# Patient Record
Sex: Female | Born: 1950 | Race: Black or African American | Hispanic: No | Smoking: Former smoker
Health system: Southern US, Community
[De-identification: ages and names within clinical notes are randomized; demographics above are authoritative.]

## PROBLEM LIST (undated history)

## (undated) DIAGNOSIS — IMO0002 Reserved for concepts with insufficient information to code with codable children: Secondary | ICD-10-CM

## (undated) DIAGNOSIS — I10 Essential (primary) hypertension: Secondary | ICD-10-CM

## (undated) DIAGNOSIS — J439 Emphysema, unspecified: Secondary | ICD-10-CM

## (undated) DIAGNOSIS — E1165 Type 2 diabetes mellitus with hyperglycemia: Secondary | ICD-10-CM

## (undated) DIAGNOSIS — R32 Unspecified urinary incontinence: Secondary | ICD-10-CM

## (undated) DIAGNOSIS — E039 Hypothyroidism, unspecified: Secondary | ICD-10-CM

## (undated) DIAGNOSIS — J309 Allergic rhinitis, unspecified: Secondary | ICD-10-CM

## (undated) DIAGNOSIS — F329 Major depressive disorder, single episode, unspecified: Secondary | ICD-10-CM

## (undated) DIAGNOSIS — K219 Gastro-esophageal reflux disease without esophagitis: Secondary | ICD-10-CM

## (undated) DIAGNOSIS — F32A Depression, unspecified: Secondary | ICD-10-CM

## (undated) DIAGNOSIS — I639 Cerebral infarction, unspecified: Secondary | ICD-10-CM

## (undated) HISTORY — DX: Gastro-esophageal reflux disease without esophagitis: K21.9

## (undated) HISTORY — DX: Major depressive disorder, single episode, unspecified: F32.9

## (undated) HISTORY — DX: Emphysema, unspecified: J43.9

## (undated) HISTORY — DX: Depression, unspecified: F32.A

## (undated) HISTORY — DX: Unspecified urinary incontinence: R32

## (undated) HISTORY — DX: Allergic rhinitis, unspecified: J30.9

## (undated) HISTORY — DX: Hypothyroidism, unspecified: E03.9

## (undated) HISTORY — PX: ABDOMINAL HYSTERECTOMY: SHX81

---

## 1998-06-09 ENCOUNTER — Emergency Department (HOSPITAL_COMMUNITY): Admission: EM | Admit: 1998-06-09 | Discharge: 1998-06-09 | Payer: Self-pay | Admitting: Emergency Medicine

## 1999-03-06 ENCOUNTER — Observation Stay (HOSPITAL_COMMUNITY): Admission: AD | Admit: 1999-03-06 | Discharge: 1999-03-07 | Payer: Self-pay | Admitting: Obstetrics

## 1999-06-08 ENCOUNTER — Inpatient Hospital Stay (HOSPITAL_COMMUNITY): Admission: RE | Admit: 1999-06-08 | Discharge: 1999-06-10 | Payer: Self-pay | Admitting: Obstetrics & Gynecology

## 1999-06-17 ENCOUNTER — Encounter: Payer: Self-pay | Admitting: Obstetrics and Gynecology

## 1999-06-17 ENCOUNTER — Inpatient Hospital Stay (HOSPITAL_COMMUNITY): Admission: AD | Admit: 1999-06-17 | Discharge: 1999-06-25 | Payer: Self-pay | Admitting: Obstetrics and Gynecology

## 1999-06-18 ENCOUNTER — Encounter: Payer: Self-pay | Admitting: Obstetrics and Gynecology

## 1999-06-20 ENCOUNTER — Encounter: Payer: Self-pay | Admitting: Obstetrics & Gynecology

## 1999-06-21 ENCOUNTER — Encounter: Payer: Self-pay | Admitting: Obstetrics & Gynecology

## 1999-06-22 ENCOUNTER — Encounter: Payer: Self-pay | Admitting: Obstetrics and Gynecology

## 1999-06-23 ENCOUNTER — Encounter: Payer: Self-pay | Admitting: Obstetrics and Gynecology

## 1999-06-24 ENCOUNTER — Encounter: Payer: Self-pay | Admitting: Obstetrics and Gynecology

## 2004-02-26 ENCOUNTER — Emergency Department (HOSPITAL_COMMUNITY): Admission: EM | Admit: 2004-02-26 | Discharge: 2004-02-26 | Payer: Self-pay | Admitting: Emergency Medicine

## 2006-05-31 ENCOUNTER — Inpatient Hospital Stay (HOSPITAL_COMMUNITY): Admission: EM | Admit: 2006-05-31 | Discharge: 2006-06-01 | Payer: Self-pay | Admitting: Emergency Medicine

## 2013-10-09 ENCOUNTER — Inpatient Hospital Stay (HOSPITAL_BASED_OUTPATIENT_CLINIC_OR_DEPARTMENT_OTHER)
Admission: EM | Admit: 2013-10-09 | Discharge: 2013-10-12 | DRG: 190 | Disposition: A | Discharge status: Home / Self Care | Payer: Medicare Other | Attending: Internal Medicine | Admitting: Internal Medicine

## 2013-10-09 ENCOUNTER — Emergency Department (HOSPITAL_BASED_OUTPATIENT_CLINIC_OR_DEPARTMENT_OTHER): Payer: Medicare Other

## 2013-10-09 ENCOUNTER — Encounter (HOSPITAL_BASED_OUTPATIENT_CLINIC_OR_DEPARTMENT_OTHER): Payer: Self-pay | Admitting: Emergency Medicine

## 2013-10-09 DIAGNOSIS — T380X5A Adverse effect of glucocorticoids and synthetic analogues, initial encounter: Secondary | ICD-10-CM | POA: Diagnosis present

## 2013-10-09 DIAGNOSIS — N179 Acute kidney failure, unspecified: Secondary | ICD-10-CM | POA: Diagnosis present

## 2013-10-09 DIAGNOSIS — Z79899 Other long term (current) drug therapy: Secondary | ICD-10-CM

## 2013-10-09 DIAGNOSIS — J441 Chronic obstructive pulmonary disease with (acute) exacerbation: Secondary | ICD-10-CM

## 2013-10-09 DIAGNOSIS — Z6841 Body Mass Index (BMI) 40.0 and over, adult: Secondary | ICD-10-CM

## 2013-10-09 DIAGNOSIS — R7309 Other abnormal glucose: Secondary | ICD-10-CM | POA: Diagnosis present

## 2013-10-09 DIAGNOSIS — I10 Essential (primary) hypertension: Secondary | ICD-10-CM | POA: Diagnosis present

## 2013-10-09 DIAGNOSIS — Z8249 Family history of ischemic heart disease and other diseases of the circulatory system: Secondary | ICD-10-CM

## 2013-10-09 DIAGNOSIS — E662 Morbid (severe) obesity with alveolar hypoventilation: Secondary | ICD-10-CM | POA: Diagnosis present

## 2013-10-09 DIAGNOSIS — R0902 Hypoxemia: Secondary | ICD-10-CM | POA: Diagnosis present

## 2013-10-09 DIAGNOSIS — J40 Bronchitis, not specified as acute or chronic: Secondary | ICD-10-CM | POA: Diagnosis present

## 2013-10-09 DIAGNOSIS — E876 Hypokalemia: Secondary | ICD-10-CM | POA: Diagnosis present

## 2013-10-09 DIAGNOSIS — J9601 Acute respiratory failure with hypoxia: Secondary | ICD-10-CM | POA: Diagnosis present

## 2013-10-09 DIAGNOSIS — T46905A Adverse effect of unspecified agents primarily affecting the cardiovascular system, initial encounter: Secondary | ICD-10-CM | POA: Diagnosis present

## 2013-10-09 DIAGNOSIS — J440 Chronic obstructive pulmonary disease with acute lower respiratory infection: Principal | ICD-10-CM | POA: Diagnosis present

## 2013-10-09 DIAGNOSIS — J9600 Acute respiratory failure, unspecified whether with hypoxia or hypercapnia: Secondary | ICD-10-CM | POA: Diagnosis present

## 2013-10-09 DIAGNOSIS — Z87891 Personal history of nicotine dependence: Secondary | ICD-10-CM

## 2013-10-09 DIAGNOSIS — J209 Acute bronchitis, unspecified: Principal | ICD-10-CM | POA: Diagnosis present

## 2013-10-09 HISTORY — DX: Essential (primary) hypertension: I10

## 2013-10-09 LAB — CBC WITH DIFFERENTIAL/PLATELET
BASOS ABS: 0 10*3/uL (ref 0.0–0.1)
Basophils Absolute: 0 10*3/uL (ref 0.0–0.1)
Basophils Relative: 0 % (ref 0–1)
Basophils Relative: 0 % (ref 0–1)
EOS ABS: 0.1 10*3/uL (ref 0.0–0.7)
EOS PCT: 2 % (ref 0–5)
Eosinophils Absolute: 0 10*3/uL (ref 0.0–0.7)
Eosinophils Relative: 0 % (ref 0–5)
HCT: 45.8 % (ref 36.0–46.0)
HEMATOCRIT: 45.3 % (ref 36.0–46.0)
Hemoglobin: 15.6 g/dL — ABNORMAL HIGH (ref 12.0–15.0)
Hemoglobin: 15.9 g/dL — ABNORMAL HIGH (ref 12.0–15.0)
LYMPHS PCT: 35 % (ref 12–46)
LYMPHS PCT: 23 % (ref 12–46)
Lymphs Abs: 2.8 10*3/uL (ref 0.7–4.0)
Lymphs Abs: 1.8 10*3/uL (ref 0.7–4.0)
MCH: 34.1 pg — AB (ref 26.0–34.0)
MCH: 35 pg — ABNORMAL HIGH (ref 26.0–34.0)
MCHC: 34.1 g/dL (ref 30.0–36.0)
MCHC: 35.1 g/dL (ref 30.0–36.0)
MCV: 100.2 fL — AB (ref 78.0–100.0)
MCV: 99.8 fL (ref 78.0–100.0)
Monocytes Absolute: 0.6 10*3/uL (ref 0.1–1.0)
Monocytes Absolute: 0 10*3/uL — ABNORMAL LOW (ref 0.1–1.0)
Monocytes Relative: 7 % (ref 3–12)
Monocytes Relative: 0 % — ABNORMAL LOW (ref 3–12)
NEUTROS ABS: 5.9 10*3/uL (ref 1.7–7.7)
NEUTROS PCT: 56 % (ref 43–77)
Neutro Abs: 4.4 10*3/uL (ref 1.7–7.7)
Neutrophils Relative %: 76 % (ref 43–77)
PLATELETS: 166 10*3/uL (ref 150–400)
PLATELETS: 182 10*3/uL (ref 150–400)
RBC: 4.57 MIL/uL (ref 3.87–5.11)
RBC: 4.54 MIL/uL (ref 3.87–5.11)
RDW: 12.7 % (ref 11.5–15.5)
RDW: 13.4 % (ref 11.5–15.5)
WBC: 8 10*3/uL (ref 4.0–10.5)
WBC: 7.7 10*3/uL (ref 4.0–10.5)

## 2013-10-09 LAB — BASIC METABOLIC PANEL
BUN: 10 mg/dL (ref 6–23)
CALCIUM: 8.9 mg/dL (ref 8.4–10.5)
CO2: 29 mEq/L (ref 19–32)
Chloride: 98 mEq/L (ref 96–112)
Creatinine, Ser: 1.2 mg/dL — ABNORMAL HIGH (ref 0.50–1.10)
GFR calc Af Amer: 55 mL/min — ABNORMAL LOW (ref >90)
GFR, EST NON AFRICAN AMERICAN: 48 mL/min — AB (ref >90)
Glucose, Bld: 110 mg/dL — ABNORMAL HIGH (ref 70–99)
POTASSIUM: 3.4 meq/L — AB (ref 3.7–5.3)
SODIUM: 140 meq/L (ref 137–147)

## 2013-10-09 LAB — COMPREHENSIVE METABOLIC PANEL
ALT: 24 U/L (ref 0–35)
AST: 46 U/L — AB (ref 0–37)
Albumin: 3.2 g/dL — ABNORMAL LOW (ref 3.5–5.2)
Alkaline Phosphatase: 88 U/L (ref 39–117)
BUN: 12 mg/dL (ref 6–23)
CALCIUM: 8.6 mg/dL (ref 8.4–10.5)
CO2: 25 meq/L (ref 19–32)
CREATININE: 1.1 mg/dL (ref 0.50–1.10)
Chloride: 95 mEq/L — ABNORMAL LOW (ref 96–112)
GFR, EST AFRICAN AMERICAN: 61 mL/min — AB (ref >90)
GFR, EST NON AFRICAN AMERICAN: 53 mL/min — AB (ref >90)
Glucose, Bld: 229 mg/dL — ABNORMAL HIGH (ref 70–99)
Potassium: 3.3 mEq/L — ABNORMAL LOW (ref 3.7–5.3)
Sodium: 135 mEq/L — ABNORMAL LOW (ref 137–147)
TOTAL PROTEIN: 9.4 g/dL — AB (ref 6.0–8.3)
Total Bilirubin: 0.7 mg/dL (ref 0.3–1.2)

## 2013-10-09 LAB — MAGNESIUM: Magnesium: 1.4 mg/dL — ABNORMAL LOW (ref 1.5–2.5)

## 2013-10-09 LAB — TROPONIN I

## 2013-10-09 LAB — APTT: APTT: 31 s (ref 24–37)

## 2013-10-09 LAB — PROTIME-INR
INR: 1.15 (ref 0.00–1.49)
Prothrombin Time: 14.5 seconds (ref 11.6–15.2)

## 2013-10-09 LAB — PHOSPHORUS: PHOSPHORUS: 1.6 mg/dL — AB (ref 2.3–4.6)

## 2013-10-09 LAB — PRO B NATRIURETIC PEPTIDE: PRO B NATRI PEPTIDE: 67.2 pg/mL (ref 0–125)

## 2013-10-09 MED ORDER — DEXTROSE 5 % IV SOLN
1.0000 g | Freq: Once | INTRAVENOUS | Status: AC
  Administered 2013-10-09: 1 g via INTRAVENOUS

## 2013-10-09 MED ORDER — DEXTROSE 5 % IV SOLN
500.0000 mg | INTRAVENOUS | Status: DC
  Administered 2013-10-09 – 2013-10-10 (×2): 500 mg via INTRAVENOUS
  Filled 2013-10-09 (×3): qty 500

## 2013-10-09 MED ORDER — ALBUTEROL SULFATE (2.5 MG/3ML) 0.083% IN NEBU: 5.0000 mg | INHALATION_SOLUTION | Freq: Once | RESPIRATORY_TRACT | Status: DC

## 2013-10-09 MED ORDER — METHYLPREDNISOLONE SODIUM SUCC 125 MG IJ SOLR
60.0000 mg | Freq: Every day | INTRAMUSCULAR | Status: DC
  Administered 2013-10-09 – 2013-10-10 (×2): 60 mg via INTRAVENOUS
  Filled 2013-10-09 (×2): qty 0.96

## 2013-10-09 MED ORDER — HYDROCODONE-ACETAMINOPHEN 5-325 MG PO TABS: 1.0000 | ORAL_TABLET | ORAL | Status: DC | PRN

## 2013-10-09 MED ORDER — IPRATROPIUM BROMIDE 0.02 % IN SOLN
0.5000 mg | Freq: Four times a day (QID) | RESPIRATORY_TRACT | Status: DC
  Administered 2013-10-09: 0.5 mg via RESPIRATORY_TRACT
  Filled 2013-10-09: qty 2.5

## 2013-10-09 MED ORDER — IPRATROPIUM BROMIDE 0.02 % IN SOLN: 0.5000 mg | RESPIRATORY_TRACT | Status: DC | PRN

## 2013-10-09 MED ORDER — ACETAMINOPHEN 650 MG RE SUPP: 650.0000 mg | Freq: Four times a day (QID) | RECTAL | Status: DC | PRN

## 2013-10-09 MED ORDER — ONDANSETRON HCL 4 MG/2ML IJ SOLN: 4.0000 mg | Freq: Four times a day (QID) | INTRAMUSCULAR | Status: DC | PRN

## 2013-10-09 MED ORDER — GUAIFENESIN 100 MG/5ML PO SYRP
300.0000 mg | ORAL_SOLUTION | Freq: Every day | ORAL | Status: DC | PRN
  Administered 2013-10-09 – 2013-10-10 (×2): 300 mg via ORAL
  Filled 2013-10-09 (×3): qty 15

## 2013-10-09 MED ORDER — OSELTAMIVIR PHOSPHATE 75 MG PO CAPS
75.0000 mg | ORAL_CAPSULE | Freq: Once | ORAL | Status: AC
  Administered 2013-10-09: 75 mg via ORAL
  Filled 2013-10-09: qty 1

## 2013-10-09 MED ORDER — ZOLPIDEM TARTRATE 5 MG PO TABS
5.0000 mg | ORAL_TABLET | Freq: Every day | ORAL | Status: DC
  Administered 2013-10-09 – 2013-10-11 (×3): 5 mg via ORAL
  Filled 2013-10-09 (×3): qty 1

## 2013-10-09 MED ORDER — IPRATROPIUM-ALBUTEROL 0.5-2.5 (3) MG/3ML IN SOLN
3.0000 mL | Freq: Three times a day (TID) | RESPIRATORY_TRACT | Status: DC
  Administered 2013-10-10 – 2013-10-11 (×3): 3 mL via RESPIRATORY_TRACT
  Filled 2013-10-09 (×6): qty 3

## 2013-10-09 MED ORDER — AZITHROMYCIN 250 MG PO TABS
1000.0000 mg | ORAL_TABLET | Freq: Once | ORAL | Status: AC
  Administered 2013-10-09: 1000 mg via ORAL
  Filled 2013-10-09: qty 4

## 2013-10-09 MED ORDER — ALBUTEROL SULFATE (2.5 MG/3ML) 0.083% IN NEBU
2.5000 mg | INHALATION_SOLUTION | Freq: Four times a day (QID) | RESPIRATORY_TRACT | Status: DC
  Administered 2013-10-09: 2.5 mg via RESPIRATORY_TRACT
  Filled 2013-10-09: qty 3

## 2013-10-09 MED ORDER — SODIUM CHLORIDE 0.9 % IJ SOLN
3.0000 mL | Freq: Two times a day (BID) | INTRAMUSCULAR | Status: DC
  Administered 2013-10-10 – 2013-10-11 (×3): 3 mL via INTRAVENOUS

## 2013-10-09 MED ORDER — PREDNISONE 50 MG PO TABS: 60.0000 mg | ORAL_TABLET | Freq: Once | ORAL | Status: DC

## 2013-10-09 MED ORDER — SODIUM CHLORIDE 0.9 % IV SOLN
INTRAVENOUS | Status: AC
  Administered 2013-10-09: 75 mL/h via INTRAVENOUS

## 2013-10-09 MED ORDER — GUAIFENESIN ER 600 MG PO TB12
600.0000 mg | ORAL_TABLET | Freq: Two times a day (BID) | ORAL | Status: DC
  Administered 2013-10-09 – 2013-10-12 (×6): 600 mg via ORAL
  Filled 2013-10-09 (×7): qty 1

## 2013-10-09 MED ORDER — ALPRAZOLAM 0.5 MG PO TABS
2.0000 mg | ORAL_TABLET | Freq: Every day | ORAL | Status: DC
  Administered 2013-10-09 – 2013-10-11 (×3): 2 mg via ORAL
  Filled 2013-10-09 (×3): qty 4

## 2013-10-09 MED ORDER — ACETAMINOPHEN 325 MG PO TABS: 650.0000 mg | ORAL_TABLET | Freq: Four times a day (QID) | ORAL | Status: DC | PRN

## 2013-10-09 MED ORDER — ZOLPIDEM TARTRATE 5 MG PO TABS: 10.0000 mg | ORAL_TABLET | Freq: Every day | ORAL | Status: DC

## 2013-10-09 MED ORDER — PREDNISONE 50 MG PO TABS
60.0000 mg | ORAL_TABLET | Freq: Once | ORAL | Status: AC
  Administered 2013-10-09: 60 mg via ORAL
  Filled 2013-10-09 (×2): qty 1

## 2013-10-09 MED ORDER — METOPROLOL SUCCINATE ER 100 MG PO TB24
100.0000 mg | ORAL_TABLET | Freq: Every morning | ORAL | Status: DC
  Administered 2013-10-10 – 2013-10-12 (×3): 100 mg via ORAL
  Filled 2013-10-09 (×3): qty 1

## 2013-10-09 MED ORDER — MORPHINE SULFATE 2 MG/ML IJ SOLN: 1.0000 mg | INTRAMUSCULAR | Status: DC | PRN

## 2013-10-09 MED ORDER — BIOTENE DRY MOUTH MT LIQD
15.0000 mL | Freq: Two times a day (BID) | OROMUCOSAL | Status: DC
  Administered 2013-10-09 – 2013-10-12 (×3): 15 mL via OROMUCOSAL

## 2013-10-09 MED ORDER — OSELTAMIVIR PHOSPHATE 75 MG PO CAPS
75.0000 mg | ORAL_CAPSULE | Freq: Two times a day (BID) | ORAL | Status: DC
  Administered 2013-10-09 – 2013-10-10 (×2): 75 mg via ORAL
  Filled 2013-10-09 (×4): qty 1

## 2013-10-09 MED ORDER — ALBUTEROL SULFATE (2.5 MG/3ML) 0.083% IN NEBU
5.0000 mg | INHALATION_SOLUTION | Freq: Once | RESPIRATORY_TRACT | Status: AC
  Administered 2013-10-09: 5 mg via RESPIRATORY_TRACT
  Filled 2013-10-09: qty 6

## 2013-10-09 MED ORDER — ALBUTEROL SULFATE (2.5 MG/3ML) 0.083% IN NEBU
2.5000 mg | INHALATION_SOLUTION | RESPIRATORY_TRACT | Status: DC | PRN
Start: 2013-10-09 — End: 2013-10-12
  Administered 2013-10-12: 2.5 mg via RESPIRATORY_TRACT
  Filled 2013-10-09: qty 3

## 2013-10-09 MED ORDER — DEXTROSE 5 % IV SOLN
1.0000 g | INTRAVENOUS | Status: DC
  Administered 2013-10-10 – 2013-10-11 (×2): 1 g via INTRAVENOUS
  Filled 2013-10-09 (×4): qty 10

## 2013-10-09 MED ORDER — ONDANSETRON HCL 4 MG PO TABS: 4.0000 mg | ORAL_TABLET | Freq: Four times a day (QID) | ORAL | Status: DC | PRN

## 2013-10-09 MED ORDER — CEFTRIAXONE SODIUM 1 G IJ SOLR
INTRAMUSCULAR | Status: AC
  Filled 2013-10-09: qty 10

## 2013-10-09 NOTE — ED Notes (Signed)
Patient transported to X-ray 

## 2013-10-09 NOTE — ED Notes (Signed)
Pt amb to room 5 with quick steady gait in nad. Initial room air sats in mid 80's, rt at bedside, placed on o2 at 2lpm, sats increase to 99%. Pt states she has had productive cough x 3 weeks, "it seemed to get better, then came back with a vengeance".

## 2013-10-09 NOTE — ED Provider Notes (Signed)
CSN: [id number]     Arrival date & time 10/09/13  1039 History   First MD Initiated Contact with Patient 10/09/13 1057     Chief Complaint  Patient presents with  . Cough  . Shortness of Breath   (Consider location/radiation/quality/duration/timing/severity/associated sxs/prior Treatment) Patient is a 63 y.o. female presenting with cough and shortness of breath.  Cough Associated symptoms: shortness of breath   Shortness of Breath Associated symptoms: cough    Pt with history of COPD, but only using a rescue inhaler in general reports 3 weeks of URI symptoms that initially improved but then worsened 3-4 days ago, now with cough productive of thick green sputum, subjective fevers and SOB. Her symptoms are worse at night and with walking. Denies CP, has not been to PCP for same. Multiple other family members have had similar symptoms recently but hers have been the worst.   Past Medical History  Diagnosis Date  . Hypertension   . Emphysema/COPD    History reviewed. No pertinent past surgical history. History reviewed. No pertinent family history. History  Substance Use Topics  . Smoking status: Former Research scientist (life sciences)  . Smokeless tobacco: Not on file  . Alcohol Use: Not on file   OB History   Grav Para Term Preterm Abortions TAB SAB Ect Mult Living                 Review of Systems  Respiratory: Positive for cough and shortness of breath.    All other systems reviewed and are negative except as noted in HPI.   Allergies  Review of patient's allergies indicates no known allergies.  Home Medications   Current Outpatient Rx  Name  Route  Sig  Dispense  Refill  . ALPRAZolam (XANAX) 1 MG tablet   Oral   Take 1 mg by mouth 2 (two) times daily as needed for anxiety.         Marland Kitchen lisinopril-hydrochlorothiazide (PRINZIDE,ZESTORETIC) 10-12.5 MG per tablet   Oral   Take 1 tablet by mouth daily.         . metoprolol succinate (TOPROL-XL) 50 MG 24 hr tablet   Oral   Take 50 mg by  mouth daily. Take with or immediately following a meal.         . zolpidem (AMBIEN) 10 MG tablet   Oral   Take 10 mg by mouth at bedtime as needed for sleep.          BP 150/91  Pulse 79  Temp(Src) 99.5 F (37.5 C) (Oral)  Resp 22  Ht 5\' 3"  (1.6 m)  Wt 290 lb (131.543 kg)  BMI 51.38 kg/m2  SpO2 97% Physical Exam  Nursing note and vitals reviewed. Constitutional: She is oriented to person, place, and time. She appears well-developed and well-nourished.  HENT:  Head: Normocephalic and atraumatic.  Eyes: EOM are normal. Pupils are equal, round, and reactive to light.  Neck: Normal range of motion. Neck supple.  Cardiovascular: Normal rate, normal heart sounds and intact distal pulses.   Pulmonary/Chest: Effort normal and breath sounds normal. No respiratory distress. She has no wheezes. She has no rales.  Diminished lung sounds  Abdominal: Bowel sounds are normal. She exhibits no distension. There is no tenderness.  Musculoskeletal: Normal range of motion. She exhibits no edema and no tenderness.  Neurological: She is alert and oriented to person, place, and time. She has normal strength. No cranial nerve deficit or sensory deficit.  Skin: Skin is warm and dry. No  rash noted.  Psychiatric: She has a normal mood and affect.    ED Course  Procedures (including critical care time) Labs Review Labs Reviewed  CBC WITH DIFFERENTIAL - Abnormal; Notable for the following:    Hemoglobin 15.6 (*)    MCV 100.2 (*)    MCH 34.1 (*)    All other components within normal limits  BASIC METABOLIC PANEL - Abnormal; Notable for the following:    Potassium 3.4 (*)    Glucose, Bld 110 (*)    Creatinine, Ser 1.20 (*)    GFR calc non Af Amer 48 (*)    GFR calc Af Amer 55 (*)    All other components within normal limits  TROPONIN I  PRO B NATRIURETIC PEPTIDE  INFLUENZA PANEL BY PCR (TYPE A & B, H1N1)   Imaging Review Dg Chest 2 View  10/09/2013   CLINICAL DATA:  SOB, cough, hypoxia   EXAM: CHEST  2 VIEW  COMPARISON:  DG CHEST 1V PORT dated 05/31/2006  FINDINGS: The heart size and mediastinal contours are within normal limits. Both lungs are clear. The visualized skeletal structures are unremarkable.  IMPRESSION: No active cardiopulmonary disease.   Electronically Signed   By: Kathreen Devoid   On: 10/09/2013 11:31    EKG Interpretation    Date/Time:  Wednesday October 09 2013 11:08:53 EST Ventricular Rate:  80 PR Interval:  196 QRS Duration: 86 QT Interval:  404 QTC Calculation: 465 R Axis:   73 Text Interpretation:  Sinus rhythm with frequent Premature ventricular complexes Otherwise normal ECG Since last tracing Premature ventricular complexes NOW PRESENT T wave inversion improved  Confirmed by Darol Cush  MD, Malayiah Mcbrayer ([id number]) on 10/09/2013 11:50:59 AM            MDM   1. Bronchitis   2. Hypoxia     Pt with continued hypoxia despite essentially negative labs and imaging. Given a neb and prednisone, will treat empirically with Abx, admit for further eval.     Kewana Sanon B. Karle Starch, MD 10/09/13 1517

## 2013-10-09 NOTE — Progress Notes (Signed)
ANTIBIOTIC CONSULT NOTE - INITIAL  Pharmacy Consult for Antibiotic renal dose adjustment.   Indication: Pneumonia   No Known Allergies  Patient Measurements: Height: 5\' 3"  (160 cm) Weight: 287 lb 7.7 oz (130.4 kg) IBW/kg (Calculated) : 52.4  Vital Signs: Temp: 98.7 F (37.1 C) (01/28 1800) Temp src: Oral (01/28 1800) BP: 127/79 mmHg (01/28 1800) Pulse Rate: 79 (01/28 1800) Intake/Output from previous day:   Intake/Output from this shift:    Labs:  Recent Labs  10/09/13 1135  WBC 8.0  HGB 15.6*  PLT 166  CREATININE 1.20*   Estimated Creatinine Clearance: 65 ml/min (by C-G formula based on Cr of 1.2). No results found for this basename: VANCOTROUGH, VANCOPEAK, VANCORANDOM, GENTTROUGH, GENTPEAK, GENTRANDOM, TOBRATROUGH, TOBRAPEAK, TOBRARND, AMIKACINPEAK, AMIKACINTROU, AMIKACIN,  in the last 72 hours   Microbiology: No results found for this or any previous visit (from the past 720 hour(s)).  Medical History: Past Medical History  Diagnosis Date  . Hypertension   . Emphysema/COPD    Assessment: 81 YOF presented with worsening SOB and productive cough and started on azithromycin 500mg  IV Q 24 hrs and rocephin 1g IV Q 24 hrs x 7 days, Pharmacy is consulted for antibiotic renal dose adjustment.    Goal of Therapy:  Elimination of infection  Plan:  Continue azithromycin and rocephin as ordered, no renal dose adjustment required for either of the antibiotics Pharmacy sign off, please re-consult if other antibiotics are needed.  Thanks.  Maryanna Shape, PharmD, BCPS  Clinical Pharmacist  Pager: [phone]   10/09/2013,7:10 PM

## 2013-10-09 NOTE — H&P (Signed)
[facility] History and Physical  Sue [name] [id number] DOB: 1951/07/28 DOA: 10/09/2013  Referring physician: ED physician PCP: No PCP Per Patient   Chief Complaint: shortness of breath  HPI:  63 year old female with past medical history of hypertension, anxiety who presented to [facility] on 10/09/2013 with worsening shortness of breath and associated productive cough ongoing for past few weeks prior to this admission. Pt reported having copious amount of greenish sputum and additional subjective fevers at home. No associated chest pain, palpitations. No abdominal pain, nausea or vomiting. No lightheadedness or loss of consciousness.  In ED, BP was 127/79, HR 79, Tmax 99.5 F and oxygen saturation of 83% on room air. Oxygen saturation improved to 99% with 2 L [place] oxygen support. CBC unremarkable and BMP showed potassium of 3.4 and creatinine of 1.20. CXR did not show acute cardiopulmonary disease.  Assessment and Plan:  Principal Problem:   Acute respiratory failure with hypoxia - likely due to upper respiratory tract infection, viral or bacterial pneumonia - pneumonia order set in place - started azithromycin and rocephin - started tamiflu until influenza PCR results back - follow up blood culture results, legionella, strep pneumonia, resp culture results - oxygen support via nasal canula to keep O2 saturation above 90% - BD scheduled and as needed Active Problems:   Acute renal failure - likely due to prinizide which we held on admission - follow up BMP ina m   HTN (hypertension) - hold prinizide and continue metoprolol   Hypokalemia - repeat admission labs and replete as needed   Code Status: Full Family Communication: Pt at bedside Disposition Plan: PT evaluation    Review of Systems:  Constitutional: Negative for fever, chills and malaise/fatigue. Negative for diaphoresis.  HENT: Negative for hearing loss, ear pain, nosebleeds, congestion, sore throat, neck  pain, tinnitus and ear discharge.   Eyes: Negative for blurred vision, double vision, photophobia, pain, discharge and redness.  Respiratory: positive for cough, no hemoptysis, sputum production, positive for shortness of breath, wheezing and stridor.   Cardiovascular: Negative for chest pain, palpitations, orthopnea, claudication and leg swelling.  Gastrointestinal: Negative for nausea, vomiting and abdominal pain. Negative for heartburn, constipation, blood in stool and melena.  Genitourinary: Negative for dysuria, urgency, frequency, hematuria and flank pain.  Musculoskeletal: Negative for myalgias, back pain, joint pain and falls.  Skin: Negative for itching and rash.  Neurological: Negative for dizziness and weakness. Negative for tingling, tremors, sensory change, speech change, focal weakness, loss of consciousness and headaches.  Endo/Heme/Allergies: Negative for environmental allergies and polydipsia. Does not bruise/bleed easily.  Psychiatric/Behavioral: Negative for suicidal ideas. The patient is not nervous/anxious.      Past Medical History  Diagnosis Date  . Hypertension   . Emphysema/COPD     History reviewed. No pertinent past surgical history.  Social History:  reports that she has quit smoking. She does not have any smokeless tobacco history on file. Her alcohol and drug histories are not on file.  No Known Allergies  Family history: HTN in mother  Prior to Admission medications   Medication Sig Start Date End Date Taking? Authorizing Provider  ALPRAZolam Duanne Moron) 1 MG tablet Take 2 mg by mouth at bedtime.    Yes Historical Provider, MD  guaifenesin (ROBITUSSIN) 100 MG/5ML syrup Take 300 mg by mouth daily as needed for cough.   Yes Historical Provider, MD  lisinopril-hydrochlorothiazide (PRINZIDE,ZESTORETIC) 10-12.5 MG per tablet Take 2 tablets by mouth every morning.    Yes Historical Provider,  MD  metoprolol succinate (TOPROL-XL) 50 MG 24 hr tablet Take 100 mg by  mouth every morning. Take with or immediately following a meal.   Yes Historical Provider, MD  zolpidem (AMBIEN) 10 MG tablet Take 10 mg by mouth at bedtime.    Yes Historical Provider, MD    Physical Exam: Filed Vitals:   10/09/13 1320 10/09/13 1540 10/09/13 1728 10/09/13 1800  BP:  139/89 142/88 127/79  Pulse:  82 86 79  Temp:  99.4 F (37.4 C) 99 F (37.2 C) 98.7 F (37.1 C)  TempSrc:  Oral  Oral  Resp:  20 18 20   Height:    5\' 3"  (1.6 m)  Weight:    130.4 kg (287 lb 7.7 oz)  SpO2: 98% 99% 99% 96%    Physical Exam  Constitutional: Appears well-developed and well-nourished. No distress.  HENT: Normocephalic. External right and left ear normal.  Eyes: Conjunctivae and EOM are normal. PERRLA, no scleral icterus.  Neck: Normal ROM. Neck supple. No JVD. No tracheal deviation. No thyromegaly.  CVS: RRR, S1/S2 +, no murmurs, no gallops, no carotid bruit.  Pulmonary: normal inspiratory effort but diminished breath sounds, no wheezing  Abdominal: Soft. BS +,  no distension, tenderness, rebound or guarding.  Musculoskeletal: Normal range of motion. No edema and no tenderness.  Lymphadenopathy: No lymphadenopathy noted, cervical, inguinal. Neuro: Alert. Normal reflexes, muscle tone coordination. No cranial nerve deficit. Skin: Skin is warm and dry. No rash noted. Not diaphoretic. No erythema. No pallor.  Psychiatric: Normal mood and affect. Behavior, judgment, thought content normal.   Labs on Admission:  Basic Metabolic Panel:  Recent Labs Lab 10/09/13 1135  NA 140  K 3.4*  CL 98  CO2 29  GLUCOSE 110*  BUN 10  CREATININE 1.20*  CALCIUM 8.9   Liver Function Tests: No results found for this basename: AST, ALT, ALKPHOS, BILITOT, PROT, ALBUMIN,  in the last 168 hours No results found for this basename: LIPASE, AMYLASE,  in the last 168 hours No results found for this basename: AMMONIA,  in the last 168 hours CBC:  Recent Labs Lab 10/09/13 1135  WBC 8.0  NEUTROABS 4.4   HGB 15.6*  HCT 45.8  MCV 100.2*  PLT 166   Cardiac Enzymes:  Recent Labs Lab 10/09/13 1135  TROPONINI <0.30   BNP: No components found with this basename: POCBNP,  CBG: No results found for this basename: GLUCAP,  in the last 168 hours  Radiological Exams on Admission: Dg Chest 2 View 10/09/2013   IMPRESSION: No active cardiopulmonary disease.   Electronically Signed   By: Kathreen Devoid   On: 10/09/2013 11:31    EKG: Normal sinus rhythm  Ramsie Ramsay, MD  [facility] Pager [phone]  If 7PM-7AM, please contact night-coverage [email or web address] Password [facility] 10/09/2013, 6:32 PM

## 2013-10-09 NOTE — Progress Notes (Signed)
Candies Sejour [id number] Admission Data: 10/09/2013 6:14 PM Attending Provider: Thurnell Lose, MD  PCP:No PCP Per Patient Consults/ Treatment Team:    Sue [name] is a 63 y.o. female patient admitted from ED awake, alert  & orientated  X 3,  No Order, VSS - Blood pressure 142/88, pulse 86, temperature 99 F (37.2 C), temperature source Oral, resp. rate 18, height 5\' 3"  (1.6 m), weight 131.543 kg (290 lb), SpO2 99.00%., O2    2 L nasal cannular, no c/o shortness of breath, no c/o chest pain, no distress noted.  IV site WDL:  antecubital right, condition patent and no redness with a transparent dsg that's clean dry and intact.  Allergies:  No Known Allergies   Past Medical History  Diagnosis Date  . Hypertension   . Emphysema/COPD     History:  obtained from the patient. Tobacco/alcohol: Smoked 1 pack/day for 40 years, quit 1 year ago, drinks wine occasionally 2 glasses per week  Pt orientation to unit, room and routine. Information packet given to patient/family and safety video watched.  Admission INP armband ID verified with patient/family, and in place. SR up x 2, fall risk assessment complete with Patient and family verbalizing understanding of risks associated with falls. Pt verbalizes an understanding of how to use the call bell and to call for help before getting out of bed.  Skin, clean-dry- intact without evidence of bruising, or skin tears.   No evidence of skin break down noted on exam. no rashes    Will cont to monitor and assist as needed.  Shafiq Larch, Elissa Hefty, RN 10/09/2013 6:14 PM

## 2013-10-09 NOTE — ED Notes (Signed)
Pt ambulated in the [place].  HR 81, SPO2 96%.  After taking 50 steps SPO2 decreased to 84%, and HR 96, RR became labored. Placed pt back in the bed and placed back on 2lpm  Where Spo2 increased back to 96% and increased WOB decreased. NAD at this time. MD at bedside to discuss plan.

## 2013-10-09 NOTE — Progress Notes (Signed)
    Sue [name], is a 62 y.o. female, DOB - 1950-10-28, [id number] H/O COPD, coming from [facility], Dr Karle Starch referred for Ac. Rest Failure, URI Flu VS Bacterial, ok on 2lit [place] o2. No wheezing. Med Surg bed.     Filed Vitals:   10/09/13 1050 10/09/13 1053 10/09/13 1054 10/09/13 1320  BP: 150/91     Pulse: 79     Temp: 99.5 F (37.5 C)     TempSrc: Oral     Resp: 22     Height: 5\' 3"  (1.6 m)     Weight: 131.543 kg (290 lb)     SpO2: 83% 96% 97% 98%        Data Review   Micro Results No results found for this or any previous visit (from the past 240 hour(s)).  Radiology Reports Dg Chest 2 View  10/09/2013   CLINICAL DATA:  SOB, cough, hypoxia  EXAM: CHEST  2 VIEW  COMPARISON:  DG CHEST 1V PORT dated 05/31/2006  FINDINGS: The heart size and mediastinal contours are within normal limits. Both lungs are clear. The visualized skeletal structures are unremarkable.  IMPRESSION: No active cardiopulmonary disease.   Electronically Signed   By: Kathreen Devoid   On: 10/09/2013 11:31    CBC  Recent Labs Lab 10/09/13 1135  WBC 8.0  HGB 15.6*  HCT 45.8  PLT 166  MCV 100.2*  MCH 34.1*  MCHC 34.1  RDW 12.7  LYMPHSABS 2.8  MONOABS 0.6  EOSABS 0.1  BASOSABS 0.0    Chemistries   Recent Labs Lab 10/09/13 1135  NA 140  K 3.4*  CL 98  CO2 29  GLUCOSE 110*  BUN 10  CREATININE 1.20*  CALCIUM 8.9   ------------------------------------------------------------------------------------------------------------------ estimated creatinine clearance is 65.3 ml/min (by C-G formula based on Cr of 1.2). ------------------------------------------------------------------------------------------------------------------ No results found for this basename: HGBA1C,  in the last 72 hours ------------------------------------------------------------------------------------------------------------------ No results found for this basename: CHOL, HDL, LDLCALC, TRIG, CHOLHDL, LDLDIRECT,  in the last 72  hours ------------------------------------------------------------------------------------------------------------------ No results found for this basename: TSH, T4TOTAL, FREET3, T3FREE, THYROIDAB,  in the last 72 hours ------------------------------------------------------------------------------------------------------------------ No results found for this basename: VITAMINB12, FOLATE, FERRITIN, TIBC, IRON, RETICCTPCT,  in the last 72 hours  Coagulation profile No results found for this basename: INR, PROTIME,  in the last 168 hours  No results found for this basename: DDIMER,  in the last 72 hours  Cardiac Enzymes  Recent Labs Lab 10/09/13 1135  TROPONINI <0.30   ------------------------------------------------------------------------------------------------------------------ No components found with this basename: POCBNP,

## 2013-10-10 DIAGNOSIS — J40 Bronchitis, not specified as acute or chronic: Secondary | ICD-10-CM

## 2013-10-10 DIAGNOSIS — J441 Chronic obstructive pulmonary disease with (acute) exacerbation: Secondary | ICD-10-CM

## 2013-10-10 LAB — COMPREHENSIVE METABOLIC PANEL
ALT: 21 U/L (ref 0–35)
AST: 36 U/L (ref 0–37)
Albumin: 3 g/dL — ABNORMAL LOW (ref 3.5–5.2)
Alkaline Phosphatase: 86 U/L (ref 39–117)
BUN: 19 mg/dL (ref 6–23)
CALCIUM: 8.4 mg/dL (ref 8.4–10.5)
CO2: 24 mEq/L (ref 19–32)
Chloride: 98 mEq/L (ref 96–112)
Creatinine, Ser: 1.11 mg/dL — ABNORMAL HIGH (ref 0.50–1.10)
GFR calc non Af Amer: 52 mL/min — ABNORMAL LOW (ref >90)
GFR, EST AFRICAN AMERICAN: 61 mL/min — AB (ref >90)
GLUCOSE: 164 mg/dL — AB (ref 70–99)
Potassium: 3.6 mEq/L — ABNORMAL LOW (ref 3.7–5.3)
SODIUM: 136 meq/L — AB (ref 137–147)
TOTAL PROTEIN: 8.7 g/dL — AB (ref 6.0–8.3)
Total Bilirubin: 0.6 mg/dL (ref 0.3–1.2)

## 2013-10-10 LAB — INFLUENZA PANEL BY PCR (TYPE A & B)
H1N1 flu by pcr: NOT DETECTED
INFLAPCR: NEGATIVE
Influenza B By PCR: NEGATIVE

## 2013-10-10 LAB — EXPECTORATED SPUTUM ASSESSMENT W REFEX TO RESP CULTURE

## 2013-10-10 LAB — CBC
HEMATOCRIT: 44.3 % (ref 36.0–46.0)
Hemoglobin: 15.3 g/dL — ABNORMAL HIGH (ref 12.0–15.0)
MCH: 34.6 pg — ABNORMAL HIGH (ref 26.0–34.0)
MCHC: 34.5 g/dL (ref 30.0–36.0)
MCV: 100.2 fL — ABNORMAL HIGH (ref 78.0–100.0)
Platelets: 174 10*3/uL (ref 150–400)
RBC: 4.42 MIL/uL (ref 3.87–5.11)
RDW: 13.5 % (ref 11.5–15.5)
WBC: 12.6 10*3/uL — ABNORMAL HIGH (ref 4.0–10.5)

## 2013-10-10 LAB — URINE CULTURE
Colony Count: NO GROWTH
Culture: NO GROWTH

## 2013-10-10 LAB — LEGIONELLA ANTIGEN, URINE: Legionella Antigen, Urine: NEGATIVE

## 2013-10-10 LAB — STREP PNEUMONIAE URINARY ANTIGEN: Strep Pneumo Urinary Antigen: NEGATIVE

## 2013-10-10 LAB — TSH: TSH: 1.572 u[IU]/mL (ref 0.350–4.500)

## 2013-10-10 MED ORDER — IPRATROPIUM-ALBUTEROL 0.5-2.5 (3) MG/3ML IN SOLN
3.0000 mL | Freq: Four times a day (QID) | RESPIRATORY_TRACT | Status: DC
  Administered 2013-10-10 (×2): 3 mL via RESPIRATORY_TRACT

## 2013-10-10 MED ORDER — HEPARIN SODIUM (PORCINE) 5000 UNIT/ML IJ SOLN
5000.0000 [IU] | Freq: Three times a day (TID) | INTRAMUSCULAR | Status: DC
  Administered 2013-10-10 (×2): 5000 [IU] via SUBCUTANEOUS
  Filled 2013-10-10 (×9): qty 1

## 2013-10-10 MED ORDER — PREDNISONE 50 MG PO TABS
50.0000 mg | ORAL_TABLET | Freq: Every day | ORAL | Status: DC
  Administered 2013-10-11 – 2013-10-12 (×2): 50 mg via ORAL
  Filled 2013-10-10 (×3): qty 1

## 2013-10-10 MED ORDER — MOMETASONE FURO-FORMOTEROL FUM 200-5 MCG/ACT IN AERO
2.0000 | INHALATION_SPRAY | Freq: Two times a day (BID) | RESPIRATORY_TRACT | Status: DC
  Administered 2013-10-10 – 2013-10-11 (×3): 2 via RESPIRATORY_TRACT
  Filled 2013-10-10: qty 8.8

## 2013-10-10 MED ORDER — POTASSIUM CHLORIDE CRYS ER 20 MEQ PO TBCR
40.0000 meq | EXTENDED_RELEASE_TABLET | Freq: Once | ORAL | Status: AC
  Administered 2013-10-10: 40 meq via ORAL
  Filled 2013-10-10: qty 2

## 2013-10-10 MED ORDER — SODIUM CHLORIDE 0.9 % IV SOLN: INTRAVENOUS | Status: DC

## 2013-10-10 NOTE — Progress Notes (Signed)
[facility] PROGRESS NOTE  Anahid Muser [id number] DOB: 01-28-1951 DOA: 10/09/2013 PCP: Zollie Pee, MD  HPI/Subjective: Sue [name] is a 63 y/o female with a PMH of COPD, HTN, and anxiety who presented to the ED with SOB and productive cough (green sputum) x 3 weeks; worsening in past 3-4 days. . She was admitted for hypoxia that is likely secondary to PNA/URI/Influenza as patient has had many sick contacts. Currently patient still is requiring 2L of O2 for SOB and coughing up sputum. She however feels much better than on admission  Assessment/Plan:  Principal Problem:  Acute respiratory failure with hypoxia  -Secondary to COPD exacerbation with bronchitis - Some improvement and then on admission -Continue empiric Azithromycin and Rocephin; day 2  -Will progress from IV Solumedrol to prednisone in the morning (1/30) -Influenza PCR is negative.  blood cultures pending -Patient required oxygen initially.  Will attempt to wean.  -Ambulate pt with pulse ox -Sx treatment with Albuterol Nebs, Dulera, Mucinex, and Robitussin   Active Problems:   Dyspnea on Exertion -over a period of months.  Reports she is unable to walk 20 steps without being SOB. - Suspect this to be multifactorial from underlying COPD, obesity hypoventilation syndrome -attempting to get records from PCP. -Will check a 2D echo.  Acute renal failure  -Likely due to Lisinopril/HCTZ which is being held  -Resolved -Cr down to 1.11 on 1/29 from 1.2 on admission  HTN (hypertension)  -Stable -Held Lisinopril/HCTZ  -Continue Metoprolol 100mg  Q day  Hypokalemia  -K 3.6 on 1/29; up from 3.3 on 1/28 -Gave 39meq of K on 1/29 -Continue to monitor with BMET in am   Hyperglycemia -likely due to steroids -Hgb A1c in am.  Morbid obesity - Counseled regarding the importance of weight loss  DVT Prophylaxis:  Heparin  Code Status: Full  Family Communication: No family at the beside this  morning Disposition Plan: Remain inpatient.  Likely d/c home on 1/30.   Consultants:  None   Procedures:  2D Echocardiogram with contrast  Antibiotics:  Azithromax   Rocephin  Objective: Filed Vitals:   10/09/13 1958 10/09/13 2201 10/10/13 0515 10/10/13 1100  BP:  109/80 116/67   Pulse:  90 81   Temp:  97.7 F (36.5 C) 97.8 F (36.6 C)   TempSrc:  Oral Oral   Resp:  22 22   Height:      Weight:   132 kg (291 lb 0.1 oz)   SpO2: 96% 94% 95% 94%    Intake/Output Summary (Last 24 hours) at 10/10/13 1219 Last data filed at 10/09/13 1939  Gross per 24 hour  Intake     50 ml  Output      0 ml  Net     50 ml   Filed Weights   10/09/13 1050 10/09/13 1800 10/10/13 0515  Weight: 131.543 kg (290 lb) 130.4 kg (287 lb 7.7 oz) 132 kg (291 lb 0.1 oz)   Exam: General: NAD, appears stated age, very pleasant.  HEENT:  Pharyngeal erythema but no exudates.  Neck is thick. Cardiovascular: RRR, S1 S2 auscultated, no rubs, murmurs or gallops.   Respiratory: Clear to auscultation bilaterally with equal chest rise; no wheezing.  Abdomen: Obese, Soft, nontender, nondistended, + bowel sounds  Extremities: warm dry without cyanosis or edema.  Neuro: AAOx3, cranial nerves grossly intact. Strength 5/5 in upper and lower extremities  Skin: Without rashes exudates or nodules.   Psych: Normal affect and demeanor with intact judgement and  insight  Data Reviewed: Basic Metabolic Panel:  Recent Labs Lab 10/09/13 1135 10/09/13 2058 10/10/13 0709  NA 140 135* 136*  K 3.4* 3.3* 3.6*  CL 98 95* 98  CO2 29 25 24   GLUCOSE 110* 229* 164*  BUN 10 12 19   CREATININE 1.20* 1.10 1.11*  CALCIUM 8.9 8.6 8.4  MG  --  1.4*  --   PHOS  --  1.6*  --    Liver Function Tests:  Recent Labs Lab 10/09/13 2058 10/10/13 0709  AST 46* 36  ALT 24 21  ALKPHOS 88 86  BILITOT 0.7 0.6  PROT 9.4* 8.7*  ALBUMIN 3.2* 3.0*   CBC:  Recent Labs Lab 10/09/13 1135 10/09/13 2058 10/10/13 0709  WBC  8.0 7.7 12.6*  NEUTROABS 4.4 5.9  --   HGB 15.6* 15.9* 15.3*  HCT 45.8 45.3 44.3  MCV 100.2* 99.8 100.2*  PLT 166 182 174   Cardiac Enzymes:  Recent Labs Lab 10/09/13 1135  TROPONINI <0.30   BNP (last 3 results)  Recent Labs  10/09/13 1135  PROBNP 67.2     Recent Results (from the past 240 hour(s))  CULTURE, EXPECTORATED SPUTUM-ASSESSMENT     Status: None   Collection Time    10/10/13  8:12 AM      Result Value Range Status   Specimen Description SPUTUM   Final   Special Requests NONE   Final   Sputum evaluation     Final   Value: MICROSCOPIC FINDINGS SUGGEST THAT THIS SPECIMEN IS NOT REPRESENTATIVE OF LOWER RESPIRATORY SECRETIONS. PLEASE RECOLLECT.     CALLED TO Q HOBLEY,RN 10/10/13 0915 BY K SCHULTZ   Report Status 10/10/2013 FINAL   Final    Studies: Dg Chest 2 View  10/09/2013   CLINICAL DATA:  SOB, cough, hypoxia  EXAM: CHEST  2 VIEW  COMPARISON:  DG CHEST 1V PORT dated 05/31/2006  FINDINGS: The heart size and mediastinal contours are within normal limits. Both lungs are clear. The visualized skeletal structures are unremarkable.  IMPRESSION: No active cardiopulmonary disease.   Electronically Signed   By: Kathreen Devoid   On: 10/09/2013 11:31    Scheduled Meds: . ALPRAZolam  2 mg Oral QHS  . antiseptic oral rinse  15 mL Mouth Rinse BID  . azithromycin  500 mg Intravenous Q24H  . cefTRIAXone (ROCEPHIN)  IV  1 g Intravenous Q24H  . guaiFENesin  600 mg Oral BID  . ipratropium-albuterol  3 mL Nebulization TID  . ipratropium-albuterol  3 mL Nebulization Q6H  . metoprolol succinate  100 mg Oral q morning - 10a  . mometasone-formoterol  2 puff Inhalation BID  . [START ON 10/11/2013] predniSONE  50 mg Oral Q breakfast  . sodium chloride  3 mL Intravenous Q12H  . zolpidem  5 mg Oral QHS   Principal Problem:   Acute respiratory failure with hypoxia Active Problems:   Acute renal failure   HTN (hypertension)   Hypokalemia  Lang Snow, PA-S Imogene Burn,  PA-C [facility] Pager [phone]. If 7PM-7AM, please contact night-coverage at [email or web address], password [facility] 10/10/2013, 12:19 PM  LOS: 1 day   Attending - Patient seen and examined, agree with the above assessment and plan. Morbidly obese female with a history of COPD, hypertension admitted for worsening shortness of breath and cough of 3 weeks duration. She claims that she developed a URI 3 weeks back after her grandson developed flulike symptoms. She got better for a few days however her symptoms continued to  worsen. She subsequently presented to med center [facility] with these complaints, and transferred to the hospitalist service. This morning, she claims that she feels better, continues to have a cough. At this time, it is presumed that she has acute bronchitis causing COPD exacerbation. Continue treating with antibiotics, IV Solu-Medrol, scheduled nebulized bronchodilators. Suspect baseline shortness of breath- mostly exertional dyspnea from obesity hypoventilation issues. Check echocardiogram. We'll try and obtain records from her PCPs office  S Ghimire

## 2013-10-10 NOTE — Progress Notes (Signed)
Pulsox assessment 91% resting, 84% after standing for 5 minutes. MD notified.

## 2013-10-10 NOTE — Progress Notes (Signed)
Pt's daughter, Wallie Renshaw [phone] called to check on pt. Pt gave RN permission to speak with daughter.

## 2013-10-11 DIAGNOSIS — R0602 Shortness of breath: Secondary | ICD-10-CM

## 2013-10-11 DIAGNOSIS — I517 Cardiomegaly: Secondary | ICD-10-CM

## 2013-10-11 LAB — BASIC METABOLIC PANEL
BUN: 19 mg/dL (ref 6–23)
CHLORIDE: 100 meq/L (ref 96–112)
CO2: 26 mEq/L (ref 19–32)
Calcium: 8 mg/dL — ABNORMAL LOW (ref 8.4–10.5)
Creatinine, Ser: 1.03 mg/dL (ref 0.50–1.10)
GFR calc non Af Amer: 57 mL/min — ABNORMAL LOW (ref >90)
GFR, EST AFRICAN AMERICAN: 67 mL/min — AB (ref >90)
GLUCOSE: 142 mg/dL — AB (ref 70–99)
POTASSIUM: 3.7 meq/L (ref 3.7–5.3)
Sodium: 139 mEq/L (ref 137–147)

## 2013-10-11 LAB — GLUCOSE, CAPILLARY
GLUCOSE-CAPILLARY: 118 mg/dL — AB (ref 70–99)
Glucose-Capillary: 194 mg/dL — ABNORMAL HIGH (ref 70–99)

## 2013-10-11 LAB — HEMOGLOBIN A1C
Hgb A1c MFr Bld: 6.1 % — ABNORMAL HIGH (ref <5.7)
Mean Plasma Glucose: 128 mg/dL — ABNORMAL HIGH (ref <117)

## 2013-10-11 LAB — PRO B NATRIURETIC PEPTIDE: Pro B Natriuretic peptide (BNP): 57 pg/mL (ref 0–125)

## 2013-10-11 MED ORDER — LEVOFLOXACIN 750 MG PO TABS: 750.0000 mg | ORAL_TABLET | Freq: Every day | ORAL | Status: DC

## 2013-10-11 MED ORDER — MOMETASONE FURO-FORMOTEROL FUM 200-5 MCG/ACT IN AERO: 2.0000 | INHALATION_SPRAY | Freq: Two times a day (BID) | RESPIRATORY_TRACT | Status: DC

## 2013-10-11 MED ORDER — MAGNESIUM SULFATE 40 MG/ML IJ SOLN
2.0000 g | Freq: Once | INTRAMUSCULAR | Status: AC
  Administered 2013-10-11: 2 g via INTRAVENOUS
  Filled 2013-10-11: qty 50

## 2013-10-11 MED ORDER — GUAIFENESIN ER 600 MG PO TB12: 600.0000 mg | ORAL_TABLET | Freq: Two times a day (BID) | ORAL | Status: DC

## 2013-10-11 MED ORDER — PREDNISONE 10 MG PO TABS: ORAL_TABLET | ORAL | Status: DC

## 2013-10-11 MED ORDER — AZITHROMYCIN 500 MG PO TABS
500.0000 mg | ORAL_TABLET | Freq: Every day | ORAL | Status: DC
  Administered 2013-10-11: 500 mg via ORAL
  Filled 2013-10-11 (×2): qty 1

## 2013-10-11 MED ORDER — MAGNESIUM CHLORIDE 64 MG PO TBEC
2.0000 | DELAYED_RELEASE_TABLET | Freq: Every day | ORAL | Status: DC
Start: 2013-10-11 — End: 2013-10-12
  Administered 2013-10-11 – 2013-10-12 (×2): 128 mg via ORAL
  Filled 2013-10-11 (×3): qty 2

## 2013-10-11 MED ORDER — IBUPROFEN 600 MG PO TABS
600.0000 mg | ORAL_TABLET | Freq: Four times a day (QID) | ORAL | Status: DC | PRN
  Filled 2013-10-11: qty 1

## 2013-10-11 NOTE — Progress Notes (Signed)
VASCULAR LAB PRELIMINARY  PRELIMINARY  PRELIMINARY  PRELIMINARY  Bilateral lower extremity venous duplex  completed.    Preliminary report:  Bilateral:  No evidence of DVT, superficial thrombosis, or Baker's Cyst.    Christy Friede, RVT 10/11/2013, 10:03 AM

## 2013-10-11 NOTE — Progress Notes (Signed)
Pt ambulated to nursing station independently and back to pt room without assistance. RN entered pt room and pt reports feeling "slightly short of breath", states that "another nurse place me on oxygen." pt reports wanting to sit in the chair, 97% on 2L, 91-95% RA. Will ambulate pt in hallway this shift w/ oxygen sats and will continue to monitor.

## 2013-10-11 NOTE — Discharge Summary (Addendum)
Physician Discharge Summary  Kaytlan Terzi [id number] DOB: [birth year] DOA: 10/09/2013  PCP: Zollie Pee, MD  Admit date: 10/09/2013 Discharge date: 10/12/2013  Time spent: 45 minutes  Recommendations for Outpatient Follow-up:  1. Pulmonology follow up in 1 - 2 weeks. 2. BMET at PCP in 1-2 weeks. 3. Started on home O2- 2 L at the time 4. Counseled regarding importance of weight loss 5. May need referral to sleep lab  Discharge Diagnoses:  Principal Problem:   Acute respiratory failure with hypoxia Active Problems:   Acute renal failure   HTN (hypertension)   Hypokalemia   Discharge Condition: stable.  Much improved.  Diet recommendation: Heart Healthy diet.  Filed Weights   10/10/13 0515 10/11/13 0500 10/12/13 0447  Weight: 132 kg (291 lb 0.1 oz) 132 kg (291 lb 0.1 oz) 131.9 kg (290 lb 12.6 oz)    History of present illness at the time of admission:  63 year old female with past medical history of hypertension and anxiety who presented [facility]09[facility]2013 with worsening shortness of breath and associated productive cough ongoing for past few weeks prior to this admission. Pt reported having copious amount of greenish sputum and additional subjective fevers at home. No associated chest pain, palpitations.   In ED, BP was 127/79, HR 79, Tmax 99.5 F and oxygen saturation of 83% on room air. Oxygen saturation improved to 99% with 2 L [place] oxygen support.    Hospital Course:   Acute respiratory failure with hypoxia  -Secondary to COPD exacerbation with bronchitis, and obesity. - Patient was admitted, and started on empiric Azithromycin and Rocephin, was also placed on Solu-Medrol. She improved rapidly, Solu-Medrol has been changed to prednisone, this will be tapered on discharge. She will be transitioned from Rocephin and Zithromax to Levaquin. Home oxygen saturation screen was done, he can qualify for home O2, this will be arranged by case management prior to  discharge.  - Please note, by the day of discharge, patient ambulating, and has clear lungs on exam. -Influenza PCR is negative.  -Will discharge on quick prednisone taper, Levaquin, Dulera and home O2 -Strongly recommend pulmonary follow up. Patient has been advised to call her primary pulmonologist and make a quick followup appointment in the next 1-2 weeks. She has claimed understanding  Dyspnea on Exertion  - This is a chronic issue, this has apparently been going on for the past number of months. She has had followup with her primary pulmonologist for this. -Suspect this to be multifactorial from underlying COPD, obesity hypoventilation syndrome  -BNP is WNL  -2D echo shows normal wall motion and grade 1 diastolic dysfunction. Lower extremity Dopplers were negative. -Her weight is likely playing a significant roles in her symptoms- has been encouraged and counseled regarding the importance of weight loss. - Qualify for home oxygen-this will be arranged on discharge  Acute kidney injury -Likely due to [place] which has been discontinued. However we'll resume low dose HCTZ on discharge. -Resolved  - His monitor electrolytes periodically in the outpatient setting  HTN (hypertension)  -Stable with good control -Lisinopril discontinued due to chronic cough. -Continue Metoprolol 100mg  Q day and low dose HCTZ on discharge  Hypokalemia  -Resolved with oral supplementation.  -Magnesium repleted as well.  Hyperglycemia  -likely due to steroids  -Hgb A1C is 6.1 (pre-diabetic)   Morbid obesity  -Counseled regarding the importance of weight loss  -Nutrition evaluation completed.  Recommendations made.  Procedures:  2 D Echo Study Conclusions - Left  ventricle: The cavity size was normal. Wall thickness was increased in a pattern of mild LVH. Systolic function was normal. The estimated ejection fraction was in the range of 55% to 60%. Wall motion was normal; there were  no regional wall motion abnormalities. Doppler parameters are consistent with abnormal left ventricular relaxation (grade 1 diastolic dysfunction).  - Right atrium: The atrium was mildly dilated.  Venous Doppler Preliminary report: Bilateral: No evidence of DVT, superficial thrombosis, or Baker's Cyst.  Consultations:  None  Discharge Exam: Filed Vitals:   10/12/13 1031  BP: 117/70  Pulse: 78  Temp:   Resp:    General: NAD, appears stated age, very pleasant. Daughter at bedside. HEENT: Pharyngeal erythema but no exudates. Neck is thick.  Cardiovascular: RRR, S1 S2 auscultated, no rubs, murmurs or gallops.  Respiratory: Clear to auscultation bilaterally with equal chest rise; no wheezing. Still with nonproductive cough.  Abdomen: Obese, Soft, nontender, nondistended, + bowel sounds  Extremities: warm dry without cyanosis or edema.  Neuro: AAOx3, cranial nerves grossly intact. Strength 5/5 in upper and lower extremities  Skin: Without rashes exudates or nodules.  Psych: Normal affect and demeanor with intact judgement and insight   Discharge Instructions      Discharge Orders   Future Orders Complete By Expires   Call MD for:  difficulty breathing, headache or visual disturbances  As directed    Diet - low sodium heart healthy  As directed    Increase activity slowly  As directed        Medication List    STOP taking these medications       guaifenesin 100 MG/5ML syrup  Commonly known as:  ROBITUSSIN  Replaced by:  guaiFENesin 600 MG 12 hr tablet     lisinopril-hydrochlorothiazide 10-12.5 MG per tablet  Commonly known as:  PRINZIDE,ZESTORETIC      TAKE these medications       ALPRAZolam 1 MG tablet  Commonly known as:  XANAX  Take 2 mg by mouth at bedtime.     guaiFENesin 600 MG 12 hr tablet  Commonly known as:  MUCINEX  Take 1 tablet (600 mg total) by mouth 2 (two) times daily.     hydrochlorothiazide 12.5 MG capsule  Commonly known as:  MICROZIDE   Take 1 capsule (12.5 mg total) by mouth daily.     ipratropium-albuterol 0.5-2.5 (3) MG/3ML Soln  Commonly known as:  DUONEB  Take 3 mLs by nebulization 3 (three) times daily.     levofloxacin 750 MG tablet  Commonly known as:  LEVAQUIN  Take 1 tablet (750 mg total) by mouth daily.     metoprolol succinate 50 MG 24 hr tablet  Commonly known as:  TOPROL-XL  Take 100 mg by mouth every morning. Take with or immediately following a meal.     mometasone-formoterol 200-5 MCG/ACT Aero  Commonly known as:  DULERA  Inhale 2 puffs into the lungs 2 (two) times daily.     Potassium Chloride ER 20 MEQ Tbcr  Take 8 mEq by mouth daily.     predniSONE 10 MG tablet  Commonly known as:  DELTASONE  - Take 40 mg on 2/1.  - Take 30 mg on 2/2  - Take 20 mg on 2/3  - Take 10 mg on 2/4     zolpidem 10 MG tablet  Commonly known as:  AMBIEN  Take 10 mg by mouth at bedtime.       No Known Allergies Follow-up Information  Follow up with Zollie Pee, MD. Schedule an appointment as soon as possible for a visit in 2 weeks.   Specialty:  Internal Medicine   Contact information:   [place]  [phone]       Follow up with Henerson, Leafy Kindle., MD. Schedule an appointment as soon as possible for a visit in 2 weeks.   Specialty:  Internal Medicine   Contact information:   [address] [place] [address] [phone]        The results of significant diagnostics from this hospitalization (including imaging, microbiology, ancillary and laboratory) are listed below for reference.    Significant Diagnostic Studies: Dg Chest 2 View  10/09/2013   CLINICAL DATA:  SOB, cough, hypoxia  EXAM: CHEST  2 VIEW  COMPARISON:  DG CHEST 1V PORT dated 05/31/2006  FINDINGS: The heart size and mediastinal contours are within normal limits. Both lungs are clear. The visualized skeletal structures are unremarkable.  IMPRESSION: No active  cardiopulmonary disease.   Electronically Signed   By: Kathreen Devoid   On: 10/09/2013 11:31    Microbiology: Recent Results (from the past 240 hour(s))  URINE CULTURE     Status: None   Collection Time    10/09/13 10:30 PM      Result Value Range Status   Specimen Description URINE, RANDOM   Final   Special Requests NONE   Final   Culture  Setup Time     Final   Value: 10/10/2013 00:05     Performed at [facility]     Final   Value: NO GROWTH     Performed at [facility]   Culture     Final   Value: NO GROWTH     Performed at [facility]   Report Status 10/10/2013 FINAL   Final  CULTURE, EXPECTORATED SPUTUM-ASSESSMENT     Status: None   Collection Time    10/10/13  8:12 AM      Result Value Range Status   Specimen Description SPUTUM   Final   Special Requests NONE   Final   Sputum evaluation     Final   Value: MICROSCOPIC FINDINGS SUGGEST THAT THIS SPECIMEN IS NOT REPRESENTATIVE OF LOWER RESPIRATORY SECRETIONS. PLEASE RECOLLECT.     CALLED TO Q HOBLEY,RN 10/10/13 0915 BY K SCHULTZ   Report Status 10/10/2013 FINAL   Final     Labs: Basic Metabolic Panel:  Recent Labs Lab 10/09/13 1135 10/09/13 2058 10/10/13 0709 10/11/13 0548  NA 140 135* 136* 139  K 3.4* 3.3* 3.6* 3.7  CL 98 95* 98 100  CO2 29 25 24 26   GLUCOSE 110* 229* 164* 142*  BUN 10 12 19 19   CREATININE 1.20* 1.10 1.11* 1.03  CALCIUM 8.9 8.6 8.4 8.0*  MG  --  1.4*  --   --   PHOS  --  1.6*  --   --    Liver Function Tests:  Recent Labs Lab 10/09/13 2058 10/10/13 0709  AST 46* 36  ALT 24 21  ALKPHOS 88 86  BILITOT 0.7 0.6  PROT 9.4* 8.7*  ALBUMIN 3.2* 3.0*   CBC:  Recent Labs Lab 10/09/13 1135 10/09/13 2058 10/10/13 0709  WBC 8.0 7.7 12.6*  NEUTROABS 4.4 5.9  --   HGB 15.6* 15.9* 15.3*  HCT 45.8 45.3 44.3  MCV 100.2* 99.8 100.2*  PLT 166 182 174   Cardiac  Enzymes:  Recent Labs Lab 10/09/13 1135  TROPONINI <0.30   BNP: BNP (last 3  results)  Recent Labs  10/09/13 1135 10/11/13 0548  PROBNP 67.2 57.0   CBG:  Recent Labs Lab 10/11/13 0006 10/11/13 0745 10/12/13 0738  GLUCAP 194* 118* 92       Signed:  Lenise Herald [phone] [facility] 10/12/2013, 10:39 AM  Attending on 10/12/13 - Patient seen and examined, and agree with the above assessment and plan. Continues to rapidly improve, she is significantly better than on initial presentation, I saw walked in the hallway yesterday without oxygen. She has chronic dyspnea, acute presentation seems to be almost resolved. Per patient she is back to her usual baseline. Home O2 oxygen saturation screen was completed, she qualifies for home O2-ambulatory O2 saturation was down to 86%. I will ask case management to arrange for home O2, however suspect she is back at baseline and is stable for discharge. Patient will be discharged home today, I have asked her to followup with her primary care practitioner and primary pulmonologist in the next 1-2 weeks. I also had extensive discussions with her daughter yesterday.  Nena Alexander MD

## 2013-10-11 NOTE — Plan of Care (Signed)
Problem: Food- and Nutrition-Related Knowledge Deficit (NB-1.1) Goal: Nutrition education Formal process to instruct or train a patient/client in a skill or to impart knowledge to help patients/clients voluntarily manage or modify food choices and eating behavior to maintain or improve health. Outcome: Completed/Met Date Met:  10/11/13  RD consulted for nutrition education regarding weight loss. Pt is interested in joining [employer], has family members that has been successful with it. Encouraged her to pursue it. Pt reports that she only eats 1 meal a day. Pt with at least 25 packets of table sugar at bedside table. Encouraged artificial sweeteners for weight loss.  Body mass index is 51.56 kg/(m^2). Pt meets criteria for Obese Class III based on current BMI.  RD provided "Weight Loss Tips" handout from the Academy of Nutrition and Dietetics. Emphasized the importance of serving sizes and provided examples of correct portions of common foods. Discussed importance of controlled and consistent intake throughout the day. Provided examples of ways to balance meals/snacks and encouraged intake of high-fiber, whole grain complex carbohydrates. Emphasized the importance of hydration with calorie-free beverages and limiting sugar-sweetened beverages. Encouraged pt to discuss physical activity options with physician. Teach back method used.  Expect fair compliance.  Current diet order is Heart Healthy, patient is consuming approximately 50% of meals at this time. Labs and medications reviewed. No further nutrition interventions warranted at this time. RD contact information provided. If additional nutrition issues arise, please re-consult RD.  Inda Coke MS, RD, LDN Pager: [phone] After-hours pager: [phone]

## 2013-10-11 NOTE — Progress Notes (Signed)
[facility] PROGRESS NOTE  Sue [name] [id number] DOB: Feb 11, 1951 DOA: 10/09/2013 PCP: Zollie Pee, MD  HPI/Subjective: [name] is a 63 y/o female with a PMH of COPD, HTN, and anxiety who presented to the ED with SOB and productive cough (green sputum) x 3 weeks; worsening in past 3-4 days. She was admitted for hypoxia that is likely secondary to PNA/URI/Influenza as patient has had many sick contacts.   Currently patient still is requiring 2L of O2 for SOB with ambulation and coughing up sputum. She however feels much better than on admission  Assessment/Plan:  Principal Problem:  Acute respiratory failure with hypoxia  -Secondary to COPD exacerbation with bronchitis -Continue empiric Azithromycin and Rocephin since admission. -Progressed from solumedrol to  Prednisone taper (1/30)- significantly/rapidly improved compared to yesterday -Influenza PCR is negative.   -Patient required oxygen initially.  Now only needs oxygen with movement.  -Sx treatment with Albuterol Nebs, Dulera, Mucinex, and Robitussin   Active Problems:   Dyspnea on Exertion -over a period of months.  Reports she is unable to walk 20 steps without being SOB. - Suspect this to be multifactorial from underlying COPD, obesity hypoventilation syndrome -attempting to get records from PCP. -BNP is WNL -2D echo shows normal wall motion and grade 1 diastolic dysfunction.  Acute renal failure  -Likely due to Lisinopril/HCTZ which is being held  -Resolved -Cr down to 1.11 on 1/29 from 1.2 on admission  HTN (hypertension)  -Stable -Held Lisinopril/HCTZ  -Continue Metoprolol 100mg  Q day  Hypokalemia  -Resolved with oral supplementation. 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  DVT Prophylaxis:  Heparin  Code  Status: Full  Family Communication: No family at the beside this morning Disposition Plan: Remain inpatient.  Likely d/c home on 1/31 if improved.   Consultants:  None   Procedures:  2D Echocardiogram with contrast Study Conclusions  - Left ventricle: The cavity size was normal. Wall thickness was increased in a pattern of mild LVH. Systolic function was normal. The estimated ejection fraction was in the range of 55% to 60%. Wall motion was normal; there were no regional wall motion abnormalities. Doppler parameters are consistent with abnormal left ventricular relaxation (grade 1 diastolic dysfunction). - Right atrium: The atrium was mildly dilated.   Antibiotics:  Azithromax   Rocephin  Objective: Filed Vitals:   10/11/13 0415 10/11/13 0500 10/11/13 1025 10/11/13 1305  BP:   128/80 137/90  Pulse:   84 78  Temp: 97.6 F (36.4 C)   97.8 F (36.6 C)  TempSrc: Oral   Oral  Resp:    20  Height:      Weight:  132 kg (291 lb 0.1 oz)    SpO2:    93%    Intake/Output Summary (Last 24 hours) at 10/11/13 1349 Last data filed at 10/11/13 0200  Gross per 24 hour  Intake    640 ml  Output      0 ml  Net    640 ml   Filed Weights   10/09/13 1800 10/10/13 0515 10/11/13 0500  Weight: 130.4 kg (287 lb 7.7 oz) 132 kg (291 lb 0.1 oz) 132 kg (291 lb 0.1 oz)   Exam: General: NAD, appears stated age, very pleasant.  HEENT:  Pharyngeal erythema but no exudates.  Neck is thick. Cardiovascular: RRR, S1 S2 auscultated, no rubs,  murmurs or gallops.   Respiratory: Clear to auscultation bilaterally with equal chest rise; no wheezing.  Still with nonproductive cough. Abdomen: Obese, Soft, nontender, nondistended, + bowel sounds  Extremities: warm dry without cyanosis or edema.  Neuro: AAOx3, cranial nerves grossly intact. Strength 5/5 in upper and lower extremities  Skin: Without rashes exudates or nodules.   Psych: Normal affect and demeanor with intact judgement and insight  Data  Reviewed: Basic Metabolic Panel:  Recent Labs Lab 10/09/13 1135 10/09/13 2058 10/10/13 0709 10/11/13 0548  NA 140 135* 136* 139  K 3.4* 3.3* 3.6* 3.7  CL 98 95* 98 100  CO2 29 25 24 26   GLUCOSE 110* 229* 164* 142*  BUN 10 12 19 19   CREATININE 1.20* 1.10 1.11* 1.03  CALCIUM 8.9 8.6 8.4 8.0*  MG  --  1.4*  --   --   PHOS  --  1.6*  --   --    Liver Function Tests:  Recent Labs Lab 10/09/13 2058 10/10/13 0709  AST 46* 36  ALT 24 21  ALKPHOS 88 86  BILITOT 0.7 0.6  PROT 9.4* 8.7*  ALBUMIN 3.2* 3.0*   CBC:  Recent Labs Lab 10/09/13 1135 10/09/13 2058 10/10/13 0709  WBC 8.0 7.7 12.6*  NEUTROABS 4.4 5.9  --   HGB 15.6* 15.9* 15.3*  HCT 45.8 45.3 44.3  MCV 100.2* 99.8 100.2*  PLT 166 182 174   Cardiac Enzymes:  Recent Labs Lab 10/09/13 1135  TROPONINI <0.30   BNP (last 3 results)  Recent Labs  10/09/13 1135 10/11/13 0548  PROBNP 67.2 57.0     Recent Results (from the past 240 hour(s))  URINE CULTURE     Status: None   Collection Time    10/09/13 10:30 PM      Result Value Range Status   Specimen Description URINE, RANDOM   Final   Special Requests NONE   Final   Culture  Setup Time     Final   Value: 10/10/2013 00:05     Performed at [facility]     Final   Value: NO GROWTH     Performed at [facility]   Culture     Final   Value: NO GROWTH     Performed at [facility]   Report Status 10/10/2013 FINAL   Final  CULTURE, EXPECTORATED SPUTUM-ASSESSMENT     Status: None   Collection Time    10/10/13  8:12 AM      Result Value Range Status   Specimen Description SPUTUM   Final   Special Requests NONE   Final   Sputum evaluation     Final   Value: MICROSCOPIC FINDINGS SUGGEST THAT THIS SPECIMEN IS NOT REPRESENTATIVE OF LOWER RESPIRATORY SECRETIONS. PLEASE RECOLLECT.     CALLED TO Q HOBLEY,RN 10/10/13 0915 BY K SCHULTZ   Report Status 10/10/2013 FINAL   Final    Studies: No results  found.  Scheduled Meds: . ALPRAZolam  2 mg Oral QHS  . antiseptic oral rinse  15 mL Mouth Rinse BID  . azithromycin  500 mg Oral q1800  . cefTRIAXone (ROCEPHIN)  IV  1 g Intravenous Q24H  . guaiFENesin  600 mg Oral BID  . heparin subcutaneous  5,000 Units Subcutaneous Q8H  . ipratropium-albuterol  3 mL Nebulization TID  . magnesium chloride  2 tablet Oral Daily  . metoprolol succinate  100 mg Oral q morning - 10a  .  mometasone-formoterol  2 puff Inhalation BID  . predniSONE  50 mg Oral Q breakfast  . sodium chloride  3 mL Intravenous Q12H  . zolpidem  5 mg Oral QHS   Principal Problem:   Acute respiratory failure with hypoxia Active Problems:   Acute renal failure   HTN (hypertension)   Hypokalemia   Imogene Burn, PA-C [facility] Pager [phone]. If 7PM-7AM, please contact night-coverage at [email or web address], password [facility] 10/11/2013, 1:49 PM  LOS: 2 days   Attending Patient seen and examined, agree with the above assessment and plan. Much better, lungs are clear. Echocardiogram reveals normal systolic function, lower extremity Dopplers negative for DVT. Suspect most of exertional dyspnea that is chronic this is from obesity. Will continue to monitor for one Monday, if she continues to improve as rapidly as she has done over the past 24 hours, suspect she can go home tomorrow.  Nena Alexander MD

## 2013-10-11 NOTE — Progress Notes (Signed)
Utilization review completed.  

## 2013-10-11 NOTE — Care Management Note (Signed)
    Page 1 of 1   10/11/2013     5:15:48 PM   CARE MANAGEMENT NOTE 10/11/2013  Patient:  SHAMMARA, GRANDERSON   Account Number:  [id number]  Date Initiated:  10/11/2013  Documentation initiated by:  Tomi Bamberger  Subjective/Objective Assessment:   dx acute resp failure with hypoxia  admit- lives with daughter.     Action/Plan:   Anticipated DC Date:  10/13/2013   Anticipated DC Plan:  [place]         Choice offered to / List presented to:             Status of service:  In process, will continue to follow Medicare Important Message given?   (If response is "NO", the following Medicare IM given date fields will be blank) Date Medicare IM given:   Date Additional Medicare IM given:    Discharge Disposition:    Per UR Regulation:    If discussed at Long Length of Stay Meetings, dates discussed:    Comments:

## 2013-10-11 NOTE — Progress Notes (Signed)
  Echocardiogram 2D Echocardiogram has been performed.  [place], [place] 10/11/2013, 9:52 AM

## 2013-10-12 LAB — GLUCOSE, CAPILLARY: GLUCOSE-CAPILLARY: 92 mg/dL (ref 70–99)

## 2013-10-12 MED ORDER — IPRATROPIUM-ALBUTEROL 0.5-2.5 (3) MG/3ML IN SOLN: 3.0000 mL | Freq: Three times a day (TID) | RESPIRATORY_TRACT | Status: DC

## 2013-10-12 MED ORDER — HYDROCHLOROTHIAZIDE 12.5 MG PO CAPS: 12.5000 mg | ORAL_CAPSULE | Freq: Every day | ORAL | Status: DC

## 2013-10-12 MED ORDER — POTASSIUM CHLORIDE ER 20 MEQ PO TBCR: 8.0000 meq | EXTENDED_RELEASE_TABLET | Freq: Every day | ORAL | Status: DC

## 2013-10-12 NOTE — Progress Notes (Addendum)
Pt 93% Spo2 on room air while sitting. Ambulated and pt Sp02 dropped to 86% on room air. Sitting back in bed and Sp02 back above the 90s.   Pt ambulated on 2L, Sp02 89%. Md notified.

## 2013-10-12 NOTE — Progress Notes (Signed)
Elouise Kandis Fantasia to be D/C'd Home per MD order.  Discussed with the patient and all questions fully answered.    Medication List    STOP taking these medications       guaifenesin 100 MG/5ML syrup  Commonly known as:  ROBITUSSIN  Replaced by:  guaiFENesin 600 MG 12 hr tablet     lisinopril-hydrochlorothiazide 10-12.5 MG per tablet  Commonly known as:  PRINZIDE,ZESTORETIC      TAKE these medications       ALPRAZolam 1 MG tablet  Commonly known as:  XANAX  Take 2 mg by mouth at bedtime.     guaiFENesin 600 MG 12 hr tablet  Commonly known as:  MUCINEX  Take 1 tablet (600 mg total) by mouth 2 (two) times daily.     hydrochlorothiazide 12.5 MG capsule  Commonly known as:  MICROZIDE  Take 1 capsule (12.5 mg total) by mouth daily.     ipratropium-albuterol 0.5-2.5 (3) MG/3ML Soln  Commonly known as:  DUONEB  Take 3 mLs by nebulization 3 (three) times daily.     levofloxacin 750 MG tablet  Commonly known as:  LEVAQUIN  Take 1 tablet (750 mg total) by mouth daily.     metoprolol succinate 50 MG 24 hr tablet  Commonly known as:  TOPROL-XL  Take 100 mg by mouth every morning. Take with or immediately following a meal.     mometasone-formoterol 200-5 MCG/ACT Aero  Commonly known as:  DULERA  Inhale 2 puffs into the lungs 2 (two) times daily.     Potassium Chloride ER 20 MEQ Tbcr  Take 8 mEq by mouth daily.     predniSONE 10 MG tablet  Commonly known as:  DELTASONE  - Take 40 mg on 2/1.  - Take 30 mg on 2/2  - Take 20 mg on 2/3  - Take 10 mg on 2/4     zolpidem 10 MG tablet  Commonly known as:  AMBIEN  Take 10 mg by mouth at bedtime.        VVS, Skin clean, dry and intact without evidence of skin break down, no evidence of skin tears noted. IV catheter discontinued intact. Site without signs and symptoms of complications. Dressing and pressure applied.  An After Visit Summary was printed and given to the patient.  D/c education completed with  patient/family including follow up instructions, medication list, d/c activities limitations if indicated, with other d/c instructions as indicated by MD - patient able to verbalize understanding, all questions fully answered.   Patient instructed to return to ED, call 911, or call MD for any changes in condition.   Patient escorted via [place], and D/C home via private auto.  Delman Cheadle 10/12/2013 1:16 PM

## 2013-10-15 ENCOUNTER — Encounter: Payer: Self-pay | Admitting: Family Medicine

## 2013-10-15 ENCOUNTER — Ambulatory Visit (INDEPENDENT_AMBULATORY_CARE_PROVIDER_SITE_OTHER): Payer: Medicare Other | Admitting: Family Medicine

## 2013-10-15 VITALS — BP 122/70 | HR 62 | Temp 98.4°F | Ht 66.0 in | Wt 286.0 lb

## 2013-10-15 DIAGNOSIS — F418 Other specified anxiety disorders: Secondary | ICD-10-CM | POA: Insufficient documentation

## 2013-10-15 DIAGNOSIS — G4700 Insomnia, unspecified: Secondary | ICD-10-CM

## 2013-10-15 DIAGNOSIS — N179 Acute kidney failure, unspecified: Secondary | ICD-10-CM

## 2013-10-15 DIAGNOSIS — E669 Obesity, unspecified: Secondary | ICD-10-CM | POA: Insufficient documentation

## 2013-10-15 DIAGNOSIS — F341 Dysthymic disorder: Secondary | ICD-10-CM

## 2013-10-15 DIAGNOSIS — J9601 Acute respiratory failure with hypoxia: Secondary | ICD-10-CM

## 2013-10-15 DIAGNOSIS — I10 Essential (primary) hypertension: Secondary | ICD-10-CM

## 2013-10-15 DIAGNOSIS — E876 Hypokalemia: Secondary | ICD-10-CM

## 2013-10-15 DIAGNOSIS — F411 Generalized anxiety disorder: Secondary | ICD-10-CM

## 2013-10-15 DIAGNOSIS — J441 Chronic obstructive pulmonary disease with (acute) exacerbation: Secondary | ICD-10-CM

## 2013-10-15 DIAGNOSIS — J9600 Acute respiratory failure, unspecified whether with hypoxia or hypercapnia: Secondary | ICD-10-CM

## 2013-10-15 DIAGNOSIS — J9690 Respiratory failure, unspecified, unspecified whether with hypoxia or hypercapnia: Secondary | ICD-10-CM

## 2013-10-15 LAB — POCT URINALYSIS DIPSTICK
BILIRUBIN UA: NEGATIVE
GLUCOSE UA: NEGATIVE
Ketones, UA: NEGATIVE
Leukocytes, UA: NEGATIVE
NITRITE UA: NEGATIVE
Protein, UA: NEGATIVE
RBC UA: NEGATIVE
Spec Grav, UA: 1.01
Urobilinogen, UA: 0.2
pH, UA: 7.5

## 2013-10-15 MED ORDER — ALPRAZOLAM 1 MG PO TABS: 1.0000 mg | ORAL_TABLET | Freq: Two times a day (BID) | ORAL | Status: DC | PRN

## 2013-10-15 MED ORDER — ALPRAZOLAM 0.5 MG PO TABS: 0.5000 mg | ORAL_TABLET | Freq: Two times a day (BID) | ORAL | Status: DC | PRN

## 2013-10-15 NOTE — Progress Notes (Signed)
Pre visit review using our clinic review tool, if applicable. No additional management support is needed unless otherwise documented below in the visit note. 

## 2013-10-15 NOTE — Assessment & Plan Note (Signed)
Pt to see pulm Weaning off xanax

## 2013-10-15 NOTE — Assessment & Plan Note (Signed)
Check labs today.

## 2013-10-15 NOTE — Assessment & Plan Note (Signed)
Hx copd  Doing well with O2 Is supposed to be on duoneb inhalers but could not afford med--- her daughter states they are getting med today Pt also d/c on dulera with instructions to f/u pulm within 2 weeks

## 2013-10-15 NOTE — Assessment & Plan Note (Signed)
Decrease xanax to 0.5 mg bid prn  Pt does not feel she needs anything else----we will con't to watch pt and observe for signs of depression/ anxiety Daughter is aware as well and is in agreement

## 2013-10-15 NOTE — Assessment & Plan Note (Signed)
Discussed importance of weight loss. 

## 2013-10-15 NOTE — Progress Notes (Signed)
   Subjective:    Patient ID: Sue [name], female    DOB: 05/17/1951, 63 y.o.   MRN: [id number]  HPI Pt here with her daughter for hospital f/u for resp failure, renal insufficiency and hypokalemia.  Pt has a hx of copd but never needed tx per her.  Her daughter states they are switching to [facility] for care so they can watch her more carefully and make sure she is taken care of.  She was in clinic in [place].  Pt was progressively getting more SOB until the day they went to ER.   Daughter states she was take ambien 10 mg qhs with xanax 1 mg 2 tab qhs with a glass of wine every night.  Pt was also being treated for anxiety and depression but is only taking xanax.  No other complaints.  Pt now on O2.    Hosp d/c summary reviewed.     Review of Systems As above    Objective:   Physical Exam  BP 122/70  Pulse 62  Temp(Src) 98.4 F (36.9 C) (Oral)  Ht 5\' 6"  (1.676 m)  Wt 286 lb (129.729 kg)  BMI 46.18 kg/m2  SpO2 97% General appearance: alert, cooperative, appears stated age and mild distress,  Morbidly obese Neck: no adenopathy, no carotid bruit, no JVD, supple, symmetrical, trachea midline and thyroid not enlarged, symmetric, no tenderness/mass/nodules Lungs: clear to auscultation bilaterally Heart: S1, S2 normal Extremities: extremities normal, atraumatic, no cyanosis or edema       Assessment & Plan:

## 2013-10-15 NOTE — Assessment & Plan Note (Signed)
Stable con't meds 

## 2013-10-15 NOTE — Patient Instructions (Signed)
Chronic Obstructive Pulmonary Disease  Chronic obstructive pulmonary disease (COPD) is a common lung condition in which airflow from the lungs is limited. COPD is a general term that can be used to describe many different lung problems that limit airflow, including both chronic bronchitis and emphysema.  If you have COPD, your lung function will probably never return to normal, but there are measures you can take to improve lung function and make yourself feel better.   CAUSES   · Smoking (common).    · Exposure to secondhand smoke.    · Genetic problems.  · Chronic inflammatory lung diseases or recurrent infections.  SYMPTOMS   · Shortness of breath, especially with physical activity.    · Deep, persistent (chronic) cough with a large amount of thick mucus.    · Wheezing.    · Rapid breaths (tachypnea).    · Gray or bluish discoloration (cyanosis) of the skin, especially in fingers, toes, or lips.    · Fatigue.    · Weight loss.    · Frequent infections or episodes when breathing symptoms become much worse (exacerbations).    · Chest tightness.  DIAGNOSIS   Your healthcare provider will take a medical history and perform a physical examination to make the initial diagnosis.  Additional tests for COPD may include:   · Lung (pulmonary) function tests.  · Chest X-ray.  · CT scan.  · Blood tests.  TREATMENT   Treatment available to help you feel better when you have COPD include:   · Inhaler and nebulizer medicines. These help manage the symptoms of COPD and make your breathing more comfortable  · Supplemental oxygen. Supplemental oxygen is only helpful if you have a low oxygen level in your blood.    · Exercise and physical activity. These are beneficial for nearly all people with COPD. Some people may also benefit from a pulmonary rehabilitation program.  HOME CARE INSTRUCTIONS   · Take all medicines (inhaled or pills) as directed by your health care provider.  · Only take over-the-counter or prescription medicines  for pain, fever, or discomfort as directed by your health care provider.    · Avoid over-the-counter medicines or cough syrups that dry up your airway (such as antihistamines) and slow down the elimination of secretions unless instructed otherwise by your healthcare provider.    · If you are a smoker, the most important thing that you can do is stop smoking. Continuing to smoke will cause further lung damage and breathing trouble. Ask your health care provider for help with quitting smoking. He or she can direct you to community resources or hospitals that provide support.  · Avoid exposure to irritants such as smoke, chemicals, and fumes that aggravate your breathing.  · Use oxygen therapy and pulmonary rehabilitation if directed by your health care provider. If you require home oxygen therapy, ask your healthcare provider whether you should purchase a pulse oximeter to measure your oxygen level at home.    · Avoid contact with individuals who have a contagious illness.  · Avoid extreme temperature and humidity changes.  · Eat healthy foods. Eating smaller, more frequent meals and resting before meals may help you maintain your strength.  · Stay active, but balance activity with periods of rest. Exercise and physical activity will help you maintain your ability to do things you want to do.  · Preventing infection and hospitalization is very important when you have COPD. Make sure to receive all the vaccines your health care provider recommends, especially the pneumococcal and influenza vaccines. Ask your healthcare provider whether you   need a pneumonia vaccine.  · Learn and use relaxation techniques to manage stress.  · Learn and use controlled breathing techniques as directed by your health care provider. Controlled breathing techniques include:    · Pursed lip breathing. Start by breathing in (inhaling) through your nose for 1 second. Then, purse your lips as if you were going to whistle and breathe out (exhale)  through the pursed lips for 2 seconds.    · Diaphragmatic breathing. Start by putting one hand on your abdomen just above your waist. Inhale slowly through your nose. The hand on your abdomen should move out. Then purse your lips and exhale slowly. You should be able to feel the hand on your abdomen moving in as you exhale.    · Learn and use controlled coughing to clear mucus from your lungs. Controlled coughing is a series of short, progressive coughs. The steps of controlled coughing are:    1. Lean your head slightly forward.    2. Breathe in deeply using diaphragmatic breathing.    3. Try to hold your breath for 3 seconds.    4. Keep your mouth slightly open while coughing twice.    5. Spit any mucus out into a tissue.    6. Rest and repeat the steps once or twice as needed.  SEEK MEDICAL CARE IF:   · You are coughing up more mucus than usual.    · There is a change in the color or thickness of your mucus.    · Your breathing is more labored than usual.    · Your breathing is faster than usual.    SEEK IMMEDIATE MEDICAL CARE IF:   · You have shortness of breath while you are resting.    · You have shortness of breath that prevents you from:  · Being able to talk.    · Performing your usual physical activities.    · You have chest pain lasting longer than 5 minutes.    · Your skin color is more cyanotic than usual.  · You measure low oxygen saturations for longer than 5 minutes with a pulse oximeter.  MAKE SURE YOU:   · Understand these instructions.  · Will watch your condition.  · Will get help right away if you are not doing well or get worse.  Document Released: 06/08/2005 Document Revised: 06/19/2013 Document Reviewed: 04/25/2013  [employer]® Patient Information ©2014 [employer].

## 2013-10-15 NOTE — Assessment & Plan Note (Signed)
Recheck bmp today. 

## 2013-10-16 ENCOUNTER — Telehealth: Payer: Self-pay | Admitting: Family Medicine

## 2013-10-16 ENCOUNTER — Telehealth: Payer: Self-pay | Admitting: *Deleted

## 2013-10-16 LAB — BASIC METABOLIC PANEL
BUN: 16 mg/dL (ref 6–23)
CHLORIDE: 106 meq/L (ref 96–112)
CO2: 26 mEq/L (ref 19–32)
Calcium: 8.1 mg/dL — ABNORMAL LOW (ref 8.4–10.5)
Creatinine, Ser: 1 mg/dL (ref 0.4–1.2)
GFR: 69.23 mL/min (ref >60.00)
Glucose, Bld: 89 mg/dL (ref 70–99)
POTASSIUM: 3.7 meq/L (ref 3.5–5.1)
Sodium: 138 mEq/L (ref 135–145)

## 2013-10-16 LAB — CBC WITH DIFFERENTIAL/PLATELET
BASOS PCT: 0.4 % (ref 0.0–3.0)
Basophils Absolute: 0 10*3/uL (ref 0.0–0.1)
EOS PCT: 0 % (ref 0.0–5.0)
Eosinophils Absolute: 0 10*3/uL (ref 0.0–0.7)
HEMATOCRIT: 45.3 % (ref 36.0–46.0)
Hemoglobin: 14.9 g/dL (ref 12.0–15.0)
LYMPHS ABS: 1.8 10*3/uL (ref 0.7–4.0)
Lymphocytes Relative: 16.6 % (ref 12.0–46.0)
MCHC: 32.8 g/dL (ref 30.0–36.0)
MCV: 102.7 fl — AB (ref 78.0–100.0)
MONO ABS: 0 10*3/uL — AB (ref 0.1–1.0)
MONOS PCT: 0.4 % — AB (ref 3.0–12.0)
Neutro Abs: 9 10*3/uL — ABNORMAL HIGH (ref 1.4–7.7)
Neutrophils Relative %: 82.6 % — ABNORMAL HIGH (ref 43.0–77.0)
Platelets: 154 10*3/uL (ref 150.0–400.0)
RBC: 4.41 Mil/uL (ref 3.87–5.11)
RDW: 14 % (ref 11.5–14.6)
WBC: 10.9 10*3/uL — AB (ref 4.5–10.5)

## 2013-10-16 LAB — LIPID PANEL
Cholesterol: 134 mg/dL (ref 0–200)
HDL: 38.8 mg/dL — ABNORMAL LOW (ref >39.00)
LDL Cholesterol: 82 mg/dL (ref 0–99)
TRIGLYCERIDES: 65 mg/dL (ref 0.0–149.0)
Total CHOL/HDL Ratio: 3
VLDL: 13 mg/dL (ref 0.0–40.0)

## 2013-10-16 LAB — HEPATIC FUNCTION PANEL
ALT: 27 U/L (ref 0–35)
AST: 35 U/L (ref 0–37)
Albumin: 3 g/dL — ABNORMAL LOW (ref 3.5–5.2)
Alkaline Phosphatase: 66 U/L (ref 39–117)
Bilirubin, Direct: 0.1 mg/dL (ref 0.0–0.3)
TOTAL PROTEIN: 7.3 g/dL (ref 6.0–8.3)
Total Bilirubin: 0.6 mg/dL (ref 0.3–1.2)

## 2013-10-16 NOTE — Telephone Encounter (Signed)
Please advise      KP 

## 2013-10-16 NOTE — Telephone Encounter (Signed)
When is appointment with Dr Melvyn Novas-- I prefer he take care of oxygen if it is soon

## 2013-10-16 NOTE — Telephone Encounter (Signed)
Relevant patient education mailed to patient.  

## 2013-10-16 NOTE — Telephone Encounter (Signed)
Patient called because she would like to change were she receives her oxygen from [facility] to [facility]  phone [phone]. [facility] needs a order stating that how much oxygen she needs. Also patient stated that she is almost out of oxygen.

## 2013-10-17 NOTE — Telephone Encounter (Signed)
Patient was called today to schedule appt. There was no answer and no voicemail available.

## 2013-10-17 NOTE — Telephone Encounter (Signed)
When is the specialist apt scheduled?     KP

## 2013-10-21 ENCOUNTER — Telehealth: Payer: Self-pay | Admitting: Family Medicine

## 2013-10-21 MED ORDER — FLUCONAZOLE 150 MG PO TABS: 150.0000 mg | ORAL_TABLET | Freq: Once | ORAL | Status: DC

## 2013-10-21 NOTE — Telephone Encounter (Signed)
Msg left advising to follow up with specialist in reference to the oxygen and if any question or concerns call the office       KP

## 2013-10-21 NOTE — Telephone Encounter (Signed)
Patient state she was on antibiotics in the hospital and now has a yeast infection in her mouth. She is requesting rx for diflucan. Pt uses [facility] on [address].

## 2013-10-21 NOTE — Telephone Encounter (Signed)
Detailed MSG advising Rx sent      KP

## 2013-10-21 NOTE — Telephone Encounter (Signed)
Please advise 

## 2013-10-21 NOTE — Telephone Encounter (Signed)
Diflucan 150 mg #2 1 po qd x 1 may repeat in 3 days prn

## 2013-10-23 ENCOUNTER — Encounter: Payer: Self-pay | Admitting: Internal Medicine

## 2013-10-23 ENCOUNTER — Ambulatory Visit (INDEPENDENT_AMBULATORY_CARE_PROVIDER_SITE_OTHER): Payer: Medicare Other | Admitting: Internal Medicine

## 2013-10-23 VITALS — BP 140/84 | HR 55 | Temp 97.5°F | Ht 63.0 in | Wt 294.0 lb

## 2013-10-23 DIAGNOSIS — J9610 Chronic respiratory failure, unspecified whether with hypoxia or hypercapnia: Secondary | ICD-10-CM

## 2013-10-23 DIAGNOSIS — J449 Chronic obstructive pulmonary disease, unspecified: Secondary | ICD-10-CM

## 2013-10-23 DIAGNOSIS — E669 Obesity, unspecified: Secondary | ICD-10-CM

## 2013-10-23 NOTE — Progress Notes (Signed)
Subjective:    Patient ID: Sue [name], female    DOB: 02-Dec-1950  MRN: [id number]  HPI  62 yobf quit smoking 2013 when  dx with copd/emphysema by [facility] doctor with doe on exertion at that point at wt 180 and steady wt gain of about 100lb assoc with increased doe x room to room and difficulty with housework then admitted with acute resp failure/ acute renal failure on acei  Admit date: 10/09/2013  Discharge date: 10/12/2013   Recommendations for Outpatient Follow-up:  1. Pulmonology follow up in 1 - 2 weeks. 2. BMET at PCP in 1-2 weeks. 3. Started on home O2- 2 L at the time 4. Counseled regarding importance of weight loss 5. May need referral to sleep lab  Discharge Diagnoses:  Principal Problem:  Acute respiratory failure with hypoxia  Active Problems:  Acute renal failure  HTN (hypertension)  Hypokalemia     History of present illness at the time of admission:  63 year old female with past medical history of hypertension and anxiety who presented to [facility] on 10/09/2013 with worsening shortness of breath and associated productive cough ongoing for past few weeks prior to this admission. Pt reported having copious amount of greenish sputum and additional subjective fevers at home. No associated chest pain, palpitations.  In ED, BP was 127/79, HR 79, Tmax 99.5 F and oxygen saturation of 83% on room air. Oxygen saturation improved to 99% with 2 L [place] oxygen support.   Hospital Course:  Acute respiratory failure with hypoxia  -Secondary to COPD exacerbation with bronchitis, and obesity.  - Patient was admitted, and started on empiric Azithromycin and Rocephin, was also placed on Solu-Medrol. She improved rapidly, Solu-Medrol has been changed to prednisone, this will be tapered on discharge. She will be transitioned from Rocephin and Zithromax to Levaquin. Home oxygen saturation screen was done, he can qualify for home O2, this will be arranged by case management prior to discharge.   - Please note, by the day of discharge, patient ambulating, and has clear lungs on exam.  -Influenza PCR is negative.  -Will discharge on quick prednisone taper, Levaquin, Dulera and home O2  -Strongly recommend pulmonary follow up. Patient has been advised to call her primary pulmonologist and make a quick followup appointment in the next 1-2 weeks. She has claimed understanding  Dyspnea on Exertion  - This is a chronic issue, this has apparently been going on for the past number of months. She has had followup with her primary pulmonologist for this.  -Suspect this to be multifactorial from underlying COPD, obesity hypoventilation syndrome  -BNP is WNL  -2D echo shows normal wall motion and grade 1 diastolic dysfunction. Lower extremity Dopplers were negative.  -Her weight is likely playing a significant roles in her symptoms- has been encouraged and counseled regarding the importance of weight loss.  - Qualify for home oxygen-this will be arranged on discharge  Acute kidney injury  -Likely due to [place] which has been discontinued. However we'll resume low dose HCTZ on discharge.  -Resolved  - His monitor electrolytes periodically in the outpatient setting  HTN (hypertension)  -Stable with good control  -Lisinopril discontinued due to chronic cough.  -Continue Metoprolol 100mg  Q day and low dose HCTZ on discharge  Hypokalemia  -Resolved with oral supplementation.  -Magnesium repleted as well.  Hyperglycemia  -likely due to steroids  -Hgb A1C is 6.1 (pre-diabetic)  Morbid obesity  -Counseled regarding the importance of weight loss  -Nutrition evaluation  completed. Recommendations made.  Procedures:  2 D Echo Study Conclusions - Left ventricle: The cavity size was normal. Wall thickness was increased in a pattern of mild LVH. Systolic function was normal. The estimated ejection fraction was in the range of 55% to 60%. Wall motion was normal; there were no regional wall  motion abnormalities. Doppler parameters are consistent with abnormal left ventricular relaxation (grade 1 diastolic dysfunction). - Right atrium: The atrium was mildly dilated.  Venous Doppler  Preliminary report: Bilateral: No evidence of DVT, superficial thrombosis, or Baker's Cyst.      10/23/2013 1st [facility] office visit/ Sue [name] cc chronic orthopnea x sev years and doe and now on 02 since d/c above still gets let off at curb on dulera bid and duoneb qid s much variability, assoc with min daytime dry cough better off acei, still some HB symptoms   No obvious day to day or daytime variabilty  or cp or chest tightness, subjective wheeze overt sinus   symptoms. No unusual exp hx or h/o childhood pna/ asthma or knowledge of premature birth.  Sleeping ok without nocturnal  or early am exacerbation  of respiratory  c/o's or need for noct saba. Also denies any obvious fluctuation of symptoms with weather or environmental changes or other aggravating or alleviating factors except as outlined above   Current Medications, Allergies, Complete Past Medical History, Past Surgical History, Family History, and Social History were reviewed in [employer] record.            Review of Systems  Constitutional: Negative for fever, chills and unexpected weight change.  HENT: Positive for sore throat. Negative for congestion, dental problem, ear pain, nosebleeds, postnasal drip, rhinorrhea, sinus pressure, sneezing, trouble swallowing and voice change.   Eyes: Negative for visual disturbance.  Respiratory: Positive for cough and shortness of breath. Negative for choking.   Cardiovascular: Negative for chest pain and leg swelling.  Gastrointestinal: Negative for vomiting, abdominal pain and diarrhea.  Genitourinary: Negative for difficulty urinating.       Acid heartburn Indigestion  Musculoskeletal: Negative for arthralgias.  Skin: Negative for rash.  Neurological:  Positive for headaches. Negative for tremors and syncope.  Hematological: Does not bruise/bleed easily.       Objective:   Physical Exam  Wt Readings from Last 3 Encounters:  10/23/13 294 lb (133.358 kg)  10/15/13 286 lb (129.729 kg)  10/12/13 290 lb 12.6 oz (131.9 kg)      HEENT: nl dentition, turbinates, and orophanx. Nl external ear canals without cough reflex   NECK :  without JVD/Nodes/TM/ nl carotid upstrokes bilaterally   LUNGS: no acc muscle use, clear to A and P bilaterally without cough on insp or exp maneuvers   CV:  RRR  no s3 or murmur or increase in P2, no edema   ABD:  soft and nontender with nl excursion in the supine position. No bruits or organomegaly, bowel sounds nl  MS:  warm without deformities, calf tenderness, cyanosis or clubbing  SKIN: warm and dry without lesions    NEURO:  alert, approp, no deficits     cxr 10/09/13 FINDINGS:  The heart size and mediastinal contours are within normal limits.  Both lungs are clear. The visualized skeletal structures are  unremarkable.  IMPRESSION:  No active cardiopulmonary disease.        Assessment & Plan:

## 2013-10-23 NOTE — Patient Instructions (Signed)
Work on inhaler technique:  relax and gently blow all the way out then take a nice smooth deep breath back in, triggering the inhaler at same time you start breathing in.  Hold for up to 5 seconds if you can.  Rinse and gargle with water when done  dulera 200 Take 2 puffs first thing in am and then another 2 puffs about 12 hours later.   Only use the duoneb if you need it to catch your breath up to 4 x daily   In the meantime wear the 02 24/7 and especially with exercise a minimum of 30 min per day  Please schedule a follow up office visit in 4 weeks, sooner if needed for pfts on return

## 2013-10-25 ENCOUNTER — Encounter: Payer: Self-pay | Admitting: Internal Medicine

## 2013-10-25 DIAGNOSIS — R0609 Other forms of dyspnea: Secondary | ICD-10-CM

## 2013-10-25 DIAGNOSIS — J9610 Chronic respiratory failure, unspecified whether with hypoxia or hypercapnia: Secondary | ICD-10-CM | POA: Insufficient documentation

## 2013-10-25 NOTE — Assessment & Plan Note (Addendum)
  When respiratory symptoms begin or become refractory well after a patient reports complete smoking cessation,  Especially when this wasn't the case while they were smoking, a red flag is raised based on the work of Dr Kris Mouton which states:  if you quit smoking when your best day FEV1 is still well preserved it is highly unlikely you will progress to severe disease.  That is to say, once the smoking stops,  the symptoms should not suddenly erupt or markedly worsen.  If so, the differential diagnosis should include  obesity/deconditioning,  LPR/Reflux/Aspiration syndromes,  occult CHF, or  especially side effect of medications commonly used in this population, especially ACEi > rec leave off indefinitely.    The proper method of use, as well as anticipated side effects, of a metered-dose inhaler are discussed and demonstrated to the patient. Improved effectiveness after extensive coaching during this visit to a level of approximately  75% so ok to continue dulera 200 2bid pending pfts and just use the neb prn   See instructions for specific recommendations which were reviewed directly with the patient who was given a copy with highlighter outlining the key components.

## 2013-10-25 NOTE — Assessment & Plan Note (Signed)
Lab Results  Component Value Date   TSH 1.572 10/09/2013    Discussed with pt  Weight control is simply a matter of calorie balance which needs to be tilted in your favor by eating less and exercising more.  To get the most out of exercise, you need to be continuously aware that you are short of breath ON 02, but never out of breath, for 30 minutes daily. As you improve, it will actually be easier for you to do the same amount of exercise   in  30 minutes so always push to the level where you are short of breath.  If this does not result in gradual weight reduction then I strongly recommend you see a nutritionist with a food diary x 2 weeks so that we can work out a negative calorie balance which is universally effective in steady weight loss programs.

## 2013-10-25 NOTE — Assessment & Plan Note (Signed)
-   placed on 02 at d/c 10/12/13   stronlgy suspect this is more related to obesity than previous smoking hx though she may have sign copd as well  For now plan to continue 2lpm 24/7

## 2013-11-06 ENCOUNTER — Emergency Department (HOSPITAL_COMMUNITY): Payer: Medicare Other

## 2013-11-06 ENCOUNTER — Encounter (HOSPITAL_COMMUNITY): Payer: Self-pay | Admitting: Emergency Medicine

## 2013-11-06 ENCOUNTER — Emergency Department (HOSPITAL_COMMUNITY)
Admission: EM | Admit: 2013-11-06 | Discharge: 2013-11-06 | Disposition: A | Discharge status: Home / Self Care | Payer: Medicare Other | Attending: Emergency Medicine | Admitting: Emergency Medicine

## 2013-11-06 DIAGNOSIS — I10 Essential (primary) hypertension: Secondary | ICD-10-CM | POA: Insufficient documentation

## 2013-11-06 DIAGNOSIS — Z87891 Personal history of nicotine dependence: Secondary | ICD-10-CM | POA: Insufficient documentation

## 2013-11-06 DIAGNOSIS — R0789 Other chest pain: Secondary | ICD-10-CM | POA: Insufficient documentation

## 2013-11-06 DIAGNOSIS — R079 Chest pain, unspecified: Secondary | ICD-10-CM

## 2013-11-06 DIAGNOSIS — F329 Major depressive disorder, single episode, unspecified: Secondary | ICD-10-CM | POA: Insufficient documentation

## 2013-11-06 DIAGNOSIS — Z8709 Personal history of other diseases of the respiratory system: Secondary | ICD-10-CM

## 2013-11-06 DIAGNOSIS — J438 Other emphysema: Secondary | ICD-10-CM | POA: Insufficient documentation

## 2013-11-06 DIAGNOSIS — E669 Obesity, unspecified: Secondary | ICD-10-CM | POA: Insufficient documentation

## 2013-11-06 DIAGNOSIS — R5381 Other malaise: Secondary | ICD-10-CM | POA: Insufficient documentation

## 2013-11-06 DIAGNOSIS — F3289 Other specified depressive episodes: Secondary | ICD-10-CM | POA: Insufficient documentation

## 2013-11-06 DIAGNOSIS — R5383 Other fatigue: Secondary | ICD-10-CM

## 2013-11-06 DIAGNOSIS — R531 Weakness: Secondary | ICD-10-CM

## 2013-11-06 DIAGNOSIS — Z9981 Dependence on supplemental oxygen: Secondary | ICD-10-CM | POA: Insufficient documentation

## 2013-11-06 DIAGNOSIS — Z79899 Other long term (current) drug therapy: Secondary | ICD-10-CM | POA: Insufficient documentation

## 2013-11-06 DIAGNOSIS — Z8719 Personal history of other diseases of the digestive system: Secondary | ICD-10-CM | POA: Insufficient documentation

## 2013-11-06 DIAGNOSIS — IMO0002 Reserved for concepts with insufficient information to code with codable children: Secondary | ICD-10-CM | POA: Insufficient documentation

## 2013-11-06 DIAGNOSIS — R609 Edema, unspecified: Secondary | ICD-10-CM | POA: Insufficient documentation

## 2013-11-06 LAB — CBC WITH DIFFERENTIAL/PLATELET
Basophils Absolute: 0 10*3/uL (ref 0.0–0.1)
Basophils Relative: 0 % (ref 0–1)
EOS ABS: 0.1 10*3/uL (ref 0.0–0.7)
Eosinophils Relative: 2 % (ref 0–5)
HCT: 42.2 % (ref 36.0–46.0)
Hemoglobin: 14.9 g/dL (ref 12.0–15.0)
LYMPHS ABS: 2.6 10*3/uL (ref 0.7–4.0)
Lymphocytes Relative: 44 % (ref 12–46)
MCH: 34.7 pg — AB (ref 26.0–34.0)
MCHC: 35.3 g/dL (ref 30.0–36.0)
MCV: 98.4 fL (ref 78.0–100.0)
Monocytes Absolute: 0.5 10*3/uL (ref 0.1–1.0)
Monocytes Relative: 8 % (ref 3–12)
NEUTROS PCT: 47 % (ref 43–77)
Neutro Abs: 2.8 10*3/uL (ref 1.7–7.7)
Platelets: 225 10*3/uL (ref 150–400)
RBC: 4.29 MIL/uL (ref 3.87–5.11)
RDW: 13.2 % (ref 11.5–15.5)
WBC: 6 10*3/uL (ref 4.0–10.5)

## 2013-11-06 LAB — COMPREHENSIVE METABOLIC PANEL
ALK PHOS: 101 U/L (ref 39–117)
ALT: 33 U/L (ref 0–35)
AST: 67 U/L — ABNORMAL HIGH (ref 0–37)
Albumin: 3.1 g/dL — ABNORMAL LOW (ref 3.5–5.2)
BUN: 9 mg/dL (ref 6–23)
CHLORIDE: 100 meq/L (ref 96–112)
CO2: 27 mEq/L (ref 19–32)
Calcium: 9.5 mg/dL (ref 8.4–10.5)
Creatinine, Ser: 0.94 mg/dL (ref 0.50–1.10)
GFR calc Af Amer: 74 mL/min — ABNORMAL LOW (ref >90)
GFR calc non Af Amer: 64 mL/min — ABNORMAL LOW (ref >90)
GLUCOSE: 116 mg/dL — AB (ref 70–99)
POTASSIUM: 3.9 meq/L (ref 3.7–5.3)
SODIUM: 139 meq/L (ref 137–147)
TOTAL PROTEIN: 8.2 g/dL (ref 6.0–8.3)
Total Bilirubin: 0.6 mg/dL (ref 0.3–1.2)

## 2013-11-06 LAB — LIPASE, BLOOD: Lipase: 40 U/L (ref 11–59)

## 2013-11-06 LAB — I-STAT TROPONIN, ED: TROPONIN I, POC: 0 ng/mL (ref 0.00–0.08)

## 2013-11-06 MED ORDER — IOHEXOL 350 MG/ML SOLN
100.0000 mL | Freq: Once | INTRAVENOUS | Status: AC | PRN
  Administered 2013-11-06: 100 mL via INTRAVENOUS

## 2013-11-06 MED ORDER — MORPHINE SULFATE 4 MG/ML IJ SOLN
4.0000 mg | Freq: Once | INTRAMUSCULAR | Status: AC
  Administered 2013-11-06: 4 mg via INTRAVENOUS
  Filled 2013-11-06: qty 1

## 2013-11-06 NOTE — ED Notes (Signed)
Pain in rt flank off and all week  Now her chest hurts started last hour  Rads to rt arm neck, pt is on home o2 at 2lc

## 2013-11-06 NOTE — ED Notes (Signed)
Social work at bedside.  

## 2013-11-06 NOTE — ED Provider Notes (Signed)
CSN: [id number]     Arrival date & time 11/06/13  1244 History   First MD Initiated Contact with Patient 11/06/13 1431     No chief complaint on file.    (Consider location/radiation/quality/duration/timing/severity/associated sxs/prior Treatment) HPI Comments: Patient is a 63 year old female with past medical history of hypertension and emphysema. She is on home oxygen. She presents today with complaints of pain in the right side of her chest and lateral ribs that started approximately 3 mornings ago. She denies any specific injury or trauma. She feels more short of breath and her pain is worse when she breathes and gets up to move around. She denies any fevers or chills. She denies any productive cough.  Patient is a 63 y.o. female presenting with chest pain. The history is provided by the patient.  Chest Pain Pain location:  R chest Pain quality: sharp   Pain radiates to:  R shoulder Pain radiates to the back: yes   Pain severity:  Moderate Onset quality:  Sudden Duration:  3 days Timing:  Constant Progression:  Worsening Chronicity:  New Context: breathing   Relieved by:  Nothing Worsened by:  Nothing tried   Past Medical History  Diagnosis Date  . Hypertension   . Emphysema/COPD   . Depression   . GERD (gastroesophageal reflux disease)   . Urine incontinence    Past Surgical History  Procedure Laterality Date  . Abdominal hysterectomy    . Cesarean section     Family History  Problem Relation Age of Onset  . Heart disease Father     MVP and Pics Valve  . Hypertension Father   . Depression Father     Institutionalized x's 2 years  . Bipolar disorder Father   . Heart disease Paternal Grandmother   . Heart disease Paternal Aunt   . Heart disease Paternal Uncle   . Schizophrenia Paternal Aunt   . Hypertension Sister   . Diabetes Sister   . Hyperlipidemia Sister   . Heart disease Sister 3    MI  . Heart disease Brother   . Hypertension Brother    History   Substance Use Topics  . Smoking status: Former Smoker -- 1.00 packs/day for 40 years    Types: Cigarettes    Quit date: 10/16/2011  . Smokeless tobacco: Never Used  . Alcohol Use: Yes     Comment: Occ-- Wine   OB History   Grav Para Term Preterm Abortions TAB SAB Ect Mult Living                 Review of Systems  Cardiovascular: Positive for chest pain.  All other systems reviewed and are negative.      Allergies  Review of patient's allergies indicates no known allergies.  Home Medications   Current Outpatient Rx  Name  Route  Sig  Dispense  Refill  . ALPRAZolam (XANAX) 0.5 MG tablet   Oral   Take 1 tablet (0.5 mg total) by mouth 2 (two) times daily as needed for anxiety.   60 tablet   0   . ipratropium-albuterol (DUONEB) 0.5-2.5 (3) MG/3ML SOLN   Nebulization   Take 3 mLs by nebulization 3 (three) times daily.   360 mL   0   . LORazepam (ATIVAN) 1 MG tablet   Oral   Take 0.5 mg by mouth at bedtime.          . metoprolol succinate (TOPROL-XL) 50 MG 24 hr tablet   Oral  Take 100 mg by mouth every morning. Take with or immediately following a meal.         . mometasone-formoterol (DULERA) 200-5 MCG/ACT AERO   Inhalation   Inhale 2 puffs into the lungs 2 (two) times daily.   1 Inhaler   3   . Potassium Chloride ER 20 MEQ TBCR   Oral   Take 20 mEq by mouth daily.         Marland Kitchen zolpidem (AMBIEN) 10 MG tablet   Oral   Take 10 mg by mouth at bedtime as needed for sleep.          BP 151/91  Pulse 67  Temp(Src) 97.9 F (36.6 C)  Resp 24  SpO2 95% Physical Exam  Nursing note and vitals reviewed. Constitutional: She is oriented to person, place, and time. She appears well-developed and well-nourished. No distress.  HENT:  Head: Normocephalic and atraumatic.  Mouth/Throat: Oropharynx is clear and moist.  Neck: Normal range of motion. Neck supple.  Cardiovascular: Normal rate and regular rhythm.  Exam reveals no gallop and no friction rub.   No  murmur heard. Pulmonary/Chest: Effort normal and breath sounds normal. No respiratory distress. She has no wheezes.  Abdominal: Soft. Bowel sounds are normal. She exhibits no distension. There is no tenderness. There is no rebound and no guarding.  Abdomen is obese.  Musculoskeletal: Normal range of motion. She exhibits edema.  There is 1+ pitting edema in the bilateral lower extremities.  Neurological: She is alert and oriented to person, place, and time.  Skin: Skin is warm and dry. She is not diaphoretic.    ED Course  Procedures (including critical care time) Labs Review Labs Reviewed  CBC WITH DIFFERENTIAL - Abnormal; Notable for the following:    MCH 34.7 (*)    All other components within normal limits  COMPREHENSIVE METABOLIC PANEL - Abnormal; Notable for the following:    Glucose, Bld 116 (*)    Albumin 3.1 (*)    AST 67 (*)    GFR calc non Af Amer 64 (*)    GFR calc Af Amer 74 (*)    All other components within normal limits  LIPASE, BLOOD  I-STAT TROPOININ, ED   Imaging Review Dg Chest 2 View  11/06/2013   CLINICAL DATA:  Chest pain and shortness of breath  EXAM: CHEST  2 VIEW  COMPARISON:  DG CHEST 2 VIEW dated 10/09/2013  FINDINGS: The heart size and mediastinal contours are within normal limits. Both lungs are clear. The visualized skeletal structures are unremarkable.  IMPRESSION: No active cardiopulmonary disease.   Electronically Signed   By: Skipper Cliche M.D.   On: 11/06/2013 13:39    EKG Interpretation    Date/Time:  Wednesday November 06 2013 12:48:50 EST Ventricular Rate:  74 PR Interval:  200 QRS Duration: 92 QT Interval:  400 QTC Calculation: 444 R Axis:   55 Text Interpretation:  Normal sinus rhythm Normal ECG Confirmed by DELOS  MD, Seger Jani ([id number]) on 11/06/2013 3:12:01 PM            MDM   Final diagnoses:  None    Patient is a 63 year old female with history of COPD and hypertension. She is brought for evaluation of pain in the right  side of her chest that is pleuritic in nature. Her physical examination reveals tenderness to the right chest which is reproducible. Workup reveals no evidence for cardiac ischemia as the EKG and troponin are negative. A CT scan  of the chest was obtained to rule out pulmonary embolism. This was negative. She was given pain medication and is feeling better. From a physical standpoint, I can find no acute disease process that warrants admission. There is no hypoxia and no respiratory distress. I doubt a cardiac or acute pulmonary etiology.  I had a lengthy discussion with the daughter who informed me that her mother came to live with her approximately 2 months ago. Since that time she has been very difficult to deal with, not wanting to ambulate or care for herself. She is concerned that her mother is behaving in a very manipulative fashion and is also been using her benzodiazepines more frequently than she should. She is also been apparently drinking alcohol. The daughter expressed concerns about taking her home as she states that her mother does not want to do anything and just lays around the house all day. I informed her that this was not a symptom that was qualifying for admission but did put him in touch with social services. Arrangements have been made to have home health work with her and hopefully build her stamina and motivate her to improve. She is to return if her symptoms worsen or change.    Veryl Speak, MD 11/06/13 [phone]

## 2013-11-06 NOTE — Discharge Instructions (Signed)
Home health will be in touch with you to arrange home visits.  Return to the emergency department if you develop severe chest pain, difficulty breathing, or other new and concerning symptoms.   Chest Pain (Nonspecific) It is often hard to give a specific diagnosis for the cause of chest pain. There is always a chance that your pain could be related to something serious, such as a heart attack or a blood clot in the lungs. You need to follow up with your caregiver for further evaluation. CAUSES   Heartburn.  Pneumonia or bronchitis.  Anxiety or stress.  Inflammation around your heart (pericarditis) or lung (pleuritis or pleurisy).  A blood clot in the lung.  A collapsed lung (pneumothorax). It can develop suddenly on its own (spontaneous pneumothorax) or from injury (trauma) to the chest.  Shingles infection (herpes zoster virus). The chest wall is composed of bones, muscles, and cartilage. Any of these can be the source of the pain.  The bones can be bruised by injury.  The muscles or cartilage can be strained by coughing or overwork.  The cartilage can be affected by inflammation and become sore (costochondritis). DIAGNOSIS  Lab tests or other studies, such as X-rays, electrocardiography, stress testing, or cardiac imaging, may be needed to find the cause of your pain.  TREATMENT   Treatment depends on what may be causing your chest pain. Treatment may include:  Acid blockers for heartburn.  Anti-inflammatory medicine.  Pain medicine for inflammatory conditions.  Antibiotics if an infection is present.  You may be advised to change lifestyle habits. This includes stopping smoking and avoiding alcohol, caffeine, and chocolate.  You may be advised to keep your head raised (elevated) when sleeping. This reduces the chance of acid going backward from your stomach into your esophagus.  Most of the time, nonspecific chest pain will improve within 2 to 3 days with rest and  mild pain medicine. HOME CARE INSTRUCTIONS   If antibiotics were prescribed, take your antibiotics as directed. Finish them even if you start to feel better.  For the next few days, avoid physical activities that bring on chest pain. Continue physical activities as directed.  Do not smoke.  Avoid drinking alcohol.  Only take over-the-counter or prescription medicine for pain, discomfort, or fever as directed by your caregiver.  Follow your caregiver's suggestions for further testing if your chest pain does not go away.  Keep any follow-up appointments you made. If you do not go to an appointment, you could develop lasting (chronic) problems with pain. If there is any problem keeping an appointment, you must call to reschedule. SEEK MEDICAL CARE IF:   You think you are having problems from the medicine you are taking. Read your medicine instructions carefully.  Your chest pain does not go away, even after treatment.  You develop a rash with blisters on your chest. SEEK IMMEDIATE MEDICAL CARE IF:   You have increased chest pain or pain that spreads to your arm, neck, jaw, back, or abdomen.  You develop shortness of breath, an increasing cough, or you are coughing up blood.  You have severe back or abdominal pain, feel nauseous, or vomit.  You develop severe weakness, fainting, or chills.  You have a fever. THIS IS AN EMERGENCY. Do not wait to see if the pain will go away. Get medical help at once. Call your local emergency services (911 in [place] Do not drive yourself to the hospital. MAKE SURE YOU:   Understand these instructions.  Will watch your condition.  Will get help right away if you are not doing well or get worse. Document Released: 06/08/2005 Document Revised: 11/21/2011 Document Reviewed: 04/03/2008 [facility] Patient Information 2014 [name].

## 2013-11-06 NOTE — ED Notes (Signed)
Spoke with pt and her daughter re: role of CSW/dcp, as MD reported that daughter was having difficulty managing pt at home. While pt has a qualifying stay with in the past 30 days, she is not interested in pursuing placement at this time. Pt/daughter interested in [facility] with [place].  MD informed/face to face completed.  RNCM will be notified in am.

## 2013-11-06 NOTE — ED Notes (Signed)
Social work has been asked to come consult with pt by Dr. Stark Jock; pt has some psychosocial issues to discuss with pt

## 2013-11-07 NOTE — Progress Notes (Signed)
[facility] ED CM received referral from [place] regarding Sue [name] recomendation. Pt presented to [facility] ED 2/25 with CP,and SOB.As per record patient on home oxygen. Contacted patient and Daughter Sue [name] [facility]  regarding recommendation for [facility] as plan of care. Pt and daughter verbalized understanding and in agreement with plan of care for [facility]. Offered Choice. Sue [name] was selected. Home O2 supplied by [facility]. Referral faxed to [facility] also contacted Sue [name] at [place]. Sue [name] notified me that start date may not be 2/27, Daughter was made aware she stated she is ok with the start date being later. No further ED CM needs identified.

## 2013-11-11 ENCOUNTER — Encounter: Payer: Self-pay | Admitting: Physician Assistant

## 2013-11-11 ENCOUNTER — Ambulatory Visit (INDEPENDENT_AMBULATORY_CARE_PROVIDER_SITE_OTHER): Payer: Medicare HMO | Admitting: Physician Assistant

## 2013-11-11 VITALS — BP 176/115 | HR 78 | Temp 98.4°F | Resp 22 | Ht 63.0 in | Wt 291.4 lb

## 2013-11-11 DIAGNOSIS — R109 Unspecified abdominal pain: Secondary | ICD-10-CM

## 2013-11-11 DIAGNOSIS — M545 Low back pain, unspecified: Secondary | ICD-10-CM

## 2013-11-11 DIAGNOSIS — R3989 Other symptoms and signs involving the genitourinary system: Secondary | ICD-10-CM

## 2013-11-11 DIAGNOSIS — R399 Unspecified symptoms and signs involving the genitourinary system: Secondary | ICD-10-CM

## 2013-11-11 DIAGNOSIS — I10 Essential (primary) hypertension: Secondary | ICD-10-CM

## 2013-11-11 LAB — POCT URINALYSIS DIPSTICK
Glucose, UA: NEGATIVE
Nitrite, UA: NEGATIVE
PH UA: 7.5
Spec Grav, UA: 1.01
Urobilinogen, UA: 4

## 2013-11-11 LAB — URINALYSIS, ROUTINE W REFLEX MICROSCOPIC
Bilirubin Urine: NEGATIVE
Hgb urine dipstick: NEGATIVE
Ketones, ur: NEGATIVE
Leukocytes, UA: NEGATIVE
NITRITE: NEGATIVE
PH: 7.5 (ref 5.0–8.0)
RBC / HPF: NONE SEEN (ref 0–?)
SPECIFIC GRAVITY, URINE: 1.015 (ref 1.000–1.030)
Urine Glucose: NEGATIVE
Urobilinogen, UA: 2 — AB (ref 0.0–1.0)

## 2013-11-11 LAB — CBC WITH DIFFERENTIAL/PLATELET
Basophils Absolute: 0 10*3/uL (ref 0.0–0.1)
Basophils Relative: 0.4 % (ref 0.0–3.0)
EOS ABS: 0.2 10*3/uL (ref 0.0–0.7)
EOS PCT: 3.3 % (ref 0.0–5.0)
HEMATOCRIT: 43.7 % (ref 36.0–46.0)
Hemoglobin: 14.6 g/dL (ref 12.0–15.0)
LYMPHS ABS: 2.6 10*3/uL (ref 0.7–4.0)
Lymphocytes Relative: 50.1 % — ABNORMAL HIGH (ref 12.0–46.0)
MCHC: 33.4 g/dL (ref 30.0–36.0)
MCV: 100.2 fl — AB (ref 78.0–100.0)
MONO ABS: 0.4 10*3/uL (ref 0.1–1.0)
Monocytes Relative: 7.1 % (ref 3.0–12.0)
Neutro Abs: 2 10*3/uL (ref 1.4–7.7)
Neutrophils Relative %: 39.1 % — ABNORMAL LOW (ref 43.0–77.0)
PLATELETS: 257 10*3/uL (ref 150.0–400.0)
RBC: 4.36 Mil/uL (ref 3.87–5.11)
RDW: 13.4 % (ref 11.5–14.6)
WBC: 5.1 10*3/uL (ref 4.5–10.5)

## 2013-11-11 LAB — COMPREHENSIVE METABOLIC PANEL
ALK PHOS: 81 U/L (ref 39–117)
ALT: 26 U/L (ref 0–35)
AST: 49 U/L — AB (ref 0–37)
Albumin: 3.2 g/dL — ABNORMAL LOW (ref 3.5–5.2)
BILIRUBIN TOTAL: 0.8 mg/dL (ref 0.3–1.2)
BUN: 7 mg/dL (ref 6–23)
CO2: 26 mEq/L (ref 19–32)
Calcium: 8.3 mg/dL — ABNORMAL LOW (ref 8.4–10.5)
Chloride: 108 mEq/L (ref 96–112)
Creatinine, Ser: 1 mg/dL (ref 0.4–1.2)
GFR: 73.26 mL/min (ref >60.00)
Glucose, Bld: 84 mg/dL (ref 70–99)
Potassium: 3.6 mEq/L (ref 3.5–5.1)
Sodium: 138 mEq/L (ref 135–145)
Total Protein: 7.9 g/dL (ref 6.0–8.3)

## 2013-11-11 MED ORDER — CIPROFLOXACIN HCL 500 MG PO TABS: 500.0000 mg | ORAL_TABLET | Freq: Two times a day (BID) | ORAL | Status: DC

## 2013-11-11 NOTE — Patient Instructions (Addendum)
Please go to lab.  I will call you with your results.  Please proceed to the [place] imaging department for CT scan.  They will know you are coming.  Please take your Metoprolol as directed.  You run the risk of having a heart attack or stroke by not taking your medications.  Increase your fluid intake.  Take the antibiotic as directed.  Follow-up with Dr. Etter Sjogren in 1-2 weeks for a blood pressure recheck. Please schedule an appointment with Dr. Melvyn Novas.

## 2013-11-11 NOTE — Progress Notes (Signed)
Patient presents to clinic today for ER follow-up.   Patient was seen in the ER on 11/06/13 c/o chest pain radiating to her back.  Workup including troponin, lipase, CBC, CMP, UA, CXR, CT chest and EKG were all unremarkable for cardiopulmonary cause of patient's symptoms.  Patient's chest pain was reproducible upon palpation.  Patient was given pain medication in ER with resolution of symptoms. Patient was discharged with diagnosis of "chest pain" and instructions to follow-up if symptoms were not improving. Of note,patient refused narcotic pain medication in ER.  Daughter also commented to physician that her mother has been living with her for 2 months and in that time her mother has been laying around refusing to do anything for herself and has started having "attention-seeking" behaviors.  Since ER visit, patient states pain has persisted.  Patient states "I have not had chest pain.  I told the ER doctor that I was having pain in my right side but they wouldn't listen."  Patient endorses pain in right side and right flank that is radiating across her mid back.  Also endorses left flank pain since this morning.  Patient still having some shortness of breath secondary to COPD and chronic respiratory failure.  Patient has PFTs pending with Pulmonology.  Patient denies abdominal pain, suprapubic pressure or tenderness.  Endorses some mild urinary urgency and frequency with dribbling.  Denies hematuria, dysuria, nausea, vomiting, fever or chills.  Denies history of nephrolithiasis. Patient states her pain is constant and sometimes worse with movement.  Denies injury or trauma.  Patient is sedentary.  Has noted some bilateral lower extremity swelling.  Patient denies recent surgery or prolonged immobilization.  Again, recent CT was negative for acute cardiopulmonary process including PE.  O2 initially 90% but increased after patient's O2 placed on 2L/min which is recommended level per Pulmonology. Patient's BP is  elevated at 176/115.  Patient with history of hypertension.  Currently prescribed Toprol.  Patient states she has not taken her medication in a few days.  Denies chest pain, palpitations, increased SOB, LH or dizziness.   Past Medical History  Diagnosis Date  . Hypertension   . Emphysema/COPD   . Depression   . GERD (gastroesophageal reflux disease)   . Urine incontinence     Current Outpatient Prescriptions on File Prior to Visit  Medication Sig Dispense Refill  . ALPRAZolam (XANAX) 0.5 MG tablet Take 1 tablet (0.5 mg total) by mouth 2 (two) times daily as needed for anxiety.  60 tablet  0  . ipratropium-albuterol (DUONEB) 0.5-2.5 (3) MG/3ML SOLN Take 3 mLs by nebulization 3 (three) times daily.  360 mL  0  . LORazepam (ATIVAN) 1 MG tablet Take 0.5 mg by mouth at bedtime.       . metoprolol succinate (TOPROL-XL) 50 MG 24 hr tablet Take 100 mg by mouth every morning. Take with or immediately following a meal.      . Potassium Chloride ER 20 MEQ TBCR Take 20 mEq by mouth daily.      Marland Kitchen zolpidem (AMBIEN) 10 MG tablet Take 10 mg by mouth at bedtime as needed for sleep.       No current facility-administered medications on file prior to visit.    No Known Allergies  Family History  Problem Relation Age of Onset  . Heart disease Father     MVP and Pics Valve  . Hypertension Father   . Depression Father     Institutionalized x's 2 years  . Bipolar disorder  Father   . Heart disease Paternal Grandmother   . Heart disease Paternal Aunt   . Heart disease Paternal Uncle   . Schizophrenia Paternal Aunt   . Hypertension Sister   . Diabetes Sister   . Hyperlipidemia Sister   . Heart disease Sister 67    MI  . Heart disease Brother   . Hypertension Brother     History   Social History  . Marital Status: Divorced    Spouse Name: N/A    Number of Children: N/A  . Years of Education: N/A   Occupational History  . disabled- Occupational hygienist Med    Social History Main Topics  .  Smoking status: Former Smoker -- 1.00 packs/day for 40 years    Types: Cigarettes    Quit date: 10/16/2011  . Smokeless tobacco: Never Used  . Alcohol Use: Yes     Comment: Occ-- Wine  . Drug Use: No  . Sexual Activity: None   Other Topics Concern  . None   Social History Narrative  . None   Review of Systems - See HPI.  All other ROS are negative.  BP 176/115  Pulse 78  Temp(Src) 98.4 F (36.9 C) (Oral)  Resp 22  Ht '5\' 3"'  (1.6 m)  Wt 291 lb 6 oz (132.167 kg)  BMI 51.63 kg/m2  SpO2 90%  Physical Exam  Vitals reviewed. Constitutional: She is oriented to person, place, and time.  Well developed, morbidly obese African American female in mild painful distress, sitting on examination table.  HENT:  Head: Normocephalic and atraumatic.  Eyes: Conjunctivae and EOM are normal. Pupils are equal, round, and reactive to light.  Neck: Neck supple.  Cardiovascular: Normal rate, regular rhythm, normal heart sounds and intact distal pulses.   Pulmonary/Chest: Effort normal and breath sounds normal. No respiratory distress. She has no wheezes. She has no rales. She exhibits no tenderness.  Questionable  L-sided CVA tenderness. Patient only endorses CVA tenderness if I ask about pain while percussing.  When patient is distracted by daughter and CVA testing is performed, she does not wince or mention pain/discomfort.  Abdominal: Soft. Bowel sounds are normal.  Obese abdomen.  BS present in all 4 Qs.  No tenderness to palpation of abdomen.  Negative murphy sign.  Musculoskeletal:       Cervical back: Normal.       Thoracic back: She exhibits tenderness. She exhibits no bony tenderness, no swelling, no edema, no deformity, no laceration, no pain, no spasm and normal pulse.       Lumbar back: Normal.  Lymphadenopathy:    She has no cervical adenopathy.  Neurological: She is alert and oriented to person, place, and time. No cranial nerve deficit.  Skin: Skin is warm and dry. No rash noted.   Psychiatric: Affect normal.    Recent Results (from the past 2160 hour(s))  CBC WITH DIFFERENTIAL     Status: Abnormal   Collection Time    10/09/13 11:35 AM      Result Value Ref Range   WBC 8.0  4.0 - 10.5 K/uL   RBC 4.57  3.87 - 5.11 MIL/uL   Hemoglobin 15.6 (*) 12.0 - 15.0 g/dL   HCT 45.8  36.0 - 46.0 %   MCV 100.2 (*) 78.0 - 100.0 fL   MCH 34.1 (*) 26.0 - 34.0 pg   MCHC 34.1  30.0 - 36.0 g/dL   RDW 12.7  11.5 - 15.5 %   Platelets 166  150 -  400 K/uL   Neutrophils Relative % 56  43 - 77 %   Neutro Abs 4.4  1.7 - 7.7 K/uL   Lymphocytes Relative 35  12 - 46 %   Lymphs Abs 2.8  0.7 - 4.0 K/uL   Monocytes Relative 7  3 - 12 %   Monocytes Absolute 0.6  0.1 - 1.0 K/uL   Eosinophils Relative 2  0 - 5 %   Eosinophils Absolute 0.1  0.0 - 0.7 K/uL   Basophils Relative 0  0 - 1 %   Basophils Absolute 0.0  0.0 - 0.1 K/uL  BASIC METABOLIC PANEL     Status: Abnormal   Collection Time    10/09/13 11:35 AM      Result Value Ref Range   Sodium 140  137 - 147 mEq/L   Potassium 3.4 (*) 3.7 - 5.3 mEq/L   Chloride 98  96 - 112 mEq/L   CO2 29  19 - 32 mEq/L   Glucose, Bld 110 (*) 70 - 99 mg/dL   BUN 10  6 - 23 mg/dL   Creatinine, Ser 1.20 (*) 0.50 - 1.10 mg/dL   Calcium 8.9  8.4 - 10.5 mg/dL   GFR calc non Af Amer 48 (*) >90 mL/min   GFR calc Af Amer 55 (*) >90 mL/min   Comment: (NOTE)     The eGFR has been calculated using the CKD EPI equation.     This calculation has not been validated in all clinical situations.     eGFR's persistently <90 mL/min signify possible Chronic Kidney     Disease.  TROPONIN I     Status: None   Collection Time    10/09/13 11:35 AM      Result Value Ref Range   Troponin I <0.30  <0.30 ng/mL   Comment:            Due to the release kinetics of cTnI,     a negative result within the first hours     of the onset of symptoms does not rule out     myocardial infarction with certainty.     If myocardial infarction is still suspected,     repeat the  test at appropriate intervals.  PRO B NATRIURETIC PEPTIDE     Status: None   Collection Time    10/09/13 11:35 AM      Result Value Ref Range   Pro B Natriuretic peptide (BNP) 67.2  0 - 125 pg/mL  INFLUENZA PANEL BY PCR (TYPE A & B, H1N1)     Status: None   Collection Time    10/09/13  2:55 PM      Result Value Ref Range   Influenza A By PCR NEGATIVE  NEGATIVE   Influenza B By PCR NEGATIVE  NEGATIVE   H1N1 flu by pcr NOT DETECTED  NOT DETECTED   Comment:            The Xpert Flu assay (FDA approved for     nasal aspirates or washes and     nasopharyngeal swab specimens), is     intended as an aid in the diagnosis of     influenza and should not be used as     a sole basis for treatment.     Performed at [facility] PANEL     Status: Abnormal   Collection Time    10/09/13  8:58 PM      Result  Value Ref Range   Sodium 135 (*) 137 - 147 mEq/L   Potassium 3.3 (*) 3.7 - 5.3 mEq/L   Chloride 95 (*) 96 - 112 mEq/L   CO2 25  19 - 32 mEq/L   Glucose, Bld 229 (*) 70 - 99 mg/dL   BUN 12  6 - 23 mg/dL   Creatinine, Ser 1.10  0.50 - 1.10 mg/dL   Calcium 8.6  8.4 - 10.5 mg/dL   Total Protein 9.4 (*) 6.0 - 8.3 g/dL   Albumin 3.2 (*) 3.5 - 5.2 g/dL   AST 46 (*) 0 - 37 U/L   ALT 24  0 - 35 U/L   Alkaline Phosphatase 88  39 - 117 U/L   Total Bilirubin 0.7  0.3 - 1.2 mg/dL   GFR calc non Af Amer 53 (*) >90 mL/min   GFR calc Af Amer 61 (*) >90 mL/min   Comment: (NOTE)     The eGFR has been calculated using the CKD EPI equation.     This calculation has not been validated in all clinical situations.     eGFR's persistently <90 mL/min signify possible Chronic Kidney     Disease.  MAGNESIUM     Status: Abnormal   Collection Time    10/09/13  8:58 PM      Result Value Ref Range   Magnesium 1.4 (*) 1.5 - 2.5 mg/dL  PHOSPHORUS     Status: Abnormal   Collection Time    10/09/13  8:58 PM      Result Value Ref Range   Phosphorus 1.6 (*) 2.3 - 4.6 mg/dL  CBC  WITH DIFFERENTIAL     Status: Abnormal   Collection Time    10/09/13  8:58 PM      Result Value Ref Range   WBC 7.7  4.0 - 10.5 K/uL   RBC 4.54  3.87 - 5.11 MIL/uL   Hemoglobin 15.9 (*) 12.0 - 15.0 g/dL   HCT 45.3  36.0 - 46.0 %   MCV 99.8  78.0 - 100.0 fL   MCH 35.0 (*) 26.0 - 34.0 pg   MCHC 35.1  30.0 - 36.0 g/dL   RDW 13.4  11.5 - 15.5 %   Platelets 182  150 - 400 K/uL   Neutrophils Relative % 76  43 - 77 %   Neutro Abs 5.9  1.7 - 7.7 K/uL   Lymphocytes Relative 23  12 - 46 %   Lymphs Abs 1.8  0.7 - 4.0 K/uL   Monocytes Relative 0 (*) 3 - 12 %   Monocytes Absolute 0.0 (*) 0.1 - 1.0 K/uL   Eosinophils Relative 0  0 - 5 %   Eosinophils Absolute 0.0  0.0 - 0.7 K/uL   Basophils Relative 0  0 - 1 %   Basophils Absolute 0.0  0.0 - 0.1 K/uL  APTT     Status: None   Collection Time    10/09/13  8:58 PM      Result Value Ref Range   aPTT 31  24 - 37 seconds  PROTIME-INR     Status: None   Collection Time    10/09/13  8:58 PM      Result Value Ref Range   Prothrombin Time 14.5  11.6 - 15.2 seconds   INR 1.15  0.00 - 1.49  TSH     Status: None   Collection Time    10/09/13  8:58 PM      Result Value  Ref Range   TSH 1.572  0.350 - 4.500 uIU/mL   Comment: Performed at [facility]     Status: None   Collection Time    10/09/13 10:30 PM      Result Value Ref Range   Specimen Description URINE, RANDOM     Special Requests NONE     Culture  Setup Time       Value: 10/10/2013 00:05     Performed at [facility]       Value: NO GROWTH     Performed at [facility]   Culture       Value: NO GROWTH     Performed at [facility]   Report Status 10/10/2013 FINAL    LEGIONELLA ANTIGEN, URINE     Status: None   Collection Time    10/09/13 10:30 PM      Result Value Ref Range   Specimen Description URINE, RANDOM     Special Requests NONE     Legionella Antigen, Urine       Value: Negative for Legionella  pneumophilia serogroup 1     Performed at [facility]   Report Status 10/10/2013 FINAL    STREP PNEUMONIAE URINARY ANTIGEN     Status: None   Collection Time    10/09/13 10:30 PM      Result Value Ref Range   Strep Pneumo Urinary Antigen NEGATIVE  NEGATIVE   Comment:            Infection due to S. pneumoniae     cannot be absolutely ruled out     since the antigen present     may be below the detection limit     of the test.  COMPREHENSIVE METABOLIC PANEL     Status: Abnormal   Collection Time    10/10/13  7:09 AM      Result Value Ref Range   Sodium 136 (*) 137 - 147 mEq/L   Potassium 3.6 (*) 3.7 - 5.3 mEq/L   Chloride 98  96 - 112 mEq/L   CO2 24  19 - 32 mEq/L   Glucose, Bld 164 (*) 70 - 99 mg/dL   BUN 19  6 - 23 mg/dL   Creatinine, Ser 1.11 (*) 0.50 - 1.10 mg/dL   Calcium 8.4  8.4 - 10.5 mg/dL   Total Protein 8.7 (*) 6.0 - 8.3 g/dL   Albumin 3.0 (*) 3.5 - 5.2 g/dL   AST 36  0 - 37 U/L   ALT 21  0 - 35 U/L   Alkaline Phosphatase 86  39 - 117 U/L   Total Bilirubin 0.6  0.3 - 1.2 mg/dL   GFR calc non Af Amer 52 (*) >90 mL/min   GFR calc Af Amer 61 (*) >90 mL/min   Comment: (NOTE)     The eGFR has been calculated using the CKD EPI equation.     This calculation has not been validated in all clinical situations.     eGFR's persistently <90 mL/min signify possible Chronic Kidney     Disease.  CBC     Status: Abnormal   Collection Time    10/10/13  7:09 AM      Result Value Ref Range   WBC 12.6 (*) 4.0 - 10.5 K/uL   RBC 4.42  3.87 - 5.11 MIL/uL   Hemoglobin 15.3 (*) 12.0 - 15.0 g/dL   HCT 44.3  36.0 - 46.0 %   MCV 100.2 (*) 78.0 - 100.0 fL   MCH 34.6 (*) 26.0 - 34.0 pg   MCHC 34.5  30.0 - 36.0 g/dL   RDW 13.5  11.5 - 15.5 %   Platelets 174  150 - 400 K/uL  CULTURE, EXPECTORATED SPUTUM-ASSESSMENT     Status: None   Collection Time    10/10/13  8:12 AM      Result Value Ref Range   Specimen Description SPUTUM     Special Requests NONE     Sputum evaluation        Value: MICROSCOPIC FINDINGS SUGGEST THAT THIS SPECIMEN IS NOT REPRESENTATIVE OF LOWER RESPIRATORY SECRETIONS. PLEASE RECOLLECT.     CALLED TO Q HOBLEY,RN 10/10/13 0915 BY K SCHULTZ   Report Status 10/10/2013 FINAL    GLUCOSE, CAPILLARY     Status: Abnormal   Collection Time    10/11/13 12:06 AM      Result Value Ref Range   Glucose-Capillary 194 (*) 70 - 99 mg/dL  HEMOGLOBIN A1C     Status: Abnormal   Collection Time    10/11/13  5:48 AM      Result Value Ref Range   Hemoglobin A1C 6.1 (*) <5.7 %   Comment: (NOTE)                                                                               According to the ADA Clinical Practice Recommendations for 2011, when     HbA1c is used as a screening test:      >=6.5%   Diagnostic of Diabetes Mellitus               (if abnormal result is confirmed)     5.7-6.4%   Increased risk of developing Diabetes Mellitus     References:Diagnosis and Classification of Diabetes Mellitus,Diabetes     KGMW,1027,25(DGUYQ 1):S62-S69 and Standards of Medical Care in             Diabetes - 2011,Diabetes Care,2011,34 (Suppl 1):S11-S61.   Mean Plasma Glucose 128 (*) <117 mg/dL   Comment: Performed at [facility]     Status: Abnormal   Collection Time    10/11/13  5:48 AM      Result Value Ref Range   Sodium 139  137 - 147 mEq/L   Potassium 3.7  3.7 - 5.3 mEq/L   Chloride 100  96 - 112 mEq/L   CO2 26  19 - 32 mEq/L   Glucose, Bld 142 (*) 70 - 99 mg/dL   BUN 19  6 - 23 mg/dL   Creatinine, Ser 1.03  0.50 - 1.10 mg/dL   Calcium 8.0 (*) 8.4 - 10.5 mg/dL   GFR calc non Af Amer 57 (*) >90 mL/min   GFR calc Af Amer 67 (*) >90 mL/min   Comment: (NOTE)     The eGFR has been calculated using the CKD EPI equation.     This calculation has not been validated in all clinical situations.     eGFR's persistently <90 mL/min signify possible Chronic Kidney     Disease.  PRO B NATRIURETIC PEPTIDE  Status: None   Collection Time     10/11/13  5:48 AM      Result Value Ref Range   Pro B Natriuretic peptide (BNP) 57.0  0 - 125 pg/mL  GLUCOSE, CAPILLARY     Status: Abnormal   Collection Time    10/11/13  7:45 AM      Result Value Ref Range   Glucose-Capillary 118 (*) 70 - 99 mg/dL  GLUCOSE, CAPILLARY     Status: None   Collection Time    10/12/13  7:38 AM      Result Value Ref Range   Glucose-Capillary 92  70 - 99 mg/dL  BASIC METABOLIC PANEL     Status: Abnormal   Collection Time    10/15/13  3:00 PM      Result Value Ref Range   Sodium 138  135 - 145 mEq/L   Potassium 3.7  3.5 - 5.1 mEq/L   Chloride 106  96 - 112 mEq/L   CO2 26  19 - 32 mEq/L   Glucose, Bld 89  70 - 99 mg/dL   BUN 16  6 - 23 mg/dL   Creatinine, Ser 1.0  0.4 - 1.2 mg/dL   Calcium 8.1 (*) 8.4 - 10.5 mg/dL   GFR 69.23  >60.00 mL/min  CBC WITH DIFFERENTIAL     Status: Abnormal   Collection Time    10/15/13  3:00 PM      Result Value Ref Range   WBC 10.9 (*) 4.5 - 10.5 K/uL   RBC 4.41  3.87 - 5.11 Mil/uL   Hemoglobin 14.9  12.0 - 15.0 g/dL   HCT 45.3  36.0 - 46.0 %   MCV 102.7 (*) 78.0 - 100.0 fl   MCHC 32.8  30.0 - 36.0 g/dL   RDW 14.0  11.5 - 14.6 %   Platelets 154.0  150.0 - 400.0 K/uL   Neutrophils Relative %   (*) 43.0 - 77.0 %   Value: 82.6 Specimen too old for Manual Differential to be performed.   Lymphocytes Relative 16.6  12.0 - 46.0 %   Monocytes Relative 0.4 (*) 3.0 - 12.0 %   Eosinophils Relative 0.0  0.0 - 5.0 %   Basophils Relative 0.4  0.0 - 3.0 %   Neutro Abs 9.0 (*) 1.4 - 7.7 K/uL   Lymphs Abs 1.8  0.7 - 4.0 K/uL   Monocytes Absolute 0.0 (*) 0.1 - 1.0 K/uL   Eosinophils Absolute 0.0  0.0 - 0.7 K/uL   Basophils Absolute 0.0  0.0 - 0.1 K/uL  HEPATIC FUNCTION PANEL     Status: Abnormal   Collection Time    10/15/13  3:00 PM      Result Value Ref Range   Total Bilirubin 0.6  0.3 - 1.2 mg/dL   Bilirubin, Direct 0.1  0.0 - 0.3 mg/dL   Alkaline Phosphatase 66  39 - 117 U/L   AST 35  0 - 37 U/L   ALT 27  0 - 35 U/L    Total Protein 7.3  6.0 - 8.3 g/dL   Albumin 3.0 (*) 3.5 - 5.2 g/dL  LIPID PANEL     Status: Abnormal   Collection Time    10/15/13  3:00 PM      Result Value Ref Range   Cholesterol 134  0 - 200 mg/dL   Comment: ATP III Classification       Desirable:  < 200 mg/dL  Borderline High:  200 - 239 mg/dL          High:  > = 240 mg/dL   Triglycerides 65.0  0.0 - 149.0 mg/dL   Comment: Normal:  <150 mg/dLBorderline High:  150 - 199 mg/dL   HDL 38.80 (*) >39.00 mg/dL   VLDL 13.0  0.0 - 40.0 mg/dL   LDL Cholesterol 82  0 - 99 mg/dL   Total CHOL/HDL Ratio 3     Comment:                Men          Women1/2 Average Risk     3.4          3.3Average Risk          5.0          4.42X Average Risk          9.6          7.13X Average Risk          15.0          11.0                      POCT URINALYSIS DIPSTICK     Status: None   Collection Time    10/15/13  4:53 PM      Result Value Ref Range   Color, UA yellow     Clarity, UA clear     Glucose, UA Neg     Bilirubin, UA Neg     Ketones, UA Neg     Spec Grav, UA 1.010     Blood, UA Neg     pH, UA 7.5     Protein, UA Neg     Urobilinogen, UA 0.2     Nitrite, UA Neg     Leukocytes, UA Negative    CBC WITH DIFFERENTIAL     Status: Abnormal   Collection Time    11/06/13 12:55 PM      Result Value Ref Range   WBC 6.0  4.0 - 10.5 K/uL   RBC 4.29  3.87 - 5.11 MIL/uL   Hemoglobin 14.9  12.0 - 15.0 g/dL   HCT 42.2  36.0 - 46.0 %   MCV 98.4  78.0 - 100.0 fL   MCH 34.7 (*) 26.0 - 34.0 pg   MCHC 35.3  30.0 - 36.0 g/dL   RDW 13.2  11.5 - 15.5 %   Platelets 225  150 - 400 K/uL   Neutrophils Relative % 47  43 - 77 %   Neutro Abs 2.8  1.7 - 7.7 K/uL   Lymphocytes Relative 44  12 - 46 %   Lymphs Abs 2.6  0.7 - 4.0 K/uL   Monocytes Relative 8  3 - 12 %   Monocytes Absolute 0.5  0.1 - 1.0 K/uL   Eosinophils Relative 2  0 - 5 %   Eosinophils Absolute 0.1  0.0 - 0.7 K/uL   Basophils Relative 0  0 - 1 %   Basophils Absolute 0.0  0.0 -  0.1 K/uL  COMPREHENSIVE METABOLIC PANEL     Status: Abnormal   Collection Time    11/06/13 12:55 PM      Result Value Ref Range   Sodium 139  137 - 147 mEq/L   Potassium 3.9  3.7 - 5.3 mEq/L   Chloride 100  96 - 112 mEq/L   CO2 27  19 - 32  mEq/L   Glucose, Bld 116 (*) 70 - 99 mg/dL   BUN 9  6 - 23 mg/dL   Creatinine, Ser 0.94  0.50 - 1.10 mg/dL   Calcium 9.5  8.4 - 10.5 mg/dL   Total Protein 8.2  6.0 - 8.3 g/dL   Albumin 3.1 (*) 3.5 - 5.2 g/dL   AST 67 (*) 0 - 37 U/L   ALT 33  0 - 35 U/L   Alkaline Phosphatase 101  39 - 117 U/L   Total Bilirubin 0.6  0.3 - 1.2 mg/dL   GFR calc non Af Amer 64 (*) >90 mL/min   GFR calc Af Amer 74 (*) >90 mL/min   Comment: (NOTE)     The eGFR has been calculated using the CKD EPI equation.     This calculation has not been validated in all clinical situations.     eGFR's persistently <90 mL/min signify possible Chronic Kidney     Disease.  Randolm Idol, ED     Status: None   Collection Time    11/06/13  1:04 PM      Result Value Ref Range   Troponin i, poc 0.00  0.00 - 0.08 ng/mL   Comment 3            Comment: Due to the release kinetics of cTnI,     a negative result within the first hours     of the onset of symptoms does not rule out     myocardial infarction with certainty.     If myocardial infarction is still suspected,     repeat the test at appropriate intervals.  LIPASE, BLOOD     Status: None   Collection Time    11/06/13  3:09 PM      Result Value Ref Range   Lipase 40  11 - 59 U/L  CBC WITH DIFFERENTIAL     Status: Abnormal   Collection Time    11/11/13  1:53 PM      Result Value Ref Range   WBC 5.1  4.5 - 10.5 K/uL   RBC 4.36  3.87 - 5.11 Mil/uL   Hemoglobin 14.6  12.0 - 15.0 g/dL   HCT 43.7  36.0 - 46.0 %   MCV 100.2 (*) 78.0 - 100.0 fl   MCHC 33.4  30.0 - 36.0 g/dL   RDW 13.4  11.5 - 14.6 %   Platelets 257.0  150.0 - 400.0 K/uL   Neutrophils Relative % 39.1 (*) 43.0 - 77.0 %   Lymphocytes Relative 50.1 (*) 12.0  - 46.0 %   Monocytes Relative 7.1  3.0 - 12.0 %   Eosinophils Relative 3.3  0.0 - 5.0 %   Basophils Relative 0.4  0.0 - 3.0 %   Neutro Abs 2.0  1.4 - 7.7 K/uL   Lymphs Abs 2.6  0.7 - 4.0 K/uL   Monocytes Absolute 0.4  0.1 - 1.0 K/uL   Eosinophils Absolute 0.2  0.0 - 0.7 K/uL   Basophils Absolute 0.0  0.0 - 0.1 K/uL  COMPREHENSIVE METABOLIC PANEL     Status: Abnormal   Collection Time    11/11/13  1:53 PM      Result Value Ref Range   Sodium 138  135 - 145 mEq/L   Potassium 3.6  3.5 - 5.1 mEq/L   Chloride 108  96 - 112 mEq/L   CO2 26  19 - 32 mEq/L   Glucose, Bld 84  70 - 99 mg/dL  BUN 7  6 - 23 mg/dL   Creatinine, Ser 1.0  0.4 - 1.2 mg/dL   Total Bilirubin 0.8  0.3 - 1.2 mg/dL   Alkaline Phosphatase 81  39 - 117 U/L   AST 49 (*) 0 - 37 U/L   ALT 26  0 - 35 U/L   Total Protein 7.9  6.0 - 8.3 g/dL   Albumin 3.2 (*) 3.5 - 5.2 g/dL   Calcium 8.3 (*) 8.4 - 10.5 mg/dL   GFR 73.26  >60.00 mL/min  URINALYSIS, ROUTINE W REFLEX MICROSCOPIC     Status: Abnormal   Collection Time    11/11/13  1:53 PM      Result Value Ref Range   Color, Urine YELLOW  Yellow;Lt. Yellow   APPearance CLEAR  Clear   Specific Gravity, Urine 1.015  1.000-1.030   pH 7.5  5.0 - 8.0   Total Protein, Urine TRACE (*) Negative   Urine Glucose NEGATIVE  Negative   Ketones, ur NEGATIVE  Negative   Bilirubin Urine NEGATIVE  Negative   Hgb urine dipstick NEGATIVE  Negative   Urobilinogen, UA 2.0 (*) 0.0 - 1.0   Leukocytes, UA NEGATIVE  Negative   Nitrite NEGATIVE  Negative   WBC, UA 0-2/hpf  0-2/hpf   RBC / HPF none seen  0-2/hpf   Squamous Epithelial / LPF Rare(0-4/hpf)  Rare(0-4/hpf)   Renal Epithel, UA Rare(0-4/hpf) (*) None   Bacteria, UA Rare(<10/hpf) (*) None   Yeast, UA Presence of (*) None  POCT URINALYSIS DIPSTICK     Status: None   Collection Time    11/11/13  2:06 PM      Result Value Ref Range   Color, UA orange     Comment: dark   Clarity, UA bubbly     Glucose, UA neg     Bilirubin, UA  small     Ketones, UA trace     Spec Grav, UA 1.010     Blood, UA trace     pH, UA 7.5     Protein, UA trace     Urobilinogen, UA 4.0     Nitrite, UA neg     Leukocytes, UA Trace     Assessment/Plan: Flank pain W/ questionable left-sided CVA tenderness. Patient's symptoms seem to change depending on questions asked and CVA tenderness not present if patient is not asked about pain before I perform percussion over CVA. Urine dipstick with trace leukocytes and trace blood. Will empirically treat with Cipro.  Will send for culture.  Will extend course if indicated by culture. Will obtain CBC, CMP, CT Abdomen/pelvis to r/o stone or obstruction.  ER workup has r/o cardiopulmonary cause of symptoms.  Patient refuses Rx pain medication.   Increase fluids. Alternate tylenol and ibuprofen for pain. I do feel giving patient's morbid obesity that there is a muscular component to symptoms.  However, need further workup to rule out other acute process.  Follow-up in 1 week. Patient and daughter instructed to proceed immediately to ER if symptoms acutely worsen, or if she develops nausea, vomiting, chills or fever.  UTI symptoms Trace blood and leukocytes.  Will empirically treat with Cipro.  Send for micro and culture.  Will extend course if indicated.  Will obtain CBC, CMP and CT Abdomen/Pelvis due to flank pain.  Favor possible nephrolithiasis or muscular flank pain over Pyelo.  Increase fluids.  Cranberry supplement. Follow-up in 1 week.  HTN (hypertension) BP significantly elevated.  Patient has not taken meds  in a few days. Asymptomatic for chest pain, palpitations, LH or dizziness.  No headache or blurry vision.  Some DOE but unchanged from baseline.  Restart Toprol as directed.  F/U in 1 week for BP recheck.  Patient and daughter educated on risk factors associated with noncompliance with antihypertensive medications including renal damage, MI, CVA, etc.  Patient and daughter voice understanding.  Patient  agrees to be compliant with medications.   I spent > 30 minutes with the patient.

## 2013-11-12 ENCOUNTER — Encounter: Payer: Self-pay | Admitting: Internal Medicine

## 2013-11-12 ENCOUNTER — Ambulatory Visit (INDEPENDENT_AMBULATORY_CARE_PROVIDER_SITE_OTHER): Payer: Medicare HMO | Admitting: Internal Medicine

## 2013-11-12 VITALS — BP 138/90 | HR 69 | Ht 62.0 in | Wt 297.0 lb

## 2013-11-12 DIAGNOSIS — J449 Chronic obstructive pulmonary disease, unspecified: Secondary | ICD-10-CM

## 2013-11-12 DIAGNOSIS — R109 Unspecified abdominal pain: Secondary | ICD-10-CM

## 2013-11-12 DIAGNOSIS — R399 Unspecified symptoms and signs involving the genitourinary system: Secondary | ICD-10-CM | POA: Insufficient documentation

## 2013-11-12 DIAGNOSIS — J9610 Chronic respiratory failure, unspecified whether with hypoxia or hypercapnia: Secondary | ICD-10-CM

## 2013-11-12 MED ORDER — TRAMADOL HCL 50 MG PO TABS: 50.0000 mg | ORAL_TABLET | Freq: Four times a day (QID) | ORAL | Status: DC | PRN

## 2013-11-12 NOTE — Progress Notes (Signed)
Incoming call from patient's daughter Dianne Dun Burn's concerning not receiving a call from [facility] to schedule home visit. Contacted Lacey at [facility] she will contact patient at number listed in record to set up date and time of visit. Pt's daughter also requesting to speak with CSW Renita Papa. Will leave Leveda Anna message to return cal. No further ED CM needs identified.

## 2013-11-12 NOTE — Assessment & Plan Note (Signed)
Trace blood and leukocytes.  Will empirically treat with Cipro.  Send for micro and culture.  Will extend course if indicated.  Will obtain CBC, CMP and CT Abdomen/Pelvis due to flank pain.  Favor possible nephrolithiasis or muscular flank pain over Pyelo.  Increase fluids.  Cranberry supplement. Follow-up in 1 week.

## 2013-11-12 NOTE — Progress Notes (Signed)
Subjective:    Patient ID: Sue [name], female    DOB: 18-Mar-1951  MRN: [id number]    Brief patient profile:  36 yobf quit smoking 2013 when  dx with copd/emphysema by [facility] doctor with doe on exertion at that point at wt 180 and steady wt gain of about 100lb assoc with increased doe x room to room and difficulty with housework then admitted with acute resp failure/ acute renal failure on acei  Admit date: 10/09/2013  Discharge date: 10/12/2013   Recommendations for Outpatient Follow-up:  1. Pulmonology follow up in 1 - 2 weeks. 2. BMET at PCP in 1-2 weeks. 3. Started on home O2- 2 L at the time 4. Counseled regarding importance of weight loss 5. May need referral to sleep lab  Discharge Diagnoses:  Principal Problem:  Acute respiratory failure with hypoxia  Active Problems:  Acute renal failure  HTN (hypertension)  Hypokalemia     History of present illness at the time of admission:  63 year old female with past medical history of hypertension and anxiety who presented to [facility] on 10/09/2013 with worsening shortness of breath and associated productive cough ongoing for past few weeks prior to this admission. Pt reported having copious amount of greenish sputum and additional subjective fevers at home. No associated chest pain, palpitations.  In ED, BP was 127/79, HR 79, Tmax 99.5 F and oxygen saturation of 83% on room air. Oxygen saturation improved to 99% with 2 L [place] oxygen support.   Hospital Course:  Acute respiratory failure with hypoxia  -Secondary to COPD exacerbation with bronchitis, and obesity.  - Patient was admitted, and started on empiric Azithromycin and Rocephin, was also placed on Solu-Medrol. She improved rapidly, Solu-Medrol has been changed to prednisone, this will be tapered on discharge. She will be transitioned from Rocephin and Zithromax to Levaquin. Home oxygen saturation screen was done, he can qualify for home O2, this will be arranged by case  management prior to discharge.  - Please note, by the day of discharge, patient ambulating, and has clear lungs on exam.  -Influenza PCR is negative.  -Will discharge on quick prednisone taper, Levaquin, Dulera and home O2  -Strongly recommend pulmonary follow up. Patient has been advised to call her primary pulmonologist and make a quick followup appointment in the next 1-2 weeks. She has claimed understanding  Dyspnea on Exertion  - This is a chronic issue, this has apparently been going on for the past number of months. She has had followup with her primary pulmonologist for this.  -Suspect this to be multifactorial from underlying COPD, obesity hypoventilation syndrome  -BNP is WNL  -2D echo shows normal wall motion and grade 1 diastolic dysfunction. Lower extremity Dopplers were negative.  -Her weight is likely playing a significant roles in her symptoms- has been encouraged and counseled regarding the importance of weight loss.  - Qualify for home oxygen-this will be arranged on discharge  Acute kidney injury  -Likely due to [place] which has been discontinued. However we'll resume low dose HCTZ on discharge.  -Resolved  - His monitor electrolytes periodically in the outpatient setting  HTN (hypertension)  -Stable with good control  -Lisinopril discontinued due to chronic cough.  -Continue Metoprolol 100mg  Q day and low dose HCTZ on discharge  Hypokalemia  -Resolved with oral supplementation.  -Magnesium repleted as well.  Hyperglycemia  -likely due to steroids  -Hgb A1C is 6.1 (pre-diabetic)  Morbid obesity  -Counseled regarding the importance of weight  loss  -Nutrition evaluation completed. Recommendations made.  Procedures:  2 D Echo Study Conclusions - Left ventricle: The cavity size was normal. Wall thickness was increased in a pattern of mild LVH. Systolic function was normal. The estimated ejection fraction was in the range of 55% to 60%. Wall motion was normal;  there were no regional wall motion abnormalities. Doppler parameters are consistent with abnormal left ventricular relaxation (grade 1 diastolic dysfunction). - Right atrium: The atrium was mildly dilated.  Venous Doppler  Preliminary report: Bilateral: No evidence of DVT, superficial thrombosis, or Baker's Cyst.      10/23/2013 1st [facility] office visit/ Danis P[place]bleton cc chronic orthopnea x sev years and doe and now on 02 since d/c above still gets let off at curb on dulera bid and duoneb qid s much variability, assoc with min daytime dry cough better off acei, still some HB symptoms rec dulera 200 Take 2 puffs first thing in am and then another 2 puffs about 12 hours later.  Only use the duoneb if you need it to catch your breath up to 4 x daily  In the meantime wear the 02 24/7 and especially with exercise a minimum of 30 min per day   11/12/2013 f/u ov/Demarrion Meiklejohn re: ? Copd/ not able to take [place]  Patient presents with  . Follow-up    Breathing unchanged since last OV-- (R) lower side pain (abdominal), now going into left side   no change sob off on dulera vs off New back pain first rad to R flank now left > CTa neg 11/06/13 and rx as uti with cipro, no better  No obvious day to day or daytime variabilty or assoc chronic cough or chest tightness, subjective wheeze overt sinus or hb symptoms. No unusual exp hx or h/o childhood pna/ asthma or knowledge of premature birth.  Sleeping ok without nocturnal  or early am exacerbation  of respiratory  c/o's or need for noct saba. Also denies any obvious fluctuation of symptoms with weather or environmental changes or other aggravating or alleviating factors except as outlined above   Current Medications, Allergies, Complete Past Medical History, Past Surgical History, Family History, and Social History were reviewed in [employer] record.  ROS  The following are not active complaints unless bolded sore  throat, dysphagia, dental problems, itching, sneezing,  nasal congestion or excess/ purulent secretions, ear ache,   fever, chills, sweats, unintended wt loss, pleuritic or exertional cp, hemoptysis,  orthopnea pnd or leg swelling, presyncope, palpitations, heartburn, abdominal pain, anorexia, nausea, vomiting, diarrhea  or change in bowel or urinary habits, change in stools or urine, dysuria,hematuria,  rash, arthralgias, visual complaints, headache, numbness weakness or ataxia or problems with walking or coordination,  change in mood/affect or memory.                           Objective:   Physical Exam    11/12/2013         297  Wt Readings from Last 3 Encounters:  10/23/13 294 lb (133.358 kg)  10/15/13 286 lb (129.729 kg)  10/12/13 290 lb 12.6 oz (131.9 kg)      HEENT: nl dentition, turbinates, and orophanx. Nl external ear canals without cough reflex   NECK :  without JVD/Nodes/TM/ nl carotid upstrokes bilaterally   LUNGS: no acc muscle use, clear to A and P bilaterally without cough on insp or exp maneuvers  CV:  RRR  no s3 or murmur or increase in P2, no edema   ABD:  soft and nontender with nl excursion in the supine position. No bruits or organomegaly, bowel sounds nl  MS:  warm without deformities, calf tenderness, cyanosis or clubbing        CTa chest 11/06/13  No pulmonary embolus. Emphysema. No focal pneumonia.         Assessment & Plan:

## 2013-11-12 NOTE — Patient Instructions (Addendum)
Ibuprofen up to 800 mg with meals as needed for pain  If not better > tramadol 50 up to every 4 hours as needed if ibuprofen not helping  Use incentive spirometer as much as possibe  Keep appt for pfts

## 2013-11-12 NOTE — Assessment & Plan Note (Addendum)
W/ questionable left-sided CVA tenderness. Patient's symptoms seem to change depending on questions asked and CVA tenderness not present if patient is not asked about pain before I perform percussion over CVA. Urine dipstick with trace leukocytes and trace blood. Will empirically treat with Cipro.  Will send for culture.  Will extend course if indicated by culture. Will obtain CBC, CMP, CT Abdomen/pelvis to r/o stone or obstruction.  ER workup has r/o cardiopulmonary cause of symptoms.  Patient refuses Rx pain medication.   Increase fluids. Alternate tylenol and ibuprofen for pain. I do feel giving patient's morbid obesity that there is a muscular component to symptoms.  However, need further workup to rule out other acute process.  Follow-up in 1 week. Patient and daughter instructed to proceed immediately to ER if symptoms acutely worsen, or if she develops nausea, vomiting, chills or fever.

## 2013-11-12 NOTE — Assessment & Plan Note (Signed)
BP significantly elevated.  Patient has not taken meds in a few days. Asymptomatic for chest pain, palpitations, LH or dizziness.  No headache or blurry vision.  Some DOE but unchanged from baseline.  Restart Toprol as directed.  F/U in 1 week for BP recheck.  Patient and daughter educated on risk factors associated with noncompliance with antihypertensive medications including renal damage, MI, CVA, etc.  Patient and daughter voice understanding.  Patient agrees to be compliant with medications.

## 2013-11-14 ENCOUNTER — Ambulatory Visit (HOSPITAL_BASED_OUTPATIENT_CLINIC_OR_DEPARTMENT_OTHER): Payer: Medicare HMO

## 2013-11-14 NOTE — Assessment & Plan Note (Signed)
-   placed on 02 2lpm at d/c 10/12/13 s hypercarbia on bmet   Adequate control on present rx, reviewed > no change in rx needed

## 2013-11-14 NOTE — Assessment & Plan Note (Signed)
CTa chest neg 11/06/13   Does not have pleuritic features > likely [facility]p > rec trial of nsaids

## 2013-11-14 NOTE — Assessment & Plan Note (Signed)
Note emphysema on CT chest 11/06/13  Pfts pending, no better on dulera so ok to just use duoneb prn prior to pfts

## 2013-11-19 ENCOUNTER — Ambulatory Visit (HOSPITAL_BASED_OUTPATIENT_CLINIC_OR_DEPARTMENT_OTHER): Payer: Medicare HMO

## 2013-11-27 ENCOUNTER — Telehealth: Payer: Self-pay | Admitting: *Deleted

## 2013-11-27 DIAGNOSIS — I10 Essential (primary) hypertension: Secondary | ICD-10-CM

## 2013-11-27 DIAGNOSIS — F319 Bipolar disorder, unspecified: Secondary | ICD-10-CM

## 2013-11-27 DIAGNOSIS — R269 Unspecified abnormalities of gait and mobility: Secondary | ICD-10-CM

## 2013-11-27 DIAGNOSIS — M6281 Muscle weakness (generalized): Secondary | ICD-10-CM

## 2013-11-27 NOTE — Telephone Encounter (Signed)
Received fax from [place] for [place] and Plan of Care for patient. Billing sheet attached, medications verified and placed in blue folder for Dr. Etter Sjogren. JG//CMA

## 2013-11-28 ENCOUNTER — Other Ambulatory Visit: Payer: Self-pay | Admitting: Internal Medicine

## 2013-11-28 DIAGNOSIS — J9610 Chronic respiratory failure, unspecified whether with hypoxia or hypercapnia: Secondary | ICD-10-CM

## 2013-11-29 ENCOUNTER — Encounter: Payer: Self-pay | Admitting: Internal Medicine

## 2013-11-29 ENCOUNTER — Ambulatory Visit (INDEPENDENT_AMBULATORY_CARE_PROVIDER_SITE_OTHER): Payer: Medicare HMO | Admitting: Internal Medicine

## 2013-11-29 VITALS — BP 130/84 | HR 72 | Ht 62.0 in | Wt 292.0 lb

## 2013-11-29 DIAGNOSIS — J449 Chronic obstructive pulmonary disease, unspecified: Secondary | ICD-10-CM

## 2013-11-29 DIAGNOSIS — J9610 Chronic respiratory failure, unspecified whether with hypoxia or hypercapnia: Secondary | ICD-10-CM

## 2013-11-29 NOTE — Progress Notes (Signed)
Subjective:    Patient ID: Sue [name], female    DOB: 11/01/1950  MRN: [id number]    Brief patient profile:  58 yobf quit smoking 2013 when  dx with copd/emphysema by [facility] doctor with doe on exertion at that point at wt 180 and steady wt gain of about 100lb assoc with increased doe x room to room and difficulty with housework then admitted with acute resp failure/ acute renal failure on acei  Admit date: 10/09/2013  Discharge date: 10/12/2013   Recommendations for Outpatient Follow-up:  1. Pulmonology follow up in 1 - 2 weeks. 2. BMET at PCP in 1-2 weeks. 3. Started on home O2- 2 L at the time 4. Counseled regarding importance of weight loss 5. May need referral to sleep lab  Discharge Diagnoses:  Principal Problem:  Acute respiratory failure with hypoxia  Active Problems:  Acute renal failure  HTN (hypertension)  Hypokalemia     History of present illness at the time of admission:  63 year old female with past medical history of hypertension and anxiety who presented to [facility] on 10/09/2013 with worsening shortness of breath and associated productive cough ongoing for past few weeks prior to this admission. Pt reported having copious amount of greenish sputum and additional subjective fevers at home. No associated chest pain, palpitations.  In ED, BP was 127/79, HR 79, Tmax 99.5 F and oxygen saturation of 83% on room air. Oxygen saturation improved to 99% with 2 L [place] oxygen support.   Hospital Course:  Acute respiratory failure with hypoxia  -Secondary to COPD exacerbation with bronchitis, and obesity.  - Patient was admitted, and started on empiric Azithromycin and Rocephin, was also placed on Solu-Medrol. She improved rapidly, Solu-Medrol has been changed to prednisone, this will be tapered on discharge. She will be transitioned from Rocephin and Zithromax to Levaquin. Home oxygen saturation screen was done, he can qualify for home O2, this will be arranged by case  management prior to discharge.  - Please note, by the day of discharge, patient ambulating, and has clear lungs on exam.  -Influenza PCR is negative.  -Will discharge on quick prednisone taper, Levaquin, Dulera and home O2  -Strongly recommend pulmonary follow up. Patient has been advised to call her primary pulmonologist and make a quick followup appointment in the next 1-2 weeks. She has claimed understanding  Dyspnea on Exertion  - This is a chronic issue, this has apparently been going on for the past number of months. She has had followup with her primary pulmonologist for this.  -Suspect this to be multifactorial from underlying COPD, obesity hypoventilation syndrome  -BNP is WNL  -2D echo shows normal wall motion and grade 1 diastolic dysfunction. Lower extremity Dopplers were negative.  -Her weight is likely playing a significant roles in her symptoms- has been encouraged and counseled regarding the importance of weight loss.  - Qualify for home oxygen-this will be arranged on discharge  Acute kidney injury  -Likely due to [place] which has been discontinued. However we'll resume low dose HCTZ on discharge.  -Resolved  - His monitor electrolytes periodically in the outpatient setting  HTN (hypertension)  -Stable with good control  -Lisinopril discontinued due to chronic cough.  -Continue Metoprolol 100mg  Q day and low dose HCTZ on discharge  Hypokalemia  -Resolved with oral supplementation.  -Magnesium repleted as well.  Hyperglycemia  -likely due to steroids  -Hgb A1C is 6.1 (pre-diabetic)  Morbid obesity  -Counseled regarding the importance of weight  loss  -Nutrition evaluation completed. Recommendations made.  Procedures:  2 D Echo Study Conclusions - Left ventricle: The cavity size was normal. Wall thickness was increased in a pattern of mild LVH. Systolic function was normal. The estimated ejection fraction was in the range of 55% to 60%. Wall motion was normal;  there were no regional wall motion abnormalities. Doppler parameters are consistent with abnormal left ventricular relaxation (grade 1 diastolic dysfunction). - Right atrium: The atrium was mildly dilated.  Venous Doppler  Preliminary report: Bilateral: No evidence of DVT, superficial thrombosis, or Baker's Cyst.      10/23/2013 1st [facility] office visit/ Sue [name] cc chronic orthopnea x sev years and doe and now on 02 since d/c above still gets let off at curb on dulera bid and duoneb qid s much variability, assoc with min daytime dry cough better off acei, still some HB symptoms rec dulera 200 Take 2 puffs first thing in am and then another 2 puffs about 12 hours later.  Only use the duoneb if you need it to catch your breath up to 4 x daily  In the meantime wear the 02 24/7 and especially with exercise a minimum of 30 min per day   11/12/2013 f/u ov/Sue [name] re: ? Copd/ not able to take [place]  Patient presents with  . Follow-up    Breathing unchanged since last OV-- (R) lower side pain (abdominal), now going into left side   no change sob off on dulera vs off New back pain first rad to R flank now left > CTa neg 11/06/13 and rx as uti with cipro, no better rec Ibuprofen up to 800 mg with meals as needed for pain If not better > tramadol 50 up to every 4 hours as needed if ibuprofen not helping Use incentive spirometer as much as possible   11/29/2013 f/u ov/Sue [name] re: chronic resp failure  Chief Complaint  Patient presents with  . Follow-up    Breathing has improved since last OV   Not using pain meds / rare need for duoneb  Baseline wt 180 but unable to lie flat x 2 y   No obvious day to day or daytime variabilty or assoc chronic cough or chest tightness, subjective wheeze overt sinus or hb symptoms. No unusual exp hx or h/o childhood pna/ asthma or knowledge of premature birth.  Sleeping ok without nocturnal  or early am exacerbation  of respiratory  c/o's  or need for noct saba. Also denies any obvious fluctuation of symptoms with weather or environmental changes or other aggravating or alleviating factors except as outlined above   Current Medications, Allergies, Complete Past Medical History, Past Surgical History, Family History, and Social History were reviewed in [employer] record.  ROS  The following are not active complaints unless bolded sore throat, dysphagia, dental problems, itching, sneezing,  nasal congestion or excess/ purulent secretions, ear ache,   fever, chills, sweats, unintended wt loss, pleuritic or exertional cp, hemoptysis,  orthopnea pnd or leg swelling, presyncope, palpitations, heartburn, abdominal pain, anorexia, nausea, vomiting, diarrhea  or change in bowel or urinary habits, change in stools or urine, dysuria,hematuria,  rash, arthralgias, visual complaints, headache, numbness weakness or ataxia or problems with walking or coordination,  change in mood/affect or memory.                           Objective:   Physical Exam  11/12/2013         297  > 11/29/2013  292  Wt Readings from Last 3 Encounters:  10/23/13 294 lb (133.358 kg)  10/15/13 286 lb (129.729 kg)  10/12/13 290 lb 12.6 oz (131.9 kg)      HEENT: nl dentition, turbinates, and orophanx. Nl external ear canals without cough reflex   NECK :  without JVD/Nodes/TM/ nl carotid upstrokes bilaterally   LUNGS: no acc muscle use, clear to A and P bilaterally without cough on insp or exp maneuvers   CV:  RRR  no s3 or murmur or increase in P2, no edema   ABD:  soft and nontender with nl excursion in the supine position. No bruits or organomegaly, bowel sounds nl  MS:  warm without deformities, calf tenderness, cyanosis or clubbing        CTa chest 11/06/13  No pulmonary embolus. Emphysema. No focal pneumonia.         Assessment & Plan:

## 2013-11-29 NOTE — Patient Instructions (Addendum)
Keep on working on weight loss and no change on 02 recs> the goal is to keep it at 90% or better at all times but as you lose weight it may be possible to use less 02  Pulmonary follow up is as needed

## 2013-11-29 NOTE — Progress Notes (Signed)
PFT done today. 

## 2013-11-30 NOTE — Assessment & Plan Note (Signed)
-   PFT's3/20/15 wnl except DLCO 62 corrects to 75   No sign copd though she does have some emphysema on ct so reasonable to use duoneb prn here

## 2013-11-30 NOTE — Assessment & Plan Note (Signed)
-   placed on 02 2lpm at d/c 10/12/13 s hypercarbia on bmet  - 11/29/2013 reports sats 89% with exercise with PT   Adequate control on present rx, reviewed > no change in rx needed

## 2013-12-04 LAB — PULMONARY FUNCTION TEST
DL/VA % pred: 75 %
DL/VA: 3.41 ml/min/mmHg/L
DLCO UNC % PRED: 62 %
DLCO UNC: 13.47 ml/min/mmHg
FEF 25-75 Post: 1.55 L/sec
FEF 25-75 Pre: 1.11 L/sec
FEF2575-%Change-Post: 40 %
FEF2575-%Pred-Post: 82 %
FEF2575-%Pred-Pre: 58 %
FEV1-%Change-Post: 7 %
FEV1-%PRED-POST: 95 %
FEV1-%Pred-Pre: 88 %
FEV1-Post: 1.79 L
FEV1-Pre: 1.67 L
FEV1FVC-%Change-Post: 3 %
FEV1FVC-%Pred-Pre: 91 %
FEV6-%CHANGE-POST: 3 %
FEV6-%PRED-PRE: 99 %
FEV6-%Pred-Post: 103 %
FEV6-POST: 2.39 L
FEV6-Pre: 2.3 L
FEV6FVC-%CHANGE-POST: 0 %
FEV6FVC-%PRED-POST: 104 %
FEV6FVC-%Pred-Pre: 103 %
FVC-%Change-Post: 3 %
FVC-%PRED-POST: 99 %
FVC-%PRED-PRE: 95 %
FVC-PRE: 2.3 L
FVC-Post: 2.39 L
PRE FEV6/FVC RATIO: 100 %
Post FEV1/FVC ratio: 75 %
Post FEV6/FVC ratio: 100 %
Pre FEV1/FVC ratio: 72 %
RV % PRED: 95 %
RV: 1.83 L
TLC % PRED: 87 %
TLC: 4.14 L

## 2013-12-05 ENCOUNTER — Telehealth: Payer: Self-pay | Admitting: Family Medicine

## 2013-12-05 NOTE — Telephone Encounter (Signed)
Attempted to call patient back, left message on voice mail to return our call and schedule appt. It was reported that pts B/P was elevated and she been taking her sister's lasix. Strongly encouraged pt via voice mail to call the office and schedule appt to be seen asap.

## 2013-12-05 NOTE — Telephone Encounter (Signed)
Lattie Haw from [facility] called in stating that patients bp is elevated. Left arm 190/48 and right arm 190/42. Lattie Haw states that the therapist was at patients home this morning and bp was 160/110. Patient is not complaining of headaches or dizziness. Lattie Haw did say that patient's leg are swollen and patient told Lattie Haw that she had been taking her sister's medication, lasix 20mg  every other day, to help with the swelling. Lattie Haw informed patient that she should not be taking any medication that had not been prescribed to her.

## 2013-12-10 ENCOUNTER — Encounter: Payer: Self-pay | Admitting: Family Medicine

## 2013-12-10 ENCOUNTER — Ambulatory Visit (INDEPENDENT_AMBULATORY_CARE_PROVIDER_SITE_OTHER): Payer: Medicare HMO | Admitting: Family Medicine

## 2013-12-10 VITALS — BP 154/92 | HR 75 | Temp 98.1°F | Wt 295.0 lb

## 2013-12-10 DIAGNOSIS — F411 Generalized anxiety disorder: Secondary | ICD-10-CM

## 2013-12-10 DIAGNOSIS — I10 Essential (primary) hypertension: Secondary | ICD-10-CM

## 2013-12-10 DIAGNOSIS — R609 Edema, unspecified: Secondary | ICD-10-CM

## 2013-12-10 MED ORDER — ALPRAZOLAM 0.5 MG PO TABS: 0.5000 mg | ORAL_TABLET | Freq: Two times a day (BID) | ORAL | Status: DC | PRN

## 2013-12-10 MED ORDER — LOSARTAN POTASSIUM-HCTZ 50-12.5 MG PO TABS: 1.0000 | ORAL_TABLET | Freq: Every day | ORAL | Status: DC

## 2013-12-10 NOTE — Progress Notes (Signed)
  Subjective:    Patient here for follow-up of elevated blood pressure.  She is not exercising and is adherent to a low-salt diet.  Blood pressure is not well controlled at home. Cardiac symptoms: none. Patient denies: chest pain, chest pressure/discomfort, claudication, dyspnea, exertional chest pressure/discomfort, fatigue, irregular heart beat, lower extremity edema, near-syncope, orthopnea, palpitations, paroxysmal nocturnal dyspnea, syncope and tachypnea. Cardiovascular risk factors: hypertension, obesity (BMI >= 30 kg/m2) and sedentary lifestyle. Use of agents associated with hypertension: none. History of target organ damage: chronic kidney disease.  The following portions of the patient's history were reviewed and updated as appropriate: allergies, current medications, past family history, past medical history, past social history, past surgical history and problem list.  Review of Systems Pertinent items are noted in HPI.     Objective:    BP 154/92  Pulse 75  Temp(Src) 98.1 F (36.7 C) (Oral)  Wt 295 lb (133.811 kg)  SpO2 97% General appearance: alert, cooperative, appears stated age and no distress Neck: no adenopathy, no carotid bruit, no JVD, supple, symmetrical, trachea midline and thyroid not enlarged, symmetric, no tenderness/mass/nodules Lungs: clear to auscultation bilaterally Heart: S1, S2 normal Extremities: edema +1 pitting edema b/l    Assessment:    Hypertension, stage 1 . Evidence of target organ damage: chronic kidney disease.    Plan:    Medication: begin hyzaar. Dietary sodium restriction. Regular aerobic exercise. Check blood pressures 2-3 times weekly and record. Follow up: 2 weeks and as needed.

## 2013-12-10 NOTE — Progress Notes (Signed)
Pre visit review using our clinic review tool, if applicable. No additional management support is needed unless otherwise documented below in the visit note. 

## 2013-12-11 ENCOUNTER — Telehealth: Payer: Self-pay | Admitting: Family Medicine

## 2013-12-11 NOTE — Telephone Encounter (Signed)
Relevant patient education mailed to patient.  

## 2013-12-18 ENCOUNTER — Telehealth: Payer: Self-pay | Admitting: Family Medicine

## 2013-12-18 NOTE — Telephone Encounter (Signed)
Spoke with Lattie Haw from [facility] [phone]) gave okay to continue RN visits once a week for the next 4 weeks.

## 2013-12-18 NOTE — Telephone Encounter (Signed)
Sue [name] from [facility] called to see if we could extend Nursing orders for [facility]. She would like to see her one time a week for four weeks for blood pressure monitoring. Please advise.

## 2014-01-06 ENCOUNTER — Ambulatory Visit: Payer: Medicare HMO | Admitting: Family Medicine

## 2014-01-06 ENCOUNTER — Other Ambulatory Visit: Payer: Self-pay | Admitting: Family Medicine

## 2014-01-06 ENCOUNTER — Encounter: Payer: Self-pay | Admitting: Family Medicine

## 2014-01-06 ENCOUNTER — Ambulatory Visit (INDEPENDENT_AMBULATORY_CARE_PROVIDER_SITE_OTHER): Payer: Commercial Managed Care - HMO | Admitting: Family Medicine

## 2014-01-06 VITALS — BP 142/84 | HR 80 | Temp 98.0°F | Wt 284.0 lb

## 2014-01-06 DIAGNOSIS — I10 Essential (primary) hypertension: Secondary | ICD-10-CM

## 2014-01-06 DIAGNOSIS — E042 Nontoxic multinodular goiter: Secondary | ICD-10-CM

## 2014-01-06 LAB — BASIC METABOLIC PANEL
BUN: 8 mg/dL (ref 6–23)
CALCIUM: 9 mg/dL (ref 8.4–10.5)
CO2: 33 mEq/L — ABNORMAL HIGH (ref 19–32)
CREATININE: 0.9 mg/dL (ref 0.4–1.2)
Chloride: 99 mEq/L (ref 96–112)
GFR: 81.74 mL/min (ref >60.00)
GLUCOSE: 179 mg/dL — AB (ref 70–99)
Potassium: 3.2 mEq/L — ABNORMAL LOW (ref 3.5–5.1)
SODIUM: 138 meq/L (ref 135–145)

## 2014-01-06 MED ORDER — LOSARTAN POTASSIUM-HCTZ 100-25 MG PO TABS: 1.0000 | ORAL_TABLET | Freq: Every day | ORAL | Status: DC

## 2014-01-06 NOTE — Progress Notes (Signed)
Pre visit review using our clinic review tool, if applicable. No additional management support is needed unless otherwise documented below in the visit note. 

## 2014-01-06 NOTE — Patient Instructions (Signed)

## 2014-01-06 NOTE — Progress Notes (Signed)
  Subjective:    Patient here for follow-up of elevated blood pressure.  She is not exercising and is adherent to a low-salt diet.  Blood pressure is not well controlled at home. Cardiac symptoms: dyspnea. Patient denies: chest pain, chest pressure/discomfort, claudication, dyspnea, exertional chest pressure/discomfort, fatigue, irregular heart beat, lower extremity edema, near-syncope, orthopnea, palpitations, paroxysmal nocturnal dyspnea, syncope and tachypnea. Cardiovascular risk factors: hypertension, obesity (BMI >= 30 kg/m2) and sedentary lifestyle. Use of agents associated with hypertension: none. History of target organ damage: none.  The following portions of the patient's history were reviewed and updated as appropriate: allergies, current medications, past family history, past medical history, past social history, past surgical history and problem list.  Review of Systems Pertinent items are noted in HPI.     Objective:    BP 142/84  Pulse 80  Temp(Src) 98 F (36.7 C) (Oral)  Wt 284 lb (128.822 kg)  SpO2 97% General appearance: alert, cooperative, appears stated age and no distress Throat: lips, mucosa, and tongue normal; teeth and gums normal Neck: no adenopathy, supple, symmetrical, trachea midline and thyroid not enlarged, symmetric, no tenderness/mass/nodules Lungs: clear to auscultation bilaterally Heart: S1, S2 normal Extremities: edema + 1 pitting edema    Assessment:    Hypertension, stage 1 . Evidence of target organ damage: none.    Plan:    Medication: increase to hyzaar 100/25. Dietary sodium restriction. Regular aerobic exercise. Check blood pressures 2-3 times weekly and record. Follow up: 3 months and as needed.  1. HTN (hypertension)  - losartan-hydrochlorothiazide (HYZAAR) 100-25 MG per tablet; Take 1 tablet by mouth daily.  Dispense: 90 tablet; Refill: 3 - Basic metabolic panel  2 .  Edema-- inc hct to 25 mg      Check bmp  3. Headache-- maybe  from bp-- inc bp med and recheck 3 months or sooner prn

## 2014-01-13 ENCOUNTER — Telehealth: Payer: Self-pay

## 2014-01-13 NOTE — Telephone Encounter (Signed)
Call back. No answer. Left message for call back.

## 2014-01-13 NOTE — Telephone Encounter (Signed)
Confirmed appointment for tomorrow.  Asked that we call her back to complete pre visit call.

## 2014-01-14 ENCOUNTER — Encounter: Payer: Commercial Managed Care - HMO | Admitting: Family Medicine

## 2014-01-14 DIAGNOSIS — Z0289 Encounter for other administrative examinations: Secondary | ICD-10-CM

## 2014-01-16 ENCOUNTER — Other Ambulatory Visit: Payer: Self-pay

## 2014-01-16 MED ORDER — POTASSIUM CHLORIDE ER 20 MEQ PO TBCR: 20.0000 meq | EXTENDED_RELEASE_TABLET | Freq: Every day | ORAL | Status: DC

## 2014-01-16 MED ORDER — METOPROLOL SUCCINATE ER 50 MG PO TB24: 100.0000 mg | ORAL_TABLET | Freq: Every morning | ORAL | Status: DC

## 2014-01-16 NOTE — Telephone Encounter (Signed)
Call from daughter and she is request Metoprolol and potassium. Both med's have been sent.     KP

## 2014-01-20 NOTE — Telephone Encounter (Signed)
No Show

## 2014-01-24 NOTE — Telephone Encounter (Signed)
Signed paperwork faxed back 11/27/2013. JG//CMA

## 2014-01-29 ENCOUNTER — Ambulatory Visit (INDEPENDENT_AMBULATORY_CARE_PROVIDER_SITE_OTHER): Payer: Medicare HMO | Admitting: Physician Assistant

## 2014-01-29 ENCOUNTER — Encounter: Payer: Self-pay | Admitting: Physician Assistant

## 2014-01-29 VITALS — BP 140/102 | HR 78 | Temp 98.9°F | Resp 20 | Ht 63.0 in | Wt 292.0 lb

## 2014-01-29 DIAGNOSIS — J019 Acute sinusitis, unspecified: Secondary | ICD-10-CM | POA: Insufficient documentation

## 2014-01-29 DIAGNOSIS — R062 Wheezing: Secondary | ICD-10-CM

## 2014-01-29 MED ORDER — AZITHROMYCIN 250 MG PO TABS: ORAL_TABLET | ORAL | Status: DC

## 2014-01-29 MED ORDER — ALBUTEROL SULFATE HFA 108 (90 BASE) MCG/ACT IN AERS: 2.0000 | INHALATION_SPRAY | Freq: Four times a day (QID) | RESPIRATORY_TRACT | Status: DC | PRN

## 2014-01-29 NOTE — Progress Notes (Signed)
Pre visit review using our clinic review tool, if applicable. No additional management support is needed unless otherwise documented below in the visit note/SLS  

## 2014-01-29 NOTE — Patient Instructions (Addendum)
Please take antibiotic as directed.  Increase fluid intake.  Use Saline nasal spray. Continue Flonase.  Take a daily multivitamin. Use Plain Mucinex.  Use Albuterol inhaler as directed, if you have wheezing. Place a humidifier in the bedroom.  Please call or return clinic if symptoms are not improving.  Sinusitis Sinusitis is redness, soreness, and swelling (inflammation) of the paranasal sinuses. Paranasal sinuses are air pockets within the bones of your face (beneath the eyes, the middle of the forehead, or above the eyes). In healthy paranasal sinuses, mucus is able to drain out, and air is able to circulate through them by way of your nose. However, when your paranasal sinuses are inflamed, mucus and air can become trapped. This can allow bacteria and other germs to grow and cause infection. Sinusitis can develop quickly and last only a short time (acute) or continue over a long period (chronic). Sinusitis that lasts for more than 12 weeks is considered chronic.  CAUSES  Causes of sinusitis include:  Allergies.  Structural abnormalities, such as displacement of the cartilage that separates your nostrils (deviated septum), which can decrease the air flow through your nose and sinuses and affect sinus drainage.  Functional abnormalities, such as when the small hairs (cilia) that line your sinuses and help remove mucus do not work properly or are not present. SYMPTOMS  Symptoms of acute and chronic sinusitis are the same. The primary symptoms are pain and pressure around the affected sinuses. Other symptoms include:  Upper toothache.  Earache.  Headache.  Bad breath.  Decreased sense of smell and taste.  A cough, which worsens when you are lying flat.  Fatigue.  Fever.  Thick drainage from your nose, which often is green and may contain pus (purulent).  Swelling and warmth over the affected sinuses. DIAGNOSIS  Your caregiver will perform a physical exam. During the exam, your  caregiver may:  Look in your nose for signs of abnormal growths in your nostrils (nasal polyps).  Tap over the affected sinus to check for signs of infection.  View the inside of your sinuses (endoscopy) with a special imaging device with a light attached (endoscope), which is inserted into your sinuses. If your caregiver suspects that you have chronic sinusitis, one or more of the following tests may be recommended:  Allergy tests.  Nasal culture A sample of mucus is taken from your nose and sent to a lab and screened for bacteria.  Nasal cytology A sample of mucus is taken from your nose and examined by your caregiver to determine if your sinusitis is related to an allergy. TREATMENT  Most cases of acute sinusitis are related to a viral infection and will resolve on their own within 10 days. Sometimes medicines are prescribed to help relieve symptoms (pain medicine, decongestants, nasal steroid sprays, or saline sprays).  However, for sinusitis related to a bacterial infection, your caregiver will prescribe antibiotic medicines. These are medicines that will help kill the bacteria causing the infection.  Rarely, sinusitis is caused by a fungal infection. In theses cases, your caregiver will prescribe antifungal medicine. For some cases of chronic sinusitis, surgery is needed. Generally, these are cases in which sinusitis recurs more than 3 times per year, despite other treatments. HOME CARE INSTRUCTIONS   Drink plenty of water. Water helps thin the mucus so your sinuses can drain more easily.  Use a humidifier.  Inhale steam 3 to 4 times a day (for example, sit in the bathroom with the shower running).  Apply a warm, moist washcloth to your face 3 to 4 times a day, or as directed by your caregiver.  Use saline nasal sprays to help moisten and clean your sinuses.  Take over-the-counter or prescription medicines for pain, discomfort, or fever only as directed by your caregiver. SEEK  IMMEDIATE MEDICAL CARE IF:  You have increasing pain or severe headaches.  You have nausea, vomiting, or drowsiness.  You have swelling around your face.  You have vision problems.  You have a stiff neck.  You have difficulty breathing. MAKE SURE YOU:   Understand these instructions.  Will watch your condition.  Will get help right away if you are not doing well or get worse. Document Released: 08/29/2005 Document Revised: 11/21/2011 Document Reviewed: 09/13/2011 [facility] Patient Information 2014 [place], [place].

## 2014-01-29 NOTE — Progress Notes (Signed)
Patient presents to clinic today c/o  1 week of sinus pressure, sinus pain, ear fullness, post-nasal drip and fatigue.  Patient denies fever, chills or aches.  Patient is on chronic home O2 at 2 liters.  Denies SOB but notes some mild wheezing.  Denies pleuritic chest pain, recent travel or sick contact.  Past Medical History  Diagnosis Date  . Hypertension   . Emphysema/COPD   . Depression   . GERD (gastroesophageal reflux disease)   . Urine incontinence     Current Outpatient Prescriptions on File Prior to Visit  Medication Sig Dispense Refill  . ALPRAZolam (XANAX) 0.5 MG tablet Take 1 tablet (0.5 mg total) by mouth 2 (two) times daily as needed for anxiety.  60 tablet  0  . losartan-hydrochlorothiazide (HYZAAR) 100-25 MG per tablet Take 1 tablet by mouth daily.  90 tablet  3  . metoprolol succinate (TOPROL-XL) 50 MG 24 hr tablet Take 2 tablets (100 mg total) by mouth every morning. Take with or immediately following a meal.  60 tablet  5  . Potassium Chloride ER 20 MEQ TBCR Take 20 mEq by mouth daily.  30 tablet  5  . zolpidem (AMBIEN) 10 MG tablet Take 10 mg by mouth at bedtime as needed for sleep.       No current facility-administered medications on file prior to visit.    No Known Allergies  Family History  Problem Relation Age of Onset  . Heart disease Father     MVP and Pics Valve  . Hypertension Father   . Depression Father     Institutionalized x's 2 years  . Bipolar disorder Father   . Heart disease Paternal Grandmother   . Heart disease Paternal Aunt   . Heart disease Paternal Uncle   . Schizophrenia Paternal Aunt   . Hypertension Sister   . Diabetes Sister   . Hyperlipidemia Sister   . Heart disease Sister 24    MI  . Heart disease Brother   . Hypertension Brother     History   Social History  . Marital Status: Single    Spouse Name: N/A    Number of Children: N/A  . Years of Education: N/A   Occupational History  . disabled- Occupational hygienist Med     Social History Main Topics  . Smoking status: Former Smoker -- 1.00 packs/day for 40 years    Types: Cigarettes    Quit date: 10/16/2011  . Smokeless tobacco: Never Used  . Alcohol Use: Yes     Comment: Occ-- Wine  . Drug Use: No  . Sexual Activity: None   Other Topics Concern  . None   Social History Narrative  . None   Review of Systems - See HPI.  All other ROS are negative.  BP 140/102  Pulse 78  Temp(Src) 98.9 F (37.2 C) (Oral)  Resp 20  Ht '5\' 3"'  (1.6 m)  Wt 292 lb (132.45 kg)  BMI 51.74 kg/m2  SpO2 93%  Physical Exam  Vitals reviewed. Constitutional: She is oriented to person, place, and time and well-developed, well-nourished, and in no distress.  HENT:  Head: Normocephalic and atraumatic.  Right Ear: External ear normal.  Left Ear: External ear normal.  Nose: Nose normal.  Mouth/Throat: Oropharynx is clear and moist. No oropharyngeal exudate.  TM within normal limits bilaterally.  + TTP of sinuses noted on examination.  Eyes: Conjunctivae are normal. Pupils are equal, round, and reactive to light.  Neck: Neck supple. No thyromegaly  present.  Cardiovascular: Normal rate, regular rhythm, normal heart sounds and intact distal pulses.   Pulmonary/Chest: Effort normal. No respiratory distress. She has no rales. She exhibits no tenderness.  Faint wheeze at lung bases bilaterally.  Lymphadenopathy:    She has no cervical adenopathy.  Neurological: She is alert and oriented to person, place, and time.  Skin: Skin is warm and dry.  Psychiatric: Affect normal.    Recent Results (from the past 2160 hour(s))  CBC WITH DIFFERENTIAL     Status: Abnormal   Collection Time    11/06/13 12:55 PM      Result Value Ref Range   WBC 6.0  4.0 - 10.5 K/uL   RBC 4.29  3.87 - 5.11 MIL/uL   Hemoglobin 14.9  12.0 - 15.0 g/dL   HCT 42.2  36.0 - 46.0 %   MCV 98.4  78.0 - 100.0 fL   MCH 34.7 (*) 26.0 - 34.0 pg   MCHC 35.3  30.0 - 36.0 g/dL   RDW 13.2  11.5 - 15.5 %    Platelets 225  150 - 400 K/uL   Neutrophils Relative % 47  43 - 77 %   Neutro Abs 2.8  1.7 - 7.7 K/uL   Lymphocytes Relative 44  12 - 46 %   Lymphs Abs 2.6  0.7 - 4.0 K/uL   Monocytes Relative 8  3 - 12 %   Monocytes Absolute 0.5  0.1 - 1.0 K/uL   Eosinophils Relative 2  0 - 5 %   Eosinophils Absolute 0.1  0.0 - 0.7 K/uL   Basophils Relative 0  0 - 1 %   Basophils Absolute 0.0  0.0 - 0.1 K/uL  COMPREHENSIVE METABOLIC PANEL     Status: Abnormal   Collection Time    11/06/13 12:55 PM      Result Value Ref Range   Sodium 139  137 - 147 mEq/L   Potassium 3.9  3.7 - 5.3 mEq/L   Chloride 100  96 - 112 mEq/L   CO2 27  19 - 32 mEq/L   Glucose, Bld 116 (*) 70 - 99 mg/dL   BUN 9  6 - 23 mg/dL   Creatinine, Ser 0.94  0.50 - 1.10 mg/dL   Calcium 9.5  8.4 - 10.5 mg/dL   Total Protein 8.2  6.0 - 8.3 g/dL   Albumin 3.1 (*) 3.5 - 5.2 g/dL   AST 67 (*) 0 - 37 U/L   ALT 33  0 - 35 U/L   Alkaline Phosphatase 101  39 - 117 U/L   Total Bilirubin 0.6  0.3 - 1.2 mg/dL   GFR calc non Af Amer 64 (*) >90 mL/min   GFR calc Af Amer 74 (*) >90 mL/min   Comment: (NOTE)     The eGFR has been calculated using the CKD EPI equation.     This calculation has not been validated in all clinical situations.     eGFR's persistently <90 mL/min signify possible Chronic Kidney     Disease.  Randolm Idol, ED     Status: None   Collection Time    11/06/13  1:04 PM      Result Value Ref Range   Troponin i, poc 0.00  0.00 - 0.08 ng/mL   Comment 3            Comment: Due to the release kinetics of cTnI,     a negative result within the first hours  of the onset of symptoms does not rule out     myocardial infarction with certainty.     If myocardial infarction is still suspected,     repeat the test at appropriate intervals.  LIPASE, BLOOD     Status: None   Collection Time    11/06/13  3:09 PM      Result Value Ref Range   Lipase 40  11 - 59 U/L  CBC WITH DIFFERENTIAL     Status: Abnormal   Collection  Time    11/11/13  1:53 PM      Result Value Ref Range   WBC 5.1  4.5 - 10.5 K/uL   RBC 4.36  3.87 - 5.11 Mil/uL   Hemoglobin 14.6  12.0 - 15.0 g/dL   HCT 43.7  36.0 - 46.0 %   MCV 100.2 (*) 78.0 - 100.0 fl   MCHC 33.4  30.0 - 36.0 g/dL   RDW 13.4  11.5 - 14.6 %   Platelets 257.0  150.0 - 400.0 K/uL   Neutrophils Relative % 39.1 (*) 43.0 - 77.0 %   Lymphocytes Relative 50.1 (*) 12.0 - 46.0 %   Monocytes Relative 7.1  3.0 - 12.0 %   Eosinophils Relative 3.3  0.0 - 5.0 %   Basophils Relative 0.4  0.0 - 3.0 %   Neutro Abs 2.0  1.4 - 7.7 K/uL   Lymphs Abs 2.6  0.7 - 4.0 K/uL   Monocytes Absolute 0.4  0.1 - 1.0 K/uL   Eosinophils Absolute 0.2  0.0 - 0.7 K/uL   Basophils Absolute 0.0  0.0 - 0.1 K/uL  COMPREHENSIVE METABOLIC PANEL     Status: Abnormal   Collection Time    11/11/13  1:53 PM      Result Value Ref Range   Sodium 138  135 - 145 mEq/L   Potassium 3.6  3.5 - 5.1 mEq/L   Chloride 108  96 - 112 mEq/L   CO2 26  19 - 32 mEq/L   Glucose, Bld 84  70 - 99 mg/dL   BUN 7  6 - 23 mg/dL   Creatinine, Ser 1.0  0.4 - 1.2 mg/dL   Total Bilirubin 0.8  0.3 - 1.2 mg/dL   Alkaline Phosphatase 81  39 - 117 U/L   AST 49 (*) 0 - 37 U/L   ALT 26  0 - 35 U/L   Total Protein 7.9  6.0 - 8.3 g/dL   Albumin 3.2 (*) 3.5 - 5.2 g/dL   Calcium 8.3 (*) 8.4 - 10.5 mg/dL   GFR 73.26  >60.00 mL/min  URINALYSIS, ROUTINE W REFLEX MICROSCOPIC     Status: Abnormal   Collection Time    11/11/13  1:53 PM      Result Value Ref Range   Color, Urine YELLOW  Yellow;Lt. Yellow   APPearance CLEAR  Clear   Specific Gravity, Urine 1.015  1.000-1.030   pH 7.5  5.0 - 8.0   Total Protein, Urine TRACE (*) Negative   Urine Glucose NEGATIVE  Negative   Ketones, ur NEGATIVE  Negative   Bilirubin Urine NEGATIVE  Negative   Hgb urine dipstick NEGATIVE  Negative   Urobilinogen, UA 2.0 (*) 0.0 - 1.0   Leukocytes, UA NEGATIVE  Negative   Nitrite NEGATIVE  Negative   WBC, UA 0-2/hpf  0-2/hpf   RBC / HPF none seen   0-2/hpf   Squamous Epithelial / LPF Rare(0-4/hpf)  Rare(0-4/hpf)   Renal Epithel, UA Rare(0-4/hpf) (*)  None   Bacteria, UA Rare(<10/hpf) (*) None   Yeast, UA Presence of (*) None  POCT URINALYSIS DIPSTICK     Status: None   Collection Time    11/11/13  2:06 PM      Result Value Ref Range   Color, UA orange     Comment: dark   Clarity, UA bubbly     Glucose, UA neg     Bilirubin, UA small     Ketones, UA trace     Spec Grav, UA 1.010     Blood, UA trace     pH, UA 7.5     Protein, UA trace     Urobilinogen, UA 4.0     Nitrite, UA neg     Leukocytes, UA Trace    PULMONARY FUNCTION TEST     Status: None   Collection Time    11/29/13 10:48 AM      Result Value Ref Range   FVC-Pre 2.30     FVC-%Pred-Pre 95     FVC-Post 2.39     FVC-%Pred-Post 99     FVC-%Change-Post 3     FEV1-Pre 1.67     FEV1-%Pred-Pre 88     FEV1-Post 1.79     FEV1-%Pred-Post 95     FEV1-%Change-Post 7     FEV6-Pre 2.30     FEV6-%Pred-Pre 99     FEV6-Post 2.39     FEV6-%Pred-Post 103     FEV6-%Change-Post 3     Pre FEV1/FVC ratio 72     FEV1FVC-%Pred-Pre 91     Post FEV1/FVC ratio 75     FEV1FVC-%Change-Post 3     Pre FEV6/FVC Ratio 100     FEV6FVC-%Pred-Pre 103     Post FEV6/FVC ratio 100     FEV6FVC-%Pred-Post 104     FEV6FVC-%Change-Post 0     FEF 25-75 Pre 1.11     FEF2575-%Pred-Pre 58     FEF 25-75 Post 1.55     FEF2575-%Pred-Post 82     FEF2575-%Change-Post 40     RV 1.83     RV % pred 95     TLC 4.14     TLC % pred 87     DLCO unc 13.47     DLCO unc % pred 62     DL/VA 3.41     DL/VA % pred 75    BASIC METABOLIC PANEL     Status: Abnormal   Collection Time    01/06/14  2:19 PM      Result Value Ref Range   Sodium 138  135 - 145 mEq/L   Potassium 3.2 (*) 3.5 - 5.1 mEq/L   Chloride 99  96 - 112 mEq/L   CO2 33 (*) 19 - 32 mEq/L   Glucose, Bld 179 (*) 70 - 99 mg/dL   BUN 8  6 - 23 mg/dL   Creatinine, Ser 0.9  0.4 - 1.2 mg/dL   Calcium 9.0  8.4 - 10.5 mg/dL   GFR 81.74   >60.00 mL/min   Assessment/Plan: Acute sinusitis Rx Azithromycin.  Increase fluids.  Continue Flonase and saline nasal spray. Rx Albuterol for mild wheeze.  Humidifier in bedroom. Plain Mucinex.  Continue home oxygen.  Call or return to clinic if symptoms are not improving within 48 hours.

## 2014-01-29 NOTE — Assessment & Plan Note (Signed)
Rx Azithromycin.  Increase fluids.  Continue Flonase and saline nasal spray. Rx Albuterol for mild wheeze.  Humidifier in bedroom. Plain Mucinex.  Continue home oxygen.  Call or return to clinic if symptoms are not improving within 48 hours.

## 2014-01-31 ENCOUNTER — Ambulatory Visit: Payer: Medicare HMO | Admitting: Family Medicine

## 2014-07-17 ENCOUNTER — Telehealth: Payer: Self-pay | Admitting: Family Medicine

## 2014-07-17 MED ORDER — METOPROLOL SUCCINATE ER 50 MG PO TB24: 100.0000 mg | ORAL_TABLET | Freq: Every morning | ORAL | Status: DC

## 2014-07-17 NOTE — Telephone Encounter (Signed)
Caller name:Boettger, Letta Median Relation to PO:[id number] Call back [phone] Pharmacy:[facility]  Reason for call: pt is needing new rx for metoprolol succinate (TOPROL-XL) 50 MG 24 hr tablet.

## 2014-07-17 NOTE — Telephone Encounter (Signed)
Pt states she provided the wrong cell # should be [phone]

## 2014-07-29 ENCOUNTER — Encounter (HOSPITAL_COMMUNITY): Payer: Self-pay

## 2014-07-29 ENCOUNTER — Observation Stay (HOSPITAL_COMMUNITY)
Admission: EM | Admit: 2014-07-29 | Discharge: 2014-07-30 | Disposition: A | Discharge status: Home / Self Care | Payer: Commercial Managed Care - HMO | Attending: Internal Medicine | Admitting: Internal Medicine

## 2014-07-29 ENCOUNTER — Emergency Department (HOSPITAL_COMMUNITY): Payer: Commercial Managed Care - HMO

## 2014-07-29 DIAGNOSIS — G4700 Insomnia, unspecified: Secondary | ICD-10-CM | POA: Diagnosis present

## 2014-07-29 DIAGNOSIS — F329 Major depressive disorder, single episode, unspecified: Secondary | ICD-10-CM | POA: Diagnosis not present

## 2014-07-29 DIAGNOSIS — J9610 Chronic respiratory failure, unspecified whether with hypoxia or hypercapnia: Secondary | ICD-10-CM | POA: Diagnosis not present

## 2014-07-29 DIAGNOSIS — K219 Gastro-esophageal reflux disease without esophagitis: Secondary | ICD-10-CM | POA: Insufficient documentation

## 2014-07-29 DIAGNOSIS — J9611 Chronic respiratory failure with hypoxia: Secondary | ICD-10-CM

## 2014-07-29 DIAGNOSIS — Z79899 Other long term (current) drug therapy: Secondary | ICD-10-CM | POA: Diagnosis not present

## 2014-07-29 DIAGNOSIS — J439 Emphysema, unspecified: Secondary | ICD-10-CM | POA: Insufficient documentation

## 2014-07-29 DIAGNOSIS — R471 Dysarthria and anarthria: Secondary | ICD-10-CM | POA: Diagnosis not present

## 2014-07-29 DIAGNOSIS — E669 Obesity, unspecified: Secondary | ICD-10-CM | POA: Diagnosis present

## 2014-07-29 DIAGNOSIS — Z6830 Body mass index (BMI) 30.0-30.9, adult: Secondary | ICD-10-CM | POA: Insufficient documentation

## 2014-07-29 DIAGNOSIS — R32 Unspecified urinary incontinence: Secondary | ICD-10-CM | POA: Insufficient documentation

## 2014-07-29 DIAGNOSIS — F419 Anxiety disorder, unspecified: Secondary | ICD-10-CM | POA: Diagnosis not present

## 2014-07-29 DIAGNOSIS — G459 Transient cerebral ischemic attack, unspecified: Principal | ICD-10-CM | POA: Diagnosis present

## 2014-07-29 DIAGNOSIS — I10 Essential (primary) hypertension: Secondary | ICD-10-CM | POA: Diagnosis present

## 2014-07-29 DIAGNOSIS — Z9981 Dependence on supplemental oxygen: Secondary | ICD-10-CM | POA: Diagnosis not present

## 2014-07-29 DIAGNOSIS — R531 Weakness: Secondary | ICD-10-CM | POA: Diagnosis present

## 2014-07-29 DIAGNOSIS — Z87891 Personal history of nicotine dependence: Secondary | ICD-10-CM | POA: Insufficient documentation

## 2014-07-29 DIAGNOSIS — F418 Other specified anxiety disorders: Secondary | ICD-10-CM | POA: Diagnosis present

## 2014-07-29 LAB — RAPID URINE DRUG SCREEN, HOSP PERFORMED
Amphetamines: NOT DETECTED
BENZODIAZEPINES: NOT DETECTED
Barbiturates: NOT DETECTED
Cocaine: NOT DETECTED
Opiates: NOT DETECTED
Tetrahydrocannabinol: NOT DETECTED

## 2014-07-29 LAB — CBG MONITORING, ED
GLUCOSE-CAPILLARY: 140 mg/dL — AB (ref 70–99)
Glucose-Capillary: 58 mg/dL — ABNORMAL LOW (ref 70–99)

## 2014-07-29 LAB — DIFFERENTIAL
BASOS PCT: 0 % (ref 0–1)
Basophils Absolute: 0 10*3/uL (ref 0.0–0.1)
EOS ABS: 0.2 10*3/uL (ref 0.0–0.7)
Eosinophils Relative: 3 % (ref 0–5)
Lymphocytes Relative: 52 % — ABNORMAL HIGH (ref 12–46)
Lymphs Abs: 3.3 10*3/uL (ref 0.7–4.0)
Monocytes Absolute: 0.5 10*3/uL (ref 0.1–1.0)
Monocytes Relative: 8 % (ref 3–12)
NEUTROS PCT: 37 % — AB (ref 43–77)
Neutro Abs: 2.4 10*3/uL (ref 1.7–7.7)

## 2014-07-29 LAB — COMPREHENSIVE METABOLIC PANEL
ALBUMIN: 3 g/dL — AB (ref 3.5–5.2)
ALK PHOS: 115 U/L (ref 39–117)
ALT: 23 U/L (ref 0–35)
ANION GAP: 12 (ref 5–15)
AST: 59 U/L — ABNORMAL HIGH (ref 0–37)
BUN: 10 mg/dL (ref 6–23)
CO2: 26 mEq/L (ref 19–32)
Calcium: 9.3 mg/dL (ref 8.4–10.5)
Chloride: 98 mEq/L (ref 96–112)
Creatinine, Ser: 1.07 mg/dL (ref 0.50–1.10)
GFR calc non Af Amer: 54 mL/min — ABNORMAL LOW (ref >90)
GFR, EST AFRICAN AMERICAN: 63 mL/min — AB (ref >90)
Glucose, Bld: 81 mg/dL (ref 70–99)
POTASSIUM: 3.7 meq/L (ref 3.7–5.3)
SODIUM: 136 meq/L — AB (ref 137–147)
Total Bilirubin: 0.7 mg/dL (ref 0.3–1.2)
Total Protein: 8.6 g/dL — ABNORMAL HIGH (ref 6.0–8.3)

## 2014-07-29 LAB — PROTIME-INR
INR: 1.13 (ref 0.00–1.49)
PROTHROMBIN TIME: 14.6 s (ref 11.6–15.2)

## 2014-07-29 LAB — CBC
HCT: 43.8 % (ref 36.0–46.0)
Hemoglobin: 14.7 g/dL (ref 12.0–15.0)
MCH: 33 pg (ref 26.0–34.0)
MCHC: 33.6 g/dL (ref 30.0–36.0)
MCV: 98.2 fL (ref 78.0–100.0)
PLATELETS: 160 10*3/uL (ref 150–400)
RBC: 4.46 MIL/uL (ref 3.87–5.11)
RDW: 13 % (ref 11.5–15.5)
WBC: 6.4 10*3/uL (ref 4.0–10.5)

## 2014-07-29 LAB — I-STAT CHEM 8, ED
BUN: 10 mg/dL (ref 6–23)
CHLORIDE: 98 meq/L (ref 96–112)
Calcium, Ion: 1.15 mmol/L (ref 1.13–1.30)
Creatinine, Ser: 1.1 mg/dL (ref 0.50–1.10)
GLUCOSE: 84 mg/dL (ref 70–99)
HEMATOCRIT: 49 % — AB (ref 36.0–46.0)
Hemoglobin: 16.7 g/dL — ABNORMAL HIGH (ref 12.0–15.0)
POTASSIUM: 3.4 meq/L — AB (ref 3.7–5.3)
SODIUM: 140 meq/L (ref 137–147)
TCO2: 29 mmol/L (ref 0–100)

## 2014-07-29 LAB — URINALYSIS, ROUTINE W REFLEX MICROSCOPIC
BILIRUBIN URINE: NEGATIVE
GLUCOSE, UA: 250 mg/dL — AB
Hgb urine dipstick: NEGATIVE
Ketones, ur: NEGATIVE mg/dL
Leukocytes, UA: NEGATIVE
Nitrite: NEGATIVE
PH: 7.5 (ref 5.0–8.0)
PROTEIN: NEGATIVE mg/dL
Specific Gravity, Urine: <1.005 — ABNORMAL LOW (ref 1.005–1.030)
Urobilinogen, UA: 0.2 mg/dL (ref 0.0–1.0)

## 2014-07-29 LAB — APTT: aPTT: 33 seconds (ref 24–37)

## 2014-07-29 LAB — I-STAT TROPONIN, ED: TROPONIN I, POC: 0 ng/mL (ref 0.00–0.08)

## 2014-07-29 LAB — ETHANOL

## 2014-07-29 MED ORDER — DEXTROSE 50 % IV SOLN
INTRAVENOUS | Status: AC
  Filled 2014-07-29: qty 50

## 2014-07-29 MED ORDER — DEXTROSE 50 % IV SOLN
1.0000 | Freq: Once | INTRAVENOUS | Status: AC
  Administered 2014-07-29: 50 mL via INTRAVENOUS

## 2014-07-29 NOTE — H&P (Signed)
[facility] History and Physical  Patient: Sue [name]  [id number]  DOB: 01/15/1951  DOS: the patient was seen and examined on 07/29/2014 PCP: Garnet Koyanagi, DO  Chief Complaint: speech difficulty  HPI: [name] is a 63 y.o. female with Past medical history of hypertension, COPD, depression, GERD, insomnia. The patient is presenting with complaints of speech difficulty that started this afternoon.she mentions that at around 8 PM she suddenly started having a sensation of not feeling well associated with tachycardia, nausea, diaphoresis, followed by speech difficulty as well as left hand and leg weakness. With his symptoms not improving the family decided to call EMS and the patient was brought in for further workup. She was seen as a "stroke but since her symptoms were improving she was not fact a candidate for TPA. She mentions she is compliant with all his medication and denies any recent change in her medication. She denies any similar symptoms in the past. No fall no trauma noted injury no recent travel no burning urination no cough no chest pain reported at the time of my evaluation. She denies any similar episodes of tachycardia in the past  The patient is coming from home. And at her baseline independent for most of her ADL.  Review of Systems: as mentioned in the history of present illness.  A Comprehensive review of the other systems is negative.  Past Medical History  Diagnosis Date  . Hypertension   . Emphysema/COPD   . Depression   . GERD (gastroesophageal reflux disease)   . Urine incontinence    Past Surgical History  Procedure Laterality Date  . Abdominal hysterectomy    . Cesarean section     Social History:  reports that she quit smoking about 2 years ago. Her smoking use included Cigarettes. She has a 40 pack-year smoking history. She has never used smokeless tobacco. She reports that she drinks alcohol. She reports that she does not use  illicit drugs.  No Known Allergies  Family History  Problem Relation Age of Onset  . Heart disease Father     MVP and Pics Valve  . Hypertension Father   . Depression Father     Institutionalized x's 2 years  . Bipolar disorder Father   . Heart disease Paternal Grandmother   . Heart disease Paternal Aunt   . Heart disease Paternal Uncle   . Schizophrenia Paternal Aunt   . Hypertension Sister   . Diabetes Sister   . Hyperlipidemia Sister   . Heart disease Sister 66    MI  . Heart disease Brother   . Hypertension Brother     Prior to Admission medications   Medication Sig Start Date End Date Taking? Authorizing Provider  albuterol (PROVENTIL HFA;VENTOLIN HFA) 108 (90 BASE) MCG/ACT inhaler Inhale 2 puffs into the lungs every 6 (six) hours as needed for wheezing or shortness of breath. 01/29/14  Yes Brunetta Jeans, PA-C  doxylamine, Sleep, (UNISOM) 25 MG tablet Take 25 mg by mouth at bedtime.   Yes Historical Provider, MD  LORazepam (ATIVAN) 2 MG tablet Take 2 mg by mouth at bedtime.   Yes Historical Provider, MD  losartan-hydrochlorothiazide (HYZAAR) 100-25 MG per tablet Take 1 tablet by mouth daily. 01/06/14  Yes Alferd Apa Lowne, DO  metoprolol succinate (TOPROL-XL) 50 MG 24 hr tablet Take 2 tablets (100 mg total) by mouth every morning. Take with or immediately following a meal. 07/17/14  Yes Yvonne R Lowne, DO  OXYGEN Inhale 2 L  into the lungs continuous.   Yes Historical Provider, MD  zolpidem (AMBIEN) 10 MG tablet Take 10 mg by mouth at bedtime.    Yes Historical Provider, MD  ALPRAZolam Duanne Moron) 0.5 MG tablet Take 1 tablet (0.5 mg total) by mouth 2 (two) times daily as needed for anxiety. Patient not taking: Reported on 07/29/2014 12/10/13   Rosalita Chessman, DO  azithromycin (ZITHROMAX Z-PAK) 250 MG tablet Take 2 tablets (500 mg) on  Day 1,  followed by 1 tablet (250 mg) once daily on Days 2 through 5. Patient not taking: Reported on 07/29/2014 01/29/14   Brunetta Jeans, PA-C   Potassium Chloride ER 20 MEQ TBCR Take 20 mEq by mouth daily. Patient not taking: Reported on 07/29/2014 01/16/14   Rosalita Chessman, DO    Physical Exam: Filed Vitals:   07/29/14 2230 07/29/14 2245 07/29/14 2300 07/29/14 2315  BP:  128/111 146/93 135/85  Pulse: 73 75 75 71  Temp:      TempSrc:      Resp: 12 24 13 18   Height:      Weight:      SpO2: 98% 99% 98% 100%    General: Alert, Awake and Oriented to Time, Place and Person. Appear in mild distress Eyes: PERRL ENT: Oral Mucosa clear moist. Neck: no JVD Cardiovascular: S1 and S2 Present, no Murmur, Peripheral Pulses Present Respiratory: Bilateral Air entry equal and Decreased, Clear to Auscultation, noCrackles, no wheezes Abdomen: Bowel Sound presnt, Soft and non tender Skin: no Rash Extremities: trace Pedal edema, non calf tenderness Neurologic: Grossly no focal neuro deficit.  Labs on Admission:  CBC:  Recent Labs Lab 07/29/14 2203 07/29/14 2219  WBC 6.4  --   NEUTROABS 2.4  --   HGB 14.7 16.7*  HCT 43.8 49.0*  MCV 98.2  --   PLT 160  --     CMP     Component Value Date/Time   NA 140 07/29/2014 2219   K 3.4* 07/29/2014 2219   CL 98 07/29/2014 2219   CO2 26 07/29/2014 2203   GLUCOSE 84 07/29/2014 2219   BUN 10 07/29/2014 2219   CREATININE 1.10 07/29/2014 2219   CALCIUM 9.3 07/29/2014 2203   PROT 8.6* 07/29/2014 2203   ALBUMIN 3.0* 07/29/2014 2203   AST 59* 07/29/2014 2203   ALT 23 07/29/2014 2203   ALKPHOS 115 07/29/2014 2203   BILITOT 0.7 07/29/2014 2203   GFRNONAA 54* 07/29/2014 2203   GFRAA 63* 07/29/2014 2203    No results for input(s): LIPASE, AMYLASE in the last 168 hours. No results for input(s): AMMONIA in the last 168 hours.  No results for input(s): CKTOTAL, CKMB, CKMBINDEX, TROPONINI in the last 168 hours. BNP (last 3 results)  Recent Labs  10/09/13 1135 10/11/13 0548  PROBNP 67.2 57.0    Radiological Exams on Admission: Ct Head Wo Contrast  07/29/2014   CLINICAL DATA:   Left-sided weakness with slurred speech.  EXAM: CT HEAD WITHOUT CONTRAST  TECHNIQUE: Contiguous axial images were obtained from the base of the skull through the vertex without intravenous contrast.  COMPARISON:  None.  FINDINGS: Ventricles and sulci appear symmetrical. No mass effect or midline shift. No abnormal extra-axial fluid collections. Gray-white matter junctions are distinct. Basal cisterns are not effaced. No evidence of acute intracranial hemorrhage. No depressed skull fractures. Visualized paranasal sinuses and mastoid air cells are not opacified.  IMPRESSION: Normal   Electronically Signed   By: Lucienne Capers M.D.   On: 07/29/2014  22:44   EKG: Independently reviewed. normal sinus rhythm, nonspecific ST and T waves changes.  Assessment/Plan Principal Problem:   TIA (transient ischemic attack) Active Problems:   HTN (hypertension)   Obesity (BMI 30-39.9)   Depression with anxiety   Insomnia   Chronic respiratory failure   1. TIA (transient ischemic attack) The patient is presenting with complaints of speech difficulty and left-sided weakness. Neurology has evaluated the patient and feels that the patient is not a candidate for TPA and recommend further workup. At present she will be admitted in the hospital on telemetry floor and will continue to monitor serial neuro checks and obtain MRI of the brain as well as echocardiogram and carotid Doppler. Patient is not on any aspirin and therefore neurology recommends to start her on 325 mg aspirin. Further workup will also be obtained with lab works.  2.COPD, chronic respiratory failure on 2 L of oxygen. At present appears stable and continue close monitoring.  3.insomnia. Patient will remain nothing by mouth until evaluated by speech therapy in the morning due to her presentation is clear. At present she is on very high doses of oral sedatives at home who helps with her sleep, allergies minimal doses of Ativan 1 to help her  sleep.  4.hypertension. Currently holding her medications for permissive hypertension and also in view of her nothing by mouth status.  Advance goals of care discussion: full code   Consults: neurology  DVT Prophylaxis: subcutaneous Heparin Nutrition: nothing by mouth continue with her by speech therapy in the morning  Family Communication: family was present at bedside, opportunity was given to ask question and all questions were answered satisfactorily at the time of interview. Disposition: Admitted to observation in telemetry unit.  Author: Berle Mull, MD [facility] Pager: [phone] 07/29/2014, 11:57 PM    If 7PM-7AM, please contact night-coverage [email or web address] Password [email or web address]

## 2014-07-29 NOTE — ED Provider Notes (Signed)
CSN: [id number]     Arrival date & time 07/29/14  2130 History   First MD Initiated Contact with Patient 07/29/14 2134     Chief Complaint  Patient presents with  . Weakness     (Consider location/radiation/quality/duration/timing/severity/associated sxs/prior Treatment) Patient is a 63 y.o. female presenting with weakness. The history is provided by the patient. No language interpreter was used.  Weakness This is a new problem. The current episode started today (8 pm). The problem occurs constantly. The problem has been gradually improving. Associated symptoms include weakness. Pertinent negatives include no abdominal pain, chest pain, diaphoresis, fever, headaches, nausea, neck pain, numbness, vertigo or visual change. Associated symptoms comments: dysarthria. Nothing aggravates the symptoms. She has tried nothing for the symptoms.    Past Medical History  Diagnosis Date  . Hypertension   . Emphysema/COPD   . Depression   . GERD (gastroesophageal reflux disease)   . Urine incontinence    Past Surgical History  Procedure Laterality Date  . Abdominal hysterectomy    . Cesarean section     Family History  Problem Relation Age of Onset  . Heart disease Father     MVP and Pics Valve  . Hypertension Father   . Depression Father     Institutionalized x's 2 years  . Bipolar disorder Father   . Heart disease Paternal Grandmother   . Heart disease Paternal Aunt   . Heart disease Paternal Uncle   . Schizophrenia Paternal Aunt   . Hypertension Sister   . Diabetes Sister   . Hyperlipidemia Sister   . Heart disease Sister 29    MI  . Heart disease Brother   . Hypertension Brother    History  Substance Use Topics  . Smoking status: Former Smoker -- 1.00 packs/day for 40 years    Types: Cigarettes    Quit date: 10/16/2011  . Smokeless tobacco: Never Used  . Alcohol Use: Yes     Comment: Occ-- Wine   OB History    No data available     Review of Systems   Constitutional: Negative for fever and diaphoresis.  HENT: Negative for trouble swallowing and voice change.   Eyes: Negative for visual disturbance.  Respiratory: Negative for chest tightness and shortness of breath.   Cardiovascular: Positive for palpitations. Negative for chest pain and leg swelling.  Gastrointestinal: Negative for nausea and abdominal pain.  Musculoskeletal: Negative for neck pain.  Neurological: Positive for speech difficulty and weakness. Negative for dizziness, vertigo, syncope, facial asymmetry, numbness and headaches.  Hematological: Does not bruise/bleed easily.  All other systems reviewed and are negative.     Allergies  Review of patient's allergies indicates no known allergies.  Home Medications   Prior to Admission medications   Medication Sig Start Date End Date Taking? Authorizing Provider  albuterol (PROVENTIL HFA;VENTOLIN HFA) 108 (90 BASE) MCG/ACT inhaler Inhale 2 puffs into the lungs every 6 (six) hours as needed for wheezing or shortness of breath. 01/29/14   Brunetta Jeans, PA-C  ALPRAZolam Duanne Moron) 0.5 MG tablet Take 1 tablet (0.5 mg total) by mouth 2 (two) times daily as needed for anxiety. 12/10/13   Rosalita Chessman, DO  azithromycin (ZITHROMAX Z-PAK) 250 MG tablet Take 2 tablets (500 mg) on  Day 1,  followed by 1 tablet (250 mg) once daily on Days 2 through 5. 01/29/14   Brunetta Jeans, PA-C  losartan-hydrochlorothiazide (HYZAAR) 100-25 MG per tablet Take 1 tablet by mouth daily. 01/06/14  Rosalita Chessman, DO  metoprolol succinate (TOPROL-XL) 50 MG 24 hr tablet Take 2 tablets (100 mg total) by mouth every morning. Take with or immediately following a meal. 07/17/14   Rosalita Chessman, DO  Potassium Chloride ER 20 MEQ TBCR Take 20 mEq by mouth daily. 01/16/14   Rosalita Chessman, DO  zolpidem (AMBIEN) 10 MG tablet Take 10 mg by mouth at bedtime as needed for sleep.    Historical Provider, MD   BP 137/82 mmHg  Pulse 74  Temp(Src) 98 F (36.7 C) (Oral)   Resp 18  Ht 5\' 3"  (1.6 m)  Wt 250 lb (113.399 kg)  BMI 44.30 kg/m2  SpO2 99% Physical Exam  Constitutional: She appears well-developed.  HENT:  Head: Normocephalic.  Mouth/Throat: Oropharynx is clear and moist.  Eyes: EOM are normal. Pupils are equal, round, and reactive to light.  Neck: Carotid bruit is not present.  Cardiovascular: Normal rate, regular rhythm, normal heart sounds and intact distal pulses.   Pulmonary/Chest: Effort normal and breath sounds normal.  Abdominal: Soft. There is no tenderness.  Neurological: She is alert. She displays no tremor. No cranial nerve deficit or sensory deficit. She exhibits normal muscle tone. GCS eye subscore is 4. GCS verbal subscore is 5. GCS motor subscore is 6.  Reflex Scores:      Bicep reflexes are 2+ on the right side and 2+ on the left side.      Patellar reflexes are 2+ on the right side and 2+ on the left side. 5/5 bilateral UE, 3/5 LLE, 5/5 RLE. Ataxic gait. Stuttering speech. Normal recognition and naming.   Skin: Skin is warm.  Vitals reviewed.   ED Course  Procedures (including critical care time) Labs Review Labs Reviewed  DIFFERENTIAL - Abnormal; Notable for the following:    Neutrophils Relative % 37 (*)    Lymphocytes Relative 52 (*)    All other components within normal limits  COMPREHENSIVE METABOLIC PANEL - Abnormal; Notable for the following:    Sodium 136 (*)    Total Protein 8.6 (*)    Albumin 3.0 (*)    AST 59 (*)    GFR calc non Af Amer 54 (*)    GFR calc Af Amer 63 (*)    All other components within normal limits  URINALYSIS, ROUTINE W REFLEX MICROSCOPIC - Abnormal; Notable for the following:    Specific Gravity, Urine <1.005 (*)    Glucose, UA 250 (*)    All other components within normal limits  I-STAT CHEM 8, ED - Abnormal; Notable for the following:    Potassium 3.4 (*)    Hemoglobin 16.7 (*)    HCT 49.0 (*)    All other components within normal limits  CBG MONITORING, ED - Abnormal; Notable  for the following:    Glucose-Capillary 58 (*)    All other components within normal limits  CBG MONITORING, ED - Abnormal; Notable for the following:    Glucose-Capillary 140 (*)    All other components within normal limits  ETHANOL  PROTIME-INR  APTT  CBC  URINE RAPID DRUG SCREEN (HOSP PERFORMED)  I-STAT TROPOININ, ED  I-STAT TROPOININ, ED    Imaging Review Ct Head Wo Contrast  07/29/2014   CLINICAL DATA:  Left-sided weakness with slurred speech.  EXAM: CT HEAD WITHOUT CONTRAST  TECHNIQUE: Contiguous axial images were obtained from the base of the skull through the vertex without intravenous contrast.  COMPARISON:  None.  FINDINGS: Ventricles and sulci appear  symmetrical. No mass effect or midline shift. No abnormal extra-axial fluid collections. Gray-white matter junctions are distinct. Basal cisterns are not effaced. No evidence of acute intracranial hemorrhage. No depressed skull fractures. Visualized paranasal sinuses and mastoid air cells are not opacified.  IMPRESSION: Normal   Electronically Signed   By: Lucienne Capers M.D.   On: 07/29/2014 22:44     EKG Interpretation   Date/Time:  Tuesday July 29 2014 21:40:28 EST Ventricular Rate:  75 PR Interval:  207 QRS Duration: 102 QT Interval:  420 QTC Calculation: 469 R Axis:   59 Text Interpretation:  Sinus rhythm Borderline T abnormalities, anterior  leads No significant change since last tracing Confirmed by POLLINA  MD,  CHRISTOPHER [phone]) on 07/29/2014 10:13:03 PM      MDM   Final diagnoses:  Weakness    63 y/o female with left sided weakness and dysarthria. Onset 8 pm. Waxing/waning symptoms, reportedly speaking normally on arrival. Stuttering on my exam. Was able to walk to bed but has left leg weakness on exam. Code stroke activated. tPA not given due to minor deficits but will monitor through 12:30. CGB 58, given d50 bolus without change in exam. CT without large territory infarct. Will admit to  Hospitalist for further CVA evaluation.     Amparo Bristol, MD 07/30/14 [place], MD 07/30/14 [phone]

## 2014-07-29 NOTE — ED Notes (Signed)
Pt comes from home via [facility], left arm was feeling weak around 8 pm, reports not eating today, and only drinking strong cup of coffee. Pt AxO X 4

## 2014-07-29 NOTE — Consult Note (Signed)
Stroke Consult    Chief Complaint: slurred speech and left arm weakness HPI: Sue [name] is an 63 y.o. female with history of HTN presenting as code stroke with acute onset slurred speech and LUE weakness. Patient notes not eating all day, was in normal state of health until 8pm when family noticed she was slurring her words and started complaining of weakness in her left upper extremity. EMS called. Symptoms improved prior to arrival in ED. While in ED RN noted worsening of slurred speech. Family feels her speech is not back to normal.   Blood sugar in ED found to be 58. Given D50 in ED. Per family, patient has COPD, wears oxygen at baseline. Able to converse without difficulty. Able to ambulate short distances. No prior CVA or TIA history.   NIHSS score of 2   Date last known well: 07/29/2014 Time last known well: 2000 tPA Given:no, due to mildness of symptoms. Benefits and risks of tPA discussed with patient and family who expressed understanding and agreement of plan. She will remain on tPA watch until 1230am 07/30/2014  Past Medical History  Diagnosis Date  . Hypertension   . Emphysema/COPD   . Depression   . GERD (gastroesophageal reflux disease)   . Urine incontinence     Past Surgical History  Procedure Laterality Date  . Abdominal hysterectomy    . Cesarean section      Family History  Problem Relation Age of Onset  . Heart disease Father     MVP and Pics Valve  . Hypertension Father   . Depression Father     Institutionalized x's 2 years  . Bipolar disorder Father   . Heart disease Paternal Grandmother   . Heart disease Paternal Aunt   . Heart disease Paternal Uncle   . Schizophrenia Paternal Aunt   . Hypertension Sister   . Diabetes Sister   . Hyperlipidemia Sister   . Heart disease Sister 79    MI  . Heart disease Brother   . Hypertension Brother    Social History:  reports that she quit smoking about 2 years ago. Her smoking use included Cigarettes. She  has a 40 pack-year smoking history. She has never used smokeless tobacco. She reports that she drinks alcohol. She reports that she does not use illicit drugs.  Allergies: No Known Allergies   (Not in a hospital admission)  ROS: Out of a complete 14 system review, the patient complains of only the following symptoms, and all other reviewed systems are negative. +weakness, fatigue Imaging personally reviewed and CT head was unremarkable  Physical Examination: Blood pressure 137/82, pulse 74, temperature 98 F (36.7 C), temperature source Oral, resp. rate 18, height 5\' 3"  (1.6 m), weight 113.399 kg (250 lb), SpO2 99 %.    Neurologic Examination: Mental Status: Alert, oriented to name, date, location. Speech slow but no evidence of aphasia. Mild dysarthria.  Able to follow 3 step commands without difficulty. Cranial Nerves: II: funduscopic exam wnl bilaterally, visual fields grossly normal, pupils equal, round, reactive to light and accommodation III,IV, VI: ptosis not present, extra-ocular motions intact bilaterally V,VII: smile symmetric, facial light touch sensation normal bilaterally VIII: hearing normal bilaterally IX,X: gag reflex present XI: trapezius strength/neck flexion strength normal bilaterally XII: tongue strength normal  Motor: Right : Upper extremity    Left:     Upper extremity 5/5 deltoid       5/5 deltoid 5/5 biceps      5/5 biceps  5/5  triceps      5/5 triceps 5/5 hand grip      5/5 hand grip  Lower extremity     Lower extremity 4+/5 hip flexor      4+/5 hip flexor 4+/5 quadricep     4+/5 quadriceps  4+/5 hamstrings     4+/5 hamstrings 5/5 plantar flexion       5/5 plantar flexion 5/5 plantar extension     5/5 plantar extension Tone and bulk:normal tone throughout; no atrophy noted Sensory: decreased LT LLE Deep Tendon Reflexes: 1+ and symmetric throughout with absent AJs bilaterally Plantars: Right: downgoing   Left: downgoing Cerebellar: normal  finger-to-nose, normal rapid alternating movements, mild difficulty with HTS bilaterally  Gait: deferred to multiple leads in ED  Laboratory Studies:   Basic Metabolic Panel:  Recent Labs Lab 07/29/14 2203 07/29/14 2219  NA 136* 140  K 3.7 3.4*  CL 98 98  CO2 26  --   GLUCOSE 81 84  BUN 10 10  CREATININE 1.07 1.10  CALCIUM 9.3  --     Liver Function Tests:  Recent Labs Lab 07/29/14 2203  AST 59*  ALT 23  ALKPHOS 115  BILITOT 0.7  PROT 8.6*  ALBUMIN 3.0*   No results for input(s): LIPASE, AMYLASE in the last 168 hours. No results for input(s): AMMONIA in the last 168 hours.  CBC:  Recent Labs Lab 07/29/14 2203 07/29/14 2219  WBC 6.4  --   NEUTROABS 2.4  --   HGB 14.7 16.7*  HCT 43.8 49.0*  MCV 98.2  --   PLT 160  --     Cardiac Enzymes: No results for input(s): CKTOTAL, CKMB, CKMBINDEX, TROPONINI in the last 168 hours.  BNP: Invalid input(s): POCBNP  CBG:  Recent Labs Lab 07/29/14 2230  GLUCAP 28*    Microbiology: Results for orders placed or performed during the hospital encounter of 10/09/13  Urine culture     Status: None   Collection Time: 10/09/13 10:30 PM  Result Value Ref Range Status   Specimen Description URINE, RANDOM  Final   Special Requests NONE  Final   Culture  Setup Time   Final    10/10/2013 00:05 Performed at [place] Performed at [facility]  Final   Culture NO GROWTH Performed at [facility]  Final   Report Status 10/10/2013 FINAL  Final  Culture, sputum-assessment     Status: None   Collection Time: 10/10/13  8:12 AM  Result Value Ref Range Status   Specimen Description SPUTUM  Final   Special Requests NONE  Final   Sputum evaluation   Final    MICROSCOPIC FINDINGS SUGGEST THAT THIS SPECIMEN IS NOT REPRESENTATIVE OF LOWER RESPIRATORY SECRETIONS. PLEASE RECOLLECT. CALLED TO Q HOBLEY,RN 10/10/13 0915 BY K SCHULTZ   Report Status 10/10/2013 FINAL  Final     Coagulation Studies:  Recent Labs  07/29/14 2203  LABPROT 14.6  INR 1.13    Urinalysis: No results for input(s): COLORURINE, LABSPEC, PHURINE, GLUCOSEU, HGBUR, BILIRUBINUR, KETONESUR, PROTEINUR, UROBILINOGEN, NITRITE, LEUKOCYTESUR in the last 168 hours.  Invalid input(s): APPERANCEUR  Lipid Panel:     Component Value Date/Time   CHOL 134 10/15/2013 1500   TRIG 65.0 10/15/2013 1500   HDL 38.80* 10/15/2013 1500   CHOLHDL 3 10/15/2013 1500   VLDL 13.0 10/15/2013 1500   LDLCALC 82 10/15/2013 1500    HgbA1C:  Lab Results  Component Value Date   HGBA1C  6.1* 10/11/2013    Urine Drug Screen:  No results found for: LABOPIA, COCAINSCRNUR, LABBENZ, AMPHETMU, THCU, LABBARB  Alcohol Level:  Recent Labs Lab 07/29/14 [place] <11    Other results: EKG: NSR 75bpm  Imaging: Ct Head Wo Contrast  07/29/2014   CLINICAL DATA:  Left-sided weakness with slurred speech.  EXAM: CT HEAD WITHOUT CONTRAST  TECHNIQUE: Contiguous axial images were obtained from the base of the skull through the vertex without intravenous contrast.  COMPARISON:  None.  FINDINGS: Ventricles and sulci appear symmetrical. No mass effect or midline shift. No abnormal extra-axial fluid collections. Gray-white matter junctions are distinct. Basal cisterns are not effaced. No evidence of acute intracranial hemorrhage. No depressed skull fractures. Visualized paranasal sinuses and mastoid air cells are not opacified.  IMPRESSION: Normal   Electronically Signed   By: Lucienne Capers M.D.   On: 07/29/2014 22:44    Assessment: 63 y.o. female with history of HTN presenting for evaluation of acute onset slurred speech and left upper extremity. NIHSS score of 2 in ED. Blood sugar found to be 58 in ED. tPA not given due to mild improving symptoms. Differential would include small vessel infarct vs potential hypoglycemia induced symptoms.   1)rule out CVA 2) Hypoglycemia 3)HTN  Stroke Risk Factors - HTN  Plan: 1.  HgbA1c, fasting lipid panel 2. MRI, MRA  of the brain without contrast 3. PT consult, OT consult, Speech consult 4. Echocardiogram 5. Carotid dopplers 6. Prophylactic therapy-start ASA 325mg  7. Risk factor modification 8. Telemetry monitoring 9. Frequent neuro checks 10. NPO until RN stroke swallow screen   Jim Like, DO [facility] [phone]  If 7pm- 7am, please page neurology on call as listed in [place]. 07/29/2014, 10:55 PM

## 2014-07-29 NOTE — Progress Notes (Addendum)
Code Stroke called for 63 yo female. Pertinent history includes HTN and COPD, she wear 02 at home. Pt admits to having left side weakness and family noticed slurred speech tonight at 32. She was brought to [facility] per family vehicle and upon arrival her symptoms had resolved. Around 2200 pt began having slurred speech and Code stroke was called per ED MD. Stat CT completed and pt brought to ED room. CT negative. CBG 58,  1 amp d50 given at  2230. CBG rechecked yielding 140 .  NIHSS competed, yielding 2 for mild ataxia in lower limbs and sensory deficit on left side of face and right lower extremity. Pt to have close monitoring while within TPA window until 0030 tonight. Pt to be admitted for full stroke workup. Patient and family updated on plan of care.

## 2014-07-30 ENCOUNTER — Observation Stay (HOSPITAL_COMMUNITY): Payer: Commercial Managed Care - HMO

## 2014-07-30 DIAGNOSIS — G458 Other transient cerebral ischemic attacks and related syndromes: Secondary | ICD-10-CM

## 2014-07-30 DIAGNOSIS — G459 Transient cerebral ischemic attack, unspecified: Secondary | ICD-10-CM | POA: Diagnosis present

## 2014-07-30 DIAGNOSIS — I519 Heart disease, unspecified: Secondary | ICD-10-CM

## 2014-07-30 LAB — COMPREHENSIVE METABOLIC PANEL
ALT: 23 U/L (ref 0–35)
AST: 61 U/L — ABNORMAL HIGH (ref 0–37)
Albumin: 2.7 g/dL — ABNORMAL LOW (ref 3.5–5.2)
Alkaline Phosphatase: 103 U/L (ref 39–117)
Anion gap: 12 (ref 5–15)
BILIRUBIN TOTAL: 0.7 mg/dL (ref 0.3–1.2)
BUN: 10 mg/dL (ref 6–23)
CO2: 24 meq/L (ref 19–32)
Calcium: 8.9 mg/dL (ref 8.4–10.5)
Chloride: 98 mEq/L (ref 96–112)
Creatinine, Ser: 0.95 mg/dL (ref 0.50–1.10)
GFR, EST AFRICAN AMERICAN: 73 mL/min — AB (ref >90)
GFR, EST NON AFRICAN AMERICAN: 63 mL/min — AB (ref >90)
GLUCOSE: 87 mg/dL (ref 70–99)
Potassium: 3.8 mEq/L (ref 3.7–5.3)
SODIUM: 134 meq/L — AB (ref 137–147)
Total Protein: 8.2 g/dL (ref 6.0–8.3)

## 2014-07-30 LAB — GLUCOSE, CAPILLARY
GLUCOSE-CAPILLARY: 100 mg/dL — AB (ref 70–99)
Glucose-Capillary: 111 mg/dL — ABNORMAL HIGH (ref 70–99)

## 2014-07-30 LAB — CBC WITH DIFFERENTIAL/PLATELET
Basophils Absolute: 0 10*3/uL (ref 0.0–0.1)
Basophils Relative: 1 % (ref 0–1)
Eosinophils Absolute: 0.2 10*3/uL (ref 0.0–0.7)
Eosinophils Relative: 3 % (ref 0–5)
HEMATOCRIT: 41.1 % (ref 36.0–46.0)
Hemoglobin: 13.9 g/dL (ref 12.0–15.0)
LYMPHS ABS: 3.4 10*3/uL (ref 0.7–4.0)
LYMPHS PCT: 52 % — AB (ref 12–46)
MCH: 32.4 pg (ref 26.0–34.0)
MCHC: 33.8 g/dL (ref 30.0–36.0)
MCV: 95.8 fL (ref 78.0–100.0)
Monocytes Absolute: 0.4 10*3/uL (ref 0.1–1.0)
Monocytes Relative: 5 % (ref 3–12)
NEUTROS ABS: 2.6 10*3/uL (ref 1.7–7.7)
Neutrophils Relative %: 39 % — ABNORMAL LOW (ref 43–77)
Platelets: 158 10*3/uL (ref 150–400)
RBC: 4.29 MIL/uL (ref 3.87–5.11)
RDW: 13.3 % (ref 11.5–15.5)
WBC: 6.6 10*3/uL (ref 4.0–10.5)

## 2014-07-30 LAB — LIPID PANEL
Cholesterol: 108 mg/dL (ref 0–200)
HDL: 27 mg/dL — AB (ref >39)
LDL CALC: 63 mg/dL (ref 0–99)
Total CHOL/HDL Ratio: 4 RATIO
Triglycerides: 88 mg/dL (ref <150)
VLDL: 18 mg/dL (ref 0–40)

## 2014-07-30 LAB — PROTIME-INR
INR: 1.12 (ref 0.00–1.49)
Prothrombin Time: 14.5 seconds (ref 11.6–15.2)

## 2014-07-30 LAB — HEMOGLOBIN A1C
HEMOGLOBIN A1C: 6.1 % — AB (ref <5.7)
Mean Plasma Glucose: 128 mg/dL — ABNORMAL HIGH (ref <117)

## 2014-07-30 MED ORDER — INFLUENZA VAC SPLIT QUAD 0.5 ML IM SUSY: 0.5000 mL | PREFILLED_SYRINGE | INTRAMUSCULAR | Status: DC

## 2014-07-30 MED ORDER — LORAZEPAM 1 MG PO TABS: 2.0000 mg | ORAL_TABLET | Freq: Every day | ORAL | Status: DC

## 2014-07-30 MED ORDER — METOPROLOL SUCCINATE ER 100 MG PO TB24
100.0000 mg | ORAL_TABLET | Freq: Every morning | ORAL | Status: DC
  Filled 2014-07-30: qty 1

## 2014-07-30 MED ORDER — ASPIRIN 300 MG RE SUPP: 300.0000 mg | Freq: Once | RECTAL | Status: DC

## 2014-07-30 MED ORDER — ASPIRIN EC 325 MG PO TBEC
325.0000 mg | DELAYED_RELEASE_TABLET | Freq: Every day | ORAL | Status: DC
  Administered 2014-07-30: 325 mg via ORAL
  Filled 2014-07-30: qty 1

## 2014-07-30 MED ORDER — HYDROCHLOROTHIAZIDE 25 MG PO TABS
25.0000 mg | ORAL_TABLET | Freq: Every day | ORAL | Status: DC
  Administered 2014-07-30: 25 mg via ORAL
  Filled 2014-07-30: qty 1

## 2014-07-30 MED ORDER — LORAZEPAM 2 MG/ML IJ SOLN
0.5000 mg | Freq: Once | INTRAMUSCULAR | Status: AC | PRN
  Administered 2014-07-30: 0.5 mg via INTRAVENOUS
  Filled 2014-07-30: qty 1

## 2014-07-30 MED ORDER — ALBUTEROL SULFATE (2.5 MG/3ML) 0.083% IN NEBU: 2.5000 mg | INHALATION_SOLUTION | Freq: Four times a day (QID) | RESPIRATORY_TRACT | Status: DC | PRN

## 2014-07-30 MED ORDER — LOSARTAN POTASSIUM-HCTZ 100-25 MG PO TABS: 1.0000 | ORAL_TABLET | Freq: Every day | ORAL | Status: DC

## 2014-07-30 MED ORDER — ASPIRIN EC 81 MG PO TBEC: 81.0000 mg | DELAYED_RELEASE_TABLET | Freq: Every day | ORAL | Status: DC

## 2014-07-30 MED ORDER — ENOXAPARIN SODIUM 40 MG/0.4ML ~~LOC~~ SOLN
40.0000 mg | Freq: Every day | SUBCUTANEOUS | Status: DC
  Administered 2014-07-30: 40 mg via SUBCUTANEOUS
  Filled 2014-07-30: qty 0.4

## 2014-07-30 MED ORDER — DIPHENHYDRAMINE HCL 25 MG PO CAPS
25.0000 mg | ORAL_CAPSULE | Freq: Every day | ORAL | Status: DC
  Filled 2014-07-30: qty 1

## 2014-07-30 MED ORDER — ALBUTEROL SULFATE HFA 108 (90 BASE) MCG/ACT IN AERS: 2.0000 | INHALATION_SPRAY | Freq: Four times a day (QID) | RESPIRATORY_TRACT | Status: DC | PRN

## 2014-07-30 MED ORDER — ASPIRIN 325 MG PO TBEC: 325.0000 mg | DELAYED_RELEASE_TABLET | Freq: Every day | ORAL | Status: DC

## 2014-07-30 MED ORDER — ZOLPIDEM TARTRATE 5 MG PO TABS: 10.0000 mg | ORAL_TABLET | Freq: Every day | ORAL | Status: DC

## 2014-07-30 MED ORDER — METOPROLOL SUCCINATE ER 100 MG PO TB24
100.0000 mg | ORAL_TABLET | Freq: Every morning | ORAL | Status: DC
Start: 2014-07-31 — End: 2014-07-30

## 2014-07-30 MED ORDER — STROKE: EARLY STAGES OF RECOVERY BOOK
Freq: Once | Status: DC
  Filled 2014-07-30: qty 1

## 2014-07-30 MED ORDER — LOSARTAN POTASSIUM 50 MG PO TABS
100.0000 mg | ORAL_TABLET | Freq: Every day | ORAL | Status: DC
  Administered 2014-07-30: 100 mg via ORAL
  Filled 2014-07-30: qty 2

## 2014-07-30 MED ORDER — ZOLPIDEM TARTRATE 5 MG PO TABS
5.0000 mg | ORAL_TABLET | Freq: Every evening | ORAL | Status: DC | PRN
  Administered 2014-07-30: 5 mg via ORAL

## 2014-07-30 MED ORDER — ZOLPIDEM TARTRATE 5 MG PO TABS
5.0000 mg | ORAL_TABLET | Freq: Every day | ORAL | Status: DC
  Filled 2014-07-30: qty 1

## 2014-07-30 MED ORDER — DIPHENHYDRAMINE HCL 50 MG/ML IJ SOLN: 12.5000 mg | Freq: Every evening | INTRAMUSCULAR | Status: DC | PRN

## 2014-07-30 NOTE — Progress Notes (Signed)
Patient arrived to 32N03. Alert and oriented x 4. Telemetry placed. Patient assessed and oriented to room and unit. Call bell in reach. Will continue to monitor patient. Ileene Rubens Viana Sleep,RN

## 2014-07-30 NOTE — Progress Notes (Signed)
Patient called RN to the room because she "felt funny". She stated she felt weak on her left side. No drift noted in left or right arm, or left and right legs. No facial droop. Patient confirmed her abdomen felt weak but upon assessment she felt the prick better on her left side and lesser on the right side. Patient said this has happened before " on occasions" and laying on the effected side helps her. She proceeded to lay on her right side and said she felt better immediately after rolling over. Patient may have mixed up which side felt weird initially but I will continue to monitor her closely. Burnell Blanks, RN

## 2014-07-30 NOTE — Progress Notes (Signed)
UR completed 

## 2014-07-30 NOTE — Progress Notes (Signed)
Occupational Therapy Evaluation Patient Details Name: Sue [name] MRN: [id number] DOB: 03-17-1951 Today's Date: 07/30/2014    History of Present Illness  63 y.o. female admitted from home by EMS for L UE weakness and slurred speech. Blood sugar in ED found to be 58. Given D50 in ED. CT (-) MRI (-)PMH: COPD wears oxygen at baseline, incontinence of bladder and HTN    Clinical Impression   Patient evaluated by Occupational Therapy with no further acute OT needs identified. All education has been completed and the patient has no further questions. See below for any follow-up Occupational Therapy or equipment needs. OT to sign off. Thank you for referral.      Follow Up Recommendations  No OT follow up;Supervision - Intermittent    Equipment Recommendations  Tub/shower bench;Other (comment) (RW)    Recommendations for Other Services       Precautions / Restrictions Precautions Precaution Comments: 2L oxygen required at baseline,EC handout provided and reviewed Restrictions Weight Bearing Restrictions: No      Mobility Bed Mobility Overal bed mobility: Needs Assistance Bed Mobility: Supine to Sit     Supine to sit: Supervision     General bed mobility comments: requires use of bed rail  Transfers Overall transfer level: Needs assistance Equipment used: Rolling walker (2 wheeled) Transfers: Stand Pivot Transfers   Stand pivot transfers: Min guard       General transfer comment: x3 attempts and placing R UE on bed rail to push up    Balance Overall balance assessment: No apparent balance deficits (not formally assessed) Sitting-balance support: No upper extremity supported;Feet supported Sitting balance-Leahy Scale: Good Sitting balance - Comments: denied any dizziness   Standing balance support: No upper extremity supported;During functional activity Standing balance-Leahy Scale: Good Standing balance comment: No overt LOB or reports of dizziness             High level balance activites: Head turns;Direction changes;Sudden stops;Turns;Backward walking High Level Balance Comments: no LOB noted; no c/o dizziness            ADL Overall ADL's : Needs assistance/impaired     Grooming: Wash/dry hands;Modified independent;Standing                   Toilet Transfer: Min guard;Ambulation;RW;Grab Information systems manager Details (indicate cue type and reason): Min guard (A) for safety/balance Toileting- Clothing Manipulation and Hygiene: Modified independent;Sitting/lateral lean       Functional mobility during ADLs: Supervision/safety;Rolling walker General ADL Comments: Pt used RW to ambulate into bathroom and stated "I don't use one of these." Pt ambulated around bathroom and to recliner without RW. Pt wanted to wait for daughter to bring toiletries to complete self-care ADLs.     Vision                     Perception     Praxis      Pertinent Vitals/Pain Pain Assessment: No/denies pain     Hand Dominance Right   Extremity/Trunk Assessment Upper Extremity Assessment Upper Extremity Assessment: Generalized weakness;LUE deficits/detail LUE Deficits / Details: Decreased light touch  LUE Sensation: decreased light touch   Lower Extremity Assessment Lower Extremity Assessment: Defer to PT evaluation   Cervical / Trunk Assessment Cervical / Trunk Assessment: Normal   Communication Communication Communication: No difficulties;Other (comment) (pt does c/o about some dysartrhic speech intermittently)   Cognition Arousal/Alertness: Awake/alert Behavior During Therapy: WFL for tasks assessed/performed Overall Cognitive Status: Within Functional Limits for tasks  assessed                     General Comments       Exercises       Shoulder Instructions      Home Living Family/patient expects to be discharged to:: Private residence Living Arrangements: Children;Other (Comment);Other  relatives Available Help at Discharge: Family;Available 24 hours/day Type of Home: Apartment Home Access: Level entry     Home Layout: One level     Bathroom Shower/Tub: Walk-in Hydrologist: Handicapped height     Home Equipment: Grab bars - tub/shower;Electric scooter (furniture walks in the house)          Prior Functioning/Environment Level of Independence: Independent        Comments: pt ambulates short distances only due to decr activity tolerance; rides scooter at grocery store    OT Diagnosis: Generalized weakness   OT Problem List: Decreased strength;Decreased range of motion;Decreased activity tolerance;Decreased coordination;Decreased safety awareness;Decreased knowledge of use of DME or AE   OT Treatment/Interventions:      OT Goals(Current goals can be found in the care plan section) Acute Rehab OT Goals Patient Stated Goal: to move out of daughters house and get a place of her own OT Goal Formulation: With patient Time For Goal Achievement: 08/13/14 Potential to Achieve Goals: Good  OT Frequency:     Barriers to D/C:            Co-evaluation              End of Session Equipment Utilized During Treatment: Rolling walker;Oxygen Nurse Communication: Mobility status;Precautions  Activity Tolerance: Patient tolerated treatment well Patient left: in chair;with call bell/phone within reach;with chair alarm set   Time: [id number] OT Time Calculation (min): 15 min Charges:  OT General Charges $OT Visit: 1 Procedure G-Codes: OT G-codes **NOT FOR INPATIENT CLASS** Functional Assessment Tool Used: clinical observation Functional Limitation: Self care Self Care Current Status [id number]): At least 1 percent but less than 20 percent impaired, limited or restricted Self Care Goal Status [id number]): At least 1 percent but less than 20 percent impaired, limited or restricted Self Care Discharge Status [phone]): At least 1 percent but less  than 20 percent impaired, limited or restricted  Sue [name] 07/30/2014, 12:13 PM

## 2014-07-30 NOTE — Progress Notes (Signed)
SLP Cancellation Note  Patient Details Name: Sue [name] MRN: [id number] DOB: 11/16/1950   Cancelled treatment:       Reason Eval/Treat Not Completed: Other (comment) Pt passed RN stroke team and is on a diet, therefore SLP swallow eval not warranted per protocol.    Juan Quam Laurice 07/30/2014, 3:49 PM

## 2014-07-30 NOTE — Discharge Summary (Signed)
PATIENT DETAILS Name: Sue [name] Age: 63 y.o. Sex: female Date of Birth: [birth year] MRN: [id number]. Admitting Physician: Berle Mull, MD [id number] Etter Sjogren, DO  Admit Date: 07/29/2014 Discharge date: 07/30/2014  Recommendations for Outpatient Follow-up:  1. Had one episode of hypoglycemia, if it reoccurs may need further workup and endocrinology referral.  PRIMARY DISCHARGE DIAGNOSIS:  Principal Problem:   TIA (transient ischemic attack) Active Problems:   HTN (hypertension)   Obesity (BMI 30-39.9)   Depression with anxiety   Insomnia   Chronic respiratory failure      PAST MEDICAL HISTORY: Past Medical History  Diagnosis Date  . Hypertension   . Emphysema/COPD   . Depression   . GERD (gastroesophageal reflux disease)   . Urine incontinence     DISCHARGE MEDICATIONS: Current Discharge Medication List    START taking these medications   Details  aspirin EC 325 MG EC tablet Take 1 tablet (325 mg total) by mouth daily. Qty: 60 tablet, Refills: 0      CONTINUE these medications which have NOT CHANGED   Details  albuterol (PROVENTIL HFA;VENTOLIN HFA) 108 (90 BASE) MCG/ACT inhaler Inhale 2 puffs into the lungs every 6 (six) hours as needed for wheezing or shortness of breath. Qty: 1 Inhaler, Refills: 0   Associated Diagnoses: Wheezing    doxylamine, Sleep, (UNISOM) 25 MG tablet Take 25 mg by mouth at bedtime.    LORazepam (ATIVAN) 2 MG tablet Take 2 mg by mouth at bedtime.    losartan-hydrochlorothiazide (HYZAAR) 100-25 MG per tablet Take 1 tablet by mouth daily. Qty: 90 tablet, Refills: 3   Associated Diagnoses: HTN (hypertension)    metoprolol succinate (TOPROL-XL) 50 MG 24 hr tablet Take 2 tablets (100 mg total) by mouth every morning. Take with or immediately following a meal. Qty: 60 tablet, Refills: 1    OXYGEN Inhale 2 L into the lungs continuous.    zolpidem (AMBIEN) 10 MG tablet Take 10 mg by mouth at bedtime.     ALPRAZolam (XANAX) 0.5 MG  tablet Take 1 tablet (0.5 mg total) by mouth 2 (two) times daily as needed for anxiety. Qty: 60 tablet, Refills: 0   Associated Diagnoses: Anxiety state, unspecified    Potassium Chloride ER 20 MEQ TBCR Take 20 mEq by mouth daily. Qty: 30 tablet, Refills: 5      STOP taking these medications     azithromycin (ZITHROMAX Z-PAK) 250 MG tablet         ALLERGIES:  No Known Allergies  BRIEF HPI:  See H&P, Labs, Consult and Test reports for all details in brief, patient is a 63 year old female with past medical history of COPD, chronic respiratory failure on home O2, hypertension, insomnia who presented withtransient difficulty of speech and questionable weakness of her left arm.  CONSULTATIONS:   neurology  PERTINENT RADIOLOGIC STUDIES: Ct Head Wo Contrast  07/29/2014   CLINICAL DATA:  Left-sided weakness with slurred speech.  EXAM: CT HEAD WITHOUT CONTRAST  TECHNIQUE: Contiguous axial images were obtained from the base of the skull through the vertex without intravenous contrast.  COMPARISON:  None.  FINDINGS: Ventricles and sulci appear symmetrical. No mass effect or midline shift. No abnormal extra-axial fluid collections. Gray-white matter junctions are distinct. Basal cisterns are not effaced. No evidence of acute intracranial hemorrhage. No depressed skull fractures. Visualized paranasal sinuses and mastoid air cells are not opacified.  IMPRESSION: Normal   Electronically Signed   By: Lucienne Capers M.D.   On: 07/29/2014 22:44  Mri Brain Without Contrast  07/30/2014   CLINICAL DATA:  TIA.  Speech abnormality.  Left-sided weakness.  EXAM: MRI HEAD WITHOUT CONTRAST  MRA HEAD WITHOUT CONTRAST  TECHNIQUE: Multiplanar, multiecho pulse sequences of the brain and surrounding structures were obtained without intravenous contrast. Angiographic images of the head were obtained using MRA technique without contrast.  COMPARISON:  CT head 07/29/2014  FINDINGS: MRI HEAD FINDINGS  Negative for  acute infarct.  Ventricle size is normal.  Cerebral volume is normal.  Scattered small hyperintensities in the subcortical and periventricular white matter bilaterally. 1 cm hyperintensity in the right frontal temporal white matter. Brainstem and cerebellum are normal.  Negative for hemorrhage or mass.  No edema.  Paranasal sinuses are clear.  MRA HEAD FINDINGS  Both vertebral arteries are patent to the basilar without stenosis. Basilar appears normal. Small PICA patent bilaterally. Superior cerebellar and posterior cerebral arteries are patent bilaterally. Posterior communicating artery patent bilaterally  Internal carotid artery widely patent bilaterally with mild atherosclerotic irregularity. Anterior and middle cerebral arteries widely patent bilaterally without stenosis  Negative for cerebral aneurysm.  IMPRESSION: Negative for acute infarct. Mild chronic white matter changes most likely related to microvascular ischemia  Negative MRA head.   Electronically Signed   By: Franchot Gallo M.D.   On: 07/30/2014 08:38   [name] Head/brain Wo Cm  07/30/2014   CLINICAL DATA:  TIA.  Speech abnormality.  Left-sided weakness.  EXAM: MRI HEAD WITHOUT CONTRAST  MRA HEAD WITHOUT CONTRAST  TECHNIQUE: Multiplanar, multiecho pulse sequences of the brain and surrounding structures were obtained without intravenous contrast. Angiographic images of the head were obtained using MRA technique without contrast.  COMPARISON:  CT head 07/29/2014  FINDINGS: MRI HEAD FINDINGS  Negative for acute infarct.  Ventricle size is normal.  Cerebral volume is normal.  Scattered small hyperintensities in the subcortical and periventricular white matter bilaterally. 1 cm hyperintensity in the right frontal temporal white matter. Brainstem and cerebellum are normal.  Negative for hemorrhage or mass.  No edema.  Paranasal sinuses are clear.  MRA HEAD FINDINGS  Both vertebral arteries are patent to the basilar without stenosis. Basilar appears  normal. Small PICA patent bilaterally. Superior cerebellar and posterior cerebral arteries are patent bilaterally. Posterior communicating artery patent bilaterally  Internal carotid artery widely patent bilaterally with mild atherosclerotic irregularity. Anterior and middle cerebral arteries widely patent bilaterally without stenosis  Negative for cerebral aneurysm.  IMPRESSION: Negative for acute infarct. Mild chronic white matter changes most likely related to microvascular ischemia  Negative MRA head.   Electronically Signed   By: Franchot Gallo M.D.   On: 07/30/2014 08:38     PERTINENT LAB RESULTS: CBC:  Recent Labs  07/29/14 2203 07/29/14 2219 07/30/14 0708  WBC 6.4  --  6.6  HGB 14.7 16.7* 13.9  HCT 43.8 49.0* 41.1  PLT 160  --  158   CMET CMP     Component Value Date/Time   NA 134* 07/30/2014 0708   K 3.8 07/30/2014 0708   CL 98 07/30/2014 0708   CO2 24 07/30/2014 0708   GLUCOSE 87 07/30/2014 0708   BUN 10 07/30/2014 0708   CREATININE 0.95 07/30/2014 0708   CALCIUM 8.9 07/30/2014 0708   PROT 8.2 07/30/2014 0708   ALBUMIN 2.7* 07/30/2014 0708   AST 61* 07/30/2014 0708   ALT 23 07/30/2014 0708   ALKPHOS 103 07/30/2014 0708   BILITOT 0.7 07/30/2014 0708   GFRNONAA 63* 07/30/2014 0708   GFRAA 73*  07/30/2014 0708    GFR Estimated Creatinine Clearance: 81.7 mL/min (by C-G formula based on Cr of 0.95). No results for input(s): LIPASE, AMYLASE in the last 72 hours. No results for input(s): CKTOTAL, CKMB, CKMBINDEX, TROPONINI in the last 72 hours. Invalid input(s): POCBNP No results for input(s): DDIMER in the last 72 hours.  Recent Labs  07/30/14 0708  HGBA1C 6.1*    Recent Labs  07/30/14 0708  CHOL 108  HDL 27*  LDLCALC 63  TRIG 88  CHOLHDL 4.0   No results for input(s): TSH, T4TOTAL, T3FREE, THYROIDAB in the last 72 hours.  Invalid input(s): FREET3 No results for input(s): VITAMINB12, FOLATE, FERRITIN, TIBC, IRON, RETICCTPCT in the last 72  hours. Coags:  Recent Labs  07/29/14 2203 07/30/14 0708  INR 1.13 1.12   Microbiology: No results found for this or any previous visit (from the past 240 hour(s)).   BRIEF HOSPITAL COURSE:   Principal Problem: TIA (transient ischemic attack):presented with dysarthria and left arm weakness.Her symptoms have now mostly resolved.She was also found to be mildly hypoglycemic in the ED. Some concern for TIA but symptoms could have been from hypoglycemia as well, as a result admitted to the hospital. MRI brain negative for acute CVA.LDL at 63, A1c6.1. Echocardiogram was neg for embolic source and carotid Doppler was neg for significant stenosis.  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 She claims that she was probably hypoglycemic yesterday as she had nothing to eat during the daytime.A1C as above. No further episodes of hypoglycemia in the hospital.Encourage oral intake and follow.If further episodes occur, will need outpatient Endocrine referral.   HTN (hypertension):continue with metoprolol, HCTZ and losartan cautiously.   Anxiety disorder:stable, continue with as needed Ativan.   Obesity (BMI 30-39.9):counseled regarding importance of weight loss   COPD:lungs clear, stable at present   Chronic respiratory failure:on home O2. Currently stable.  TODAY-DAY OF DISCHARGE:  Subjective:   Lakynn Brietzke today has no headache,no chest abdominal pain,no new weakness tingling or numbness, feels much better wants to go home today.   Objective:   Blood pressure 106/60, pulse 70, temperature 97.9 F (36.6 C), temperature source Oral, resp. rate 18, height 5\' 3"  (1.6 m), weight 132.1 kg (291 lb 3.6 oz), SpO2 97 %. No intake or output data in the 24 hours ending 07/30/14 1500 Filed Weights   07/29/14 2138  07/30/14 0120  Weight: 113.399 kg (250 lb) 132.1 kg (291 lb 3.6 oz)    Exam Awake Alert, Oriented *3, No new F.N deficits, Normal affect [place].AT,PERRAL Supple Neck,No JVD, No cervical lymphadenopathy appriciated.  Symmetrical Chest wall movement, Good air movement bilaterally, CTAB RRR,No Gallops,Rubs or new Murmurs, No Parasternal Heave +ve B.Sounds, Abd Soft, Non tender, No organomegaly appriciated, No rebound -guarding or rigidity. No Cyanosis, Clubbing or edema, No new Rash or bruise  DISCHARGE CONDITION: Stable  DISPOSITION: Home  DISCHARGE INSTRUCTIONS:    Activity:  As tolerated   Diet recommendation: Diabetic Diet Heart Healthy diet  Discharge Instructions    Call MD for:  extreme fatigue    Complete by:  As directed      Call MD for:  persistant dizziness or light-headedness    Complete by:  As directed      Diet - low sodium heart healthy    Complete by:  As directed  Increase activity slowly    Complete by:  As directed            Follow-up Information    Follow up with [facility].   Specialty:  Neurology   Why:  follow up as needed   Contact information:   [address] [place] [phone]      Follow up with Garnet Koyanagi, DO On 08/01/2014.   Specialty:  Family Medicine   Why:  Keep appt   Contact information:   [address] [place] [address] [phone]       Total Time spent on discharge equals 45 minutes.  SignedOren Binet 07/30/2014 3:00 PM

## 2014-07-30 NOTE — Progress Notes (Addendum)
PATIENT DETAILS Name: Sue [name] Age: 63 y.o. Sex: female Date of Birth: 12/20/1950 Admit Date: 07/29/2014 Admitting Physician Berle Mull, MD [id number] Etter Sjogren, DO  Subjective: Feels much better.Speech clear this am.  Assessment/Plan: Principal Problem:   TIA (transient ischemic attack):presented with dysarthria and left arm weakness.Her symptoms have now mostly resolved.she was also found to be mildly hypoglycemic in the ED. Some concern for TIA but symptoms could have been from hypoglycemia as well, as a result admitted to the hospital. MRI brain negative for acute CVA.LDL at 63, A1c currently pending. Echocardiogram and carotid Doppler also pending. Continue aspirin, await further recommendations from neurology.  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 She claims that she was probably hypoglycemic yesterday as she had nothing to eat during the daytime.Await A1c, encourage oral intake and follow.    HTN (hypertension):continue with metoprolol, HCTZ and losartan cautiously.    Anxiety disorder:stable, continue with as needed Ativan.    Obesity (BMI 30-39.9):counseled regarding importance of weight loss    COPD:lungs clear, stable at present    Chronic respiratory failure:on home O2. Currently stable.  Disposition: Remain inpatient-home when workup complete  Antibiotics:  None   Anti-infectives    None      DVT Prophylaxis: Prophylactic Lovenox   Code Status: Full code  Family Communication None at bedside-however patient awake and alert.  Procedures:  none  CONSULTS:  neurology  Time spent 40 minutes-which includes 50% of the time with face-to-face with patient/ family and coordinating care related to the above assessment and plan.  MEDICATIONS: Scheduled Meds: .  stroke: mapping our  early stages of recovery book   Does not apply Once  . aspirin EC  325 mg Oral Daily  . diphenhydrAMINE  25 mg Oral QHS  . enoxaparin (LOVENOX) injection  40 mg Subcutaneous Daily  . losartan  100 mg Oral Daily   And  . hydrochlorothiazide  25 mg Oral Daily  . [START ON 07/31/2014] Influenza vac split quadrivalent PF  0.5 mL Intramuscular Tomorrow-1000  . LORazepam  2 mg Oral QHS  . metoprolol succinate  100 mg Oral q morning - 10a  . zolpidem  5 mg Oral QHS   Continuous Infusions:  PRN Meds:.albuterol, diphenhydrAMINE, zolpidem    PHYSICAL EXAM: Vital signs in last 24 hours: Filed Vitals:   07/30/14 0031 07/30/14 0045 07/30/14 0120 07/30/14 0547  BP:  113/67 142/51 111/57  Pulse:  71 70 76  Temp: 98.7 F (37.1 C)  97.7 F (36.5 C) 98.2 F (36.8 C)  TempSrc:   Oral Oral  Resp:  14 18 18   Height:   5\' 3"  (1.6 m)   Weight:   132.1 kg (291 lb 3.6 oz)   SpO2:  100% 98% 97%    Weight change:  Filed Weights   07/29/14 2138 07/30/14 0120  Weight: 113.399 kg (250 lb) 132.1 kg (291 lb 3.6 oz)   Body mass index is 51.6 kg/(m^2).   Gen Exam: Awake and alert with clear speech.   Neck: Supple, No JVD.   Chest: B/L Clear.   CVS: S1 S2 Regular, no murmurs.  Abdomen: soft, BS +, non tender, non distended.  Extremities: no edema, lower extremities warm to touch. Neurologic: Non Focal.   Skin: No Rash.   Wounds: N/A.  Intake/Output from previous day: No intake or output data in the 24 hours ending 07/30/14 1109   LAB RESULTS: CBC  Recent Labs Lab 07/29/14 2203 07/29/14 2219 07/30/14 0708  WBC 6.4  --  6.6  HGB 14.7 16.7* 13.9  HCT 43.8 49.0* 41.1  PLT 160  --  158  MCV 98.2  --  95.8  MCH 33.0  --  32.4  MCHC 33.6  --  33.8  RDW 13.0  --  13.3  LYMPHSABS 3.3  --  3.4  MONOABS 0.5  --  0.4  EOSABS 0.2  --  0.2  BASOSABS 0.0  --  0.0    Chemistries   Recent Labs Lab 07/29/14 2203 07/29/14 2219 07/30/14 0708  NA 136* 140 134*  K 3.7 3.4* 3.8  CL 98  98 98  CO2 26  --  24  GLUCOSE 81 84 87  BUN 10 10 10   CREATININE 1.07 1.10 0.95  CALCIUM 9.3  --  8.9    CBG:  Recent Labs Lab 07/29/14 2230 07/29/14 2318  GLUCAP 58* 140*    GFR Estimated Creatinine Clearance: 81.7 mL/min (by C-G formula based on Cr of 0.95).  Coagulation profile  Recent Labs Lab 07/29/14 2203 07/30/14 0708  INR 1.13 1.12    Cardiac Enzymes No results for input(s): CKMB, TROPONINI, MYOGLOBIN in the last 168 hours.  Invalid input(s): CK  Invalid input(s): POCBNP No results for input(s): DDIMER in the last 72 hours. No results for input(s): HGBA1C in the last 72 hours.  Recent Labs  07/30/14 0708  CHOL 108  HDL 27*  LDLCALC 63  TRIG 88  CHOLHDL 4.0   No results for input(s): TSH, T4TOTAL, T3FREE, THYROIDAB in the last 72 hours.  Invalid input(s): FREET3 No results for input(s): VITAMINB12, FOLATE, FERRITIN, TIBC, IRON, RETICCTPCT in the last 72 hours. No results for input(s): LIPASE, AMYLASE in the last 72 hours.  Urine Studies No results for input(s): UHGB, CRYS in the last 72 hours.  Invalid input(s): UACOL, UAPR, USPG, UPH, UTP, UGL, UKET, UBIL, UNIT, UROB, ULEU, UEPI, UWBC, URBC, UBAC, CAST, UCOM, BILUA  MICROBIOLOGY: No results found for this or any previous visit (from the past 240 hour(s)).  RADIOLOGY STUDIES/RESULTS: Ct Head Wo Contrast  07/29/2014   CLINICAL DATA:  Left-sided weakness with slurred speech.  EXAM: CT HEAD WITHOUT CONTRAST  TECHNIQUE: Contiguous axial images were obtained from the base of the skull through the vertex without intravenous contrast.  COMPARISON:  None.  FINDINGS: Ventricles and sulci appear symmetrical. No mass effect or midline shift. No abnormal extra-axial fluid collections. Gray-white matter junctions are distinct. Basal cisterns are not effaced. No evidence of acute intracranial hemorrhage. No depressed skull fractures. Visualized paranasal sinuses and mastoid air cells are not opacified.   IMPRESSION: Normal   Electronically Signed   By: Lucienne Capers M.D.   On: 07/29/2014 22:44   Mri Brain Without Contrast  07/30/2014   CLINICAL DATA:  TIA.  Speech abnormality.  Left-sided weakness.  EXAM: MRI HEAD WITHOUT CONTRAST  MRA HEAD WITHOUT CONTRAST  TECHNIQUE: Multiplanar, multiecho pulse sequences of the brain and surrounding structures were obtained without intravenous contrast. Angiographic images of the head were obtained using MRA technique without contrast.  COMPARISON:  CT head 07/29/2014  FINDINGS: MRI HEAD FINDINGS  Negative for acute infarct.  Ventricle size is normal.  Cerebral volume is normal.  Scattered small hyperintensities in the subcortical and periventricular white matter bilaterally. 1 cm hyperintensity in the  right frontal temporal white matter. Brainstem and cerebellum are normal.  Negative for hemorrhage or mass.  No edema.  Paranasal sinuses are clear.  MRA HEAD FINDINGS  Both vertebral arteries are patent to the basilar without stenosis. Basilar appears normal. Small PICA patent bilaterally. Superior cerebellar and posterior cerebral arteries are patent bilaterally. Posterior communicating artery patent bilaterally  Internal carotid artery widely patent bilaterally with mild atherosclerotic irregularity. Anterior and middle cerebral arteries widely patent bilaterally without stenosis  Negative for cerebral aneurysm.  IMPRESSION: Negative for acute infarct. Mild chronic white matter changes most likely related to microvascular ischemia  Negative MRA head.   Electronically Signed   By: Franchot Gallo M.D.   On: 07/30/2014 08:38   [name] Head/brain Wo Cm  07/30/2014   CLINICAL DATA:  TIA.  Speech abnormality.  Left-sided weakness.  EXAM: MRI HEAD WITHOUT CONTRAST  MRA HEAD WITHOUT CONTRAST  TECHNIQUE: Multiplanar, multiecho pulse sequences of the brain and surrounding structures were obtained without intravenous contrast. Angiographic images of the head were obtained using  MRA technique without contrast.  COMPARISON:  CT head 07/29/2014  FINDINGS: MRI HEAD FINDINGS  Negative for acute infarct.  Ventricle size is normal.  Cerebral volume is normal.  Scattered small hyperintensities in the subcortical and periventricular white matter bilaterally. 1 cm hyperintensity in the right frontal temporal white matter. Brainstem and cerebellum are normal.  Negative for hemorrhage or mass.  No edema.  Paranasal sinuses are clear.  MRA HEAD FINDINGS  Both vertebral arteries are patent to the basilar without stenosis. Basilar appears normal. Small PICA patent bilaterally. Superior cerebellar and posterior cerebral arteries are patent bilaterally. Posterior communicating artery patent bilaterally  Internal carotid artery widely patent bilaterally with mild atherosclerotic irregularity. Anterior and middle cerebral arteries widely patent bilaterally without stenosis  Negative for cerebral aneurysm.  IMPRESSION: Negative for acute infarct. Mild chronic white matter changes most likely related to microvascular ischemia  Negative MRA head.   Electronically Signed   By: Franchot Gallo M.D.   On: 07/30/2014 08:38    Oren Binet, MD  [facility] Pager:[phone]  If 7PM-7AM, please contact night-coverage [email or web address] Password [email or web address] 07/30/2014, 11:09 AM   LOS: 1 day

## 2014-07-30 NOTE — Progress Notes (Signed)
STROKE TEAM PROGRESS NOTE   HISTORY Sue [name] is an 63 y.o. female with history of HTN presenting as code stroke with acute onset slurred speech and LUE weakness. Patient notes not eating all day, was in normal state of health until 8pm when family noticed she was slurring her words and started complaining of weakness in her left upper extremity 07/29/2014 at 2000. EMS called. Symptoms improved prior to arrival in ED. While in ED RN noted worsening of slurred speech. Family feels her speech is not back to normal. Blood sugar in ED found to be 58. Given D50 in ED. Per family, patient has COPD, wears oxygen at baseline. Able to converse without difficulty. Able to ambulate short distances. No prior CVA or TIA history. NIHSS score of 2. TPA was not given due to mildness of symptoms. Benefits and risks of tPA discussed with patient and family who expressed understanding and agreement of plan. She will remain on tPA watch until 1230am 07/30/2014. She was admitted to [facility] for further evaluation and treatment.   SUBJECTIVE (INTERVAL HISTORY) She is up in the chair at the bedside.  Overall she feels her condition is stable.    OBJECTIVE Temp:  [97.7 F (36.5 C)-98.7 F (37.1 C)] 97.9 F (36.6 C) (11/18 0800) Pulse Rate:  [70-76] 73 (11/18 0800) Cardiac Rhythm:  [-] Normal sinus rhythm (11/18 0841) Resp:  [12-26] 18 (11/18 0800) BP: (111-159)/(51-111) 132/62 mmHg (11/18 0800) SpO2:  [96 %-100 %] 97 % (11/18 0547) Weight:  [113.399 kg (250 lb)-132.1 kg (291 lb 3.6 oz)] 132.1 kg (291 lb 3.6 oz) (11/18 0120)   Recent Labs Lab 07/29/14 2230 07/29/14 2318 07/30/14 0646 07/30/14 1133  GLUCAP 58* 140* 100* 111*    Recent Labs Lab 07/29/14 2203 07/29/14 2219 07/30/14 0708  NA 136* 140 134*  K 3.7 3.4* 3.8  CL 98 98 98  CO2 26  --  24  GLUCOSE 81 84 87  BUN 10 10 10   CREATININE 1.07 1.10 0.95  CALCIUM 9.3  --  8.9    Recent Labs Lab 07/29/14 2203 07/30/14 0708  AST 59* 61*  ALT  23 23  ALKPHOS 115 103  BILITOT 0.7 0.7  PROT 8.6* 8.2  ALBUMIN 3.0* 2.7*    Recent Labs Lab 07/29/14 2203 07/29/14 2219 07/30/14 0708  WBC 6.4  --  6.6  NEUTROABS 2.4  --  2.6  HGB 14.7 16.7* 13.9  HCT 43.8 49.0* 41.1  MCV 98.2  --  95.8  PLT 160  --  158   No results for input(s): CKTOTAL, CKMB, CKMBINDEX, TROPONINI in the last 168 hours.  Recent Labs  07/29/14 2203 07/30/14 0708  LABPROT 14.6 14.5  INR 1.13 1.12    Recent Labs  07/29/14 2300  COLORURINE YELLOW  LABSPEC <1.005*  PHURINE 7.5  GLUCOSEU 250*  HGBUR NEGATIVE  BILIRUBINUR NEGATIVE  KETONESUR NEGATIVE  PROTEINUR NEGATIVE  UROBILINOGEN 0.2  NITRITE NEGATIVE  LEUKOCYTESUR NEGATIVE       Component Value Date/Time   CHOL 108 07/30/2014 0708   TRIG 88 07/30/2014 0708   HDL 27* 07/30/2014 0708   CHOLHDL 4.0 07/30/2014 0708   VLDL 18 07/30/2014 0708   LDLCALC 63 07/30/2014 0708   Lab Results  Component Value Date   HGBA1C 6.1* 07/30/2014      Component Value Date/Time   LABOPIA NONE DETECTED 07/29/2014 2300   COCAINSCRNUR NONE DETECTED 07/29/2014 2300   LABBENZ NONE DETECTED 07/29/2014 2300   AMPHETMU NONE DETECTED  07/29/2014 2300   THCU NONE DETECTED 07/29/2014 2300   LABBARB NONE DETECTED 07/29/2014 2300     Recent Labs Lab 07/29/14 2203  ETH <11    Ct Head Wo Contrast  07/29/2014   CLINICAL DATA:  Left-sided weakness with slurred speech.  EXAM: CT HEAD WITHOUT CONTRAST  TECHNIQUE: Contiguous axial images were obtained from the base of the skull through the vertex without intravenous contrast.  COMPARISON:  None.  FINDINGS: Ventricles and sulci appear symmetrical. No mass effect or midline shift. No abnormal extra-axial fluid collections. Gray-white matter junctions are distinct. Basal cisterns are not effaced. No evidence of acute intracranial hemorrhage. No depressed skull fractures. Visualized paranasal sinuses and mastoid air cells are not opacified.  IMPRESSION: Normal    Electronically Signed   By: Lucienne Capers M.D.   On: 07/29/2014 22:44   Mri Brain Without Contrast  07/30/2014   CLINICAL DATA:  TIA.  Speech abnormality.  Left-sided weakness.  EXAM: MRI HEAD WITHOUT CONTRAST  MRA HEAD WITHOUT CONTRAST  TECHNIQUE: Multiplanar, multiecho pulse sequences of the brain and surrounding structures were obtained without intravenous contrast. Angiographic images of the head were obtained using MRA technique without contrast.  COMPARISON:  CT head 07/29/2014  FINDINGS: MRI HEAD FINDINGS  Negative for acute infarct.  Ventricle size is normal.  Cerebral volume is normal.  Scattered small hyperintensities in the subcortical and periventricular white matter bilaterally. 1 cm hyperintensity in the right frontal temporal white matter. Brainstem and cerebellum are normal.  Negative for hemorrhage or mass.  No edema.  Paranasal sinuses are clear.  MRA HEAD FINDINGS  Both vertebral arteries are patent to the basilar without stenosis. Basilar appears normal. Small PICA patent bilaterally. Superior cerebellar and posterior cerebral arteries are patent bilaterally. Posterior communicating artery patent bilaterally  Internal carotid artery widely patent bilaterally with mild atherosclerotic irregularity. Anterior and middle cerebral arteries widely patent bilaterally without stenosis  Negative for cerebral aneurysm.  IMPRESSION: Negative for acute infarct. Mild chronic white matter changes most likely related to microvascular ischemia  Negative MRA head.   Electronically Signed   By: Franchot Gallo M.D.   On: 07/30/2014 08:38   [name] Head/brain Wo Cm  07/30/2014   CLINICAL DATA:  TIA.  Speech abnormality.  Left-sided weakness.  EXAM: MRI HEAD WITHOUT CONTRAST  MRA HEAD WITHOUT CONTRAST  TECHNIQUE: Multiplanar, multiecho pulse sequences of the brain and surrounding structures were obtained without intravenous contrast. Angiographic images of the head were obtained using MRA technique without  contrast.  COMPARISON:  CT head 07/29/2014  FINDINGS: MRI HEAD FINDINGS  Negative for acute infarct.  Ventricle size is normal.  Cerebral volume is normal.  Scattered small hyperintensities in the subcortical and periventricular white matter bilaterally. 1 cm hyperintensity in the right frontal temporal white matter. Brainstem and cerebellum are normal.  Negative for hemorrhage or mass.  No edema.  Paranasal sinuses are clear.  MRA HEAD FINDINGS  Both vertebral arteries are patent to the basilar without stenosis. Basilar appears normal. Small PICA patent bilaterally. Superior cerebellar and posterior cerebral arteries are patent bilaterally. Posterior communicating artery patent bilaterally  Internal carotid artery widely patent bilaterally with mild atherosclerotic irregularity. Anterior and middle cerebral arteries widely patent bilaterally without stenosis  Negative for cerebral aneurysm.  IMPRESSION: Negative for acute infarct. Mild chronic white matter changes most likely related to microvascular ischemia  Negative MRA head.   Electronically Signed   By: Franchot Gallo M.D.   On: 07/30/2014 08:38  Carotid Doppler  There is 1-39% bilateral ICA stenosis. Vertebral artery flow is antegrade.    2D Echocardiogram  EF 55-60% with no source of embolus.    PHYSICAL EXAM Obese middle-aged African-American lady currently not in distress.Awake alert. Afebrile. Head is nontraumatic. Neck is supple without bruit. Hearing is normal. Cardiac exam no murmur or gallop. Lungs are clear to auscultation. Distal pulses are well felt. Neurological Exam ;  Awake  Alert oriented x 3. Normal speech and language.eye movements full without nystagmus.fundi were not visualized. Vision acuity and fields appear normal. Hearing is normal. Palatal movements are normal. Face symmetric. Tongue midline. Normal strength, tone, reflexes and coordination. Normal sensation. Gait deferred. ASSESSMENT/PLAN Sue [name] is a 63 y.o.  female with history of hypertension presenting with acute onset dysarthria and left upper extremity weakness. She did not receive IV t-PA due to mild symptoms.    Possible Right brainTIA due to small vessel disease in setting of hypoglycemia  Resultant  Neuro deficits have resolved  MRI  Unremarkable   MRA  Unremarkable   Carotid Doppler  No significant stenosis   2D Echo  No source of embolus   LDL 63  HgbA1c 6.1  Lovenox 40 mg sq daily for VTE prophylaxis  Diet Heart thin liquids  no antithrombotics prior to admission, now on aspirin 325 mg orally every day  Ongoing aggressive risk factor management  Therapy recommendations:  No therapy needs  Disposition:  Return home once testing completed  Okay for discharge from stroke standpoint  Stroke team will  Sign off  Follow up with neurology as needed  Hypertension  Stable  Other Stroke Risk Factors  Former Cigarette smoker, quit 2 years ago   ETOH use  Morbid Obesity, Body mass index is 51.6 kg/(m^2).   Other Active Problems  Hypoglycemia in the ED, treated with D50  Emphysema/COPD  GERD  Insomnia, uses Ativan at home for sleep  Other Pertinent History  Depression with anxiety  Hospital day # 1  [facility] [place] BIBY, MSN, APRN, ANVP-BC, AGPCNP-BC [facility] Pager: [phone] 07/30/2014 2:55 PM   I have personally examined this patient, reviewed notes, independently viewed imaging studies, participated in medical decision making and plan of care. I have made any additions or clarifications directly to the above note. Agree with note above. Suspect right brain TIA secondary to small vessel disease in the setting of hypoglycemia. Workup has been negative. Discharge home and follow-up as an outpatient in stroke clinic in 4 weeks  Antony Contras, [place] Pager: [phone] 07/30/2014 3:51 PM   To contact Stroke Continuity provider, please refer to  [email or web address] After hours, contact General Neurology

## 2014-07-30 NOTE — Progress Notes (Signed)
Reviewed all discharge documents with patient and daughter. Discussed the importance of stroke prevention strategies as outlined in the Stroke booklet and handouts. Patient feels that it is most important to lose weight and engage in an exercise program upon discharge. She was discharged to her home in [place] with transportation provided by daughter.

## 2014-07-30 NOTE — Progress Notes (Signed)
*  PRELIMINARY RESULTS* Vascular Ultrasound Carotid Duplex (Doppler) has been completed.  Preliminary findings: Technically difficult due to body habitus and depth of vessels. Bilateral:  1-39% ICA stenosis.  Vertebral artery flow is antegrade.      Landry Mellow, RDMS, RVT  07/30/2014, 10:04 AM

## 2014-07-30 NOTE — Progress Notes (Signed)
  Echocardiogram 2D Echocardiogram has been performed.  Shenee Wignall FRANCES 07/30/2014, 10:14 AM

## 2014-07-30 NOTE — Evaluation (Signed)
Physical Therapy Evaluation and Discharge Patient Details Name: Sue [name] MRN: [id number] DOB: [birth year] Today's Date: 07/30/2014   History of Present Illness   63 y.o. female admitted from home by EMS for L UE weakness and slurred speech. Blood sugar in ED found to be 58. Given D50 in ED. CT (-) MRI (-)PMH: COPD wears oxygen at baseline, incontinence of bladder and HTN   Clinical Impression  Pt adm due to above. Pt appears to be at baseline from mobility standpoint. Pt deconditioned and poor activity tolerance but reports this is her baseline and she does not ambulate much at home (other than to household distances). Pt does still c/o intermittent dysarthric speech and difficulties finding words; no speech deficits noted during session. No further acute PT needs warranted. Will sign off at this time.     Follow Up Recommendations No PT follow up;Supervision - Intermittent    Equipment Recommendations  None recommended by PT    Recommendations for Other Services OT consult     Precautions / Restrictions Precautions Precaution Comments: 2L oxygen required at baseline Restrictions Weight Bearing Restrictions: No      Mobility  Bed Mobility Overal bed mobility: Modified Independent             General bed mobility comments: incr time and effortful but no physical (A) needed  Transfers Overall transfer level: Modified independent Equipment used: None             General transfer comment: no sway or LOB noted with transfer  Ambulation/Gait Ambulation/Gait assistance: Modified independent (Device/Increase time) Ambulation Distance (Feet): 90 Feet Assistive device: None Gait Pattern/deviations: Step-through pattern;Decreased stride length;Wide base of support (externally rotates bil LEs ) Gait velocity: decreased Gait velocity interpretation: Below normal speed for age/gender General Gait Details: performed high level balance activities; no LOB noted; pt with  short waddle like gt but this is her baseline per pt; no over LOB noted with gt challenges   Stairs            Wheelchair Mobility    Modified Rankin (Stroke Patients Only) Modified Rankin (Stroke Patients Only) Pre-Morbid Rankin Score: No symptoms Modified Rankin: No significant disability     Balance Overall balance assessment: No apparent balance deficits (not formally assessed);Modified Independent Sitting-balance support: Feet supported;No upper extremity supported Sitting balance-Leahy Scale: Good Sitting balance - Comments: denied any dizziness   Standing balance support: During functional activity;No upper extremity supported Standing balance-Leahy Scale: Good Standing balance comment: at baseline              High level balance activites: Head turns;Direction changes;Sudden stops;Turns;Backward walking High Level Balance Comments: no LOB noted; no c/o dizziness             Pertinent Vitals/Pain Pain Assessment: No/denies pain    Home Living Family/patient expects to be discharged to:: Private residence Living Arrangements: Children;Other (Comment);Other relatives Available Help at Discharge: Family;Available 24 hours/day Type of Home: Apartment Home Access: Level entry     Home Layout: One level Home Equipment: None      Prior Function Level of Independence: Independent         Comments: pt ambulates short distances only due to decr activity tolerance; rides scooter at grocery store     Hand Dominance        Extremity/Trunk Assessment   Upper Extremity Assessment: Defer to OT evaluation           Lower Extremity Assessment: Generalized weakness (at her baseline )  Cervical / Trunk Assessment: Normal  Communication   Communication: No difficulties;Other (comment) (pt does c/o about some dysartrhic speech intermittently)  Cognition Arousal/Alertness: Awake/alert Behavior During Therapy: WFL for tasks  assessed/performed Overall Cognitive Status: Within Functional Limits for tasks assessed                      General Comments General comments (skin integrity, edema, etc.): discussed importance of early signs and symptoms of stroke; pt denying any changes in vision; wears glasses 24/7 at baseline     Exercises        Assessment/Plan    PT Assessment Patent does not need any further PT services  PT Diagnosis Difficulty walking;Generalized weakness   PT Problem List    PT Treatment Interventions     PT Goals (Current goals can be found in the Care Plan section) Acute Rehab PT Goals Patient Stated Goal: to go home  PT Goal Formulation: All assessment and education complete, DC therapy    Frequency     Barriers to discharge        Co-evaluation               End of Session Equipment Utilized During Treatment: Gait belt;Oxygen Activity Tolerance: Patient tolerated treatment well Patient left: in bed;with call bell/phone within reach;Other (comment) (transporter present) Nurse Communication: Mobility status    Functional Assessment Tool Used: clinical judgement Functional Limitation: Mobility: Walking and moving around Mobility: Walking and Moving Around Current Status [phone]): 0 percent impaired, limited or restricted Mobility: Walking and Moving Around Goal Status [phone]): 0 percent impaired, limited or restricted Mobility: Walking and Moving Around Discharge Status [phone]): 0 percent impaired, limited or restricted    Time: [id number] PT Time Calculation (min) (ACUTE ONLY): 18 min   Charges:   PT Evaluation $Initial PT Evaluation Tier I: 1 Procedure PT Treatments $Gait Training: 8-22 mins   PT G Codes:   Functional Assessment Tool Used: clinical judgement Functional Limitation: Mobility: Walking and moving around    [place], [place], [place]  [phone] 07/30/2014, 10:22 AM

## 2014-08-01 ENCOUNTER — Ambulatory Visit: Payer: Commercial Managed Care - HMO | Admitting: Family Medicine

## 2014-08-05 ENCOUNTER — Ambulatory Visit: Payer: Commercial Managed Care - HMO | Admitting: Family Medicine

## 2014-08-14 ENCOUNTER — Telehealth: Payer: Self-pay | Admitting: *Deleted

## 2014-08-14 ENCOUNTER — Ambulatory Visit: Payer: Commercial Managed Care - HMO | Admitting: Family Medicine

## 2014-08-14 NOTE — Telephone Encounter (Signed)
Pt called day of her original appointment (08/14/2014 - 11:30am) at 10:41am and rescheduled her appointment for 08/21/2014 at 10:00am for hospital follow up.

## 2014-08-14 NOTE — Telephone Encounter (Signed)
See note about charge below

## 2014-08-14 NOTE — Telephone Encounter (Signed)
Ok -- just saw your note.  Make note if she does this again we will charge

## 2014-08-21 ENCOUNTER — Encounter: Payer: Self-pay | Admitting: Family Medicine

## 2014-08-21 ENCOUNTER — Ambulatory Visit (INDEPENDENT_AMBULATORY_CARE_PROVIDER_SITE_OTHER): Payer: Commercial Managed Care - HMO | Admitting: Family Medicine

## 2014-08-21 VITALS — BP 104/68 | HR 86 | Temp 98.5°F | Ht 63.0 in | Wt 297.0 lb

## 2014-08-21 DIAGNOSIS — R413 Other amnesia: Secondary | ICD-10-CM

## 2014-08-21 DIAGNOSIS — J9611 Chronic respiratory failure with hypoxia: Secondary | ICD-10-CM

## 2014-08-21 DIAGNOSIS — G459 Transient cerebral ischemic attack, unspecified: Secondary | ICD-10-CM

## 2014-08-21 DIAGNOSIS — R609 Edema, unspecified: Secondary | ICD-10-CM

## 2014-08-21 DIAGNOSIS — F411 Generalized anxiety disorder: Secondary | ICD-10-CM

## 2014-08-21 DIAGNOSIS — R531 Weakness: Secondary | ICD-10-CM

## 2014-08-21 DIAGNOSIS — I10 Essential (primary) hypertension: Secondary | ICD-10-CM

## 2014-08-21 DIAGNOSIS — G4700 Insomnia, unspecified: Secondary | ICD-10-CM

## 2014-08-21 DIAGNOSIS — Z23 Encounter for immunization: Secondary | ICD-10-CM

## 2014-08-21 LAB — LIPID PANEL
Cholesterol: 119 mg/dL (ref 0–200)
HDL: 26.1 mg/dL — AB (ref >39.00)
LDL Cholesterol: 73 mg/dL (ref 0–99)
NONHDL: 92.9
Total CHOL/HDL Ratio: 5
Triglycerides: 98 mg/dL (ref 0.0–149.0)
VLDL: 19.6 mg/dL (ref 0.0–40.0)

## 2014-08-21 LAB — CBC WITH DIFFERENTIAL/PLATELET
BASOS ABS: 0 10*3/uL (ref 0.0–0.1)
Basophils Relative: 0.4 % (ref 0.0–3.0)
EOS ABS: 0.2 10*3/uL (ref 0.0–0.7)
EOS PCT: 3 % (ref 0.0–5.0)
HCT: 43.1 % (ref 36.0–46.0)
Hemoglobin: 14.1 g/dL (ref 12.0–15.0)
Lymphocytes Relative: 48.8 % — ABNORMAL HIGH (ref 12.0–46.0)
Lymphs Abs: 3.2 10*3/uL (ref 0.7–4.0)
MCHC: 32.8 g/dL (ref 30.0–36.0)
MCV: 98.6 fl (ref 78.0–100.0)
MONO ABS: 0.4 10*3/uL (ref 0.1–1.0)
Monocytes Relative: 6.8 % (ref 3.0–12.0)
NEUTROS PCT: 41 % — AB (ref 43.0–77.0)
Neutro Abs: 2.7 10*3/uL (ref 1.4–7.7)
PLATELETS: 180 10*3/uL (ref 150.0–400.0)
RBC: 4.37 Mil/uL (ref 3.87–5.11)
RDW: 13.8 % (ref 11.5–15.5)
WBC: 6.5 10*3/uL (ref 4.0–10.5)

## 2014-08-21 LAB — BASIC METABOLIC PANEL
BUN: 11 mg/dL (ref 6–23)
CHLORIDE: 102 meq/L (ref 96–112)
CO2: 29 mEq/L (ref 19–32)
Calcium: 9.3 mg/dL (ref 8.4–10.5)
Creatinine, Ser: 1.1 mg/dL (ref 0.4–1.2)
GFR: 64.71 mL/min (ref >60.00)
Glucose, Bld: 103 mg/dL — ABNORMAL HIGH (ref 70–99)
Potassium: 4 mEq/L (ref 3.5–5.1)
Sodium: 137 mEq/L (ref 135–145)

## 2014-08-21 LAB — VITAMIN B12: VITAMIN B 12: 556 pg/mL (ref 211–911)

## 2014-08-21 LAB — TSH: TSH: 8.3 u[IU]/mL — ABNORMAL HIGH (ref 0.35–4.50)

## 2014-08-21 MED ORDER — ALPRAZOLAM 0.5 MG PO TABS: 0.5000 mg | ORAL_TABLET | Freq: Two times a day (BID) | ORAL | Status: DC | PRN

## 2014-08-21 MED ORDER — LOSARTAN POTASSIUM-HCTZ 100-25 MG PO TABS: 1.0000 | ORAL_TABLET | Freq: Every day | ORAL | Status: DC

## 2014-08-21 MED ORDER — FUROSEMIDE 20 MG PO TABS: 20.0000 mg | ORAL_TABLET | Freq: Every day | ORAL | Status: DC

## 2014-08-21 MED ORDER — METOPROLOL SUCCINATE ER 50 MG PO TB24: 100.0000 mg | ORAL_TABLET | Freq: Every morning | ORAL | Status: DC

## 2014-08-21 MED ORDER — ZOLPIDEM TARTRATE 10 MG PO TABS: 10.0000 mg | ORAL_TABLET | Freq: Every day | ORAL | Status: DC

## 2014-08-21 MED ORDER — LOSARTAN POTASSIUM 100 MG PO TABS: 100.0000 mg | ORAL_TABLET | Freq: Every day | ORAL | Status: DC

## 2014-08-21 NOTE — Patient Instructions (Signed)
Transient Ischemic Attack  A transient ischemic attack (TIA) is a "warning stroke" that causes stroke-like symptoms. Unlike a stroke, a TIA does not cause permanent damage to the brain. The symptoms of a TIA can happen very fast and do not last long. It is important to know the symptoms of a TIA and what to do. This can help prevent a major stroke or death.  CAUSES   · A TIA is caused by a temporary blockage in an artery in the brain or neck (carotid artery). The blockage does not allow the brain to get the blood supply it needs and can cause different symptoms. The blockage can be caused by either:  ¨ A blood clot.  ¨ Fatty buildup (plaque) in a neck or brain artery.  RISK FACTORS  · High blood pressure (hypertension).  · High cholesterol.  · Diabetes mellitus.  · Heart disease.  · The build up of plaque in the blood vessels (peripheral artery disease or atherosclerosis).  · The build up of plaque in the blood vessels providing blood and oxygen to the brain (carotid artery stenosis).  · An abnormal heart rhythm (atrial fibrillation).  · Obesity.  · Smoking.  · Taking oral contraceptives (especially in combination with smoking).  · Physical inactivity.  · A diet high in fats, salt (sodium), and calories.  · Alcohol use.  · Use of illegal drugs (especially cocaine and methamphetamine).  · Being female.  · Being African American.  · Being over the age of 55.  · Family history of stroke.  · Previous history of blood clots, stroke, TIA, or heart attack.  · Sickle cell disease.  SYMPTOMS   TIA symptoms are the same as a stroke but are temporary. These symptoms usually develop suddenly, or may be newly present upon awakening from sleep:  · Sudden weakness or numbness of the face, arm, or leg, especially on one side of the body.  · Sudden trouble walking or difficulty moving arms or legs.  · Sudden confusion.  · Sudden personality changes.  · Trouble speaking (aphasia) or understanding.  · Difficulty swallowing.  · Sudden  trouble seeing in one or both eyes.  · Double vision.  · Dizziness.  · Loss of balance or coordination.  · Sudden severe headache with no known cause.  · Trouble reading or writing.  · Loss of bowel or bladder control.  · Loss of consciousness.  DIAGNOSIS   Your caregiver may be able to determine the presence or absence of a TIA based on your symptoms, history, and physical exam. Computed tomography (CT scan) of the brain is usually performed to help identify a TIA. Other tests may be done to diagnose a TIA. These tests may include:  · Electrocardiography.  · Continuous heart monitoring.  · Echocardiography.  · Carotid ultrasonography.  · Magnetic resonance imaging (MRI).  · A scan of the brain circulation.  · Blood tests.  PREVENTION   The risk of a TIA can be decreased by appropriately treating high blood pressure, high cholesterol, diabetes, heart disease, and obesity and by quitting smoking, limiting alcohol, and staying physically active.  TREATMENT   Time is of the essence. Since the symptoms of TIA are the same as a stroke, it is important to seek treatment as soon as possible because you may need a medicine to dissolve the clot (thrombolytic) that cannot be given if too much time has passed. Treatment options vary. Treatment options may include rest, oxygen, intravenous (IV) fluids,   and medicines to thin the blood (anticoagulants). Medicines and diet may be used to address diabetes, high blood pressure, and other risk factors. Measures will be taken to prevent short-term and long-term complications, including infection from breathing foreign material into the lungs (aspiration pneumonia), blood clots in the legs, and falls. Treatment options include procedures to either remove plaque in the carotid arteries or dilate carotid arteries that have narrowed due to plaque. Those procedures are:  · Carotid endarterectomy.  · Carotid angioplasty and stenting.  HOME CARE INSTRUCTIONS   · Take all medicines prescribed  by your caregiver. Follow the directions carefully. Medicines may be used to control risk factors for a stroke. Be sure you understand all your medicine instructions.  · You may be told to take aspirin or the anticoagulant warfarin. Warfarin needs to be taken exactly as instructed.  ¨ Taking too much or too little warfarin is dangerous. Too much warfarin increases the risk of bleeding. Too little warfarin continues to allow the risk for blood clots. While taking warfarin, you will need to have regular blood tests to measure your blood clotting time. A PT blood test measures how long it takes for blood to clot. Your PT is used to calculate another value called an INR. Your PT and INR help your caregiver to adjust your dose of warfarin. The dose can change for many reasons. It is critically important that you take warfarin exactly as prescribed.  ¨ Many foods, especially foods high in vitamin K can interfere with warfarin and affect the PT and INR. Foods high in vitamin K include spinach, kale, broccoli, cabbage, collard and turnip greens, brussels sprouts, peas, cauliflower, seaweed, and parsley as well as beef and pork liver, green tea, and soybean oil. You should eat a consistent amount of foods high in vitamin K. Avoid major changes in your diet, or notify your caregiver before changing your diet. Arrange a visit with a dietitian to answer your questions.  ¨ Many medicines can interfere with warfarin and affect the PT and INR. You must tell your caregiver about any and all medicines you take, this includes all vitamins and supplements. Be especially cautious with aspirin and anti-inflammatory medicines. Do not take or discontinue any prescribed or over-the-counter medicine except on the advice of your caregiver or pharmacist.  ¨ Warfarin can have side effects, such as excessive bruising or bleeding. You will need to hold pressure over cuts for longer than usual. Your caregiver or pharmacist will discuss other  potential side effects.  ¨ Avoid sports or activities that may cause injury or bleeding.  ¨ Be mindful when shaving, flossing your teeth, or handling sharp objects.  ¨ Alcohol can change the body's ability to handle warfarin. It is best to avoid alcoholic drinks or consume only very small amounts while taking warfarin. Notify your caregiver if you change your alcohol intake.  ¨ Notify your dentist or other caregivers before procedures.  · Eat a diet that includes 5 or more servings of fruits and vegetables each day. This may reduce the risk of stroke. Certain diets may be prescribed to address high blood pressure, high cholesterol, diabetes, or obesity.  ¨ A low-sodium, low-saturated fat, low-trans fat, low-cholesterol diet is recommended to manage high blood pressure.  ¨ A low-saturated fat, low-trans fat, low-cholesterol, and high-fiber diet may control cholesterol levels.  ¨ A controlled-carbohydrate, controlled-sugar diet is recommended to manage diabetes.  ¨ A reduced-calorie, low-sodium, low-saturated fat, low-trans fat, low-cholesterol diet is recommended to manage obesity.  ·   Maintain a healthy weight.  · Stay physically active. It is recommended that you get at least 30 minutes of activity on most or all days.  · Do not smoke.  · Limit alcohol use even if you are not taking warfarin. Moderate alcohol use is considered to be:  ¨ No more than 2 drinks each day for men.  ¨ No more than 1 drink each day for nonpregnant women.  · Stop drug abuse.  · Home safety. A safe home environment is important to reduce the risk of falls. Your caregiver may arrange for specialists to evaluate your home. Having grab bars in the bedroom and bathroom is often important. Your caregiver may arrange for equipment to be used at home, such as raised toilets and a seat for the shower.  · Follow all instructions for follow-up with your caregiver. This is very important. This includes any referrals and lab tests. Proper follow up can  prevent a stroke or another TIA from occurring.  SEEK MEDICAL CARE IF:  · You have personality changes.  · You have difficulty swallowing.  · You are seeing double.  · You have dizziness.  · You have a fever.  · You have skin breakdown.  SEEK IMMEDIATE MEDICAL CARE IF:   Any of these symptoms may represent a serious problem that is an emergency. Do not wait to see if the symptoms will go away. Get medical help right away. Call your local emergency services (911 in [place] Do not drive yourself to the hospital.  · You have sudden weakness or numbness of the face, arm, or leg, especially on one side of the body.  · You have sudden trouble walking or difficulty moving arms or legs.  · You have sudden confusion.  · You have trouble speaking (aphasia) or understanding.  · You have sudden trouble seeing in one or both eyes.  · You have a loss of balance or coordination.  · You have a sudden, severe headache with no known cause.  · You have new chest pain or an irregular heartbeat.  · You have a partial or total loss of consciousness.  MAKE SURE YOU:   · Understand these instructions.  · Will watch your condition.  · Will get help right away if you are not doing well or get worse.  Document Released: 06/08/2005 Document Revised: 09/03/2013 Document Reviewed: 12/04/2013  Exit[employer]® Patient Information ©2015 [employer]. This information is not intended to replace advice given to you by your health care provider. Make sure you discuss any questions you have with your health care provider.

## 2014-08-21 NOTE — Progress Notes (Signed)
Subjective    Patient ID: Sue [name], female    DOB: Aug 10, 1951, 63 y.o.   tMRN: [id number]  HPI  Pt here to f/u from hospital ---possible TIA and hypoglycemia.  EMS was called because pt passed out for a few seconds.  EmS was called and BS was in 50s--- pt was slurring speech and ? L upper ext weakness.  They gave her glucose in ambulance but by the time she got to the hospital she was feeling better.   CT/ MRI/ MRA  All neg for acute event.  By the next day she was almost back to herself.  She feels about 85 % back to herself.  She feels like she is being forgetful and memory is worse.    Past Medical History  Diagnosis Date  . Hypertension   . Emphysema/COPD   . Depression   . GERD (gastroesophageal reflux disease)   . Urine incontinence    History  Substance Use Topics  . Smoking status: Former Smoker -- 1.00 packs/day for 40 years    Types: Cigarettes    Quit date: 10/16/2011  . Smokeless tobacco: Never Used  . Alcohol Use: Yes     Comment: Occ-- Wine   History   Social History  . Marital Status: Single    Spouse Name: N/A    Number of Children: N/A  . Years of Education: N/A   Occupational History  . disabled- Occupational hygienist Med    Social History Main Topics  . Smoking status: Former Smoker -- 1.00 packs/day for 40 years    Types: Cigarettes    Quit date: 10/16/2011  . Smokeless tobacco: Never Used  . Alcohol Use: Yes     Comment: Occ-- Wine  . Drug Use: No  . Sexual Activity: Not on file   Other Topics Concern  . Not on file   Social History Narrative   Family History  Problem Relation Age of Onset  . Heart disease Father     MVP and Pics Valve  . Hypertension Father   . Depression Father     Institutionalized x's 2 years  . Bipolar disorder Father   . Heart disease Paternal Grandmother   . Heart disease Paternal Aunt   . Heart disease Paternal Uncle   . Schizophrenia Paternal Aunt   . Hypertension Sister   . Diabetes Sister   . Hyperlipidemia  Sister   . Heart disease Sister 29    MI  . Heart disease Brother   . Hypertension Brother    Current Outpatient Prescriptions  Medication Sig Dispense Refill  . albuterol (PROVENTIL HFA;VENTOLIN HFA) 108 (90 BASE) MCG/ACT inhaler Inhale 2 puffs into the lungs every 6 (six) hours as needed for wheezing or shortness of breath. 1 Inhaler 0  . ALPRAZolam (XANAX) 0.5 MG tablet Take 1 tablet (0.5 mg total) by mouth 2 (two) times daily as needed for anxiety. 60 tablet 0  . aspirin EC 325 MG EC tablet Take 1 tablet (325 mg total) by mouth daily. 60 tablet 0  . LORazepam (ATIVAN) 2 MG tablet Take 2 mg by mouth at bedtime.    Marland Kitchen losartan-hydrochlorothiazide (HYZAAR) 100-25 MG per tablet Take 1 tablet by mouth daily. 90 tablet 1  . metoprolol succinate (TOPROL-XL) 50 MG 24 hr tablet Take 2 tablets (100 mg total) by mouth every morning. Take with or immediately following a meal. 180 tablet 1  . OXYGEN Inhale 2 L into the lungs continuous.    Marland Kitchen  Potassium Chloride ER 20 MEQ TBCR Take 20 mEq by mouth daily. 30 tablet 5  . zolpidem (AMBIEN) 10 MG tablet Take 1 tablet (10 mg total) by mouth at bedtime. 30 tablet 1   No current facility-administered medications for this visit.     Review of Systems As above    Objective:   Physical Exam BP 104/68 mmHg  Pulse 86  Temp(Src) 98.5 F (36.9 C) (Oral)  Ht 5\' 3"  (1.6 m)  Wt 297 lb (134.718 kg)  BMI 52.62 kg/m2  SpO2 97% General appearance: alert, cooperative, appears stated age and no distress Nose: Nares normal. Septum midline. Mucosa normal. No drainage or sinus tenderness. Throat: lips, mucosa, and tongue normal; teeth and gums normal Neck: no adenopathy, supple, symmetrical, trachea midline and thyroid not enlarged, symmetric, no tenderness/mass/nodules Lungs: clear to auscultation bilaterally--- on O2 Heart: S1, S2 normal Extremities: extremities normal, atraumatic, no cyanosis or edema       Assessment & Plan:  1. Need for prophylactic  vaccination and inoculation against influenza   - Flu Vaccine QUAD 36+ mos PF IM (Fluarix Quad PF)  2. Essential hypertension checkc labs, con't meds - Ambulatory referral to [place] metabolic panel - Lipid panel  3. Anxiety state  - ALPRAZolam (XANAX) 0.5 MG tablet; Take 1 tablet (0.5 mg total) by mouth 2 (two) times daily as needed for anxiety.  Dispense: 60 tablet; Refill: 0  4. Chronic respiratory failure with hypoxia  - Ambulatory referral to Home Health  5. Transient cerebral ischemia, unspecified transient cerebral ischemia type Improved, f/u neuro - Ambulatory referral to [place] - Ambulatory referral to Neurology  6. Weakness  - Ambulatory referral to [place] - Ambulatory referral to Neurology - Basic metabolic panel - CBC with Differential - TSH - Vitamin B12  7. Insomnia  - zolpidem (AMBIEN) 10 MG tablet; Take 1 tablet (10 mg total) by mouth at bedtime.  Dispense: 30 tablet; Refill: 1  8. Memory loss  - Ambulatory referral to Neurology - Basic metabolic panel - CBC with Differential - TSH - Vitamin B12  9. Edema D/c hctz and start lasix - furosemide (LASIX) 20 MG tablet; Take 1 tablet (20 mg total) by mouth daily.  Dispense: 30 tablet; Refill: 3

## 2014-08-21 NOTE — Progress Notes (Signed)
Pre visit review using our clinic review tool, if applicable. No additional management support is needed unless otherwise documented below in the visit note. 

## 2014-08-29 ENCOUNTER — Telehealth: Payer: Self-pay

## 2014-08-29 DIAGNOSIS — E039 Hypothyroidism, unspecified: Secondary | ICD-10-CM

## 2014-08-29 NOTE — Telephone Encounter (Signed)
Patient has been made aware. Apt scheduled for Monday.      KP

## 2014-08-29 NOTE — Telephone Encounter (Signed)
-----   Message from Rosalita Chessman, DO sent at 08/27/2014  4:30 PM EST ----- Hypothyroid--- recheck TSH free T3, free t4  ---- dx hypothyroid

## 2014-09-01 ENCOUNTER — Other Ambulatory Visit: Payer: Commercial Managed Care - HMO

## 2014-09-02 ENCOUNTER — Other Ambulatory Visit (INDEPENDENT_AMBULATORY_CARE_PROVIDER_SITE_OTHER): Payer: Commercial Managed Care - HMO

## 2014-09-02 DIAGNOSIS — E039 Hypothyroidism, unspecified: Secondary | ICD-10-CM

## 2014-09-02 LAB — T3, FREE: T3 FREE: 4 pg/mL (ref 2.3–4.2)

## 2014-09-02 LAB — TSH: TSH: 6.69 u[IU]/mL — AB (ref 0.35–4.50)

## 2014-09-02 LAB — T4, FREE: Free T4: 0.84 ng/dL (ref 0.60–1.60)

## 2014-09-12 DIAGNOSIS — I639 Cerebral infarction, unspecified: Secondary | ICD-10-CM

## 2014-09-12 HISTORY — DX: Cerebral infarction, unspecified: I63.9

## 2014-09-15 ENCOUNTER — Other Ambulatory Visit: Payer: Self-pay

## 2014-09-15 MED ORDER — LEVOTHYROXINE SODIUM 25 MCG PO TABS: 25.0000 ug | ORAL_TABLET | Freq: Every day | ORAL | Status: DC

## 2014-09-17 DIAGNOSIS — J449 Chronic obstructive pulmonary disease, unspecified: Secondary | ICD-10-CM | POA: Diagnosis not present

## 2014-09-17 DIAGNOSIS — J9601 Acute respiratory failure with hypoxia: Secondary | ICD-10-CM | POA: Diagnosis not present

## 2014-09-29 ENCOUNTER — Telehealth: Payer: Self-pay | Admitting: Neurology

## 2014-09-29 NOTE — Telephone Encounter (Signed)
Pt called and canceled new patient appt for 09-30-14 notified referring dr

## 2014-09-30 ENCOUNTER — Ambulatory Visit: Payer: Commercial Managed Care - HMO | Admitting: Neurology

## 2014-10-12 DIAGNOSIS — J449 Chronic obstructive pulmonary disease, unspecified: Secondary | ICD-10-CM | POA: Diagnosis not present

## 2014-10-12 DIAGNOSIS — J9601 Acute respiratory failure with hypoxia: Secondary | ICD-10-CM | POA: Diagnosis not present

## 2014-11-10 DIAGNOSIS — J9601 Acute respiratory failure with hypoxia: Secondary | ICD-10-CM | POA: Diagnosis not present

## 2014-11-10 DIAGNOSIS — J449 Chronic obstructive pulmonary disease, unspecified: Secondary | ICD-10-CM | POA: Diagnosis not present

## 2014-11-27 ENCOUNTER — Other Ambulatory Visit: Payer: Self-pay | Admitting: Family Medicine

## 2014-12-11 DIAGNOSIS — J449 Chronic obstructive pulmonary disease, unspecified: Secondary | ICD-10-CM | POA: Diagnosis not present

## 2014-12-11 DIAGNOSIS — J9601 Acute respiratory failure with hypoxia: Secondary | ICD-10-CM | POA: Diagnosis not present

## 2014-12-23 ENCOUNTER — Telehealth: Payer: Self-pay | Admitting: Family Medicine

## 2014-12-23 NOTE — Telephone Encounter (Signed)
Pt has appt with Mackie Pai on 12/24/14 at 11:15AM.

## 2014-12-23 NOTE — Telephone Encounter (Signed)
[facility] - Client [facility] Patient Name: Sue [name] DOB: 1951/08/20 Initial Comment caller states both of her legs are swollen and the right one yesterday was warm to the touch Nurse Assessment Nurse: Markus Daft, RN, Sherre Poot Date/Time (Eastern Time): 12/23/2014 9:40:01 AM Confirm and document reason for call. If symptomatic, describe symptoms. ---Caller states both of her legs are swollen from feet up to her thigh (right > left), and the right thigh yesterday was warm to the touch, but warm now. Right leg is w/ pain shooting from foot up to the thigh. Ongoing for a month. Has SOB with few steps ongoing for year now, wearing O2. Has the patient traveled out of the country within the last 30 days? ---Not Applicable Does the patient require triage? ---Yes Related visit to physician within the last 2 weeks? ---No Does the PT have any chronic conditions? (i.e. diabetes, asthma, etc.) ---Yes List chronic conditions. ---Emphysema/COPD and wears O2 cont. at 2 L/[place]; h/o smoking for 30+ years. Obesity. Guidelines Guideline Title Affirmed Question Affirmed Notes Leg Swelling and Edema SEVERE leg swelling (e.g., swelling extends above knee, entire leg is swollen, weeping fluid) Final Disposition User See Physician within 4 Hours (or PCP triage) Markus Daft, RN, Windy Comments Appt. made for tomorrow at 11:15 am with PA Saguier. -- Caller states that she does not have tranporation for today to make to office, but if she worsens before tomorrow she knows to go on to ER or UC and that nurses advice is not to wait.

## 2014-12-24 ENCOUNTER — Ambulatory Visit (INDEPENDENT_AMBULATORY_CARE_PROVIDER_SITE_OTHER): Payer: Commercial Managed Care - HMO | Admitting: Medical

## 2014-12-24 ENCOUNTER — Ambulatory Visit (HOSPITAL_BASED_OUTPATIENT_CLINIC_OR_DEPARTMENT_OTHER)
Admission: RE | Admit: 2014-12-24 | Discharge: 2014-12-24 | Disposition: A | Discharge status: Home / Self Care | Payer: Commercial Managed Care - HMO | Source: Ambulatory Visit | Attending: Medical | Admitting: Medical

## 2014-12-24 ENCOUNTER — Encounter: Payer: Self-pay | Admitting: Medical

## 2014-12-24 VITALS — BP 142/86 | HR 82 | Temp 97.7°F | Ht 63.0 in | Wt 302.4 lb

## 2014-12-24 DIAGNOSIS — R0600 Dyspnea, unspecified: Secondary | ICD-10-CM

## 2014-12-24 DIAGNOSIS — R0602 Shortness of breath: Secondary | ICD-10-CM | POA: Diagnosis not present

## 2014-12-24 DIAGNOSIS — R635 Abnormal weight gain: Secondary | ICD-10-CM

## 2014-12-24 DIAGNOSIS — R600 Localized edema: Secondary | ICD-10-CM

## 2014-12-24 DIAGNOSIS — E038 Other specified hypothyroidism: Secondary | ICD-10-CM | POA: Diagnosis not present

## 2014-12-24 DIAGNOSIS — M25471 Effusion, right ankle: Secondary | ICD-10-CM | POA: Insufficient documentation

## 2014-12-24 LAB — COMPREHENSIVE METABOLIC PANEL
ALT: 26 U/L (ref 0–35)
AST: 55 U/L — ABNORMAL HIGH (ref 0–37)
Albumin: 3.3 g/dL — ABNORMAL LOW (ref 3.5–5.2)
Alkaline Phosphatase: 133 U/L — ABNORMAL HIGH (ref 39–117)
BUN: 7 mg/dL (ref 6–23)
CALCIUM: 9.5 mg/dL (ref 8.4–10.5)
CHLORIDE: 102 meq/L (ref 96–112)
CO2: 29 meq/L (ref 19–32)
CREATININE: 0.93 mg/dL (ref 0.40–1.20)
GFR: 78.45 mL/min (ref >60.00)
GLUCOSE: 110 mg/dL — AB (ref 70–99)
Potassium: 3.6 mEq/L (ref 3.5–5.1)
Sodium: 135 mEq/L (ref 135–145)
Total Bilirubin: 0.9 mg/dL (ref 0.2–1.2)
Total Protein: 8.8 g/dL — ABNORMAL HIGH (ref 6.0–8.3)

## 2014-12-24 LAB — BRAIN NATRIURETIC PEPTIDE: PRO B NATRI PEPTIDE: 34 pg/mL (ref 0.0–100.0)

## 2014-12-24 NOTE — Progress Notes (Signed)
Pre visit review using our clinic review tool, if applicable. No additional management support is needed unless otherwise documented below in the visit note. 

## 2014-12-24 NOTE — Progress Notes (Signed)
Subjective:    Patient ID: Sue [name], female    DOB: June 03, 1951, 64 y.o.   MRN: [id number]  HPI  Pt in with some leg and feet swelling. This is the case on both sides. Hx of this in the past. Past week has been worse. Moderate pain anterior tibia area with occasional pain shooting up toward her thighs. But today she has a good day not any pain. In the past when she would lie down/sleep and  her edema would be much better in the am. But past week when wake up in am swelling not down. No known hx of chf. No recent chest pain.  About 1 yr ago normal bnp, cxr normal and echo looked good.  Pt weight about 3 lb increase over last year.  Pt is on o2 daily 3 liters.  Pt is on lasix 20 mg one time a day.  Also hx of hypothyoroid and no recent checks per pt.    Review of Systems  Constitutional: Negative for fever, chills and fatigue.  HENT: Negative.   Respiratory: Negative for cough, chest tightness and wheezing.        Only faint mild worse breathing than usual. /Incrimental worsening.  Cardiovascular: Positive for leg swelling. Negative for chest pain.  Gastrointestinal: Negative for abdominal pain.  Musculoskeletal: Negative for back pain.  Neurological: Negative for dizziness, seizures, numbness and headaches.  Hematological: Negative for adenopathy. Does not bruise/bleed easily.  Psychiatric/Behavioral: Negative for behavioral problems and confusion.    Past Medical History  Diagnosis Date  . Hypertension   . Emphysema/COPD   . Depression   . GERD (gastroesophageal reflux disease)   . Urine incontinence     History   Social History  . Marital Status: Single    Spouse Name: N/A  . Number of Children: N/A  . Years of Education: N/A   Occupational History  . disabled- Occupational hygienist Med    Social History Main Topics  . Smoking status: Former Smoker -- 1.00 packs/day for 40 years    Types: Cigarettes    Quit date: 10/16/2011  . Smokeless tobacco: Never Used  .  Alcohol Use: Yes     Comment: Occ-- Wine  . Drug Use: No  . Sexual Activity: Not on file   Other Topics Concern  . Not on file   Social History Narrative    Past Surgical History  Procedure Laterality Date  . Abdominal hysterectomy    . Cesarean section      Family History  Problem Relation Age of Onset  . Heart disease Father     MVP and Pics Valve  . Hypertension Father   . Depression Father     Institutionalized x's 2 years  . Bipolar disorder Father   . Heart disease Paternal Grandmother   . Heart disease Paternal Aunt   . Heart disease Paternal Uncle   . Schizophrenia Paternal Aunt   . Hypertension Sister   . Diabetes Sister   . Hyperlipidemia Sister   . Heart disease Sister 74    MI  . Heart disease Brother   . Hypertension Brother     No Known Allergies  Current Outpatient Prescriptions on File Prior to Visit  Medication Sig Dispense Refill  . albuterol (PROVENTIL HFA;VENTOLIN HFA) 108 (90 BASE) MCG/ACT inhaler Inhale 2 puffs into the lungs every 6 (six) hours as needed for wheezing or shortness of breath. 1 Inhaler 0  . ALPRAZolam (XANAX) 0.5 MG tablet Take 1 tablet (  0.5 mg total) by mouth 2 (two) times daily as needed for anxiety. 60 tablet 0  . aspirin EC 325 MG EC tablet Take 1 tablet (325 mg total) by mouth daily. 60 tablet 0  . furosemide (LASIX) 20 MG tablet Take 1 tablet (20 mg total) by mouth daily. 30 tablet 3  . levothyroxine (SYNTHROID, LEVOTHROID) 25 MCG tablet Take 1 tablet (25 mcg total) by mouth daily before breakfast. REPEAT LABS ARE DUE NOW 30 tablet 0  . LORazepam (ATIVAN) 2 MG tablet Take 2 mg by mouth at bedtime.    Marland Kitchen losartan (COZAAR) 100 MG tablet Take 1 tablet (100 mg total) by mouth daily. 90 tablet 3  . metoprolol succinate (TOPROL-XL) 50 MG 24 hr tablet Take 2 tablets (100 mg total) by mouth every morning. Take with or immediately following a meal. 180 tablet 1  . OXYGEN Inhale 2 L into the lungs continuous.    . Potassium Chloride  ER 20 MEQ TBCR Take 20 mEq by mouth daily. 30 tablet 5  . zolpidem (AMBIEN) 10 MG tablet Take 1 tablet (10 mg total) by mouth at bedtime. 30 tablet 1   No current facility-administered medications on file prior to visit.    BP 142/86 mmHg  Pulse 82  Temp(Src) 97.7 F (36.5 C) (Oral)  Ht 5\' 3"  (1.6 m)  Wt 302 lb 6.4 oz (137.168 kg)  BMI 53.58 kg/m2  SpO2 94%       Objective:   Physical Exam   General Mental Status- Alert. General Appearance- Not in acute distress.   Skin General: Color- Normal Color. Moisture- Normal Moisture.  Neck Carotid Arteries- Normal color. Moisture- Normal Moisture. No carotid bruits. No JVD.  Chest and Lung Exam Auscultation: Breath Sounds:-Normal.  Cardiovascular Auscultation:Rythm- Regular. Murmurs & Other Heart Sounds:Auscultation of the heart reveals- No Murmurs.  Abdomen Inspection:-Inspeection Normal. Palpation/Percussion:Note:No mass. Palpation and Percussion of the abdomen reveal- Non Tender, Non Distended + BS, no rebound or guarding.    Neurologic Cranial Nerve exam:- CN III-XII intact(No nystagmus), symmetric smile. Strength:- 5/5 equal and symmetric strength both upper and lower extremities.  Lower ext- faint 1+ edema bilateral. Negative homans signs. NO redness, no warmth and not tender.     Assessment & Plan:

## 2014-12-24 NOTE — Assessment & Plan Note (Signed)
Mild worse recently but similar to baseline. Will increase lasix pending lab and cxr. Continue with o2 and other measures recommended by pulmonologist.

## 2014-12-24 NOTE — Assessment & Plan Note (Signed)
Normal variant pedal edema. Vs possible early chf. With your baseline lung condition and dyspnea it is best to get cmp, bnp and cxr. During interim take 2 of your 20 mg lasix daily. We will call you with lab and cxr results when they are in. If your symptoms worsen or change during the interim let us know.

## 2014-12-24 NOTE — Patient Instructions (Signed)
Pedal edema Normal variant pedal edema. Vs possible early chf. With your baseline lung condition and dyspnea it is best to get cmp, bnp and cxr. During interim take 2 of your 20 mg lasix daily. We will call you with lab and cxr results when they are in. If your symptoms worsen or change during the interim let us know.   Dyspnea Mild worse recently but similar to baseline. Will increase lasix pending lab and cxr. Continue with o2 and other measures recommended by pulmonologist.    Follow up in 7 days or as needed.

## 2014-12-25 ENCOUNTER — Other Ambulatory Visit: Payer: Self-pay

## 2014-12-25 ENCOUNTER — Telehealth: Payer: Self-pay | Admitting: *Deleted

## 2014-12-25 DIAGNOSIS — R609 Edema, unspecified: Secondary | ICD-10-CM

## 2014-12-25 MED ORDER — FUROSEMIDE 20 MG PO TABS: 20.0000 mg | ORAL_TABLET | Freq: Every day | ORAL | Status: DC

## 2014-12-25 NOTE — Telephone Encounter (Signed)
Pt dropped off disability placard. Forwarded to Dr. Etter Sjogren. JG//CMA

## 2014-12-29 NOTE — Telephone Encounter (Signed)
Form completed/signed by Dr. Etter Sjogren. Called and informed pt that original is at our front desk for pick up. Copy sent for scanning. JG//CMA

## 2015-01-01 ENCOUNTER — Ambulatory Visit (INDEPENDENT_AMBULATORY_CARE_PROVIDER_SITE_OTHER): Payer: Commercial Managed Care - HMO | Admitting: Family Medicine

## 2015-01-01 ENCOUNTER — Encounter: Payer: Self-pay | Admitting: Family Medicine

## 2015-01-01 VITALS — BP 120/88 | HR 77 | Temp 98.4°F | Wt 299.8 lb

## 2015-01-01 DIAGNOSIS — Z23 Encounter for immunization: Secondary | ICD-10-CM | POA: Diagnosis not present

## 2015-01-01 DIAGNOSIS — I519 Heart disease, unspecified: Secondary | ICD-10-CM

## 2015-01-01 DIAGNOSIS — R739 Hyperglycemia, unspecified: Secondary | ICD-10-CM

## 2015-01-01 DIAGNOSIS — E876 Hypokalemia: Secondary | ICD-10-CM

## 2015-01-01 DIAGNOSIS — T502X5A Adverse effect of carbonic-anhydrase inhibitors, benzothiadiazides and other diuretics, initial encounter: Secondary | ICD-10-CM

## 2015-01-01 DIAGNOSIS — R609 Edema, unspecified: Secondary | ICD-10-CM | POA: Diagnosis not present

## 2015-01-01 DIAGNOSIS — F411 Generalized anxiety disorder: Secondary | ICD-10-CM

## 2015-01-01 DIAGNOSIS — R748 Abnormal levels of other serum enzymes: Secondary | ICD-10-CM

## 2015-01-01 DIAGNOSIS — J9611 Chronic respiratory failure with hypoxia: Secondary | ICD-10-CM

## 2015-01-01 LAB — BASIC METABOLIC PANEL
BUN: 7 mg/dL (ref 6–23)
CALCIUM: 8.6 mg/dL (ref 8.4–10.5)
CO2: 32 meq/L (ref 19–32)
CREATININE: 0.99 mg/dL (ref 0.40–1.20)
Chloride: 101 mEq/L (ref 96–112)
GFR: 72.99 mL/min (ref >60.00)
Glucose, Bld: 113 mg/dL — ABNORMAL HIGH (ref 70–99)
Potassium: 3.9 mEq/L (ref 3.5–5.1)
Sodium: 135 mEq/L (ref 135–145)

## 2015-01-01 LAB — HEPATIC FUNCTION PANEL
ALBUMIN: 3.3 g/dL — AB (ref 3.5–5.2)
ALK PHOS: 118 U/L — AB (ref 39–117)
ALT: 27 U/L (ref 0–35)
AST: 57 U/L — ABNORMAL HIGH (ref 0–37)
Bilirubin, Direct: 0.2 mg/dL (ref 0.0–0.3)
Total Bilirubin: 0.7 mg/dL (ref 0.2–1.2)
Total Protein: 8.7 g/dL — ABNORMAL HIGH (ref 6.0–8.3)

## 2015-01-01 LAB — HEMOGLOBIN A1C: HEMOGLOBIN A1C: 5.9 % (ref 4.6–6.5)

## 2015-01-01 MED ORDER — ZOSTER VACCINE LIVE 19400 UNT/0.65ML ~~LOC~~ SOLR: 0.6500 mL | Freq: Once | SUBCUTANEOUS | Status: DC

## 2015-01-01 MED ORDER — POTASSIUM CHLORIDE ER 20 MEQ PO TBCR: 20.0000 meq | EXTENDED_RELEASE_TABLET | Freq: Every day | ORAL | Status: DC

## 2015-01-01 MED ORDER — ALPRAZOLAM 0.5 MG PO TABS: 0.5000 mg | ORAL_TABLET | Freq: Two times a day (BID) | ORAL | Status: DC | PRN

## 2015-01-01 MED ORDER — FUROSEMIDE 20 MG PO TABS: 40.0000 mg | ORAL_TABLET | Freq: Every day | ORAL | Status: DC

## 2015-01-01 MED ORDER — PNEUMOCOCCAL 13-VAL CONJ VACC IM SUSP: 0.5000 mL | Freq: Once | INTRAMUSCULAR | Status: DC

## 2015-01-01 NOTE — Progress Notes (Signed)
Patient ID: Sue [name], female    DOB: 16-Oct-1950  Age: 64 y.o. MRN: [id number]    Subjective:  Subjective HPI RASHEIKA [name] presents for f/u edema.  Pt was taking 6 lasix a day  ( she misunderstood) instructions given at last ov.  She became so dry she decreased it to 40 mg a day.  She is still having swelling but it is much better.  No inc sob. No cp.    Review of Systems  Constitutional: Negative for activity change, appetite change, fatigue and unexpected weight change.  Respiratory: Positive for shortness of breath. Negative for cough.   Cardiovascular: Positive for leg swelling. Negative for chest pain and palpitations.  Psychiatric/Behavioral: Negative for behavioral problems and dysphoric mood. The patient is not nervous/anxious.     History Past Medical History  Diagnosis Date  . Hypertension   . Emphysema/COPD   . Depression   . GERD (gastroesophageal reflux disease)   . Urine incontinence     She has past surgical history that includes Abdominal hysterectomy and Cesarean section.   Her family history includes Bipolar disorder in her father; Depression in her father; Diabetes in her sister; Heart disease in her brother, father, paternal aunt, paternal grandmother, and paternal uncle; Heart disease (age of onset: 60) in her sister; Hyperlipidemia in her sister; Hypertension in her brother, father, and sister; Schizophrenia in her paternal aunt.She reports that she quit smoking about 3 years ago. Her smoking use included Cigarettes. She has a 40 pack-year smoking history. She has never used smokeless tobacco. She reports that she drinks alcohol. She reports that she does not use illicit drugs.  Current Outpatient Prescriptions on File Prior to Visit  Medication Sig Dispense Refill  . albuterol (PROVENTIL HFA;VENTOLIN HFA) 108 (90 BASE) MCG/ACT inhaler Inhale 2 puffs into the lungs every 6 (six) hours as needed for wheezing or shortness of breath. 1 Inhaler 0  . aspirin EC  325 MG EC tablet Take 1 tablet (325 mg total) by mouth daily. 60 tablet 0  . levothyroxine (SYNTHROID, LEVOTHROID) 25 MCG tablet Take 1 tablet (25 mcg total) by mouth daily before breakfast. REPEAT LABS ARE DUE NOW 30 tablet 0  . LORazepam (ATIVAN) 2 MG tablet Take 2 mg by mouth at bedtime.    Marland Kitchen losartan (COZAAR) 100 MG tablet Take 1 tablet (100 mg total) by mouth daily. 90 tablet 3  . metoprolol succinate (TOPROL-XL) 50 MG 24 hr tablet Take 2 tablets (100 mg total) by mouth every morning. Take with or immediately following a meal. 180 tablet 1  . OXYGEN Inhale 2 L into the lungs continuous.    Marland Kitchen zolpidem (AMBIEN) 10 MG tablet Take 1 tablet (10 mg total) by mouth at bedtime. 30 tablet 1   No current facility-administered medications on file prior to visit.     Objective:  Objective Physical Exam  Constitutional: She is oriented to person, place, and time. She appears well-developed and well-nourished. No distress.  HENT:  Right Ear: External ear normal.  Left Ear: External ear normal.  Nose: Nose normal.  Mouth/Throat: Oropharynx is clear and moist.  Eyes: EOM are normal. Pupils are equal, round, and reactive to light.  Neck: Normal range of motion. Neck supple.  Cardiovascular: Normal rate, regular rhythm and normal heart sounds.   No murmur heard. Pulmonary/Chest: Effort normal. No respiratory distress. She has decreased breath sounds. She has no wheezes. She has no rales. She exhibits no tenderness.  O2 dependant  Musculoskeletal:  She exhibits edema. She exhibits no tenderness.       Right ankle: She exhibits swelling.       Left ankle: She exhibits swelling.       Right lower leg: She exhibits edema. She exhibits no tenderness.       Left lower leg: She exhibits edema. She exhibits no tenderness.  Neurological: She is alert and oriented to person, place, and time.  Psychiatric: She has a normal mood and affect. Her behavior is normal. Judgment and thought content normal.   BP  120/88 mmHg  Pulse 77  Temp(Src) 98.4 F (36.9 C) (Oral)  Wt 299 lb 12.8 oz (135.988 kg)  SpO2 95% Wt Readings from Last 3 Encounters:  01/01/15 299 lb 12.8 oz (135.988 kg)  12/24/14 302 lb 6.4 oz (137.168 kg)  08/21/14 297 lb (134.718 kg)     Lab Results  Component Value Date   WBC 6.5 08/21/2014   HGB 14.1 08/21/2014   HCT 43.1 08/21/2014   PLT 180.0 08/21/2014   GLUCOSE 113* 01/01/2015   CHOL 119 08/21/2014   TRIG 98.0 08/21/2014   HDL 26.10* 08/21/2014   LDLCALC 73 08/21/2014   ALT 27 01/01/2015   AST 57* 01/01/2015   NA 135 01/01/2015   K 3.9 01/01/2015   CL 101 01/01/2015   CREATININE 0.99 01/01/2015   BUN 7 01/01/2015   CO2 32 01/01/2015   TSH 6.69* 09/02/2014   INR 1.12 07/30/2014   HGBA1C 5.9 01/01/2015    Dg Chest 2 View  12/24/2014   CLINICAL DATA:  Shortness of breath for months.  EXAM: CHEST  2 VIEW  COMPARISON:  11/06/2013  FINDINGS: The cardiac silhouette, mediastinal and hilar contours are within normal limits and stable. The lungs are clear. No pleural effusion. The bony thorax is intact.  IMPRESSION: No acute cardiopulmonary findings.   Electronically Signed   By: Marijo Sanes M.D.   On: 12/24/2014 16:13     Assessment & Plan:  Plan I have changed Ms. Trimmer's furosemide. I am also having her start on zoster vaccine live (PF) and pneumococcal 13-valent conjugate vaccine. Additionally, I am having her maintain her albuterol, LORazepam, OXYGEN, aspirin, metoprolol succinate, zolpidem, losartan, levothyroxine, ALPRAZolam, and Potassium Chloride ER.  Meds ordered this encounter  Medications  . ALPRAZolam (XANAX) 0.5 MG tablet    Sig: Take 1 tablet (0.5 mg total) by mouth 2 (two) times daily as needed for anxiety.    Dispense:  60 tablet    Refill:  0  . furosemide (LASIX) 20 MG tablet    Sig: Take 2 tablets (40 mg total) by mouth daily.    Dispense:  33 tablet    Refill:  01    Increased dosage to 40 mg  . Potassium Chloride ER 20 MEQ TBCR     Sig: Take 20 mEq by mouth daily.    Dispense:  30 tablet    Refill:  5  . zoster vaccine live, PF, (ZOSTAVAX) 16109 UNT/0.65ML injection    Sig: Inject 19,400 Units into the skin once.    Dispense:  1 vial    Refill:  0  . pneumococcal 13-valent conjugate vaccine (PREVNAR 13) SUSP injection    Sig: Inject 0.5 mLs into the muscle once.    Dispense:  1 Syringe    Refill:  0    Problem List Items Addressed This Visit    Chronic respiratory failure    Followed by Pulmonary On O2  Edema    con't lasix Elevated legs Compression stockings Pt has f/u cardiolgy      Relevant Medications   furosemide (LASIX) 20 MG tablet   Mild diastolic dysfunction    Other Visit Diagnoses    Anxiety state    -  Primary    Relevant Medications    ALPRAZolam (XANAX) 0.5 MG tablet    Alkaline phosphatase elevation        Relevant Orders    Hepatic function panel (Completed)    Vitamin D 1,25 dihydroxy    Diuretic-induced hypokalemia        Relevant Medications    Potassium Chloride ER 20 MEQ TBCR    Other Relevant Orders    Basic metabolic panel (Completed)    Need for shingles vaccine        Relevant Medications    zoster vaccine live, PF, (ZOSTAVAX) 09811 UNT/0.65ML injection    Need for prophylactic vaccination against Streptococcus pneumoniae (pneumococcus)        Relevant Medications    pneumococcal 13-valent conjugate vaccine (PREVNAR 13) SUSP injection    Hyperglycemia        Relevant Orders    Hemoglobin A1c (Completed)       Follow-up: Return in about 3 months (around 04/02/2015), or if symptoms worsen or fail to improve, for bp , edema check and labs.  Garnet Koyanagi, DO

## 2015-01-01 NOTE — Assessment & Plan Note (Addendum)
con't lasix Elevated legs Compression stockings Pt has f/u cardiolgy It has improved since last ov

## 2015-01-01 NOTE — Patient Instructions (Signed)

## 2015-01-01 NOTE — Assessment & Plan Note (Signed)
Followed by Pulmonary On O2

## 2015-01-01 NOTE — Progress Notes (Signed)
Pre visit review using our clinic review tool, if applicable. No additional management support is needed unless otherwise documented below in the visit note. 

## 2015-01-04 LAB — VITAMIN D 1,25 DIHYDROXY
VITAMIN D 1, 25 (OH) TOTAL: 44 pg/mL (ref 18–72)
Vitamin D3 1, 25 (OH)2: 44 pg/mL

## 2015-01-08 ENCOUNTER — Other Ambulatory Visit: Payer: Self-pay | Admitting: Family Medicine

## 2015-01-09 ENCOUNTER — Telehealth: Payer: Self-pay | Admitting: Family Medicine

## 2015-01-09 MED ORDER — LEVOTHYROXINE SODIUM 25 MCG PO TABS: 25.0000 ug | ORAL_TABLET | Freq: Every day | ORAL | Status: DC

## 2015-01-09 NOTE — Telephone Encounter (Signed)
Relation to pt: self  Call back number: Pharmacy: [facility] [address] [place] [id number] [phone] (Phone) [phone] (Fax)         Reason for call:  Pt requesting a refill levothyroxine (SYNTHROID, LEVOTHROID) 25 MCG tablet

## 2015-01-10 DIAGNOSIS — J449 Chronic obstructive pulmonary disease, unspecified: Secondary | ICD-10-CM | POA: Diagnosis not present

## 2015-01-10 DIAGNOSIS — J9601 Acute respiratory failure with hypoxia: Secondary | ICD-10-CM | POA: Diagnosis not present

## 2015-03-03 ENCOUNTER — Telehealth: Payer: Self-pay | Admitting: Family Medicine

## 2015-03-03 DIAGNOSIS — R609 Edema, unspecified: Secondary | ICD-10-CM

## 2015-03-03 MED ORDER — FUROSEMIDE 20 MG PO TABS: 40.0000 mg | ORAL_TABLET | Freq: Every day | ORAL | Status: DC

## 2015-03-03 NOTE — Telephone Encounter (Signed)
Rx faxed.    KP 

## 2015-03-03 NOTE — Telephone Encounter (Signed)
Caller name: Jalya Donlin Relationship to patient: self Can be reached: [phone] Pharmacy: [facility] [address]  Reason for call: Pt calling for refill on Lasix. She states she took last one yesterday 03/02/15. Last appt 01/01/15.

## 2015-04-12 DIAGNOSIS — J449 Chronic obstructive pulmonary disease, unspecified: Secondary | ICD-10-CM | POA: Diagnosis not present

## 2015-04-12 DIAGNOSIS — J9601 Acute respiratory failure with hypoxia: Secondary | ICD-10-CM | POA: Diagnosis not present

## 2015-05-01 ENCOUNTER — Other Ambulatory Visit: Payer: Self-pay | Admitting: Family Medicine

## 2015-05-01 NOTE — Telephone Encounter (Signed)
Pt called to f/u on med refill bc she is completely out. Please call in to [place] ph# [phone]

## 2015-05-13 DIAGNOSIS — J449 Chronic obstructive pulmonary disease, unspecified: Secondary | ICD-10-CM | POA: Diagnosis not present

## 2015-05-13 DIAGNOSIS — J9601 Acute respiratory failure with hypoxia: Secondary | ICD-10-CM | POA: Diagnosis not present

## 2015-05-21 DIAGNOSIS — F319 Bipolar disorder, unspecified: Secondary | ICD-10-CM | POA: Diagnosis not present

## 2015-06-01 ENCOUNTER — Other Ambulatory Visit: Payer: Self-pay | Admitting: Family Medicine

## 2015-06-02 NOTE — Telephone Encounter (Signed)
Furosemide refill sent to pharmacy. Pt was due for follow up with Dr Etter Sjogren in 03/2015 and is past due.  Please call pt to arrange f/u before further refills are due.  Thanks!

## 2015-06-04 NOTE — Telephone Encounter (Signed)
Called pt and she will call back to schedule appt. She needs someone to bring her.

## 2015-06-12 DIAGNOSIS — J9601 Acute respiratory failure with hypoxia: Secondary | ICD-10-CM | POA: Diagnosis not present

## 2015-06-12 DIAGNOSIS — J449 Chronic obstructive pulmonary disease, unspecified: Secondary | ICD-10-CM | POA: Diagnosis not present

## 2015-07-13 DIAGNOSIS — J9601 Acute respiratory failure with hypoxia: Secondary | ICD-10-CM | POA: Diagnosis not present

## 2015-07-13 DIAGNOSIS — J449 Chronic obstructive pulmonary disease, unspecified: Secondary | ICD-10-CM | POA: Diagnosis not present

## 2015-08-02 ENCOUNTER — Other Ambulatory Visit: Payer: Self-pay | Admitting: Family Medicine

## 2015-08-12 DIAGNOSIS — J449 Chronic obstructive pulmonary disease, unspecified: Secondary | ICD-10-CM | POA: Diagnosis not present

## 2015-08-12 DIAGNOSIS — J9601 Acute respiratory failure with hypoxia: Secondary | ICD-10-CM | POA: Diagnosis not present

## 2015-09-08 ENCOUNTER — Ambulatory Visit: Payer: Commercial Managed Care - HMO | Admitting: Family Medicine

## 2015-09-09 ENCOUNTER — Telehealth: Payer: Self-pay | Admitting: Family Medicine

## 2015-09-09 NOTE — Telephone Encounter (Signed)
Patient no show appointment 09/08/2015, charge or no charge

## 2015-09-10 NOTE — Telephone Encounter (Signed)
charge 

## 2015-09-12 DIAGNOSIS — J449 Chronic obstructive pulmonary disease, unspecified: Secondary | ICD-10-CM | POA: Diagnosis not present

## 2015-09-12 DIAGNOSIS — J9601 Acute respiratory failure with hypoxia: Secondary | ICD-10-CM | POA: Diagnosis not present

## 2015-09-17 ENCOUNTER — Telehealth: Payer: Self-pay | Admitting: Family Medicine

## 2015-09-17 MED ORDER — FUROSEMIDE 20 MG PO TABS: 40.0000 mg | ORAL_TABLET | Freq: Every day | ORAL | Status: DC

## 2015-09-17 NOTE — Telephone Encounter (Signed)
She needs an OV     KP

## 2015-09-17 NOTE — Telephone Encounter (Signed)
Apt scheduled for 09/24/14.    KP

## 2015-09-17 NOTE — Telephone Encounter (Signed)
Caller name: Clydene   Relationship to patient: Self   Can be reached: [phone]  Reason for call: Pt is requesting a refill on her LASIX Rx.   Pharmacy: [facility] [address] [place] [address]

## 2015-09-18 ENCOUNTER — Ambulatory Visit: Payer: Commercial Managed Care - HMO | Admitting: Family Medicine

## 2015-09-25 ENCOUNTER — Ambulatory Visit: Payer: Commercial Managed Care - HMO | Admitting: Family Medicine

## 2015-09-29 ENCOUNTER — Ambulatory Visit: Payer: Commercial Managed Care - HMO | Admitting: Family Medicine

## 2015-09-29 ENCOUNTER — Telehealth: Payer: Self-pay | Admitting: Family Medicine

## 2015-09-29 NOTE — Telephone Encounter (Signed)
No charge. 

## 2015-09-29 NOTE — Telephone Encounter (Signed)
Daughter stated patient is feeling better therefore 2pm appointment was canceled, charge or no charge .

## 2015-10-09 ENCOUNTER — Telehealth: Payer: Self-pay | Admitting: Behavioral Health

## 2015-10-09 NOTE — Telephone Encounter (Signed)
Assessed the following gaps in care: Mammogram Colon Flu Pap (past Hx of hysterectomy)  Per the patient's daughter, her mother is asleep at this time and will have her to return the call on Monday.

## 2015-10-13 DIAGNOSIS — J449 Chronic obstructive pulmonary disease, unspecified: Secondary | ICD-10-CM | POA: Diagnosis not present

## 2015-10-13 DIAGNOSIS — J9601 Acute respiratory failure with hypoxia: Secondary | ICD-10-CM | POA: Diagnosis not present

## 2015-10-27 ENCOUNTER — Encounter: Payer: Self-pay | Admitting: Family Medicine

## 2015-10-27 ENCOUNTER — Ambulatory Visit (INDEPENDENT_AMBULATORY_CARE_PROVIDER_SITE_OTHER): Payer: Commercial Managed Care - HMO | Admitting: Family Medicine

## 2015-10-27 ENCOUNTER — Other Ambulatory Visit: Payer: Self-pay

## 2015-10-27 VITALS — BP 120/70 | HR 78 | Temp 97.9°F | Ht 63.0 in | Wt 302.8 lb

## 2015-10-27 DIAGNOSIS — Z23 Encounter for immunization: Secondary | ICD-10-CM

## 2015-10-27 DIAGNOSIS — R600 Localized edema: Secondary | ICD-10-CM

## 2015-10-27 DIAGNOSIS — I10 Essential (primary) hypertension: Secondary | ICD-10-CM | POA: Diagnosis not present

## 2015-10-27 DIAGNOSIS — Z8719 Personal history of other diseases of the digestive system: Secondary | ICD-10-CM | POA: Diagnosis not present

## 2015-10-27 DIAGNOSIS — F411 Generalized anxiety disorder: Secondary | ICD-10-CM

## 2015-10-27 DIAGNOSIS — E039 Hypothyroidism, unspecified: Secondary | ICD-10-CM

## 2015-10-27 LAB — LIPID PANEL
CHOL/HDL RATIO: 4
Cholesterol: 104 mg/dL (ref 0–200)
HDL: 26.1 mg/dL — ABNORMAL LOW (ref >39.00)
LDL CALC: 64 mg/dL (ref 0–99)
NONHDL: 77.47
Triglycerides: 69 mg/dL (ref 0.0–149.0)
VLDL: 13.8 mg/dL (ref 0.0–40.0)

## 2015-10-27 LAB — CBC WITH DIFFERENTIAL/PLATELET
BASOS PCT: 0.4 % (ref 0.0–3.0)
Basophils Absolute: 0 10*3/uL (ref 0.0–0.1)
Eosinophils Absolute: 0.2 10*3/uL (ref 0.0–0.7)
Eosinophils Relative: 3.5 % (ref 0.0–5.0)
HCT: 39.3 % (ref 36.0–46.0)
Hemoglobin: 13.4 g/dL (ref 12.0–15.0)
LYMPHS ABS: 3 10*3/uL (ref 0.7–4.0)
Lymphocytes Relative: 54.6 % — ABNORMAL HIGH (ref 12.0–46.0)
MCHC: 34.1 g/dL (ref 30.0–36.0)
MCV: 98.2 fl (ref 78.0–100.0)
MONOS PCT: 7.3 % (ref 3.0–12.0)
Monocytes Absolute: 0.4 10*3/uL (ref 0.1–1.0)
NEUTROS ABS: 1.9 10*3/uL (ref 1.4–7.7)
NEUTROS PCT: 34.2 % — AB (ref 43.0–77.0)
PLATELETS: 160 10*3/uL (ref 150.0–400.0)
RBC: 4.01 Mil/uL (ref 3.87–5.11)
RDW: 14.4 % (ref 11.5–15.5)
WBC: 5.4 10*3/uL (ref 4.0–10.5)

## 2015-10-27 LAB — POCT URINALYSIS DIPSTICK
Bilirubin, UA: NEGATIVE
Glucose, UA: NEGATIVE
Leukocytes, UA: NEGATIVE
NITRITE UA: NEGATIVE
PH UA: 6
PROTEIN UA: NEGATIVE
RBC UA: NEGATIVE
SPEC GRAV UA: 1.015
UROBILINOGEN UA: 0.2

## 2015-10-27 LAB — COMPREHENSIVE METABOLIC PANEL
ALK PHOS: 113 U/L (ref 39–117)
ALT: 22 U/L (ref 0–35)
AST: 45 U/L — ABNORMAL HIGH (ref 0–37)
Albumin: 3.3 g/dL — ABNORMAL LOW (ref 3.5–5.2)
BUN: 8 mg/dL (ref 6–23)
CHLORIDE: 99 meq/L (ref 96–112)
CO2: 32 mEq/L (ref 19–32)
Calcium: 8.8 mg/dL (ref 8.4–10.5)
Creatinine, Ser: 1.09 mg/dL (ref 0.40–1.20)
GFR: 65.14 mL/min (ref >60.00)
GLUCOSE: 104 mg/dL — AB (ref 70–99)
Potassium: 3 mEq/L — ABNORMAL LOW (ref 3.5–5.1)
SODIUM: 139 meq/L (ref 135–145)
Total Bilirubin: 0.9 mg/dL (ref 0.2–1.2)
Total Protein: 8.6 g/dL — ABNORMAL HIGH (ref 6.0–8.3)

## 2015-10-27 LAB — TSH: TSH: 4.7 u[IU]/mL — AB (ref 0.35–4.50)

## 2015-10-27 LAB — HEMOGLOBIN A1C: Hgb A1c MFr Bld: 5.8 % (ref 4.6–6.5)

## 2015-10-27 MED ORDER — FUROSEMIDE 20 MG PO TABS: ORAL_TABLET | ORAL | Status: DC

## 2015-10-27 MED ORDER — NYSTATIN 100000 UNIT/ML MT SUSP: OROMUCOSAL | Status: DC

## 2015-10-27 MED ORDER — ALPRAZOLAM 0.5 MG PO TABS: 0.5000 mg | ORAL_TABLET | Freq: Two times a day (BID) | ORAL | Status: DC | PRN

## 2015-10-27 NOTE — Progress Notes (Signed)
Pre visit review using our clinic review tool, if applicable. No additional management support is needed unless otherwise documented below in the visit note. 

## 2015-10-27 NOTE — Patient Instructions (Signed)
DASH Eating Plan  DASH stands for "Dietary Approaches to Stop Hypertension." The DASH eating plan is a healthy eating plan that has been shown to reduce high blood pressure (hypertension). Additional health benefits may include reducing the risk of type 2 diabetes mellitus, heart disease, and stroke. The DASH eating plan may also help with weight loss.  WHAT DO I NEED TO KNOW ABOUT THE DASH EATING PLAN?  For the DASH eating plan, you will follow these general guidelines:  · Choose foods with a percent daily value for sodium of less than 5% (as listed on the food label).  · Use salt-free seasonings or herbs instead of table salt or sea salt.  · Check with your health care provider or pharmacist before using salt substitutes.  · Eat lower-sodium products, often labeled as "lower sodium" or "no salt added."  · Eat fresh foods.  · Eat more vegetables, fruits, and low-fat dairy products.  · Choose whole grains. Look for the word "whole" as the first word in the ingredient list.  · Choose fish and skinless chicken or turkey more often than red meat. Limit fish, poultry, and meat to 6 oz (170 g) each day.  · Limit sweets, desserts, sugars, and sugary drinks.  · Choose heart-healthy fats.  · Limit cheese to 1 oz (28 g) per day.  · Eat more home-cooked food and less restaurant, buffet, and fast food.  · Limit fried foods.  · Cook foods using methods other than frying.  · Limit canned vegetables. If you do use them, rinse them well to decrease the sodium.  · When eating at a restaurant, ask that your food be prepared with less salt, or no salt if possible.  WHAT FOODS CAN I EAT?  Seek help from a dietitian for individual calorie needs.  Grains  Whole grain or whole wheat bread. Brown rice. Whole grain or whole wheat pasta. Quinoa, bulgur, and whole grain cereals. Low-sodium cereals. Corn or whole wheat flour tortillas. Whole grain cornbread. Whole grain crackers. Low-sodium crackers.  Vegetables  Fresh or frozen vegetables  (raw, steamed, roasted, or grilled). Low-sodium or reduced-sodium tomato and vegetable juices. Low-sodium or reduced-sodium tomato sauce and paste. Low-sodium or reduced-sodium canned vegetables.   Fruits  All fresh, canned (in natural juice), or frozen fruits.  Meat and Other Protein Products  Ground beef (85% or leaner), grass-fed beef, or beef trimmed of fat. Skinless chicken or turkey. Ground chicken or turkey. Pork trimmed of fat. All fish and seafood. Eggs. Dried beans, peas, or lentils. Unsalted nuts and seeds. Unsalted canned beans.  Dairy  Low-fat dairy products, such as skim or 1% milk, 2% or reduced-fat cheeses, low-fat ricotta or cottage cheese, or plain low-fat yogurt. Low-sodium or reduced-sodium cheeses.  Fats and Oils  Tub margarines without trans fats. Light or reduced-fat mayonnaise and salad dressings (reduced sodium). Avocado. Safflower, olive, or canola oils. Natural peanut or almond butter.  Other  Unsalted popcorn and pretzels.  The items listed above may not be a complete list of recommended foods or beverages. Contact your dietitian for more options.  WHAT FOODS ARE NOT RECOMMENDED?  Grains  White bread. White pasta. White rice. Refined cornbread. Bagels and croissants. Crackers that contain trans fat.  Vegetables  Creamed or fried vegetables. Vegetables in a cheese sauce. Regular canned vegetables. Regular canned tomato sauce and paste. Regular tomato and vegetable juices.  Fruits  Dried fruits. Canned fruit in light or heavy syrup. Fruit juice.  Meat and Other Protein   Products  Fatty cuts of meat. Ribs, chicken wings, bacon, sausage, bologna, salami, chitterlings, fatback, hot dogs, bratwurst, and packaged luncheon meats. Salted nuts and seeds. Canned beans with salt.  Dairy  Whole or 2% milk, cream, half-and-half, and cream cheese. Whole-fat or sweetened yogurt. Full-fat cheeses or blue cheese. Nondairy creamers and whipped toppings. Processed cheese, cheese spreads, or cheese  curds.  Condiments  Onion and garlic salt, seasoned salt, table salt, and sea salt. Canned and packaged gravies. Worcestershire sauce. Tartar sauce. Barbecue sauce. Teriyaki sauce. Soy sauce, including reduced sodium. Steak sauce. Fish sauce. Oyster sauce. Cocktail sauce. Horseradish. Ketchup and mustard. Meat flavorings and tenderizers. Bouillon cubes. Hot sauce. Tabasco sauce. Marinades. Taco seasonings. Relishes.  Fats and Oils  Butter, stick margarine, lard, shortening, ghee, and bacon fat. Coconut, palm kernel, or palm oils. Regular salad dressings.  Other  Pickles and olives. Salted popcorn and pretzels.  The items listed above may not be a complete list of foods and beverages to avoid. Contact your dietitian for more information.  WHERE CAN I FIND MORE INFORMATION?  National Heart, Lung, and Blood Institute: www.nhlbi.nih.gov/health/health-topics/topics/dash/     This information is not intended to replace advice given to you by your health care provider. Make sure you discuss any questions you have with your health care provider.     Document Released: 08/18/2011 Document Revised: 09/19/2014 Document Reviewed: 07/03/2013  Elsevier Interactive Patient Education ©2016 [employer]vier Inc[employer]

## 2015-10-29 ENCOUNTER — Other Ambulatory Visit: Payer: Self-pay | Admitting: Family Medicine

## 2015-10-29 DIAGNOSIS — R600 Localized edema: Secondary | ICD-10-CM

## 2015-10-29 DIAGNOSIS — E039 Hypothyroidism, unspecified: Secondary | ICD-10-CM | POA: Insufficient documentation

## 2015-10-29 DIAGNOSIS — G4700 Insomnia, unspecified: Secondary | ICD-10-CM

## 2015-10-29 MED ORDER — LOSARTAN POTASSIUM 100 MG PO TABS: 100.0000 mg | ORAL_TABLET | Freq: Every day | ORAL | Status: DC

## 2015-10-29 MED ORDER — METOPROLOL SUCCINATE ER 50 MG PO TB24: ORAL_TABLET | ORAL | Status: DC

## 2015-10-29 MED ORDER — FUROSEMIDE 20 MG PO TABS: ORAL_TABLET | ORAL | Status: DC

## 2015-10-29 NOTE — Assessment & Plan Note (Signed)
con't synthroid 

## 2015-10-29 NOTE — Progress Notes (Signed)
Patient ID: Sue [name], female    DOB: Dec 02, 1950  Age: 65 y.o. MRN: [id number]    Subjective:  Subjective HPI Sue [name] presents for c/o mouth sores that are no better   She also c/o diarrhea that was last week but has now resolved. Review of Systems  Constitutional: Negative for diaphoresis, appetite change, fatigue and unexpected weight change.  HENT: Positive for mouth sores. Negative for congestion, ear discharge, ear pain, facial swelling, hearing loss, sinus pressure, sneezing, sore throat, tinnitus, trouble swallowing and voice change.   Eyes: Negative for pain, redness and visual disturbance.  Respiratory: Negative for cough, chest tightness, shortness of breath and wheezing.   Cardiovascular: Negative for chest pain, palpitations and leg swelling.  Gastrointestinal: Negative for abdominal pain, diarrhea, constipation and abdominal distention.  Endocrine: Negative for cold intolerance, heat intolerance, polydipsia, polyphagia and polyuria.  Genitourinary: Negative for dysuria, frequency and difficulty urinating.  Neurological: Negative for dizziness, light-headedness, numbness and headaches.    History Past Medical History  Diagnosis Date  . Hypertension   . Emphysema/COPD ([place])   . Depression   . GERD (gastroesophageal reflux disease)   . Urine incontinence     She has past surgical history that includes Abdominal hysterectomy and Cesarean section.   Her family history includes Bipolar disorder in her father; Depression in her father; Diabetes in her sister; Heart disease in her brother, father, paternal aunt, paternal grandmother, and paternal uncle; Heart disease (age of onset: 33) in her sister; Hyperlipidemia in her sister; Hypertension in her brother, father, and sister; Schizophrenia in her paternal aunt.She reports that she quit smoking about 4 years ago. Her smoking use included Cigarettes. She has a 40 pack-year smoking history. She has never used smokeless  tobacco. She reports that she drinks alcohol. She reports that she does not use illicit drugs.  Current Outpatient Prescriptions on File Prior to Visit  Medication Sig Dispense Refill  . albuterol (PROVENTIL HFA;VENTOLIN HFA) 108 (90 BASE) MCG/ACT inhaler Inhale 2 puffs into the lungs every 6 (six) hours as needed for wheezing or shortness of breath. 1 Inhaler 0  . aspirin EC 325 MG EC tablet Take 1 tablet (325 mg total) by mouth daily. 60 tablet 0  . levothyroxine (SYNTHROID, LEVOTHROID) 25 MCG tablet Take 1 tablet (25 mcg total) by mouth daily before breakfast. TSH DUE NOW 30 tablet 0  . LORazepam (ATIVAN) 2 MG tablet Take 2 mg by mouth at bedtime.    . OXYGEN Inhale 3 L into the lungs continuous.     . pneumococcal 13-valent conjugate vaccine (PREVNAR 13) SUSP injection Inject 0.5 mLs into the muscle once. 1 Syringe 0  . Potassium Chloride ER 20 MEQ TBCR Take 20 mEq by mouth daily. 30 tablet 5  . zolpidem (AMBIEN) 10 MG tablet Take 1 tablet (10 mg total) by mouth at bedtime. 30 tablet 1   No current facility-administered medications on file prior to visit.     Objective:  Objective Physical Exam  Constitutional: She is oriented to person, place, and time. She appears well-developed and well-nourished.  HENT:  Head: Normocephalic and atraumatic.  Mouth/Throat: Oral lesions present.    Eyes: Conjunctivae and EOM are normal.  Neck: Normal range of motion. Neck supple. No JVD present. Carotid bruit is not present. No thyromegaly present.  Cardiovascular: Normal rate, regular rhythm and normal heart sounds.   No murmur heard. Pulmonary/Chest: Effort normal and breath sounds normal. No respiratory distress. She has no wheezes. She has  no rales. She exhibits no tenderness.  Musculoskeletal: She exhibits no edema.  Neurological: She is alert and oriented to person, place, and time.  Psychiatric: She has a normal mood and affect.  Nursing note and vitals reviewed.  BP 120/70 mmHg  Pulse  78  Temp(Src) 97.9 F (36.6 C) (Oral)  Ht '5\' 3"'  (1.6 m)  Wt 302 lb 12.8 oz (137.349 kg)  BMI 53.65 kg/m2  SpO2 96% Wt Readings from Last 3 Encounters:  10/27/15 302 lb 12.8 oz (137.349 kg)  01/01/15 299 lb 12.8 oz (135.988 kg)  12/24/14 302 lb 6.4 oz (137.168 kg)     Lab Results  Component Value Date   WBC 5.4 10/27/2015   HGB 13.4 10/27/2015   HCT 39.3 10/27/2015   PLT 160.0 10/27/2015   GLUCOSE 104* 10/27/2015   CHOL 104 10/27/2015   TRIG 69.0 10/27/2015   HDL 26.10* 10/27/2015   LDLCALC 64 10/27/2015   ALT 22 10/27/2015   AST 45* 10/27/2015   NA 139 10/27/2015   K 3.0* 10/27/2015   CL 99 10/27/2015   CREATININE 1.09 10/27/2015   BUN 8 10/27/2015   CO2 32 10/27/2015   TSH 4.70* 10/27/2015   INR 1.12 07/30/2014   HGBA1C 5.8 10/27/2015    Dg Chest 2 View  12/24/2014  CLINICAL DATA:  Shortness of breath for months. EXAM: CHEST  2 VIEW COMPARISON:  11/06/2013 FINDINGS: The cardiac silhouette, mediastinal and hilar contours are within normal limits and stable. The lungs are clear. No pleural effusion. The bony thorax is intact. IMPRESSION: No acute cardiopulmonary findings. Electronically Signed   By: Marijo Sanes M.D.   On: 12/24/2014 16:13     Assessment & Plan:  Plan I have discontinued Ms. Weikel's zoster vaccine live (PF) and furosemide. I am also having her start on nystatin. Additionally, I am having her maintain her albuterol, LORazepam, OXYGEN, aspirin, zolpidem, Potassium Chloride ER, pneumococcal 13-valent conjugate vaccine, and levothyroxine.  Meds ordered this encounter  Medications  . nystatin (MYCOSTATIN) 100000 UNIT/ML suspension    Sig: 5 ml swish and spit qid    Dispense:  60 mL    Refill:  0  . DISCONTD: furosemide (LASIX) 20 MG tablet    Sig: 3 po qd    Dispense:  270 tablet    Refill:  1    Problem List Items Addressed This Visit    None    Visit Diagnoses    Essential hypertension    -  Primary    Relevant Orders    CBC with  Differential/Platelet (Completed)    Hemoglobin A1c (Completed)    Lipid panel (Completed)    POCT urinalysis dipstick (Completed)    TSH (Completed)    Comp Met (CMET) (Completed)    Hypothyroidism, unspecified hypothyroidism type        Relevant Orders    CBC with Differential/Platelet (Completed)    Hemoglobin A1c (Completed)    Lipid panel (Completed)    POCT urinalysis dipstick (Completed)    TSH (Completed)    Comp Met (CMET) (Completed)    H/O oral aphthous ulcers        Relevant Medications    nystatin (MYCOSTATIN) 100000 UNIT/ML suspension    Edema of both legs        Need for prophylactic vaccination and inoculation against influenza        Relevant Orders    Flu Vaccine QUAD 36+ mos PF IM (Fluarix & Fluzone Quad PF) (Completed)  Follow-up: Return in about 6 months (around 04/25/2016), or if symptoms worsen or fail to improve.  Garnet Koyanagi, DO

## 2015-10-29 NOTE — Assessment & Plan Note (Signed)
con't losartan and metoprolol stable

## 2015-10-29 NOTE — Telephone Encounter (Signed)
Patient was seen on 08/25/16 and Ambien filled 08/21/14 #30 with 1 refill. Please advise     KP

## 2015-10-29 NOTE — Telephone Encounter (Signed)
[facility] on [address]  Pt called for refills on the following meds. Zolpidem - 2 weeks Metoprolol - 2 weeks Losartan - 2 weeks Furosemide - out Lorazepam - 2 weeks

## 2015-10-30 MED ORDER — LORAZEPAM 2 MG PO TABS
2.0000 mg | ORAL_TABLET | Freq: Every day | ORAL | Status: DC
Start: 2015-10-30 — End: 2015-12-15

## 2015-10-30 MED ORDER — ZOLPIDEM TARTRATE 5 MG PO TABS: 5.0000 mg | ORAL_TABLET | Freq: Every day | ORAL | Status: DC

## 2015-10-30 NOTE — Telephone Encounter (Signed)
Rosalita Chessman, DO  Ewing Schlein, CMA           Ok to fill lorazapam  amben 5 mg --- 10 mg not recommended for women --- only 5 ng '#30 No refills

## 2015-10-30 NOTE — Addendum Note (Signed)
Addended by: Ewing Schlein on: 10/30/2015 08:09 AM   Modules accepted: Orders

## 2015-10-30 NOTE — Telephone Encounter (Signed)
Rx faxed.    KP 

## 2015-11-03 ENCOUNTER — Other Ambulatory Visit: Payer: Self-pay | Admitting: Family Medicine

## 2015-11-03 DIAGNOSIS — E876 Hypokalemia: Secondary | ICD-10-CM

## 2015-11-03 DIAGNOSIS — E038 Other specified hypothyroidism: Secondary | ICD-10-CM

## 2015-11-03 MED ORDER — LEVOTHYROXINE SODIUM 50 MCG PO TABS: 50.0000 ug | ORAL_TABLET | Freq: Every day | ORAL | Status: DC

## 2015-11-05 DIAGNOSIS — F319 Bipolar disorder, unspecified: Secondary | ICD-10-CM | POA: Diagnosis not present

## 2015-11-10 DIAGNOSIS — J449 Chronic obstructive pulmonary disease, unspecified: Secondary | ICD-10-CM | POA: Diagnosis not present

## 2015-11-10 DIAGNOSIS — J9601 Acute respiratory failure with hypoxia: Secondary | ICD-10-CM | POA: Diagnosis not present

## 2015-11-19 ENCOUNTER — Encounter: Payer: Self-pay | Admitting: Licensed Clinical Social Worker

## 2015-11-19 ENCOUNTER — Other Ambulatory Visit: Payer: Self-pay | Admitting: Licensed Clinical Social Worker

## 2015-11-19 ENCOUNTER — Other Ambulatory Visit: Payer: Self-pay

## 2015-11-19 DIAGNOSIS — J441 Chronic obstructive pulmonary disease with (acute) exacerbation: Secondary | ICD-10-CM

## 2015-11-19 NOTE — Patient Outreach (Signed)
[facility]) Care Management  11/19/2015  Sue [name] 1951/01/12 [id number]   Assessment-  CSW received referral. Patient is in need of housing resources as she wishes to relocate and she has unstable transportation. CSW completed initial outreach and was able to successfully reach her. Patient provided HIPPA verifications. CSW introduced self, reason for call and of [place]. Patient is agreeable to social work assistance.   Patient reports that she applied for subsidized housing and was approved. She shares that she contacted them but was informed that she cannot speak to caseworker but that they sent an approval letter in the mail informing her that she had been approved for services. Patient states that she has not heard back since. Patient expresses a need to relocate as she resides with daughter and wishes to move. CSW will contact [employer] to follow up. Patient states that she receives $1,200 per month from SSI. She states that she can afford $400 to $500 in rent. CSW will go to [place] housing search and print out available options in this price range. Patient reports that she did not qualify for Medicaid.  Patient is also in need of SCAT services. Patient reports that she previously applied twice and was denied both times. Patient is agreeable to home visit on 11/20/15 in order to complete application again. CSW educated her on [employer] as well.  CSW contacted [employer] and left a HIPPA compliant voice message encouraging return call.  Plan-CSW will send involvement letter to PCP and complete home visit on 11/20/15.  [name], BSW, MSW, [email or web address] Phone: [phone] Fax: [phone]

## 2015-11-19 NOTE — Patient Outreach (Signed)
[facility]) Care Management  11/19/2015  Sue [name] 1951-06-13 [id number]   Telephone Screen  Referral Date: 11/19/15 Referral Source: patient self-referral Referral Reason: "COPD and assistance with finding a place"   Outreach attempt #1 to patient. Patient reached. Screening completed.   Social: Patient currently living with her dtr and family. She states that she is looking to find her an apartment so that she can live on her own. She states that she feels she is "in the way" and a "burden" to her dtr. Patient reports that dtr has limited time and availability to assist her. She reports that she does qualify for subsidized housing and would like to be able to live on her own. DME in the home include shower chair and oxygen(3L/min/cont.) Patient reports multiple falls within past several months. She states she has fallen at least three times within last three months. She wants to work with OT/PT. Patient reports issues with transportation. She relies on dtr to get off work to take her but reports due to dtr's changing work schedule/hours its very difficult. She admits to having missed several MD appts.  Conditions: She has PMH of HTN, COPD, depression and GERD. Per patient report her COPD is "getting worse." She also voices that she is "getting weaker and needing more help these days."   Medications: Patient voices difficulty with affording and obtaining meds. She reports she applied for Medicaid within the last year but was a "few dollars over" income limit. She states that she has a hard time getting refills picked up due to transportation issues. Patient was unaware of [employer] mail order but interested in further info.  Appointments: Patient saw PCP last on 10/29/15  Consent: Patient gave verbal consent for [facility] services.    Plan: RN CM will notify [facility] administrative assistant of case status. RN CM will send [facility] community referral for further in home eval and  assessment. RN CM will send [facility] SW referral for housing and transportation assistance. RN CM will send [facility] pharmacy referral for possible med assistance. RN CM provided patient with [facility] contact info.  [name], RN,BSN,CCM [place] Management Telephonic Care Management Coordinator Direct Phone: [phone] Toll Free: [phone] Fax: [phone]

## 2015-11-19 NOTE — Patient Outreach (Signed)
Referral received today for community care coordination. Per referral,  SELF REFERRAL FROM PATIENT             Please see RN CM note for further info.([place] referral           placed as well)           Patient has h/o: HTN,COPD(oxygen dependent), depression, GERD.           Currently living with dtr but wants to move out.           Patient reports multiple falls within last three months.           States her COPD is getting worse and she is getting weaker.           Needs further in home eval and assessment.     Initial assessment completed by telephone. Patient and RNCM collaborated to form patient's case management care plan to focus on Falls Prevention and home safety.  Home visit scheduled for next week for further Safety and chronic disease education.

## 2015-11-20 ENCOUNTER — Other Ambulatory Visit: Payer: Self-pay | Admitting: Licensed Clinical Social Worker

## 2015-11-20 ENCOUNTER — Encounter: Payer: Self-pay | Admitting: Licensed Clinical Social Worker

## 2015-11-20 NOTE — Patient Outreach (Signed)
[facility]) Care Management  [facility] Social Work  11/20/2015  Sue [name] [birth year] [id number]  Current Medications:  Current Outpatient Prescriptions  Medication Sig Dispense Refill  . albuterol (PROVENTIL HFA;VENTOLIN HFA) 108 (90 BASE) MCG/ACT inhaler Inhale 2 puffs into the lungs every 6 (six) hours as needed for wheezing or shortness of breath. 1 Inhaler 0  . ALPRAZolam (XANAX) 0.5 MG tablet Take 1 tablet (0.5 mg total) by mouth 2 (two) times daily as needed for anxiety. 60 tablet 0  . aspirin EC 325 MG EC tablet Take 1 tablet (325 mg total) by mouth daily. 60 tablet 0  . furosemide (LASIX) 20 MG tablet 3 po qd 270 tablet 1  . levothyroxine (SYNTHROID, LEVOTHROID) 50 MCG tablet Take 1 tablet (50 mcg total) by mouth daily. 30 tablet 2  . LORazepam (ATIVAN) 2 MG tablet Take 1 tablet (2 mg total) by mouth at bedtime. 30 tablet 0  . losartan (COZAAR) 100 MG tablet Take 1 tablet (100 mg total) by mouth daily. 90 tablet 1  . metoprolol succinate (TOPROL-XL) 50 MG 24 hr tablet TAKE TWO TABLETS BY MOUTH IN THE MORNING TAKE  WITH  OR  IMMEDIATELY  FOLLOWING  A  MEAL 180 tablet 1  . nystatin (MYCOSTATIN) 100000 UNIT/ML suspension 5 ml swish and spit qid 60 mL 0  . OXYGEN Inhale 3 L into the lungs continuous.     . pneumococcal 13-valent conjugate vaccine (PREVNAR 13) SUSP injection Inject 0.5 mLs into the muscle once. 1 Syringe 0  . Potassium Chloride ER 20 MEQ TBCR Take 20 mEq by mouth daily. (Patient taking differently: Take 20 mEq by mouth 2 (two) times daily. ) 30 tablet 5  . zolpidem (AMBIEN) 5 MG tablet Take 1 tablet (5 mg total) by mouth at bedtime. 30 tablet 0   No current facility-administered medications for this visit.    Functional Status:  In your present state of health, do you have any difficulty performing the following activities: 11/19/2015 10/27/2015  Hearing? N N  Vision? Y N  Difficulty concentrating or making decisions? Sue [name]  Walking or climbing  stairs? Y Y  Dressing or bathing? Y N  Doing errands, shopping? Y N  Preparing Food and eating ? Y -  Using the Toilet? N -  In the past six months, have you accidently leaked urine? N -  Do you have problems with loss of bowel control? N -  Managing your Medications? N -  Managing your Finances? N -  Housekeeping or managing your Housekeeping? Y -    Fall/Depression Screening:  PHQ 2/9 Scores 11/20/2015 11/19/2015  PHQ - 2 Score 1 1    Assessment: CSW arrived to patient's home to complete visit. Patient provided consent to sign. CSW completed assessment with patient. Patient reports that she is feeling well today and that her feet are not as swollen as they usually are. She is agreeable to completing SCAT application. CSW will fax completed application by [phone]. Patient understands that SCAT application process. Patient was provided a copy of additional transportation resources which includes Environmental manager. She was also provided with a copy of [employer] transportation instuctions on how to schedule a ride. Patient was unaware that [facility] transportation can be provide services to her.   Patient reports that she suffers from depression and anxiety. She sees a Teacher, music in [place], [place] and receives medications. She reports that these medications are beneficial to her. CSW completed medication review.  Patient is interested in increasing her socialization. Patient interested in [facility] information. CSW provided patient with a calendar for the months of March and April for [facility] which is 2.5 miles away from her residence. Patient had several questions about the programs they offered and CSW educated her on them. Patient denies needing mental health resources.  Patient denies wishing to complete an advance directive at this time. She reports that she rather complete document once she feels more settled.   Patient denies needing Mobile Meals referral after  stating that she has difficulty fixing meals for herself. She shares that her daughter (who she lives with) assist her at times with meals. Patient reports that she wishes to improve health and be able to be more independent.  CSW providing patient with a complete list of houses and apartments from [employer] within the [facility] area and in her price range. CSW also provided additional housing resources to patient including [employer]. Patient was informed that CSW made attempt to contact [employer] and left message but has not heard back. Patient is agreeable to contacted list of houses provided to her.   CSW provided patient with a medication pill box as her current one was very small and confusing per patient report. CSW also provided her with a [facility] calendar.   Patient does not have scales or blood pressure monitor. CSW will update RNCM.  Plan: CSW will update RNCM. CSW will route encounter to PCP. CSW will fax completed SCAT application. CSW will monitor transportation closely.     [name], BSW, MSW, [email or web address] Phone: [phone] Fax: [phone]

## 2015-11-23 ENCOUNTER — Other Ambulatory Visit: Payer: Self-pay

## 2015-11-23 NOTE — Patient Outreach (Signed)
[place] [facility]) Care Management  11/23/2015  Sue [name] 04/20/1951 [id number]   Initial home visit for [facility] Coordination. Met with patient at her home which she currently shares with her daughter and daughter's son and daughter. 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  Patient and RNCM collaborated to form her case management care plan. Patent states she knows very little about heart failure and has had experienced at least 3 falls in the last 12 month.  Member viewed EMMI videos on Heart failure and Fall/Patient Safety. Patient states she does not want to use a walker or cane, feels she would not fall if she would be more active.  Patient admits to a very sedentary lifestyle, says she knows better and has the opportunity to go out but elects not to.  Patient states she has an application with [place] [facility] for about 4 years.  Call made to [place] [employer] to follow up with status.  This RNCM was told patient was denied in 2017 because she was in [place] and was not eligible, however, patient was placed back on the list now that she has a [place] address and patient needed to come to [place] on [employer] to complete a form to update.  Patient states she is interested in attended a day program, was agreeable to a referral to [facility].   Plan: Home visit on 3/28 for COPD and HF Education

## 2015-11-27 ENCOUNTER — Ambulatory Visit: Payer: Commercial Managed Care - HMO

## 2015-12-01 ENCOUNTER — Other Ambulatory Visit: Payer: Self-pay | Admitting: Family Medicine

## 2015-12-01 ENCOUNTER — Other Ambulatory Visit: Payer: Self-pay | Admitting: Licensed Clinical Social Worker

## 2015-12-01 NOTE — Telephone Encounter (Signed)
Last seen and filled 10/27/15 #60   Please advise    KP

## 2015-12-01 NOTE — Patient Outreach (Signed)
[place] [facility]) Care Management  12/01/2015  Sue [name] 27-Jun-1951 [id number]   Assessment-CSW completed outreach to patient on 12/01/15. Patient answered. Patient seems confused on community resources that CSW was assisting her with. She initially states that she received a call from SCAT and then later said that she did not. CSW will contact Floydene Flock from SCAT to question her application status. CSW questioned if she went to [employer] to update application. Patient states that she got off the phone with [employer] today and was informed that she did not need to do this. Patient shares that she was also informed that she is on the waiting list and to wait to receive a call. CSW left a message with [employer].  Plan-CSW sent email and left a HIPPA compliant voice message with Floydene Flock in regards to SCAT application. CSW will assist patient with all social work needs and community resources.  [name], BSW, MSW, [email or web address] Phone: [phone] Fax: [phone]

## 2015-12-02 ENCOUNTER — Other Ambulatory Visit: Payer: Self-pay | Admitting: Licensed Clinical Social Worker

## 2015-12-02 NOTE — Patient Outreach (Signed)
[name] Care Management  12/02/2015  Sue [name] 1950-12-27 [id number]   Assessment-CSW received secure email from [name]. SCAT plans to contact patient on 12/04/15 to scheduled assessment appointment. They had not attempted initial outreach yet.  Plan-CSW will continue to assist patient with all social work needs.  [name], BSW, MSW, [name] Phone: [phone] Fax: [phone]

## 2015-12-02 NOTE — Telephone Encounter (Signed)
VM left with Rx information for the pharmacy.    KP

## 2015-12-03 ENCOUNTER — Other Ambulatory Visit: Payer: Self-pay | Admitting: Licensed Clinical Social Worker

## 2015-12-03 NOTE — Patient Outreach (Signed)
[place] [facility]) Care Management  12/03/2015  Sue [name] 05-14-1951 [id number]   Assessment-CSW received secure email reply from Sue [name] stating that assessment appointment was scheduled for 12/23/15 at 9:00 am. SCAT states that daughter can transport her but if she is in need of transportation to appointment then she can call. CSW completed outreach to patient but was unable to reach her. HIPPA compliant voice message left.  Plan-CSW will continue to monitor process in gaining stable transportation with SCAT.  Sue [name], BSW, MSW, [name] Phone: [phone] Fax: [phone]

## 2015-12-07 ENCOUNTER — Other Ambulatory Visit: Payer: Self-pay | Admitting: Licensed Clinical Social Worker

## 2015-12-07 ENCOUNTER — Other Ambulatory Visit: Payer: Self-pay | Admitting: Pharmacist

## 2015-12-07 NOTE — Patient Outreach (Signed)
[place] [facility]) Care Management  12/07/2015  Sue [name] Oct 31, 1950 [id number]  [facility] CM Pharmacist received referral from [place], [place], [facility] Telephonic for medication assistance and patient questions about using mail order pharmacy.   Pharmacist called patient this morning and verified name and date of birth with patient.  Pharmacist explained purpose of call and patient was appreciative for call.  Patient reports that she has upcoming doctor appointments over the next week and she would like to call [facility] Pharmacist back after these appointments.    [facility] Pharmacist provided patient with phone number to call back.  Patient anticipates calling pharmacist back next week.  Pharmacist will call patient in two weeks if patient has not called pharmacist back.  Patient was agreeable to this.    Karrie Meres, PharmD, [place] [phone]

## 2015-12-07 NOTE — Patient Outreach (Signed)
[place] [facility]) Care Management  12/07/2015  Sue [name] [birth year] [id number]   Assessment-CSW completed outreach to patient on 12/07/15. Patient answered. Patient is aware of her upcoming SCAT assessment appointment on 12/23/15 at 9:00 am. Patient informed SCAT that her daughter can transport her and that she did not need to use their SCAT transportation provided free of charge. CSW questioned if her daughter is still able to transport her at that time. Patient reports that she has not asked her daughter yet if she can transport her but will do so by this week. CSW reminded her that if her daughter is unable to transport her then SCAT could.   Plan-CSW will complete next outreach by 12/11/15 to see if patient needs SCAT transportation arranged for upcoming assessment.  [name], BSW, MSW, [email or web address] Phone: [phone] Fax: [phone]

## 2015-12-08 ENCOUNTER — Other Ambulatory Visit: Payer: Self-pay

## 2015-12-08 NOTE — Patient Outreach (Signed)
[facility]) Care Management  12/08/2015  BRACIE KNEIP 27-May-1951 [id number]   Arrived at patient's home for scheduled home visit. Daughter came to door to advise this RNCM patient just went to bed, had been up all night. Daughter stated she would advise this RNCM she would tell patient I had come by. This RNCM left a call with my contact information for patient to call to reschedule this home visit.  Plan: Make telephone contact with patient on Friday, March 31 if there is not answer to this home visit.

## 2015-12-11 ENCOUNTER — Other Ambulatory Visit: Payer: Self-pay | Admitting: Licensed Clinical Social Worker

## 2015-12-11 ENCOUNTER — Other Ambulatory Visit: Payer: Self-pay

## 2015-12-11 DIAGNOSIS — J449 Chronic obstructive pulmonary disease, unspecified: Secondary | ICD-10-CM | POA: Diagnosis not present

## 2015-12-11 DIAGNOSIS — J9601 Acute respiratory failure with hypoxia: Secondary | ICD-10-CM | POA: Diagnosis not present

## 2015-12-11 NOTE — Patient Outreach (Signed)
[facility]) Care Management  12/11/2015  COLINDA ZYLA [birth year] [id number]   Assessment-CSW completed outreach to patient to see if patient needed SCAT transportation arranged for assessment or if her daughter would be transporting her. Patient did not answer. HIPPA compliant voice message left.  Plan-CSW will complete next outreach by 12/15/15.  [name], BSW, MSW, [email or web address] Phone: [phone] Fax: [phone]

## 2015-12-14 ENCOUNTER — Other Ambulatory Visit: Payer: Self-pay | Admitting: Licensed Clinical Social Worker

## 2015-12-14 NOTE — Patient Outreach (Signed)
[facility]) Care Management  12/14/2015  Sue [name] 12-Jun-1951 [id number]   Assessment- CSW completed outreach to patient. Patient answered. Patient reports that she does not need for SCAT to provide transportation free of charge for scheduled assessment on 12/23/15 at 9:00 am. Patient reports that her daughter will not be working that day and will provide transportation for her. Patient reports that she has appointment on her calendar. Patient denies needing CSW to contact daughter to inform her.  Plan-Patient wishes for CSW to complete reminder call next week. CSW will complete two reminder calls if needed. CSW will complete outreach by 12/21/15.  [name], BSW, MSW, [email or web address] Phone: [phone] Fax: [phone]

## 2015-12-15 ENCOUNTER — Telehealth: Payer: Self-pay

## 2015-12-15 ENCOUNTER — Emergency Department (HOSPITAL_COMMUNITY): Payer: Commercial Managed Care - HMO

## 2015-12-15 ENCOUNTER — Emergency Department (HOSPITAL_COMMUNITY)
Admission: EM | Admit: 2015-12-15 | Discharge: 2015-12-15 | Disposition: A | Discharge status: Home / Self Care | Payer: Commercial Managed Care - HMO | Attending: Emergency Medicine | Admitting: Emergency Medicine

## 2015-12-15 ENCOUNTER — Encounter (HOSPITAL_COMMUNITY): Payer: Self-pay

## 2015-12-15 ENCOUNTER — Ambulatory Visit: Payer: Commercial Managed Care - HMO | Admitting: Family Medicine

## 2015-12-15 ENCOUNTER — Other Ambulatory Visit: Payer: Self-pay

## 2015-12-15 DIAGNOSIS — Z79899 Other long term (current) drug therapy: Secondary | ICD-10-CM | POA: Insufficient documentation

## 2015-12-15 DIAGNOSIS — K219 Gastro-esophageal reflux disease without esophagitis: Secondary | ICD-10-CM | POA: Diagnosis not present

## 2015-12-15 DIAGNOSIS — Z87891 Personal history of nicotine dependence: Secondary | ICD-10-CM | POA: Insufficient documentation

## 2015-12-15 DIAGNOSIS — I10 Essential (primary) hypertension: Secondary | ICD-10-CM | POA: Diagnosis not present

## 2015-12-15 DIAGNOSIS — T424X1A Poisoning by benzodiazepines, accidental (unintentional), initial encounter: Secondary | ICD-10-CM | POA: Insufficient documentation

## 2015-12-15 DIAGNOSIS — Z7982 Long term (current) use of aspirin: Secondary | ICD-10-CM | POA: Diagnosis not present

## 2015-12-15 DIAGNOSIS — R5383 Other fatigue: Secondary | ICD-10-CM | POA: Diagnosis not present

## 2015-12-15 DIAGNOSIS — R4701 Aphasia: Secondary | ICD-10-CM | POA: Diagnosis present

## 2015-12-15 DIAGNOSIS — Z8673 Personal history of transient ischemic attack (TIA), and cerebral infarction without residual deficits: Secondary | ICD-10-CM | POA: Insufficient documentation

## 2015-12-15 DIAGNOSIS — R4781 Slurred speech: Secondary | ICD-10-CM | POA: Diagnosis not present

## 2015-12-15 DIAGNOSIS — R0602 Shortness of breath: Secondary | ICD-10-CM | POA: Insufficient documentation

## 2015-12-15 DIAGNOSIS — J439 Emphysema, unspecified: Secondary | ICD-10-CM | POA: Diagnosis not present

## 2015-12-15 DIAGNOSIS — J441 Chronic obstructive pulmonary disease with (acute) exacerbation: Secondary | ICD-10-CM

## 2015-12-15 HISTORY — DX: Cerebral infarction, unspecified: I63.9

## 2015-12-15 LAB — I-STAT CHEM 8, ED
BUN: 11 mg/dL (ref 6–20)
Calcium, Ion: 1.02 mmol/L — ABNORMAL LOW (ref 1.13–1.30)
Chloride: 100 mmol/L — ABNORMAL LOW (ref 101–111)
Creatinine, Ser: 1.2 mg/dL — ABNORMAL HIGH (ref 0.44–1.00)
Glucose, Bld: 104 mg/dL — ABNORMAL HIGH (ref 65–99)
HEMATOCRIT: 43 % (ref 36.0–46.0)
HEMOGLOBIN: 14.6 g/dL (ref 12.0–15.0)
POTASSIUM: 3.2 mmol/L — AB (ref 3.5–5.1)
Sodium: 143 mmol/L (ref 135–145)
TCO2: 28 mmol/L (ref 0–100)

## 2015-12-15 LAB — COMPREHENSIVE METABOLIC PANEL
ALT: 26 U/L (ref 14–54)
ANION GAP: 10 (ref 5–15)
AST: 67 U/L — ABNORMAL HIGH (ref 15–41)
Albumin: 2.9 g/dL — ABNORMAL LOW (ref 3.5–5.0)
Alkaline Phosphatase: 105 U/L (ref 38–126)
BUN: 10 mg/dL (ref 6–20)
CHLORIDE: 101 mmol/L (ref 101–111)
CO2: 28 mmol/L (ref 22–32)
Calcium: 8.4 mg/dL — ABNORMAL LOW (ref 8.9–10.3)
Creatinine, Ser: 1.26 mg/dL — ABNORMAL HIGH (ref 0.44–1.00)
GFR, EST AFRICAN AMERICAN: 51 mL/min — AB (ref >60)
GFR, EST NON AFRICAN AMERICAN: 44 mL/min — AB (ref >60)
Glucose, Bld: 109 mg/dL — ABNORMAL HIGH (ref 65–99)
POTASSIUM: 3.2 mmol/L — AB (ref 3.5–5.1)
SODIUM: 139 mmol/L (ref 135–145)
Total Bilirubin: 0.7 mg/dL (ref 0.3–1.2)
Total Protein: 8.4 g/dL — ABNORMAL HIGH (ref 6.5–8.1)

## 2015-12-15 LAB — CBC
HCT: 39.6 % (ref 36.0–46.0)
HEMOGLOBIN: 12.8 g/dL (ref 12.0–15.0)
MCH: 31.8 pg (ref 26.0–34.0)
MCHC: 32.3 g/dL (ref 30.0–36.0)
MCV: 98.5 fL (ref 78.0–100.0)
PLATELETS: 157 10*3/uL (ref 150–400)
RBC: 4.02 MIL/uL (ref 3.87–5.11)
RDW: 12.7 % (ref 11.5–15.5)
WBC: 5.3 10*3/uL (ref 4.0–10.5)

## 2015-12-15 LAB — DIFFERENTIAL
BASOS PCT: 0 %
Basophils Absolute: 0 10*3/uL (ref 0.0–0.1)
EOS ABS: 0.2 10*3/uL (ref 0.0–0.7)
EOS PCT: 3 %
Lymphocytes Relative: 55 %
Lymphs Abs: 2.9 10*3/uL (ref 0.7–4.0)
MONO ABS: 0.4 10*3/uL (ref 0.1–1.0)
Monocytes Relative: 7 %
NEUTROS ABS: 1.9 10*3/uL (ref 1.7–7.7)
NEUTROS PCT: 35 %

## 2015-12-15 LAB — I-STAT TROPONIN, ED: TROPONIN I, POC: 0 ng/mL (ref 0.00–0.08)

## 2015-12-15 LAB — CBG MONITORING, ED: GLUCOSE-CAPILLARY: 98 mg/dL (ref 65–99)

## 2015-12-15 LAB — APTT: aPTT: 32 seconds (ref 24–37)

## 2015-12-15 LAB — PROTIME-INR
INR: 1.21 (ref 0.00–1.49)
PROTHROMBIN TIME: 15.5 s — AB (ref 11.6–15.2)

## 2015-12-15 MED ORDER — LORAZEPAM 2 MG PO TABS: 1.0000 mg | ORAL_TABLET | Freq: Every day | ORAL | Status: DC

## 2015-12-15 NOTE — Discharge Instructions (Signed)
Sedative Ingestion An overdose is when more drugs are taken than recommended. The risk of serious problems from overdosing on any sedative depends on the amount of drug taken and whether it is mixed with other drugs or alcohol. The most common group of sedatives are benzodiazepines, including:  Lorazepam.  Flurazepam.  Triazolam.  Chlordiazepoxide.  Oxazepam.  Diazepam.  Alprazolam. Sedatives may be prescribed for insomnia, anxiety, muscle tension, and alcohol or drug withdrawal symptoms. SYMPTOMS A sedative overdose causes symptoms similar to alcohol intoxication. These include:  Loss of coordination.  Slurred speech.  Slowed breathing.  Poor judgment.  Memory loss.  Drowsiness.  Blackouts.  Coma. Taking too many sedatives can cause:  Respiratory depression.  Vomiting.  Dehydration.  Low blood pressure.  Death. HOME CARE INSTRUCTIONS  At this point, hospital care is not needed.  You are at an increased risk for injury when on sedative drugs, especially when you drive or operate machinery. It is very important that someone watches you closely for the next 24-48 hours and calls for emergency help if you have trouble breathing or cannot be awakened from sleep.  You may have a hangover after sedative ingestion. Get plenty of rest and drink increased amounts of non-alcoholic fluids.  A sedative ingestion is often a sign of a severe emotional state or depression. If you have been taking a sedative medicine regularly for a long time and stop suddenly, you may have withdrawal symptoms, including anxiety, agitation, headache, and more serious symptoms. Be sure to see your doctor or counselor for further treatment to address these emotional and physical issues. SEEK IMMEDIATE MEDICAL CARE IF:   You develop recurrent dizziness or weakness or you faint.  You have trouble breathing.  You have a seizure.   This information is not intended to replace advice given to  you by your health care provider. Make sure you discuss any questions you have with your health care provider.  Bedtime Regiment: Take 1mg  of Lorazepam tonight at bedtime, break your 2mg  tablet in half Take 1 tablet of Zolpidem at bedtime Take 2 tablets, 50mg  each of Quetiapine at bedtime Do not use Xanax at bedtime or the [place]isom   Continue to take Xanax as needed during the day for anxiety  Please call Dr. Etter Sjogren tomorrow morning to schedule an appointment within 48 hours of today   Document Released: 10/06/2004 Document Revised: 11/21/2011 Document Reviewed: 02/18/2015 Elsevier Interactive Patient Education 2016 [employer].

## 2015-12-15 NOTE — ED Notes (Signed)
Pt and daughter reports onset 2-3 weeks of slurred speech and problems with memory.  Was seen by [facility] today who referred to ED.  Hand grips equal, smile symmetrical.  A&Ox4.

## 2015-12-15 NOTE — Telephone Encounter (Signed)
Call from [facility] and she stated the patient is different from last month, she said the patient's face is drooping, she is having slurred speech, and she looks different then she did 30 days ago. She said the daughter is concerned that she had a stroke, I discussed with Dr.Lowne who advised the patient should go to the ED for an evaluation, Pam stated that the patient's daughter advised that she had been that was for 2-3 weeks, she is scheduled with Dr.Lowne today at 5:30, however Dr.Lowne does not want her to wait that long. Pam verbalized understanding and will have the patient go to the ED for evaluation for possible stroke.      KP

## 2015-12-15 NOTE — Patient Outreach (Signed)
[facility]) Care Management  12/15/2015  Sue [name] Aug 13, 1951 [id number]    Arrived at patient's home for this home visit for community care coordination. This RNCM was met by daughter who privately updated this RNCM of what she has observed. Daughter describes her mother has to be asked to take a bath, has gone over a week without taking a bath.  Daughter states she has noticed her mother sometimes slur her speech, sometimes droozes from left side of her mouth. Call made to Dr. Etter Sjogren to advise her of the above.  Patient has appointment with Dr. Etter Sjogren today at 430pm, however, doctor stated patient should go to [facility] Emergency Department.  Plan: Follow up with patient and daughter on Friday, April 7.

## 2015-12-15 NOTE — Telephone Encounter (Signed)
noted 

## 2015-12-15 NOTE — ED Provider Notes (Signed)
CSN: [id number]     Arrival date & time 12/15/15  1530 History   First MD Initiated Contact with Patient 12/15/15 1927     Chief Complaint  Patient presents with  . Aphasia     (Consider location/radiation/quality/duration/timing/severity/associated sxs/prior Treatment) HPI Comments: The patient is a 65 y.o. AA females with significant PMHx of HTN, emphysema, depression, and prior stroke in 2015 who presents to the ED with aphasia for 3 weeks. The HHN noticed a change in the patients speech today and suggested she be seen. The patient's daughters were present and provided some of the history. The daughters noticed a change in the patient's speech for the past 3 weeks and noticed a decrease in her cognitive function and her ADL's. The daughter also expressed concern regarding the patients medications and how much the patient is taking. She asked how her meds could be evaluated and perhaps decreased. The patient and family deny and extremity weakness, facial drooping, changes in vision or hearing, dizziness, or syncope.    The history is provided by the patient, a caregiver and a relative.    Past Medical History  Diagnosis Date  . Hypertension   . Emphysema/COPD ([place])   . Depression   . GERD (gastroesophageal reflux disease)   . Urine incontinence   . Stroke [facility]) 2016    TIA    Past Surgical History  Procedure Laterality Date  . Abdominal hysterectomy    . Cesarean section     Family History  Problem Relation Age of Onset  . Heart disease Father     MVP and Pics Valve  . Hypertension Father   . Depression Father     Institutionalized x's 2 years  . Bipolar disorder Father   . Heart disease Paternal Grandmother   . Heart disease Paternal Aunt   . Heart disease Paternal Uncle   . Schizophrenia Paternal Aunt   . Hypertension Sister   . Diabetes Sister   . Hyperlipidemia Sister   . Heart disease Sister 86    MI  . Heart disease Brother   . Hypertension Brother    Social  History  Substance Use Topics  . Smoking status: Former Smoker -- 1.00 packs/day for 40 years    Types: Cigarettes    Quit date: 10/16/2011  . Smokeless tobacco: Never Used  . Alcohol Use: Yes     Comment: Occ-- Wine   OB History    No data available     Review of Systems  Constitutional: Positive for activity change and fatigue. Negative for fever, chills, diaphoresis, appetite change and unexpected weight change.  HENT: Negative.   Eyes: Negative.   Respiratory: Positive for shortness of breath.   Cardiovascular: Positive for leg swelling. Negative for chest pain and palpitations.  Gastrointestinal: Negative.   Endocrine: Positive for cold intolerance.  Genitourinary: Negative for hematuria and difficulty urinating.  Musculoskeletal: Negative.   Skin: Negative for color change, pallor and wound.       dryness  Neurological: Positive for speech difficulty. Negative for dizziness, syncope, facial asymmetry, weakness, light-headedness, numbness and headaches.  Psychiatric/Behavioral: Positive for sleep disturbance. Negative for suicidal ideas, hallucinations, behavioral problems, confusion, self-injury, dysphoric mood, decreased concentration and agitation. The patient is not nervous/anxious and is not hyperactive.       Allergies  Review of patient's allergies indicates no known allergies.  Home Medications   Prior to Admission medications   Medication Sig Start Date End Date Taking? Authorizing Provider  albuterol (PROVENTIL  HFA;VENTOLIN HFA) 108 (90 BASE) MCG/ACT inhaler Inhale 2 puffs into the lungs every 6 (six) hours as needed for wheezing or shortness of breath. 01/29/14   Brunetta Jeans, PA-C  ALPRAZolam Duanne Moron) 0.5 MG tablet TAKE ONE TABLET BY MOUTH TWICE DAILY AS NEEDED FOR ANXIETY 12/01/15   Alferd Apa Lowne Chase, DO  aspirin EC 325 MG EC tablet Take 1 tablet (325 mg total) by mouth daily. 07/30/14   Shanker Kristeen Mans, MD  furosemide (LASIX) 20 MG tablet 3 po qd  10/29/15   Rosalita Chessman Chase, DO  levothyroxine (SYNTHROID, LEVOTHROID) 50 MCG tablet Take 1 tablet (50 mcg total) by mouth daily. 11/03/15   Alferd Apa Lowne Chase, DO  LORazepam (ATIVAN) 2 MG tablet Take 1 tablet (2 mg total) by mouth at bedtime. 10/30/15   Rosalita Chessman Chase, DO  losartan (COZAAR) 100 MG tablet Take 1 tablet (100 mg total) by mouth daily. 10/29/15   Rosalita Chessman Chase, DO  metoprolol succinate (TOPROL-XL) 50 MG 24 hr tablet TAKE TWO TABLETS BY MOUTH IN THE MORNING TAKE  WITH  OR  IMMEDIATELY  FOLLOWING  A  MEAL 10/29/15   Alferd Apa Lowne Chase, DO  nystatin (MYCOSTATIN) 100000 UNIT/ML suspension 5 ml swish and spit qid 10/27/15   Yvonne R Lowne Chase, DO  OXYGEN Inhale 3 L into the lungs continuous.     Historical Provider, MD  pneumococcal 13-valent conjugate vaccine (PREVNAR 13) SUSP injection Inject 0.5 mLs into the muscle once. 01/01/15   Alferd Apa Lowne Chase, DO  Potassium Chloride ER 20 MEQ TBCR Take 20 mEq by mouth daily. Patient taking differently: Take 20 mEq by mouth 2 (two) times daily.  01/01/15   Rosalita Chessman Chase, DO  zolpidem (AMBIEN) 5 MG tablet Take 1 tablet (5 mg total) by mouth at bedtime. 10/30/15   Yvonne R Lowne Chase, DO   BP 105/93 mmHg  Pulse 71  Temp(Src) 98.6 F (37 C) (Oral)  Resp 21  SpO2 100% Physical Exam  Constitutional: She is oriented to person, place, and time. She appears well-developed and well-nourished. No distress.  HENT:  Head: Normocephalic and atraumatic.  Eyes: Conjunctivae and EOM are normal. Pupils are equal, round, and reactive to light.  Cardiovascular: Normal rate and normal heart sounds.   Pulmonary/Chest: Breath sounds normal.  Musculoskeletal: Normal range of motion. She exhibits edema.  Edema of bilaterally lower legs  Neurological: She is alert and oriented to person, place, and time. No cranial nerve deficit. She exhibits normal muscle tone. Coordination normal.  PERRL, EOM intact, cranial nerves grossly intact,  finger to nose intact, sensation intact of all extremities bilaterally, strength 4/5 throughout, slow but appropriate speech.  Skin: Skin is warm and dry. No rash noted. She is not diaphoretic. No erythema.  Psychiatric: She has a normal mood and affect. Her behavior is normal. Thought content normal.    ED Course  Procedures (including critical care time) Labs Review Labs Reviewed  PROTIME-INR - Abnormal; Notable for the following:    Prothrombin Time 15.5 (*)    All other components within normal limits  COMPREHENSIVE METABOLIC PANEL - Abnormal; Notable for the following:    Potassium 3.2 (*)    Glucose, Bld 109 (*)    Creatinine, Ser 1.26 (*)    Calcium 8.4 (*)    Total Protein 8.4 (*)    Albumin 2.9 (*)    AST 67 (*)    GFR calc non Af Amer 44 (*)  GFR calc Af Amer 51 (*)    All other components within normal limits  I-STAT CHEM 8, ED - Abnormal; Notable for the following:    Potassium 3.2 (*)    Chloride 100 (*)    Creatinine, Ser 1.20 (*)    Glucose, Bld 104 (*)    Calcium, Ion 1.02 (*)    All other components within normal limits  APTT  CBC  DIFFERENTIAL  I-STAT TROPOININ, ED  CBG MONITORING, ED    Imaging Review Ct Head Wo Contrast  12/15/2015  CLINICAL DATA:  Slurred speech for 3 weeks.  Progressive memory loss EXAM: CT HEAD WITHOUT CONTRAST TECHNIQUE: Contiguous axial images were obtained from the base of the skull through the vertex without intravenous contrast. COMPARISON:  Head CT July 29, 2014; brain MRI July 30, 2014 FINDINGS: The ventricles are normal in size and configuration. There is a minimal cavum septum pellucidum, an anatomic variant. There is no intracranial mass, hemorrhage, extra-axial fluid collection, or midline shift. There is minimal small vessel disease in the centra semiovale adjacent to the frontal horns of the lateral ventricles, stable. Elsewhere gray-white compartments appear normal. No acute infarct evident. The bony calvarium  appears intact. The mastoid air cells are clear. There is mucosal thickening in the right maxillary antrum. 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 No intraorbital lesions evident. There is leftward deviation of the nasal septum. IMPRESSION: Minimal periventricular small vessel disease. No intracranial mass, hemorrhage, or acute appearing infarct. There is right maxillary sinus disease. 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 Orbital fat extends into this defect, but there is no appreciable deviation of the extraocular muscles. Electronically Signed   By: Lowella Grip III M.D.   On: 12/15/2015 16:22   I have personally reviewed and evaluated these images and lab results as part of my medical decision-making.   EKG Interpretation   Date/Time:  Tuesday December 15 2015 15:38:05 EDT Ventricular Rate:  85 PR Interval:  206 QRS Duration: 94 QT Interval:  394 QTC Calculation: 468 R Axis:   39 Text Interpretation:  Normal sinus rhythm since last tracing no  significant change Confirmed by MILLER  MD, BRIAN ([id number]) on 12/15/2015  8:30:26 PM       MDM   Final diagnoses:  Overdose of benzodiazepine, accidental or unintentional, initial encounter   Patient's presentation, normal CT and lab results are reassuring that this is not intercranial in nature. There is concern about the patient's medication regiment including Xanax, Lorazepam, Ambian, Seriquil, and Unisom all being taken in the evening. This combination of medications and unuintential overdose is likely the reason for the patients aphasia. We discussed this with the patient and the family and have sent a note to the patient's PCP to inform them of our concern. We have given the patient and the family a regiment to follow for this evening until the patient can be seen by or talk to her PCP tomorrow. The patient appears well, with a patent airway and no signs of respiratory distress and  will be discharged home with her family.  I discussed all of the results with the patient and family members they have expressed their understanding to the verbal discharge instructions.  [address], PA-C    [place], [place] 12/16/15 [id number]  Noemi Chapel, MD 12/16/15 [phone]

## 2015-12-15 NOTE — ED Notes (Signed)
Pt stable, ambulatory, states understanding of discharge instructions, family at bedside.

## 2015-12-15 NOTE — ED Provider Notes (Signed)
The patient is a 65 year old female, she is brought in by family secondary to some difficulty with speech which they described as 2-3 weeks of a progressive intermittent slurred speech. The patient on exam has clear speech, she speaks a little on the slow side but has appropriate answers to my questions and an [facility] properly. Of note on medication reconciliation the patient has multiple nighttime medications which are sedatives including over-the-counter Unisom, 10 mg of Ambien, 2 mg of Ativan, Xanax, Seroquel. This is a heavy dose of sedative medication at night and likely contributing to the patient's intermittent difficulty with speech. We have counseled the patient extensively on the use of these medications and I have asked them to follow-up with her family doctor to resolve these medication issues. We will decrease the nighttime dose of Ativan as she will likely need to be tapered off some of the benzodiazepines. The patient and family members are in agreement  Medical screening examination/treatment/procedure(s) were conducted as a shared visit with non-physician practitioner(s) and myself.  I personally evaluated the patient during the encounter.  Clinical Impression:   Final diagnoses:  Overdose of benzodiazepine, accidental or unintentional, initial encounter         Noemi Chapel, MD 12/16/15 [phone]

## 2015-12-16 ENCOUNTER — Telehealth: Payer: Self-pay | Admitting: Family Medicine

## 2015-12-16 NOTE — Telephone Encounter (Signed)
error:[id number] ° °

## 2015-12-18 ENCOUNTER — Ambulatory Visit (INDEPENDENT_AMBULATORY_CARE_PROVIDER_SITE_OTHER): Payer: Commercial Managed Care - HMO | Admitting: Family

## 2015-12-18 ENCOUNTER — Encounter: Payer: Self-pay | Admitting: Family

## 2015-12-18 VITALS — BP 126/66 | HR 80 | Temp 97.7°F | Resp 18 | Ht 63.0 in | Wt 301.2 lb

## 2015-12-18 DIAGNOSIS — R5381 Other malaise: Secondary | ICD-10-CM

## 2015-12-18 DIAGNOSIS — E876 Hypokalemia: Secondary | ICD-10-CM

## 2015-12-18 NOTE — Progress Notes (Signed)
Subjective:    Patient ID: Sue [name], female    DOB: 11-05-1950, 65 y.o.   MRN: [id number]  HPI  Patient presents for evaluation after ED visit for benzodiazepine use. Daughter describes that her mother had had slurred speech for a couple of weeks and family encouraged her mother to go to ED.Daughter describes that her mother had had slurred speech for a couple of weeks and encouraged her mother to go to ED.Patient uses over-the-counter Unisom ( which hasnt taken in several weeks) , 10 mg of Ambien, Xanax 0.5 mg, Seroquel 50 mg. She uses Xanax for sleep and anxiety  and states she uses 3 times per week. In the ED, she was prescribed Ativan however the daughter took away the prescription due to worry that her mother would fill it; she was also confused why they gave her that medication in the first place.   Patient notes depressed mood, loss of interest, and daytime sleepiness.   Patient also describes some changes in memory- notably remembering peoples and names as well incidences getting lost in her own home. Describes instances of people talking and checking the front door when no one is there. Lives with daughter and two teenagers.   Daughter also notes loss of interest and loss of ability or desire to do ADL's since moving in with her. She would like a PT consult.   Follows pyschiatry who prescribes Seroquel for bipolar.  Starting PACE with a Education officer, museum.    ED/Hospital history reviewed in Chart.    Review of Systems  Constitutional: Negative for fever, chills and unexpected weight change.  HENT: Negative for congestion, ear pain, sinus pressure and sore throat.   Eyes: Negative for visual disturbance.  Respiratory: Negative for cough, shortness of breath and wheezing.   Cardiovascular: Negative for chest pain, palpitations and leg swelling.  Gastrointestinal: Negative for nausea and vomiting.  Musculoskeletal: Negative for myalgias and arthralgias.  Skin: Negative for  rash.  Neurological: Negative for headaches.  Hematological: Negative for adenopathy.  Psychiatric/Behavioral: Negative for suicidal ideas, hallucinations, confusion and self-injury.       Memory changes       Objective:   Physical Exam  Constitutional: She is oriented to person, place, and time. Vital signs are normal. She appears well-developed and well-nourished.  Eyes: Conjunctivae are normal.  Cardiovascular: Normal rate, regular rhythm, normal heart sounds and normal pulses.   Pulmonary/Chest: Effort normal and breath sounds normal. She has no wheezes. She has no rhonchi. She has no rales.  Neurological: She is alert and oriented to person, place, and time. She has normal strength. She displays a negative Romberg sign.  Reflex Scores:      Tricep reflexes are 2+ on the right side and 2+ on the left side.      Patellar reflexes are 2+ on the right side and 2+ on the left side. Grip equal and strong bilateral upper extremities. Gait strong. Mildly unsteady and uses wheelchair on occasion.  Able to perform finger-to-nose without difficulty.  Alert to person, place, time, and situation.    Skin: Skin is warm and dry.  Psychiatric: She has a normal mood and affect. Her speech is normal and behavior is normal. Thought content normal.          Assessment & Plan:  1. Hypokalemia On diuretics and potassium low at ED visit. Recheck today.  - Comp Met (CMET)  2. Physical deconditioning - Ambulatory referral to Physical Therapy  3. Stop Benzodiazepines -  Oriented x 3 today. Reassured by normal neuro exam. No acute concerns.   4. Depression/Anxiety Advised to see psychiatry for safe start of possible antidepressant versus PRN use of benzodiazepine. Daughter and patient in agreement with this plan.   5. Memory changes - Advised f/u with PCP for ongoing surveillance.   Start medications as prescribed and explained to patient on After Visit Summary ( AVS).   Education regarding  symptom management and diagnosis given to patient.   Risks, benefits, and alternatives of the medications and treatment plan prescribed today were discussed, and patient expressed understanding.  Plan follow-up as discussed or as needed if any worsening symptoms or change in condition.   Portions of this note may have been constructed with voice recognition software.  Patient and I agreed with plan.

## 2015-12-18 NOTE — Progress Notes (Signed)
Pre visit review using our clinic review tool, if applicable. No additional management support is needed unless otherwise documented below in the visit note. 

## 2015-12-18 NOTE — Patient Instructions (Addendum)
Discontinued Xanax and Ativan.   Get labs on your way out.   As we discussed, the ED wrote a prescription for Ativan- we decided that you would not pick up this prescription or fill it as it is the same drug class as Xanax and will cause an overdose.   We also decided that evaluation for depression/anxiety by psychiatry is best to determine safest medication in context of Seroquel. Please call psychiatry to make an appointment.  Please continue to follow up with PCP regarding ongoing maintenance and memory changes.   Sedative Ingestion An overdose is when more drugs are taken than recommended. The risk of serious problems from overdosing on any sedative depends on the amount of drug taken and whether it is mixed with other drugs or alcohol. The most common group of sedatives are benzodiazepines, including:  Lorazepam.  Flurazepam.  Triazolam.  Chlordiazepoxide.  Oxazepam.  Diazepam.  Alprazolam. Sedatives may be prescribed for insomnia, anxiety, muscle tension, and alcohol or drug withdrawal symptoms. SYMPTOMS A sedative overdose causes symptoms similar to alcohol intoxication. These include:  Loss of coordination.  Slurred speech.  Slowed breathing.  Poor judgment.  Memory loss.  Drowsiness.  Blackouts.  Coma. Taking too many sedatives can cause:  Respiratory depression.  Vomiting.  Dehydration.  Low blood pressure.  Death. HOME CARE INSTRUCTIONS  At this point, hospital care is not needed.  You are at an increased risk for injury when on sedative drugs, especially when you drive or operate machinery. It is very important that someone watches you closely for the next 24-48 hours and calls for emergency help if you have trouble breathing or cannot be awakened from sleep.  You may have a hangover after sedative ingestion. Get plenty of rest and drink increased amounts of non-alcoholic fluids.  A sedative ingestion is often a sign of a severe emotional  state or depression. If you have been taking a sedative medicine regularly for a long time and stop suddenly, you may have withdrawal symptoms, including anxiety, agitation, headache, and more serious symptoms. Be sure to see your doctor or counselor for further treatment to address these emotional and physical issues. SEEK IMMEDIATE MEDICAL CARE IF:   You develop recurrent dizziness or weakness or you faint.  You have trouble breathing.  You have a seizure.   This information is not intended to replace advice given to you by your health care provider. Make sure you discuss any questions you have with your health care provider.   Document Released: 10/06/2004 Document Revised: 11/21/2011 Document Reviewed: 02/18/2015 Elsevier Interactive Patient Education [employer].

## 2015-12-21 ENCOUNTER — Other Ambulatory Visit: Payer: Self-pay | Admitting: Licensed Clinical Social Worker

## 2015-12-21 NOTE — Patient Outreach (Signed)
[facility]) Care Management  12/21/2015  Sue [name] Jan 31, 1951 [id number]   Assessment-CSW completed outreach to patient and patient answered. CSW questioned if she remembered her SCAT appointment on 12/23/15 and she declined. Patient went to ED last week for slurred speech, memory issues, facial drooping and taking too much of her medications. Patient gave the phone to her daughter who states that she would gladly transport patient to SCAT assessment. Address provided. Daughter shares that she has concerns for her mother as her health is declining and she is unable to walk much. Daughter wishes to have a joint home visit with [facility] and CSW.  Plan-CSW will update RNCM.  Sue [name], BSW, MSW, [email or web address] Phone: [phone] Fax: [phone]

## 2015-12-24 ENCOUNTER — Other Ambulatory Visit: Payer: Self-pay | Admitting: Licensed Clinical Social Worker

## 2015-12-24 ENCOUNTER — Telehealth: Payer: Self-pay | Admitting: *Deleted

## 2015-12-24 NOTE — Telephone Encounter (Signed)
Called pt on [phone] (M) (preferred) and LMOM to return call to office regarding below.

## 2015-12-24 NOTE — Telephone Encounter (Signed)
Burnard Hawthorne, FNP  Dorrene German, RN            Hi Sue [name],   Would you call this patient to inquire why he didn't have his CMet (still in system) done??   We wanted to check his potassium...   Have a great long weekend and hope to see you in [facility] soon :)   Joycelyn Schmid

## 2015-12-24 NOTE — Patient Outreach (Signed)
[name]) Care Management  12/24/2015  Sue [name] 1951-08-24 [id number]   Assessment-CSW completed outreach to both patient and patient's daughter Sue [name] on 12/24/15 but was unable to reach either. CSW left them both HIPPA compliant voice message encouraging a return call.  Plan-CSW will await to hear back from patient and daughter.  Sue [name], BSW, MSW, [email or web address] Phone: [phone] Fax: [phone]

## 2015-12-28 ENCOUNTER — Other Ambulatory Visit: Payer: Self-pay | Admitting: Licensed Clinical Social Worker

## 2015-12-28 NOTE — Patient Outreach (Signed)
[facility]) Care Management  12/28/2015  Sue [name] March 30, 1951 [id number]   Assessment-CSW completed outreach to patient's daughter. Daughter answers. She states that patient was unable to complete SCAT assessment stating "It was just a mess. It was a lot of miscommunication but we rescheduled it for tomorrow I believe. I need to double check my notes though but I will be taking her there." Daughter is agreeable to a home visit for either Wednesday or Thursday of this week. CSW completed outreach to [facility]. RNCM is agreeable to home visit on 4/19/117 at 1:00 pm.  Plan-CSW will complete home visit on 12/30/15.  [name], BSW, MSW, [email or web address] Phone: [phone] Fax: [phone]

## 2015-12-29 NOTE — Telephone Encounter (Signed)
Called pt. She will plan to return to clinic tomorrow to complete labs.

## 2015-12-30 ENCOUNTER — Ambulatory Visit: Payer: Commercial Managed Care - HMO | Admitting: Physical Therapy

## 2015-12-30 ENCOUNTER — Other Ambulatory Visit: Payer: Self-pay

## 2015-12-30 ENCOUNTER — Other Ambulatory Visit: Payer: Self-pay | Admitting: Licensed Clinical Social Worker

## 2015-12-30 NOTE — Patient Outreach (Addendum)
[facility]) Care Management  12/30/2015  ASTIN WAGGONER 15-Mar-1951 [id number]   Home visit in collaboration with [name], LCSW for community care coordination. Patient and daughter present during this visit. Patient consented to daughter being present during this home visit.  Daughter asked for information on community resources for patient. Daughter describes patient as being pretty much sedentary most of the day, watching TV. Daughter also stated patient has been eating very little lately.  Patient agreed. Patient states she just does not have an appetite.   Patient stated she is willing to participate in the PACE Program. Patient was previously referred by this RN CM, however, she declined services at that time because she was told there would be a fee assessed.  Patient and daughter have now reconsidering the PACE option.    Patient also has agreed to the following  [employer], advised she has 6 round trips and 12 one way trips. Has PT appointment for evaluation and treatment.  Plan: Telephone contact in 2 weeks with LCSW to follow up with referrals

## 2015-12-30 NOTE — Patient Outreach (Signed)
[facility]) Care Management  [facility] Social Work  12/30/2015  Sue [name] November 07, 1950 [id number]   Encounter Medications:  Outpatient Encounter Prescriptions as of 12/30/2015  Medication Sig Note  . albuterol (PROVENTIL HFA;VENTOLIN HFA) 108 (90 BASE) MCG/ACT inhaler Inhale 2 puffs into the lungs every 6 (six) hours as needed for wheezing or shortness of breath.   Marland Kitchen aspirin EC 325 MG EC tablet Take 1 tablet (325 mg total) by mouth daily. (Patient not taking: Reported on 12/18/2015)   . doxylamine, Sleep, (UNISOM) 25 MG tablet Take 100 mg by mouth at bedtime as needed for sleep.   . furosemide (LASIX) 20 MG tablet 3 po qd (Patient taking differently: Take 60 mg by mouth daily. 3 po qd)   . levothyroxine (SYNTHROID, LEVOTHROID) 50 MCG tablet Take 1 tablet (50 mcg total) by mouth daily.   Marland Kitchen losartan (COZAAR) 100 MG tablet Take 1 tablet (100 mg total) by mouth daily. (Patient not taking: Reported on 12/18/2015)   . metoprolol succinate (TOPROL-XL) 50 MG 24 hr tablet TAKE TWO TABLETS BY MOUTH IN THE MORNING TAKE  WITH  OR  IMMEDIATELY  FOLLOWING  A  MEAL   . nystatin (MYCOSTATIN) 100000 UNIT/ML suspension 5 ml swish and spit qid   . OXYGEN Inhale 3 L into the lungs continuous.    . pneumococcal 13-valent conjugate vaccine (PREVNAR 13) SUSP injection Inject 0.5 mLs into the muscle once. (Patient not taking: Reported on 12/18/2015)   . Potassium Chloride ER 20 MEQ TBCR Take 20 mEq by mouth daily.   . QUEtiapine (SEROQUEL) 50 MG tablet Take 2 tablets by mouth at bedtime. 12/15/2015: Received from: External Pharmacy Received Sig: TK 2 TS PO HS PRN  . zolpidem (AMBIEN) 5 MG tablet Take 1 tablet (5 mg total) by mouth at bedtime.    No facility-administered encounter medications on file as of 12/30/2015.    Functional Status:  In your present state of health, do you have any difficulty performing the following activities: 12/30/2015 11/19/2015  Hearing? Y N  Vision? Y Y  Difficulty  concentrating or making decisions? [name]  Walking or climbing stairs? Y Y  Dressing or bathing? Y Y  Doing errands, shopping? [name]  Preparing Food and eating ? - Y  Using the Toilet? - N  In the past six months, have you accidently leaked urine? - N  Do you have problems with loss of bowel control? - N  Managing your Medications? - N  Managing your Finances? - N  Housekeeping or managing your Housekeeping? - Y    Fall/Depression Screening:  PHQ 2/9 Scores 11/23/2015 11/20/2015 11/19/2015  PHQ - 2 Score 0 1 1    Assessment: CSW completed joint home visit with RNCM on 12/30/15. Patient and patient's daughter were present during visit. Daughter requested that [name] and CSW complete home visit in order to discuss possible day programs that could benefit patient.   Daughter has been patient's primary caregiver and has become more overwhelmed recently due to patient's health decline. Daughter shares that SCAT appointment has been rescheduled for 01/05/16 at 11 am. Patient reports that she has went a few days without eating (daughter cooks all meals for patient but patient declined due to no appetite.) Patient denied wising to use nutritional supplies due to a fear of gaining weight. RNCM educated patient on healthy eating. CSW provided caretaker resources to daughter which include information on caregiver support groups and a six week education series "Powerful Tools  for Caregivers" at [facility]. RNCM previously made referral to [facility] but patient declined services due to cost. Family is now in the [facility] program and would like more information. CSW spent time educating family on the program and the benefits of their program. Patient's health has declined and she often stays in her home watching television or sleeping throughout the day. Daughter reports that patient has isolated herself for 2 years now in their home. Patient is unable to do things for herself and has extreme difficulty walking. Patient has no medical  equipment. Patient shares that she initially denied many services because she was in denial about needing helping and not being able to care for herself any longer. Patient reports that her health concerns have increased over the last two years. Patient is now willing to participate in a day program. CSW provided family hands outs on [facility] and [facility] and their contact information. Daughter plans to contact [facility] in order to gain an assessment. Daughter also plans to reschedule physical therapist appointment.   CSW spent a lot of time expressing concerns to patient as she has become more comfortable staying at home and not moving or getting out of the house. Daughter reports that she is fearful that patient will become overwhelmed with [facility] and will discontinue services. CSW spent time providing motivation interviewing intervention.  Patient's goals are to lose weight, improve health and socialize more. CSW reviewed activity calendar for April of 2017 with [facility] and patient was interested in several activities.   Plan: CSW will follow up with patient within two weeks. CSW will route encounter to PCP.  [name], BSW, MSW, [name] Phone: [phone] Fax: [phone]

## 2015-12-31 NOTE — Telephone Encounter (Signed)
Called pt to follow up, as she did not have labs drawn yesterday as planned. She states her daughter will bring her in tomorrow morning to have lab work done. Lab appt scheduled.

## 2016-01-01 ENCOUNTER — Other Ambulatory Visit: Payer: Commercial Managed Care - HMO

## 2016-01-01 ENCOUNTER — Other Ambulatory Visit: Payer: Self-pay | Admitting: Pharmacist

## 2016-01-01 NOTE — Patient Outreach (Signed)
[place] [facility]) Care Management  01/01/2016  MELANIE GOWING January 08, 1951 [id number]   Pharmacist attempted to reach patient to follow-up referral from [place] for medication assistance and using mail order pharmacy as patient has not reached back out to pharmacist to date.    No answer, left a HIPAA compliant message.  Plan:  Will attempt to reach patient again next week.   Karrie Meres, PharmD, [place] [phone]

## 2016-01-05 NOTE — Telephone Encounter (Signed)
Called pt to schedule follow up appt w/ Dr. Carollee Herter for lab work and follow up. She plans to have her daughter call the office this afternoon to schedule an appointment.

## 2016-01-06 NOTE — Telephone Encounter (Signed)
LMOM for patient's daughter, [name], to call office to schedule follow-up w/ Dr. Carollee Herter as pt still has not made appt.

## 2016-01-07 ENCOUNTER — Other Ambulatory Visit: Payer: Self-pay | Admitting: Family Medicine

## 2016-01-07 NOTE — Telephone Encounter (Signed)
Appt scheduled 01/15/16 w/ Dr. Carollee Herter.

## 2016-01-10 DIAGNOSIS — J449 Chronic obstructive pulmonary disease, unspecified: Secondary | ICD-10-CM | POA: Diagnosis not present

## 2016-01-10 DIAGNOSIS — J9601 Acute respiratory failure with hypoxia: Secondary | ICD-10-CM | POA: Diagnosis not present

## 2016-01-11 ENCOUNTER — Other Ambulatory Visit: Payer: Self-pay | Admitting: Licensed Clinical Social Worker

## 2016-01-11 ENCOUNTER — Other Ambulatory Visit: Payer: Self-pay | Admitting: Pharmacist

## 2016-01-11 NOTE — Patient Outreach (Signed)
[facility]ue [name]) Care Management  01/11/2016  Sue [name] [birth year] [id number]   Assessment- CSW completed outreach to patient on 01/11/16 to follow up on resources and SCAT assessment. Patient answered and stated that she did not make it to SCAT appointment but cannot remember why or if they rescheduled appointment. CSW completed call to patient's daughter to gain further information but was unable to reach her. HIPPA compliant voice message left.  Plan-CSW will await return to from daughter or complete additional outreach within seven days if no return call has been made.  [name], BSW, MSW, [email or web address] Phone: [phone] Fax: [phone]

## 2016-01-11 NOTE — Patient Outreach (Signed)
[facility]) Care Management  [place]   01/11/2016  KEALI ANTONELLIS 21-Aug-1951 [id number]  Subjective:  Ms Hatz was initially referred to [place] by Debarah Crape [facility] RN Telephonic regarding trouble getting to the pharmacy to get prescriptions and questions about using mail order for cost savings.    [facility] Pharmacist had made multiple attempts to reach patient and patient previously requested to wait until she had some follow-up appointments with her providers.   [facility] Pharmacist was able to reach patient 01/11/16 via phone, patient verified name and date of birth.    Patient reported that at this time she wasn't interested in trying to switch her prescriptions to mail order as she reports she may be moving.  She did not wish to review her medications at time of call either.    Objective:   Current Medications: Current Outpatient Prescriptions  Medication Sig Dispense Refill  . albuterol (PROVENTIL HFA;VENTOLIN HFA) 108 (90 BASE) MCG/ACT inhaler Inhale 2 puffs into the lungs every 6 (six) hours as needed for wheezing or shortness of breath. 1 Inhaler 0  . aspirin EC 325 MG EC tablet Take 1 tablet (325 mg total) by mouth daily. (Patient not taking: Reported on 12/18/2015) 60 tablet 0  . doxylamine, Sleep, (UNISOM) 25 MG tablet Take 100 mg by mouth at bedtime as needed for sleep.    . furosemide (LASIX) 20 MG tablet 3 po qd (Patient taking differently: Take 60 mg by mouth daily. 3 po qd) 270 tablet 1  . levothyroxine (SYNTHROID, LEVOTHROID) 50 MCG tablet Take 1 tablet (50 mcg total) by mouth daily. 30 tablet 2  . losartan (COZAAR) 100 MG tablet Take 1 tablet (100 mg total) by mouth daily. (Patient not taking: Reported on 12/18/2015) 90 tablet 1  . metoprolol succinate (TOPROL-XL) 50 MG 24 hr tablet TAKE TWO TABLETS BY MOUTH IN THE MORNING TAKE  WITH  OR  IMMEDIATELY  FOLLOWING  A  MEAL 180 tablet 1  . nystatin (MYCOSTATIN) 100000 UNIT/ML suspension 5 ml swish and spit  qid 60 mL 0  . OXYGEN Inhale 3 L into the lungs continuous.     . pneumococcal 13-valent conjugate vaccine (PREVNAR 13) SUSP injection Inject 0.5 mLs into the muscle once. (Patient not taking: Reported on 12/18/2015) 1 Syringe 0  . Potassium Chloride ER 20 MEQ TBCR TAKE ONE TABLET BY MOUTH ONCE DAILY 30 tablet 5  . QUEtiapine (SEROQUEL) 50 MG tablet Take 2 tablets by mouth at bedtime.  3  . zolpidem (AMBIEN) 5 MG tablet Take 1 tablet (5 mg total) by mouth at bedtime. 30 tablet 0   No current facility-administered medications for this visit.    Functional Status: In your present state of health, do you have any difficulty performing the following activities: 12/30/2015 11/19/2015  Hearing? Y N  Vision? Y Y  Difficulty concentrating or making decisions? [name]  Walking or climbing stairs? Y Y  Dressing or bathing? Y Y  Doing errands, shopping? [name]  Preparing Food and eating ? Y Y  Using the Toilet? N N  In the past six months, have you accidently leaked urine? N N  Do you have problems with loss of bowel control? N N  Managing your Medications? N N  Managing your Finances? N N  Housekeeping or managing your Housekeeping? [name]    Fall/Depression Screening: PHQ 2/9 Scores 11/23/2015 11/20/2015 11/19/2015  PHQ - 2 Score 0 1 1    Assessment:  Medication  assistance:  Patient declined pharmacy services at this time.  Patient does have pharmacist phone number should she wish to revisit pharmacy cost savings options in the future.  Plan:  1) Will close out pharmacy case at this time as patient denied pharmacy questions or concerns at this time.  Patient has [facility] Pharmacist phone number should she be interested in discussing cost savings options in the future.   2) Will update other [facility] team members involved in patient's care, Pam, RN and Jerene Pitch, LCSW of pharmacy case closure.   Karrie Meres, PharmD, [place] [phone]

## 2016-01-15 ENCOUNTER — Ambulatory Visit (INDEPENDENT_AMBULATORY_CARE_PROVIDER_SITE_OTHER): Payer: Commercial Managed Care - HMO | Admitting: Family Medicine

## 2016-01-15 ENCOUNTER — Encounter: Payer: Self-pay | Admitting: Family Medicine

## 2016-01-15 VITALS — BP 116/82 | HR 83 | Temp 98.1°F | Ht 63.0 in | Wt 299.8 lb

## 2016-01-15 DIAGNOSIS — Z1159 Encounter for screening for other viral diseases: Secondary | ICD-10-CM | POA: Diagnosis not present

## 2016-01-15 DIAGNOSIS — R5383 Other fatigue: Secondary | ICD-10-CM | POA: Diagnosis not present

## 2016-01-15 DIAGNOSIS — E039 Hypothyroidism, unspecified: Secondary | ICD-10-CM | POA: Diagnosis not present

## 2016-01-15 DIAGNOSIS — R5381 Other malaise: Secondary | ICD-10-CM

## 2016-01-15 DIAGNOSIS — E559 Vitamin D deficiency, unspecified: Secondary | ICD-10-CM | POA: Diagnosis not present

## 2016-01-15 LAB — COMPREHENSIVE METABOLIC PANEL
ALT: 20 U/L (ref 0–35)
AST: 49 U/L — ABNORMAL HIGH (ref 0–37)
Albumin: 3 g/dL — ABNORMAL LOW (ref 3.5–5.2)
Alkaline Phosphatase: 105 U/L (ref 39–117)
BUN: 13 mg/dL (ref 6–23)
CHLORIDE: 99 meq/L (ref 96–112)
CO2: 33 meq/L — AB (ref 19–32)
Calcium: 9.3 mg/dL (ref 8.4–10.5)
Creatinine, Ser: 1.22 mg/dL — ABNORMAL HIGH (ref 0.40–1.20)
GFR: 57.16 mL/min — ABNORMAL LOW (ref >60.00)
GLUCOSE: 145 mg/dL — AB (ref 70–99)
POTASSIUM: 3.3 meq/L — AB (ref 3.5–5.1)
SODIUM: 137 meq/L (ref 135–145)
TOTAL PROTEIN: 8.4 g/dL — AB (ref 6.0–8.3)
Total Bilirubin: 0.6 mg/dL (ref 0.2–1.2)

## 2016-01-15 LAB — LIPID PANEL
CHOLESTEROL: 98 mg/dL (ref 0–200)
HDL: 22.6 mg/dL — ABNORMAL LOW (ref >39.00)
LDL Cholesterol: 62 mg/dL (ref 0–99)
NonHDL: 75.52
Total CHOL/HDL Ratio: 4
Triglycerides: 66 mg/dL (ref 0.0–149.0)
VLDL: 13.2 mg/dL (ref 0.0–40.0)

## 2016-01-15 LAB — CBC WITH DIFFERENTIAL/PLATELET
Basophils Absolute: 0 10*3/uL (ref 0.0–0.1)
Basophils Relative: 0.4 % (ref 0.0–3.0)
Eosinophils Absolute: 0.2 10*3/uL (ref 0.0–0.7)
Eosinophils Relative: 3.1 % (ref 0.0–5.0)
HCT: 36.9 % (ref 36.0–46.0)
Hemoglobin: 12.6 g/dL (ref 12.0–15.0)
Lymphocytes Relative: 48 % — ABNORMAL HIGH (ref 12.0–46.0)
Lymphs Abs: 2.4 10*3/uL (ref 0.7–4.0)
MCHC: 34.1 g/dL (ref 30.0–36.0)
MCV: 96.6 fl (ref 78.0–100.0)
Monocytes Absolute: 0.4 10*3/uL (ref 0.1–1.0)
Monocytes Relative: 7.2 % (ref 3.0–12.0)
Neutro Abs: 2 10*3/uL (ref 1.4–7.7)
Neutrophils Relative %: 41.3 % — ABNORMAL LOW (ref 43.0–77.0)
Platelets: 181 10*3/uL (ref 150.0–400.0)
RBC: 3.82 Mil/uL — ABNORMAL LOW (ref 3.87–5.11)
RDW: 13.5 % (ref 11.5–15.5)
WBC: 4.9 10*3/uL (ref 4.0–10.5)

## 2016-01-15 LAB — VITAMIN B12: Vitamin B-12: 615 pg/mL (ref 211–911)

## 2016-01-15 LAB — TSH: TSH: 4.41 u[IU]/mL (ref 0.35–4.50)

## 2016-01-15 NOTE — Progress Notes (Signed)
Pre visit review using our clinic review tool, if applicable. No additional management support is needed unless otherwise documented below in the visit note. 

## 2016-01-15 NOTE — Patient Instructions (Signed)
Fatigue  Fatigue is feeling tired all of the time, a lack of energy, or a lack of motivation. Occasional or mild fatigue is often a normal response to activity or life in general. However, long-lasting (chronic) or extreme fatigue may indicate an underlying medical condition.  HOME CARE INSTRUCTIONS   Watch your fatigue for any changes. The following actions may help to lessen any discomfort you are feeling:  · Talk to your health care provider about how much sleep you need each night. Try to get the required amount every night.  · Take medicines only as directed by your health care provider.  · Eat a healthy and nutritious diet. Ask your health care provider if you need help changing your diet.  · Drink enough fluid to keep your urine clear or pale yellow.  · Practice ways of relaxing, such as yoga, meditation, massage therapy, or acupuncture.  · Exercise regularly.    · Change situations that cause you stress. Try to keep your work and personal routine reasonable.  · Do not abuse illegal drugs.  · Limit alcohol intake to no more than 1 drink per day for nonpregnant women and 2 drinks per day for men. One drink equals 12 ounces of beer, 5 ounces of wine, or 1½ ounces of hard liquor.  · Take a multivitamin, if directed by your health care provider.  SEEK MEDICAL CARE IF:   · Your fatigue does not get better.  · You have a fever.    · You have unintentional weight loss or gain.  · You have headaches.    · You have difficulty:      Falling asleep.    Sleeping throughout the night.  · You feel angry, guilty, anxious, or sad.     · You are unable to have a bowel movement (constipation).    · You skin is dry.     · Your legs or another part of your body is swollen.    SEEK IMMEDIATE MEDICAL CARE IF:   · You feel confused.    · Your vision is blurry.  · You feel faint or pass out.    · You have a severe headache.    · You have severe abdominal, pelvic, or back pain.    · You have chest pain, shortness of breath, or an  irregular or fast heartbeat.    · You are unable to urinate or you urinate less than normal.    · You develop abnormal bleeding, such as bleeding from the rectum, vagina, nose, lungs, or nipples.  · You vomit blood.     · You have thoughts about harming yourself or committing suicide.    · You are worried that you might harm someone else.       This information is not intended to replace advice given to you by your health care provider. Make sure you discuss any questions you have with your health care provider.     Document Released: 06/26/2007 Document Revised: 09/19/2014 Document Reviewed: 12/31/2013  Elsevier Interactive Patient Education ©2016 Elsevier Inc.

## 2016-01-15 NOTE — Progress Notes (Signed)
Patient ID: Sue [name], female    DOB: Jun 14, 1951  Age: 65 y.o. MRN: [id number]    Subjective:  Subjective HPI GEORGENIA [name] presents for hosp f/u.  Her daughter is with her.  She is requesting PT -- her daughter states she is not doing any ADLs .      Review of Systems  Constitutional: Negative for diaphoresis, appetite change, fatigue and unexpected weight change.  Eyes: Negative for pain, redness and visual disturbance.  Respiratory: Negative for cough, chest tightness, shortness of breath and wheezing.   Cardiovascular: Negative for chest pain, palpitations and leg swelling.  Endocrine: Negative for cold intolerance, heat intolerance, polydipsia, polyphagia and polyuria.  Genitourinary: Negative for dysuria, frequency and difficulty urinating.  Neurological: Positive for weakness. Negative for dizziness, light-headedness, numbness and headaches.    History Past Medical History  Diagnosis Date  . Hypertension   . Emphysema/COPD ([place])   . Depression   . GERD (gastroesophageal reflux disease)   . Urine incontinence   . Stroke [facility]) 2016    TIA     She has past surgical history that includes Abdominal hysterectomy and Cesarean section.   Her family history includes Bipolar disorder in her father; Depression in her father; Diabetes in her sister; Heart disease in her brother, father, paternal aunt, paternal grandmother, and paternal uncle; Heart disease (age of onset: 47) in her sister; Hyperlipidemia in her sister; Hypertension in her brother, father, and sister; Schizophrenia in her paternal aunt.She reports that she quit smoking about 4 years ago. Her smoking use included Cigarettes. She has a 40 pack-year smoking history. She has never used smokeless tobacco. She reports that she drinks alcohol. She reports that she does not use illicit drugs.  Current Outpatient Prescriptions on File Prior to Visit  Medication Sig Dispense Refill  . albuterol (PROVENTIL HFA;VENTOLIN HFA)  108 (90 BASE) MCG/ACT inhaler Inhale 2 puffs into the lungs every 6 (six) hours as needed for wheezing or shortness of breath. 1 Inhaler 0  . aspirin EC 325 MG EC tablet Take 1 tablet (325 mg total) by mouth daily. 60 tablet 0  . doxylamine, Sleep, (UNISOM) 25 MG tablet Take 100 mg by mouth at bedtime as needed for sleep.    . furosemide (LASIX) 20 MG tablet 3 po qd (Patient taking differently: Take 60 mg by mouth daily. 3 po qd) 270 tablet 1  . levothyroxine (SYNTHROID, LEVOTHROID) 50 MCG tablet Take 1 tablet (50 mcg total) by mouth daily. 30 tablet 2  . metoprolol succinate (TOPROL-XL) 50 MG 24 hr tablet TAKE TWO TABLETS BY MOUTH IN THE MORNING TAKE  WITH  OR  IMMEDIATELY  FOLLOWING  A  MEAL 180 tablet 1  . nystatin (MYCOSTATIN) 100000 UNIT/ML suspension 5 ml swish and spit qid 60 mL 0  . OXYGEN Inhale 3 L into the lungs continuous.     . Potassium Chloride ER 20 MEQ TBCR TAKE ONE TABLET BY MOUTH ONCE DAILY 30 tablet 5  . QUEtiapine (SEROQUEL) 50 MG tablet Take 2 tablets by mouth at bedtime.  3  . zolpidem (AMBIEN) 5 MG tablet Take 1 tablet (5 mg total) by mouth at bedtime. 30 tablet 0   No current facility-administered medications on file prior to visit.     Objective:  Objective Physical Exam  Constitutional: She is oriented to person, place, and time. She appears well-developed and well-nourished.  Pt in wheelchair on O2  HENT:  Head: Normocephalic and atraumatic.  Eyes: Conjunctivae and EOM  are normal.  Neck: Normal range of motion. Neck supple. No JVD present. Carotid bruit is not present. No thyromegaly present.  Cardiovascular: Normal rate, regular rhythm and normal heart sounds.   No murmur heard. Pulmonary/Chest: Effort normal. No respiratory distress. She has wheezes. She has no rales. She exhibits no tenderness.  Musculoskeletal: She exhibits edema.  Neurological: She is alert and oriented to person, place, and time.  Psychiatric: She has a normal mood and affect. Her  behavior is normal.  Nursing note and vitals reviewed.  BP 116/82 mmHg  Pulse 83  Temp(Src) 98.1 F (36.7 C) (Oral)  Ht 5\' 3"  (1.6 m)  Wt 299 lb 12.8 oz (135.988 kg)  BMI 53.12 kg/m2  SpO2 93% Wt Readings from Last 3 Encounters:  01/15/16 299 lb 12.8 oz (135.988 kg)  12/18/15 301 lb 3.2 oz (136.623 kg)  10/27/15 302 lb 12.8 oz (137.349 kg)     Lab Results  Component Value Date   WBC 4.9 01/15/2016   HGB 12.6 01/15/2016   HCT 36.9 01/15/2016   PLT 181.0 01/15/2016   GLUCOSE 145* 01/15/2016   CHOL 98 01/15/2016   TRIG 66.0 01/15/2016   HDL 22.60* 01/15/2016   LDLCALC 62 01/15/2016   ALT 20 01/15/2016   AST 49* 01/15/2016   NA 137 01/15/2016   K 3.3* 01/15/2016   CL 99 01/15/2016   CREATININE 1.22* 01/15/2016   BUN 13 01/15/2016   CO2 33* 01/15/2016   TSH 4.41 01/15/2016   INR 1.21 12/15/2015   HGBA1C 5.8 10/27/2015    Ct Head Wo Contrast  12/15/2015  CLINICAL DATA:  Slurred speech for 3 weeks.  Progressive memory loss EXAM: CT HEAD WITHOUT CONTRAST TECHNIQUE: Contiguous axial images were obtained from the base of the skull through the vertex without intravenous contrast. COMPARISON:  Head CT July 29, 2014; brain MRI July 30, 2014 FINDINGS: The ventricles are normal in size and configuration. There is a minimal cavum septum pellucidum, an anatomic variant. There is no intracranial mass, hemorrhage, extra-axial fluid collection, or midline shift. There is minimal small vessel disease in the centra semiovale adjacent to the frontal horns of the lateral ventricles, stable. Elsewhere gray-white compartments appear normal. No acute infarct evident. The bony calvarium appears intact. The mastoid air cells are clear. There is mucosal thickening in the right maxillary antrum. 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 No intraorbital lesions evident. There is leftward deviation of the nasal septum. IMPRESSION: Minimal periventricular small vessel  disease. No intracranial mass, hemorrhage, or acute appearing infarct. There is right maxillary sinus disease. 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 Orbital fat extends into this defect, but there is no appreciable deviation of the extraocular muscles. Electronically Signed   By: Lowella Grip III M.D.   On: 12/15/2015 16:22     Assessment & Plan:  Plan I have discontinued Ms. Carrender's pneumococcal 13-valent conjugate vaccine and losartan. I am also having her maintain her albuterol, OXYGEN, aspirin, nystatin, metoprolol succinate, furosemide, zolpidem, levothyroxine, QUEtiapine, doxylamine (Sleep), and Potassium Chloride ER.  No orders of the defined types were placed in this encounter.    Problem List Items Addressed This Visit    None    Visit Diagnoses    Physical deconditioning    -  Primary    Relevant Orders    Ambulatory referral to Physical Therapy    Comprehensive metabolic panel (Completed)    CBC with Differential/Platelet (  Completed)    Lipid panel (Completed)    Vitamin B12 (Completed)    Vitamin D 1,25 dihydroxy (Completed)    POCT urinalysis dipstick    Other fatigue        Relevant Orders    Comprehensive metabolic panel (Completed)    CBC with Differential/Platelet (Completed)    Lipid panel (Completed)    Vitamin B12 (Completed)    Vitamin D 1,25 dihydroxy (Completed)    POCT urinalysis dipstick    Hypothyroidism, unspecified hypothyroidism type        Relevant Orders    TSH (Completed)    Need for hepatitis C screening test        Relevant Orders    Hepatitis C antibody (Completed)       Follow-up: Return in about 3 months (around 04/16/2016), or if symptoms worsen or fail to improve.  Ann Held, DO

## 2016-01-16 LAB — HEPATITIS C ANTIBODY: HCV AB: REACTIVE — AB

## 2016-01-18 ENCOUNTER — Other Ambulatory Visit: Payer: Self-pay | Admitting: Licensed Clinical Social Worker

## 2016-01-18 LAB — VITAMIN D 1,25 DIHYDROXY
Vitamin D 1, 25 (OH)2 Total: 16 pg/mL — ABNORMAL LOW (ref 18–72)
Vitamin D2 1, 25 (OH)2: <8 pg/mL
Vitamin D3 1, 25 (OH)2: 16 pg/mL

## 2016-01-18 NOTE — Patient Outreach (Signed)
[facility]) Care Management  01/18/2016  Sue [name] 12/07/1950 [id number]   Assessment- CSW completed outreach to patient's daughter and caregiver but was unable to reach her. HIPPA compliant voice message left encouraging her to return call once available.  Plan-CSW will await for return call or complete additional outreach within 7 days.  [name], BSW, MSW, [email or web address] Phone: [phone] Fax: [phone]

## 2016-01-19 ENCOUNTER — Ambulatory Visit: Payer: Commercial Managed Care - HMO | Admitting: Internal Medicine

## 2016-01-20 ENCOUNTER — Telehealth: Payer: Self-pay | Admitting: *Deleted

## 2016-01-20 LAB — HEPATITIS C RNA QUANTITATIVE

## 2016-01-20 NOTE — Telephone Encounter (Signed)
[facility] called -- not enough blood to run hep C RNA . Pt notified will call to schedule appointment .

## 2016-01-25 ENCOUNTER — Other Ambulatory Visit: Payer: Self-pay | Admitting: Licensed Clinical Social Worker

## 2016-01-25 NOTE — Patient Outreach (Signed)
[place] [facility]) Care Management  01/25/2016  Sue [name] Apr 02, 1951 [id number]   Assessment-CSW completed outreach attempt today. CSW unable to reach patient successfully. CSW left a HIPPA compliant voice message encouraging patient to return call once available.  Plan-CSW will await return call or complete an additional outreach if needed.  [name], BSW, MSW, [email or web address] Phone: [phone] Fax: [phone]

## 2016-01-26 ENCOUNTER — Ambulatory Visit: Payer: Commercial Managed Care - HMO | Attending: Family Medicine | Admitting: Physical Therapy

## 2016-02-01 ENCOUNTER — Other Ambulatory Visit: Payer: Self-pay | Admitting: *Deleted

## 2016-02-01 ENCOUNTER — Telehealth: Payer: Self-pay | Admitting: Family Medicine

## 2016-02-01 DIAGNOSIS — E559 Vitamin D deficiency, unspecified: Secondary | ICD-10-CM

## 2016-02-01 DIAGNOSIS — R739 Hyperglycemia, unspecified: Secondary | ICD-10-CM

## 2016-02-01 DIAGNOSIS — Z1159 Encounter for screening for other viral diseases: Secondary | ICD-10-CM

## 2016-02-01 MED ORDER — POTASSIUM CHLORIDE ER 20 MEQ PO TBCR: 1.0000 | EXTENDED_RELEASE_TABLET | Freq: Two times a day (BID) | ORAL | Status: DC

## 2016-02-01 MED ORDER — VITAMIN D (ERGOCALCIFEROL) 1.25 MG (50000 UNIT) PO CAPS: 50000.0000 [IU] | ORAL_CAPSULE | ORAL | Status: DC

## 2016-02-01 NOTE — Telephone Encounter (Signed)
Caller name: Self  Can be reached: [phone]     Reason for call:Request refill on Xanax 0.5 mg called in with the other 2 rx's

## 2016-02-02 ENCOUNTER — Other Ambulatory Visit: Payer: Self-pay | Admitting: Licensed Clinical Social Worker

## 2016-02-02 NOTE — Patient Outreach (Signed)
[facility]) Care Management  02/02/2016  EERA BUTTER 20-Apr-1951 [id number]   Assessment-CSW received voice message from patient's daughter. CSW completed outreach back to daughter and was able to successfully reach her. Daughter states that she has been sick the past 2-3 weeks and has not made any outreaches to [facility] because she has not been well and out of work. Daughter reports that she is still recovering but will make those connections to resources discussed during home visit once she is better. Daughter reports that they are "holding off" on SCAT for now. However, patient has been approved for the [employer] program that patient has been excited about. Daughter shares that there are no further updates on patient. Daughter is requesting next outreach in two weeks for further updates on PACE and community resources.  Plan-CSW will complete next outreach within two weeks and continue to provide social work assistance as needed.  [name], BSW, MSW, [email or web address] Phone: [phone] Fax: [phone]

## 2016-02-02 NOTE — Telephone Encounter (Signed)
Last OV 01/15/16  Please advise. Thanks.

## 2016-02-02 NOTE — Telephone Encounter (Signed)
Refill x1 

## 2016-02-02 NOTE — Patient Outreach (Signed)
[facility]) Care Management  02/02/2016  KIYOMI MATTY [birth year] [id number]   Assessment-CSW completed outreach attempt to patient's daughter today. CSW unable to reach her successfully. CSW left a HIPPA compliant voice message encouraging daughter to return call once available.  Plan-CSW will await return call or complete an additional outreach if needed.  [name], BSW, MSW, [email or web address] Phone: [phone] Fax: [phone]

## 2016-02-03 MED ORDER — ALPRAZOLAM 0.5 MG PO TABS
0.5000 mg | ORAL_TABLET | Freq: Two times a day (BID) | ORAL | Status: DC | PRN
Start: 2016-02-03 — End: 2016-04-04

## 2016-02-03 NOTE — Addendum Note (Signed)
Addended by: Kathlen Brunswick on: 02/03/2016 09:14 AM   Modules accepted: Orders

## 2016-02-05 NOTE — Telephone Encounter (Signed)
Rx faxed to the patient's preferred pharmacy.

## 2016-02-09 DIAGNOSIS — I10 Essential (primary) hypertension: Secondary | ICD-10-CM

## 2016-02-10 DIAGNOSIS — J449 Chronic obstructive pulmonary disease, unspecified: Secondary | ICD-10-CM | POA: Diagnosis not present

## 2016-02-10 DIAGNOSIS — J9601 Acute respiratory failure with hypoxia: Secondary | ICD-10-CM | POA: Diagnosis not present

## 2016-02-12 ENCOUNTER — Other Ambulatory Visit: Payer: Self-pay | Admitting: Family Medicine

## 2016-02-18 ENCOUNTER — Other Ambulatory Visit: Payer: Self-pay | Admitting: Licensed Clinical Social Worker

## 2016-02-18 NOTE — Patient Outreach (Signed)
[facility]) Care Management  02/18/2016  MASHAYLA DILONE 1951/03/31 [id number]   Assessment-CSW completed outreach attempt today to patient's daughter. CSW unable to reach patient successfully. CSW left a HIPPA compliant voice message encouraging patient's daughter to return call once available and to inform her that this CSW will be out of the office all of next week.  Plan-CSW will await return call or complete an additional outreach if needed once returned to office.  [name], BSW, MSW, [email or web address] Phone: [phone] Fax: [phone]

## 2016-03-02 ENCOUNTER — Other Ambulatory Visit: Payer: Self-pay | Admitting: Licensed Clinical Social Worker

## 2016-03-02 NOTE — Patient Outreach (Signed)
[facility]) Care Management  03/02/2016  URMI MCFATTER [birth year] [id number]   Assessment-CSW completed second outreach attempt today. CSW unable to reach patient successfully. CSW left a HIPPA compliant voice message encouraging patient to return call once available.  Plan-CSW will await return call or complete an additional outreach if needed. Trouble maintaining contact with patient and family.  [name], BSW, MSW, [email or web address] Phone: [phone] Fax: [phone]

## 2016-03-04 ENCOUNTER — Other Ambulatory Visit: Payer: Self-pay | Admitting: Licensed Clinical Social Worker

## 2016-03-04 NOTE — Patient Outreach (Signed)
[facility]) Care Management  03/04/2016  Sue [name] March 10, 1951 [id number]   Assessment- CSW completed third and final outreach today. CSW unable to reach patient or daughter. CSW will send outreach barrier letter to family's residence and await 10 business days before completing discharge.  Plan-CSW will update [facility] RNCM.  Sue [name], BSW, MSW, [email or web address] Phone: [phone] Fax: [phone]

## 2016-03-11 DIAGNOSIS — J449 Chronic obstructive pulmonary disease, unspecified: Secondary | ICD-10-CM | POA: Diagnosis not present

## 2016-03-11 DIAGNOSIS — J9601 Acute respiratory failure with hypoxia: Secondary | ICD-10-CM | POA: Diagnosis not present

## 2016-03-16 ENCOUNTER — Other Ambulatory Visit: Payer: Self-pay | Admitting: Licensed Clinical Social Worker

## 2016-03-16 ENCOUNTER — Encounter: Payer: Self-pay | Admitting: Licensed Clinical Social Worker

## 2016-03-16 NOTE — Patient Outreach (Signed)
[facility]) Care Management  03/16/2016  EDLIN PAPSON [birth year] [id number]   Assessment- CSW has been able to maintain contact with patient and family. CSW has mailed outreach barrier letter to family but has not heard back from family. CSW will complete case closure at this time as no other [facility] discipline is involved.  Plan-CSW will complete case closure and notify [place] Management Assistant and PCP.  [name], BSW, MSW, [email or web address] Phone: [phone] Fax: [phone]

## 2016-03-24 ENCOUNTER — Other Ambulatory Visit: Payer: Self-pay | Admitting: Family Medicine

## 2016-03-29 DIAGNOSIS — F319 Bipolar disorder, unspecified: Secondary | ICD-10-CM | POA: Diagnosis not present

## 2016-04-04 ENCOUNTER — Other Ambulatory Visit: Payer: Self-pay | Admitting: Family Medicine

## 2016-04-04 NOTE — Telephone Encounter (Signed)
Last seen 01/15/16 and filled 02/03/16 #60 with 1 rf   Please advise    KP

## 2016-04-11 DIAGNOSIS — J9601 Acute respiratory failure with hypoxia: Secondary | ICD-10-CM | POA: Diagnosis not present

## 2016-04-11 DIAGNOSIS — J449 Chronic obstructive pulmonary disease, unspecified: Secondary | ICD-10-CM | POA: Diagnosis not present

## 2016-04-12 ENCOUNTER — Encounter: Payer: Self-pay | Admitting: Family Medicine

## 2016-04-12 ENCOUNTER — Ambulatory Visit (INDEPENDENT_AMBULATORY_CARE_PROVIDER_SITE_OTHER): Payer: Commercial Managed Care - HMO | Admitting: Family Medicine

## 2016-04-12 VITALS — BP 100/60 | HR 87 | Temp 98.1°F | Ht 63.0 in

## 2016-04-12 DIAGNOSIS — N644 Mastodynia: Secondary | ICD-10-CM

## 2016-04-12 DIAGNOSIS — M79675 Pain in left toe(s): Secondary | ICD-10-CM

## 2016-04-12 DIAGNOSIS — K120 Recurrent oral aphthae: Secondary | ICD-10-CM | POA: Diagnosis not present

## 2016-04-12 MED ORDER — NYSTATIN 100000 UNIT/ML MT SUSP: 5.0000 mL | Freq: Four times a day (QID) | OROMUCOSAL | Status: AC

## 2016-04-12 NOTE — Progress Notes (Signed)
Patient ID: Sue [name], female    DOB: Jun 29, 1951  Age: 65 y.o. MRN: [id number]    Subjective:  Subjective  HPI Sue [name] presents for for multiple complaints.   + b/l breast pain x 2 years,  Hx of calcifications in breast --  She was being seen at [place] [facility] for this.   She also c/o sores in her mouth on and off this past year She also c/o pain in her L pinky toe-- she hit it on LV coffee table about 2 weeks ago-- not getting better.    Review of Systems  Constitutional: Negative for appetite change, diaphoresis, fatigue and unexpected weight change.  HENT: Positive for mouth sores. Negative for congestion.   Eyes: Negative for pain, redness and visual disturbance.  Respiratory: Negative for cough, chest tightness, shortness of breath and wheezing.   Cardiovascular: Negative for chest pain, palpitations and leg swelling.  Endocrine: Negative for cold intolerance, heat intolerance, polydipsia, polyphagia and polyuria.  Genitourinary: Negative for difficulty urinating, dysuria and frequency.  Musculoskeletal:       Pain in L 5th toe  Neurological: Negative for dizziness, light-headedness, numbness and headaches.    History Past Medical History:  Diagnosis Date  . Depression   . Emphysema/COPD ([place])   . GERD (gastroesophageal reflux disease)   . Hypertension   . Stroke [facility]) 2016   TIA   . Urine incontinence     She has a past surgical history that includes Abdominal hysterectomy and Cesarean section.   Her family history includes Bipolar disorder in her father; Depression in her father; Diabetes in her sister; Heart disease in her brother, father, paternal aunt, paternal grandmother, and paternal uncle; Heart disease (age of onset: 12) in her sister; Hyperlipidemia in her sister; Hypertension in her brother, father, and sister; Schizophrenia in her paternal aunt.She reports that she quit smoking about 4 years ago. Her smoking use included Cigarettes. She has a  40.00 pack-year smoking history. She has never used smokeless tobacco. She reports that she drinks alcohol. She reports that she does not use drugs.  Current Outpatient Prescriptions on File Prior to Visit  Medication Sig Dispense Refill  . albuterol (PROVENTIL HFA;VENTOLIN HFA) 108 (90 BASE) MCG/ACT inhaler Inhale 2 puffs into the lungs every 6 (six) hours as needed for wheezing or shortness of breath. 1 Inhaler 0  . ALPRAZolam (XANAX) 0.5 MG tablet TAKE ONE TABLET BY MOUTH TWICE DAILY AS NEEDED FOR ANXIETY 60 tablet 0  . aspirin EC 325 MG EC tablet Take 1 tablet (325 mg total) by mouth daily. 60 tablet 0  . doxylamine, Sleep, (UNISOM) 25 MG tablet Take 100 mg by mouth at bedtime as needed for sleep.    . furosemide (LASIX) 20 MG tablet 3 po qd (Patient taking differently: Take 60 mg by mouth daily. 3 po qd) 270 tablet 1  . levothyroxine (SYNTHROID, LEVOTHROID) 50 MCG tablet TAKE ONE TABLET BY MOUTH DAILY 30 tablet 11  . metoprolol succinate (TOPROL-XL) 50 MG 24 hr tablet TAKE TWO TABLETS BY MOUTH IN THE MORNING TAKE  WITH  OR  IMMEDIATELY  FOLLOWING  A  MEAL 180 tablet 1  . nystatin (MYCOSTATIN) 100000 UNIT/ML suspension 5 ml swish and spit qid 60 mL 0  . OXYGEN Inhale 3 L into the lungs continuous.     . Potassium Chloride ER 20 MEQ TBCR Take 1 tablet by mouth 2 (two) times daily. 60 tablet 0  . QUEtiapine (SEROQUEL) 50 MG tablet  Take 2 tablets by mouth at bedtime.  3  . Vitamin D, Ergocalciferol, (DRISDOL) 50000 units CAPS capsule Take 1 capsule (50,000 Units total) by mouth every 7 (seven) days. 4 capsule 2  . zolpidem (AMBIEN) 5 MG tablet Take 1 tablet (5 mg total) by mouth at bedtime. 30 tablet 0   No current facility-administered medications on file prior to visit.      Objective:  Objective  Physical Exam  Constitutional: She is oriented to person, place, and time. She appears well-developed and well-nourished.  HENT:  Head: Normocephalic and atraumatic.  Mouth/Throat: Oral  lesions present.    Eyes: Conjunctivae and EOM are normal.  Neck: Normal range of motion. Neck supple. No JVD present. Carotid bruit is not present. No thyromegaly present.  Cardiovascular: Normal rate, regular rhythm and normal heart sounds.   No murmur heard. Pulmonary/Chest: Effort normal and breath sounds normal. No respiratory distress. She has no wheezes. She has no rales. She exhibits no tenderness.  Genitourinary: No breast swelling, tenderness, discharge or bleeding.  Musculoskeletal: She exhibits no edema.       Feet:  Neurological: She is alert and oriented to person, place, and time.  Psychiatric: She has a normal mood and affect.  Nursing note and vitals reviewed.  BP 100/60 (BP Location: Left Arm, Patient Position: Sitting, Cuff Size: Normal)   Pulse 87   Temp 98.1 F (36.7 C) (Oral)   Ht 5\' 3"  (1.6 m)   SpO2 92%  Wt Readings from Last 3 Encounters:  01/15/16 299 lb 12.8 oz (136 kg)  12/18/15 (!) 301 lb 3.2 oz (136.6 kg)  10/27/15 (!) 302 lb 12.8 oz (137.3 kg)     Lab Results  Component Value Date   WBC 4.9 01/15/2016   HGB 12.6 01/15/2016   HCT 36.9 01/15/2016   PLT 181.0 01/15/2016   GLUCOSE 145 (H) 01/15/2016   CHOL 98 01/15/2016   TRIG 66.0 01/15/2016   HDL 22.60 (L) 01/15/2016   LDLCALC 62 01/15/2016   ALT 20 01/15/2016   AST 49 (H) 01/15/2016   NA 137 01/15/2016   K 3.3 (L) 01/15/2016   CL 99 01/15/2016   CREATININE 1.22 (H) 01/15/2016   BUN 13 01/15/2016   CO2 33 (H) 01/15/2016   TSH 4.41 01/15/2016   INR 1.21 12/15/2015   HGBA1C 5.8 10/27/2015    Ct Head Wo Contrast  Result Date: 12/15/2015 CLINICAL DATA:  Slurred speech for 3 weeks.  Progressive memory loss EXAM: CT HEAD WITHOUT CONTRAST TECHNIQUE: Contiguous axial images were obtained from the base of the skull through the vertex without intravenous contrast. COMPARISON:  Head CT July 29, 2014; brain MRI July 30, 2014 FINDINGS: The ventricles are normal in size and configuration.  There is a minimal cavum septum pellucidum, an anatomic variant. There is no intracranial mass, hemorrhage, extra-axial fluid collection, or midline shift. There is minimal small vessel disease in the centra semiovale adjacent to the frontal horns of the lateral ventricles, stable. Elsewhere gray-white compartments appear normal. No acute infarct evident. The bony calvarium appears intact. The mastoid air cells are clear. There is mucosal thickening in the right maxillary antrum. 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 No intraorbital lesions evident. There is leftward deviation of the nasal septum. IMPRESSION: Minimal periventricular small vessel disease. No intracranial mass, hemorrhage, or acute appearing infarct. There is right maxillary sinus disease. 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 Orbital fat  extends into this defect, but there is no appreciable deviation of the extraocular muscles. Electronically Signed   By: Lowella Grip III M.D.   On: 12/15/2015 16:22     Assessment & Plan:  Plan  I am having Ms. Minion maintain her albuterol, OXYGEN, aspirin, nystatin, metoprolol succinate, furosemide, zolpidem, QUEtiapine, doxylamine (Sleep), Vitamin D (Ergocalciferol), Potassium Chloride ER, levothyroxine, and ALPRAZolam. We will continue to administer nystatin.  Meds ordered this encounter  Medications  . nystatin (MYCOSTATIN) 100000 UNIT/ML suspension 500,000 Units    Problem List Items Addressed This Visit    None    Visit Diagnoses    Toe pain, left    -  Primary   Relevant Orders   DG Foot Complete Left   Aphthous ulcer of tongue       Relevant Medications   nystatin (MYCOSTATIN) 100000 UNIT/ML suspension 500,000 Units   Breast pain       Relevant Orders   MM DIAG BREAST TOMO BILATERAL      Follow-up: Return in about 3 months (around 07/13/2016) for hypertension, fasting.  Ann Held, DO

## 2016-04-12 NOTE — Patient Instructions (Signed)
Canker Sores Canker sores are small, painful sores that develop inside your mouth. They may also be called aphthous ulcers. You can get canker sores on the inside of your lips or cheeks, on your tongue, or anywhere inside your mouth. You can have just one canker sore or several of them. Canker sores cannot be passed from one person to another (noncontagious). These sores are different than the sores that you may get on the outside of your lips (cold sores or fever blisters). Canker sores usually start as painful red bumps. Then they turn into small white, yellow, or gray ulcers that have red borders. The ulcers may be quite painful. The pain may be worse when you eat or drink. CAUSES The cause of this condition is not known. RISK FACTORS This condition is more likely to develop in:  Women.  People in their teens or 67s.  Women who are having their menstrual period.  People who are under a lot of emotional stress.  People who do not get enough iron or B vitamins.  People who have poor oral hygiene.  People who have an injury inside the mouth. This can happen after having dental work or from chewing something hard. SYMPTOMS Along with the canker sore, symptoms may also include:  Fever.  Fatigue.  Swollen lymph nodes in your neck. DIAGNOSIS This condition can be diagnosed based on your symptoms. Your health care provider will also examine your mouth. Your health care provider may also do tests if you get canker sores often or if they are very bad. Tests may include:  Blood tests to rule out other causes of canker sores.  Taking swabs from the sore to check for infection.  Taking a small piece of skin from the sore (biopsy) to test it for cancer. TREATMENT Most canker sores clear up without treatment in about 10 days. Home care is usually the only treatment that you will need. Over-the-counter medicines can relieve discomfort.If you have severe canker sores, your health care  provider may prescribe:  Numbing ointment to relieve pain.  Vitamins.  Steroid medicines. These may be given as:  Oral pills.  Mouth rinses.  Gels.  Antibiotic mouth rinse. HOME CARE INSTRUCTIONS  Apply, take, or use medicines only as directed by your health care provider. These include vitamins.  If you were prescribed an antibiotic mouth rinse, finish all of it even if you start to feel better.  Until the sores are healed:  Do not drink coffee or citrus juices.  Do not eat spicy or salty foods.  Use a mild, over-the-counter mouth rinse as directed by your health care provider.  Practice good oral hygiene.  Floss your teeth every day.  Brush your teeth with a soft brush twice each day. SEEK MEDICAL CARE IF:  Your symptoms do not get better after two weeks.  You also have a fever or swollen glands.  You get canker sores often.  You have a canker sore that is getting larger.  You cannot eat or drink due to your canker sores.   This information is not intended to replace advice given to you by your health care provider. Make sure you discuss any questions you have with your health care provider.   Document Released: 12/24/2010 Document Revised: 01/13/2015 Document Reviewed: 07/30/2014 Elsevier Interactive Patient Education [employer].

## 2016-05-02 ENCOUNTER — Other Ambulatory Visit: Payer: Self-pay | Admitting: Family Medicine

## 2016-05-02 NOTE — Telephone Encounter (Signed)
Last seen 04/12/16 and filled 04/04/16 #60   Please advise    KP

## 2016-05-12 DIAGNOSIS — J9601 Acute respiratory failure with hypoxia: Secondary | ICD-10-CM | POA: Diagnosis not present

## 2016-05-12 DIAGNOSIS — J449 Chronic obstructive pulmonary disease, unspecified: Secondary | ICD-10-CM | POA: Diagnosis not present

## 2016-05-14 ENCOUNTER — Other Ambulatory Visit: Payer: Self-pay | Admitting: Family Medicine

## 2016-06-02 ENCOUNTER — Other Ambulatory Visit: Payer: Self-pay | Admitting: Family Medicine

## 2016-06-08 ENCOUNTER — Other Ambulatory Visit: Payer: Self-pay | Admitting: Family Medicine

## 2016-06-08 DIAGNOSIS — N63 Unspecified lump in unspecified breast: Secondary | ICD-10-CM

## 2016-06-11 DIAGNOSIS — J449 Chronic obstructive pulmonary disease, unspecified: Secondary | ICD-10-CM | POA: Diagnosis not present

## 2016-06-11 DIAGNOSIS — J9601 Acute respiratory failure with hypoxia: Secondary | ICD-10-CM | POA: Diagnosis not present

## 2016-06-14 ENCOUNTER — Other Ambulatory Visit: Payer: Self-pay | Admitting: Family Medicine

## 2016-06-14 DIAGNOSIS — G4700 Insomnia, unspecified: Secondary | ICD-10-CM

## 2016-06-14 NOTE — Telephone Encounter (Signed)
Last seen 04/12/16 and filled 10/30/15 #30   Please advise    KP

## 2016-06-15 ENCOUNTER — Telehealth: Payer: Self-pay

## 2016-06-15 NOTE — Telephone Encounter (Signed)
Medication for Ambien has been faxed [phone] [facility]

## 2016-06-22 ENCOUNTER — Other Ambulatory Visit: Payer: Self-pay | Admitting: Family Medicine

## 2016-06-30 ENCOUNTER — Ambulatory Visit
Admission: RE | Admit: 2016-06-30 | Discharge: 2016-06-30 | Disposition: A | Discharge status: Home / Self Care | Payer: Commercial Managed Care - HMO | Source: Ambulatory Visit | Attending: Family Medicine | Admitting: Family Medicine

## 2016-06-30 DIAGNOSIS — N644 Mastodynia: Secondary | ICD-10-CM

## 2016-06-30 DIAGNOSIS — N63 Unspecified lump in unspecified breast: Secondary | ICD-10-CM

## 2016-07-04 ENCOUNTER — Other Ambulatory Visit: Payer: Self-pay | Admitting: Family Medicine

## 2016-07-05 NOTE — Telephone Encounter (Signed)
Sue [name]-- pt is due for follow up with Dr Carollee Herter on 07/13/16.  Please call her and schedule appt. Thanks!  Last Rx: 05/02/16, #60 x 1 refill. No UDS or CSC on file. Pt is due for follow up on 07/13/16. Will need to collect at that visit. Appointment also needs to be made.  Rx printed and forwarded to PCP for signature.

## 2016-07-05 NOTE — Telephone Encounter (Signed)
Relation to CL:EXNT Call back number:[phone] Pharmacy: [place], [place] - [address] [phone] (Phone) [phone] (Fax)     Reason for call:  Patient checking on the status of of medication request, please advise.

## 2016-07-06 NOTE — Telephone Encounter (Signed)
Patient scheduled for 07/12/2016 with Dr. Etter Sjogren for toe pain and will schedule follow up at time of acute appointment if still necessary.

## 2016-07-12 ENCOUNTER — Ambulatory Visit: Payer: Commercial Managed Care - HMO | Admitting: Family Medicine

## 2016-07-12 ENCOUNTER — Telehealth: Payer: Self-pay | Admitting: Family Medicine

## 2016-07-12 DIAGNOSIS — J449 Chronic obstructive pulmonary disease, unspecified: Secondary | ICD-10-CM | POA: Diagnosis not present

## 2016-07-12 DIAGNOSIS — J9601 Acute respiratory failure with hypoxia: Secondary | ICD-10-CM | POA: Diagnosis not present

## 2016-07-12 NOTE — Telephone Encounter (Signed)
Patient cancelled 2:15pm appointment today due to no transportation, charge or no charge

## 2016-07-12 NOTE — Telephone Encounter (Signed)
No charge and block

## 2016-07-18 ENCOUNTER — Ambulatory Visit: Payer: Commercial Managed Care - HMO | Admitting: Family Medicine

## 2016-07-19 ENCOUNTER — Ambulatory Visit (INDEPENDENT_AMBULATORY_CARE_PROVIDER_SITE_OTHER): Payer: Commercial Managed Care - HMO | Admitting: Physician Assistant

## 2016-07-19 ENCOUNTER — Ambulatory Visit (HOSPITAL_BASED_OUTPATIENT_CLINIC_OR_DEPARTMENT_OTHER)
Admission: RE | Admit: 2016-07-19 | Discharge: 2016-07-19 | Disposition: A | Discharge status: Home / Self Care | Payer: Commercial Managed Care - HMO | Source: Ambulatory Visit | Attending: Family Medicine | Admitting: Family Medicine

## 2016-07-19 ENCOUNTER — Encounter: Payer: Self-pay | Admitting: Physician Assistant

## 2016-07-19 VITALS — BP 102/68 | HR 78 | Temp 98.0°F | Resp 20 | Ht 63.0 in | Wt 292.2 lb

## 2016-07-19 DIAGNOSIS — Z1159 Encounter for screening for other viral diseases: Secondary | ICD-10-CM

## 2016-07-19 DIAGNOSIS — R062 Wheezing: Secondary | ICD-10-CM | POA: Diagnosis not present

## 2016-07-19 DIAGNOSIS — B9689 Other specified bacterial agents as the cause of diseases classified elsewhere: Secondary | ICD-10-CM | POA: Diagnosis not present

## 2016-07-19 DIAGNOSIS — E038 Other specified hypothyroidism: Secondary | ICD-10-CM

## 2016-07-19 DIAGNOSIS — J208 Acute bronchitis due to other specified organisms: Secondary | ICD-10-CM

## 2016-07-19 DIAGNOSIS — R937 Abnormal findings on diagnostic imaging of other parts of musculoskeletal system: Secondary | ICD-10-CM | POA: Diagnosis not present

## 2016-07-19 DIAGNOSIS — M79675 Pain in left toe(s): Secondary | ICD-10-CM | POA: Diagnosis not present

## 2016-07-19 DIAGNOSIS — M79672 Pain in left foot: Secondary | ICD-10-CM | POA: Diagnosis not present

## 2016-07-19 DIAGNOSIS — R739 Hyperglycemia, unspecified: Secondary | ICD-10-CM

## 2016-07-19 DIAGNOSIS — R945 Abnormal results of liver function studies: Secondary | ICD-10-CM | POA: Diagnosis not present

## 2016-07-19 DIAGNOSIS — S99922A Unspecified injury of left foot, initial encounter: Secondary | ICD-10-CM | POA: Diagnosis not present

## 2016-07-19 MED ORDER — ALBUTEROL SULFATE HFA 108 (90 BASE) MCG/ACT IN AERS: 2.0000 | INHALATION_SPRAY | Freq: Four times a day (QID) | RESPIRATORY_TRACT | 0 refills | Status: DC | PRN

## 2016-07-19 MED ORDER — BENZONATATE 100 MG PO CAPS: 100.0000 mg | ORAL_CAPSULE | Freq: Three times a day (TID) | ORAL | 0 refills | Status: DC | PRN

## 2016-07-19 MED ORDER — DOXYCYCLINE HYCLATE 100 MG PO CAPS: 100.0000 mg | ORAL_CAPSULE | Freq: Two times a day (BID) | ORAL | 0 refills | Status: DC

## 2016-07-19 NOTE — Patient Instructions (Addendum)
Take antibiotic (Doxycycline) as directed.  Increase fluids.  Get plenty of rest. Use Mucinex for congestion. Tessalon as directed for cough. Take a daily probiotic (I recommend Align or Culturelle, but even Activia Yogurt may be beneficial).  A humidifier placed in the bedroom may offer some relief for a dry, scratchy throat of nasal irritation.  Read information below on acute bronchitis. Follow-up if symptoms are not improving by Friday.  Acute Bronchitis Bronchitis is when the airways that extend from the windpipe into the lungs get red, puffy, and painful (inflamed). Bronchitis often causes thick spit (mucus) to develop. This leads to a cough. A cough is the most common symptom of bronchitis. In acute bronchitis, the condition usually begins suddenly and goes away over time (usually in 2 weeks). Smoking, allergies, and asthma can make bronchitis worse. Repeated episodes of bronchitis may cause more lung problems.  HOME CARE  Rest.  Drink enough fluids to keep your pee (urine) clear or pale yellow (unless you need to limit fluids as told by your doctor).  Only take over-the-counter or prescription medicines as told by your doctor.  Avoid smoking and secondhand smoke. These can make bronchitis worse. If you are a smoker, think about using nicotine gum or skin patches. Quitting smoking will help your lungs heal faster.  Reduce the chance of getting bronchitis again by:  Washing your hands often.  Avoiding people with cold symptoms.  Trying not to touch your hands to your mouth, nose, or eyes.  Follow up with your doctor as told.  GET HELP IF: Your symptoms do not improve after 1 week of treatment. Symptoms include:  Cough.  Fever.  Coughing up thick spit.  Body aches.  Chest congestion.  Chills.  Shortness of breath.  Sore throat.  GET HELP RIGHT AWAY IF:   You have an increased fever.  You have chills.  You have severe shortness of breath.  You have bloody  thick spit (sputum).  You throw up (vomit) often.  You lose too much body fluid (dehydration).  You have a severe headache.  You faint.  MAKE SURE YOU:   Understand these instructions.  Will watch your condition.  Will get help right away if you are not doing well or get worse. Document Released: 02/15/2008 Document Revised: 05/01/2013 Document Reviewed: 02/19/2013 [facility] Patient Information 2015 [place], [place]. This information is not intended to replace advice given to you by your health care provider. Make sure you discuss any questions you have with your health care provider.

## 2016-07-19 NOTE — Progress Notes (Signed)
Pre visit review using our clinic review tool, if applicable. No additional management support is needed unless otherwise documented below in the visit note/SLS  

## 2016-07-19 NOTE — Progress Notes (Signed)
Patient presents to clinic today c/o 2 weeks of chest congestion with cough productive of green sputum. Notes mild nasal congestion without sinus pressure/sinus pain. Denies chest pain but notes wheezing and chest tightness. Denies fever, chills. Noted mild fatigue. Has rescue inhaler but has been out of medication.  Denies recent travel or sick contact.  Past Medical History:  Diagnosis Date  . Depression   . Emphysema/COPD ([place])   . GERD (gastroesophageal reflux disease)   . Hypertension   . Stroke [facility]) 2016   TIA   . Urine incontinence     Current Outpatient Prescriptions on File Prior to Visit  Medication Sig Dispense Refill  . albuterol (PROVENTIL HFA;VENTOLIN HFA) 108 (90 BASE) MCG/ACT inhaler Inhale 2 puffs into the lungs every 6 (six) hours as needed for wheezing or shortness of breath. 1 Inhaler 0  . ALPRAZolam (XANAX) 0.5 MG tablet TAKE ONE TABLET BY MOUTH TWICE DAILY AS NEEDED FOR ANXIETY 60 tablet 1  . aspirin EC 325 MG EC tablet Take 1 tablet (325 mg total) by mouth daily. 60 tablet 0  . doxylamine, Sleep, (UNISOM) 25 MG tablet Take 100 mg by mouth at bedtime as needed for sleep.    . furosemide (LASIX) 20 MG tablet 3 po qd (Patient taking differently: Take 60 mg by mouth daily. 3 po qd) 270 tablet 1  . levothyroxine (SYNTHROID, LEVOTHROID) 50 MCG tablet TAKE ONE TABLET BY MOUTH DAILY 30 tablet 11  . losartan (COZAAR) 100 MG tablet TAKE ONE TABLET BY MOUTH ONCE DAILY 30 tablet 5  . metoprolol succinate (TOPROL-XL) 50 MG 24 hr tablet TAKE TWO TABLETS BY MOUTH IN THE MORNING TAKE  WITH  OR IMMEDIATELY FOLLOWING A MEAL 60 tablet 5  . nystatin (MYCOSTATIN) 100000 UNIT/ML suspension 5 ml swish and spit qid 60 mL 0  . OXYGEN Inhale 3 L into the lungs continuous.     . Potassium Chloride ER 20 MEQ TBCR Take 1 tablet by mouth 2 (two) times daily. Repeat labs are due now 60 tablet 0  . QUEtiapine (SEROQUEL) 50 MG tablet Take 2 tablets by mouth at bedtime.  3  . Vitamin D,  Ergocalciferol, (DRISDOL) 50000 units CAPS capsule Take 1 capsule (50,000 Units total) by mouth every 7 (seven) days. 4 capsule 2  . zolpidem (AMBIEN) 5 MG tablet TAKE ONE TABLET BY MOUTH AT BEDTIME 30 tablet 0   No current facility-administered medications on file prior to visit.     No Known Allergies  Family History  Problem Relation Age of Onset  . Heart disease Father     MVP and Pics Valve  . Hypertension Father   . Depression Father     Institutionalized x's 2 years  . Bipolar disorder Father   . Heart disease Paternal Grandmother   . Heart disease Paternal Aunt   . Heart disease Paternal Uncle   . Schizophrenia Paternal Aunt   . Hypertension Sister   . Diabetes Sister   . Hyperlipidemia Sister   . Heart disease Sister 27    MI  . Heart disease Brother   . Hypertension Brother     Social History   Social History  . Marital status: Single    Spouse name: N/A  . Number of children: N/A  . Years of education: N/A   Occupational History  . disabled- Occupational hygienist Med    Social History Main Topics  . Smoking status: Former Smoker    Packs/day: 1.00    Years:  40.00    Types: Cigarettes    Quit date: 10/16/2011  . Smokeless tobacco: Never Used  . Alcohol use Yes     Comment: Occ-- Wine  . Drug use: No  . Sexual activity: Not Asked   Other Topics Concern  . None   Social History Narrative  . None   Review of Systems - See HPI.  All other ROS are negative.  BP 102/68 (BP Location: Right Arm, Patient Position: Sitting, Cuff Size: Large)   Pulse 78   Temp 98 F (36.7 C) (Oral)   Resp 20   Ht 5\' 3"  (1.6 m)   Wt 292 lb 4 oz (132.6 kg)   SpO2 94% Comment: 3ltr/pm cannula  BMI 51.77 kg/m   Physical Exam  Constitutional: She is oriented to person, place, and time and well-developed, well-nourished, and in no distress.  HENT:  Head: Normocephalic and atraumatic.  Right Ear: External ear normal.  Left Ear: External ear normal.  Nose: Nose normal.    Mouth/Throat: No oropharyngeal exudate.  TM within normal limits bilaterally.  Eyes: Conjunctivae are normal.  Neck: Neck supple.  Cardiovascular: Normal rate, regular rhythm, normal heart sounds and intact distal pulses.   Pulmonary/Chest: Effort normal and breath sounds normal. No respiratory distress. She has no wheezes. She has no rales. She exhibits no tenderness.  Neurological: She is alert and oriented to person, place, and time.  Skin: Skin is warm and dry. No rash noted.  Psychiatric: Affect normal.  Vitals reviewed.  Assessment/Plan:  Acute bacterial bronchitis Will start ABX. Rx Doxycycline. Supportive measures reviewed. Tessalon for cough. Albuterol refilled. Use as directed. Return precautions discussed with patient.  - albuterol (PROVENTIL HFA;VENTOLIN HFA) 108 (90 Base) MCG/ACT inhaler; Inhale 2 puffs into the lungs every 6 (six) hours as needed for wheezing or shortness of breath.  Dispense: 1 Inhaler; Refill: 0 - doxycycline (VIBRAMYCIN) 100 MG capsule; Take 1 capsule (100 mg total) by mouth 2 (two) times daily.  Dispense: 14 capsule; Refill: 0   Leeanne [place], [place]

## 2016-07-20 ENCOUNTER — Telehealth: Payer: Self-pay | Admitting: Family Medicine

## 2016-07-20 DIAGNOSIS — B9689 Other specified bacterial agents as the cause of diseases classified elsewhere: Secondary | ICD-10-CM

## 2016-07-20 DIAGNOSIS — J208 Acute bronchitis due to other specified organisms: Principal | ICD-10-CM

## 2016-07-20 LAB — BASIC METABOLIC PANEL
BUN: 20 mg/dL (ref 6–23)
CHLORIDE: 99 meq/L (ref 96–112)
CO2: 31 meq/L (ref 19–32)
CREATININE: 1.98 mg/dL — AB (ref 0.40–1.20)
Calcium: 8.7 mg/dL (ref 8.4–10.5)
GFR: 32.64 mL/min — ABNORMAL LOW (ref >60.00)
Glucose, Bld: 86 mg/dL (ref 70–99)
Potassium: 3.7 mEq/L (ref 3.5–5.1)
SODIUM: 135 meq/L (ref 135–145)

## 2016-07-20 LAB — HEPATITIS C ANTIBODY: HCV AB: REACTIVE — AB

## 2016-07-20 LAB — HEMOGLOBIN A1C: Hgb A1c MFr Bld: 5.8 % (ref 4.6–6.5)

## 2016-07-20 LAB — TSH: TSH: 3.77 u[IU]/mL (ref 0.35–4.50)

## 2016-07-20 MED ORDER — ALBUTEROL SULFATE HFA 108 (90 BASE) MCG/ACT IN AERS: 2.0000 | INHALATION_SPRAY | Freq: Four times a day (QID) | RESPIRATORY_TRACT | 0 refills | Status: DC | PRN

## 2016-07-20 MED ORDER — DOXYCYCLINE HYCLATE 100 MG PO CAPS: 100.0000 mg | ORAL_CAPSULE | Freq: Two times a day (BID) | ORAL | 0 refills | Status: DC

## 2016-07-20 MED ORDER — BENZONATATE 100 MG PO CAPS: 100.0000 mg | ORAL_CAPSULE | Freq: Three times a day (TID) | ORAL | 0 refills | Status: DC | PRN

## 2016-07-20 NOTE — Telephone Encounter (Signed)
Medications have all been sent.  Our apologies. We were having some computer glitches yesterday with electronic prescribing. Should be resolved now and Rx should be at the requested pharmacy.  Please call patient and let her know.

## 2016-07-20 NOTE — Telephone Encounter (Signed)
Patient informed. 

## 2016-07-20 NOTE — Telephone Encounter (Signed)
Relation to DK:CCQF Call back [phone]   Reason for call:  Patient last seen by PA 07/19/2016 requesting benzonatate (TESSALON) 100 MG capsule, doxycycline (VIBRAMYCIN) 100 MG capsule and albuterol (PROVENTIL HFA;VENTOLIN HFA) 108 (90 Base) MCG/ACT inhaler  please send to   [place], [place] - [address] [place] [phone] (Phone) [phone] (Fax)

## 2016-07-21 LAB — HEPATITIS C RNA QUANTITATIVE
HCV QUANT: 4841768 [IU]/mL — AB (ref <15)
HCV Quantitative Log: 6.69 {Log} — ABNORMAL HIGH (ref <1.18)

## 2016-07-22 ENCOUNTER — Telehealth: Payer: Self-pay | Admitting: Family Medicine

## 2016-07-22 MED ORDER — AZITHROMYCIN 250 MG PO TABS: ORAL_TABLET | ORAL | 0 refills | Status: DC

## 2016-07-22 MED ORDER — PROMETHAZINE-DM 6.25-15 MG/5ML PO SYRP
5.0000 mL | ORAL_SOLUTION | Freq: Four times a day (QID) | ORAL | 0 refills | Status: DC | PRN
Start: 2016-07-22 — End: 2017-01-31

## 2016-07-22 NOTE — Telephone Encounter (Signed)
Medication filled to pharmacy as requested.   Verified w/ Dr. Carollee Herter phenergan-dextromethorphan NOT phenergan-guaf.

## 2016-07-22 NOTE — Telephone Encounter (Signed)
Cody, please advise. Per chart review, pt is on day 2 of a 7 day course of doxycycline and was also prescribed Tessalon.

## 2016-07-22 NOTE — Telephone Encounter (Signed)
Patient was seen on 07/19/16 by Elyn Aquas. Patient called stating that she is not feeling any better, the medications are not helping. She would like to know if she could get a prescription for a z-pac and oxycodone to get this cleared up instead. Please advise.   Patient phone: [phone]

## 2016-07-22 NOTE — Telephone Encounter (Signed)
I do not prescribe oxycodone for her symptoms. She can discuss with her PCP. She has not really been on antibiotic long enough to wear we would expect major improvement. Would recommend she continue. If she is adamant then we can stop and Rx Azithromycin pending no drug interactions.

## 2016-07-22 NOTE — Telephone Encounter (Signed)
Relation to GQ:QPYP Call back number:[phone] Pharmacy: [facility] [place], [place] - [address] AT [address] [phone] (Phone) [phone] (Fax)     Reason for call:  symptoms are not improving, patient calling back checking on the status of message below. Best # [phone]

## 2016-07-22 NOTE — Telephone Encounter (Signed)
Pt called back in to follow up on message sent. I advised of response back from provider. Pt says that she would like to be advised per her PCP. Pt says that she would like a call back directly

## 2016-07-22 NOTE — Telephone Encounter (Signed)
D/c doxy z pak-- #1  As directed Phenergan--guaf   1-2 tsp po qid prn

## 2016-07-23 ENCOUNTER — Other Ambulatory Visit: Payer: Self-pay | Admitting: Family Medicine

## 2016-07-23 DIAGNOSIS — B1920 Unspecified viral hepatitis C without hepatic coma: Secondary | ICD-10-CM

## 2016-08-01 ENCOUNTER — Ambulatory Visit (INDEPENDENT_AMBULATORY_CARE_PROVIDER_SITE_OTHER): Payer: Commercial Managed Care - HMO | Admitting: Internal Medicine

## 2016-08-01 ENCOUNTER — Encounter: Payer: Self-pay | Admitting: Internal Medicine

## 2016-08-01 ENCOUNTER — Other Ambulatory Visit: Payer: Self-pay | Admitting: Family Medicine

## 2016-08-01 DIAGNOSIS — B182 Chronic viral hepatitis C: Secondary | ICD-10-CM | POA: Insufficient documentation

## 2016-08-01 LAB — BASIC METABOLIC PANEL
BUN: 11 mg/dL (ref 7–25)
CALCIUM: 9.5 mg/dL (ref 8.6–10.4)
CO2: 32 mmol/L — AB (ref 20–31)
CREATININE: 1.08 mg/dL — AB (ref 0.50–0.99)
Chloride: 101 mmol/L (ref 98–110)
GLUCOSE: 81 mg/dL (ref 65–99)
Potassium: 3.7 mmol/L (ref 3.5–5.3)
SODIUM: 138 mmol/L (ref 135–146)

## 2016-08-01 LAB — HEPATITIS B SURFACE ANTIGEN: HEP B S AG: NEGATIVE

## 2016-08-01 LAB — HEPATITIS B CORE ANTIBODY, TOTAL: Hep B Core Total Ab: NONREACTIVE

## 2016-08-01 LAB — HEPATITIS A ANTIBODY, TOTAL: HEP A TOTAL AB: REACTIVE — AB

## 2016-08-01 LAB — HIV ANTIBODY (ROUTINE TESTING W REFLEX): HIV 1&2 Ab, 4th Generation: NONREACTIVE

## 2016-08-01 LAB — HEPATITIS B SURFACE ANTIBODY,QUALITATIVE

## 2016-08-01 NOTE — Patient Instructions (Signed)
Date 08/01/16  Dear Ms Sue [name], As discussed in the [facility], your hepatitis C therapy will include the following medications:          Zepatier (elbasvir 50 mg/grazoprevir 100 mg) for 12 weeks              OR      16 weeks with ribavirin in certain cases   Please note that ALL MEDICATIONS WILL START ON THE SAME DATE for a total of 12 weeks. ---------------------------------------------------------------- Your HCV Treatment Start Date: TBA   Your HCV genotype:  unknown    Liver Fibrosis: TBD    ---------------------------------------------------------------- YOUR PHARMACY CONTACT:   [facility] [place] Phone: [phone] Hours: Monday to Friday 7:30 am to 6:00 pm   Please always contact your pharmacy at least 3-4 business days before you run out of medications to ensure your next month's medication is ready or 1 week prior to running out if you receive it by mail.  Remember, each prescription is for 28 days. ---------------------------------------------------------------- GENERAL NOTES REGARDING YOUR HEPATITIS C MEDICATION:  ZEPATIER is available as a beige-colored, oval-shaped, film-coated tablet debossed with "770" on one side and plain on the other. Each tablet contains 50 mg elbasvir and 100 mg grazoprevir.   Common side effects of ZEPATIER when used without ribavirin include: - feeling tired -trouble sleeping - headache -diarrhea - nausea  Common side effects of ZEPATIER when used with ribavirin include: - low red blood cell counts (anemia) - feeling irritable - headache - stomach pain - feeling tired - depression - shortness of breath - joint pain - rash or itching   Please note that this only lists the most common side effects and is NOT a comprehensive list of the potential side effects of these medications. For more information, please review the drug information sheets that come with your  medication package from the pharmacy.  ---------------------------------------------------------------- GENERAL HELPFUL HINTS ON HCV THERAPY: 1. Stay well-hydrated. 2. Notify the [facility] of any changes in your other over-the-counter/herbal or prescription medications. 3. If you miss a dose of your medication, take the missed dose as soon as you remember. Return to your regular time/dose schedule the next day.  4.  Do not stop taking your medications without first talking with your healthcare provider. 5.  You may take Tylenol (acetaminophen), as long as the dose is less than 2000 mg (OR no more than 4 tablets of the Tylenol Extra Strengths 500mg  tablet) in 24 hours. 6.  You will see our pharmacist-specialist within the first 2 weeks of starting your medication. 7.  You will be scheduled for labs once during treatment, soon after treatment completion and 6 months or more after treatment completion to verify the virus is out of your system.   8.  If ribavirin is part of your regimen, you may have a lab visit every 2 weeks.   Scharlene Gloss, [place] for [place], [place]  [phone]

## 2016-08-01 NOTE — Progress Notes (Signed)
[facility] for [facility]ious [facility]   CC: consideration for treatment for chronic hepatitis C  HPI:  +Sue [name] is a 65 y.o. female who presents for initial evaluation and management of chronic hepatitis C.  Patient tested positive earlier this year. Hepatitis C-associated risk factors present are: none. Patient denies history of blood transfusion, history of clotting factor transfusion, intranasal drug use, IV drug abuse, multiple sexual partners, renal dialysis, sexual contact with person with liver disease. Patient has had other studies performed. Results: hepatitis C RNA by PCR, result: positive. Patient has not had prior treatment for Hepatitis C. Patient does not have a past history of liver disease. Patient does not have a family history of liver disease. Patient does not  have associated signs or symptoms related to liver disease.  Labs reviewed and confirm chronic hepatitis C with a positive viral load.   Records reviewed from PCP.  Routine testing.       Patient does not have documented immunity to Hepatitis A. Patient does not have documented immunity to Hepatitis B.    Review of Systems:  Constitutional: negative for fatigue, malaise and anorexia Gastrointestinal: negative for diarrhea Musculoskeletal: negative for myalgias and arthralgias All other systems reviewed and are negative       Past Medical History:  Diagnosis Date  . Depression   . Emphysema/COPD ([place])   . GERD (gastroesophageal reflux disease)   . Hypertension   . Stroke [facility]) 2016   TIA   . Urine incontinence     Prior to Admission medications   Medication Sig Start Date End Date Taking? Authorizing Provider  albuterol (PROVENTIL HFA;VENTOLIN HFA) 108 (90 Base) MCG/ACT inhaler Inhale 2 puffs into the lungs every 6 (six) hours as needed for wheezing or shortness of breath. 07/20/16  Yes Brunetta Jeans, PA-C  ALPRAZolam Duanne Moron) 0.5 MG tablet TAKE ONE TABLET BY MOUTH TWICE DAILY AS NEEDED FOR  ANXIETY 07/05/16  Yes Yvonne R Lowne Chase, DO  aspirin EC 325 MG EC tablet Take 1 tablet (325 mg total) by mouth daily. 07/30/14  Yes Shanker Kristeen Mans, MD  azithromycin (ZITHROMAX) 250 MG tablet Take 2 tablets by mouth on Day 1. Then take 1 tablet daily. 07/22/16  Yes Yvonne R Lowne Chase, DO  benzonatate (TESSALON) 100 MG capsule Take 1 capsule (100 mg total) by mouth 3 (three) times daily as needed. 07/20/16  Yes Brunetta Jeans, PA-C  doxylamine, Sleep, (UNISOM) 25 MG tablet Take 100 mg by mouth at bedtime as needed for sleep.   Yes Historical Provider, MD  furosemide (LASIX) 20 MG tablet 3 po qd Patient taking differently: Take 60 mg by mouth daily. 3 po qd 10/29/15  Yes Yvonne R Lowne Chase, DO  levothyroxine (SYNTHROID, LEVOTHROID) 50 MCG tablet TAKE ONE TABLET BY MOUTH DAILY 03/24/16  Yes Yvonne R Lowne Chase, DO  losartan (COZAAR) 100 MG tablet TAKE ONE TABLET BY MOUTH ONCE DAILY 06/22/16  Yes Yvonne R Lowne Chase, DO  metoprolol succinate (TOPROL-XL) 50 MG 24 hr tablet TAKE TWO TABLETS BY MOUTH IN THE MORNING TAKE  WITH  OR IMMEDIATELY FOLLOWING A MEAL 06/02/16  Yes Yvonne R Lowne Chase, DO  nystatin (MYCOSTATIN) 100000 UNIT/ML suspension 5 ml swish and spit qid 10/27/15  Yes Yvonne R Lowne Chase, DO  OXYGEN Inhale 3 L into the lungs continuous.    Yes Historical Provider, MD  Potassium Chloride ER 20 MEQ TBCR Take 1 tablet by mouth 2 (two) times daily. Repeat labs  are due now 05/17/16  Yes Rosalita Chessman Chase, DO  promethazine-dextromethorphan (PROMETHAZINE-DM) 6.25-15 MG/5ML syrup Take 5-10 mLs by mouth 4 (four) times daily as needed for cough. 07/22/16  Yes Yvonne R Lowne Chase, DO  QUEtiapine (SEROQUEL) 50 MG tablet Take 2 tablets by mouth at bedtime. 11/09/15  Yes Historical Provider, MD  Vitamin D, Ergocalciferol, (DRISDOL) 50000 units CAPS capsule Take 1 capsule (50,000 Units total) by mouth every 7 (seven) days. 02/01/16  Yes Yvonne R Lowne Chase, DO  zolpidem (AMBIEN) 5 MG tablet TAKE  ONE TABLET BY MOUTH AT BEDTIME 06/14/16  Yes Rosalita Chessman Chase, DO    No Known Allergies  Social History  Substance Use Topics  . Smoking status: Former Smoker    Packs/day: 1.00    Years: 40.00    Types: Cigarettes    Quit date: 10/16/2011  . Smokeless tobacco: Never Used  . Alcohol use Yes     Comment: Occ-- Wine    Family History  Problem Relation Age of Onset  . Heart disease Father     MVP and Pics Valve  . Hypertension Father   . Depression Father     Institutionalized x's 2 years  . Bipolar disorder Father   . Heart disease Paternal Grandmother   . Heart disease Paternal Aunt   . Heart disease Paternal Uncle   . Schizophrenia Paternal Aunt   . Hypertension Sister   . Diabetes Sister   . Hyperlipidemia Sister   . Heart disease Sister 100    MI  . Heart disease Brother   . Hypertension Brother   no cirrhosis, no liver cancer   Objective:  Constitutional: in no apparent distress,  Vitals:   08/01/16 1037  BP: (!) 164/95  Pulse: 84  Temp: 98.4 F (36.9 C)   Eyes: anicteric Cardiovascular: Cor RRR Respiratory: CTA B; normal respiartory effort on 3L [place] Gastrointestinal: Bowel sounds are normal; morbidly obese Musculoskeletal: no pedal edema noted Skin: negatives: no rash; no porphyria cutanea tarda Lymphatic: no cervical lymphadenopathy   Laboratory Genotype: No results found for: HCVGENOTYPE HCV viral load:  Lab Results  Component Value Date   HCVQUANT 6,213,086 (H) 07/19/2016   Lab Results  Component Value Date   WBC 4.9 01/15/2016   HGB 12.6 01/15/2016   HCT 36.9 01/15/2016   MCV 96.6 01/15/2016   PLT 181.0 01/15/2016    Lab Results  Component Value Date   CREATININE 1.98 (H) 07/19/2016   BUN 20 07/19/2016   NA 135 07/19/2016   K 3.7 07/19/2016   CL 99 07/19/2016   CO2 31 07/19/2016    Lab Results  Component Value Date   ALT 20 01/15/2016   AST 49 (H) 01/15/2016   ALKPHOS 105 01/15/2016     Labs and history reviewed and show  CHILD-PUGH A  5-6 points: Child class A 7-9 points: Child class B 10-15 points: Child class C  Lab Results  Component Value Date   INR 1.21 12/15/2015   BILITOT 0.6 01/15/2016   ALBUMIN 3.0 (L) 01/15/2016     Assessment: New Patient with Chronic Hepatitis C genotype unknown, untreated.  I discussed with the patient the lab findings that confirm chronic hepatitis C as well as the natural history and progression of disease including about 30% of people who develop cirrhosis of the liver if left untreated and once cirrhosis is established there is a 2-7% risk per year of liver cancer and liver failure.  I discussed the importance  of treatment and benefits in reducing the risk, even if significant liver fibrosis exists.   Plan: 1) Patient counseled extensively on limiting acetaminophen to no more than 2 grams daily, avoidance of alcohol. 2) Transmission discussed with patient including sexual transmission, sharing razors and toothbrush.   3) Will need referral to gastroenterology if concern for cirrhosis 4) Will need referral for substance abuse counseling: No.; Further work up to include urine drug screen  No. 5) Will prescribe Zepatier for 12 weeks or 16 weeks with ribavirin if any NS5A resistance found; alternatively, will also consider Mavyret if covered by insurance.  Either a good option with a GFR < 50 6) Hepatitis A, B titers 8) Pneumovax vaccine if concern for cirrhosis 9) Further work up to include liver staging with elastography 10) NS5A test  Yes.   10) will follow up after starting medication.

## 2016-08-01 NOTE — Telephone Encounter (Signed)
PATIENT NEEDS TO MAKE AN APPOINTMENT AND BE SEEN BY THE PROVIDER FOR ANY FURTHER REFILLS.

## 2016-08-01 NOTE — Telephone Encounter (Signed)
Patient states she will call back to schedule when she find out what days her daughter can bring her.

## 2016-08-05 LAB — HCV RNA,LIPA RFLX NS5A DRUG RESIST

## 2016-08-07 LAB — HCV RNA NS5A DRUG RESISTANCE

## 2016-08-11 DIAGNOSIS — J9601 Acute respiratory failure with hypoxia: Secondary | ICD-10-CM | POA: Diagnosis not present

## 2016-08-11 DIAGNOSIS — J449 Chronic obstructive pulmonary disease, unspecified: Secondary | ICD-10-CM | POA: Diagnosis not present

## 2016-08-12 ENCOUNTER — Other Ambulatory Visit: Payer: Self-pay | Admitting: Internal Medicine

## 2016-08-12 DIAGNOSIS — B182 Chronic viral hepatitis C: Secondary | ICD-10-CM

## 2016-08-12 MED ORDER — ELBASVIR-GRAZOPREVIR 50-100 MG PO TABS: 1.0000 | ORAL_TABLET | Freq: Every day | ORAL | 2 refills | Status: DC

## 2016-08-16 ENCOUNTER — Ambulatory Visit (HOSPITAL_COMMUNITY): Payer: Commercial Managed Care - HMO

## 2016-08-16 ENCOUNTER — Ambulatory Visit: Payer: Commercial Managed Care - HMO | Admitting: Family Medicine

## 2016-08-22 ENCOUNTER — Ambulatory Visit: Payer: Commercial Managed Care - HMO | Admitting: Family Medicine

## 2016-08-23 ENCOUNTER — Encounter: Payer: Self-pay | Admitting: Family Medicine

## 2016-08-23 ENCOUNTER — Ambulatory Visit (INDEPENDENT_AMBULATORY_CARE_PROVIDER_SITE_OTHER): Payer: Commercial Managed Care - HMO | Admitting: Family Medicine

## 2016-08-23 VITALS — BP 116/70 | HR 90 | Temp 98.0°F | Resp 16 | Ht 63.0 in | Wt 293.6 lb

## 2016-08-23 DIAGNOSIS — E039 Hypothyroidism, unspecified: Secondary | ICD-10-CM

## 2016-08-23 DIAGNOSIS — B9689 Other specified bacterial agents as the cause of diseases classified elsewhere: Secondary | ICD-10-CM

## 2016-08-23 DIAGNOSIS — G4700 Insomnia, unspecified: Secondary | ICD-10-CM

## 2016-08-23 DIAGNOSIS — J208 Acute bronchitis due to other specified organisms: Secondary | ICD-10-CM

## 2016-08-23 DIAGNOSIS — I10 Essential (primary) hypertension: Secondary | ICD-10-CM | POA: Diagnosis not present

## 2016-08-23 DIAGNOSIS — B182 Chronic viral hepatitis C: Secondary | ICD-10-CM

## 2016-08-23 DIAGNOSIS — F411 Generalized anxiety disorder: Secondary | ICD-10-CM

## 2016-08-23 DIAGNOSIS — Z23 Encounter for immunization: Secondary | ICD-10-CM

## 2016-08-23 DIAGNOSIS — F319 Bipolar disorder, unspecified: Secondary | ICD-10-CM | POA: Diagnosis not present

## 2016-08-23 MED ORDER — ZOLPIDEM TARTRATE 5 MG PO TABS: 5.0000 mg | ORAL_TABLET | Freq: Every day | ORAL | 0 refills | Status: DC

## 2016-08-23 MED ORDER — ALBUTEROL SULFATE HFA 108 (90 BASE) MCG/ACT IN AERS: 2.0000 | INHALATION_SPRAY | Freq: Four times a day (QID) | RESPIRATORY_TRACT | 0 refills | Status: DC | PRN

## 2016-08-23 MED ORDER — LEVOTHYROXINE SODIUM 50 MCG PO TABS: 50.0000 ug | ORAL_TABLET | Freq: Every day | ORAL | 11 refills | Status: DC

## 2016-08-23 MED ORDER — ALPRAZOLAM 0.5 MG PO TABS: 0.5000 mg | ORAL_TABLET | Freq: Two times a day (BID) | ORAL | 1 refills | Status: DC | PRN

## 2016-08-23 MED ORDER — METOPROLOL SUCCINATE ER 50 MG PO TB24: ORAL_TABLET | ORAL | 5 refills | Status: DC

## 2016-08-23 MED ORDER — LOSARTAN POTASSIUM 100 MG PO TABS: 100.0000 mg | ORAL_TABLET | Freq: Every day | ORAL | 5 refills | Status: DC

## 2016-08-23 NOTE — Progress Notes (Signed)
Pre visit review using our clinic review tool, if applicable. No additional management support is needed unless otherwise documented below in the visit note. 

## 2016-08-23 NOTE — Progress Notes (Signed)
Patient ID: Sue [name], female    DOB: 05/31/1951  Age: 65 y.o. MRN: [id number]    Subjective:  Subjective  HPI Sue [name] presents for f/u thyroid and hep c.  She has to make a f/u appointment with hep c clinic.    Review of Systems  Constitutional: Negative for appetite change, diaphoresis, fatigue and unexpected weight change.  Eyes: Negative for pain, redness and visual disturbance.  Respiratory: Negative for cough, chest tightness, shortness of breath and wheezing.   Cardiovascular: Negative for chest pain, palpitations and leg swelling.  Endocrine: Negative for cold intolerance, heat intolerance, polydipsia, polyphagia and polyuria.  Genitourinary: Negative for difficulty urinating, dysuria and frequency.  Neurological: Negative for dizziness, light-headedness, numbness and headaches.    History Past Medical History:  Diagnosis Date  . Depression   . Emphysema/COPD ([place])   . GERD (gastroesophageal reflux disease)   . Hypertension   . Stroke [facility]) 2016   TIA   . Urine incontinence     She has a past surgical history that includes Abdominal hysterectomy and Cesarean section.   Her family history includes Bipolar disorder in her father; Depression in her father; Diabetes in her sister; Heart disease in her brother, father, paternal aunt, paternal grandmother, and paternal uncle; Heart disease (age of onset: 71) in her sister; Hyperlipidemia in her sister; Hypertension in her brother, father, and sister; Schizophrenia in her paternal aunt.She reports that she quit smoking about 4 years ago. Her smoking use included Cigarettes. She has a 40.00 pack-year smoking history. She has never used smokeless tobacco. She reports that she drinks alcohol. She reports that she does not use drugs.  Current Outpatient Prescriptions on File Prior to Visit  Medication Sig Dispense Refill  . aspirin EC 325 MG EC tablet Take 1 tablet (325 mg total) by mouth daily. 60 tablet 0  . doxylamine,  Sleep, (UNISOM) 25 MG tablet Take 100 mg by mouth at bedtime as needed for sleep.    . Elbasvir-Grazoprevir (ZEPATIER) 50-100 MG TABS Take 1 tablet by mouth daily. 28 tablet 2  . furosemide (LASIX) 20 MG tablet 3 po qd (Patient taking differently: Take 60 mg by mouth daily. 3 po qd) 270 tablet 1  . nystatin (MYCOSTATIN) 100000 UNIT/ML suspension 5 ml swish and spit qid 60 mL 0  . OXYGEN Inhale 3 L into the lungs continuous.     . Potassium Chloride ER 20 MEQ TBCR TAKE ONE TABLET BY MOUTH TWICE DAILY. REPEAT LABS ARE DUE NOW. 30 tablet 0  . promethazine-dextromethorphan (PROMETHAZINE-DM) 6.25-15 MG/5ML syrup Take 5-10 mLs by mouth 4 (four) times daily as needed for cough. 118 mL 0  . QUEtiapine (SEROQUEL) 50 MG tablet Take 2 tablets by mouth at bedtime.  3  . Vitamin D, Ergocalciferol, (DRISDOL) 50000 units CAPS capsule Take 1 capsule (50,000 Units total) by mouth every 7 (seven) days. 4 capsule 2  . benzonatate (TESSALON) 100 MG capsule Take 1 capsule (100 mg total) by mouth 3 (three) times daily as needed. (Patient not taking: Reported on 08/23/2016) 30 capsule 0   No current facility-administered medications on file prior to visit.      Objective:  Objective  Physical Exam  Constitutional: She is oriented to person, place, and time. She appears well-developed and well-nourished.  HENT:  Head: Normocephalic and atraumatic.  Eyes: Conjunctivae and EOM are normal.  Neck: Normal range of motion. Neck supple. No JVD present. Carotid bruit is not present. No thyromegaly present.  Cardiovascular:  Normal rate, regular rhythm and normal heart sounds.   No murmur heard. Pulmonary/Chest: Effort normal and breath sounds normal. No respiratory distress. She has no wheezes. She has no rales. She exhibits no tenderness.  Musculoskeletal: She exhibits no edema.  Neurological: She is alert and oriented to person, place, and time.  Psychiatric: She has a normal mood and affect. Her behavior is normal.  Judgment and thought content normal.  Nursing note and vitals reviewed.  BP 116/70 (BP Location: Right Arm, Patient Position: Sitting, Cuff Size: Large)   Pulse 90   Temp 98 F (36.7 C) (Oral)   Resp 16   Ht 5\' 3"  (1.6 m)   Wt 293 lb 9.6 oz (133.2 kg)   SpO2 95%   PF (!) 3 L/min   BMI 52.01 kg/m  Wt Readings from Last 3 Encounters:  08/23/16 293 lb 9.6 oz (133.2 kg)  08/01/16 296 lb (134.3 kg)  07/19/16 292 lb 4 oz (132.6 kg)     Lab Results  Component Value Date   WBC 4.9 01/15/2016   HGB 12.6 01/15/2016   HCT 36.9 01/15/2016   PLT 181.0 01/15/2016   GLUCOSE 81 08/01/2016   CHOL 98 01/15/2016   TRIG 66.0 01/15/2016   HDL 22.60 (L) 01/15/2016   LDLCALC 62 01/15/2016   ALT 20 01/15/2016   AST 49 (H) 01/15/2016   NA 138 08/01/2016   K 3.7 08/01/2016   CL 101 08/01/2016   CREATININE 1.08 (H) 08/01/2016   BUN 11 08/01/2016   CO2 32 (H) 08/01/2016   TSH 3.77 07/19/2016   INR 1.21 12/15/2015   HGBA1C 5.8 07/19/2016    Dg Foot Complete Left  Result Date: 07/20/2016 CLINICAL DATA:  Trauma involving the fifth toe 2 months ago with persistent pain and tenderness. EXAM: LEFT FOOT - COMPLETE 3+ VIEW COMPARISON:  None in PACs FINDINGS: There is irregularity of the DIP joint of the fifth toe. This may be degenerative or post traumatic. No definite fifth toe fracture is observed. The PIP joint and the MTP joint of the fifth toe are normal. The other phalanges are unremarkable. The tarsals and metatarsals appear normal. IMPRESSION: Degenerative versus post traumatic change of the DIP joint of the left fifth toe. No definite acute or healing fracture. But the contour of the distal phalanx is irregular. Electronically Signed   By: David  Martinique M.D.   On: 07/20/2016 08:24     Assessment & Plan:  Plan  I have discontinued Ms. Ragsdale's azithromycin. I have also changed her ALPRAZolam, losartan, and zolpidem. Additionally, I am having her maintain her OXYGEN, aspirin, nystatin,  furosemide, QUEtiapine, doxylamine (Sleep), Vitamin D (Ergocalciferol), benzonatate, promethazine-dextromethorphan, Potassium Chloride ER, Elbasvir-Grazoprevir, albuterol, levothyroxine, and metoprolol succinate.  Meds ordered this encounter  Medications  . albuterol (PROVENTIL HFA;VENTOLIN HFA) 108 (90 Base) MCG/ACT inhaler    Sig: Inhale 2 puffs into the lungs every 6 (six) hours as needed for wheezing or shortness of breath.    Dispense:  1 Inhaler    Refill:  0  . DISCONTD: levothyroxine (SYNTHROID, LEVOTHROID) 50 MCG tablet    Sig: Take 1 tablet (50 mcg total) by mouth daily.    Dispense:  30 tablet    Refill:  11  . levothyroxine (SYNTHROID, LEVOTHROID) 50 MCG tablet    Sig: Take 1 tablet (50 mcg total) by mouth daily.    Dispense:  30 tablet    Refill:  11  . ALPRAZolam (XANAX) 0.5 MG tablet  Sig: Take 1 tablet (0.5 mg total) by mouth 2 (two) times daily as needed. for anxiety    Dispense:  60 tablet    Refill:  1  . losartan (COZAAR) 100 MG tablet    Sig: Take 1 tablet (100 mg total) by mouth daily.    Dispense:  30 tablet    Refill:  5    Please consider 90 day supplies to promote better adherence  . metoprolol succinate (TOPROL-XL) 50 MG 24 hr tablet    Sig: TAKE TWO TABLETS BY MOUTH IN THE MORNING TAKE  WITH  OR IMMEDIATELY FOLLOWING A MEAL    Dispense:  60 tablet    Refill:  5  . zolpidem (AMBIEN) 5 MG tablet    Sig: Take 1 tablet (5 mg total) by mouth at bedtime.    Dispense:  30 tablet    Refill:  0    Problem List Items Addressed This Visit      Unprioritized   Insomnia   Relevant Medications   zolpidem (AMBIEN) 5 MG tablet   Hypothyroidism - Primary   Relevant Medications   levothyroxine (SYNTHROID, LEVOTHROID) 50 MCG tablet   metoprolol succinate (TOPROL-XL) 50 MG 24 hr tablet    Other Visit Diagnoses    Acute bacterial bronchitis       Relevant Medications   albuterol (PROVENTIL HFA;VENTOLIN HFA) 108 (90 Base) MCG/ACT inhaler   Essential  hypertension       Relevant Medications   losartan (COZAAR) 100 MG tablet   metoprolol succinate (TOPROL-XL) 50 MG 24 hr tablet   Generalized anxiety disorder       Relevant Medications   ALPRAZolam (XANAX) 0.5 MG tablet      Follow-up: No Follow-up on file.  Ann Held, DO

## 2016-08-23 NOTE — Assessment & Plan Note (Signed)
Per hep c clinic

## 2016-08-23 NOTE — Patient Instructions (Signed)
Hepatitis C Hepatitis C is a viral infection of the liver. It can lead to scarring of the liver (cirrhosis), liver failure, or liver cancer. Hepatitis C may go undetected for months or years because people with the infection may not have symptoms, or they may have only mild symptoms. What are the causes? Hepatitis C is caused by the hepatitis C virus (HCV). The virus can be passed from one person to another through:  Blood.  Contaminated needles, such as those used for tattooing, body piercing, acupuncture, or injecting drugs.  Having unprotected sex with an infected person.  Childbirth.  Blood transfusions or organ transplants done in the [place] before 1992. What increases the risk? Risk factors for hepatitis C include:  Having unprotected sex with an infected person.  Using illegal drugs. What are the signs or symptoms? Symptoms of hepatitis C may include:  Fatigue.  Loss of appetite.  Nausea.  Vomiting.  Abdominal pain.  Dark yellow urine.  Yellowish skin and eyes (jaundice).  Itching of the skin.  Clay-colored bowel movements.  Joint pain. Symptoms are not always present. How is this diagnosed? Hepatitis C is diagnosed with blood tests. Other types of tests may also be done to check how your liver is functioning. How is this treated? Your health care provider may perform noninvasive tests or a liver biopsy to help determine the best course of treatment. Treatment for hepatitis C may include one or more medicines. Your health care provider may check you for a recurring infection or other liver conditions every 6-12 months after treatment. Follow these instructions at home:  Rest as needed.  Take all medicines as directed by your health care provider.  Do not take any medicine unless approved by your health care provider. This includes over-the-counter medicine and birth control pills.  Do not drink alcohol.  Do not have sex until approved by your  health care provider.  Do not share toothbrushes, nail clippers, razors, or needles. How is this prevented? There is no vaccine for hepatitis C. The only way to prevent the disease is to reduce the risk of exposure to the virus. This may be done by:  Practicing safe sex and using condoms.  Avoiding illegal drugs. Contact a health care provider if:  You have a fever.  You develop abdominal pain.  You develop dark urine.  You have clay-colored bowel movements.  You develop joint pains. Get help right away if:  You have increasing fatigue or weakness.  You lose your appetite.  You feel nauseous or vomit.  You develop jaundice or your jaundice gets worse.  You bruise or bleed easily. This information is not intended to replace advice given to you by your health care provider. Make sure you discuss any questions you have with your health care provider. Document Released: 08/26/2000 Document Revised: 02/04/2016 Document Reviewed: 12/11/2013 Elsevier Interactive Patient Education  2017 [employer].

## 2016-08-25 NOTE — Assessment & Plan Note (Signed)
con't synthroid Check labs  

## 2016-08-25 NOTE — Assessment & Plan Note (Signed)
sstable con't meds

## 2016-09-01 ENCOUNTER — Other Ambulatory Visit: Payer: Self-pay | Admitting: Family Medicine

## 2016-09-01 DIAGNOSIS — F411 Generalized anxiety disorder: Secondary | ICD-10-CM

## 2016-09-02 NOTE — Telephone Encounter (Signed)
See rx. 

## 2016-09-02 NOTE — Telephone Encounter (Signed)
Faxed Rx to pharmacy. Confirmation received. LB

## 2016-09-02 NOTE — Telephone Encounter (Signed)
Last ov 08/23/16. Last fill 08/03/16 #60 0, per Pharmacist at [facility]. Please advise. LB

## 2016-09-11 DIAGNOSIS — J9601 Acute respiratory failure with hypoxia: Secondary | ICD-10-CM | POA: Diagnosis not present

## 2016-09-11 DIAGNOSIS — J449 Chronic obstructive pulmonary disease, unspecified: Secondary | ICD-10-CM | POA: Diagnosis not present

## 2016-09-21 ENCOUNTER — Other Ambulatory Visit: Payer: Self-pay | Admitting: Family Medicine

## 2016-10-12 DIAGNOSIS — J9601 Acute respiratory failure with hypoxia: Secondary | ICD-10-CM | POA: Diagnosis not present

## 2016-10-12 DIAGNOSIS — J449 Chronic obstructive pulmonary disease, unspecified: Secondary | ICD-10-CM | POA: Diagnosis not present

## 2016-11-01 ENCOUNTER — Other Ambulatory Visit: Payer: Self-pay | Admitting: Family Medicine

## 2016-11-01 ENCOUNTER — Telehealth: Payer: Self-pay | Admitting: Family Medicine

## 2016-11-01 DIAGNOSIS — M79676 Pain in unspecified toe(s): Secondary | ICD-10-CM

## 2016-11-01 DIAGNOSIS — F411 Generalized anxiety disorder: Secondary | ICD-10-CM

## 2016-11-01 MED ORDER — ALPRAZOLAM 0.5 MG PO TABS: 0.5000 mg | ORAL_TABLET | Freq: Two times a day (BID) | ORAL | 0 refills | Status: DC | PRN

## 2016-11-01 NOTE — Telephone Encounter (Signed)
Last office visit 08/23/2016--no upcoming scheduled appt. Last refill  09/07/2016  #60 with 0 refills No UDS/ No Contra  Called the patient left message to call us back with more specifics of the referral for her toe

## 2016-11-01 NOTE — Addendum Note (Signed)
Addended by: Sharon Seller B on: 11/01/2016 07:11 PM   Modules accepted: Orders

## 2016-11-01 NOTE — Telephone Encounter (Signed)
Faxed hardcopy [facility] [address]

## 2016-11-01 NOTE — Telephone Encounter (Signed)
Called left message to call back 

## 2016-11-01 NOTE — Telephone Encounter (Signed)
Ok to refill 

## 2016-11-01 NOTE — Telephone Encounter (Signed)
Referral done. She also wanted alprazolam refill--last refill information included in note

## 2016-11-01 NOTE — Telephone Encounter (Signed)
OK FOR REFERRAL

## 2016-11-01 NOTE — Telephone Encounter (Signed)
Caller name: Relationship to patient: Self Can be reached: [phone]  Pharmacy:  Reason for call: Patient request a referral to Orthopedics for her Toe.  Also needs a refill on ALPRAZolam Duanne Moron) 0.5 MG tablet [facility]  [id number] [address], [place] (Phone) [phone] (Fax)

## 2016-11-02 ENCOUNTER — Encounter: Payer: Self-pay | Admitting: Podiatry

## 2016-11-02 ENCOUNTER — Telehealth: Payer: Self-pay | Admitting: Family Medicine

## 2016-11-02 ENCOUNTER — Ambulatory Visit (INDEPENDENT_AMBULATORY_CARE_PROVIDER_SITE_OTHER): Payer: Medicare HMO | Admitting: Podiatry

## 2016-11-02 ENCOUNTER — Ambulatory Visit: Payer: Self-pay | Admitting: Podiatry

## 2016-11-02 VITALS — Ht 63.0 in | Wt 293.0 lb

## 2016-11-02 DIAGNOSIS — Q828 Other specified congenital malformations of skin: Secondary | ICD-10-CM | POA: Diagnosis not present

## 2016-11-02 DIAGNOSIS — M79672 Pain in left foot: Secondary | ICD-10-CM | POA: Diagnosis not present

## 2016-11-02 DIAGNOSIS — M2042 Other hammer toe(s) (acquired), left foot: Secondary | ICD-10-CM | POA: Diagnosis not present

## 2016-11-02 NOTE — Progress Notes (Signed)
SUBJECTIVE: 66 y.o. year old female presents stating that she hit her 5th toe 2 months ago, beginning of December 2017 and pain is increasing. Anything touch make the toe hurt. Pain goes up to her leg. She has taken x-ray of the foot a couple of weeks after the incident.  Patient wishes to have corrective procedure done for the painful toe 5th left if possible. Patient is wearing O2 tube.  REVIEW OF SYSTEMS: Positive for Hypertension, COPD requiring O2 therapy, Thyroid problem, taking Lasix for CHF, severe bilateral knee pain with difficulty walking with heavy weight (290 lbs).  OBJECTIVE: DERMATOLOGIC EXAMINATION: Positive for tyloma at distal lateral tip of 5th digit left with mild erythema.  VASCULAR EXAMINATION OF LOWER LIMBS: All pedal pulses are palpable with normal pulsation.  Capillary Filling times within 3 seconds in all digits.  Positive for mild edema 5th digit left. Positive for mild lower limb edema. Temperature gradient from tibial crest to dorsum of foot is within normal bilateral.  NEUROLOGIC EXAMINATION OF THE LOWER LIMBS: Achilles DTR is present and within normal. Monofilament (Semmes-Weinstein 10-gm) sensory testing positive 6 out of 6, bilateral. Vibratory sensations(128Hz  turning fork) intact at medial and lateral forefoot bilateral.  Sharp and Dull discriminatory sensations at the plantar ball of hallux is intact bilateral.   MUSCULOSKELETAL EXAMINATION: Positive for enlarged and varus rotated 5th digit left with protruding tyloma at distal lateral tip left foot.  ASSESSMENT: S/P Injury 5th toe left. Varus rotated 5th toe left. Tyloma distal lateral 5th digit left. Pain with ambulation.  PLAN: Reviewed clinical findings and available treatment options. Lesion debrided and padded. Return in 2 weeks for evaluation for possible surgical intervention.

## 2016-11-02 NOTE — Telephone Encounter (Signed)
Pt called asking for referral-- that is why it was put in

## 2016-11-02 NOTE — Patient Instructions (Signed)
Seen for painful toe 5th left. Noted of thick keratotic lesion pressing against the bone on little toe. All debrided and padded. Return in 2 weeks.

## 2016-11-04 ENCOUNTER — Ambulatory Visit (HOSPITAL_COMMUNITY)
Admission: RE | Admit: 2016-11-04 | Discharge: 2016-11-04 | Disposition: A | Discharge status: Home / Self Care | Payer: Medicare HMO | Source: Ambulatory Visit | Attending: Internal Medicine | Admitting: Internal Medicine

## 2016-11-04 DIAGNOSIS — B182 Chronic viral hepatitis C: Secondary | ICD-10-CM | POA: Insufficient documentation

## 2016-11-04 DIAGNOSIS — K8020 Calculus of gallbladder without cholecystitis without obstruction: Secondary | ICD-10-CM | POA: Insufficient documentation

## 2016-11-04 DIAGNOSIS — B1920 Unspecified viral hepatitis C without hepatic coma: Secondary | ICD-10-CM | POA: Diagnosis not present

## 2016-11-08 ENCOUNTER — Telehealth: Payer: Self-pay | Admitting: *Deleted

## 2016-11-08 NOTE — Telephone Encounter (Signed)
Patient calling for ultrasound results. Please advise

## 2016-11-08 NOTE — Telephone Encounter (Signed)
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  Sue [name] - did she get approved for treatment?

## 2016-11-09 DIAGNOSIS — J449 Chronic obstructive pulmonary disease, unspecified: Secondary | ICD-10-CM | POA: Diagnosis not present

## 2016-11-09 DIAGNOSIS — J9601 Acute respiratory failure with hypoxia: Secondary | ICD-10-CM | POA: Diagnosis not present

## 2016-11-09 NOTE — Telephone Encounter (Signed)
Patient notified and knows that Inez Catalina is working on getting her medication approved.

## 2016-11-11 MED FILL — ZEPATIER 50-100 MG TABLET: 50-100 | 28 days supply | Qty: 28 | Fill #0

## 2016-11-14 ENCOUNTER — Encounter (INDEPENDENT_AMBULATORY_CARE_PROVIDER_SITE_OTHER): Payer: Self-pay | Admitting: Orthopaedic Surgery

## 2016-11-14 ENCOUNTER — Ambulatory Visit (INDEPENDENT_AMBULATORY_CARE_PROVIDER_SITE_OTHER): Payer: Medicare HMO | Admitting: Orthopaedic Surgery

## 2016-11-14 DIAGNOSIS — G8929 Other chronic pain: Secondary | ICD-10-CM | POA: Insufficient documentation

## 2016-11-14 DIAGNOSIS — M79675 Pain in left toe(s): Secondary | ICD-10-CM | POA: Diagnosis not present

## 2016-11-14 NOTE — Progress Notes (Signed)
Office Visit Note   Patient: Sue [name]           Date of Birth: Dec 17, 1950           MRN: [id number] Visit Date: 11/14/2016              Requested by: Ann Held, DO [address] [place], [place] [address] PCP: Ann Held, DO   Assessment & Plan: Visit Diagnoses:  1. Chronic pain of toe of left foot     Plan: X-rays back in November reviewed shows a healed distal phalanx fracture. Mild degenerative changes within the DIP joint. I think her issue is more from the callus. I recommend follow up with her podiatrist for periodic callus trimming. We did give her a toe sleeve in order to cushion the callus. Follow up with me as needed.  Follow-Up Instructions: Return if symptoms worsen or fail to improve.   Orders:  No orders of the defined types were placed in this encounter.  No orders of the defined types were placed in this encounter.     Procedures: No procedures performed   Clinical Data: No additional findings.   Subjective: Chief Complaint  Patient presents with  . Left 5th Toe - Pain    Patient is a very pleasant 66 year old female comes in with left small toe pain and callus. She's had this callus trimmed a couple times. She did have an injury back in November in which she fractured her small toe. This did not require any surgery. She continues to have pain. Denies any constitutional symptoms. Pain does not radiate. Pain is worse with shoewear.    Review of Systems  Constitutional: Negative.   HENT: Negative.   Eyes: Negative.   Respiratory: Negative.   Cardiovascular: Negative.   Endocrine: Negative.   Musculoskeletal: Negative.   Neurological: Negative.   Hematological: Negative.   Psychiatric/Behavioral: Negative.   All other systems reviewed and are negative.    Objective: Vital Signs: There were no vitals taken for this visit.  Physical Exam  Constitutional: She is oriented to person, place, and time. She  appears well-developed and well-nourished.  HENT:  Head: Normocephalic and atraumatic.  Eyes: EOM are normal.  Neck: Neck supple.  Pulmonary/Chest: Effort normal.  Abdominal: Soft.  Neurological: She is alert and oriented to person, place, and time.  Skin: Skin is warm. Capillary refill takes less than 2 seconds.  Psychiatric: She has a normal mood and affect. Her behavior is normal. Judgment and thought content normal.  Nursing note and vitals reviewed.   Ortho Exam Exam of the left small toe shows no swelling. Normal alignment. No pain with DIP motion. Specialty Comments:  No specialty comments available.  Imaging: No results found.   PMFS History: Patient Active Problem List   Diagnosis Date Noted  . Chronic pain of toe of left foot 11/14/2016  . Chronic hepatitis C without hepatic coma ([place]) 08/01/2016  . Hypothyroidism 10/29/2015  . Mild diastolic dysfunction 32/35/5732  . Edema 01/01/2015  . Pedal edema 12/24/2014  . TIA (transient ischemic attack) 07/30/2014  . Weakness 07/29/2014  . Acute sinusitis 01/29/2014  . Flank pain 11/12/2013  . UTI symptoms 11/12/2013  . Chronic respiratory failure ([place]) 10/25/2013  . Dyspnea 10/25/2013  . Obesity (BMI 30-39.9) 10/15/2013  . Depression with anxiety 10/15/2013  . Insomnia 10/15/2013  . Acute respiratory failure with hypoxia ([place]) 10/09/2013  . Acute renal failure ([place]) 10/09/2013  .  HTN (hypertension) 10/09/2013  . Hypokalemia 10/09/2013   Past Medical History:  Diagnosis Date  . Depression   . Emphysema/COPD ([place])   . GERD (gastroesophageal reflux disease)   . Hypertension   . Stroke [facility]) 2016   TIA   . Urine incontinence     Family History  Problem Relation Age of Onset  . Heart disease Father     MVP and Pics Valve  . Hypertension Father   . Depression Father     Institutionalized x's 2 years  . Bipolar disorder Father   . Heart disease Paternal Grandmother   . Heart disease Paternal Aunt   . Heart  disease Paternal Uncle   . Schizophrenia Paternal Aunt   . Hypertension Sister   . Diabetes Sister   . Hyperlipidemia Sister   . Heart disease Sister 27    MI  . Heart disease Brother   . Hypertension Brother     Past Surgical History:  Procedure Laterality Date  . ABDOMINAL HYSTERECTOMY    . CESAREAN SECTION     Social History   Occupational History  . disabled- Occupational hygienist Med    Social History Main Topics  . Smoking status: Former Smoker    Packs/day: 1.00    Years: 40.00    Types: Cigarettes    Quit date: 10/16/2011  . Smokeless tobacco: Never Used  . Alcohol use Yes     Comment: Occ-- Wine  . Drug use: No  . Sexual activity: Not on file

## 2016-11-16 ENCOUNTER — Encounter: Payer: Self-pay | Admitting: Pharmacy Technician

## 2016-11-16 ENCOUNTER — Ambulatory Visit: Payer: Medicare HMO | Admitting: Podiatry

## 2016-11-18 ENCOUNTER — Other Ambulatory Visit: Payer: Self-pay | Admitting: Family Medicine

## 2016-11-18 NOTE — Telephone Encounter (Signed)
Patient called and informed last refill was done on 11/01/2016 and so is early.   She stated she will call back when due.

## 2016-11-18 NOTE — Telephone Encounter (Signed)
Self.  Refill request ALPRAZolam   Pharmacy: [phone]

## 2016-11-29 ENCOUNTER — Other Ambulatory Visit: Payer: Self-pay | Admitting: Family Medicine

## 2016-11-29 DIAGNOSIS — F411 Generalized anxiety disorder: Secondary | ICD-10-CM

## 2016-11-29 NOTE — Telephone Encounter (Signed)
Faxed hardcopy for Alprazolam to [facility]

## 2016-11-29 NOTE — Telephone Encounter (Signed)
Requesting:   alprazolam Contract    None UDS    None Last OV   08/23/2016---no future appts scheduled Last Refill   #60 on 11/02/2015  Please Advise

## 2016-12-05 ENCOUNTER — Other Ambulatory Visit: Payer: Self-pay | Admitting: *Deleted

## 2016-12-05 DIAGNOSIS — I10 Essential (primary) hypertension: Secondary | ICD-10-CM

## 2016-12-05 MED ORDER — LOSARTAN POTASSIUM 100 MG PO TABS: 100.0000 mg | ORAL_TABLET | Freq: Every day | ORAL | 0 refills | Status: DC

## 2016-12-06 ENCOUNTER — Ambulatory Visit: Payer: Medicare HMO

## 2016-12-08 ENCOUNTER — Other Ambulatory Visit: Payer: Self-pay | Admitting: Pharmacist

## 2016-12-08 MED FILL — ZEPATIER 50-100 MG TABLET: 50-100 | 28 days supply | Qty: 28 | Fill #1

## 2016-12-10 DIAGNOSIS — J449 Chronic obstructive pulmonary disease, unspecified: Secondary | ICD-10-CM | POA: Diagnosis not present

## 2016-12-10 DIAGNOSIS — J9601 Acute respiratory failure with hypoxia: Secondary | ICD-10-CM | POA: Diagnosis not present

## 2016-12-13 ENCOUNTER — Ambulatory Visit (INDEPENDENT_AMBULATORY_CARE_PROVIDER_SITE_OTHER): Payer: Medicare HMO | Admitting: Pharmacist

## 2016-12-13 DIAGNOSIS — B182 Chronic viral hepatitis C: Secondary | ICD-10-CM

## 2016-12-13 MED ORDER — ONDANSETRON 4 MG PO TBDP: 4.0000 mg | ORAL_TABLET | Freq: Three times a day (TID) | ORAL | 0 refills | Status: DC | PRN

## 2016-12-13 NOTE — Progress Notes (Signed)
HPI: Sue [name] is a 66 y.o. female who presents to the [place] clinic today for follow-up of her Hep C infection.  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   No results found for: HCVGENOTYPE, HEPCGENOTYPE  Allergies: No Known Allergies  Past Medical History: Past Medical History:  Diagnosis Date  . Depression   . Emphysema/COPD ([place])   . GERD (gastroesophageal reflux disease)   . Hypertension   . Stroke [facility]) 2016   TIA   . Urine incontinence     Social History: Social History   Social History  . Marital status: Single    Spouse name: N/A  . Number of children: N/A  . Years of education: N/A   Occupational History  . disabled- Occupational hygienist Med    Social History Main Topics  . Smoking status: Former Smoker    Packs/day: 1.00    Years: 40.00    Types: Cigarettes    Quit date: 10/16/2011  . Smokeless tobacco: Never Used  . Alcohol use Yes     Comment: Occ-- Wine  . Drug use: No  . Sexual activity: Not on file   Other Topics Concern  . Not on file   Social History Narrative  . No narrative on file    Labs: Hep B S Ab (no units)  Date Value  08/01/2016 INDETER (A)   Hepatitis B Surface Ag (no units)  Date Value  08/01/2016 NEGATIVE   HCV Ab (no units)  Date Value  07/19/2016 REACTIVE (A)    No results found for: HCVGENOTYPE, HEPCGENOTYPE  Hepatitis C RNA quantitative Latest Ref Rng & Units 07/19/2016 01/15/2016  HCV Quantitative <15 IU/mL 0,034,917(H) CANCELED  HCV Quantitative Log <1.18 log 10 6.69(H) CANCELED    AST (U/L)  Date Value  01/15/2016 49 (H)  12/15/2015 67 (H)  10/27/2015 45 (H)   ALT (U/L)  Date Value  01/15/2016 20  12/15/2015 26  10/27/2015 22   INR (no units)  Date Value  12/15/2015 1.21  07/30/2014 1.12  07/29/2014 1.13    CrCl: CrCl cannot be calculated (Patient's most recent lab result is older than the maximum 21 days allowed.).  Fibrosis Score: F3/F4 as assessed by elastography    Child-Pugh Score: A  Previous Treatment Regimen: None  Assessment: Sue [name] is here today for Hep C follow-up.  She started Zepatier ~1 month ago.  She admits to missing 1 dose due to being out of the house around 7pm when she usually takes it.  She states that is the only dose she has missed.  I reinforced the importance of compliance and not missing doses.  She does have some nausea with taking the medication but not with every dose. [facility].  I explained her Hep C viral load and fibrosis score to her. I will get a Hep C VL today and make her f/u appointments. I will make her cure visits when she follows up at [facility].   Plans: - Continue Zepatier x 12 weeks - Hep C viral load today - F/u with me [facility] 6/5 at 11am  Cassie L. Kuppelweiser, PharmD, [facility] 12/13/2016, 4:13 PM

## 2016-12-15 LAB — HEPATITIS C RNA QUANTITATIVE
HCV Quantitative Log: <1.18 Log IU/mL
HCV Quantitative: <15 IU/mL

## 2016-12-29 ENCOUNTER — Telehealth: Payer: Self-pay | Admitting: Family Medicine

## 2016-12-29 DIAGNOSIS — F411 Generalized anxiety disorder: Secondary | ICD-10-CM

## 2016-12-29 NOTE — Telephone Encounter (Signed)
F[facility] [address]

## 2016-12-29 NOTE — Telephone Encounter (Signed)
Last refill  11/29/2016  #60 with 0 refills Last office visit 08/23/2016 No Contract/no UDS

## 2017-01-09 DIAGNOSIS — J9601 Acute respiratory failure with hypoxia: Secondary | ICD-10-CM | POA: Diagnosis not present

## 2017-01-09 DIAGNOSIS — J449 Chronic obstructive pulmonary disease, unspecified: Secondary | ICD-10-CM | POA: Diagnosis not present

## 2017-01-10 ENCOUNTER — Telehealth: Payer: Self-pay | Admitting: Family Medicine

## 2017-01-10 ENCOUNTER — Other Ambulatory Visit: Payer: Self-pay | Admitting: Pharmacist

## 2017-01-10 DIAGNOSIS — B182 Chronic viral hepatitis C: Secondary | ICD-10-CM

## 2017-01-10 MED ORDER — ELBASVIR-GRAZOPREVIR 50-100 MG PO TABS: 1.0000 | ORAL_TABLET | Freq: Every day | ORAL | 0 refills | Status: DC

## 2017-01-10 MED FILL — ZEPATIER 50-100 MG TABLET: 50-100 | 28 days supply | Qty: 28 | Fill #0

## 2017-01-10 NOTE — Telephone Encounter (Signed)
Called the pharmacy and will disregard this message Patient just picked up a 30 day supply on 12/29/16

## 2017-01-10 NOTE — Telephone Encounter (Addendum)
[id number] [address], [place] [facility] [place] requesting new rX for ALPRAZolam (XANAX) 0.5 MG tablet 60 tablets,pharmacy states they have a rx indicating 30 tablets, please clarify.

## 2017-01-10 NOTE — Telephone Encounter (Signed)
Patient stated that she only got a 15 day supply of potassium and wanted to know why and if she should stay on med.  Per Dr. Etter Sjogren patient is to hold off since potassium was in normal range and we will recheck next month.  Patient notified.

## 2017-01-10 NOTE — Telephone Encounter (Signed)
Caller name: Relationship to patient: Self Can be reached: [phone]  Pharmacy:  Reason for call: Patient request a call back to discuss Potassium.

## 2017-01-23 ENCOUNTER — Other Ambulatory Visit: Payer: Self-pay | Admitting: Family Medicine

## 2017-01-23 DIAGNOSIS — I10 Essential (primary) hypertension: Secondary | ICD-10-CM

## 2017-01-31 ENCOUNTER — Ambulatory Visit (INDEPENDENT_AMBULATORY_CARE_PROVIDER_SITE_OTHER): Payer: Medicare HMO | Admitting: Family Medicine

## 2017-01-31 VITALS — BP 110/80 | HR 79 | Temp 98.1°F | Resp 16 | Ht 63.0 in | Wt 294.2 lb

## 2017-01-31 DIAGNOSIS — M791 Myalgia, unspecified site: Secondary | ICD-10-CM

## 2017-01-31 DIAGNOSIS — J9611 Chronic respiratory failure with hypoxia: Secondary | ICD-10-CM

## 2017-01-31 DIAGNOSIS — J301 Allergic rhinitis due to pollen: Secondary | ICD-10-CM | POA: Diagnosis not present

## 2017-01-31 DIAGNOSIS — R0602 Shortness of breath: Secondary | ICD-10-CM | POA: Diagnosis not present

## 2017-01-31 DIAGNOSIS — J449 Chronic obstructive pulmonary disease, unspecified: Secondary | ICD-10-CM

## 2017-01-31 DIAGNOSIS — F418 Other specified anxiety disorders: Secondary | ICD-10-CM | POA: Diagnosis not present

## 2017-01-31 DIAGNOSIS — M2550 Pain in unspecified joint: Secondary | ICD-10-CM

## 2017-01-31 DIAGNOSIS — E039 Hypothyroidism, unspecified: Secondary | ICD-10-CM

## 2017-01-31 DIAGNOSIS — Z1239 Encounter for other screening for malignant neoplasm of breast: Secondary | ICD-10-CM

## 2017-01-31 DIAGNOSIS — Z0000 Encounter for general adult medical examination without abnormal findings: Secondary | ICD-10-CM

## 2017-01-31 MED ORDER — FLUTICASONE PROPIONATE 50 MCG/ACT NA SUSP: 2.0000 | Freq: Every day | NASAL | 6 refills | Status: DC

## 2017-01-31 MED ORDER — LEVOCETIRIZINE DIHYDROCHLORIDE 5 MG PO TABS: 5.0000 mg | ORAL_TABLET | Freq: Every evening | ORAL | 5 refills | Status: DC

## 2017-01-31 NOTE — Patient Instructions (Signed)
Joint Pain Joint pain, which is also called arthralgia, can be caused by many things. Joint pain often goes away when you follow your health care provider's instructions for relieving pain at home. However, joint pain can also be caused by conditions that require further treatment. Common causes of joint pain include:  Bruising in the area of the joint.  Overuse of the joint.  Wear and tear on the joints that occur with aging (osteoarthritis).  Various other forms of arthritis.  A buildup of a crystal form of uric acid in the joint (gout).  Infections of the joint (septic arthritis) or of the bone (osteomyelitis). Your health care provider may recommend medicine to help with the pain. If your joint pain continues, additional tests may be needed to diagnose your condition. Follow these instructions at home: Watch your condition for any changes. Follow these instructions as directed to lessen the pain that you are feeling.  Take medicines only as directed by your health care provider.  Rest the affected area for as long as your health care provider says that you should. If directed to do so, raise the painful joint above the level of your heart while you are sitting or lying down.  Do not do things that cause or worsen pain.  If directed, apply ice to the painful area:  Put ice in a plastic bag.  Place a towel between your skin and the bag.  Leave the ice on for 20 minutes, 2-3 times per day.  Wear an elastic bandage, splint, or sling as directed by your health care provider. Loosen the elastic bandage or splint if your fingers or toes become numb and tingle, or if they turn cold and blue.  Begin exercising or stretching the affected area as directed by your health care provider. Ask your health care provider what types of exercise are safe for you.  Keep all follow-up visits as directed by your health care provider. This is important. Contact a health care provider if:  Your  pain increases, and medicine does not help.  Your joint pain does not improve within 3 days.  You have increased bruising or swelling.  You have a fever.  You lose 10 lb (4.5 kg) or more without trying. Get help right away if:  You are not able to move the joint.  Your fingers or toes become numb or they turn cold and blue. This information is not intended to replace advice given to you by your health care provider. Make sure you discuss any questions you have with your health care provider. Document Released: 08/29/2005 Document Revised: 01/29/2016 Document Reviewed: 06/10/2014 Elsevier Interactive Patient Education  2017 [employer].

## 2017-01-31 NOTE — Progress Notes (Signed)
Patient ID: Sue [name], female   DOB: [birth year], 66 y.o.   MRN: [id number]     Subjective:  I acted as a Education administrator for Dr. Carollee Herter.  Sue [name], Sue [name]   Patient ID: Sue [name], female    DOB: 01-Sep-1951, 66 y.o.   MRN: [id number]  Chief Complaint  Patient presents with  . Headache    started last week and this weeks is worse  . Generalized Body Aches  . Shortness of Breath  . knees buckling    joints have been hurting,  2 weeks ago knees buckled    HPI  Patient is in today for headache, bodyaches,  SOB,t knees buckling, and wants a refill on alprazolam.  She has had flu-like symptoms that started last week and has gotten worse this week. She states that her knees buckled about 2 weeks ago.  She did not fall.  She also needs a refill on Xanax.    Patient Care Team: Carollee Herter, Alferd Apa, DO as PCP - General (Family Medicine) Lelon Perla, MD as Referring Physician (Internal Medicine) Lelon Perla, MD as Referring Physician (Internal Medicine) Carollee Herter, Alferd Apa, DO (Family Medicine)   Past Medical History:  Diagnosis Date  . Depression   . Emphysema/COPD ([place])   . GERD (gastroesophageal reflux disease)   . Hypertension   . Stroke [facility]) 2016   TIA   . Urine incontinence     Past Surgical History:  Procedure Laterality Date  . ABDOMINAL HYSTERECTOMY    . CESAREAN SECTION      Family History  Problem Relation Age of Onset  . Heart disease Father        MVP and Pics Valve  . Hypertension Father   . Depression Father        Institutionalized x's 2 years  . Bipolar disorder Father   . Heart disease Paternal Grandmother   . Heart disease Paternal Aunt   . Heart disease Paternal Uncle   . Schizophrenia Paternal Aunt   . Hypertension Sister   . Diabetes Sister   . Hyperlipidemia Sister   . Heart disease Sister 19       MI  . Heart disease Brother   . Hypertension Brother     Social History   Social History  . Marital status:  Single    Spouse name: N/A  . Number of children: N/A  . Years of education: N/A   Occupational History  . disabled- Occupational hygienist Med    Social History Main Topics  . Smoking status: Former Smoker    Packs/day: 1.00    Years: 40.00    Types: Cigarettes    Quit date: 10/16/2011  . Smokeless tobacco: Never Used  . Alcohol use Yes     Comment: Occ-- Wine  . Drug use: No  . Sexual activity: Not on file   Other Topics Concern  . Not on file   Social History Narrative  . No narrative on file    Outpatient Medications Prior to Visit  Medication Sig Dispense Refill  . albuterol (PROVENTIL HFA;VENTOLIN HFA) 108 (90 Base) MCG/ACT inhaler Inhale 2 puffs into the lungs every 6 (six) hours as needed for wheezing or shortness of breath. 1 Inhaler 0  . aspirin EC 325 MG EC tablet Take 1 tablet (325 mg total) by mouth daily. 60 tablet 0  . doxylamine, Sleep, (UNISOM) 25 MG tablet Take 100 mg by mouth at bedtime as needed for sleep.    Marland Kitchen  Elbasvir-Grazoprevir (ZEPATIER) 50-100 MG TABS Take 1 tablet by mouth daily. 28 tablet 0  . furosemide (LASIX) 20 MG tablet 3 po qd (Patient taking differently: Take 60 mg by mouth daily. 3 po qd) 270 tablet 1  . levothyroxine (SYNTHROID, LEVOTHROID) 50 MCG tablet Take 1 tablet (50 mcg total) by mouth daily. 30 tablet 11  . losartan (COZAAR) 100 MG tablet TAKE 1 TABLET BY MOUTH ONCE DAILY 90 tablet 0  . metoprolol succinate (TOPROL-XL) 50 MG 24 hr tablet TAKE TWO TABLETS BY MOUTH IN THE MORNING TAKE WITH OR IMMEDIATELY FOLLOWING A MEAL 60 tablet 5  . nystatin (MYCOSTATIN) 100000 UNIT/ML suspension 5 ml swish and spit qid 60 mL 0  . OXYGEN Inhale 3 L into the lungs continuous.     . Potassium Chloride ER 20 MEQ TBCR TAKE ONE TABLET BY MOUTH TWICE DAILY 30 tablet 1  . QUEtiapine (SEROQUEL) 50 MG tablet Take 2 tablets by mouth at bedtime.  3  . Vitamin D, Ergocalciferol, (DRISDOL) 50000 units CAPS capsule Take 1 capsule (50,000 Units total) by mouth every 7 (seven)  days. 4 capsule 2  . zolpidem (AMBIEN) 5 MG tablet Take 1 tablet (5 mg total) by mouth at bedtime. 30 tablet 0  . ALPRAZolam (XANAX) 0.5 MG tablet TAKE 1 TABLET BY MOUTH TWICE A DAY AS NEEDED FOR ANXIETY 60 tablet 0  . benzonatate (TESSALON) 100 MG capsule Take 1 capsule (100 mg total) by mouth 3 (three) times daily as needed. 30 capsule 0  . metoprolol succinate (TOPROL-XL) 50 MG 24 hr tablet TAKE TWO TABLETS BY MOUTH IN THE MORNING TAKE  WITH  OR IMMEDIATELY FOLLOWING A MEAL 60 tablet 5  . ondansetron (ZOFRAN ODT) 4 MG disintegrating tablet Take 1 tablet (4 mg total) by mouth every 8 (eight) hours as needed for nausea or vomiting. 20 tablet 0  . promethazine-dextromethorphan (PROMETHAZINE-DM) 6.25-15 MG/5ML syrup Take 5-10 mLs by mouth 4 (four) times daily as needed for cough. 118 mL 0   No facility-administered medications prior to visit.     No Known Allergies  Review of Systems  Constitutional: Negative for fever and malaise/fatigue.       Body aches   HENT: Negative for congestion.   Eyes: Negative for blurred vision.  Respiratory: Positive for shortness of breath. Negative for cough.   Cardiovascular: Negative for chest pain, palpitations and leg swelling.  Gastrointestinal: Negative for vomiting.  Musculoskeletal: Positive for joint pain and myalgias. Negative for back pain.  Skin: Negative for rash.  Neurological: Positive for headaches. Negative for loss of consciousness.  Psychiatric/Behavioral: Positive for depression.       Objective:    Physical Exam  Constitutional: She is oriented to person, place, and time. She appears well-developed and well-nourished.  HENT:  Head: Normocephalic and atraumatic.  Eyes: Conjunctivae and EOM are normal.  Neck: Normal range of motion. Neck supple. No JVD present. Carotid bruit is not present. No thyromegaly present.  Cardiovascular: Normal rate, regular rhythm and normal heart sounds.   No murmur heard. Pulmonary/Chest: Effort  normal and breath sounds normal. No respiratory distress. She has no wheezes. She has no rales. She exhibits no tenderness.  Musculoskeletal: She exhibits tenderness. She exhibits no edema.  Pt c/o muscle aches and joint aches all over   Neurological: She is alert and oriented to person, place, and time.  + headaches in base of scalp and shoulders + muscle spasms in trap on R   Psychiatric: She has a normal  mood and affect.  Nursing note and vitals reviewed.   BP 110/80 (BP Location: Right Arm, Cuff Size: Large)   Pulse 79   Temp 98.1 F (36.7 C) (Oral)   Resp 16   Ht 5\' 3"  (1.6 m)   Wt 294 lb 3.2 oz (133.4 kg)   SpO2 93%   BMI 52.12 kg/m  Wt Readings from Last 3 Encounters:  01/31/17 294 lb 3.2 oz (133.4 kg)  11/02/16 293 lb (132.9 kg)  08/23/16 293 lb 9.6 oz (133.2 kg)   BP Readings from Last 3 Encounters:  01/31/17 110/80  08/23/16 116/70  08/01/16 (!) 164/95     Immunization History  Administered Date(s) Administered  . Influenza,inj,Quad PF,36+ Mos 08/21/2014, 10/27/2015, 08/23/2016  . Influenza-Unspecified 06/12/2013  . Pneumococcal Polysaccharide-23 09/12/2012  . Tdap 09/12/2009    Health Maintenance  Topic Date Due  . COLONOSCOPY  07/21/2002  . MAMMOGRAM  01/01/2015  . INFLUENZA VACCINE  04/12/2017  . TETANUS/TDAP  09/13/2019  . Hepatitis C Screening  Completed  . HIV Screening  Completed    Lab Results  Component Value Date   WBC 4.9 01/15/2016   HGB 12.6 01/15/2016   HCT 36.9 01/15/2016   PLT 181.0 01/15/2016   GLUCOSE 81 08/01/2016   CHOL 98 01/15/2016   TRIG 66.0 01/15/2016   HDL 22.60 (L) 01/15/2016   LDLCALC 62 01/15/2016   ALT 20 01/15/2016   AST 49 (H) 01/15/2016   NA 138 08/01/2016   K 3.7 08/01/2016   CL 101 08/01/2016   CREATININE 1.08 (H) 08/01/2016   BUN 11 08/01/2016   CO2 32 (H) 08/01/2016   TSH 3.77 07/19/2016   INR 1.21 12/15/2015   HGBA1C 5.8 07/19/2016    Lab Results  Component Value Date   TSH 3.77 07/19/2016    Lab Results  Component Value Date   WBC 4.9 01/15/2016   HGB 12.6 01/15/2016   HCT 36.9 01/15/2016   MCV 96.6 01/15/2016   PLT 181.0 01/15/2016   Lab Results  Component Value Date   NA 138 08/01/2016   K 3.7 08/01/2016   CO2 32 (H) 08/01/2016   GLUCOSE 81 08/01/2016   BUN 11 08/01/2016   CREATININE 1.08 (H) 08/01/2016   BILITOT 0.6 01/15/2016   ALKPHOS 105 01/15/2016   AST 49 (H) 01/15/2016   ALT 20 01/15/2016   PROT 8.4 (H) 01/15/2016   ALBUMIN 3.0 (L) 01/15/2016   CALCIUM 9.5 08/01/2016   ANIONGAP 10 12/15/2015   GFR 32.64 (L) 07/19/2016   Lab Results  Component Value Date   CHOL 98 01/15/2016   Lab Results  Component Value Date   HDL 22.60 (L) 01/15/2016   Lab Results  Component Value Date   LDLCALC 62 01/15/2016   Lab Results  Component Value Date   TRIG 66.0 01/15/2016   Lab Results  Component Value Date   CHOLHDL 4 01/15/2016   Lab Results  Component Value Date   HGBA1C 5.8 07/19/2016         Assessment & Plan:   Problem List Items Addressed This Visit      Unprioritized   Hypothyroidism   Relevant Orders   TSH   Arthralgia    Check labs Consider rheum      Relevant Orders   Vitamin D 1,25 dihydroxy   CBC with Differential/Platelet   Rheumatoid Factor   Sedimentation rate   Antinuclear Antib (ANA)   Comprehensive metabolic panel   Chronic respiratory failure ([place])  Per pulmnonary      Depression with anxiety    Per psych Reviewed controlled sub database-- pt was getting ativan from psych and xanax from [place] We will no longer write xanax Pt will get all her psych meds from psychiatrist       Myalgia - Primary    Check labs Consider rheum referral      Relevant Orders   Vitamin D 1,25 dihydroxy   CBC with Differential/Platelet   Rheumatoid Factor   Sedimentation rate   Antinuclear Antib (ANA)   Comprehensive metabolic panel    Other Visit Diagnoses    SOB (shortness of breath)       Relevant Orders   DG  Chest 2 View   Chronic obstructive pulmonary disease, unspecified COPD type ([place])       Relevant Medications   levocetirizine (XYZAL) 5 MG tablet   fluticasone (FLONASE) 50 MCG/ACT nasal spray   Other Relevant Orders   Ambulatory referral to Pulmonology   Seasonal allergic rhinitis due to pollen       Relevant Medications   levocetirizine (XYZAL) 5 MG tablet   fluticasone (FLONASE) 50 MCG/ACT nasal spray   Preventative health care       Relevant Orders   Ambulatory referral to Gastroenterology   Breast cancer screening       Relevant Orders   MM Digital Screening      I have discontinued Ms. Gaines's benzonatate, promethazine-dextromethorphan, ondansetron, and ALPRAZolam. I am also having her start on levocetirizine and fluticasone. Additionally, I am having her maintain her OXYGEN, aspirin, nystatin, furosemide, QUEtiapine, doxylamine (Sleep), Vitamin D (Ergocalciferol), albuterol, levothyroxine, zolpidem, Potassium Chloride ER, Elbasvir-Grazoprevir, metoprolol succinate, losartan, and LORazepam.  Meds ordered this encounter  Medications  . LORazepam (ATIVAN) 2 MG tablet    Sig: Take 1 tablet (2 mg total) by mouth every 6 (six) hours as needed for anxiety.    Dispense:  30 tablet    Refill:  0  . levocetirizine (XYZAL) 5 MG tablet    Sig: Take 1 tablet (5 mg total) by mouth every evening.    Dispense:  30 tablet    Refill:  5  . fluticasone (FLONASE) 50 MCG/ACT nasal spray    Sig: Place 2 sprays into both nostrils daily.    Dispense:  16 g    Refill:  6    CMA served as scribe during this visit. History, Physical and Plan performed by medical provider. Documentation and orders reviewed and attested to.  Ann Held, DO

## 2017-02-01 ENCOUNTER — Encounter: Payer: Self-pay | Admitting: Family Medicine

## 2017-02-01 ENCOUNTER — Telehealth: Payer: Self-pay | Admitting: *Deleted

## 2017-02-01 DIAGNOSIS — M2550 Pain in unspecified joint: Secondary | ICD-10-CM | POA: Insufficient documentation

## 2017-02-01 DIAGNOSIS — M791 Myalgia, unspecified site: Secondary | ICD-10-CM | POA: Insufficient documentation

## 2017-02-01 NOTE — Assessment & Plan Note (Signed)
Check labs Consider rheum referral

## 2017-02-01 NOTE — Assessment & Plan Note (Signed)
Per [employer]

## 2017-02-01 NOTE — Telephone Encounter (Signed)
LM for patient to return call to schedule AWV.   

## 2017-02-01 NOTE — Assessment & Plan Note (Signed)
Check labs Consider rheum

## 2017-02-01 NOTE — Assessment & Plan Note (Signed)
Per psych Reviewed controlled sub database-- pt was getting ativan from psych and xanax from [place] We will no longer write xanax Pt will get all her psych meds from psychiatrist

## 2017-02-02 DIAGNOSIS — F319 Bipolar disorder, unspecified: Secondary | ICD-10-CM | POA: Diagnosis not present

## 2017-02-03 ENCOUNTER — Other Ambulatory Visit (HOSPITAL_BASED_OUTPATIENT_CLINIC_OR_DEPARTMENT_OTHER): Payer: Medicare HMO

## 2017-02-03 ENCOUNTER — Other Ambulatory Visit: Payer: Medicare HMO

## 2017-02-13 ENCOUNTER — Telehealth: Payer: Self-pay | Admitting: Family Medicine

## 2017-02-13 ENCOUNTER — Other Ambulatory Visit: Payer: Self-pay | Admitting: Family Medicine

## 2017-02-13 DIAGNOSIS — M25562 Pain in left knee: Secondary | ICD-10-CM

## 2017-02-13 NOTE — Telephone Encounter (Signed)
Patient informed and referral entered for left knee pain to [facility]

## 2017-02-13 NOTE — Telephone Encounter (Signed)
After hours is walk in , I thought [place] to put referral in

## 2017-02-13 NOTE — Telephone Encounter (Signed)
Relation to DC:VUDT Call back number:[phone]   Reason for call:  Patient requesting a referral to [facility] [address], [place], [place] [address] due to knee pain, patient states specialist has after hours today, please advise

## 2017-02-14 ENCOUNTER — Encounter (INDEPENDENT_AMBULATORY_CARE_PROVIDER_SITE_OTHER): Payer: Self-pay | Admitting: Orthopaedic Surgery

## 2017-02-14 ENCOUNTER — Ambulatory Visit (INDEPENDENT_AMBULATORY_CARE_PROVIDER_SITE_OTHER): Payer: Medicare HMO

## 2017-02-14 ENCOUNTER — Ambulatory Visit (INDEPENDENT_AMBULATORY_CARE_PROVIDER_SITE_OTHER): Payer: Medicare HMO | Admitting: Orthopaedic Surgery

## 2017-02-14 ENCOUNTER — Ambulatory Visit (INDEPENDENT_AMBULATORY_CARE_PROVIDER_SITE_OTHER): Payer: Medicare HMO | Admitting: Pharmacist

## 2017-02-14 VITALS — BP 141/78 | HR 79 | Resp 16 | Ht 63.0 in | Wt 294.0 lb

## 2017-02-14 DIAGNOSIS — B182 Chronic viral hepatitis C: Secondary | ICD-10-CM | POA: Diagnosis not present

## 2017-02-14 DIAGNOSIS — M25561 Pain in right knee: Secondary | ICD-10-CM

## 2017-02-14 DIAGNOSIS — G8929 Other chronic pain: Secondary | ICD-10-CM | POA: Diagnosis not present

## 2017-02-14 DIAGNOSIS — M25562 Pain in left knee: Secondary | ICD-10-CM

## 2017-02-14 LAB — CBC
HCT: 37.1 % (ref 35.0–45.0)
Hemoglobin: 11.9 g/dL (ref 11.7–15.5)
MCH: 31 pg (ref 27.0–33.0)
MCHC: 32.1 g/dL (ref 32.0–36.0)
MCV: 96.6 fL (ref 80.0–100.0)
MPV: 9.7 fL (ref 7.5–12.5)
PLATELETS: 187 10*3/uL (ref 140–400)
RBC: 3.84 MIL/uL (ref 3.80–5.10)
RDW: 13.1 % (ref 11.0–15.0)
WBC: 6.1 10*3/uL (ref 3.8–10.8)

## 2017-02-14 MED ORDER — BUPIVACAINE HCL 0.5 % IJ SOLN
3.0000 mL | INTRAMUSCULAR | Status: AC | PRN
  Administered 2017-02-14: 3 mL via INTRA_ARTICULAR

## 2017-02-14 MED ORDER — LIDOCAINE HCL 1 % IJ SOLN
5.0000 mL | INTRAMUSCULAR | Status: AC | PRN
  Administered 2017-02-14: 5 mL

## 2017-02-14 MED ORDER — METHYLPREDNISOLONE ACETATE 40 MG/ML IJ SUSP
80.0000 mg | INTRAMUSCULAR | Status: AC | PRN
  Administered 2017-02-14: 80 mg

## 2017-02-14 NOTE — Patient Instructions (Signed)
We will check labs today and you will follow up in 6 months for your test of cure visit.

## 2017-02-14 NOTE — Progress Notes (Signed)
HPI: Sue [name] is a 66 y.o. female who presents today in a wheelchair for her hepatitis C follow up.  No results found for: HCVGENOTYPE, HEPCGENOTYPE  Allergies: No Known Allergies  Vitals:    Past Medical History: Past Medical History:  Diagnosis Date  . Depression   . Emphysema/COPD ([place])   . GERD (gastroesophageal reflux disease)   . Hypertension   . Stroke [facility]) 2016   TIA   . Urine incontinence     Social History: Social History   Social History  . Marital status: Single    Spouse name: N/A  . Number of children: N/A  . Years of education: N/A   Occupational History  . disabled- Occupational hygienist Med    Social History Main Topics  . Smoking status: Former Smoker    Packs/day: 1.00    Years: 40.00    Types: Cigarettes    Quit date: 10/16/2011  . Smokeless tobacco: Never Used  . Alcohol use Yes     Comment: Occ-- Wine  . Drug use: No  . Sexual activity: Not on file   Other Topics Concern  . Not on file   Social History Narrative  . No narrative on file    Labs: Hep B S Ab (no units)  Date Value  08/01/2016 INDETER (A)   Hepatitis B Surface Ag (no units)  Date Value  08/01/2016 NEGATIVE   HCV Ab (no units)  Date Value  07/19/2016 REACTIVE (A)    No results found for: HCVGENOTYPE, HEPCGENOTYPE  Hepatitis C RNA quantitative Latest Ref Rng & Units 12/13/2016 07/19/2016 01/15/2016  HCV Quantitative NOT DETECTED IU/mL <15 NOT DETECTED 5,329,924(Q) CANCELED  HCV Quantitative Log NOT DETECTED Log IU/mL <1.18 NOT DETECTED 6.69(H) CANCELED    AST (U/L)  Date Value  01/15/2016 49 (H)  12/15/2015 67 (H)  10/27/2015 45 (H)   ALT (U/L)  Date Value  01/15/2016 20  12/15/2015 26  10/27/2015 22   INR (no units)  Date Value  12/15/2015 1.21  07/30/2014 1.12  07/29/2014 1.13    CrCl: CrCl cannot be calculated (Patient's most recent lab result is older than the maximum 21 days allowed.).  Fibrosis Score: F3/F4 as assessed by  elastography  Child-Pugh Score: A  Previous Treatment Regimen: None  Assessment: Sue [name] is a 66 year old female who presents today for her Zepatier completion visit treating her hepatitis C. She reports that she has missed one dose of the medication and today is her last pill.   Lyra has had some headaches while on this medication for about 2-3 weeks, these have since resolved. She was initially having some nausea and was given a prescription for ondansetron. She states she takes the medication with food and has not had to use any of the ondansetron.  Discussed with patient that if there was any additional labs or monitoring to be ordered per Dr. Linus Salmons we will let her know.   Recommendations: - HCV viral load and CBC today - Follow up appointment made for 6 months (12/12), will get cure labs at this time  Dimitri Ped, PharmD, [place] PGY-2 Infectious Diseases Pharmacy Resident Pager: [phone] 02/14/2017, 10:43 AM

## 2017-02-14 NOTE — Progress Notes (Signed)
Office Visit Note   Patient: Sue [name]           Date of Birth: 09/28/1950           MRN: [id number] Visit Date: 02/14/2017              Requested by: [address] [place], [place] [address] [place], [place] [address] PCP: Carollee Herter, Alferd Apa, DO   Assessment & Plan: Visit Diagnoses:  1. Chronic pain of right knee   2. Chronic pain of left knee   Mild osteoarthritis both knees. Orbital obesity  Plan: Long discussion regarding x-ray findings and suspected diagnosis of osteoarthritis. We'll inject cortisone left knee today and see her back in 3 weeks to consider injecting the right knee. Also make referral to the bariatric clinic  Follow-Up Instructions: No Follow-up on file.   Orders:  Orders Placed This Encounter  Procedures  . XR KNEE 3 VIEW RIGHT  . XR KNEE 3 VIEW LEFT  . Amb Referral to Bariatric Surgery   No orders of the defined types were placed in this encounter.     Procedures: Large Joint Inj Date/Time: 02/14/2017 1:04 PM Performed by: Garald Balding Authorized by: Garald Balding   Consent Given by:  Patient Timeout: prior to procedure the correct patient, procedure, and site was verified   Indications:  Pain and joint swelling Location:  Knee Site:  L knee Prep: patient was prepped and draped in usual sterile fashion   Needle Size:  25 G Needle Length:  1.5 inches Approach:  Anteromedial Ultrasound Guidance: No   Fluoroscopic Guidance: No   Arthrogram: No   Medications:  5 mL lidocaine 1 %; 80 mg methylPREDNISolone acetate 40 MG/ML; 3 mL bupivacaine 0.5 % Aspiration Attempted: No   Patient tolerance:  Patient tolerated the procedure well with no immediate complications     Clinical Data: No additional findings.   Subjective: Chief Complaint  Patient presents with  . Right Knee - Pain, Edema  . Left Knee - Pain, Edema   Sue [name] has a long history with morbid obesity, COPD and emphysema. She stopped smoking  several years ago and is presently on chronic nasal oxygen. She's been told that if she loses weight that she potentially could discontinue the oxygen. She is "working out". She believes she weighs about 295 pounds. BMI is 52 she presently is complaining of bilateral knee pain without injury or trauma. She's had some achiness stiffness sometimes a sensation of her knee giving way. She denies back pain. She denies any groin discomfort. There is no referred pain proximally or distally. HPI  Review of Systems   Objective: Vital Signs: BP (!) 141/78   Pulse 79   Resp 16   Ht 5\' 3"  (1.6 m)   Wt 294 lb (133.4 kg)   BMI 52.08 kg/m   Physical Exam  Ortho Exam large legs. Mild edema both ankles and feet that is nonpitting. +1 pulses. Normal capillary refill. Extension both knees without obvious effusion. Very minimal patellar crepitation bilaterally. More medial than lateral joint pain bilaterally. Flexed about 95 when calf touches thigh. No popliteal pain. No calf pain. Skin intact.  Specialty Comments:  No specialty comments available.  Imaging: Xr Knee 3 View Left  Result Date: 02/14/2017 3 views of the left knee are obtained in the standing projection. There is very slight arthritic changes in all 3 compartments of the joint spaces are well maintained. No  ectopic calcification. Patella tracks in the midline. Findings consistent with mild osteoarthritis  Xr Knee 3 View Right  Result Date: 02/14/2017 Films of the right knee were obtained 3 projections standing. Arthritic changes are less than those noted on the left knee. Joint spaces are well maintained. Malalignment or increased varus or valgus. Patella tracks in the midline. No ectopic calcification. Findings consistent with mild osteoarthritis    PMFS History: Patient Active Problem List   Diagnosis Date Noted  . Myalgia 02/01/2017  . Arthralgia 02/01/2017  . Chronic pain of toe of left foot 11/14/2016  . Chronic hepatitis C  without hepatic coma ([place]) 08/01/2016  . Hypothyroidism 10/29/2015  . Mild diastolic dysfunction 73/22/0254  . Edema 01/01/2015  . Pedal edema 12/24/2014  . TIA (transient ischemic attack) 07/30/2014  . Weakness 07/29/2014  . Acute sinusitis 01/29/2014  . Flank pain 11/12/2013  . UTI symptoms 11/12/2013  . Chronic respiratory failure ([place]) 10/25/2013  . Dyspnea 10/25/2013  . Obesity (BMI 30-39.9) 10/15/2013  . Depression with anxiety 10/15/2013  . Insomnia 10/15/2013  . Acute respiratory failure with hypoxia ([place]) 10/09/2013  . Acute renal failure ([place]) 10/09/2013  . HTN (hypertension) 10/09/2013  . Hypokalemia 10/09/2013   Past Medical History:  Diagnosis Date  . Depression   . Emphysema/COPD ([place])   . GERD (gastroesophageal reflux disease)   . Hypertension   . Stroke [facility]) 2016   TIA   . Urine incontinence     Family History  Problem Relation Age of Onset  . Heart disease Father        MVP and Pics Valve  . Hypertension Father   . Depression Father        Institutionalized x's 2 years  . Bipolar disorder Father   . Heart disease Paternal Grandmother   . Heart disease Paternal Aunt   . Heart disease Paternal Uncle   . Schizophrenia Paternal Aunt   . Hypertension Sister   . Diabetes Sister   . Hyperlipidemia Sister   . Heart disease Sister 60       MI  . Heart disease Brother   . Hypertension Brother     Past Surgical History:  Procedure Laterality Date  . ABDOMINAL HYSTERECTOMY    . CESAREAN SECTION     Social History   Occupational History  . disabled- Occupational hygienist Med    Social History Main Topics  . Smoking status: Former Smoker    Packs/day: 1.00    Years: 40.00    Types: Cigarettes    Quit date: 10/16/2011  . Smokeless tobacco: Never Used  . Alcohol use Yes     Comment: Occ-- Wine  . Drug use: No  . Sexual activity: Not on file     Garald Balding, MD   Note - This record has been created using [employer].  Chart creation errors  have been sought, but may not always  have been located. Such creation errors do not reflect on  the standard of medical care.

## 2017-02-16 LAB — HEPATITIS C RNA QUANTITATIVE
HCV QUANT: NOT DETECTED [IU]/mL
HCV Quantitative Log: <1.18 Log IU/mL

## 2017-02-17 ENCOUNTER — Ambulatory Visit (INDEPENDENT_AMBULATORY_CARE_PROVIDER_SITE_OTHER): Payer: Medicare HMO | Admitting: Orthopaedic Surgery

## 2017-02-25 ENCOUNTER — Other Ambulatory Visit: Payer: Self-pay | Admitting: Family Medicine

## 2017-02-25 DIAGNOSIS — R600 Localized edema: Secondary | ICD-10-CM

## 2017-02-27 ENCOUNTER — Ambulatory Visit (INDEPENDENT_AMBULATORY_CARE_PROVIDER_SITE_OTHER): Payer: Medicare HMO | Admitting: Family Medicine

## 2017-02-27 ENCOUNTER — Encounter: Payer: Self-pay | Admitting: Family Medicine

## 2017-02-27 VITALS — BP 100/76 | HR 81 | Temp 98.2°F | Resp 16 | Ht 63.0 in | Wt 297.0 lb

## 2017-02-27 DIAGNOSIS — G458 Other transient cerebral ischemic attacks and related syndromes: Secondary | ICD-10-CM | POA: Diagnosis not present

## 2017-02-27 DIAGNOSIS — J9610 Chronic respiratory failure, unspecified whether with hypoxia or hypercapnia: Secondary | ICD-10-CM | POA: Diagnosis not present

## 2017-02-27 DIAGNOSIS — I519 Heart disease, unspecified: Secondary | ICD-10-CM | POA: Diagnosis not present

## 2017-02-27 DIAGNOSIS — I10 Essential (primary) hypertension: Secondary | ICD-10-CM | POA: Diagnosis not present

## 2017-02-27 DIAGNOSIS — E039 Hypothyroidism, unspecified: Secondary | ICD-10-CM

## 2017-02-27 DIAGNOSIS — Z8673 Personal history of transient ischemic attack (TIA), and cerebral infarction without residual deficits: Secondary | ICD-10-CM

## 2017-02-27 DIAGNOSIS — I5189 Other ill-defined heart diseases: Secondary | ICD-10-CM

## 2017-02-27 NOTE — Assessment & Plan Note (Signed)
Well controlled, no changes to meds. Encouraged heart healthy diet such as the DASH diet and exercise as tolerated.  °

## 2017-02-27 NOTE — Progress Notes (Deleted)
Patient ID: Sue [name], female   DOB: 07/08/1951, 66 y.o.   MRN: [id number]     Subjective:  I acted as a Education administrator for Dr. Carollee Herter.  Sue [name], Sue [name]   Patient ID: Sue [name], female    DOB: 11/08/1950, 66 y.o.   MRN: [id number]  Chief Complaint  Patient presents with  . Home health    HPI  Patient is in today to get home health paperwork filled out.  Patient Care Team: Carollee Herter, Alferd Apa, DO as PCP - General (Family Medicine) Lelon Perla, MD as Referring Physician (Internal Medicine) Lelon Perla, MD as Referring Physician (Internal Medicine) Carollee Herter, Alferd Apa, DO (Family Medicine)   Past Medical History:  Diagnosis Date  . Depression   . Emphysema/COPD ([place])   . GERD (gastroesophageal reflux disease)   . Hypertension   . Stroke [facility]) 2016   TIA   . Urine incontinence     Past Surgical History:  Procedure Laterality Date  . ABDOMINAL HYSTERECTOMY    . CESAREAN SECTION      Family History  Problem Relation Age of Onset  . Heart disease Father        MVP and Pics Valve  . Hypertension Father   . Depression Father        Institutionalized x's 2 years  . Bipolar disorder Father   . Heart disease Paternal Grandmother   . Heart disease Paternal Aunt   . Heart disease Paternal Uncle   . Schizophrenia Paternal Aunt   . Hypertension Sister   . Diabetes Sister   . Hyperlipidemia Sister   . Heart disease Sister 56       MI  . Heart disease Brother   . Hypertension Brother     Social History   Social History  . Marital status: Single    Spouse name: N/A  . Number of children: N/A  . Years of education: N/A   Occupational History  . disabled- Occupational hygienist Med    Social History Main Topics  . Smoking status: Former Smoker    Packs/day: 1.00    Years: 40.00    Types: Cigarettes    Quit date: 10/16/2011  . Smokeless tobacco: Never Used  . Alcohol use Yes     Comment: Occ-- Wine  . Drug use: No  . Sexual activity: Not on  file   Other Topics Concern  . Not on file   Social History Narrative  . No narrative on file    Outpatient Medications Prior to Visit  Medication Sig Dispense Refill  . albuterol (PROVENTIL HFA;VENTOLIN HFA) 108 (90 Base) MCG/ACT inhaler Inhale 2 puffs into the lungs every 6 (six) hours as needed for wheezing or shortness of breath. 1 Inhaler 0  . ALPRAZolam (XANAX) 0.5 MG tablet TAKE 1 TABLET BY MOUTH TWICE A DAY AS NEEDED FOR ANXIETY  0  . aspirin EC 325 MG EC tablet Take 1 tablet (325 mg total) by mouth daily. 60 tablet 0  . doxylamine, Sleep, (UNISOM) 25 MG tablet Take 100 mg by mouth at bedtime as needed for sleep.    . fluticasone (FLONASE) 50 MCG/ACT nasal spray Place 2 sprays into both nostrils daily. 16 g 6  . furosemide (LASIX) 20 MG tablet TAKE THREE TABLETS BY MOUTH ONCE DAILY 270 tablet 1  . levocetirizine (XYZAL) 5 MG tablet Take 1 tablet (5 mg total) by mouth every evening. 30 tablet 5  . levothyroxine (SYNTHROID, LEVOTHROID) 50 MCG  tablet Take 1 tablet (50 mcg total) by mouth daily. 30 tablet 11  . LORazepam (ATIVAN) 2 MG tablet Take 1 tablet (2 mg total) by mouth every 6 (six) hours as needed for anxiety. 30 tablet 0  . losartan (COZAAR) 100 MG tablet TAKE 1 TABLET BY MOUTH ONCE DAILY 90 tablet 0  . metoprolol succinate (TOPROL-XL) 50 MG 24 hr tablet TAKE TWO TABLETS BY MOUTH IN THE MORNING TAKE WITH OR IMMEDIATELY FOLLOWING A MEAL 60 tablet 5  . nystatin (MYCOSTATIN) 100000 UNIT/ML suspension 5 ml swish and spit qid 60 mL 0  . OXYGEN Inhale 3 L into the lungs continuous.     Marland Kitchen QUEtiapine (SEROQUEL) 50 MG tablet Take 2 tablets by mouth at bedtime.  3  . Vitamin D, Ergocalciferol, (DRISDOL) 50000 units CAPS capsule Take 1 capsule (50,000 Units total) by mouth every 7 (seven) days. 4 capsule 2  . zolpidem (AMBIEN) 5 MG tablet Take 1 tablet (5 mg total) by mouth at bedtime. 30 tablet 0  . Elbasvir-Grazoprevir (ZEPATIER) 50-100 MG TABS Take 1 tablet by mouth daily. 28 tablet  0  . Potassium Chloride ER 20 MEQ TBCR TAKE ONE TABLET BY MOUTH TWICE DAILY 30 tablet 1   No facility-administered medications prior to visit.     No Known Allergies  ROS     Objective:    Physical Exam  BP 100/76 (BP Location: Left Arm, Cuff Size: Large)   Pulse 81   Temp 98.2 F (36.8 C) (Oral)   Resp 16   Ht 5\' 3"  (1.6 m)   Wt 297 lb (134.7 kg)   SpO2 93% Comment: 2 liters of O2  BMI 52.61 kg/m  Wt Readings from Last 3 Encounters:  02/27/17 297 lb (134.7 kg)  02/14/17 294 lb (133.4 kg)  01/31/17 294 lb 3.2 oz (133.4 kg)   BP Readings from Last 3 Encounters:  02/27/17 100/76  02/14/17 (!) 141/78  01/31/17 110/80     Immunization History  Administered Date(s) Administered  . Influenza,inj,Quad PF,36+ Mos 08/21/2014, 10/27/2015, 08/23/2016  . Influenza-Unspecified 06/12/2013  . Pneumococcal Polysaccharide-23 09/12/2012  . Tdap 09/12/2009    Health Maintenance  Topic Date Due  . COLONOSCOPY  07/21/2002  . MAMMOGRAM  01/01/2015  . INFLUENZA VACCINE  04/12/2017  . TETANUS/TDAP  09/13/2019  . Hepatitis C Screening  Completed  . HIV Screening  Completed    Lab Results  Component Value Date   WBC 6.1 02/14/2017   HGB 11.9 02/14/2017   HCT 37.1 02/14/2017   PLT 187 02/14/2017   GLUCOSE 81 08/01/2016   CHOL 98 01/15/2016   TRIG 66.0 01/15/2016   HDL 22.60 (L) 01/15/2016   LDLCALC 62 01/15/2016   ALT 20 01/15/2016   AST 49 (H) 01/15/2016   NA 138 08/01/2016   K 3.7 08/01/2016   CL 101 08/01/2016   CREATININE 1.08 (H) 08/01/2016   BUN 11 08/01/2016   CO2 32 (H) 08/01/2016   TSH 3.77 07/19/2016   INR 1.21 12/15/2015   HGBA1C 5.8 07/19/2016    Lab Results  Component Value Date   TSH 3.77 07/19/2016   Lab Results  Component Value Date   WBC 6.1 02/14/2017   HGB 11.9 02/14/2017   HCT 37.1 02/14/2017   MCV 96.6 02/14/2017   PLT 187 02/14/2017   Lab Results  Component Value Date   NA 138 08/01/2016   K 3.7 08/01/2016   CO2 32 (H) 08/01/2016    GLUCOSE 81 08/01/2016  BUN 11 08/01/2016   CREATININE 1.08 (H) 08/01/2016   BILITOT 0.6 01/15/2016   ALKPHOS 105 01/15/2016   AST 49 (H) 01/15/2016   ALT 20 01/15/2016   PROT 8.4 (H) 01/15/2016   ALBUMIN 3.0 (L) 01/15/2016   CALCIUM 9.5 08/01/2016   ANIONGAP 10 12/15/2015   GFR 32.64 (L) 07/19/2016   Lab Results  Component Value Date   CHOL 98 01/15/2016   Lab Results  Component Value Date   HDL 22.60 (L) 01/15/2016   Lab Results  Component Value Date   LDLCALC 62 01/15/2016   Lab Results  Component Value Date   TRIG 66.0 01/15/2016   Lab Results  Component Value Date   CHOLHDL 4 01/15/2016   Lab Results  Component Value Date   HGBA1C 5.8 07/19/2016         Assessment & Plan:   Problem List Items Addressed This Visit    None      I have discontinued Ms. Weinreb's Potassium Chloride ER and Elbasvir-Grazoprevir. I am also having her maintain her OXYGEN, aspirin, nystatin, QUEtiapine, doxylamine (Sleep), Vitamin D (Ergocalciferol), albuterol, levothyroxine, zolpidem, metoprolol succinate, losartan, LORazepam, levocetirizine, fluticasone, ALPRAZolam, and furosemide.  No orders of the defined types were placed in this encounter.   {PROVIDER TO DELETE} Jerene Dilling, CMA

## 2017-02-27 NOTE — Patient Instructions (Signed)
Chronic Respiratory Failure °Respiratory failure is a condition in which the lungs do not work well and the breathing (respiratory) system fails. When respiratory failure occurs, it becomes difficult for the lungs to get enough oxygen or to eliminate carbon dioxide or to do both duties. If the lungs do not work properly, the heart, brain, and other body systems do not get enough oxygen. Respiratory failure is life-threatening if it is not treated. °Respiratory failure can be acute or chronic. Acute respiratory failure is sudden and severe and requires emergency medical treatment. Chronic respiratory failure happens over time, usually due to a medical condition that gets worse. °What are the causes? °This condition may be caused by any problem that affects the heart or lungs. Causes include: °· Chronic bronchitis and emphysema (COPD). °· Pulmonary fibrosis. °· Water in the lungs due to heart failure, lung injury, or infection (pulmonary edema). °· Asthma. °· Nerve or muscle diseases that make chest movements difficult, such as Lou Gehrig disease or Guillain-Barre syndrome. °· A collapsed lung (pneumothorax). °· Pulmonary hypertension. °· Chronic sleep apnea. °· Pneumonia. °· Obesity. °· A blood clot in a lung (pulmonary embolism). °· Trauma to the chest that makes breathing difficult. °What increases the risk? °You are more likely to develop this condition if: °· You are a smoker, or have a history of smoking. °· You have a weak immune system. °· You have a family history of breathing problems or lung disease. °· You have a long term lung disease such as COPD. °What are the signs or symptoms? °Symptoms of this condition include: °· Shortness of breath with or without activity. °· Difficulty breathing. °· Wheezing. °· A fast or irregular heartbeat (arrhythmia). °· Chest pain or tightness. °· A bluish color to the fingernail or toenail beds (cyanosis). °· Confusion. °· Drowsiness. °· Extreme fatigue, especially with  minimal activity. °How is this diagnosed? °This condition may be diagnosed based on: °· Your medical history. °· A physical exam. °· Other tests, such as: °¨ A chest X-ray. °¨ A CT scan of your lungs. °¨ Blood tests, such as an arterial blood gas test. This test is done to check if you have enough oxygen in your blood. °¨ An electrocardiogram. This test records the electrical activity of your heart. °¨ An echocardiogram. This test uses sound waves to produce an image of your heart. °· A check of your blood pressure, heart rate, breathing rate, and blood oxygen level. °How is this treated? °Treatment for this condition depends on the cause. Treatment can include the following: °· Getting oxygen through a nasal cannula. This is a tube that goes in your nose. °· Getting oxygen through a face mask. °· Receiving noninvasive positive pressure ventilation. This is a method of breathing support in which a machine blows air into your lungs through a mask. The machine allows you to breathe on your own. It helps the body take in oxygen and eliminate carbon dioxide. °· Using a ventilator. This is a breathing machine that delivers oxygen to the lungs through a breathing tube that is put into the trachea. This machine is used when you can no longer breathe well enough on your own. °· Medicines to help with breathing, such as: °¨ Medicines that open up and relax air passages, such as bronchodilators. These may be given through a device that turns liquid medicines into a mist you can breathe in (nebulizer). These medicines help with breathing. °¨ Diuretics. These medicines get rid of extra fluid out of   your lungs, which can help you breathe better. °¨ Steroid medicines. These decrease inflammation in the lungs. °¨ Antibiotic medicines. These may be given to treat a bacterial infection, such as pneumonia. °· Pulmonary rehabilitation. This is an exercise program that strengthens the muscles in your chest and helps you learn breathing  techniques in order to manage your condition. °Follow these instructions at home: °Medicines  °· Take over-the-counter and prescription medicines only as told by your health care provider. °· If you were prescribed an antibiotic medicine, take it as told by your health care provider. Do not stop taking the antibiotic even if you start to feel better. °General instructions  °· Use oxygen therapy and pulmonary rehabilitation if directed to by your health care provider. If you require home oxygen therapy, ask your health care provider whether you should purchase a pulse oximeter to measure your oxygen level at home. °· Work with your health care provider to create a plan to help you deal with your condition. Follow this plan. °· Do not use any products that contain nicotine or tobacco, such as cigarettes and e-cigarettes. If you need help quitting, ask your health care provider. °· Avoid exposure to irritants that make your breathing problems worse. These include smoke, chemicals, and fumes. °· Stay active, but balance activity with periods of rest. Exercise and physical activity will help you maintain your ability to do things you want to do. °· Stay up to date on all vaccines, especially yearly influenza and pneumonia vaccines. °· Avoid people who are sick as well as crowded places during the flu season. °· Keep all follow-up visits as told by your health care provider. This is important. °Contact a health care provider if: °· Your shortness of breath gets worse and you cannot do the things you used to do. °· You have increased mucus (sputum), wheezing, coughing, or loss of energy. °· You are on oxygen therapy and you are starting to need more. °· You need to use your medicines more often. °· You have a fever. °Get help right away if: °· Your shortness of breath becomes worse. °· You are unable to say more than a few words without having to catch your breath. °· You develop chest pain or  tightness. °Summary °· Respiratory failure is a condition in which the lungs do not work well and the breathing system fails. °· This condition can be very serious and is often life-threatening. °· This condition is diagnosed with tests and can be treated with medicines or oxygen. °· Contact a health care provider if your shortness of breath gets worse or if you need to use your oxygen or medicines more often than before. °This information is not intended to replace advice given to you by your health care provider. Make sure you discuss any questions you have with your health care provider. °Document Released: 08/29/2005 Document Revised: 09/09/2016 Document Reviewed: 09/09/2016 °Elsevier Interactive Patient Education © 2017 [employer] ° °

## 2017-02-27 NOTE — Assessment & Plan Note (Signed)
Check tsh con't synthroid 

## 2017-02-27 NOTE — Assessment & Plan Note (Signed)
Refer to home health con't pulm f/u

## 2017-02-27 NOTE — Progress Notes (Signed)
Patient ID: Sue [name], female    DOB: 1951/05/13  Age: 66 y.o. MRN: [id number]    Subjective:  Subjective  HPI ZEIDY TAYAG presents for face to face for home health evaluation.  Pt daughter is with her  Pt has fallen 5-6 x in the last year with no major injuries .  She is on O2 24 hr at 2 L.  Pt needs help with bathing, dressing.  She has meals brought in by daughter for hot meals .Marland Kitchen Pt is able to make a sandwich in the kitchen.  Pt is unable to get her mail or take her trash out.  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    Review of Systems  Constitutional: Positive for fatigue. Negative for activity change, appetite change and unexpected weight change.  Respiratory: Positive for shortness of breath. Negative for cough.   Cardiovascular: Negative for chest pain and palpitations.  Musculoskeletal: Positive for arthralgias and gait problem.  Psychiatric/Behavioral: Negative for behavioral problems and dysphoric mood. The patient is not nervous/anxious.     History Past Medical History:  Diagnosis Date  . Depression   . Emphysema/COPD ([place])   . GERD (gastroesophageal reflux disease)   . Hypertension   . Stroke [facility]) 2016   TIA   . Urine incontinence     She has a past surgical history that includes Abdominal hysterectomy and Cesarean section.   Her family history includes Bipolar disorder in her father; Depression in her father; Diabetes in her sister; Heart disease in her brother, father, paternal aunt, paternal grandmother, and paternal uncle; Heart disease (age of onset: 40) in her sister; Hyperlipidemia in her sister; Hypertension in her brother, father, and sister; Schizophrenia in her paternal aunt.She reports that she quit smoking about 5 years ago. Her smoking use included Cigarettes. She has a 40.00 pack-year smoking history. She has never used smokeless tobacco. She reports that she drinks alcohol. She reports that she does not use drugs.  Current  Outpatient Prescriptions on File Prior to Visit  Medication Sig Dispense Refill  . albuterol (PROVENTIL HFA;VENTOLIN HFA) 108 (90 Base) MCG/ACT inhaler Inhale 2 puffs into the lungs every 6 (six) hours as needed for wheezing or shortness of breath. 1 Inhaler 0  . ALPRAZolam (XANAX) 0.5 MG tablet TAKE 1 TABLET BY MOUTH TWICE A DAY AS NEEDED FOR ANXIETY  0  . aspirin EC 325 MG EC tablet Take 1 tablet (325 mg total) by mouth daily. 60 tablet 0  . doxylamine, Sleep, (UNISOM) 25 MG tablet Take 100 mg by mouth at bedtime as needed for sleep.    . fluticasone (FLONASE) 50 MCG/ACT nasal spray Place 2 sprays into both nostrils daily. 16 g 6  . furosemide (LASIX) 20 MG tablet TAKE THREE TABLETS BY MOUTH ONCE DAILY 270 tablet 1  . levocetirizine (XYZAL) 5 MG tablet Take 1 tablet (5 mg total) by mouth every evening. 30 tablet 5  . levothyroxine (SYNTHROID, LEVOTHROID) 50 MCG tablet Take 1 tablet (50 mcg total) by mouth daily. 30 tablet 11  . LORazepam (ATIVAN) 2 MG tablet Take 1 tablet (2 mg total) by mouth every 6 (six) hours as needed for anxiety. 30 tablet 0  . losartan (COZAAR) 100 MG tablet TAKE 1 TABLET BY MOUTH ONCE DAILY 90 tablet 0  . metoprolol succinate (TOPROL-XL) 50 MG 24 hr tablet TAKE TWO TABLETS BY MOUTH IN THE MORNING TAKE WITH OR IMMEDIATELY FOLLOWING A MEAL 60 tablet 5  .  nystatin (MYCOSTATIN) 100000 UNIT/ML suspension 5 ml swish and spit qid 60 mL 0  . OXYGEN Inhale 3 L into the lungs continuous.     Marland Kitchen QUEtiapine (SEROQUEL) 50 MG tablet Take 2 tablets by mouth at bedtime.  3  . Vitamin D, Ergocalciferol, (DRISDOL) 50000 units CAPS capsule Take 1 capsule (50,000 Units total) by mouth every 7 (seven) days. 4 capsule 2  . zolpidem (AMBIEN) 5 MG tablet Take 1 tablet (5 mg total) by mouth at bedtime. 30 tablet 0   No current facility-administered medications on file prior to visit.      Objective:  Objective  Physical Exam  Constitutional: She is oriented to person, place, and time. She  appears well-developed and well-nourished.  HENT:  Head: Normocephalic and atraumatic.  Eyes: Conjunctivae and EOM are normal.  Neck: Normal range of motion. Neck supple. No JVD present. Carotid bruit is not present. No thyromegaly present.  Cardiovascular: Normal rate, regular rhythm and normal heart sounds.   No murmur heard. Pulmonary/Chest: Effort normal. No respiratory distress. She has decreased breath sounds. She has no wheezes. She has no rales. She exhibits no tenderness.  Musculoskeletal: She exhibits no edema.  Neurological: She is alert and oriented to person, place, and time.  Psychiatric: She has a normal mood and affect.  Nursing note and vitals reviewed.  BP 100/76 (BP Location: Left Arm, Cuff Size: Large)   Pulse 81   Temp 98.2 F (36.8 C) (Oral)   Resp 16   Ht 5\' 3"  (1.6 m)   Wt 297 lb (134.7 kg)   SpO2 93% Comment: 2 liters of O2  BMI 52.61 kg/m  Wt Readings from Last 3 Encounters:  02/27/17 297 lb (134.7 kg)  02/14/17 294 lb (133.4 kg)  01/31/17 294 lb 3.2 oz (133.4 kg)     Lab Results  Component Value Date   WBC 6.1 02/14/2017   HGB 11.9 02/14/2017   HCT 37.1 02/14/2017   PLT 187 02/14/2017   GLUCOSE 81 08/01/2016   CHOL 98 01/15/2016   TRIG 66.0 01/15/2016   HDL 22.60 (L) 01/15/2016   LDLCALC 62 01/15/2016   ALT 20 01/15/2016   AST 49 (H) 01/15/2016   NA 138 08/01/2016   K 3.7 08/01/2016   CL 101 08/01/2016   CREATININE 1.08 (H) 08/01/2016   BUN 11 08/01/2016   CO2 32 (H) 08/01/2016   TSH 3.77 07/19/2016   INR 1.21 12/15/2015   HGBA1C 5.8 07/19/2016    US Abdomen Complete W/elastography  Result Date: 11/04/2016 CLINICAL DATA:  Hepatitis-C EXAM: ULTRASOUND ABDOMEN ULTRASOUND HEPATIC ELASTOGRAPHY TECHNIQUE: Sonography of the upper abdomen was performed. In addition, ultrasound elastography evaluation of the liver was performed. A region of interest was placed within the right lobe of the liver. Following application of a compressive  sonographic pulse, shear waves were detected in the adjacent hepatic tissue and the shear wave velocity was calculated. Multiple assessments were performed at the selected site. Median shear wave velocity is correlated to a Metavir fibrosis score. COMPARISON:  None. FINDINGS: ULTRASOUND ABDOMEN Gallbladder: Mobile echogenic gallstones. Common bile duct: Diameter: Normal at 4 mm Liver: No focal lesion identified. Within normal limits in parenchymal echogenicity. IVC: No abnormality visualized. Pancreas: Visualized portion unremarkable. Spleen: Size and appearance within normal limits. Right Kidney: Length: 10.9 cm. Echogenicity within normal limits. No mass or hydronephrosis visualized. Left Kidney: Length: 9.8 cm. Echogenicity within normal limits. No mass or hydronephrosis visualized. Abdominal aorta: No aneurysm visualized. Other findings: None. ULTRASOUND  HEPATIC ELASTOGRAPHY Device: Siemens Helix VTQ Patient position: Oblique Transducer 61CHD Number of measurements: 10 Hepatic segment:  8 Median velocity:   3.51  m/sec IQR: 0.31 IQR/Median velocity ratio: 0.09 Corresponding Metavir fibrosis score:  Some F3 + F4 Risk of fibrosis: High Limitations of exam: None Pertinent findings noted on other imaging exams:  None Please note that abnormal shear wave velocities may also be identified in clinical settings other than with hepatic fibrosis, such as: acute hepatitis, elevated right heart and central venous pressures including use of beta blockers, veno-occlusive disease (Budd-Chiari), infiltrative processes such as mastocytosis/amyloidosis/infiltrative tumor, extrahepatic cholestasis, in the post-prandial state, and liver transplantation. Correlation with patient history, laboratory data, and clinical condition recommended. IMPRESSION: ULTRASOUND ABDOMEN: Cholelithiasis.  No hepatic lesion identified ULTRASOUND HEPATIC ELASTOGRAPHY: Median hepatic shear wave velocity is calculated at 3.51 m/sec. Corresponding Metavir  fibrosis score is  Some F3 + F4. Risk of fibrosis is high. Follow-up: Followup advise Electronically Signed   By: Suzy Bouchard M.D.   On: 11/04/2016 12:07     Assessment & Plan:  Plan  I have discontinued Ms. Baez's Potassium Chloride ER and Elbasvir-Grazoprevir. I am also having her maintain her OXYGEN, aspirin, nystatin, QUEtiapine, doxylamine (Sleep), Vitamin D (Ergocalciferol), albuterol, levothyroxine, zolpidem, metoprolol succinate, losartan, LORazepam, levocetirizine, fluticasone, ALPRAZolam, and furosemide.  No orders of the defined types were placed in this encounter.   Problem List Items Addressed This Visit      Unprioritized   Hypothyroidism   Relevant Orders   Lipid panel   CBC with Differential/Platelet   TSH   Comprehensive metabolic panel   Chronic respiratory failure ([place]) - Primary    Refer to home health con't pulm f/u      Relevant Orders   Ambulatory referral to [place] panel   CBC with Differential/Platelet   TSH   Comprehensive metabolic panel   HTN (hypertension)    Well controlled, no changes to meds. Encouraged heart healthy diet such as the DASH diet and exercise as tolerated.       Relevant Orders   Lipid panel   CBC with Differential/Platelet   TSH   Comprehensive metabolic panel   TIA (transient ischemic attack)    Stable No new symptoms       Other Visit Diagnoses    History of transient ischemic attack (TIA)       Relevant Orders   Lipid panel   CBC with Differential/Platelet   TSH   Comprehensive metabolic panel   Diastolic dysfunction       Relevant Orders   Lipid panel   CBC with Differential/Platelet   TSH   Comprehensive metabolic panel      Follow-up: Return in about 6 months (around 08/29/2017).  Ann Held, DO

## 2017-02-27 NOTE — Assessment & Plan Note (Signed)
Stable No new symptoms  

## 2017-02-28 LAB — COMPREHENSIVE METABOLIC PANEL
ALBUMIN: 3.4 g/dL — AB (ref 3.5–5.2)
ALK PHOS: 171 U/L — AB (ref 39–117)
ALT: 15 U/L (ref 0–35)
AST: 31 U/L (ref 0–37)
BILIRUBIN TOTAL: 0.5 mg/dL (ref 0.2–1.2)
BUN: 13 mg/dL (ref 6–23)
CO2: 33 mEq/L — ABNORMAL HIGH (ref 19–32)
Calcium: 9 mg/dL (ref 8.4–10.5)
Chloride: 98 mEq/L (ref 96–112)
Creatinine, Ser: 1.2 mg/dL (ref 0.40–1.20)
GFR: 58.05 mL/min — AB (ref >60.00)
GLUCOSE: 227 mg/dL — AB (ref 70–99)
Potassium: 3 mEq/L — ABNORMAL LOW (ref 3.5–5.1)
Sodium: 137 mEq/L (ref 135–145)
TOTAL PROTEIN: 8.1 g/dL (ref 6.0–8.3)

## 2017-02-28 LAB — TSH: TSH: 4.31 u[IU]/mL (ref 0.35–4.50)

## 2017-02-28 LAB — CBC WITH DIFFERENTIAL/PLATELET
Basophils Absolute: 0.1 10*3/uL (ref 0.0–0.1)
Basophils Relative: 1.2 % (ref 0.0–3.0)
Eosinophils Absolute: 0.1 10*3/uL (ref 0.0–0.7)
Eosinophils Relative: 2.2 % (ref 0.0–5.0)
HEMATOCRIT: 38.3 % (ref 36.0–46.0)
HEMOGLOBIN: 12.7 g/dL (ref 12.0–15.0)
LYMPHS PCT: 47.6 % — AB (ref 12.0–46.0)
Lymphs Abs: 2.9 10*3/uL (ref 0.7–4.0)
MCHC: 33.3 g/dL (ref 30.0–36.0)
MCV: 94.7 fl (ref 78.0–100.0)
MONOS PCT: 6.2 % (ref 3.0–12.0)
Monocytes Absolute: 0.4 10*3/uL (ref 0.1–1.0)
Neutro Abs: 2.6 10*3/uL (ref 1.4–7.7)
Neutrophils Relative %: 42.8 % — ABNORMAL LOW (ref 43.0–77.0)
Platelets: 135 10*3/uL — ABNORMAL LOW (ref 150.0–400.0)
RBC: 4.04 Mil/uL (ref 3.87–5.11)
RDW: 13.3 % (ref 11.5–15.5)
WBC: 6.2 10*3/uL (ref 4.0–10.5)

## 2017-02-28 LAB — LIPID PANEL
CHOLESTEROL: 126 mg/dL (ref 0–200)
HDL: 30.3 mg/dL — ABNORMAL LOW (ref >39.00)
LDL Cholesterol: 81 mg/dL (ref 0–99)
NONHDL: 96.04
Total CHOL/HDL Ratio: 4
Triglycerides: 77 mg/dL (ref 0.0–149.0)
VLDL: 15.4 mg/dL (ref 0.0–40.0)

## 2017-03-01 ENCOUNTER — Telehealth: Payer: Self-pay | Admitting: *Deleted

## 2017-03-01 MED ORDER — POTASSIUM CHLORIDE CRYS ER 20 MEQ PO TBCR: 20.0000 meq | EXTENDED_RELEASE_TABLET | Freq: Every day | ORAL | 2 refills | Status: DC

## 2017-03-01 NOTE — Telephone Encounter (Signed)
Error

## 2017-03-03 ENCOUNTER — Telehealth: Payer: Self-pay | Admitting: Family Medicine

## 2017-03-03 NOTE — Telephone Encounter (Signed)
Caller name: Meagan with [employer] Can be reached: [phone]  Reason for call: VO ok to go out to home on Monday 03/06/17 to [facility] RN, PT, and SW. Please call

## 2017-03-03 NOTE — Telephone Encounter (Signed)
Spoke w/ Meagan, verbal orders given.

## 2017-03-25 ENCOUNTER — Encounter (HOSPITAL_COMMUNITY): Payer: Self-pay | Admitting: Emergency Medicine

## 2017-03-25 ENCOUNTER — Emergency Department (HOSPITAL_COMMUNITY)
Admission: EM | Admit: 2017-03-25 | Discharge: 2017-03-26 | Disposition: A | Discharge status: Home / Self Care | Payer: Medicare HMO | Attending: Emergency Medicine | Admitting: Emergency Medicine

## 2017-03-25 ENCOUNTER — Emergency Department (HOSPITAL_COMMUNITY): Payer: Medicare HMO

## 2017-03-25 DIAGNOSIS — E876 Hypokalemia: Secondary | ICD-10-CM

## 2017-03-25 DIAGNOSIS — J441 Chronic obstructive pulmonary disease with (acute) exacerbation: Secondary | ICD-10-CM

## 2017-03-25 DIAGNOSIS — R0602 Shortness of breath: Secondary | ICD-10-CM | POA: Diagnosis not present

## 2017-03-25 DIAGNOSIS — Z87891 Personal history of nicotine dependence: Secondary | ICD-10-CM | POA: Insufficient documentation

## 2017-03-25 DIAGNOSIS — I10 Essential (primary) hypertension: Secondary | ICD-10-CM | POA: Diagnosis not present

## 2017-03-25 DIAGNOSIS — Z79899 Other long term (current) drug therapy: Secondary | ICD-10-CM | POA: Diagnosis not present

## 2017-03-25 LAB — CBC
HEMATOCRIT: 37.6 % (ref 36.0–46.0)
HEMOGLOBIN: 12.8 g/dL (ref 12.0–15.0)
MCH: 31.8 pg (ref 26.0–34.0)
MCHC: 34 g/dL (ref 30.0–36.0)
MCV: 93.5 fL (ref 78.0–100.0)
Platelets: 155 10*3/uL (ref 150–400)
RBC: 4.02 MIL/uL (ref 3.87–5.11)
RDW: 13 % (ref 11.5–15.5)
WBC: 6 10*3/uL (ref 4.0–10.5)

## 2017-03-25 LAB — I-STAT TROPONIN, ED: Troponin i, poc: 0 ng/mL (ref 0.00–0.08)

## 2017-03-25 NOTE — ED Provider Notes (Signed)
[facility] DE[facility] Provider Note: [name], Sue [name], Sue [name]  CSN: [id number] MRN: [id number] ARRIVAL: 03/25/17 at 2158 ROOM: [place]  Sue [name] is a 66 y.o. female with a history of COPD. She is here with an exacerbation of her shortness of breath over the past several days. Symptoms are worse with exertion such as getting up to go to the bathroom. She is not wheezing and she has not been using her inhalers since last year. She denies chest pain.  She has also been having episodes of dizziness this week. She describes the dizziness as the sensation that the room is spinning and symptoms are worse when she moves her head. She denies associated nausea or vomiting. She states the symptoms were moderate to severe yesterday but less severe today. At its worst it made her feel weak like she might pass out.  She also states her legs, which are always slightly swollen, became very swollen yesterday evening. This edema has subsequently improved significantly but some persists.  She has also had the sensation of irregular heartbeats this week. 3 days ago she had an episode that lasted about an hour which felt to her like trigeminy. She is now only having occasional skipped beats.   Past Medical History:  Diagnosis Date  . Depression   . Emphysema/COPD ([place])   . GERD (gastroesophageal reflux disease)   . Hypertension   . Stroke [facility]) 2016   TIA   . Urine incontinence     Past Surgical History:  Procedure Laterality Date  . ABDOMINAL HYSTERECTOMY    . CESAREAN SECTION      Family History  Problem Relation Age of Onset  . Heart disease Father        MVP and Pics Valve  . Hypertension Father   . Depression Father        Institutionalized x's 2 years  . Bipolar disorder Father   . Hypertension Sister   . Diabetes Sister   . Hyperlipidemia Sister   . Heart disease Sister 28       MI  . Heart disease  Brother   . Hypertension Brother   . Heart disease Paternal Grandmother   . Heart disease Paternal Aunt   . Heart disease Paternal Uncle   . Schizophrenia Paternal Aunt     Social History  Substance Use Topics  . Smoking status: Former Smoker    Packs/day: 1.00    Years: 40.00    Types: Cigarettes    Quit date: 10/16/2011  . Smokeless tobacco: Never Used  . Alcohol use Yes     Comment: Occ-- Wine    Prior to Admission medications   Medication Sig Start Date End Date Taking? Authorizing Provider  fluticasone (FLONASE) 50 MCG/ACT nasal spray Place 2 sprays into both nostrils daily. Patient taking differently: Place 2 sprays into both nostrils daily as needed for allergies.  01/31/17  Yes Roma Schanz R, DO  furosemide (LASIX) 20 MG tablet TAKE THREE TABLETS BY MOUTH ONCE DAILY Patient taking differently: TAKE 60 MG  BY MOUTH ONCE DAILY 02/27/17  Yes Roma Schanz R, DO  levothyroxine (SYNTHROID, LEVOTHROID) 50 MCG tablet Take 1 tablet (50 mcg total) by mouth daily. 08/23/16  Yes Roma Schanz R, DO  LORazepam (ATIVAN) 2 MG tablet Take 1 tablet (2 mg total) by mouth every 6 (six) hours as needed for anxiety. 01/31/17  Yes Lowne  Lyndal Pulley R, DO  losartan (COZAAR) 100 MG tablet TAKE 1 TABLET BY MOUTH ONCE DAILY Patient taking differently: TAKE 100 MG BY MOUTH ONCE DAILY 01/24/17  Yes Roma Schanz R, DO  metoprolol succinate (TOPROL-XL) 50 MG 24 hr tablet TAKE TWO TABLETS BY MOUTH IN THE MORNING TAKE WITH OR IMMEDIATELY FOLLOWING A MEAL Patient taking differently: TAKE 100 MG BY MOUTH IN THE MORNING TAKE WITH OR IMMEDIATELY FOLLOWING A MEAL 01/24/17  Yes Roma Schanz R, DO  OXYGEN Inhale 3 L into the lungs continuous.    Yes Provider, Historical, Sue [name]  QUEtiapine (SEROQUEL) 50 MG tablet Take 100 mg by mouth at bedtime.  11/09/15  Yes Provider, Historical, Sue [name]  zolpidem (AMBIEN) 5 MG tablet Take 1 tablet (5 mg total) by mouth at bedtime. Patient taking  differently: Take 10 mg by mouth at bedtime.  08/23/16  Yes Ann Held, DO    Allergies Patient has no known allergies.   REVIEW OF SYSTEMS  Negative except as noted here or in the History of Present Illness.   PHYSICAL EXAMINATION  Initial Vital Signs Blood pressure (!) 150/90, pulse 82, temperature 98.4 F (36.9 C), temperature source Oral, resp. rate 20, SpO2 92 %.  Examination General: Well-developed, well-nourished female in no acute distress; appearance consistent with age of record HENT: normocephalic; atraumatic Eyes: pupils equal, round and reactive to light; extraocular muscles intact Neck: supple Heart: regular rate and rhythm; occasional PVC Lungs: clear to auscultation bilaterally Abdomen: soft; nondistended; nontender; bowel sounds present Extremities: No deformity; full range of motion; pulses normal; +1 pitting edema of lower legs Neurologic: Awake, alert and oriented; motor function intact in all extremities and symmetric; no facial droop Skin: Warm and dry Psychiatric: Normal mood and affect   RESULTS  Summary of this visit's results, reviewed by myself:   EKG Interpretation  Date/Time:  Saturday March 25 2017 22:08:07 EDT Ventricular Rate:  87 PR Interval:    QRS Duration: 91 QT Interval:  467 QTC Calculation: 549 R Axis:   42 Text Interpretation:  Sinus rhythm Multiple premature complexes, vent & supraven Inferior infarct, old Prolonged QT interval Baseline wander in lead(s) II III aVF V1 Ectopy not seen previously Confirmed by Shanon Rosser [phone]) on 03/25/2017 11:35:52 PM      Laboratory Studies: Results for orders placed or performed during the hospital encounter of 03/25/17 (from the past 24 hour(s))  Basic metabolic panel     Status: Abnormal   Collection Time: 03/25/17 11:22 PM  Result Value Ref Range   Sodium 141 135 - 145 mmol/L   Potassium 2.9 (L) 3.5 - 5.1 mmol/L   Chloride 100 (L) 101 - 111 mmol/L   CO2 35 (H) 22 - 32  mmol/L   Glucose, Bld 124 (H) 65 - 99 mg/dL   BUN 13 6 - 20 mg/dL   Creatinine, Ser 1.17 (H) 0.44 - 1.00 mg/dL   Calcium 7.9 (L) 8.9 - 10.3 mg/dL   GFR calc non Af Amer 48 (L) >60 mL/min   GFR calc Af Amer 56 (L) >60 mL/min   Anion gap 6 5 - 15  CBC     Status: None   Collection Time: 03/25/17 11:22 PM  Result Value Ref Range   WBC 6.0 4.0 - 10.5 K/uL   RBC 4.02 3.87 - 5.11 MIL/uL   Hemoglobin 12.8 12.0 - 15.0 g/dL   HCT 37.6 36.0 - 46.0 %   MCV 93.5 78.0 - 100.0 fL  MCH 31.8 26.0 - 34.0 pg   MCHC 34.0 30.0 - 36.0 g/dL   RDW 13.0 11.5 - 15.5 %   Platelets 155 150 - 400 K/uL  Differential     Status: None   Collection Time: 03/25/17 11:22 PM  Result Value Ref Range   Neutrophils Relative % 41 %   Neutro Abs 2.6 1.7 - 7.7 K/uL   Lymphocytes Relative 51 %   Lymphs Abs 3.2 0.7 - 4.0 K/uL   Monocytes Relative 5 %   Monocytes Absolute 0.3 0.1 - 1.0 K/uL   Eosinophils Relative 2 %   Eosinophils Absolute 0.1 0.0 - 0.7 K/uL   Basophils Relative 1 %   Basophils Absolute 0.0 0.0 - 0.1 K/uL  I-stat troponin, ED     Status: None   Collection Time: 03/25/17 11:33 PM  Result Value Ref Range   Troponin i, poc 0.00 0.00 - 0.08 ng/mL   Comment 3          Brain natriuretic peptide     Status: None   Collection Time: 03/25/17 11:35 PM  Result Value Ref Range   B Natriuretic Peptide 15.3 0.0 - 100.0 pg/mL  Urinalysis, Routine w reflex microscopic     Status: Abnormal   Collection Time: 03/25/17 11:49 PM  Result Value Ref Range   Color, Urine COLORLESS (A) YELLOW   APPearance CLEAR CLEAR   Specific Gravity, Urine 1.002 (L) 1.005 - 1.030   pH 7.0 5.0 - 8.0   Glucose, UA NEGATIVE NEGATIVE mg/dL   Hgb urine dipstick NEGATIVE NEGATIVE   Bilirubin Urine NEGATIVE NEGATIVE   Ketones, ur NEGATIVE NEGATIVE mg/dL   Protein, ur NEGATIVE NEGATIVE mg/dL   Nitrite NEGATIVE NEGATIVE   Leukocytes, UA NEGATIVE NEGATIVE   Imaging Studies: Dg Chest 2 View  Result Date: 03/25/2017 CLINICAL DATA:   Shortness of breath and intermittent irregular heartbeat for 1 week. Presyncope and dizziness. History of COPD, stroke, hypertension. EXAM: CHEST  2 VIEW COMPARISON:  Chest radiograph December 24, 2014 FINDINGS: The cardiac silhouette is upper limits of normal in size, mediastinal silhouette is unremarkable. Bronchitic changes and increased lung volumes without pleural effusion or focal consolidation. Pulmonary vascular congestion. No pneumothorax. Large body habitus. Osseous structures are nonsuspicious. IMPRESSION: Borderline cardiomegaly and pulmonary vascular congestion. COPD. Electronically Signed   By: Elon Alas M.D.   On: 03/25/2017 23:10    ED COURSE  Nursing notes and initial vitals signs, including pulse oximetry, reviewed.  Vitals:   03/26/17 0015 03/26/17 0039 03/26/17 0115 03/26/17 0230  BP:  105/74  (!) 106/57  Pulse: 72 70 67 68  Resp: 17 13 15 15   Temp:      TempSrc:      SpO2: 100% 96% 96% 97%   3:59 AM Air movement improved after DuoNeb treatment. Patient also given K Dur for hypokalemia.   4:08 AM Patient ambulated in the hallway with significantly less dyspnea than earlier. She admits that she has not used her albuterol inhaler in over a year and it probably "several years old". We will obtain a new inhaler and AeroChamber for her and re-instruct her in its use.  PROCEDURES    ED DIAGNOSES     ICD-10-CM   1. COPD exacerbation ([place]) J44.1   2. Hypokalemia E87.6        Daran Favaro, Jenny Reichmann, Sue [name] 03/26/17 [phone]

## 2017-03-25 NOTE — ED Triage Notes (Signed)
Pt states she has had dizziness, shortness of breath, irregular heartbeat off and on for the past week  Denies any chest pain or discomfort  Pt states Wednesday night she felt dizzy and weak like she might pass out  Pt states she has swelling in her lower legs and feet

## 2017-03-26 LAB — URINALYSIS, ROUTINE W REFLEX MICROSCOPIC
Bilirubin Urine: NEGATIVE
GLUCOSE, UA: NEGATIVE mg/dL
HGB URINE DIPSTICK: NEGATIVE
Ketones, ur: NEGATIVE mg/dL
LEUKOCYTES UA: NEGATIVE
Nitrite: NEGATIVE
PH: 7 (ref 5.0–8.0)
PROTEIN: NEGATIVE mg/dL
Specific Gravity, Urine: 1.002 — ABNORMAL LOW (ref 1.005–1.030)

## 2017-03-26 LAB — DIFFERENTIAL
Basophils Absolute: 0 10*3/uL (ref 0.0–0.1)
Basophils Relative: 1 %
Eosinophils Absolute: 0.1 10*3/uL (ref 0.0–0.7)
Eosinophils Relative: 2 %
LYMPHS ABS: 3.2 10*3/uL (ref 0.7–4.0)
LYMPHS PCT: 51 %
MONO ABS: 0.3 10*3/uL (ref 0.1–1.0)
MONOS PCT: 5 %
NEUTROS ABS: 2.6 10*3/uL (ref 1.7–7.7)
Neutrophils Relative %: 41 %

## 2017-03-26 LAB — BASIC METABOLIC PANEL
Anion gap: 6 (ref 5–15)
BUN: 13 mg/dL (ref 6–20)
CHLORIDE: 100 mmol/L — AB (ref 101–111)
CO2: 35 mmol/L — ABNORMAL HIGH (ref 22–32)
Calcium: 7.9 mg/dL — ABNORMAL LOW (ref 8.9–10.3)
Creatinine, Ser: 1.17 mg/dL — ABNORMAL HIGH (ref 0.44–1.00)
GFR calc Af Amer: 56 mL/min — ABNORMAL LOW (ref >60)
GFR calc non Af Amer: 48 mL/min — ABNORMAL LOW (ref >60)
Glucose, Bld: 124 mg/dL — ABNORMAL HIGH (ref 65–99)
POTASSIUM: 2.9 mmol/L — AB (ref 3.5–5.1)
SODIUM: 141 mmol/L (ref 135–145)

## 2017-03-26 LAB — BRAIN NATRIURETIC PEPTIDE: B NATRIURETIC PEPTIDE 5: 15.3 pg/mL (ref 0.0–100.0)

## 2017-03-26 MED ORDER — IPRATROPIUM-ALBUTEROL 0.5-2.5 (3) MG/3ML IN SOLN
3.0000 mL | RESPIRATORY_TRACT | Status: DC
  Administered 2017-03-26: 3 mL via RESPIRATORY_TRACT
  Filled 2017-03-26: qty 3

## 2017-03-26 MED ORDER — POTASSIUM CHLORIDE CRYS ER 20 MEQ PO TBCR
40.0000 meq | EXTENDED_RELEASE_TABLET | Freq: Once | ORAL | Status: AC
  Administered 2017-03-26: 40 meq via ORAL
  Filled 2017-03-26: qty 2

## 2017-03-26 MED ORDER — ALBUTEROL SULFATE HFA 108 (90 BASE) MCG/ACT IN AERS
2.0000 | INHALATION_SPRAY | RESPIRATORY_TRACT | Status: DC | PRN
  Administered 2017-03-26: 2 via RESPIRATORY_TRACT
  Filled 2017-03-26: qty 6.7

## 2017-03-26 MED ORDER — POTASSIUM CHLORIDE CRYS ER 20 MEQ PO TBCR: 20.0000 meq | EXTENDED_RELEASE_TABLET | Freq: Two times a day (BID) | ORAL | 0 refills | Status: DC

## 2017-03-26 NOTE — ED Notes (Signed)
Increased shortness of breath with activity noted able to speak in full sentences no respiratory or acute distress noted family at bedside.

## 2017-03-26 NOTE — ED Notes (Signed)
SpO2 stayed 98% on 2L [place] while ambulating.

## 2017-03-28 ENCOUNTER — Ambulatory Visit: Payer: Medicare HMO | Admitting: Family Medicine

## 2017-03-28 ENCOUNTER — Telehealth: Payer: Self-pay | Admitting: Family Medicine

## 2017-03-28 NOTE — Telephone Encounter (Signed)
charge 

## 2017-03-28 NOTE — Telephone Encounter (Signed)
Daughter lvm at 8:21am cancelling 10:15am appointment for today, charge or no charge

## 2017-03-31 ENCOUNTER — Other Ambulatory Visit: Payer: Self-pay | Admitting: Family Medicine

## 2017-03-31 ENCOUNTER — Encounter: Payer: Self-pay | Admitting: Family Medicine

## 2017-03-31 ENCOUNTER — Ambulatory Visit (HOSPITAL_BASED_OUTPATIENT_CLINIC_OR_DEPARTMENT_OTHER)
Admission: RE | Admit: 2017-03-31 | Discharge: 2017-03-31 | Disposition: A | Discharge status: Home / Self Care | Payer: Medicare HMO | Source: Ambulatory Visit | Attending: Family Medicine | Admitting: Family Medicine

## 2017-03-31 ENCOUNTER — Ambulatory Visit (INDEPENDENT_AMBULATORY_CARE_PROVIDER_SITE_OTHER): Payer: Medicare HMO | Admitting: Family Medicine

## 2017-03-31 VITALS — BP 130/86 | HR 85 | Temp 98.4°F | Resp 16 | Ht 63.0 in | Wt 294.8 lb

## 2017-03-31 DIAGNOSIS — J441 Chronic obstructive pulmonary disease with (acute) exacerbation: Secondary | ICD-10-CM

## 2017-03-31 DIAGNOSIS — I509 Heart failure, unspecified: Secondary | ICD-10-CM

## 2017-03-31 DIAGNOSIS — R918 Other nonspecific abnormal finding of lung field: Secondary | ICD-10-CM | POA: Diagnosis not present

## 2017-03-31 DIAGNOSIS — R0602 Shortness of breath: Secondary | ICD-10-CM

## 2017-03-31 DIAGNOSIS — E876 Hypokalemia: Secondary | ICD-10-CM | POA: Diagnosis not present

## 2017-03-31 DIAGNOSIS — I517 Cardiomegaly: Secondary | ICD-10-CM | POA: Diagnosis not present

## 2017-03-31 LAB — CBC WITH DIFFERENTIAL/PLATELET
Basophils Absolute: 0 10*3/uL (ref 0.0–0.1)
Basophils Relative: 0.4 % (ref 0.0–3.0)
EOS PCT: 1.7 % (ref 0.0–5.0)
Eosinophils Absolute: 0.1 10*3/uL (ref 0.0–0.7)
HCT: 39.8 % (ref 36.0–46.0)
HEMOGLOBIN: 13.3 g/dL (ref 12.0–15.0)
Lymphocytes Relative: 52.4 % — ABNORMAL HIGH (ref 12.0–46.0)
Lymphs Abs: 3.7 10*3/uL (ref 0.7–4.0)
MCHC: 33.4 g/dL (ref 30.0–36.0)
MCV: 95.2 fl (ref 78.0–100.0)
MONOS PCT: 5 % (ref 3.0–12.0)
Monocytes Absolute: 0.4 10*3/uL (ref 0.1–1.0)
Neutro Abs: 2.8 10*3/uL (ref 1.4–7.7)
Neutrophils Relative %: 40.5 % — ABNORMAL LOW (ref 43.0–77.0)
Platelets: 183 10*3/uL (ref 150.0–400.0)
RBC: 4.19 Mil/uL (ref 3.87–5.11)
RDW: 13.5 % (ref 11.5–15.5)
WBC: 7 10*3/uL (ref 4.0–10.5)

## 2017-03-31 LAB — COMPREHENSIVE METABOLIC PANEL
ALT: 10 U/L (ref 0–35)
AST: 23 U/L (ref 0–37)
Albumin: 3.6 g/dL (ref 3.5–5.2)
Alkaline Phosphatase: 136 U/L — ABNORMAL HIGH (ref 39–117)
BILIRUBIN TOTAL: 0.5 mg/dL (ref 0.2–1.2)
BUN: 15 mg/dL (ref 6–23)
CHLORIDE: 99 meq/L (ref 96–112)
CO2: 32 meq/L (ref 19–32)
CREATININE: 1.16 mg/dL (ref 0.40–1.20)
Calcium: 10.4 mg/dL (ref 8.4–10.5)
GFR: 60.36 mL/min (ref >60.00)
GLUCOSE: 123 mg/dL — AB (ref 70–99)
Potassium: 3.5 mEq/L (ref 3.5–5.1)
Sodium: 139 mEq/L (ref 135–145)
Total Protein: 8.5 g/dL — ABNORMAL HIGH (ref 6.0–8.3)

## 2017-03-31 MED ORDER — POTASSIUM CHLORIDE CRYS ER 20 MEQ PO TBCR: 20.0000 meq | EXTENDED_RELEASE_TABLET | Freq: Every day | ORAL | 2 refills | Status: DC

## 2017-03-31 MED ORDER — IPRATROPIUM-ALBUTEROL 0.5-2.5 (3) MG/3ML IN SOLN
3.0000 mL | Freq: Once | RESPIRATORY_TRACT | Status: AC
  Administered 2017-03-31: 3 mL via RESPIRATORY_TRACT

## 2017-03-31 MED ORDER — ALBUTEROL SULFATE (2.5 MG/3ML) 0.083% IN NEBU: 2.5000 mg | INHALATION_SOLUTION | Freq: Four times a day (QID) | RESPIRATORY_TRACT | 1 refills | Status: DC | PRN

## 2017-03-31 MED ORDER — POTASSIUM CHLORIDE CRYS ER 20 MEQ PO TBCR
20.0000 meq | EXTENDED_RELEASE_TABLET | Freq: Every day | ORAL | 2 refills | Status: DC
Start: 2017-03-31 — End: 2017-03-31

## 2017-03-31 NOTE — Progress Notes (Signed)
Patient ID: Sue [name], female    DOB: 1951/05/20  Age: 66 y.o. MRN: [id number]    Subjective:  Subjective  HPI Sue [name] presents for f/u from er for exac copd and low K.  Pt is feeling better but still sob easy.  No other new complaints.    Review of Systems  Constitutional: Positive for fatigue. Negative for activity change, appetite change and unexpected weight change.  Respiratory: Positive for cough, chest tightness, shortness of breath and wheezing.   Cardiovascular: Negative for chest pain and palpitations.  Psychiatric/Behavioral: Negative for behavioral problems and dysphoric mood. The patient is not nervous/anxious.     History Past Medical History:  Diagnosis Date  . Depression   . Emphysema/COPD ([place])   . GERD (gastroesophageal reflux disease)   . Hypertension   . Stroke [facility]) 2016   TIA   . Urine incontinence     She has a past surgical history that includes Abdominal hysterectomy and Cesarean section.   Her family history includes Bipolar disorder in her father; Depression in her father; Diabetes in her sister; Heart disease in her brother, father, paternal aunt, paternal grandmother, and paternal uncle; Heart disease (age of onset: 74) in her sister; Hyperlipidemia in her sister; Hypertension in her brother, father, and sister; Schizophrenia in her paternal aunt.She reports that she quit smoking about 5 years ago. Her smoking use included Cigarettes. She has a 40.00 pack-year smoking history. She has never used smokeless tobacco. She reports that she drinks alcohol. She reports that she does not use drugs.  Current Outpatient Prescriptions on File Prior to Visit  Medication Sig Dispense Refill  . fluticasone (FLONASE) 50 MCG/ACT nasal spray Place 2 sprays into both nostrils daily. (Patient taking differently: Place 2 sprays into both nostrils daily as needed for allergies. ) 16 g 6  . furosemide (LASIX) 20 MG tablet TAKE THREE TABLETS BY MOUTH ONCE DAILY  (Patient taking differently: TAKE 60 MG  BY MOUTH ONCE DAILY) 270 tablet 1  . levothyroxine (SYNTHROID, LEVOTHROID) 50 MCG tablet Take 1 tablet (50 mcg total) by mouth daily. 30 tablet 11  . LORazepam (ATIVAN) 2 MG tablet Take 1 tablet (2 mg total) by mouth every 6 (six) hours as needed for anxiety. 30 tablet 0  . losartan (COZAAR) 100 MG tablet TAKE 1 TABLET BY MOUTH ONCE DAILY (Patient taking differently: TAKE 100 MG BY MOUTH ONCE DAILY) 90 tablet 0  . metoprolol succinate (TOPROL-XL) 50 MG 24 hr tablet TAKE TWO TABLETS BY MOUTH IN THE MORNING TAKE WITH OR IMMEDIATELY FOLLOWING A MEAL (Patient taking differently: TAKE 100 MG BY MOUTH IN THE MORNING TAKE WITH OR IMMEDIATELY FOLLOWING A MEAL) 60 tablet 5  . OXYGEN Inhale 3 L into the lungs continuous.     Marland Kitchen QUEtiapine (SEROQUEL) 50 MG tablet Take 100 mg by mouth at bedtime.   3  . zolpidem (AMBIEN) 5 MG tablet Take 1 tablet (5 mg total) by mouth at bedtime. (Patient taking differently: Take 10 mg by mouth at bedtime. ) 30 tablet 0   No current facility-administered medications on file prior to visit.      Objective:  Objective  Physical Exam  Constitutional: She is oriented to person, place, and time. She appears well-developed and well-nourished.  HENT:  Right Ear: External ear normal.  Left Ear: External ear normal.  + PND + errythema  Eyes: Conjunctivae are normal. Right eye exhibits no discharge. Left eye exhibits no discharge.  Cardiovascular: Normal rate, regular  rhythm and normal heart sounds.   No murmur heard. Pulmonary/Chest: Effort normal. No respiratory distress. She has decreased breath sounds. She has no wheezes. She has no rales. She exhibits no tenderness.  Musculoskeletal: She exhibits no edema.  Lymphadenopathy:    She has no cervical adenopathy.  Neurological: She is alert and oriented to person, place, and time.  Nursing note and vitals reviewed.  BP 130/86 (BP Location: Left Arm, Cuff Size: Large)   Pulse 85    Temp 98.4 F (36.9 C) (Oral)   Resp 16   Ht 5\' 3"  (1.6 m)   Wt 294 lb 12.8 oz (133.7 kg)   SpO2 91% Comment: with 3 liters of oxygen  BMI 52.22 kg/m  Wt Readings from Last 3 Encounters:  03/31/17 294 lb 12.8 oz (133.7 kg)  02/27/17 297 lb (134.7 kg)  02/14/17 294 lb (133.4 kg)     Lab Results  Component Value Date   WBC 6.0 03/25/2017   HGB 12.8 03/25/2017   HCT 37.6 03/25/2017   PLT 155 03/25/2017   GLUCOSE 124 (H) 03/25/2017   CHOL 126 02/27/2017   TRIG 77.0 02/27/2017   HDL 30.30 (L) 02/27/2017   LDLCALC 81 02/27/2017   ALT 15 02/27/2017   AST 31 02/27/2017   NA 141 03/25/2017   K 2.9 (L) 03/25/2017   CL 100 (L) 03/25/2017   CREATININE 1.17 (H) 03/25/2017   BUN 13 03/25/2017   CO2 35 (H) 03/25/2017   TSH 4.31 02/27/2017   INR 1.21 12/15/2015   HGBA1C 5.8 07/19/2016    Dg Chest 2 View  Result Date: 03/25/2017 CLINICAL DATA:  Shortness of breath and intermittent irregular heartbeat for 1 week. Presyncope and dizziness. History of COPD, stroke, hypertension. EXAM: CHEST  2 VIEW COMPARISON:  Chest radiograph December 24, 2014 FINDINGS: The cardiac silhouette is upper limits of normal in size, mediastinal silhouette is unremarkable. Bronchitic changes and increased lung volumes without pleural effusion or focal consolidation. Pulmonary vascular congestion. No pneumothorax. Large body habitus. Osseous structures are nonsuspicious. IMPRESSION: Borderline cardiomegaly and pulmonary vascular congestion. COPD. Electronically Signed   By: Elon Alas M.D.   On: 03/25/2017 23:10     Assessment & Plan:  Plan  I have changed Ms. Fawaz's potassium chloride SA. I am also having her maintain her OXYGEN, QUEtiapine, levothyroxine, zolpidem, metoprolol succinate, losartan, LORazepam, fluticasone, furosemide, and albuterol. We administered ipratropium-albuterol.  Meds ordered this encounter  Medications  . ipratropium-albuterol (DUONEB) 0.5-2.5 (3) MG/3ML nebulizer solution 3 mL   . DISCONTD: albuterol (PROVENTIL) (2.5 MG/3ML) 0.083% nebulizer solution    Sig: Take 3 mLs (2.5 mg total) by nebulization every 6 (six) hours as needed for wheezing or shortness of breath.    Dispense:  150 mL    Refill:  1  . albuterol (PROVENTIL) (2.5 MG/3ML) 0.083% nebulizer solution    Sig: Take 3 mLs (2.5 mg total) by nebulization every 6 (six) hours as needed for wheezing or shortness of breath.    Dispense:  150 mL    Refill:  1  . potassium chloride SA (K-DUR,KLOR-CON) 20 MEQ tablet    Sig: Take 1 tablet (20 mEq total) by mouth daily.    Dispense:  30 tablet    Refill:  2    Problem List Items Addressed This Visit      Unprioritized   Hypokalemia   Relevant Medications   potassium chloride SA (K-DUR,KLOR-CON) 20 MEQ tablet   Other Relevant Orders   Comprehensive metabolic panel  Other Visit Diagnoses    COPD exacerbation ([place])    -  Primary   Relevant Medications   ipratropium-albuterol (DUONEB) 0.5-2.5 (3) MG/3ML nebulizer solution 3 mL (Completed)   albuterol (PROVENTIL) (2.5 MG/3ML) 0.083% nebulizer solution   SOB (shortness of breath)       Relevant Orders   Comprehensive metabolic panel   CBC with Differential/Platelet   DG Chest 2 View   ECHOCARDIOGRAM COMPLETE      Follow-up: Return in about 3 months (around 07/01/2017), or if symptoms worsen or fail to improve.  Ann Held, DO

## 2017-03-31 NOTE — Patient Instructions (Signed)

## 2017-03-31 NOTE — Progress Notes (Signed)
Patient ID: Sue [name], female   DOB: [birth year], 66 y.o.   MRN: [id number]     Subjective:  I acted as a Education administrator for Dr. Carollee Herter.  [name], [name]   Patient ID: Sue [name], female    DOB: 09-15-1950, 66 y.o.   MRN: [id number]  Chief Complaint  Patient presents with  . COPD    HPI  Patient is in today for ER follow up from  03/25/17 for COPD.  She states that she have been doing ok except for some shortness of breath.  No fever. No mucus production.    Patient Care Team: Carollee Herter, Alferd Apa, DO as PCP - General (Family Medicine) Lelon Perla, MD as Referring Physician (Internal Medicine) Lelon Perla, MD as Referring Physician (Internal Medicine) Carollee Herter, Alferd Apa, DO (Family Medicine)   Past Medical History:  Diagnosis Date  . Depression   . Emphysema/COPD ([place])   . GERD (gastroesophageal reflux disease)   . Hypertension   . Stroke [facility]) 2016   TIA   . Urine incontinence     Past Surgical History:  Procedure Laterality Date  . ABDOMINAL HYSTERECTOMY    . CESAREAN SECTION      Family History  Problem Relation Age of Onset  . Heart disease Father        MVP and Pics Valve  . Hypertension Father   . Depression Father        Institutionalized x's 2 years  . Bipolar disorder Father   . Hypertension Sister   . Diabetes Sister   . Hyperlipidemia Sister   . Heart disease Sister 62       MI  . Heart disease Brother   . Hypertension Brother   . Heart disease Paternal Grandmother   . Heart disease Paternal Aunt   . Heart disease Paternal Uncle   . Schizophrenia Paternal Aunt     Social History   Social History  . Marital status: Single    Spouse name: N/A  . Number of children: N/A  . Years of education: N/A   Occupational History  . disabled- Occupational hygienist Med    Social History Main Topics  . Smoking status: Former Smoker    Packs/day: 1.00    Years: 40.00    Types: Cigarettes    Quit date: 10/16/2011  . Smokeless  tobacco: Never Used  . Alcohol use Yes     Comment: Occ-- Wine  . Drug use: No  . Sexual activity: Not on file   Other Topics Concern  . Not on file   Social History Narrative  . No narrative on file    Outpatient Medications Prior to Visit  Medication Sig Dispense Refill  . fluticasone (FLONASE) 50 MCG/ACT nasal spray Place 2 sprays into both nostrils daily. (Patient taking differently: Place 2 sprays into both nostrils daily as needed for allergies. ) 16 g 6  . furosemide (LASIX) 20 MG tablet TAKE THREE TABLETS BY MOUTH ONCE DAILY (Patient taking differently: TAKE 2 TABLETS TWICE DAILY) 270 tablet 1  . levothyroxine (SYNTHROID, LEVOTHROID) 50 MCG tablet Take 1 tablet (50 mcg total) by mouth daily. 30 tablet 11  . LORazepam (ATIVAN) 2 MG tablet Take 1 tablet (2 mg total) by mouth every 6 (six) hours as needed for anxiety. 30 tablet 0  . losartan (COZAAR) 100 MG tablet TAKE 1 TABLET BY MOUTH ONCE DAILY (Patient taking differently: TAKE 100 MG BY MOUTH ONCE DAILY) 90 tablet 0  .  metoprolol succinate (TOPROL-XL) 50 MG 24 hr tablet TAKE TWO TABLETS BY MOUTH IN THE MORNING TAKE WITH OR IMMEDIATELY FOLLOWING A MEAL (Patient taking differently: TAKE 100 MG BY MOUTH IN THE MORNING TAKE WITH OR IMMEDIATELY FOLLOWING A MEAL) 60 tablet 5  . OXYGEN Inhale 3 L into the lungs continuous.     Marland Kitchen QUEtiapine (SEROQUEL) 50 MG tablet Take 100 mg by mouth at bedtime.   3  . zolpidem (AMBIEN) 5 MG tablet Take 1 tablet (5 mg total) by mouth at bedtime. (Patient taking differently: Take 10 mg by mouth at bedtime. ) 30 tablet 0  . potassium chloride SA (K-DUR,KLOR-CON) 20 MEQ tablet Take 1 tablet (20 mEq total) by mouth 2 (two) times daily. 14 tablet 0   No facility-administered medications prior to visit.     No Known Allergies  Review of Systems  Constitutional: Negative for chills, fever and malaise/fatigue.  HENT: Negative for congestion and hearing loss.   Eyes: Negative for blurred vision and  discharge.  Respiratory: Positive for cough, shortness of breath and wheezing. Negative for sputum production.   Cardiovascular: Negative for chest pain, palpitations and leg swelling.  Gastrointestinal: Negative for abdominal pain, blood in stool, constipation, diarrhea, heartburn, nausea and vomiting.  Genitourinary: Negative for dysuria, frequency, hematuria and urgency.  Musculoskeletal: Negative for back pain, falls and myalgias.  Skin: Negative for rash.  Neurological: Negative for dizziness, sensory change, loss of consciousness, weakness and headaches.  Endo/Heme/Allergies: Negative for environmental allergies. Does not bruise/bleed easily.  Psychiatric/Behavioral: Negative for depression and suicidal ideas. The patient is not nervous/anxious and does not have insomnia.        Objective:    Physical Exam  Constitutional: She is oriented to person, place, and time. She appears well-developed and well-nourished.  HENT:  Right Ear: External ear normal.  Left Ear: External ear normal.  + PND + errythema  Eyes: Conjunctivae are normal. Right eye exhibits no discharge. Left eye exhibits no discharge.  Cardiovascular: Normal rate, regular rhythm and normal heart sounds.   No murmur heard. Pulmonary/Chest: Effort normal. No respiratory distress. She has decreased breath sounds. She has wheezes. She has no rales.     She exhibits no tenderness.  Musculoskeletal: She exhibits no edema.  Lymphadenopathy:    She has cervical adenopathy.  Neurological: She is alert and oriented to person, place, and time.  Nursing note and vitals reviewed.   BP 130/86 (BP Location: Left Arm, Cuff Size: Large)   Pulse 85   Temp 98.4 F (36.9 C) (Oral)   Resp 16   Ht 5\' 3"  (1.6 m)   Wt 294 lb 12.8 oz (133.7 kg)   SpO2 91% Comment: with 3 liters of oxygen  BMI 52.22 kg/m  Wt Readings from Last 3 Encounters:  03/31/17 294 lb 12.8 oz (133.7 kg)  02/27/17 297 lb (134.7 kg)  02/14/17 294 lb  (133.4 kg)   BP Readings from Last 3 Encounters:  03/31/17 130/86  03/26/17 (!) 119/54  02/27/17 100/76     Immunization History  Administered Date(s) Administered  . Influenza,inj,Quad PF,36+ Mos 08/21/2014, 10/27/2015, 08/23/2016  . Influenza-Unspecified 06/12/2013  . Pneumococcal Polysaccharide-23 09/12/2012  . Tdap 09/12/2009    Health Maintenance  Topic Date Due  . COLONOSCOPY  07/21/2002  . MAMMOGRAM  01/01/2015  . INFLUENZA VACCINE  04/12/2017  . TETANUS/TDAP  09/13/2019  . Hepatitis C Screening  Completed  . HIV Screening  Completed    Lab Results  Component Value Date  WBC 7.0 03/31/2017   HGB 13.3 03/31/2017   HCT 39.8 03/31/2017   PLT 183.0 03/31/2017   GLUCOSE 123 (H) 03/31/2017   CHOL 126 02/27/2017   TRIG 77.0 02/27/2017   HDL 30.30 (L) 02/27/2017   LDLCALC 81 02/27/2017   ALT 10 03/31/2017   AST 23 03/31/2017   NA 139 03/31/2017   K 3.5 03/31/2017   CL 99 03/31/2017   CREATININE 1.16 03/31/2017   BUN 15 03/31/2017   CO2 32 03/31/2017   TSH 4.31 02/27/2017   INR 1.21 12/15/2015   HGBA1C 5.8 07/19/2016    Lab Results  Component Value Date   TSH 4.31 02/27/2017   Lab Results  Component Value Date   WBC 7.0 03/31/2017   HGB 13.3 03/31/2017   HCT 39.8 03/31/2017   MCV 95.2 03/31/2017   PLT 183.0 03/31/2017   Lab Results  Component Value Date   NA 139 03/31/2017   K 3.5 03/31/2017   CO2 32 03/31/2017   GLUCOSE 123 (H) 03/31/2017   BUN 15 03/31/2017   CREATININE 1.16 03/31/2017   BILITOT 0.5 03/31/2017   ALKPHOS 136 (H) 03/31/2017   AST 23 03/31/2017   ALT 10 03/31/2017   PROT 8.5 (H) 03/31/2017   ALBUMIN 3.6 03/31/2017   CALCIUM 10.4 03/31/2017   ANIONGAP 6 03/25/2017   GFR 60.36 03/31/2017   Lab Results  Component Value Date   CHOL 126 02/27/2017   Lab Results  Component Value Date   HDL 30.30 (L) 02/27/2017   Lab Results  Component Value Date   LDLCALC 81 02/27/2017   Lab Results  Component Value Date   TRIG 77.0  02/27/2017   Lab Results  Component Value Date   CHOLHDL 4 02/27/2017   Lab Results  Component Value Date   HGBA1C 5.8 07/19/2016         Assessment & Plan:   Problem List Items Addressed This Visit      Unprioritized   Hypokalemia   Relevant Medications   potassium chloride SA (K-DUR,KLOR-CON) 20 MEQ tablet   Other Relevant Orders   Comprehensive metabolic panel (Completed)    Other Visit Diagnoses    COPD exacerbation ([place])    -  Primary   Relevant Medications   ipratropium-albuterol (DUONEB) 0.5-2.5 (3) MG/3ML nebulizer solution 3 mL (Completed)   albuterol (PROVENTIL) (2.5 MG/3ML) 0.083% nebulizer solution   SOB (shortness of breath)       Relevant Orders   Comprehensive metabolic panel (Completed)   CBC with Differential/Platelet (Completed)   DG Chest 2 View (Completed)   ECHOCARDIOGRAM COMPLETE      I am having Ms. Rasmus maintain her OXYGEN, QUEtiapine, levothyroxine, zolpidem, metoprolol succinate, losartan, LORazepam, fluticasone, furosemide, albuterol, and potassium chloride SA. We administered ipratropium-albuterol.  Meds ordered this encounter  Medications  . ipratropium-albuterol (DUONEB) 0.5-2.5 (3) MG/3ML nebulizer solution 3 mL  . DISCONTD: albuterol (PROVENTIL) (2.5 MG/3ML) 0.083% nebulizer solution    Sig: Take 3 mLs (2.5 mg total) by nebulization every 6 (six) hours as needed for wheezing or shortness of breath.    Dispense:  150 mL    Refill:  1  . albuterol (PROVENTIL) (2.5 MG/3ML) 0.083% nebulizer solution    Sig: Take 3 mLs (2.5 mg total) by nebulization every 6 (six) hours as needed for wheezing or shortness of breath.    Dispense:  150 mL    Refill:  1  . DISCONTD: potassium chloride SA (K-DUR,KLOR-CON) 20 MEQ tablet    Sig:  Take 1 tablet (20 mEq total) by mouth daily.    Dispense:  30 tablet    Refill:  2  . potassium chloride SA (K-DUR,KLOR-CON) 20 MEQ tablet    Sig: Take 1 tablet (20 mEq total) by mouth daily.    Dispense:  30  tablet    Refill:  2    CMA served as scribe during this visit. History, Physical and Plan performed by medical provider. Documentation and orders reviewed and attested to.  Ann Held, DO

## 2017-04-04 ENCOUNTER — Ambulatory Visit (INDEPENDENT_AMBULATORY_CARE_PROVIDER_SITE_OTHER): Payer: Medicare HMO | Admitting: Cardiology

## 2017-04-04 ENCOUNTER — Encounter: Payer: Self-pay | Admitting: Cardiology

## 2017-04-04 VITALS — BP 126/80 | HR 80 | Ht 63.0 in | Wt 297.0 lb

## 2017-04-04 DIAGNOSIS — R0609 Other forms of dyspnea: Secondary | ICD-10-CM

## 2017-04-04 DIAGNOSIS — J438 Other emphysema: Secondary | ICD-10-CM | POA: Diagnosis not present

## 2017-04-04 NOTE — Patient Instructions (Signed)
Medication Instructions:  Your physician recommends that you continue on your current medications as directed. Please refer to the Current Medication list given to you today.   Labwork: None   Testing/Procedures: Your physician has requested that you have an echocardiogram. Echocardiography is a painless test that uses sound waves to create images of your heart. It provides your doctor with information about the size and shape of your heart and how well your heart's chambers and valves are working. This procedure takes approximately one hour. There are no restrictions for this procedure.    Follow-Up: As needed       If you need a refill on your cardiac medications before your next appointment, please call your pharmacy.

## 2017-04-04 NOTE — Progress Notes (Signed)
Cardiology Office Note:    Date:  04/04/2017   ID:  Sue [name], DOB 06-23-1951, MRN [id number]  PCP:  Carollee Herter, Alferd Apa, DO  Cardiologist:  Candee Furbish, MD    Referring MD: Carollee Herter, Alferd Apa, *     History of Present Illness:    Sue [name] is a 66 y.o. female with a hx of GERD, emphysema, stroke, hypertension here for evaluation of heart failure at the request of [place].  She has a history of COPD and recently was in the ER for this in July 2018.  An echocardiogram was reviewed from 07/30/2014 which showed normal ejection fraction and grade 1 diastolic dysfunction.  On 03/31/17 a chest x-ray showed suggestion of congestive heart failure.  Still feeling more SOB with exertion. Room to room. Laying down, feels like chest is not expanding. Has to turn oxygen [place] up.   One night felt like Trigemy. Felt PVCs for years, since 75's. She is a Immunologist  She also has noted intermittent pedal edema.  Past Medical History:  Diagnosis Date  . Depression   . Emphysema/COPD ([place])   . GERD (gastroesophageal reflux disease)   . Hypertension   . Stroke [facility]) 2016   TIA   . Urine incontinence     Past Surgical History:  Procedure Laterality Date  . ABDOMINAL HYSTERECTOMY    . CESAREAN SECTION      Current Medications: Current Meds  Medication Sig  . albuterol (PROVENTIL) (2.5 MG/3ML) 0.083% nebulizer solution Take 3 mLs (2.5 mg total) by nebulization every 6 (six) hours as needed for wheezing or shortness of breath.  . fluticasone (FLONASE) 50 MCG/ACT nasal spray Place 2 sprays into both nostrils daily. (Patient taking differently: Place 2 sprays into both nostrils daily as needed for allergies. )  . furosemide (LASIX) 20 MG tablet TAKE THREE TABLETS BY MOUTH ONCE DAILY (Patient taking differently: TAKE 2 TABLETS TWICE DAILY)  . levothyroxine (SYNTHROID, LEVOTHROID) 50 MCG tablet Take 1 tablet (50 mcg total) by mouth daily.  Marland Kitchen LORazepam (ATIVAN) 2 MG tablet Take 1 tablet  (2 mg total) by mouth every 6 (six) hours as needed for anxiety.  Marland Kitchen losartan (COZAAR) 100 MG tablet TAKE 1 TABLET BY MOUTH ONCE DAILY (Patient taking differently: TAKE 100 MG BY MOUTH ONCE DAILY)  . metoprolol succinate (TOPROL-XL) 50 MG 24 hr tablet TAKE TWO TABLETS BY MOUTH IN THE MORNING TAKE WITH OR IMMEDIATELY FOLLOWING A MEAL (Patient taking differently: TAKE 100 MG BY MOUTH IN THE MORNING TAKE WITH OR IMMEDIATELY FOLLOWING A MEAL)  . OXYGEN Inhale 3 L into the lungs continuous.   . potassium chloride SA (K-DUR,KLOR-CON) 20 MEQ tablet Take 1 tablet (20 mEq total) by mouth daily.  . QUEtiapine (SEROQUEL) 50 MG tablet Take 100 mg by mouth at bedtime.   Marland Kitchen zolpidem (AMBIEN) 5 MG tablet Take 1 tablet (5 mg total) by mouth at bedtime. (Patient taking differently: Take 10 mg by mouth at bedtime. )     Allergies:   Patient has no known allergies.   Social History   Social History  . Marital status: Single    Spouse name: N/A  . Number of children: N/A  . Years of education: N/A   Occupational History  . disabled- Occupational hygienist Med    Social History Main Topics  . Smoking status: Former Smoker    Packs/day: 1.00    Years: 40.00    Types: Cigarettes    Quit date: 10/16/2011  .  Smokeless tobacco: Never Used  . Alcohol use Yes     Comment: Occ-- Wine  . Drug use: No  . Sexual activity: Not Asked   Other Topics Concern  . None   Social History Narrative  . None     Family History: The patient's family history includes Bipolar disorder in her father; Depression in her father; Diabetes in her sister; Heart disease in her brother, father, paternal aunt, paternal grandmother, and paternal uncle; Heart disease (age of onset: 59) in her sister; Hyperlipidemia in her sister; Hypertension in her brother, father, and sister; Schizophrenia in her paternal aunt. Father had heart issues, died in 54's. MI.  ROS:   Please see the history of present illness.   Positive for leg swelling, shortness  of breath, skipped heartbeats, wheezing, weight gain  All other systems reviewed and are negative.  EKGs/Labs/Other Studies Reviewed:    The following studies were reviewed today: Prior office notes, lab work, EKG, chest x-ray, prior echocardiogram reviewed  EKG:  EKG is not ordered today.  03/25/17-sinus rhythm, PVC noted, nonpathologic Q waves noted inferiorly, poor R-wave progression noted. Personally viewed, no significant change from prior ECG on 12/15/15.  Recent Labs: 02/27/2017: TSH 4.31 03/25/2017: B Natriuretic Peptide 15.3 03/31/2017: ALT 10; BUN 15; Creatinine, Ser 1.16; Hemoglobin 13.3; Platelets 183.0; Potassium 3.5; Sodium 139  Recent Lipid Panel    Component Value Date/Time   CHOL 126 02/27/2017 1620   TRIG 77.0 02/27/2017 1620   HDL 30.30 (L) 02/27/2017 1620   CHOLHDL 4 02/27/2017 1620   VLDL 15.4 02/27/2017 1620   LDLCALC 81 02/27/2017 1620    Physical Exam:    VS:  BP 126/80   Pulse 80   Ht 5\' 3"  (1.6 m)   Wt 297 lb (134.7 kg)   SpO2 96%   BMI 52.61 kg/m     Wt Readings from Last 3 Encounters:  04/04/17 297 lb (134.7 kg)  03/31/17 294 lb 12.8 oz (133.7 kg)  02/27/17 297 lb (134.7 kg)     GEN:  Obese, Well nourished, well developed in no acute distress HEENT: Normal NECK: No JVD; No carotid bruits LYMPHATICS: No lymphadenopathy CARDIAC: RRR, no murmurs, rubs, gallops RESPIRATORY:  Clear to auscultation without rales, wheezing or rhonchi  ABDOMEN: Soft, non-tender, non-distended MUSCULOSKELETAL:  trace edema; No deformity  SKIN: Warm and dry NEUROLOGIC:  Alert and oriented x 3 PSYCHIATRIC:  Normal affect   ASSESSMENT:    1. Dyspnea on exertion   2. Morbid obesity ([place])   3. Other emphysema ([place])    PLAN:    In order of problems listed above:  Dyspnea on exertion  - We will check an echocardiogram to ensure proper structure and function of her heart. Previously she has had mild diastolic dysfunction, grade 1. We will focus attention on right  ventricular size and function as well. Cardiomegaly was noted on chest x-ray recently.  - Continue with Lasix daily with potassium supplementation given her prior hypokalemia.  - Agree with current drug regimen, medications reviewed.  Morbid obesity  - Continue to encourage weight loss. She is working hard on this.  - This will help significant with her symptoms.  COPD  - Per main team.   - wears O2.  Medication Adjustments/Labs and Tests Ordered: Current medicines are reviewed at length with the patient today.  Concerns regarding medicines are outlined above.  No orders of the defined types were placed in this encounter.  No orders of the defined types  were placed in this encounter.   Signed, Candee Furbish, MD  04/04/2017 12:16 PM    [facility]

## 2017-04-06 ENCOUNTER — Encounter: Payer: Self-pay | Admitting: Pulmonary Disease

## 2017-04-06 ENCOUNTER — Other Ambulatory Visit: Payer: Medicare HMO

## 2017-04-06 ENCOUNTER — Ambulatory Visit (INDEPENDENT_AMBULATORY_CARE_PROVIDER_SITE_OTHER): Payer: Medicare HMO | Admitting: Pulmonary Disease

## 2017-04-06 VITALS — BP 120/60 | HR 88 | Ht 63.0 in | Wt 296.8 lb

## 2017-04-06 DIAGNOSIS — K219 Gastro-esophageal reflux disease without esophagitis: Secondary | ICD-10-CM | POA: Diagnosis not present

## 2017-04-06 DIAGNOSIS — Z23 Encounter for immunization: Secondary | ICD-10-CM | POA: Diagnosis not present

## 2017-04-06 DIAGNOSIS — J439 Emphysema, unspecified: Secondary | ICD-10-CM

## 2017-04-06 DIAGNOSIS — R0683 Snoring: Secondary | ICD-10-CM

## 2017-04-06 DIAGNOSIS — J302 Other seasonal allergic rhinitis: Secondary | ICD-10-CM

## 2017-04-06 DIAGNOSIS — J9611 Chronic respiratory failure with hypoxia: Secondary | ICD-10-CM

## 2017-04-06 MED ORDER — TIOTROPIUM BROMIDE MONOHYDRATE 2.5 MCG/ACT IN AERS: 2.0000 | INHALATION_SPRAY | Freq: Every day | RESPIRATORY_TRACT | 0 refills | Status: DC

## 2017-04-06 MED ORDER — RANITIDINE HCL 150 MG PO TABS: 150.0000 mg | ORAL_TABLET | Freq: Every day | ORAL | 3 refills | Status: DC

## 2017-04-06 MED ORDER — TIOTROPIUM BROMIDE MONOHYDRATE 2.5 MCG/ACT IN AERS: 2.0000 | INHALATION_SPRAY | Freq: Every day | RESPIRATORY_TRACT | 3 refills | Status: DC

## 2017-04-06 NOTE — Patient Instructions (Addendum)
   Continue using your Albuterol inhaler and nebulizer as needed.  I'm starting you on Spiriva to help your lung work better. Let me know if you have any problems with the medication.  We need to make sure we are suppressing your heartburn/reflux. Let me know if the Zantac doesn't completely control it.  Keep using your oxygen at 3 L/m.  Call or e-mail me if you have any questions or concerns.   TESTS ORDERED: 1. Full PFTs before next appointment 2. Serum Alpha-1 Antitrypsin Phenotype & RAST Panel today  3. Split night sleep study

## 2017-04-06 NOTE — Progress Notes (Signed)
Subjective:    Patient ID: Sue [name], female    DOB: 04-Dec-1950, 66 y.o.   MRN: [id number]  HPI Patient last seen in our office in March 2015 by Dr. Melvyn Novas. She was seen post hospitalization with acute hypoxic respiratory failure and prescribed oxygen at 2 L/m. Patient was also prescribed DuoNeb for emphysema appreciated on chest imaging. She has transitioned to an albuterol inhaler and Albuterol in her nebulizer which seem to help with control of her dyspnea. She reports she was seen recently in local ED with shortness of breath, chest pain, near syncope, and increased lower extremity edema. These symptoms seemed to progress gradually over the course of week. She was already on Lasix at that time. Her dose was increased to 40mg  bid from 60mg  once daily. She does feel her dyspnea has improved slightly. She still has significant dyspnea and is still unable to vacuum her home continuously, only able to do 1/2 or 1 room at a time. She reports her lower extremity edema has improved. She reports now she has no chest tightness or pressure. She denies any audible wheezing. She reports she has a "tiny" cough that is nonproductive. No breathing problems or asthma as a child. She reports when she moved to [facility] in her 73s she was hospitalized frequently requiring breathing treatments without a mentioned diagnosis of asthma. She reports she has had bronchitis on a yearly basis over the last 3 years. No history of pneumonia. She reports mild, seasonal allergic rhinitis with congestion & drainage. She reports frequent reflux even into the back of her throat at times. Previously was on Nexium but is taking Tums daily for her dyspepsia. She does have a history of snoring. She reports poor quality of sleep at night and gets up frequently sleeping 5-6 hours at time. She does use Ambien. She does have morning headaches frequently. She does nap occasionally. She does doze off easily.   Review of Systems No fever or  sweats. Questionable chills preceding her symptoms. She did previously have nausea and myalgias. The myalgias have resolved. No rashes or bruising. A pertinent 14 point review of systems is negative except as per the history of presenting illness.  No Known Allergies  Current Outpatient Prescriptions on File Prior to Visit  Medication Sig Dispense Refill  . albuterol (PROVENTIL) (2.5 MG/3ML) 0.083% nebulizer solution Take 3 mLs (2.5 mg total) by nebulization every 6 (six) hours as needed for wheezing or shortness of breath. 150 mL 1  . fluticasone (FLONASE) 50 MCG/ACT nasal spray Place 2 sprays into both nostrils daily. (Patient taking differently: Place 2 sprays into both nostrils daily as needed for allergies. ) 16 g 6  . furosemide (LASIX) 20 MG tablet TAKE THREE TABLETS BY MOUTH ONCE DAILY (Patient taking differently: TAKE 2 TABLETS TWICE DAILY) 270 tablet 1  . levothyroxine (SYNTHROID, LEVOTHROID) 50 MCG tablet Take 1 tablet (50 mcg total) by mouth daily. 30 tablet 11  . LORazepam (ATIVAN) 2 MG tablet Take 1 tablet (2 mg total) by mouth every 6 (six) hours as needed for anxiety. 30 tablet 0  . losartan (COZAAR) 100 MG tablet TAKE 1 TABLET BY MOUTH ONCE DAILY (Patient taking differently: TAKE 100 MG BY MOUTH ONCE DAILY) 90 tablet 0  . metoprolol succinate (TOPROL-XL) 50 MG 24 hr tablet TAKE TWO TABLETS BY MOUTH IN THE MORNING TAKE WITH OR IMMEDIATELY FOLLOWING A MEAL (Patient taking differently: TAKE 100 MG BY MOUTH IN THE MORNING TAKE WITH OR IMMEDIATELY FOLLOWING A  MEAL) 60 tablet 5  . OXYGEN Inhale 3 L into the lungs continuous.     . potassium chloride SA (K-DUR,KLOR-CON) 20 MEQ tablet Take 1 tablet (20 mEq total) by mouth daily. 30 tablet 2  . QUEtiapine (SEROQUEL) 50 MG tablet Take 100 mg by mouth at bedtime.   3  . zolpidem (AMBIEN) 5 MG tablet Take 1 tablet (5 mg total) by mouth at bedtime. (Patient taking differently: Take 10 mg by mouth at bedtime. ) 30 tablet 0   No current  facility-administered medications on file prior to visit.     Past Medical History:  Diagnosis Date  . Allergic rhinitis   . Depression   . Emphysema of lung ([place])   . GERD (gastroesophageal reflux disease)   . Hypertension   . Hypothyroidism   . Stroke [facility]) 2016   TIA   . Urine incontinence     Past Surgical History:  Procedure Laterality Date  . ABDOMINAL HYSTERECTOMY    . CESAREAN SECTION      Family History  Problem Relation Age of Onset  . Heart disease Father        MVP and Pics Valve  . Hypertension Father   . Depression Father        Institutionalized x's 2 years  . Bipolar disorder Father   . Hypertension Sister   . Diabetes Sister   . Hyperlipidemia Sister   . Heart disease Sister 54       MI  . Heart disease Brother   . Hypertension Brother   . Heart disease Paternal Grandmother   . Heart disease Paternal Aunt   . Heart disease Paternal Uncle   . Schizophrenia Paternal Aunt   . Asthma Son   . Asthma Son     Social History   Social History  . Marital status: Single    Spouse name: N/A  . Number of children: N/A  . Years of education: N/A   Occupational History  . disabled- Occupational hygienist Med    Social History Main Topics  . Smoking status: Former Smoker    Packs/day: 1.00    Years: 40.00    Types: Cigarettes    Start date: 07/21/1972    Quit date: 10/16/2011  . Smokeless tobacco: Never Used     Comment: stopped for around 9 months during 1 pregnancy.  . Alcohol use Yes     Comment: Occ-- Wine  . Drug use: No  . Sexual activity: Not Asked   Other Topics Concern  . None   Social History Narrative   [facility] (04/06/17):   Originally from [place]. Has also lived in [place], [place]. She also previously lived in [place] for 20 years. No history of Valley Fever. Moved to [place] in 1989. 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 No pets currently. No bird exposure. Questionable previous mold exposure in her daughter's home. 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       Objective:   Physical Exam BP 120/60 (BP Location: Left Arm, Patient Position: Sitting, Cuff Size: Large)   Pulse 88   Ht 5\' 3"  (1.6 m)   Wt 296 lb 12.8 oz (134.6 kg)   SpO2 92% Comment: On room air; walked from scale to room 9A.  BMI 52.58 kg/m  General:  Awake. Alert. No acute distress. Morbidly obese. Accompanied by granddaughter today. Integument:  Warm & dry. No rash on exposed skin. No bruising on exposed skin. Extremities:  No cyanosis or clubbing.  Lymphatics:  No appreciated cervical or supraclavicular lymphadenoapthy. HEENT:  Moist mucus membranes. No oral ulcers. No scleral injection or icterus. Minimal nasal turbinate swelling Mallampati Class 3 & neck circumference 16 inches. Cardiovascular:  Regular rate and rhythm. Pitting edema up to the knees. Unable to appreciate JVD given body habitus. Pulmonary:  Diminished breath sounds bilateral lung bases. Otherwise clear with auscultation. Symmetric chest wall expansion. No accessory muscle use on supplemental oxygen. Abdomen: Soft. Normal bowel sounds. Protuberant. Grossly nontender. Musculoskeletal:  Normal bulk and tone. Hand grip strength 5/5 bilaterally. No joint deformity or effusion appreciated. Neurological:  CN 2-12 grossly in tact. No meningismus. Moving all 4 extremities equally.  Psychiatric:  Mood and affect congruent. Speech normal rhythm, rate & tone.   PFT 11/29/13: FVC 2.30 L (95%) FEV1 1.67 L (88%) FEV1/FVC 0.72 FEF 25-75 1.11 L (88%) negative bronchodilator response TLC 4.14 L (87%) RV 95% ERV 73% DLCO uncorrected 62%  6MWT 04/06/17:  Walked 2 laps / Baseline Sat 98% on RA / Nadir Sat 87% on RA @ 1 lap (required 3 L/m pulse to maintain)  EPWORTH SLEEPINESS SCALE (04/06/17):   10  IMAGING CXR PA/LAT 03/31/17 (personally reviewed by me):  No rectal masses or opacity appreciated. No pleural effusion. Heart normal in size & mediastinum normal in contour.  CTA CHEST 11/06/13 (personally reviewed by me):  No pulmonary embolism. Emphysema seems to be fairly uniform throughout both lungs with some cysts noted in the bases. Dependent subpleural reticulation. No obvious intralobular septal thickening or bronchiectasis. No parenchymal opacity, nodule, or mass appreciated. No pleural effusion or thickening appreciated. No pericardial effusion. No pathologic mediastinal adenopathy.  CARDIAC TTE (07/30/14): LV cavity size normal with EF 55-60%. No regional wall motion abnormalities & grade 1 diastolic dysfunction. LA & RA normal in size. RV normal in size and function. No aortic stenosis or regurgitation. Aortic root normal in size. No mitral stenosis or regurgitation. No pulmonic stenosis with poorly visualized about. No tricuspid regurgitation. No pericardial effusion.    Assessment & Plan:  66 y.o. female with known history of tobacco use as well as pulmonary emphysema by my review of her previous CT scan in 2015. Certainly with patient's previous echocardiogram and description of symptoms above decompensation from diastolic congestive heart failure is plausible. However, her serum testing would not support this. It is conceivable that her edema could be due to decompensated right ventricular heart failure and given her snoring, obesity, and physical exam findings above sleep apnea is quite probable. We will need to perform testing to confirm sleep apnea and I am starting Spiriva for treatment of her underlying pulmonary emphysema to improve ventilation and perfusion matching. Additionally, she may have some element of an asthmatic phenotype given her symptoms started at a young age and after initiating tobacco use. I instructed the patient to contact my office if she had any new breathing  problems or questions before her next appointment.  1. Pulmonary emphysema:  Screening for alpha-1 antitrypsin deficiency. Checking full pulmonary function testing before next appointment. Starting patient on Spiriva Respimat. 2. Chronic hypoxic respiratory failure:  Continuing patient on oxygen therapy at 3 L/m pulse dose. 3. Snoring:  High probability for sleep apnea. Checking split-night sleep study. 4. GERD:  Starting Zantac 150 milligrams  by mouth daily at bedtime. Patient counseled on appropriate dietary and lifestyle modification. 5. Chronic seasonal allergic rhinitis: Checking serum RAST panel. 6. Health maintenance: Status post Pneumovax January 2014 & Tdap January 2011. Administering Prevnar vaccine today. 7. Follow-up: Return to clinic in 2 months or sooner if needed.  Sonia Baller Ashok Cordia, M.D. [facility] Pager:  [phone] After 3pm or if no response, call [phone] 12:31 PM 04/06/17

## 2017-04-06 NOTE — Addendum Note (Signed)
Addended by: Tyson Dense on: 04/06/2017 02:12 PM   Modules accepted: Orders

## 2017-04-06 NOTE — Progress Notes (Signed)
Patient seen in the office today and instructed on use of Spiriva 2.5.  Patient expressed understanding and demonstrated technique. 

## 2017-04-07 LAB — RESPIRATORY ALLERGY PROFILE REGION II ~~LOC~~
Allergen, C. Herbarum, M2: <0.1 kU/L
Allergen, Cottonwood, t14: <0.1 kU/L
Allergen, D pternoyssinus,d7: <0.1 kU/L
Allergen, Mulberry, t76: <0.1 kU/L
Allergen, Oak,t7: <0.1 kU/L
Bermuda Grass: <0.1 kU/L
Cat Dander: <0.1 kU/L
Cockroach: <0.1 kU/L
D. farinae: <0.1 kU/L
Dog Dander: <0.1 kU/L
Elm IgE: <0.1 kU/L
IGE (IMMUNOGLOBULIN E), SERUM: 26 kU/L (ref <115)
Johnson Grass: <0.1 kU/L
TIMOTHY GRASS: 0.31 kU/L — AB

## 2017-04-10 ENCOUNTER — Ambulatory Visit (HOSPITAL_COMMUNITY): Payer: Medicare HMO | Attending: Internal Medicine

## 2017-04-10 ENCOUNTER — Other Ambulatory Visit: Payer: Self-pay

## 2017-04-10 DIAGNOSIS — I5030 Unspecified diastolic (congestive) heart failure: Secondary | ICD-10-CM | POA: Insufficient documentation

## 2017-04-10 DIAGNOSIS — R0602 Shortness of breath: Secondary | ICD-10-CM | POA: Diagnosis not present

## 2017-04-10 LAB — ALPHA-1 ANTITRYPSIN PHENOTYPE: A1 ANTITRYPSIN: 176 mg/dL (ref 83–199)

## 2017-04-19 ENCOUNTER — Ambulatory Visit (HOSPITAL_BASED_OUTPATIENT_CLINIC_OR_DEPARTMENT_OTHER): Payer: Medicare HMO

## 2017-05-01 ENCOUNTER — Observation Stay (HOSPITAL_COMMUNITY)
Admission: EM | Admit: 2017-05-01 | Discharge: 2017-05-02 | Disposition: A | Discharge status: Home / Self Care | Payer: Medicare HMO | Attending: Family Medicine | Admitting: Family Medicine

## 2017-05-01 ENCOUNTER — Emergency Department (HOSPITAL_COMMUNITY): Payer: Medicare HMO

## 2017-05-01 ENCOUNTER — Telehealth: Payer: Self-pay | Admitting: Family Medicine

## 2017-05-01 ENCOUNTER — Encounter (HOSPITAL_COMMUNITY): Payer: Self-pay

## 2017-05-01 DIAGNOSIS — F418 Other specified anxiety disorders: Secondary | ICD-10-CM | POA: Diagnosis not present

## 2017-05-01 DIAGNOSIS — J301 Allergic rhinitis due to pollen: Secondary | ICD-10-CM | POA: Diagnosis not present

## 2017-05-01 DIAGNOSIS — I6789 Other cerebrovascular disease: Secondary | ICD-10-CM | POA: Diagnosis not present

## 2017-05-01 DIAGNOSIS — I519 Heart disease, unspecified: Secondary | ICD-10-CM | POA: Diagnosis present

## 2017-05-01 DIAGNOSIS — Z8673 Personal history of transient ischemic attack (TIA), and cerebral infarction without residual deficits: Secondary | ICD-10-CM | POA: Insufficient documentation

## 2017-05-01 DIAGNOSIS — E039 Hypothyroidism, unspecified: Secondary | ICD-10-CM | POA: Diagnosis not present

## 2017-05-01 DIAGNOSIS — K219 Gastro-esophageal reflux disease without esophagitis: Secondary | ICD-10-CM | POA: Diagnosis not present

## 2017-05-01 DIAGNOSIS — Z79899 Other long term (current) drug therapy: Secondary | ICD-10-CM | POA: Diagnosis not present

## 2017-05-01 DIAGNOSIS — I509 Heart failure, unspecified: Secondary | ICD-10-CM | POA: Diagnosis not present

## 2017-05-01 DIAGNOSIS — B182 Chronic viral hepatitis C: Secondary | ICD-10-CM | POA: Insufficient documentation

## 2017-05-01 DIAGNOSIS — Z9981 Dependence on supplemental oxygen: Secondary | ICD-10-CM | POA: Insufficient documentation

## 2017-05-01 DIAGNOSIS — Z6830 Body mass index (BMI) 30.0-30.9, adult: Secondary | ICD-10-CM | POA: Diagnosis not present

## 2017-05-01 DIAGNOSIS — R0789 Other chest pain: Secondary | ICD-10-CM | POA: Diagnosis not present

## 2017-05-01 DIAGNOSIS — J439 Emphysema, unspecified: Secondary | ICD-10-CM | POA: Diagnosis present

## 2017-05-01 DIAGNOSIS — R200 Anesthesia of skin: Secondary | ICD-10-CM | POA: Diagnosis not present

## 2017-05-01 DIAGNOSIS — E876 Hypokalemia: Secondary | ICD-10-CM | POA: Diagnosis not present

## 2017-05-01 DIAGNOSIS — I10 Essential (primary) hypertension: Secondary | ICD-10-CM | POA: Diagnosis present

## 2017-05-01 DIAGNOSIS — G4700 Insomnia, unspecified: Secondary | ICD-10-CM

## 2017-05-01 DIAGNOSIS — R609 Edema, unspecified: Secondary | ICD-10-CM | POA: Diagnosis not present

## 2017-05-01 DIAGNOSIS — E669 Obesity, unspecified: Secondary | ICD-10-CM | POA: Diagnosis not present

## 2017-05-01 DIAGNOSIS — I110 Hypertensive heart disease with heart failure: Secondary | ICD-10-CM | POA: Insufficient documentation

## 2017-05-01 DIAGNOSIS — J441 Chronic obstructive pulmonary disease with (acute) exacerbation: Secondary | ICD-10-CM | POA: Insufficient documentation

## 2017-05-01 DIAGNOSIS — Z87891 Personal history of nicotine dependence: Secondary | ICD-10-CM | POA: Diagnosis not present

## 2017-05-01 DIAGNOSIS — R079 Chest pain, unspecified: Secondary | ICD-10-CM | POA: Diagnosis present

## 2017-05-01 LAB — COMPREHENSIVE METABOLIC PANEL
ALT: 14 U/L (ref 14–54)
ANION GAP: 8 (ref 5–15)
AST: 30 U/L (ref 15–41)
Albumin: 3.3 g/dL — ABNORMAL LOW (ref 3.5–5.0)
Alkaline Phosphatase: 111 U/L (ref 38–126)
BILIRUBIN TOTAL: 1 mg/dL (ref 0.3–1.2)
BUN: 13 mg/dL (ref 6–20)
CO2: 25 mmol/L (ref 22–32)
Calcium: 8.8 mg/dL — ABNORMAL LOW (ref 8.9–10.3)
Chloride: 104 mmol/L (ref 101–111)
Creatinine, Ser: 1.1 mg/dL — ABNORMAL HIGH (ref 0.44–1.00)
GFR, EST NON AFRICAN AMERICAN: 52 mL/min — AB (ref >60)
Glucose, Bld: 118 mg/dL — ABNORMAL HIGH (ref 65–99)
POTASSIUM: 3.8 mmol/L (ref 3.5–5.1)
Sodium: 137 mmol/L (ref 135–145)
TOTAL PROTEIN: 8.6 g/dL — AB (ref 6.5–8.1)

## 2017-05-01 LAB — DIFFERENTIAL
Basophils Absolute: 0 10*3/uL (ref 0.0–0.1)
Basophils Relative: 1 %
EOS ABS: 0.2 10*3/uL (ref 0.0–0.7)
EOS PCT: 3 %
LYMPHS ABS: 3.7 10*3/uL (ref 0.7–4.0)
Lymphocytes Relative: 55 %
MONO ABS: 0.4 10*3/uL (ref 0.1–1.0)
MONOS PCT: 6 %
Neutro Abs: 2.3 10*3/uL (ref 1.7–7.7)
Neutrophils Relative %: 35 %

## 2017-05-01 LAB — CBC
HEMATOCRIT: 40.3 % (ref 36.0–46.0)
HEMOGLOBIN: 13 g/dL (ref 12.0–15.0)
MCH: 30.5 pg (ref 26.0–34.0)
MCHC: 32.3 g/dL (ref 30.0–36.0)
MCV: 94.6 fL (ref 78.0–100.0)
Platelets: 171 10*3/uL (ref 150–400)
RBC: 4.26 MIL/uL (ref 3.87–5.11)
RDW: 13.8 % (ref 11.5–15.5)
WBC: 6.6 10*3/uL (ref 4.0–10.5)

## 2017-05-01 LAB — PROTIME-INR
INR: 1.1
Prothrombin Time: 14.2 seconds (ref 11.4–15.2)

## 2017-05-01 LAB — TROPONIN I: Troponin I: <0.03 ng/mL (ref <0.03)

## 2017-05-01 LAB — APTT: aPTT: 29 seconds (ref 24–36)

## 2017-05-01 LAB — BRAIN NATRIURETIC PEPTIDE: B NATRIURETIC PEPTIDE 5: 10.4 pg/mL (ref 0.0–100.0)

## 2017-05-01 LAB — I-STAT TROPONIN, ED: TROPONIN I, POC: 0 ng/mL (ref 0.00–0.08)

## 2017-05-01 MED ORDER — FAMOTIDINE 20 MG PO TABS
20.0000 mg | ORAL_TABLET | Freq: Every day | ORAL | Status: DC
  Administered 2017-05-02: 20 mg via ORAL
  Filled 2017-05-01: qty 1

## 2017-05-01 MED ORDER — HEPARIN SODIUM (PORCINE) 5000 UNIT/ML IJ SOLN
5000.0000 [IU] | Freq: Three times a day (TID) | INTRAMUSCULAR | Status: DC
Start: 2017-05-01 — End: 2017-05-02
  Administered 2017-05-01 – 2017-05-02 (×2): 5000 [IU] via SUBCUTANEOUS
  Filled 2017-05-01 (×2): qty 1

## 2017-05-01 MED ORDER — FUROSEMIDE 20 MG PO TABS
80.0000 mg | ORAL_TABLET | Freq: Every day | ORAL | Status: DC
  Administered 2017-05-02: 80 mg via ORAL
  Filled 2017-05-01: qty 4

## 2017-05-01 MED ORDER — METOPROLOL SUCCINATE ER 100 MG PO TB24
100.0000 mg | ORAL_TABLET | Freq: Every day | ORAL | Status: DC
  Administered 2017-05-01 – 2017-05-02 (×2): 100 mg via ORAL
  Filled 2017-05-01 (×2): qty 1

## 2017-05-01 MED ORDER — LORAZEPAM 1 MG PO TABS
2.0000 mg | ORAL_TABLET | Freq: Four times a day (QID) | ORAL | Status: DC | PRN
  Administered 2017-05-01: 2 mg via ORAL
  Filled 2017-05-01: qty 2

## 2017-05-01 MED ORDER — FUROSEMIDE 10 MG/ML IJ SOLN
80.0000 mg | Freq: Once | INTRAMUSCULAR | Status: AC
  Administered 2017-05-01: 80 mg via INTRAVENOUS
  Filled 2017-05-01: qty 8

## 2017-05-01 MED ORDER — ONDANSETRON HCL 4 MG PO TABS: 4.0000 mg | ORAL_TABLET | Freq: Four times a day (QID) | ORAL | Status: DC | PRN

## 2017-05-01 MED ORDER — SODIUM CHLORIDE 0.9 % IV SOLN: 250.0000 mL | INTRAVENOUS | Status: DC | PRN

## 2017-05-01 MED ORDER — HEPARIN SODIUM (PORCINE) 5000 UNIT/ML IJ SOLN: 5000.0000 [IU] | Freq: Three times a day (TID) | INTRAMUSCULAR | Status: DC

## 2017-05-01 MED ORDER — TIOTROPIUM BROMIDE MONOHYDRATE 18 MCG IN CAPS
18.0000 ug | ORAL_CAPSULE | Freq: Every day | RESPIRATORY_TRACT | Status: DC
  Administered 2017-05-02: 18 ug via RESPIRATORY_TRACT
  Filled 2017-05-01: qty 5

## 2017-05-01 MED ORDER — ONDANSETRON HCL 4 MG/2ML IJ SOLN: 4.0000 mg | Freq: Four times a day (QID) | INTRAMUSCULAR | Status: DC | PRN

## 2017-05-01 MED ORDER — SODIUM CHLORIDE 0.9% FLUSH: 3.0000 mL | INTRAVENOUS | Status: DC | PRN

## 2017-05-01 MED ORDER — ACETAMINOPHEN 650 MG RE SUPP: 650.0000 mg | Freq: Four times a day (QID) | RECTAL | Status: DC | PRN

## 2017-05-01 MED ORDER — ASPIRIN 81 MG PO CHEW
324.0000 mg | CHEWABLE_TABLET | Freq: Once | ORAL | Status: AC
  Administered 2017-05-01: 324 mg via ORAL
  Filled 2017-05-01: qty 4

## 2017-05-01 MED ORDER — ZOLPIDEM TARTRATE 5 MG PO TABS
5.0000 mg | ORAL_TABLET | Freq: Every day | ORAL | Status: DC
  Administered 2017-05-01: 5 mg via ORAL
  Filled 2017-05-01: qty 1

## 2017-05-01 MED ORDER — LEVOTHYROXINE SODIUM 50 MCG PO TABS
50.0000 ug | ORAL_TABLET | Freq: Every day | ORAL | Status: DC
  Administered 2017-05-02: 50 ug via ORAL
  Filled 2017-05-01: qty 1

## 2017-05-01 MED ORDER — POTASSIUM CHLORIDE CRYS ER 20 MEQ PO TBCR
20.0000 meq | EXTENDED_RELEASE_TABLET | Freq: Every day | ORAL | Status: DC
  Administered 2017-05-01 – 2017-05-02 (×2): 20 meq via ORAL
  Filled 2017-05-01 (×2): qty 1

## 2017-05-01 MED ORDER — QUETIAPINE FUMARATE 100 MG PO TABS
100.0000 mg | ORAL_TABLET | Freq: Every day | ORAL | Status: DC
Start: 2017-05-01 — End: 2017-05-02
  Administered 2017-05-01: 100 mg via ORAL
  Filled 2017-05-01: qty 1

## 2017-05-01 MED ORDER — ACETAMINOPHEN 325 MG PO TABS: 650.0000 mg | ORAL_TABLET | Freq: Four times a day (QID) | ORAL | Status: DC | PRN

## 2017-05-01 MED ORDER — LOSARTAN POTASSIUM 50 MG PO TABS
100.0000 mg | ORAL_TABLET | Freq: Every day | ORAL | Status: DC
  Administered 2017-05-01 – 2017-05-02 (×2): 100 mg via ORAL
  Filled 2017-05-01 (×2): qty 2

## 2017-05-01 MED ORDER — SODIUM CHLORIDE 0.9% FLUSH
3.0000 mL | Freq: Two times a day (BID) | INTRAVENOUS | Status: DC
  Administered 2017-05-01: 3 mL via INTRAVENOUS

## 2017-05-01 MED ORDER — GI COCKTAIL ~~LOC~~: 30.0000 mL | Freq: Four times a day (QID) | ORAL | Status: DC | PRN

## 2017-05-01 MED ORDER — ALBUTEROL SULFATE (2.5 MG/3ML) 0.083% IN NEBU: 2.5000 mg | INHALATION_SOLUTION | Freq: Four times a day (QID) | RESPIRATORY_TRACT | Status: DC | PRN

## 2017-05-01 MED ORDER — ASPIRIN EC 81 MG PO TBEC
81.0000 mg | DELAYED_RELEASE_TABLET | Freq: Every day | ORAL | Status: DC
  Administered 2017-05-02: 81 mg via ORAL
  Filled 2017-05-01: qty 1

## 2017-05-01 NOTE — ED Notes (Signed)
Attempted report, Floor RN to call back

## 2017-05-01 NOTE — ED Triage Notes (Signed)
Pt presents via [facility] for evaluation of L sided arm and jaw numbness/tingling since Saturday night. Pt denies weakness to L side, no other focal neuro symptoms. Pt alert and oriented x 4.

## 2017-05-01 NOTE — ED Provider Notes (Signed)
Sue [name]   CSN: [id number] Arrival date & time: 05/01/17  1010     History   Chief Complaint Chief Complaint  Patient presents with  . Numbness    HPI Sue [name] is a 66 y.o. female.  HPI  Patient is 66 year old female presenting with numbness and pain to her left arm and left jaw. Patient has hypertension, obesity, emphysema, chronically O2 dependent and hypothyroidism. This jaw pain radiating to her left arm started and woke her from sleep. Also has a mild heaviness to her chest. Patient reports also numbness to the left arm no weakness. . Patient also reports that she's had some swelling in her bilateral legs. She is on Lasix 80 mg daily. She reports no dietary indiscretions.    Past Medical History:  Diagnosis Date  . Allergic rhinitis   . Depression   . Emphysema of lung ([place])   . GERD (gastroesophageal reflux disease)   . Hypertension   . Hypothyroidism   . Stroke [facility]) 2016   TIA   . Urine incontinence     Patient Active Problem List   Diagnosis Date Noted  . Atypical chest pain 05/01/2017  . Pulmonary emphysema ([place]) 04/06/2017  . Chronic seasonal allergic rhinitis 04/06/2017  . GERD (gastroesophageal reflux disease) 04/06/2017  . Snoring 04/06/2017  . Myalgia 02/01/2017  . Arthralgia 02/01/2017  . Chronic pain of toe of left foot 11/14/2016  . Chronic hepatitis C without hepatic coma ([place]) 08/01/2016  . Hypothyroidism 10/29/2015  . Mild diastolic dysfunction 66/44/0347  . Edema 01/01/2015  . Pedal edema 12/24/2014  . TIA (transient ischemic attack) 07/30/2014  . Weakness 07/29/2014  . Flank pain 11/12/2013  . Chronic respiratory failure ([place]) 10/25/2013  . Dyspnea 10/25/2013  . Obesity (BMI 30-39.9) 10/15/2013  . Depression with anxiety 10/15/2013  . Insomnia 10/15/2013  . HTN (hypertension) 10/09/2013  . Hypokalemia 10/09/2013    Past Surgical History:  Procedure Laterality Date  . ABDOMINAL HYSTERECTOMY    .  CESAREAN SECTION      OB History    No data available       Home Medications    Prior to Admission medications   Medication Sig Start Date End Date Taking? Authorizing Provider  albuterol (PROVENTIL HFA;VENTOLIN HFA) 108 (90 Base) MCG/ACT inhaler Inhale 1-2 puffs into the lungs every 6 (six) hours as needed for wheezing or shortness of breath.   Yes [provider]  albuterol (PROVENTIL) (2.5 MG/3ML) 0.083% nebulizer solution Take 3 mLs (2.5 mg total) by nebulization every 6 (six) hours as needed for wheezing or shortness of breath. 03/31/17  Yes Carollee Herter, Yvonne R, DO  fluticasone (FLONASE) 50 MCG/ACT nasal spray Place 2 sprays into both nostrils daily. Patient taking differently: Place 2 sprays into both nostrils daily as needed for allergies.  01/31/17  Yes Roma Schanz R, DO  furosemide (LASIX) 20 MG tablet TAKE THREE TABLETS BY MOUTH ONCE DAILY Patient taking differently: TAKE 4 TABLETS (80mg ) DAILY 02/27/17  Yes Ann Held, DO  levothyroxine (SYNTHROID, LEVOTHROID) 50 MCG tablet Take 1 tablet (50 mcg total) by mouth daily. 08/23/16  Yes Roma Schanz R, DO  LORazepam (ATIVAN) 2 MG tablet Take 1 tablet (2 mg total) by mouth every 6 (six) hours as needed for anxiety. 01/31/17  Yes Roma Schanz R, DO  losartan (COZAAR) 100 MG tablet TAKE 1 TABLET BY MOUTH ONCE DAILY Patient taking differently: TAKE 100 MG BY MOUTH ONCE DAILY 01/24/17  Yes Carollee Herter, Yvonne R, DO  metoprolol succinate (TOPROL-XL) 50 MG 24 hr tablet TAKE TWO TABLETS BY MOUTH IN THE MORNING TAKE WITH OR IMMEDIATELY FOLLOWING A MEAL Patient taking differently: TAKE 100 MG BY MOUTH IN THE MORNING TAKE WITH OR IMMEDIATELY FOLLOWING A MEAL 01/24/17  Yes Roma Schanz R, DO  OXYGEN Inhale 3 L into the lungs continuous.    Yes [provider]  potassium chloride SA (K-DUR,KLOR-CON) 20 MEQ tablet Take 1 tablet (20 mEq total) by mouth daily. 03/31/17  Yes Roma Schanz R,  DO  QUEtiapine (SEROQUEL) 50 MG tablet Take 100 mg by mouth at bedtime.  11/09/15  Yes [provider]  ranitidine (ZANTAC) 150 MG tablet Take 1 tablet (150 mg total) by mouth at bedtime. 04/06/17  Yes Javier Glazier, MD  Tiotropium Bromide Monohydrate (SPIRIVA RESPIMAT) 2.5 MCG/ACT AERS Inhale 2 puffs into the lungs daily. Patient taking differently: Inhale 2 puffs into the lungs daily as needed (SOB).  04/06/17  Yes Javier Glazier, MD  zolpidem (AMBIEN) 5 MG tablet Take 1 tablet (5 mg total) by mouth at bedtime. Patient taking differently: Take 10 mg by mouth at bedtime.  08/23/16  Yes Ann Held, DO    Family History Family History  Problem Relation Age of Onset  . Heart disease Father        MVP and Pics Valve  . Hypertension Father   . Depression Father        Institutionalized x's 2 years  . Bipolar disorder Father   . Hypertension Sister   . Diabetes Sister   . Hyperlipidemia Sister   . Heart disease Sister 1       MI  . Heart disease Brother   . Hypertension Brother   . Heart disease Paternal Grandmother   . Heart disease Paternal Aunt   . Heart disease Paternal Uncle   . Schizophrenia Paternal Aunt   . Asthma Son   . Asthma Son     Social History Social History  Substance Use Topics  . Smoking status: Former Smoker    Packs/day: 1.00    Years: 40.00    Types: Cigarettes    Start date: 07/21/1972    Quit date: 10/16/2011  . Smokeless tobacco: Never Used     Comment: stopped for around 9 months during 1 pregnancy.  . Alcohol use Yes     Comment: Occ-- Wine     Allergies   Patient has no known allergies.   Review of Systems Review of Systems  Constitutional: Negative for activity change and fatigue.  HENT: Negative for congestion and drooling.   Eyes: Negative for discharge.  Respiratory: Positive for chest tightness. Negative for cough.   Cardiovascular: Positive for chest pain and leg swelling.  Gastrointestinal: Negative for  abdominal distention.  Genitourinary: Negative for dysuria.  Musculoskeletal: Negative for joint swelling.  Skin: Negative for rash.  Neurological: Negative for seizures and speech difficulty.  Psychiatric/Behavioral: Negative for agitation and behavioral problems.  All other systems reviewed and are negative.    Physical Exam Updated Vital Signs BP (!) 104/57   Pulse 71   Temp 98.9 F (37.2 C) (Oral)   Resp 14   SpO2 97%   Physical Exam  Constitutional: She is oriented to person, place, and time. She appears well-developed and well-nourished.  On chronic O2.  HENT:  Head: Normocephalic and atraumatic.  Eyes: Right eye exhibits no discharge.  Cardiovascular: Normal rate, regular rhythm  and normal heart sounds.   No murmur heard. Pulmonary/Chest: Effort normal and breath sounds normal. She has no wheezes. She has no rales.  Abdominal: Soft. She exhibits no distension. There is no tenderness.  Musculoskeletal:  +2 edema to mid calf bilaterally.  Neurological: She is oriented to person, place, and time.  Skin: Skin is warm and dry. She is not diaphoretic.  Psychiatric: She has a normal mood and affect.  Nursing [name] and vitals reviewed.    ED Treatments / Results  Labs (all labs ordered are listed, but only abnormal results are displayed) Labs Reviewed  COMPREHENSIVE METABOLIC PANEL - Abnormal; Notable for the following:       Result Value   Glucose, Bld 118 (*)    Creatinine, Ser 1.10 (*)    Calcium 8.8 (*)    Total Protein 8.6 (*)    Albumin 3.3 (*)    GFR calc non Af Amer 52 (*)    All other components within normal limits  PROTIME-INR  APTT  CBC  DIFFERENTIAL  TROPONIN I  TROPONIN I  TROPONIN I  BRAIN NATRIURETIC PEPTIDE  I-STAT TROPONIN, ED    EKG  EKG Interpretation  Date/Time:  Monday May 01 2017 10:24:17 EDT Ventricular Rate:  76 PR Interval:    QRS Duration: 93 QT Interval:  411 QTC Calculation: 463 R Axis:   35 Text Interpretation:  No  significant change since last tracing ST flattening ni v2/v3 Confirmed by Thomasene Lot, [place] [id number]) on 05/01/2017 11:05:11 AM       Radiology Dg Chest 2 View  Result Date: 05/01/2017 CLINICAL DATA:  Left-sided arm and all numbness. EXAM: CHEST  2 VIEW COMPARISON:  Chest radiograph 03/31/2017 FINDINGS: Monitoring leads overlie the patient. Stable cardiac and mediastinal contours. No consolidative pulmonary opacities. No pleural effusion or pneumothorax. Midthoracic spine degenerative changes. IMPRESSION: No acute cardiopulmonary process. Electronically Signed   By: Lovey Newcomer M.D.   On: 05/01/2017 11:59    Procedures Procedures (including critical care time)  Medications Ordered in ED Medications  furosemide (LASIX) injection 80 mg (not administered)  famotidine (PEPCID) tablet 20 mg (not administered)  Tiotropium Bromide Monohydrate AERS 2 puff (not administered)  albuterol (PROVENTIL) (2.5 MG/3ML) 0.083% nebulizer solution 2.5 mg (not administered)  furosemide (LASIX) tablet 80 mg (not administered)  LORazepam (ATIVAN) tablet 2 mg (not administered)  levothyroxine (SYNTHROID, LEVOTHROID) tablet 50 mcg (not administered)  zolpidem (AMBIEN) tablet 10 mg (not administered)  QUEtiapine (SEROQUEL) tablet 100 mg (not administered)  metoprolol succinate (TOPROL-XL) 24 hr tablet 100 mg (not administered)  losartan (COZAAR) tablet 100 mg (not administered)  heparin injection 5,000 Units (not administered)  sodium chloride flush (NS) 0.9 % injection 3 mL (not administered)  sodium chloride flush (NS) 0.9 % injection 3 mL (not administered)  0.9 %  sodium chloride infusion (not administered)  acetaminophen (TYLENOL) tablet 650 mg (not administered)    Or  acetaminophen (TYLENOL) suppository 650 mg (not administered)  ondansetron (ZOFRAN) tablet 4 mg (not administered)    Or  ondansetron (ZOFRAN) injection 4 mg (not administered)  heparin injection 5,000 Units (not administered)  gi  cocktail (Maalox,Lidocaine,Donnatal) (not administered)  aspirin EC tablet 81 mg (not administered)  furosemide (LASIX) injection 80 mg (80 mg Intravenous Given 05/01/17 1158)  aspirin chewable tablet 324 mg (324 mg Oral Given 05/01/17 1158)     Initial Impression / Assessment and Plan / ED Course  I have reviewed the triage vital signs and the nursing notes.  Pertinent  labs & imaging results that were available during my care of the patient were reviewed by me and considered in my medical decision making (see chart for details).     Patient is 66 year old female presenting with numbness and pain to her left arm and left jaw. Patient has hypertension, obesity, emphysema, chronically O2 dependent and hypothyroidism. This jaw pain radiating to her left arm started and woke her from sleep. Also has a mild heaviness to her chest. Patient reports also numbness to the left arm no weakness. . Patient also reports that she's had some swelling in her bilateral legs. She is on Lasix 80 mg daily. She reports no dietary indiscretions.  Patient had recent echo showed normal LV function. However given risk factors and chest pain equivalent in this female will admit for serial troponins.  Patient appears mildly fluid overloaded at this time. Give her IV dose of her home Lasix.  HEART score 4.   Final Clinical Impressions(s) / ED Diagnoses   Final diagnoses:  None    New Prescriptions New Prescriptions   No medications on file     Macarthur Critchley, MD 05/01/17 1643

## 2017-05-01 NOTE — Telephone Encounter (Signed)
[facility] - Client [facility]  Patient Name: Sue [name]  DOB: [birth year]    Initial Comment Caller states pt is having left arm numbness and tingling, jaw pain.   Nurse Assessment  Nurse: Harlow Mares, RN, Suanne Marker Date/Time (Eastern Time): 05/01/2017 9:13:48 AM  Confirm and document reason for call. If symptomatic, describe symptoms. ---Caller states pt is having left arm numbness and tingling, jaw pain. Reports that her feet are also swelling.  Does the patient have any new or worsening symptoms? ---Yes  Will a triage be completed? ---Yes  Related visit to physician within the last 2 weeks? ---No  Does the PT have any chronic conditions? (i.e. diabetes, asthma, etc.) ---Yes  List chronic conditions. ---HTN; COPD; CHF  Is this a behavioral health or substance abuse call? ---No     Guidelines    Guideline Title Affirmed Question Affirmed Notes  Neurologic Deficit [1] Numbness (i.e., loss of sensation) of the face, arm / hand, or leg / foot on one side of the body AND [2] sudden onset AND [3] present now    Final Disposition User   Call EMS 911 Now Harlow Mares, RN, Suanne Marker    Disagree/Comply: Leta Baptist

## 2017-05-01 NOTE — H&P (Signed)
History and Physical    ANALY BASSFORD TMH:[id number] DOB: Jun 24, 1951 DOA: 05/01/2017  PCP: Sue Held, DO Patient coming from: home  Chief Complaint: Arm and neck numbness  HPI: Sue [name] is a 66 y.o. female with medical history significant of abdominal allergies, depression, emphysema, GERD, hypertension, hypothyroidism, CVA. Patient reports 2 episodes of left neck pain and numbness with radiation to the left arm and associated chest heaviness. First episode occurred on 04/29/2017. Nonexertional. Resolved after approximately 30 minutes with no intervention. Second episode occurred on day of admission while patient was lying in bed at approximately 06:00. Continues to have symptoms at time of admission was significantly relieved after ASA 324 administered. Denies any weakness in her arm or focal neurological deficit such as facial asymmetry or extremity weakness. Endorses recent lower extremity bilateral swelling despite compliance with her 80 mg of Lasix daily. Denies diaphoresis, fevers, palpitations, abdominal pain, dysuria, urgency, flank pain, extends, headache. SOB at baseline.   Patient also endorses several-day history of little to no sleep due to disruption of sleep cycle due to history of working third shift. These flares, from time to time. Patient states she is some very little over the last week or so.  ED Course: ASA 324 and Lasix 80 mg IV administered.  Review of Systems: As per HPI otherwise all other systems reviewed and are negative  Ambulatory Status: No restrictions.  Past Medical History:  Diagnosis Date  . Allergic rhinitis   . Depression   . Emphysema of lung ([place])   . GERD (gastroesophageal reflux disease)   . Hypertension   . Hypothyroidism   . Stroke [facility]) 2016   TIA   . Urine incontinence     Past Surgical History:  Procedure Laterality Date  . ABDOMINAL HYSTERECTOMY    . CESAREAN SECTION      Social History   Social History  .  Marital status: Divorced    Spouse name: N/A  . Number of children: N/A  . Years of education: N/A   Occupational History  . disabled- Occupational hygienist Med    Social History Main Topics  . Smoking status: Former Smoker    Packs/day: 1.00    Years: 40.00    Types: Cigarettes    Start date: 07/21/1972    Quit date: 10/16/2011  . Smokeless tobacco: Never Used     Comment: stopped for around 9 months during 1 pregnancy.  . Alcohol use Yes     Comment: Occ-- Wine  . Drug use: No  . Sexual activity: Not on file   Other Topics Concern  . Not on file   Social History Narrative   [facility] (04/06/17):   Originally from [place]. Has also lived in [place], [place]. She also previously lived in [place] for 20 years. No history of [place] Fever. Moved to  in 1989. 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 No pets currently. No bird exposure. Questionable previous mold exposure in her daughter's home. 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     No Known Allergies  Family History  Problem Relation Age of Onset  . Heart disease Father        MVP and Pics Valve  . Hypertension Father   . Depression Father  Institutionalized x's 2 years  . Bipolar disorder Father   . Hypertension Sister   . Diabetes Sister   . Hyperlipidemia Sister   . Heart disease Sister 34       MI  . Heart disease Brother   . Hypertension Brother   . Heart disease Paternal Grandmother   . Heart disease Paternal Aunt   . Heart disease Paternal Uncle   . Schizophrenia Paternal Aunt   . Asthma Son   . Asthma Son      Prior to Admission medications   Medication Sig Start Date End Date Taking? Authorizing Provider  albuterol (PROVENTIL HFA;VENTOLIN HFA) 108 (90 Base) MCG/ACT inhaler Inhale 1-2 puffs into the  lungs every 6 (six) hours as needed for wheezing or shortness of breath.    [provider]  albuterol (PROVENTIL) (2.5 MG/3ML) 0.083% nebulizer solution Take 3 mLs (2.5 mg total) by nebulization every 6 (six) hours as needed for wheezing or shortness of breath. 03/31/17   Carollee Herter, Alferd Apa, DO  fluticasone (FLONASE) 50 MCG/ACT nasal spray Place 2 sprays into both nostrils daily. Patient taking differently: Place 2 sprays into both nostrils daily as needed for allergies.  01/31/17   Roma Schanz R, DO  furosemide (LASIX) 20 MG tablet TAKE THREE TABLETS BY MOUTH ONCE DAILY Patient taking differently: TAKE 2 TABLETS TWICE DAILY 02/27/17   Carollee Herter, Alferd Apa, DO  levothyroxine (SYNTHROID, LEVOTHROID) 50 MCG tablet Take 1 tablet (50 mcg total) by mouth daily. 08/23/16   Sue Held, DO  LORazepam (ATIVAN) 2 MG tablet Take 1 tablet (2 mg total) by mouth every 6 (six) hours as needed for anxiety. 01/31/17   Roma Schanz R, DO  losartan (COZAAR) 100 MG tablet TAKE 1 TABLET BY MOUTH ONCE DAILY Patient taking differently: TAKE 100 MG BY MOUTH ONCE DAILY 01/24/17   Carollee Herter, Kendrick Fries R, DO  metoprolol succinate (TOPROL-XL) 50 MG 24 hr tablet TAKE TWO TABLETS BY MOUTH IN THE MORNING TAKE WITH OR IMMEDIATELY FOLLOWING A MEAL Patient taking differently: TAKE 100 MG BY MOUTH IN THE MORNING TAKE WITH OR IMMEDIATELY FOLLOWING A MEAL 01/24/17   Roma Schanz R, DO  OXYGEN Inhale 3 L into the lungs continuous.     [provider]  potassium chloride SA (K-DUR,KLOR-CON) 20 MEQ tablet Take 1 tablet (20 mEq total) by mouth daily. 03/31/17   Sue Held, DO  QUEtiapine (SEROQUEL) 50 MG tablet Take 100 mg by mouth at bedtime.  11/09/15   [provider]  ranitidine (ZANTAC) 150 MG tablet Take 1 tablet (150 mg total) by mouth at bedtime. 04/06/17   Javier Glazier, MD  Tiotropium Bromide Monohydrate (SPIRIVA RESPIMAT) 2.5 MCG/ACT AERS Inhale 2 puffs into  the lungs daily. 04/06/17   Javier Glazier, MD  zolpidem (AMBIEN) 5 MG tablet Take 1 tablet (5 mg total) by mouth at bedtime. Patient taking differently: Take 10 mg by mouth at bedtime.  08/23/16   Sue Held, DO    Physical Exam: Vitals:   05/01/17 1200 05/01/17 1230 05/01/17 1304 05/01/17 1315  BP: 133/81 (!) 110/56 121/89 118/67  Pulse: 71 64 67 62  Resp: (!) 22 16 15 16   Temp:      TempSrc:      SpO2: 100% 99% 96% 100%     General:  Appears calm and comfortable Eyes:  PERRL, EOMI, normal lids, iris ENT:  grossly normal hearing, lips & tongue, mmm  Neck:  no LAD, masses or thyromegaly Cardiovascular:  RRR, no m/r/g. 1+ bilateral lower extremity pitting edema.  Respiratory:  CTA bilaterally, no w/r/r. Normal respiratory effort. Abdomen:  soft, ntnd, NABS Skin:  no rash or induration seen on limited exam Musculoskeletal:  grossly normal tone BUE/BLE, good ROM, no bony abnormality Psychiatric:  grossly normal mood and affect, speech fluent and appropriate, AOx3 Neurologic:  CN 2-12 grossly intact, moves all extremities in coordinated fashion, sensation intact  Labs on Admission: I have personally reviewed following labs and imaging studies  CBC:  Recent Labs Lab 05/01/17 1038  WBC 6.6  NEUTROABS 2.3  HGB 13.0  HCT 40.3  MCV 94.6  PLT 932   Basic Metabolic Panel:  Recent Labs Lab 05/01/17 1038  NA 137  K 3.8  CL 104  CO2 25  GLUCOSE 118*  BUN 13  CREATININE 1.10*  CALCIUM 8.8*   GFR: CrCl cannot be calculated (Unknown ideal weight.). Liver Function Tests:  Recent Labs Lab 05/01/17 1038  AST 30  ALT 14  ALKPHOS 111  BILITOT 1.0  PROT 8.6*  ALBUMIN 3.3*   No results for input(s): LIPASE, AMYLASE in the last 168 hours. No results for input(s): AMMONIA in the last 168 hours. Coagulation Profile:  Recent Labs Lab 05/01/17 1038  INR 1.10   Cardiac Enzymes: No results for input(s): CKTOTAL, CKMB, CKMBINDEX, TROPONINI in the last  168 hours. BNP (last 3 results) No results for input(s): PROBNP in the last 8760 hours. HbA1C: No results for input(s): HGBA1C in the last 72 hours. CBG: No results for input(s): GLUCAP in the last 168 hours. Lipid Profile: No results for input(s): CHOL, HDL, LDLCALC, TRIG, CHOLHDL, LDLDIRECT in the last 72 hours. Thyroid Function Tests: No results for input(s): TSH, T4TOTAL, FREET4, T3FREE, THYROIDAB in the last 72 hours. Anemia Panel: No results for input(s): VITAMINB12, FOLATE, FERRITIN, TIBC, IRON, RETICCTPCT in the last 72 hours. Urine analysis:    Component Value Date/Time   COLORURINE COLORLESS (A) 03/25/2017 2349   APPEARANCEUR CLEAR 03/25/2017 2349   LABSPEC 1.002 (L) 03/25/2017 2349   PHURINE 7.0 03/25/2017 2349   GLUCOSEU NEGATIVE 03/25/2017 2349   GLUCOSEU NEGATIVE 11/11/2013 1353   HGBUR NEGATIVE 03/25/2017 2349   BILIRUBINUR NEGATIVE 03/25/2017 2349   BILIRUBINUR neg 10/27/2015 1355   KETONESUR NEGATIVE 03/25/2017 2349   PROTEINUR NEGATIVE 03/25/2017 2349   UROBILINOGEN 0.2 10/27/2015 1355   UROBILINOGEN 0.2 07/29/2014 2300   NITRITE NEGATIVE 03/25/2017 2349   LEUKOCYTESUR NEGATIVE 03/25/2017 2349    Creatinine Clearance: CrCl cannot be calculated (Unknown ideal weight.).  Sepsis Labs: @LABRCNTIP (procalcitonin:4,lacticidven:4) )No results found for this or any previous visit (from the past 240 hour(s)).   Radiological Exams on Admission: Dg Chest 2 View  Result Date: 05/01/2017 CLINICAL DATA:  Left-sided arm and all numbness. EXAM: CHEST  2 VIEW COMPARISON:  Chest radiograph 03/31/2017 FINDINGS: Monitoring leads overlie the patient. Stable cardiac and mediastinal contours. No consolidative pulmonary opacities. No pleural effusion or pneumothorax. Midthoracic spine degenerative changes. IMPRESSION: No acute cardiopulmonary process. Electronically Signed   By: Lovey Newcomer M.D.   On: 05/01/2017 11:59    EKG: Independently reviewed. Sinus. No  ACS  Assessment/Plan Active Problems:   HTN (hypertension)   Depression with anxiety   Insomnia   Mild diastolic dysfunction   Edema   Pulmonary emphysema (HCC)   GERD (gastroesophageal reflux disease)   Atypical chest pain   CHest pain: associated w/ L neck and arm numbness. EKG reasurring. Trop x1  nml. Some relief w/ ASA. Echo 1 mo ago showing EF60% and grade 1 diastolic dysfunction. Last stress test 10+yrs ago which was reportedly nml. Followed by [facility] heart care, Dr Candee Furbish. - Cycle Trop, Tele - EKG in am - outpt f/u w/ cardiology in 1-2wks  CHF: possible mild exacerbation. SOB likely due to obesity hypoventilation syndrome, emphysema, possible mild CAD, and possible acute decompensation of CHF. Pt endorsing subjective LE swelling. Currently w/ 1+ bilat LE edema. CXR w/o evidence of failure. Compliant w/ home Lasix 80mg  po. Echo performed less than 1 mo ago showing EF 60% and Grade 1 diastolic dysfunction. - strict I/O, daily wts - BNP - continue w/ lasix 80mg  IV in PM then return to home dose - f/u outpt  HTN: - continue Cozaar, metop  Hypothyroid: - continue synthroid  Insomnia: recent flare. Pt states due to h/o working 3rd shift.  - continue ambien.   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  - continue spiriva - albuterol PRN  Anxiety: - continue ativan   DVT prophylaxis: hep  Code Status: full  Family Communication: son  Disposition Plan: pending ACS r/o and diuresis Consults called: none  Admission status: observation    [place] J MD [facility]  If 7PM-7AM, please contact night-coverage [email or web address] Password [email or web address]  05/01/2017, 3:12 PM

## 2017-05-02 ENCOUNTER — Encounter (HOSPITAL_COMMUNITY): Payer: Self-pay | Admitting: Family Medicine

## 2017-05-02 DIAGNOSIS — K219 Gastro-esophageal reflux disease without esophagitis: Secondary | ICD-10-CM | POA: Diagnosis not present

## 2017-05-02 DIAGNOSIS — R609 Edema, unspecified: Secondary | ICD-10-CM

## 2017-05-02 DIAGNOSIS — I10 Essential (primary) hypertension: Secondary | ICD-10-CM | POA: Diagnosis not present

## 2017-05-02 DIAGNOSIS — R0789 Other chest pain: Principal | ICD-10-CM

## 2017-05-02 DIAGNOSIS — F418 Other specified anxiety disorders: Secondary | ICD-10-CM | POA: Diagnosis not present

## 2017-05-02 DIAGNOSIS — B182 Chronic viral hepatitis C: Secondary | ICD-10-CM | POA: Diagnosis not present

## 2017-05-02 DIAGNOSIS — E876 Hypokalemia: Secondary | ICD-10-CM | POA: Diagnosis not present

## 2017-05-02 DIAGNOSIS — J441 Chronic obstructive pulmonary disease with (acute) exacerbation: Secondary | ICD-10-CM | POA: Diagnosis not present

## 2017-05-02 DIAGNOSIS — E039 Hypothyroidism, unspecified: Secondary | ICD-10-CM | POA: Diagnosis not present

## 2017-05-02 DIAGNOSIS — I519 Heart disease, unspecified: Secondary | ICD-10-CM

## 2017-05-02 DIAGNOSIS — I110 Hypertensive heart disease with heart failure: Secondary | ICD-10-CM | POA: Diagnosis not present

## 2017-05-02 DIAGNOSIS — I509 Heart failure, unspecified: Secondary | ICD-10-CM | POA: Diagnosis not present

## 2017-05-02 DIAGNOSIS — G4700 Insomnia, unspecified: Secondary | ICD-10-CM | POA: Diagnosis not present

## 2017-05-02 DIAGNOSIS — J439 Emphysema, unspecified: Secondary | ICD-10-CM

## 2017-05-02 LAB — CBC
HCT: 36.7 % (ref 36.0–46.0)
Hemoglobin: 11.9 g/dL — ABNORMAL LOW (ref 12.0–15.0)
MCH: 30.4 pg (ref 26.0–34.0)
MCHC: 32.4 g/dL (ref 30.0–36.0)
MCV: 93.9 fL (ref 78.0–100.0)
PLATELETS: 172 10*3/uL (ref 150–400)
RBC: 3.91 MIL/uL (ref 3.87–5.11)
RDW: 13.6 % (ref 11.5–15.5)
WBC: 6.3 10*3/uL (ref 4.0–10.5)

## 2017-05-02 LAB — COMPREHENSIVE METABOLIC PANEL
ALBUMIN: 3 g/dL — AB (ref 3.5–5.0)
ALT: 13 U/L — AB (ref 14–54)
AST: 25 U/L (ref 15–41)
Alkaline Phosphatase: 99 U/L (ref 38–126)
Anion gap: 5 (ref 5–15)
BUN: 20 mg/dL (ref 6–20)
CHLORIDE: 102 mmol/L (ref 101–111)
CO2: 31 mmol/L (ref 22–32)
Calcium: 8.5 mg/dL — ABNORMAL LOW (ref 8.9–10.3)
Creatinine, Ser: 1.3 mg/dL — ABNORMAL HIGH (ref 0.44–1.00)
GFR calc Af Amer: 49 mL/min — ABNORMAL LOW (ref >60)
GFR calc non Af Amer: 42 mL/min — ABNORMAL LOW (ref >60)
GLUCOSE: 105 mg/dL — AB (ref 65–99)
POTASSIUM: 4 mmol/L (ref 3.5–5.1)
SODIUM: 138 mmol/L (ref 135–145)
Total Bilirubin: 0.7 mg/dL (ref 0.3–1.2)
Total Protein: 8 g/dL (ref 6.5–8.1)

## 2017-05-02 LAB — TROPONIN I: Troponin I: <0.03 ng/mL (ref <0.03)

## 2017-05-02 MED ORDER — FLUTICASONE PROPIONATE 50 MCG/ACT NA SUSP: 2.0000 | Freq: Every day | NASAL | Status: DC | PRN

## 2017-05-02 MED ORDER — TIOTROPIUM BROMIDE MONOHYDRATE 2.5 MCG/ACT IN AERS: 2.0000 | INHALATION_SPRAY | Freq: Every day | RESPIRATORY_TRACT | 3 refills | Status: DC

## 2017-05-02 MED ORDER — ASPIRIN 81 MG PO TBEC: 81.0000 mg | DELAYED_RELEASE_TABLET | Freq: Every day | ORAL | Status: DC

## 2017-05-02 MED ORDER — METOPROLOL SUCCINATE ER 50 MG PO TB24: ORAL_TABLET | ORAL | 5 refills | Status: DC

## 2017-05-02 MED ORDER — ZOLPIDEM TARTRATE 5 MG PO TABS: 10.0000 mg | ORAL_TABLET | Freq: Every day | ORAL | Status: DC

## 2017-05-02 NOTE — Care Management Note (Addendum)
Case Management Note Marvetta Gibbons RN, BSN Unit 4E-Case Manager [phone]  Patient Details  Name: Sue [name] MRN: [id number] Date of Birth: 1951/07/14  Subjective/Objective:   Pt presented with atypical c/p- placed in obs              Action/Plan: PTA pt lived at home- independent- works 3rd shift- compliant with medications- has home 02 at baseline. No CM needs noted for discharge. Pt to f/u with PCP.   Expected Discharge Date:  05/02/17               Expected Discharge Plan:  Home/Self Care  In-House Referral:     Discharge planning Services  CM Consult  Post Acute Care Choice:  NA Choice offered to:  NA  DME Arranged:    DME Agency:     HH Arranged:    HH Agency:     Status of Service:  Completed, signed off  If discussed at [place] of Stay Meetings, dates discussed:    Discharge Disposition: home/self care   Additional Comments:  Dawayne Patricia, RN 05/02/2017, 12:17 PM

## 2017-05-02 NOTE — Discharge Summary (Signed)
Physician Discharge Summary  Sue [name]:[id number] DOB: Jan 23, 1951 DOA: 05/01/2017  PCP: Ann Held, DO Cardiologist: Dr. Marlou Porch  Admit date: 05/01/2017 Discharge date: 05/02/2017  Admitted From: HOME   Disposition: HOME   Recommendations for Outpatient Follow-up:  1. Follow up with PCP in 1 weeks 2. Please obtain BMP/CBC in one week  Discharge Condition: STABLE   CODE STATUS: FULL    Brief Hospitalization Summary: Please see all hospital notes, images, labs for full details of the hospitalization.  Sue [name] is a 66 y.o. female with medical history significant of abdominal allergies, depression, emphysema, GERD, hypertension, hypothyroidism, CVA. Patient reports 2 episodes of left neck pain and numbness with radiation to the left arm and associated chest heaviness. First episode occurred on 04/29/2017. Nonexertional. Resolved after approximately 30 minutes with no intervention. Second episode occurred on day of admission while patient was lying in bed at approximately 06:00. Continues to have symptoms at time of admission was significantly relieved after ASA 324 administered. Denies any weakness in her arm or focal neurological deficit such as facial asymmetry or extremity weakness. Endorses recent lower extremity bilateral swelling despite compliance with her 80 mg of Lasix daily. Denies diaphoresis, fevers, palpitations, abdominal pain, dysuria, urgency, flank pain, extends, headache. SOB at baseline.   Patient also endorses several-day history of little to no sleep due to disruption of sleep cycle due to history of working third shift. These flares, from time to time. Patient states she is some very little over the last week or so.  ED Course: ASA 324 and Lasix 80 mg IV administered.  CHest pain: associated w/ L neck and arm numbness. EKG reasurring. Trop x1 nml. Some relief w/ ASA. Echo 1 mo ago showing EF60% and grade 1 diastolic dysfunction. Last stress test  10+yrs ago which was reportedly nml. Followed by [facility] heart care, Dr Candee Furbish. - troponins have been negative x 4, telemetry stable, monitored closely.  - outpt f/u w/ cardiology in 1 wks, she is seen by dr. Marlou Porch.  Repeat troponin this morning normal.  - Pt says symptoms completely resolved with diuresis.   CHF: mild exacerbation. SOB likely due to obesity hypoventilation syndrome, emphysema, possible mild CAD, and possible acute decompensation of CHF. Pt endorsing subjective LE swelling. Currently w/ 1+ bilat LE edema. CXR w/o evidence of failure. Compliant w/ home Lasix 80mg  po. Echo performed less than 1 mo ago showing EF 60% and Grade 1 diastolic dysfunction. Pt was given 80 mg IV lasix with good diuresis and feels much better now and asking to go home this morning.  Resume home lasix dose and follow up with PCP and cardiologist later this week.   HTN: stable.  - continue Cozaar, metop  Hypothyroid: - continue synthroid  Insomnia: recent flare. Pt states due to h/o working 3rd shift.  - continue ambien.   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  - continue spiriva - albuterol PRN  Anxiety: - continue ativan   DVT prophylaxis: hep  Code Status: full  Family Communication: son  Disposition Plan: pending ACS r/o and diuresis Consults called: none  Admission status: observation    Discharge Diagnoses:  Active Problems:   HTN (hypertension)   Depression with anxiety   Insomnia   Mild diastolic dysfunction   Edema   Pulmonary emphysema (HCC)   GERD (gastroesophageal reflux disease)   Atypical chest pain  Discharge Instructions: Discharge Instructions    Call MD for:  difficulty breathing, headache or visual disturbances  Complete by:  As directed    Call MD for:  extreme fatigue    Complete by:  As directed    Call MD for:  hives    Complete by:  As directed    Call MD for:  persistant dizziness or light-headedness    Complete by:  As directed    Call MD for:   persistant nausea and vomiting    Complete by:  As directed    Call MD for:  severe uncontrolled pain    Complete by:  As directed    Call MD for:  temperature >100.4    Complete by:  As directed    Diet - low sodium heart healthy    Complete by:  As directed    Increase activity slowly    Complete by:  As directed      Allergies as of 05/02/2017   No Known Allergies     Medication List    TAKE these medications   albuterol 108 (90 Base) MCG/ACT inhaler Commonly known as:  PROVENTIL HFA;VENTOLIN HFA Inhale 1-2 puffs into the lungs every 6 (six) hours as needed for wheezing or shortness of breath.   albuterol (2.5 MG/3ML) 0.083% nebulizer solution Commonly known as:  PROVENTIL Take 3 mLs (2.5 mg total) by nebulization every 6 (six) hours as needed for wheezing or shortness of breath.   aspirin 81 MG EC tablet Take 1 tablet (81 mg total) by mouth daily.   ATIVAN 2 MG tablet Generic drug:  LORazepam Take 1 tablet (2 mg total) by mouth every 6 (six) hours as needed for anxiety.   fluticasone 50 MCG/ACT nasal spray Commonly known as:  FLONASE Place 2 sprays into both nostrils daily as needed for allergies.   furosemide 20 MG tablet Commonly known as:  LASIX TAKE THREE TABLETS BY MOUTH ONCE DAILY What changed:  See the new instructions.   levothyroxine 50 MCG tablet Commonly known as:  SYNTHROID, LEVOTHROID Take 1 tablet (50 mcg total) by mouth daily.   losartan 100 MG tablet Commonly known as:  COZAAR TAKE 1 TABLET BY MOUTH ONCE DAILY What changed:  See the new instructions.   metoprolol succinate 50 MG 24 hr tablet Commonly known as:  TOPROL-XL TAKE 100 MG BY MOUTH IN THE MORNING TAKE WITH OR IMMEDIATELY FOLLOWING A MEAL   OXYGEN Inhale 3 L into the lungs continuous.   potassium chloride SA 20 MEQ tablet Commonly known as:  K-DUR,KLOR-CON Take 1 tablet (20 mEq total) by mouth daily.   QUEtiapine 50 MG tablet Commonly known as:  SEROQUEL Take 100 mg by mouth  at bedtime.   ranitidine 150 MG tablet Commonly known as:  ZANTAC Take 1 tablet (150 mg total) by mouth at bedtime.   Tiotropium Bromide Monohydrate 2.5 MCG/ACT Aers Commonly known as:  SPIRIVA RESPIMAT Inhale 2 puffs into the lungs daily. What changed:  when to take this  reasons to take this   zolpidem 5 MG tablet Commonly known as:  AMBIEN Take 2 tablets (10 mg total) by mouth at bedtime.      Follow-up Information    Ann Held, DO. Schedule an appointment as soon as possible for a visit in 3 day(s).   Specialty:  Family Medicine Why:  Hospital Follow Up  Contact information: [address] [place] [address] [phone]        Jerline Pain, MD. Schedule an appointment as soon as possible for a visit  in 1 week(s).   Specialty:  Cardiology Why:  hospital Follow Up  Contact information: [address]  [id number] [phone]          No Known Allergies Current Discharge Medication List    START taking these medications   Details  aspirin EC 81 MG EC tablet Take 1 tablet (81 mg total) by mouth daily.      CONTINUE these medications which have CHANGED   Details  fluticasone (FLONASE) 50 MCG/ACT nasal spray Place 2 sprays into both nostrils daily as needed for allergies.   Associated Diagnoses: Seasonal allergic rhinitis due to pollen    metoprolol succinate (TOPROL-XL) 50 MG 24 hr tablet TAKE 100 MG BY MOUTH IN THE MORNING TAKE WITH OR IMMEDIATELY FOLLOWING A MEAL Qty: 60 tablet, Refills: 5    Tiotropium Bromide Monohydrate (SPIRIVA RESPIMAT) 2.5 MCG/ACT AERS Inhale 2 puffs into the lungs daily. Qty: 1 Inhaler, Refills: 3    zolpidem (AMBIEN) 5 MG tablet Take 2 tablets (10 mg total) by mouth at bedtime.   Associated Diagnoses: Insomnia, unspecified type      CONTINUE these medications which have NOT CHANGED   Details  albuterol (PROVENTIL HFA;VENTOLIN HFA) 108 (90 Base) MCG/ACT inhaler  Inhale 1-2 puffs into the lungs every 6 (six) hours as needed for wheezing or shortness of breath.    albuterol (PROVENTIL) (2.5 MG/3ML) 0.083% nebulizer solution Take 3 mLs (2.5 mg total) by nebulization every 6 (six) hours as needed for wheezing or shortness of breath. Qty: 150 mL, Refills: 1   Associated Diagnoses: COPD exacerbation (HCC)    furosemide (LASIX) 20 MG tablet TAKE THREE TABLETS BY MOUTH ONCE DAILY Qty: 270 tablet, Refills: 1   Associated Diagnoses: Edema of both legs    levothyroxine (SYNTHROID, LEVOTHROID) 50 MCG tablet Take 1 tablet (50 mcg total) by mouth daily. Qty: 30 tablet, Refills: 11   Associated Diagnoses: Hypothyroidism, unspecified type    LORazepam (ATIVAN) 2 MG tablet Take 1 tablet (2 mg total) by mouth every 6 (six) hours as needed for anxiety. Qty: 30 tablet, Refills: 0    losartan (COZAAR) 100 MG tablet TAKE 1 TABLET BY MOUTH ONCE DAILY Qty: 90 tablet, Refills: 0   Associated Diagnoses: Essential hypertension    OXYGEN Inhale 3 L into the lungs continuous.     potassium chloride SA (K-DUR,KLOR-CON) 20 MEQ tablet Take 1 tablet (20 mEq total) by mouth daily. Qty: 30 tablet, Refills: 2   Associated Diagnoses: Hypokalemia    QUEtiapine (SEROQUEL) 50 MG tablet Take 100 mg by mouth at bedtime.  Refills: 3    ranitidine (ZANTAC) 150 MG tablet Take 1 tablet (150 mg total) by mouth at bedtime. Qty: 30 tablet, Refills: 3        Procedures/Studies: Dg Chest 2 View  Result Date: 05/01/2017 CLINICAL DATA:  Left-sided arm and all numbness. EXAM: CHEST  2 VIEW COMPARISON:  Chest radiograph 03/31/2017 FINDINGS: Monitoring leads overlie the patient. Stable cardiac and mediastinal contours. No consolidative pulmonary opacities. No pleural effusion or pneumothorax. Midthoracic spine degenerative changes. IMPRESSION: No acute cardiopulmonary process. Electronically Signed   By: Lovey Newcomer M.D.   On: 05/01/2017 11:59      Subjective: Pt says that she has no  chest pain and no SOB, she feels much better after diuresis and wants to go home this morning, says she will see her PCP and cardiologist later this week.    Discharge Exam: Vitals:   05/02/17 0435 05/02/17  0900  BP: 97/65 (!) 107/59  Pulse: 69 82  Resp: (!) 22 (!) 24  Temp:  98.1 F (36.7 C)  SpO2: 96% 94%   Vitals:   05/01/17 1900 05/02/17 0427 05/02/17 0435 05/02/17 0900  BP: (!) 90/50  97/65 (!) 107/59  Pulse:  72 69 82  Resp:  (!) 25 (!) 22 (!) 24  Temp: (!) 97.4 F (36.3 C) 97.9 F (36.6 C)  98.1 F (36.7 C)  TempSrc: Oral Oral  Oral  SpO2:  100% 96% 94%  Weight: 134.7 kg (296 lb 14.4 oz) 134.4 kg (296 lb 3.2 oz)    Height: 5\' 3"  (1.6 m)      General: Pt is alert, awake, not in acute distress Cardiovascular: RRR, S1/S2 +, no rubs, no gallops Respiratory: CTA bilaterally, no wheezing, no rhonchi Abdominal: Soft, NT, ND, bowel sounds + Extremities: no edema, no cyanosis   The results of significant diagnostics from this hospitalization (including imaging, microbiology, ancillary and laboratory) are listed below for reference.     Microbiology: No results found for this or any previous visit (from the past 240 hour(s)).   Labs: BNP (last 3 results)  Recent Labs  03/25/17 2335 05/01/17 1532  BNP 15.3 70.2   Basic Metabolic Panel:  Recent Labs Lab 05/01/17 1038 05/02/17 0424  NA 137 138  K 3.8 4.0  CL 104 102  CO2 25 31  GLUCOSE 118* 105*  BUN 13 20  CREATININE 1.10* 1.30*  CALCIUM 8.8* 8.5*   Liver Function Tests:  Recent Labs Lab 05/01/17 1038 05/02/17 0424  AST 30 25  ALT 14 13*  ALKPHOS 111 99  BILITOT 1.0 0.7  PROT 8.6* 8.0  ALBUMIN 3.3* 3.0*   No results for input(s): LIPASE, AMYLASE in the last 168 hours. No results for input(s): AMMONIA in the last 168 hours. CBC:  Recent Labs Lab 05/01/17 1038 05/02/17 0424  WBC 6.6 6.3  NEUTROABS 2.3  --   HGB 13.0 11.9*  HCT 40.3 36.7  MCV 94.6 93.9  PLT 171 172   Cardiac  Enzymes:  Recent Labs Lab 05/01/17 1532 05/01/17 1853 05/01/17 2202 05/02/17 0935  TROPONINI <0.03 <0.03 <0.03 <0.03   BNP: Invalid input(s): POCBNP CBG: No results for input(s): GLUCAP in the last 168 hours. D-Dimer No results for input(s): DDIMER in the last 72 hours. Hgb A1c No results for input(s): HGBA1C in the last 72 hours. Lipid Profile No results for input(s): CHOL, HDL, LDLCALC, TRIG, CHOLHDL, LDLDIRECT in the last 72 hours. Thyroid function studies No results for input(s): TSH, T4TOTAL, T3FREE, THYROIDAB in the last 72 hours.  Invalid input(s): FREET3 Anemia work up No results for input(s): VITAMINB12, FOLATE, FERRITIN, TIBC, IRON, RETICCTPCT in the last 72 hours. Urinalysis    Component Value Date/Time   COLORURINE COLORLESS (A) 03/25/2017 2349   APPEARANCEUR CLEAR 03/25/2017 2349   LABSPEC 1.002 (L) 03/25/2017 2349   PHURINE 7.0 03/25/2017 2349   GLUCOSEU NEGATIVE 03/25/2017 2349   GLUCOSEU NEGATIVE 11/11/2013 1353   HGBUR NEGATIVE 03/25/2017 2349   BILIRUBINUR NEGATIVE 03/25/2017 2349   BILIRUBINUR neg 10/27/2015 1355   KETONESUR NEGATIVE 03/25/2017 2349   PROTEINUR NEGATIVE 03/25/2017 2349   UROBILINOGEN 0.2 10/27/2015 1355   UROBILINOGEN 0.2 07/29/2014 2300   NITRITE NEGATIVE 03/25/2017 2349   LEUKOCYTESUR NEGATIVE 03/25/2017 2349   Sepsis Labs Invalid input(s): PROCALCITONIN,  WBC,  LACTICIDVEN Microbiology No results found for this or any previous visit (from the past 240 hour(s)).  Time coordinating discharge:  SIGNED:  Irwin Brakeman, MD  [facility] 05/02/2017, 11:22 AM Pager [phone]  If 7PM-7AM, please contact night-coverage [email or web address] Password [email or web address]

## 2017-05-02 NOTE — Consult Note (Signed)
[facility] CM Primary Care Navigator  05/02/2017  Sue [name] 04/06/1951 [id number]   Met withpatient and daughter Sue [name]) at the bedside to identify possible discharge needs.  Patientreports having "numbness and tingling" to her left jaw down to left arm; chest pressure and swelling to both legs which led to this admission.  Patient endorses Dr. Garnet Koyanagi- [place] with [facility] as herprimary care provider.   Patient shared using [facility] [address] and [facility] on [address] to obtain medications without difficulty.   Patient states she manages hermedications at home straight out of the containers.   She reports that her daughter providestransportation toherdoctors'appointments.  Patient lives alone and independent with her care. Daughter and son Scot Dock) are her primary caregivers at home when needed per patient's report.Patient uses home oxygen at baseline.  Discharge planis home per patient.   Patient and daughter voiced understanding to call her primary care provider's office,once she gets homefor a post discharge follow-upappointment within a week or sooner if needed.Patient letter (with PCP's contact number) was provided as a reminder.  Per MD Note, patienthas possible mild exacerbation/ acute decompensation of congestive heart failure on this admission. Explained to patient about [facility] CM services available for health management at home (HF/ COPD). Patient had verbally agreed and opted for EMMI HF calls to follow-up with herrecovery at home.  Referral was made for EMMI HFcalls after discharge.  Patient expressed understanding to [facility]ref[facility] to [facility] care managementfrom primary care provider if needs further servicesin the future.   [facility] care management information provided for future needs that may arise.    For questions, please contact:  Dannielle Huh, BSN, RN- [facility] Primary  Care Navigator  Telephone: [phone] [place]

## 2017-05-02 NOTE — Progress Notes (Signed)
Patient in a stable condition, discharge education reviewed with patient she verbalized understanding, paper prescription given to patient, iv removed, tele dc ccmd noticed, patient belongings at bedside, patient's daughter to transport patient home.

## 2017-05-03 ENCOUNTER — Telehealth: Payer: Self-pay | Admitting: Behavioral Health

## 2017-05-03 NOTE — Telephone Encounter (Signed)
Transition Care Management Follow-up Telephone Call  PCP: Ann Held, DO Cardiologist: Dr. Marlou Porch  Admit date: 05/01/2017 Discharge date: 05/02/2017  Admitted From: HOME   Disposition: HOME   Recommendations for Outpatient Follow-up:  1. Follow up with PCP in 1 weeks 2. Please obtain BMP/CBC in one week  Discharge Condition: STABLE     How have you been since you were released from the hospital? Patient stated, "the edema is gone".   Do you understand why you were in the hospital? yes   Do you understand the discharge instructions? yes   Where were you discharged to? Home   Items Reviewed:  Medications reviewed: yes  Allergies reviewed: yes  Dietary changes reviewed: yes, low sodium, heart healthy diet  Referrals reviewed: yes, follow-up with PCP & Cardiology   Functional Questionnaire:   Activities of Daily Living (ADLs):   She states they are independent in the following: ambulation, bathing and hygiene, feeding, continence, grooming, toileting and dressing States they require assistance with the following: None   Any transportation issues/concerns?: no   Any patient concerns? no   Confirmed importance and date/time of follow-up visits scheduled yes, 05/11/17 at 11:30 AM.  Provider Appointment booked with Dr. Carollee Herter.  Confirmed with patient if condition begins to worsen call PCP or go to the ER.  Patient was given the office number and encouraged to call back with question or concerns.  : yes

## 2017-05-04 DIAGNOSIS — F319 Bipolar disorder, unspecified: Secondary | ICD-10-CM | POA: Diagnosis not present

## 2017-05-04 NOTE — Telephone Encounter (Signed)
TCM/Hospital Follow-up appointment has been scheduled for 05/11/17 at 11:30 AM.

## 2017-05-04 NOTE — Telephone Encounter (Signed)
Needs hosp f/u. 

## 2017-05-11 ENCOUNTER — Encounter: Payer: Self-pay | Admitting: Family Medicine

## 2017-05-11 ENCOUNTER — Ambulatory Visit (INDEPENDENT_AMBULATORY_CARE_PROVIDER_SITE_OTHER): Payer: Medicare HMO | Admitting: Family Medicine

## 2017-05-11 VITALS — BP 114/74 | HR 82 | Ht 63.0 in | Wt 300.4 lb

## 2017-05-11 DIAGNOSIS — F418 Other specified anxiety disorders: Secondary | ICD-10-CM

## 2017-05-11 DIAGNOSIS — E669 Obesity, unspecified: Secondary | ICD-10-CM

## 2017-05-11 DIAGNOSIS — Z23 Encounter for immunization: Secondary | ICD-10-CM

## 2017-05-11 DIAGNOSIS — I519 Heart disease, unspecified: Secondary | ICD-10-CM | POA: Diagnosis not present

## 2017-05-11 DIAGNOSIS — R079 Chest pain, unspecified: Secondary | ICD-10-CM | POA: Diagnosis not present

## 2017-05-11 DIAGNOSIS — J439 Emphysema, unspecified: Secondary | ICD-10-CM

## 2017-05-11 DIAGNOSIS — I10 Essential (primary) hypertension: Secondary | ICD-10-CM | POA: Diagnosis not present

## 2017-05-11 LAB — BASIC METABOLIC PANEL
BUN: 12 mg/dL (ref 6–23)
CALCIUM: 9.6 mg/dL (ref 8.4–10.5)
CO2: 32 mEq/L (ref 19–32)
Chloride: 100 mEq/L (ref 96–112)
Creatinine, Ser: 1.15 mg/dL (ref 0.40–1.20)
GFR: 60.94 mL/min (ref >60.00)
Glucose, Bld: 136 mg/dL — ABNORMAL HIGH (ref 70–99)
POTASSIUM: 3.7 meq/L (ref 3.5–5.1)
SODIUM: 136 meq/L (ref 135–145)

## 2017-05-11 LAB — CBC WITH DIFFERENTIAL/PLATELET
BASOS ABS: 0 10*3/uL (ref 0.0–0.1)
Basophils Relative: 0.5 % (ref 0.0–3.0)
EOS PCT: 3.4 % (ref 0.0–5.0)
Eosinophils Absolute: 0.2 10*3/uL (ref 0.0–0.7)
HEMATOCRIT: 39.3 % (ref 36.0–46.0)
HEMOGLOBIN: 12.9 g/dL (ref 12.0–15.0)
Lymphocytes Relative: 54.5 % — ABNORMAL HIGH (ref 12.0–46.0)
Lymphs Abs: 2.9 10*3/uL (ref 0.7–4.0)
MCHC: 32.8 g/dL (ref 30.0–36.0)
MCV: 95.9 fl (ref 78.0–100.0)
MONO ABS: 0.5 10*3/uL (ref 0.1–1.0)
MONOS PCT: 9 % (ref 3.0–12.0)
Neutro Abs: 1.7 10*3/uL (ref 1.4–7.7)
Neutrophils Relative %: 32.6 % — ABNORMAL LOW (ref 43.0–77.0)
Platelets: 167 10*3/uL (ref 150.0–400.0)
RBC: 4.1 Mil/uL (ref 3.87–5.11)
RDW: 13.8 % (ref 11.5–15.5)
WBC: 5.3 10*3/uL (ref 4.0–10.5)

## 2017-05-11 NOTE — Assessment & Plan Note (Signed)
F/u pulm  Pt on O2

## 2017-05-11 NOTE — Progress Notes (Signed)
Patient ID: Sue [name], female    DOB: [birth year]  Age: 66 y.o. MRN: [id number]    Subjective:  Subjective  HPI [name] presents for hosp f/u she was admitted 8/20-8/21 for chest pain-- she ruled out for MI and was sent home   Pt has an appointment with cardiology coming up.  Pt states she still has episodes where she feels like it is going to come back but then she sits down and takes deep breaths and she feels better.  Pt states she is finding it harder and harder to get her house work done.  She also has a f/u with pulmonary in [place].   Review of Systems  Constitutional: Negative for activity change, appetite change, fatigue and unexpected weight change.  Respiratory: Positive for shortness of breath. Negative for cough.   Cardiovascular: Negative for chest pain and palpitations.  Psychiatric/Behavioral: Negative for behavioral problems and dysphoric mood. The patient is nervous/anxious.     History Past Medical History:  Diagnosis Date  . Allergic rhinitis   . Depression   . Emphysema of lung ([place])   . GERD (gastroesophageal reflux disease)   . Hypertension   . Hypothyroidism   . Stroke [facility]) 2016   TIA   . Urine incontinence     She has a past surgical history that includes Abdominal hysterectomy and Cesarean section.   Her family history includes Asthma in her son and son; Bipolar disorder in her father; Depression in her father; Diabetes in her sister; Heart disease in her brother, father, paternal aunt, paternal grandmother, and paternal uncle; Heart disease (age of onset: 22) in her sister; Hyperlipidemia in her sister; Hypertension in her brother, father, and sister; Schizophrenia in her paternal aunt.She reports that she quit smoking about 5 years ago. Her smoking use included Cigarettes. She started smoking about 44 years ago. She has a 40.00 pack-year smoking history. She has never used smokeless tobacco. She reports that she drinks alcohol. She reports that she  does not use drugs.  Current Outpatient Prescriptions on File Prior to Visit  Medication Sig Dispense Refill  . albuterol (PROVENTIL HFA;VENTOLIN HFA) 108 (90 Base) MCG/ACT inhaler Inhale 1-2 puffs into the lungs every 6 (six) hours as needed for wheezing or shortness of breath.    Marland Kitchen albuterol (PROVENTIL) (2.5 MG/3ML) 0.083% nebulizer solution Take 3 mLs (2.5 mg total) by nebulization every 6 (six) hours as needed for wheezing or shortness of breath. 150 mL 1  . aspirin EC 81 MG EC tablet Take 1 tablet (81 mg total) by mouth daily.    . furosemide (LASIX) 20 MG tablet TAKE THREE TABLETS BY MOUTH ONCE DAILY (Patient taking differently: TAKE 4 TABLETS (80mg ) DAILY) 270 tablet 1  . levothyroxine (SYNTHROID, LEVOTHROID) 50 MCG tablet Take 1 tablet (50 mcg total) by mouth daily. 30 tablet 11  . LORazepam (ATIVAN) 2 MG tablet Take 1 tablet (2 mg total) by mouth every 6 (six) hours as needed for anxiety. 30 tablet 0  . losartan (COZAAR) 100 MG tablet TAKE 1 TABLET BY MOUTH ONCE DAILY (Patient taking differently: TAKE 100 MG BY MOUTH ONCE DAILY) 90 tablet 0  . metoprolol succinate (TOPROL-XL) 50 MG 24 hr tablet TAKE 100 MG BY MOUTH IN THE MORNING TAKE WITH OR IMMEDIATELY FOLLOWING A MEAL 60 tablet 5  . OXYGEN Inhale 3 L into the lungs continuous.     . potassium chloride SA (K-DUR,KLOR-CON) 20 MEQ tablet Take 1 tablet (20 mEq total) by mouth  daily. 30 tablet 2  . QUEtiapine (SEROQUEL) 50 MG tablet Take 100 mg by mouth at bedtime.   3  . ranitidine (ZANTAC) 150 MG tablet Take 1 tablet (150 mg total) by mouth at bedtime. 30 tablet 3  . Tiotropium Bromide Monohydrate (SPIRIVA RESPIMAT) 2.5 MCG/ACT AERS Inhale 2 puffs into the lungs daily. 1 Inhaler 3  . zolpidem (AMBIEN) 5 MG tablet Take 2 tablets (10 mg total) by mouth at bedtime.    . fluticasone (FLONASE) 50 MCG/ACT nasal spray Place 2 sprays into both nostrils daily as needed for allergies. (Patient not taking: Reported on 05/11/2017)     No current  facility-administered medications on file prior to visit.      Objective:  Objective  Physical Exam  Constitutional: She is oriented to person, place, and time. She appears well-developed and well-nourished.  HENT:  Head: Normocephalic and atraumatic.  Eyes: Conjunctivae and EOM are normal.  Neck: Normal range of motion. Neck supple. No JVD present. Carotid bruit is not present. No thyromegaly present.  Cardiovascular: Normal rate, regular rhythm and normal heart sounds.   No murmur heard. Pulmonary/Chest: Effort normal and breath sounds normal. No respiratory distress. She has no wheezes. She has no rales. She exhibits no tenderness.  Musculoskeletal: She exhibits no edema.  Neurological: She is alert and oriented to person, place, and time.  Psychiatric: Her speech is normal and behavior is normal. Judgment and thought content normal. Her mood appears anxious. Her affect is not angry. Cognition and memory are normal. She does not exhibit a depressed mood.  Nursing note and vitals reviewed.  BP 114/74 (BP Location: Right Arm, Patient Position: Sitting, Cuff Size: Large)   Pulse 82   Ht 5\' 3"  (1.6 m)   Wt (!) 300 lb 6.4 oz (136.3 kg)   SpO2 91%   BMI 53.21 kg/m  Wt Readings from Last 3 Encounters:  05/11/17 (!) 300 lb 6.4 oz (136.3 kg)  05/02/17 296 lb 3.2 oz (134.4 kg)  04/06/17 296 lb 12.8 oz (134.6 kg)     Lab Results  Component Value Date   WBC 6.3 05/02/2017   HGB 11.9 (L) 05/02/2017   HCT 36.7 05/02/2017   PLT 172 05/02/2017   GLUCOSE 105 (H) 05/02/2017   CHOL 126 02/27/2017   TRIG 77.0 02/27/2017   HDL 30.30 (L) 02/27/2017   LDLCALC 81 02/27/2017   ALT 13 (L) 05/02/2017   AST 25 05/02/2017   NA 138 05/02/2017   K 4.0 05/02/2017   CL 102 05/02/2017   CREATININE 1.30 (H) 05/02/2017   BUN 20 05/02/2017   CO2 31 05/02/2017   TSH 4.31 02/27/2017   INR 1.10 05/01/2017   HGBA1C 5.8 07/19/2016    Dg Chest 2 View  Result Date: 05/01/2017 CLINICAL DATA:   Left-sided arm and all numbness. EXAM: CHEST  2 VIEW COMPARISON:  Chest radiograph 03/31/2017 FINDINGS: Monitoring leads overlie the patient. Stable cardiac and mediastinal contours. No consolidative pulmonary opacities. No pleural effusion or pneumothorax. Midthoracic spine degenerative changes. IMPRESSION: No acute cardiopulmonary process. Electronically Signed   By: Lovey Newcomer M.D.   On: 05/01/2017 11:59     Assessment & Plan:  Plan  I am having Ms. Vandekamp maintain her OXYGEN, QUEtiapine, levothyroxine, losartan, LORazepam, furosemide, albuterol, potassium chloride SA, albuterol, ranitidine, aspirin, fluticasone, metoprolol succinate, Tiotropium Bromide Monohydrate, and zolpidem.  No orders of the defined types were placed in this encounter.   Problem List Items Addressed This Visit  Unprioritized   Depression with anxiety    Per psych      HTN (hypertension)    Well controlled, no changes to meds. Encouraged heart healthy diet such as the DASH diet and exercise as tolerated.        Mild diastolic dysfunction    Pt has b/u cardilogy      Obesity (BMI 30-39.9)    Healthy weight and wellness-- info       Pulmonary emphysema ([place])    F/u pulm  Pt on O2       Other Visit Diagnoses    Need for influenza vaccination    -  Primary   Relevant Orders   Flu Vaccine QUAD 36+ mos IM (Fluarix & Fluzone Quad PF (Completed)   Chest pain, unspecified type       Relevant Orders   Basic metabolic panel   CBC with Differential/Platelet      Follow-up: Return in about 4 months (around 09/10/2017) for appointment in dec .  Ann Held, DO

## 2017-05-11 NOTE — Assessment & Plan Note (Signed)
Pt has b/u cardilogy

## 2017-05-11 NOTE — Patient Instructions (Signed)

## 2017-05-11 NOTE — Assessment & Plan Note (Signed)
Healthy weight and wellness-- info

## 2017-05-11 NOTE — Assessment & Plan Note (Signed)
Well controlled, no changes to meds. Encouraged heart healthy diet such as the DASH diet and exercise as tolerated.  °

## 2017-05-11 NOTE — Assessment & Plan Note (Signed)
Per psych 

## 2017-05-18 ENCOUNTER — Telehealth: Payer: Self-pay | Admitting: Family Medicine

## 2017-05-18 NOTE — Telephone Encounter (Signed)
Caller name: Manus Gunning  Relation to pt: referral specialist [facility]  Call back number: [phone]    Reason for call:  [facility] referred patient for palliative care, [facility] [place]  Dr. Etter Sjogren will be the covering doctor.

## 2017-05-19 NOTE — Telephone Encounter (Signed)
noted 

## 2017-05-19 NOTE — Telephone Encounter (Signed)
Spoke with Manus Gunning and informed her of Dr. Nonda Lou message. She had no additional questions at this time. Nothing further is needed

## 2017-06-01 NOTE — Progress Notes (Deleted)
Cardiology Office Note   Date:  06/01/2017   ID:  Sue [name], DOB 05/26/1951, MRN [id number]  PCP:  Ann Held, DO  Cardiologist:  Dr. Marlou Porch      No chief complaint on file.     History of Present Illness: Sue [name] is a 66 y.o. female who presents for post hospitalization for chest pain, mild CHF. Troponin neg X 3, BNP 10.4,  Cr at 1.30 up from 1.10   hx of GERD, emphysema, has home 02,  stroke, hypertension here for evaluation of heart failure   Echo 03/2017 EF 65-70%, mild LVH, G1DD.    Today ***  Past Medical History:  Diagnosis Date  . Allergic rhinitis   . Depression   . Emphysema of lung ([place])   . GERD (gastroesophageal reflux disease)   . Hypertension   . Hypothyroidism   . Stroke [facility]) 2016   TIA   . Urine incontinence     Past Surgical History:  Procedure Laterality Date  . ABDOMINAL HYSTERECTOMY    . CESAREAN SECTION       Current Outpatient Prescriptions  Medication Sig Dispense Refill  . albuterol (PROVENTIL HFA;VENTOLIN HFA) 108 (90 Base) MCG/ACT inhaler Inhale 1-2 puffs into the lungs every 6 (six) hours as needed for wheezing or shortness of breath.    Marland Kitchen albuterol (PROVENTIL) (2.5 MG/3ML) 0.083% nebulizer solution Take 3 mLs (2.5 mg total) by nebulization every 6 (six) hours as needed for wheezing or shortness of breath. 150 mL 1  . aspirin EC 81 MG EC tablet Take 1 tablet (81 mg total) by mouth daily.    . fluticasone (FLONASE) 50 MCG/ACT nasal spray Place 2 sprays into both nostrils daily as needed for allergies. (Patient not taking: Reported on 05/11/2017)    . furosemide (LASIX) 20 MG tablet TAKE THREE TABLETS BY MOUTH ONCE DAILY (Patient taking differently: TAKE 4 TABLETS (80mg ) DAILY) 270 tablet 1  . levothyroxine (SYNTHROID, LEVOTHROID) 50 MCG tablet Take 1 tablet (50 mcg total) by mouth daily. 30 tablet 11  . LORazepam (ATIVAN) 2 MG tablet Take 1 tablet (2 mg total) by mouth every 6 (six) hours as needed for anxiety. 30  tablet 0  . losartan (COZAAR) 100 MG tablet TAKE 1 TABLET BY MOUTH ONCE DAILY (Patient taking differently: TAKE 100 MG BY MOUTH ONCE DAILY) 90 tablet 0  . metoprolol succinate (TOPROL-XL) 50 MG 24 hr tablet TAKE 100 MG BY MOUTH IN THE MORNING TAKE WITH OR IMMEDIATELY FOLLOWING A MEAL 60 tablet 5  . OXYGEN Inhale 3 L into the lungs continuous.     . potassium chloride SA (K-DUR,KLOR-CON) 20 MEQ tablet Take 1 tablet (20 mEq total) by mouth daily. 30 tablet 2  . QUEtiapine (SEROQUEL) 50 MG tablet Take 100 mg by mouth at bedtime.   3  . ranitidine (ZANTAC) 150 MG tablet Take 1 tablet (150 mg total) by mouth at bedtime. 30 tablet 3  . Tiotropium Bromide Monohydrate (SPIRIVA RESPIMAT) 2.5 MCG/ACT AERS Inhale 2 puffs into the lungs daily. 1 Inhaler 3  . zolpidem (AMBIEN) 5 MG tablet Take 2 tablets (10 mg total) by mouth at bedtime.     No current facility-administered medications for this visit.     Allergies:   Patient has no known allergies.    Social History:  The patient  reports that she quit smoking about 5 years ago. Her smoking use included Cigarettes. She started smoking about 44 years ago. She has a  40.00 pack-year smoking history. She has never used smokeless tobacco. She reports that she drinks alcohol. She reports that she does not use drugs.   Family History:  The patient's ***family history includes Asthma in her son and son; Bipolar disorder in her father; Depression in her father; Diabetes in her sister; Heart disease in her brother, father, paternal aunt, paternal grandmother, and paternal uncle; Heart disease (age of onset: 62) in her sister; Hyperlipidemia in her sister; Hypertension in her brother, father, and sister; Schizophrenia in her paternal aunt.    ROS:  General:no colds or fevers, no weight changes Skin:no rashes or ulcers HEENT:no blurred vision, no congestion CV:see HPI PUL:see HPI GI:no diarrhea constipation or melena, no indigestion GU:no hematuria, no  dysuria MS:no joint pain, no claudication Neuro:no syncope, no lightheadedness Endo:no diabetes, no thyroid disease Wt Readings from Last 3 Encounters:  05/11/17 (!) 300 lb 6.4 oz (136.3 kg)  05/02/17 296 lb 3.2 oz (134.4 kg)  04/06/17 296 lb 12.8 oz (134.6 kg)     PHYSICAL EXAM: VS:  There were no vitals taken for this visit. , BMI There is no height or weight on file to calculate BMI. General:Pleasant affect, NAD Skin:Warm and dry, brisk capillary refill HEENT:normocephalic, sclera clear, mucus membranes moist Neck:supple, no JVD, no bruits  Heart:S1S2 RRR without murmur, gallup, rub or click Lungs:clear without rales, rhonchi, or wheezes FXO:VANV, non tender, + BS, do not palpate liver spleen or masses Ext:no lower ext edema, 2+ pedal pulses, 2+ radial pulses Neuro:alert and oriented, MAE, follows commands, + facial symmetry    EKG:  EKG is ordered today. The ekg ordered today demonstrates ***   Recent Labs: 02/27/2017: TSH 4.31 05/01/2017: B Natriuretic Peptide 10.4 05/02/2017: ALT 13 05/11/2017: BUN 12; Creatinine, Ser 1.15; Hemoglobin 12.9; Platelets 167.0; Potassium 3.7; Sodium 136    Lipid Panel    Component Value Date/Time   CHOL 126 02/27/2017 1620   TRIG 77.0 02/27/2017 1620   HDL 30.30 (L) 02/27/2017 1620   CHOLHDL 4 02/27/2017 1620   VLDL 15.4 02/27/2017 1620   LDLCALC 81 02/27/2017 1620       Other studies Reviewed: Additional studies/ records that were reviewed today include: ***.   ASSESSMENT AND PLAN:  1.  ***   Current medicines are reviewed with the patient today.  The patient Has no concerns regarding medicines.  The following changes have been made:  See above Labs/ tests ordered today include:see above  Disposition:   FU:  see above  Signed, Cecilie Kicks, NP  06/01/2017 10:13 PM    [address], [place], [place] Phone: [phone]; Fax:  [phone]

## 2017-06-02 ENCOUNTER — Ambulatory Visit: Payer: Medicare HMO | Admitting: Cardiology

## 2017-06-05 ENCOUNTER — Encounter (HOSPITAL_BASED_OUTPATIENT_CLINIC_OR_DEPARTMENT_OTHER): Payer: Medicare HMO

## 2017-06-21 ENCOUNTER — Ambulatory Visit: Payer: Medicare HMO | Admitting: Pulmonary Disease

## 2017-07-04 ENCOUNTER — Ambulatory Visit: Payer: Medicare HMO | Admitting: Nurse Practitioner

## 2017-07-04 ENCOUNTER — Ambulatory Visit (HOSPITAL_BASED_OUTPATIENT_CLINIC_OR_DEPARTMENT_OTHER): Payer: Medicare HMO

## 2017-07-05 ENCOUNTER — Ambulatory Visit: Payer: Medicare HMO | Admitting: Nurse Practitioner

## 2017-07-11 ENCOUNTER — Telehealth: Payer: Self-pay | Admitting: *Deleted

## 2017-07-11 NOTE — Telephone Encounter (Signed)
Received Home Health Palliative Plan of Care; forwarded to provider with attached SAS envelope/SLS 10/30

## 2017-07-12 ENCOUNTER — Telehealth: Payer: Self-pay

## 2017-07-12 NOTE — Telephone Encounter (Signed)
Looking at pt medical history I think she would benefit from appointment in office. Benefit from cxr and cbc. This would determined how aggressive to be. Other option would be to pass request to her pcp tomorrow.  Pneumonia, chf, copd flare.??Pt on oxygen. In palliative care. Not hospice so recommend she be seen.  I would offer her 8 am tomorrow and 30 minute. If severe ill then ED.

## 2017-07-12 NOTE — Telephone Encounter (Signed)
Appointment scheduled for 07/13/17 at 9:30 am patient agreed.

## 2017-07-12 NOTE — Telephone Encounter (Signed)
Received call from [facility] with [facility]allative Care Providers. Patient has  HX Chronic Bronchitis and COPD. Currently coughing up yellowish with blood tinged sputum for past 3 weeks. Low grade fever, 2 + pitting edema.  Patient currently takes 60 mg Lasix. Please advise.

## 2017-07-13 ENCOUNTER — Ambulatory Visit: Payer: Medicare HMO | Admitting: Medical

## 2017-07-20 ENCOUNTER — Ambulatory Visit (HOSPITAL_BASED_OUTPATIENT_CLINIC_OR_DEPARTMENT_OTHER)
Admission: RE | Admit: 2017-07-20 | Discharge: 2017-07-20 | Disposition: A | Discharge status: Home / Self Care | Payer: Medicare HMO | Source: Ambulatory Visit | Attending: Family Medicine | Admitting: Family Medicine

## 2017-07-20 ENCOUNTER — Ambulatory Visit: Payer: Medicare HMO | Admitting: Family Medicine

## 2017-07-20 VITALS — BP 140/78 | HR 87 | Temp 98.1°F | Resp 20 | Ht 63.0 in | Wt 309.4 lb

## 2017-07-20 DIAGNOSIS — J9611 Chronic respiratory failure with hypoxia: Secondary | ICD-10-CM | POA: Insufficient documentation

## 2017-07-20 DIAGNOSIS — J209 Acute bronchitis, unspecified: Secondary | ICD-10-CM | POA: Diagnosis not present

## 2017-07-20 DIAGNOSIS — J440 Chronic obstructive pulmonary disease with acute lower respiratory infection: Secondary | ICD-10-CM | POA: Diagnosis not present

## 2017-07-20 DIAGNOSIS — J439 Emphysema, unspecified: Secondary | ICD-10-CM | POA: Insufficient documentation

## 2017-07-20 DIAGNOSIS — R05 Cough: Secondary | ICD-10-CM | POA: Diagnosis not present

## 2017-07-20 MED ORDER — AZITHROMYCIN 250 MG PO TABS: ORAL_TABLET | ORAL | 0 refills | Status: DC

## 2017-07-20 MED ORDER — IPRATROPIUM-ALBUTEROL 0.5-2.5 (3) MG/3ML IN SOLN
3.0000 mL | Freq: Once | RESPIRATORY_TRACT | Status: AC
  Administered 2017-07-20: 3 mL via RESPIRATORY_TRACT

## 2017-07-20 MED ORDER — PROMETHAZINE-DM 6.25-15 MG/5ML PO SYRP: 5.0000 mL | ORAL_SOLUTION | Freq: Four times a day (QID) | ORAL | 0 refills | Status: DC | PRN

## 2017-07-20 MED ORDER — METHYLPREDNISOLONE ACETATE 80 MG/ML IJ SUSP
80.0000 mg | Freq: Once | INTRAMUSCULAR | Status: AC
  Administered 2017-07-20: 80 mg via INTRAMUSCULAR

## 2017-07-20 MED ORDER — PREDNISONE 10 MG PO TABS: ORAL_TABLET | ORAL | 0 refills | Status: DC

## 2017-07-20 MED ORDER — IPRATROPIUM-ALBUTEROL 0.5-2.5 (3) MG/3ML IN SOLN: 3.0000 mL | Freq: Four times a day (QID) | RESPIRATORY_TRACT | Status: DC

## 2017-07-20 NOTE — Patient Instructions (Signed)

## 2017-07-20 NOTE — Progress Notes (Signed)
Patient ID: Sue [name], female   DOB: [birth year], 66 y.o.   MRN: [id number]    Subjective:  I acted as a Education administrator for Dr. Carollee Herter.  [name], [place]   Patient ID: Sue [name], female    DOB: 06-Jun-1951, 67 y.o.   MRN: [id number]  Chief Complaint  Patient presents with  . Cough    Cough  This is a new problem. Episode onset: 3 weeks. The cough is productive of sputum and productive of blood-tinged sputum (yellow). Associated symptoms include chills, headaches, hemoptysis, nasal congestion, postnasal drip, rhinorrhea, a sore throat, shortness of breath and wheezing. Pertinent negatives include no chest pain, ear congestion, ear pain, fever, heartburn, myalgias, sweats or weight loss. Treatments tried: mucinex, alka seltzer, robitussin. The treatment provided mild relief.    Patient is in today for cough.  Patient Care Team: Carollee Herter, Alferd Apa, DO as PCP - General (Family Medicine) Lelon Perla, MD as Referring Physician (Internal Medicine) Lelon Perla, MD as Referring Physician (Internal Medicine) Carollee Herter, Alferd Apa, DO (Family Medicine)   Past Medical History:  Diagnosis Date  . Allergic rhinitis   . Depression   . Emphysema of lung ([place])   . GERD (gastroesophageal reflux disease)   . Hypertension   . Hypothyroidism   . Stroke [facility]) 2016   TIA   . Urine incontinence     Past Surgical History:  Procedure Laterality Date  . ABDOMINAL HYSTERECTOMY    . CESAREAN SECTION      Family History  Problem Relation Age of Onset  . Heart disease Father        MVP and Pics Valve  . Hypertension Father   . Depression Father        Institutionalized x's 2 years  . Bipolar disorder Father   . Hypertension Sister   . Diabetes Sister   . Hyperlipidemia Sister   . Heart disease Sister 25       MI  . Heart disease Brother   . Hypertension Brother   . Heart disease Paternal Grandmother   . Heart disease Paternal Aunt   . Heart disease Paternal  Uncle   . Schizophrenia Paternal Aunt   . Asthma Son   . Asthma Son     Social History   Socioeconomic History  . Marital status: Divorced    Spouse name: Not on file  . Number of children: Not on file  . Years of education: Not on file  . Highest education level: Not on file  Social Needs  . Financial resource strain: Not on file  . Food insecurity - worry: Not on file  . Food insecurity - inability: Not on file  . Transportation needs - medical: Not on file  . Transportation needs - non-medical: Not on file  Occupational History  . Occupation: disabled- [employer]  Tobacco Use  . Smoking status: Former Smoker    Packs/day: 1.00    Years: 40.00    Pack years: 40.00    Types: Cigarettes    Start date: 07/21/1972    Last attempt to quit: 10/16/2011    Years since quitting: 5.7  . Smokeless tobacco: Never Used  . Tobacco comment: stopped for around 9 months during 1 pregnancy.  Substance and Sexual Activity  . Alcohol use: Yes    Comment: Occ-- Wine  . Drug use: No  . Sexual activity: Not on file  Other Topics Concern  . Not on file  Social  History Narrative   [facility] (04/06/17):   Originally from [place]. Has also lived in [place], [place]. She also previously lived in [place] for 20 years. No history of [place]. Moved to [place] in 1989. 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 No pets currently. No bird exposure. Questionable previous mold exposure in her daughter's home. 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     Outpatient Medications Prior to Visit  Medication Sig Dispense Refill  . albuterol (PROVENTIL HFA;VENTOLIN HFA) 108 (90 Base) MCG/ACT inhaler Inhale 1-2 puffs into the lungs every 6 (six) hours as needed for wheezing or shortness of breath.    Marland Kitchen  albuterol (PROVENTIL) (2.5 MG/3ML) 0.083% nebulizer solution Take 3 mLs (2.5 mg total) by nebulization every 6 (six) hours as needed for wheezing or shortness of breath. 150 mL 1  . aspirin EC 81 MG EC tablet Take 1 tablet (81 mg total) by mouth daily.    . fluticasone (FLONASE) 50 MCG/ACT nasal spray Place 2 sprays into both nostrils daily as needed for allergies.    . furosemide (LASIX) 20 MG tablet TAKE THREE TABLETS BY MOUTH ONCE DAILY (Patient taking differently: TAKE 4 TABLETS (80mg ) DAILY) 270 tablet 1  . levothyroxine (SYNTHROID, LEVOTHROID) 50 MCG tablet Take 1 tablet (50 mcg total) by mouth daily. 30 tablet 11  . LORazepam (ATIVAN) 2 MG tablet Take 1 tablet (2 mg total) by mouth every 6 (six) hours as needed for anxiety. 30 tablet 0  . losartan (COZAAR) 100 MG tablet TAKE 1 TABLET BY MOUTH ONCE DAILY (Patient taking differently: TAKE 100 MG BY MOUTH ONCE DAILY) 90 tablet 0  . metoprolol succinate (TOPROL-XL) 50 MG 24 hr tablet TAKE 100 MG BY MOUTH IN THE MORNING TAKE WITH OR IMMEDIATELY FOLLOWING A MEAL 60 tablet 5  . OXYGEN Inhale 3 L into the lungs continuous.     . potassium chloride SA (K-DUR,KLOR-CON) 20 MEQ tablet Take 1 tablet (20 mEq total) by mouth daily. 30 tablet 2  . QUEtiapine (SEROQUEL) 50 MG tablet Take 100 mg by mouth at bedtime.   3  . ranitidine (ZANTAC) 150 MG tablet Take 1 tablet (150 mg total) by mouth at bedtime. 30 tablet 3  . Tiotropium Bromide Monohydrate (SPIRIVA RESPIMAT) 2.5 MCG/ACT AERS Inhale 2 puffs into the lungs daily. 1 Inhaler 3  . zolpidem (AMBIEN) 5 MG tablet Take 2 tablets (10 mg total) by mouth at bedtime.     No facility-administered medications prior to visit.     No Known Allergies  Review of Systems  Constitutional: Positive for chills. Negative for fever and weight loss.  HENT: Positive for postnasal drip, rhinorrhea and sore throat. Negative for ear pain.   Respiratory: Positive for cough, hemoptysis, shortness of breath and wheezing.     Cardiovascular: Negative for chest pain.  Gastrointestinal: Negative for heartburn.  Musculoskeletal: Negative for myalgias.  Neurological: Positive for headaches.       Objective:    Physical Exam  Constitutional: She is oriented to person, place, and time. She appears well-developed and well-nourished.  HENT:  Right Ear: External ear normal.  Left Ear: External ear normal.  + PND + errythema  Eyes: Conjunctivae are normal. Right eye exhibits no discharge. Left eye exhibits no discharge.  Cardiovascular:  Normal rate, regular rhythm and normal heart sounds.  No murmur heard. Pulmonary/Chest: Effort normal. No respiratory distress. She has wheezes. She has no rales. She exhibits no tenderness.  Musculoskeletal: She exhibits no edema.  Lymphadenopathy:    She has cervical adenopathy.  Neurological: She is alert and oriented to person, place, and time.  Nursing note and vitals reviewed.   BP 140/78   Pulse 87   Temp 98.1 F (36.7 C) (Oral)   Resp 20   Ht 5\' 3"  (1.6 m)   Wt (!) 309 lb 6.4 oz (140.3 kg)   SpO2 93% Comment: 3 liters of o2  BMI 54.81 kg/m  Wt Readings from Last 3 Encounters:  07/20/17 (!) 309 lb 6.4 oz (140.3 kg)  05/11/17 (!) 300 lb 6.4 oz (136.3 kg)  05/02/17 296 lb 3.2 oz (134.4 kg)   BP Readings from Last 3 Encounters:  07/20/17 140/78  05/11/17 114/74  05/02/17 (!) 107/59     Immunization History  Administered Date(s) Administered  . Influenza,inj,Quad PF,6+ Mos 08/21/2014, 10/27/2015, 08/23/2016, 05/11/2017  . Influenza-Unspecified 06/12/2013  . Pneumococcal Conjugate-13 04/06/2017  . Pneumococcal Polysaccharide-23 09/12/2012  . Tdap 09/12/2009    Health Maintenance  Topic Date Due  . COLONOSCOPY  07/21/2002  . MAMMOGRAM  01/01/2015  . TETANUS/TDAP  09/13/2019  . INFLUENZA VACCINE  Completed  . Hepatitis C Screening  Completed  . HIV Screening  Completed    Lab Results  Component Value Date   WBC 5.3 05/11/2017   HGB 12.9  05/11/2017   HCT 39.3 05/11/2017   PLT 167.0 05/11/2017   GLUCOSE 136 (H) 05/11/2017   CHOL 126 02/27/2017   TRIG 77.0 02/27/2017   HDL 30.30 (L) 02/27/2017   LDLCALC 81 02/27/2017   ALT 13 (L) 05/02/2017   AST 25 05/02/2017   NA 136 05/11/2017   K 3.7 05/11/2017   CL 100 05/11/2017   CREATININE 1.15 05/11/2017   BUN 12 05/11/2017   CO2 32 05/11/2017   TSH 4.31 02/27/2017   INR 1.10 05/01/2017   HGBA1C 5.8 07/19/2016    Lab Results  Component Value Date   TSH 4.31 02/27/2017   Lab Results  Component Value Date   WBC 5.3 05/11/2017   HGB 12.9 05/11/2017   HCT 39.3 05/11/2017   MCV 95.9 05/11/2017   PLT 167.0 05/11/2017   Lab Results  Component Value Date   NA 136 05/11/2017   K 3.7 05/11/2017   CO2 32 05/11/2017   GLUCOSE 136 (H) 05/11/2017   BUN 12 05/11/2017   CREATININE 1.15 05/11/2017   BILITOT 0.7 05/02/2017   ALKPHOS 99 05/02/2017   AST 25 05/02/2017   ALT 13 (L) 05/02/2017   PROT 8.0 05/02/2017   ALBUMIN 3.0 (L) 05/02/2017   CALCIUM 9.6 05/11/2017   ANIONGAP 5 05/02/2017   GFR 60.94 05/11/2017   Lab Results  Component Value Date   CHOL 126 02/27/2017   Lab Results  Component Value Date   HDL 30.30 (L) 02/27/2017   Lab Results  Component Value Date   LDLCALC 81 02/27/2017   Lab Results  Component Value Date   TRIG 77.0 02/27/2017   Lab Results  Component Value Date   CHOLHDL 4 02/27/2017   Lab Results  Component Value Date   HGBA1C 5.8 07/19/2016         Assessment & Plan:   Problem List Items Addressed This Visit      Unprioritized   Chronic respiratory failure ([place])  Relevant Medications   azithromycin (ZITHROMAX Z-PAK) 250 MG tablet   predniSONE (DELTASONE) 10 MG tablet   Other Relevant Orders   DME Nebulizer machine   DG Chest 2 View (Completed)   Pulmonary emphysema ([facility])   Relevant Medications   azithromycin (ZITHROMAX Z-PAK) 250 MG tablet   promethazine-dextromethorphan (PROMETHAZINE-DM) 6.25-15 MG/5ML syrup    predniSONE (DELTASONE) 10 MG tablet   methylPREDNISolone acetate (DEPO-MEDROL) injection 80 mg (Completed)   ipratropium-albuterol (DUONEB) 0.5-2.5 (3) MG/3ML nebulizer solution 3 mL (Completed)   Other Relevant Orders   DME Nebulizer machine   DG Chest 2 View (Completed)    Other Visit Diagnoses    Acute bronchitis with COPD ([place])    -  Primary   Relevant Medications   azithromycin (ZITHROMAX Z-PAK) 250 MG tablet   promethazine-dextromethorphan (PROMETHAZINE-DM) 6.25-15 MG/5ML syrup   predniSONE (DELTASONE) 10 MG tablet   methylPREDNISolone acetate (DEPO-MEDROL) injection 80 mg (Completed)   ipratropium-albuterol (DUONEB) 0.5-2.5 (3) MG/3ML nebulizer solution 3 mL (Completed)    if symptoms worsen call office or go to ER  I am having Sue [name] start on azithromycin, promethazine-dextromethorphan, and predniSONE. I am also having her maintain her OXYGEN, QUEtiapine, levothyroxine, losartan, LORazepam, furosemide, albuterol, potassium chloride SA, albuterol, ranitidine, aspirin, fluticasone, metoprolol succinate, Tiotropium Bromide Monohydrate, and zolpidem. We administered methylPREDNISolone acetate and ipratropium-albuterol.  Meds ordered this encounter  Medications  . azithromycin (ZITHROMAX Z-PAK) 250 MG tablet    Sig: As directed    Dispense:  6 each    Refill:  0  . promethazine-dextromethorphan (PROMETHAZINE-DM) 6.25-15 MG/5ML syrup    Sig: Take 5 mLs 4 (four) times daily as needed by mouth.    Dispense:  118 mL    Refill:  0  . predniSONE (DELTASONE) 10 MG tablet    Sig: TAKE 3 TABLETS PO QD FOR 3 DAYS THEN TAKE 2 TABLETS PO QD FOR 3 DAYS THEN TAKE 1 TABLET PO QD FOR 3 DAYS THEN TAKE 1/2 TAB PO QD FOR 3 DAYS    Dispense:  20 tablet    Refill:  0  . DISCONTD: ipratropium-albuterol (DUONEB) 0.5-2.5 (3) MG/3ML nebulizer solution 3 mL  . methylPREDNISolone acetate (DEPO-MEDROL) injection 80 mg  . ipratropium-albuterol (DUONEB) 0.5-2.5 (3) MG/3ML nebulizer solution 3 mL     CMA served as scribe during this visit. History, Physical and Plan performed by medical provider. Documentation and orders reviewed and attested to.  Ann Held, DO

## 2017-07-21 ENCOUNTER — Encounter: Payer: Self-pay | Admitting: Family Medicine

## 2017-07-25 ENCOUNTER — Other Ambulatory Visit: Payer: Self-pay | Admitting: Family Medicine

## 2017-07-25 DIAGNOSIS — J449 Chronic obstructive pulmonary disease, unspecified: Secondary | ICD-10-CM | POA: Diagnosis not present

## 2017-07-25 DIAGNOSIS — I10 Essential (primary) hypertension: Secondary | ICD-10-CM

## 2017-07-25 DIAGNOSIS — J9601 Acute respiratory failure with hypoxia: Secondary | ICD-10-CM | POA: Diagnosis not present

## 2017-07-25 DIAGNOSIS — J439 Emphysema, unspecified: Secondary | ICD-10-CM | POA: Diagnosis not present

## 2017-07-31 ENCOUNTER — Encounter: Payer: Self-pay | Admitting: Nurse Practitioner

## 2017-07-31 ENCOUNTER — Ambulatory Visit (INDEPENDENT_AMBULATORY_CARE_PROVIDER_SITE_OTHER): Payer: Medicare HMO | Admitting: Nurse Practitioner

## 2017-07-31 VITALS — BP 120/80 | HR 82 | Ht 63.0 in | Wt 303.0 lb

## 2017-07-31 DIAGNOSIS — J438 Other emphysema: Secondary | ICD-10-CM

## 2017-07-31 DIAGNOSIS — R0609 Other forms of dyspnea: Secondary | ICD-10-CM

## 2017-07-31 DIAGNOSIS — R0600 Dyspnea, unspecified: Secondary | ICD-10-CM

## 2017-07-31 NOTE — Patient Instructions (Addendum)
We will be checking the following labs today - NONE   Medication Instructions:    Continue with your current medicines.     Testing/Procedures To Be Arranged:  N/A  Follow-Up:   See Dr. Marlou Porch in 4 to 6 months.     Other Special Instructions:   Think about what we talked about today.     If you need a refill on your cardiac medications before your next appointment, please call your pharmacy.   Call the [place] office at [phone] if you have any questions, problems or concerns.

## 2017-07-31 NOTE — Progress Notes (Signed)
CARDIOLOGY OFFICE NOTE  Date:  07/31/2017    Sue [name] Date of Birth: May 28, 1951 Medical Record #[id number]  PCP:  Ann Held, DO  Cardiologist:  [facility]   Chief Complaint  Patient presents with  . Shortness of Breath    Post hospital visit - seen for Dr. Marlou Porch    History of Present Illness: Sue [name] is a 66 y.o. female who presents today for a post hospital visit (from August). Seen for Dr. Marlou Porch.   She has a history of GERD, emphysema, PVCs, depression, HTN, hypothyroidismprior stroke, & hypertension.   Referred here back in July for evaluation of heart failure at the request of Dr.Lowne. Echo was obtained - this showed normal EF and grade 1 DD. Weight loss recommended.   Hospitalized back in August with neck pain/numbness with radiation to the left arm and associated chest heaviness. Noted that her symptoms resolved with diuresis. Troponins were negative. Was asked to follow up here.   Comes in today. Here alone. Has oxygen in place. She does not drive. Hard for her to arrange transportation. She says she is doing better. Just got over bronchitis. "Almost" back to her baseline. Some cough and still using inhalers. She remains on oxygen. Pretty sedentary as a general rule. No more chest pain/neck discomfort. Still with some occasional palpitations - feels like she has trigeminy at times - thinks this may be getting worse. Her palpitations are not necessarily related to any chest pain.  Says she is not using caffeine. She admits she is using too much sodium. Eats out most meals. Family brings her take out as well. She is not able to exercise.   Past Medical History:  Diagnosis Date  . Allergic rhinitis   . Depression   . Emphysema of lung ([place])   . GERD (gastroesophageal reflux disease)   . Hypertension   . Hypothyroidism   . Stroke [facility]) 2016   TIA   . Urine incontinence     Past Surgical History:  Procedure Laterality Date  . ABDOMINAL  HYSTERECTOMY    . CESAREAN SECTION       Medications: Current Meds  Medication Sig  . albuterol (PROVENTIL HFA;VENTOLIN HFA) 108 (90 Base) MCG/ACT inhaler Inhale 1-2 puffs into the lungs every 6 (six) hours as needed for wheezing or shortness of breath.  Marland Kitchen albuterol (PROVENTIL) (2.5 MG/3ML) 0.083% nebulizer solution Take 3 mLs (2.5 mg total) by nebulization every 6 (six) hours as needed for wheezing or shortness of breath.  Marland Kitchen aspirin EC 81 MG EC tablet Take 1 tablet (81 mg total) by mouth daily.  . busPIRone (BUSPAR) 15 MG tablet Take one half tablet (7.5 mg ) daily  . fluticasone (FLONASE) 50 MCG/ACT nasal spray Place 2 sprays into both nostrils daily as needed for allergies.  . furosemide (LASIX) 20 MG tablet Take 60 mg daily by mouth.  . levothyroxine (SYNTHROID, LEVOTHROID) 50 MCG tablet Take 1 tablet (50 mcg total) by mouth daily.  Marland Kitchen LORazepam (ATIVAN) 2 MG tablet Take 1 tablet (2 mg total) by mouth every 6 (six) hours as needed for anxiety.  Marland Kitchen losartan (COZAAR) 100 MG tablet TAKE 1 TABLET BY MOUTH ONCE DAILY  . metoprolol succinate (TOPROL-XL) 50 MG 24 hr tablet TAKE 2 TABLETS BY MOUTH IN THE MORNING WITH OR IMMEDIATELY FOLLOWING MEAL  . OXYGEN Inhale 3 L into the lungs continuous.   . potassium chloride SA (K-DUR,KLOR-CON) 20 MEQ tablet Take 1 tablet (20 mEq  total) by mouth daily.  . QUEtiapine (SEROQUEL) 50 MG tablet Take 100 mg by mouth at bedtime.   . ranitidine (ZANTAC) 150 MG tablet Take 1 tablet (150 mg total) by mouth at bedtime.  . Tiotropium Bromide Monohydrate (SPIRIVA RESPIMAT) 2.5 MCG/ACT AERS Inhale 2 puffs into the lungs daily.  Marland Kitchen zolpidem (AMBIEN) 5 MG tablet Take 2 tablets (10 mg total) by mouth at bedtime.     Allergies: No Known Allergies  Social History: The patient  reports that she quit smoking about 5 years ago. Her smoking use included cigarettes. She started smoking about 45 years ago. She has a 40.00 pack-year smoking history. she has never used  smokeless tobacco. She reports that she drinks alcohol. She reports that she does not use drugs.   Family History: The patient's family history includes Asthma in her son and son; Bipolar disorder in her father; Depression in her father; Diabetes in her sister; Heart disease in her brother, father, paternal aunt, paternal grandmother, and paternal uncle; Heart disease (age of onset: 52) in her sister; Hyperlipidemia in her sister; Hypertension in her brother, father, and sister; Schizophrenia in her paternal aunt.   Review of Systems: Please see the history of present illness.   Otherwise, the review of systems is positive for none.   All other systems are reviewed and negative.   Physical Exam: VS:  BP 120/80 (BP Location: Left Arm, Patient Position: Sitting, Cuff Size: Large)   Pulse 82   Ht 5\' 3"  (1.6 m)   Wt (!) 303 lb (137.4 kg)   SpO2 95% Comment: on 3 L  BMI 53.67 kg/m  .  BMI Body mass index is 53.67 kg/m.  Wt Readings from Last 3 Encounters:  07/31/17 (!) 303 lb (137.4 kg)  07/20/17 (!) 309 lb 6.4 oz (140.3 kg)  05/11/17 (!) 300 lb 6.4 oz (136.3 kg)    General: Pleasant. Morbidly obese black female. Has oxygen in place. Alert and in no acute distress.   HEENT: Normal.  Neck: Supple, no JVD, carotid bruits, or masses noted.  Cardiac: Regular rate and rhythm. Heart tones are distant due to her body habitus. Legs are quite full with chronic edema.  Respiratory:  Lungs with decreased breath sounds due to body habitus but with normal work of breathing.  GI: Soft and nontender.  MS: No deformity or atrophy. Gait and ROM intact.  Skin: Warm and dry. Color is normal.  Neuro:  Strength and sensation are intact and no gross focal deficits noted.  Psych: Alert, appropriate and with normal affect.   LABORATORY DATA:  EKG:  EKG is not ordered today.  Lab Results  Component Value Date   WBC 5.3 05/11/2017   HGB 12.9 05/11/2017   HCT 39.3 05/11/2017   PLT 167.0 05/11/2017    GLUCOSE 136 (H) 05/11/2017   CHOL 126 02/27/2017   TRIG 77.0 02/27/2017   HDL 30.30 (L) 02/27/2017   LDLCALC 81 02/27/2017   ALT 13 (L) 05/02/2017   AST 25 05/02/2017   NA 136 05/11/2017   K 3.7 05/11/2017   CL 100 05/11/2017   CREATININE 1.15 05/11/2017   BUN 12 05/11/2017   CO2 32 05/11/2017   TSH 4.31 02/27/2017   INR 1.10 05/01/2017   HGBA1C 5.8 07/19/2016     BNP (last 3 results) Recent Labs    03/25/17 2335 05/01/17 1532  BNP 15.3 10.4    ProBNP (last 3 results) No results for input(s): PROBNP in the last 8760  hours.   Other Studies Reviewed Today:  Echo Study Conclusions 03/2017  - Left ventricle: Proximal septal thickening The cavity size was   normal. Wall thickness was increased in a pattern of mild LVH.   Systolic function was vigorous. The estimated ejection fraction   was in the range of 65% to 70%. Doppler parameters are consistent   with abnormal left ventricular relaxation (grade 1 diastolic   dysfunction).   Assessment/Plan:  1. Shortness of breath - multifactorial - getting too much salt. Obesity playing a role as well. Has COPD.    2. Morbid obesity - I think this is the crux of her issues.   3. COPD - on oxygen  4. Prior episode of neck/chest pain - improved with diuresis - has not recurred. Would favor continued plan of care. Could consider stress testing but I think her weight will make this challenging. Needs CV risk factor modification regardless.   5. Chronic palpitations - on beta blocker - not wanting to increase her medicines.    Current medicines are reviewed with the patient today.  The patient does not have concerns regarding medicines other than what has been noted above.  The following changes have been made:  See above.  Labs/ tests ordered today include:   No orders of the defined types were placed in this encounter.    Disposition:   FU with Dr. Marlou Porch as planned. I think her overall prognosis is tenuous at best.     Patient is agreeable to this plan and will call if any problems develop in the interim.   SignedTruitt Merle, NP  07/31/2017 9:23 AM  [facility] [address] [place], [place]  [address] Phone: [phone] Fax: [phone]

## 2017-08-01 ENCOUNTER — Encounter (HOSPITAL_BASED_OUTPATIENT_CLINIC_OR_DEPARTMENT_OTHER): Payer: Medicare HMO

## 2017-08-16 ENCOUNTER — Other Ambulatory Visit: Payer: Medicare HMO

## 2017-08-18 ENCOUNTER — Other Ambulatory Visit: Payer: Self-pay | Admitting: Family Medicine

## 2017-08-18 DIAGNOSIS — R600 Localized edema: Secondary | ICD-10-CM

## 2017-08-21 ENCOUNTER — Ambulatory Visit: Payer: Medicare HMO

## 2017-08-23 ENCOUNTER — Ambulatory Visit: Payer: Medicare HMO

## 2017-08-24 DIAGNOSIS — J449 Chronic obstructive pulmonary disease, unspecified: Secondary | ICD-10-CM | POA: Diagnosis not present

## 2017-08-24 DIAGNOSIS — J439 Emphysema, unspecified: Secondary | ICD-10-CM | POA: Diagnosis not present

## 2017-08-24 DIAGNOSIS — J9601 Acute respiratory failure with hypoxia: Secondary | ICD-10-CM | POA: Diagnosis not present

## 2017-09-07 ENCOUNTER — Other Ambulatory Visit: Payer: Self-pay | Admitting: Adult Health

## 2017-09-07 MED ORDER — RANITIDINE HCL 150 MG PO TABS: 150.0000 mg | ORAL_TABLET | Freq: Every day | ORAL | 3 refills | Status: DC

## 2017-09-13 ENCOUNTER — Encounter (HOSPITAL_BASED_OUTPATIENT_CLINIC_OR_DEPARTMENT_OTHER): Payer: Medicare HMO

## 2017-09-22 ENCOUNTER — Other Ambulatory Visit: Payer: Self-pay | Admitting: Family Medicine

## 2017-09-22 DIAGNOSIS — I10 Essential (primary) hypertension: Secondary | ICD-10-CM

## 2017-09-24 DIAGNOSIS — J9601 Acute respiratory failure with hypoxia: Secondary | ICD-10-CM | POA: Diagnosis not present

## 2017-09-24 DIAGNOSIS — J439 Emphysema, unspecified: Secondary | ICD-10-CM | POA: Diagnosis not present

## 2017-09-24 DIAGNOSIS — J449 Chronic obstructive pulmonary disease, unspecified: Secondary | ICD-10-CM | POA: Diagnosis not present

## 2017-09-27 ENCOUNTER — Telehealth: Payer: Self-pay | Admitting: *Deleted

## 2017-09-27 NOTE — Telephone Encounter (Signed)
Received Physician Orders via mail from [place], program of [facility]; forwarded to provider/SLS 01/16

## 2017-10-03 ENCOUNTER — Other Ambulatory Visit: Payer: Self-pay | Admitting: Family Medicine

## 2017-10-03 DIAGNOSIS — E039 Hypothyroidism, unspecified: Secondary | ICD-10-CM

## 2017-10-03 DIAGNOSIS — F319 Bipolar disorder, unspecified: Secondary | ICD-10-CM | POA: Diagnosis not present

## 2017-10-10 ENCOUNTER — Other Ambulatory Visit: Payer: Self-pay | Admitting: Family Medicine

## 2017-10-10 DIAGNOSIS — E876 Hypokalemia: Secondary | ICD-10-CM

## 2017-10-16 ENCOUNTER — Encounter: Payer: Self-pay | Admitting: Family Medicine

## 2017-10-16 ENCOUNTER — Ambulatory Visit (INDEPENDENT_AMBULATORY_CARE_PROVIDER_SITE_OTHER): Payer: Medicare HMO | Admitting: Family Medicine

## 2017-10-16 VITALS — BP 113/60 | HR 92 | Temp 98.4°F | Resp 18 | Ht 63.0 in | Wt 314.0 lb

## 2017-10-16 DIAGNOSIS — J40 Bronchitis, not specified as acute or chronic: Secondary | ICD-10-CM | POA: Diagnosis not present

## 2017-10-16 DIAGNOSIS — R52 Pain, unspecified: Secondary | ICD-10-CM

## 2017-10-16 LAB — POCT INFLUENZA A/B
Influenza A, POC: NEGATIVE
Influenza B, POC: NEGATIVE

## 2017-10-16 MED ORDER — AZITHROMYCIN 250 MG PO TABS: ORAL_TABLET | ORAL | 0 refills | Status: DC

## 2017-10-16 NOTE — Progress Notes (Signed)
Chief Complaint  Patient presents with  . Nasal Congestion    Complains of nasal and chest congestion with productive cough, fever, body aches.     Sue [name] here for URI complaints.  Duration: 4 days  Associated symptoms: subjective fever, sinus congestion, rhinorrhea, sore throat, myalgia and cough Denies: itchy watery eyes, ear pain and ear drainage Treatment to date: Alka Seltzer +, Mucinex, Tussin, honey, lemon tea Sick contacts: Yes- son/daughter in law, 5 grandkids Pt states she is 10% better.   ROS:  Const: Denies fevers  HEENT: As noted in HPI Lungs: No new SOB  Past Medical History:  Diagnosis Date  . Allergic rhinitis   . Depression   . Emphysema of lung ([place])   . GERD (gastroesophageal reflux disease)   . Hypertension   . Hypothyroidism   . Stroke [facility]) 2016   TIA   . Urine incontinence    Family History  Problem Relation Age of Onset  . Heart disease Father        MVP and Pics Valve  . Hypertension Father   . Depression Father        Institutionalized x's 2 years  . Bipolar disorder Father   . Hypertension Sister   . Diabetes Sister   . Hyperlipidemia Sister   . Heart disease Sister 60       MI  . Heart disease Brother   . Hypertension Brother   . Heart disease Paternal Grandmother   . Heart disease Paternal Aunt   . Heart disease Paternal Uncle   . Schizophrenia Paternal Aunt   . Asthma Son   . Asthma Son     BP 113/60 (BP Location: Left Arm, Patient Position: Sitting, Cuff Size: Large)   Pulse 92   Temp 98.4 F (36.9 C) (Oral)   Resp 18   Ht 5\' 3"  (1.6 m)   Wt (!) 314 lb (142.4 kg)   SpO2 94%   BMI 55.62 kg/m  General: Awake, alert, appears stated age HEENT: AT, [place], ears patent b/l and TM's neg, nares patent w/o discharge, pharynx pink and without exudates, MMM Neck: No masses or asymmetry Heart: RRR Lungs: CTAB, no accessory muscle use Psych: Age appropriate judgment and insight, normal mood and affect  Bronchitis - Plan:  azithromycin (ZITHROMAX) 250 MG tablet  Body aches - Plan: POCT Influenza A/B  Orders as above. Abx called in, do not take right away. If she continues to get better, cont supportive care.  Ibuprofen/Tylenol for muscle aches. Sounds like the flu.  Continue to push fluids, practice good hand hygiene, cover mouth when coughing. F/u prn. If starting to experience fevers, shaking, or shortness of breath, seek immediate care. Pt voiced understanding and agreement to the plan.  [place], DO 10/16/17 1:56 PM

## 2017-10-16 NOTE — Patient Instructions (Signed)
Don't take the antibiotic right away. If you continue to get better, don't take the antibiotic.   Continue to push fluids, practice good hand hygiene, and cover your mouth if you cough.  If you start having fevers, shaking or shortness of breath, seek immediate care.  OK to take Tylenol 1000 mg (2 extra strength tabs) or 975 mg (3 regular strength tabs) every 6 hours as needed.  Ibuprofen 400-600 mg (2-3 over the counter strength tabs) every 6 hours as needed for pain.  Let us know if you need anything.

## 2017-10-18 ENCOUNTER — Ambulatory Visit: Payer: Self-pay | Admitting: *Deleted

## 2017-10-18 NOTE — Telephone Encounter (Signed)
Patient is calling to report she is not better- she started the antibiotic yesterday. She reports her sinus congestion is moving into her chest and the productive cough she had is  becoming non productive. She is having trouble catching her breath after coughing fits and she is requesting cough medication to help with that. She also reports increased swelling in her ankles. Per protocol- she should be seen- but she declines to make an appointment due to transportation. Her daughter has sick children and is taking them to the doctor today. She is requesting cough medication be sent to her [place]. I told patient she may be contacted to come to the office- her contact is [phone]. Reason for Disposition . [1] Ankle swelling AND [2] swelling is increasing  Answer Assessment - Initial Assessment Questions 1. ONSET: "When did the cough begin?"      Last Friday- was productive but now - patient feels that her sinus congestion has moved into the bronchia  2. SEVERITY: "How bad is the cough today?"      Laying down- she feels a choking sensation and sharp pain under L breast 3. RESPIRATORY DISTRESS: "Describe your breathing."      Yes- just when coughing- patient has trouble catching air 4. FEVER: "Do you have a fever?" If so, ask: "What is your temperature, how was it measured, and when did it start?"     Thursday and Friday she had fever. 5. HEMOPTYSIS: "Are you coughing up any blood?" If so ask: "How much?" (flecks, streaks, tablespoons, etc.)     Yes there was- but it was from nasal passages 6. TREATMENT: "What have you done so far to treat the cough?" (e.g., meds, fluids, humidifier)     Antibiotic,inhalers- albuterol  7. CARDIAC HISTORY: "Do you have any history of heart disease?" (e.g., heart attack, congestive heart failure)      no 8. LUNG HISTORY: "Do you have any history of lung disease?"  (e.g., pulmonary embolus, asthma, emphysema)     Emphysema, COPD, chronic bronchitis uses  O2 9. PE RISK FACTORS: "Do you have a history of blood clots?" (or: recent major surgery, recent prolonged travel, bedridden )     no 10. OTHER SYMPTOMS: "Do you have any other symptoms? (e.g., runny nose, wheezing, chest pain)       Runny nose,feet swollen twice size- happened yesterday 11. PREGNANCY: "Is there any chance you are pregnant?" "When was your last menstrual period?"       n/a 12. TRAVEL: "Have you traveled out of the country in the last month?" (e.g., travel history, exposures)       no  Protocols used: COUGH - ACUTE NON-PRODUCTIVE-A-AH

## 2017-10-18 NOTE — Telephone Encounter (Signed)
Per Dr. Nani Ravens patient needs to be seen. Patient said she can not come in due to transportation. Advised per Dr. Viona Gilmore to continue antibiotic and if she is not better in 24 to 48 hours to seek medical attention. Patient voices understanding.

## 2017-10-19 ENCOUNTER — Ambulatory Visit (INDEPENDENT_AMBULATORY_CARE_PROVIDER_SITE_OTHER): Payer: Medicare HMO | Admitting: Family Medicine

## 2017-10-19 ENCOUNTER — Encounter: Payer: Self-pay | Admitting: Family Medicine

## 2017-10-19 ENCOUNTER — Ambulatory Visit (HOSPITAL_BASED_OUTPATIENT_CLINIC_OR_DEPARTMENT_OTHER)
Admission: RE | Admit: 2017-10-19 | Discharge: 2017-10-19 | Disposition: A | Discharge status: Home / Self Care | Payer: Medicare HMO | Source: Ambulatory Visit | Attending: Family Medicine | Admitting: Family Medicine

## 2017-10-19 ENCOUNTER — Ambulatory Visit: Payer: Self-pay

## 2017-10-19 VITALS — BP 137/76 | HR 94 | Temp 98.2°F | Resp 16 | Ht 63.0 in | Wt 311.8 lb

## 2017-10-19 DIAGNOSIS — R600 Localized edema: Secondary | ICD-10-CM

## 2017-10-19 DIAGNOSIS — J439 Emphysema, unspecified: Secondary | ICD-10-CM | POA: Diagnosis not present

## 2017-10-19 DIAGNOSIS — R918 Other nonspecific abnormal finding of lung field: Secondary | ICD-10-CM | POA: Diagnosis not present

## 2017-10-19 DIAGNOSIS — J440 Chronic obstructive pulmonary disease with acute lower respiratory infection: Secondary | ICD-10-CM

## 2017-10-19 DIAGNOSIS — J209 Acute bronchitis, unspecified: Secondary | ICD-10-CM | POA: Diagnosis not present

## 2017-10-19 DIAGNOSIS — R05 Cough: Secondary | ICD-10-CM | POA: Insufficient documentation

## 2017-10-19 DIAGNOSIS — R062 Wheezing: Secondary | ICD-10-CM | POA: Diagnosis not present

## 2017-10-19 MED ORDER — FUROSEMIDE 40 MG PO TABS: 80.0000 mg | ORAL_TABLET | Freq: Every day | ORAL | 4 refills | Status: DC

## 2017-10-19 MED ORDER — ALBUTEROL SULFATE (2.5 MG/3ML) 0.083% IN NEBU
2.5000 mg | INHALATION_SOLUTION | Freq: Once | RESPIRATORY_TRACT | Status: AC
  Administered 2017-10-19: 2.5 mg via RESPIRATORY_TRACT

## 2017-10-19 MED ORDER — PREDNISONE 10 MG PO TABS: ORAL_TABLET | ORAL | 0 refills | Status: DC

## 2017-10-19 MED ORDER — LEVOFLOXACIN 500 MG PO TABS: 500.0000 mg | ORAL_TABLET | Freq: Every day | ORAL | 0 refills | Status: DC

## 2017-10-19 MED ORDER — METHYLPREDNISOLONE ACETATE 80 MG/ML IJ SUSP
80.0000 mg | Freq: Once | INTRAMUSCULAR | Status: AC
  Administered 2017-10-19: 80 mg via INTRAMUSCULAR

## 2017-10-19 MED FILL — FUROSEMIDE 40 MG TAB: 40 | 90 days supply | Qty: 180 | Fill #0

## 2017-10-19 MED FILL — predniSONE 10 MG TABS: 10 | 12 days supply | Qty: 20 | Fill #0

## 2017-10-19 MED FILL — levoFLOXacin 500 MG TABS: 500 | 10 days supply | Qty: 10 | Fill #0

## 2017-10-19 NOTE — Telephone Encounter (Signed)
Pt. Is still feeling bad with cough,congestion.Has some edema to lower extremities today. Has transportation today for office visit. Appointment made. Also has pain behind her right breast.  Reason for Disposition . SEVERE coughing spells (e.g., whooping sound after coughing, vomiting after coughing)  Answer Assessment - Initial Assessment Questions 1. ONSET: "When did the cough begin?"      Started Monday - getting worse 2. SEVERITY: "How bad is the cough today?"      Severe - using O2 and nebulizer 3. RESPIRATORY DISTRESS: "Describe your breathing."      O2 Sat 95% 4. FEVER: "Do you have a fever?" If so, ask: "What is your temperature, how was it measured, and when did it start?"     No 5. SPUTUM: "Describe the color of your sputum" (clear, white, yellow, green)     White,frothy 6. HEMOPTYSIS: "Are you coughing up any blood?" If so ask: "How much?" (flecks, streaks, tablespoons, etc.)     No 7. CARDIAC HISTORY: "Do you have any history of heart disease?" (e.g., heart attack, congestive heart failure)      CHF 8. LUNG HISTORY: "Do you have any history of lung disease?"  (e.g., pulmonary embolus, asthma, emphysema)     COPD 9. PE RISK FACTORS: "Do you have a history of blood clots?" (or: recent major surgery, recent prolonged travel, bedridden )     No 10. OTHER SYMPTOMS: "Do you have any other symptoms?" (e.g., runny nose, wheezing, chest pain)       Headache, pitting edema 11. PREGNANCY: "Is there any chance you are pregnant?" "When was your last menstrual period?"       No 12. TRAVEL: "Have you traveled out of the country in the last month?" (e.g., travel history, exposures)       No  Protocols used: [place]

## 2017-10-19 NOTE — Assessment & Plan Note (Signed)
Inc lasix to 80 mg  Elevate legs Check labs rto 2-3 weeks

## 2017-10-19 NOTE — Progress Notes (Signed)
Patient ID: Sue [name], female   DOB: 10-May-1951, 67 y.o.   MRN: [id number]    Subjective:  I acted as a Education administrator for Dr. Carollee Herter.  Sue [name], Sue [name]   Patient ID: Sue [name], female    DOB: 10/23/1950, 67 y.o.   MRN: [id number]    HPI  Patient is in today for bronchitis, swelling in both feet, and pain under left breast.   Patient Care Team: Carollee Herter, Alferd Apa, DO as PCP - General (Family Medicine) Lelon Perla, MD as Referring Physician (Internal Medicine) Lelon Perla, MD as Referring Physician (Internal Medicine) Carollee Herter, Alferd Apa, DO (Family Medicine)   Past Medical History:  Diagnosis Date  . Allergic rhinitis   . Depression   . Emphysema of lung ([place])   . GERD (gastroesophageal reflux disease)   . Hypertension   . Hypothyroidism   . Stroke [facility]) 2016   TIA   . Urine incontinence     Past Surgical History:  Procedure Laterality Date  . ABDOMINAL HYSTERECTOMY    . CESAREAN SECTION      Family History  Problem Relation Age of Onset  . Heart disease Father        MVP and Pics Valve  . Hypertension Father   . Depression Father        Institutionalized x's 2 years  . Bipolar disorder Father   . Hypertension Sister   . Diabetes Sister   . Hyperlipidemia Sister   . Heart disease Sister 38       MI  . Heart disease Brother   . Hypertension Brother   . Heart disease Paternal Grandmother   . Heart disease Paternal Aunt   . Heart disease Paternal Uncle   . Schizophrenia Paternal Aunt   . Asthma Son   . Asthma Son     Social History   Socioeconomic History  . Marital status: Divorced    Spouse name: Not on file  . Number of children: Not on file  . Years of education: Not on file  . Highest education level: Not on file  Social Needs  . Financial resource strain: Not on file  . Food insecurity - worry: Not on file  . Food insecurity - inability: Not on file  . Transportation needs - medical: Not on file  .  Transportation needs - non-medical: Not on file  Occupational History  . Occupation: disabled- [employer]  Tobacco Use  . Smoking status: Former Smoker    Packs/day: 1.00    Years: 40.00    Pack years: 40.00    Types: Cigarettes    Start date: 07/21/1972    Last attempt to quit: 10/16/2011    Years since quitting: 6.0  . Smokeless tobacco: Never Used  . Tobacco comment: stopped for around 9 months during 1 pregnancy.  Substance and Sexual Activity  . Alcohol use: Yes    Comment: Occ-- Wine  . Drug use: No  . Sexual activity: Not on file  Other Topics Concern  . Not on file  Social History Narrative   [facility] (04/06/17):   Originally from [place]. Has also lived in [place], [place]. She also previously lived in [place] for 20 years. No history of [place]. Moved to [place] in 1989. 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 No pets currently. No bird exposure. Questionable previous mold exposure in her daughter's home. 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     Outpatient Medications Prior to Visit  Medication Sig Dispense Refill  . albuterol (PROVENTIL HFA;VENTOLIN HFA) 108 (90 Base) MCG/ACT inhaler Inhale 1-2 puffs into the lungs every 6 (six) hours as needed for wheezing or shortness of breath.    Marland Kitchen albuterol (PROVENTIL) (2.5 MG/3ML) 0.083% nebulizer solution Take 3 mLs (2.5 mg total) by nebulization every 6 (six) hours as needed for wheezing or shortness of breath. 150 mL 1  . aspirin EC 81 MG EC tablet Take 1 tablet (81 mg total) by mouth daily.    Marland Kitchen azithromycin (ZITHROMAX) 250 MG tablet Take 2 tabs the first day and then 1 tab daily until you run out. 6 tablet 0  . fluticasone (FLONASE) 50 MCG/ACT nasal spray Place 2 sprays into both nostrils daily as needed for allergies.     . furosemide (LASIX) 20 MG tablet TAKE 3 TABLETS BY MOUTH ONCE DAILY 270 tablet 1  . KLOR-CON M20 20 MEQ tablet TAKE 1 TABLET BY MOUTH ONCE DAILY 30 tablet 2  . levothyroxine (SYNTHROID, LEVOTHROID) 50 MCG tablet TAKE ONE TABLET BY MOUTH ONCE DAILY 30 tablet 11  . LORazepam (ATIVAN) 2 MG tablet Take 1 tablet (2 mg total) by mouth every 6 (six) hours as needed for anxiety. 30 tablet 0  . losartan (COZAAR) 100 MG tablet TAKE 1 TABLET BY MOUTH ONCE DAILY 90 tablet 0  . metoprolol succinate (TOPROL-XL) 50 MG 24 hr tablet TAKE 2 TABLETS BY MOUTH IN THE MORNING WITH OR IMMEDIATELY FOLLOWING MEAL 180 tablet 1  . OXYGEN Inhale 3 L into the lungs continuous.     Marland Kitchen QUEtiapine (SEROQUEL) 50 MG tablet Take 100 mg by mouth at bedtime.   3  . ranitidine (ZANTAC) 150 MG tablet Take 1 tablet (150 mg total) by mouth at bedtime. 30 tablet 3  . Tiotropium Bromide Monohydrate (SPIRIVA RESPIMAT) 2.5 MCG/ACT AERS Inhale 2 puffs into the lungs daily. 1 Inhaler 3  . zolpidem (AMBIEN) 5 MG tablet Take 2 tablets (10 mg total) by mouth at bedtime.     No facility-administered medications prior to visit.     No Known Allergies  Review of Systems  Constitutional: Negative for chills, fever and malaise/fatigue.  HENT: Negative for congestion and hearing loss.   Eyes: Negative for discharge.  Respiratory: Positive for cough, sputum production, shortness of breath and wheezing.   Cardiovascular: Positive for leg swelling. Negative for chest pain and palpitations.  Gastrointestinal: Negative for abdominal pain, blood in stool, constipation, diarrhea, heartburn, nausea and vomiting.  Genitourinary: Negative for dysuria, frequency, hematuria and urgency.  Musculoskeletal: Negative for back pain, falls and myalgias.  Skin: Negative for rash.  Neurological: Negative for dizziness, sensory change, loss of consciousness, weakness and headaches.  Endo/Heme/Allergies: Negative for environmental allergies. Does not  bruise/bleed easily.  Psychiatric/Behavioral: Negative for depression and suicidal ideas. The patient is not nervous/anxious and does not have insomnia.        Objective:    Physical Exam  Constitutional: She is oriented to person, place, and time. She appears well-developed and well-nourished.  HENT:  Right Ear: External ear normal.  Left Ear: External ear normal.  + PND + errythema  Eyes: Conjunctivae are normal. Right eye exhibits no discharge. Left eye exhibits no discharge.  Cardiovascular: Normal rate, regular rhythm and  normal heart sounds.  No murmur heard. Pulmonary/Chest: Effort normal. No respiratory distress. She has wheezes. She has no rales. She exhibits no tenderness.  + exp wheeze--- neb with duoneb given with clearing  Musculoskeletal: She exhibits edema.       Right ankle: She exhibits swelling.       Left ankle: She exhibits swelling.       Feet:  Lymphadenopathy:    She has cervical adenopathy.  Neurological: She is alert and oriented to person, place, and time.  Nursing note and vitals reviewed.   BP 137/76 (BP Location: Right Arm, Cuff Size: Large)   Pulse 94   Temp 98.2 F (36.8 C) (Oral)   Resp 16   Ht 5\' 3"  (1.6 m)   Wt (!) 311 lb 12.8 oz (141.4 kg)   SpO2 98%   BMI 55.23 kg/m  Wt Readings from Last 3 Encounters:  10/19/17 (!) 311 lb 12.8 oz (141.4 kg)  10/16/17 (!) 314 lb (142.4 kg)  07/31/17 (!) 303 lb (137.4 kg)   BP Readings from Last 3 Encounters:  10/19/17 137/76  10/16/17 113/60  07/31/17 120/80     Immunization History  Administered Date(s) Administered  . Influenza,inj,Quad PF,6+ Mos 08/21/2014, 10/27/2015, 08/23/2016, 05/11/2017  . Influenza-Unspecified 06/12/2013  . Pneumococcal Conjugate-13 04/06/2017  . Pneumococcal Polysaccharide-23 09/12/2012  . Tdap 09/12/2009    Health Maintenance  Topic Date Due  . COLONOSCOPY  07/21/2002  . MAMMOGRAM  01/01/2015  . DEXA SCAN  07/21/2017  . PNA vac Low Risk Adult (2 of 2 -  PPSV23) 04/06/2018  . TETANUS/TDAP  09/13/2019  . INFLUENZA VACCINE  Completed  . Hepatitis C Screening  Completed  . HIV Screening  Completed    Lab Results  Component Value Date   WBC 5.3 05/11/2017   HGB 12.9 05/11/2017   HCT 39.3 05/11/2017   PLT 167.0 05/11/2017   GLUCOSE 136 (H) 05/11/2017   CHOL 126 02/27/2017   TRIG 77.0 02/27/2017   HDL 30.30 (L) 02/27/2017   LDLCALC 81 02/27/2017   ALT 13 (L) 05/02/2017   AST 25 05/02/2017   NA 136 05/11/2017   K 3.7 05/11/2017   CL 100 05/11/2017   CREATININE 1.15 05/11/2017   BUN 12 05/11/2017   CO2 32 05/11/2017   TSH 4.31 02/27/2017   INR 1.10 05/01/2017   HGBA1C 5.8 07/19/2016    Lab Results  Component Value Date   TSH 4.31 02/27/2017   Lab Results  Component Value Date   WBC 5.3 05/11/2017   HGB 12.9 05/11/2017   HCT 39.3 05/11/2017   MCV 95.9 05/11/2017   PLT 167.0 05/11/2017   Lab Results  Component Value Date   NA 136 05/11/2017   K 3.7 05/11/2017   CO2 32 05/11/2017   GLUCOSE 136 (H) 05/11/2017   BUN 12 05/11/2017   CREATININE 1.15 05/11/2017   BILITOT 0.7 05/02/2017   ALKPHOS 99 05/02/2017   AST 25 05/02/2017   ALT 13 (L) 05/02/2017   PROT 8.0 05/02/2017   ALBUMIN 3.0 (L) 05/02/2017   CALCIUM 9.6 05/11/2017   ANIONGAP 5 05/02/2017   GFR 60.94 05/11/2017   Lab Results  Component Value Date   CHOL 126 02/27/2017   Lab Results  Component Value Date   HDL 30.30 (L) 02/27/2017   Lab Results  Component Value Date   LDLCALC 81 02/27/2017   Lab Results  Component Value Date   TRIG 77.0 02/27/2017   Lab Results  Component Value  Date   CHOLHDL 4 02/27/2017   Lab Results  Component Value Date   HGBA1C 5.8 07/19/2016         Assessment & Plan:   Problem List Items Addressed This Visit      Unprioritized   Acute bronchitis with COPD ([place])   Relevant Medications   albuterol (PROVENTIL) (2.5 MG/3ML) 0.083% nebulizer solution 2.5 mg (Completed)   levofloxacin (LEVAQUIN) 500 MG tablet    predniSONE (DELTASONE) 10 MG tablet   methylPREDNISolone acetate (DEPO-MEDROL) injection 80 mg (Completed)   Other Relevant Orders   CBC with Differential/Platelet   Comprehensive metabolic panel   Lower extremity edema - Primary    Inc lasix to 80 mg  Elevate legs Check labs rto 2-3 weeks      Relevant Medications   furosemide (LASIX) 40 MG tablet   Other Relevant Orders   DG Chest 2 View (Completed)   CBC with Differential/Platelet   Comprehensive metabolic panel   Pulmonary emphysema ([facility])   Relevant Medications   albuterol (PROVENTIL) (2.5 MG/3ML) 0.083% nebulizer solution 2.5 mg (Completed)   predniSONE (DELTASONE) 10 MG tablet   methylPREDNISolone acetate (DEPO-MEDROL) injection 80 mg (Completed)      I am having Sue [name] start on levofloxacin, predniSONE, and furosemide. I am also having her maintain her OXYGEN, QUEtiapine, LORazepam, albuterol, albuterol, aspirin, fluticasone, Tiotropium Bromide Monohydrate, zolpidem, metoprolol succinate, furosemide, ranitidine, losartan, levothyroxine, KLOR-CON M20, and azithromycin. We administered albuterol and methylPREDNISolone acetate.  Meds ordered this encounter  Medications  . albuterol (PROVENTIL) (2.5 MG/3ML) 0.083% nebulizer solution 2.5 mg  . levofloxacin (LEVAQUIN) 500 MG tablet    Sig: Take 1 tablet (500 mg total) by mouth daily.    Dispense:  10 tablet    Refill:  0  . predniSONE (DELTASONE) 10 MG tablet    Sig: TAKE 3 TABLETS PO QD FOR 3 DAYS THEN TAKE 2 TABLETS PO QD FOR 3 DAYS THEN TAKE 1 TABLET PO QD FOR 3 DAYS THEN TAKE 1/2 TAB PO QD FOR 3 DAYS    Dispense:  20 tablet    Refill:  0  . furosemide (LASIX) 40 MG tablet    Sig: Take 2 tablets (80 mg total) by mouth daily.    Dispense:  180 tablet    Refill:  4  . methylPREDNISolone acetate (DEPO-MEDROL) injection 80 mg    CMA served as scribe during this visit. History, Physical and Plan performed by medical provider. Documentation and orders reviewed  and attested to.  Ann Held, DO

## 2017-10-19 NOTE — Patient Instructions (Signed)

## 2017-10-20 ENCOUNTER — Other Ambulatory Visit: Payer: Medicare HMO

## 2017-10-20 ENCOUNTER — Telehealth: Payer: Self-pay

## 2017-10-20 ENCOUNTER — Other Ambulatory Visit: Payer: Self-pay | Admitting: Family Medicine

## 2017-10-20 DIAGNOSIS — J440 Chronic obstructive pulmonary disease with acute lower respiratory infection: Secondary | ICD-10-CM | POA: Diagnosis not present

## 2017-10-20 DIAGNOSIS — J209 Acute bronchitis, unspecified: Secondary | ICD-10-CM

## 2017-10-20 DIAGNOSIS — J40 Bronchitis, not specified as acute or chronic: Secondary | ICD-10-CM

## 2017-10-20 DIAGNOSIS — R600 Localized edema: Secondary | ICD-10-CM

## 2017-10-20 LAB — CBC WITH DIFFERENTIAL/PLATELET
BASOS PCT: 0.5 %
Basophils Absolute: 40 cells/uL (ref 0–200)
EOS PCT: 5.1 %
Eosinophils Absolute: 408 cells/uL (ref 15–500)
HEMATOCRIT: 34.2 % — AB (ref 35.0–45.0)
HEMOGLOBIN: 11.5 g/dL — AB (ref 11.7–15.5)
LYMPHS ABS: 3600 {cells}/uL (ref 850–3900)
MCH: 31.8 pg (ref 27.0–33.0)
MCHC: 33.6 g/dL (ref 32.0–36.0)
MCV: 94.5 fL (ref 80.0–100.0)
MPV: 12.5 fL (ref 7.5–12.5)
Monocytes Relative: 7.1 %
NEUTROS ABS: 3384 {cells}/uL (ref 1500–7800)
Neutrophils Relative %: 42.3 %
Platelets: 179 10*3/uL (ref 140–400)
RBC: 3.62 10*6/uL — AB (ref 3.80–5.10)
RDW: 13 % (ref 11.0–15.0)
Total Lymphocyte: 45 %
WBC: 8 10*3/uL (ref 3.8–10.8)
WBCMIX: 568 {cells}/uL (ref 200–950)

## 2017-10-20 LAB — COMPREHENSIVE METABOLIC PANEL
ALBUMIN: 3.4 g/dL — AB (ref 3.5–5.2)
ALK PHOS: 85 U/L (ref 39–117)
ALT: 10 U/L (ref 0–35)
AST: 19 U/L (ref 0–37)
BUN: 13 mg/dL (ref 6–23)
CHLORIDE: 100 meq/L (ref 96–112)
CO2: 30 mEq/L (ref 19–32)
Calcium: 8.4 mg/dL (ref 8.4–10.5)
Creatinine, Ser: 1.35 mg/dL — ABNORMAL HIGH (ref 0.40–1.20)
GFR: 50.58 mL/min — AB (ref >60.00)
Glucose, Bld: 147 mg/dL — ABNORMAL HIGH (ref 70–99)
POTASSIUM: 3.7 meq/L (ref 3.5–5.1)
Sodium: 136 mEq/L (ref 135–145)
TOTAL PROTEIN: 8.2 g/dL (ref 6.0–8.3)
Total Bilirubin: 0.5 mg/dL (ref 0.2–1.2)

## 2017-10-20 MED ORDER — PROMETHAZINE-DM 6.25-15 MG/5ML PO SYRP: 5.0000 mL | ORAL_SOLUTION | Freq: Four times a day (QID) | ORAL | 0 refills | Status: DC | PRN

## 2017-10-20 NOTE — Telephone Encounter (Signed)
Phenergan dm sent to pharmacy but if she feels worse she should go to er

## 2017-10-20 NOTE — Telephone Encounter (Signed)
-----   Message from Ann Held, [place] sent at 10/19/2017  5:07 PM EST ----- bronchitis

## 2017-10-20 NOTE — Telephone Encounter (Signed)
Pt aware/thx dmf 

## 2017-10-20 NOTE — Telephone Encounter (Signed)
YL-I spoke with pt/she sounds horrible/she says she is doing some better but feels that she needs something for her cough and states "I have tried everything over the counter and I can't get rid of it."/Plz advise/thx dmf

## 2017-10-25 DIAGNOSIS — J439 Emphysema, unspecified: Secondary | ICD-10-CM | POA: Diagnosis not present

## 2017-10-25 DIAGNOSIS — J9601 Acute respiratory failure with hypoxia: Secondary | ICD-10-CM | POA: Diagnosis not present

## 2017-10-25 DIAGNOSIS — J449 Chronic obstructive pulmonary disease, unspecified: Secondary | ICD-10-CM | POA: Diagnosis not present

## 2017-10-27 ENCOUNTER — Other Ambulatory Visit: Payer: Self-pay | Admitting: *Deleted

## 2017-10-27 DIAGNOSIS — R739 Hyperglycemia, unspecified: Secondary | ICD-10-CM

## 2017-11-10 ENCOUNTER — Telehealth: Payer: Self-pay | Admitting: *Deleted

## 2017-11-10 NOTE — Telephone Encounter (Signed)
Received Physician Orders from [facility]; forwarded to provider/SLS 03/01

## 2017-11-13 ENCOUNTER — Telehealth: Payer: Self-pay | Admitting: Family Medicine

## 2017-11-13 DIAGNOSIS — I10 Essential (primary) hypertension: Secondary | ICD-10-CM

## 2017-11-13 MED ORDER — IRBESARTAN 300 MG PO TABS: 300.0000 mg | ORAL_TABLET | Freq: Every day | ORAL | 1 refills | Status: DC

## 2017-11-13 NOTE — Telephone Encounter (Signed)
Could switch her to Irbesartan 300 mg tabs, 1 tab po daily, disp #30 with 1 rf then needs a BP check with RN and a cmp. For HTN in about a month

## 2017-11-13 NOTE — Telephone Encounter (Signed)
Would you be able to change in Sue [name] absence or wait until she get back on tomorrow?

## 2017-11-13 NOTE — Telephone Encounter (Signed)
Copied from [place]. Topic: Quick Communication - See Telephone Encounter >> Nov 13, 2017  9:16 AM Percell Belt A wrote: CRM for notification. See Telephone encounter for:  pt called in and said that she would like to know what Dr Etter Sjogren would suggest other then the losartan (COZAAR) 100 MG tablet [[id number]] .  She no longer wants to take that med due to the recall   [place] number [phone]    11/13/17.

## 2017-11-13 NOTE — Telephone Encounter (Signed)
Advised patient of change.  She will call back to set up nurse visit and lab visit.  rx sent in

## 2017-11-14 MED ORDER — AMLODIPINE BESYLATE 5 MG PO TABS: 5.0000 mg | ORAL_TABLET | Freq: Every day | ORAL | 2 refills | Status: DC

## 2017-11-14 NOTE — Addendum Note (Signed)
Addended by: Kem Boroughs D on: 11/14/2017 06:11 PM   Modules accepted: Orders

## 2017-11-14 NOTE — Telephone Encounter (Signed)
rx sent in and patient notified.  She will get nurse that come to her home to check bp and call us

## 2017-11-14 NOTE — Telephone Encounter (Signed)
D/c and start norvasc 5 mg #30  1 po qd, 2 refills  Ov 2-3 weeks bp check

## 2017-11-14 NOTE — Telephone Encounter (Signed)
Can we change to something else.  irbesarten on back order Losartan recall

## 2017-11-14 NOTE — Telephone Encounter (Signed)
Patient tried to pick up Irbesartan put pharmacy told pt it would be back ordered until July. Please advise.  [phone]

## 2017-11-22 DIAGNOSIS — J9601 Acute respiratory failure with hypoxia: Secondary | ICD-10-CM | POA: Diagnosis not present

## 2017-11-22 DIAGNOSIS — J449 Chronic obstructive pulmonary disease, unspecified: Secondary | ICD-10-CM | POA: Diagnosis not present

## 2017-11-22 DIAGNOSIS — J439 Emphysema, unspecified: Secondary | ICD-10-CM | POA: Diagnosis not present

## 2017-11-23 ENCOUNTER — Telehealth: Payer: Self-pay

## 2017-11-23 NOTE — Telephone Encounter (Signed)
Copied from [place] [id number]. Topic: Inquiry >> Nov 23, 2017 12:32 PM Pricilla Handler wrote: Reason for CRM: Kristin Bruins Nurse with [facility] [phone]) called stating that she checked the patient's blood pressure today, and it is 120/82. She just wanted to notify the doctor.       Thank You!!!

## 2017-11-24 NOTE — Telephone Encounter (Signed)
noted 

## 2017-12-05 ENCOUNTER — Telehealth: Payer: Self-pay | Admitting: *Deleted

## 2017-12-05 NOTE — Telephone Encounter (Signed)
Received Physician Orders from [facility], program of [facility]; forwarded to provider/SLS 03/26

## 2017-12-15 DIAGNOSIS — K148 Other diseases of tongue: Secondary | ICD-10-CM | POA: Diagnosis not present

## 2017-12-15 DIAGNOSIS — R040 Epistaxis: Secondary | ICD-10-CM | POA: Diagnosis not present

## 2017-12-23 DIAGNOSIS — J449 Chronic obstructive pulmonary disease, unspecified: Secondary | ICD-10-CM | POA: Diagnosis not present

## 2017-12-23 DIAGNOSIS — J9601 Acute respiratory failure with hypoxia: Secondary | ICD-10-CM | POA: Diagnosis not present

## 2017-12-23 DIAGNOSIS — J439 Emphysema, unspecified: Secondary | ICD-10-CM | POA: Diagnosis not present

## 2017-12-25 DIAGNOSIS — Z7289 Other problems related to lifestyle: Secondary | ICD-10-CM | POA: Diagnosis not present

## 2017-12-25 DIAGNOSIS — K1329 Other disturbances of oral epithelium, including tongue: Secondary | ICD-10-CM | POA: Diagnosis not present

## 2017-12-25 DIAGNOSIS — Z87891 Personal history of nicotine dependence: Secondary | ICD-10-CM | POA: Diagnosis not present

## 2017-12-25 DIAGNOSIS — K148 Other diseases of tongue: Secondary | ICD-10-CM | POA: Diagnosis not present

## 2017-12-26 DIAGNOSIS — K148 Other diseases of tongue: Secondary | ICD-10-CM | POA: Diagnosis not present

## 2017-12-27 ENCOUNTER — Telehealth: Payer: Self-pay | Admitting: *Deleted

## 2017-12-27 NOTE — Telephone Encounter (Signed)
Received Physician Orders/Plan of Care from [facility]; forwarded to provider/SLS 04/17

## 2018-01-11 ENCOUNTER — Other Ambulatory Visit: Payer: Self-pay | Admitting: Family Medicine

## 2018-01-11 ENCOUNTER — Other Ambulatory Visit: Payer: Self-pay | Admitting: Adult Health

## 2018-01-11 DIAGNOSIS — E876 Hypokalemia: Secondary | ICD-10-CM

## 2018-01-16 NOTE — Progress Notes (Signed)
CARDIOLOGY OFFICE NOTE  Date:  01/17/2018    Sue [name] Date of Birth: [birth year] Medical Record #[id number]  PCP:  Ann Held, DO  Cardiologist:  Marisa Cyphers    Chief Complaint  Patient presents with  . Shortness of Breath  . Edema    Work in visit - seen for Dr. Marlou Porch    History of Present Illness: Sue [name] is a 67 y.o. female who presents today for a follow up visit. Seen for Dr. Marlou Porch.   She has a history of GERD, emphysema, PVCs, depression, HTN, hypothyroidism, prior stroke, & hypertension.   Referred here back in July of 2018 for evaluation of heart failure at the request of Dr.Lowne. Echo was obtained - this showed normal EF and grade 1 DD. Weight loss recommended.   Hospitalized back in August of 2018 with neck pain/numbness with radiation to the left arm and associated chest heaviness. Noted that her symptoms resolved with diuresis. Troponins were negative. Was asked to follow up here.   I saw her back in November of 2018 - she has oxygen. She does not drive. Transportation is difficult. Pretty sedentary as a general rule. Lots of excessive salt use. Her weight was felt to be the crux of her issues.    Comes in today. Here with her granddaughter Lovena Le today. She notes it has taken her a month to get an appointment here. She is here for swelling. Her ARB was stopped (Losartan) - she was given Norvasc - then had swelling in her lower extremities - requiring Lasix. She has Avapro on her med list - she says she is not taking this. She is now on 80 mg of Lasix - and it is not working. She remains on chronic oxygen. Her medicines were changed in March - this is when the swelling started. She thinks she may be more short of breath. Admits she is getting too much salt - eats out most meals. Feels bloated. She just got back on her potassium - had ran out a few days ago.   Past Medical History:  Diagnosis Date  . Allergic rhinitis   .  Depression   . Emphysema of lung ([place])   . GERD (gastroesophageal reflux disease)   . Hypertension   . Hypothyroidism   . Stroke [facility]) 2016   TIA   . Urine incontinence     Past Surgical History:  Procedure Laterality Date  . ABDOMINAL HYSTERECTOMY    . CESAREAN SECTION       Medications: Current Meds  Medication Sig  . albuterol (PROVENTIL HFA;VENTOLIN HFA) 108 (90 Base) MCG/ACT inhaler Inhale 1-2 puffs into the lungs every 6 (six) hours as needed for wheezing or shortness of breath.  Marland Kitchen albuterol (PROVENTIL) (2.5 MG/3ML) 0.083% nebulizer solution Take 3 mLs (2.5 mg total) by nebulization every 6 (six) hours as needed for wheezing or shortness of breath.  Marland Kitchen amLODipine (NORVASC) 5 MG tablet Take 1 tablet (5 mg total) by mouth daily.  Marland Kitchen aspirin EC 81 MG EC tablet Take 1 tablet (81 mg total) by mouth daily.  . busPIRone (BUSPAR) 15 MG tablet Take 22 mg by mouth daily.   . fluticasone (FLONASE) 50 MCG/ACT nasal spray Place 2 sprays into both nostrils daily as needed for allergies.  . furosemide (LASIX) 20 MG tablet Take 80 mg by mouth daily.  . irbesartan (AVAPRO) 300 MG tablet Take 1 tablet (300 mg total) by mouth daily.  Marland Kitchen  levothyroxine (SYNTHROID, LEVOTHROID) 50 MCG tablet TAKE ONE TABLET BY MOUTH ONCE DAILY  . LORazepam (ATIVAN) 2 MG tablet Take 1 tablet (2 mg total) by mouth every 6 (six) hours as needed for anxiety.  . metoprolol succinate (TOPROL-XL) 50 MG 24 hr tablet TAKE 2 TABLETS BY MOUTH IN THE MORNING WITH OR IMMEDIATELY FOLLOWING MEAL  . OXYGEN Inhale 3 L into the lungs continuous.   . potassium chloride SA (K-DUR,KLOR-CON) 20 MEQ tablet TAKE 1 TABLET BY MOUTH ONCE DAILY  . QUEtiapine (SEROQUEL) 50 MG tablet Take 100 mg by mouth at bedtime.   . ranitidine (ZANTAC) 150 MG tablet Take 1 tablet (150 mg total) by mouth at bedtime.  . Tiotropium Bromide Monohydrate (SPIRIVA RESPIMAT) 2.5 MCG/ACT AERS Inhale 2 puffs into the lungs daily.  Marland Kitchen zolpidem (AMBIEN) 5 MG tablet Take 2  tablets (10 mg total) by mouth at bedtime.     Allergies: No Known Allergies  Social History: The patient  reports that she quit smoking about 6 years ago. Her smoking use included cigarettes. She started smoking about 45 years ago. She has a 40.00 pack-year smoking history. She has never used smokeless tobacco. She reports that she drinks alcohol. She reports that she does not use drugs.   Family History: The patient's family history includes Asthma in her son and son; Bipolar disorder in her father; Depression in her father; Diabetes in her sister; Heart disease in her brother, father, paternal aunt, paternal grandmother, and paternal uncle; Heart disease (age of onset: 67) in her sister; Hyperlipidemia in her sister; Hypertension in her brother, father, and sister; Schizophrenia in her paternal aunt.   Review of Systems: Please see the history of present illness.   Otherwise, the review of systems is positive for none.   All other systems are reviewed and negative.   Physical Exam: VS:  BP 140/90 (BP Location: Left Arm, Patient Position: Sitting, Cuff Size: Large)   Pulse 87   Ht 5\' 3"  (1.6 m)   Wt (!) 308 lb 12.8 oz (140.1 kg)   SpO2 90% Comment: walking in 3L/at rest 94 on 3L  BMI 54.70 kg/m  .  BMI Body mass index is 54.7 kg/m.  Wt Readings from Last 3 Encounters:  01/17/18 (!) 308 lb 12.8 oz (140.1 kg)  10/19/17 (!) 311 lb 12.8 oz (141.4 kg)  10/16/17 (!) 314 lb (142.4 kg)    General: Morbidly obese. She is alert and in no acute distress. She has oxygen in place.  She weighed 303 at my last visit in November.  HEENT: Normal.  Neck: Supple, no JVD, carotid bruits, or masses noted.  Cardiac: Regular rate and rhythm. No murmurs, rubs, or gallops. Heart tones are distant. Legs are very full - with over 2+ edema.  Respiratory:  Lungs are clear to auscultation bilaterally with normal work of breathing.  GI: Soft and nontender.  MS: No deformity or atrophy. Gait and ROM intact.    Skin: Warm and dry. Color is normal.  Neuro:  Strength and sensation are intact and no gross focal deficits noted.  Psych: Alert, appropriate and with normal affect.   LABORATORY DATA:  EKG:  EKG is not ordered today.   Lab Results  Component Value Date   WBC 8.0 10/20/2017   HGB 11.5 (L) 10/20/2017   HCT 34.2 (L) 10/20/2017   PLT 179 10/20/2017   GLUCOSE 147 (H) 10/19/2017   CHOL 126 02/27/2017   TRIG 77.0 02/27/2017   HDL 30.30 (L)  02/27/2017   LDLCALC 81 02/27/2017   ALT 10 10/19/2017   AST 19 10/19/2017   NA 136 10/19/2017   K 3.7 10/19/2017   CL 100 10/19/2017   CREATININE 1.35 (H) 10/19/2017   BUN 13 10/19/2017   CO2 30 10/19/2017   TSH 4.31 02/27/2017   INR 1.10 05/01/2017   HGBA1C 5.8 07/19/2016     BNP (last 3 results) Recent Labs    03/25/17 2335 05/01/17 1532  BNP 15.3 10.4    ProBNP (last 3 results) No results for input(s): PROBNP in the last 8760 hours.   Other Studies Reviewed Today:  Echo Study Conclusions 03/2017  - Left ventricle: Proximal septal thickening The cavity size was normal. Wall thickness was increased in a pattern of mild LVH. Systolic function was vigorous. The estimated ejection fraction was in the range of 65% to 70%. Doppler parameters are consistent with abnormal left ventricular relaxation (grade 1 diastolic dysfunction).   Assessment/Plan:  1. Acute on chronic diastolic HF - salt is playing a role. I suspect the Norvasc is exacerbating as well. Will stop Norvasc. Stop Lasix and use Torsemide - 40 mg BID for a week - then 20 mg BID until seen back. Really encouraged her to try and restrict her salt better. Overall situation tenuous at best. If she fails to improve, I will update her echo.   2. Shortness of breath - multifactorial - from diastolic HF, obesity playing a role as well and COPD.    3. Morbid obesity - This remains the crux of her issues  3. COPD - on oxygen  4. HTN - BP just fair. She  says she is not on Avapro - will restart this - stopping Norvasc.   5. Prior palpitations - not endorsed today.    Current medicines are reviewed with the patient today.  The patient does not have concerns regarding medicines other than what has been noted above.  The following changes have been made:  See above.  Labs/ tests ordered today include:   No orders of the defined types were placed in this encounter.    Disposition:   FU with me in about 2 weeks.    Patient is agreeable to this plan and will call if any problems develop in the interim.   SignedTruitt Merle, NP  01/17/2018 3:58 PM  [facility] [address] [place], [place]  [address] Phone: [phone] Fax: [phone]

## 2018-01-17 ENCOUNTER — Ambulatory Visit (INDEPENDENT_AMBULATORY_CARE_PROVIDER_SITE_OTHER): Payer: Medicare HMO | Admitting: Nurse Practitioner

## 2018-01-17 ENCOUNTER — Encounter: Payer: Self-pay | Admitting: Nurse Practitioner

## 2018-01-17 VITALS — BP 140/90 | HR 87 | Ht 63.0 in | Wt 308.8 lb

## 2018-01-17 DIAGNOSIS — R0600 Dyspnea, unspecified: Secondary | ICD-10-CM

## 2018-01-17 DIAGNOSIS — I5031 Acute diastolic (congestive) heart failure: Secondary | ICD-10-CM | POA: Diagnosis not present

## 2018-01-17 DIAGNOSIS — R0609 Other forms of dyspnea: Secondary | ICD-10-CM | POA: Diagnosis not present

## 2018-01-17 MED ORDER — IRBESARTAN 300 MG PO TABS: 300.0000 mg | ORAL_TABLET | Freq: Every day | ORAL | 11 refills | Status: DC

## 2018-01-17 MED ORDER — TORSEMIDE 20 MG PO TABS: 40.0000 mg | ORAL_TABLET | Freq: Two times a day (BID) | ORAL | 3 refills | Status: DC

## 2018-01-17 NOTE — Patient Instructions (Addendum)
We will be checking the following labs today - BMET, CBC, BNP   Medication Instructions:    Continue with your current medicines. BUT  STOP Amlodipine  STOP Furosemide  START Torsemide 20 mg tablets - take 2 pills twice a day for one week - then one pill twice a day until seen back  START Irbesartan 300 mg a day - this is at the drug store.     Testing/Procedures To Be Arranged:  N/A  Follow-Up:   See me in about 2 weeks    Other Special Instructions:   Really try to restrict your salt as best you can.     If you need a refill on your cardiac medications before your next appointment, please call your pharmacy.   Call the [place] office at [phone] if you have any questions, problems or concerns.

## 2018-01-18 LAB — BASIC METABOLIC PANEL
BUN/Creatinine Ratio: 7 — ABNORMAL LOW (ref 12–28)
BUN: 9 mg/dL (ref 8–27)
CO2: 25 mmol/L (ref 20–29)
Calcium: 8.8 mg/dL (ref 8.7–10.3)
Chloride: 97 mmol/L (ref 96–106)
Creatinine, Ser: 1.25 mg/dL — ABNORMAL HIGH (ref 0.57–1.00)
GFR calc Af Amer: 52 mL/min/{1.73_m2} — ABNORMAL LOW (ref >59)
GFR calc non Af Amer: 45 mL/min/{1.73_m2} — ABNORMAL LOW (ref >59)
Glucose: 137 mg/dL — ABNORMAL HIGH (ref 65–99)
Potassium: 3.6 mmol/L (ref 3.5–5.2)
Sodium: 140 mmol/L (ref 134–144)

## 2018-01-18 LAB — PRO B NATRIURETIC PEPTIDE: NT-Pro BNP: 42 pg/mL (ref 0–301)

## 2018-01-18 LAB — CBC
Hematocrit: 38.5 % (ref 34.0–46.6)
Hemoglobin: 12.6 g/dL (ref 11.1–15.9)
MCH: 31.5 pg (ref 26.6–33.0)
MCHC: 32.7 g/dL (ref 31.5–35.7)
MCV: 96 fL (ref 79–97)
Platelets: 235 10*3/uL (ref 150–379)
RBC: 4 x10E6/uL (ref 3.77–5.28)
RDW: 14 % (ref 12.3–15.4)
WBC: 7.3 10*3/uL (ref 3.4–10.8)

## 2018-01-22 DIAGNOSIS — J439 Emphysema, unspecified: Secondary | ICD-10-CM | POA: Diagnosis not present

## 2018-01-22 DIAGNOSIS — J449 Chronic obstructive pulmonary disease, unspecified: Secondary | ICD-10-CM | POA: Diagnosis not present

## 2018-01-22 DIAGNOSIS — J9601 Acute respiratory failure with hypoxia: Secondary | ICD-10-CM | POA: Diagnosis not present

## 2018-01-23 ENCOUNTER — Telehealth: Payer: Self-pay | Admitting: *Deleted

## 2018-01-23 NOTE — Telephone Encounter (Signed)
Received Physician Orders from [place], [place]; forwarded to provider/SLS 05/14

## 2018-01-24 ENCOUNTER — Other Ambulatory Visit: Payer: Self-pay | Admitting: Family Medicine

## 2018-01-31 ENCOUNTER — Ambulatory Visit (INDEPENDENT_AMBULATORY_CARE_PROVIDER_SITE_OTHER): Payer: Medicare HMO | Admitting: Nurse Practitioner

## 2018-01-31 ENCOUNTER — Encounter: Payer: Self-pay | Admitting: Nurse Practitioner

## 2018-01-31 VITALS — BP 90/60 | HR 85 | Ht 63.0 in | Wt 306.1 lb

## 2018-01-31 DIAGNOSIS — I5032 Chronic diastolic (congestive) heart failure: Secondary | ICD-10-CM

## 2018-01-31 NOTE — Patient Instructions (Addendum)
We will be checking the following labs today - BMET   Medication Instructions:    Continue with your current medicines.     Testing/Procedures To Be Arranged:  N/A  Follow-Up:   See Dr. Marlou Porch or me in about 4 months    Other Special Instructions:   Call your insurance company and see if they can help you with dietary resources/education    If you need a refill on your cardiac medications before your next appointment, please call your pharmacy.   Call the [place] office at [phone] if you have any questions, problems or concerns.

## 2018-01-31 NOTE — Progress Notes (Signed)
CARDIOLOGY OFFICE NOTE  Date:  01/31/2018    Sue [name] Date of Birth: 1951/08/30 Medical Record #[id number]  PCP:  Ann Held, DO  Cardiologist:  Marisa Cyphers    Chief Complaint  Patient presents with  . Hypertension  . Edema    2 week check - seen for Dr. Marlou Porch    History of Present Illness: Sue [name] is a 67 y.o. female who presents today for a 2 week check. Seen for Dr. Marlou Porch.   She has a history of morbid obesity, GERD, emphysema,PVCs,depression, HTN, hypothyroidism, priorstroke, &hypertension.   Referred here back in July of 2018 forevaluation of heart failure at the request of Dr.Lowne.Echo was obtained - this showed normal EF and grade 1 DD. Weight loss recommended.  Hospitalized back in August of 2018 with neck pain/numbness with radiation to the left arm and associated chest heaviness. Noted that her symptoms resolved with diuresis.Troponins were negative.Was asked to follow up here.  I saw her back in November of 2018 - she was on oxygen. She does not drive. Transportation is difficult. Pretty sedentary as a general rule. Lots of excessive salt use. Her weight was felt to be the crux of her issues.    Last seen by me earlier this month - here for swelling - her ARB had been stopped and she was placed on Norvasc - then had more swelling which required lasix. Still getting too much salt. I changed her medicines back around.   Comes in today. Herealone today. Doing much better. Swelling is resolved. BP ok. Weight is down a few pounds. Breathing has improved. No chest pain. She wishes she could see a nutritionist that her insurance would pay for - I have suggested she call them and inquire. Her diet is poor - sleeps til about 4 pm and then up all night - eats only one meal a day. BP is ok.     Past Medical History:  Diagnosis Date  . Allergic rhinitis   . Depression   . Emphysema of lung ([place])   . GERD (gastroesophageal  reflux disease)   . Hypertension   . Hypothyroidism   . Stroke [facility]) 2016   TIA   . Urine incontinence     Past Surgical History:  Procedure Laterality Date  . ABDOMINAL HYSTERECTOMY    . CESAREAN SECTION       Medications: Current Meds  Medication Sig  . albuterol (PROVENTIL HFA;VENTOLIN HFA) 108 (90 Base) MCG/ACT inhaler Inhale 1-2 puffs into the lungs every 6 (six) hours as needed for wheezing or shortness of breath.  Marland Kitchen albuterol (PROVENTIL) (2.5 MG/3ML) 0.083% nebulizer solution Take 3 mLs (2.5 mg total) by nebulization every 6 (six) hours as needed for wheezing or shortness of breath.  Marland Kitchen aspirin EC 81 MG EC tablet Take 1 tablet (81 mg total) by mouth daily.  . busPIRone (BUSPAR) 15 MG tablet Take 22 mg by mouth daily.   . fluticasone (FLONASE) 50 MCG/ACT nasal spray Place 2 sprays into both nostrils daily as needed for allergies.  Marland Kitchen irbesartan (AVAPRO) 300 MG tablet Take 1 tablet (300 mg total) by mouth daily.  Marland Kitchen levothyroxine (SYNTHROID, LEVOTHROID) 50 MCG tablet TAKE ONE TABLET BY MOUTH ONCE DAILY  . LORazepam (ATIVAN) 2 MG tablet Take 1 tablet (2 mg total) by mouth every 6 (six) hours as needed for anxiety.  . metoprolol succinate (TOPROL-XL) 50 MG 24 hr tablet TAKE 2 TABLETS BY MOUTH ONCE DAILY  IN THE MORNING WITH A MEAL OR IMMEDIATELY FOLLOWING A MEAL  . OXYGEN Inhale 3 L into the lungs continuous.   . potassium chloride SA (K-DUR,KLOR-CON) 20 MEQ tablet TAKE 1 TABLET BY MOUTH ONCE DAILY  . QUEtiapine (SEROQUEL) 50 MG tablet Take 100 mg by mouth at bedtime.   . ranitidine (ZANTAC) 150 MG tablet Take 1 tablet (150 mg total) by mouth at bedtime.  . Tiotropium Bromide Monohydrate (SPIRIVA RESPIMAT) 2.5 MCG/ACT AERS Inhale 2 puffs into the lungs daily.  Marland Kitchen torsemide (DEMADEX) 20 MG tablet Take 2 tablets (40 mg total) by mouth 2 (two) times daily. 40 mg twice a day for one week - then cut to 20 mg twice a day until seen back  . zolpidem (AMBIEN) 5 MG tablet Take 2 tablets (10 mg  total) by mouth at bedtime.     Allergies: Allergies  Allergen Reactions  . Norvasc [Amlodipine Besylate]     Marked swelling    Social History: The patient  reports that she quit smoking about 6 years ago. Her smoking use included cigarettes. She started smoking about 45 years ago. She has a 40.00 pack-year smoking history. She has never used smokeless tobacco. She reports that she drinks alcohol. She reports that she does not use drugs.   Family History: The patient's family history includes Asthma in her son and son; Bipolar disorder in her father; Depression in her father; Diabetes in her sister; Heart disease in her brother, father, paternal aunt, paternal grandmother, and paternal uncle; Heart disease (age of onset: 102) in her sister; Hyperlipidemia in her sister; Hypertension in her brother, father, and sister; Schizophrenia in her paternal aunt.   Review of Systems: Please see the history of present illness.   Otherwise, the review of systems is positive for none.   All other systems are reviewed and negative.   Physical Exam: VS:  BP 90/60 (BP Location: Left Arm, Patient Position: Sitting, Cuff Size: Normal)   Pulse 85   Ht 5\' 3"  (1.6 m)   Wt (!) 306 lb 1.9 oz (138.9 kg)   SpO2 (!) 84% Comment: on 2 :L walking in 95 resting  BMI 54.23 kg/m  .  BMI Body mass index is 54.23 kg/m.  Wt Readings from Last 3 Encounters:  01/31/18 (!) 306 lb 1.9 oz (138.9 kg)  01/17/18 (!) 308 lb 12.8 oz (140.1 kg)  10/19/17 (!) 311 lb 12.8 oz (141.4 kg)    General: Pleasant. Morbidly obese. Alert and in no acute distress. She is down 5# since February.  She looks better today.  HEENT: Normal.  Neck: Supple, no JVD, carotid bruits, or masses noted.  Cardiac: Regular rate and rhythm. Heart tones are distant due to body habitus. No edema.  Respiratory:  Decreased breath sounds. She has oxygen in place - sats down to 84% with coming in - then back up to low 90's.   GI: Soft and nontender.    MS: No deformity or atrophy. Gait and ROM intact.  Skin: Warm and dry. Color is normal.  Neuro:  Strength and sensation are intact and no gross focal deficits noted.  Psych: Alert, appropriate and with normal affect.   LABORATORY DATA:  EKG:  EKG is not ordered today.  Lab Results  Component Value Date   WBC 7.3 01/17/2018   HGB 12.6 01/17/2018   HCT 38.5 01/17/2018   PLT 235 01/17/2018   GLUCOSE 137 (H) 01/17/2018   CHOL 126 02/27/2017   TRIG 77.0 02/27/2017  HDL 30.30 (L) 02/27/2017   LDLCALC 81 02/27/2017   ALT 10 10/19/2017   AST 19 10/19/2017   NA 140 01/17/2018   K 3.6 01/17/2018   CL 97 01/17/2018   CREATININE 1.25 (H) 01/17/2018   BUN 9 01/17/2018   CO2 25 01/17/2018   TSH 4.31 02/27/2017   INR 1.10 05/01/2017   HGBA1C 5.8 07/19/2016     BNP (last 3 results) Recent Labs    03/25/17 2335 05/01/17 1532  BNP 15.3 10.4    ProBNP (last 3 results) Recent Labs    01/17/18 1616  PROBNP 42     Other Studies Reviewed Today:  EchoStudy Conclusions7/2018  - Left ventricle: Proximal septal thickening The cavity size was normal. Wall thickness was increased in a pattern of mild LVH. Systolic function was vigorous. The estimated ejection fraction was in the range of 65% to 70%. Doppler parameters are consistent with abnormal left ventricular relaxation (grade 1 diastolic dysfunction).   Assessment/Plan:  1. Chronic diastolic HF - recent exacerbation from salt, Norvasc. Now looks better. Needs repeat lab - we can hold on repeating the echo for now.   2. Shortness of breath -multifactorial - from diastolic HF, obesity playing a role as well and COPD.Weight is down a few pounds. Swelling has improved. She remains on oxygen.   3. Morbid obesity- This remains the crux of her issues - she is going to call her insurance company and see if there are resources available to her.   3. COPD - on chronic oxygen  4. HTN - BP much better -  no changes today.   Current medicines are reviewed with the patient today.  The patient does not have concerns regarding medicines other than what has been noted above.  The following changes have been made:  See above.  Labs/ tests ordered today include:    Orders Placed This Encounter  Procedures  . Basic metabolic panel     Disposition:   FU with Dr. Marlou Porch as planned.    Patient is agreeable to this plan and will call if any problems develop in the interim.   SignedTruitt Merle, NP  01/31/2018 3:10 PM  [facility] [address] [place], [place]  [address] Phone: [phone] Fax: [phone]

## 2018-02-01 ENCOUNTER — Other Ambulatory Visit: Payer: Self-pay | Admitting: *Deleted

## 2018-02-01 DIAGNOSIS — R748 Abnormal levels of other serum enzymes: Secondary | ICD-10-CM

## 2018-02-01 LAB — BASIC METABOLIC PANEL
BUN/Creatinine Ratio: 10 — ABNORMAL LOW (ref 12–28)
BUN: 15 mg/dL (ref 8–27)
CO2: 25 mmol/L (ref 20–29)
Calcium: 9.7 mg/dL (ref 8.7–10.3)
Chloride: 97 mmol/L (ref 96–106)
Creatinine, Ser: 1.52 mg/dL — ABNORMAL HIGH (ref 0.57–1.00)
GFR calc Af Amer: 41 mL/min/{1.73_m2} — ABNORMAL LOW (ref >59)
GFR calc non Af Amer: 36 mL/min/{1.73_m2} — ABNORMAL LOW (ref >59)
Glucose: 178 mg/dL — ABNORMAL HIGH (ref 65–99)
Potassium: 3.7 mmol/L (ref 3.5–5.2)
Sodium: 140 mmol/L (ref 134–144)

## 2018-02-01 MED ORDER — TORSEMIDE 20 MG PO TABS: 20.0000 mg | ORAL_TABLET | Freq: Every day | ORAL | 3 refills | Status: DC

## 2018-02-08 ENCOUNTER — Other Ambulatory Visit: Payer: Self-pay | Admitting: Adult Health

## 2018-02-08 ENCOUNTER — Telehealth: Payer: Self-pay | Admitting: Internal Medicine

## 2018-02-08 NOTE — Telephone Encounter (Signed)
Called and spoke with pt stating to her until her OV with MW 7/8, we are unable to refill any meds for her since she has not been seen since 2018 with Dr. Ashok Cordia who is no longer at our office and since she last saw MW in 2015.  I also stated to pt to try to call PCP to see if they would refill her meds for her.  Pt expressed understanding. Nothing further needed at this time.

## 2018-02-12 ENCOUNTER — Other Ambulatory Visit: Payer: Medicare HMO

## 2018-02-13 ENCOUNTER — Other Ambulatory Visit: Payer: Medicare HMO

## 2018-02-14 ENCOUNTER — Other Ambulatory Visit: Payer: Medicare HMO

## 2018-02-21 ENCOUNTER — Telehealth: Payer: Self-pay

## 2018-02-21 ENCOUNTER — Other Ambulatory Visit: Payer: Self-pay | Admitting: *Deleted

## 2018-02-21 DIAGNOSIS — I10 Essential (primary) hypertension: Secondary | ICD-10-CM

## 2018-02-21 MED ORDER — LISINOPRIL 20 MG PO TABS: 20.0000 mg | ORAL_TABLET | Freq: Every day | ORAL | 9 refills | Status: DC

## 2018-02-21 NOTE — Telephone Encounter (Signed)
S/w pt is aware of Lori's recommendations. Lisinopril (20 mg ) daily sent in to pt's requested pharmacy.  Pt will keep track of bp and will discuss when pt is called for lab results in case of medication titration.  Bmet placed in system for June 19, orders in and linked, appt made.

## 2018-02-21 NOTE — Telephone Encounter (Signed)
She can try Lisinopril 20 mg a day.  Needs to monitor her BP. BMET in one week.  May need additional dosage increase.  This will take the place of Irbesartan.

## 2018-02-22 DIAGNOSIS — J449 Chronic obstructive pulmonary disease, unspecified: Secondary | ICD-10-CM | POA: Diagnosis not present

## 2018-02-22 DIAGNOSIS — J9601 Acute respiratory failure with hypoxia: Secondary | ICD-10-CM | POA: Diagnosis not present

## 2018-02-22 DIAGNOSIS — J439 Emphysema, unspecified: Secondary | ICD-10-CM | POA: Diagnosis not present

## 2018-02-28 ENCOUNTER — Other Ambulatory Visit: Payer: Medicare HMO

## 2018-03-19 ENCOUNTER — Ambulatory Visit: Payer: Medicare HMO | Admitting: Internal Medicine

## 2018-03-24 DIAGNOSIS — J9601 Acute respiratory failure with hypoxia: Secondary | ICD-10-CM | POA: Diagnosis not present

## 2018-03-24 DIAGNOSIS — J439 Emphysema, unspecified: Secondary | ICD-10-CM | POA: Diagnosis not present

## 2018-03-24 DIAGNOSIS — J449 Chronic obstructive pulmonary disease, unspecified: Secondary | ICD-10-CM | POA: Diagnosis not present

## 2018-04-05 DIAGNOSIS — F319 Bipolar disorder, unspecified: Secondary | ICD-10-CM | POA: Diagnosis not present

## 2018-04-18 ENCOUNTER — Ambulatory Visit: Payer: Medicare HMO | Admitting: Internal Medicine

## 2018-04-23 ENCOUNTER — Other Ambulatory Visit: Payer: Self-pay | Admitting: Family Medicine

## 2018-04-23 DIAGNOSIS — E876 Hypokalemia: Secondary | ICD-10-CM

## 2018-04-24 DIAGNOSIS — J449 Chronic obstructive pulmonary disease, unspecified: Secondary | ICD-10-CM | POA: Diagnosis not present

## 2018-04-24 DIAGNOSIS — J9601 Acute respiratory failure with hypoxia: Secondary | ICD-10-CM | POA: Diagnosis not present

## 2018-04-24 DIAGNOSIS — J439 Emphysema, unspecified: Secondary | ICD-10-CM | POA: Diagnosis not present

## 2018-05-08 ENCOUNTER — Encounter: Payer: Self-pay | Admitting: Cardiology

## 2018-05-09 ENCOUNTER — Ambulatory Visit (INDEPENDENT_AMBULATORY_CARE_PROVIDER_SITE_OTHER): Payer: Medicare HMO | Admitting: Internal Medicine

## 2018-05-09 ENCOUNTER — Encounter: Payer: Self-pay | Admitting: Internal Medicine

## 2018-05-09 VITALS — BP 130/84 | HR 87 | Ht 63.0 in | Wt 304.0 lb

## 2018-05-09 DIAGNOSIS — R0609 Other forms of dyspnea: Secondary | ICD-10-CM | POA: Diagnosis not present

## 2018-05-09 DIAGNOSIS — J9611 Chronic respiratory failure with hypoxia: Secondary | ICD-10-CM

## 2018-05-09 DIAGNOSIS — R0683 Snoring: Secondary | ICD-10-CM | POA: Diagnosis not present

## 2018-05-09 NOTE — Assessment & Plan Note (Signed)
-   PFT's3/20/15 wnl except DLCO 62 corrects to 75   No evidence of copd > no response to spiriva > d/c

## 2018-05-09 NOTE — Assessment & Plan Note (Signed)
Epworth score 05/09/2018 = 8 > split night study ordered / f/u sleep med prn    I had an extended discussion with the patient reviewing all relevant studies completed to date and  lasting 25 minutes of a 40  minute transition of care visit to re-establih with me p 4+ years re  non-specific but potentially very serious refractory respiratory symptoms of uncertain and potentially multiple  etiologies.   Each maintenance medication was reviewed in detail including most importantly the difference between maintenance and prns and under what circumstances the prns are to be triggered using an action plan format that is not reflected in the computer generated alphabetically organized AVS.    Please see AVS for specific instructions unique to this office visit that I personally wrote and verbalized to the the pt in detail and then reviewed with pt  by my nurse highlighting any changes in therapy/plan of care  recommended at today's visit.

## 2018-05-09 NOTE — Assessment & Plan Note (Signed)
Body mass index is 53.85 kg/m.  -  trending up  Lab Results  Component Value Date   TSH 4.31 02/27/2017     Contributing to gerd risk/ doe/? osa ? reviewed the need and the process to achieve and maintain neg calorie balance > defer f/u primary care including intermittently monitoring thyroid status

## 2018-05-09 NOTE — Progress Notes (Signed)
Subjective:    Patient ID: Sue [name], female    DOB: 1951-09-07  MRN: [id number]    Brief patient profile:  48 yobf quit smoking 2013 when  dx with copd/emphysema by [facility] doctor with doe on exertion at that point at wt 180 and steady wt gain of about 100lb assoc with increased doe x room to room and difficulty with housework then admitted with acute resp failure/ acute renal failure on acei with pfts c/w GOLD 0 criteria (no obstruction )        History of Present Illness  10/23/2013 1st [place] [facility] office visit/ [name] cc chronic orthopnea x sev years and doe and now on 02 since d/c above still gets let off at curb on dulera bid and duoneb qid s much variability, assoc with min daytime dry cough better off acei, still some HB symptoms rec dulera 200 Take 2 puffs first thing in am and then another 2 puffs about 12 hours later.  Only use the duoneb if you need it to catch your breath up to 4 x daily  In the meantime wear the 02 24/7 and especially with exercise a minimum of 30 min per day   11/12/2013 f/u ov/Sue [name] re: ? Copd/ not able to take [place]  Patient presents with  . Follow-up    Breathing unchanged since last OV-- (R) lower side pain (abdominal), now going into left side   no change sob off on dulera vs off New back pain first rad to R flank now left > CTa neg 11/06/13 and rx as uti with cipro, no better rec Ibuprofen up to 800 mg with meals as needed for pain If not better > tramadol 50 up to every 4 hours as needed if ibuprofen not helping Use incentive spirometer as much as possible   11/29/2013 f/u ov/Sue [name] re: chronic resp failure / GOLD 0  Chief Complaint  Patient presents with  . Follow-up    Breathing has improved since last OV   Not using pain meds / rare need for duoneb Baseline wt 180 but unable to lie flat x 2013 rec Keep on working on weight loss and no change on 02 recs> the goal is to keep it at 90% or better at all times but as  you lose weight it may be possible to use less 02 Pulmonary follow up is as needed      05/09/2018  f/u ov/Sue [name] re: GOLD 0 copd/ obesity ? Osa?  Chief Complaint  Patient presents with  . Follow-up    Former Dr Ashok Cordia pt.  She states her breathing is unchanged since her last visit here. She rarely uses her albuterol inhaler and neb.  Dyspnea:  No change/ MMRC3 = can't walk 100 yards even at a slow pace at a flat grade s stopping due to sob  On 3lpm  No better on spiriva so doesn't use it  Cough: none Sleeping: 3-4 pillows on 3 lpm SABA use: none / no better with saba  02 :  3lpm   Has ex bike 2 x weekly x 30 min each  Sleepy during the day / feels ok in am but by afternoon feels drowsy but never during important activities and not  While driving. epworth = 8    No obvious day to day or daytime variability or assoc excess/ purulent sputum or mucus plugs or hemoptysis or cp or chest tightness, subjective wheeze or overt sinus or hb symptoms.  Sleeping as above  without nocturnal  or early am exacerbation  of respiratory  c/o's or need for noct saba. Also denies any obvious fluctuation of symptoms with weather or environmental changes or other aggravating or alleviating factors except as outlined above   No unusual exposure hx or h/o childhood pna/ asthma or knowledge of premature birth.  Current Allergies, Complete Past Medical History, Past Surgical History, Family History, and Social History were reviewed in [employer] record.  ROS  The following are not active complaints unless bolded Hoarseness, sore throat, dysphagia, dental problems, itching, sneezing,  nasal congestion or discharge of excess mucus or purulent secretions, ear ache,   fever, chills, sweats, unintended wt loss or wt gain, classically pleuritic or exertional cp,  orthopnea pnd or arm/hand swelling  or leg swelling, presyncope, palpitations, abdominal pain, anorexia, nausea, vomiting, diarrhea   or change in bowel habits or change in bladder habits, change in stools or change in urine, dysuria, hematuria,  rash, arthralgias, visual complaints, headache, numbness, weakness or ataxia or problems with walking or coordination,  change in mood or  memory.        Current Meds  Medication Sig  . albuterol (PROVENTIL HFA;VENTOLIN HFA) 108 (90 Base) MCG/ACT inhaler Inhale 1-2 puffs into the lungs every 6 (six) hours as needed for wheezing or shortness of breath.  Marland Kitchen albuterol (PROVENTIL) (2.5 MG/3ML) 0.083% nebulizer solution Take 3 mLs (2.5 mg total) by nebulization every 6 (six) hours as needed for wheezing or shortness of breath.  Marland Kitchen aspirin EC 81 MG EC tablet Take 1 tablet (81 mg total) by mouth daily.  . busPIRone (BUSPAR) 15 MG tablet Take 22 mg by mouth daily.   . fluticasone (FLONASE) 50 MCG/ACT nasal spray Place 2 sprays into both nostrils daily as needed for allergies.  Marland Kitchen levothyroxine (SYNTHROID, LEVOTHROID) 50 MCG tablet TAKE ONE TABLET BY MOUTH ONCE DAILY  . lisinopril (PRINIVIL,ZESTRIL) 20 MG tablet Take 1 tablet (20 mg total) by mouth daily.  Marland Kitchen LORazepam (ATIVAN) 2 MG tablet Take 1 tablet (2 mg total) by mouth every 6 (six) hours as needed for anxiety.  . metoprolol succinate (TOPROL-XL) 50 MG 24 hr tablet TAKE 2 TABLETS BY MOUTH ONCE DAILY IN THE MORNING WITH A MEAL OR IMMEDIATELY FOLLOWING A MEAL  . OXYGEN Inhale 3 L into the lungs continuous.   . potassium chloride SA (K-DUR,KLOR-CON) 20 MEQ tablet Take 1 tablet (20 mEq total) by mouth daily. Needs ov  . QUEtiapine (SEROQUEL) 50 MG tablet Take 100 mg by mouth at bedtime.   . ranitidine (ZANTAC) 150 MG tablet TAKE 1 TABLET BY MOUTH ONCE DAILY AT BEDTIME  . Tiotropium Bromide Monohydrate (SPIRIVA RESPIMAT) 2.5 MCG/ACT AERS Inhale 2 puffs into the lungs daily.  Marland Kitchen zolpidem (AMBIEN) 5 MG tablet Take 2 tablets (10 mg total) by mouth at bedtime.               Objective:   Physical Exam    11/12/2013         297  > 11/29/2013   292 > 05/09/2018   304     10/23/13 294 lb (133.358 kg)  10/15/13 286 lb (129.729 kg)  10/12/13 290 lb 12.6 oz (131.9 kg)       Vital signs reviewed - Note on arrival 02 sats  95% on 3lpm      HEENT: nl dentition, turbinates bilaterally, and oropharynx. Nl external ear canals without cough reflex -Modified Mallampati Score =  3   NECK :  without JVD/Nodes/TM/ nl carotid upstrokes bilaterally   LUNGS: no acc muscle use,  Nl contour chest which is clear to A and P bilaterally without cough on insp or exp maneuvers   CV:  RRR  no s3 or murmur or increase in P2, and no edema   ABD:  Massively obese with limited  inspiratory excursion in the supine position. No bruits or organomegaly appreciated, bowel sounds nl  MS:  Nl gait/ ext warm without deformities, calf tenderness, cyanosis or clubbing No obvious joint restrictions   SKIN: warm and dry without lesions    NEURO:  alert, approp, nl sensorium with  no motor or cerebellar deficits apparent.          Assessment & Plan:

## 2018-05-09 NOTE — Patient Instructions (Addendum)
Weight control is simply a matter of calorie balance which needs to be tilted in your favor by eating less and exercising more.  To get the most out of exercise, you need to be continuously aware that you are short of breath, but never out of breath, for 30 minutes daily - increase speed you pedal first until you can do 30 min, then next add resistance.   As you improve, it will actually be easier for you to do the same amount of exercise  in  30 minutes so always push to the level where you are short of breath.  If this does not result in gradual weight reduction then I strongly recommend you see a nutritionist with a food diary x 2 weeks so that we can work out a negative calorie balance which is universally effective in steady weight loss programs.  Think of your calorie balance like you do your bank account where in this case you want the balance to go down so you must take in less calories than you burn up.  It's just that simple:  Hard to do, but easy to understand.  Good luck!     Please see patient coordinator before you leave today  to schedule split night study to see if you need cpap and we will arrange for a sleep medicine doctor to see you

## 2018-05-09 NOTE — Assessment & Plan Note (Signed)
-   placed on 02 2lpm at d/c 10/12/13 s hypercarbia on bmet  - 11/29/2013 reports sats 89% with exercise with PT    - 05/09/2018   Walked  3lpm  x one lap @ 185 stopped due to  Sob, nl  pace, sats ok at end    Adequate control on present rx, reviewed in detail with pt > no change in rx needed  = 3lpm or titrate to keep > 90% at higher levels of activity

## 2018-05-23 ENCOUNTER — Other Ambulatory Visit: Payer: Self-pay | Admitting: Family Medicine

## 2018-05-23 DIAGNOSIS — E876 Hypokalemia: Secondary | ICD-10-CM

## 2018-05-25 DIAGNOSIS — J449 Chronic obstructive pulmonary disease, unspecified: Secondary | ICD-10-CM | POA: Diagnosis not present

## 2018-05-25 DIAGNOSIS — J9601 Acute respiratory failure with hypoxia: Secondary | ICD-10-CM | POA: Diagnosis not present

## 2018-05-25 DIAGNOSIS — J439 Emphysema, unspecified: Secondary | ICD-10-CM | POA: Diagnosis not present

## 2018-05-29 ENCOUNTER — Ambulatory Visit: Payer: Medicare HMO | Admitting: Cardiology

## 2018-06-05 ENCOUNTER — Ambulatory Visit (HOSPITAL_BASED_OUTPATIENT_CLINIC_OR_DEPARTMENT_OTHER): Payer: Medicare HMO | Attending: Internal Medicine | Admitting: Pulmonary Disease

## 2018-06-05 VITALS — Ht 63.0 in | Wt 315.0 lb

## 2018-06-05 DIAGNOSIS — R0683 Snoring: Secondary | ICD-10-CM | POA: Diagnosis not present

## 2018-06-13 DIAGNOSIS — R0683 Snoring: Secondary | ICD-10-CM

## 2018-06-13 NOTE — Procedures (Signed)
    Patient Name: Sue [name], Sue [name] Date: 06/05/2018 Gender: Female D.O.B: 26-Aug-1951 Age (years): 39 Referring Provider: Tanda Rockers Height (inches): 71 Interpreting Physician: Chesley Mires MD, ABSM Weight (lbs): 315 RPSGT: Carolin Coy BMI: 56 MRN: [id number] Neck Size: 16.00 <br> <br>  CLINICAL INFORMATION  Sleep Study Type: NPSG  Indication for sleep study: COPD, Fatigue, Hypertension, Obesity, Snoring  Epworth Sleepiness Score: 5  SLEEP STUDY TECHNIQUE  As per the AASM Manual for the Scoring of Sleep and Associated Events v2.3 (April 2016) with a hypopnea requiring 4% desaturations. The channels recorded and monitored were frontal, central and occipital EEG, electrooculogram (EOG), submentalis EMG (chin), nasal and oral airflow, thoracic and abdominal wall motion, anterior tibialis EMG, snore microphone, electrocardiogram, and pulse oximetry. MEDICATIONS  Medications self-administered by patient taken the night of the study : N/A SLEEP ARCHITECTURE  The study was initiated at 10:32:38 PM and ended at 5:02:15 AM. Sleep onset time was 34.3 minutes and the sleep efficiency was 85.6%%. The total sleep time was 333.5 minutes. Stage REM latency was 52.0 minutes. The patient spent 9.3%% of the night in stage N1 sleep, 71.5%% in stage N2 sleep, 0.0%% in stage N3 and 19.2% in REM. Alpha intrusion was absent. Supine sleep was 21.09%. RESPIRATORY PARAMETERS  The overall apnea/hypopnea index (AHI) was 3.2 per hour. There were 18 total apneas, including 17 obstructive, 1 central and 0 mixed apneas. There were 0 hypopneas and 8 RERAs. The AHI during Stage REM sleep was 16.9 per hour. AHI while supine was 8.5 per hour. The mean oxygen saturation was 98.3%. The minimum SpO2 during sleep was 95.0%. moderate snoring was noted during this study. CARDIAC DATA  The 2 lead EKG demonstrated sinus rhythm. The mean heart rate was 72.9 beats per minute. Other EKG findings include:  PVCs. LEG MOVEMENT DATA  The total PLMS were 0 with a resulting PLMS index of 0.0. Associated arousal with leg movement index was 0.0 . IMPRESSIONS  - This study did not show significant obstructive or central sleep apnea.  Her overall AHI was 3.2 with an SpO2 low of 95%.  She uses 3 liters oxygen during this study. - Moderate snoring was noted. DIAGNOSIS  - Snoring. RECOMMENDATIONS  - Avoid alcohol, sedatives and other CNS depressants that may worsen sleep apnea and disrupt normal sleep architecture. - Sleep hygiene should be reviewed to assess factors that may improve sleep quality. - Weight management and regular exercise should be initiated or continued if appropriate.  [Electronically signed] 06/13/2018 06:13 AM  Chesley Mires MD, ABSM Diplomate, American Board of Sleep Medicine  NPI: [id number]

## 2018-06-14 ENCOUNTER — Ambulatory Visit: Payer: Medicare HMO | Admitting: Cardiology

## 2018-06-24 DIAGNOSIS — J9601 Acute respiratory failure with hypoxia: Secondary | ICD-10-CM | POA: Diagnosis not present

## 2018-06-24 DIAGNOSIS — J449 Chronic obstructive pulmonary disease, unspecified: Secondary | ICD-10-CM | POA: Diagnosis not present

## 2018-06-24 DIAGNOSIS — J439 Emphysema, unspecified: Secondary | ICD-10-CM | POA: Diagnosis not present

## 2018-06-25 ENCOUNTER — Other Ambulatory Visit: Payer: Self-pay | Admitting: Family Medicine

## 2018-06-25 DIAGNOSIS — E876 Hypokalemia: Secondary | ICD-10-CM

## 2018-07-04 DIAGNOSIS — H2513 Age-related nuclear cataract, bilateral: Secondary | ICD-10-CM | POA: Diagnosis not present

## 2018-07-04 DIAGNOSIS — H40033 Anatomical narrow angle, bilateral: Secondary | ICD-10-CM | POA: Diagnosis not present

## 2018-07-23 ENCOUNTER — Other Ambulatory Visit: Payer: Self-pay | Admitting: Family Medicine

## 2018-07-23 DIAGNOSIS — E876 Hypokalemia: Secondary | ICD-10-CM

## 2018-07-25 DIAGNOSIS — J439 Emphysema, unspecified: Secondary | ICD-10-CM | POA: Diagnosis not present

## 2018-07-25 DIAGNOSIS — J9601 Acute respiratory failure with hypoxia: Secondary | ICD-10-CM | POA: Diagnosis not present

## 2018-07-25 DIAGNOSIS — J449 Chronic obstructive pulmonary disease, unspecified: Secondary | ICD-10-CM | POA: Diagnosis not present

## 2018-08-15 ENCOUNTER — Other Ambulatory Visit: Payer: Self-pay

## 2018-08-15 ENCOUNTER — Emergency Department (HOSPITAL_BASED_OUTPATIENT_CLINIC_OR_DEPARTMENT_OTHER): Payer: Medicare HMO

## 2018-08-15 ENCOUNTER — Inpatient Hospital Stay (HOSPITAL_BASED_OUTPATIENT_CLINIC_OR_DEPARTMENT_OTHER)
Admission: EM | Admit: 2018-08-15 | Discharge: 2018-08-18 | DRG: 682 | Disposition: A | Discharge status: Home Health | Payer: Medicare HMO | Attending: Family Medicine | Admitting: Family Medicine

## 2018-08-15 ENCOUNTER — Encounter (HOSPITAL_BASED_OUTPATIENT_CLINIC_OR_DEPARTMENT_OTHER): Payer: Self-pay | Admitting: *Deleted

## 2018-08-15 DIAGNOSIS — E860 Dehydration: Secondary | ICD-10-CM | POA: Diagnosis not present

## 2018-08-15 DIAGNOSIS — E871 Hypo-osmolality and hyponatremia: Secondary | ICD-10-CM | POA: Diagnosis not present

## 2018-08-15 DIAGNOSIS — I5032 Chronic diastolic (congestive) heart failure: Secondary | ICD-10-CM | POA: Diagnosis not present

## 2018-08-15 DIAGNOSIS — E1122 Type 2 diabetes mellitus with diabetic chronic kidney disease: Secondary | ICD-10-CM | POA: Diagnosis present

## 2018-08-15 DIAGNOSIS — Z8349 Family history of other endocrine, nutritional and metabolic diseases: Secondary | ICD-10-CM

## 2018-08-15 DIAGNOSIS — Z87891 Personal history of nicotine dependence: Secondary | ICD-10-CM

## 2018-08-15 DIAGNOSIS — I160 Hypertensive urgency: Secondary | ICD-10-CM | POA: Diagnosis present

## 2018-08-15 DIAGNOSIS — R739 Hyperglycemia, unspecified: Secondary | ICD-10-CM | POA: Diagnosis not present

## 2018-08-15 DIAGNOSIS — Z8619 Personal history of other infectious and parasitic diseases: Secondary | ICD-10-CM

## 2018-08-15 DIAGNOSIS — R059 Cough, unspecified: Secondary | ICD-10-CM

## 2018-08-15 DIAGNOSIS — Z818 Family history of other mental and behavioral disorders: Secondary | ICD-10-CM

## 2018-08-15 DIAGNOSIS — E1165 Type 2 diabetes mellitus with hyperglycemia: Secondary | ICD-10-CM | POA: Diagnosis not present

## 2018-08-15 DIAGNOSIS — K219 Gastro-esophageal reflux disease without esophagitis: Secondary | ICD-10-CM | POA: Diagnosis present

## 2018-08-15 DIAGNOSIS — G4700 Insomnia, unspecified: Secondary | ICD-10-CM | POA: Diagnosis present

## 2018-08-15 DIAGNOSIS — Z888 Allergy status to other drugs, medicaments and biological substances status: Secondary | ICD-10-CM | POA: Diagnosis not present

## 2018-08-15 DIAGNOSIS — R0600 Dyspnea, unspecified: Secondary | ICD-10-CM | POA: Diagnosis not present

## 2018-08-15 DIAGNOSIS — Z79899 Other long term (current) drug therapy: Secondary | ICD-10-CM

## 2018-08-15 DIAGNOSIS — J9610 Chronic respiratory failure, unspecified whether with hypoxia or hypercapnia: Secondary | ICD-10-CM | POA: Diagnosis present

## 2018-08-15 DIAGNOSIS — I519 Heart disease, unspecified: Secondary | ICD-10-CM | POA: Diagnosis not present

## 2018-08-15 DIAGNOSIS — E039 Hypothyroidism, unspecified: Secondary | ICD-10-CM | POA: Diagnosis present

## 2018-08-15 DIAGNOSIS — N179 Acute kidney failure, unspecified: Principal | ICD-10-CM | POA: Diagnosis present

## 2018-08-15 DIAGNOSIS — R05 Cough: Secondary | ICD-10-CM

## 2018-08-15 DIAGNOSIS — F319 Bipolar disorder, unspecified: Secondary | ICD-10-CM | POA: Diagnosis present

## 2018-08-15 DIAGNOSIS — J9611 Chronic respiratory failure with hypoxia: Secondary | ICD-10-CM | POA: Diagnosis not present

## 2018-08-15 DIAGNOSIS — J439 Emphysema, unspecified: Secondary | ICD-10-CM | POA: Diagnosis present

## 2018-08-15 DIAGNOSIS — N183 Chronic kidney disease, stage 3 (moderate): Secondary | ICD-10-CM | POA: Diagnosis present

## 2018-08-15 DIAGNOSIS — I130 Hypertensive heart and chronic kidney disease with heart failure and stage 1 through stage 4 chronic kidney disease, or unspecified chronic kidney disease: Secondary | ICD-10-CM | POA: Diagnosis present

## 2018-08-15 DIAGNOSIS — R0602 Shortness of breath: Secondary | ICD-10-CM | POA: Diagnosis not present

## 2018-08-15 DIAGNOSIS — Z90710 Acquired absence of both cervix and uterus: Secondary | ICD-10-CM

## 2018-08-15 DIAGNOSIS — Z9981 Dependence on supplemental oxygen: Secondary | ICD-10-CM

## 2018-08-15 DIAGNOSIS — Z825 Family history of asthma and other chronic lower respiratory diseases: Secondary | ICD-10-CM

## 2018-08-15 DIAGNOSIS — J9621 Acute and chronic respiratory failure with hypoxia: Secondary | ICD-10-CM | POA: Diagnosis not present

## 2018-08-15 DIAGNOSIS — Z7982 Long term (current) use of aspirin: Secondary | ICD-10-CM

## 2018-08-15 DIAGNOSIS — Z833 Family history of diabetes mellitus: Secondary | ICD-10-CM

## 2018-08-15 DIAGNOSIS — Z8249 Family history of ischemic heart disease and other diseases of the circulatory system: Secondary | ICD-10-CM | POA: Diagnosis not present

## 2018-08-15 DIAGNOSIS — Z79890 Hormone replacement therapy (postmenopausal): Secondary | ICD-10-CM

## 2018-08-15 DIAGNOSIS — Z8673 Personal history of transient ischemic attack (TIA), and cerebral infarction without residual deficits: Secondary | ICD-10-CM

## 2018-08-15 DIAGNOSIS — Z23 Encounter for immunization: Secondary | ICD-10-CM | POA: Diagnosis not present

## 2018-08-15 DIAGNOSIS — D631 Anemia in chronic kidney disease: Secondary | ICD-10-CM | POA: Diagnosis present

## 2018-08-15 DIAGNOSIS — Z6841 Body Mass Index (BMI) 40.0 and over, adult: Secondary | ICD-10-CM

## 2018-08-15 LAB — BASIC METABOLIC PANEL
Anion gap: 9 (ref 5–15)
BUN: 24 mg/dL — ABNORMAL HIGH (ref 8–23)
CO2: 28 mmol/L (ref 22–32)
Calcium: 8.5 mg/dL — ABNORMAL LOW (ref 8.9–10.3)
Chloride: 90 mmol/L — ABNORMAL LOW (ref 98–111)
Creatinine, Ser: 2.8 mg/dL — ABNORMAL HIGH (ref 0.44–1.00)
GFR calc Af Amer: 20 mL/min — ABNORMAL LOW (ref >60)
GFR calc non Af Amer: 17 mL/min — ABNORMAL LOW (ref >60)
Glucose, Bld: 579 mg/dL (ref 70–99)
POTASSIUM: 4.1 mmol/L (ref 3.5–5.1)
Sodium: 127 mmol/L — ABNORMAL LOW (ref 135–145)

## 2018-08-15 LAB — CBC WITH DIFFERENTIAL/PLATELET
Abs Immature Granulocytes: 0.02 10*3/uL (ref 0.00–0.07)
Basophils Absolute: 0 10*3/uL (ref 0.0–0.1)
Basophils Relative: 0 %
Eosinophils Absolute: 0.3 10*3/uL (ref 0.0–0.5)
Eosinophils Relative: 4 %
HCT: 36.2 % (ref 36.0–46.0)
Hemoglobin: 11.6 g/dL — ABNORMAL LOW (ref 12.0–15.0)
Immature Granulocytes: 0 %
Lymphocytes Relative: 33 %
Lymphs Abs: 2.1 10*3/uL (ref 0.7–4.0)
MCH: 33 pg (ref 26.0–34.0)
MCHC: 32 g/dL (ref 30.0–36.0)
MCV: 102.8 fL — ABNORMAL HIGH (ref 80.0–100.0)
MONO ABS: 0.4 10*3/uL (ref 0.1–1.0)
Monocytes Relative: 6 %
NEUTROS ABS: 3.5 10*3/uL (ref 1.7–7.7)
Neutrophils Relative %: 57 %
Platelets: 154 10*3/uL (ref 150–400)
RBC: 3.52 MIL/uL — AB (ref 3.87–5.11)
RDW: 13.7 % (ref 11.5–15.5)
WBC: 6.2 10*3/uL (ref 4.0–10.5)
nRBC: 0 % (ref 0.0–0.2)

## 2018-08-15 LAB — URINALYSIS, ROUTINE W REFLEX MICROSCOPIC
Bilirubin Urine: NEGATIVE
Glucose, UA: >=500 mg/dL — AB
Hgb urine dipstick: NEGATIVE
KETONES UR: NEGATIVE mg/dL
Leukocytes, UA: NEGATIVE
Nitrite: NEGATIVE
Protein, ur: NEGATIVE mg/dL
Specific Gravity, Urine: <1.005 — ABNORMAL LOW (ref 1.005–1.030)
pH: 5.5 (ref 5.0–8.0)

## 2018-08-15 LAB — URINALYSIS, MICROSCOPIC (REFLEX)

## 2018-08-15 LAB — CBG MONITORING, ED: Glucose-Capillary: 364 mg/dL — ABNORMAL HIGH (ref 70–99)

## 2018-08-15 LAB — TROPONIN I: Troponin I: <0.03 ng/mL (ref <0.03)

## 2018-08-15 MED ORDER — SODIUM CHLORIDE 0.9 % IV BOLUS
500.0000 mL | Freq: Once | INTRAVENOUS | Status: AC
  Administered 2018-08-15: 500 mL via INTRAVENOUS

## 2018-08-15 MED ORDER — INSULIN REGULAR HUMAN 100 UNIT/ML IJ SOLN
10.0000 [IU] | Freq: Once | INTRAMUSCULAR | Status: AC
  Administered 2018-08-15: 10 [IU] via INTRAVENOUS
  Filled 2018-08-15: qty 1

## 2018-08-15 MED ORDER — ACETAMINOPHEN 500 MG PO TABS: 1000.0000 mg | ORAL_TABLET | Freq: Once | ORAL | Status: DC

## 2018-08-15 MED ORDER — INSULIN ASPART 100 UNIT/ML ~~LOC~~ SOLN: 0.0000 [IU] | SUBCUTANEOUS | Status: DC

## 2018-08-15 NOTE — ED Notes (Signed)
Pt on monitor 

## 2018-08-15 NOTE — ED Triage Notes (Signed)
Pt has had SOB X 3-4 weeks

## 2018-08-15 NOTE — ED Notes (Signed)
ED Provider at bedside. 

## 2018-08-15 NOTE — ED Provider Notes (Signed)
Emergency Department Provider Note   I have reviewed the triage vital signs and the nursing notes.   HISTORY  Chief Complaint Shortness of Breath   HPI Sue [name] is a 67 y.o. female with PMH of GERD, HTN, TIA, Emphysema on 2L [place] O2 at home, and CKD presents to the emergency department for evaluation of generalized fatigue, poor appetite, polyuria, and shortness of breath.  Patient describes shortness of breath symptoms with coughing.  Initially had symptoms for approximately 1 week which resolved but then returned within the last 7 days.  Family states that she is been very fatigued, urinating frequently, and overall not doing well.  Patient has no known history of diabetes.  She has been taking her torsemide and states that her legs are not swollen at all which is very unusual for her.  She has been urinating frequently.  No new medications.  She denies any chest pain.   Past Medical History:  Diagnosis Date  . Allergic rhinitis   . Depression   . Emphysema of lung ([place])   . GERD (gastroesophageal reflux disease)   . Hypertension   . Hypothyroidism   . Stroke [facility]) 2016   TIA   . Urine incontinence     Patient Active Problem List   Diagnosis Date Noted  . Hyperglycemia 08/16/2018  . ARF (acute renal failure) ([place]) 08/16/2018  . AKI (acute kidney injury) ([place]) 08/15/2018  . Morbid obesity due to excess calories ([place]) 05/09/2018  . Acute bronchitis with COPD ([place]) 10/19/2017  . Atypical chest pain 05/01/2017  . Pulmonary emphysema ([place]) 04/06/2017  . Chronic seasonal allergic rhinitis 04/06/2017  . GERD (gastroesophageal reflux disease) 04/06/2017  . Snoring 04/06/2017  . Myalgia 02/01/2017  . Arthralgia 02/01/2017  . Chronic pain of toe of left foot 11/14/2016  . Chronic hepatitis C without hepatic coma ([place]) 08/01/2016  . Hypothyroidism 10/29/2015  . Mild diastolic dysfunction 97/98/9211  . Edema 01/01/2015  . Lower extremity edema 12/24/2014  . TIA  (transient ischemic attack) 07/30/2014  . Weakness 07/29/2014  . Flank pain 11/12/2013  . Chronic respiratory failure ([place]) 10/25/2013  . DOE (dyspnea on exertion) 10/25/2013  . Obesity (BMI 30-39.9) 10/15/2013  . Depression with anxiety 10/15/2013  . Insomnia 10/15/2013  . HTN (hypertension) 10/09/2013  . Hypokalemia 10/09/2013    Past Surgical History:  Procedure Laterality Date  . ABDOMINAL HYSTERECTOMY    . CESAREAN SECTION     Allergies Norvasc [amlodipine besylate]  Family History  Problem Relation Age of Onset  . Heart disease Father        MVP and Pics Valve  . Hypertension Father   . Depression Father        Institutionalized x's 2 years  . Bipolar disorder Father   . Hypertension Sister   . Diabetes Sister   . Hyperlipidemia Sister   . Heart disease Sister 62       MI  . Heart disease Brother   . Hypertension Brother   . Heart disease Paternal Grandmother   . Heart disease Paternal Aunt   . Heart disease Paternal Uncle   . Schizophrenia Paternal Aunt   . Asthma Son   . Asthma Son     Social History Social History   Tobacco Use  . Smoking status: Former Smoker    Packs/day: 1.00    Years: 40.00    Pack years: 40.00    Types: Cigarettes    Start date: 07/21/1972    Last  attempt to quit: 10/16/2011    Years since quitting: 6.8  . Smokeless tobacco: Never Used  . Tobacco comment: stopped for around 9 months during 1 pregnancy.  Substance Use Topics  . Alcohol use: Yes    Comment: Occ-- Wine  . Drug use: No    Review of Systems  Constitutional: No fever/chills. Positive generalized weakness.  Eyes: No visual changes. ENT: No sore throat. Cardiovascular: Denies chest pain. Respiratory: Positive shortness of breath and cough.  Gastrointestinal: No abdominal pain.  No nausea, no vomiting.  No diarrhea.  No constipation. Genitourinary: Negative for dysuria. Positive polyuria.  Musculoskeletal: Negative for back pain. Skin: Negative for  rash. Neurological: Negative for headaches, focal weakness or numbness.  10-point ROS otherwise negative.  ____________________________________________   PHYSICAL EXAM:  VITAL SIGNS: ED Triage Vitals  Enc Vitals Group     BP 08/15/18 2025 129/84     Pulse Rate 08/15/18 2025 89     Resp 08/15/18 2025 (!) 24     Temp 08/15/18 2025 98.6 F (37 C)     Temp Source 08/15/18 2025 Oral     SpO2 08/15/18 2025 92 %     Weight 08/15/18 1829 290 lb (131.5 kg)     Height 08/15/18 1829 5\' 3"  (1.6 m)     Pain Score 08/15/18 1828 0   Constitutional: Alert and oriented. Well appearing and in no acute distress. Eyes: Conjunctivae are normal.  Head: Atraumatic. Nose: No congestion/rhinnorhea. Mouth/Throat: Mucous membranes are dry.  Neck: No stridor.  Cardiovascular: Normal rate, regular rhythm. Good peripheral circulation. Grossly normal heart sounds.   Respiratory: Normal respiratory effort.  No retractions. Lungs CTAB. Gastrointestinal: Soft and nontender. No distention.  Musculoskeletal: No lower extremity tenderness nor edema. No gross deformities of extremities. Neurologic:  Normal speech and language. No gross focal neurologic deficits are appreciated.  Skin:  Skin is warm, dry and intact. No rash noted.  ____________________________________________   LABS (all labs ordered are listed, but only abnormal results are displayed)  Labs Reviewed  BASIC METABOLIC PANEL - Abnormal; Notable for the following components:      Result Value   Sodium 127 (*)    Chloride 90 (*)    Glucose, Bld 579 (*)    BUN 24 (*)    Creatinine, Ser 2.80 (*)    Calcium 8.5 (*)    GFR calc non Af Amer 17 (*)    GFR calc Af Amer 20 (*)    All other components within normal limits  CBC WITH DIFFERENTIAL/PLATELET - Abnormal; Notable for the following components:   RBC 3.52 (*)    Hemoglobin 11.6 (*)    MCV 102.8 (*)    All other components within normal limits  URINALYSIS, ROUTINE W REFLEX MICROSCOPIC  - Abnormal; Notable for the following components:   Specific Gravity, Urine <1.005 (*)    Glucose, UA >=500 (*)    All other components within normal limits  URINALYSIS, MICROSCOPIC (REFLEX) - Abnormal; Notable for the following components:   Bacteria, UA RARE (*)    All other components within normal limits  HEMOGLOBIN A1C - Abnormal; Notable for the following components:   Hgb A1c MFr Bld 10.5 (*)    All other components within normal limits  BASIC METABOLIC PANEL - Abnormal; Notable for the following components:   Sodium 131 (*)    Chloride 93 (*)    Glucose, Bld 540 (*)    BUN 25 (*)    Creatinine, Ser 2.64 (*)  Calcium 8.3 (*)    GFR calc non Af Amer 18 (*)    GFR calc Af Amer 21 (*)    All other components within normal limits  HEPATIC FUNCTION PANEL - Abnormal; Notable for the following components:   Total Protein 8.3 (*)    Albumin 3.4 (*)    All other components within normal limits  CBC WITH DIFFERENTIAL/PLATELET - Abnormal; Notable for the following components:   RBC 3.52 (*)    Hemoglobin 11.7 (*)    MCV 103.7 (*)    All other components within normal limits  HEMOGLOBIN A1C - Abnormal; Notable for the following components:   Hgb A1c MFr Bld 10.6 (*)    All other components within normal limits  GLUCOSE, CAPILLARY - Abnormal; Notable for the following components:   Glucose-Capillary 345 (*)    All other components within normal limits  D-DIMER, QUANTITATIVE (NOT AT [facility]) - Abnormal; Notable for the following components:   D-Dimer, Quant 1.23 (*)    All other components within normal limits  GLUCOSE, CAPILLARY - Abnormal; Notable for the following components:   Glucose-Capillary 474 (*)    All other components within normal limits  CBG MONITORING, ED - Abnormal; Notable for the following components:   Glucose-Capillary 364 (*)    All other components within normal limits  CBG MONITORING, ED - Abnormal; Notable for the following components:   Glucose-Capillary  374 (*)    All other components within normal limits  TROPONIN I  SODIUM, URINE, RANDOM  MAGNESIUM  TROPONIN I  HIV ANTIBODY (ROUTINE TESTING W REFLEX)  CBC   ____________________________________________  EKG   EKG Interpretation  Date/Time:  Wednesday August 15 2018 20:59:27 EST Ventricular Rate:  82 PR Interval:    QRS Duration: 90 QT Interval:  403 QTC Calculation: 471 R Axis:   64 Text Interpretation:  Sinus rhythm Borderline T abnormalities, anterior leads ST changes inferiorly similar to prior.  No STEMI.  Confirmed by Nanda Quinton [phone]) on 08/15/2018 9:23:17 PM Also confirmed by Nanda Quinton [phone]), editor Hattie Perch [phone])  on 08/16/2018 8:11:57 AM       ____________________________________________  RADIOLOGY  Dg Chest 2 View  Result Date: 08/15/2018 CLINICAL DATA:  Dyspnea times 3-4 weeks. EXAM: CHEST - 2 VIEW COMPARISON:  10/19/2017 FINDINGS: The heart size and mediastinal contours are within normal limits. Chronic diffuse mild bilateral interstitial pulmonary changes are stable. No alveolar consolidation, effusion or pneumothorax. The visualized skeletal structures are unremarkable. IMPRESSION: Stable interstitial prominence of the lungs, nonspecific but may reflect chronic interstitial lung disease. Electronically Signed   By: Ashley Royalty M.D.   On: 08/15/2018 20:02    ____________________________________________   PROCEDURES  Procedure(s) performed:   Procedures  None ____________________________________________   INITIAL IMPRESSION / ASSESSMENT AND PLAN / ED COURSE  Pertinent labs & imaging results that were available during my care of the patient were reviewed by me and considered in my medical decision making (see chart for details).  Patient presents to the emergency department with generalized weakness, polyuria, shortness of breath, cough.  Patient has baseline emphysema symptoms.  No wheezing on my exam.  She is on her home oxygen  and doing well.  No respiratory distress.  Chest x-ray from triage shows no acute findings.  Given her generalized fatigue and other nonspecific symptoms I did obtain lab work including metabolic panel and troponin.  Negative troponin.  No significant anemia.  Patient does have acute kidney injury with creatinine of 2.8  which is up from her baseline around 1.5.  She also has significant hyperglycemia with blood sugar greater than 500.  No diabetic ketoacidosis.  Patient with a pseudohyponatremia as well with sodium of 128 (Corrected 136). Patient AKI appears to be pre-renal likely from home diuresis complicated by polyuria related to DM. Plan for insulin here, small fluid bolus, and admit for further monitoring and repeat labs.   Discussed patient's case with Hospitalist, Dr. Roel Cluck to request admission. Patient and family (if present) updated with plan. Care transferred to Hospitalist service.  I reviewed all nursing notes, vitals, pertinent old records, EKGs, labs, imaging (as available).  ____________________________________________  FINAL CLINICAL IMPRESSION(S) / ED DIAGNOSES  Final diagnoses:  AKI (acute kidney injury) ([place])  Hyperglycemia  SOB (shortness of breath)     Note:  This document was prepared using Dragon voice recognition software and may include unintentional dictation errors.  Nanda Quinton, MD Emergency Medicine    Long, [facility], MD 08/16/18 1100

## 2018-08-15 NOTE — ED Notes (Signed)
Date and time results received: 08/15/18 2129 (use smartphrase ".now" to insert current time)  Test: glucose Critical Value: 579  Name of Provider Notified: Dr. Laverta Baltimore  Orders Received? Or Actions Taken?: awaiting further orders

## 2018-08-16 ENCOUNTER — Encounter (HOSPITAL_COMMUNITY): Payer: Self-pay | Admitting: Internal Medicine

## 2018-08-16 DIAGNOSIS — N179 Acute kidney failure, unspecified: Principal | ICD-10-CM

## 2018-08-16 DIAGNOSIS — R739 Hyperglycemia, unspecified: Secondary | ICD-10-CM

## 2018-08-16 LAB — GLUCOSE, CAPILLARY
GLUCOSE-CAPILLARY: 345 mg/dL — AB (ref 70–99)
GLUCOSE-CAPILLARY: 260 mg/dL — AB (ref 70–99)
Glucose-Capillary: 474 mg/dL — ABNORMAL HIGH (ref 70–99)
Glucose-Capillary: 560 mg/dL (ref 70–99)
Glucose-Capillary: 510 mg/dL (ref 70–99)
Glucose-Capillary: 476 mg/dL — ABNORMAL HIGH (ref 70–99)
Glucose-Capillary: 389 mg/dL — ABNORMAL HIGH (ref 70–99)
Glucose-Capillary: 219 mg/dL — ABNORMAL HIGH (ref 70–99)
Glucose-Capillary: 185 mg/dL — ABNORMAL HIGH (ref 70–99)
Glucose-Capillary: 149 mg/dL — ABNORMAL HIGH (ref 70–99)
Glucose-Capillary: 144 mg/dL — ABNORMAL HIGH (ref 70–99)

## 2018-08-16 LAB — CBC WITH DIFFERENTIAL/PLATELET
Abs Immature Granulocytes: 0.02 10*3/uL (ref 0.00–0.07)
Basophils Absolute: 0 10*3/uL (ref 0.0–0.1)
Basophils Relative: 1 %
Eosinophils Absolute: 0.3 10*3/uL (ref 0.0–0.5)
Eosinophils Relative: 4 %
HCT: 36.5 % (ref 36.0–46.0)
Hemoglobin: 11.7 g/dL — ABNORMAL LOW (ref 12.0–15.0)
IMMATURE GRANULOCYTES: 0 %
LYMPHS ABS: 2.8 10*3/uL (ref 0.7–4.0)
Lymphocytes Relative: 41 %
MCH: 33.2 pg (ref 26.0–34.0)
MCHC: 32.1 g/dL (ref 30.0–36.0)
MCV: 103.7 fL — ABNORMAL HIGH (ref 80.0–100.0)
Monocytes Absolute: 0.4 10*3/uL (ref 0.1–1.0)
Monocytes Relative: 5 %
Neutro Abs: 3.2 10*3/uL (ref 1.7–7.7)
Neutrophils Relative %: 49 %
Platelets: 154 10*3/uL (ref 150–400)
RBC: 3.52 MIL/uL — ABNORMAL LOW (ref 3.87–5.11)
RDW: 13.7 % (ref 11.5–15.5)
WBC: 6.7 10*3/uL (ref 4.0–10.5)
nRBC: 0 % (ref 0.0–0.2)

## 2018-08-16 LAB — BASIC METABOLIC PANEL
Anion gap: 10 (ref 5–15)
Anion gap: 10 (ref 5–15)
BUN: 25 mg/dL — AB (ref 8–23)
BUN: 27 mg/dL — ABNORMAL HIGH (ref 8–23)
CO2: 28 mmol/L (ref 22–32)
CO2: 26 mmol/L (ref 22–32)
Calcium: 8.3 mg/dL — ABNORMAL LOW (ref 8.9–10.3)
Calcium: 8.3 mg/dL — ABNORMAL LOW (ref 8.9–10.3)
Chloride: 93 mmol/L — ABNORMAL LOW (ref 98–111)
Chloride: 98 mmol/L (ref 98–111)
Creatinine, Ser: 2.64 mg/dL — ABNORMAL HIGH (ref 0.44–1.00)
Creatinine, Ser: 2.61 mg/dL — ABNORMAL HIGH (ref 0.44–1.00)
GFR calc Af Amer: 21 mL/min — ABNORMAL LOW (ref >60)
GFR calc Af Amer: 21 mL/min — ABNORMAL LOW (ref >60)
GFR calc non Af Amer: 18 mL/min — ABNORMAL LOW (ref >60)
GFR calc non Af Amer: 18 mL/min — ABNORMAL LOW (ref >60)
GLUCOSE: 540 mg/dL — AB (ref 70–99)
Glucose, Bld: 366 mg/dL — ABNORMAL HIGH (ref 70–99)
Potassium: 3.6 mmol/L (ref 3.5–5.1)
Potassium: 3.4 mmol/L — ABNORMAL LOW (ref 3.5–5.1)
Sodium: 131 mmol/L — ABNORMAL LOW (ref 135–145)
Sodium: 134 mmol/L — ABNORMAL LOW (ref 135–145)

## 2018-08-16 LAB — MAGNESIUM: Magnesium: 2 mg/dL (ref 1.7–2.4)

## 2018-08-16 LAB — HEMOGLOBIN A1C
Hgb A1c MFr Bld: 10.5 % — ABNORMAL HIGH (ref 4.8–5.6)
Hgb A1c MFr Bld: 10.6 % — ABNORMAL HIGH (ref 4.8–5.6)
Mean Plasma Glucose: 254.65 mg/dL
Mean Plasma Glucose: 257.52 mg/dL

## 2018-08-16 LAB — HIV ANTIBODY (ROUTINE TESTING W REFLEX): HIV SCREEN 4TH GENERATION: NONREACTIVE

## 2018-08-16 LAB — HEPATIC FUNCTION PANEL
ALT: 15 U/L (ref 0–44)
AST: 19 U/L (ref 15–41)
Albumin: 3.4 g/dL — ABNORMAL LOW (ref 3.5–5.0)
Alkaline Phosphatase: 115 U/L (ref 38–126)
BILIRUBIN DIRECT: 0.1 mg/dL (ref 0.0–0.2)
Indirect Bilirubin: 0.4 mg/dL (ref 0.3–0.9)
Total Bilirubin: 0.5 mg/dL (ref 0.3–1.2)
Total Protein: 8.3 g/dL — ABNORMAL HIGH (ref 6.5–8.1)

## 2018-08-16 LAB — D-DIMER, QUANTITATIVE: D-Dimer, Quant: 1.23 ug/mL-FEU — ABNORMAL HIGH (ref 0.00–0.50)

## 2018-08-16 LAB — CBG MONITORING, ED: Glucose-Capillary: 374 mg/dL — ABNORMAL HIGH (ref 70–99)

## 2018-08-16 LAB — SODIUM, URINE, RANDOM: Sodium, Ur: <10 mmol/L

## 2018-08-16 LAB — TROPONIN I: Troponin I: <0.03 ng/mL (ref <0.03)

## 2018-08-16 MED ORDER — ALBUTEROL SULFATE (2.5 MG/3ML) 0.083% IN NEBU: 2.5000 mg | INHALATION_SOLUTION | Freq: Four times a day (QID) | RESPIRATORY_TRACT | Status: DC | PRN

## 2018-08-16 MED ORDER — LIVING WELL WITH DIABETES BOOK
Freq: Once | Status: AC
  Administered 2018-08-16: 13:00:00
  Filled 2018-08-16: qty 1

## 2018-08-16 MED ORDER — METOPROLOL SUCCINATE ER 50 MG PO TB24
100.0000 mg | ORAL_TABLET | Freq: Every day | ORAL | Status: DC
  Administered 2018-08-16 – 2018-08-18 (×3): 100 mg via ORAL
  Filled 2018-08-16 (×3): qty 2

## 2018-08-16 MED ORDER — ZOLPIDEM TARTRATE 5 MG PO TABS
5.0000 mg | ORAL_TABLET | Freq: Every day | ORAL | Status: DC
  Administered 2018-08-16 – 2018-08-17 (×3): 5 mg via ORAL
  Filled 2018-08-16 (×3): qty 1

## 2018-08-16 MED ORDER — HYDRALAZINE HCL 20 MG/ML IJ SOLN: 10.0000 mg | INTRAMUSCULAR | Status: DC | PRN

## 2018-08-16 MED ORDER — INSULIN ASPART 100 UNIT/ML ~~LOC~~ SOLN
0.0000 [IU] | Freq: Three times a day (TID) | SUBCUTANEOUS | Status: DC
  Administered 2018-08-16: 9 [IU] via SUBCUTANEOUS

## 2018-08-16 MED ORDER — INSULIN GLARGINE 100 UNIT/ML ~~LOC~~ SOLN
30.0000 [IU] | Freq: Every day | SUBCUTANEOUS | Status: DC
  Administered 2018-08-16 – 2018-08-17 (×2): 30 [IU] via SUBCUTANEOUS
  Filled 2018-08-16 (×3): qty 0.3

## 2018-08-16 MED ORDER — ONDANSETRON HCL 4 MG/2ML IJ SOLN: 4.0000 mg | Freq: Four times a day (QID) | INTRAMUSCULAR | Status: DC | PRN

## 2018-08-16 MED ORDER — INSULIN REGULAR(HUMAN) IN NACL 100-0.9 UT/100ML-% IV SOLN
INTRAVENOUS | Status: AC
  Administered 2018-08-16: 5 [IU]/h via INTRAVENOUS
  Filled 2018-08-16: qty 100

## 2018-08-16 MED ORDER — SODIUM CHLORIDE 0.9 % IV SOLN
INTRAVENOUS | Status: DC
  Administered 2018-08-16 – 2018-08-17 (×2): via INTRAVENOUS

## 2018-08-16 MED ORDER — INSULIN ASPART 100 UNIT/ML ~~LOC~~ SOLN
0.0000 [IU] | Freq: Three times a day (TID) | SUBCUTANEOUS | Status: DC
  Administered 2018-08-17: 3 [IU] via SUBCUTANEOUS
  Administered 2018-08-17 (×2): 11 [IU] via SUBCUTANEOUS
  Administered 2018-08-18: 7 [IU] via SUBCUTANEOUS
  Administered 2018-08-18: 3 [IU] via SUBCUTANEOUS

## 2018-08-16 MED ORDER — INSULIN GLARGINE 100 UNIT/ML ~~LOC~~ SOLN
16.0000 [IU] | Freq: Every day | SUBCUTANEOUS | Status: DC
  Administered 2018-08-16: 16 [IU] via SUBCUTANEOUS
  Filled 2018-08-16: qty 0.16

## 2018-08-16 MED ORDER — ASPIRIN EC 81 MG PO TBEC
81.0000 mg | DELAYED_RELEASE_TABLET | Freq: Every day | ORAL | Status: DC
  Administered 2018-08-16 – 2018-08-18 (×3): 81 mg via ORAL
  Filled 2018-08-16 (×3): qty 1

## 2018-08-16 MED ORDER — DEXTROSE 50 % IV SOLN: 25.0000 mL | INTRAVENOUS | Status: DC | PRN

## 2018-08-16 MED ORDER — SODIUM CHLORIDE 0.9 % IV SOLN
INTRAVENOUS | Status: DC
  Administered 2018-08-16: 13:00:00 via INTRAVENOUS

## 2018-08-16 MED ORDER — BUSPIRONE HCL 5 MG PO TABS
15.0000 mg | ORAL_TABLET | Freq: Two times a day (BID) | ORAL | Status: DC
  Administered 2018-08-16 – 2018-08-18 (×5): 15 mg via ORAL
  Filled 2018-08-16 (×5): qty 1

## 2018-08-16 MED ORDER — FAMOTIDINE 20 MG PO TABS
20.0000 mg | ORAL_TABLET | Freq: Every day | ORAL | Status: DC
  Administered 2018-08-16 – 2018-08-18 (×3): 20 mg via ORAL
  Filled 2018-08-16 (×5): qty 1

## 2018-08-16 MED ORDER — DEXTROSE-NACL 5-0.45 % IV SOLN
INTRAVENOUS | Status: AC
  Administered 2018-08-16: 18:00:00 via INTRAVENOUS

## 2018-08-16 MED ORDER — ENOXAPARIN SODIUM 30 MG/0.3ML ~~LOC~~ SOLN
30.0000 mg | SUBCUTANEOUS | Status: DC
  Administered 2018-08-16: 30 mg via SUBCUTANEOUS
  Filled 2018-08-16: qty 0.3

## 2018-08-16 MED ORDER — GLIPIZIDE 5 MG PO TABS
2.5000 mg | ORAL_TABLET | Freq: Two times a day (BID) | ORAL | Status: DC
  Administered 2018-08-16: 2.5 mg via ORAL
  Filled 2018-08-16: qty 1

## 2018-08-16 MED ORDER — ONDANSETRON HCL 4 MG PO TABS: 4.0000 mg | ORAL_TABLET | Freq: Four times a day (QID) | ORAL | Status: DC | PRN

## 2018-08-16 MED ORDER — LORAZEPAM 1 MG PO TABS
1.0000 mg | ORAL_TABLET | Freq: Every day | ORAL | Status: DC
  Administered 2018-08-16 – 2018-08-17 (×3): 1 mg via ORAL
  Filled 2018-08-16 (×3): qty 1

## 2018-08-16 MED ORDER — INSULIN ASPART 100 UNIT/ML ~~LOC~~ SOLN
4.0000 [IU] | Freq: Once | SUBCUTANEOUS | Status: AC
  Administered 2018-08-16: 4 [IU] via SUBCUTANEOUS

## 2018-08-16 MED ORDER — LEVOTHYROXINE SODIUM 50 MCG PO TABS
50.0000 ug | ORAL_TABLET | Freq: Every day | ORAL | Status: DC
  Administered 2018-08-16 – 2018-08-18 (×3): 50 ug via ORAL
  Filled 2018-08-16 (×3): qty 1

## 2018-08-16 MED ORDER — FLUTICASONE PROPIONATE 50 MCG/ACT NA SUSP
2.0000 | Freq: Every day | NASAL | Status: DC | PRN
  Filled 2018-08-16: qty 16

## 2018-08-16 MED ORDER — QUETIAPINE FUMARATE 100 MG PO TABS
100.0000 mg | ORAL_TABLET | Freq: Every day | ORAL | Status: DC
  Administered 2018-08-16: 100 mg via ORAL
  Filled 2018-08-16: qty 1

## 2018-08-16 MED ORDER — ACETAMINOPHEN 325 MG PO TABS: 650.0000 mg | ORAL_TABLET | Freq: Four times a day (QID) | ORAL | Status: DC | PRN

## 2018-08-16 MED ORDER — INSULIN REGULAR BOLUS VIA INFUSION
0.0000 [IU] | Freq: Three times a day (TID) | INTRAVENOUS | Status: DC
  Administered 2018-08-16: 6.6 [IU] via INTRAVENOUS
  Filled 2018-08-16: qty 10

## 2018-08-16 MED ORDER — INSULIN ASPART 100 UNIT/ML ~~LOC~~ SOLN: 0.0000 [IU] | Freq: Every day | SUBCUTANEOUS | Status: DC

## 2018-08-16 MED ORDER — ACETAMINOPHEN 650 MG RE SUPP: 650.0000 mg | Freq: Four times a day (QID) | RECTAL | Status: DC | PRN

## 2018-08-16 NOTE — Progress Notes (Signed)
Note IV Insulin drip started at 1pm for severely elevated CBGs.  Spoke with pt about new diagnosis earlier this afternoon.  Pt seemed slightly overwhelmed but very open to all info provided.  Son and grandchildren at bedside.  Discussed basic pathophysiology of DM Type 2, basic home care, basic diabetes diet nutrition principles, importance of checking CBGs and maintaining good CBG control to prevent long-term and short-term complications.    RNs to provide ongoing basic DM education at bedside with this patient.  Have ordered educational booklet, insulin starter kit, and DM videos.  Have also placed RD consult for DM diet education for this patient.  Spoke with RN Shanon Brow who is caring for pt today.  Asked RN to please teach pt how to check CBGs and administer insulin (when she is off the IV Insulin drip).  Plan to revisit with pt tomorrow (12/06).   --Will follow patient during hospitalization--  Wyn Quaker RN, MSN, CDE Diabetes Coordinator Inpatient Glycemic Control Team Team Pager: [phone] (8a-5p)

## 2018-08-16 NOTE — H&P (Signed)
History and Physical    Sue [name] DGL:[id number] DOB: 08/03/1951 DOA: 08/15/2018  PCP: Ann Held, DO  Patient coming from: Home.  Chief Complaint: Shortness of breath and weakness.  HPI: Sue [name] is a 67 y.o. female with history of diastolic CHF, COPD, chronic kidney disease stage III creatinine around 1.5, hypothyroidism, hypertension has been experiencing increasing shortness of breath over the last 1 month with some cough nonproductive.  Over the last few days patient has been feeling fatigued weak and increased urination and feeling dry.  Due to these symptoms being persistent patient came to the ER at [facility].  Denies chest pain or fever chills vomiting or diarrhea or abdominal pain.  ED Course: In the ER patient is found to have a blood sugar of 579 with anion gap of 9.  Chest x-ray is unremarkable EKG shows nonspecific changes.  Patient's creatinine has increased from 1.5 recently to 2.8.  Patient's UA is unremarkable except for glucose more than 500.  Patient was given fluid bolus and insulin 10 units IV and admitted for new onset diabetes mellitus type 2 and also renal failure with shortness of breath.  Review of Systems: As per HPI, rest all negative.   Past Medical History:  Diagnosis Date  . Allergic rhinitis   . Depression   . Emphysema of lung ([place])   . GERD (gastroesophageal reflux disease)   . Hypertension   . Hypothyroidism   . Stroke [facility]) 2016   TIA   . Urine incontinence     Past Surgical History:  Procedure Laterality Date  . ABDOMINAL HYSTERECTOMY    . CESAREAN SECTION       reports that she quit smoking about 6 years ago. Her smoking use included cigarettes. She started smoking about 46 years ago. She has a 40.00 pack-year smoking history. She has never used smokeless tobacco. She reports that she drinks alcohol. She reports that she does not use drugs.  Allergies  Allergen Reactions  . Norvasc [Amlodipine Besylate]       Marked swelling    Family History  Problem Relation Age of Onset  . Heart disease Father        MVP and Pics Valve  . Hypertension Father   . Depression Father        Institutionalized x's 2 years  . Bipolar disorder Father   . Hypertension Sister   . Diabetes Sister   . Hyperlipidemia Sister   . Heart disease Sister 59       MI  . Heart disease Brother   . Hypertension Brother   . Heart disease Paternal Grandmother   . Heart disease Paternal Aunt   . Heart disease Paternal Uncle   . Schizophrenia Paternal Aunt   . Asthma Son   . Asthma Son     Prior to Admission medications   Medication Sig Start Date End Date Taking? Authorizing Provider  albuterol (PROVENTIL HFA;VENTOLIN HFA) 108 (90 Base) MCG/ACT inhaler Inhale 1-2 puffs into the lungs every 6 (six) hours as needed for wheezing or shortness of breath.   Yes [provider]  albuterol (PROVENTIL) (2.5 MG/3ML) 0.083% nebulizer solution Take 3 mLs (2.5 mg total) by nebulization every 6 (six) hours as needed for wheezing or shortness of breath. 03/31/17  Yes Ann Held, DO  aspirin EC 81 MG EC tablet Take 1 tablet (81 mg total) by mouth daily. 05/03/17  Yes Murlean Iba, MD  busPIRone (  BUSPAR) 15 MG tablet Take 22 mg by mouth daily.  12/27/17  Yes [provider]  fluticasone (FLONASE) 50 MCG/ACT nasal spray Place 2 sprays into both nostrils daily as needed for allergies. 05/02/17  Yes Johnson, Clanford L, MD  levothyroxine (SYNTHROID, LEVOTHROID) 50 MCG tablet TAKE ONE TABLET BY MOUTH ONCE DAILY 10/04/17  Yes Roma Schanz R, DO  lisinopril (PRINIVIL,ZESTRIL) 20 MG tablet Take 1 tablet (20 mg total) by mouth daily. 02/21/18 08/16/18 Yes Burtis Junes, NP  LORazepam (ATIVAN) 2 MG tablet Take 0.5-1 mg by mouth as directed. 0.5 Mg in the afternoon and 1 MG at bedtime 01/31/17  Yes Lowne Chase, Kendrick Fries R, DO  metoprolol succinate (TOPROL-XL) 50 MG 24 hr tablet TAKE 2 TABLETS BY MOUTH ONCE DAILY  IN THE MORNING WITH A MEAL OR IMMEDIATELY FOLLOWING A MEAL Patient taking differently: Take 100 mg by mouth daily.  07/24/18  Yes Roma Schanz R, DO  potassium chloride SA (K-DUR,KLOR-CON) 20 MEQ tablet TAKE 1 TABLET BY MOUTH ONCE DAILY *NEED  OFFICE  VISIT* Patient taking differently: Take 20 mEq by mouth daily.  07/24/18  Yes Roma Schanz R, DO  QUEtiapine (SEROQUEL) 50 MG tablet Take 100 mg by mouth at bedtime.  11/09/15  Yes [provider]  ranitidine (ZANTAC) 150 MG tablet TAKE 1 TABLET BY MOUTH ONCE DAILY AT BEDTIME 02/08/18  Yes Tanda Rockers, MD  torsemide (DEMADEX) 20 MG tablet Take 1 tablet (20 mg total) by mouth daily. 02/01/18 08/16/18 Yes Burtis Junes, NP  zolpidem (AMBIEN) 5 MG tablet Take 2 tablets (10 mg total) by mouth at bedtime. 05/02/17  Yes Johnson, Clanford L, MD  OXYGEN Inhale 3 L into the lungs continuous.     [provider]    Physical Exam: Vitals:   08/15/18 2200 08/15/18 2330 08/16/18 0000 08/16/18 0107  BP: (!) 127/95 (!) 114/100 119/78 107/75  Pulse: 91 80  86  Resp:  16 14 (!) 22  Temp:    (!) 97.3 F (36.3 C)  TempSrc:    Oral  SpO2: 100% 99% 99% 99%  Weight:      Height:          Constitutional: Moderately built and nourished. Vitals:   08/15/18 2200 08/15/18 2330 08/16/18 0000 08/16/18 0107  BP: (!) 127/95 (!) 114/100 119/78 107/75  Pulse: 91 80  86  Resp:  16 14 (!) 22  Temp:    (!) 97.3 F (36.3 C)  TempSrc:    Oral  SpO2: 100% 99% 99% 99%  Weight:      Height:       Eyes: Anicteric no pallor. ENMT: No discharge from the ears eyes nose or mouth. Neck: No mass felt.  No neck rigidity.  No JVD appreciated. Respiratory: No rhonchi or crepitations. Cardiovascular: S1-S2 heard. Abdomen: Soft nontender bowel sounds present. Musculoskeletal: No edema.  No joint effusion. Skin: No rash. Neurologic: Alert awake oriented to time place and person.  Moves all extremities. Psychiatric: Appears normal per normal  affect.   Labs on Admission: I have personally reviewed following labs and imaging studies  CBC: Recent Labs  Lab 08/15/18 2049  WBC 6.2  NEUTROABS 3.5  HGB 11.6*  HCT 36.2  MCV 102.8*  PLT 962   Basic Metabolic Panel: Recent Labs  Lab 08/15/18 2049  NA 127*  K 4.1  CL 90*  CO2 28  GLUCOSE 579*  BUN 24*  CREATININE 2.80*  CALCIUM 8.5*  GFR: Estimated Creatinine Clearance: 26.2 mL/min (A) (by C-G formula based on SCr of 2.8 mg/dL (H)). Liver Function Tests: No results for input(s): AST, ALT, ALKPHOS, BILITOT, PROT, ALBUMIN in the last 168 hours. No results for input(s): LIPASE, AMYLASE in the last 168 hours. No results for input(s): AMMONIA in the last 168 hours. Coagulation Profile: No results for input(s): INR, PROTIME in the last 168 hours. Cardiac Enzymes: Recent Labs  Lab 08/15/18 2049  TROPONINI <0.03   BNP (last 3 results) Recent Labs    01/17/18 1616  PROBNP 42   HbA1C: No results for input(s): HGBA1C in the last 72 hours. CBG: Recent Labs  Lab 08/15/18 2249 08/16/18 0009  GLUCAP 364* 374*   Lipid Profile: No results for input(s): CHOL, HDL, LDLCALC, TRIG, CHOLHDL, LDLDIRECT in the last 72 hours. Thyroid Function Tests: No results for input(s): TSH, T4TOTAL, FREET4, T3FREE, THYROIDAB in the last 72 hours. Anemia Panel: No results for input(s): VITAMINB12, FOLATE, FERRITIN, TIBC, IRON, RETICCTPCT in the last 72 hours. Urine analysis:    Component Value Date/Time   COLORURINE YELLOW 08/15/2018 2201   APPEARANCEUR CLEAR 08/15/2018 2201   LABSPEC <1.005 (L) 08/15/2018 2201   PHURINE 5.5 08/15/2018 2201   GLUCOSEU >=500 (A) 08/15/2018 2201   GLUCOSEU NEGATIVE 11/11/2013 1353   HGBUR NEGATIVE 08/15/2018 2201   BILIRUBINUR NEGATIVE 08/15/2018 2201   BILIRUBINUR neg 10/27/2015 1355   KETONESUR NEGATIVE 08/15/2018 2201   PROTEINUR NEGATIVE 08/15/2018 2201   UROBILINOGEN 0.2 10/27/2015 1355   UROBILINOGEN 0.2 07/29/2014 2300   NITRITE  NEGATIVE 08/15/2018 2201   LEUKOCYTESUR NEGATIVE 08/15/2018 2201   Sepsis Labs: @LABRCNTIP (procalcitonin:4,lacticidven:4) )No results found for this or any previous visit (from the past 240 hour(s)).   Radiological Exams on Admission: Dg Chest 2 View  Result Date: 08/15/2018 CLINICAL DATA:  Dyspnea times 3-4 weeks. EXAM: CHEST - 2 VIEW COMPARISON:  10/19/2017 FINDINGS: The heart size and mediastinal contours are within normal limits. Chronic diffuse mild bilateral interstitial pulmonary changes are stable. No alveolar consolidation, effusion or pneumothorax. The visualized skeletal structures are unremarkable. IMPRESSION: Stable interstitial prominence of the lungs, nonspecific but may reflect chronic interstitial lung disease. Electronically Signed   By: Ashley Royalty M.D.   On: 08/15/2018 20:02    EKG: Independently reviewed.  Normal sinus rhythm with nonspecific cystic changes in the inferior leads comparable to old EKG.  Assessment/Plan Principal Problem:   AKI (acute kidney injury) ([place]) Active Problems:   Chronic respiratory failure (H[facility])   Mild diastolic dysfunction   Hypothyroidism   Pulmonary emphysema (HCC)   Hyperglycemia   ARF (acute renal failure) ([place])    1. Acute renal failure likely from poor oral intake dehydration with new onset diabetes mellitus type 2 -we will hold patient's ACE inhibitor and torsemide for now.  Patient did receive 100 cc normal saline bolus in the ER.  Will recheck metabolic panel and based on which we will consider further fluids or not. 2. Hyperglycemia with new onset diabetes mellitus type 2 we will check hemoglobin A1c.  Patient is on sliding scale coverage for now.  Did receive tenderness her IV NovoLog in the ER. 3. Acute respiratory failure with hypoxia -has been feeling short of breath for last few weeks.  Will check BNP and d-dimer.  Not actively wheezing on my exam.  Continue home inhalers. 4. Hypothyroidism on Synthroid. 5. Chronic  diastolic CHF last EF measured was 65 to 70% with grade 1 diastolic dysfunction in July 2018.  Holding torsemide  due to renal failure. 6. Hypertension -due to renal failure holding ACE inhibitor is in keeping patient on PRN IV hydralazine. 7. COPD continue home inhalers. 8. History of depression on Seroquel and BuSpar. 9. Mild hyponatremia likely from hyperglycemia and dehydration.  Follow metabolic panel after hydration and control of blood sugar. 10. Anemia appears to be chronic.  Follow CBC. 11. Morbid obesity.  Note that patient's home medication doses has to be reconfirmed.  Patient only knows some of them.   DVT prophylaxis: Lovenox. Code Status: Full code. Family Communication: Patient's daughter. Disposition Plan: Home. Consults called: None. Admission status: Observation.   Rise Patience MD [facility] Pager [phone].  If 7PM-7AM, please contact night-coverage [email or web address] Password [email or web address]  08/16/2018, 2:59 AM

## 2018-08-16 NOTE — Progress Notes (Signed)
RN contacted  the admission Md.Via amion.

## 2018-08-16 NOTE — Progress Notes (Signed)
PROGRESS NOTE    Sue [name]  JJH:[id number] DOB: 1951-02-11 DOA: 08/15/2018 PCP: Ann Held, DO      Brief Narrative:  TORRIN FREIN is a 67 y.o. female with history of diastolic CHF, COPD, chronic kidney disease stage III creatinine around 1.5, hypothyroidism, hypertension has been experiencing increasing shortness of breath over the last 1 month with some cough nonproductive.  Over the last few days patient has been feeling fatigued weak and increased urination and feeling dry.  Due to these symptoms being persistent patient came to the ER at [facility].  Denies chest pain or fever chills vomiting or diarrhea or abdominal pain.  In the ER found to have BG 579 with normal AG.  CXR clear.  Creatinine 2.8 from baseline 1.5.  Started on fluids and subcutaneous insulin.   Assessment & Plan:  Diabetes Unable to control with subcutaneous insulin this morning. -Start insulin gtt -Hourly glucose checks -Start lantus at bedtime and turn off drip -Continue IV fluids with NS overnight -Continue correction insulin overnight  AKI on CKD III Baseline Cr 1.5.  Currently 2.6, stable from yesterday.  Fena low.   -Continue IV fluids -Hold torsemide and ARB  COPD flare -Hold steroids -Continue bronchodilators  HTN urgency -Continue labetalol and hydralazine PRN -Continue metoprolol -Continue aspirin  Hypothyroidism  -Continue levothyroxine  Bipolar -Continue Buspar -Hold Seroquel   DVT prophylaxis: Lovenox Code Status: FULL Family Communication: Son  MDM and disposition Plan: This is a no charge note.  For further details, please see H&P by my partner Dr. Hal Hope from earlier today.  The below labs and imaging reports were reviewed and summarized above.    The patient was admitted with new onset Diabetes with hyperglycemia and AKI    Objective: Vitals:   08/15/18 2330 08/16/18 0000 08/16/18 0107 08/16/18 0356  BP: (!) 114/100 119/78 107/75 (!)  111/56  Pulse: 80  86 92  Resp: 16 14 (!) 22 (!) 21  Temp:   (!) 97.3 F (36.3 C) 97.9 F (36.6 C)  TempSrc:   Oral Oral  SpO2: 99% 99% 99% 94%  Weight:      Height:        Intake/Output Summary (Last 24 hours) at 08/16/2018 1834 Last data filed at 08/16/2018 1341 Gross per 24 hour  Intake 770.01 ml  Output -  Net 770.01 ml   Filed Weights   08/15/18 1829  Weight: 131.5 kg    Examination: The patient was seen and examined.      Data Reviewed: I have personally reviewed following labs and imaging studies:  CBC: Recent Labs  Lab 08/15/18 2049 08/16/18 0257  WBC 6.2 6.7  NEUTROABS 3.5 3.2  HGB 11.6* 11.7*  HCT 36.2 36.5  MCV 102.8* 103.7*  PLT 154 818   Basic Metabolic Panel: Recent Labs  Lab 08/15/18 2049 08/16/18 0612 08/16/18 1621  NA 127* 131* 134*  K 4.1 3.6 3.4*  CL 90* 93* 98  CO2 28 28 26   GLUCOSE 579* 540* 366*  BUN 24* 25* 27*  CREATININE 2.80* 2.64* 2.61*  CALCIUM 8.5* 8.3* 8.3*  MG  --  2.0  --    GFR: Estimated Creatinine Clearance: 28.1 mL/min (A) (by C-G formula based on SCr of 2.61 mg/dL (H)). Liver Function Tests: Recent Labs  Lab 08/16/18 0612  AST 19  ALT 15  ALKPHOS 115  BILITOT 0.5  PROT 8.3*  ALBUMIN 3.4*   No results for input(s): LIPASE, AMYLASE  in the last 168 hours. No results for input(s): AMMONIA in the last 168 hours. Coagulation Profile: No results for input(s): INR, PROTIME in the last 168 hours. Cardiac Enzymes: Recent Labs  Lab 08/15/18 2049 08/16/18 0612  TROPONINI <0.03 <0.03   BNP (last 3 results) Recent Labs    01/17/18 1616  PROBNP 42   HbA1C: Recent Labs    08/15/18 2049 08/16/18 0612  HGBA1C 10.5* 10.6*   CBG: Recent Labs  Lab 08/16/18 1416 08/16/18 1459 08/16/18 1600 08/16/18 1705 08/16/18 1802  GLUCAP 510* 476* 389* 260* 219*   Lipid Profile: No results for input(s): CHOL, HDL, LDLCALC, TRIG, CHOLHDL, LDLDIRECT in the last 72 hours. Thyroid Function Tests: No results for  input(s): TSH, T4TOTAL, FREET4, T3FREE, THYROIDAB in the last 72 hours. Anemia Panel: No results for input(s): VITAMINB12, FOLATE, FERRITIN, TIBC, IRON, RETICCTPCT in the last 72 hours. Urine analysis:    Component Value Date/Time   COLORURINE YELLOW 08/15/2018 2201   APPEARANCEUR CLEAR 08/15/2018 2201   LABSPEC <1.005 (L) 08/15/2018 2201   PHURINE 5.5 08/15/2018 2201   GLUCOSEU >=500 (A) 08/15/2018 2201   GLUCOSEU NEGATIVE 11/11/2013 1353   HGBUR NEGATIVE 08/15/2018 2201   BILIRUBINUR NEGATIVE 08/15/2018 2201   BILIRUBINUR neg 10/27/2015 1355   KETONESUR NEGATIVE 08/15/2018 2201   PROTEINUR NEGATIVE 08/15/2018 2201   UROBILINOGEN 0.2 10/27/2015 1355   UROBILINOGEN 0.2 07/29/2014 2300   NITRITE NEGATIVE 08/15/2018 2201   LEUKOCYTESUR NEGATIVE 08/15/2018 2201   Sepsis Labs: @LABRCNTIP (procalcitonin:4,lacticacidven:4)  )No results found for this or any previous visit (from the past 240 hour(s)).       Radiology Studies: Dg Chest 2 View  Result Date: 08/15/2018 CLINICAL DATA:  Dyspnea times 3-4 weeks. EXAM: CHEST - 2 VIEW COMPARISON:  10/19/2017 FINDINGS: The heart size and mediastinal contours are within normal limits. Chronic diffuse mild bilateral interstitial pulmonary changes are stable. No alveolar consolidation, effusion or pneumothorax. The visualized skeletal structures are unremarkable. IMPRESSION: Stable interstitial prominence of the lungs, nonspecific but may reflect chronic interstitial lung disease. Electronically Signed   By: Ashley Royalty M.D.   On: 08/15/2018 20:02        Scheduled Meds: . aspirin EC  81 mg Oral Daily  . busPIRone  15 mg Oral BID  . enoxaparin (LOVENOX) injection  30 mg Subcutaneous Q24H  . famotidine  20 mg Oral Daily  . insulin glargine  30 Units Subcutaneous QHS  . insulin regular  0-10 Units Intravenous TID WC  . levothyroxine  50 mcg Oral Q0600  . LORazepam  1 mg Oral QHS  . metoprolol succinate  100 mg Oral Daily  . zolpidem  5 mg  Oral QHS   Continuous Infusions: . dextrose 5 % and 0.45% NaCl 75 mL/hr at 08/16/18 1820  . insulin 6.4 Units/hr (08/16/18 1819)     LOS: 0 days    Time spent: 25 minutes    Edwin Dada, MD [facility] 08/16/2018, 6:34 PM     Pager [phone] --- please page though AMION:  [email or web address] Password [email or web address] If 7PM-7AM, please contact night-coverage

## 2018-08-16 NOTE — Progress Notes (Signed)
Inpatient Diabetes Program Recommendations  AACE/ADA: New Consensus Statement on Inpatient Glycemic Control (2015)  Target Ranges:  Prepandial:   less than 140 mg/dL      Peak postprandial:   less than 180 mg/dL (1-2 hours)      Critically ill patients:  140 - 180 mg/dL   Results for Sue [name], Sue [name] (MRN [id number]) as of 08/16/2018 08:49  Ref. Range 08/15/2018 22:49 08/16/2018 00:09 08/16/2018 01:31 08/16/2018 07:54  Glucose-Capillary Latest Ref Range: 70 - 99 mg/dL 364 (H)  10 units Regular Insulin given at 9:44pm 374 (H) 345 (H) 474 (H)  9 units NOVOLOG +  16 units LANTUS   Results for Sue [name], Sue [name] (MRN [id number]) as of 08/16/2018 08:49  Ref. Range 08/16/2018 06:12  Hemoglobin A1C Latest Ref Range: 4.8 - 5.6 % 10.6 (H)  (257 mg/dl)    Admit with: SOB/ Acute Kidney Injury/ Hyperglycemia/ New Diagnosis of DM  History: CHF, COPD, CKD  Current Orders: Lantus 16 units Daily      Novolog Sensitive Correction Scale/ SSI (0-9 units) TID AC       Glipizide 2.5 mg BID     Note Lantus and Glipizide both started this AM.  New diagnosis of DM noted.  Will order educational materials and RD consult for DM diet education.  Plan to see patient today.     --Will follow patient during hospitalization--  Wyn Quaker RN, MSN, CDE Diabetes Coordinator Inpatient Glycemic Control Team Team Pager: [phone] (8a-5p)

## 2018-08-16 NOTE — Plan of Care (Signed)
  RD consulted for nutrition education regarding diabetes.   Lab Results  Component Value Date   HGBA1C 10.6 (H) 08/16/2018    RD provided "Carbohydrate Counting for People with Diabetes" handout from the Academy of Nutrition and Dietetics. Discussed different food groups and their effects on blood sugar, emphasizing carbohydrate-containing foods. Provided list of carbohydrates and recommended serving sizes of common foods.  Discussed importance of controlled and consistent carbohydrate intake throughout the day. Provided examples of ways to balance meals/snacks and encouraged intake of high-fiber, whole grain complex carbohydrates. Teach back method used.  Expect fair compliance. I think patient would benefit from outpatient education as well.   Body mass index is 51.37 kg/m. Pt meets criteria for morbid obesity based on current BMI.  Current diet order is Heart Healthy/CHO modified.Labs and medications reviewed. No further nutrition interventions warranted at this time.  If additional nutrition issues arise, please re-consult RD.  Clayton Bibles, MS, RD, [place] Dietitian Pager: [phone] After Hours Pager: [phone]

## 2018-08-17 DIAGNOSIS — I130 Hypertensive heart and chronic kidney disease with heart failure and stage 1 through stage 4 chronic kidney disease, or unspecified chronic kidney disease: Secondary | ICD-10-CM | POA: Diagnosis present

## 2018-08-17 DIAGNOSIS — E1165 Type 2 diabetes mellitus with hyperglycemia: Secondary | ICD-10-CM | POA: Diagnosis present

## 2018-08-17 DIAGNOSIS — Z23 Encounter for immunization: Secondary | ICD-10-CM | POA: Diagnosis present

## 2018-08-17 DIAGNOSIS — N179 Acute kidney failure, unspecified: Secondary | ICD-10-CM | POA: Diagnosis present

## 2018-08-17 DIAGNOSIS — Z6841 Body Mass Index (BMI) 40.0 and over, adult: Secondary | ICD-10-CM | POA: Diagnosis not present

## 2018-08-17 DIAGNOSIS — E871 Hypo-osmolality and hyponatremia: Secondary | ICD-10-CM | POA: Diagnosis present

## 2018-08-17 DIAGNOSIS — E039 Hypothyroidism, unspecified: Secondary | ICD-10-CM | POA: Diagnosis present

## 2018-08-17 DIAGNOSIS — I160 Hypertensive urgency: Secondary | ICD-10-CM | POA: Diagnosis present

## 2018-08-17 DIAGNOSIS — F319 Bipolar disorder, unspecified: Secondary | ICD-10-CM | POA: Diagnosis present

## 2018-08-17 DIAGNOSIS — Z888 Allergy status to other drugs, medicaments and biological substances status: Secondary | ICD-10-CM | POA: Diagnosis not present

## 2018-08-17 DIAGNOSIS — J9621 Acute and chronic respiratory failure with hypoxia: Secondary | ICD-10-CM | POA: Diagnosis present

## 2018-08-17 DIAGNOSIS — J9611 Chronic respiratory failure with hypoxia: Secondary | ICD-10-CM | POA: Diagnosis not present

## 2018-08-17 DIAGNOSIS — Z8249 Family history of ischemic heart disease and other diseases of the circulatory system: Secondary | ICD-10-CM | POA: Diagnosis not present

## 2018-08-17 DIAGNOSIS — Z87891 Personal history of nicotine dependence: Secondary | ICD-10-CM | POA: Diagnosis not present

## 2018-08-17 DIAGNOSIS — R0602 Shortness of breath: Secondary | ICD-10-CM | POA: Diagnosis present

## 2018-08-17 DIAGNOSIS — Z8619 Personal history of other infectious and parasitic diseases: Secondary | ICD-10-CM | POA: Diagnosis not present

## 2018-08-17 DIAGNOSIS — N183 Chronic kidney disease, stage 3 (moderate): Secondary | ICD-10-CM | POA: Diagnosis present

## 2018-08-17 DIAGNOSIS — I5032 Chronic diastolic (congestive) heart failure: Secondary | ICD-10-CM | POA: Diagnosis present

## 2018-08-17 DIAGNOSIS — E1122 Type 2 diabetes mellitus with diabetic chronic kidney disease: Secondary | ICD-10-CM | POA: Diagnosis present

## 2018-08-17 DIAGNOSIS — K219 Gastro-esophageal reflux disease without esophagitis: Secondary | ICD-10-CM | POA: Diagnosis present

## 2018-08-17 DIAGNOSIS — J439 Emphysema, unspecified: Secondary | ICD-10-CM | POA: Diagnosis present

## 2018-08-17 DIAGNOSIS — Z8673 Personal history of transient ischemic attack (TIA), and cerebral infarction without residual deficits: Secondary | ICD-10-CM | POA: Diagnosis not present

## 2018-08-17 DIAGNOSIS — I519 Heart disease, unspecified: Secondary | ICD-10-CM | POA: Diagnosis not present

## 2018-08-17 DIAGNOSIS — E860 Dehydration: Secondary | ICD-10-CM | POA: Diagnosis present

## 2018-08-17 DIAGNOSIS — Z9981 Dependence on supplemental oxygen: Secondary | ICD-10-CM | POA: Diagnosis not present

## 2018-08-17 DIAGNOSIS — Z818 Family history of other mental and behavioral disorders: Secondary | ICD-10-CM | POA: Diagnosis not present

## 2018-08-17 LAB — RESPIRATORY PANEL BY PCR
ADENOVIRUS-RVPPCR: NOT DETECTED
Bordetella pertussis: NOT DETECTED
Chlamydophila pneumoniae: NOT DETECTED
Coronavirus 229E: NOT DETECTED
Coronavirus HKU1: NOT DETECTED
Coronavirus NL63: NOT DETECTED
Coronavirus OC43: NOT DETECTED
Influenza A: NOT DETECTED
Influenza B: NOT DETECTED
Metapneumovirus: NOT DETECTED
Mycoplasma pneumoniae: NOT DETECTED
Parainfluenza Virus 1: NOT DETECTED
Parainfluenza Virus 2: NOT DETECTED
Parainfluenza Virus 3: NOT DETECTED
Parainfluenza Virus 4: NOT DETECTED
RHINOVIRUS / ENTEROVIRUS - RVPPCR: NOT DETECTED
Respiratory Syncytial Virus: NOT DETECTED

## 2018-08-17 LAB — BASIC METABOLIC PANEL
Anion gap: 10 (ref 5–15)
BUN: 29 mg/dL — ABNORMAL HIGH (ref 8–23)
CO2: 27 mmol/L (ref 22–32)
Calcium: 8 mg/dL — ABNORMAL LOW (ref 8.9–10.3)
Chloride: 99 mmol/L (ref 98–111)
Creatinine, Ser: 2.38 mg/dL — ABNORMAL HIGH (ref 0.44–1.00)
GFR calc Af Amer: 24 mL/min — ABNORMAL LOW (ref >60)
GFR calc non Af Amer: 21 mL/min — ABNORMAL LOW (ref >60)
Glucose, Bld: 189 mg/dL — ABNORMAL HIGH (ref 70–99)
Potassium: 3.5 mmol/L (ref 3.5–5.1)
Sodium: 136 mmol/L (ref 135–145)

## 2018-08-17 LAB — EXPECTORATED SPUTUM ASSESSMENT W GRAM STAIN, RFLX TO RESP C

## 2018-08-17 LAB — CBC
HEMATOCRIT: 34.9 % — AB (ref 36.0–46.0)
Hemoglobin: 11.2 g/dL — ABNORMAL LOW (ref 12.0–15.0)
MCH: 34.1 pg — ABNORMAL HIGH (ref 26.0–34.0)
MCHC: 32.1 g/dL (ref 30.0–36.0)
MCV: 106.4 fL — ABNORMAL HIGH (ref 80.0–100.0)
Platelets: 146 10*3/uL — ABNORMAL LOW (ref 150–400)
RBC: 3.28 MIL/uL — ABNORMAL LOW (ref 3.87–5.11)
RDW: 14 % (ref 11.5–15.5)
WBC: 7 10*3/uL (ref 4.0–10.5)
nRBC: 0 % (ref 0.0–0.2)

## 2018-08-17 LAB — EXPECTORATED SPUTUM ASSESSMENT W REFEX TO RESP CULTURE: SPECIAL REQUESTS: NORMAL

## 2018-08-17 LAB — GLUCOSE, CAPILLARY
Glucose-Capillary: 168 mg/dL — ABNORMAL HIGH (ref 70–99)
Glucose-Capillary: 257 mg/dL — ABNORMAL HIGH (ref 70–99)
Glucose-Capillary: 272 mg/dL — ABNORMAL HIGH (ref 70–99)

## 2018-08-17 LAB — INFLUENZA PANEL BY PCR (TYPE A & B)
Influenza A By PCR: NEGATIVE
Influenza B By PCR: NEGATIVE

## 2018-08-17 MED ORDER — INFLUENZA VAC SPLIT HIGH-DOSE 0.5 ML IM SUSY
0.5000 mL | PREFILLED_SYRINGE | INTRAMUSCULAR | Status: AC
  Administered 2018-08-18: 0.5 mL via INTRAMUSCULAR
  Filled 2018-08-17: qty 0.5

## 2018-08-17 MED ORDER — INSULIN STARTER KIT- SYRINGES (ENGLISH)
1.0000 | Freq: Once | Status: DC
  Filled 2018-08-17: qty 1

## 2018-08-17 MED FILL — Insulin Regular (Human) Inj 100 Unit/ML: INTRAMUSCULAR | Qty: 0.1 | Status: AC

## 2018-08-17 NOTE — Discharge Instructions (Signed)
Fingerstick glucose (sugar) goals for home: Before meals: 80-130 mg/dl 2-Hours after meals: less than 180 mg/dl Hemoglobin A1c goal: 7% or less  Symptoms of Hypoglycemia: Silly, Sweaty, Shaky Check sugar if you have your meter.  If near or less than 70 mg/dl, treat with 1/2 cup juice or soda or take glucose tablets Check sugar 15 minutes after treatment.  If sugar still near or less than 70 mg/al and symptomatic, treat again and may need a snack with some protein (peanut butter with crackers, etc)

## 2018-08-17 NOTE — Progress Notes (Signed)
Inpatient Diabetes Program Recommendations  AACE/ADA: New Consensus Statement on Inpatient Glycemic Control (2015)  Target Ranges:  Prepandial:   less than 140 mg/dL      Peak postprandial:   less than 180 mg/dL (1-2 hours)      Critically ill patients:  140 - 180 mg/dL   Results for ROBIN, PETRAKIS (MRN [id number]) as of 08/17/2018 14:14  Ref. Range 08/17/2018 07:42 08/17/2018 12:13  Glucose-Capillary Latest Ref Range: 70 - 99 mg/dL 168 (H) 257 (H)    Current Orders: Lantus 30 units QHS                            Novolog Resistant Correction Scale/ SSI (0-20 units) TID AC + HS     Met with pt again today to discuss new diagnosis of DM.  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  Very familiar with checking CBGs, diabetes, has given heparin shots before, and has also given insulin injections to other family members as well.  Worked with [employer] for years.  Discussed A1C results with patient and explained what an A1C is, basic pathophysiology of DM Type 2, basic home care, basic diabetes diet nutrition principles, importance of checking CBGs and maintaining good CBG control to prevent long-term and short-term complications.  Reviewed signs and symptoms of hyperglycemia and hypoglycemia and how to treat hypoglycemia at home.  Also reviewed blood sugar goals and A1c goals for home.    RNs to provide ongoing basic DM education at bedside with this patient.  Have ordered educational booklet, insulin starter kit, and DM videos.  Have also placed RD consult for DM diet education for this patient.  Patient very open and amenable to all information.  Encouraged pt to make sure she follows up with PCP soon after d/c to discuss new diabetes diagnosis and new diabetes meds.     --Will follow patient during hospitalization--  Wyn Quaker RN, MSN, CDE Diabetes Coordinator Inpatient Glycemic Control Team Team Pager: [phone] (8a-5p)

## 2018-08-17 NOTE — Progress Notes (Addendum)
PROGRESS NOTE  Sue [name]:[id number] DOB: 1951/08/03 DOA: 08/15/2018 PCP: Ann Held, DO  HPI/Recap of past 24 hours: Sue Aurora Hammondis a 67 y.o.femalewithhistory of diastolic CHF, COPD, chronic kidney disease stage III creatinine around 1.5, hypothyroidism, hypertension has been experiencing increasing shortness of breath over the last 1 month with some cough nonproductive. Over the last few days patient has been feeling fatigued weak and increased urination frequency and feeling dry. Due to these symptoms being persistent patient came to the ER at [facility]. Denies chest pain or fever chills vomiting or diarrhea or abdominal pain.  In the ER found to have BG 579 with normal AG.  CXR clear.  Creatinine 2.8 from baseline 1.5. Started on fluids and subcutaneous insulin.  Newly diagnosed type 2 diabetes with hemoglobin A1c 10.5.  Diabetes coordinator has been consulted to assist with insulin education.  08/17/2018: Patient seen and examined at her bedside.  Reports persistent generalized weakness and dizziness when she stands.  Will obtain orthostatics.  Also reports persistent dry cough.  Assessment/Plan: Principal Problem:   AKI (acute kidney injury) ([place]) Active Problems:   Chronic respiratory failure (HCC)   Mild diastolic dysfunction   Hypothyroidism   Pulmonary emphysema (HCC)   Hyperglycemia   ARF (acute renal failure) ([place])  Newly diagnosed type 2 diabetes with hyperglycemia Presented with glucosuria, chemistry blood sugar greater than 500, hemoglobin A1c of 10.5 Currently on Lantus 30 units daily and insulin sliding scale Diabetes coordinator following for insulin education Will need to be discharged on insulin, glucometer kit/needles/strips Obtain lipid panel Heart healthy carb modified diet  AKI on CKD 3 Suspect prerenal Presented with creatinine of 2.80 and GFR of 20 Baseline creatinine is 1.5 with GFR of 41 Avoid nephrotoxic  agents/dehydration/hypotension Creatinine is improving from 2.6122 point 3/8 Monitor urine output Continue to monitor renal function repeat BMP in the morning  Chronic respiratory failure on 3 L of oxygen by nasal cannula continuously at baseline Maintain O2 saturation greater than 90% Continue COPD home medications  COPD/chronic bronchitis Resume COPD medications Start breathing treatment around-the-clock duo nebs every 6 hours Start pulmonary toilet with 1200 mg Mucinex twice daily and hypersaline nebs 3 times daily  Generalized weakness/physical debility Also reports persistent cough Influenza a and B PCR Respiratory viral panel PT to assess  Fall precautions Obtain orthostatic vital signs  History of hepatitis C, treated    Morbid obesity Recommend weight loss outpatient  Chronic diastolic CHF Not in exacerbation Last 2D echo done on 04/10/2018 revealed preserved LVEF with grade 1 diastolic dysfunction Resume cardiac medications Hold torsemide due to dehydration and AKI  Risks: High risk for decompensation due to newly diagnosed type 2 diabetes with hemoglobin A1c greater than 10 requiring insulin for diabetes management, AKI on CKD 3, multiple comorbidities and advanced age.  Patient will require at least 2 midnights for further evaluation and treatment of present condition.   DVT prophylaxis:  Subcu Lovenox daily Code Status: FULL Family Communication:  No family members at bedside     Objective: Vitals:   08/16/18 2006 08/17/18 0434 08/17/18 0744 08/17/18 1133  BP: 101/66 (!) 99/58 118/85   Pulse: 88 60 88   Resp: (!) 22 (!) 24 18   Temp: 98.5 F (36.9 C) 98.3 F (36.8 C) 98.3 F (36.8 C)   TempSrc:  Oral Oral   SpO2: 97% 98% 99% 96%  Weight:      Height:  Intake/Output Summary (Last 24 hours) at 08/17/2018 1401 Last data filed at 08/17/2018 0331 Gross per 24 hour  Intake 687.95 ml  Output -  Net 687.95 ml   Filed Weights   08/15/18 1829    Weight: 131.5 kg    Exam:  . General: 67 y.o. year-old female well developed well nourished in no acute distress.  Alert and oriented x3. . Cardiovascular: Regular rate and rhythm with no rubs or gallops.  No thyromegaly or JVD noted.   Marland Kitchen Respiratory: Clear to auscultation with no wheezes or rales. Good inspiratory effort. . Abdomen: Soft nontender nondistended with normal bowel sounds x4 quadrants. . Musculoskeletal: Trace lower extremity edema. 2/4 pulses in all 4 extremities. . Skin: No ulcerative lesions noted or rashes . Psychiatry: Mood is appropriate for condition and setting   Data Reviewed: CBC: Recent Labs  Lab 08/15/18 2049 08/16/18 0257 08/17/18 0743  WBC 6.2 6.7 7.0  NEUTROABS 3.5 3.2  --   HGB 11.6* 11.7* 11.2*  HCT 36.2 36.5 34.9*  MCV 102.8* 103.7* 106.4*  PLT 154 154 161*   Basic Metabolic Panel: Recent Labs  Lab 08/15/18 2049 08/16/18 0612 08/16/18 1621 08/17/18 0743  NA 127* 131* 134* 136  K 4.1 3.6 3.4* 3.5  CL 90* 93* 98 99  CO2 '28 28 26 27  ' GLUCOSE 579* 540* 366* 189*  BUN 24* 25* 27* 29*  CREATININE 2.80* 2.64* 2.61* 2.38*  CALCIUM 8.5* 8.3* 8.3* 8.0*  MG  --  2.0  --   --    GFR: Estimated Creatinine Clearance: 30.8 mL/min (A) (by C-G formula based on SCr of 2.38 mg/dL (H)). Liver Function Tests: Recent Labs  Lab 08/16/18 0612  AST 19  ALT 15  ALKPHOS 115  BILITOT 0.5  PROT 8.3*  ALBUMIN 3.4*   No results for input(s): LIPASE, AMYLASE in the last 168 hours. No results for input(s): AMMONIA in the last 168 hours. Coagulation Profile: No results for input(s): INR, PROTIME in the last 168 hours. Cardiac Enzymes: Recent Labs  Lab 08/15/18 2049 08/16/18 0612  TROPONINI <0.03 <0.03   BNP (last 3 results) Recent Labs    01/17/18 1616  PROBNP 42   HbA1C: Recent Labs    08/15/18 2049 08/16/18 0612  HGBA1C 10.5* 10.6*   CBG: Recent Labs  Lab 08/16/18 1905 08/16/18 2004 08/16/18 2103 08/17/18 0742 08/17/18 1213   GLUCAP 185* 149* 144* 168* 257*   Lipid Profile: No results for input(s): CHOL, HDL, LDLCALC, TRIG, CHOLHDL, LDLDIRECT in the last 72 hours. Thyroid Function Tests: No results for input(s): TSH, T4TOTAL, FREET4, T3FREE, THYROIDAB in the last 72 hours. Anemia Panel: No results for input(s): VITAMINB12, FOLATE, FERRITIN, TIBC, IRON, RETICCTPCT in the last 72 hours. Urine analysis:    Component Value Date/Time   COLORURINE YELLOW 08/15/2018 2201   APPEARANCEUR CLEAR 08/15/2018 2201   LABSPEC <1.005 (L) 08/15/2018 2201   PHURINE 5.5 08/15/2018 2201   GLUCOSEU >=500 (A) 08/15/2018 2201   GLUCOSEU NEGATIVE 11/11/2013 1353   HGBUR NEGATIVE 08/15/2018 2201   BILIRUBINUR NEGATIVE 08/15/2018 2201   BILIRUBINUR neg 10/27/2015 1355   KETONESUR NEGATIVE 08/15/2018 2201   PROTEINUR NEGATIVE 08/15/2018 2201   UROBILINOGEN 0.2 10/27/2015 1355   UROBILINOGEN 0.2 07/29/2014 2300   NITRITE NEGATIVE 08/15/2018 2201   LEUKOCYTESUR NEGATIVE 08/15/2018 2201   Sepsis Labs: '@LABRCNTIP' (procalcitonin:4,lacticidven:4)  )No results found for this or any previous visit (from the past 240 hour(s)).    Studies: No results found.  Scheduled Meds: .  aspirin EC  81 mg Oral Daily  . busPIRone  15 mg Oral BID  . famotidine  20 mg Oral Daily  . insulin aspart  0-20 Units Subcutaneous TID WC  . insulin aspart  0-5 Units Subcutaneous QHS  . insulin glargine  30 Units Subcutaneous QHS  . levothyroxine  50 mcg Oral Q0600  . LORazepam  1 mg Oral QHS  . metoprolol succinate  100 mg Oral Daily  . zolpidem  5 mg Oral QHS    Continuous Infusions: . sodium chloride 50 mL/hr at 08/17/18 1028     LOS: 0 days     Kayleen Memos, MD [facility] Pager [phone]  If 7PM-7AM, please contact night-coverage [email or web address] Password [email or web address] 08/17/2018, 2:01 PM

## 2018-08-18 DIAGNOSIS — J9611 Chronic respiratory failure with hypoxia: Secondary | ICD-10-CM

## 2018-08-18 DIAGNOSIS — J439 Emphysema, unspecified: Secondary | ICD-10-CM

## 2018-08-18 DIAGNOSIS — E039 Hypothyroidism, unspecified: Secondary | ICD-10-CM

## 2018-08-18 DIAGNOSIS — Z23 Encounter for immunization: Secondary | ICD-10-CM | POA: Diagnosis not present

## 2018-08-18 DIAGNOSIS — I519 Heart disease, unspecified: Secondary | ICD-10-CM

## 2018-08-18 LAB — CBC
HCT: 32.6 % — ABNORMAL LOW (ref 36.0–46.0)
Hemoglobin: 10.5 g/dL — ABNORMAL LOW (ref 12.0–15.0)
MCH: 34 pg (ref 26.0–34.0)
MCHC: 32.2 g/dL (ref 30.0–36.0)
MCV: 105.5 fL — AB (ref 80.0–100.0)
Platelets: 132 10*3/uL — ABNORMAL LOW (ref 150–400)
RBC: 3.09 MIL/uL — ABNORMAL LOW (ref 3.87–5.11)
RDW: 14.1 % (ref 11.5–15.5)
WBC: 7.2 10*3/uL (ref 4.0–10.5)
nRBC: 0 % (ref 0.0–0.2)

## 2018-08-18 LAB — BASIC METABOLIC PANEL
Anion gap: 9 (ref 5–15)
BUN: 28 mg/dL — ABNORMAL HIGH (ref 8–23)
CO2: 26 mmol/L (ref 22–32)
Calcium: 7.8 mg/dL — ABNORMAL LOW (ref 8.9–10.3)
Chloride: 103 mmol/L (ref 98–111)
Creatinine, Ser: 1.88 mg/dL — ABNORMAL HIGH (ref 0.44–1.00)
GFR calc Af Amer: 32 mL/min — ABNORMAL LOW (ref >60)
GFR, EST NON AFRICAN AMERICAN: 27 mL/min — AB (ref >60)
Glucose, Bld: 162 mg/dL — ABNORMAL HIGH (ref 70–99)
POTASSIUM: 3.4 mmol/L — AB (ref 3.5–5.1)
Sodium: 138 mmol/L (ref 135–145)

## 2018-08-18 MED ORDER — METFORMIN HCL 500 MG PO TABS: 1000.0000 mg | ORAL_TABLET | Freq: Two times a day (BID) | ORAL | 11 refills | Status: DC

## 2018-08-18 MED ORDER — INSULIN GLARGINE 100 UNIT/ML ~~LOC~~ SOLN: 30.0000 [IU] | Freq: Every day | SUBCUTANEOUS | 11 refills | Status: DC

## 2018-08-18 MED ORDER — INSULIN STARTER KIT- SYRINGES (ENGLISH): 1.0000 | Freq: Once | 0 refills | Status: AC

## 2018-08-18 MED ORDER — FLUTICASONE PROPIONATE HFA 110 MCG/ACT IN AERO: 2.0000 | INHALATION_SPRAY | Freq: Two times a day (BID) | RESPIRATORY_TRACT | 0 refills | Status: DC

## 2018-08-18 MED ORDER — GLIPIZIDE 5 MG PO TABS: 2.5000 mg | ORAL_TABLET | Freq: Two times a day (BID) | ORAL | 11 refills | Status: DC

## 2018-08-18 MED ORDER — BLOOD GLUCOSE MONITOR KIT: PACK | 0 refills | Status: DC

## 2018-08-18 NOTE — Progress Notes (Signed)
Discharge instructions and medications discussed with patient.  AVS and prescriptions given to patient.  Patient stated she is comfortable administering insulin.  All questions answered.

## 2018-08-18 NOTE — Care Management Note (Addendum)
Case Management Note  Patient Details  Name: Sue [name] MRN: [id number] Date of Birth: 05-19-1951  Subjective/Objective:     AKI, DM II,                Action/Plan: Spoke to pt and dtr, Rosalene Billings at bedside. Offered choice for HH/CMS list provide/placed on chart. Pt has oxygen ([facility]), neb machine and shower chair at home. Dtr requested [facility] for [facility]. Contacted [facility] for Rollator and 3n1 bedside commode for home to be delivered to room prior to dc. And referral for [facility]. [facility] will deliver DME to the home. Made dtr aware.   Expected Discharge Date:  08/18/18               Expected Discharge Plan:  [place]  In-House Referral:  NA  Discharge planning Services  CM Consult  Post Acute Care Choice:  Home Health Choice offered to:  Patient  DME Arranged:  3-N-1, Walker rolling with seat DME Agency:  [place]:  RN, Nurse's Aide [place] Agency:  [facility]  Status of Service:  Completed, signed off  If discussed at [place] of Stay Meetings, dates discussed:    Additional Comments:  Erenest Rasher, RN 08/18/2018, 11:56 AM

## 2018-08-18 NOTE — Progress Notes (Signed)
Patient self administered 3 units Novo log while this RN observed.

## 2018-08-18 NOTE — Evaluation (Addendum)
Physical Therapy Evaluation Patient Details Name: Sue [name] MRN: [id number] DOB: October 14, 1950 Today's Date: 08/18/2018   History of Present Illness  67 y.o. female with history of diastolic CHF, COPD, chronic kidney disease stage III creatinine around 1.5, hypothyroidism, hypertension has been experiencing increasing shortness of breath over the last 1 month with some cough nonproductive.  Over the last few days patient has been feeling fatigued weak and increased urination frequency and feeling dry. Dx of hyperglycemia, AKI, dehydration  Clinical Impression  Pt ambulated 75' with RW and 3L O2, no loss of balance. At baseline, pt doesn't use an assistive device, but reports she holds onto walls/furniture. Encouraged pt to use RW at home for increased support. HHPT and rollator recommended. Pt is ready to DC home from PT standpoint.     Follow Up Recommendations Home health PT    Equipment Recommendations  Rolling walker with 5" wheels (4 wheeled rollator); 3 in 1    Recommendations for Other Services       Precautions / Restrictions Precautions Precautions: Fall Precaution Comments: pt denies falls in past 1 year Restrictions Weight Bearing Restrictions: No      Mobility  Bed Mobility               General bed mobility comments: up in recliner  Transfers Overall transfer level: Needs assistance Equipment used: Rolling walker (2 wheeled) Transfers: Sit to/from Stand Sit to Stand: Modified independent (Device/Increase time)            Ambulation/Gait Ambulation/Gait assistance: Supervision Gait Distance (Feet): 90 Feet Assistive device: Rolling walker (2 wheeled) Gait Pattern/deviations: Step-through pattern Gait velocity: WFL   General Gait Details: distance limited by fatigue, no loss of balance, ambulated with 3L O2 [name], pt reports she ambulates longer distances at baseline but feels weak 2* illness  Stairs            Wheelchair Mobility     Modified Rankin (Stroke Patients Only)       Balance Overall balance assessment: Modified Independent                                           Pertinent Vitals/Pain Pain Assessment: No/denies pain    Home Living Family/patient expects to be discharged to:: Private residence Living Arrangements: Alone Available Help at Discharge: Family;Available PRN/intermittently   Home Access: Stairs to enter   Entrance Stairs-Number of Steps: 1 Home Layout: One level Home Equipment: Shower seat      Prior Function Level of Independence: Needs assistance   Gait / Transfers Assistance Needed: holds onto furniture at home  ADL's / Homemaking Assistance Needed: daughter supervises bathing  Comments: furniture walks short distances 2* dyspnea, on 3L home O2, daughter checks in daily and stands by for shower transfers     Hand Dominance        Extremity/Trunk Assessment   Upper Extremity Assessment Upper Extremity Assessment: Overall WFL for tasks assessed    Lower Extremity Assessment Lower Extremity Assessment: Overall WFL for tasks assessed    Cervical / Trunk Assessment Cervical / Trunk Assessment: Normal  Communication   Communication: No difficulties  Cognition Arousal/Alertness: Awake/alert Behavior During Therapy: WFL for tasks assessed/performed Overall Cognitive Status: Within Functional Limits for tasks assessed  General Comments      Exercises     Assessment/Plan    PT Assessment All further PT needs can be met in the next venue of care  PT Problem List Decreased activity tolerance       PT Treatment Interventions      PT Goals (Current goals can be found in the Care Plan section)  Acute Rehab PT Goals Patient Stated Goal: be able to walk farther PT Goal Formulation: All assessment and education complete, DC therapy    Frequency     Barriers to discharge         Co-evaluation               AM-PAC PT "6 Clicks" Mobility  Outcome Measure Help needed turning from your back to your side while in a flat bed without using bedrails?: A Little Help needed moving from lying on your back to sitting on the side of a flat bed without using bedrails?: A Little Help needed moving to and from a bed to a chair (including a wheelchair)?: A Little Help needed standing up from a chair using your arms (e.g., wheelchair or bedside chair)?: None Help needed to walk in hospital room?: None Help needed climbing 3-5 steps with a railing? : A Little 6 Click Score: 20    End of Session Equipment Utilized During Treatment: Gait belt;Oxygen Activity Tolerance: Patient tolerated treatment well Patient left: in chair;with call bell/phone within reach;with family/visitor present Nurse Communication: Mobility status      Time: 4034-7425 PT Time Calculation (min) (ACUTE ONLY): 15 min   Charges:   PT Evaluation $PT Eval Low Complexity: 1 Low          Philomena Doheny PT 08/18/2018  Acute Rehabilitation Services Pager [phone] Office [phone]

## 2018-08-18 NOTE — Discharge Summary (Signed)
Physician Discharge Summary  Sue [name] XFG:[id number] DOB: 1951-06-19 DOA: 08/15/2018  PCP: Sue Held, DO  Admit date: 08/15/2018 Discharge date: 08/18/2018  Admitted From: Home  Disposition:  Home with home heatlh   Recommendations for Outpatient Follow-up:  1. Follow up with PCP in 1-2 weeks 2. Please obtain BMP/CBC in one week 3. Please discontinue Seroquel due to DKA and transition to alternative agent 4. Please titrate metformin/Lantus based on blood sugar log; consider SGLT2 inhibitor for this patient with CHF     Home Health: Yes  Equipment/Devices: Rollator, 3-in-1  Discharge Condition: Good  CODE STATUS: FULL Diet recommendation: Diabetic, cardiac  Brief/Interim Summary: Sue [name] is a 67 y.o. F with dCHF, COPD on 3L home O2 at baseline, CKD III, baseline Cr 1.5, hypothyroidism, morbid obesity and HTN who has had several weeks progressive SOB and increased non-productive cough.  She had also noticed progressive polyuria, polydipsia.     Over last few days prior to admission, patient noticed new fatigue, generalized weakness, feeling "dehydrated" and thirsty.  Presented to ER where she was found to have BG 576 mg/dL, normal AG, and creatinine up to 2.8 mg/dL from baseline 1.5.         PRINCIPAL HOSPITAL DIAGNOSIS: Acute kidney injury from dehydration from new onset diabetes    Discharge Diagnoses:   New onset type 2 diabetes In setting of morbid obesity, COPD/chronic respiratory failure with prednisone 2-3 times per year per report, and Seroquel.  Seroquel stopped.  Started on insulin infusion and sugars well-controlled.  Transitioned to subcutaneous Lantus and blood sugar reasonably well controlled.   -Discharged with Lantus, metformin and glipizide.  -If insurance coverage reasonable, an SGLT-2 inhibitor would be preferred over glipizde in this patient with CHF and no hx recurrent UTI.    Acute kidney injury on chronic kidney disease  stage III Baseline Cr 1.5, nearly doubled to 2.8 on admission.  Given IV fluids but slow recovery of creatinine.  Down to 1.8 on day of dsicharge.  Home ARB [name] at discharge, resumed torsemide.   -Repeat BMP in 1 week  COPD flare Mild.  Patient tested negative on influenza and RVP.  Given Flovent for 2 weeks on discharge. Avoid systemic steroids.  Hypertensive urgency Hypertension Chronic diastolic CHF Initial BP >967/893.  Controlled by restarting home medications.  Hypothyroidism  Bipolar disorder  Morbid obesity  History Hep C, treated       Discharge Instructions  Discharge Instructions    Ambulatory referral to Nutrition and Diabetic Education   Complete by:  As directed    New Diagnosis of DM.  A1c 10.6%.  Likely to d/c home on Insulin.  PCP: Sue [name].   Diet Carb Modified   Complete by:  As directed    Discharge instructions   Complete by:  As directed    From Sue [name]: You were admitted with fatigue and found to have dehydration that caused a small kidney injury. It turned out that your dehydration was from severe high blood sugars from new diabetes!  You were treated with IV fluids and your kidney function returned to normal. For now, STOP your lisinopril and don't take it until you see Sue [name]. Call Sue [name] office for a follow up appointment in the next week and have them check your labs   For the new diabetes: You were started on insulin and we were able to control your sugars. You will go home with three new medicines for diabetes:  1. Take Lantus (a long-acting insulin) 30 units each night before breakfast 2. Take glipizide 2.5 mg (half tab) twice daily before breakfast and dinner 3. Take metformin   For the metformin, start low and go slow to taper up to the dose listed on the bottle (don't start at the dose listed on the bottle, you will get diarrhea and nausea).   Start with 500 mg (one tab) once daily in the morning for a  week. Then increase to 500 mg twice daily for a week Then increase to 1000 mg (two tabs) in the morning and 500 mg (one tab) at night for a week Ultimately, you should be taking 1000 mg (two tabs) twice daily   As we talked about, Seroquel can cause your sugars to be too high, so stop this medicine and ask your doctor for an alternative.  Check your sugar every morning and call Sue [name] office 24/7 if you have sugars less than 70 mg/dL in the morning, or more than 300 mg/dL in the morning.  Also, check your sugar any time you have symptoms of low blood sugar (symptoms like feeling shaky, weak, nauseated, sweaty, or confused).  If your sugar is less than 70, drink something sweet like orange juice or eat a piece of candy and recheck your sugar in 20 minutes.  AND call Sue [name] office for instructions on if you should continue your medicines.     We also noticed you were having a COPD flare: Use your albuterol as needed up to every 4 hours at home Use your home oxygen Get the Flovent inhaler (this is an inhaled steroid), and take twice daily for the next 2 weeks or until your breathing feels back to normal Remember to [name] after you use the Flovent inhaler     In summary, new medicines for diabetes: Lantus insulin, metformin, glipizide  New medicine temporarily for COPD flare: Flovent  Continue these medicines: Metoprolol, baby aspirin, torsemide, levothyroxine (thyroid pill), and Buspar  STOP these medicines: Seroquel  HOLD these medicines (do not take until you see Sue [name] and she tells you to restart): lisinopril   Increase activity slowly   Complete by:  As directed      Allergies as of 08/18/2018      Reactions   Norvasc [amlodipine Besylate]    Marked swelling      Medication List    STOP taking these medications   lisinopril 20 MG tablet Commonly known as:  PRINIVIL,ZESTRIL   QUEtiapine 50 MG tablet Commonly known as:  SEROQUEL      TAKE these medications   albuterol 108 (90 Base) MCG/ACT inhaler Commonly known as:  PROVENTIL HFA;VENTOLIN HFA Inhale 1-2 puffs into the lungs every 6 (six) hours as needed for wheezing or shortness of breath.   albuterol (2.5 MG/3ML) 0.083% nebulizer solution Commonly known as:  PROVENTIL Take 3 mLs (2.5 mg total) by nebulization every 6 (six) hours as needed for wheezing or shortness of breath.   aspirin 81 MG EC tablet Take 1 tablet (81 mg total) by mouth daily.   ATIVAN 2 MG tablet Generic drug:  LORazepam Take 0.5-1 mg by mouth as directed. 0.5 Mg in the afternoon and 1 MG at bedtime   blood glucose meter kit and supplies Kit Dispense based on patient and insurance preference. Use up to four times daily as directed. (FOR ICD-9 250.00, 250.01).   busPIRone 15 MG tablet Commonly known as:  BUSPAR Take 22  mg by mouth daily.   fluticasone 110 MCG/ACT inhaler Commonly known as:  FLOVENT HFA Inhale 2 puffs into the lungs 2 (two) times daily.   fluticasone 50 MCG/ACT nasal spray Commonly known as:  FLONASE Place 2 sprays into both nostrils daily as needed for allergies.   glipiZIDE 5 MG tablet Commonly known as:  GLUCOTROL Take 0.5 tablets (2.5 mg total) by mouth 2 (two) times daily before a meal.   insulin glargine 100 UNIT/ML injection Commonly known as:  LANTUS Inject 0.3 mLs (30 Units total) into the skin at bedtime.   insulin starter kit- syringes Misc 1 kit by Other route once for 1 dose.   levothyroxine 50 MCG tablet Commonly known as:  SYNTHROID, LEVOTHROID TAKE ONE TABLET BY MOUTH ONCE DAILY   metFORMIN 500 MG tablet Commonly known as:  GLUCOPHAGE Take 2 tablets (1,000 mg total) by mouth 2 (two) times daily with a meal.   metoprolol succinate 50 MG 24 hr tablet Commonly known as:  TOPROL-XL TAKE 2 TABLETS BY MOUTH ONCE DAILY IN THE MORNING WITH A MEAL OR IMMEDIATELY FOLLOWING A MEAL What changed:  See the new instructions.   OXYGEN Inhale 3 L into the  lungs continuous.   potassium chloride SA 20 MEQ tablet Commonly known as:  K-DUR,KLOR-CON TAKE 1 TABLET BY MOUTH ONCE DAILY *NEED  OFFICE  VISIT* What changed:  See the new instructions.   ranitidine 150 MG tablet Commonly known as:  ZANTAC TAKE 1 TABLET BY MOUTH ONCE DAILY AT BEDTIME   torsemide 20 MG tablet Commonly known as:  DEMADEX Take 1 tablet (20 mg total) by mouth daily.   zolpidem 5 MG tablet Commonly known as:  AMBIEN Take 2 tablets (10 mg total) by mouth at bedtime.      Follow-up Information    [facility] Follow up.   Specialty:  Home Health Services Why:  Home Health RN, Physical Therapy, Occupational Therapy and aide-agency will call to arrange initial visit Contact information: [place] [id number] [phone]          Allergies  Allergen Reactions  . Norvasc [Amlodipine Besylate]     Marked swelling    Consultations:  Diabetes educator   Procedures/Studies: Dg Chest 2 View  Result Date: 08/15/2018 CLINICAL DATA:  Dyspnea times 3-4 weeks. EXAM: CHEST - 2 VIEW COMPARISON:  10/19/2017 FINDINGS: The heart size and mediastinal contours are within normal limits. Chronic diffuse mild bilateral interstitial pulmonary changes are stable. No alveolar consolidation, effusion or pneumothorax. The visualized skeletal structures are unremarkable. IMPRESSION: Stable interstitial prominence of the lungs, nonspecific but may reflect chronic interstitial lung disease. Electronically Signed   By: Ashley Royalty M.D.   On: 08/15/2018 20:02       Subjective: Feeling better.  Still somewhat fatigued, but better.  No change to cough, productive of sputum.  No confusion, fever.  Mild leg sewlling noted.  Discharge Exam: Vitals:   08/17/18 2024 08/18/18 0423  BP: (!) 103/55 133/73  Pulse: 88 87  Resp: 20 (!) 23  Temp: 98 F (36.7 C) 98.2 F (36.8 C)  SpO2: 100% 98%   Vitals:   08/17/18 1133 08/17/18 2024 08/18/18  0423 08/18/18 0500  BP:  (!) 103/55 133/73   Pulse:  88 87   Resp:  20 (!) 23   Temp:  98 F (36.7 C) 98.2 F (36.8 C)   TempSrc:  Oral Oral   SpO2: 96% 100% 98%   Weight:    Marland Kitchen)  142.6 kg  Height:        General: Pt is alert, awake, not in acute distress Cardiovascular: RRR, nl S1-S2, no murmurs appreciated.   Mild bilateral LE edema.   Respiratory: Normal respiratory rate and rhythm.  Few rare wheezes bilaterally Abdominal: Abdomen soft and non-tender.  No distension or HSM.   Neuro/Psych: Strength symmetric in upper and lower extremities.  Judgment and insight appear normal.   The results of significant diagnostics from this hospitalization (including imaging, microbiology, ancillary and laboratory) are listed below for reference.     Microbiology: Recent Results (from the past 240 hour(s))  Respiratory Panel by PCR     Status: None   Collection Time: 08/17/18  2:27 PM  Result Value Ref Range Status   Adenovirus NOT DETECTED NOT DETECTED Final   Coronavirus 229E NOT DETECTED NOT DETECTED Final   Coronavirus HKU1 NOT DETECTED NOT DETECTED Final   Coronavirus NL63 NOT DETECTED NOT DETECTED Final   Coronavirus OC43 NOT DETECTED NOT DETECTED Final   Metapneumovirus NOT DETECTED NOT DETECTED Final   Rhinovirus / Enterovirus NOT DETECTED NOT DETECTED Final   Influenza A NOT DETECTED NOT DETECTED Final   Influenza B NOT DETECTED NOT DETECTED Final   Parainfluenza Virus 1 NOT DETECTED NOT DETECTED Final   Parainfluenza Virus 2 NOT DETECTED NOT DETECTED Final   Parainfluenza Virus 3 NOT DETECTED NOT DETECTED Final   Parainfluenza Virus 4 NOT DETECTED NOT DETECTED Final   Respiratory Syncytial Virus NOT DETECTED NOT DETECTED Final   Bordetella pertussis NOT DETECTED NOT DETECTED Final   Chlamydophila pneumoniae NOT DETECTED NOT DETECTED Final   Mycoplasma pneumoniae NOT DETECTED NOT DETECTED Final  Expectorated sputum assessment w rflx to resp cult     Status: None   Collection  Time: 08/17/18  2:27 PM  Result Value Ref Range Status   Specimen Description SPU EXPECTORATED  Final   Special Requests Normal  Final   Sputum evaluation   Final    THIS SPECIMEN IS ACCEPTABLE FOR SPUTUM CULTURE Performed at [facility], [place] [address] [place], [place] [address]    Report Status 08/17/2018 FINAL  Final  Culture, respiratory     Status: None (Preliminary result)   Collection Time: 08/17/18  2:27 PM  Result Value Ref Range Status   Specimen Description   Final    SPU EXPECTORATED Performed at [facility] [address] [place], [place] [address]    Special Requests   Final    Normal Reflexed from [phone] [id number] Performed at [facility], [place] [address], [place], [place] [address]    Gram Stain   Final    FEW WBC PRESENT,BOTH PMN AND MONONUCLEAR MODERATE GRAM POSITIVE COCCI    Culture   Final    FEW Consistent with normal respiratory flora. Performed at [facility], [place] [address] [place], [place] [address]    Report Status PENDING  Incomplete     Labs: BNP (last 3 results) No results for input(s): BNP in the last 8760 hours. Basic Metabolic Panel: Recent Labs  Lab 08/15/18 2049 08/16/18 0612 08/16/18 1621 08/17/18 0743 08/18/18 0556  NA 127* 131* 134* 136 138  K 4.1 3.6 3.4* 3.5 3.4*  CL 90* 93* 98 99 103  CO2 '28 28 26 27 26  ' GLUCOSE 579* 540* 366* 189* 162*  BUN 24* 25* 27* 29* 28*  CREATININE 2.80* 2.64* 2.61* 2.38* 1.88*  CALCIUM 8.5* 8.3* 8.3* 8.0* 7.8*  MG  --  2.0  --   --   --    Liver Function Tests: Recent Labs  Lab 08/16/18 0612  AST 19  ALT 15  ALKPHOS 115  BILITOT 0.5  PROT 8.3*  ALBUMIN 3.4*   No results for input(s): LIPASE, AMYLASE in the last 168 hours. No results for input(s): AMMONIA in the last 168 hours. CBC: Recent Labs  Lab 08/15/18 2049 08/16/18 0257 08/17/18 0743 08/18/18 0556  WBC 6.2 6.7 7.0 7.2  NEUTROABS 3.5 3.2  --   --   HGB 11.6* 11.7*  11.2* 10.5*  HCT 36.2 36.5 34.9* 32.6*  MCV 102.8* 103.7* 106.4* 105.5*  PLT 154 154 146* 132*   Cardiac Enzymes: Recent Labs  Lab 08/15/18 2049 08/16/18 0612  TROPONINI <0.03 <0.03   BNP: Invalid input(s): POCBNP CBG: Recent Labs  Lab 08/16/18 2004 08/16/18 2103 08/17/18 0742 08/17/18 1213 08/17/18 1655  GLUCAP 149* 144* 168* 257* 272*   D-Dimer Recent Labs    08/16/18 0612  DDIMER 1.23*   Hgb A1c Recent Labs    08/15/18 2049 08/16/18 0612  HGBA1C 10.5* 10.6*   Lipid Profile No results for input(s): CHOL, HDL, LDLCALC, TRIG, CHOLHDL, LDLDIRECT in the last 72 hours. Thyroid function studies No results for input(s): TSH, T4TOTAL, T3FREE, THYROIDAB in the last 72 hours.  Invalid input(s): FREET3 Anemia work up No results for input(s): VITAMINB12, FOLATE, FERRITIN, TIBC, IRON, RETICCTPCT in the last 72 hours. Urinalysis    Component Value Date/Time   COLORURINE YELLOW 08/15/2018 2201   APPEARANCEUR CLEAR 08/15/2018 2201   LABSPEC <1.005 (L) 08/15/2018 2201   PHURINE 5.5 08/15/2018 2201   GLUCOSEU >=500 (A) 08/15/2018 2201   GLUCOSEU NEGATIVE 11/11/2013 1353   HGBUR NEGATIVE 08/15/2018 2201   BILIRUBINUR NEGATIVE 08/15/2018 2201   BILIRUBINUR neg 10/27/2015 1355   KETONESUR NEGATIVE 08/15/2018 2201   PROTEINUR NEGATIVE 08/15/2018 2201   UROBILINOGEN 0.2 10/27/2015 1355   UROBILINOGEN 0.2 07/29/2014 2300   NITRITE NEGATIVE 08/15/2018 2201   LEUKOCYTESUR NEGATIVE 08/15/2018 2201   Sepsis Labs Invalid input(s): PROCALCITONIN,  WBC,  LACTICIDVEN Microbiology Recent Results (from the past 240 hour(s))  Respiratory Panel by PCR     Status: None   Collection Time: 08/17/18  2:27 PM  Result Value Ref Range Status   Adenovirus NOT DETECTED NOT DETECTED Final   Coronavirus 229E NOT DETECTED NOT DETECTED Final   Coronavirus HKU1 NOT DETECTED NOT DETECTED Final   Coronavirus NL63 NOT DETECTED NOT DETECTED Final   Coronavirus OC43 NOT DETECTED NOT DETECTED  Final   Metapneumovirus NOT DETECTED NOT DETECTED Final   Rhinovirus / Enterovirus NOT DETECTED NOT DETECTED Final   Influenza A NOT DETECTED NOT DETECTED Final   Influenza B NOT DETECTED NOT DETECTED Final   Parainfluenza Virus 1 NOT DETECTED NOT DETECTED Final   Parainfluenza Virus 2 NOT DETECTED NOT DETECTED Final   Parainfluenza Virus 3 NOT DETECTED NOT DETECTED Final   Parainfluenza Virus 4 NOT DETECTED NOT DETECTED Final   Respiratory Syncytial Virus NOT DETECTED NOT DETECTED Final   Bordetella pertussis NOT DETECTED NOT DETECTED Final   Chlamydophila pneumoniae NOT DETECTED NOT DETECTED Final   Mycoplasma pneumoniae NOT DETECTED NOT DETECTED Final  Expectorated sputum assessment w rflx to resp cult     Status: None   Collection Time: 08/17/18  2:27 PM  Result Value Ref Range Status   Specimen Description SPU EXPECTORATED  Final   Special Requests Normal  Final   Sputum evaluation  Final    THIS SPECIMEN IS ACCEPTABLE FOR SPUTUM CULTURE Performed at [facility] [address], [place], [place] [address]    Report Status 08/17/2018 FINAL  Final  Culture, respiratory     Status: None (Preliminary result)   Collection Time: 08/17/18  2:27 PM  Result Value Ref Range Status   Specimen Description   Final    SPU EXPECTORATED Performed at [facility] [address] [place], [place] [address]    Special Requests   Final    Normal Reflexed from [phone] Performed at [facility], [place] [address], [place], [place] [address]    Gram Stain   Final    FEW WBC PRESENT,BOTH PMN AND MONONUCLEAR MODERATE GRAM POSITIVE COCCI    Culture   Final    FEW Consistent with normal respiratory flora. Performed at [facility], [place] [address] [place], [place] [address]    Report Status PENDING  Incomplete     Time coordinating discharge: 40 minutes       SIGNED:   Edwin Dada, MD  [facility] 08/18/2018, 6:46 PM

## 2018-08-19 DIAGNOSIS — J9601 Acute respiratory failure with hypoxia: Secondary | ICD-10-CM | POA: Diagnosis not present

## 2018-08-19 DIAGNOSIS — J449 Chronic obstructive pulmonary disease, unspecified: Secondary | ICD-10-CM | POA: Diagnosis not present

## 2018-08-19 DIAGNOSIS — J209 Acute bronchitis, unspecified: Secondary | ICD-10-CM | POA: Diagnosis not present

## 2018-08-19 DIAGNOSIS — J439 Emphysema, unspecified: Secondary | ICD-10-CM | POA: Diagnosis not present

## 2018-08-19 LAB — GLUCOSE, CAPILLARY
GLUCOSE-CAPILLARY: 221 mg/dL — AB (ref 70–99)
Glucose-Capillary: 193 mg/dL — ABNORMAL HIGH (ref 70–99)
Glucose-Capillary: 147 mg/dL — ABNORMAL HIGH (ref 70–99)

## 2018-08-20 ENCOUNTER — Telehealth: Payer: Self-pay

## 2018-08-20 LAB — CULTURE, RESPIRATORY W GRAM STAIN: Culture: NORMAL

## 2018-08-20 LAB — CULTURE, RESPIRATORY: SPECIAL REQUESTS: NORMAL

## 2018-08-20 NOTE — Telephone Encounter (Signed)
08/20/18  Transition Care Management Follow-up Telephone Call  ADMISSION DATE: 08/12/18  DISCHARGE DATE: 08/15/18   How have you been since you were released from the hospital? Breathing issues and Coughing per daughter.   Do you understand why you were in the hospital? Yes  Do you understand the discharge instrcutions? Yes     Items Reviewed:  Medications reviewed: Yes   Allergies reviewed: Yes   Dietary changes reviewed: Diabetic   Referrals reviewed: Appointment scheduled   Functional Questionnaire:   Activities of Daily Living (ADLs): Needs assistance with all ADL's  Any patient concerns? Getting [place] per daughter.   Confirmed importance and date/time of follow-up visits scheduled: Yes   Confirmed with patient if condition begins to worsen call PCP or go to the ER. Yes   Patient was given the office number and encouragred to call back with questions or concerns.Yes

## 2018-08-21 DIAGNOSIS — E1122 Type 2 diabetes mellitus with diabetic chronic kidney disease: Secondary | ICD-10-CM | POA: Diagnosis not present

## 2018-08-21 DIAGNOSIS — E039 Hypothyroidism, unspecified: Secondary | ICD-10-CM | POA: Diagnosis not present

## 2018-08-21 DIAGNOSIS — J9610 Chronic respiratory failure, unspecified whether with hypoxia or hypercapnia: Secondary | ICD-10-CM | POA: Diagnosis not present

## 2018-08-21 DIAGNOSIS — I5032 Chronic diastolic (congestive) heart failure: Secondary | ICD-10-CM | POA: Diagnosis not present

## 2018-08-21 DIAGNOSIS — B1920 Unspecified viral hepatitis C without hepatic coma: Secondary | ICD-10-CM | POA: Diagnosis not present

## 2018-08-21 DIAGNOSIS — J439 Emphysema, unspecified: Secondary | ICD-10-CM | POA: Diagnosis not present

## 2018-08-21 DIAGNOSIS — I130 Hypertensive heart and chronic kidney disease with heart failure and stage 1 through stage 4 chronic kidney disease, or unspecified chronic kidney disease: Secondary | ICD-10-CM | POA: Diagnosis not present

## 2018-08-21 DIAGNOSIS — F329 Major depressive disorder, single episode, unspecified: Secondary | ICD-10-CM | POA: Diagnosis not present

## 2018-08-22 ENCOUNTER — Telehealth: Payer: Self-pay | Admitting: Family Medicine

## 2018-08-22 DIAGNOSIS — I130 Hypertensive heart and chronic kidney disease with heart failure and stage 1 through stage 4 chronic kidney disease, or unspecified chronic kidney disease: Secondary | ICD-10-CM | POA: Diagnosis not present

## 2018-08-22 DIAGNOSIS — B1920 Unspecified viral hepatitis C without hepatic coma: Secondary | ICD-10-CM | POA: Diagnosis not present

## 2018-08-22 DIAGNOSIS — J439 Emphysema, unspecified: Secondary | ICD-10-CM | POA: Diagnosis not present

## 2018-08-22 DIAGNOSIS — J9610 Chronic respiratory failure, unspecified whether with hypoxia or hypercapnia: Secondary | ICD-10-CM | POA: Diagnosis not present

## 2018-08-22 DIAGNOSIS — E039 Hypothyroidism, unspecified: Secondary | ICD-10-CM | POA: Diagnosis not present

## 2018-08-22 DIAGNOSIS — F329 Major depressive disorder, single episode, unspecified: Secondary | ICD-10-CM | POA: Diagnosis not present

## 2018-08-22 DIAGNOSIS — E1122 Type 2 diabetes mellitus with diabetic chronic kidney disease: Secondary | ICD-10-CM | POA: Diagnosis not present

## 2018-08-22 DIAGNOSIS — I5032 Chronic diastolic (congestive) heart failure: Secondary | ICD-10-CM | POA: Diagnosis not present

## 2018-08-22 NOTE — Telephone Encounter (Signed)
Dr Carollee Herter -- please review and advise if you have any additional recommendation. Left detailed message on voicemail at below # that it is ok to proceed with orders as requested and to call if any further questions.

## 2018-08-22 NOTE — Telephone Encounter (Signed)
agree

## 2018-08-22 NOTE — Telephone Encounter (Unsigned)
Copied from [place] [phone]. Topic: Quick Communication - Home Health Verbal Orders >> Aug 22, 2018  2:36 PM Carolyn Stare wrote: Caller/Agency   Melisa with [facility]   Callback Number  [phone]  [email or web address]   Skilled Nursing     Frequency   10 visits plus 2 prn

## 2018-08-22 NOTE — Telephone Encounter (Signed)
Copied from [place] [phone]. Topic: Quick Communication - Home Health Verbal Orders >> Aug 22, 2018 12:15 PM Valla Leaver wrote: Caller/Agency: [facility] Number: [phone] Requesting OT/PT/Skilled Nursing/Social Work: PT Frequency: 1x awk for 8wks

## 2018-08-24 ENCOUNTER — Telehealth: Payer: Self-pay | Admitting: Family Medicine

## 2018-08-24 NOTE — Telephone Encounter (Signed)
Copied from [place] [phone]. Topic: Quick Communication - Home Health Verbal Orders >> Aug 24, 2018 11:05 AM Blase Mess A wrote: Caller/Agency: [facility] Number: [phone] Requesting OT/PT/Skilled Nursing/Social Work: Skilled Nursing Frequency: 1 week 1, 2 week 1, 1 week 7

## 2018-08-27 NOTE — Telephone Encounter (Signed)
[facility] is calling in for orders below they stated they still havent received

## 2018-08-27 NOTE — Telephone Encounter (Signed)
[place] notified of verbal orders ok per Dr. Etter Sjogren.

## 2018-08-27 NOTE — Telephone Encounter (Signed)
[name] called and said that they have been trying to reach the office since December 10th for orders for skilled nursing and they are not getting a reply. They can not go back out and see the patient per medicare guidelines until this is approved. There is 2 encounters open for this.   1 week 1, 2 week 1, 1 week 7 with two prn's for diabetes and copd education  Call back @ [phone]

## 2018-08-29 ENCOUNTER — Telehealth: Payer: Self-pay | Admitting: *Deleted

## 2018-08-29 DIAGNOSIS — J439 Emphysema, unspecified: Secondary | ICD-10-CM | POA: Diagnosis not present

## 2018-08-29 DIAGNOSIS — I5032 Chronic diastolic (congestive) heart failure: Secondary | ICD-10-CM | POA: Diagnosis not present

## 2018-08-29 DIAGNOSIS — B1920 Unspecified viral hepatitis C without hepatic coma: Secondary | ICD-10-CM | POA: Diagnosis not present

## 2018-08-29 DIAGNOSIS — I130 Hypertensive heart and chronic kidney disease with heart failure and stage 1 through stage 4 chronic kidney disease, or unspecified chronic kidney disease: Secondary | ICD-10-CM | POA: Diagnosis not present

## 2018-08-29 DIAGNOSIS — F329 Major depressive disorder, single episode, unspecified: Secondary | ICD-10-CM | POA: Diagnosis not present

## 2018-08-29 DIAGNOSIS — J9610 Chronic respiratory failure, unspecified whether with hypoxia or hypercapnia: Secondary | ICD-10-CM | POA: Diagnosis not present

## 2018-08-29 DIAGNOSIS — E039 Hypothyroidism, unspecified: Secondary | ICD-10-CM | POA: Diagnosis not present

## 2018-08-29 DIAGNOSIS — E1122 Type 2 diabetes mellitus with diabetic chronic kidney disease: Secondary | ICD-10-CM | POA: Diagnosis not present

## 2018-08-29 NOTE — Telephone Encounter (Signed)
Received Physician Orders from [facility]; forwarded to provider/SLS 12/18

## 2018-08-29 NOTE — Telephone Encounter (Signed)
Left message on machine with verbal orders. 

## 2018-08-30 ENCOUNTER — Other Ambulatory Visit: Payer: Self-pay | Admitting: Family Medicine

## 2018-08-30 ENCOUNTER — Encounter: Payer: Self-pay | Admitting: Family Medicine

## 2018-08-30 ENCOUNTER — Ambulatory Visit (INDEPENDENT_AMBULATORY_CARE_PROVIDER_SITE_OTHER): Payer: Medicare HMO | Admitting: Family Medicine

## 2018-08-30 VITALS — BP 115/63 | HR 84 | Temp 98.5°F | Resp 18 | Ht 63.0 in | Wt 302.2 lb

## 2018-08-30 DIAGNOSIS — Z6841 Body Mass Index (BMI) 40.0 and over, adult: Secondary | ICD-10-CM | POA: Diagnosis not present

## 2018-08-30 DIAGNOSIS — E669 Obesity, unspecified: Secondary | ICD-10-CM

## 2018-08-30 DIAGNOSIS — N179 Acute kidney failure, unspecified: Secondary | ICD-10-CM | POA: Diagnosis not present

## 2018-08-30 DIAGNOSIS — E876 Hypokalemia: Secondary | ICD-10-CM

## 2018-08-30 DIAGNOSIS — N182 Chronic kidney disease, stage 2 (mild): Secondary | ICD-10-CM

## 2018-08-30 LAB — COMPREHENSIVE METABOLIC PANEL
ALT: 10 U/L (ref 0–35)
AST: 18 U/L (ref 0–37)
Albumin: 3.6 g/dL (ref 3.5–5.2)
Alkaline Phosphatase: 90 U/L (ref 39–117)
BUN: 15 mg/dL (ref 6–23)
CO2: 31 mEq/L (ref 19–32)
Calcium: 9.9 mg/dL (ref 8.4–10.5)
Chloride: 97 mEq/L (ref 96–112)
Creatinine, Ser: 1.55 mg/dL — ABNORMAL HIGH (ref 0.40–1.20)
GFR: 43.01 mL/min — ABNORMAL LOW (ref >60.00)
Glucose, Bld: 156 mg/dL — ABNORMAL HIGH (ref 70–99)
Potassium: 4.5 mEq/L (ref 3.5–5.1)
Sodium: 138 mEq/L (ref 135–145)
Total Bilirubin: 0.4 mg/dL (ref 0.2–1.2)
Total Protein: 8 g/dL (ref 6.0–8.3)

## 2018-08-30 LAB — CBC WITH DIFFERENTIAL/PLATELET
BASOS ABS: 0 10*3/uL (ref 0.0–0.1)
Basophils Relative: 0.3 % (ref 0.0–3.0)
Eosinophils Absolute: 0.1 10*3/uL (ref 0.0–0.7)
Eosinophils Relative: 1.3 % (ref 0.0–5.0)
HCT: 36.8 % (ref 36.0–46.0)
Hemoglobin: 12.2 g/dL (ref 12.0–15.0)
Lymphocytes Relative: 44.3 % (ref 12.0–46.0)
Lymphs Abs: 4 10*3/uL (ref 0.7–4.0)
MCHC: 33.1 g/dL (ref 30.0–36.0)
MCV: 103.3 fl — ABNORMAL HIGH (ref 78.0–100.0)
Monocytes Absolute: 0.7 10*3/uL (ref 0.1–1.0)
Monocytes Relative: 7.8 % (ref 3.0–12.0)
Neutro Abs: 4.2 10*3/uL (ref 1.4–7.7)
Neutrophils Relative %: 46.3 % (ref 43.0–77.0)
Platelets: 228 10*3/uL (ref 150.0–400.0)
RBC: 3.56 Mil/uL — ABNORMAL LOW (ref 3.87–5.11)
RDW: 15.4 % (ref 11.5–15.5)
WBC: 9.1 10*3/uL (ref 4.0–10.5)

## 2018-08-30 MED ORDER — POTASSIUM CHLORIDE CRYS ER 20 MEQ PO TBCR: 20.0000 meq | EXTENDED_RELEASE_TABLET | Freq: Every day | ORAL | 1 refills | Status: DC

## 2018-08-30 NOTE — Assessment & Plan Note (Signed)
Recheck labs today. 

## 2018-08-30 NOTE — Progress Notes (Signed)
Patient ID: Sue [name], female    DOB: [birth year]  Age: 67 y.o. MRN: [id number]    Subjective:  Subjective  HPI Sue [name] presents for hosp f/u 12/4-12/7 for chf, copd ckd iii, hypothyroidism, and htn-- she presented to er with worsening sosb and cough.  She also had preressive polyuria and polydipsia.  She was found to have a BG 576, normal AG and creatinine 2.8--- her basline was 1.5 She has AKI from dehydration from new onset DM Her seroquel was stopped and she was started on IV insulin .. she was transitioned to SQ lantus and blood sugars were reasonable controlled  She was d/c with lantus, metformin and glipizide. Her cr improved with IVF to 1.8     Review of Systems  Constitutional: Negative for appetite change, diaphoresis, fatigue and unexpected weight change.  Eyes: Negative for pain, redness and visual disturbance.  Respiratory: Positive for shortness of breath. Negative for cough, chest tightness and wheezing.   Cardiovascular: Negative for chest pain, palpitations and leg swelling.  Endocrine: Negative for cold intolerance, heat intolerance, polydipsia, polyphagia and polyuria.  Genitourinary: Negative for difficulty urinating, dysuria and frequency.  Neurological: Negative for dizziness, light-headedness, numbness and headaches.    History Past Medical History:  Diagnosis Date  . Allergic rhinitis   . Depression   . Emphysema of lung ([place])   . GERD (gastroesophageal reflux disease)   . Hypertension   . Hypothyroidism   . Stroke [facility]) 2016   TIA   . Urine incontinence     She has a past surgical history that includes Abdominal hysterectomy and Cesarean section.   Her family history includes Asthma in her son and son; Bipolar disorder in her father; Depression in her father; Diabetes in her sister; Heart disease in her brother, father, paternal aunt, paternal grandmother, and paternal uncle; Heart disease (age of onset: 70) in her sister; Hyperlipidemia in her  sister; Hypertension in her brother, father, and sister; Schizophrenia in her paternal aunt.She reports that she quit smoking about 6 years ago. Her smoking use included cigarettes. She started smoking about 46 years ago. She has a 40.00 pack-year smoking history. She has never used smokeless tobacco. She reports current alcohol use. She reports that she does not use drugs.  Current Outpatient Medications on File Prior to Visit  Medication Sig Dispense Refill  . albuterol (PROVENTIL HFA;VENTOLIN HFA) 108 (90 Base) MCG/ACT inhaler Inhale 1-2 puffs into the lungs every 6 (six) hours as needed for wheezing or shortness of breath.    Marland Kitchen albuterol (PROVENTIL) (2.5 MG/3ML) 0.083% nebulizer solution Take 3 mLs (2.5 mg total) by nebulization every 6 (six) hours as needed for wheezing or shortness of breath. 150 mL 1  . aspirin EC 81 MG EC tablet Take 1 tablet (81 mg total) by mouth daily.    . blood glucose meter kit and supplies KIT Dispense based on patient and insurance preference. Use up to four times daily as directed. (FOR ICD-9 250.00, 250.01). 1 each 0  . busPIRone (BUSPAR) 15 MG tablet Take 22 mg by mouth daily.   4  . fluticasone (FLONASE) 50 MCG/ACT nasal spray Place 2 sprays into both nostrils daily as needed for allergies.    . fluticasone (FLOVENT HFA) 110 MCG/ACT inhaler Inhale 2 puffs into the lungs 2 (two) times daily. 1 Inhaler 0  . glipiZIDE (GLUCOTROL) 5 MG tablet Take 0.5 tablets (2.5 mg total) by mouth 2 (two) times daily before a meal. 30 tablet 11  .  insulin glargine (LANTUS) 100 UNIT/ML injection Inject 0.3 mLs (30 Units total) into the skin at bedtime. 10 mL 11  . levothyroxine (SYNTHROID, LEVOTHROID) 50 MCG tablet TAKE ONE TABLET BY MOUTH ONCE DAILY 30 tablet 11  . LORazepam (ATIVAN) 2 MG tablet Take 0.5-1 mg by mouth as directed. 0.5 Mg in the afternoon and 1 MG at bedtime 30 tablet 0  . metFORMIN (GLUCOPHAGE) 500 MG tablet Take 2 tablets (1,000 mg total) by mouth 2 (two) times  daily with a meal. 120 tablet 11  . metoprolol succinate (TOPROL-XL) 50 MG 24 hr tablet TAKE 2 TABLETS BY MOUTH ONCE DAILY IN THE MORNING WITH A MEAL OR IMMEDIATELY FOLLOWING A MEAL (Patient taking differently: Take 100 mg by mouth daily. ) 180 tablet 1  . OXYGEN Inhale 3 L into the lungs continuous.     . ranitidine (ZANTAC) 150 MG tablet TAKE 1 TABLET BY MOUTH ONCE DAILY AT BEDTIME 30 tablet 1  . zolpidem (AMBIEN) 5 MG tablet Take 2 tablets (10 mg total) by mouth at bedtime.    . torsemide (DEMADEX) 20 MG tablet Take 1 tablet (20 mg total) by mouth daily. 180 tablet 3   No current facility-administered medications on file prior to visit.      Objective:  Objective  Physical Exam Vitals signs and nursing note reviewed.  Constitutional:      Appearance: She is well-developed.  HENT:     Head: Normocephalic and atraumatic.  Eyes:     Conjunctiva/sclera: Conjunctivae normal.  Neck:     Musculoskeletal: Normal range of motion and neck supple.     Thyroid: No thyromegaly.     Vascular: No carotid bruit or JVD.  Cardiovascular:     Rate and Rhythm: Normal rate and regular rhythm.     Heart sounds: Normal heart sounds. No murmur.  Pulmonary:     Effort: Pulmonary effort is normal. No respiratory distress.     Breath sounds: Normal breath sounds. No wheezing or rales.  Chest:     Chest wall: No tenderness.  Neurological:     Mental Status: She is alert and oriented to person, place, and time.    BP 115/63 (BP Location: Right Arm, Cuff Size: Large)   Pulse 84   Temp 98.5 F (36.9 C) (Oral)   Resp 18   Ht _0  (1.6 m)   Wt (!) 302 lb 3.2 oz (137.1 kg)   SpO2 95% Comment: on 3 liters of o2  BMI 53.53 kg/m  Wt Readings from Last 3 Encounters:  08/30/18 (!) 302 lb 3.2 oz (137.1 kg)  08/18/18 (!) 314 lb 6.4 oz (142.6 kg)  06/05/18 (!) 315 lb (142.9 kg)     Lab Results  Component Value Date   WBC 9.1 08/30/2018   HGB 12.2 08/30/2018   HCT 36.8 08/30/2018   PLT 228.0  08/30/2018   GLUCOSE 156 (H) 08/30/2018   CHOL 126 02/27/2017   TRIG 77.0 02/27/2017   HDL 30.30 (L) 02/27/2017   LDLCALC 81 02/27/2017   ALT 10 08/30/2018   AST 18 08/30/2018   NA 138 08/30/2018   K 4.5 08/30/2018   CL 97 08/30/2018   CREATININE 1.55 (H) 08/30/2018   BUN 15 08/30/2018   CO2 31 08/30/2018   TSH 4.31 02/27/2017   INR 1.10 05/01/2017   HGBA1C 10.6 (H) 08/16/2018    Dg Chest 2 View  Result Date: 08/15/2018 CLINICAL DATA:  Dyspnea times 3-4 weeks. EXAM: CHEST - 2 VIEW  COMPARISON:  10/19/2017 FINDINGS: The heart size and mediastinal contours are within normal limits. Chronic diffuse mild bilateral interstitial pulmonary changes are stable. No alveolar consolidation, effusion or pneumothorax. The visualized skeletal structures are unremarkable. IMPRESSION: Stable interstitial prominence of the lungs, nonspecific but may reflect chronic interstitial lung disease. Electronically Signed   By: Ashley Royalty M.D.   On: 08/15/2018 20:02     Assessment & Plan:  Plan  I have changed Letta Median E. Lupinski's potassium chloride SA. I am also having her maintain her OXYGEN, LORazepam, albuterol, albuterol, aspirin, fluticasone, zolpidem, levothyroxine, busPIRone, torsemide, ranitidine, metoprolol succinate, insulin glargine, metFORMIN, glipiZIDE, blood glucose meter kit and supplies, and fluticasone.  Meds ordered this encounter  Medications  . potassium chloride SA (K-DUR,KLOR-CON) 20 MEQ tablet    Sig: Take 1 tablet (20 mEq total) by mouth daily.    Dispense:  90 tablet    Refill:  1    Please consider 90 day supplies to promote better adherence    Problem List Items Addressed This Visit      Unprioritized   ARF (acute renal failure) ([place])    Recheck labs today      Hypokalemia   Relevant Medications   potassium chloride SA (K-DUR,KLOR-CON) 20 MEQ tablet   Other Relevant Orders   CBC with Differential/Platelet (Completed)   Comprehensive metabolic panel (Completed)   Morbid  obesity due to excess calories ([place])    Refer to healthy weight and wellness      Obesity (BMI 30-39.9)   Relevant Orders   Amb Ref to Medical Weight Management    Other Visit Diagnoses    CRF (chronic renal failure), stage 2 (mild)    -  Primary   Relevant Orders   Ambulatory referral to Nephrology   CBC with Differential/Platelet (Completed)   Comprehensive metabolic panel (Completed)      Follow-up: Return in about 3 months (around 11/29/2018) for hypertension, hyperlipidemia, diabetes II.  Ann Held, DO

## 2018-08-30 NOTE — Assessment & Plan Note (Signed)
-   Refer to healthy weight and wellness 

## 2018-08-30 NOTE — Patient Instructions (Signed)
Carbohydrate Counting for Diabetes Mellitus, Adult  Carbohydrate counting is a method of keeping track of how many carbohydrates you eat. Eating carbohydrates naturally increases the amount of sugar (glucose) in the blood. Counting how many carbohydrates you eat helps keep your blood glucose within normal limits, which helps you manage your diabetes (diabetes mellitus). It is important to know how many carbohydrates you can safely have in each meal. This is different for every person. A diet and nutrition specialist (registered dietitian) can help you make a meal plan and calculate how many carbohydrates you should have at each meal and snack. Carbohydrates are found in the following foods:  Grains, such as breads and cereals.  Dried beans and soy products.  Starchy vegetables, such as potatoes, peas, and corn.  Fruit and fruit juices.  Milk and yogurt.  Sweets and snack foods, such as cake, cookies, candy, chips, and soft drinks. How do I count carbohydrates? There are two ways to count carbohydrates in food. You can use either of the methods or a combination of both. Reading "Nutrition Facts" on packaged food The "Nutrition Facts" list is included on the labels of almost all packaged foods and beverages in the [place] It includes:  The serving size.  Information about nutrients in each serving, including the grams (g) of carbohydrate per serving. To use the "Nutrition Facts":  Decide how many servings you will have.  Multiply the number of servings by the number of carbohydrates per serving.  The resulting number is the total amount of carbohydrates that you will be having. Learning standard serving sizes of other foods When you eat carbohydrate foods that are not packaged or do not include "Nutrition Facts" on the label, you need to measure the servings in order to count the amount of carbohydrates:  Measure the foods that you will eat with a food scale or measuring cup, if needed.   Decide how many standard-size servings you will eat.  Multiply the number of servings by 15. Most carbohydrate-rich foods have about 15 g of carbohydrates per serving. ? For example, if you eat 8 oz (170 g) of strawberries, you will have eaten 2 servings and 30 g of carbohydrates (2 servings x 15 g = 30 g).  For foods that have more than one food mixed, such as soups and casseroles, you must count the carbohydrates in each food that is included. The following list contains standard serving sizes of common carbohydrate-rich foods. Each of these servings has about 15 g of carbohydrates:   hamburger bun or  English muffin.   oz (15 mL) syrup.   oz (14 g) jelly.  1 slice of bread.  1 six-inch tortilla.  3 oz (85 g) cooked rice or pasta.  4 oz (113 g) cooked dried beans.  4 oz (113 g) starchy vegetable, such as peas, corn, or potatoes.  4 oz (113 g) hot cereal.  4 oz (113 g) mashed potatoes or  of a large baked potato.  4 oz (113 g) canned or frozen fruit.  4 oz (120 mL) fruit juice.  4-6 crackers.  6 chicken nuggets.  6 oz (170 g) unsweetened dry cereal.  6 oz (170 g) plain fat-free yogurt or yogurt sweetened with artificial sweeteners.  8 oz (240 mL) milk.  8 oz (170 g) fresh fruit or one small piece of fruit.  24 oz (680 g) popped popcorn. Example of carbohydrate counting Sample meal  3 oz (85 g) chicken breast.  6 oz (170 g)   brown rice.  4 oz (113 g) corn.  8 oz (240 mL) milk.  8 oz (170 g) strawberries with sugar-free whipped topping. Carbohydrate calculation 1. Identify the foods that contain carbohydrates: ? Rice. ? Corn. ? Milk. ? Strawberries. 2. Calculate how many servings you have of each food: ? 2 servings rice. ? 1 serving corn. ? 1 serving milk. ? 1 serving strawberries. 3. Multiply each number of servings by 15 g: ? 2 servings rice x 15 g = 30 g. ? 1 serving corn x 15 g = 15 g. ? 1 serving milk x 15 g = 15 g. ? 1 serving  strawberries x 15 g = 15 g. 4. Add together all of the amounts to find the total grams of carbohydrates eaten: ? 30 g + 15 g + 15 g + 15 g = 75 g of carbohydrates total. Summary  Carbohydrate counting is a method of keeping track of how many carbohydrates you eat.  Eating carbohydrates naturally increases the amount of sugar (glucose) in the blood.  Counting how many carbohydrates you eat helps keep your blood glucose within normal limits, which helps you manage your diabetes.  A diet and nutrition specialist (registered dietitian) can help you make a meal plan and calculate how many carbohydrates you should have at each meal and snack. This information is not intended to replace advice given to you by your health care provider. Make sure you discuss any questions you have with your health care provider. Document Released: 08/29/2005 Document Revised: 03/08/2017 Document Reviewed: 02/10/2016 Elsevier Interactive Patient Education  2019 Elsevier Inc.  

## 2018-09-04 DIAGNOSIS — J439 Emphysema, unspecified: Secondary | ICD-10-CM | POA: Diagnosis not present

## 2018-09-04 DIAGNOSIS — E1122 Type 2 diabetes mellitus with diabetic chronic kidney disease: Secondary | ICD-10-CM | POA: Diagnosis not present

## 2018-09-04 DIAGNOSIS — I5032 Chronic diastolic (congestive) heart failure: Secondary | ICD-10-CM | POA: Diagnosis not present

## 2018-09-04 DIAGNOSIS — E039 Hypothyroidism, unspecified: Secondary | ICD-10-CM | POA: Diagnosis not present

## 2018-09-04 DIAGNOSIS — B1920 Unspecified viral hepatitis C without hepatic coma: Secondary | ICD-10-CM | POA: Diagnosis not present

## 2018-09-04 DIAGNOSIS — F329 Major depressive disorder, single episode, unspecified: Secondary | ICD-10-CM | POA: Diagnosis not present

## 2018-09-04 DIAGNOSIS — J9610 Chronic respiratory failure, unspecified whether with hypoxia or hypercapnia: Secondary | ICD-10-CM | POA: Diagnosis not present

## 2018-09-04 DIAGNOSIS — I130 Hypertensive heart and chronic kidney disease with heart failure and stage 1 through stage 4 chronic kidney disease, or unspecified chronic kidney disease: Secondary | ICD-10-CM | POA: Diagnosis not present

## 2018-09-06 ENCOUNTER — Telehealth: Payer: Self-pay | Admitting: Family Medicine

## 2018-09-06 NOTE — Telephone Encounter (Unsigned)
Copied from [place] [phone]. Topic: Quick Communication - Rx Refill/Question >> Sep 06, 2018  3:29 PM Mcneil, Jacinto Reap wrote: Pt stated that she received a sample of Spiriva 2.5 mcg and she is need of a prescription for the medication.   Medication: Spiriva 2.5 mcg  Has the patient contacted their pharmacy? no  Preferred Pharmacy (with phone number or street name): [place], [place]  [phone] (Phone) [phone] (Fax)  Agent: Please be advised that RX refills may take up to 3 business days. We ask that you follow-up with your pharmacy.

## 2018-09-06 NOTE — Telephone Encounter (Signed)
See result note.  

## 2018-09-06 NOTE — Telephone Encounter (Signed)
Copied from [place] [id number]. Topic: General - Other >> Sep 06, 2018 10:10 AM Yvette Rack wrote: Reason for CRM: pt calling back about lab results

## 2018-09-06 NOTE — Telephone Encounter (Signed)
This should go to pulmonology.  Note sent to [facility] to redirect to pulmonology

## 2018-09-06 NOTE — Telephone Encounter (Signed)
Pt stated that she received a sample of Spiriva 2.5 mcg and she is now requesting a prescription for the medication.   Medication: Spiriva 2.5 mcg     [place], [place]  [phone] (Phone) [phone] (Fax)

## 2018-09-07 ENCOUNTER — Other Ambulatory Visit: Payer: Self-pay | Admitting: *Deleted

## 2018-09-07 DIAGNOSIS — I10 Essential (primary) hypertension: Secondary | ICD-10-CM

## 2018-09-07 DIAGNOSIS — E119 Type 2 diabetes mellitus without complications: Secondary | ICD-10-CM

## 2018-09-11 ENCOUNTER — Ambulatory Visit: Payer: Medicare HMO | Admitting: *Deleted

## 2018-09-11 ENCOUNTER — Encounter

## 2018-09-14 DIAGNOSIS — I5032 Chronic diastolic (congestive) heart failure: Secondary | ICD-10-CM | POA: Diagnosis not present

## 2018-09-14 DIAGNOSIS — B1920 Unspecified viral hepatitis C without hepatic coma: Secondary | ICD-10-CM | POA: Diagnosis not present

## 2018-09-14 DIAGNOSIS — J439 Emphysema, unspecified: Secondary | ICD-10-CM | POA: Diagnosis not present

## 2018-09-14 DIAGNOSIS — E039 Hypothyroidism, unspecified: Secondary | ICD-10-CM | POA: Diagnosis not present

## 2018-09-14 DIAGNOSIS — F329 Major depressive disorder, single episode, unspecified: Secondary | ICD-10-CM | POA: Diagnosis not present

## 2018-09-14 DIAGNOSIS — J9610 Chronic respiratory failure, unspecified whether with hypoxia or hypercapnia: Secondary | ICD-10-CM | POA: Diagnosis not present

## 2018-09-14 DIAGNOSIS — I130 Hypertensive heart and chronic kidney disease with heart failure and stage 1 through stage 4 chronic kidney disease, or unspecified chronic kidney disease: Secondary | ICD-10-CM | POA: Diagnosis not present

## 2018-09-14 DIAGNOSIS — E1122 Type 2 diabetes mellitus with diabetic chronic kidney disease: Secondary | ICD-10-CM | POA: Diagnosis not present

## 2018-09-18 DIAGNOSIS — F319 Bipolar disorder, unspecified: Secondary | ICD-10-CM | POA: Diagnosis not present

## 2018-09-19 ENCOUNTER — Telehealth: Payer: Self-pay | Admitting: *Deleted

## 2018-09-19 DIAGNOSIS — J449 Chronic obstructive pulmonary disease, unspecified: Secondary | ICD-10-CM | POA: Diagnosis not present

## 2018-09-19 DIAGNOSIS — J439 Emphysema, unspecified: Secondary | ICD-10-CM | POA: Diagnosis not present

## 2018-09-19 DIAGNOSIS — J9601 Acute respiratory failure with hypoxia: Secondary | ICD-10-CM | POA: Diagnosis not present

## 2018-09-19 NOTE — Telephone Encounter (Signed)
Received Physician Orders from [facility]; forwarded to provider/SLS 01/08  

## 2018-09-19 NOTE — Telephone Encounter (Signed)
Received Home Health Certification and Plan of Care; forwarded to provider/SLS 01/08

## 2018-10-01 ENCOUNTER — Other Ambulatory Visit: Payer: Self-pay | Admitting: Family Medicine

## 2018-10-01 NOTE — Telephone Encounter (Signed)
Copied from [employer] [id number]. Topic: Quick Communication - Rx Refill/Question >> Oct 01, 2018  8:29 AM Selinda Flavin B, NT wrote: Medication: Glucose test strips - accu check Accu check fast click lancets insulin glargine (LANTUS) 100 UNIT/ML injection   Has the patient contacted their pharmacy? Yes.   (Agent: If no, request that the patient contact the pharmacy for the refill.) (Agent: If yes, when and what did the pharmacy advise?)  Preferred Pharmacy (with phone number or street name): [facility] [address] [place], [place]: Please be advised that RX refills may take up to 3 business days. We ask that you follow-up with your pharmacy.

## 2018-10-01 NOTE — Telephone Encounter (Signed)
Requested medication (s) are due for refill today: yes  Requested medication (s) are on the active medication list: yes  Last refill:  Last refilled by a different provider  Future visit scheduled: No  Notes to clinic:  Unable to refill per protocol. Medication last filled by a different provider    Requested Prescriptions  Pending Prescriptions Disp Refills   insulin glargine (LANTUS) 100 UNIT/ML injection 10 mL 11    Sig: Inject 0.3 mLs (30 Units total) into the skin at bedtime.     Endocrinology:  Diabetes - Insulins Failed - 10/01/2018  8:59 AM      Failed - HBA1C is between 0 and 7.9 and within 180 days    Hgb A1c MFr Bld  Date Value Ref Range Status  08/16/2018 10.6 (H) 4.8 - 5.6 % Final    Comment:    (NOTE) Pre diabetes:          5.7%-6.4% Diabetes:              >6.4% Glycemic control for   <7.0% adults with diabetes          Passed - Valid encounter within last 6 months    Recent Outpatient Visits          1 month ago CRF (chronic renal failure), stage 2 (mild)   [facility] at [place], [place]   11 months ago Lower extremity edema   [facility] at [place], [place]   11 months ago [place] [facility] [employer] [facility] [facility], [place], DO   1 year ago Acute bronchitis with COPD [facility])   [facility] at [place], DO   1 year ago Need for influenza vaccination   [facility] at [place], DO            blood glucose meter kit and supplies KIT 1 each 0    Sig: Dispense based on patient and insurance preference. Use up to four times daily as directed. (FOR ICD-9 250.00, 250.01).     Endocrinology: Diabetes - Testing Supplies Passed - 10/01/2018  8:59 AM      Passed - Valid encounter within last 12 months    Recent Outpatient  Visits          1 month ago CRF (chronic renal failure), stage 2 (mild)   [facility] at [place], [place]   11 months ago Lower extremity edema   [facility]ivist at [place], [place]   11 months ago [place] at [place], DO   1 year ago Acute bronchitis with COPD [facility])   [facility] at [place], DO   1 year ago Need for influenza vaccination   [facility] at [place], [place]

## 2018-10-02 NOTE — Telephone Encounter (Signed)
Patient is calling to state she is completely out of medication advise the patient of the turnaround time that could be 24-72. Patient is requesting a call back once medication is completed.

## 2018-10-03 MED ORDER — INSULIN GLARGINE 100 UNIT/ML ~~LOC~~ SOLN: 30.0000 [IU] | Freq: Every day | SUBCUTANEOUS | 11 refills | Status: DC

## 2018-10-03 MED ORDER — BLOOD GLUCOSE MONITOR KIT: PACK | 0 refills | Status: DC

## 2018-10-03 NOTE — Telephone Encounter (Signed)
Pt states she just got off the phone with the pharmacy and they do not have these scripts. Please Advise

## 2018-10-04 ENCOUNTER — Telehealth: Payer: Self-pay | Admitting: Family Medicine

## 2018-10-04 ENCOUNTER — Other Ambulatory Visit: Payer: Self-pay

## 2018-10-04 MED ORDER — BLOOD GLUCOSE MONITOR KIT: PACK | 0 refills | Status: DC

## 2018-10-04 NOTE — Telephone Encounter (Signed)
Daughter came in and picked up.

## 2018-10-04 NOTE — Telephone Encounter (Signed)
Copied from [place]. Topic: Quick Communication - See Telephone Encounter >> Oct 04, 2018  2:19 PM Blase Mess A wrote: CRM for notification. See Telephone encounter for: 10/04/18.  Patient is calling because the lancets and glucose strips have not be received by the pharmacy despite  the script being sent to the pharmacy Patient is requesting paper script and her daughter will come and pick up this afternoon. Please advise.

## 2018-10-10 ENCOUNTER — Telehealth: Payer: Self-pay | Admitting: *Deleted

## 2018-10-10 NOTE — Telephone Encounter (Signed)
Received Home Health Discharge/Transfer Summary from [facility]; forwarded to provider/SLS 01/29

## 2018-10-20 DIAGNOSIS — J9601 Acute respiratory failure with hypoxia: Secondary | ICD-10-CM | POA: Diagnosis not present

## 2018-10-20 DIAGNOSIS — J439 Emphysema, unspecified: Secondary | ICD-10-CM | POA: Diagnosis not present

## 2018-10-20 DIAGNOSIS — J449 Chronic obstructive pulmonary disease, unspecified: Secondary | ICD-10-CM | POA: Diagnosis not present

## 2018-11-05 ENCOUNTER — Other Ambulatory Visit: Payer: Self-pay | Admitting: Family Medicine

## 2018-11-05 DIAGNOSIS — E039 Hypothyroidism, unspecified: Secondary | ICD-10-CM

## 2018-11-18 DIAGNOSIS — J449 Chronic obstructive pulmonary disease, unspecified: Secondary | ICD-10-CM | POA: Diagnosis not present

## 2018-11-18 DIAGNOSIS — J9601 Acute respiratory failure with hypoxia: Secondary | ICD-10-CM | POA: Diagnosis not present

## 2018-11-18 DIAGNOSIS — J439 Emphysema, unspecified: Secondary | ICD-10-CM | POA: Diagnosis not present

## 2018-11-29 ENCOUNTER — Other Ambulatory Visit: Payer: Self-pay | Admitting: Family Medicine

## 2018-11-29 DIAGNOSIS — E039 Hypothyroidism, unspecified: Secondary | ICD-10-CM

## 2018-12-04 ENCOUNTER — Telehealth: Payer: Self-pay | Admitting: Family Medicine

## 2018-12-04 NOTE — Telephone Encounter (Signed)
Copied from [place] [id number]. Topic: Quick Communication - Rx Refill/Question >> Dec 04, 2018  2:53 PM Margot Ables wrote: Medication: lisinopril (PRINIVIL,ZESTRIL) 20 MG tablet - last filled 12/19 for 90 day supply - taking 1/day  Please advise if pt is supposed to take medication. It is on discontinued list. She reports taking daily.  Has the patient contacted their pharmacy? Yes - pt states they told her no response from MD on lisinopril and levothyroxine - advised pt levothyroxine was sent 11/30/2018  Preferred Pharmacy (with phone number or street name): [place], [place] [phone] (Phone) [phone] (Fax)

## 2018-12-05 NOTE — Telephone Encounter (Signed)
Cardiology was writing it--- she is overdue ov forcardiology ,  And was due this month in our office as well  under circumstances --  We can put it off a month or so but she needs to see cardiology Looks like NP there wrote lisinopril

## 2018-12-05 NOTE — Telephone Encounter (Signed)
Medication: lisinopril (PRINIVIL,ZESTRIL) 20 MG tablet - last filled 12/19 for 90 day supply - taking 1/day  Please advise if pt is supposed to take medication. It is on discontinued list. She reports taking daily.  Looks like it was taking off her list.  Can you please review?

## 2018-12-06 MED ORDER — INSULIN GLARGINE 100 UNIT/ML ~~LOC~~ SOLN: 30.0000 [IU] | Freq: Every day | SUBCUTANEOUS | 5 refills | Status: DC

## 2018-12-06 MED ORDER — "INSULIN SYRINGE-NEEDLE U-100 28G X 5/16"" 0.5 ML MISC": 1 refills | Status: DC

## 2018-12-06 NOTE — Telephone Encounter (Signed)
Patient will call cardiology office.  She also needed her lantus and syringes sent you.

## 2018-12-11 DIAGNOSIS — J449 Chronic obstructive pulmonary disease, unspecified: Secondary | ICD-10-CM | POA: Diagnosis not present

## 2018-12-11 DIAGNOSIS — J9601 Acute respiratory failure with hypoxia: Secondary | ICD-10-CM | POA: Diagnosis not present

## 2018-12-19 DIAGNOSIS — J9601 Acute respiratory failure with hypoxia: Secondary | ICD-10-CM | POA: Diagnosis not present

## 2018-12-19 DIAGNOSIS — J439 Emphysema, unspecified: Secondary | ICD-10-CM | POA: Diagnosis not present

## 2018-12-19 DIAGNOSIS — J449 Chronic obstructive pulmonary disease, unspecified: Secondary | ICD-10-CM | POA: Diagnosis not present

## 2019-01-01 ENCOUNTER — Other Ambulatory Visit: Payer: Self-pay | Admitting: Nurse Practitioner

## 2019-01-03 ENCOUNTER — Other Ambulatory Visit: Payer: Self-pay

## 2019-01-03 ENCOUNTER — Ambulatory Visit (INDEPENDENT_AMBULATORY_CARE_PROVIDER_SITE_OTHER): Payer: Medicare HMO | Admitting: Family Medicine

## 2019-01-03 ENCOUNTER — Encounter: Payer: Self-pay | Admitting: Family Medicine

## 2019-01-03 ENCOUNTER — Other Ambulatory Visit: Payer: Self-pay | Admitting: Nurse Practitioner

## 2019-01-03 DIAGNOSIS — E039 Hypothyroidism, unspecified: Secondary | ICD-10-CM

## 2019-01-03 DIAGNOSIS — E876 Hypokalemia: Secondary | ICD-10-CM

## 2019-01-03 DIAGNOSIS — I10 Essential (primary) hypertension: Secondary | ICD-10-CM

## 2019-01-03 DIAGNOSIS — E785 Hyperlipidemia, unspecified: Secondary | ICD-10-CM

## 2019-01-03 DIAGNOSIS — E1151 Type 2 diabetes mellitus with diabetic peripheral angiopathy without gangrene: Secondary | ICD-10-CM

## 2019-01-03 DIAGNOSIS — IMO0002 Reserved for concepts with insufficient information to code with codable children: Secondary | ICD-10-CM

## 2019-01-03 DIAGNOSIS — E1165 Type 2 diabetes mellitus with hyperglycemia: Secondary | ICD-10-CM

## 2019-01-03 DIAGNOSIS — E1169 Type 2 diabetes mellitus with other specified complication: Secondary | ICD-10-CM

## 2019-01-03 MED ORDER — TORSEMIDE 20 MG PO TABS: 20.0000 mg | ORAL_TABLET | Freq: Every day | ORAL | 3 refills | Status: DC

## 2019-01-03 MED ORDER — LEVOTHYROXINE SODIUM 50 MCG PO TABS: 50.0000 ug | ORAL_TABLET | Freq: Every day | ORAL | 0 refills | Status: DC

## 2019-01-03 MED ORDER — POTASSIUM CHLORIDE CRYS ER 20 MEQ PO TBCR: 20.0000 meq | EXTENDED_RELEASE_TABLET | Freq: Every day | ORAL | 0 refills | Status: DC

## 2019-01-03 MED ORDER — LISINOPRIL 20 MG PO TABS: 20.0000 mg | ORAL_TABLET | Freq: Every day | ORAL | 0 refills | Status: DC

## 2019-01-03 MED ORDER — METOPROLOL SUCCINATE ER 50 MG PO TB24: ORAL_TABLET | ORAL | 1 refills | Status: DC

## 2019-01-03 NOTE — Progress Notes (Signed)
.Virtual Visit via Video Note  I connected with Sue [name] on 01/03/19 at 10:15 AM EDT by a video enabled telemedicine application and verified that I am speaking with the correct person using two identifiers.   I discussed the limitations of evaluation and management by telemedicine and the availability of in person appointments. The patient expressed understanding and agreed to proceed.  History of Present Illness: Pt is home and needs refills on med   No new complaints.   HYPERTENSION   Blood pressure range-good per pt --unable to check   Chest pain- no      Dyspnea- no Lightheadedness- no   Edema- not new  Other side effects - no   Medication compliance: good Low salt diet- yes    DIABETES    Blood Sugar ranges-good per pt   Polyuria- no New Visual problems- no  Hypoglycemic symptoms- no  Other side effects-no Medication compliance - good Last eye exam- due    HYPERLIPIDEMIA  Medication compliance- good RUQ pain- no  Muscle aches- no Other side effects-no   Past Medical History:  Diagnosis Date  . Allergic rhinitis   . Depression   . Emphysema of lung (HCC)   . GERD (gastroesophageal reflux disease)   . Hypertension   . Hypothyroidism   . Stroke (HCC) 2016   TIA   . Urine incontinence    Current Outpatient Medications on File Prior to Visit  Medication Sig Dispense Refill  . albuterol (PROVENTIL HFA;VENTOLIN HFA) 108 (90 Base) MCG/ACT inhaler Inhale 1-2 puffs into the lungs every 6 (six) hours as needed for wheezing or shortness of breath.    . albuterol (PROVENTIL) (2.5 MG/3ML) 0.083% nebulizer solution Take 3 mLs (2.5 mg total) by nebulization every 6 (six) hours as needed for wheezing or shortness of breath. 150 mL 1  . aspirin EC 81 MG EC tablet Take 1 tablet (81 mg total) by mouth daily.    . blood glucose meter kit and supplies KIT Dispense based on patient and insurance preference. Use up to four times daily as directed. (FOR ICD-9 250.00, 250.01). 1  each 0  . busPIRone (BUSPAR) 15 MG tablet Take 22 mg by mouth daily.   4  . fluticasone (FLONASE) 50 MCG/ACT nasal spray Place 2 sprays into both nostrils daily as needed for allergies.    . fluticasone (FLOVENT HFA) 110 MCG/ACT inhaler Inhale 2 puffs into the lungs 2 (two) times daily. 1 Inhaler 0  . glipiZIDE (GLUCOTROL) 5 MG tablet Take 0.5 tablets (2.5 mg total) by mouth 2 (two) times daily before a meal. 30 tablet 11  . insulin glargine (LANTUS) 100 UNIT/ML injection Inject 0.3 mLs (30 Units total) into the skin at bedtime. 10 mL 5  . Insulin Syringe-Needle U-100 28G X 5/16" 0.5 ML MISC Use with lantus once a day 100 each 1  . LORazepam (ATIVAN) 2 MG tablet Take 0.5-1 mg by mouth as directed. 0.5 Mg in the afternoon and 1 MG at bedtime 30 tablet 0  . metFORMIN (GLUCOPHAGE) 500 MG tablet Take 2 tablets (1,000 mg total) by mouth 2 (two) times daily with a meal. 120 tablet 11  . OXYGEN Inhale 3 L into the lungs continuous.     . potassium chloride SA (K-DUR,KLOR-CON) 20 MEQ tablet Take 1 tablet (20 mEq total) by mouth daily. 90 tablet 1  . ranitidine (ZANTAC) 150 MG tablet TAKE 1 TABLET BY MOUTH ONCE DAILY AT BEDTIME 30 tablet 1  . zolpidem (AMBIEN) 5   MG tablet Take 2 tablets (10 mg total) by mouth at bedtime.     No current facility-administered medications on file prior to visit.       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 Essential hypertension Well controlled, no changes to meds. Encouraged heart healthy diet such as the DASH diet and exercise as tolerated.   - lisinopril (ZESTRIL) 20 MG tablet; Take 1 tablet (20 mg total) by mouth daily.  Dispense: 90 tablet; Refill: 0 - Lipid panel; Future - Hemoglobin A1c; Future - Comprehensive metabolic panel; Future - Microalbumin / creatinine urine ratio; Future  2. DM (diabetes mellitus) type II uncontrolled,  periph vascular disorder ([place]) hgba1c to be checked, minimize simple carbs. Increase exercise as tolerated. Continue current meds  - Lipid panel; Future - Hemoglobin A1c; Future - Comprehensive metabolic panel; Future - Microalbumin / creatinine urine ratio; Future  3. Hyperlipidemia associated with type 2 diabetes mellitus ([place]) Encouraged heart healthy diet, increase exercise, avoid trans fats, consider a krill oil cap daily - Lipid panel; Future - Hemoglobin A1c; Future - Comprehensive metabolic panel; Future - Microalbumin / creatinine urine ratio; Future  4. Hypokalemia Refill meds Check labs  - potassium chloride SA (K-DUR) 20 MEQ tablet; Take 1 tablet (20 mEq total) by mouth daily.  Dispense: 30 tablet; Refill: 0  5. Hypothyroidism, unspecified type Check labs Refill meds  - levothyroxine (SYNTHROID) 50 MCG tablet; Take 1 tablet (50 mcg total) by mouth daily.  Dispense: 90 tablet; Refill: 0  Follow Up Instructions:    I discussed the assessment and treatment plan with the patient. The patient was provided an opportunity to ask questions and all were answered. The patient agreed with the plan and demonstrated an understanding of the instructions.   The patient was advised to call back or seek an in-person evaluation if the symptoms worsen or if the condition fails to improve as anticipated.  I provided 30 minutes of non-face-to-face time during this encounter.   Ann Held, DO

## 2019-01-04 ENCOUNTER — Telehealth: Payer: Self-pay | Admitting: Family Medicine

## 2019-01-04 NOTE — Telephone Encounter (Signed)
SWP about making RTO visit for a 3 mo f/u visit from her virtual visit with Dr. Etter Sjogren on 4/23.  Pt will speak w/her Dtr and call us back to schedule.

## 2019-01-10 DIAGNOSIS — J9601 Acute respiratory failure with hypoxia: Secondary | ICD-10-CM | POA: Diagnosis not present

## 2019-01-10 DIAGNOSIS — J449 Chronic obstructive pulmonary disease, unspecified: Secondary | ICD-10-CM | POA: Diagnosis not present

## 2019-01-18 DIAGNOSIS — J439 Emphysema, unspecified: Secondary | ICD-10-CM | POA: Diagnosis not present

## 2019-01-18 DIAGNOSIS — J9601 Acute respiratory failure with hypoxia: Secondary | ICD-10-CM | POA: Diagnosis not present

## 2019-01-18 DIAGNOSIS — J449 Chronic obstructive pulmonary disease, unspecified: Secondary | ICD-10-CM | POA: Diagnosis not present

## 2019-02-10 DIAGNOSIS — J9601 Acute respiratory failure with hypoxia: Secondary | ICD-10-CM | POA: Diagnosis not present

## 2019-02-10 DIAGNOSIS — J449 Chronic obstructive pulmonary disease, unspecified: Secondary | ICD-10-CM | POA: Diagnosis not present

## 2019-02-18 DIAGNOSIS — J449 Chronic obstructive pulmonary disease, unspecified: Secondary | ICD-10-CM | POA: Diagnosis not present

## 2019-02-18 DIAGNOSIS — J9601 Acute respiratory failure with hypoxia: Secondary | ICD-10-CM | POA: Diagnosis not present

## 2019-02-18 DIAGNOSIS — J439 Emphysema, unspecified: Secondary | ICD-10-CM | POA: Diagnosis not present

## 2019-02-24 ENCOUNTER — Observation Stay (HOSPITAL_COMMUNITY)
Admission: EM | Admit: 2019-02-24 | Discharge: 2019-02-26 | Disposition: A | Discharge status: Home / Self Care | Payer: Medicare HMO | Attending: Internal Medicine | Admitting: Internal Medicine

## 2019-02-24 ENCOUNTER — Other Ambulatory Visit: Payer: Self-pay

## 2019-02-24 ENCOUNTER — Emergency Department (HOSPITAL_COMMUNITY): Payer: Medicare HMO

## 2019-02-24 ENCOUNTER — Encounter (HOSPITAL_COMMUNITY): Payer: Self-pay

## 2019-02-24 DIAGNOSIS — Z818 Family history of other mental and behavioral disorders: Secondary | ICD-10-CM | POA: Insufficient documentation

## 2019-02-24 DIAGNOSIS — E039 Hypothyroidism, unspecified: Secondary | ICD-10-CM | POA: Diagnosis present

## 2019-02-24 DIAGNOSIS — Z7951 Long term (current) use of inhaled steroids: Secondary | ICD-10-CM | POA: Insufficient documentation

## 2019-02-24 DIAGNOSIS — W010XXA Fall on same level from slipping, tripping and stumbling without subsequent striking against object, initial encounter: Secondary | ICD-10-CM | POA: Insufficient documentation

## 2019-02-24 DIAGNOSIS — Z8673 Personal history of transient ischemic attack (TIA), and cerebral infarction without residual deficits: Secondary | ICD-10-CM | POA: Insufficient documentation

## 2019-02-24 DIAGNOSIS — S92352A Displaced fracture of fifth metatarsal bone, left foot, initial encounter for closed fracture: Secondary | ICD-10-CM | POA: Insufficient documentation

## 2019-02-24 DIAGNOSIS — Z1159 Encounter for screening for other viral diseases: Secondary | ICD-10-CM | POA: Diagnosis not present

## 2019-02-24 DIAGNOSIS — M1712 Unilateral primary osteoarthritis, left knee: Secondary | ICD-10-CM | POA: Insufficient documentation

## 2019-02-24 DIAGNOSIS — M6281 Muscle weakness (generalized): Secondary | ICD-10-CM | POA: Diagnosis not present

## 2019-02-24 DIAGNOSIS — I129 Hypertensive chronic kidney disease with stage 1 through stage 4 chronic kidney disease, or unspecified chronic kidney disease: Secondary | ICD-10-CM | POA: Diagnosis not present

## 2019-02-24 DIAGNOSIS — F329 Major depressive disorder, single episode, unspecified: Secondary | ICD-10-CM | POA: Diagnosis not present

## 2019-02-24 DIAGNOSIS — E1165 Type 2 diabetes mellitus with hyperglycemia: Secondary | ICD-10-CM | POA: Diagnosis not present

## 2019-02-24 DIAGNOSIS — I10 Essential (primary) hypertension: Secondary | ICD-10-CM | POA: Diagnosis present

## 2019-02-24 DIAGNOSIS — Z833 Family history of diabetes mellitus: Secondary | ICD-10-CM | POA: Insufficient documentation

## 2019-02-24 DIAGNOSIS — S82202A Unspecified fracture of shaft of left tibia, initial encounter for closed fracture: Secondary | ICD-10-CM | POA: Diagnosis not present

## 2019-02-24 DIAGNOSIS — N184 Chronic kidney disease, stage 4 (severe): Secondary | ICD-10-CM | POA: Diagnosis not present

## 2019-02-24 DIAGNOSIS — Z87891 Personal history of nicotine dependence: Secondary | ICD-10-CM | POA: Insufficient documentation

## 2019-02-24 DIAGNOSIS — W19XXXA Unspecified fall, initial encounter: Secondary | ICD-10-CM | POA: Diagnosis not present

## 2019-02-24 DIAGNOSIS — Z888 Allergy status to other drugs, medicaments and biological substances status: Secondary | ICD-10-CM | POA: Insufficient documentation

## 2019-02-24 DIAGNOSIS — R2681 Unsteadiness on feet: Secondary | ICD-10-CM | POA: Diagnosis not present

## 2019-02-24 DIAGNOSIS — S92342A Displaced fracture of fourth metatarsal bone, left foot, initial encounter for closed fracture: Secondary | ICD-10-CM | POA: Diagnosis not present

## 2019-02-24 DIAGNOSIS — Z9981 Dependence on supplemental oxygen: Secondary | ICD-10-CM | POA: Insufficient documentation

## 2019-02-24 DIAGNOSIS — B182 Chronic viral hepatitis C: Secondary | ICD-10-CM | POA: Diagnosis not present

## 2019-02-24 DIAGNOSIS — Z6841 Body Mass Index (BMI) 40.0 and over, adult: Secondary | ICD-10-CM | POA: Insufficient documentation

## 2019-02-24 DIAGNOSIS — S82142A Displaced bicondylar fracture of left tibia, initial encounter for closed fracture: Secondary | ICD-10-CM | POA: Diagnosis not present

## 2019-02-24 DIAGNOSIS — E1122 Type 2 diabetes mellitus with diabetic chronic kidney disease: Secondary | ICD-10-CM | POA: Diagnosis not present

## 2019-02-24 DIAGNOSIS — J439 Emphysema, unspecified: Secondary | ICD-10-CM | POA: Diagnosis not present

## 2019-02-24 DIAGNOSIS — Z03818 Encounter for observation for suspected exposure to other biological agents ruled out: Secondary | ICD-10-CM | POA: Diagnosis not present

## 2019-02-24 DIAGNOSIS — S83242A Other tear of medial meniscus, current injury, left knee, initial encounter: Secondary | ICD-10-CM | POA: Insufficient documentation

## 2019-02-24 DIAGNOSIS — R739 Hyperglycemia, unspecified: Secondary | ICD-10-CM | POA: Diagnosis present

## 2019-02-24 DIAGNOSIS — R296 Repeated falls: Secondary | ICD-10-CM | POA: Diagnosis not present

## 2019-02-24 DIAGNOSIS — K219 Gastro-esophageal reflux disease without esophagitis: Secondary | ICD-10-CM | POA: Diagnosis not present

## 2019-02-24 DIAGNOSIS — J9610 Chronic respiratory failure, unspecified whether with hypoxia or hypercapnia: Secondary | ICD-10-CM | POA: Diagnosis present

## 2019-02-24 DIAGNOSIS — Z79899 Other long term (current) drug therapy: Secondary | ICD-10-CM | POA: Insufficient documentation

## 2019-02-24 DIAGNOSIS — Z794 Long term (current) use of insulin: Secondary | ICD-10-CM | POA: Insufficient documentation

## 2019-02-24 DIAGNOSIS — G4700 Insomnia, unspecified: Secondary | ICD-10-CM | POA: Insufficient documentation

## 2019-02-24 DIAGNOSIS — Z8249 Family history of ischemic heart disease and other diseases of the circulatory system: Secondary | ICD-10-CM | POA: Insufficient documentation

## 2019-02-24 DIAGNOSIS — Z7982 Long term (current) use of aspirin: Secondary | ICD-10-CM | POA: Insufficient documentation

## 2019-02-24 DIAGNOSIS — S82832A Other fracture of upper and lower end of left fibula, initial encounter for closed fracture: Secondary | ICD-10-CM | POA: Diagnosis not present

## 2019-02-24 DIAGNOSIS — S8265XA Nondisplaced fracture of lateral malleolus of left fibula, initial encounter for closed fracture: Secondary | ICD-10-CM | POA: Diagnosis not present

## 2019-02-24 DIAGNOSIS — Z79890 Hormone replacement therapy (postmenopausal): Secondary | ICD-10-CM | POA: Insufficient documentation

## 2019-02-24 LAB — CBC WITH DIFFERENTIAL/PLATELET
Abs Immature Granulocytes: 0.02 10*3/uL (ref 0.00–0.07)
Basophils Absolute: 0 10*3/uL (ref 0.0–0.1)
Basophils Relative: 0 %
Eosinophils Absolute: 0.2 10*3/uL (ref 0.0–0.5)
Eosinophils Relative: 3 %
HCT: 35.9 % — ABNORMAL LOW (ref 36.0–46.0)
Hemoglobin: 11.1 g/dL — ABNORMAL LOW (ref 12.0–15.0)
Immature Granulocytes: 0 %
Lymphocytes Relative: 33 %
Lymphs Abs: 2.3 10*3/uL (ref 0.7–4.0)
MCH: 31.9 pg (ref 26.0–34.0)
MCHC: 30.9 g/dL (ref 30.0–36.0)
MCV: 103.2 fL — ABNORMAL HIGH (ref 80.0–100.0)
Monocytes Absolute: 0.5 10*3/uL (ref 0.1–1.0)
Monocytes Relative: 7 %
Neutro Abs: 4.1 10*3/uL (ref 1.7–7.7)
Neutrophils Relative %: 57 %
Platelets: 189 10*3/uL (ref 150–400)
RBC: 3.48 MIL/uL — ABNORMAL LOW (ref 3.87–5.11)
RDW: 13.7 % (ref 11.5–15.5)
WBC: 7.2 10*3/uL (ref 4.0–10.5)
nRBC: 0 % (ref 0.0–0.2)

## 2019-02-24 MED ORDER — FENTANYL CITRATE (PF) 100 MCG/2ML IJ SOLN
50.0000 ug | Freq: Once | INTRAMUSCULAR | Status: AC
  Administered 2019-02-24: 50 ug via INTRAVENOUS
  Filled 2019-02-24: qty 2

## 2019-02-24 MED ORDER — ACETAMINOPHEN 500 MG PO TABS
1000.0000 mg | ORAL_TABLET | Freq: Once | ORAL | Status: DC
  Filled 2019-02-24: qty 2

## 2019-02-24 MED ORDER — TRAMADOL HCL 50 MG PO TABS
50.0000 mg | ORAL_TABLET | Freq: Once | ORAL | Status: AC
  Administered 2019-02-24: 50 mg via ORAL
  Filled 2019-02-24: qty 1

## 2019-02-24 NOTE — ED Provider Notes (Signed)
[facility] DEPT Provider Note   CSN: [id number] Arrival date & time: 02/24/19  2046    History   Chief Complaint Chief Complaint  Patient presents with  . Fall    HPI Sue [name] is a 68 y.o. female.     HPI Patient states that she tripped over her right foot falling backwards onto her left knee.  This happened roughly 1 hour prior to presentation.  She has not been able to weight-bear since.  She complains of pain to the left knee left tib-fib and left foot.  She is unable to range her left knee.  Denies head or neck injury. Past Medical History:  Diagnosis Date  . Allergic rhinitis   . Depression   . Emphysema of lung ([place])   . GERD (gastroesophageal reflux disease)   . Hypertension   . Hypothyroidism   . Stroke [facility]) 2016   TIA   . Urine incontinence     Patient Active Problem List   Diagnosis Date Noted  . Hyperglycemia 08/16/2018  . ARF (acute renal failure) ([place]) 08/16/2018  . AKI (acute kidney injury) ([place]) 08/15/2018  . Morbid obesity due to excess calories ([place]) 05/09/2018  . Acute bronchitis with COPD ([place]) 10/19/2017  . Atypical chest pain 05/01/2017  . Pulmonary emphysema ([place]) 04/06/2017  . Chronic seasonal allergic rhinitis 04/06/2017  . GERD (gastroesophageal reflux disease) 04/06/2017  . Snoring 04/06/2017  . Myalgia 02/01/2017  . Arthralgia 02/01/2017  . Chronic pain of toe of left foot 11/14/2016  . Chronic hepatitis C without hepatic coma ([place]) 08/01/2016  . Hypothyroidism 10/29/2015  . Mild diastolic dysfunction 76/73/4193  . Edema 01/01/2015  . Lower extremity edema 12/24/2014  . TIA (transient ischemic attack) 07/30/2014  . Weakness 07/29/2014  . Flank pain 11/12/2013  . Chronic respiratory failure ([place]) 10/25/2013  . DOE (dyspnea on exertion) 10/25/2013  . Obesity (BMI 30-39.9) 10/15/2013  . Depression with anxiety 10/15/2013  . Insomnia 10/15/2013  . HTN (hypertension) 10/09/2013  . Hypokalemia  10/09/2013    Past Surgical History:  Procedure Laterality Date  . ABDOMINAL HYSTERECTOMY    . CESAREAN SECTION       OB History   No obstetric history on file.      Home Medications    Prior to Admission medications   Medication Sig Start Date End Date Taking? Authorizing Provider  albuterol (PROVENTIL HFA;VENTOLIN HFA) 108 (90 Base) MCG/ACT inhaler Inhale 1-2 puffs into the lungs every 6 (six) hours as needed for wheezing or shortness of breath.    [provider]  albuterol (PROVENTIL) (2.5 MG/3ML) 0.083% nebulizer solution Take 3 mLs (2.5 mg total) by nebulization every 6 (six) hours as needed for wheezing or shortness of breath. 03/31/17   Carollee Herter, Alferd Apa, DO  aspirin EC 81 MG EC tablet Take 1 tablet (81 mg total) by mouth daily. 05/03/17   Johnson, Clanford L, MD  blood glucose meter kit and supplies KIT Dispense based on patient and insurance preference. Use up to four times daily as directed. (FOR ICD-9 250.00, 250.01). 10/04/18   Carollee Herter, Alferd Apa, DO  busPIRone (BUSPAR) 15 MG tablet Take 22 mg by mouth daily.  12/27/17   [provider]  fluticasone (FLONASE) 50 MCG/ACT nasal spray Place 2 sprays into both nostrils daily as needed for allergies. 05/02/17   Johnson, Clanford L, MD  fluticasone (FLOVENT HFA) 110 MCG/ACT inhaler Inhale 2 puffs into the lungs 2 (two) times daily. 08/18/18 08/18/19  Danford, Suann Larry, MD  glipiZIDE (GLUCOTROL) 5 MG tablet Take 0.5 tablets (2.5 mg total) by mouth 2 (two) times daily before a meal. 08/18/18 08/18/19  Danford, Suann Larry, MD  insulin glargine (LANTUS) 100 UNIT/ML injection Inject 0.3 mLs (30 Units total) into the skin at bedtime. 12/06/18   Ann Held, DO  Insulin Syringe-Needle U-100 28G X 5/16" 0.5 ML MISC Use with lantus once a day 12/06/18   Carollee Herter, Alferd Apa, DO  levothyroxine (SYNTHROID) 50 MCG tablet Take 1 tablet (50 mcg total) by mouth daily. 01/03/19   Ann Held, DO   lisinopril (ZESTRIL) 20 MG tablet Take 1 tablet by mouth once daily 01/03/19   Burtis Junes, NP  lisinopril (ZESTRIL) 20 MG tablet Take 1 tablet (20 mg total) by mouth daily. 01/03/19   Roma Schanz R, DO  LORazepam (ATIVAN) 2 MG tablet Take 0.5-1 mg by mouth as directed. 0.5 Mg in the afternoon and 1 MG at bedtime 01/31/17   Carollee Herter, Alferd Apa, DO  metFORMIN (GLUCOPHAGE) 500 MG tablet Take 2 tablets (1,000 mg total) by mouth 2 (two) times daily with a meal. 08/18/18 08/18/19  [place]ford, Suann Larry, MD  metoprolol succinate (TOPROL-XL) 50 MG 24 hr tablet TAKE 2 TABLETS BY MOUTH ONCE DAILY IN THE MORNING WITH A MEAL OR IMMEDIATELY FOLLOWING A MEAL 01/03/19   Roma Schanz R, DO  OXYGEN Inhale 3 L into the lungs continuous.     [provider]  potassium chloride SA (K-DUR) 20 MEQ tablet Take 1 tablet (20 mEq total) by mouth daily. 01/03/19   Ann Held, DO  potassium chloride SA (K-DUR,KLOR-CON) 20 MEQ tablet Take 1 tablet (20 mEq total) by mouth daily. 08/30/18   Ann Held, DO  ranitidine (ZANTAC) 150 MG tablet TAKE 1 TABLET BY MOUTH ONCE DAILY AT BEDTIME 02/08/18   Tanda Rockers, MD  torsemide (DEMADEX) 20 MG tablet Take 1 tablet (20 mg total) by mouth daily. 01/03/19 04/03/19  Ann Held, DO  zolpidem (AMBIEN) 5 MG tablet Take 2 tablets (10 mg total) by mouth at bedtime. 05/02/17   Murlean Iba, MD    Family History Family History  Problem Relation Age of Onset  . Heart disease Father        MVP and Pics Valve  . Hypertension Father   . Depression Father        Institutionalized x's 2 years  . Bipolar disorder Father   . Hypertension Sister   . Diabetes Sister   . Hyperlipidemia Sister   . Heart disease Sister 61       MI  . Heart disease Brother   . Hypertension Brother   . Heart disease Paternal Grandmother   . Heart disease Paternal Aunt   . Heart disease Paternal Uncle   . Schizophrenia Paternal Aunt   . Asthma Son    . Asthma Son     Social History Social History   Tobacco Use  . Smoking status: Former Smoker    Packs/day: 1.00    Years: 40.00    Pack years: 40.00    Types: Cigarettes    Start date: 07/21/1972    Quit date: 10/16/2011    Years since quitting: 7.3  . Smokeless tobacco: Never Used  . Tobacco comment: stopped for around 9 months during 1 pregnancy.  Substance Use Topics  . Alcohol use: Yes    Comment: Occ-- Wine  .  Drug use: No     Allergies   Norvasc [amlodipine besylate]   Review of Systems Review of Systems  Constitutional: Negative for chills and fever.  Respiratory: Negative for shortness of breath.   Cardiovascular: Negative for chest pain.  Gastrointestinal: Negative for abdominal pain, diarrhea, nausea and vomiting.  Musculoskeletal: Positive for arthralgias and myalgias. Negative for back pain.  Skin: Negative for rash and wound.  Neurological: Negative for dizziness, weakness, light-headedness, numbness and headaches.  All other systems reviewed and are negative.    Physical Exam Updated Vital Signs BP 132/78 (BP Location: Left Arm)   Pulse 80   Temp 97.8 F (36.6 C) (Oral)   Resp 18   SpO2 100%   Physical Exam Vitals signs and nursing note reviewed.  Constitutional:      Appearance: She is well-developed.  HENT:     Head: Normocephalic and atraumatic.     Nose: Nose normal.     Mouth/Throat:     Mouth: Mucous membranes are moist.  Eyes:     Extraocular Movements: Extraocular movements intact.     Pupils: Pupils are equal, round, and reactive to light.  Neck:     Musculoskeletal: Normal range of motion and neck supple. No neck rigidity or muscular tenderness.  Cardiovascular:     Rate and Rhythm: Normal rate and regular rhythm.     Heart sounds: No murmur. No friction rub. No gallop.   Pulmonary:     Effort: Pulmonary effort is normal. No respiratory distress.     Breath sounds: Normal breath sounds. No stridor. No wheezing, rhonchi or  rales.  Chest:     Chest wall: No tenderness.  Abdominal:     General: Bowel sounds are normal.     Palpations: Abdomen is soft.     Tenderness: There is no abdominal tenderness. There is no guarding or rebound.  Musculoskeletal: Normal range of motion.        General: Tenderness present.     Comments: Patient with tenderness to palpation over the lateral surface of the left knee and over lateral lower leg extending to the left ankle.  Patient has swelling and tenderness to palpation of the dorsum of the left foot especially over the lateral surface.  She is unable to flex the left knee.  Distal pulses intact.  Lymphadenopathy:     Cervical: No cervical adenopathy.  Skin:    General: Skin is warm and dry.     Findings: No erythema or rash.  Neurological:     General: No focal deficit present.     Mental Status: She is alert and oriented to person, place, and time.     Comments: Left lower extremity motor examination limited due to pain.  No obvious deficit.  Sensation intact.  Psychiatric:        Behavior: Behavior normal.      ED Treatments / Results  Labs (all labs ordered are listed, but only abnormal results are displayed) Labs Reviewed  SARS CORONAVIRUS 2 (HOSPITAL ORDER, [facility])  CBC WITH DIFFERENTIAL/PLATELET  COMPREHENSIVE METABOLIC PANEL    EKG None  Radiology Dg Tibia/fibula Left  Result Date: 02/24/2019 CLINICAL DATA:  Per ems: Pt coming from home c/o fall which resulted in her "left leg getting twisted under her." Pt unable to bend knee but no other deformities noted. EXAM: LEFT TIBIA AND FIBULA - 2 VIEW COMPARISON:  None. FINDINGS: There are nondisplaced fractures of the lateral margin of the  lateral tibial plateau and of the proximal fibula as detailed under the left knee radiographs. No other fractures.  Knee and ankle joints are normally aligned. Mild diffuse ankle soft tissue edema. IMPRESSION: 1. Nondisplaced fractures the  lateral margin of the lateral tibial plateau and the proximal fibula. 2. No other fractures.  No dislocation. Electronically Signed   By: Lajean Manes M.D.   On: 02/24/2019 22:18   Dg Knee Complete 4 Views Left  Result Date: 02/24/2019 CLINICAL DATA:  Per ems: Pt coming from home c/o fall which resulted in her "left leg getting twisted under her." Pt unable to bend knee but no other deformities noted. EXAM: LEFT KNEE - COMPLETE 4+ VIEW COMPARISON:  None. FINDINGS: There is fracture of the proximal fibula extending from the fibular head to the medial proximal shaft, nondisplaced. There is another nondisplaced fracture, from the lateral margin of the lateral tibial plateau, not involving the articular surface. No other fractures. There is medial compartment joint space narrowing with marginal osteophytes from all 3 compartments. Mild lateral soft tissue edema is noted. IMPRESSION: 1. Nondisplaced fracture of the proximal fibula in from the lateral margin of the lateral tibial plateau. 2. No other acute abnormality. 3. Moderate osteoarthritis. Electronically Signed   By: Lajean Manes M.D.   On: 02/24/2019 22:17   Dg Foot Complete Left  Result Date: 02/24/2019 CLINICAL DATA:  Per ems: Pt coming from home c/o fall which resulted in her "left leg getting twisted under her." Pt unable to bend knee but no other deformities noted. EXAM: LEFT FOOT - COMPLETE 3+ VIEW COMPARISON:  None. FINDINGS: There are fractures of the fourth and fifth metatarsals. No significant fracture displacement. Fractures extend from the shafts to the bases of the metatarsal heads. No other fractures.  The joints are normally aligned. There is forefoot soft tissue swelling. IMPRESSION: 1. Fractures of the mid to distal fourth and fifth metatarsals without significant displacement or angulation. No dislocation. Electronically Signed   By: Lajean Manes M.D.   On: 02/24/2019 22:20    Procedures Procedures (including critical care time)   Medications Ordered in ED Medications  acetaminophen (TYLENOL) tablet 1,000 mg (1,000 mg Oral Refused 02/24/19 2158)  traMADol (ULTRAM) tablet 50 mg (50 mg Oral Given 02/24/19 2157)     Initial Impression / Assessment and Plan / ED Course  I have reviewed the triage vital signs and the nursing notes.  Pertinent labs & imaging results that were available during my care of the patient were reviewed by me and considered in my medical decision making (see chart for details).        Patient with left lateral tibial plateau and left proximal fibular fractures as well as fractures of the left fourth and fifth metatarsals.  Will discuss with orthopedics. Dr. Lyla Glassing recommends MRI of the knee to rule out multi-ligamentous injury.  Will arrange transfer to [facility] emergency department for MRI. Final Clinical Impressions(s) / ED Diagnoses   Final diagnoses:  None    ED Discharge Orders    None       Julianne Rice, MD 02/24/19 2314

## 2019-02-24 NOTE — ED Notes (Signed)
Pt refused to put on hospital gown

## 2019-02-24 NOTE — ED Notes (Signed)
Patient transported to X-ray 

## 2019-02-24 NOTE — ED Notes (Signed)
Bed: WA17 Expected date:  Expected time:  Means of arrival:  Comments: 68 yo F/ Fall

## 2019-02-24 NOTE — ED Provider Notes (Signed)
Blood pressure 132/78, pulse 80, temperature 97.8 F (36.6 C), temperature source Oral, resp. rate 18, SpO2 100 %.  Assuming care from Dr. Lita Mains.  In short, Sue [name] is a 68 y.o. female with a chief complaint of Fall .  Refer to the original H&P for additional details.  11:13 PM  Spoke with Dr. Stark Jock regarding knee MRI. Dr. Lyla Glassing requesting this tonight. Dr. Stark Jock accepts in transfer to [facility] ED for MRI. Patient may require admit to hospitalist for pain control but will depend on MRI result.    Dr. Stark Jock accepting patient at [facility] for MRI tonight.     Margette Fast, MD 02/25/19 [phone]

## 2019-02-24 NOTE — ED Triage Notes (Signed)
Per ems: Pt coming from home c/o fall which resulted in her "left leg getting twisted under her." Pt unable to bend knee but no other deformities noted.

## 2019-02-25 ENCOUNTER — Encounter (HOSPITAL_COMMUNITY): Payer: Self-pay | Admitting: Internal Medicine

## 2019-02-25 ENCOUNTER — Emergency Department (HOSPITAL_COMMUNITY): Payer: Medicare HMO

## 2019-02-25 DIAGNOSIS — E1165 Type 2 diabetes mellitus with hyperglycemia: Secondary | ICD-10-CM | POA: Diagnosis not present

## 2019-02-25 DIAGNOSIS — S82832A Other fracture of upper and lower end of left fibula, initial encounter for closed fracture: Secondary | ICD-10-CM | POA: Diagnosis not present

## 2019-02-25 DIAGNOSIS — S83242A Other tear of medial meniscus, current injury, left knee, initial encounter: Secondary | ICD-10-CM | POA: Diagnosis not present

## 2019-02-25 DIAGNOSIS — E039 Hypothyroidism, unspecified: Secondary | ICD-10-CM | POA: Diagnosis not present

## 2019-02-25 DIAGNOSIS — G4700 Insomnia, unspecified: Secondary | ICD-10-CM | POA: Diagnosis not present

## 2019-02-25 DIAGNOSIS — F329 Major depressive disorder, single episode, unspecified: Secondary | ICD-10-CM | POA: Diagnosis not present

## 2019-02-25 DIAGNOSIS — J9611 Chronic respiratory failure with hypoxia: Secondary | ICD-10-CM | POA: Diagnosis not present

## 2019-02-25 DIAGNOSIS — N184 Chronic kidney disease, stage 4 (severe): Secondary | ICD-10-CM | POA: Diagnosis not present

## 2019-02-25 DIAGNOSIS — S82142A Displaced bicondylar fracture of left tibia, initial encounter for closed fracture: Secondary | ICD-10-CM | POA: Diagnosis present

## 2019-02-25 DIAGNOSIS — J439 Emphysema, unspecified: Secondary | ICD-10-CM | POA: Diagnosis not present

## 2019-02-25 DIAGNOSIS — M6281 Muscle weakness (generalized): Secondary | ICD-10-CM | POA: Diagnosis not present

## 2019-02-25 DIAGNOSIS — Z1159 Encounter for screening for other viral diseases: Secondary | ICD-10-CM | POA: Diagnosis not present

## 2019-02-25 DIAGNOSIS — I129 Hypertensive chronic kidney disease with stage 1 through stage 4 chronic kidney disease, or unspecified chronic kidney disease: Secondary | ICD-10-CM | POA: Diagnosis not present

## 2019-02-25 DIAGNOSIS — Z8673 Personal history of transient ischemic attack (TIA), and cerebral infarction without residual deficits: Secondary | ICD-10-CM | POA: Diagnosis not present

## 2019-02-25 DIAGNOSIS — I10 Essential (primary) hypertension: Secondary | ICD-10-CM

## 2019-02-25 DIAGNOSIS — K219 Gastro-esophageal reflux disease without esophagitis: Secondary | ICD-10-CM | POA: Diagnosis not present

## 2019-02-25 DIAGNOSIS — R296 Repeated falls: Secondary | ICD-10-CM | POA: Diagnosis not present

## 2019-02-25 DIAGNOSIS — M1712 Unilateral primary osteoarthritis, left knee: Secondary | ICD-10-CM | POA: Diagnosis not present

## 2019-02-25 DIAGNOSIS — R2681 Unsteadiness on feet: Secondary | ICD-10-CM | POA: Diagnosis not present

## 2019-02-25 DIAGNOSIS — Z6841 Body Mass Index (BMI) 40.0 and over, adult: Secondary | ICD-10-CM | POA: Diagnosis not present

## 2019-02-25 DIAGNOSIS — S92342A Displaced fracture of fourth metatarsal bone, left foot, initial encounter for closed fracture: Secondary | ICD-10-CM | POA: Diagnosis not present

## 2019-02-25 DIAGNOSIS — S82202A Unspecified fracture of shaft of left tibia, initial encounter for closed fracture: Secondary | ICD-10-CM | POA: Diagnosis present

## 2019-02-25 DIAGNOSIS — S92352A Displaced fracture of fifth metatarsal bone, left foot, initial encounter for closed fracture: Secondary | ICD-10-CM | POA: Diagnosis not present

## 2019-02-25 DIAGNOSIS — J9610 Chronic respiratory failure, unspecified whether with hypoxia or hypercapnia: Secondary | ICD-10-CM | POA: Diagnosis not present

## 2019-02-25 DIAGNOSIS — S82102A Unspecified fracture of upper end of left tibia, initial encounter for closed fracture: Secondary | ICD-10-CM | POA: Diagnosis not present

## 2019-02-25 DIAGNOSIS — W010XXA Fall on same level from slipping, tripping and stumbling without subsequent striking against object, initial encounter: Secondary | ICD-10-CM | POA: Diagnosis not present

## 2019-02-25 DIAGNOSIS — E1122 Type 2 diabetes mellitus with diabetic chronic kidney disease: Secondary | ICD-10-CM | POA: Diagnosis not present

## 2019-02-25 DIAGNOSIS — B182 Chronic viral hepatitis C: Secondary | ICD-10-CM | POA: Diagnosis not present

## 2019-02-25 LAB — COMPREHENSIVE METABOLIC PANEL
ALT: 14 U/L (ref 0–44)
AST: 21 U/L (ref 15–41)
Albumin: 3.4 g/dL — ABNORMAL LOW (ref 3.5–5.0)
Alkaline Phosphatase: 96 U/L (ref 38–126)
Anion gap: 8 (ref 5–15)
BUN: 20 mg/dL (ref 8–23)
CO2: 29 mmol/L (ref 22–32)
Calcium: 8.3 mg/dL — ABNORMAL LOW (ref 8.9–10.3)
Chloride: 101 mmol/L (ref 98–111)
Creatinine, Ser: 1.73 mg/dL — ABNORMAL HIGH (ref 0.44–1.00)
GFR calc Af Amer: 35 mL/min — ABNORMAL LOW (ref >60)
GFR calc non Af Amer: 30 mL/min — ABNORMAL LOW (ref >60)
Glucose, Bld: 168 mg/dL — ABNORMAL HIGH (ref 70–99)
Potassium: 4.5 mmol/L (ref 3.5–5.1)
Sodium: 138 mmol/L (ref 135–145)
Total Bilirubin: 0.2 mg/dL — ABNORMAL LOW (ref 0.3–1.2)
Total Protein: 8.3 g/dL — ABNORMAL HIGH (ref 6.5–8.1)

## 2019-02-25 LAB — GLUCOSE, CAPILLARY
Glucose-Capillary: 151 mg/dL — ABNORMAL HIGH (ref 70–99)
Glucose-Capillary: 216 mg/dL — ABNORMAL HIGH (ref 70–99)
Glucose-Capillary: 170 mg/dL — ABNORMAL HIGH (ref 70–99)

## 2019-02-25 LAB — HEMOGLOBIN A1C
Hgb A1c MFr Bld: 6.7 % — ABNORMAL HIGH (ref 4.8–5.6)
Mean Plasma Glucose: 145.59 mg/dL

## 2019-02-25 LAB — T4, FREE: Free T4: 0.99 ng/dL (ref 0.61–1.12)

## 2019-02-25 LAB — TSH: TSH: 7.383 u[IU]/mL — ABNORMAL HIGH (ref 0.350–4.500)

## 2019-02-25 LAB — SARS CORONAVIRUS 2 BY RT PCR (HOSPITAL ORDER, PERFORMED IN ~~LOC~~ HOSPITAL LAB): SARS Coronavirus 2: NEGATIVE

## 2019-02-25 MED ORDER — ALBUTEROL SULFATE (2.5 MG/3ML) 0.083% IN NEBU: 3.0000 mL | INHALATION_SOLUTION | Freq: Four times a day (QID) | RESPIRATORY_TRACT | Status: DC | PRN

## 2019-02-25 MED ORDER — DOCUSATE SODIUM 100 MG PO CAPS
100.0000 mg | ORAL_CAPSULE | Freq: Two times a day (BID) | ORAL | Status: DC
  Administered 2019-02-25 – 2019-02-26 (×3): 100 mg via ORAL
  Filled 2019-02-25 (×3): qty 1

## 2019-02-25 MED ORDER — METOPROLOL SUCCINATE ER 25 MG PO TB24
50.0000 mg | ORAL_TABLET | Freq: Every day | ORAL | Status: DC
  Administered 2019-02-25 – 2019-02-26 (×2): 50 mg via ORAL
  Filled 2019-02-25 (×2): qty 2

## 2019-02-25 MED ORDER — QUETIAPINE FUMARATE 100 MG PO TABS
100.0000 mg | ORAL_TABLET | Freq: Every day | ORAL | Status: DC
  Administered 2019-02-25: 22:00:00 100 mg via ORAL
  Filled 2019-02-25 (×2): qty 1

## 2019-02-25 MED ORDER — ENOXAPARIN SODIUM 40 MG/0.4ML ~~LOC~~ SOLN
40.0000 mg | SUBCUTANEOUS | Status: DC
  Administered 2019-02-25 – 2019-02-26 (×2): 40 mg via SUBCUTANEOUS
  Filled 2019-02-25 (×2): qty 0.4

## 2019-02-25 MED ORDER — INSULIN ASPART 100 UNIT/ML ~~LOC~~ SOLN
0.0000 [IU] | Freq: Three times a day (TID) | SUBCUTANEOUS | Status: DC
  Administered 2019-02-25: 17:00:00 5 [IU] via SUBCUTANEOUS
  Administered 2019-02-25: 13:00:00 3 [IU] via SUBCUTANEOUS
  Administered 2019-02-26 (×2): 2 [IU] via SUBCUTANEOUS

## 2019-02-25 MED ORDER — ZOLPIDEM TARTRATE 5 MG PO TABS
5.0000 mg | ORAL_TABLET | Freq: Every day | ORAL | Status: DC
  Administered 2019-02-25: 22:00:00 5 mg via ORAL
  Filled 2019-02-25: qty 1

## 2019-02-25 MED ORDER — HYDROCODONE-ACETAMINOPHEN 5-325 MG PO TABS
1.0000 | ORAL_TABLET | Freq: Four times a day (QID) | ORAL | Status: DC | PRN
  Administered 2019-02-25 – 2019-02-26 (×3): 2 via ORAL
  Filled 2019-02-25 (×3): qty 2

## 2019-02-25 MED ORDER — INSULIN ASPART 100 UNIT/ML ~~LOC~~ SOLN: 0.0000 [IU] | Freq: Every day | SUBCUTANEOUS | Status: DC

## 2019-02-25 MED ORDER — FAMOTIDINE 20 MG PO TABS: 20.0000 mg | ORAL_TABLET | Freq: Every day | ORAL | Status: DC | PRN

## 2019-02-25 MED ORDER — LORAZEPAM 1 MG PO TABS: 0.5000 mg | ORAL_TABLET | ORAL | Status: DC

## 2019-02-25 MED ORDER — MORPHINE SULFATE (PF) 4 MG/ML IV SOLN
4.0000 mg | Freq: Once | INTRAVENOUS | Status: AC
  Administered 2019-02-25: 06:00:00 4 mg via INTRAVENOUS
  Filled 2019-02-25: qty 1

## 2019-02-25 MED ORDER — POLYETHYLENE GLYCOL 3350 17 G PO PACK: 17.0000 g | PACK | Freq: Every day | ORAL | Status: DC | PRN

## 2019-02-25 MED ORDER — MORPHINE SULFATE (PF) 2 MG/ML IV SOLN
0.5000 mg | INTRAVENOUS | Status: DC | PRN
  Administered 2019-02-25: 0.5 mg via INTRAVENOUS
  Filled 2019-02-25: qty 1

## 2019-02-25 MED ORDER — BISACODYL 5 MG PO TBEC
5.0000 mg | DELAYED_RELEASE_TABLET | Freq: Every day | ORAL | Status: DC | PRN
  Administered 2019-02-26: 12:00:00 5 mg via ORAL
  Filled 2019-02-25: qty 1

## 2019-02-25 MED ORDER — METHOCARBAMOL 1000 MG/10ML IJ SOLN
500.0000 mg | Freq: Four times a day (QID) | INTRAVENOUS | Status: DC | PRN
  Filled 2019-02-25: qty 5

## 2019-02-25 MED ORDER — METHOCARBAMOL 500 MG PO TABS
500.0000 mg | ORAL_TABLET | Freq: Four times a day (QID) | ORAL | Status: DC | PRN
  Administered 2019-02-25: 12:00:00 500 mg via ORAL
  Filled 2019-02-25: qty 1

## 2019-02-25 MED ORDER — ASPIRIN 81 MG PO TBEC: 81.0000 mg | DELAYED_RELEASE_TABLET | Freq: Every day | ORAL | Status: DC

## 2019-02-25 MED ORDER — ASPIRIN EC 81 MG PO TBEC
81.0000 mg | DELAYED_RELEASE_TABLET | Freq: Every day | ORAL | Status: DC
  Administered 2019-02-25 – 2019-02-26 (×2): 81 mg via ORAL
  Filled 2019-02-25 (×2): qty 1

## 2019-02-25 MED ORDER — LEVOTHYROXINE SODIUM 50 MCG PO TABS
50.0000 ug | ORAL_TABLET | Freq: Every day | ORAL | Status: DC
  Administered 2019-02-26: 06:00:00 50 ug via ORAL
  Filled 2019-02-25: qty 1

## 2019-02-25 MED ORDER — LORAZEPAM 0.5 MG PO TABS
0.5000 mg | ORAL_TABLET | ORAL | Status: DC
  Administered 2019-02-25: 0.5 mg via ORAL
  Filled 2019-02-25: qty 1

## 2019-02-25 MED ORDER — LORAZEPAM 1 MG PO TABS
1.0000 mg | ORAL_TABLET | Freq: Every day | ORAL | Status: DC
  Administered 2019-02-25: 22:00:00 1 mg via ORAL
  Filled 2019-02-25: qty 2

## 2019-02-25 MED ORDER — FENTANYL CITRATE (PF) 100 MCG/2ML IJ SOLN
50.0000 ug | Freq: Once | INTRAMUSCULAR | Status: AC | PRN
  Administered 2019-02-25: 04:00:00 50 ug via INTRAVENOUS
  Filled 2019-02-25: qty 2

## 2019-02-25 MED ORDER — BUSPIRONE HCL 15 MG PO TABS
15.0000 mg | ORAL_TABLET | Freq: Two times a day (BID) | ORAL | Status: DC
  Administered 2019-02-25 – 2019-02-26 (×3): 15 mg via ORAL
  Filled 2019-02-25 (×3): qty 1

## 2019-02-25 MED ORDER — INSULIN GLARGINE 100 UNIT/ML ~~LOC~~ SOLN
30.0000 [IU] | Freq: Every day | SUBCUTANEOUS | Status: DC
  Administered 2019-02-25: 22:00:00 30 [IU] via SUBCUTANEOUS
  Filled 2019-02-25 (×2): qty 0.3

## 2019-02-25 NOTE — ED Notes (Signed)
Gave pt Diet Ginger Ale, per [place] - Therapist, sports.

## 2019-02-25 NOTE — ED Notes (Signed)
Pt leaving facility with [facility] and all of her belongings and going to [facility]

## 2019-02-25 NOTE — ED Notes (Signed)
Paged ortho 

## 2019-02-25 NOTE — Progress Notes (Signed)
Inpatient Rehab Admissions:  Inpatient Rehab Consult received.  Note PT recommendations are for SNF and pt is listed as Observation status.  Will sign off at this time.   Shann Medal, PT, DPT Admissions Coordinator [phone] 02/25/19  4:04 PM

## 2019-02-25 NOTE — ED Provider Notes (Signed)
Patient arrived to [facility] ED as transfer from [facility] for MRI of the knee as recommended by orthopedics.  Hemodynamically stable and lower extremities neurovascularly intact upon arrival.  Pain is controlled at this time, but will place as needed order.  She is aware of plan for MRI of the knee.  No other complaints or concerns at this time. I am concerned about her safety at home. I think it is in her best interest to be admitted as she will likely need rehab placement. Spoke with Dr. Lyla Glassing of ortho who is aware that MRI has been done - I was notified by radiology that we do not a radiologist on tonight that can read knee MRI. Hospitalist consulted who will admit.    Ward, Ozella Almond, PA-C 02/25/19 [place], April, MD 02/25/19 [phone]

## 2019-02-25 NOTE — Evaluation (Signed)
Physical Therapy Evaluation Patient Details Name: Sue [name] MRN: [id number] DOB: 1951/05/18 Today's Date: 02/25/2019   History of Present Illness  Patient is a 68 year old female S/P fall and subsequent left fibular head fx and 4th 5th metetarsel fx on 02/24/2019. She is currently WBAT in a knee immobilizaer and a surgical shoe. PMH: Stroke, HTN, depression, morbid obesity, smphysema, o2 use  Clinical Impression  Patient required significant assist for mobility. She required max a+2 for sit to stand transfer. She had difficulty taking small side steps up the edge of the bed. She has 2 steps into her house. She reports she has family assistance but she will require max a transfers. She may need a wheelchair. Even with a wheelchair there will be safety concerns with her getting in and out of it. She would benefit from rehab at a SNF at this time.     Follow Up Recommendations SNF    Equipment Recommendations  Wheelchair (measurements PT);Wheelchair cushion (measurements PT)    Recommendations for Other Services Rehab consult     Precautions / Restrictions Precautions Precautions: Fall Precaution Comments: recent fall at home  Restrictions Weight Bearing Restrictions: Yes LLE Weight Bearing: Weight bearing as tolerated      Mobility  Bed Mobility Overal bed mobility: Needs Assistance Bed Mobility: Sit to Supine;Supine to Sit     Supine to sit: Mod assist Sit to supine: Mod assist   General bed mobility comments: Mod a to get left leg out of bed and to get left leg back into bed. Cuing for hand placement to scoot to the edge of the bed.   Transfers Overall transfer level: Needs assistance Equipment used: Rolling walker (2 wheeled) Transfers: Sit to/from Stand Sit to Stand: Max assist;+2 physical assistance;+2 safety/equipment         General transfer comment: Max a to stand. Patient tried 3x. On the third trial she was bale to come to standing but the patient was still  having trouble putting weight on her leg   Ambulation/Gait Ambulation/Gait assistance: Max assist;+2 physical assistance;+2 safety/equipment Gait Distance (Feet): 2 Feet Assistive device: Rolling walker (2 wheeled) Gait Pattern/deviations: Step-to pattern;Decreased step length - left;Decreased stance time - left Gait velocity: decreased  Gait velocity interpretation: <1.31 ft/sec, indicative of household ambulator General Gait Details: difficulty taking steps towards the head of the bed.   Stairs            Wheelchair Mobility    Modified Rankin (Stroke Patients Only)       Balance                                             Pertinent Vitals/Pain Pain Assessment: Faces Faces Pain Scale: Hurts whole lot Pain Location: left knee and foot  Pain Descriptors / Indicators: Aching;Guarding;Grimacing;Discomfort Pain Intervention(s): Limited activity within patient's tolerance;Monitored during session;Patient requesting pain meds-RN notified    Home Living Family/patient expects to be discharged to:: Private residence Living Arrangements: Alone Available Help at Discharge: Family;Available PRN/intermittently Type of Home: Apartment Home Access: Stairs to enter   Entrance Stairs-Number of Steps: 2 Home Layout: One level Home Equipment: Clinical cytogeneticist - 4 wheels      Prior Function Level of Independence: Needs assistance   Gait / Transfers Assistance Needed: holds onto furniture at home           Hand  Dominance   Dominant Hand: Right    Extremity/Trunk Assessment   Upper Extremity Assessment Upper Extremity Assessment: Defer to OT evaluation    Lower Extremity Assessment Lower Extremity Assessment: LLE deficits/detail LLE: Unable to fully assess due to pain       Communication   Communication: No difficulties  Cognition Arousal/Alertness: Awake/alert Behavior During Therapy: WFL for tasks assessed/performed Overall Cognitive  Status: Within Functional Limits for tasks assessed                                        General Comments      Exercises     Assessment/Plan    PT Assessment Patient needs continued PT services  PT Problem List Decreased strength;Decreased range of motion;Decreased activity tolerance;Decreased balance;Decreased mobility;Decreased knowledge of use of DME;Pain       PT Treatment Interventions DME instruction;Gait training;Stair training;Functional mobility training;Therapeutic activities;Therapeutic exercise;Patient/family education;Neuromuscular re-education    PT Goals (Current goals can be found in the Care Plan section)  Acute Rehab PT Goals Patient Stated Goal: to go home  PT Goal Formulation: With patient Time For Goal Achievement: 03/11/19 Potential to Achieve Goals: Good    Frequency Min 3X/week   Barriers to discharge Inaccessible home environment 2 steps into house. Patient unable to ambualter at this time     Co-evaluation               AM-PAC PT "6 Clicks" Mobility  Outcome Measure Help needed turning from your back to your side while in a flat bed without using bedrails?: A Lot Help needed moving from lying on your back to sitting on the side of a flat bed without using bedrails?: A Lot Help needed moving to and from a bed to a chair (including a wheelchair)?: A Lot Help needed standing up from a chair using your arms (e.g., wheelchair or bedside chair)?: A Lot Help needed to walk in hospital room?: A Lot Help needed climbing 3-5 steps with a railing? : A Lot 6 Click Score: 12    End of Session Equipment Utilized During Treatment: Gait belt Activity Tolerance: Patient limited by pain Patient left: in bed;with call bell/phone within reach;with bed alarm set Nurse Communication: Mobility status PT Visit Diagnosis: Unsteadiness on feet (R26.81);Repeated falls (R29.6);Muscle weakness (generalized) (M62.81)    Time: 8875-7972 PT Time  Calculation (min) (ACUTE ONLY): 25 min   Charges:   PT Evaluation $PT Eval Moderate Complexity: 1 Mod            Carney Living PT DPT  02/25/2019, 3:41 PM

## 2019-02-25 NOTE — Care Management (Signed)
    Durable Medical Equipment  (From admission, onward)         Start     Ordered   02/25/19 1624  For home use only DME lightweight manual wheelchair with seat cushion  Once    Comments: Patient suffers from Left fibular head fracture. Left knee osteoarthritis with sequela. Segond fracture left knee. Left foot metatarsal neck fracture 4 and 5, amenable to conservative treatment. Morbid obesity.which impairs their ability to perform daily activities like ambulating  in the home.  A cane will not resolve  issue with performing activities of daily living. A wheelchair will allow patient to safely perform daily activities. Patient is not able to propel themselves in the home using a standard weight wheelchair due toLeft fibular head fracture. Left knee osteoarthritis with sequela. Segond fracture left knee. Left foot metatarsal neck fracture 4 and 5, amenable to conservative treatment. Morbid obesity.. Patient can self propel in the lightweight wheelchair. Length of need nine months . Accessories: elevating leg rests (ELRs), wheel locks, extensions and anti-tippers.   Seat and back cushion   02/25/19 1624

## 2019-02-25 NOTE — Care Management (Addendum)
    Durable Medical Equipment  (From admission, onward)         Start     Ordered   02/25/19 1637  For home use only DME Hospital bed  Once    Question Answer Comment  Length of Need Lifetime   Patient has (list medical condition): Left fibular head fracture.   The above medical condition requires: Patient requires the ability to reposition frequently   Head must be elevated greater than: 45 degrees   Bed type Semi-electric   Support Surface: Gel Overlay      02/25/19 1637   02/25/19 1624  For home use only DME lightweight manual wheelchair with seat cushion  Once    Comments: Patient suffers from Left fibular head fracture. Left knee osteoarthritis with sequela. Segond fracture left knee. Left foot metatarsal neck fracture 4 and 5, amenable to conservative treatment. Morbid obesity.which impairs their ability to perform daily activities like ambulating  in the home.  A cane will not resolve  issue with performing activities of daily living. A wheelchair will allow patient to safely perform daily activities. Patient is not able to propel themselves in the home using a standard weight wheelchair due toLeft fibular head fracture. Left knee osteoarthritis with sequela. Segond fracture left knee. Left foot metatarsal neck fracture 4 and 5, amenable to conservative treatment. Morbid obesity.. Patient can self propel in the lightweight wheelchair. Length of need nine months . Accessories: elevating leg rests (ELRs), wheel locks, extensions and anti-tippers.   Seat and back cushion  Carolyne Littles PT DPT  02/25/2019 4:54PM    02/25/19 1624

## 2019-02-25 NOTE — H&P (Signed)
History and Physical    Sue [name]:[id number] DOB: 10-01-1950 DOA: 02/24/2019  PCP: Ann Held, DO Consultants:  Servando Snare - cardiology; psychology in [place] Patient coming from:  Home - lives alone; NOK: Sue [name], daughter, [phone]  Chief Complaint: Fall  HPI: Sue [name] is a 68 y.o. female with medical history significant of TIA; hypothyroidism; HTN; COPD; and morbid obesity (BMI 53) presenting with a fall.  She tripped over her right foot and fell backwards and could not get off the floor.  Her left foot went behind her right and her knee twisted and she heard it snap.  She was in her bedroom and was able to crawl to the phone and called her son.     ED Course:  Carryover, per Dr. Alcario Drought:  68 yo F with tibial plateu fx. Wanted MRI, but wanted admission to medicine for placement to rehab after.  Review of Systems: As per HPI; otherwise review of systems reviewed and negative.   Ambulatory Status:  Ambulates without assistance  Past Medical History:  Diagnosis Date  . Allergic rhinitis   . Depression   . Emphysema of lung ([place])    3L home O2  . GERD (gastroesophageal reflux disease)   . Hypertension   . Hypothyroidism   . Stroke [name]) 2016   TIA   . Urine incontinence     Past Surgical History:  Procedure Laterality Date  . ABDOMINAL HYSTERECTOMY    . CESAREAN SECTION      Social History   Socioeconomic History  . Marital status: Divorced    Spouse name: Not on file  . Number of children: Not on file  . Years of education: Not on file  . Highest education level: Not on file  Occupational History  . Occupation: disabledIT sales professional Med  Social Needs  . Financial resource strain: Not on file  . Food insecurity    Worry: Not on file    Inability: Not on file  . Transportation needs    Medical: Not on file    Non-medical: Not on file  Tobacco Use  . Smoking status: Former Smoker    Packs/day: 1.00    Years: 40.00    Pack years: 40.00    Types: Cigarettes    Start date: 07/21/1972    Quit date: 10/16/2011    Years since quitting: 7.3  . Smokeless tobacco: Never Used  Substance and Sexual Activity  . Alcohol use: Not Currently    Comment: Occ-- Wine  . Drug use: No  . Sexual activity: Not on file  Lifestyle  . Physical activity    Days per week: Not on file    Minutes per session: Not on file  . Stress: Not on file  Relationships  . Social Herbalist on phone: Not on file    Gets together: Not on file    Attends religious service: Not on file    Active member of club or organization: Not on file    Attends meetings of clubs or organizations: Not on file    Relationship status: Not on file  . Intimate partner violence    Fear of current or ex partner: Not on file    Emotionally abused: Not on file    Physically abused: Not on file    Forced sexual activity: Not on file  Other Topics Concern  . Not on file  Social History Narrative   Sue [name] (04/06/17):  Originally from [place]. Has also lived in [place], [place]. She also previously lived in [place] for 20 years. No history of [place]. Moved to [place] in 1989. 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 No pets currently. No bird exposure. Questionable previous mold exposure in her daughter's home. 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     Allergies  Allergen Reactions  . Norvasc [Amlodipine Besylate]     Marked swelling    Family History  Problem Relation Age of Onset  . Heart disease Father        MVP and Pics Valve  . Hypertension Father   . Depression Father        Institutionalized x's 2 years  . Bipolar disorder Father   . Hypertension Sister   . Diabetes Sister   . Hyperlipidemia Sister   . Heart disease  Sister 58       MI  . Heart disease Brother   . Hypertension Brother   . Heart disease Paternal Grandmother   . Heart disease Paternal Aunt   . Heart disease Paternal Uncle   . Schizophrenia Paternal Aunt   . Asthma Son   . Asthma Son     Prior to Admission medications   Medication Sig Start Date End Date Taking? Authorizing Provider  albuterol (PROVENTIL HFA;VENTOLIN HFA) 108 (90 Base) MCG/ACT inhaler Inhale 1-2 puffs into the lungs every 6 (six) hours as needed for wheezing or shortness of breath.    [provider]  albuterol (PROVENTIL) (2.5 MG/3ML) 0.083% nebulizer solution Take 3 mLs (2.5 mg total) by nebulization every 6 (six) hours as needed for wheezing or shortness of breath. 03/31/17   Carollee Herter, Alferd Apa, DO  aspirin EC 81 MG EC tablet Take 1 tablet (81 mg total) by mouth daily. 05/03/17   Johnson, Clanford L, MD  blood glucose meter kit and supplies KIT Dispense based on patient and insurance preference. Use up to four times daily as directed. (FOR ICD-9 250.00, 250.01). 10/04/18   Carollee Herter, Alferd Apa, DO  busPIRone (BUSPAR) 15 MG tablet Take 22 mg by mouth daily.  12/27/17   [provider]  fluticasone (FLONASE) 50 MCG/ACT nasal spray Place 2 sprays into both nostrils daily as needed for allergies. 05/02/17   Johnson, Clanford L, MD  fluticasone (FLOVENT HFA) 110 MCG/ACT inhaler Inhale 2 puffs into the lungs 2 (two) times daily. 08/18/18 08/18/19  Danford, Suann Larry, MD  glipiZIDE (GLUCOTROL) 5 MG tablet Take 0.5 tablets (2.5 mg total) by mouth 2 (two) times daily before a meal. 08/18/18 08/18/19  Danford, Suann Larry, MD  insulin glargine (LANTUS) 100 UNIT/ML injection Inject 0.3 mLs (30 Units total) into the skin at bedtime. 12/06/18   Ann Held, DO  Insulin Syringe-Needle U-100 28G X 5/16" 0.5 ML MISC Use with lantus once a day 12/06/18   Carollee Herter, Alferd Apa, DO  levothyroxine (SYNTHROID) 50 MCG tablet Take 1 tablet (50 mcg total) by mouth  daily. 01/03/19   Ann Held, DO  lisinopril (ZESTRIL) 20 MG tablet Take 1 tablet by mouth once daily 01/03/19   Burtis Junes, NP  lisinopril (ZESTRIL) 20 MG tablet Take 1 tablet (20 mg total) by mouth daily. 01/03/19   Roma Schanz  R, DO  LORazepam (ATIVAN) 2 MG tablet Take 0.5-1 mg by mouth as directed. 0.5 Mg in the afternoon and 1 MG at bedtime 01/31/17   Carollee Herter, Alferd Apa, DO  metFORMIN (GLUCOPHAGE) 500 MG tablet Take 2 tablets (1,000 mg total) by mouth 2 (two) times daily with a meal. 08/18/18 08/18/19  Danford, Suann Larry, MD  metoprolol succinate (TOPROL-XL) 50 MG 24 hr tablet TAKE 2 TABLETS BY MOUTH ONCE DAILY IN THE MORNING WITH A MEAL OR IMMEDIATELY FOLLOWING A MEAL 01/03/19   Roma Schanz R, DO  OXYGEN Inhale 3 L into the lungs continuous.     [provider]  potassium chloride SA (K-DUR) 20 MEQ tablet Take 1 tablet (20 mEq total) by mouth daily. 01/03/19   Ann Held, DO  potassium chloride SA (K-DUR,KLOR-CON) 20 MEQ tablet Take 1 tablet (20 mEq total) by mouth daily. 08/30/18   Ann Held, DO  ranitidine (ZANTAC) 150 MG tablet TAKE 1 TABLET BY MOUTH ONCE DAILY AT BEDTIME 02/08/18   Tanda Rockers, MD  torsemide (DEMADEX) 20 MG tablet Take 1 tablet (20 mg total) by mouth daily. 01/03/19 04/03/19  Ann Held, DO  zolpidem (AMBIEN) 5 MG tablet Take 2 tablets (10 mg total) by mouth at bedtime. 05/02/17   Murlean Iba, MD    Physical Exam: Vitals:   02/25/19 0700 02/25/19 0715 02/25/19 0730 02/25/19 0745  BP: 124/69 131/87 117/78 116/78  Pulse: 76 72 76 75  Resp:      Temp:      TempSrc:      SpO2: 97% 100% 97% 98%  Weight:      Height:         . General:  Appears calm and comfortable and is NAD but is clearly having pain . Eyes:  PERRL, EOMI, normal lids, iris . ENT:  grossly normal hearing, lips & tongue, mmm; some absent dentition . Neck:  no LAD, masses or thyromegaly . Cardiovascular:  RRR, no  m/r/g. No LE edema.  Marland Kitchen Respiratory:   CTA bilaterally with no wheezes/rales/rhonchi.  Normal respiratory effort. . Abdomen:  soft, NT, ND, NABS . Back:   normal alignment, no CVAT . Skin:  no rash or induration seen on limited exam . Musculoskeletal:  Left knee with erythema and TTP . Lower extremity:  No LE edema.  Limited foot exam with no ulcerations.  2+ distal pulses. Marland Kitchen Psychiatric:  grossly normal mood and affect, speech fluent and appropriate, AOx3 . Neurologic:  CN 2-12 grossly intact, moves all extremities in coordinated fashion other than left leg, sensation intact    Radiological Exams on Admission: Dg Tibia/fibula Left  Result Date: 02/24/2019 CLINICAL DATA:  Per ems: Pt coming from home c/o fall which resulted in her "left leg getting twisted under her." Pt unable to bend knee but no other deformities noted. EXAM: LEFT TIBIA AND FIBULA - 2 VIEW COMPARISON:  None. FINDINGS: There are nondisplaced fractures of the lateral margin of the lateral tibial plateau and of the proximal fibula as detailed under the left knee radiographs. No other fractures.  Knee and ankle joints are normally aligned. Mild diffuse ankle soft tissue edema. IMPRESSION: 1. Nondisplaced fractures the lateral margin of the lateral tibial plateau and the proximal fibula. 2. No other fractures.  No dislocation. Electronically Signed   By: Lajean Manes M.D.   On: 02/24/2019 22:18   Mr Knee Left Wo Contrast  Result Date: 02/25/2019 CLINICAL DATA:  Left knee twisting injury due to a fall 02/24/2019. Pain. Initial encounter. EXAM: MRI OF THE LEFT KNEE WITHOUT CONTRAST TECHNIQUE: Multiplanar, multisequence MR imaging of the knee was performed. No intravenous contrast was administered. COMPARISON:  Plain films left knee 02/24/2019. FINDINGS: MENISCI Medial meniscus: Complex tear in the posterior horn includes a radial component through the meniscal root. Lateral meniscus:  Intact. LIGAMENTS Cruciates:  Intact. Collaterals:   Intact. CARTILAGE Patellofemoral: Thinning is most notable along the lateral femoral trochlea. Medial:  Thinned with associated joint space narrowing. Lateral:  Mildly to moderately degenerated. Joint:  Small effusion. Popliteal Fossa:  Small Baker's cyst. Extensor Mechanism:  Intact. Bones: Acute, comminuted fracture of the fibular head and neck and a fracture of the lateral periphery of the tibia just posterior to the iliotibial band insertion are seen as on plain films. Bulky tricompartmental osteophytes are seen. Other: None. IMPRESSION: Acute fractures of the proximal fibula and periphery of the lateral tibia as seen on plain films today. Complex tear posterior horn medial meniscus includes a radial component through the meniscal root. Negative for lateral meniscus, cruciate or collateral ligament tear. Tricompartmental osteoarthritis. Electronically Signed   By: Inge Rise M.D.   On: 02/25/2019 07:14   Dg Knee Complete 4 Views Left  Result Date: 02/24/2019 CLINICAL DATA:  Per ems: Pt coming from home c/o fall which resulted in her "left leg getting twisted under her." Pt unable to bend knee but no other deformities noted. EXAM: LEFT KNEE - COMPLETE 4+ VIEW COMPARISON:  None. FINDINGS: There is fracture of the proximal fibula extending from the fibular head to the medial proximal shaft, nondisplaced. There is another nondisplaced fracture, from the lateral margin of the lateral tibial plateau, not involving the articular surface. No other fractures. There is medial compartment joint space narrowing with marginal osteophytes from all 3 compartments. Mild lateral soft tissue edema is noted. IMPRESSION: 1. Nondisplaced fracture of the proximal fibula in from the lateral margin of the lateral tibial plateau. 2. No other acute abnormality. 3. Moderate osteoarthritis. Electronically Signed   By: Lajean Manes M.D.   On: 02/24/2019 22:17   Dg Foot Complete Left  Result Date: 02/24/2019 CLINICAL DATA:   Per ems: Pt coming from home c/o fall which resulted in her "left leg getting twisted under her." Pt unable to bend knee but no other deformities noted. EXAM: LEFT FOOT - COMPLETE 3+ VIEW COMPARISON:  None. FINDINGS: There are fractures of the fourth and fifth metatarsals. No significant fracture displacement. Fractures extend from the shafts to the bases of the metatarsal heads. No other fractures.  The joints are normally aligned. There is forefoot soft tissue swelling. IMPRESSION: 1. Fractures of the mid to distal fourth and fifth metatarsals without significant displacement or angulation. No dislocation. Electronically Signed   By: Lajean Manes M.D.   On: 02/24/2019 22:20    EKG: Independently reviewed.  NSR with rate 75; nonspecific ST changes with no evidence of acute ischemia   Labs on Admission: I have personally reviewed the available labs and imaging studies at the time of the admission.  Pertinent labs:   Glucose 168 BUN 20/Creatinine 1.73/GFR 30 - stable Hgb 11.1 - stable COVID negative  Assessment/Plan Principal Problem:   Fracture of left tibia Active Problems:   HTN (hypertension)   Chronic respiratory failure (HCC)   Hypothyroidism   Morbid obesity due to excess calories (HCC)   Hyperglycemia   L tib/fib fracture with meniscal tear; metatarsal fractures -Mechanical fall resulting  in hip fracture -Dr. Lyla Glassing was consulted overnight and I have spoken with him this AM -Fibular head fracture, okay for knee immobilizer for 1-2 weeks to help with stability -Metatarsal fractures - needs a hard sole shoe -Can weight bear as tolerated, unlikely to be able to do much due to pain -Terrible pain, unable to stand, "I don't see how we can discharge her" -Pain control with Robxain, Vicodin, and Morphine prn -CIR consult for rehab placement -Will need PT consult   Chronic respiratory failure associated with COPD -On home O2, 3L -prn Albuterol -Appears to be compensated at  this time  DM -Check A1c; it was 10.6 in 12/19, indicating poor control -Continue Lantus -hold Glucophage and Glucotrol -Cover with moderate-scale SSI  Hypothyroidism -Check TSH and free T4 -Continue Synthroid at current dose for now  Morbid obesity, BMI 53 -Activity will be challenging given the circumstances -Really needs ongoing efforts at weight loss -Consider outpatient bariatric medicine and/or surgery consults  HTN -Stopped from lisinopril, likely associated with stage 3-4 CKD -Continue Toprol XL  Stage 3-4 CKD -Needs good glycemic and BP control to decrease the risk of further progression    Note: This patient has been tested and is negative for the novel coronavirus COVID-19.    DVT prophylaxis:  Lovenox Code Status:  FULL - confirmed with patient Family Communication: None present; she is able to communicate with family at this time  Disposition Plan:  To be determined Consults called: Orthopedics by telephone only; CIR; PT/OT Admission status: It is my clinical opinion that referral for OBSERVATION is reasonable and necessary in this patient based on the above information provided. The aforementioned taken together are felt to place the patient at high risk for further clinical deterioration. However it is anticipated that the patient may be medically stable for discharge from the hospital within 24 to 48 hours.     Karmen Bongo MD [facility]   How to contact the [facility] Attending or Consulting provider [place] or covering provider during after hours [place], for this patient?  1. Check the care team in [facility] and look for a) attending/consulting TRH provider listed and b) the [facility] team listed 2. Log into [email or web address] and use [place]'s universal password to access. If you do not have the password, please contact the hospital operator. 3. Locate the [facility] provider you are looking for under [facility] and page to a number that you can be directly  reached. 4. If you still have difficulty reaching the provider, please page the [facility] (Director on Call) for the Hospitalists listed on amion for assistance.   02/25/2019, 9:31 AM

## 2019-02-25 NOTE — ED Notes (Signed)
Report called  

## 2019-02-25 NOTE — Care Management Obs Status (Signed)
[place] NOTIFICATION   Patient Details  Name: AYAHNA SOLAZZO MRN: [id number] Date of Birth: 02/20/1951   Medicare Observation Status Notification Given:  Yes    Marilu Favre, RN 02/25/2019, 2:59 PM

## 2019-02-25 NOTE — ED Notes (Signed)
Attempted to give report to Agricultural consultant at [facility]. Unavailable at this time

## 2019-02-25 NOTE — ED Triage Notes (Signed)
Pt coming from [place] to have MRI on left knee. Pt fell earlier today and has left tibial fx, and 2 fx of toes on left foot. Pt received Fentanyl at [facility]. On 3L [place] chronically

## 2019-02-25 NOTE — ED Notes (Signed)
Report given to [employer]

## 2019-02-25 NOTE — TOC Initial Note (Addendum)
Transition of Care [facility]) - Initial/Assessment Note    Patient Details  Name: Sue [name] MRN: [id number] Date of Birth: March 04, 1951  [facility] CM/SW Contact:    Marilu Favre, RN Phone Number: 02/25/2019, 2:59 PM  Clinical Narrative:                  Confirmed face sheet information with patient at bedside.   Patient from home with daughter who can provide 24 hour care.   Discussed NCM will follow for PT recommendations. Discussed home health vs SNF for short term rehab. Patient stated she does NOT want SNF at discharge, she plans to go home with home health services. Will need MD orders.  Patient has home oxygen through [facility] already.   Revisited patient after PT evaluation. Patient continues to decline SNF , wants home health and wheelchair. Asked Dr Lorin Mercy for orders and to sign progress note for wheelchair.  Patient requesting hospital bed . Asked for orders, awaiting signature on orders and DME progress note. Also awaiting home health orders. Patient has Medicare.gov list at bedside.   Patient wants hospital bed and wheel chair delivered to home confirmed address . Person to call is daughter [name] confirmed number. Patient plans to go home by private car, her son and grandson will pick her up at discharge, she has portable oxygen tank.   Will continue to follow. Expected Discharge Plan: [place] Barriers to Discharge: Continued Medical Work up   Patient Goals and CMS Choice Patient states their goals for this hospitalization and ongoing recovery are:: to go home CMS Medicare.gov Compare Post Acute Care list provided to:: Patient    Expected Discharge Plan and Services Expected Discharge Plan: [place]       Living arrangements for the past 2 months: Single Family Home                                      Prior Living Arrangements/Services Living arrangements for the past 2 months:  Single Family Home Lives with:: Adult Children Patient language and need for interpreter reviewed:: Yes Do you feel safe going back to the place where you live?: Yes      Need for Family Participation in Patient Care: Yes (Comment) Care giver support system in place?: Yes (comment)   Criminal Activity/Legal Involvement Pertinent to Current Situation/Hospitalization: No - Comment as needed  Activities of Daily Living      Permission Sought/Granted Permission sought to share information with : Case Manager                Emotional Assessment Appearance:: Appears stated age Attitude/Demeanor/Rapport: Engaged Affect (typically observed): Accepting Orientation: : Oriented to Self, Oriented to Place, Oriented to  Time, Oriented to Situation Alcohol / Substance Use: Not Applicable Psych Involvement: No (comment)  Admission diagnosis:  Tibial plateau fracture, left, closed, initial encounter [S82.142A] Patient Active Problem List   Diagnosis Date Noted  . Fracture of left tibia 02/25/2019  . Left tibial fracture 02/25/2019  . Hyperglycemia 08/16/2018  . Morbid obesity due to excess calories ([place]) 05/09/2018  . Acute bronchitis with COPD ([place]) 10/19/2017  . Atypical chest pain 05/01/2017  . Pulmonary emphysema ([place]) 04/06/2017  . Chronic seasonal allergic rhinitis 04/06/2017  . GERD (gastroesophageal reflux disease) 04/06/2017  . Snoring 04/06/2017  . Myalgia 02/01/2017  . Arthralgia 02/01/2017  .  Chronic pain of toe of left foot 11/14/2016  . Chronic hepatitis C without hepatic coma ([place]) 08/01/2016  . Hypothyroidism 10/29/2015  . Mild diastolic dysfunction 57/84/6962  . Edema 01/01/2015  . Lower extremity edema 12/24/2014  . TIA (transient ischemic attack) 07/30/2014  . Weakness 07/29/2014  . Flank pain 11/12/2013  . Chronic respiratory failure ([place]) 10/25/2013  . DOE (dyspnea on exertion) 10/25/2013  . Depression with anxiety 10/15/2013  . Insomnia 10/15/2013  . HTN  (hypertension) 10/09/2013  . Hypokalemia 10/09/2013   PCP:  Ann Held, DO Pharmacy:   [facility] [place] [address] Phone: [phone] Fax: [phone]     Social Determinants of Health (SDOH) Interventions    Readmission Risk Interventions No flowsheet data found.

## 2019-02-25 NOTE — ED Notes (Signed)
Report given to [facility]. 

## 2019-02-25 NOTE — ED Notes (Signed)
[facility] at bedside 

## 2019-02-25 NOTE — Consult Note (Signed)
ORTHOPAEDIC CONSULTATION  REQUESTING PHYSICIAN: Karmen Bongo, MD  PCP:  Carollee Herter, Alferd Apa, DO  Chief Complaint: Left knee injury, left foot injury  HPI: Sue [name] is a 68 y.o. female with medical history significant of TIA, hypothyroidism, HTN, COPD on 3L home O2, and morbid obesity (BMI 56) presenting with left foot and knee pain after a mechanical ground-level fall.  She came to the emergency department, and x-rays revealed Segond fracture to the left knee as well as a proximal fibular neck fracture.  She also was found to have minimally displaced comminuted metatarsal fracture left 4 and 5.  MRI of the left knee was obtained showing tricompartmental degenerative change with degenerative medial meniscal tear involving the posterior root and medial tibial plateau bone marrow edema.  Cruciates and collaterals intact.  She was admitted to the hospitalist service for physical therapy and disposition planning. She denies additional injuries.   Past Medical History:  Diagnosis Date  . Allergic rhinitis   . Depression   . Emphysema of lung ([place])    3L home O2  . GERD (gastroesophageal reflux disease)   . Hypertension   . Hypothyroidism   . Stroke [facility]) 2016   TIA   . Urine incontinence    Past Surgical History:  Procedure Laterality Date  . ABDOMINAL HYSTERECTOMY    . CESAREAN SECTION     Social History   Socioeconomic History  . Marital status: Divorced    Spouse name: Not on file  . Number of children: Not on file  . Years of education: Not on file  . Highest education level: Not on file  Occupational History  . Occupation: disabledIT sales professional Med  Social Needs  . Financial resource strain: Not on file  . Food insecurity    Worry: Not on file    Inability: Not on file  . Transportation needs    Medical: Not on file    Non-medical: Not on file  Tobacco Use  . Smoking status: Former Smoker    Packs/day: 1.00    Years: 40.00    Pack years: 40.00   Types: Cigarettes    Start date: 07/21/1972    Quit date: 10/16/2011    Years since quitting: 7.3  . Smokeless tobacco: Never Used  Substance and Sexual Activity  . Alcohol use: Not Currently    Comment: Occ-- Wine  . Drug use: No  . Sexual activity: Not on file  Lifestyle  . Physical activity    Days per week: Not on file    Minutes per session: Not on file  . Stress: Not on file  Relationships  . Social Herbalist on phone: Not on file    Gets together: Not on file    Attends religious service: Not on file    Active member of club or organization: Not on file    Attends meetings of clubs or organizations: Not on file    Relationship status: Not on file  Other Topics Concern  . Not on file  Social History Narrative   [facility] (04/06/17):   Originally from [place]. Has also lived in [place], [place]. She also previously lived in [place] for 20 years. No history of [place] Fever. Moved to [place] in 1989. 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 No pets currently. No bird exposure. Questionable previous mold exposure in her daughter's home. 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    Family History  Problem Relation Age of Onset  . Heart disease Father        MVP and Pics Valve  . Hypertension Father   . Depression Father        Institutionalized x's 2 years  . Bipolar disorder Father   . Hypertension Sister   . Diabetes Sister   . Hyperlipidemia Sister   . Heart disease Sister 34       MI  . Heart disease Brother   . Hypertension Brother   . Heart disease Paternal Grandmother   . Heart disease Paternal Aunt   . Heart disease Paternal Uncle   . Schizophrenia Paternal Aunt   . Asthma Son   . Asthma Son    Allergies  Allergen Reactions  . Norvasc [Amlodipine  Besylate]     Marked swelling   Prior to Admission medications   Medication Sig Start Date End Date Taking? Authorizing Provider  albuterol (PROVENTIL HFA;VENTOLIN HFA) 108 (90 Base) MCG/ACT inhaler Inhale 1-2 puffs into the lungs every 6 (six) hours as needed for wheezing or shortness of breath.   Yes [provider]  albuterol (PROVENTIL) (2.5 MG/3ML) 0.083% nebulizer solution Take 3 mLs (2.5 mg total) by nebulization every 6 (six) hours as needed for wheezing or shortness of breath. 03/31/17  Yes Ann Held, DO  aspirin EC 81 MG EC tablet Take 1 tablet (81 mg total) by mouth daily. 05/03/17  Yes Johnson, Clanford L, MD  busPIRone (BUSPAR) 15 MG tablet Take 15 mg by mouth 2 (two) times daily.  12/27/17  Yes [provider]  famotidine (PEPCID) 20 MG tablet Take 20 mg by mouth daily as needed for heartburn or indigestion.   Yes [provider]  fluticasone (FLONASE) 50 MCG/ACT nasal spray Place 2 sprays into both nostrils daily as needed for allergies. 05/02/17  Yes Johnson, Clanford L, MD  glipiZIDE (GLUCOTROL) 5 MG tablet Take 0.5 tablets (2.5 mg total) by mouth 2 (two) times daily before a meal. 08/18/18 08/18/19 Yes Danford, Suann Larry, MD  insulin glargine (LANTUS) 100 UNIT/ML injection Inject 0.3 mLs (30 Units total) into the skin at bedtime. 12/06/18  Yes Ann Held, DO  levothyroxine (SYNTHROID) 50 MCG tablet Take 1 tablet (50 mcg total) by mouth daily. 01/03/19  Yes Roma Schanz R, DO  LORazepam (ATIVAN) 2 MG tablet Take 0.5-1 mg by mouth as directed. 0.5 Mg in the afternoon and 1 MG at bedtime 01/31/17  Yes Roma Schanz R, DO  metFORMIN (GLUCOPHAGE) 500 MG tablet Take 2 tablets (1,000 mg total) by mouth 2 (two) times daily with a meal. 08/18/18 08/18/19 Yes Danford, Suann Larry, MD  metoprolol succinate (TOPROL-XL) 50 MG 24 hr tablet TAKE 2 TABLETS BY MOUTH ONCE DAILY IN THE MORNING WITH A MEAL OR IMMEDIATELY FOLLOWING A MEAL 01/03/19   Yes Roma Schanz R, DO  OXYGEN Inhale 3 L into the lungs continuous.    Yes [provider]  potassium chloride SA (K-DUR) 20 MEQ tablet Take 1 tablet (20 mEq total) by mouth daily. 01/03/19  Yes Roma Schanz R, DO  QUEtiapine (SEROQUEL) 100 MG tablet Take 100 mg by mouth at bedtime. 02/23/19  Yes [provider]  torsemide (DEMADEX) 20 MG tablet Take 1 tablet (20 mg  total) by mouth daily. Patient taking differently: Take 40 mg by mouth daily. Taking 2 tablets(34m) daily 01/03/19 04/03/19 Yes LRoma SchanzR, DO  zolpidem (AMBIEN) 5 MG tablet Take 2 tablets (10 mg total) by mouth at bedtime. 05/02/17  Yes Johnson, Clanford L, MD  blood glucose meter kit and supplies KIT Dispense based on patient and insurance preference. Use up to four times daily as directed. (FOR ICD-9 250.00, 250.01). 10/04/18   LCarollee Herter YAlferd Apa DO  Insulin Syringe-Needle U-100 28G X 5/16" 0.5 ML MISC Use with lantus once a day 12/06/18   LCarollee Herter YAlferd Apa DO   Dg Tibia/fibula Left  Result Date: 02/24/2019 CLINICAL DATA:  Per ems: Pt coming from home c/o fall which resulted in her "left leg getting twisted under her." Pt unable to bend knee but no other deformities noted. EXAM: LEFT TIBIA AND FIBULA - 2 VIEW COMPARISON:  None. FINDINGS: There are nondisplaced fractures of the lateral margin of the lateral tibial plateau and of the proximal fibula as detailed under the left knee radiographs. No other fractures.  Knee and ankle joints are normally aligned. Mild diffuse ankle soft tissue edema. IMPRESSION: 1. Nondisplaced fractures the lateral margin of the lateral tibial plateau and the proximal fibula. 2. No other fractures.  No dislocation. Electronically Signed   By: DLajean ManesM.D.   On: 02/24/2019 22:18   Mr Knee Left Wo Contrast  Result Date: 02/25/2019 CLINICAL DATA:  Left knee twisting injury due to a fall 02/24/2019. Pain. Initial encounter. EXAM: MRI OF THE LEFT KNEE WITHOUT  CONTRAST TECHNIQUE: Multiplanar, multisequence MR imaging of the knee was performed. No intravenous contrast was administered. COMPARISON:  Plain films left knee 02/24/2019. FINDINGS: MENISCI Medial meniscus: Complex tear in the posterior horn includes a radial component through the meniscal root. Lateral meniscus:  Intact. LIGAMENTS Cruciates:  Intact. Collaterals:  Intact. CARTILAGE Patellofemoral: Thinning is most notable along the lateral femoral trochlea. Medial:  Thinned with associated joint space narrowing. Lateral:  Mildly to moderately degenerated. Joint:  Small effusion. Popliteal Fossa:  Small Baker's cyst. Extensor Mechanism:  Intact. Bones: Acute, comminuted fracture of the fibular head and neck and a fracture of the lateral periphery of the tibia just posterior to the iliotibial band insertion are seen as on plain films. Bulky tricompartmental osteophytes are seen. Other: None. IMPRESSION: Acute fractures of the proximal fibula and periphery of the lateral tibia as seen on plain films today. Complex tear posterior horn medial meniscus includes a radial component through the meniscal root. Negative for lateral meniscus, cruciate or collateral ligament tear. Tricompartmental osteoarthritis. Electronically Signed   By: TInge RiseM.D.   On: 02/25/2019 07:14   Dg Knee Complete 4 Views Left  Result Date: 02/24/2019 CLINICAL DATA:  Per ems: Pt coming from home c/o fall which resulted in her "left leg getting twisted under her." Pt unable to bend knee but no other deformities noted. EXAM: LEFT KNEE - COMPLETE 4+ VIEW COMPARISON:  None. FINDINGS: There is fracture of the proximal fibula extending from the fibular head to the medial proximal shaft, nondisplaced. There is another nondisplaced fracture, from the lateral margin of the lateral tibial plateau, not involving the articular surface. No other fractures. There is medial compartment joint space narrowing with marginal osteophytes from all 3  compartments. Mild lateral soft tissue edema is noted. IMPRESSION: 1. Nondisplaced fracture of the proximal fibula in from the lateral margin of the lateral tibial plateau. 2. No other acute abnormality.  3. Moderate osteoarthritis. Electronically Signed   By: Lajean Manes M.D.   On: 02/24/2019 22:17   Dg Foot Complete Left  Result Date: 02/24/2019 CLINICAL DATA:  Per ems: Pt coming from home c/o fall which resulted in her "left leg getting twisted under her." Pt unable to bend knee but no other deformities noted. EXAM: LEFT FOOT - COMPLETE 3+ VIEW COMPARISON:  None. FINDINGS: There are fractures of the fourth and fifth metatarsals. No significant fracture displacement. Fractures extend from the shafts to the bases of the metatarsal heads. No other fractures.  The joints are normally aligned. There is forefoot soft tissue swelling. IMPRESSION: 1. Fractures of the mid to distal fourth and fifth metatarsals without significant displacement or angulation. No dislocation. Electronically Signed   By: Lajean Manes M.D.   On: 02/24/2019 22:20    Positive ROS: All other systems have been reviewed and were otherwise negative with the exception of those mentioned in the HPI and as above.  Physical Exam: General: Alert, no acute distress Cardiovascular: No pedal edema Respiratory: No cyanosis, no use of accessory musculature GI: No organomegaly, abdomen is soft and non-tender Skin: No lesions in the area of chief complaint Neurologic: Sensation intact distally Psychiatric: Patient is competent for consent with normal mood and affect Lymphatic: No axillary or cervical lymphadenopathy  MUSCULOSKELETAL: Examination of the left lower extremity reveals no skin wounds or lesions.  She has swelling and tenderness to palpation over the lateral aspect of the forefoot.  She has tenderness to palpation over the proximal fibula.  Range of motion and stability testing limited by pain.  Able to SLR. Neurovascularly  intact distally.  Assessment: Left fibular head fracture. Left knee osteoarthritis with sequela. Segond fracture left knee. Left foot metatarsal neck fracture 4 and 5, amenable to conservative treatment. Morbid obesity.  Plan: I discussed the findings with the patient.  MRI negative for multi-ligamentous knee injury.  Treatment is nonoperative.  Recommend knee immobilizer for a couple of weeks.  Weight-bear as tolerated in a hard sole shoe.  Follow-up in the office in 2 weeks for repeat radiographs.  All questions solicited and answered.    Bertram Savin, MD Cell [phone]    02/25/2019 1:02 PM

## 2019-02-25 NOTE — Progress Notes (Signed)
PT Cancellation Note  Patient Details Name: Sue [name] MRN: [id number] DOB: 1951-04-26   Cancelled Treatment:      Attempted to see patient for immanent discharge order. Nursing reported that the patient just got situated and requested that therapy come back later. Therapy will follow up in the afternoon.    Carney Living PT DPT  02/25/2019, 11:24 AM

## 2019-02-25 NOTE — Evaluation (Signed)
Occupational Therapy Evaluation Patient Details Name: Sue [name] MRN: [id number] DOB: [birth year] Today's Date: 02/25/2019    History of Present Illness Pt is a 68 y/o female presenting after a fall, imaging reveals: Segond fracture to the left knee as well as a proximal fibular neck fracture, have minimally displaced comminuted metatarsal fracture left 4 and 5.  MRI negative for multi-ligamentous knee injury. Per Orthopedics- treatment is nonoperative; knee immobilizer and weightbear as tolerated in a hard sole shoe. PMH: TIA, hypothyroidism, HTN, COPD on 3L home O2, CVA, and morbid obesity (BMI 53).    Clinical Impression   PTA patient reports independent for mobility (furniture walking) and ADLs/IADLs (supervision for bathing). Admitted for above and limited by problem list below including pain in L LE, impaired balance and decreased act tolerance.  Patient able to complete bed mobility with min-max assist, grooming/UB ADLs with setup assist and LB ADLs with total assist.  Unable to stand due to pain, but able to scoot towards [facility] with min assist.  Requires increased time and effort for all activities, fatigue easily and is SOB upon exertion (pt reports baseline).  Will follow while admitted, she will benefit from continued OT services acutely and after dc at SNF level in order to maximize independence and safety with ADls/mobiltiy.      Follow Up Recommendations  SNF;Supervision/Assistance - 24 hour    Equipment Recommendations  3 in 1 bedside commode    Recommendations for Other Services       Precautions / Restrictions Precautions Precautions: Fall Precaution Comments: recent fall at home  Required Braces or Orthoses: Knee Immobilizer - Left;Other Brace(hard sole shoe) Restrictions Weight Bearing Restrictions: Yes LLE Weight Bearing: Weight bearing as tolerated Other Position/Activity Restrictions: wearing knee immobilizer and hard sole shoe      Mobility Bed  Mobility Overal bed mobility: Needs Assistance Bed Mobility: Sit to Supine;Supine to Sit     Supine to sit: Min assist Sit to supine: Max assist   General bed mobility comments: cueing for technique, assist for mgmt of L LE to EOB with increased time and effort to scoot foward; transition to supine with B LE support   Transfers Overall transfer level: Needs assistance Equipment used: Rolling walker (2 wheeled) Transfers: Sit to/from Stand Sit to Stand: Max assist;+2 physical assistance;+2 safety/equipment         General transfer comment: attempted sit to stand, but unable to ascend due to pain and unable to bear weight through L LE; able to scoot towards HOB with min assist and increased time     Balance Overall balance assessment: Needs assistance Sitting-balance support: No upper extremity supported;Feet supported Sitting balance-Leahy Scale: Fair                                     ADL either performed or assessed with clinical judgement   ADL Overall ADL's : Needs assistance/impaired     Grooming: Oral care;Set up;Sitting   Upper Body Bathing: Set up;Sitting   Lower Body Bathing: Sitting/lateral leans;Total assistance;Bed level   Upper Body Dressing : Set up;Sitting   Lower Body Dressing: Total assistance;Sitting/lateral leans;Bed level     Toilet Transfer Details (indicate cue type and reason): deferred         Functional mobility during ADLs: Moderate assistance General ADL Comments: limited to EOB only, pt limited by pain, imapired balance and body habitus     Vision  Perception     Praxis      Pertinent Vitals/Pain Pain Assessment: Faces Faces Pain Scale: Hurts even more Pain Location: left knee and foot  Pain Descriptors / Indicators: Aching;Guarding;Grimacing;Discomfort Pain Intervention(s): Monitored during session;Repositioned;Premedicated before session;Limited activity within patient's tolerance     Hand  Dominance Right   Extremity/Trunk Assessment Upper Extremity Assessment Upper Extremity Assessment: RUE deficits/detail;LUE deficits/detail RUE Deficits / Details: WFL, grossly 4/5 MMT LUE Deficits / Details: grossly 4/5 MMT, limited FF to 90(reports straining UE recently)    Lower Extremity Assessment Lower Extremity Assessment: Defer to PT evaluation LLE: Unable to fully assess due to pain       Communication Communication Communication: No difficulties   Cognition Arousal/Alertness: Awake/alert Behavior During Therapy: WFL for tasks assessed/performed Overall Cognitive Status: Within Functional Limits for tasks assessed                                     General Comments       Exercises     Shoulder Instructions      Home Living Family/patient expects to be discharged to:: Private residence Living Arrangements: Alone Available Help at Discharge: Family;Available 24 hours/day(reports daughter plans to assist 24/7) Type of Home: Apartment Home Access: Stairs to enter Entrance Stairs-Number of Steps: 2   Home Layout: One level     Bathroom Shower/Tub: Teacher, early years/pre: Standard Bathroom Accessibility: Yes How Accessible: Accessible via walker Home Equipment: Shower seat;Walker - 4 wheels          Prior Functioning/Environment Level of Independence: Needs assistance  Gait / Transfers Assistance Needed: holds onto furniture at home ADL's / Homemaking Assistance Needed: reports independent ADLs, IADLs (daughter supervises bathing) and reports increased difficulty with LB ADLs            OT Problem List: Decreased strength;Decreased activity tolerance;Impaired balance (sitting and/or standing);Pain;Decreased knowledge of use of DME or AE;Decreased knowledge of precautions;Decreased safety awareness      OT Treatment/Interventions: Self-care/ADL training;DME and/or AE instruction;Therapeutic activities;Balance  training;Patient/family education;Therapeutic exercise    OT Goals(Current goals can be found in the care plan section) Acute Rehab OT Goals Patient Stated Goal: to go home  OT Goal Formulation: With patient Time For Goal Achievement: 03/11/19 Potential to Achieve Goals: Good  OT Frequency: Min 2X/week   Barriers to D/C:            Co-evaluation              AM-PAC OT "6 Clicks" Daily Activity     Outcome Measure Help from another person eating meals?: None Help from another person taking care of personal grooming?: None(seated) Help from another person toileting, which includes using toliet, bedpan, or urinal?: Total Help from another person bathing (including washing, rinsing, drying)?: A Lot Help from another person to put on and taking off regular upper body clothing?: A Little Help from another person to put on and taking off regular lower body clothing?: Total 6 Click Score: 15   End of Session Equipment Utilized During Treatment: Gait belt;Right knee immobilizer Nurse Communication: Mobility status;Precautions;Other (comment)(pain')  Activity Tolerance: Patient limited by pain Patient left: in bed;with call bell/phone within reach;with bed alarm set  OT Visit Diagnosis: Other abnormalities of gait and mobility (R26.89);Pain Pain - Right/Left: Left Pain - part of body: Leg  Time: 0813-8871 OT Time Calculation (min): 33 min Charges:  OT General Charges $OT Visit: 1 Visit OT Evaluation $OT Eval Moderate Complexity: 1 Mod OT Treatments $Self Care/Home Management : 8-22 mins  Delight Stare, OT Acute Rehabilitation Services Pager [phone] Office [phone]   Delight Stare 02/25/2019, 4:20 PM

## 2019-02-25 NOTE — Progress Notes (Signed)
Orthopedic Tech Progress Note Patient Details:  Sue [name] 07/18/1951 [id number]  Ortho Devices Type of Ortho Device: Knee Immobilizer, Postop shoe/boot Ortho Device/Splint Interventions: Adjustment, Application, Ordered   Post Interventions Patient Tolerated: Well Instructions Provided: Poper ambulation with device, Care of device, Adjustment of device   Melony Overly T 02/25/2019, 9:55 AM

## 2019-02-26 DIAGNOSIS — J9611 Chronic respiratory failure with hypoxia: Secondary | ICD-10-CM | POA: Diagnosis not present

## 2019-02-26 DIAGNOSIS — S82102A Unspecified fracture of upper end of left tibia, initial encounter for closed fracture: Secondary | ICD-10-CM

## 2019-02-26 DIAGNOSIS — J440 Chronic obstructive pulmonary disease with acute lower respiratory infection: Secondary | ICD-10-CM | POA: Diagnosis not present

## 2019-02-26 LAB — CBC
HCT: 33.6 % — ABNORMAL LOW (ref 36.0–46.0)
Hemoglobin: 10.5 g/dL — ABNORMAL LOW (ref 12.0–15.0)
MCH: 31.3 pg (ref 26.0–34.0)
MCHC: 31.3 g/dL (ref 30.0–36.0)
MCV: 100 fL (ref 80.0–100.0)
Platelets: 173 10*3/uL (ref 150–400)
RBC: 3.36 MIL/uL — ABNORMAL LOW (ref 3.87–5.11)
RDW: 13.6 % (ref 11.5–15.5)
WBC: 9.2 10*3/uL (ref 4.0–10.5)
nRBC: 0 % (ref 0.0–0.2)

## 2019-02-26 LAB — GLUCOSE, CAPILLARY
Glucose-Capillary: 134 mg/dL — ABNORMAL HIGH (ref 70–99)
Glucose-Capillary: 145 mg/dL — ABNORMAL HIGH (ref 70–99)

## 2019-02-26 LAB — BASIC METABOLIC PANEL
Anion gap: 9 (ref 5–15)
BUN: 20 mg/dL (ref 8–23)
CO2: 29 mmol/L (ref 22–32)
Calcium: 8.7 mg/dL — ABNORMAL LOW (ref 8.9–10.3)
Chloride: 97 mmol/L — ABNORMAL LOW (ref 98–111)
Creatinine, Ser: 1.7 mg/dL — ABNORMAL HIGH (ref 0.44–1.00)
GFR calc Af Amer: 36 mL/min — ABNORMAL LOW (ref >60)
GFR calc non Af Amer: 31 mL/min — ABNORMAL LOW (ref >60)
Glucose, Bld: 149 mg/dL — ABNORMAL HIGH (ref 70–99)
Potassium: 4.1 mmol/L (ref 3.5–5.1)
Sodium: 135 mmol/L (ref 135–145)

## 2019-02-26 MED ORDER — METHOCARBAMOL 500 MG PO TABS: 500.0000 mg | ORAL_TABLET | Freq: Four times a day (QID) | ORAL | 0 refills | Status: DC | PRN

## 2019-02-26 MED ORDER — HYDROCODONE-ACETAMINOPHEN 5-325 MG PO TABS: 1.0000 | ORAL_TABLET | Freq: Four times a day (QID) | ORAL | 0 refills | Status: DC | PRN

## 2019-02-26 MED ORDER — BISACODYL 10 MG RE SUPP
10.0000 mg | Freq: Once | RECTAL | Status: AC
  Administered 2019-02-26: 12:00:00 10 mg via RECTAL
  Filled 2019-02-26: qty 1

## 2019-02-26 NOTE — Care Management (Signed)
    Durable Medical Equipment  (From admission, onward)         Start     Ordered   02/26/19 0959  For home use only DME standard manual wheelchair with seat cushion  Once    Comments: Patient suffers from Left fibular head fracture. Left knee osteoarthritis with sequela. Segond fracture left knee. Left foot metatarsal neck fracture 4 and 5, amenable to conservative treatment. Morbid obesity which impairs their ability to perform daily activities like ambulating in the home.  A cane will not resolve issue with performing activities of daily living. A wheelchair will allow patient to safely perform daily activities. Patient can safely propel the wheelchair in the home or has a caregiver who can provide assistance. Length of need lifetime . Accessories: elevating leg rests (ELRs), wheel locks, extensions and anti-tippers.  Heavy duty   Seat and back cushions   02/26/19 0959   02/25/19 1637  For home use only DME Hospital bed  Once    Question Answer Comment  Length of Need Lifetime   Patient has (list medical condition): Left fibular head fracture.   The above medical condition requires: Patient requires the ability to reposition frequently   Head must be elevated greater than: 45 degrees   Bed type Semi-electric   Support Surface: Gel Overlay      02/25/19 1637   02/25/19 1624  For home use only DME lightweight manual wheelchair with seat cushion  Once    Comments: Patient suffers from Left fibular head fracture. Left knee osteoarthritis with sequela. Segond fracture left knee. Left foot metatarsal neck fracture 4 and 5, amenable to conservative treatment. Morbid obesity.which impairs their ability to perform daily activities like ambulating  in the home.  A cane will not resolve  issue with performing activities of daily living. A wheelchair will allow patient to safely perform daily activities. Patient is not able to propel themselves in the home using a standard weight wheelchair  due toLeft fibular head fracture. Left knee osteoarthritis with sequela. Segond fracture left knee. Left foot metatarsal neck fracture 4 and 5, amenable to conservative treatment. Morbid obesity.. Patient can self propel in the lightweight wheelchair. Length of need nine months . Accessories: elevating leg rests (ELRs), wheel locks, extensions and anti-tippers.   Seat and back cushion   02/25/19 1624

## 2019-02-26 NOTE — Progress Notes (Signed)
Occupational Therapy Treatment Patient Details Name: Sue [name] MRN: [id number] DOB: 20-Jun-1951 Today's Date: 02/26/2019    History of present illness Pt is a 68 y/o female presenting after a fall, imaging reveals: Segond fracture to the left knee as well as a proximal fibular neck fracture, have minimally displaced comminuted metatarsal fracture left 4 and 5.  MRI negative for multi-ligamentous knee injury. Per Orthopedics- treatment is nonoperative; knee immobilizer and weightbear as tolerated in a hard sole shoe. PMH: TIA, hypothyroidism, HTN, COPD on 3L home O2, CVA, and morbid obesity (BMI 53).    OT comments  Pt making slow progress with functional goals. Pt requires increased time with initiating mobility and reports more pain standing but able to use RW for simulated toilet transfers Mod A. Simulated LB bathing tasks max a. Pt continues to refuse SNF, so will need max HH therapies and assist with ADLs/selfcare from family/caregivers. OT will continue to follow acutely  Follow Up Recommendations  SNF;Supervision/Assistance - 24 hour    Equipment Recommendations  3 in 1 bedside commode    Recommendations for Other Services      Precautions / Restrictions Precautions Precautions: Fall Precaution Comments: recent fall at home  Required Braces or Orthoses: Knee Immobilizer - Left;Other Brace Other Brace: No order but per ortho follow up not patient to wear knee immobilizer and post op shoe on L side "for a few weeks" Restrictions Weight Bearing Restrictions: Yes LLE Weight Bearing: Weight bearing as tolerated Other Position/Activity Restrictions: wearing knee immobilizer and hard sole shoe       Mobility Bed Mobility Overal bed mobility: Needs Assistance Bed Mobility: Supine to Sit     Supine to sit: Min assist     General bed mobility comments: pt in recliner upon arrival  Transfers Overall transfer level: Needs assistance Equipment used: Rolling walker (2  wheeled) Transfers: Sit to/from Stand Sit to Stand: Mod assist         General transfer comment: cues for correct hand placement    Balance Overall balance assessment: Needs assistance Sitting-balance support: No upper extremity supported;Feet supported Sitting balance-Leahy Scale: Fair     Standing balance support: During functional activity Standing balance-Leahy Scale: Fair                             ADL either performed or assessed with clinical judgement   ADL Overall ADL's : Needs assistance/impaired     Grooming: Wash/dry hands;Wash/dry face;Set up;Sitting       Lower Body Bathing: Sitting/lateral leans;Maximal assistance       Lower Body Dressing: Total assistance;Sitting/lateral leans   Toilet Transfer: Moderate assistance;RW;Cueing for sequencing;Cueing for safety Toilet Transfer Details (indicate cue type and reason): simulated recliner to EOB and back to recliner Toileting- Clothing Manipulation and Hygiene: Total assistance       Functional mobility during ADLs: Moderate assistance General ADL Comments: pt limited by pain, imapired balance and body habitus     Vision Baseline Vision/History: Wears glasses Wears Glasses: At all times Patient Visual Report: No change from baseline     Perception     Praxis      Cognition Arousal/Alertness: Awake/alert Behavior During Therapy: WFL for tasks assessed/performed Overall Cognitive Status: Within Functional Limits for tasks assessed  Exercises     Shoulder Instructions       General Comments      Pertinent Vitals/ Pain       Pain Assessment: 0-10 Pain Score: 6  Pain Location: left knee and foot  Pain Descriptors / Indicators: Aching;Guarding;Grimacing;Discomfort Pain Intervention(s): Monitored during session;Premedicated before session  Home Living                                          Prior  Functioning/Environment              Frequency  Min 2X/week        Progress Toward Goals  OT Goals(current goals can now be found in the care plan section)  Progress towards OT goals: Progressing toward goals  Acute Rehab OT Goals Patient Stated Goal: to go home   Plan Discharge plan remains appropriate    Co-evaluation                 AM-PAC OT "6 Clicks" Daily Activity     Outcome Measure   Help from another person eating meals?: None Help from another person taking care of personal grooming?: None Help from another person toileting, which includes using toliet, bedpan, or urinal?: Total Help from another person bathing (including washing, rinsing, drying)?: A Lot Help from another person to put on and taking off regular upper body clothing?: A Little Help from another person to put on and taking off regular lower body clothing?: Total 6 Click Score: 15    End of Session Equipment Utilized During Treatment: Rolling walker;Oxygen  OT Visit Diagnosis: Other abnormalities of gait and mobility (R26.89);Pain Pain - Right/Left: Left Pain - part of body: Leg   Activity Tolerance Patient limited by pain   Patient Left with call bell/phone within reach;in chair   Nurse Communication          Time: 9450-3888 OT Time Calculation (min): 24 min  Charges: OT General Charges $OT Visit: 1 Visit OT Treatments $Self Care/Home Management : 8-22 mins $Therapeutic Activity: 8-22 mins     Britt Bottom 02/26/2019, 3:08 PM

## 2019-02-26 NOTE — Progress Notes (Signed)
Pt discharged home in stable condition after going over discharge teaching with no concerns voiced 

## 2019-02-26 NOTE — Discharge Summary (Addendum)
Physician Discharge Summary  Sue [name] TGY:[id number] DOB: 01-10-1951 DOA: 02/24/2019  PCP: Ann Held, DO  Admit date: 02/24/2019 Discharge date: 02/26/2019  Time spent: 45 minutes  Recommendations for Outpatient Follow-up:  1. Follow up with orthopedics 2 weeks for evaluation of knee  2. Weight bear as tolerated in hard sole shoe. Knee immobilizer 3. Follow up PCP regarding thyroid function.     Discharge Diagnoses:  Principal Problem:   Fracture of left tibia Active Problems:   HTN (hypertension)   Chronic respiratory failure (HCC)   Hypothyroidism   Morbid obesity due to excess calories ([place])   Hyperglycemia   Left tibial fracture   Discharge Condition: stable  Diet recommendation: heart healthy  Filed Weights   02/24/19 2341  Weight: 136.1 kg    History of present illness:  Sue [name] is a 68 y.o. female with medical history significant of TIA; hypothyroidism; HTN; COPD; and morbid obesity (BMI 53) presented 6/15 with a fall.  She tripped over her right foot and fell backwards and could not get off the floor.  Her left foot went behind her right and her knee twisted and she heard it snap.  She was in her bedroom and was able to crawl to the phone and called her son.    Hospital Course:  L tib/fib fracture with meniscal tear; metatarsal fractures -evaluated by ortho who opined fibular head fracture, okay for knee immobilizer for 1-2 weeks to help with stability -Metatarsal fractures - needs a hard sole shoe -Can weight bear as tolerated, unlikely to be able to do much due to pain.physical therapy evaluated and recommended snf and patient refused -sent home with [facility] and wheelchair and hospital bed    Chronic respiratory failure associated with COPD due to hypoxia -On home O2, 3L. Stable at baseline.   DM type 2 A1c 6.7 today. Improved from 10.6 6 months ago. Close OP follow up   Hypothyroidism. tsh 7.3. OP follow up   Morbid obesity, BMI 53  Consider outpatient bariatric medicine and/or surgery consults   HTN fair control   Stage 3-4 CKD -Needs good glycemic and BP control to decrease the risk of further progression     Procedures:    Consultations:  [facility]  Discharge Exam: Vitals:   02/25/19 2059 02/26/19 0402  BP: 122/73 136/86  Pulse: 83 100  Resp: 19 19  Temp: 98.2 F (36.8 C) 98.3 F (36.8 C)  SpO2: 99% 95%    General: obese awake alert no acute distress Cardiovascular: rrr no mgr no LE edema Respiratory: normal effort BS clear bilaterally   Discharge Instructions   Discharge Instructions     Call MD for:  difficulty breathing, headache or visual disturbances   Complete by: As directed    Call MD for:  persistant dizziness or light-headedness   Complete by: As directed    Call MD for:  persistant nausea and vomiting   Complete by: As directed    Call MD for:  severe uncontrolled pain   Complete by: As directed    Diet - low sodium heart healthy   Complete by: As directed    Discharge instructions   Complete by: As directed    Follow up with orthopedics 2 weeks Weight bear as tolerated with walker Take medication as prescribed   Increase activity slowly   Complete by: As directed       Allergies as of 02/26/2019       Reactions  Norvasc [amlodipine Besylate]    Marked swelling        Medication List     STOP taking these medications    aspirin 81 MG EC tablet       TAKE these medications    albuterol 108 (90 Base) MCG/ACT inhaler Commonly known as: VENTOLIN HFA Inhale 1-2 puffs into the lungs every 6 (six) hours as needed for wheezing or shortness of breath.   albuterol (2.5 MG/3ML) 0.083% nebulizer solution Commonly known as: PROVENTIL Take 3 mLs (2.5 mg total) by nebulization every 6 (six) hours as needed for wheezing or shortness of breath.   Ativan 2 MG tablet Generic drug: LORazepam Take 0.5-1 mg by mouth as directed. 0.5 Mg in the afternoon and 1  MG at bedtime   blood glucose meter kit and supplies Kit Dispense based on patient and insurance preference. Use up to four times daily as directed. (FOR ICD-9 250.00, 250.01).   busPIRone 15 MG tablet Commonly known as: BUSPAR Take 15 mg by mouth 2 (two) times daily.   famotidine 20 MG tablet Commonly known as: PEPCID Take 20 mg by mouth daily as needed for heartburn or indigestion.   fluticasone 50 MCG/ACT nasal spray Commonly known as: FLONASE Place 2 sprays into both nostrils daily as needed for allergies.   glipiZIDE 5 MG tablet Commonly known as: GLUCOTROL Take 0.5 tablets (2.5 mg total) by mouth 2 (two) times daily before a meal.   HYDROcodone-acetaminophen 5-325 MG tablet Commonly known as: NORCO/VICODIN Take 1-2 tablets by mouth every 6 (six) hours as needed for moderate pain.   insulin glargine 100 UNIT/ML injection Commonly known as: LANTUS Inject 0.3 mLs (30 Units total) into the skin at bedtime.   Insulin Syringe-Needle U-100 28G X 5/16" 0.5 ML Misc Use with lantus once a day   levothyroxine 50 MCG tablet Commonly known as: SYNTHROID Take 1 tablet (50 mcg total) by mouth daily.   metFORMIN 500 MG tablet Commonly known as: Glucophage Take 2 tablets (1,000 mg total) by mouth 2 (two) times daily with a meal.   methocarbamol 500 MG tablet Commonly known as: ROBAXIN Take 1 tablet (500 mg total) by mouth every 6 (six) hours as needed for muscle spasms.   metoprolol succinate 50 MG 24 hr tablet Commonly known as: TOPROL-XL TAKE 2 TABLETS BY MOUTH ONCE DAILY IN THE MORNING WITH A MEAL OR IMMEDIATELY FOLLOWING A MEAL   OXYGEN Inhale 3 L into the lungs continuous.   potassium chloride SA 20 MEQ tablet Commonly known as: K-DUR Take 1 tablet (20 mEq total) by mouth daily.   QUEtiapine 100 MG tablet Commonly known as: SEROQUEL Take 100 mg by mouth at bedtime.   torsemide 20 MG tablet Commonly known as: DEMADEX Take 1 tablet (20 mg total) by mouth  daily. What changed:   how much to take  additional instructions   zolpidem 5 MG tablet Commonly known as: AMBIEN Take 2 tablets (10 mg total) by mouth at bedtime.               Durable Medical Equipment  (From admission, onward)           Start     Ordered   02/26/19 0959  For home use only DME standard manual wheelchair with seat cushion  Once    Comments: Patient suffers from Left fibular head fracture. Left knee osteoarthritis with sequela. Segond fracture left knee. Left foot metatarsal neck fracture 4 and 5, amenable to conservative treatment.  Morbid obesity which impairs their ability to perform daily activities like ambulating in the home.  A cane will not resolve issue with performing activities of daily living. A wheelchair will allow patient to safely perform daily activities. Patient can safely propel the wheelchair in the home or has a caregiver who can provide assistance. Length of need lifetime . Accessories: elevating leg rests (ELRs), wheel locks, extensions and anti-tippers.  Heavy duty   Seat and back cushions   02/26/19 0959   02/25/19 1637  For home use only DME Hospital bed  Once    Question Answer Comment  Length of Need Lifetime   Patient has (list medical condition): Left fibular head fracture.   The above medical condition requires: Patient requires the ability to reposition frequently   Head must be elevated greater than: 45 degrees   Bed type Semi-electric   Support Surface: Gel Overlay      02/25/19 1637   02/25/19 1624  For home use only DME lightweight manual wheelchair with seat cushion  Once    Comments: Patient suffers from Left fibular head fracture. Left knee osteoarthritis with sequela. Segond fracture left knee. Left foot metatarsal neck fracture 4 and 5, amenable to conservative treatment. Morbid obesity.which impairs their ability to perform daily activities like ambulating  in the home.  A cane will not resolve  issue  with performing activities of daily living. A wheelchair will allow patient to safely perform daily activities. Patient is not able to propel themselves in the home using a standard weight wheelchair due toLeft fibular head fracture. Left knee osteoarthritis with sequela. Segond fracture left knee. Left foot metatarsal neck fracture 4 and 5, amenable to conservative treatment. Morbid obesity.. Patient can self propel in the lightweight wheelchair. Length of need nine months . Accessories: elevating leg rests (ELRs), wheel locks, extensions and anti-tippers.   Seat and back cushion   02/25/19 1624           Allergies  Allergen Reactions  . Norvasc [Amlodipine Besylate]     Marked swelling   Follow-up Information     [facility] Follow up.          [facility] .   Specialty: [facility]            The results of significant diagnostics from this hospitalization (including imaging, microbiology, ancillary and laboratory) are listed below for reference.    Significant Diagnostic Studies: Dg Tibia/fibula Left  Result Date: 02/24/2019 CLINICAL DATA:  Per ems: Pt coming from home c/o fall which resulted in her "left leg getting twisted under her." Pt unable to bend knee but no other deformities noted. EXAM: LEFT TIBIA AND FIBULA - 2 VIEW COMPARISON:  None. FINDINGS: There are nondisplaced fractures of the lateral margin of the lateral tibial plateau and of the proximal fibula as detailed under the left knee radiographs. No other fractures.  Knee and ankle joints are normally aligned. Mild diffuse ankle soft tissue edema. IMPRESSION: 1. Nondisplaced fractures the lateral margin of the lateral tibial plateau and the proximal fibula. 2. No other fractures.  No dislocation. Electronically Signed   By: Lajean Manes M.D.   On: 02/24/2019 22:18   Mr Knee Left Wo Contrast  Result Date: 02/25/2019 CLINICAL DATA:  Left knee twisting injury due to a  fall 02/24/2019. Pain. Initial encounter. EXAM: MRI OF THE LEFT KNEE WITHOUT CONTRAST TECHNIQUE: Multiplanar, multisequence MR imaging of the knee was performed. No intravenous contrast was  administered. COMPARISON:  Plain films left knee 02/24/2019. FINDINGS: MENISCI Medial meniscus: Complex tear in the posterior horn includes a radial component through the meniscal root. Lateral meniscus:  Intact. LIGAMENTS Cruciates:  Intact. Collaterals:  Intact. CARTILAGE Patellofemoral: Thinning is most notable along the lateral femoral trochlea. Medial:  Thinned with associated joint space narrowing. Lateral:  Mildly to moderately degenerated. Joint:  Small effusion. Popliteal Fossa:  Small Baker's cyst. Extensor Mechanism:  Intact. Bones: Acute, comminuted fracture of the fibular head and neck and a fracture of the lateral periphery of the tibia just posterior to the iliotibial band insertion are seen as on plain films. Bulky tricompartmental osteophytes are seen. Other: None. IMPRESSION: Acute fractures of the proximal fibula and periphery of the lateral tibia as seen on plain films today. Complex tear posterior horn medial meniscus includes a radial component through the meniscal root. Negative for lateral meniscus, cruciate or collateral ligament tear. Tricompartmental osteoarthritis. Electronically Signed   By: Inge Rise M.D.   On: 02/25/2019 07:14   Dg Knee Complete 4 Views Left  Result Date: 02/24/2019 CLINICAL DATA:  Per ems: Pt coming from home c/o fall which resulted in her "left leg getting twisted under her." Pt unable to bend knee but no other deformities noted. EXAM: LEFT KNEE - COMPLETE 4+ VIEW COMPARISON:  None. FINDINGS: There is fracture of the proximal fibula extending from the fibular head to the medial proximal shaft, nondisplaced. There is another nondisplaced fracture, from the lateral margin of the lateral tibial plateau, not involving the articular surface. No other fractures. There is  medial compartment joint space narrowing with marginal osteophytes from all 3 compartments. Mild lateral soft tissue edema is noted. IMPRESSION: 1. Nondisplaced fracture of the proximal fibula in from the lateral margin of the lateral tibial plateau. 2. No other acute abnormality. 3. Moderate osteoarthritis. Electronically Signed   By: Lajean Manes M.D.   On: 02/24/2019 22:17   Dg Foot Complete Left  Result Date: 02/24/2019 CLINICAL DATA:  Per ems: Pt coming from home c/o fall which resulted in her "left leg getting twisted under her." Pt unable to bend knee but no other deformities noted. EXAM: LEFT FOOT - COMPLETE 3+ VIEW COMPARISON:  None. FINDINGS: There are fractures of the fourth and fifth metatarsals. No significant fracture displacement. Fractures extend from the shafts to the bases of the metatarsal heads. No other fractures.  The joints are normally aligned. There is forefoot soft tissue swelling. IMPRESSION: 1. Fractures of the mid to distal fourth and fifth metatarsals without significant displacement or angulation. No dislocation. Electronically Signed   By: Lajean Manes M.D.   On: 02/24/2019 22:20    Microbiology: Recent Results (from the past 240 hour(s))  SARS Coronavirus 2 (CEPHEID - Performed in [facility] hospital lab), Hosp Order     Status: None   Collection Time: 02/24/19 11:10 PM   Specimen: Nasopharyngeal Swab  Result Value Ref Range Status   SARS Coronavirus 2 NEGATIVE NEGATIVE Final    Comment: (NOTE) If result is NEGATIVE SARS-CoV-2 target nucleic acids are NOT DETECTED. The SARS-CoV-2 RNA is generally detectable in upper and lower  respiratory specimens during the acute phase of infection. The lowest  concentration of SARS-CoV-2 viral copies this assay can detect is 250  copies / mL. A negative result does not preclude SARS-CoV-2 infection  and should not be used as the sole basis for treatment or other  patient management decisions.  A negative result may occur  with  improper specimen collection / handling, submission of specimen other  than nasopharyngeal swab, presence of viral mutation(s) within the  areas targeted by this assay, and inadequate number of viral copies  (<250 copies / mL). A negative result must be combined with clinical  observations, patient history, and epidemiological information. If result is POSITIVE SARS-CoV-2 target nucleic acids are DETECTED. The SARS-CoV-2 RNA is generally detectable in upper and lower  respiratory specimens dur ing the acute phase of infection.  Positive  results are indicative of active infection with SARS-CoV-2.  Clinical  correlation with patient history and other diagnostic information is  necessary to determine patient infection status.  Positive results do  not rule out bacterial infection or co-infection with other viruses. If result is PRESUMPTIVE POSTIVE SARS-CoV-2 nucleic acids MAY BE PRESENT.   A presumptive positive result was obtained on the submitted specimen  and confirmed on repeat testing.  While 2019 novel coronavirus  (SARS-CoV-2) nucleic acids may be present in the submitted sample  additional confirmatory testing may be necessary for epidemiological  and / or clinical management purposes  to differentiate between  SARS-CoV-2 and other Sarbecovirus currently known to infect humans.  If clinically indicated additional testing with an alternate test  methodology [phone]) is advised. The SARS-CoV-2 RNA is generally  detectable in upper and lower respiratory sp ecimens during the acute  phase of infection. The expected result is Negative. Fact Sheet for Patients:  StrictlyIdeas.no Fact Sheet for Healthcare Providers: BankingDealers.co.za This test is not yet approved or cleared by the [place] FDA and has been authorized for detection and/or diagnosis of SARS-CoV-2 by FDA under an Emergency Use Authorization (EUA).  This EUA  will remain in effect (meaning this test can be used) for the duration of the COVID-19 declaration under Section 564(b)(1) of the Act, 21 U.S.C. section 360bbb-3(b)(1), unless the authorization is terminated or revoked sooner. Performed at [facility], [place] [address] [place], [place] [address]      Labs: Basic Metabolic Panel: Recent Labs  Lab 02/24/19 2334 02/26/19 0334  NA 138 135  K 4.5 4.1  CL 101 97*  CO2 29 29  GLUCOSE 168* 149*  BUN 20 20  CREATININE 1.73* 1.70*  CALCIUM 8.3* 8.7*   Liver Function Tests: Recent Labs  Lab 02/24/19 2334  AST 21  ALT 14  ALKPHOS 96  BILITOT 0.2*  PROT 8.3*  ALBUMIN 3.4*   No results for input(s): LIPASE, AMYLASE in the last 168 hours. No results for input(s): AMMONIA in the last 168 hours. CBC: Recent Labs  Lab 02/24/19 2334 02/26/19 0334  WBC 7.2 9.2  NEUTROABS 4.1  --   HGB 11.1* 10.5*  HCT 35.9* 33.6*  MCV 103.2* 100.0  PLT 189 173   Cardiac Enzymes: No results for input(s): CKTOTAL, CKMB, CKMBINDEX, TROPONINI in the last 168 hours. BNP: BNP (last 3 results) No results for input(s): BNP in the last 8760 hours.  ProBNP (last 3 results) No results for input(s): PROBNP in the last 8760 hours.  CBG: Recent Labs  Lab 02/25/19 1215 02/25/19 1656 02/25/19 2056 02/26/19 0732 02/26/19 1225  GLUCAP 151* 216* 170* 134* 145*       Signed:  Eulogio Bear DO [facility] 02/26/2019, 1:14 PM

## 2019-02-26 NOTE — Progress Notes (Signed)
Physical Therapy Treatment Patient Details Name: Sue [name] MRN: [id number] DOB: 06/23/1951 Today's Date: 02/26/2019    History of Present Illness Pt is a 68 y/o female presenting after a fall, imaging reveals: Segond fracture to the left knee as well as a proximal fibular neck fracture, have minimally displaced comminuted metatarsal fracture left 4 and 5.  MRI negative for multi-ligamentous knee injury. Per Orthopedics- treatment is nonoperative; knee immobilizer and weightbear as tolerated in a hard sole shoe. PMH: TIA, hypothyroidism, HTN, COPD on 3L home O2, CVA, and morbid obesity (BMI 53).     PT Comments    Pt performed gait training progression and performed transfer training with decreased assistance.  Pt is slow and guarded and reports more pain in standing but able to ambulate today.  Pt is eager to return home today.  She continues to require and WC and hospital bed at d/c to improve ease of mobility and promote independence.  Based on improvement of mobility will update recommendations to HHPT with addition of hospital bed.      Follow Up Recommendations  Home health PT;Supervision/Assistance - 24 hour     Equipment Recommendations  Wheelchair (measurements PT);Wheelchair cushion (measurements PT);Hospital bed    Recommendations for Other Services Rehab consult     Precautions / Restrictions Precautions Precautions: Fall Precaution Comments: recent fall at home  Required Braces or Orthoses: Knee Immobilizer - Left;Other Brace(wooden post op shoe) Other Brace: No order but per ortho follow up not patient to wear knee immobilizer and post op shoe on L side "for a few weeks" Restrictions Weight Bearing Restrictions: Yes LLE Weight Bearing: Weight bearing as tolerated Other Position/Activity Restrictions: wearing knee immobilizer and hard sole shoe    Mobility  Bed Mobility Overal bed mobility: Needs Assistance Bed Mobility: Supine to Sit     Supine to sit: Min  assist     General bed mobility comments: Pt able to utilize railing on bed to move to edge of bed with minimal assistance to complete trunk elevation  Transfers Overall transfer level: Needs assistance Equipment used: Rolling walker (2 wheeled) Transfers: Sit to/from Stand Sit to Stand: Min assist;+2 safety/equipment;From elevated surface         General transfer comment: Pt required cues for hand placement and elevation of seat height.  Pt with improved ability to come to standing.  Ambulation/Gait Ambulation/Gait assistance: Min assist;+2 safety/equipment Gait Distance (Feet): 10 Feet Assistive device: Rolling walker (2 wheeled) Gait Pattern/deviations: Step-to pattern;Decreased step length - left;Decreased stance time - left     General Gait Details: Cues for sequencing and progression of gait training.  Pt required close chair follow as she fatigues quickly.   Stairs             Wheelchair Mobility    Modified Rankin (Stroke Patients Only)       Balance Overall balance assessment: Needs assistance Sitting-balance support: No upper extremity supported;Feet supported Sitting balance-Leahy Scale: Fair       Standing balance-Leahy Scale: Fair                              Cognition Arousal/Alertness: Awake/alert Behavior During Therapy: WFL for tasks assessed/performed Overall Cognitive Status: Within Functional Limits for tasks assessed  Exercises      General Comments        Pertinent Vitals/Pain Pain Assessment: 0-10 Pain Score: 4  Pain Location: left knee and foot  Pain Descriptors / Indicators: Aching;Guarding;Grimacing;Discomfort Pain Intervention(s): Monitored during session;Repositioned    Home Living                      Prior Function            PT Goals (current goals can now be found in the care plan section) Acute Rehab PT Goals Patient Stated Goal:  to go home  Potential to Achieve Goals: Good    Frequency    Min 3X/week      PT Plan Discharge plan needs to be updated    Co-evaluation              AM-PAC PT "6 Clicks" Mobility   Outcome Measure  Help needed turning from your back to your side while in a flat bed without using bedrails?: A Little Help needed moving from lying on your back to sitting on the side of a flat bed without using bedrails?: A Little Help needed moving to and from a bed to a chair (including a wheelchair)?: A Little Help needed standing up from a chair using your arms (e.g., wheelchair or bedside chair)?: A Little Help needed to walk in hospital room?: A Little Help needed climbing 3-5 steps with a railing? : A Lot 6 Click Score: 17    End of Session Equipment Utilized During Treatment: Gait belt Activity Tolerance: Patient limited by pain Patient left: in bed;with call bell/phone within reach;with bed alarm set Nurse Communication: Mobility status PT Visit Diagnosis: Unsteadiness on feet (R26.81);Repeated falls (R29.6);Muscle weakness (generalized) (M62.81)     Time: 3790-2409 PT Time Calculation (min) (ACUTE ONLY): 23 min  Charges:  $Gait Training: 8-22 mins $Therapeutic Activity: 8-22 mins                     Governor Rooks, PTA Acute Rehabilitation Services Pager [phone] Office [phone]     Anett Ranker Eli Hose 02/26/2019, 1:32 PM

## 2019-02-26 NOTE — Plan of Care (Signed)

## 2019-02-26 NOTE — TOC Transition Note (Signed)
Transition of Care [facility]) - CM/SW Discharge Note   Patient Details  Name: Sue [name] MRN: [id number] Date of Birth: [birth year]  Transition of Care [facility] CM/SW Contact:  Marilu Favre, RN Phone Number: 02/26/2019, 10:38 AM   Clinical Narrative:     Patient to patient at bedside and daughter via telephone.   Patient continues to decline SNF placement.  Home health arranged through [place], both understand  Home health will not make daily visits or stay for long periods of time.   DME orders given to [facility] with [employer]. Patient states she does not need DME delivered before discharge. Patient plans on her son and grandson coming to transport her home via private car. They will bring her portable oxygen tank. Patient states son and grandson can get her up her steps at home, she does not want PTAR transport .   Final next level of care: [place] Barriers to Discharge: No Barriers Identified   Patient Goals and CMS Choice Patient states their goals for this hospitalization and ongoing recovery are:: to go home CMS Medicare.gov Compare Post Acute Care list provided to:: Patient Choice offered to / list presented to : Patient  Discharge Placement                       Discharge Plan and Services   Discharge Planning Services: CM Consult            DME Arranged: Gilford Rile rolling, Hospital bed DME Agency: [employer] Date DME Agency Contacted: 02/26/19 Time DME Agency Contacted: [id number] Representative spoke with at DME Agency: [place]: RN, PT, OT, Nurse's Aide [place] Agency: [place] ([place]) Date [place]: 02/26/19 Time [place]: 1036 Representative spoke with at [place]: [place] ([place]) Interventions     Readmission Risk Interventions No flowsheet data found.

## 2019-02-27 ENCOUNTER — Ambulatory Visit (INDEPENDENT_AMBULATORY_CARE_PROVIDER_SITE_OTHER): Payer: Medicare HMO | Admitting: Medical

## 2019-02-27 ENCOUNTER — Telehealth: Payer: Self-pay | Admitting: *Deleted

## 2019-02-27 ENCOUNTER — Other Ambulatory Visit: Payer: Self-pay

## 2019-02-27 DIAGNOSIS — M79605 Pain in left leg: Secondary | ICD-10-CM | POA: Diagnosis not present

## 2019-02-27 DIAGNOSIS — M79672 Pain in left foot: Secondary | ICD-10-CM | POA: Diagnosis not present

## 2019-02-27 DIAGNOSIS — K5900 Constipation, unspecified: Secondary | ICD-10-CM | POA: Diagnosis not present

## 2019-02-27 MED ORDER — TRAMADOL HCL 50 MG PO TABS: 50.0000 mg | ORAL_TABLET | Freq: Two times a day (BID) | ORAL | 0 refills | Status: AC | PRN

## 2019-02-27 NOTE — Patient Instructions (Signed)
Patient has follow-up from the ED and hospital for left tibia plateau fracture, torn meniscus and per her report she was still had some toe fractures as well.  Patient's had pain since fracture and states pain features are the same as when she was discharged from the hospital.  I did express to patient limited features of video visits and it appears office visit would be challenge as well.  Patient has no popliteal pain during interview and no calf pain.  She does report pain that radiates from the foot towards the left anterior thigh area.  Pain is in the front region and not in the popliteal area.  No report of any respiratory changes although she does have baseline COPD and is on oxygen.  Extensive counseling with patient on watching for any popliteal pain, left calf swelling or any change in breathing.  If any of these were to recur then recommend ED evaluation as she has been immobile and based on current condition and comorbidities will likely remain in bed most of the day.  Patient did not check her vitals today but she has home health coming by tomorrow.  Asked her to have them check her blood pressure, pulse and oxygen saturation percentage.  Call those values to our office.  Also asked her to get O2 sat monitor from pharmacy so she can check her numbers at the house.  If she has any dramatic drop below 90% or dramatic change from her baseline then recommend ED evaluation as well.  Patient does have constipation and it coincided with the use of Vicodin.  Also some side effects of nausea and vomited once with Vicodin.  Recommend patient use MiraLAX every other day and stay off of Vicodin.  Will switch her to tramadol and advised to just use 1 tablet every 12 hours.   Last receptionist staff to schedule pt with pcp on Monday or Tuesday. Will ask Shekeitha MA of pcp to call pt and see how pt doing.

## 2019-02-27 NOTE — Progress Notes (Signed)
Subjective:    Patient ID: Sue [name], female    DOB: 05/02/1951, 68 y.o.   MRN: [id number]  Virtual Visit via Video Note  I connected with Sue [name] on 02/27/19 at  3:40 PM EDT by a video enabled telemedicine application and verified that I am speaking with the correct person using two identifiers.  Location: Patient: home Provider: home  Pulse does not have pulse ox monitor-    I discussed the limitations of evaluation and management by telemedicine and the availability of in person appointments. The patient expressed understanding and agreed to proceed.    History of Present Illness:  History of present illness:  Sue [name] Hammondis a 68 y.o.femalewith medical history significant ofTIA; hypothyroidism; HTN; COPD; and morbid obesity (BMI 53) presented 6/15 with a fall.She tripped over her right foot and fell backwards and could not get off the floor. Her left foot went behind her right and her knee twisted and she heard it snap. She was in her bedroom and was able to crawl to thephoneand called her son.   Hospital Course:  L tib/fib fracture with meniscal tear; metatarsal fractures -evaluated by ortho who opined fibular head fracture, okay for knee immobilizer for 1-2 weeks to help with stability -Metatarsal fractures - needs a hard sole shoe -Can weight bear as tolerated, unlikely to be able to do much due to pain.physical therapy evaluated and recommended snf and patient refused -sent home with [facility] and wheelchair and hospital bed  Chronic respiratory failure associated with COPD due to hypoxia -On home O2, 3L. Stable at baseline.  DM type 2 A1c 6.7 today. Improved from 10.6 6 months ago. Close OP follow up  Hypothyroidism. tsh 7.3. OP follow up  Morbid obesity, BMI 53 Consider outpatient bariatric medicine and/or surgery consults  HTN fair control  Stage 3-4 CKD -Needs good glycemic and BP control to decrease the risk of further  progression   Procedures:    Consultations:   Pt states for her injuries she did not need surgery. Pt put in knee immobilizer. Not in cast. Pt is in hospital bed at home.   Pt stats she has some foot pain that shoot up to front of her knee. Some pain radiates to thigh mid aspect.   Pt has toe pain as well due to fracture.  Pt states she can't put any pressure on leg due to pain.   Pt states she has had the above pain before she left ED.   Pt has been on vicodin for pain. Pt states it made her nausea, vomit one time and no bm for 3 days.  She stopped vicodin yesterday.  Pt has no popliteal pain. Left calf symmetric to rt side. She has no chest pain and on acute sudden change in breathing. No wheezing or sob beyond her normal copd for which she uses o2.   Past Medical History:  Diagnosis Date   Allergic rhinitis    Depression    Emphysema of lung (HCC)    3L home O2   GERD (gastroesophageal reflux disease)    Hypertension    Hypothyroidism    Stroke ([place]) 2016   TIA    Urine incontinence      Social History   Socioeconomic History   Marital status: Divorced    Spouse name: Not on file   Number of children: Not on file   Years of education: Not on file   Highest education level: Not on file  Occupational History   Occupation: disabled- Occupational hygienist Med  Scientist, product/process development strain: Not on file   Food insecurity    Worry: Not on file    Inability: Not on file   Transportation needs    Medical: Not on file    Non-medical: Not on file  Tobacco Use   Smoking status: Former Smoker    Packs/day: 1.00    Years: 40.00    Pack years: 40.00    Types: Cigarettes    Start date: 07/21/1972    Quit date: 10/16/2011    Years since quitting: 7.3   Smokeless tobacco: Never Used  Substance and Sexual Activity   Alcohol use: Not Currently    Comment: Occ-- Wine   Drug use: No   Sexual activity: Not on file  Lifestyle   Physical  activity    Days per week: Not on file    Minutes per session: Not on file   Stress: Not on file  Relationships   Social connections    Talks on phone: Not on file    Gets together: Not on file    Attends religious service: Not on file    Active member of club or organization: Not on file    Attends meetings of clubs or organizations: Not on file    Relationship status: Not on file   Intimate partner violence    Fear of current or ex partner: Not on file    Emotionally abused: Not on file    Physically abused: Not on file    Forced sexual activity: Not on file  Other Topics Concern   Not on file  Social History Narrative   [facility] (04/06/17):   Originally from [place]. Has also lived in [place], [place]. She also previously lived in [place] for 20 years. No history of [place]. Moved to [place] in 1989. 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 No pets currently. No bird exposure. Questionable previous mold exposure in her daughter's home. 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     Past Surgical History:  Procedure Laterality Date   ABDOMINAL HYSTERECTOMY     CESAREAN SECTION      Family History  Problem Relation Age of Onset   Heart disease Father        MVP and Pics Valve   Hypertension Father    Depression Father        Institutionalized x's 2 years   Bipolar disorder Father    Hypertension Sister    Diabetes Sister    Hyperlipidemia Sister    Heart disease Sister 52       MI   Heart disease Brother    Hypertension Brother    Heart disease Paternal Grandmother    Heart disease Paternal Aunt    Heart disease Paternal Uncle    Schizophrenia Paternal Aunt    Asthma Son    Asthma Son     Allergies  Allergen Reactions   Norvasc  [Amlodipine Besylate]     Marked swelling    Current Outpatient Medications on File Prior to Visit  Medication Sig Dispense Refill   albuterol (PROVENTIL HFA;VENTOLIN HFA) 108 (90 Base) MCG/ACT inhaler Inhale 1-2 puffs into the lungs every 6 (six) hours as needed  for wheezing or shortness of breath.     albuterol (PROVENTIL) (2.5 MG/3ML) 0.083% nebulizer solution Take 3 mLs (2.5 mg total) by nebulization every 6 (six) hours as needed for wheezing or shortness of breath. 150 mL 1   blood glucose meter kit and supplies KIT Dispense based on patient and insurance preference. Use up to four times daily as directed. (FOR ICD-9 250.00, 250.01). 1 each 0   busPIRone (BUSPAR) 15 MG tablet Take 15 mg by mouth 2 (two) times daily.   4   famotidine (PEPCID) 20 MG tablet Take 20 mg by mouth daily as needed for heartburn or indigestion.     fluticasone (FLONASE) 50 MCG/ACT nasal spray Place 2 sprays into both nostrils daily as needed for allergies.     glipiZIDE (GLUCOTROL) 5 MG tablet Take 0.5 tablets (2.5 mg total) by mouth 2 (two) times daily before a meal. 30 tablet 11   HYDROcodone-acetaminophen (NORCO/VICODIN) 5-325 MG tablet Take 1-2 tablets by mouth every 6 (six) hours as needed for moderate pain. 10 tablet 0   insulin glargine (LANTUS) 100 UNIT/ML injection Inject 0.3 mLs (30 Units total) into the skin at bedtime. 10 mL 5   Insulin Syringe-Needle U-100 28G X 5/16" 0.5 ML MISC Use with lantus once a day 100 each 1   levothyroxine (SYNTHROID) 50 MCG tablet Take 1 tablet (50 mcg total) by mouth daily. 90 tablet 0   LORazepam (ATIVAN) 2 MG tablet Take 0.5-1 mg by mouth as directed. 0.5 Mg in the afternoon and 1 MG at bedtime 30 tablet 0   metFORMIN (GLUCOPHAGE) 500 MG tablet Take 2 tablets (1,000 mg total) by mouth 2 (two) times daily with a meal. 120 tablet 11   methocarbamol (ROBAXIN) 500 MG tablet Take 1 tablet (500 mg total) by mouth every 6 (six) hours as needed for muscle spasms. 10  tablet 0   metoprolol succinate (TOPROL-XL) 50 MG 24 hr tablet TAKE 2 TABLETS BY MOUTH ONCE DAILY IN THE MORNING WITH A MEAL OR IMMEDIATELY FOLLOWING A MEAL 180 tablet 1   OXYGEN Inhale 3 L into the lungs continuous.      potassium chloride SA (K-DUR) 20 MEQ tablet Take 1 tablet (20 mEq total) by mouth daily. 30 tablet 0   QUEtiapine (SEROQUEL) 100 MG tablet Take 100 mg by mouth at bedtime.     torsemide (DEMADEX) 20 MG tablet Take 1 tablet (20 mg total) by mouth daily. (Patient taking differently: Take 40 mg by mouth daily. Taking 2 tablets(72m) daily) 180 tablet 3   zolpidem (AMBIEN) 5 MG tablet Take 2 tablets (10 mg total) by mouth at bedtime.     No current facility-administered medications on file prior to visit.     There were no vitals taken for this visit.      Observations/Objective: General-no acute distress, pleasant, oriented. Lungs- on inspection lungs appear unlabored. Neck- no tracheal deviation or jvd on inspection. Neuro- gross motor function appears intact. Left calf/lower ext- normal in color compared to rt side. No swelling symmetric on inspection. No popliteal pain when daughter palpates.(not very good comparison view by video today) no calf pain on palpation.  Assessment and Plan: Patient has follow-up from the ED and hospital for left tibia plateau fracture, torn meniscus and per her report she was still had some toe fractures as well.  Patient's had pain since fracture and states pain features are the same as when she was discharged from the hospital.  I did express to patient  limited features of video visits and it appears office visit would be challenge as well.  Patient has no popliteal pain during interview and no calf pain.  She does report pain that radiates from the foot towards the left anterior thigh area.  Pain is in the front region and not in the popliteal area.  No report of any respiratory changes although she does have baseline COPD and is on  oxygen.  Extensive counseling with patient on watching for any popliteal pain, left calf swelling or any change in breathing.  If any of these were to recur then recommend ED evaluation as she has been immobile and based on current condition and comorbidities will likely remain in bed most of the day.  Patient did not check her vitals today but she has home health coming by tomorrow.  Asked her to have them check her blood pressure, pulse and oxygen saturation percentage.  Call those values to our office.  Also asked her to get O2 sat monitor from pharmacy so she can check her numbers at the house.  If she has any dramatic drop below 90% or dramatic change from her baseline then recommend ED evaluation as well.  Patient does have constipation and it coincided with the use of Vicodin.  Also some side effects of nausea and vomited once with Vicodin.  Recommend patient use MiraLAX every other day and stay off of Vicodin.  Will switch her to tramadol and advised to just use 1 tablet every 12 hours.   Last receptionist staff to schedule pt with pcp on Monday or Tuesday. Will ask Shekeitha MA of pcp to call pt and see how pt doing.  Follow Up Instructions:    I discussed the assessment and treatment plan with the patient. The patient was provided an opportunity to ask questions and all were answered. The patient agreed with the plan and demonstrated an understanding of the instructions.   The patient was advised to call back or seek an in-person evaluation if the symptoms worsen or if the condition fails to improve as anticipated.  I provided 25 + minutes of non-face-to-face time during this encounter.   Mackie Pai, PA-C

## 2019-02-27 NOTE — Telephone Encounter (Signed)
Pt had virtual visit today.

## 2019-02-27 NOTE — Telephone Encounter (Signed)
Transition Care Management Follow-up Telephone Call   Date discharged?02/26/19   How have you been since you were released from the hospital? "still in pain"    Do you understand why you were in the hospital? yes   Do you understand the discharge instructions? yes   Where were you discharged to? home   Items Reviewed:  Medications reviewed: pt states she can't get to list at this time  Allergies reviewed: yes  Dietary changes reviewed: yes  Referrals reviewed: yes   Functional Questionnaire:   Activities of Daily Living (ADLs):   She states they are independent in the following: ambulation, bathing and hygiene, feeding, continence, grooming, toileting and dressing States they require assistance with the following: daughter lives her and will help as needed. uses wheelchair and rolling walker.   Any transportation issues/concerns?: no   Any patient concerns? Will discuss pain at appt this afternoon   Confirmed importance and date/time of follow-up visits scheduled yes  Provider Appointment booked with Percell Miller PA at [phone]  Confirmed with patient if condition begins to worsen call PCP or go to the ER.  Patient was given the office number and encouraged to call back with question or concerns.  : yes

## 2019-03-02 DIAGNOSIS — S89202D Unspecified physeal fracture of upper end of left fibula, subsequent encounter for fracture with routine healing: Secondary | ICD-10-CM | POA: Diagnosis not present

## 2019-03-02 DIAGNOSIS — E119 Type 2 diabetes mellitus without complications: Secondary | ICD-10-CM | POA: Diagnosis not present

## 2019-03-02 DIAGNOSIS — S83242D Other tear of medial meniscus, current injury, left knee, subsequent encounter: Secondary | ICD-10-CM | POA: Diagnosis not present

## 2019-03-02 DIAGNOSIS — W0110XD Fall on same level from slipping, tripping and stumbling with subsequent striking against unspecified object, subsequent encounter: Secondary | ICD-10-CM | POA: Diagnosis not present

## 2019-03-02 DIAGNOSIS — N183 Chronic kidney disease, stage 3 (moderate): Secondary | ICD-10-CM | POA: Diagnosis not present

## 2019-03-02 DIAGNOSIS — I129 Hypertensive chronic kidney disease with stage 1 through stage 4 chronic kidney disease, or unspecified chronic kidney disease: Secondary | ICD-10-CM | POA: Diagnosis not present

## 2019-03-02 DIAGNOSIS — S82142D Displaced bicondylar fracture of left tibia, subsequent encounter for closed fracture with routine healing: Secondary | ICD-10-CM | POA: Diagnosis not present

## 2019-03-02 DIAGNOSIS — S92332D Displaced fracture of third metatarsal bone, left foot, subsequent encounter for fracture with routine healing: Secondary | ICD-10-CM | POA: Diagnosis not present

## 2019-03-02 DIAGNOSIS — S92342D Displaced fracture of fourth metatarsal bone, left foot, subsequent encounter for fracture with routine healing: Secondary | ICD-10-CM | POA: Diagnosis not present

## 2019-03-04 ENCOUNTER — Telehealth: Payer: Self-pay

## 2019-03-04 ENCOUNTER — Telehealth: Payer: Self-pay | Admitting: Family Medicine

## 2019-03-04 DIAGNOSIS — S83242D Other tear of medial meniscus, current injury, left knee, subsequent encounter: Secondary | ICD-10-CM | POA: Diagnosis not present

## 2019-03-04 DIAGNOSIS — W0110XD Fall on same level from slipping, tripping and stumbling with subsequent striking against unspecified object, subsequent encounter: Secondary | ICD-10-CM | POA: Diagnosis not present

## 2019-03-04 DIAGNOSIS — S82142D Displaced bicondylar fracture of left tibia, subsequent encounter for closed fracture with routine healing: Secondary | ICD-10-CM | POA: Diagnosis not present

## 2019-03-04 DIAGNOSIS — S92332D Displaced fracture of third metatarsal bone, left foot, subsequent encounter for fracture with routine healing: Secondary | ICD-10-CM | POA: Diagnosis not present

## 2019-03-04 DIAGNOSIS — S89202D Unspecified physeal fracture of upper end of left fibula, subsequent encounter for fracture with routine healing: Secondary | ICD-10-CM | POA: Diagnosis not present

## 2019-03-04 DIAGNOSIS — I129 Hypertensive chronic kidney disease with stage 1 through stage 4 chronic kidney disease, or unspecified chronic kidney disease: Secondary | ICD-10-CM | POA: Diagnosis not present

## 2019-03-04 DIAGNOSIS — E119 Type 2 diabetes mellitus without complications: Secondary | ICD-10-CM | POA: Diagnosis not present

## 2019-03-04 DIAGNOSIS — S92342D Displaced fracture of fourth metatarsal bone, left foot, subsequent encounter for fracture with routine healing: Secondary | ICD-10-CM | POA: Diagnosis not present

## 2019-03-04 DIAGNOSIS — N183 Chronic kidney disease, stage 3 (moderate): Secondary | ICD-10-CM | POA: Diagnosis not present

## 2019-03-04 NOTE — Telephone Encounter (Signed)
Copied from [place] [id number]. Topic: General - Other >> Mar 01, 2019  4:24 PM Yvette Rack wrote: Reason for CRM: Pt stated she is experiencing constipation and she would like a Rx for Linzess sent to [place], [place] [phone] (Phone)  [phone] (Fax)

## 2019-03-04 NOTE — Telephone Encounter (Signed)
Spoke w/ Sharyn Lull, verbal orders given.

## 2019-03-04 NOTE — Telephone Encounter (Signed)
Can you help set Pt up with virtual visit or in person with PCP please?

## 2019-03-04 NOTE — Telephone Encounter (Signed)
Scheduled 6/23 11:45am

## 2019-03-04 NOTE — Telephone Encounter (Signed)
Home Health Verbal Orders - Caller/Agency: [facility] Callback Number: [phone] Requesting OT/PT/Skilled Nursing/Social Work/Speech Therapy: Home health aid and skilled nursing Frequency: Weekly until 04/30/2019

## 2019-03-04 NOTE — Telephone Encounter (Signed)
Not on pt med list-- will need ov --- either in person or video

## 2019-03-05 ENCOUNTER — Encounter: Payer: Self-pay | Admitting: Family Medicine

## 2019-03-05 ENCOUNTER — Other Ambulatory Visit: Payer: Self-pay

## 2019-03-05 ENCOUNTER — Telehealth: Payer: Self-pay | Admitting: Family Medicine

## 2019-03-05 ENCOUNTER — Ambulatory Visit (INDEPENDENT_AMBULATORY_CARE_PROVIDER_SITE_OTHER): Payer: Medicare HMO | Admitting: Family Medicine

## 2019-03-05 VITALS — BP 116/68 | Temp 98.1°F | Ht 62.0 in | Wt 300.0 lb

## 2019-03-05 DIAGNOSIS — K5903 Drug induced constipation: Secondary | ICD-10-CM | POA: Diagnosis not present

## 2019-03-05 DIAGNOSIS — R1114 Bilious vomiting: Secondary | ICD-10-CM | POA: Diagnosis not present

## 2019-03-05 MED ORDER — LINACLOTIDE 145 MCG PO CAPS: 145.0000 ug | ORAL_CAPSULE | Freq: Every day | ORAL | 0 refills | Status: DC

## 2019-03-05 MED ORDER — ONDANSETRON HCL 4 MG PO TABS: 4.0000 mg | ORAL_TABLET | Freq: Three times a day (TID) | ORAL | 0 refills | Status: DC | PRN

## 2019-03-05 NOTE — Progress Notes (Signed)
Virtual Visit via Video Note  I connected with Sue [name] on 03/05/19 at 11:45 AM EDT by a video enabled telemedicine application and verified that I am speaking with the correct person using two identifiers.  Location: Patient: home Provider: office    I discussed the limitations of evaluation and management by telemedicine and the availability of in person appointments. The patient expressed understanding and agreed to proceed.  History of Present Illness: Pt is home c/o constipation and she was requesting linzess-- she has tried 3 laxatives and mag citrate with no results.  She has been on pain meds since surgery.  Stopped them because she does not like the way she feels with them but she is still constipated.  She started vomiting today about 2 hours ago--- green liquid.  No fevers.  No diarrhea   No abd pain   Past Medical History:  Diagnosis Date  . Allergic rhinitis   . Depression   . Emphysema of lung ([place])    3L home O2  . GERD (gastroesophageal reflux disease)   . Hypertension   . Hypothyroidism   . Stroke [facility]) 2016   TIA   . Urine incontinence    Current Outpatient Medications on File Prior to Visit  Medication Sig Dispense Refill  . albuterol (PROVENTIL HFA;VENTOLIN HFA) 108 (90 Base) MCG/ACT inhaler Inhale 1-2 puffs into the lungs every 6 (six) hours as needed for wheezing or shortness of breath.    Marland Kitchen albuterol (PROVENTIL) (2.5 MG/3ML) 0.083% nebulizer solution Take 3 mLs (2.5 mg total) by nebulization every 6 (six) hours as needed for wheezing or shortness of breath. 150 mL 1  . blood glucose meter kit and supplies KIT Dispense based on patient and insurance preference. Use up to four times daily as directed. (FOR ICD-9 250.00, 250.01). 1 each 0  . busPIRone (BUSPAR) 15 MG tablet Take 15 mg by mouth 2 (two) times daily.   4  . famotidine (PEPCID) 20 MG tablet Take 20 mg by mouth daily as needed for heartburn or indigestion.    . fluticasone (FLONASE) 50 MCG/ACT  nasal spray Place 2 sprays into both nostrils daily as needed for allergies.    Marland Kitchen glipiZIDE (GLUCOTROL) 5 MG tablet Take 0.5 tablets (2.5 mg total) by mouth 2 (two) times daily before a meal. 30 tablet 11  . insulin glargine (LANTUS) 100 UNIT/ML injection Inject 0.3 mLs (30 Units total) into the skin at bedtime. 10 mL 5  . Insulin Syringe-Needle U-100 28G X 5/16" 0.5 ML MISC Use with lantus once a day 100 each 1  . levothyroxine (SYNTHROID) 50 MCG tablet Take 1 tablet (50 mcg total) by mouth daily. 90 tablet 0  . LORazepam (ATIVAN) 2 MG tablet Take 0.5-1 mg by mouth as directed. 0.5 Mg in the afternoon and 1 MG at bedtime 30 tablet 0  . metFORMIN (GLUCOPHAGE) 500 MG tablet Take 2 tablets (1,000 mg total) by mouth 2 (two) times daily with a meal. 120 tablet 11  . methocarbamol (ROBAXIN) 500 MG tablet Take 1 tablet (500 mg total) by mouth every 6 (six) hours as needed for muscle spasms. 10 tablet 0  . metoprolol succinate (TOPROL-XL) 50 MG 24 hr tablet TAKE 2 TABLETS BY MOUTH ONCE DAILY IN THE MORNING WITH A MEAL OR IMMEDIATELY FOLLOWING A MEAL 180 tablet 1  . OXYGEN Inhale 3 L into the lungs continuous.     . potassium chloride SA (K-DUR) 20 MEQ tablet Take 1 tablet (20 mEq total)  by mouth daily. 30 tablet 0  . QUEtiapine (SEROQUEL) 100 MG tablet Take 100 mg by mouth at bedtime.    . torsemide (DEMADEX) 20 MG tablet Take 1 tablet (20 mg total) by mouth daily. (Patient taking differently: Take 40 mg by mouth daily. Taking 2 tablets(62m) daily) 180 tablet 3  . zolpidem (AMBIEN) 5 MG tablet Take 2 tablets (10 mg total) by mouth at bedtime.     No current facility-administered medications on file prior to visit.     Observations/Objective: Today's Vitals   03/05/19 1139  BP: 116/68  Temp: 98.1 F (36.7 C)  Weight: 300 lb (136.1 kg)  Height: _0  (1.575 m)   Body mass index is 54.87 kg/m. Pt is in NAD  Assessment and Plan: 1. Drug-induced constipation Pt has tried otc lax including mag  citrate with no relief Can try linzess but if no results ---  Go to ER  - linaclotide (LINZESS) 145 MCG CAPS capsule; Take 1 capsule (145 mcg total) by mouth daily before breakfast.  Dispense: 30 capsule; Refill: 0  2. Bilious vomiting with nausea zofran sent in If vomiting con't may have obstruction--- pt instructed to go to er if symptoms con't She understands and agrees - ondansetron (ZOFRAN) 4 MG tablet; Take 1 tablet (4 mg total) by mouth every 8 (eight) hours as needed for nausea or vomiting.  Dispense: 20 tablet; Refill: 0   Follow Up Instructions:    I discussed the assessment and treatment plan with the patient. The patient was provided an opportunity to ask questions and all were answered. The patient agreed with the plan and demonstrated an understanding of the instructions.   The patient was advised to call back or seek an in-person evaluation if the symptoms worsen or if the condition fails to improve as anticipated.  I provided 15 minutes of non-face-to-face time during this encounter.   YAnn Held DO

## 2019-03-05 NOTE — Telephone Encounter (Signed)
Spoke w/ Merry Proud- verbal orders given.

## 2019-03-05 NOTE — Telephone Encounter (Signed)
Merry Proud with [facility] requesting home health PT orders 2 wk 2 1 wk 6  cb [phone]

## 2019-03-07 ENCOUNTER — Telehealth: Payer: Self-pay | Admitting: Family Medicine

## 2019-03-07 DIAGNOSIS — I10 Essential (primary) hypertension: Secondary | ICD-10-CM

## 2019-03-07 DIAGNOSIS — S82102A Unspecified fracture of upper end of left tibia, initial encounter for closed fracture: Secondary | ICD-10-CM

## 2019-03-07 DIAGNOSIS — J9611 Chronic respiratory failure with hypoxia: Secondary | ICD-10-CM

## 2019-03-07 NOTE — Telephone Encounter (Signed)
Spoke w/ Sue [name]- Pt requesting to switch from [facility] to [facility]. They are needing referral placed.

## 2019-03-07 NOTE — Telephone Encounter (Signed)
Home Health Verbal Orders - Caller/Agency: [facility] Number: [phone] Requesting OT/PT/Skilled Nursing/Social - requesting orders for home health care through [facility].

## 2019-03-08 ENCOUNTER — Other Ambulatory Visit: Payer: Self-pay

## 2019-03-08 ENCOUNTER — Inpatient Hospital Stay (HOSPITAL_COMMUNITY)
Admission: EM | Admit: 2019-03-08 | Discharge: 2019-03-12 | DRG: 389 | Disposition: A | Discharge status: Home / Self Care | Payer: Medicare HMO | Attending: Internal Medicine | Admitting: Internal Medicine

## 2019-03-08 ENCOUNTER — Emergency Department (HOSPITAL_COMMUNITY): Payer: Medicare HMO

## 2019-03-08 ENCOUNTER — Encounter (HOSPITAL_COMMUNITY): Payer: Self-pay | Admitting: Emergency Medicine

## 2019-03-08 DIAGNOSIS — Z8249 Family history of ischemic heart disease and other diseases of the circulatory system: Secondary | ICD-10-CM | POA: Diagnosis not present

## 2019-03-08 DIAGNOSIS — S82453D Displaced comminuted fracture of shaft of unspecified fibula, subsequent encounter for closed fracture with routine healing: Secondary | ICD-10-CM

## 2019-03-08 DIAGNOSIS — Z0189 Encounter for other specified special examinations: Secondary | ICD-10-CM

## 2019-03-08 DIAGNOSIS — K6389 Other specified diseases of intestine: Secondary | ICD-10-CM | POA: Diagnosis not present

## 2019-03-08 DIAGNOSIS — R1084 Generalized abdominal pain: Secondary | ICD-10-CM | POA: Diagnosis not present

## 2019-03-08 DIAGNOSIS — J9610 Chronic respiratory failure, unspecified whether with hypoxia or hypercapnia: Secondary | ICD-10-CM | POA: Diagnosis present

## 2019-03-08 DIAGNOSIS — D539 Nutritional anemia, unspecified: Secondary | ICD-10-CM | POA: Diagnosis present

## 2019-03-08 DIAGNOSIS — K567 Ileus, unspecified: Secondary | ICD-10-CM | POA: Diagnosis not present

## 2019-03-08 DIAGNOSIS — K56609 Unspecified intestinal obstruction, unspecified as to partial versus complete obstruction: Secondary | ICD-10-CM | POA: Diagnosis present

## 2019-03-08 DIAGNOSIS — Z1159 Encounter for screening for other viral diseases: Secondary | ICD-10-CM

## 2019-03-08 DIAGNOSIS — E118 Type 2 diabetes mellitus with unspecified complications: Secondary | ICD-10-CM | POA: Diagnosis not present

## 2019-03-08 DIAGNOSIS — Z8349 Family history of other endocrine, nutritional and metabolic diseases: Secondary | ICD-10-CM

## 2019-03-08 DIAGNOSIS — I440 Atrioventricular block, first degree: Secondary | ICD-10-CM | POA: Diagnosis not present

## 2019-03-08 DIAGNOSIS — Z79890 Hormone replacement therapy (postmenopausal): Secondary | ICD-10-CM

## 2019-03-08 DIAGNOSIS — Z825 Family history of asthma and other chronic lower respiratory diseases: Secondary | ICD-10-CM

## 2019-03-08 DIAGNOSIS — E1169 Type 2 diabetes mellitus with other specified complication: Secondary | ICD-10-CM | POA: Diagnosis not present

## 2019-03-08 DIAGNOSIS — G4700 Insomnia, unspecified: Secondary | ICD-10-CM | POA: Diagnosis present

## 2019-03-08 DIAGNOSIS — S89202D Unspecified physeal fracture of upper end of left fibula, subsequent encounter for fracture with routine healing: Secondary | ICD-10-CM | POA: Diagnosis not present

## 2019-03-08 DIAGNOSIS — K219 Gastro-esophageal reflux disease without esophagitis: Secondary | ICD-10-CM | POA: Diagnosis not present

## 2019-03-08 DIAGNOSIS — I129 Hypertensive chronic kidney disease with stage 1 through stage 4 chronic kidney disease, or unspecified chronic kidney disease: Secondary | ICD-10-CM | POA: Diagnosis not present

## 2019-03-08 DIAGNOSIS — I82492 Acute embolism and thrombosis of other specified deep vein of left lower extremity: Secondary | ICD-10-CM | POA: Diagnosis not present

## 2019-03-08 DIAGNOSIS — Z03818 Encounter for observation for suspected exposure to other biological agents ruled out: Secondary | ICD-10-CM | POA: Diagnosis not present

## 2019-03-08 DIAGNOSIS — J449 Chronic obstructive pulmonary disease, unspecified: Secondary | ICD-10-CM

## 2019-03-08 DIAGNOSIS — J309 Allergic rhinitis, unspecified: Secondary | ICD-10-CM | POA: Diagnosis present

## 2019-03-08 DIAGNOSIS — F419 Anxiety disorder, unspecified: Secondary | ICD-10-CM | POA: Diagnosis present

## 2019-03-08 DIAGNOSIS — E1122 Type 2 diabetes mellitus with diabetic chronic kidney disease: Secondary | ICD-10-CM | POA: Diagnosis present

## 2019-03-08 DIAGNOSIS — W19XXXD Unspecified fall, subsequent encounter: Secondary | ICD-10-CM | POA: Diagnosis present

## 2019-03-08 DIAGNOSIS — K8020 Calculus of gallbladder without cholecystitis without obstruction: Secondary | ICD-10-CM | POA: Diagnosis not present

## 2019-03-08 DIAGNOSIS — I82432 Acute embolism and thrombosis of left popliteal vein: Secondary | ICD-10-CM | POA: Diagnosis present

## 2019-03-08 DIAGNOSIS — Z87891 Personal history of nicotine dependence: Secondary | ICD-10-CM

## 2019-03-08 DIAGNOSIS — R52 Pain, unspecified: Secondary | ICD-10-CM | POA: Diagnosis not present

## 2019-03-08 DIAGNOSIS — J9601 Acute respiratory failure with hypoxia: Secondary | ICD-10-CM | POA: Diagnosis not present

## 2019-03-08 DIAGNOSIS — S92332D Displaced fracture of third metatarsal bone, left foot, subsequent encounter for fracture with routine healing: Secondary | ICD-10-CM | POA: Diagnosis not present

## 2019-03-08 DIAGNOSIS — E039 Hypothyroidism, unspecified: Secondary | ICD-10-CM | POA: Diagnosis present

## 2019-03-08 DIAGNOSIS — Z794 Long term (current) use of insulin: Secondary | ICD-10-CM

## 2019-03-08 DIAGNOSIS — F418 Other specified anxiety disorders: Secondary | ICD-10-CM | POA: Diagnosis present

## 2019-03-08 DIAGNOSIS — Z8673 Personal history of transient ischemic attack (TIA), and cerebral infarction without residual deficits: Secondary | ICD-10-CM

## 2019-03-08 DIAGNOSIS — W0110XD Fall on same level from slipping, tripping and stumbling with subsequent striking against unspecified object, subsequent encounter: Secondary | ICD-10-CM | POA: Diagnosis not present

## 2019-03-08 DIAGNOSIS — K56600 Partial intestinal obstruction, unspecified as to cause: Secondary | ICD-10-CM | POA: Diagnosis not present

## 2019-03-08 DIAGNOSIS — E876 Hypokalemia: Secondary | ICD-10-CM | POA: Diagnosis not present

## 2019-03-08 DIAGNOSIS — N179 Acute kidney failure, unspecified: Secondary | ICD-10-CM | POA: Diagnosis not present

## 2019-03-08 DIAGNOSIS — S82142D Displaced bicondylar fracture of left tibia, subsequent encounter for closed fracture with routine healing: Secondary | ICD-10-CM | POA: Diagnosis not present

## 2019-03-08 DIAGNOSIS — F329 Major depressive disorder, single episode, unspecified: Secondary | ICD-10-CM | POA: Diagnosis present

## 2019-03-08 DIAGNOSIS — R0602 Shortness of breath: Secondary | ICD-10-CM | POA: Diagnosis not present

## 2019-03-08 DIAGNOSIS — R0902 Hypoxemia: Secondary | ICD-10-CM | POA: Diagnosis not present

## 2019-03-08 DIAGNOSIS — Z6841 Body Mass Index (BMI) 40.0 and over, adult: Secondary | ICD-10-CM

## 2019-03-08 DIAGNOSIS — B182 Chronic viral hepatitis C: Secondary | ICD-10-CM | POA: Diagnosis present

## 2019-03-08 DIAGNOSIS — I4430 Unspecified atrioventricular block: Secondary | ICD-10-CM | POA: Diagnosis not present

## 2019-03-08 DIAGNOSIS — Z9981 Dependence on supplemental oxygen: Secondary | ICD-10-CM

## 2019-03-08 DIAGNOSIS — S83242D Other tear of medial meniscus, current injury, left knee, subsequent encounter: Secondary | ICD-10-CM | POA: Diagnosis not present

## 2019-03-08 DIAGNOSIS — I10 Essential (primary) hypertension: Secondary | ICD-10-CM | POA: Diagnosis not present

## 2019-03-08 DIAGNOSIS — Z818 Family history of other mental and behavioral disorders: Secondary | ICD-10-CM

## 2019-03-08 DIAGNOSIS — N183 Chronic kidney disease, stage 3 (moderate): Secondary | ICD-10-CM | POA: Diagnosis not present

## 2019-03-08 DIAGNOSIS — S92342D Displaced fracture of fourth metatarsal bone, left foot, subsequent encounter for fracture with routine healing: Secondary | ICD-10-CM | POA: Diagnosis not present

## 2019-03-08 DIAGNOSIS — Z79899 Other long term (current) drug therapy: Secondary | ICD-10-CM

## 2019-03-08 DIAGNOSIS — R1010 Upper abdominal pain, unspecified: Secondary | ICD-10-CM | POA: Diagnosis not present

## 2019-03-08 DIAGNOSIS — J439 Emphysema, unspecified: Secondary | ICD-10-CM | POA: Diagnosis present

## 2019-03-08 DIAGNOSIS — Z888 Allergy status to other drugs, medicaments and biological substances status: Secondary | ICD-10-CM

## 2019-03-08 DIAGNOSIS — Z833 Family history of diabetes mellitus: Secondary | ICD-10-CM

## 2019-03-08 DIAGNOSIS — E119 Type 2 diabetes mellitus without complications: Secondary | ICD-10-CM | POA: Diagnosis not present

## 2019-03-08 DIAGNOSIS — Z7951 Long term (current) use of inhaled steroids: Secondary | ICD-10-CM

## 2019-03-08 HISTORY — DX: Morbid (severe) obesity due to excess calories: E66.01

## 2019-03-08 LAB — COMPREHENSIVE METABOLIC PANEL
ALT: 17 U/L (ref 0–44)
AST: 22 U/L (ref 15–41)
Albumin: 3.4 g/dL — ABNORMAL LOW (ref 3.5–5.0)
Alkaline Phosphatase: 88 U/L (ref 38–126)
Anion gap: 14 (ref 5–15)
BUN: 44 mg/dL — ABNORMAL HIGH (ref 8–23)
CO2: 28 mmol/L (ref 22–32)
Calcium: 9.2 mg/dL (ref 8.9–10.3)
Chloride: 92 mmol/L — ABNORMAL LOW (ref 98–111)
Creatinine, Ser: 2.53 mg/dL — ABNORMAL HIGH (ref 0.44–1.00)
GFR calc Af Amer: 22 mL/min — ABNORMAL LOW (ref >60)
GFR calc non Af Amer: 19 mL/min — ABNORMAL LOW (ref >60)
Glucose, Bld: 129 mg/dL — ABNORMAL HIGH (ref 70–99)
Potassium: 3.8 mmol/L (ref 3.5–5.1)
Sodium: 134 mmol/L — ABNORMAL LOW (ref 135–145)
Total Bilirubin: 0.4 mg/dL (ref 0.3–1.2)
Total Protein: 9.1 g/dL — ABNORMAL HIGH (ref 6.5–8.1)

## 2019-03-08 LAB — CBC WITH DIFFERENTIAL/PLATELET
Abs Immature Granulocytes: 0.17 10*3/uL — ABNORMAL HIGH (ref 0.00–0.07)
Basophils Absolute: 0.1 10*3/uL (ref 0.0–0.1)
Basophils Relative: 0 %
Eosinophils Absolute: 0.3 10*3/uL (ref 0.0–0.5)
Eosinophils Relative: 2 %
HCT: 36.7 % (ref 36.0–46.0)
Hemoglobin: 11.1 g/dL — ABNORMAL LOW (ref 12.0–15.0)
Immature Granulocytes: 1 %
Lymphocytes Relative: 21 %
Lymphs Abs: 2.7 10*3/uL (ref 0.7–4.0)
MCH: 30.7 pg (ref 26.0–34.0)
MCHC: 30.2 g/dL (ref 30.0–36.0)
MCV: 101.7 fL — ABNORMAL HIGH (ref 80.0–100.0)
Monocytes Absolute: 0.9 10*3/uL (ref 0.1–1.0)
Monocytes Relative: 7 %
Neutro Abs: 8.9 10*3/uL — ABNORMAL HIGH (ref 1.7–7.7)
Neutrophils Relative %: 69 %
Platelets: 234 10*3/uL (ref 150–400)
RBC: 3.61 MIL/uL — ABNORMAL LOW (ref 3.87–5.11)
RDW: 13.3 % (ref 11.5–15.5)
WBC: 13 10*3/uL — ABNORMAL HIGH (ref 4.0–10.5)
nRBC: 0.3 % — ABNORMAL HIGH (ref 0.0–0.2)

## 2019-03-08 LAB — CBG MONITORING, ED: Glucose-Capillary: 131 mg/dL — ABNORMAL HIGH (ref 70–99)

## 2019-03-08 LAB — LIPASE, BLOOD: Lipase: 59 U/L — ABNORMAL HIGH (ref 11–51)

## 2019-03-08 MED ORDER — LORAZEPAM 0.5 MG PO TABS
0.5000 mg | ORAL_TABLET | ORAL | Status: DC
  Administered 2019-03-08: 1 mg via ORAL
  Filled 2019-03-08: qty 2

## 2019-03-08 MED ORDER — MORPHINE SULFATE (PF) 2 MG/ML IV SOLN: 1.0000 mg | INTRAVENOUS | Status: DC | PRN

## 2019-03-08 MED ORDER — QUETIAPINE FUMARATE 100 MG PO TABS
100.0000 mg | ORAL_TABLET | Freq: Every day | ORAL | Status: DC
  Administered 2019-03-08 – 2019-03-11 (×4): 100 mg via ORAL
  Filled 2019-03-08 (×4): qty 1

## 2019-03-08 MED ORDER — SODIUM CHLORIDE 0.9 % IV SOLN
INTRAVENOUS | Status: DC
  Administered 2019-03-09 – 2019-03-10 (×3): via INTRAVENOUS

## 2019-03-08 MED ORDER — ONDANSETRON HCL 4 MG PO TABS: 4.0000 mg | ORAL_TABLET | Freq: Four times a day (QID) | ORAL | Status: DC | PRN

## 2019-03-08 MED ORDER — IPRATROPIUM-ALBUTEROL 0.5-2.5 (3) MG/3ML IN SOLN: 3.0000 mL | Freq: Four times a day (QID) | RESPIRATORY_TRACT | Status: DC | PRN

## 2019-03-08 MED ORDER — ACETAMINOPHEN 650 MG RE SUPP: 650.0000 mg | Freq: Four times a day (QID) | RECTAL | Status: DC | PRN

## 2019-03-08 MED ORDER — INSULIN ASPART 100 UNIT/ML ~~LOC~~ SOLN
0.0000 [IU] | SUBCUTANEOUS | Status: DC
  Administered 2019-03-10 – 2019-03-12 (×6): 2 [IU] via SUBCUTANEOUS
  Filled 2019-03-08: qty 0.15

## 2019-03-08 MED ORDER — SODIUM CHLORIDE 0.9 % IV BOLUS
1000.0000 mL | Freq: Once | INTRAVENOUS | Status: AC
  Administered 2019-03-08: 1000 mL via INTRAVENOUS

## 2019-03-08 MED ORDER — ACETAMINOPHEN 325 MG PO TABS
650.0000 mg | ORAL_TABLET | Freq: Four times a day (QID) | ORAL | Status: DC | PRN
  Filled 2019-03-08 (×2): qty 2

## 2019-03-08 MED ORDER — FAMOTIDINE 20 MG PO TABS: 20.0000 mg | ORAL_TABLET | Freq: Every day | ORAL | Status: DC | PRN

## 2019-03-08 MED ORDER — ENOXAPARIN SODIUM 40 MG/0.4ML ~~LOC~~ SOLN: 40.0000 mg | Freq: Every day | SUBCUTANEOUS | Status: DC

## 2019-03-08 MED ORDER — INSULIN GLARGINE 100 UNIT/ML ~~LOC~~ SOLN
15.0000 [IU] | Freq: Every day | SUBCUTANEOUS | Status: DC
  Filled 2019-03-08 (×2): qty 0.15

## 2019-03-08 MED ORDER — LEVOTHYROXINE SODIUM 50 MCG PO TABS
50.0000 ug | ORAL_TABLET | Freq: Every day | ORAL | Status: DC
  Administered 2019-03-09: 50 ug via ORAL
  Filled 2019-03-08: qty 1

## 2019-03-08 MED ORDER — FLUTICASONE PROPIONATE 50 MCG/ACT NA SUSP: 2.0000 | Freq: Every day | NASAL | Status: DC | PRN

## 2019-03-08 MED ORDER — ZOLPIDEM TARTRATE 10 MG PO TABS
10.0000 mg | ORAL_TABLET | Freq: Every day | ORAL | Status: DC
  Administered 2019-03-08: 10 mg via ORAL
  Filled 2019-03-08: qty 1

## 2019-03-08 MED ORDER — TORSEMIDE 20 MG PO TABS
40.0000 mg | ORAL_TABLET | Freq: Every day | ORAL | Status: DC
  Filled 2019-03-08: qty 2

## 2019-03-08 MED ORDER — MORPHINE SULFATE (PF) 2 MG/ML IV SOLN: 2.0000 mg | INTRAVENOUS | Status: DC | PRN

## 2019-03-08 MED ORDER — POTASSIUM CHLORIDE CRYS ER 20 MEQ PO TBCR: 20.0000 meq | EXTENDED_RELEASE_TABLET | Freq: Every day | ORAL | Status: DC

## 2019-03-08 MED ORDER — METOPROLOL SUCCINATE ER 100 MG PO TB24
100.0000 mg | ORAL_TABLET | Freq: Every day | ORAL | Status: DC
  Filled 2019-03-08: qty 1

## 2019-03-08 MED ORDER — BUSPIRONE HCL 5 MG PO TABS
15.0000 mg | ORAL_TABLET | Freq: Two times a day (BID) | ORAL | Status: DC
  Administered 2019-03-08 – 2019-03-12 (×8): 15 mg via ORAL
  Filled 2019-03-08 (×4): qty 1
  Filled 2019-03-08: qty 2
  Filled 2019-03-08 (×3): qty 1

## 2019-03-08 MED ORDER — ONDANSETRON HCL 4 MG/2ML IJ SOLN
4.0000 mg | Freq: Four times a day (QID) | INTRAMUSCULAR | Status: DC | PRN
  Administered 2019-03-09: 4 mg via INTRAVENOUS
  Filled 2019-03-08: qty 2

## 2019-03-08 MED ORDER — LINACLOTIDE 145 MCG PO CAPS
145.0000 ug | ORAL_CAPSULE | Freq: Every day | ORAL | Status: DC
  Filled 2019-03-08: qty 1

## 2019-03-08 NOTE — ED Provider Notes (Signed)
[facility] DEPT Provider Note   CSN: [id number] Arrival date & time: 03/08/19  1542    History   Chief Complaint Chief Complaint  Patient presents with   Abdominal Pain   Nausea   Emesis   Constipation    HPI AVANELLE PIXLEY is a 68 y.o. female.     Patient is a 68 year old female with a history of GERD, hypertension, TIA, COPD on home oxygen at 3 L/min who presents with abdominal pain.  She had a recent fall with a proximal fibular fracture.  She had a short hospitalization where she was taking some narcotic pain medications for this.  She became constipated.  She subsequently stopped taking the opioids but she has not had a bowel movement in about a week.  She has had some ongoing bilious type vomiting.  She denies any hematemesis.  She has some pain across her upper abdomen and some bloating of her abdomen.  She has tried 3 enemas, Dulcolax and recently was started on Linzess without improvement in symptoms.  She denies any fevers.  No cough or cold symptoms.     Past Medical History:  Diagnosis Date   Allergic rhinitis    Depression    Emphysema of lung ([place])    3L home O2   GERD (gastroesophageal reflux disease)    Hypertension    Hypothyroidism    Stroke ([place]) 2016   TIA    Urine incontinence     Patient Active Problem List   Diagnosis Date Noted   Fracture of left tibia 02/25/2019   Left tibial fracture 02/25/2019   Hyperglycemia 08/16/2018   Morbid obesity due to excess calories ([place]) 05/09/2018   Acute bronchitis with COPD ([place]) 10/19/2017   Atypical chest pain 05/01/2017   Pulmonary emphysema ([place]) 04/06/2017   Chronic seasonal allergic rhinitis 04/06/2017   GERD (gastroesophageal reflux disease) 04/06/2017   Snoring 04/06/2017   Myalgia 02/01/2017   Arthralgia 02/01/2017   Chronic pain of toe of left foot 11/14/2016   Chronic hepatitis C without hepatic coma ([place]) 08/01/2016   Hypothyroidism  45/80/9983   Mild diastolic dysfunction 38/25/0539   Edema 01/01/2015   Lower extremity edema 12/24/2014   TIA (transient ischemic attack) 07/30/2014   Weakness 07/29/2014   Flank pain 11/12/2013   Chronic respiratory failure ([place]) 10/25/2013   DOE (dyspnea on exertion) 10/25/2013   Depression with anxiety 10/15/2013   Insomnia 10/15/2013   HTN (hypertension) 10/09/2013   Hypokalemia 10/09/2013    Past Surgical History:  Procedure Laterality Date   ABDOMINAL HYSTERECTOMY     CESAREAN SECTION       OB History   No obstetric history on file.      Home Medications    Prior to Admission medications   Medication Sig Start Date End Date Taking? Authorizing Provider  albuterol (PROVENTIL HFA;VENTOLIN HFA) 108 (90 Base) MCG/ACT inhaler Inhale 1-2 puffs into the lungs every 6 (six) hours as needed for wheezing or shortness of breath.    [provider]  albuterol (PROVENTIL) (2.5 MG/3ML) 0.083% nebulizer solution Take 3 mLs (2.5 mg total) by nebulization every 6 (six) hours as needed for wheezing or shortness of breath. 03/31/17   Carollee Herter, Alferd Apa, DO  blood glucose meter kit and supplies KIT Dispense based on patient and insurance preference. Use up to four times daily as directed. (FOR ICD-9 250.00, 250.01). 10/04/18   Carollee Herter, Alferd Apa, DO  busPIRone (BUSPAR) 15 MG tablet Take 15  mg by mouth 2 (two) times daily.  12/27/17   [provider]  famotidine (PEPCID) 20 MG tablet Take 20 mg by mouth daily as needed for heartburn or indigestion.    [provider]  fluticasone (FLONASE) 50 MCG/ACT nasal spray Place 2 sprays into both nostrils daily as needed for allergies. 05/02/17   Johnson, Clanford L, MD  glipiZIDE (GLUCOTROL) 5 MG tablet Take 0.5 tablets (2.5 mg total) by mouth 2 (two) times daily before a meal. 08/18/18 08/18/19  Danford, Suann Larry, MD  insulin glargine (LANTUS) 100 UNIT/ML injection Inject 0.3 mLs (30 Units total) into the  skin at bedtime. 12/06/18   Ann Held, DO  Insulin Syringe-Needle U-100 28G X 5/16" 0.5 ML MISC Use with lantus once a day 12/06/18   Carollee Herter, Alferd Apa, DO  levothyroxine (SYNTHROID) 50 MCG tablet Take 1 tablet (50 mcg total) by mouth daily. 01/03/19   Ann Held, DO  linaclotide [facility]) 145 MCG CAPS capsule Take 1 capsule (145 mcg total) by mouth daily before breakfast. 03/05/19   Carollee Herter, Alferd Apa, DO  LORazepam (ATIVAN) 2 MG tablet Take 0.5-1 mg by mouth as directed. 0.5 Mg in the afternoon and 1 MG at bedtime 01/31/17   Carollee Herter, Alferd Apa, DO  metFORMIN (GLUCOPHAGE) 500 MG tablet Take 2 tablets (1,000 mg total) by mouth 2 (two) times daily with a meal. 08/18/18 08/18/19  Danford, Suann Larry, MD  methocarbamol (ROBAXIN) 500 MG tablet Take 1 tablet (500 mg total) by mouth every 6 (six) hours as needed for muscle spasms. 02/26/19   Black, Lezlie Octave, NP  metoprolol succinate (TOPROL-XL) 50 MG 24 hr tablet TAKE 2 TABLETS BY MOUTH ONCE DAILY IN THE MORNING WITH A MEAL OR IMMEDIATELY FOLLOWING A MEAL 01/03/19   Carollee Herter, Yvonne R, DO  ondansetron (ZOFRAN) 4 MG tablet Take 1 tablet (4 mg total) by mouth every 8 (eight) hours as needed for nausea or vomiting. 03/05/19   Carollee Herter, Alferd Apa, DO  OXYGEN Inhale 3 L into the lungs continuous.     [provider]  potassium chloride SA (K-DUR) 20 MEQ tablet Take 1 tablet (20 mEq total) by mouth daily. 01/03/19   Ann Held, DO  QUEtiapine (SEROQUEL) 100 MG tablet Take 100 mg by mouth at bedtime. 02/23/19   [provider]  torsemide (DEMADEX) 20 MG tablet Take 1 tablet (20 mg total) by mouth daily. Patient taking differently: Take 40 mg by mouth daily. Taking 2 tablets(29m) daily 01/03/19 04/03/19  LCarollee Herter YAlferd Apa DO  zolpidem (AMBIEN) 5 MG tablet Take 2 tablets (10 mg total) by mouth at bedtime. 05/02/17   JMurlean Iba MD    Family History Family History  Problem Relation Age of Onset     Heart disease Father        MVP and Pics Valve   Hypertension Father    Depression Father        Institutionalized x's 2 years   Bipolar disorder Father    Hypertension Sister    Diabetes Sister    Hyperlipidemia Sister    Heart disease Sister 547      MI   Heart disease Brother    Hypertension Brother    Heart disease Paternal Grandmother    Heart disease Paternal Aunt    Heart disease Paternal Uncle    Schizophrenia Paternal A5   Asthma Son    Asthma Son  Social History Social History   Tobacco Use   Smoking status: Former Smoker    Packs/day: 1.00    Years: 40.00    Pack years: 40.00    Types: Cigarettes    Start date: 07/21/1972    Quit date: 10/16/2011    Years since quitting: 7.3   Smokeless tobacco: Never Used  Substance Use Topics   Alcohol use: Not Currently    Comment: Occ-- Wine   Drug use: No     Allergies   Norvasc [amlodipine besylate]   Review of Systems Review of Systems  Constitutional: Negative for chills, diaphoresis, fatigue and fever.  HENT: Negative for congestion, rhinorrhea and sneezing.   Eyes: Negative.   Respiratory: Negative for cough, chest tightness and shortness of breath.   Cardiovascular: Negative for chest pain and leg swelling.  Gastrointestinal: Positive for abdominal pain, constipation, nausea and vomiting. Negative for blood in stool and diarrhea.  Genitourinary: Negative for difficulty urinating, flank pain, frequency and hematuria.  Musculoskeletal: Negative for arthralgias and back pain.  Skin: Negative for rash.  Neurological: Negative for dizziness, speech difficulty, weakness, numbness and headaches.     Physical Exam Updated Vital Signs BP 136/84    Pulse 82    Temp 98.5 F (36.9 C)    Resp 12    Ht '5\' 2"'  (1.575 m)    Wt 136.1 kg    SpO2 96%    BMI 54.87 kg/m   Physical Exam Constitutional:      Appearance: She is well-developed.  HENT:     Head: Normocephalic and atraumatic.   Eyes:     Pupils: Pupils are equal, round, and reactive to light.  Neck:     Musculoskeletal: Normal range of motion and neck supple.  Cardiovascular:     Rate and Rhythm: Normal rate and regular rhythm.     Heart sounds: Normal heart sounds.  Pulmonary:     Effort: Pulmonary effort is normal. No respiratory distress.     Breath sounds: Normal breath sounds. No wheezing or rales.  Chest:     Chest wall: No tenderness.  Abdominal:     General: Bowel sounds are normal.     Palpations: Abdomen is soft.     Tenderness: There is abdominal tenderness in the epigastric area and periumbilical area. There is no guarding or rebound.  Genitourinary:    Comments: No gross blood on exam, no impaction Musculoskeletal: Normal range of motion.  Lymphadenopathy:     Cervical: No cervical adenopathy.  Skin:    General: Skin is warm and dry.     Findings: No rash.  Neurological:     Mental Status: She is alert and oriented to person, place, and time.      ED Treatments / Results  Labs (all labs ordered are listed, but only abnormal results are displayed) Labs Reviewed  COMPREHENSIVE METABOLIC PANEL - Abnormal; Notable for the following components:      Result Value   Sodium 134 (*)    Chloride 92 (*)    Glucose, Bld 129 (*)    BUN 44 (*)    Creatinine, Ser 2.53 (*)    Total Protein 9.1 (*)    Albumin 3.4 (*)    GFR calc non Af Amer 19 (*)    GFR calc Af Amer 22 (*)    All other components within normal limits  LIPASE, BLOOD - Abnormal; Notable for the following components:   Lipase 59 (*)    All  other components within normal limits  CBC WITH DIFFERENTIAL/PLATELET - Abnormal; Notable for the following components:   WBC 13.0 (*)    RBC 3.61 (*)    Hemoglobin 11.1 (*)    MCV 101.7 (*)    nRBC 0.3 (*)    Neutro Abs 8.9 (*)    Abs Immature Granulocytes 0.17 (*)    All other components within normal limits  CBG MONITORING, ED - Abnormal; Notable for the following components:    Glucose-Capillary 131 (*)    All other components within normal limits  URINALYSIS, ROUTINE W REFLEX MICROSCOPIC    EKG None  Radiology Ct Abdomen Pelvis Wo Contrast  Result Date: 03/08/2019 CLINICAL DATA:  Abdominal pain for 1 week. EXAM: CT ABDOMEN AND PELVIS WITHOUT CONTRAST TECHNIQUE: Multidetector CT imaging of the abdomen and pelvis was performed following the standard protocol without IV contrast. COMPARISON:  Abdominal ultrasound 11/04/2016 FINDINGS: Lower chest: Centrilobular emphysema and bibasilar scarring. No pleural effusion. Normal heart size. Hepatobiliary: No focal liver abnormality is seen on this unenhanced study. Multiple calcified stones are present in the gallbladder without evidence of acute cholecystitis. There is no biliary dilatation. Pancreas: Unremarkable. Spleen: Unremarkable. Adrenals/Urinary Tract: Unremarkable adrenal glands. No evidence of renal calculi or hydronephrosis. Slightly small renal size bilaterally. Unremarkable bladder. Stomach/Bowel: The stomach is unremarkable. There is relatively diffuse dilatation of small bowel loops which measure up to nearly 4 cm in diameter with numerous air-fluid levels. A well-defined transition point is not evident, however there is a generalized transition in the central to right lower abdomen with the distal ileum being decompressed. Gas is present in nondilated ascending and transverse colon while the descending and sigmoid colon and rectum are largely collapsed. A few sigmoid colon diverticula are noted without evidence diverticulitis. The appendix is unremarkable. Vascular/Lymphatic: Mildly enlarged lymph nodes in the porta hepatis measuring up to 1.4 cm in short axis, likely reactive and related to the patient's history of chronic hepatitis C infection. Aortic atherosclerosis without aneurysm. Reproductive: Status post hysterectomy. No adnexal masses. Other: No intraperitoneal free fluid. Musculoskeletal: Advanced lower lumbar  facet arthrosis with grade 1 anterolisthesis of L4 on L5. IMPRESSION: 1. Diffuse small bowel dilatation with decompressed distal ileum consistent with obstruction. 2. Cholelithiasis. 3. Aortic Atherosclerosis (ICD10-I70.0) and Emphysema (ICD10-J43.9). Electronically Signed   By: Logan Bores M.D.   On: 03/08/2019 19:51    Procedures Procedures (including critical care time)  Medications Ordered in ED Medications  sodium chloride 0.9 % bolus 1,000 mL (1,000 mLs Intravenous New Bag/Given 03/08/19 1914)     Initial Impression / Assessment and Plan / ED Course  I have reviewed the triage vital signs and the nursing notes.  Pertinent labs & imaging results that were available during my care of the patient were reviewed by me and considered in my medical decision making (see chart for details).        Patient is a 68 year old female who presents with abdominal pain and bilious emesis.  Her labs show evidence of acute kidney injury.  Her white count is elevated.  CT scan shows evidence of a small bowel obstruction.  She was given IV fluids.  An NG tube was placed.  I spoke with Dr. Windle Guard with general surgery who request the hospitalist to admit the patient although she will consult.  I spoke with Dr. Ara Kussmaul with the hospitalist service to admit the patient.  Final Clinical Impressions(s) / ED Diagnoses   Final diagnoses:  SBO (small bowel obstruction) ([place])  ED Discharge Orders    None       Malvin Johns, MD 03/08/19 2056

## 2019-03-08 NOTE — ED Notes (Addendum)
NG tube attempted. Patient pulled NG tube out as attempt was in progress. Patient then refused NG tube. Doctor made aware.

## 2019-03-08 NOTE — ED Notes (Signed)
Pt unable to urinate at this time due to needing fluids. Nurse Aware. Will try again later.

## 2019-03-08 NOTE — H&P (Addendum)
History and Physical   [facility] [place] Admission History and Physical [employer], D.O.    Patient Name: Sue [name] MR#: [id number] Date of Birth: April 04, 1951 Date of Admission: 03/08/2019  Referring MD/NP/PA: Dr. Tamera Punt Primary Care Physician: Carollee Herter, Alferd Apa, DO  Chief Complaint:  Chief Complaint  Patient presents with  . Abdominal Pain  . Nausea  . Emesis  . Constipation    HPI: Sue [name] is a 68 y.o. 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  Patient was in a usual state of health until hospitalization for fall with a fibular fracture.  She had been taking narcotic pain medication for pain control.  Reports no bowel movement in the past week.  For the last day she has had multiple episodes of bilious vomiting associated with abdominal pain, bloating.  Symptoms have been refractory to enemas and stool softeners..  Patient denies fevers/chills, weakness, dizziness, chest pain, shortness of breath,, dysuria/frequency, changes in mental status.   EMS/ED Course: Patient received normal saline. Medical admission has been requested for further management of mobile obstruction, acute kidney injury.  Review of Systems:  CONSTITUTIONAL: No fever/chills, fatigue, weakness, weight gain/loss, headache. EYES: No blurry or double vision. ENT: No tinnitus, postnasal drip, redness or soreness of the oropharynx. RESPIRATORY: No cough, dyspnea, wheeze.  No hemoptysis.  CARDIOVASCULAR: No chest pain, palpitations, syncope, orthopnea. No lower extremity edema.  GASTROINTESTINAL: Positive nausea, vomiting, abdominal pain, constipation. No diarrhea.  No hematemesis, melena or hematochezia. GENITOURINARY: No dysuria, frequency, hematuria. ENDOCRINE: No polyuria or nocturia. No heat or cold intolerance. HEMATOLOGY: No anemia,  bruising, bleeding. INTEGUMENTARY: No rashes, ulcers, lesions. MUSCULOSKELETAL: No arthritis, gout, dyspnea. NEUROLOGIC: No numbness, tingling, ataxia, seizure-type activity, weakness. PSYCHIATRIC: No anxiety, depression, insomnia.   Past Medical History:  Diagnosis Date  . Allergic rhinitis   . Depression   . Emphysema of lung ([place])    3L home O2  . GERD (gastroesophageal reflux disease)   . Hypertension   . Hypothyroidism   . Stroke [facility]) 2016   TIA   . Urine incontinence     Past Surgical History:  Procedure Laterality Date  . ABDOMINAL HYSTERECTOMY    . CESAREAN SECTION       reports that she quit smoking about 7 years ago. Her smoking use included cigarettes. She started smoking about 46 years ago. She has a 40.00 pack-year smoking history. She has never used smokeless tobacco. She reports previous alcohol use. She reports that she does not use drugs.  Allergies  Allergen Reactions  . Norvasc [Amlodipine Besylate]     Marked swelling    Family History  Problem Relation Age of Onset  . Heart disease Father        MVP and Pics Valve  . Hypertension Father   . Depression Father        Institutionalized x's 2 years  . Bipolar disorder Father   . Hypertension Sister   . Diabetes Sister   . Hyperlipidemia Sister   . Heart disease Sister 31       MI  . Heart disease Brother   . Hypertension Brother   . Heart disease Paternal Grandmother   . Heart disease Paternal Aunt   . Heart disease Paternal Uncle   . Schizophrenia Paternal Aunt   . Asthma Son   . Asthma Son  Prior to Admission medications   Medication Sig Start Date End Date Taking? Authorizing Provider  albuterol (PROVENTIL HFA;VENTOLIN HFA) 108 (90 Base) MCG/ACT inhaler Inhale 1-2 puffs into the lungs every 6 (six) hours as needed for wheezing or shortness of breath.    [provider]  albuterol (PROVENTIL) (2.5 MG/3ML) 0.083% nebulizer solution Take 3 mLs (2.5 mg total) by nebulization  every 6 (six) hours as needed for wheezing or shortness of breath. 03/31/17   Carollee Herter, Alferd Apa, DO  blood glucose meter kit and supplies KIT Dispense based on patient and insurance preference. Use up to four times daily as directed. (FOR ICD-9 250.00, 250.01). 10/04/18   Carollee Herter, Alferd Apa, DO  busPIRone (BUSPAR) 15 MG tablet Take 15 mg by mouth 2 (two) times daily.  12/27/17   [provider]  famotidine (PEPCID) 20 MG tablet Take 20 mg by mouth daily as needed for heartburn or indigestion.    [provider]  fluticasone (FLONASE) 50 MCG/ACT nasal spray Place 2 sprays into both nostrils daily as needed for allergies. 05/02/17   Johnson, Clanford L, MD  glipiZIDE (GLUCOTROL) 5 MG tablet Take 0.5 tablets (2.5 mg total) by mouth 2 (two) times daily before a meal. 08/18/18 08/18/19  Danford, Suann Larry, MD  insulin glargine (LANTUS) 100 UNIT/ML injection Inject 0.3 mLs (30 Units total) into the skin at bedtime. 12/06/18   Ann Held, DO  Insulin Syringe-Needle U-100 28G X 5/16" 0.5 ML MISC Use with lantus once a day 12/06/18   Carollee Herter, Alferd Apa, DO  levothyroxine (SYNTHROID) 50 MCG tablet Take 1 tablet (50 mcg total) by mouth daily. 01/03/19   Ann Held, DO  linaclotide [facility]) 145 MCG CAPS capsule Take 1 capsule (145 mcg total) by mouth daily before breakfast. 03/05/19   Carollee Herter, Alferd Apa, DO  LORazepam (ATIVAN) 2 MG tablet Take 0.5-1 mg by mouth as directed. 0.5 Mg in the afternoon and 1 MG at bedtime 01/31/17   Carollee Herter, Alferd Apa, DO  metFORMIN (GLUCOPHAGE) 500 MG tablet Take 2 tablets (1,000 mg total) by mouth 2 (two) times daily with a meal. 08/18/18 08/18/19  Danford, Suann Larry, MD  methocarbamol (ROBAXIN) 500 MG tablet Take 1 tablet (500 mg total) by mouth every 6 (six) hours as needed for muscle spasms. 02/26/19   Black, Lezlie Octave, NP  metoprolol succinate (TOPROL-XL) 50 MG 24 hr tablet TAKE 2 TABLETS BY MOUTH ONCE DAILY IN THE MORNING WITH A  MEAL OR IMMEDIATELY FOLLOWING A MEAL 01/03/19   Carollee Herter, Yvonne R, DO  ondansetron (ZOFRAN) 4 MG tablet Take 1 tablet (4 mg total) by mouth every 8 (eight) hours as needed for nausea or vomiting. 03/05/19   Carollee Herter, Alferd Apa, DO  OXYGEN Inhale 3 L into the lungs continuous.     [provider]  potassium chloride SA (K-DUR) 20 MEQ tablet Take 1 tablet (20 mEq total) by mouth daily. 01/03/19   Ann Held, DO  QUEtiapine (SEROQUEL) 100 MG tablet Take 100 mg by mouth at bedtime. 02/23/19   [provider]  torsemide (DEMADEX) 20 MG tablet Take 1 tablet (20 mg total) by mouth daily. Patient taking differently: Take 40 mg by mouth daily. Taking 2 tablets(67m) daily 01/03/19 04/03/19  LCarollee Herter YAlferd Apa DO  zolpidem (AMBIEN) 5 MG tablet Take 2 tablets (10 mg total) by mouth at bedtime. 05/02/17   JMurlean Iba MD    Physical Exam:  Vitals:   03/08/19 1614 03/08/19 1622 03/08/19 1842  BP: (!) 158/88  136/84  Pulse: 84  82  Resp: 18  12  Temp: 98.5 F (36.9 C)    SpO2: 95%  96%  Weight:  136.1 kg   Height:  '5\' 2"'  (1.575 m)     GENERAL: 68 y.o.-year-old female patient, well-developed, well-nourished lying in the bed in no acute distress.  Pleasant and cooperative.   HEENT: Head atraumatic, normocephalic. Pupils equal. Mucus membranes moist. NECK: Supple. No JVD. CHEST: Normal breath sounds bilaterally. No wheezing, rales, rhonchi or crackles. No use of accessory muscles of respiration.  No reproducible chest wall tenderness.  CARDIOVASCULAR: S1, S2 normal. No murmurs, rubs, or gallops. Cap refill <2 seconds. Pulses intact distally.  ABDOMEN: Soft, mildly distended, mild diffuse tenderness worse at epigastrum. No rebound, guarding, rigidity. Normoactive bowel sounds present in all four quadrants.  EXTREMITIES: LLE edema at foot and ankle. No pedal edema, cyanosis, or clubbing. No calf tenderness or Homan's sign.  NEUROLOGIC: The patient is alert and  oriented x 3. Cranial nerves II through XII are grossly intact with no focal sensorimotor deficit. PSYCHIATRIC:  Normal affect, mood, thought content. SKIN: Warm, dry, and intact without obvious rash, lesion, or ulcer.    Labs on Admission:  CBC: Recent Labs  Lab 03/08/19 1720  WBC 13.0*  NEUTROABS 8.9*  HGB 11.1*  HCT 36.7  MCV 101.7*  PLT 360   Basic Metabolic Panel: Recent Labs  Lab 03/08/19 1720  NA 134*  K 3.8  CL 92*  CO2 28  GLUCOSE 129*  BUN 44*  CREATININE 2.53*  CALCIUM 9.2   GFR: Estimated Creatinine Clearance: 29.2 mL/min (A) (by C-G formula based on SCr of 2.53 mg/dL (H)). Liver Function Tests: Recent Labs  Lab 03/08/19 1720  AST 22  ALT 17  ALKPHOS 88  BILITOT 0.4  PROT 9.1*  ALBUMIN 3.4*   Recent Labs  Lab 03/08/19 1720  LIPASE 59*   No results for input(s): AMMONIA in the last 168 hours. Coagulation Profile: No results for input(s): INR, PROTIME in the last 168 hours. Cardiac Enzymes: No results for input(s): CKTOTAL, CKMB, CKMBINDEX, TROPONINI in the last 168 hours. BNP (last 3 results) No results for input(s): PROBNP in the last 8760 hours. HbA1C: No results for input(s): HGBA1C in the last 72 hours. CBG: Recent Labs  Lab 03/08/19 1622  GLUCAP 131*   Lipid Profile: No results for input(s): CHOL, HDL, LDLCALC, TRIG, CHOLHDL, LDLDIRECT in the last 72 hours. Thyroid Function Tests: No results for input(s): TSH, T4TOTAL, FREET4, T3FREE, THYROIDAB in the last 72 hours. Anemia Panel: No results for input(s): VITAMINB12, FOLATE, FERRITIN, TIBC, IRON, RETICCTPCT in the last 72 hours. Urine analysis:    Component Value Date/Time   COLORURINE YELLOW 08/15/2018 2201   APPEARANCEUR CLEAR 08/15/2018 2201   LABSPEC <1.005 (L) 08/15/2018 2201   PHURINE 5.5 08/15/2018 2201   GLUCOSEU >=500 (A) 08/15/2018 2201   GLUCOSEU NEGATIVE 11/11/2013 1353   HGBUR NEGATIVE 08/15/2018 2201   BILIRUBINUR NEGATIVE 08/15/2018 2201   BILIRUBINUR neg  10/27/2015 1355   KETONESUR NEGATIVE 08/15/2018 2201   PROTEINUR NEGATIVE 08/15/2018 2201   UROBILINOGEN 0.2 10/27/2015 1355   UROBILINOGEN 0.2 07/29/2014 2300   NITRITE NEGATIVE 08/15/2018 2201   LEUKOCYTESUR NEGATIVE 08/15/2018 2201   Sepsis Labs: '@LABRCNTIP' (procalcitonin:4,lacticidven:4) )No results found for this or any previous visit (from the past 240 hour(s)).   Radiological Exams on Admission: Ct Abdomen Pelvis Wo Contrast  Result  Date: 03/08/2019 CLINICAL DATA:  Abdominal pain for 1 week. EXAM: CT ABDOMEN AND PELVIS WITHOUT CONTRAST TECHNIQUE: Multidetector CT imaging of the abdomen and pelvis was performed following the standard protocol without IV contrast. COMPARISON:  Abdominal ultrasound 11/04/2016 FINDINGS: Lower chest: Centrilobular emphysema and bibasilar scarring. No pleural effusion. Normal heart size. Hepatobiliary: No focal liver abnormality is seen on this unenhanced study. Multiple calcified stones are present in the gallbladder without evidence of acute cholecystitis. There is no biliary dilatation. Pancreas: Unremarkable. Spleen: Unremarkable. Adrenals/Urinary Tract: Unremarkable adrenal glands. No evidence of renal calculi or hydronephrosis. Slightly small renal size bilaterally. Unremarkable bladder. Stomach/Bowel: The stomach is unremarkable. There is relatively diffuse dilatation of small bowel loops which measure up to nearly 4 cm in diameter with numerous air-fluid levels. A well-defined transition point is not evident, however there is a generalized transition in the central to right lower abdomen with the distal ileum being decompressed. Gas is present in nondilated ascending and transverse colon while the descending and sigmoid colon and rectum are largely collapsed. A few sigmoid colon diverticula are noted without evidence diverticulitis. The appendix is unremarkable. Vascular/Lymphatic: Mildly enlarged lymph nodes in the porta hepatis measuring up to 1.4 cm in  short axis, likely reactive and related to the patient's history of chronic hepatitis C infection. Aortic atherosclerosis without aneurysm. Reproductive: Status post hysterectomy. No adnexal masses. Other: No intraperitoneal free fluid. Musculoskeletal: Advanced lower lumbar facet arthrosis with grade 1 anterolisthesis of L4 on L5. IMPRESSION: 1. Diffuse small bowel dilatation with decompressed distal ileum consistent with obstruction. 2. Cholelithiasis. 3. Aortic Atherosclerosis (ICD10-I70.0) and Emphysema (ICD10-J43.9). Electronically Signed   By: Logan Bores M.D.   On: 03/08/2019 19:51     Assessment/Plan  This is a 68 y.o. 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 Small bowel obstruction -Admit inpatient -N.p.o. Patient refusing NG tube Pain control Surgical consultation has been requested by the emergency department physician  #. Acute kidney injury  - IV fluids and repeat BMP in AM.  - Avoid nephrotoxic medications - Bladder scan and place foley catheter if evidence of urinary retention  #.  History of hypertension -Continue metoprolol, torsemide  #. History of anxiety and depression - Continue buspirone, lorazepam, quetiapine, Ambien  #. H/o Diabetes - Accuchecks every 4 hours with RISS coverage -Hold glipizide, metformin, half Lantus  #. History of parathyroidism - Continue Synthroid  #. History of COPD - Continue O2, nebulizers, Flonase  #. History of GERD - Continue Pepcid, Linzess  Admission status: Inpatient IV Fluids: Normal saline Diet/Nutrition: N.p.o. Consults called: Surgery called by EDP DVT Px: Lovenox, SCDs and early ambulation. Code Status: Full Code  Disposition Plan: To home in 2-3 days  All the records are reviewed and case discussed with ED provider. Management plans discussed with the patient and/or family who express understanding and agree with plan of  care.  Rand Boller D.O. on 03/08/2019 at 8:53 PM CC: Primary care physician; Ann Held, DO   03/08/2019, 8:53 PM

## 2019-03-08 NOTE — ED Triage Notes (Signed)
Patient presents with epigastric region pain, nausea, vomiting and constipation for 1 week.   EMS vitals: 138/84 BP 87 HR 16 Resp rate 95% O2 sats on room air 97.9 Temp

## 2019-03-08 NOTE — Consult Note (Signed)
Surgical Consultation Requesting provider: Dr. Tamera Punt  CC: abdominal pain, emesis  HPI: Very nice [phone] woman with medical history as listed below who presents with abdominal pain, emesis, and constipation. She recently had a fall on 6/14 and had a significant injury to her lower left leg ( medial meniscus tear, proximal fibula fracture, lateral tibial plateau fracture, 4-5 metatarsal fractures) which were non-operative (hard sole shoe, knee immobilizer, WBAT) but did require a 2 day hospitalization and some narcotic therapy. About 5 days ago she began feeling bloated and reports emesis. She has not had a bowel movement in about a week despite trying many OTC treatments. Continues with intermittent crampy epigastric pain and bilious emesis. Reports prior c-section and hysterectomy both via pfannenstiel. CT shows dilated small bowel with no overt transition to decompressed TI. The ascending and transverse colon are dilated with air. The descending and distal colon are decompressed. Surgery is asked to see regarding possible small bowel obstruction.  She could not tolerate NG tube placement this evening.   Allergies  Allergen Reactions  . Norvasc [Amlodipine Besylate]     Marked swelling    Past Medical History:  Diagnosis Date  . Allergic rhinitis   . Depression   . Emphysema of lung ([place])    3L home O2  . GERD (gastroesophageal reflux disease)   . Hypertension   . Hypothyroidism   . Stroke [facility]) 2016   TIA   . Urine incontinence   - Severe Obesity BMI 54.87  Patient Active Problem List   Diagnosis Date Noted  . Small bowel obstruction ([place]) 03/08/2019  . Fracture of left tibia 02/25/2019  . Left tibial fracture 02/25/2019  . Hyperglycemia 08/16/2018  . Morbid obesity due to excess calories ([place]) 05/09/2018  . Acute bronchitis with COPD ([place]) 10/19/2017  . Atypical chest pain 05/01/2017  . Pulmonary emphysema ([place]) 04/06/2017  . Chronic seasonal allergic rhinitis 04/06/2017  .  GERD (gastroesophageal reflux disease) 04/06/2017  . Snoring 04/06/2017  . Myalgia 02/01/2017  . Arthralgia 02/01/2017  . Chronic pain of toe of left foot 11/14/2016  . Chronic hepatitis C without hepatic coma ([place]) 08/01/2016  . Hypothyroidism 10/29/2015  . Mild diastolic dysfunction 66/44/0347  . Edema 01/01/2015  . Lower extremity edema 12/24/2014  . TIA (transient ischemic attack) 07/30/2014  . Weakness 07/29/2014  . Flank pain 11/12/2013  . Chronic respiratory failure ([place]) 10/25/2013  . DOE (dyspnea on exertion) 10/25/2013  . Depression with anxiety 10/15/2013  . Insomnia 10/15/2013  . HTN (hypertension) 10/09/2013  . Hypokalemia 10/09/2013     Past Surgical History:  Procedure Laterality Date  . ABDOMINAL HYSTERECTOMY    . CESAREAN SECTION      Family History  Problem Relation Age of Onset  . Heart disease Father        MVP and Pics Valve  . Hypertension Father   . Depression Father        Institutionalized x's 2 years  . Bipolar disorder Father   . Hypertension Sister   . Diabetes Sister   . Hyperlipidemia Sister   . Heart disease Sister 49       MI  . Heart disease Brother   . Hypertension Brother   . Heart disease Paternal Grandmother   . Heart disease Paternal Aunt   . Heart disease Paternal Uncle   . Schizophrenia Paternal Aunt   . Asthma Son   . Asthma Son     Social History   Socioeconomic History  . Marital  status: Divorced    Spouse name: Not on file  . Number of children: Not on file  . Years of education: Not on file  . Highest education level: Not on file  Occupational History  . Occupation: disabledIT sales professional Med  Social Needs  . Financial resource strain: Not on file  . Food insecurity    Worry: Not on file    Inability: Not on file  . Transportation needs    Medical: Not on file    Non-medical: Not on file  Tobacco Use  . Smoking status: Former Smoker    Packs/day: 1.00    Years: 40.00    Pack years: 40.00    Types:  Cigarettes    Start date: 07/21/1972    Quit date: 10/16/2011    Years since quitting: 7.3  . Smokeless tobacco: Never Used  Substance and Sexual Activity  . Alcohol use: Not Currently    Comment: Occ-- Wine  . Drug use: No  . Sexual activity: Not on file  Lifestyle  . Physical activity    Days per week: Not on file    Minutes per session: Not on file  . Stress: Not on file  Relationships  . Social Herbalist on phone: Not on file    Gets together: Not on file    Attends religious service: Not on file    Active member of club or organization: Not on file    Attends meetings of clubs or organizations: Not on file    Relationship status: Not on file  Other Topics Concern  . Not on file  Social History Narrative   [facility] (04/06/17):   Originally from [place]. Has also lived in [place], [place]. She also previously lived in [place] for 20 years. No history of [place]. Moved to [place] in 1989. 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 No pets currently. No bird exposure. Questionable previous mold exposure in her daughter's home. 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     No current facility-administered medications on file prior to encounter.    Current Outpatient Medications on File Prior to Encounter  Medication Sig Dispense Refill  . albuterol (PROVENTIL HFA;VENTOLIN HFA) 108 (90 Base) MCG/ACT inhaler Inhale 1-2 puffs into the lungs every 6 (six) hours as needed for wheezing or shortness of breath.    Marland Kitchen albuterol (PROVENTIL) (2.5 MG/3ML) 0.083% nebulizer solution Take 3 mLs (2.5 mg total) by nebulization every 6 (six) hours as needed for wheezing or shortness of breath. 150 mL 1  . blood glucose meter kit and supplies KIT Dispense based on patient and insurance  preference. Use up to four times daily as directed. (FOR ICD-9 250.00, 250.01). 1 each 0  . busPIRone (BUSPAR) 15 MG tablet Take 15 mg by mouth 2 (two) times daily.   4  . famotidine (PEPCID) 20 MG tablet Take 20 mg by mouth daily as needed for heartburn or indigestion.    . fluticasone (FLONASE) 50 MCG/ACT nasal spray Place 2 sprays into both nostrils daily as needed for allergies.    Marland Kitchen glipiZIDE (GLUCOTROL) 5 MG tablet Take 0.5 tablets (2.5 mg total) by mouth 2 (two) times daily before a meal. 30 tablet 11  . insulin glargine (LANTUS) 100 UNIT/ML  injection Inject 0.3 mLs (30 Units total) into the skin at bedtime. 10 mL 5  . Insulin Syringe-Needle U-100 28G X 5/16" 0.5 ML MISC Use with lantus once a day 100 each 1  . levothyroxine (SYNTHROID) 50 MCG tablet Take 1 tablet (50 mcg total) by mouth daily. 90 tablet 0  . linaclotide (LINZESS) 145 MCG CAPS capsule Take 1 capsule (145 mcg total) by mouth daily before breakfast. 30 capsule 0  . LORazepam (ATIVAN) 2 MG tablet Take 0.5-1 mg by mouth as directed. 0.5 Mg in the afternoon and 1 MG at bedtime 30 tablet 0  . metFORMIN (GLUCOPHAGE) 500 MG tablet Take 2 tablets (1,000 mg total) by mouth 2 (two) times daily with a meal. 120 tablet 11  . methocarbamol (ROBAXIN) 500 MG tablet Take 1 tablet (500 mg total) by mouth every 6 (six) hours as needed for muscle spasms. 10 tablet 0  . metoprolol succinate (TOPROL-XL) 50 MG 24 hr tablet TAKE 2 TABLETS BY MOUTH ONCE DAILY IN THE MORNING WITH A MEAL OR IMMEDIATELY FOLLOWING A MEAL 180 tablet 1  . ondansetron (ZOFRAN) 4 MG tablet Take 1 tablet (4 mg total) by mouth every 8 (eight) hours as needed for nausea or vomiting. 20 tablet 0  . OXYGEN Inhale 3 L into the lungs continuous.     . potassium chloride SA (K-DUR) 20 MEQ tablet Take 1 tablet (20 mEq total) by mouth daily. 30 tablet 0  . QUEtiapine (SEROQUEL) 100 MG tablet Take 100 mg by mouth at bedtime.    . torsemide (DEMADEX) 20 MG tablet Take 1 tablet (20 mg  total) by mouth daily. (Patient taking differently: Take 40 mg by mouth daily. Taking 2 tablets(15m) daily) 180 tablet 3  . zolpidem (AMBIEN) 5 MG tablet Take 2 tablets (10 mg total) by mouth at bedtime.      Review of Systems: a complete, 10pt review of systems was completed with pertinent positives and negatives as documented in the HPI  Physical Exam: Vitals:   03/08/19 1614 03/08/19 1842  BP: (!) 158/88 136/84  Pulse: 84 82  Resp: 18 12  Temp: 98.5 F (36.9 C)   SpO2: 95% 96%   Gen: alert and cooperative woman, obese and chronically ill appearing and on O2 Head: normocephalic, atraumatic Eyes: extraocular motions intact, anicteric.  Neck: supple without mass or thyromegaly Chest: unlabored respirations, symmetrical air entry  Cardiovascular: RRR Abdomen: Obese, distended, mildly tender in upper midline without peritoneal signs. No mass or organomegaly.  Extremities: warm, left ankle and dorsal foot are swollen, dorsal lateral foot is mildly erythematous and tender c/w known fractures Neuro: grossly intact Psych: appropriate mood and affect, normal insight  Skin: warm and dry   CBC Latest Ref Rng & Units 03/08/2019 02/26/2019 02/24/2019  WBC 4.0 - 10.5 K/uL 13.0(H) 9.2 7.2  Hemoglobin 12.0 - 15.0 g/dL 11.1(L) 10.5(L) 11.1(L)  Hematocrit 36.0 - 46.0 % 36.7 33.6(L) 35.9(L)  Platelets 150 - 400 K/uL 234 173 189    CMP Latest Ref Rng & Units 03/08/2019 02/26/2019 02/24/2019  Glucose 70 - 99 mg/dL 129(H) 149(H) 168(H)  BUN 8 - 23 mg/dL 44(H) 20 20  Creatinine 0.44 - 1.00 mg/dL 2.53(H) 1.70(H) 1.73(H)  Sodium 135 - 145 mmol/L 134(L) 135 138  Potassium 3.5 - 5.1 mmol/L 3.8 4.1 4.5  Chloride 98 - 111 mmol/L 92(L) 97(L) 101  CO2 22 - 32 mmol/L '28 29 29  ' Calcium 8.9 - 10.3 mg/dL 9.2 8.7(L) 8.3(L)  Total Protein 6.5 -  8.1 g/dL 9.1(H) - 8.3(H)  Total Bilirubin 0.3 - 1.2 mg/dL 0.4 - 0.2(L)  Alkaline Phos 38 - 126 U/L 88 - 96  AST 15 - 41 U/L 22 - 21  ALT 0 - 44 U/L 17 - 14    Lab  Results  Component Value Date   INR 1.10 05/01/2017   INR 1.21 12/15/2015   INR 1.12 07/30/2014    Imaging: Ct Abdomen Pelvis Wo Contrast  Result Date: 03/08/2019 CLINICAL DATA:  Abdominal pain for 1 week. EXAM: CT ABDOMEN AND PELVIS WITHOUT CONTRAST TECHNIQUE: Multidetector CT imaging of the abdomen and pelvis was performed following the standard protocol without IV contrast. COMPARISON:  Abdominal ultrasound 11/04/2016 FINDINGS: Lower chest: Centrilobular emphysema and bibasilar scarring. No pleural effusion. Normal heart size. Hepatobiliary: No focal liver abnormality is seen on this unenhanced study. Multiple calcified stones are present in the gallbladder without evidence of acute cholecystitis. There is no biliary dilatation. Pancreas: Unremarkable. Spleen: Unremarkable. Adrenals/Urinary Tract: Unremarkable adrenal glands. No evidence of renal calculi or hydronephrosis. Slightly small renal size bilaterally. Unremarkable bladder. Stomach/Bowel: The stomach is unremarkable. There is relatively diffuse dilatation of small bowel loops which measure up to nearly 4 cm in diameter with numerous air-fluid levels. A well-defined transition point is not evident, however there is a generalized transition in the central to right lower abdomen with the distal ileum being decompressed. Gas is present in nondilated ascending and transverse colon while the descending and sigmoid colon and rectum are largely collapsed. A few sigmoid colon diverticula are noted without evidence diverticulitis. The appendix is unremarkable. Vascular/Lymphatic: Mildly enlarged lymph nodes in the porta hepatis measuring up to 1.4 cm in short axis, likely reactive and related to the patient's history of chronic hepatitis C infection. Aortic atherosclerosis without aneurysm. Reproductive: Status post hysterectomy. No adnexal masses. Other: No intraperitoneal free fluid. Musculoskeletal: Advanced lower lumbar facet arthrosis with grade 1  anterolisthesis of L4 on L5. IMPRESSION: 1. Diffuse small bowel dilatation with decompressed distal ileum consistent with obstruction. 2. Cholelithiasis. 3. Aortic Atherosclerosis (ICD10-I70.0) and Emphysema (ICD10-J43.9). Electronically Signed   By: Logan Bores M.D.   On: 03/08/2019 19:51     A/P: 89AT woman with - SBO vs ileus. Could not tolerate NG. Recommend admission for bowel rest, try SBO protocol with oral contrast tomorrow. Nausea control and NON-NARCOTIC pain control if needed.  - Acute on chronic renal insufficiency- careful fluid resuscitation per primary team in setting of poor pulmonary function - Diabetes hg A1c 6.7 - hx htn, stroke, hypothyroidism, gerd, depression  Will continue to follow with you.    Romana Juniper, MD [facility] Surgery, [place] Pager [phone]

## 2019-03-09 ENCOUNTER — Inpatient Hospital Stay (HOSPITAL_COMMUNITY): Payer: Medicare HMO

## 2019-03-09 DIAGNOSIS — E1169 Type 2 diabetes mellitus with other specified complication: Secondary | ICD-10-CM

## 2019-03-09 DIAGNOSIS — E039 Hypothyroidism, unspecified: Secondary | ICD-10-CM

## 2019-03-09 DIAGNOSIS — Z794 Long term (current) use of insulin: Secondary | ICD-10-CM

## 2019-03-09 DIAGNOSIS — I10 Essential (primary) hypertension: Secondary | ICD-10-CM

## 2019-03-09 LAB — BASIC METABOLIC PANEL
Anion gap: 10 (ref 5–15)
BUN: 43 mg/dL — ABNORMAL HIGH (ref 8–23)
CO2: 28 mmol/L (ref 22–32)
Calcium: 8.5 mg/dL — ABNORMAL LOW (ref 8.9–10.3)
Chloride: 97 mmol/L — ABNORMAL LOW (ref 98–111)
Creatinine, Ser: 2.43 mg/dL — ABNORMAL HIGH (ref 0.44–1.00)
GFR calc Af Amer: 23 mL/min — ABNORMAL LOW (ref >60)
GFR calc non Af Amer: 20 mL/min — ABNORMAL LOW (ref >60)
Glucose, Bld: 114 mg/dL — ABNORMAL HIGH (ref 70–99)
Potassium: 3.7 mmol/L (ref 3.5–5.1)
Sodium: 135 mmol/L (ref 135–145)

## 2019-03-09 LAB — CBC
HCT: 34.6 % — ABNORMAL LOW (ref 36.0–46.0)
Hemoglobin: 10.4 g/dL — ABNORMAL LOW (ref 12.0–15.0)
MCH: 30.9 pg (ref 26.0–34.0)
MCHC: 30.1 g/dL (ref 30.0–36.0)
MCV: 102.7 fL — ABNORMAL HIGH (ref 80.0–100.0)
Platelets: 197 10*3/uL (ref 150–400)
RBC: 3.37 MIL/uL — ABNORMAL LOW (ref 3.87–5.11)
RDW: 13.4 % (ref 11.5–15.5)
WBC: 12.1 10*3/uL — ABNORMAL HIGH (ref 4.0–10.5)
nRBC: 0.2 % (ref 0.0–0.2)

## 2019-03-09 LAB — GLUCOSE, CAPILLARY
Glucose-Capillary: 98 mg/dL (ref 70–99)
Glucose-Capillary: 79 mg/dL (ref 70–99)
Glucose-Capillary: 81 mg/dL (ref 70–99)

## 2019-03-09 LAB — PHOSPHORUS: Phosphorus: 2.8 mg/dL (ref 2.5–4.6)

## 2019-03-09 LAB — CBG MONITORING, ED: Glucose-Capillary: 120 mg/dL — ABNORMAL HIGH (ref 70–99)

## 2019-03-09 LAB — SARS CORONAVIRUS 2 BY RT PCR (HOSPITAL ORDER, PERFORMED IN ~~LOC~~ HOSPITAL LAB): SARS Coronavirus 2: NEGATIVE

## 2019-03-09 LAB — MAGNESIUM: Magnesium: 2.3 mg/dL (ref 1.7–2.4)

## 2019-03-09 MED ORDER — HEPARIN SODIUM (PORCINE) 5000 UNIT/ML IJ SOLN
5000.0000 [IU] | Freq: Three times a day (TID) | INTRAMUSCULAR | Status: DC
  Administered 2019-03-09 – 2019-03-10 (×3): 5000 [IU] via SUBCUTANEOUS
  Filled 2019-03-09 (×4): qty 1

## 2019-03-09 MED ORDER — LORAZEPAM 2 MG/ML IJ SOLN
0.5000 mg | Freq: Every day | INTRAMUSCULAR | Status: DC
  Administered 2019-03-09 – 2019-03-10 (×2): 0.5 mg via INTRAVENOUS
  Filled 2019-03-09 (×2): qty 1

## 2019-03-09 MED ORDER — LORAZEPAM 2 MG/ML IJ SOLN
1.0000 mg | Freq: Every day | INTRAMUSCULAR | Status: DC
  Administered 2019-03-09 – 2019-03-11 (×3): 1 mg via INTRAVENOUS
  Filled 2019-03-09 (×3): qty 1

## 2019-03-09 MED ORDER — ZOLPIDEM TARTRATE 5 MG PO TABS
5.0000 mg | ORAL_TABLET | Freq: Every day | ORAL | Status: DC
  Administered 2019-03-09 – 2019-03-11 (×3): 5 mg via ORAL
  Filled 2019-03-09 (×3): qty 1

## 2019-03-09 MED ORDER — HYDRALAZINE HCL 20 MG/ML IJ SOLN: 10.0000 mg | Freq: Three times a day (TID) | INTRAMUSCULAR | Status: DC | PRN

## 2019-03-09 MED ORDER — LORAZEPAM 0.5 MG PO TABS: 0.5000 mg | ORAL_TABLET | Freq: Every day | ORAL | Status: DC

## 2019-03-09 MED ORDER — DIATRIZOATE MEGLUMINE & SODIUM 66-10 % PO SOLN
90.0000 mL | Freq: Once | ORAL | Status: AC
  Administered 2019-03-09: 90 mL via ORAL
  Filled 2019-03-09: qty 90

## 2019-03-09 MED ORDER — LEVOTHYROXINE SODIUM 100 MCG/5ML IV SOLN
25.0000 ug | Freq: Every day | INTRAVENOUS | Status: DC
  Administered 2019-03-10 – 2019-03-12 (×3): 25 ug via INTRAVENOUS
  Filled 2019-03-09 (×3): qty 5

## 2019-03-09 MED ORDER — METHOCARBAMOL 500 MG PO TABS
500.0000 mg | ORAL_TABLET | Freq: Four times a day (QID) | ORAL | Status: DC | PRN
  Administered 2019-03-09 – 2019-03-10 (×2): 500 mg via ORAL
  Filled 2019-03-09 (×2): qty 1

## 2019-03-09 MED ORDER — LORAZEPAM 1 MG PO TABS: 1.0000 mg | ORAL_TABLET | Freq: Every day | ORAL | Status: DC

## 2019-03-09 NOTE — Evaluation (Signed)
Physical Therapy Evaluation Patient Details Name: Sue [name] MRN: [id number] DOB: 06-15-1951 Today's Date: 03/09/2019   History of Present Illness  68 y.o. female admitted on 03/08/19 with partial SBO vs ileus.  Pt with recent admission to [facility] on 6/14-6/16/20 for fall with L tibial plateau fx and proximal fibula fx d/c home with home services.  Pt with significant PMH of TIA, obesity, HTN, COPD with 3 L O2 [place] use at home.  Clinical Impression  Pt was able to get up and walk a short distance to the chair in her room today from elevated bed with the support of the RW.  [facility] and refused any type of rehab except home therapy.  She sates her daughter is staying with her to help out and has PRN support of son and grandson.   PT to follow acutely for deficits listed below.    Follow Up Recommendations Home health PT;Supervision/Assistance - 24 hour    Equipment Recommendations  None recommended by PT    Recommendations for Other Services   NA    Precautions / Restrictions Precautions Precautions: Fall Precaution Comments: recent fall at home  Required Braces or Orthoses: Knee Immobilizer - Left;Other Brace Knee Immobilizer - Left: On when out of bed or walking Other Brace: No order but per ortho follow up not patient to wear knee immobilizer and post op shoe on L side "for a few weeks"(per previous admission notes) Restrictions LLE Weight Bearing: Weight bearing as tolerated      Mobility  Bed Mobility Overal bed mobility: Needs Assistance Bed Mobility: Supine to Sit     Supine to sit: Min assist;HOB elevated     General bed mobility comments: Min assist to help progress left leg to the side of the bed, pt using elevated HOB and rails for leverage, but has hospital bed at home.   Transfers Overall transfer level: Needs assistance Equipment used: Rolling walker (2 wheeled) Transfers: Sit to/from Stand Sit to Stand: Mod assist;From elevated surface          General transfer comment: Mod assist to stand from elevated bed, pt pulling on RW instead of putting hands on bed.  Attempted several times with bed lower and pt unable, needed elevated bed to eventually get up on her feet.   Ambulation/Gait Ambulation/Gait assistance: Min assist Gait Distance (Feet): 6 Feet Assistive device: Rolling walker (2 wheeled) Gait Pattern/deviations: Step-to pattern;Antalgic;Trunk flexed     General Gait Details: Pt leaning on RW with her forearms, reporting that her walking is much more difficult now than when she was at home.  Bil gastrocs started cramping as we turned to the chair.           Balance Overall balance assessment: Needs assistance Sitting-balance support: Feet supported;Bilateral upper extremity supported Sitting balance-Leahy Scale: Fair     Standing balance support: Bilateral upper extremity supported Standing balance-Leahy Scale: Poor Standing balance comment: needs external support from RW and therapist.                              Pertinent Vitals/Pain Pain Assessment: Faces Faces Pain Scale: Hurts whole lot Pain Location: bil lower leg cramping Pain Descriptors / Indicators: Cramping Pain Intervention(s): Limited activity within patient's tolerance;Monitored during session;Repositioned    Home Living Family/patient expects to be discharged to:: Private residence Living Arrangements: Alone Available Help at Discharge: Family;Available 24 hours/day(daughter) Type of Home: [place]  Access: Stairs to enter   [employer] of Steps: 2 Home Layout: One level Home Equipment: Clinical cytogeneticist - 4 wheels;Hospital bed;Wheelchair - manual(WC does not fit around her house)      Prior Function Level of Independence: Independent with assistive device(s)   Gait / Transfers Assistance Needed: uses RW or furniture walks since her fall with fx.            Hand Dominance   Dominant Hand:  Right    Extremity/Trunk Assessment   Upper Extremity Assessment Upper Extremity Assessment: Defer to OT evaluation    Lower Extremity Assessment Lower Extremity Assessment: LLE deficits/detail LLE Deficits / Details: left leg immobilized in KI, unable to lift it against gravity to move it to the side of the bed, ankle at least 3/5.     Cervical / Trunk Assessment Cervical / Trunk Assessment: Normal  Communication   Communication: No difficulties  Cognition Arousal/Alertness: Awake/alert Behavior During Therapy: WFL for tasks assessed/performed Overall Cognitive Status: Within Functional Limits for tasks assessed                                               Assessment/Plan    PT Assessment Patient needs continued PT services  PT Problem List Decreased strength;Decreased activity tolerance;Decreased balance;Decreased mobility;Decreased coordination;Decreased knowledge of use of DME;Decreased safety awareness;Decreased knowledge of precautions;Obesity;Pain       PT Treatment Interventions DME instruction;Gait training;Stair training;Functional mobility training;Therapeutic activities;Therapeutic exercise;Balance training;Patient/family education;Modalities    PT Goals (Current goals can be found in the Care Plan section)  Acute Rehab PT Goals Patient Stated Goal: to go home  PT Goal Formulation: With patient Time For Goal Achievement: 03/23/19 Potential to Achieve Goals: Good    Frequency Min 3X/week   Barriers to discharge Inaccessible home environment         AM-PAC PT "6 Clicks" Mobility  Outcome Measure Help needed turning from your back to your side while in a flat bed without using bedrails?: A Little Help needed moving from lying on your back to sitting on the side of a flat bed without using bedrails?: A Little Help needed moving to and from a bed to a chair (including a wheelchair)?: A Little Help needed standing up from a chair using  your arms (e.g., wheelchair or bedside chair)?: A Lot Help needed to walk in hospital room?: A Little Help needed climbing 3-5 steps with a railing? : Total 6 Click Score: 15    End of Session Equipment Utilized During Treatment: Gait belt Activity Tolerance: Patient limited by fatigue;Patient limited by pain(limited by muscle cramping.) Patient left: in chair;with call bell/phone within reach;with chair alarm set Nurse Communication: Mobility status PT Visit Diagnosis: Muscle weakness (generalized) (M62.81);History of falling (Z91.81);Difficulty in walking, not elsewhere classified (R26.2);Pain Pain - Right/Left: Left Pain - part of body: Leg    Time: 1247-1310 PT Time Calculation (min) (ACUTE ONLY): 23 min   Charges:          Wells Guiles B. Marlaine Arey, PT, DPT  Acute Rehabilitation [phone] pager #[phone] office  @ [facility]: [phone]   PT Evaluation $PT Eval Moderate Complexity: 1 Mod PT Treatments $Therapeutic Activity: 8-22 mins       03/09/2019, 3:07 PM

## 2019-03-09 NOTE — Progress Notes (Signed)
Subjective/Chief Complaint: Feeling little better, no further emesis overnight.  Reports flatus.   Objective: Vital signs in last 24 hours: Temp:  [97.5 F (36.4 C)-98.5 F (36.9 C)] 98.3 F (36.8 C) (06/27 0648) Pulse Rate:  [64-84] 78 (06/27 0648) Resp:  [12-25] 18 (06/27 0648) BP: (100-158)/(64-99) 122/74 (06/27 0648) SpO2:  [92 %-100 %] 96 % (06/27 0648) Weight:  [136.1 kg] 136.1 kg (06/26 1622) Last BM Date: ("unjust week or so")  Intake/Output from previous day: 06/26 0701 - 06/27 0700 In: 1000 [IV Piggyback:1000] Out: -  Intake/Output this shift: No intake/output data recorded.  General appearance: alert and cooperative Resp: Unlabored Cardio: regular rate and rhythm GI: Obese, remains mildly tender in the epigastrium Extremities: Stable swelling to left foot and ankle Skin: Skin color, texture, turgor normal. No rashes or lesions Neurologic: Grossly normal  Lab Results:  Recent Labs    03/08/19 1720 03/09/19 0522  WBC 13.0* 12.1*  HGB 11.1* 10.4*  HCT 36.7 34.6*  PLT 234 197   BMET Recent Labs    03/08/19 1720 03/09/19 0522  NA 134* 135  K 3.8 3.7  CL 92* 97*  CO2 28 28  GLUCOSE 129* 114*  BUN 44* 43*  CREATININE 2.53* 2.43*  CALCIUM 9.2 8.5*   PT/INR No results for input(s): LABPROT, INR in the last 72 hours. ABG No results for input(s): PHART, HCO3 in the last 72 hours.  Invalid input(s): PCO2, PO2  Studies/Results: Ct Abdomen Pelvis Wo Contrast  Result Date: 03/08/2019 CLINICAL DATA:  Abdominal pain for 1 week. EXAM: CT ABDOMEN AND PELVIS WITHOUT CONTRAST TECHNIQUE: Multidetector CT imaging of the abdomen and pelvis was performed following the standard protocol without IV contrast. COMPARISON:  Abdominal ultrasound 11/04/2016 FINDINGS: Lower chest: Centrilobular emphysema and bibasilar scarring. No pleural effusion. Normal heart size. Hepatobiliary: No focal liver abnormality is seen on this unenhanced study. Multiple calcified stones  are present in the gallbladder without evidence of acute cholecystitis. There is no biliary dilatation. Pancreas: Unremarkable. Spleen: Unremarkable. Adrenals/Urinary Tract: Unremarkable adrenal glands. No evidence of renal calculi or hydronephrosis. Slightly small renal size bilaterally. Unremarkable bladder. Stomach/Bowel: The stomach is unremarkable. There is relatively diffuse dilatation of small bowel loops which measure up to nearly 4 cm in diameter with numerous air-fluid levels. A well-defined transition point is not evident, however there is a generalized transition in the central to right lower abdomen with the distal ileum being decompressed. Gas is present in nondilated ascending and transverse colon while the descending and sigmoid colon and rectum are largely collapsed. A few sigmoid colon diverticula are noted without evidence diverticulitis. The appendix is unremarkable. Vascular/Lymphatic: Mildly enlarged lymph nodes in the porta hepatis measuring up to 1.4 cm in short axis, likely reactive and related to the patient's history of chronic hepatitis C infection. Aortic atherosclerosis without aneurysm. Reproductive: Status post hysterectomy. No adnexal masses. Other: No intraperitoneal free fluid. Musculoskeletal: Advanced lower lumbar facet arthrosis with grade 1 anterolisthesis of L4 on L5. IMPRESSION: 1. Diffuse small bowel dilatation with decompressed distal ileum consistent with obstruction. 2. Cholelithiasis. 3. Aortic Atherosclerosis (ICD10-I70.0) and Emphysema (ICD10-J43.9). Electronically Signed   By: Logan Bores M.D.   On: 03/08/2019 19:51    Anti-infectives: Anti-infectives (From admission, onward)   None      Assessment/Plan: s/p * No surgery found * Partial small bowel obstruction versus ileus.  Will start small bowel protocol today.  She can take the contrast by mouth.  Continue bowel rest, fluid resuscitation and supportive  care per primary team  LOS: 1 day    Clovis Riley 03/09/2019

## 2019-03-09 NOTE — Progress Notes (Signed)
PROGRESS NOTE  Sue [name] QVZ:[id number] DOB: 1951/05/15 DOA: 03/08/2019 PCP: Ann Held, DO  HPI/Recap of past 24 hours: HPI from Dr Sue [name] is a 68 y.o. 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  Patient was in a usual state of health until hospitalization for fall with a fibular fracture.  She had been taking narcotic pain medication for pain control.  Reports no bowel movement in the past week.  For the last day she has had multiple episodes of bilious vomiting associated with abdominal pain, bloating.  Symptoms have been refractory to enemas and stool softeners. Patient denies fevers/chills, weakness, dizziness, chest pain, shortness of breath, dysuria/frequency, changes in mental status. In the ED, CT abdomen showed SBO. Pt admitted for further management.   Today, patient denies worsening abdominal pain, no further nausea/vomiting noted.  Patient still complaining of left lower extremity pain from recent fracture.   Assessment/Plan: Active Problems:   Small bowel obstruction (HCC)  Small bowel obstruction Vs ileus Ongoing Couldn't tolerate NGT CT abdomen showed possible small bowel obstruction General surgery on board: SBO protocol, bowel rest, IVF Continue IVF, NPO except sips with pschy meds, pain management with non-opoid meds  AKI on CKD stage III Baseline creatinine about 1.7 Continue IV fluids Avoid nephrotoxic medications, hold home Bumex Strict I's and O's Daily BMP  Leukocytosis Likely due to reactive Monitor closely, daily CBC  Macrocytic anemia Check vitamin B-12, folate levels Daily CBC  Hypertension Hold home metoprolol, torsemide IV hydralazine PRN  Diabetes mellitus type 2 SSI, Accu-Cheks, hypoglycemic protocol Hold home regimen  Hypothyroidism Switch to IV Synthroid  Left fibula  non-displaced fracture Follow up with orthopedics Continue Knee immobilizer PT  COPD Continue O2, nebulizers, Flonase  Anxiety/depression/insomnia Continue buspirone, lorazepam, quetiapine, Ambien  Morbid obesity Lifestyle modification advised          Malnutrition Type:      Malnutrition Characteristics:      Nutrition Interventions:       Estimated body mass index is 54.87 kg/m as calculated from the following:   Height as of this encounter: 5\' 2"  (1.575 m).   Weight as of this encounter: 136.1 kg.     Code Status: Full  Family Communication: None at bedside  Disposition Plan: To be determined   Consultants:  General surgery  Procedures:  None  Antimicrobials:  None  DVT prophylaxis: Heparin   Objective: Vitals:   03/08/19 2330 03/08/19 2349 03/09/19 0222 03/09/19 0648  BP: 100/64 124/65 117/78 122/74  Pulse: 78 81 79 78  Resp:  (!) 21 18 18   Temp:   (!) 97.5 F (36.4 C) 98.3 F (36.8 C)  TempSrc:   Oral Oral  SpO2: 93% 100% 98% 96%  Weight:      Height:        Intake/Output Summary (Last 24 hours) at 03/09/2019 1548 Last data filed at 03/09/2019 1514 Gross per 24 hour  Intake 1888.27 ml  Output --  Net 1888.27 ml   Filed Weights   03/08/19 1622  Weight: 136.1 kg    Exam:  General: NAD   Cardiovascular: S1, S2 present  Respiratory:  CTA B  Abdomen: Soft, +tender, obese, bowel sounds present  Musculoskeletal: No bilateral pedal edema noted  Skin: Normal  Psychiatry: Normal mood   Data Reviewed: CBC: Recent Labs  Lab 03/08/19 1720 03/09/19 0522  WBC 13.0* 12.1*  NEUTROABS 8.9*  --   HGB 11.1* 10.4*  HCT 36.7 34.6*  MCV 101.7* 102.7*  PLT 234 951   Basic Metabolic Panel: Recent Labs  Lab 03/08/19 1720 03/09/19 0522  NA 134* 135  K 3.8 3.7  CL 92* 97*  CO2 28 28  GLUCOSE 129* 114*  BUN 44* 43*  CREATININE 2.53* 2.43*  CALCIUM 9.2 8.5*  MG  --  2.3  PHOS  --  2.8   GFR: Estimated  Creatinine Clearance: 30.4 mL/min (A) (by C-G formula based on SCr of 2.43 mg/dL (H)). Liver Function Tests: Recent Labs  Lab 03/08/19 1720  AST 22  ALT 17  ALKPHOS 88  BILITOT 0.4  PROT 9.1*  ALBUMIN 3.4*   Recent Labs  Lab 03/08/19 1720  LIPASE 59*   No results for input(s): AMMONIA in the last 168 hours. Coagulation Profile: No results for input(s): INR, PROTIME in the last 168 hours. Cardiac Enzymes: No results for input(s): CKTOTAL, CKMB, CKMBINDEX, TROPONINI in the last 168 hours. BNP (last 3 results) No results for input(s): PROBNP in the last 8760 hours. HbA1C: No results for input(s): HGBA1C in the last 72 hours. CBG: Recent Labs  Lab 03/08/19 1622 03/09/19 0011 03/09/19 0756 03/09/19 1203  GLUCAP 131* 120* 98 79   Lipid Profile: No results for input(s): CHOL, HDL, LDLCALC, TRIG, CHOLHDL, LDLDIRECT in the last 72 hours. Thyroid Function Tests: No results for input(s): TSH, T4TOTAL, FREET4, T3FREE, THYROIDAB in the last 72 hours. Anemia Panel: No results for input(s): VITAMINB12, FOLATE, FERRITIN, TIBC, IRON, RETICCTPCT in the last 72 hours. Urine analysis:    Component Value Date/Time   COLORURINE YELLOW 08/15/2018 2201   APPEARANCEUR CLEAR 08/15/2018 2201   LABSPEC <1.005 (L) 08/15/2018 2201   PHURINE 5.5 08/15/2018 2201   GLUCOSEU >=500 (A) 08/15/2018 2201   GLUCOSEU NEGATIVE 11/11/2013 1353   HGBUR NEGATIVE 08/15/2018 2201   BILIRUBINUR NEGATIVE 08/15/2018 2201   BILIRUBINUR neg 10/27/2015 1355   KETONESUR NEGATIVE 08/15/2018 2201   PROTEINUR NEGATIVE 08/15/2018 2201   UROBILINOGEN 0.2 10/27/2015 1355   UROBILINOGEN 0.2 07/29/2014 2300   NITRITE NEGATIVE 08/15/2018 2201   LEUKOCYTESUR NEGATIVE 08/15/2018 2201   Sepsis Labs: @LABRCNTIP (procalcitonin:4,lacticidven:4)  ) Recent Results (from the past 240 hour(s))  SARS Coronavirus 2 (CEPHEID - Performed in [facility] hospital lab), Hosp Order     Status: None   Collection Time: 03/09/19   1:58 AM   Specimen: Nasopharyngeal Swab  Result Value Ref Range Status   SARS Coronavirus 2 NEGATIVE NEGATIVE Final    Comment: (NOTE) If result is NEGATIVE SARS-CoV-2 target nucleic acids are NOT DETECTED. The SARS-CoV-2 RNA is generally detectable in upper and lower  respiratory specimens during the acute phase of infection. The lowest  concentration of SARS-CoV-2 viral copies this assay can detect is 250  copies / mL. A negative result does not preclude SARS-CoV-2 infection  and should not be used as the sole basis for treatment or other  patient management decisions.  A negative result may occur with  improper specimen collection / handling, submission of specimen other  than nasopharyngeal swab, presence of viral mutation(s) within the  areas targeted by this assay, and inadequate number of viral copies  (<250 copies / mL). A negative result must be combined with clinical  observations, patient history, and epidemiological information. If result is POSITIVE SARS-CoV-2 target nucleic acids are DETECTED. The SARS-CoV-2 RNA is generally detectable in upper and lower  respiratory specimens  dur ing the acute phase of infection.  Positive  results are indicative of active infection with SARS-CoV-2.  Clinical  correlation with patient history and other diagnostic information is  necessary to determine patient infection status.  Positive results do  not rule out bacterial infection or co-infection with other viruses. If result is PRESUMPTIVE POSTIVE SARS-CoV-2 nucleic acids MAY BE PRESENT.   A presumptive positive result was obtained on the submitted specimen  and confirmed on repeat testing.  While 2019 novel coronavirus  (SARS-CoV-2) nucleic acids may be present in the submitted sample  additional confirmatory testing may be necessary for epidemiological  and / or clinical management purposes  to differentiate between  SARS-CoV-2 and other Sarbecovirus currently known to infect  humans.  If clinically indicated additional testing with an alternate test  methodology [phone]) is advised. The SARS-CoV-2 RNA is generally  detectable in upper and lower respiratory sp ecimens during the acute  phase of infection. The expected result is Negative. Fact Sheet for Patients:  StrictlyIdeas.no Fact Sheet for Healthcare Providers: BankingDealers.co.za This test is not yet approved or cleared by the [place] FDA and has been authorized for detection and/or diagnosis of SARS-CoV-2 by FDA under an Emergency Use Authorization (EUA).  This EUA will remain in effect (meaning this test can be used) for the duration of the COVID-19 declaration under Section 564(b)(1) of the Act, 21 U.S.C. section 360bbb-3(b)(1), unless the authorization is terminated or revoked sooner. Performed at [facility], [place] [address] [place], [place] [address]       Studies: Ct Abdomen Pelvis Wo Contrast  Result Date: 03/08/2019 CLINICAL DATA:  Abdominal pain for 1 week. EXAM: CT ABDOMEN AND PELVIS WITHOUT CONTRAST TECHNIQUE: Multidetector CT imaging of the abdomen and pelvis was performed following the standard protocol without IV contrast. COMPARISON:  Abdominal ultrasound 11/04/2016 FINDINGS: Lower chest: Centrilobular emphysema and bibasilar scarring. No pleural effusion. Normal heart size. Hepatobiliary: No focal liver abnormality is seen on this unenhanced study. Multiple calcified stones are present in the gallbladder without evidence of acute cholecystitis. There is no biliary dilatation. Pancreas: Unremarkable. Spleen: Unremarkable. Adrenals/Urinary Tract: Unremarkable adrenal glands. No evidence of renal calculi or hydronephrosis. Slightly small renal size bilaterally. Unremarkable bladder. Stomach/Bowel: The stomach is unremarkable. There is relatively diffuse dilatation of small bowel loops which measure up to nearly 4 cm in  diameter with numerous air-fluid levels. A well-defined transition point is not evident, however there is a generalized transition in the central to right lower abdomen with the distal ileum being decompressed. Gas is present in nondilated ascending and transverse colon while the descending and sigmoid colon and rectum are largely collapsed. A few sigmoid colon diverticula are noted without evidence diverticulitis. The appendix is unremarkable. Vascular/Lymphatic: Mildly enlarged lymph nodes in the porta hepatis measuring up to 1.4 cm in short axis, likely reactive and related to the patient's history of chronic hepatitis C infection. Aortic atherosclerosis without aneurysm. Reproductive: Status post hysterectomy. No adnexal masses. Other: No intraperitoneal free fluid. Musculoskeletal: Advanced lower lumbar facet arthrosis with grade 1 anterolisthesis of L4 on L5. IMPRESSION: 1. Diffuse small bowel dilatation with decompressed distal ileum consistent with obstruction. 2. Cholelithiasis. 3. Aortic Atherosclerosis (ICD10-I70.0) and Emphysema (ICD10-J43.9). Electronically Signed   By: Logan Bores M.D.   On: 03/08/2019 19:51    Scheduled Meds:  busPIRone  15 mg Oral BID   heparin injection (subcutaneous)  5,000 Units Subcutaneous Q8H   insulin aspart  0-15 Units Subcutaneous Q4H  insulin glargine  15 Units Subcutaneous QHS   [START ON 03/10/2019] levothyroxine  25 mcg Intravenous Daily   LORazepam  0.5 mg Intravenous QPC lunch   LORazepam  1 mg Intravenous QHS   QUEtiapine  100 mg Oral QHS   zolpidem  5 mg Oral QHS    Continuous Infusions:  sodium chloride 75 mL/hr at 03/09/19 1514     LOS: 1 day     Alma Friendly, MD [facility]  If 7PM-7AM, please contact night-coverage [email or web address] 03/09/2019, 3:48 PM

## 2019-03-09 NOTE — ED Notes (Signed)
Pt placed on cardiac monitoring, given purewick for BR needs due to foot pain and placed on impatient bed while holding in ED.

## 2019-03-09 NOTE — Progress Notes (Signed)
Pt had a huge amount of watery stool with scattered lumps ca 1800.

## 2019-03-10 ENCOUNTER — Inpatient Hospital Stay (HOSPITAL_COMMUNITY): Payer: Medicare HMO

## 2019-03-10 DIAGNOSIS — R52 Pain, unspecified: Secondary | ICD-10-CM

## 2019-03-10 LAB — BASIC METABOLIC PANEL
Anion gap: 9 (ref 5–15)
BUN: 36 mg/dL — ABNORMAL HIGH (ref 8–23)
CO2: 29 mmol/L (ref 22–32)
Calcium: 8.4 mg/dL — ABNORMAL LOW (ref 8.9–10.3)
Chloride: 102 mmol/L (ref 98–111)
Creatinine, Ser: 1.78 mg/dL — ABNORMAL HIGH (ref 0.44–1.00)
GFR calc Af Amer: 34 mL/min — ABNORMAL LOW (ref >60)
GFR calc non Af Amer: 29 mL/min — ABNORMAL LOW (ref >60)
Glucose, Bld: 127 mg/dL — ABNORMAL HIGH (ref 70–99)
Potassium: 3.4 mmol/L — ABNORMAL LOW (ref 3.5–5.1)
Sodium: 140 mmol/L (ref 135–145)

## 2019-03-10 LAB — GLUCOSE, CAPILLARY
Glucose-Capillary: 119 mg/dL — ABNORMAL HIGH (ref 70–99)
Glucose-Capillary: 121 mg/dL — ABNORMAL HIGH (ref 70–99)
Glucose-Capillary: 125 mg/dL — ABNORMAL HIGH (ref 70–99)
Glucose-Capillary: 128 mg/dL — ABNORMAL HIGH (ref 70–99)
Glucose-Capillary: 136 mg/dL — ABNORMAL HIGH (ref 70–99)
Glucose-Capillary: 95 mg/dL (ref 70–99)

## 2019-03-10 LAB — CBC WITH DIFFERENTIAL/PLATELET
Abs Immature Granulocytes: 0.15 10*3/uL — ABNORMAL HIGH (ref 0.00–0.07)
Basophils Absolute: 0 10*3/uL (ref 0.0–0.1)
Basophils Relative: 0 %
Eosinophils Absolute: 0.2 10*3/uL (ref 0.0–0.5)
Eosinophils Relative: 2 %
HCT: 34 % — ABNORMAL LOW (ref 36.0–46.0)
Hemoglobin: 10.1 g/dL — ABNORMAL LOW (ref 12.0–15.0)
Immature Granulocytes: 1 %
Lymphocytes Relative: 25 %
Lymphs Abs: 2.7 10*3/uL (ref 0.7–4.0)
MCH: 30.6 pg (ref 26.0–34.0)
MCHC: 29.7 g/dL — ABNORMAL LOW (ref 30.0–36.0)
MCV: 103 fL — ABNORMAL HIGH (ref 80.0–100.0)
Monocytes Absolute: 0.8 10*3/uL (ref 0.1–1.0)
Monocytes Relative: 7 %
Neutro Abs: 7.1 10*3/uL (ref 1.7–7.7)
Neutrophils Relative %: 65 %
Platelets: 197 10*3/uL (ref 150–400)
RBC: 3.3 MIL/uL — ABNORMAL LOW (ref 3.87–5.11)
RDW: 13.4 % (ref 11.5–15.5)
WBC: 10.9 10*3/uL — ABNORMAL HIGH (ref 4.0–10.5)
nRBC: 0.2 % (ref 0.0–0.2)

## 2019-03-10 LAB — HEPARIN LEVEL (UNFRACTIONATED): Heparin Unfractionated: 0.54 IU/mL (ref 0.30–0.70)

## 2019-03-10 LAB — VITAMIN B12: Vitamin B-12: 482 pg/mL (ref 180–914)

## 2019-03-10 MED ORDER — HEPARIN BOLUS VIA INFUSION
6000.0000 [IU] | Freq: Once | INTRAVENOUS | Status: AC
  Administered 2019-03-10: 6000 [IU] via INTRAVENOUS
  Filled 2019-03-10: qty 6000

## 2019-03-10 MED ORDER — HEPARIN (PORCINE) 25000 UT/250ML-% IV SOLN
1750.0000 [IU]/h | INTRAVENOUS | Status: DC
  Administered 2019-03-10 – 2019-03-12 (×2): 1500 [IU]/h via INTRAVENOUS
  Filled 2019-03-10 (×4): qty 250

## 2019-03-10 MED ORDER — RIVAROXABAN 15 MG PO TABS: 15.0000 mg | ORAL_TABLET | Freq: Two times a day (BID) | ORAL | Status: DC

## 2019-03-10 MED ORDER — METOPROLOL SUCCINATE ER 50 MG PO TB24
100.0000 mg | ORAL_TABLET | Freq: Every day | ORAL | Status: DC
  Administered 2019-03-10 – 2019-03-12 (×3): 100 mg via ORAL
  Filled 2019-03-10 (×3): qty 2

## 2019-03-10 MED ORDER — RIVAROXABAN (XARELTO) EDUCATION KIT FOR DVT/PE PATIENTS
PACK | Freq: Once | Status: DC
  Filled 2019-03-10: qty 1

## 2019-03-10 MED ORDER — POTASSIUM CHLORIDE 10 MEQ/100ML IV SOLN
10.0000 meq | INTRAVENOUS | Status: AC
  Administered 2019-03-10 (×4): 10 meq via INTRAVENOUS
  Filled 2019-03-10 (×4): qty 100

## 2019-03-10 NOTE — Progress Notes (Signed)
Subjective/Chief Complaint: Feels thirsty and hungry.  Reports flatus and liquid bms. C/o Right dorsal foot cramping.    Objective: Vital signs in last 24 hours: Temp:  [98 F (36.7 C)-98.1 F (36.7 C)] 98 F (36.7 C) (06/28 0408) Pulse Rate:  [84-93] 93 (06/28 0408) Resp:  [14-20] 20 (06/28 0408) BP: (142-145)/(69-80) 142/80 (06/28 0408) SpO2:  [95 %-98 %] 95 % (06/28 0408) Last BM Date: 03/02/19  Intake/Output from previous day: 06/27 0701 - 06/28 0700 In: 1991.9 [I.V.:1991.9] Out: -  Intake/Output this shift: No intake/output data recorded.  General appearance: alert and cooperative Resp: Unlabored Cardio: regular rate and rhythm GI: Obese, epigastrium is softer and no longer tender. Extremities: Stable swelling to left foot and ankle. No palpable abnormality to right foot.  Skin: Skin color, texture, turgor normal. No rashes or lesions Neurologic: Grossly normal  Lab Results:  Recent Labs    03/09/19 0522 03/10/19 0545  WBC 12.1* 10.9*  HGB 10.4* 10.1*  HCT 34.6* 34.0*  PLT 197 197   BMET Recent Labs    03/09/19 0522 03/10/19 0545  NA 135 140  K 3.7 3.4*  CL 97* 102  CO2 28 29  GLUCOSE 114* 127*  BUN 43* 36*  CREATININE 2.43* 1.78*  CALCIUM 8.5* 8.4*   PT/INR No results for input(s): LABPROT, INR in the last 72 hours. ABG No results for input(s): PHART, HCO3 in the last 72 hours.  Invalid input(s): PCO2, PO2  Studies/Results: Ct Abdomen Pelvis Wo Contrast  Result Date: 03/08/2019 CLINICAL DATA:  Abdominal pain for 1 week. EXAM: CT ABDOMEN AND PELVIS WITHOUT CONTRAST TECHNIQUE: Multidetector CT imaging of the abdomen and pelvis was performed following the standard protocol without IV contrast. COMPARISON:  Abdominal ultrasound 11/04/2016 FINDINGS: Lower chest: Centrilobular emphysema and bibasilar scarring. No pleural effusion. Normal heart size. Hepatobiliary: No focal liver abnormality is seen on this unenhanced study. Multiple calcified  stones are present in the gallbladder without evidence of acute cholecystitis. There is no biliary dilatation. Pancreas: Unremarkable. Spleen: Unremarkable. Adrenals/Urinary Tract: Unremarkable adrenal glands. No evidence of renal calculi or hydronephrosis. Slightly small renal size bilaterally. Unremarkable bladder. Stomach/Bowel: The stomach is unremarkable. There is relatively diffuse dilatation of small bowel loops which measure up to nearly 4 cm in diameter with numerous air-fluid levels. A well-defined transition point is not evident, however there is a generalized transition in the central to right lower abdomen with the distal ileum being decompressed. Gas is present in nondilated ascending and transverse colon while the descending and sigmoid colon and rectum are largely collapsed. A few sigmoid colon diverticula are noted without evidence diverticulitis. The appendix is unremarkable. Vascular/Lymphatic: Mildly enlarged lymph nodes in the porta hepatis measuring up to 1.4 cm in short axis, likely reactive and related to the patient's history of chronic hepatitis C infection. Aortic atherosclerosis without aneurysm. Reproductive: Status post hysterectomy. No adnexal masses. Other: No intraperitoneal free fluid. Musculoskeletal: Advanced lower lumbar facet arthrosis with grade 1 anterolisthesis of L4 on L5. IMPRESSION: 1. Diffuse small bowel dilatation with decompressed distal ileum consistent with obstruction. 2. Cholelithiasis. 3. Aortic Atherosclerosis (ICD10-I70.0) and Emphysema (ICD10-J43.9). Electronically Signed   By: Logan Bores M.D.   On: 03/08/2019 19:51   Dg Abd Portable 1v-small Bowel Obstruction Protocol-initial, 8 Hr Delay  Result Date: 03/09/2019 CLINICAL DATA:  Small-bowel obstruction EXAM: PORTABLE ABDOMEN - 1 VIEW COMPARISON:  None. FINDINGS: Again noted are dilated loops of small bowel scattered throughout the abdomen. Oral contrast is seen in the colon.  No pneumatosis or free air.  There is a moderate amount of stool in the colon. IMPRESSION: Oral contrast is seen to the level of the rectum. There are persistently dilated loops of small bowel scattered throughout the abdomen. Electronically Signed   By: Constance Holster M.D.   On: 03/09/2019 18:51    Anti-infectives: Anti-infectives (From admission, onward)   None      Assessment/Plan: s/p * No surgery found * Partial small bowel obstruction versus ileus.  Contrast in colon on first film after drinking it. Having bowel movements and flatus, feels hungry.  Try clears this morning. Advance as tolerated. Continue fluid resuscitation and supportive care per primary team- cr seems to be back to baseline. Once tolerating liquid diet, ok to discharge when medically cleared.   LOS: 2 days    Sue [name] 03/10/2019

## 2019-03-10 NOTE — Progress Notes (Signed)
03/10/2019  1406  Notified MD, per ultrasound tech Patient is positive for a DVT behind left knee.

## 2019-03-10 NOTE — Progress Notes (Signed)
[place] for IV heparin Indication: new DVT  Allergies  Allergen Reactions  . Norvasc [Amlodipine Besylate]     Marked swelling    Patient Measurements: Height: '5\' 2"'  (157.5 cm) Weight: 300 lb (136.1 kg) IBW/kg (Calculated) : 50.1  Heparin dosing weight: 84.7 kg  Vital Signs: Temp: 98.7 F (37.1 C) (06/28 1412) Temp Source: Oral (06/28 1412) BP: 103/52 (06/28 1412) Pulse Rate: 104 (06/28 1412)  Labs: Recent Labs    03/08/19 1720 03/09/19 0522 03/10/19 0545  HGB 11.1* 10.4* 10.1*  HCT 36.7 34.6* 34.0*  PLT 234 197 197  CREATININE 2.53* 2.43* 1.78*    Estimated Creatinine Clearance: 41.5 mL/min (A) (by C-G formula based on SCr of 1.78 mg/dL (H)).   Medical History: Past Medical History:  Diagnosis Date  . Allergic rhinitis   . Depression   . Emphysema of lung ([place])    3L home O2  . GERD (gastroesophageal reflux disease)   . Hypertension   . Hypothyroidism   . Obesity, morbid, BMI 50 or higher ([place])   . Stroke [facility]) 2016   TIA   . Urine incontinence     Medications:  Medications Prior to Admission  Medication Sig Dispense Refill Last Dose  . albuterol (PROVENTIL HFA;VENTOLIN HFA) 108 (90 Base) MCG/ACT inhaler Inhale 1-2 puffs into the lungs every 6 (six) hours as needed for wheezing or shortness of breath.   unknown  . albuterol (PROVENTIL) (2.5 MG/3ML) 0.083% nebulizer solution Take 3 mLs (2.5 mg total) by nebulization every 6 (six) hours as needed for wheezing or shortness of breath. 150 mL 1   . busPIRone (BUSPAR) 15 MG tablet Take 15 mg by mouth 2 (two) times daily.   4 03/06/2019  . famotidine (PEPCID) 20 MG tablet Take 20 mg by mouth daily as needed for heartburn or indigestion.   03/07/2019 at Unknown time  . fluticasone (FLONASE) 50 MCG/ACT nasal spray Place 2 sprays into both nostrils daily as needed for allergies.   unknown  . glipiZIDE (GLUCOTROL) 5 MG tablet Take 0.5 tablets (2.5 mg total) by mouth 2 (two)  times daily before a meal. 30 tablet 11 03/06/2019  . insulin glargine (LANTUS) 100 UNIT/ML injection Inject 0.3 mLs (30 Units total) into the skin at bedtime. 10 mL 5 03/04/2019 at Unknown time  . levothyroxine (SYNTHROID) 50 MCG tablet Take 1 tablet (50 mcg total) by mouth daily. 90 tablet 0 03/06/2019  . LORazepam (ATIVAN) 1 MG tablet Take 0.5-1.5 mg by mouth See admin instructions. Take 0.79m by mouth in the morning and 1.511mby mouth at bedtime as needed for anxiety.   03/06/2019  . metFORMIN (GLUCOPHAGE) 500 MG tablet Take 2 tablets (1,000 mg total) by mouth 2 (two) times daily with a meal. 120 tablet 11 03/06/2019  . methocarbamol (ROBAXIN) 500 MG tablet Take 1 tablet (500 mg total) by mouth every 6 (six) hours as needed for muscle spasms. 10 tablet 0 Past Week at Unknown time  . metoprolol succinate (TOPROL-XL) 50 MG 24 hr tablet TAKE 2 TABLETS BY MOUTH ONCE DAILY IN THE MORNING WITH A MEAL OR IMMEDIATELY FOLLOWING A MEAL (Patient taking differently: Take 100 mg by mouth daily. ) 180 tablet 1 03/06/2019  . ondansetron (ZOFRAN) 4 MG tablet Take 1 tablet (4 mg total) by mouth every 8 (eight) hours as needed for nausea or vomiting. 20 tablet 0 03/07/2019 at Unknown time  . potassium chloride SA (K-DUR) 20 MEQ tablet Take 1 tablet (20  mEq total) by mouth daily. 30 tablet 0 03/06/2019  . QUEtiapine (SEROQUEL) 100 MG tablet Take 100 mg by mouth at bedtime.   03/06/2019  . torsemide (DEMADEX) 20 MG tablet Take 1 tablet (20 mg total) by mouth daily. (Patient taking differently: Take 40 mg by mouth daily. Taking 2 tablets(1m) daily) 180 tablet 3 03/06/2019  . traMADol (ULTRAM) 50 MG tablet Take 50 mg by mouth every 6 (six) hours as needed for moderate pain.   03/07/2019 at Unknown time  . zolpidem (AMBIEN) 5 MG tablet Take 2 tablets (10 mg total) by mouth at bedtime.   03/07/2019 at Unknown time  . blood glucose meter kit and supplies KIT Dispense based on patient and insurance preference. Use up to four times  daily as directed. (FOR ICD-9 250.00, 250.01). 1 each 0   . Insulin Syringe-Needle U-100 28G X 5/16" 0.5 ML MISC Use with lantus once a day 100 each 1   . linaclotide (LINZESS) 145 MCG CAPS capsule Take 1 capsule (145 mcg total) by mouth daily before breakfast. (Patient not taking: Reported on 03/08/2019) 30 capsule 0 03/06/2019  . OXYGEN Inhale 3 L into the lungs continuous.       Scheduled:  . busPIRone  15 mg Oral BID  . insulin aspart  0-15 Units Subcutaneous Q4H  . levothyroxine  25 mcg Intravenous Daily  . LORazepam  0.5 mg Intravenous QPC lunch  . LORazepam  1 mg Intravenous QHS  . metoprolol succinate  100 mg Oral Daily  . QUEtiapine  100 mg Oral QHS  . [START ON 03/11/2019] rivaroxaban   Does not apply Once  . Rivaroxaban  15 mg Oral BID WC  . zolpidem  5 mg Oral QHS   PRN: acetaminophen **OR** acetaminophen, fluticasone, hydrALAZINE, ipratropium-albuterol, methocarbamol, ondansetron **OR** ondansetron (ZOFRAN) IV  Assessment: 89yoF with PMH HTN, CVA, GERD, COPD on O2, hypothyroid, admitted for SBO vs ileus. Found to have LLE DVT on day 2 of admission. Pharmacy initially requested to dose Xarelto, but d/t patient weight (136 kg, BMI 54), will start heparin instead likely  bridging to warfarin   Baseline INR, aPTT: not done  Prior anticoagulation: SQ heparin, LD 0600   Significant events:  Today, 03/10/2019:  CBC: hgb slightly low but stable, Plt stable WNL  No bleeding or infusion issues per nursing  Goal of Therapy: Heparin level 0.3-0.7 units/ml Monitor platelets by anticoagulation protocol: Yes  Plan:  Heparin 6000 units IV bolus x 1  Heparin 1500 units/hr IV infusion  Check heparin level 6 hrs after start  Daily CBC, daily heparin level once stable  Monitor for signs of bleeding or thrombosis   DReuel Boom PharmD, BCPS [phone]/28/2020, 3:30 PM

## 2019-03-10 NOTE — Progress Notes (Addendum)
PROGRESS NOTE  Sue [name]:[id number] DOB: 1950/10/23 DOA: 03/08/2019 PCP: Ann Held, DO  HPI/Recap of past 24 hours: HPI from Dr Ara Kussmaul Sue [name] is a 68 y.o. 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  Patient was in a usual state of health until hospitalization for fall with a fibular fracture.  She had been taking narcotic pain medication for pain control.  Reports no bowel movement in the past week.  For the last day she has had multiple episodes of bilious vomiting associated with abdominal pain, bloating.  Symptoms have been refractory to enemas and stool softeners. Patient denies fevers/chills, weakness, dizziness, chest pain, shortness of breath, dysuria/frequency, changes in mental status. In the ED, CT abdomen showed SBO. Pt admitted for further management.   Today, patient reports improvement in abdominal pain, having loose BMs and passing gas.  Advance diet to clear liquid.  Reports of right lower extremity pain.   Assessment/Plan: Active Problems:   Small bowel obstruction (HCC)  Small bowel obstruction Vs ileus Improving Couldn't tolerate NGT CT abdomen showed possible small bowel obstruction General surgery on board: Advance diet Pain management with non-opoid meds  LLE DVT Bilateral vascular duplex ultrasound showed LLE DVT Likely from recent fracture and poor mobilzing Not a candidate for NOAC due to BMI (morbid obesity) Will start Heparin drip, plan to transition to coumadin  AKI on CKD stage III Improving Baseline creatinine about 1.7 Avoid nephrotoxic medications, hold home Bumex Strict I's and O's Daily BMP  Leukocytosis Resolving Likely due to reactive Monitor closely, daily CBC  Macrocytic anemia Vitamin B-12, 482, folate levels pending Daily CBC  Hypokalemia Replace PRN  Hypertension  Restart metoprolol, hold torsemide IV hydralazine PRN  Diabetes mellitus type 2 SSI, Accu-Cheks, hypoglycemic protocol Hold home regimen  Hypothyroidism Switch to IV Synthroid  Left fibula non-displaced fracture Follow up with orthopedics Continue Knee immobilizer PT  COPD Continue O2, nebulizers, Flonase  Anxiety/depression/insomnia Continue buspirone, lorazepam, quetiapine, Ambien  Morbid obesity Lifestyle modification advised          Malnutrition Type:      Malnutrition Characteristics:      Nutrition Interventions:       Estimated body mass index is 54.87 kg/m as calculated from the following:   Height as of this encounter: 5\' 2"  (1.575 m).   Weight as of this encounter: 136.1 kg.     Code Status: Full  Family Communication: None at bedside  Disposition Plan: To be determined   Consultants:  General surgery  Procedures:  None  Antimicrobials:  None  DVT prophylaxis: Heparin   Objective: Vitals:   03/09/19 0222 03/09/19 0648 03/09/19 2015 03/10/19 0408  BP: 117/78 122/74 (!) 145/69 (!) 142/80  Pulse: 79 78 84 93  Resp: 18 18 14 20   Temp: (!) 97.5 F (36.4 C) 98.3 F (36.8 C) 98.1 F (36.7 C) 98 F (36.7 C)  TempSrc: Oral Oral Oral Oral  SpO2: 98% 96% 98% 95%  Weight:      Height:        Intake/Output Summary (Last 24 hours) at 03/10/2019 1340 Last data filed at 03/10/2019 1200 Gross per 24 hour  Intake 2170.44 ml  Output 600 ml  Net 1570.44 ml   Filed Weights   03/08/19 1622  Weight: 136.1 kg    Exam:  General: NAD   Cardiovascular: S1, S2 present  Respiratory: CTAB  Abdomen: Soft, nontender, obese, bowel sounds present  Musculoskeletal: No bilateral pedal edema noted  Skin: Normal  Psychiatry: Normal mood   Data Reviewed: CBC: Recent Labs  Lab 03/08/19 1720 03/09/19 0522 03/10/19 0545  WBC 13.0* 12.1* 10.9*  NEUTROABS 8.9*  --  7.1  HGB 11.1* 10.4* 10.1*  HCT 36.7 34.6* 34.0*  MCV  101.7* 102.7* 103.0*  PLT 234 197 409   Basic Metabolic Panel: Recent Labs  Lab 03/08/19 1720 03/09/19 0522 03/10/19 0545  NA 134* 135 140  K 3.8 3.7 3.4*  CL 92* 97* 102  CO2 28 28 29   GLUCOSE 129* 114* 127*  BUN 44* 43* 36*  CREATININE 2.53* 2.43* 1.78*  CALCIUM 9.2 8.5* 8.4*  MG  --  2.3  --   PHOS  --  2.8  --    GFR: Estimated Creatinine Clearance: 41.5 mL/min (A) (by C-G formula based on SCr of 1.78 mg/dL (H)). Liver Function Tests: Recent Labs  Lab 03/08/19 1720  AST 22  ALT 17  ALKPHOS 88  BILITOT 0.4  PROT 9.1*  ALBUMIN 3.4*   Recent Labs  Lab 03/08/19 1720  LIPASE 59*   No results for input(s): AMMONIA in the last 168 hours. Coagulation Profile: No results for input(s): INR, PROTIME in the last 168 hours. Cardiac Enzymes: No results for input(s): CKTOTAL, CKMB, CKMBINDEX, TROPONINI in the last 168 hours. BNP (last 3 results) No results for input(s): PROBNP in the last 8760 hours. HbA1C: No results for input(s): HGBA1C in the last 72 hours. CBG: Recent Labs  Lab 03/09/19 2012 03/10/19 0005 03/10/19 0405 03/10/19 0740 03/10/19 1153  GLUCAP 81 95 125* 121* 136*   Lipid Profile: No results for input(s): CHOL, HDL, LDLCALC, TRIG, CHOLHDL, LDLDIRECT in the last 72 hours. Thyroid Function Tests: No results for input(s): TSH, T4TOTAL, FREET4, T3FREE, THYROIDAB in the last 72 hours. Anemia Panel: Recent Labs    03/10/19 0545  VITAMINB12 482   Urine analysis:    Component Value Date/Time   COLORURINE YELLOW 08/15/2018 2201   APPEARANCEUR CLEAR 08/15/2018 2201   LABSPEC <1.005 (L) 08/15/2018 2201   PHURINE 5.5 08/15/2018 2201   GLUCOSEU >=500 (A) 08/15/2018 2201   GLUCOSEU NEGATIVE 11/11/2013 1353   HGBUR NEGATIVE 08/15/2018 2201   BILIRUBINUR NEGATIVE 08/15/2018 2201   BILIRUBINUR neg 10/27/2015 1355   KETONESUR NEGATIVE 08/15/2018 2201   PROTEINUR NEGATIVE 08/15/2018 2201   UROBILINOGEN 0.2 10/27/2015 1355   UROBILINOGEN 0.2  07/29/2014 2300   NITRITE NEGATIVE 08/15/2018 2201   LEUKOCYTESUR NEGATIVE 08/15/2018 2201   Sepsis Labs: @LABRCNTIP (procalcitonin:4,lacticidven:4)  ) Recent Results (from the past 240 hour(s))  SARS Coronavirus 2 (CEPHEID - Performed in [place] hospital lab), Hosp Order     Status: None   Collection Time: 03/09/19  1:58 AM   Specimen: Nasopharyngeal Swab  Result Value Ref Range Status   SARS Coronavirus 2 NEGATIVE NEGATIVE Final    Comment: (NOTE) If result is NEGATIVE SARS-CoV-2 target nucleic acids are NOT DETECTED. The SARS-CoV-2 RNA is generally detectable in upper and lower  respiratory specimens during the acute phase of infection. The lowest  concentration of SARS-CoV-2 viral copies this assay can detect is 250  copies / mL. A negative result does not preclude SARS-CoV-2 infection  and should not be used as the sole basis for treatment or other  patient management decisions.  A negative result may occur with  improper specimen collection / handling, submission of specimen other  than nasopharyngeal  swab, presence of viral mutation(s) within the  areas targeted by this assay, and inadequate number of viral copies  (<250 copies / mL). A negative result must be combined with clinical  observations, patient history, and epidemiological information. If result is POSITIVE SARS-CoV-2 target nucleic acids are DETECTED. The SARS-CoV-2 RNA is generally detectable in upper and lower  respiratory specimens dur ing the acute phase of infection.  Positive  results are indicative of active infection with SARS-CoV-2.  Clinical  correlation with patient history and other diagnostic information is  necessary to determine patient infection status.  Positive results do  not rule out bacterial infection or co-infection with other viruses. If result is PRESUMPTIVE POSTIVE SARS-CoV-2 nucleic acids MAY BE PRESENT.   A presumptive positive result was obtained on the submitted specimen  and  confirmed on repeat testing.  While 2019 novel coronavirus  (SARS-CoV-2) nucleic acids may be present in the submitted sample  additional confirmatory testing may be necessary for epidemiological  and / or clinical management purposes  to differentiate between  SARS-CoV-2 and other Sarbecovirus currently known to infect humans.  If clinically indicated additional testing with an alternate test  methodology [phone]) is advised. The SARS-CoV-2 RNA is generally  detectable in upper and lower respiratory sp ecimens during the acute  phase of infection. The expected result is Negative. Fact Sheet for Patients:  StrictlyIdeas.no Fact Sheet for Healthcare Providers: BankingDealers.co.za This test is not yet approved or cleared by the [place] FDA and has been authorized for detection and/or diagnosis of SARS-CoV-2 by FDA under an Emergency Use Authorization (EUA).  This EUA will remain in effect (meaning this test can be used) for the duration of the COVID-19 declaration under Section 564(b)(1) of the Act, 21 U.S.C. section 360bbb-3(b)(1), unless the authorization is terminated or revoked sooner. Performed at [facility], [place] [address], [place], [place] [address]       Studies: Dg Abd Portable 1v-small Bowel Obstruction Protocol-initial, 8 Hr Delay  Result Date: 03/09/2019 CLINICAL DATA:  Small-bowel obstruction EXAM: PORTABLE ABDOMEN - 1 VIEW COMPARISON:  None. FINDINGS: Again noted are dilated loops of small bowel scattered throughout the abdomen. Oral contrast is seen in the colon. No pneumatosis or free air. There is a moderate amount of stool in the colon. IMPRESSION: Oral contrast is seen to the level of the rectum. There are persistently dilated loops of small bowel scattered throughout the abdomen. Electronically Signed   By: Constance Holster M.D.   On: 03/09/2019 18:51    Scheduled Meds: . busPIRone  15 mg  Oral BID  . heparin injection (subcutaneous)  5,000 Units Subcutaneous Q8H  . insulin aspart  0-15 Units Subcutaneous Q4H  . levothyroxine  25 mcg Intravenous Daily  . LORazepam  0.5 mg Intravenous QPC lunch  . LORazepam  1 mg Intravenous QHS  . QUEtiapine  100 mg Oral QHS  . zolpidem  5 mg Oral QHS    Continuous Infusions: . potassium chloride 10 mEq (03/10/19 1335)     LOS: 2 days     Alma Friendly, MD [facility]  If 7PM-7AM, please contact night-coverage [email or web address] 03/10/2019, 1:40 PM

## 2019-03-10 NOTE — Progress Notes (Signed)
[place] for IV heparin Indication: new DVT  Allergies  Allergen Reactions  . Norvasc [Amlodipine Besylate]     Marked swelling    Patient Measurements: Height: _0  (157.5 cm) Weight: 300 lb (136.1 kg) IBW/kg (Calculated) : 50.1  Heparin dosing weight: 84.7 kg  Vital Signs: Temp: 98.9 F (37.2 C) (06/28 2023) Temp Source: Oral (06/28 2023) BP: 152/76 (06/28 2023) Pulse Rate: 107 (06/28 2023)  Labs: Recent Labs    03/08/19 1720 03/09/19 0522 03/10/19 0545 03/10/19 2236  HGB 11.1* 10.4* 10.1*  --   HCT 36.7 34.6* 34.0*  --   PLT 234 197 197  --   HEPARINUNFRC  --   --   --  0.54  CREATININE 2.53* 2.43* 1.78*  --     Estimated Creatinine Clearance: 41.5 mL/min (A) (by C-G formula based on SCr of 1.78 mg/dL (H)).   Medical History: Past Medical History:  Diagnosis Date  . Allergic rhinitis   . Depression   . Emphysema of lung ([place])    3L home O2  . GERD (gastroesophageal reflux disease)   . Hypertension   . Hypothyroidism   . Obesity, morbid, BMI 50 or higher ([place])   . Stroke [facility]) 2016   TIA   . Urine incontinence     Medications:  Medications Prior to Admission  Medication Sig Dispense Refill Last Dose  . albuterol (PROVENTIL HFA;VENTOLIN HFA) 108 (90 Base) MCG/ACT inhaler Inhale 1-2 puffs into the lungs every 6 (six) hours as needed for wheezing or shortness of breath.   unknown  . albuterol (PROVENTIL) (2.5 MG/3ML) 0.083% nebulizer solution Take 3 mLs (2.5 mg total) by nebulization every 6 (six) hours as needed for wheezing or shortness of breath. 150 mL 1   . busPIRone (BUSPAR) 15 MG tablet Take 15 mg by mouth 2 (two) times daily.   4 03/06/2019  . famotidine (PEPCID) 20 MG tablet Take 20 mg by mouth daily as needed for heartburn or indigestion.   03/07/2019 at Unknown time  . fluticasone (FLONASE) 50 MCG/ACT nasal spray Place 2 sprays into both nostrils daily as needed for allergies.   unknown  . glipiZIDE  (GLUCOTROL) 5 MG tablet Take 0.5 tablets (2.5 mg total) by mouth 2 (two) times daily before a meal. 30 tablet 11 03/06/2019  . insulin glargine (LANTUS) 100 UNIT/ML injection Inject 0.3 mLs (30 Units total) into the skin at bedtime. 10 mL 5 03/04/2019 at Unknown time  . levothyroxine (SYNTHROID) 50 MCG tablet Take 1 tablet (50 mcg total) by mouth daily. 90 tablet 0 03/06/2019  . LORazepam (ATIVAN) 1 MG tablet Take 0.5-1.5 mg by mouth See admin instructions. Take 0.12m by mouth in the morning and 1.568mby mouth at bedtime as needed for anxiety.   03/06/2019  . metFORMIN (GLUCOPHAGE) 500 MG tablet Take 2 tablets (1,000 mg total) by mouth 2 (two) times daily with a meal. 120 tablet 11 03/06/2019  . methocarbamol (ROBAXIN) 500 MG tablet Take 1 tablet (500 mg total) by mouth every 6 (six) hours as needed for muscle spasms. 10 tablet 0 Past Week at Unknown time  . metoprolol succinate (TOPROL-XL) 50 MG 24 hr tablet TAKE 2 TABLETS BY MOUTH ONCE DAILY IN THE MORNING WITH A MEAL OR IMMEDIATELY FOLLOWING A MEAL (Patient taking differently: Take 100 mg by mouth daily. ) 180 tablet 1 03/06/2019  . ondansetron (ZOFRAN) 4 MG tablet Take 1 tablet (4 mg total) by mouth every 8 (eight) hours  as needed for nausea or vomiting. 20 tablet 0 03/07/2019 at Unknown time  . potassium chloride SA (K-DUR) 20 MEQ tablet Take 1 tablet (20 mEq total) by mouth daily. 30 tablet 0 03/06/2019  . QUEtiapine (SEROQUEL) 100 MG tablet Take 100 mg by mouth at bedtime.   03/06/2019  . torsemide (DEMADEX) 20 MG tablet Take 1 tablet (20 mg total) by mouth daily. (Patient taking differently: Take 40 mg by mouth daily. Taking 2 tablets(39m) daily) 180 tablet 3 03/06/2019  . traMADol (ULTRAM) 50 MG tablet Take 50 mg by mouth every 6 (six) hours as needed for moderate pain.   03/07/2019 at Unknown time  . zolpidem (AMBIEN) 5 MG tablet Take 2 tablets (10 mg total) by mouth at bedtime.   03/07/2019 at Unknown time  . blood glucose meter kit and supplies KIT  Dispense based on patient and insurance preference. Use up to four times daily as directed. (FOR ICD-9 250.00, 250.01). 1 each 0   . Insulin Syringe-Needle U-100 28G X 5/16" 0.5 ML MISC Use with lantus once a day 100 each 1   . linaclotide (LINZESS) 145 MCG CAPS capsule Take 1 capsule (145 mcg total) by mouth daily before breakfast. (Patient not taking: Reported on 03/08/2019) 30 capsule 0 03/06/2019  . OXYGEN Inhale 3 L into the lungs continuous.       Scheduled:  . busPIRone  15 mg Oral BID  . insulin aspart  0-15 Units Subcutaneous Q4H  . levothyroxine  25 mcg Intravenous Daily  . LORazepam  0.5 mg Intravenous QPC lunch  . LORazepam  1 mg Intravenous QHS  . metoprolol succinate  100 mg Oral Daily  . QUEtiapine  100 mg Oral QHS  . [START ON 03/11/2019] rivaroxaban   Does not apply Once  . zolpidem  5 mg Oral QHS   PRN: acetaminophen **OR** acetaminophen, fluticasone, hydrALAZINE, ipratropium-albuterol, methocarbamol, ondansetron **OR** ondansetron (ZOFRAN) IV  Assessment: 70yoF with PMH HTN, CVA, GERD, COPD on O2, hypothyroid, admitted for SBO vs ileus. Found to have LLE DVT on day 2 of admission. Pharmacy initially requested to dose Xarelto, but d/t patient weight (136 kg, BMI 54), will start heparin instead likely  bridging to warfarin   Baseline INR, aPTT: not done  Prior anticoagulation: SQ heparin, LD 0600   Significant events:  Today, 03/10/2019:  CBC: hgb slightly low but stable, Plt stable WNL  No bleeding or infusion issues per nursing   2236 HL = 0.54 at goal, but heparin was not started until 1830 so HL is at 4 hours- so not at Css and only 4 hours post bolus- so unsure of accuracy. No bleeding or infusion issues per RN.   Goal of Therapy: Heparin level 0.3-0.7 units/ml Monitor platelets by anticoagulation protocol: Yes  Plan:  Continue heparin drip at 1500 units/hr  Recheck HL in am- to ensure stays in range as large bolus cleared and heparin reaches Css in pt  with elevated Scr  Daily CBC, daily heparin level once stable  Monitor for signs of bleeding or thrombosis   GDorrene German6/28/2020, 11:28 PM

## 2019-03-10 NOTE — Progress Notes (Signed)
Bilateral lower extremity venous duplex has been completed. Preliminary results can be found in CV Proc through chart review.  Results were given to the patient's nurse, Dekina.  03/10/19 2:05 PM Carlos Levering RVT

## 2019-03-11 ENCOUNTER — Inpatient Hospital Stay (HOSPITAL_COMMUNITY): Payer: Medicare HMO

## 2019-03-11 ENCOUNTER — Telehealth: Payer: Self-pay | Admitting: Family Medicine

## 2019-03-11 LAB — CBC WITH DIFFERENTIAL/PLATELET
Abs Immature Granulocytes: 0.14 10*3/uL — ABNORMAL HIGH (ref 0.00–0.07)
Basophils Absolute: 0 10*3/uL (ref 0.0–0.1)
Basophils Relative: 0 %
Eosinophils Absolute: 0.4 10*3/uL (ref 0.0–0.5)
Eosinophils Relative: 3 %
HCT: 32.6 % — ABNORMAL LOW (ref 36.0–46.0)
Hemoglobin: 9.7 g/dL — ABNORMAL LOW (ref 12.0–15.0)
Immature Granulocytes: 1 %
Lymphocytes Relative: 26 %
Lymphs Abs: 3.1 10*3/uL (ref 0.7–4.0)
MCH: 30.7 pg (ref 26.0–34.0)
MCHC: 29.8 g/dL — ABNORMAL LOW (ref 30.0–36.0)
MCV: 103.2 fL — ABNORMAL HIGH (ref 80.0–100.0)
Monocytes Absolute: 0.9 10*3/uL (ref 0.1–1.0)
Monocytes Relative: 8 %
Neutro Abs: 7.3 10*3/uL (ref 1.7–7.7)
Neutrophils Relative %: 62 %
Platelets: 192 10*3/uL (ref 150–400)
RBC: 3.16 MIL/uL — ABNORMAL LOW (ref 3.87–5.11)
RDW: 13.6 % (ref 11.5–15.5)
WBC: 11.8 10*3/uL — ABNORMAL HIGH (ref 4.0–10.5)
nRBC: 0.2 % (ref 0.0–0.2)

## 2019-03-11 LAB — GLUCOSE, CAPILLARY
Glucose-Capillary: 103 mg/dL — ABNORMAL HIGH (ref 70–99)
Glucose-Capillary: 108 mg/dL — ABNORMAL HIGH (ref 70–99)
Glucose-Capillary: 111 mg/dL — ABNORMAL HIGH (ref 70–99)
Glucose-Capillary: 112 mg/dL — ABNORMAL HIGH (ref 70–99)
Glucose-Capillary: 122 mg/dL — ABNORMAL HIGH (ref 70–99)
Glucose-Capillary: 93 mg/dL (ref 70–99)

## 2019-03-11 LAB — BASIC METABOLIC PANEL
Anion gap: 7 (ref 5–15)
BUN: 25 mg/dL — ABNORMAL HIGH (ref 8–23)
CO2: 26 mmol/L (ref 22–32)
Calcium: 8.2 mg/dL — ABNORMAL LOW (ref 8.9–10.3)
Chloride: 104 mmol/L (ref 98–111)
Creatinine, Ser: 1.63 mg/dL — ABNORMAL HIGH (ref 0.44–1.00)
GFR calc Af Amer: 38 mL/min — ABNORMAL LOW (ref >60)
GFR calc non Af Amer: 32 mL/min — ABNORMAL LOW (ref >60)
Glucose, Bld: 137 mg/dL — ABNORMAL HIGH (ref 70–99)
Potassium: 3.9 mmol/L (ref 3.5–5.1)
Sodium: 137 mmol/L (ref 135–145)

## 2019-03-11 LAB — PROTIME-INR
INR: 1.2 (ref 0.8–1.2)
Prothrombin Time: 14.7 seconds (ref 11.4–15.2)

## 2019-03-11 LAB — FOLATE RBC
Folate, Hemolysate: 267 ng/mL
Folate, RBC: 827 ng/mL (ref >498)
Hematocrit: 32.3 % — ABNORMAL LOW (ref 34.0–46.6)

## 2019-03-11 LAB — HEPARIN LEVEL (UNFRACTIONATED): Heparin Unfractionated: 0.46 IU/mL (ref 0.30–0.70)

## 2019-03-11 MED ORDER — TECHNETIUM TO 99M ALBUMIN AGGREGATED
1.6000 | Freq: Once | INTRAVENOUS | Status: AC | PRN
  Administered 2019-03-11: 14:00:00 1.6 via INTRAVENOUS

## 2019-03-11 MED ORDER — WARFARIN SODIUM 5 MG PO TABS
7.5000 mg | ORAL_TABLET | Freq: Once | ORAL | Status: AC
  Administered 2019-03-11: 18:00:00 7.5 mg via ORAL
  Filled 2019-03-11: qty 1

## 2019-03-11 MED ORDER — WARFARIN - PHARMACIST DOSING INPATIENT: Freq: Every day | Status: DC

## 2019-03-11 NOTE — Progress Notes (Signed)
[place] for: heparin, warfarin Indication: new DVT  Allergies  Allergen Reactions  . Norvasc [Amlodipine Besylate]     Marked swelling    Patient Measurements: Height: _0  (157.5 cm) Weight: 300 lb (136.1 kg) IBW/kg (Calculated) : 50.1  Heparin dosing weight: 84.7 kg  Vital Signs: Temp: 98.4 F (36.9 C) (06/29 0419) Temp Source: Oral (06/29 0419) BP: 130/76 (06/29 0419) Pulse Rate: 85 (06/29 0419)  Labs: Recent Labs    03/09/19 0522 03/10/19 0545 03/10/19 2236 03/11/19 0600 03/11/19 1045  HGB 10.4* 10.1*  --  9.7*  --   HCT 34.6* 34.0*  --  32.6*  --   PLT 197 197  --  192  --   LABPROT  --   --   --   --  14.7  INR  --   --   --   --  1.2  HEPARINUNFRC  --   --  0.54 0.46  --   CREATININE 2.43* 1.78*  --  1.63*  --     Estimated Creatinine Clearance: 45.3 mL/min (A) (by C-G formula based on SCr of 1.63 mg/dL (H)).   Medical History: Past Medical History:  Diagnosis Date  . Allergic rhinitis   . Depression   . Emphysema of lung ([place])    3L home O2  . GERD (gastroesophageal reflux disease)   . Hypertension   . Hypothyroidism   . Obesity, morbid, BMI 50 or higher ([place])   . Stroke [facility]) 2016   TIA   . Urine incontinence     Medications:  Medications Prior to Admission  Medication Sig Dispense Refill Last Dose  . albuterol (PROVENTIL HFA;VENTOLIN HFA) 108 (90 Base) MCG/ACT inhaler Inhale 1-2 puffs into the lungs every 6 (six) hours as needed for wheezing or shortness of breath.   unknown  . albuterol (PROVENTIL) (2.5 MG/3ML) 0.083% nebulizer solution Take 3 mLs (2.5 mg total) by nebulization every 6 (six) hours as needed for wheezing or shortness of breath. 150 mL 1   . busPIRone (BUSPAR) 15 MG tablet Take 15 mg by mouth 2 (two) times daily.   4 03/06/2019  . famotidine (PEPCID) 20 MG tablet Take 20 mg by mouth daily as needed for heartburn or indigestion.   03/07/2019 at Unknown time  . fluticasone (FLONASE) 50  MCG/ACT nasal spray Place 2 sprays into both nostrils daily as needed for allergies.   unknown  . glipiZIDE (GLUCOTROL) 5 MG tablet Take 0.5 tablets (2.5 mg total) by mouth 2 (two) times daily before a meal. 30 tablet 11 03/06/2019  . insulin glargine (LANTUS) 100 UNIT/ML injection Inject 0.3 mLs (30 Units total) into the skin at bedtime. 10 mL 5 03/04/2019 at Unknown time  . levothyroxine (SYNTHROID) 50 MCG tablet Take 1 tablet (50 mcg total) by mouth daily. 90 tablet 0 03/06/2019  . LORazepam (ATIVAN) 1 MG tablet Take 0.5-1.5 mg by mouth See admin instructions. Take 0.2m by mouth in the morning and 1.579mby mouth at bedtime as needed for anxiety.   03/06/2019  . metFORMIN (GLUCOPHAGE) 500 MG tablet Take 2 tablets (1,000 mg total) by mouth 2 (two) times daily with a meal. 120 tablet 11 03/06/2019  . methocarbamol (ROBAXIN) 500 MG tablet Take 1 tablet (500 mg total) by mouth every 6 (six) hours as needed for muscle spasms. 10 tablet 0 Past Week at Unknown time  . metoprolol succinate (TOPROL-XL) 50 MG 24 hr tablet TAKE 2 TABLETS BY MOUTH ONCE  DAILY IN THE MORNING WITH A MEAL OR IMMEDIATELY FOLLOWING A MEAL (Patient taking differently: Take 100 mg by mouth daily. ) 180 tablet 1 03/06/2019  . ondansetron (ZOFRAN) 4 MG tablet Take 1 tablet (4 mg total) by mouth every 8 (eight) hours as needed for nausea or vomiting. 20 tablet 0 03/07/2019 at Unknown time  . potassium chloride SA (K-DUR) 20 MEQ tablet Take 1 tablet (20 mEq total) by mouth daily. 30 tablet 0 03/06/2019  . QUEtiapine (SEROQUEL) 100 MG tablet Take 100 mg by mouth at bedtime.   03/06/2019  . torsemide (DEMADEX) 20 MG tablet Take 1 tablet (20 mg total) by mouth daily. (Patient taking differently: Take 40 mg by mouth daily. Taking 2 tablets(33m) daily) 180 tablet 3 03/06/2019  . traMADol (ULTRAM) 50 MG tablet Take 50 mg by mouth every 6 (six) hours as needed for moderate pain.   03/07/2019 at Unknown time  . zolpidem (AMBIEN) 5 MG tablet Take 2 tablets  (10 mg total) by mouth at bedtime.   03/07/2019 at Unknown time  . blood glucose meter kit and supplies KIT Dispense based on patient and insurance preference. Use up to four times daily as directed. (FOR ICD-9 250.00, 250.01). 1 each 0   . Insulin Syringe-Needle U-100 28G X 5/16" 0.5 ML MISC Use with lantus once a day 100 each 1   . linaclotide (LINZESS) 145 MCG CAPS capsule Take 1 capsule (145 mcg total) by mouth daily before breakfast. (Patient not taking: Reported on 03/08/2019) 30 capsule 0 03/06/2019  . OXYGEN Inhale 3 L into the lungs continuous.       Scheduled:  . busPIRone  15 mg Oral BID  . insulin aspart  0-15 Units Subcutaneous Q4H  . levothyroxine  25 mcg Intravenous Daily  . LORazepam  0.5 mg Intravenous QPC lunch  . LORazepam  1 mg Intravenous QHS  . metoprolol succinate  100 mg Oral Daily  . QUEtiapine  100 mg Oral QHS  . zolpidem  5 mg Oral QHS   PRN: acetaminophen **OR** acetaminophen, fluticasone, hydrALAZINE, ipratropium-albuterol, methocarbamol, ondansetron **OR** ondansetron (ZOFRAN) IV  Assessment: 89yoF with PMH HTN, CVA, GERD, COPD on O2, hypothyroid, admitted for SBO vs ileus. Found to have LLE DVT on day 2 of admission. Pharmacy was initially requested to dose Xarelto, but d/t patient weight (136 kg, BMI 54), after discussion with MD orders were received to instead begin IV heparin with bridging to warfarin.  Today, 03/11/2019:  Beginning D#2 IV heparin  Heparin level therapeutic on infusion at 1500 units/hr  CBC: Hgb slightly low but stable. Pltc stable, WNL  MD orders received to begin warfarin with pharmacy dosing assistance  Baseline INR wnl  Soft diet  No significant drug interactions with warfarin identified  Goal of Therapy: Heparin level 0.3-0.7 units/ml INR 2-3 Monitor platelets by anticoagulation protocol: Yes  Plan:  Continue heparin infusion at 1500 units/hr  Warfarin 7.5 mg PO x 1 today at 1800  Daily heparin level, INR,  CBC  Monitor for reports of bleeding  Warfarin patient education  RClayburn Pert PharmD, BCPS [id number]/29/2020  12:49 PM

## 2019-03-11 NOTE — Telephone Encounter (Signed)
FYI

## 2019-03-11 NOTE — Care Management Important Message (Signed)
Important Message  Patient Details IM Letter given to Kathrin Greathouse  SW to present to the Patient Name: SHERRYANN FRESE MRN: [id number] Date of Birth: [birth year]   Medicare Important Message Given:  Yes     Kerin Salen 03/11/2019, 11:54 AM
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
## 2019-03-11 NOTE — Telephone Encounter (Signed)
Patient's daughter, [name], calling to inform Dr Quay Burow that the patient has been readmitted to the hospital for a blockage and a blood clot.
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
## 2019-03-12 DIAGNOSIS — E118 Type 2 diabetes mellitus with unspecified complications: Secondary | ICD-10-CM

## 2019-03-12 DIAGNOSIS — I82492 Acute embolism and thrombosis of other specified deep vein of left lower extremity: Secondary | ICD-10-CM

## 2019-03-12 LAB — CBC WITH DIFFERENTIAL/PLATELET
Abs Immature Granulocytes: 0.19 10*3/uL — ABNORMAL HIGH (ref 0.00–0.07)
Basophils Absolute: 0 10*3/uL (ref 0.0–0.1)
Basophils Relative: 0 %
Eosinophils Absolute: 0.4 10*3/uL (ref 0.0–0.5)
Eosinophils Relative: 4 %
HCT: 31.5 % — ABNORMAL LOW (ref 36.0–46.0)
Hemoglobin: 9.8 g/dL — ABNORMAL LOW (ref 12.0–15.0)
Immature Granulocytes: 2 %
Lymphocytes Relative: 33 %
Lymphs Abs: 3.2 10*3/uL (ref 0.7–4.0)
MCH: 31.6 pg (ref 26.0–34.0)
MCHC: 31.1 g/dL (ref 30.0–36.0)
MCV: 101.6 fL — ABNORMAL HIGH (ref 80.0–100.0)
Monocytes Absolute: 0.7 10*3/uL (ref 0.1–1.0)
Monocytes Relative: 8 %
Neutro Abs: 5.3 10*3/uL (ref 1.7–7.7)
Neutrophils Relative %: 53 %
Platelets: 199 10*3/uL (ref 150–400)
RBC: 3.1 MIL/uL — ABNORMAL LOW (ref 3.87–5.11)
RDW: 13.8 % (ref 11.5–15.5)
WBC: 9.8 10*3/uL (ref 4.0–10.5)
nRBC: 0.4 % — ABNORMAL HIGH (ref 0.0–0.2)

## 2019-03-12 LAB — PROTIME-INR
INR: 1.1 (ref 0.8–1.2)
Prothrombin Time: 14.3 seconds (ref 11.4–15.2)

## 2019-03-12 LAB — GLUCOSE, CAPILLARY
Glucose-Capillary: 100 mg/dL — ABNORMAL HIGH (ref 70–99)
Glucose-Capillary: 113 mg/dL — ABNORMAL HIGH (ref 70–99)
Glucose-Capillary: 113 mg/dL — ABNORMAL HIGH (ref 70–99)
Glucose-Capillary: 126 mg/dL — ABNORMAL HIGH (ref 70–99)

## 2019-03-12 LAB — HEPARIN LEVEL (UNFRACTIONATED): Heparin Unfractionated: 0.26 IU/mL — ABNORMAL LOW (ref 0.30–0.70)

## 2019-03-12 MED ORDER — ENOXAPARIN SODIUM 150 MG/ML ~~LOC~~ SOLN: 140.0000 mg | Freq: Two times a day (BID) | SUBCUTANEOUS | 0 refills | Status: DC

## 2019-03-12 MED ORDER — HEPARIN BOLUS VIA INFUSION
2500.0000 [IU] | Freq: Once | INTRAVENOUS | Status: AC
  Administered 2019-03-12: 2500 [IU] via INTRAVENOUS
  Filled 2019-03-12: qty 2500

## 2019-03-12 MED ORDER — WARFARIN SODIUM 5 MG PO TABS: 7.5000 mg | ORAL_TABLET | Freq: Once | ORAL | Status: DC

## 2019-03-12 MED ORDER — ENOXAPARIN SODIUM 150 MG/ML ~~LOC~~ SOLN
140.0000 mg | Freq: Once | SUBCUTANEOUS | Status: AC
  Administered 2019-03-12: 140 mg via SUBCUTANEOUS
  Filled 2019-03-12: qty 0.93

## 2019-03-12 MED ORDER — WARFARIN SODIUM 5 MG PO TABS: 7.5000 mg | ORAL_TABLET | Freq: Every day | ORAL | 0 refills | Status: DC

## 2019-03-12 NOTE — Progress Notes (Signed)
[place] for: enoxaparin, warfarin Indication: new DVT  Allergies  Allergen Reactions  . Norvasc [Amlodipine Besylate]     Marked swelling    Patient Measurements: Height: _0  (157.5 cm) Weight: 300 lb (136.1 kg) IBW/kg (Calculated) : 50.1  Heparin dosing weight: 84.7 kg  Vital Signs: Temp: 97.9 F (36.6 C) (06/30 0631) Temp Source: Oral (06/30 0631) BP: 136/85 (06/30 0631) Pulse Rate: 80 (06/30 0631)  Labs: Recent Labs    03/10/19 0545 03/10/19 2236 03/11/19 0600 03/11/19 1045 03/12/19 0542  HGB 10.1*  --  9.7*  --  9.8*  HCT 34.0*  32.3*  --  32.6*  --  31.5*  PLT 197  --  192  --  199  LABPROT  --   --   --  14.7 14.3  INR  --   --   --  1.2 1.1  HEPARINUNFRC  --  0.54 0.46  --  0.26*  CREATININE 1.78*  --  1.63*  --   --     Estimated Creatinine Clearance: 45.3 mL/min (A) (by C-G formula based on SCr of 1.63 mg/dL (H)).   Medical History: Past Medical History:  Diagnosis Date  . Allergic rhinitis   . Depression   . Emphysema of lung ([place])    3L home O2  . GERD (gastroesophageal reflux disease)   . Hypertension   . Hypothyroidism   . Obesity, morbid, BMI 50 or higher ([place])   . Stroke [facility]) 2016   TIA   . Urine incontinence     Medications:  Medications Prior to Admission  Medication Sig Dispense Refill Last Dose  . albuterol (PROVENTIL HFA;VENTOLIN HFA) 108 (90 Base) MCG/ACT inhaler Inhale 1-2 puffs into the lungs every 6 (six) hours as needed for wheezing or shortness of breath.   unknown  . albuterol (PROVENTIL) (2.5 MG/3ML) 0.083% nebulizer solution Take 3 mLs (2.5 mg total) by nebulization every 6 (six) hours as needed for wheezing or shortness of breath. 150 mL 1   . busPIRone (BUSPAR) 15 MG tablet Take 15 mg by mouth 2 (two) times daily.   4 03/06/2019  . famotidine (PEPCID) 20 MG tablet Take 20 mg by mouth daily as needed for heartburn or indigestion.   03/07/2019 at Unknown time  . fluticasone  (FLONASE) 50 MCG/ACT nasal spray Place 2 sprays into both nostrils daily as needed for allergies.   unknown  . glipiZIDE (GLUCOTROL) 5 MG tablet Take 0.5 tablets (2.5 mg total) by mouth 2 (two) times daily before a meal. 30 tablet 11 03/06/2019  . insulin glargine (LANTUS) 100 UNIT/ML injection Inject 0.3 mLs (30 Units total) into the skin at bedtime. 10 mL 5 03/04/2019 at Unknown time  . levothyroxine (SYNTHROID) 50 MCG tablet Take 1 tablet (50 mcg total) by mouth daily. 90 tablet 0 03/06/2019  . LORazepam (ATIVAN) 1 MG tablet Take 0.5-1.5 mg by mouth See admin instructions. Take 0.53m by mouth in the morning and 1.558mby mouth at bedtime as needed for anxiety.   03/06/2019  . metFORMIN (GLUCOPHAGE) 500 MG tablet Take 2 tablets (1,000 mg total) by mouth 2 (two) times daily with a meal. 120 tablet 11 03/06/2019  . methocarbamol (ROBAXIN) 500 MG tablet Take 1 tablet (500 mg total) by mouth every 6 (six) hours as needed for muscle spasms. 10 tablet 0 Past Week at Unknown time  . metoprolol succinate (TOPROL-XL) 50 MG 24 hr tablet TAKE 2 TABLETS BY MOUTH ONCE DAILY IN  THE MORNING WITH A MEAL OR IMMEDIATELY FOLLOWING A MEAL (Patient taking differently: Take 100 mg by mouth daily. ) 180 tablet 1 03/06/2019  . ondansetron (ZOFRAN) 4 MG tablet Take 1 tablet (4 mg total) by mouth every 8 (eight) hours as needed for nausea or vomiting. 20 tablet 0 03/07/2019 at Unknown time  . potassium chloride SA (K-DUR) 20 MEQ tablet Take 1 tablet (20 mEq total) by mouth daily. 30 tablet 0 03/06/2019  . QUEtiapine (SEROQUEL) 100 MG tablet Take 100 mg by mouth at bedtime.   03/06/2019  . torsemide (DEMADEX) 20 MG tablet Take 1 tablet (20 mg total) by mouth daily. (Patient taking differently: Take 40 mg by mouth daily. Taking 2 tablets(59m) daily) 180 tablet 3 03/06/2019  . traMADol (ULTRAM) 50 MG tablet Take 50 mg by mouth every 6 (six) hours as needed for moderate pain.   03/07/2019 at Unknown time  . zolpidem (AMBIEN) 5 MG tablet Take  2 tablets (10 mg total) by mouth at bedtime.   03/07/2019 at Unknown time  . blood glucose meter kit and supplies KIT Dispense based on patient and insurance preference. Use up to four times daily as directed. (FOR ICD-9 250.00, 250.01). 1 each 0   . Insulin Syringe-Needle U-100 28G X 5/16" 0.5 ML MISC Use with lantus once a day 100 each 1   . linaclotide (LINZESS) 145 MCG CAPS capsule Take 1 capsule (145 mcg total) by mouth daily before breakfast. (Patient not taking: Reported on 03/08/2019) 30 capsule 0 03/06/2019  . OXYGEN Inhale 3 L into the lungs continuous.       Scheduled:  . busPIRone  15 mg Oral BID  . enoxaparin (LOVENOX) injection  140 mg Subcutaneous Once  . insulin aspart  0-15 Units Subcutaneous Q4H  . levothyroxine  25 mcg Intravenous Daily  . LORazepam  0.5 mg Intravenous QPC lunch  . LORazepam  1 mg Intravenous QHS  . metoprolol succinate  100 mg Oral Daily  . QUEtiapine  100 mg Oral QHS  . warfarin  7.5 mg Oral ONCE-1800  . Warfarin - Pharmacist Dosing Inpatient   Does not apply q1800  . zolpidem  5 mg Oral QHS   PRN: acetaminophen **OR** acetaminophen, fluticasone, hydrALAZINE, ipratropium-albuterol, methocarbamol, ondansetron **OR** ondansetron (ZOFRAN) IV  Assessment: 89yoF with PMH HTN, CVA, GERD, COPD on O2, hypothyroid, admitted for SBO vs ileus. Found to have LLE DVT on day 2 of admission. Pharmacy was initially requested to dose Xarelto, but d/t patient weight (136 kg, BMI 54), after discussion with MD, orders were received to instead begin IV heparin with bridging to warfarin.  Today, 03/12/2019: UPDATE to note from earlier this AM Consult received from attending MD to transition patient from IV heparin to SQ enoxaparin in anticipation of discharge today with bridging to warfarin.  Have placed orders to:  Discontinue IV heparin infusion  1 hour after infusion is stopped, administer enoxaparin 140 mg (~1 mg/kg) SQ once  Reviewed above with RN  Discharge  anticoagulation dosage recommendations:  Enoxaparin 140 mg (~ 1 mg/kg) SQ q12h  Recommend continuing for at least 5 days AND until INR has been 2.0 or above on at least two consecutive readings on warfarin.  Warfarin 7.5 mg PO daily at 1800   Recommend using 5 mg, 1-1/2 tablets daily to allow easier titration of dosage as outpatient  Close INR monitoring during initiation phase of warfarin  Recommend outpatient INR twice a week until therapeutic and stable, then ongoing monitoring  per outpatient clinic's standard. . Met with patient and reviewed enoxaparin and warfarin teaching points.  Detailed handout for warfarin provided.  Patient states she has experience with SQ injections (insulin) and is willing to self-inject today's initial enoxaparin dose with RN supervision prior to discharge.  Clayburn Pert, PharmD, BCPS [phone] 03/12/2019  11:29 AM

## 2019-03-12 NOTE — Discharge Instructions (Signed)
Information on my medicine - Coumadin (Warfarin)    WHY WAS COUMADIN PRESCRIBED FOR YOU?  Coumadin was prescribed for you because you have a blood clot or a medical condition that can cause an increased risk of forming blood clots. Blood clots can cause serious health problems by blocking the flow of blood to the heart, lung, or brain. Coumadin can prevent harmful blood clots from forming.  As a reminder, your indication for Coumadin is:    Blood clot in leg    WHAT TEST IS REQUIRED TO CHECK ON MY RESPONSE TO COUMADIN?  While on Coumadin (warfarin) you will need to have an INR test regularly to ensure that your dose is keeping you in the desired range. The INR (international normalized ratio) number is calculated from the result of the laboratory test called prothrombin time (PT).    If an INR APPOINTMENT HAS NOT ALREADY BEEN MADE FOR YOU please schedule an appointment to have this lab work done by your health care provider within 7 days.  Your INR goal is usually a number between: 2 to 3 or your provider may give you a more narrow range like 2-2.5. Ask your health care provider during an office visit what your goal INR is.    WHAT DO YOU NEED TO KNOW ABOUT COUMADIN?  Take Coumadin (warfarin) exactly as prescribed by your healthcare provider about the same time each day. DO NOT stop taking without talking to the doctor who prescribed the medication. Stopping without other blood clot prevention medication to take the place of Coumadin may increase your risk of developing a new clot or stroke. Get refills before you run out.    WHAT DO YOU DO IF YOU MISS A DOSE?  If you miss a dose, take it as soon as you remember on the same day then continue your regularly scheduled regimen the next day. Do not take two doses of Coumadin at the same time.    IMPORTANT SAFETY INFORMATION  A possible side effect of Coumadin (Warfarin) is an increased risk of bleeding. You should call your healthcare provider  right away if you experience any of the following:  ? Bleeding from an injury or your nose that does not stop.  ? Unusual colored urine (red or dark brown) or unusual colored stools (red or black).  Unusual bruising for unknown reasons.  ? A serious fall or if you hit your head (even if there is no bleeding).    Some foods or medicines interact with Coumadin (warfarin) and might alter your response to warfarin. To help avoid this:  ? Eat a balanced diet, maintaining a consistent amount of Vitamin K.  ? Notify your provider about major diet changes you plan to make.  ? Avoid alcohol or limit your intake to 1 drink for women and 2 drinks for men per day. (1 drink is 5 oz. wine, 12 oz. beer, or 1.5 oz. liquor.)    Make sure that ANY health care provider who prescribes medication for you knows that you are taking Coumadin (warfarin). Also make sure the healthcare provider who is monitoring your Coumadin knows when you have started a new medication including herbals and non-prescription products.     COUMADIN (WARFARIN) MAJOR DRUG INTERACTIONS   Increased Warfarin Effect  Decreased Warfarin Effect   Alcohol (large quantities)  Antibiotics (esp. Septra/Bactrim, Flagyl, Cipro)  Amiodarone (Cordarone)  Aspirin (ASA)  Cimetidine (Tagamet)  Megestrol (Megace)  NSAIDs (ibuprofen, naproxen, etc.)  Piroxicam (Feldene)  Propafenone (  Rythmol SR)  Propranolol (Inderal)  Isoniazid (INH)  Posaconazole (Noxafil)  Barbiturates (Phenobarbital)  Carbamazepine (Tegretol)  Chlordiazepoxide (Librium)  Cholestyramine (Questran)  Griseofulvin  Oral Contraceptives  Rifampin  Sucralfate (Carafate)  Vitamin K     COUMADIN (WARFARIN) MAJOR HERBAL INTERACTIONS   Increased Warfarin Effect  Decreased Warfarin Effect   Garlic  Ginseng  Ginkgo biloba  Coenzyme Q10  Green tea  St. Johns wort      COUMADIN (WARFARIN) FOOD INTERACTIONS   Foods high in Vitamin K (1 serving =  cup)  Eat a consistent  number of servings each week  Collards (cooked, or boiled & drained)  Kale (cooked, or boiled & drained)  Mustard greens (cooked, or boiled & drained)  Parsley *serving size only =  cup  Spinach (cooked, or boiled & drained)  Swiss chard (cooked, or boiled & drained)  Turnip greens (cooked, or boiled & drained)   Foods medium-high in Vitamin K (1 serving = 1 cup)  Eat a consistent number of servings each week  Asparagus (cooked, or boiled & drained)  Broccoli (cooked, boiled & drained, or raw & chopped)  Brussel sprouts (cooked, or boiled & drained) *serving size only =  cup Lettuce, raw (green leaf, endive, romaine)  Spinach, raw  Turnip greens, raw & chopped     These websites have more information on Coumadin (warfarin): FailFactory.se; VeganReport.com.au;    =========================================  Enoxaparin injection  What is this medicine? ENOXAPARIN (ee nox a PA rin) is used after knee, hip, or abdominal surgeries to prevent blood clotting. It is also used to treat existing blood clots in the lungs or in the veins. This medicine may be used for other purposes; ask your health care provider or pharmacist if you have questions. COMMON BRAND NAME(S): Lovenox What should I tell my health care provider before I take this medicine? They need to know if you have any of these conditions:  bleeding disorders, hemorrhage, or hemophilia  infection of the heart or heart valves  kidney or liver disease  previous stroke  prosthetic heart valve  recent surgery or delivery of a baby  ulcer in the stomach or intestine, diverticulitis, or other bowel disease  an unusual or allergic reaction to enoxaparin, heparin, pork or pork products, other medicines, foods, dyes, or preservatives  pregnant or trying to get pregnant  breast-feeding How should I use this medicine? This medicine is for injection under the skin. It is usually given by a health-care  professional. You or a family member may be trained on how to give the injections. If you are to give yourself injections, make sure you understand how to use the syringe, measure the dose if necessary, and give the injection. To avoid bruising, do not rub the site where this medicine has been injected. Do not take your medicine more often than directed. Do not stop taking except on the advice of your doctor or health care professional. Make sure you receive a puncture-resistant container to dispose of the needles and syringes once you have finished with them. Do not reuse these items. Return the container to your doctor or health care professional for proper disposal. Talk to your pediatrician regarding the use of this medicine in children. Special care may be needed. Overdosage: If you think you have taken too much of this medicine contact a poison control center or emergency room at once. NOTE: This medicine is only for you. Do not share this medicine with others. What if I miss a  dose? If you miss a dose, take it as soon as you can. If it is almost time for your next dose, take only that dose. Do not take double or extra doses. What may interact with this medicine?  aspirin and aspirin-like medicines  certain medicines that treat or prevent blood clots  dipyridamole  NSAIDs, medicines for pain and inflammation, like ibuprofen or naproxen This list may not describe all possible interactions. Give your health care provider a list of all the medicines, herbs, non-prescription drugs, or dietary supplements you use. Also tell them if you smoke, drink alcohol, or use illegal drugs. Some items may interact with your medicine. What should I watch for while using this medicine? Visit your healthcare professional for regular checks on your progress. You may need blood work done while you are taking this medicine. Your condition will be monitored carefully while you are receiving this medicine. It is  important not to miss any appointments. If you are going to need surgery or other procedure, tell your healthcare professional that you are using this medicine. Using this medicine for a long time may weaken your bones and increase the risk of bone fractures. Avoid sports and activities that might cause injury while you are using this medicine. Severe falls or injuries can cause unseen bleeding. Be careful when using sharp tools or knives. Consider using an Copy. Take special care brushing or flossing your teeth. Report any injuries, bruising, or red spots on the skin to your healthcare professional. Wear a medical ID bracelet or chain. Carry a card that describes your disease and details of your medicine and dosage times. What side effects may I notice from receiving this medicine? Side effects that you should report to your doctor or health care professional as soon as possible:  allergic reactions like skin rash, itching or hives, swelling of the face, lips, or tongue  bone pain  signs and symptoms of bleeding such as bloody or black, tarry stools; red or dark-brown urine; spitting up blood or brown material that looks like coffee grounds; red spots on the skin; unusual bruising or bleeding from the eye, gums, or nose  signs and symptoms of a blood clot such as chest pain; shortness of breath; pain, swelling, or warmth in the leg  signs and symptoms of a stroke such as changes in vision; confusion; trouble speaking or understanding; severe headaches; sudden numbness or weakness of the face, arm or leg; trouble walking; dizziness; loss of coordination Side effects that usually do not require medical attention (report to your doctor or health care professional if they continue or are bothersome):  hair loss  pain, redness, or irritation at site where injected This list may not describe all possible side effects. Call your doctor for medical advice about side effects. You may report  side effects to FDA at 1-800-FDA-1088. Where should I keep my medicine? Keep out of the reach of children. Store at room temperature between 15 and 30 degrees C (59 and 86 degrees F). Do not freeze. If your injections have been specially prepared, you may need to store them in the refrigerator. Ask your pharmacist. Throw away any unused medicine after the expiration date. NOTE: This sheet is a summary. It may not cover all possible information. If you have questions about this medicine, talk to your doctor, pharmacist, or health care provider.  2020 Elsevier/Gold Standard (2017-08-24 11:25:34)

## 2019-03-12 NOTE — Progress Notes (Signed)
[place] for: heparin, warfarin Indication: new DVT  Allergies  Allergen Reactions  . Norvasc [Amlodipine Besylate]     Marked swelling    Patient Measurements: Height: '5\' 2"'  (157.5 cm) Weight: 300 lb (136.1 kg) IBW/kg (Calculated) : 50.1  Heparin dosing weight: 84.7 kg  Vital Signs: Temp: 97.9 F (36.6 C) (06/30 0631) Temp Source: Oral (06/30 0631) BP: 136/85 (06/30 0631) Pulse Rate: 80 (06/30 0631)  Labs: Recent Labs    03/10/19 0545 03/10/19 2236 03/11/19 0600 03/11/19 1045 03/12/19 0542  HGB 10.1*  --  9.7*  --  9.8*  HCT 34.0*  32.3*  --  32.6*  --  31.5*  PLT 197  --  192  --  199  LABPROT  --   --   --  14.7 14.3  INR  --   --   --  1.2 1.1  HEPARINUNFRC  --  0.54 0.46  --  0.26*  CREATININE 1.78*  --  1.63*  --   --     Estimated Creatinine Clearance: 45.3 mL/min (A) (by C-G formula based on SCr of 1.63 mg/dL (H)).   Medical History: Past Medical History:  Diagnosis Date  . Allergic rhinitis   . Depression   . Emphysema of lung ([place])    3L home O2  . GERD (gastroesophageal reflux disease)   . Hypertension   . Hypothyroidism   . Obesity, morbid, BMI 50 or higher ([place])   . Stroke [facility]) 2016   TIA   . Urine incontinence     Medications:  Medications Prior to Admission  Medication Sig Dispense Refill Last Dose  . albuterol (PROVENTIL HFA;VENTOLIN HFA) 108 (90 Base) MCG/ACT inhaler Inhale 1-2 puffs into the lungs every 6 (six) hours as needed for wheezing or shortness of breath.   unknown  . albuterol (PROVENTIL) (2.5 MG/3ML) 0.083% nebulizer solution Take 3 mLs (2.5 mg total) by nebulization every 6 (six) hours as needed for wheezing or shortness of breath. 150 mL 1   . busPIRone (BUSPAR) 15 MG tablet Take 15 mg by mouth 2 (two) times daily.   4 03/06/2019  . famotidine (PEPCID) 20 MG tablet Take 20 mg by mouth daily as needed for heartburn or indigestion.   03/07/2019 at Unknown time  . fluticasone (FLONASE)  50 MCG/ACT nasal spray Place 2 sprays into both nostrils daily as needed for allergies.   unknown  . glipiZIDE (GLUCOTROL) 5 MG tablet Take 0.5 tablets (2.5 mg total) by mouth 2 (two) times daily before a meal. 30 tablet 11 03/06/2019  . insulin glargine (LANTUS) 100 UNIT/ML injection Inject 0.3 mLs (30 Units total) into the skin at bedtime. 10 mL 5 03/04/2019 at Unknown time  . levothyroxine (SYNTHROID) 50 MCG tablet Take 1 tablet (50 mcg total) by mouth daily. 90 tablet 0 03/06/2019  . LORazepam (ATIVAN) 1 MG tablet Take 0.5-1.5 mg by mouth See admin instructions. Take 0.46m by mouth in the morning and 1.533mby mouth at bedtime as needed for anxiety.   03/06/2019  . metFORMIN (GLUCOPHAGE) 500 MG tablet Take 2 tablets (1,000 mg total) by mouth 2 (two) times daily with a meal. 120 tablet 11 03/06/2019  . methocarbamol (ROBAXIN) 500 MG tablet Take 1 tablet (500 mg total) by mouth every 6 (six) hours as needed for muscle spasms. 10 tablet 0 Past Week at Unknown time  . metoprolol succinate (TOPROL-XL) 50 MG 24 hr tablet TAKE 2 TABLETS BY MOUTH ONCE DAILY IN  THE MORNING WITH A MEAL OR IMMEDIATELY FOLLOWING A MEAL (Patient taking differently: Take 100 mg by mouth daily. ) 180 tablet 1 03/06/2019  . ondansetron (ZOFRAN) 4 MG tablet Take 1 tablet (4 mg total) by mouth every 8 (eight) hours as needed for nausea or vomiting. 20 tablet 0 03/07/2019 at Unknown time  . potassium chloride SA (K-DUR) 20 MEQ tablet Take 1 tablet (20 mEq total) by mouth daily. 30 tablet 0 03/06/2019  . QUEtiapine (SEROQUEL) 100 MG tablet Take 100 mg by mouth at bedtime.   03/06/2019  . torsemide (DEMADEX) 20 MG tablet Take 1 tablet (20 mg total) by mouth daily. (Patient taking differently: Take 40 mg by mouth daily. Taking 2 tablets(34m) daily) 180 tablet 3 03/06/2019  . traMADol (ULTRAM) 50 MG tablet Take 50 mg by mouth every 6 (six) hours as needed for moderate pain.   03/07/2019 at Unknown time  . zolpidem (AMBIEN) 5 MG tablet Take 2 tablets  (10 mg total) by mouth at bedtime.   03/07/2019 at Unknown time  . blood glucose meter kit and supplies KIT Dispense based on patient and insurance preference. Use up to four times daily as directed. (FOR ICD-9 250.00, 250.01). 1 each 0   . Insulin Syringe-Needle U-100 28G X 5/16" 0.5 ML MISC Use with lantus once a day 100 each 1   . linaclotide (LINZESS) 145 MCG CAPS capsule Take 1 capsule (145 mcg total) by mouth daily before breakfast. (Patient not taking: Reported on 03/08/2019) 30 capsule 0 03/06/2019  . OXYGEN Inhale 3 L into the lungs continuous.       Scheduled:  . busPIRone  15 mg Oral BID  . heparin  2,500 Units Intravenous Once  . insulin aspart  0-15 Units Subcutaneous Q4H  . levothyroxine  25 mcg Intravenous Daily  . LORazepam  0.5 mg Intravenous QPC lunch  . LORazepam  1 mg Intravenous QHS  . metoprolol succinate  100 mg Oral Daily  . QUEtiapine  100 mg Oral QHS  . warfarin  7.5 mg Oral ONCE-1800  . Warfarin - Pharmacist Dosing Inpatient   Does not apply q1800  . zolpidem  5 mg Oral QHS   PRN: acetaminophen **OR** acetaminophen, fluticasone, hydrALAZINE, ipratropium-albuterol, methocarbamol, ondansetron **OR** ondansetron (ZOFRAN) IV  Assessment: 89yoF with PMH HTN, CVA, GERD, COPD on O2, hypothyroid, admitted for SBO vs ileus. Found to have LLE DVT on day 2 of admission. Pharmacy was initially requested to dose Xarelto, but d/t patient weight (136 kg, BMI 54), after discussion with MD orders were received to instead begin IV heparin with bridging to warfarin.  Today, 03/12/2019:  Beginning D#3 IV heparin  Heparin level subtherapeutic on infusion at 1500 units/hr.  RN reports no interruptions in infusion  INR unchanged as expected after initial warfarin dose 7.5 mg yesterday.  CBC: Hgb slightly low but stable. Pltc stable, WNL  No bleeding reported.  MD orders received to begin warfarin with pharmacy dosing assistance  Soft diet  No significant drug interactions  with warfarin identified  Goal of Therapy: Heparin level 0.3-0.7 units/ml INR 2-3 Monitor platelets by anticoagulation protocol: Yes  Plan:  Heparin 2500 units IV bolus x 1, increase infusion to 1750 units/hr.  Repeat heparin level today at 1300  Warfarin 7.5 mg PO x 1 today at 1800  Daily heparin level, INR, CBC  Monitor for reports of bleeding  Warfarin patient education  RClayburn Pert PharmD, BCPS [phone]/30/2020  6:42 AM

## 2019-03-12 NOTE — Progress Notes (Signed)
Sue [name] was given discharge instructions with no immediate questions or concerns. Patient will be taken downstairs via wheelchair for discharge.

## 2019-03-12 NOTE — Discharge Summary (Addendum)
Discharge Summary  Sue [name] RZN:[id number] DOB: Sep 29, 1950  PCP: Sue Held, DO  Admit date: 03/08/2019 Discharge date: 03/12/2019  Time spent: 40 mins  Recommendations for Outpatient Follow-up:  1. PCP to follow-up with repeat labs including INR checks on 03/13/2019 and 03/15/2019 2. PCP to make Coumadin adjustments based on INR 3. Patient to follow-up with orthopedics  Discharge Diagnoses:  Active Hospital Problems   Diagnosis Date Noted   Small bowel obstruction ([place]) 03/08/2019    Resolved Hospital Problems  No resolved problems to display.    Discharge Condition: Stable  Diet recommendation: Heart healthy/modified carb  Vitals:   03/11/19 1955 03/12/19 0631  BP: 106/65 136/85  Pulse: 79 80  Resp: 20 20  Temp: 98.9 F (37.2 C) 97.9 F (36.6 C)  SpO2: 98% 100%    History of present illness:  FayeHammondis a 68 y.o.femalewith a known history of hypertension, hypothyroidism, CVA, GERD, COPD home O2 dependent on 3 Lpresents to the emergency department for evaluation of abdominal pain. Patient was in a usual state of health until hospitalization for fall with a fibular fracture. She had been taking narcotic pain medication for pain control. Reports no bowel movement in the past week. For the last day she has had multiple episodes of bilious vomiting associated with abdominal pain, bloating. Symptoms have been refractory to enemas and stool softeners. Patient denies fevers/chills, weakness, dizziness, chest pain, shortness of breath, dysuria/frequency, changes in mental status. In the ED, CT abdomen showed SBO. Pt admitted for further management.   Today, patient denied any new complaints.  Patient denies any worsening shortness of breath, chest pain, abdominal pain, nausea/vomiting, fever/chills.  Patient educated extensively on the need to follow-up very closely with her PCP.  Patient will be discharged on subcu Lovenox until INR is therapeutic.   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  RN instructed to watch pt give herself Lovenox.  Patient reported her PCP is very sore and on top of things.  Advised her to make an appointment as soon as possible for possible INR checks on 03/13/2019 and 03/15/2019.  Patient already has home health PT, RN set up. Continues to refuse SNF.   Hospital Course:  Active Problems:   Small bowel obstruction (HCC)  Small bowel obstruction Vs ileus Resolved CT abdomen showed possible small bowel obstruction General surgery consulted, signed off Pain management with non-opoid meds  LLE DVT Bilateral vascular duplex ultrasound showed LLE DVT Likely from recent fracture and poor mobilzing VQ scan very low probability for PE Not a candidate for NOAC due to BMI (morbid obesity) Continue coumadin, bridge with  lovenox until INR therapeutic Follow-up with PCP very closely to arrange for frequent INR checks as mentioned above  AKI on CKD stage III Improving--> back to baseline Baseline creatinine about 1.7 Continue home Bumex Follow-up with PCP with repeat labs  Leukocytosis Resolved Likely ?reactive Monitor closely, daily CBC  Macrocytic anemia Vitamin B-12, 482, folate levels 827 Follow-up with PCP  Hypokalemia Continue potassium supplements  Hypertension Continue home metoprolol, torsemide  Diabetes mellitus type 2 Continue home regimen  Hypothyroidism Continue home Synthroid  Left fibula non-displaced fracture Follow up with orthopedics Continue Knee immobilizer HHPT  COPD ContinueO2, nebulizers, Flonase  Anxiety/depression/insomnia Continuebuspirone, lorazepam, quetiapine, Ambien  Morbid obesity Lifestyle modification advised             Malnutrition Type:  Malnutrition Characteristics:      Nutrition Interventions:      Estimated body mass index is 54.87  kg/m as calculated from the following:   Height as of this encounter: _0  (1.575 m).   Weight as of this encounter: 136.1 kg.    Procedures:  None  Consultations:  None  Discharge Exam: BP 136/85 (BP Location: Right Arm)    Pulse 80    Temp 97.9 F (36.6 C) (Oral)    Resp 20    Ht _1  (1.575 m)    Wt 136.1 kg    SpO2 100%    BMI 54.87 kg/m   General: NAD Cardiovascular: S1-S2 present Respiratory: CTA B  Discharge Instructions You were cared for by a hospitalist during your hospital stay. If you have any questions about your discharge medications or the care you received while you were in the hospital after you are discharged, you can call the unit and asked to speak with the hospitalist on call if the hospitalist that took care of you is not available. Once you are discharged, your primary care physician will handle any further medical issues. Please note that NO REFILLS for any discharge medications will be authorized once you are discharged, as it is imperative that you return to your primary care physician (or establish a relationship with a primary care physician if you do not have one) for your aftercare needs so that they can reassess your need for medications and monitor your lab values.   Allergies as of 03/12/2019      Reactions   Norvasc [amlodipine Besylate]    Marked swelling      Medication List    STOP taking these medications   linaclotide 145 MCG Caps capsule Commonly known as: Linzess     TAKE these medications   albuterol 108 (90 Base) MCG/ACT inhaler Commonly known as: VENTOLIN HFA Inhale 1-2 puffs into the lungs every 6 (six) hours as needed for wheezing or shortness of breath.   albuterol (2.5 MG/3ML) 0.083% nebulizer solution Commonly known as: PROVENTIL Take 3 mLs (2.5 mg total) by nebulization every 6 (six) hours as needed for wheezing or shortness of breath.   blood glucose meter kit and supplies Kit Dispense based on patient and insurance  preference. Use up to four times daily as directed. (FOR ICD-9 250.00, 250.01).   busPIRone 15 MG tablet Commonly known as: BUSPAR Take 15 mg by mouth 2 (two) times daily.   enoxaparin 150 MG/ML injection Commonly known as: LOVENOX Inject 0.93 mLs (140 mg total) into the skin every 12 (twelve) hours for 10 days.   famotidine 20 MG tablet Commonly known as: PEPCID Take 20 mg by mouth daily as needed for heartburn or indigestion.   fluticasone 50 MCG/ACT nasal spray Commonly known as: FLONASE Place 2 sprays into both nostrils daily as needed for allergies.   glipiZIDE 5 MG tablet Commonly known as: GLUCOTROL Take 0.5 tablets (2.5 mg total) by mouth 2 (two) times daily before a meal.   insulin glargine 100 UNIT/ML injection Commonly known as: LANTUS Inject 0.3 mLs (30 Units total) into the skin at bedtime.   Insulin Syringe-Needle U-100 28G X 5/16" 0.5 ML Misc Use with lantus once a day   levothyroxine 50 MCG tablet Commonly known as: SYNTHROID Take 1 tablet (50 mcg total) by mouth daily.   LORazepam 1 MG tablet Commonly known as: ATIVAN Take 0.5-1.5 mg by mouth See admin instructions. Take 0.38m by mouth in the  morning and 1.'5mg'$  by mouth at bedtime as needed for anxiety.   metFORMIN 500 MG tablet Commonly known as: Glucophage Take 2 tablets (1,000 mg total) by mouth 2 (two) times daily with a meal.   methocarbamol 500 MG tablet Commonly known as: ROBAXIN Take 1 tablet (500 mg total) by mouth every 6 (six) hours as needed for muscle spasms.   metoprolol succinate 50 MG 24 hr tablet Commonly known as: TOPROL-XL TAKE 2 TABLETS BY MOUTH ONCE DAILY IN THE MORNING WITH A MEAL OR IMMEDIATELY FOLLOWING A MEAL What changed:   how much to take  how to take this  when to take this  additional instructions   ondansetron 4 MG tablet Commonly known as: Zofran Take 1 tablet (4 mg total) by mouth every 8 (eight) hours as needed for nausea or vomiting.   OXYGEN Inhale 3 L  into the lungs continuous.   potassium chloride SA 20 MEQ tablet Commonly known as: K-DUR Take 1 tablet (20 mEq total) by mouth daily.   QUEtiapine 100 MG tablet Commonly known as: SEROQUEL Take 100 mg by mouth at bedtime.   torsemide 20 MG tablet Commonly known as: DEMADEX Take 1 tablet (20 mg total) by mouth daily. What changed:   how much to take  additional instructions   traMADol 50 MG tablet Commonly known as: ULTRAM Take 50 mg by mouth every 6 (six) hours as needed for moderate pain.   warfarin 5 MG tablet Commonly known as: COUMADIN Take 1.5 tablets (7.5 mg total) by mouth daily at 6 PM. Take 1.5 tabs daily every evening until further instruction from your PCP pending your blood work   zolpidem 5 MG tablet Commonly known as: AMBIEN Take 2 tablets (10 mg total) by mouth at bedtime.      Allergies  Allergen Reactions   Norvasc [Amlodipine Besylate]     Marked swelling   Follow-up Information    Sue Held, DO. Schedule an appointment as soon as possible for a visit in 1 week(s).   Specialty: Family Medicine Why: Call her to place an order for blood work (INR) for you for 03/13/19 and 03/15/19 Contact information: [address] [place] [address] [phone]        Rod Can, MD. Schedule an appointment as soon as possible for a visit in 2 week(s).   Specialty: Orthopedic Surgery Why: Call to make an appointment Contact information: [address] [place] [address] [phone]            The results of significant diagnostics from this hospitalization (including imaging, microbiology, ancillary and laboratory) are listed below for reference.    Significant Diagnostic Studies: Ct Abdomen Pelvis Wo Contrast  Result Date: 03/08/2019 CLINICAL DATA:  Abdominal pain for 1 week. EXAM: CT ABDOMEN AND PELVIS WITHOUT CONTRAST TECHNIQUE: Multidetector CT imaging of the abdomen and pelvis was performed  following the standard protocol without IV contrast. COMPARISON:  Abdominal ultrasound 11/04/2016 FINDINGS: Lower chest: Centrilobular emphysema and bibasilar scarring. No pleural effusion. Normal heart size. Hepatobiliary: No focal liver abnormality is seen on this unenhanced study. Multiple calcified stones are present in the gallbladder without evidence of acute cholecystitis. There is no biliary dilatation. Pancreas: Unremarkable. Spleen: Unremarkable. Adrenals/Urinary Tract: Unremarkable adrenal glands. No evidence of renal calculi or hydronephrosis. Slightly small renal size bilaterally. Unremarkable bladder. Stomach/Bowel: The stomach is unremarkable. There is relatively diffuse dilatation of small bowel loops which measure up to nearly 4 cm in diameter with  numerous air-fluid levels. A well-defined transition point is not evident, however there is a generalized transition in the central to right lower abdomen with the distal ileum being decompressed. Gas is present in nondilated ascending and transverse colon while the descending and sigmoid colon and rectum are largely collapsed. A few sigmoid colon diverticula are noted without evidence diverticulitis. The appendix is unremarkable. Vascular/Lymphatic: Mildly enlarged lymph nodes in the porta hepatis measuring up to 1.4 cm in short axis, likely reactive and related to the patient's history of chronic hepatitis C infection. Aortic atherosclerosis without aneurysm. Reproductive: Status post hysterectomy. No adnexal masses. Other: No intraperitoneal free fluid. Musculoskeletal: Advanced lower lumbar facet arthrosis with grade 1 anterolisthesis of L4 on L5. IMPRESSION: 1. Diffuse small bowel dilatation with decompressed distal ileum consistent with obstruction. 2. Cholelithiasis. 3. Aortic Atherosclerosis (ICD10-I70.0) and Emphysema (ICD10-J43.9). Electronically Signed   By: Logan Bores M.D.   On: 03/08/2019 19:51   Dg Tibia/fibula Left  Result Date:  02/24/2019 CLINICAL DATA:  Per ems: Pt coming from home c/o fall which resulted in her "left leg getting twisted under her." Pt unable to bend knee but no other deformities noted. EXAM: LEFT TIBIA AND FIBULA - 2 VIEW COMPARISON:  None. FINDINGS: There are nondisplaced fractures of the lateral margin of the lateral tibial plateau and of the proximal fibula as detailed under the left knee radiographs. No other fractures.  Knee and ankle joints are normally aligned. Mild diffuse ankle soft tissue edema. IMPRESSION: 1. Nondisplaced fractures the lateral margin of the lateral tibial plateau and the proximal fibula. 2. No other fractures.  No dislocation. Electronically Signed   By: Lajean Manes M.D.   On: 02/24/2019 22:18   Mr Knee Left Wo Contrast  Result Date: 02/25/2019 CLINICAL DATA:  Left knee twisting injury due to a fall 02/24/2019. Pain. Initial encounter. EXAM: MRI OF THE LEFT KNEE WITHOUT CONTRAST TECHNIQUE: Multiplanar, multisequence MR imaging of the knee was performed. No intravenous contrast was administered. COMPARISON:  Plain films left knee 02/24/2019. FINDINGS: MENISCI Medial meniscus: Complex tear in the posterior horn includes a radial component through the meniscal root. Lateral meniscus:  Intact. LIGAMENTS Cruciates:  Intact. Collaterals:  Intact. CARTILAGE Patellofemoral: Thinning is most notable along the lateral femoral trochlea. Medial:  Thinned with associated joint space narrowing. Lateral:  Mildly to moderately degenerated. Joint:  Small effusion. Popliteal Fossa:  Small Baker's cyst. Extensor Mechanism:  Intact. Bones: Acute, comminuted fracture of the fibular head and neck and a fracture of the lateral periphery of the tibia just posterior to the iliotibial band insertion are seen as on plain films. Bulky tricompartmental osteophytes are seen. Other: None. IMPRESSION: Acute fractures of the proximal fibula and periphery of the lateral tibia as seen on plain films today. Complex tear  posterior horn medial meniscus includes a radial component through the meniscal root. Negative for lateral meniscus, cruciate or collateral ligament tear. Tricompartmental osteoarthritis. Electronically Signed   By: Inge Rise M.D.   On: 02/25/2019 07:14   Nm Pulmonary Perf And Vent  Result Date: 03/11/2019 CLINICAL DATA:  Shortness of breath EXAM: NUCLEAR MEDICINE VENTILATION - PERFUSION LUNG SCAN VIEWS: Anterior, posterior, left lateral, right lateral, RPO, LPO, RAO, LAO-ventilation and perfusion RADIOPHARMACEUTICALS:  28.4 mCi of Tc-13mDTPA aerosol inhalation and 1.6 mCi Tc960mAA IV COMPARISON:  Chest radiograph March 11 2019 FINDINGS: Ventilation: Radiotracer uptake is homogeneous and symmetric bilaterally. No appreciable ventilation defects evident. Perfusion: Radiotracer uptake is homogeneous and symmetric bilaterally. No appreciable perfusion defects  evident. IMPRESSION: No appreciable ventilation or perfusion defects. Very low probability of pulmonary embolus. Electronically Signed   By: Lowella Grip III M.D.   On: 03/11/2019 15:12   Dg Chest Port 1 View  Result Date: 03/11/2019 CLINICAL DATA:  COPD, follow-up, history hypertension, GERD, abusive D EXAM: PORTABLE CHEST 1 VIEW COMPARISON:  Portable exam 1015 hours compared to 08/15/2018 FINDINGS: Upper normal size of cardiac silhouette with minimal vascular congestion. Mediastinal contours normal. Lungs clear. No pulmonary infiltrate, pleural effusion or pneumothorax. Osseous structures unremarkable. IMPRESSION: No acute abnormalities. Electronically Signed   By: Lavonia Dana M.D.   On: 03/11/2019 10:35   Dg Knee Complete 4 Views Left  Result Date: 02/24/2019 CLINICAL DATA:  Per ems: Pt coming from home c/o fall which resulted in her "left leg getting twisted under her." Pt unable to bend knee but no other deformities noted. EXAM: LEFT KNEE - COMPLETE 4+ VIEW COMPARISON:  None. FINDINGS: There is fracture of the proximal fibula  extending from the fibular head to the medial proximal shaft, nondisplaced. There is another nondisplaced fracture, from the lateral margin of the lateral tibial plateau, not involving the articular surface. No other fractures. There is medial compartment joint space narrowing with marginal osteophytes from all 3 compartments. Mild lateral soft tissue edema is noted. IMPRESSION: 1. Nondisplaced fracture of the proximal fibula in from the lateral margin of the lateral tibial plateau. 2. No other acute abnormality. 3. Moderate osteoarthritis. Electronically Signed   By: Lajean Manes M.D.   On: 02/24/2019 22:17   Dg Abd Portable 1v-small Bowel Obstruction Protocol-initial, 8 Hr Delay  Result Date: 03/09/2019 CLINICAL DATA:  Small-bowel obstruction EXAM: PORTABLE ABDOMEN - 1 VIEW COMPARISON:  None. FINDINGS: Again noted are dilated loops of small bowel scattered throughout the abdomen. Oral contrast is seen in the colon. No pneumatosis or free air. There is a moderate amount of stool in the colon. IMPRESSION: Oral contrast is seen to the level of the rectum. There are persistently dilated loops of small bowel scattered throughout the abdomen. Electronically Signed   By: Constance Holster M.D.   On: 03/09/2019 18:51   Dg Foot Complete Left  Result Date: 02/24/2019 CLINICAL DATA:  Per ems: Pt coming from home c/o fall which resulted in her "left leg getting twisted under her." Pt unable to bend knee but no other deformities noted. EXAM: LEFT FOOT - COMPLETE 3+ VIEW COMPARISON:  None. FINDINGS: There are fractures of the fourth and fifth metatarsals. No significant fracture displacement. Fractures extend from the shafts to the bases of the metatarsal heads. No other fractures.  The joints are normally aligned. There is forefoot soft tissue swelling. IMPRESSION: 1. Fractures of the mid to distal fourth and fifth metatarsals without significant displacement or angulation. No dislocation. Electronically Signed   By:  Lajean Manes M.D.   On: 02/24/2019 22:20   Vas [place] Lower Extremity Venous (dvt)  Result Date: 03/11/2019  Lower Venous Study Indications: Pain.  Risk Factors: Trauma. Limitations: Body habitus, poor ultrasound/tissue interface and patient pain tolerance, patient positioning. Comparison Study: No prior studies. Performing Technologist: Oliver Hum RVT  Examination Guidelines: A complete evaluation includes B-mode imaging, spectral Doppler, color Doppler, and power Doppler as needed of all accessible portions of each vessel. Bilateral testing is considered an integral part of a complete examination. Limited examinations for reoccurring indications may be performed as noted.  +---------+---------------+---------+-----------+----------+--------------+  RIGHT     Compressibility Phasicity Spontaneity Properties Summary         +---------+---------------+---------+-----------+----------+--------------+  CFV       Full            Yes       Yes                                    +---------+---------------+---------+-----------+----------+--------------+  SFJ       Full                                                             +---------+---------------+---------+-----------+----------+--------------+  FV Prox   Full                                                             +---------+---------------+---------+-----------+----------+--------------+  FV Mid                    Yes       Yes                                    +---------+---------------+---------+-----------+----------+--------------+  FV Distal                 Yes       Yes                                    +---------+---------------+---------+-----------+----------+--------------+  PFV       Full                                                             +---------+---------------+---------+-----------+----------+--------------+  POP       Full            Yes       Yes                                     +---------+---------------+---------+-----------+----------+--------------+  PTV       Full                                                             +---------+---------------+---------+-----------+----------+--------------+  PERO                                                       Not visualized  +---------+---------------+---------+-----------+----------+--------------+   +---------+---------------+---------+-----------+----------+--------------+  LEFT  Compressibility Phasicity Spontaneity Properties Summary         +---------+---------------+---------+-----------+----------+--------------+  CFV       Full            Yes       Yes                                    +---------+---------------+---------+-----------+----------+--------------+  SFJ       Full                                                             +---------+---------------+---------+-----------+----------+--------------+  FV Prox   Full                                                             +---------+---------------+---------+-----------+----------+--------------+  FV Mid    Full                                                             +---------+---------------+---------+-----------+----------+--------------+  FV Distal                 Yes       Yes                                    +---------+---------------+---------+-----------+----------+--------------+  PFV       Full                                                             +---------+---------------+---------+-----------+----------+--------------+  POP       Partial         Yes       Yes                    Acute           +---------+---------------+---------+-----------+----------+--------------+  PTV       Full                                                             +---------+---------------+---------+-----------+----------+--------------+  PERO                                                       Not visualized   +---------+---------------+---------+-----------+----------+--------------+  Summary: Right: There is no evidence of deep vein thrombosis in the lower extremity. However, portions of this examination were limited- see technologist comments above. Left: Findings consistent with acute deep vein thrombosis involving the left popliteal vein. No cystic structure found in the popliteal fossa.  *See table(s) above for measurements and observations. Electronically signed by Monica Martinez MD on 03/11/2019 at 3:22:05 PM.    Final     Microbiology: Recent Results (from the past 240 hour(s))  SARS Coronavirus 2 (CEPHEID - Performed in [place] hospital lab), Hosp Order     Status: None   Collection Time: 03/09/19  1:58 AM   Specimen: Nasopharyngeal Swab  Result Value Ref Range Status   SARS Coronavirus 2 NEGATIVE NEGATIVE Final    Comment: (NOTE) If result is NEGATIVE SARS-CoV-2 target nucleic acids are NOT DETECTED. The SARS-CoV-2 RNA is generally detectable in upper and lower  respiratory specimens during the acute phase of infection. The lowest  concentration of SARS-CoV-2 viral copies this assay can detect is 250  copies / mL. A negative result does not preclude SARS-CoV-2 infection  and should not be used as the sole basis for treatment or other  patient management decisions.  A negative result may occur with  improper specimen collection / handling, submission of specimen other  than nasopharyngeal swab, presence of viral mutation(s) within the  areas targeted by this assay, and inadequate number of viral copies  (<250 copies / mL). A negative result must be combined with clinical  observations, patient history, and epidemiological information. If result is POSITIVE SARS-CoV-2 target nucleic acids are DETECTED. The SARS-CoV-2 RNA is generally detectable in upper and lower  respiratory specimens dur ing the acute phase of infection.  Positive  results are indicative of active infection  with SARS-CoV-2.  Clinical  correlation with patient history and other diagnostic information is  necessary to determine patient infection status.  Positive results do  not rule out bacterial infection or co-infection with other viruses. If result is PRESUMPTIVE POSTIVE SARS-CoV-2 nucleic acids MAY BE PRESENT.   A presumptive positive result was obtained on the submitted specimen  and confirmed on repeat testing.  While 2019 novel coronavirus  (SARS-CoV-2) nucleic acids may be present in the submitted sample  additional confirmatory testing may be necessary for epidemiological  and / or clinical management purposes  to differentiate between  SARS-CoV-2 and other Sarbecovirus currently known to infect humans.  If clinically indicated additional testing with an alternate test  methodology [phone]) is advised. The SARS-CoV-2 RNA is generally  detectable in upper and lower respiratory sp ecimens during the acute  phase of infection. The expected result is Negative. Fact Sheet for Patients:  StrictlyIdeas.no Fact Sheet for Healthcare Providers: BankingDealers.co.za This test is not yet approved or cleared by the [place] FDA and has been authorized for detection and/or diagnosis of SARS-CoV-2 by FDA under an Emergency Use Authorization (EUA).  This EUA will remain in effect (meaning this test can be used) for the duration of the COVID-19 declaration under Section 564(b)(1) of the Act, 21 U.S.C. section 360bbb-3(b)(1), unless the authorization is terminated or revoked sooner. Performed at [facility], [place] [address], [place], [place] [address]      Labs: Basic Metabolic Panel: Recent Labs  Lab 03/08/19 1720 03/09/19 0522 03/10/19 0545 03/11/19 0600  NA 134* 135 140 137  K 3.8 3.7 3.4* 3.9  CL 92* 97* 102 104  CO2 _0 GLUCOSE 129*  114* 127* 137*  BUN 44* 43* 36* 25*  CREATININE 2.53* 2.43* 1.78*  1.63*  CALCIUM 9.2 8.5* 8.4* 8.2*  MG  --  2.3  --   --   PHOS  --  2.8  --   --    Liver Function Tests: Recent Labs  Lab 03/08/19 1720  AST 22  ALT 17  ALKPHOS 88  BILITOT 0.4  PROT 9.1*  ALBUMIN 3.4*   Recent Labs  Lab 03/08/19 1720  LIPASE 59*   No results for input(s): AMMONIA in the last 168 hours. CBC: Recent Labs  Lab 03/08/19 1720 03/09/19 0522 03/10/19 0545 03/11/19 0600 03/12/19 0542  WBC 13.0* 12.1* 10.9* 11.8* 9.8  NEUTROABS 8.9*  --  7.1 7.3 5.3  HGB 11.1* 10.4* 10.1* 9.7* 9.8*  HCT 36.7 34.6* 34.0*   32.3* 32.6* 31.5*  MCV 101.7* 102.7* 103.0* 103.2* 101.6*  PLT 234 197 197 192 199   Cardiac Enzymes: No results for input(s): CKTOTAL, CKMB, CKMBINDEX, TROPONINI in the last 168 hours. BNP: BNP (last 3 results) No results for input(s): BNP in the last 8760 hours.  ProBNP (last 3 results) No results for input(s): PROBNP in the last 8760 hours.  CBG: Recent Labs  Lab 03/11/19 1649 03/11/19 2102 03/12/19 0041 03/12/19 0359 03/12/19 0744  GLUCAP 93 111* 113* 126* 100*       Signed:  Alma Friendly, MD [facility] 03/12/2019, 11:19 AM

## 2019-03-13 ENCOUNTER — Telehealth: Payer: Self-pay | Admitting: *Deleted

## 2019-03-13 ENCOUNTER — Telehealth: Payer: Self-pay

## 2019-03-13 ENCOUNTER — Telehealth: Payer: Self-pay | Admitting: Family Medicine

## 2019-03-13 NOTE — Telephone Encounter (Signed)
LVM for pt to call office to schedule appt.

## 2019-03-13 NOTE — Telephone Encounter (Signed)
Please advise 

## 2019-03-13 NOTE — Telephone Encounter (Signed)
Copied from [place] [id number]. Topic: General - Other >> Mar 12, 2019 12:11 PM Rainey [place] A wrote: Patients daughter [name] would like a callback from nurse in regards to patient leaving hospital.

## 2019-03-13 NOTE — Telephone Encounter (Signed)
Patient was released from the hospital yesterday, but she was not sent home with any pain meds.  She stated she needs something for her pain, but her body cannot tolerate Fentanylo, Morphine or Vicodin.

## 2019-03-13 NOTE — Telephone Encounter (Signed)
That's basically everything unless she can tolerate tramadol

## 2019-03-14 ENCOUNTER — Ambulatory Visit (INDEPENDENT_AMBULATORY_CARE_PROVIDER_SITE_OTHER): Payer: Medicare HMO | Admitting: Family Medicine

## 2019-03-14 ENCOUNTER — Other Ambulatory Visit: Payer: Self-pay

## 2019-03-14 DIAGNOSIS — S82102A Unspecified fracture of upper end of left tibia, initial encounter for closed fracture: Secondary | ICD-10-CM | POA: Diagnosis not present

## 2019-03-14 DIAGNOSIS — I824Y3 Acute embolism and thrombosis of unspecified deep veins of proximal lower extremity, bilateral: Secondary | ICD-10-CM

## 2019-03-14 DIAGNOSIS — I824Y2 Acute embolism and thrombosis of unspecified deep veins of left proximal lower extremity: Secondary | ICD-10-CM

## 2019-03-14 DIAGNOSIS — J9611 Chronic respiratory failure with hypoxia: Secondary | ICD-10-CM | POA: Diagnosis not present

## 2019-03-14 DIAGNOSIS — N289 Disorder of kidney and ureter, unspecified: Secondary | ICD-10-CM | POA: Diagnosis not present

## 2019-03-14 DIAGNOSIS — K56609 Unspecified intestinal obstruction, unspecified as to partial versus complete obstruction: Secondary | ICD-10-CM | POA: Diagnosis not present

## 2019-03-14 DIAGNOSIS — D539 Nutritional anemia, unspecified: Secondary | ICD-10-CM

## 2019-03-14 DIAGNOSIS — N189 Chronic kidney disease, unspecified: Secondary | ICD-10-CM

## 2019-03-14 MED ORDER — TRAMADOL HCL 50 MG PO TABS: 50.0000 mg | ORAL_TABLET | Freq: Three times a day (TID) | ORAL | 0 refills | Status: AC | PRN

## 2019-03-14 MED ORDER — METHOCARBAMOL 500 MG PO TABS: 500.0000 mg | ORAL_TABLET | Freq: Four times a day (QID) | ORAL | 1 refills | Status: DC

## 2019-03-14 NOTE — Progress Notes (Signed)
Virtual Visit via Video Note  I connected with Sue [name] on 03/17/19 at  3:15 PM EDT by a video enabled telemedicine application and verified that I am speaking with the correct person using two identifiers.  Location: Patient: home with daughter  Provider: office    I discussed the limitations of evaluation and management by telemedicine and the availability of in person appointments. The patient expressed understanding and agreed to proceed.  History of Present Illness: Pt was in the hospital 6/26--6/30 with L DVT,sbo AKI on CKD stage III   Pt was d/c on  140 mg lovenox a day and coumadin 7.5 mg daily ---  She needs PT/ INR checked but she is home and can not get to office.  Home health will be coming to office    Observations/Objective: No vitals obtained  Pt in NAD She said her legs are still painful   Assessment and Plan: 1. Acute deep vein thrombosis (DVT) of proximal vein of both lower extremities (HCC) Home health to check PT INR  - traMADol (ULTRAM) 50 MG tablet; Take 1 tablet (50 mg total) by mouth every 8 (eight) hours as needed for up to 5 days.  Dispense: 30 tablet; Refill: 0 - methocarbamol (ROBAXIN) 500 MG tablet; Take 1 tablet (500 mg total) by mouth 4 (four) times daily.  Dispense: 30 tablet; Refill: 1  2. Chronic respiratory failure with hypoxia (HCC) On O2 F/u pulmonary  3. Small bowel obstruction (HCC) Symptoms resolved   4. Acute deep vein thrombosis (DVT) of proximal vein of left lower extremity (HCC) con't coumadin and lovenox Home health to check pt Inr   5. Acute on chronic renal insufficiency Check labs   6. Macrocytic anemia   7. Closed fracture of proximal end of left tibia, unspecified fracture morphology, initial encounter F/u ortho    Follow Up Instructions:    I discussed the assessment and treatment plan with the patient. The patient was provided an opportunity to ask questions and all were answered. The patient agreed with the  plan and demonstrated an understanding of the instructions.   The patient was advised to call back or seek an in-person evaluation if the symptoms worsen or if the condition fails to improve as anticipated.  I provided 15 minutes of non-face-to-face time during this encounter.   Ann Held, DO

## 2019-03-17 ENCOUNTER — Encounter: Payer: Self-pay | Admitting: Family Medicine

## 2019-03-17 DIAGNOSIS — D539 Nutritional anemia, unspecified: Secondary | ICD-10-CM | POA: Insufficient documentation

## 2019-03-17 DIAGNOSIS — N189 Chronic kidney disease, unspecified: Secondary | ICD-10-CM | POA: Insufficient documentation

## 2019-03-17 DIAGNOSIS — I82409 Acute embolism and thrombosis of unspecified deep veins of unspecified lower extremity: Secondary | ICD-10-CM | POA: Insufficient documentation

## 2019-03-17 NOTE — Assessment & Plan Note (Signed)
Symptoms resolved Recheck labs

## 2019-03-17 NOTE — Assessment & Plan Note (Signed)
On O2 Per pulmonary 

## 2019-03-17 NOTE — Assessment & Plan Note (Signed)
F/u nephrology 

## 2019-03-17 NOTE — Assessment & Plan Note (Signed)
Fu ortho

## 2019-03-18 ENCOUNTER — Telehealth: Payer: Self-pay | Admitting: Family Medicine

## 2019-03-18 NOTE — Telephone Encounter (Signed)
Tried calling Sue [name] back at number provided- wrong number.

## 2019-03-18 NOTE — Telephone Encounter (Signed)
Patient's daughter [name] calling and states that she called on Friday 03/15/2019 to get approval from Dr Etter Sjogren for [place] to come out and draw the patient's labs. States that it is for coumadin and warfarin levels. Also mentions that [place] said that she had to call to request that therapy orders be resumed once patient is out of the hospital. Please advise.  CB#: [phone]

## 2019-03-18 NOTE — Telephone Encounter (Signed)
Pt actually using [facility] now.

## 2019-03-18 NOTE — Telephone Encounter (Signed)
Caller/Agency: [facility] Number: [phone] ok to leave leave verbal on vm Requesting OT/PT/Skilled Nursing/Social Work/Speech Therapy: Nursing, PT, OT to resume services Frequency: Patient was hospitalized 03/08/19-03/12/19 need ok that it is ok to go back into the home of the patient. Will call back with the frequency after get to ok to resume.

## 2019-03-18 NOTE — Telephone Encounter (Signed)
[phone] maybe? This is the number that popped on my recent calls for [facility].

## 2019-03-18 NOTE — Telephone Encounter (Signed)
Home Health Verbal Orders - Caller/Agency: Deana/ [facility] Callback Number: [phone] secure line Requesting OT/PT/Skilled Nursing/Social Work/Speech Therapy: OT Frequency: 1x for 8wks.

## 2019-03-19 DIAGNOSIS — S92302D Fracture of unspecified metatarsal bone(s), left foot, subsequent encounter for fracture with routine healing: Secondary | ICD-10-CM | POA: Diagnosis not present

## 2019-03-19 DIAGNOSIS — M79672 Pain in left foot: Secondary | ICD-10-CM | POA: Diagnosis not present

## 2019-03-19 DIAGNOSIS — M25562 Pain in left knee: Secondary | ICD-10-CM | POA: Diagnosis not present

## 2019-03-19 DIAGNOSIS — S82832D Other fracture of upper and lower end of left fibula, subsequent encounter for closed fracture with routine healing: Secondary | ICD-10-CM | POA: Diagnosis not present

## 2019-03-19 NOTE — Telephone Encounter (Signed)
Verbal order given to [facility]

## 2019-03-19 NOTE — Telephone Encounter (Signed)
Spoke with pt's daughter. Notified her of verbal orders confirmed yesterday and verbal orders done today.

## 2019-03-20 ENCOUNTER — Telehealth: Payer: Self-pay | Admitting: Family Medicine

## 2019-03-20 ENCOUNTER — Telehealth: Payer: Self-pay

## 2019-03-20 DIAGNOSIS — S83242D Other tear of medial meniscus, current injury, left knee, subsequent encounter: Secondary | ICD-10-CM | POA: Diagnosis not present

## 2019-03-20 DIAGNOSIS — J439 Emphysema, unspecified: Secondary | ICD-10-CM | POA: Diagnosis not present

## 2019-03-20 DIAGNOSIS — S89202D Unspecified physeal fracture of upper end of left fibula, subsequent encounter for fracture with routine healing: Secondary | ICD-10-CM | POA: Diagnosis not present

## 2019-03-20 DIAGNOSIS — J449 Chronic obstructive pulmonary disease, unspecified: Secondary | ICD-10-CM | POA: Diagnosis not present

## 2019-03-20 DIAGNOSIS — N183 Chronic kidney disease, stage 3 (moderate): Secondary | ICD-10-CM | POA: Diagnosis not present

## 2019-03-20 DIAGNOSIS — J9601 Acute respiratory failure with hypoxia: Secondary | ICD-10-CM | POA: Diagnosis not present

## 2019-03-20 DIAGNOSIS — S82142D Displaced bicondylar fracture of left tibia, subsequent encounter for closed fracture with routine healing: Secondary | ICD-10-CM | POA: Diagnosis not present

## 2019-03-20 DIAGNOSIS — E1122 Type 2 diabetes mellitus with diabetic chronic kidney disease: Secondary | ICD-10-CM | POA: Diagnosis not present

## 2019-03-20 DIAGNOSIS — S92342D Displaced fracture of fourth metatarsal bone, left foot, subsequent encounter for fracture with routine healing: Secondary | ICD-10-CM | POA: Diagnosis not present

## 2019-03-20 DIAGNOSIS — W0110XD Fall on same level from slipping, tripping and stumbling with subsequent striking against unspecified object, subsequent encounter: Secondary | ICD-10-CM | POA: Diagnosis not present

## 2019-03-20 DIAGNOSIS — I129 Hypertensive chronic kidney disease with stage 1 through stage 4 chronic kidney disease, or unspecified chronic kidney disease: Secondary | ICD-10-CM | POA: Diagnosis not present

## 2019-03-20 DIAGNOSIS — E119 Type 2 diabetes mellitus without complications: Secondary | ICD-10-CM | POA: Diagnosis not present

## 2019-03-20 DIAGNOSIS — S92332D Displaced fracture of third metatarsal bone, left foot, subsequent encounter for fracture with routine healing: Secondary | ICD-10-CM | POA: Diagnosis not present

## 2019-03-20 NOTE — Telephone Encounter (Signed)
Sharyn Lull with [facility] called regarding patient's  INR today. Patient is taking 7.5mg  Coumadin daily and started on Lovenox 150 mg daily on 03/12/2019. Sig reads Inject 0.93 mLs (140 mg total) into the skin every 12 (twelve) hours for 10 days.  Patient's INR today is 7.6. Please advise on what patient needs to do.  Also Sharyn Lull will need to be called back with instructions of when she is to go back out to the ptient's home and recheck INR.

## 2019-03-20 NOTE — Telephone Encounter (Signed)
Home Health Verbal Orders - Caller/Agency: Michelle/ [facility] Callback Number: [phone] Requesting OT/PT/Skilled Nursing/Social Work/Speech Therapy: Skilled nursing Frequency: 1x for 7wks.

## 2019-03-20 NOTE — Telephone Encounter (Signed)
Received call from [facility] Pt was seen by Dr. Etter Sjogren on 7/2 She was dx with DVT during recent hospital stay in June She was sent home with lovenox bridge and coumadin INR today was supra therapeutic at INR 7.6 Per D/C summary she was to bridge with lovenox just until INR therapeutic   Current Coumadin 7.5 x 7 = 52.5 mg/week She has 5 mg tabs on hand Need to hold until INR is 2-3, restart with 15% reduction which is about 7.5 mg less per week - recommend 7.5 four days a week and 5 mg 3 days a week = 45mg /week  I called and spoke to [place]- pt will hold her coumadin and lovenox, recheck INR this Friday and restart reduced dose of coumadin when INR is under 3   I called pt and let her know. She did not take her coumadin yet today - she has no bleeding She states understanding of our plan.

## 2019-03-20 NOTE — Telephone Encounter (Signed)
Called back and left a message with confirmation for verbal orders. Didn't see the INR message that was routed to Dr. Lorelei Pont regarding the INR.

## 2019-03-21 ENCOUNTER — Telehealth: Payer: Self-pay | Admitting: Family Medicine

## 2019-03-21 DIAGNOSIS — S92332D Displaced fracture of third metatarsal bone, left foot, subsequent encounter for fracture with routine healing: Secondary | ICD-10-CM | POA: Diagnosis not present

## 2019-03-21 DIAGNOSIS — I129 Hypertensive chronic kidney disease with stage 1 through stage 4 chronic kidney disease, or unspecified chronic kidney disease: Secondary | ICD-10-CM | POA: Diagnosis not present

## 2019-03-21 DIAGNOSIS — W0110XD Fall on same level from slipping, tripping and stumbling with subsequent striking against unspecified object, subsequent encounter: Secondary | ICD-10-CM | POA: Diagnosis not present

## 2019-03-21 DIAGNOSIS — N183 Chronic kidney disease, stage 3 (moderate): Secondary | ICD-10-CM | POA: Diagnosis not present

## 2019-03-21 DIAGNOSIS — S83242D Other tear of medial meniscus, current injury, left knee, subsequent encounter: Secondary | ICD-10-CM | POA: Diagnosis not present

## 2019-03-21 DIAGNOSIS — S92342D Displaced fracture of fourth metatarsal bone, left foot, subsequent encounter for fracture with routine healing: Secondary | ICD-10-CM | POA: Diagnosis not present

## 2019-03-21 DIAGNOSIS — S89202D Unspecified physeal fracture of upper end of left fibula, subsequent encounter for fracture with routine healing: Secondary | ICD-10-CM | POA: Diagnosis not present

## 2019-03-21 DIAGNOSIS — S82142D Displaced bicondylar fracture of left tibia, subsequent encounter for closed fracture with routine healing: Secondary | ICD-10-CM | POA: Diagnosis not present

## 2019-03-21 DIAGNOSIS — E119 Type 2 diabetes mellitus without complications: Secondary | ICD-10-CM | POA: Diagnosis not present

## 2019-03-21 NOTE — Telephone Encounter (Signed)
Home Health Verbal Orders - Caller/Agency: Tim/ [facility] Callback Number: [phone] [id number] OT/PT/Skilled Nursing/Social Work/Speech Therapy: PT Frequency: 1wk for 6x  Tim also requesting clarification on oxygen for pt. Please advise.

## 2019-03-21 NOTE — Telephone Encounter (Signed)
Thank you :)

## 2019-03-22 ENCOUNTER — Ambulatory Visit: Payer: Self-pay | Admitting: Family Medicine

## 2019-03-22 DIAGNOSIS — W0110XD Fall on same level from slipping, tripping and stumbling with subsequent striking against unspecified object, subsequent encounter: Secondary | ICD-10-CM | POA: Diagnosis not present

## 2019-03-22 DIAGNOSIS — S92332D Displaced fracture of third metatarsal bone, left foot, subsequent encounter for fracture with routine healing: Secondary | ICD-10-CM | POA: Diagnosis not present

## 2019-03-22 DIAGNOSIS — N183 Chronic kidney disease, stage 3 (moderate): Secondary | ICD-10-CM | POA: Diagnosis not present

## 2019-03-22 DIAGNOSIS — I129 Hypertensive chronic kidney disease with stage 1 through stage 4 chronic kidney disease, or unspecified chronic kidney disease: Secondary | ICD-10-CM | POA: Diagnosis not present

## 2019-03-22 DIAGNOSIS — S82142D Displaced bicondylar fracture of left tibia, subsequent encounter for closed fracture with routine healing: Secondary | ICD-10-CM | POA: Diagnosis not present

## 2019-03-22 DIAGNOSIS — S89202D Unspecified physeal fracture of upper end of left fibula, subsequent encounter for fracture with routine healing: Secondary | ICD-10-CM | POA: Diagnosis not present

## 2019-03-22 DIAGNOSIS — E119 Type 2 diabetes mellitus without complications: Secondary | ICD-10-CM | POA: Diagnosis not present

## 2019-03-22 DIAGNOSIS — S83242D Other tear of medial meniscus, current injury, left knee, subsequent encounter: Secondary | ICD-10-CM | POA: Diagnosis not present

## 2019-03-22 DIAGNOSIS — S92342D Displaced fracture of fourth metatarsal bone, left foot, subsequent encounter for fracture with routine healing: Secondary | ICD-10-CM | POA: Diagnosis not present

## 2019-03-22 NOTE — Telephone Encounter (Signed)
OK to give all orders regarding services. Not sure what her current O2 settings are. I usually start people on 2L via [place] daily. See if they know if she is doing differently if they do not then give that order. Then have them check O2 sats at rest, with ambulation and qhs next week to assess response

## 2019-03-22 NOTE — Telephone Encounter (Signed)
Please read below. I also sent you a message regarding patient's use of O2. I gave verbal to have patients O2 checked at the times you recommended and they asked about when to recheck her labs and they suggested Monday.

## 2019-03-22 NOTE — Telephone Encounter (Signed)
Clair Gulling called stating pts is on oxygen. Clair Gulling is requesting clairification on dosage. Pt states she is on 3 liters per minute continuously. Please advise.

## 2019-03-22 NOTE — Telephone Encounter (Signed)
Sue [name] with [facility] reports  PT 58.2  INR 4.9  Today. Reports left leg is slightly swollen. Request an order for recheck PT and INR. Contact number for Sue [name] - [phone]. Please advise.

## 2019-03-22 NOTE — Telephone Encounter (Signed)
I will call regarding verbal orders. I need clarification for oxygen use.

## 2019-03-22 NOTE — Telephone Encounter (Signed)
Left message for confirmation on verbal orders. Stated that we need to know if patient is currently taking O2 before we can give an order.

## 2019-03-22 NOTE — Telephone Encounter (Signed)
Pt currently using 3L of 02. I will call regarding stats at rest, ambulation qhs

## 2019-03-24 NOTE — Telephone Encounter (Signed)
Hold coumadin, recheck INR on 7/13. I sent back a note on O2 already saying they need to recheck her O2 at rest, with ambulation and qhs on RA and on 2L so we can document O2 needs. If she is already on 2L ok to give order to maintain O2 at same amount.

## 2019-03-24 NOTE — Telephone Encounter (Signed)
Read these back to front. OK to give order for 3L for now but check sats as noted but have them check them on 3L

## 2019-03-25 ENCOUNTER — Telehealth: Payer: Self-pay | Admitting: Family Medicine

## 2019-03-25 DIAGNOSIS — Z87891 Personal history of nicotine dependence: Secondary | ICD-10-CM

## 2019-03-25 DIAGNOSIS — S83242D Other tear of medial meniscus, current injury, left knee, subsequent encounter: Secondary | ICD-10-CM | POA: Diagnosis not present

## 2019-03-25 DIAGNOSIS — Z8673 Personal history of transient ischemic attack (TIA), and cerebral infarction without residual deficits: Secondary | ICD-10-CM

## 2019-03-25 DIAGNOSIS — S82142D Displaced bicondylar fracture of left tibia, subsequent encounter for closed fracture with routine healing: Secondary | ICD-10-CM | POA: Diagnosis not present

## 2019-03-25 DIAGNOSIS — N183 Chronic kidney disease, stage 3 (moderate): Secondary | ICD-10-CM

## 2019-03-25 DIAGNOSIS — S92342D Displaced fracture of fourth metatarsal bone, left foot, subsequent encounter for fracture with routine healing: Secondary | ICD-10-CM | POA: Diagnosis not present

## 2019-03-25 DIAGNOSIS — J439 Emphysema, unspecified: Secondary | ICD-10-CM

## 2019-03-25 DIAGNOSIS — S89202D Unspecified physeal fracture of upper end of left fibula, subsequent encounter for fracture with routine healing: Secondary | ICD-10-CM | POA: Diagnosis not present

## 2019-03-25 DIAGNOSIS — W0110XD Fall on same level from slipping, tripping and stumbling with subsequent striking against unspecified object, subsequent encounter: Secondary | ICD-10-CM

## 2019-03-25 DIAGNOSIS — S92332D Displaced fracture of third metatarsal bone, left foot, subsequent encounter for fracture with routine healing: Secondary | ICD-10-CM | POA: Diagnosis not present

## 2019-03-25 DIAGNOSIS — E119 Type 2 diabetes mellitus without complications: Secondary | ICD-10-CM

## 2019-03-25 DIAGNOSIS — I129 Hypertensive chronic kidney disease with stage 1 through stage 4 chronic kidney disease, or unspecified chronic kidney disease: Secondary | ICD-10-CM

## 2019-03-25 NOTE — Telephone Encounter (Signed)
Great. Thank you.

## 2019-03-25 NOTE — Telephone Encounter (Signed)
Got call from [name] with [facility]- she left message on my phone INR today 2.6 They will start back on reduced dose of coumadin as per my previous documentation Called Michelle back and [facility]- ok with this plan, recheck INR in one week Will pass to her PCP to make sure she approves

## 2019-03-25 NOTE — Telephone Encounter (Signed)
Pls call back for OT verbal order to [facility] to [place] [facility] at [phone] for 1 times a wk for 6 wks

## 2019-03-26 NOTE — Telephone Encounter (Signed)
Left VM with confirmation for verbal

## 2019-03-27 DIAGNOSIS — W0110XD Fall on same level from slipping, tripping and stumbling with subsequent striking against unspecified object, subsequent encounter: Secondary | ICD-10-CM | POA: Diagnosis not present

## 2019-03-27 DIAGNOSIS — S82142D Displaced bicondylar fracture of left tibia, subsequent encounter for closed fracture with routine healing: Secondary | ICD-10-CM | POA: Diagnosis not present

## 2019-03-27 DIAGNOSIS — I129 Hypertensive chronic kidney disease with stage 1 through stage 4 chronic kidney disease, or unspecified chronic kidney disease: Secondary | ICD-10-CM | POA: Diagnosis not present

## 2019-03-27 DIAGNOSIS — S92342D Displaced fracture of fourth metatarsal bone, left foot, subsequent encounter for fracture with routine healing: Secondary | ICD-10-CM | POA: Diagnosis not present

## 2019-03-27 DIAGNOSIS — S92332D Displaced fracture of third metatarsal bone, left foot, subsequent encounter for fracture with routine healing: Secondary | ICD-10-CM | POA: Diagnosis not present

## 2019-03-27 DIAGNOSIS — S89202D Unspecified physeal fracture of upper end of left fibula, subsequent encounter for fracture with routine healing: Secondary | ICD-10-CM | POA: Diagnosis not present

## 2019-03-27 DIAGNOSIS — N183 Chronic kidney disease, stage 3 (moderate): Secondary | ICD-10-CM | POA: Diagnosis not present

## 2019-03-27 DIAGNOSIS — S83242D Other tear of medial meniscus, current injury, left knee, subsequent encounter: Secondary | ICD-10-CM | POA: Diagnosis not present

## 2019-03-27 DIAGNOSIS — E119 Type 2 diabetes mellitus without complications: Secondary | ICD-10-CM | POA: Diagnosis not present

## 2019-03-28 DIAGNOSIS — J440 Chronic obstructive pulmonary disease with acute lower respiratory infection: Secondary | ICD-10-CM | POA: Diagnosis not present

## 2019-03-29 ENCOUNTER — Other Ambulatory Visit: Payer: Self-pay | Admitting: Nurse Practitioner

## 2019-03-29 ENCOUNTER — Other Ambulatory Visit: Payer: Self-pay | Admitting: Family Medicine

## 2019-03-29 NOTE — Telephone Encounter (Signed)
Requesting: Tramadol Contract: N/A UDS: N/A Last OV: 03/14/2019 Next OV: N/A Last Refill: 03/14/2019, #30--0 RF Database:   Please advise

## 2019-04-01 DIAGNOSIS — W0110XD Fall on same level from slipping, tripping and stumbling with subsequent striking against unspecified object, subsequent encounter: Secondary | ICD-10-CM | POA: Diagnosis not present

## 2019-04-01 DIAGNOSIS — S89202D Unspecified physeal fracture of upper end of left fibula, subsequent encounter for fracture with routine healing: Secondary | ICD-10-CM | POA: Diagnosis not present

## 2019-04-01 DIAGNOSIS — K56609 Unspecified intestinal obstruction, unspecified as to partial versus complete obstruction: Secondary | ICD-10-CM | POA: Diagnosis not present

## 2019-04-01 DIAGNOSIS — S92332D Displaced fracture of third metatarsal bone, left foot, subsequent encounter for fracture with routine healing: Secondary | ICD-10-CM | POA: Diagnosis not present

## 2019-04-01 DIAGNOSIS — N183 Chronic kidney disease, stage 3 (moderate): Secondary | ICD-10-CM | POA: Diagnosis not present

## 2019-04-01 DIAGNOSIS — S82142D Displaced bicondylar fracture of left tibia, subsequent encounter for closed fracture with routine healing: Secondary | ICD-10-CM | POA: Diagnosis not present

## 2019-04-01 DIAGNOSIS — S92342D Displaced fracture of fourth metatarsal bone, left foot, subsequent encounter for fracture with routine healing: Secondary | ICD-10-CM | POA: Diagnosis not present

## 2019-04-01 DIAGNOSIS — E1122 Type 2 diabetes mellitus with diabetic chronic kidney disease: Secondary | ICD-10-CM | POA: Diagnosis not present

## 2019-04-01 DIAGNOSIS — E119 Type 2 diabetes mellitus without complications: Secondary | ICD-10-CM | POA: Diagnosis not present

## 2019-04-01 DIAGNOSIS — S83242D Other tear of medial meniscus, current injury, left knee, subsequent encounter: Secondary | ICD-10-CM | POA: Diagnosis not present

## 2019-04-01 DIAGNOSIS — I129 Hypertensive chronic kidney disease with stage 1 through stage 4 chronic kidney disease, or unspecified chronic kidney disease: Secondary | ICD-10-CM | POA: Diagnosis not present

## 2019-04-02 ENCOUNTER — Emergency Department (HOSPITAL_BASED_OUTPATIENT_CLINIC_OR_DEPARTMENT_OTHER): Payer: Medicare HMO

## 2019-04-02 ENCOUNTER — Ambulatory Visit (HOSPITAL_BASED_OUTPATIENT_CLINIC_OR_DEPARTMENT_OTHER): Payer: Medicare HMO

## 2019-04-02 ENCOUNTER — Encounter (HOSPITAL_COMMUNITY): Payer: Self-pay

## 2019-04-02 ENCOUNTER — Other Ambulatory Visit: Payer: Self-pay

## 2019-04-02 ENCOUNTER — Telehealth: Payer: Self-pay

## 2019-04-02 ENCOUNTER — Emergency Department (HOSPITAL_COMMUNITY)
Admission: EM | Admit: 2019-04-02 | Discharge: 2019-04-02 | Disposition: A | Discharge status: Home / Self Care | Payer: Medicare HMO | Attending: Emergency Medicine | Admitting: Emergency Medicine

## 2019-04-02 DIAGNOSIS — I129 Hypertensive chronic kidney disease with stage 1 through stage 4 chronic kidney disease, or unspecified chronic kidney disease: Secondary | ICD-10-CM | POA: Diagnosis not present

## 2019-04-02 DIAGNOSIS — Z8673 Personal history of transient ischemic attack (TIA), and cerebral infarction without residual deficits: Secondary | ICD-10-CM | POA: Diagnosis not present

## 2019-04-02 DIAGNOSIS — I10 Essential (primary) hypertension: Secondary | ICD-10-CM | POA: Insufficient documentation

## 2019-04-02 DIAGNOSIS — E039 Hypothyroidism, unspecified: Secondary | ICD-10-CM | POA: Diagnosis not present

## 2019-04-02 DIAGNOSIS — E1122 Type 2 diabetes mellitus with diabetic chronic kidney disease: Secondary | ICD-10-CM | POA: Diagnosis not present

## 2019-04-02 DIAGNOSIS — S89202D Unspecified physeal fracture of upper end of left fibula, subsequent encounter for fracture with routine healing: Secondary | ICD-10-CM | POA: Diagnosis not present

## 2019-04-02 DIAGNOSIS — M7989 Other specified soft tissue disorders: Secondary | ICD-10-CM | POA: Diagnosis not present

## 2019-04-02 DIAGNOSIS — K56609 Unspecified intestinal obstruction, unspecified as to partial versus complete obstruction: Secondary | ICD-10-CM | POA: Diagnosis not present

## 2019-04-02 DIAGNOSIS — R600 Localized edema: Secondary | ICD-10-CM | POA: Diagnosis not present

## 2019-04-02 DIAGNOSIS — Z7901 Long term (current) use of anticoagulants: Secondary | ICD-10-CM | POA: Diagnosis not present

## 2019-04-02 DIAGNOSIS — Z87891 Personal history of nicotine dependence: Secondary | ICD-10-CM | POA: Diagnosis not present

## 2019-04-02 DIAGNOSIS — S82142D Displaced bicondylar fracture of left tibia, subsequent encounter for closed fracture with routine healing: Secondary | ICD-10-CM | POA: Diagnosis not present

## 2019-04-02 DIAGNOSIS — Z794 Long term (current) use of insulin: Secondary | ICD-10-CM | POA: Insufficient documentation

## 2019-04-02 DIAGNOSIS — R791 Abnormal coagulation profile: Secondary | ICD-10-CM

## 2019-04-02 DIAGNOSIS — R609 Edema, unspecified: Secondary | ICD-10-CM | POA: Diagnosis not present

## 2019-04-02 DIAGNOSIS — Z7984 Long term (current) use of oral hypoglycemic drugs: Secondary | ICD-10-CM | POA: Insufficient documentation

## 2019-04-02 DIAGNOSIS — S92332D Displaced fracture of third metatarsal bone, left foot, subsequent encounter for fracture with routine healing: Secondary | ICD-10-CM | POA: Diagnosis not present

## 2019-04-02 DIAGNOSIS — Z79899 Other long term (current) drug therapy: Secondary | ICD-10-CM | POA: Insufficient documentation

## 2019-04-02 DIAGNOSIS — W0110XD Fall on same level from slipping, tripping and stumbling with subsequent striking against unspecified object, subsequent encounter: Secondary | ICD-10-CM | POA: Diagnosis not present

## 2019-04-02 DIAGNOSIS — S83242D Other tear of medial meniscus, current injury, left knee, subsequent encounter: Secondary | ICD-10-CM | POA: Diagnosis not present

## 2019-04-02 DIAGNOSIS — S92342D Displaced fracture of fourth metatarsal bone, left foot, subsequent encounter for fracture with routine healing: Secondary | ICD-10-CM | POA: Diagnosis not present

## 2019-04-02 LAB — CBC WITH DIFFERENTIAL/PLATELET
Abs Immature Granulocytes: 0.02 10*3/uL (ref 0.00–0.07)
Basophils Absolute: 0 10*3/uL (ref 0.0–0.1)
Basophils Relative: 1 %
Eosinophils Absolute: 0.3 10*3/uL (ref 0.0–0.5)
Eosinophils Relative: 4 %
HCT: 35.1 % — ABNORMAL LOW (ref 36.0–46.0)
Hemoglobin: 10.3 g/dL — ABNORMAL LOW (ref 12.0–15.0)
Immature Granulocytes: 0 %
Lymphocytes Relative: 44 %
Lymphs Abs: 2.8 10*3/uL (ref 0.7–4.0)
MCH: 30 pg (ref 26.0–34.0)
MCHC: 29.3 g/dL — ABNORMAL LOW (ref 30.0–36.0)
MCV: 102.3 fL — ABNORMAL HIGH (ref 80.0–100.0)
Monocytes Absolute: 0.5 10*3/uL (ref 0.1–1.0)
Monocytes Relative: 8 %
Neutro Abs: 2.7 10*3/uL (ref 1.7–7.7)
Neutrophils Relative %: 43 %
Platelets: 236 10*3/uL (ref 150–400)
RBC: 3.43 MIL/uL — ABNORMAL LOW (ref 3.87–5.11)
RDW: 14.6 % (ref 11.5–15.5)
WBC: 6.3 10*3/uL (ref 4.0–10.5)
nRBC: 0 % (ref 0.0–0.2)

## 2019-04-02 LAB — PROTIME-INR
INR: 7.9 (ref 0.8–1.2)
Prothrombin Time: 65.1 seconds — ABNORMAL HIGH (ref 11.4–15.2)

## 2019-04-02 LAB — COMPREHENSIVE METABOLIC PANEL
ALT: 16 U/L (ref 0–44)
AST: 27 U/L (ref 15–41)
Albumin: 3.3 g/dL — ABNORMAL LOW (ref 3.5–5.0)
Alkaline Phosphatase: 110 U/L (ref 38–126)
Anion gap: 10 (ref 5–15)
BUN: 18 mg/dL (ref 8–23)
CO2: 30 mmol/L (ref 22–32)
Calcium: 8.5 mg/dL — ABNORMAL LOW (ref 8.9–10.3)
Chloride: 100 mmol/L (ref 98–111)
Creatinine, Ser: 1.79 mg/dL — ABNORMAL HIGH (ref 0.44–1.00)
GFR calc Af Amer: 34 mL/min — ABNORMAL LOW (ref >60)
GFR calc non Af Amer: 29 mL/min — ABNORMAL LOW (ref >60)
Glucose, Bld: 129 mg/dL — ABNORMAL HIGH (ref 70–99)
Potassium: 3.6 mmol/L (ref 3.5–5.1)
Sodium: 140 mmol/L (ref 135–145)
Total Bilirubin: 0.5 mg/dL (ref 0.3–1.2)
Total Protein: 8.3 g/dL — ABNORMAL HIGH (ref 6.5–8.1)

## 2019-04-02 MED ORDER — PHYTONADIONE 5 MG PO TABS
2.5000 mg | ORAL_TABLET | Freq: Once | ORAL | Status: AC
  Administered 2019-04-02: 18:00:00 2.5 mg via ORAL
  Filled 2019-04-02: qty 1

## 2019-04-02 NOTE — ED Notes (Signed)
US at bedside

## 2019-04-02 NOTE — ED Triage Notes (Addendum)
Pt states she was sent over after lab draw by home care nurse for low platelets (143). See scanned blood work.  Pt states she hasn't felt well the last 2 days.

## 2019-04-02 NOTE — ED Provider Notes (Signed)
[facility] DEPT Provider Note   CSN: [id number] Arrival date & time: 04/02/19  1219    History   Chief Complaint Chief Complaint  Patient presents with  . Abnormal Lab    HPI Sue [name] is a 68 y.o. female.     Pt presents to the ED today with an elevated INR.  The pt said she was diagnosed with a DVT in her left leg on 6/28.  She was started on lovenox and coumadin.  The pt's lovenox was just supposed to be until INR was therapeutic.  It looks like she saw her doctor on 7/2 and was told to continue both until they could get an INR.  She had to have home health draw the blood because it was too difficult for her to travel.  The documentation is a little unclear, but pt said she was told to stop lovenox and coumadin on 7/16.  She said she had another recheck today and her INR was over 8.  She has not had any bleeding.  The pt said her legs are swollen, but not as bad as they were 2 weeks ago.     Past Medical History:  Diagnosis Date  . Allergic rhinitis   . Depression   . Emphysema of lung ([place])    3L home O2  . GERD (gastroesophageal reflux disease)   . Hypertension   . Hypothyroidism   . Obesity, morbid, BMI 50 or higher ([place])   . Stroke [facility]) 2016   TIA   . Urine incontinence     Patient Active Problem List   Diagnosis Date Noted  . DVT (deep venous thrombosis) ([place]) 03/17/2019  . Acute on chronic renal insufficiency 03/17/2019  . Macrocytic anemia 03/17/2019  . Small bowel obstruction ([place]) 03/08/2019  . Fracture of left tibia 02/25/2019  . Left tibial fracture 02/25/2019  . Hyperglycemia 08/16/2018  . Morbid obesity due to excess calories ([place]) 05/09/2018  . Acute bronchitis with COPD ([place]) 10/19/2017  . Atypical chest pain 05/01/2017  . Pulmonary emphysema ([place]) 04/06/2017  . Chronic seasonal allergic rhinitis 04/06/2017  . GERD (gastroesophageal reflux disease) 04/06/2017  . Snoring 04/06/2017  . Myalgia 02/01/2017  .  Arthralgia 02/01/2017  . Chronic pain of toe of left foot 11/14/2016  . Chronic hepatitis C without hepatic coma ([place]) 08/01/2016  . Hypothyroidism 10/29/2015  . Mild diastolic dysfunction 26/71/2458  . Edema 01/01/2015  . Lower extremity edema 12/24/2014  . TIA (transient ischemic attack) 07/30/2014  . Weakness 07/29/2014  . Flank pain 11/12/2013  . Chronic respiratory failure ([place]) 10/25/2013  . DOE (dyspnea on exertion) 10/25/2013  . Depression with anxiety 10/15/2013  . Insomnia 10/15/2013  . HTN (hypertension) 10/09/2013  . Hypokalemia 10/09/2013    Past Surgical History:  Procedure Laterality Date  . ABDOMINAL HYSTERECTOMY    . CESAREAN SECTION       OB History   No obstetric history on file.      Home Medications    Prior to Admission medications   Medication Sig Start Date End Date Taking? Authorizing Provider  albuterol (PROVENTIL HFA;VENTOLIN HFA) 108 (90 Base) MCG/ACT inhaler Inhale 1-2 puffs into the lungs every 6 (six) hours as needed for wheezing or shortness of breath.   Yes [provider]  albuterol (PROVENTIL) (2.5 MG/3ML) 0.083% nebulizer solution Take 3 mLs (2.5 mg total) by nebulization every 6 (six) hours as needed for wheezing or shortness of breath. 03/31/17  Yes Roma Schanz  R, DO  busPIRone (BUSPAR) 15 MG tablet Take 15 mg by mouth 2 (two) times daily.  12/27/17  Yes [provider]  famotidine (PEPCID) 20 MG tablet Take 20 mg by mouth daily as needed for heartburn or indigestion.   Yes [provider]  fluticasone (FLONASE) 50 MCG/ACT nasal spray Place 2 sprays into both nostrils daily as needed for allergies. 05/02/17  Yes Johnson, Clanford L, MD  insulin glargine (LANTUS) 100 UNIT/ML injection Inject 0.3 mLs (30 Units total) into the skin at bedtime. 12/06/18  Yes Ann Held, DO  levothyroxine (SYNTHROID) 50 MCG tablet Take 1 tablet (50 mcg total) by mouth daily. 01/03/19  Yes Roma Schanz R, DO   LORazepam (ATIVAN) 1 MG tablet Take 0.5-1.5 mg by mouth See admin instructions. Take 0.62m by mouth in the morning and 1.553mby mouth at bedtime as needed for anxiety.   Yes [provider]  metFORMIN (GLUCOPHAGE) 500 MG tablet Take 2 tablets (1,000 mg total) by mouth 2 (two) times daily with a meal. 08/18/18 08/18/19 Yes Danford, ChSuann LarryMD  methocarbamol (ROBAXIN) 500 MG tablet Take 1 tablet (500 mg total) by mouth every 6 (six) hours as needed for muscle spasms. 02/26/19  Yes Black, KaLezlie OctaveNP  metoprolol succinate (TOPROL-XL) 50 MG 24 hr tablet TAKE 2 TABLETS BY MOUTH ONCE DAILY IN THE MORNING WITH A MEAL OR IMMEDIATELY FOLLOWING A MEAL Patient taking differently: Take 100 mg by mouth daily.  01/03/19  Yes LoRoma Schanz, DO  ondansetron (ZOFRAN) 4 MG tablet Take 1 tablet (4 mg total) by mouth every 8 (eight) hours as needed for nausea or vomiting. 03/05/19  Yes LoRoma Schanz, DO  OXYGEN Inhale 3 L into the lungs continuous.    Yes [provider]  potassium chloride SA (K-DUR) 20 MEQ tablet Take 1 tablet (20 mEq total) by mouth daily. 01/03/19  Yes LoRoma Schanz, DO  QUEtiapine (SEROQUEL) 100 MG tablet Take 100 mg by mouth at bedtime. 02/23/19  Yes [provider]  torsemide (DEMADEX) 20 MG tablet Take 1 tablet (20 mg total) by mouth daily. Patient taking differently: Take 40 mg by mouth daily. Taking 2 tablets(4020mdaily 01/03/19 04/03/19 Yes Lowne ChaLyndal Pulley DO  traMADol (ULTRAM) 50 MG tablet TAKE 1 TABLET BY MOUTH EVERY 8 HOURS AS NEEDED FOR UP TO 5 DAYS Patient taking differently: Take 50 mg by mouth every 8 (eight) hours as needed for moderate pain.  03/29/19  Yes LowRoma Schanz DO  zolpidem (AMBIEN) 5 MG tablet Take 2 tablets (10 mg total) by mouth at bedtime. 05/02/17  Yes Johnson, Clanford L, MD  blood glucose meter kit and supplies KIT Dispense based on patient and insurance preference. Use up to four times daily as directed.  (FOR ICD-9 250.00, 250.01). 10/04/18   LowCarollee HertervoAlferd ApaO  enoxaparin (LOVENOX) 150 MG/ML injection Inject 0.93 mLs (140 mg total) into the skin every 12 (twelve) hours for 10 days. Patient not taking: Reported on 04/02/2019 03/12/19 03/22/19  EzeAlma FriendlyD  glipiZIDE (GLUCOTROL) 5 MG tablet Take 0.5 tablets (2.5 mg total) by mouth 2 (two) times daily before a meal. Patient not taking: Reported on 04/02/2019 08/18/18 08/18/19  DanEdwin DadaD  Insulin Syringe-Needle U-100 28G X 5/16" 0.5 ML MISC Use with lantus once a day 12/06/18   LowCarollee HertervoAlferd ApaO  methocarbamol (ROBAXIN) 500 MG tablet Take 1 tablet (500  mg total) by mouth 4 (four) times daily. Patient not taking: Reported on 04/02/2019 03/14/19   Roma Schanz R, DO  warfarin (COUMADIN) 5 MG tablet Take 1.5 tablets (7.5 mg total) by mouth daily at 6 PM. Take 1.5 tabs daily every evening until further instruction from your PCP pending your blood work Patient taking differently: Take 5-7.5 mg by mouth See admin instructions. alternating between 23m and 7.536m6/30/20 04/11/19  EzAlma FriendlyMD    Family History Family History  Problem Relation Age of Onset  . Heart disease Father        MVP and Pics Valve  . Hypertension Father   . Depression Father        Institutionalized x's 2 years  . Bipolar disorder Father   . Hypertension Sister   . Diabetes Sister   . Hyperlipidemia Sister   . Heart disease Sister 529     MI  . Heart disease Brother   . Hypertension Brother   . Heart disease Paternal Grandmother   . Heart disease Paternal Aunt   . Heart disease Paternal Uncle   . Schizophrenia Paternal Aunt   . Asthma Son   . Asthma Son     Social History Social History   Tobacco Use  . Smoking status: Former Smoker    Packs/day: 1.00    Years: 40.00    Pack years: 40.00    Types: Cigarettes    Start date: 07/21/1972    Quit date: 10/16/2011    Years since quitting: 7.4  . Smokeless  tobacco: Never Used  Substance Use Topics  . Alcohol use: Not Currently    Comment: Occ-- Wine  . Drug use: No     Allergies   Norvasc [amlodipine besylate]   Review of Systems Review of Systems  Musculoskeletal:       Leg pain and swelling  All other systems reviewed and are negative.    Physical Exam Updated Vital Signs BP (!) 151/89 (BP Location: Right Wrist)   Pulse 86   Temp (!) 97.3 F (36.3 C) (Oral)   Resp 18   Ht '5\' 3"'  (1.6 m)   Wt 135.2 kg   SpO2 96%   BMI 52.79 kg/m   Physical Exam Vitals signs and nursing note reviewed.  Constitutional:      Appearance: Normal appearance. She is obese.  HENT:     Head: Normocephalic and atraumatic.     Right Ear: External ear normal.     Left Ear: External ear normal.     Nose: Nose normal.     Mouth/Throat:     Mouth: Mucous membranes are moist.     Pharynx: Oropharynx is clear.  Eyes:     Extraocular Movements: Extraocular movements intact.     Conjunctiva/sclera: Conjunctivae normal.     Pupils: Pupils are equal, round, and reactive to light.  Neck:     Musculoskeletal: Normal range of motion and neck supple.  Cardiovascular:     Rate and Rhythm: Normal rate and regular rhythm.     Pulses: Normal pulses.     Heart sounds: Normal heart sounds.  Pulmonary:     Effort: Pulmonary effort is normal.     Breath sounds: Normal breath sounds.  Abdominal:     General: Abdomen is flat. Bowel sounds are normal.     Palpations: Abdomen is soft.  Musculoskeletal:     Right lower leg: Edema present.     Left lower  leg: Edema present.  Skin:    General: Skin is warm.     Capillary Refill: Capillary refill takes less than 2 seconds.  Neurological:     General: No focal deficit present.     Mental Status: She is alert and oriented to person, place, and time.  Psychiatric:        Mood and Affect: Mood normal.        Behavior: Behavior normal.      ED Treatments / Results  Labs (all labs ordered are listed,  but only abnormal results are displayed) Labs Reviewed  CBC WITH DIFFERENTIAL/PLATELET - Abnormal; Notable for the following components:      Result Value   RBC 3.43 (*)    Hemoglobin 10.3 (*)    HCT 35.1 (*)    MCV 102.3 (*)    MCHC 29.3 (*)    All other components within normal limits  COMPREHENSIVE METABOLIC PANEL - Abnormal; Notable for the following components:   Glucose, Bld 129 (*)    Creatinine, Ser 1.79 (*)    Calcium 8.5 (*)    Total Protein 8.3 (*)    Albumin 3.3 (*)    GFR calc non Af Amer 29 (*)    GFR calc Af Amer 34 (*)    All other components within normal limits  PROTIME-INR - Abnormal; Notable for the following components:   Prothrombin Time 65.1 (*)    INR 7.9 (*)    All other components within normal limits    EKG None  Radiology Vas [place] Lower Extremity Venous (dvt) (only [facility])  Result Date: 04/02/2019  Lower Venous Study Indications: Edema.  Risk Factors: DVT History of left popliteal DVT 03/10/2019. Limitations: Body habitus. Comparison Study: 03/10/2019 Rt negative, Lt popliteal DVT Performing Technologist: Toma Copier RVS  Examination Guidelines: A complete evaluation includes B-mode imaging, spectral Doppler, color Doppler, and power Doppler as needed of all accessible portions of each vessel. Bilateral testing is considered an integral part of a complete examination. Limited examinations for reoccurring indications may be performed as noted.  +---------+---------------+---------+-----------+----------+-------------------+ RIGHT    CompressibilityPhasicitySpontaneityPropertiesSummary             +---------+---------------+---------+-----------+----------+-------------------+ CFV      Full           Yes      Yes                                      +---------+---------------+---------+-----------+----------+-------------------+ SFJ      Full                                                              +---------+---------------+---------+-----------+----------+-------------------+ FV Prox  Full           Yes      Yes                                      +---------+---------------+---------+-----------+----------+-------------------+ FV Mid   Full                                                             +---------+---------------+---------+-----------+----------+-------------------+  FV DistalFull           Yes      Yes                                      +---------+---------------+---------+-----------+----------+-------------------+ PFV      Full           Yes      Yes                                      +---------+---------------+---------+-----------+----------+-------------------+ POP      Full           Yes      Yes                                      +---------+---------------+---------+-----------+----------+-------------------+ PTV      Full                                                             +---------+---------------+---------+-----------+----------+-------------------+ PERO                                                  Only able to                                                              visualize on                                                              augmentation. Color                                                       flow appears normal                                                       at that time        +---------+---------------+---------+-----------+----------+-------------------+   +---------+---------------+---------+-----------+----------+-------------------+ LEFT     CompressibilityPhasicitySpontaneityPropertiesSummary             +---------+---------------+---------+-----------+----------+-------------------+ CFV      Full           Yes      Yes                                      +---------+---------------+---------+-----------+----------+-------------------+  SFJ      Full                                                             +---------+---------------+---------+-----------+----------+-------------------+ FV Prox  Full           Yes      Yes                                      +---------+---------------+---------+-----------+----------+-------------------+ FV Mid   Full                                                             +---------+---------------+---------+-----------+----------+-------------------+ FV Distal               Yes      Yes                  Difficult to                                                              visualize due to                                                          body habitus        +---------+---------------+---------+-----------+----------+-------------------+ PFV      Full           Yes      Yes                                      +---------+---------------+---------+-----------+----------+-------------------+ POP      Full           Yes      Yes                                      +---------+---------------+---------+-----------+----------+-------------------+ PTV      Full                                         Difficult to  visualize           +---------+---------------+---------+-----------+----------+-------------------+ PERO                                                  Only able to                                                              visualize with                                                            augmentation. Color                                                       flow appears normal                                                       at that time\       +---------+---------------+---------+-----------+----------+-------------------+   Left Technical Findings: Previous DVT noted in the popliteal vein appears to have  resolved.   Summary: Right: There is no evidence of deep vein thrombosis in the lower extremity. No cystic structure found in the popliteal fossa. See technical summary notes listed above Left: There is no evidence of deep vein thrombosis in the lower extremity. No cystic structure found in the popliteal fossa. See summary notes listed above. See technical findings listed above.  *See table(s) above for measurements and observations.    Preliminary    Vas [place] Upper Extremity Venous Duplex  Result Date: 04/02/2019 UPPER VENOUS STUDY  Indications: Edema Limitations: Body habitus. Performing Technologist: Toma Copier RVS  Examination Guidelines: A complete evaluation includes B-mode imaging, spectral Doppler, color Doppler, and power Doppler as needed of all accessible portions of each vessel. Bilateral testing is considered an integral part of a complete examination. Limited examinations for reoccurring indications may be performed as noted.  Right Findings: +----------+------------+---------+-----------+----------+-------+ RIGHT     CompressiblePhasicitySpontaneousPropertiesSummary +----------+------------+---------+-----------+----------+-------+ IJV           Full       Yes       Yes                      +----------+------------+---------+-----------+----------+-------+ Subclavian    Full       Yes       Yes                      +----------+------------+---------+-----------+----------+-------+ Axillary      Full       Yes       Yes                      +----------+------------+---------+-----------+----------+-------+  Brachial      Full       Yes       Yes                      +----------+------------+---------+-----------+----------+-------+ Radial        Full       Yes       Yes                      +----------+------------+---------+-----------+----------+-------+ Ulnar         Full                                           +----------+------------+---------+-----------+----------+-------+ Cephalic      Full                                          +----------+------------+---------+-----------+----------+-------+ Basilic       Full                                          +----------+------------+---------+-----------+----------+-------+  Left Findings: +----------+------------+---------+-----------+----------+-------+ LEFT      CompressiblePhasicitySpontaneousPropertiesSummary +----------+------------+---------+-----------+----------+-------+ Subclavian    Full       Yes       Yes                      +----------+------------+---------+-----------+----------+-------+  Summary:  Right: No evidence of deep vein thrombosis in the upper extremity. No evidence of superficial vein thrombosis in the upper extremity. No evidence of thrombosis in the subclavian.  Left: No evidence of thrombosis in the subclavian.  *See table(s) above for measurements and observations.    Preliminary     Procedures Procedures (including critical care time)  Medications Ordered in ED Medications  phytonadione (VITAMIN K) tablet 2.5 mg (has no administration in time range)     Initial Impression / Assessment and Plan / ED Course  I have reviewed the triage vital signs and the nursing notes.  Pertinent labs & imaging results that were available during my care of the patient were reviewed by me and considered in my medical decision making (see chart for details).       Pt has been on and off coumadin since she was diagnosed with a DVT.  I did an [place] of both legs and her right arm (all painful) and she was negative for DVT.  As the clot is not worsening, I gave her 1 low dose of vitamin k (2.5 mg) orally prior to d/c.  She is told to hold the lovenox and coumadin until she gets her INR rechecked.  Return if worse.  F/u with pcp.  Final Clinical Impressions(s) / ED Diagnoses   Final diagnoses:  Elevated INR    ED  Discharge Orders    None       Isla Pence, MD 04/02/19 [phone]

## 2019-04-02 NOTE — ED Notes (Signed)
Patient given ice chips with MD permission.

## 2019-04-02 NOTE — Telephone Encounter (Signed)
Sue [name] called to report INR- >8. Pt reported that she has not taken any extra doses or missed any doses-she even held yesterdays dose. Spoke w/ Dr. Etter Sjogren- Pt needs to go to ED. Spoke w/ Pt- informed to go to ED. Pt verbalized understanding.

## 2019-04-02 NOTE — Progress Notes (Signed)
Bilateral lower extremity venous duplex completed. Preliminary results in Chart review CV Proc. [place] Shequita Peplinski,RVS  04/02/2019, 5:25 PM

## 2019-04-02 NOTE — Progress Notes (Signed)
Right upper extremity venous duplex completed. Preliminary results in Chart review CV Proc. [place] Daphanie Oquendo,RVS 04/02/2019, 5:15 PM

## 2019-04-02 NOTE — ED Notes (Signed)
ED Provider at bedside. 

## 2019-04-02 NOTE — Discharge Instructions (Addendum)
Do not take lovenox or coumadin.  Eat green, leafy vegetables.

## 2019-04-03 ENCOUNTER — Other Ambulatory Visit: Payer: Self-pay | Admitting: Nurse Practitioner

## 2019-04-04 ENCOUNTER — Telehealth: Payer: Self-pay

## 2019-04-04 ENCOUNTER — Ambulatory Visit (INDEPENDENT_AMBULATORY_CARE_PROVIDER_SITE_OTHER): Payer: Medicare HMO | Admitting: Family Medicine

## 2019-04-04 ENCOUNTER — Encounter: Payer: Self-pay | Admitting: Family Medicine

## 2019-04-04 ENCOUNTER — Other Ambulatory Visit: Payer: Self-pay

## 2019-04-04 VITALS — BP 145/80 | HR 81 | Temp 97.7°F | Wt 298.0 lb

## 2019-04-04 DIAGNOSIS — R600 Localized edema: Secondary | ICD-10-CM

## 2019-04-04 DIAGNOSIS — I82409 Acute embolism and thrombosis of unspecified deep veins of unspecified lower extremity: Secondary | ICD-10-CM

## 2019-04-04 MED ORDER — TORSEMIDE 20 MG PO TABS: 40.0000 mg | ORAL_TABLET | Freq: Every day | ORAL | 1 refills | Status: DC

## 2019-04-04 MED ORDER — NONFORMULARY OR COMPOUNDED ITEM: 0 refills | Status: DC

## 2019-04-04 NOTE — Telephone Encounter (Signed)
Rx faxed for an INR check sent to [facility]. Confirmation recieved

## 2019-04-04 NOTE — Progress Notes (Signed)
Virtual Visit via Video Note  I connected with Sue [name] on 04/04/19 at  3:00 PM EDT by a video enabled telemedicine application and verified that I am speaking with the correct person using two identifiers.  Location: Patient: home  Provider: office   I discussed the limitations of evaluation and management by telemedicine and the availability of in person appointments. The patient expressed understanding and agreed to proceed.  History of Present Illness: Pt is home ---  She went to the er earlier this week---- her INR was 8---- it went up after dec coumadin and stopping lovenox Coumadin and lovenox were stopped  Pt has no complaints --- no bleeding    Observations/Objective: Vitals:   04/04/19 1522  BP: (!) 145/80  Pulse: 81  Temp: 97.7 F (36.5 C)  SpO2: 96%   Pt is in NAD Assessment and Plan: 1. Lower extremity edema Refill med Stable per pt  - torsemide (DEMADEX) 20 MG tablet; Take 2 tablets (40 mg total) by mouth daily.  Dispense: 180 tablet; Refill: 1  2. Deep vein thrombosis (DVT) of lower extremity, unspecified chronicity, unspecified laterality, unspecified vein (HCC) Elevate legs Pt is off lovenox and coumadin  Home health to check pt inr tomorrow  - NONFORMULARY OR COMPOUNDED ITEM; Pt/ inr   Dx dvt   Tomorrow 04/05/2019  Please call office [phone] with results  Dispense: 1 each; Refill: 0'  Follow Up Instructions:    I discussed the assessment and treatment plan with the patient. The patient was provided an opportunity to ask questions and all were answered. The patient agreed with the plan and demonstrated an understanding of the instructions.   The patient was advised to call back or seek an in-person evaluation if the symptoms worsen or if the condition fails to improve as anticipated.  I provided 20 minutes of non-face-to-face time during this encounter.   Ann Held, DO

## 2019-04-04 NOTE — Assessment & Plan Note (Signed)
Pt is off lovenox and coumadin  Home health to check pt inr tomorrow

## 2019-04-05 ENCOUNTER — Other Ambulatory Visit: Payer: Self-pay

## 2019-04-05 ENCOUNTER — Telehealth: Payer: Self-pay | Admitting: Family Medicine

## 2019-04-05 NOTE — Telephone Encounter (Signed)
Spoke with pt --- home health never came --- I verified with cma that order for INR and pt was faxed and we received verification of receipt Pt will start coumadin 2.5 mg daily and she will come in Monday for PT Inr check

## 2019-04-08 ENCOUNTER — Other Ambulatory Visit: Payer: Self-pay

## 2019-04-08 ENCOUNTER — Other Ambulatory Visit: Payer: Self-pay | Admitting: Family Medicine

## 2019-04-08 ENCOUNTER — Other Ambulatory Visit: Payer: Self-pay | Admitting: Nurse Practitioner

## 2019-04-08 ENCOUNTER — Telehealth: Payer: Self-pay

## 2019-04-08 ENCOUNTER — Ambulatory Visit (INDEPENDENT_AMBULATORY_CARE_PROVIDER_SITE_OTHER): Payer: Medicare HMO

## 2019-04-08 DIAGNOSIS — Z7901 Long term (current) use of anticoagulants: Secondary | ICD-10-CM | POA: Diagnosis not present

## 2019-04-08 DIAGNOSIS — S82142D Displaced bicondylar fracture of left tibia, subsequent encounter for closed fracture with routine healing: Secondary | ICD-10-CM | POA: Diagnosis not present

## 2019-04-08 DIAGNOSIS — S83242D Other tear of medial meniscus, current injury, left knee, subsequent encounter: Secondary | ICD-10-CM | POA: Diagnosis not present

## 2019-04-08 DIAGNOSIS — W0110XD Fall on same level from slipping, tripping and stumbling with subsequent striking against unspecified object, subsequent encounter: Secondary | ICD-10-CM | POA: Diagnosis not present

## 2019-04-08 DIAGNOSIS — E1122 Type 2 diabetes mellitus with diabetic chronic kidney disease: Secondary | ICD-10-CM | POA: Diagnosis not present

## 2019-04-08 DIAGNOSIS — S92342D Displaced fracture of fourth metatarsal bone, left foot, subsequent encounter for fracture with routine healing: Secondary | ICD-10-CM | POA: Diagnosis not present

## 2019-04-08 DIAGNOSIS — S92332D Displaced fracture of third metatarsal bone, left foot, subsequent encounter for fracture with routine healing: Secondary | ICD-10-CM | POA: Diagnosis not present

## 2019-04-08 DIAGNOSIS — S89202D Unspecified physeal fracture of upper end of left fibula, subsequent encounter for fracture with routine healing: Secondary | ICD-10-CM | POA: Diagnosis not present

## 2019-04-08 DIAGNOSIS — R600 Localized edema: Secondary | ICD-10-CM

## 2019-04-08 DIAGNOSIS — K56609 Unspecified intestinal obstruction, unspecified as to partial versus complete obstruction: Secondary | ICD-10-CM | POA: Diagnosis not present

## 2019-04-08 DIAGNOSIS — I824Y9 Acute embolism and thrombosis of unspecified deep veins of unspecified proximal lower extremity: Secondary | ICD-10-CM

## 2019-04-08 DIAGNOSIS — I129 Hypertensive chronic kidney disease with stage 1 through stage 4 chronic kidney disease, or unspecified chronic kidney disease: Secondary | ICD-10-CM | POA: Diagnosis not present

## 2019-04-08 LAB — POCT INR: INR: 2.3 (ref 2.0–3.0)

## 2019-04-08 MED ORDER — NONFORMULARY OR COMPOUNDED ITEM: 0 refills | Status: DC

## 2019-04-08 NOTE — Telephone Encounter (Signed)
Copied from [place] [id number]. Topic: General - Other >> Apr 05, 2019  3:22 PM Yvette Rack wrote: Reason for CRM: Sharyn Lull with [facility] stated pt told her that she needed to have labs drawn but they have not received an order. Sharyn Lull provided her call back # [phone] and stated she has a secure voicemail. Sharyn Lull also stated they are short staffed so the earliest that they would be able to collect the labs would be on Monday.

## 2019-04-08 NOTE — Telephone Encounter (Signed)
Printed

## 2019-04-08 NOTE — Progress Notes (Signed)
Pt here for INR check per  Goal INR =  Last INR =7.9 on 04/01/2009  Pt currently takes Coumadin 2.5 mg daily since 04/05/19  Pt denies recent antibiotics, no dietary changes and no unusual bruising / bleeding.  INR today = 2.3  Pt advised per Dr. Etter Sjogren ok to continue same dose and return next week for INR check.

## 2019-04-08 NOTE — Telephone Encounter (Signed)
Left message with verbal.

## 2019-04-08 NOTE — Telephone Encounter (Signed)
Could you Rx the orders and I will fax them?

## 2019-04-09 DIAGNOSIS — S89202D Unspecified physeal fracture of upper end of left fibula, subsequent encounter for fracture with routine healing: Secondary | ICD-10-CM | POA: Diagnosis not present

## 2019-04-09 DIAGNOSIS — S83242D Other tear of medial meniscus, current injury, left knee, subsequent encounter: Secondary | ICD-10-CM | POA: Diagnosis not present

## 2019-04-09 DIAGNOSIS — W0110XD Fall on same level from slipping, tripping and stumbling with subsequent striking against unspecified object, subsequent encounter: Secondary | ICD-10-CM | POA: Diagnosis not present

## 2019-04-09 DIAGNOSIS — E1122 Type 2 diabetes mellitus with diabetic chronic kidney disease: Secondary | ICD-10-CM | POA: Diagnosis not present

## 2019-04-09 DIAGNOSIS — I129 Hypertensive chronic kidney disease with stage 1 through stage 4 chronic kidney disease, or unspecified chronic kidney disease: Secondary | ICD-10-CM | POA: Diagnosis not present

## 2019-04-09 DIAGNOSIS — S92332D Displaced fracture of third metatarsal bone, left foot, subsequent encounter for fracture with routine healing: Secondary | ICD-10-CM | POA: Diagnosis not present

## 2019-04-09 DIAGNOSIS — S92342D Displaced fracture of fourth metatarsal bone, left foot, subsequent encounter for fracture with routine healing: Secondary | ICD-10-CM | POA: Diagnosis not present

## 2019-04-09 DIAGNOSIS — S82142D Displaced bicondylar fracture of left tibia, subsequent encounter for closed fracture with routine healing: Secondary | ICD-10-CM | POA: Diagnosis not present

## 2019-04-09 DIAGNOSIS — K56609 Unspecified intestinal obstruction, unspecified as to partial versus complete obstruction: Secondary | ICD-10-CM | POA: Diagnosis not present

## 2019-04-10 DIAGNOSIS — W0110XD Fall on same level from slipping, tripping and stumbling with subsequent striking against unspecified object, subsequent encounter: Secondary | ICD-10-CM | POA: Diagnosis not present

## 2019-04-10 DIAGNOSIS — S89202D Unspecified physeal fracture of upper end of left fibula, subsequent encounter for fracture with routine healing: Secondary | ICD-10-CM | POA: Diagnosis not present

## 2019-04-10 DIAGNOSIS — S92342D Displaced fracture of fourth metatarsal bone, left foot, subsequent encounter for fracture with routine healing: Secondary | ICD-10-CM | POA: Diagnosis not present

## 2019-04-10 DIAGNOSIS — I129 Hypertensive chronic kidney disease with stage 1 through stage 4 chronic kidney disease, or unspecified chronic kidney disease: Secondary | ICD-10-CM | POA: Diagnosis not present

## 2019-04-10 DIAGNOSIS — K56609 Unspecified intestinal obstruction, unspecified as to partial versus complete obstruction: Secondary | ICD-10-CM | POA: Diagnosis not present

## 2019-04-10 DIAGNOSIS — S82142D Displaced bicondylar fracture of left tibia, subsequent encounter for closed fracture with routine healing: Secondary | ICD-10-CM | POA: Diagnosis not present

## 2019-04-10 DIAGNOSIS — E1122 Type 2 diabetes mellitus with diabetic chronic kidney disease: Secondary | ICD-10-CM | POA: Diagnosis not present

## 2019-04-10 DIAGNOSIS — S92332D Displaced fracture of third metatarsal bone, left foot, subsequent encounter for fracture with routine healing: Secondary | ICD-10-CM | POA: Diagnosis not present

## 2019-04-10 DIAGNOSIS — S83242D Other tear of medial meniscus, current injury, left knee, subsequent encounter: Secondary | ICD-10-CM | POA: Diagnosis not present

## 2019-04-12 DIAGNOSIS — J9601 Acute respiratory failure with hypoxia: Secondary | ICD-10-CM | POA: Diagnosis not present

## 2019-04-12 DIAGNOSIS — J449 Chronic obstructive pulmonary disease, unspecified: Secondary | ICD-10-CM | POA: Diagnosis not present

## 2019-04-15 ENCOUNTER — Telehealth: Payer: Self-pay | Admitting: *Deleted

## 2019-04-15 ENCOUNTER — Other Ambulatory Visit: Payer: Self-pay | Admitting: Family Medicine

## 2019-04-15 DIAGNOSIS — S92342D Displaced fracture of fourth metatarsal bone, left foot, subsequent encounter for fracture with routine healing: Secondary | ICD-10-CM | POA: Diagnosis not present

## 2019-04-15 DIAGNOSIS — S92332D Displaced fracture of third metatarsal bone, left foot, subsequent encounter for fracture with routine healing: Secondary | ICD-10-CM | POA: Diagnosis not present

## 2019-04-15 DIAGNOSIS — S83242D Other tear of medial meniscus, current injury, left knee, subsequent encounter: Secondary | ICD-10-CM | POA: Diagnosis not present

## 2019-04-15 DIAGNOSIS — S82142D Displaced bicondylar fracture of left tibia, subsequent encounter for closed fracture with routine healing: Secondary | ICD-10-CM | POA: Diagnosis not present

## 2019-04-15 DIAGNOSIS — I129 Hypertensive chronic kidney disease with stage 1 through stage 4 chronic kidney disease, or unspecified chronic kidney disease: Secondary | ICD-10-CM | POA: Diagnosis not present

## 2019-04-15 DIAGNOSIS — S89202D Unspecified physeal fracture of upper end of left fibula, subsequent encounter for fracture with routine healing: Secondary | ICD-10-CM | POA: Diagnosis not present

## 2019-04-15 DIAGNOSIS — E1122 Type 2 diabetes mellitus with diabetic chronic kidney disease: Secondary | ICD-10-CM | POA: Diagnosis not present

## 2019-04-15 DIAGNOSIS — I824Y3 Acute embolism and thrombosis of unspecified deep veins of proximal lower extremity, bilateral: Secondary | ICD-10-CM

## 2019-04-15 DIAGNOSIS — W0110XD Fall on same level from slipping, tripping and stumbling with subsequent striking against unspecified object, subsequent encounter: Secondary | ICD-10-CM | POA: Diagnosis not present

## 2019-04-15 DIAGNOSIS — K56609 Unspecified intestinal obstruction, unspecified as to partial versus complete obstruction: Secondary | ICD-10-CM | POA: Diagnosis not present

## 2019-04-15 NOTE — Telephone Encounter (Signed)
Michelle notified of change.

## 2019-04-15 NOTE — Telephone Encounter (Signed)
Requesting: Tramadol Contract: N/A UDS: N/A Last OV: 04/04/2019 Next OV: N/A Last Refill: 03/29/2019, #30--0 RF Database:   Please advise

## 2019-04-15 NOTE — Telephone Encounter (Signed)
If she is still taking 2.5 mg daily ----  Take 5 mg Mon and Wed and 2.5 all other days  Recheck Thursday or Friday

## 2019-04-15 NOTE — Telephone Encounter (Signed)
We need contract and uds on her

## 2019-04-15 NOTE — Telephone Encounter (Signed)
Copied from [place] [id number]. Topic: General - Other >> Apr 15, 2019  9:53 AM Virl Axe D wrote: Reason for CRM: Sharyn Lull with [facility] called to give pt's PT and INR. INR result was 1.6 and PT was 19.6. Requesting callback to let her know when she should do a recheck and if there needs to be a dosage change. CY#[phone]

## 2019-04-16 ENCOUNTER — Other Ambulatory Visit: Payer: Self-pay | Admitting: *Deleted

## 2019-04-16 DIAGNOSIS — E1122 Type 2 diabetes mellitus with diabetic chronic kidney disease: Secondary | ICD-10-CM | POA: Diagnosis not present

## 2019-04-16 DIAGNOSIS — I129 Hypertensive chronic kidney disease with stage 1 through stage 4 chronic kidney disease, or unspecified chronic kidney disease: Secondary | ICD-10-CM | POA: Diagnosis not present

## 2019-04-16 DIAGNOSIS — S83242D Other tear of medial meniscus, current injury, left knee, subsequent encounter: Secondary | ICD-10-CM | POA: Diagnosis not present

## 2019-04-16 DIAGNOSIS — K56609 Unspecified intestinal obstruction, unspecified as to partial versus complete obstruction: Secondary | ICD-10-CM | POA: Diagnosis not present

## 2019-04-16 DIAGNOSIS — S92342D Displaced fracture of fourth metatarsal bone, left foot, subsequent encounter for fracture with routine healing: Secondary | ICD-10-CM | POA: Diagnosis not present

## 2019-04-16 DIAGNOSIS — W0110XD Fall on same level from slipping, tripping and stumbling with subsequent striking against unspecified object, subsequent encounter: Secondary | ICD-10-CM | POA: Diagnosis not present

## 2019-04-16 DIAGNOSIS — S92332D Displaced fracture of third metatarsal bone, left foot, subsequent encounter for fracture with routine healing: Secondary | ICD-10-CM | POA: Diagnosis not present

## 2019-04-16 DIAGNOSIS — S82142D Displaced bicondylar fracture of left tibia, subsequent encounter for closed fracture with routine healing: Secondary | ICD-10-CM | POA: Diagnosis not present

## 2019-04-16 DIAGNOSIS — S89202D Unspecified physeal fracture of upper end of left fibula, subsequent encounter for fracture with routine healing: Secondary | ICD-10-CM | POA: Diagnosis not present

## 2019-04-16 NOTE — Progress Notes (Signed)
Error

## 2019-04-16 NOTE — Telephone Encounter (Signed)
Pt is calling checking on tramadol refill  °

## 2019-04-16 NOTE — Telephone Encounter (Signed)
Error

## 2019-04-16 NOTE — Telephone Encounter (Signed)
Patient has an appointment on 04/30/19 with Dr Delfino Lovett and at that time she will ask them to take over the refills.  Can we just refill one more time for her.  It is a little hard for her to get around.

## 2019-04-16 NOTE — Telephone Encounter (Signed)
Patient notified that medication was sent in.

## 2019-04-17 DIAGNOSIS — K56609 Unspecified intestinal obstruction, unspecified as to partial versus complete obstruction: Secondary | ICD-10-CM | POA: Diagnosis not present

## 2019-04-17 DIAGNOSIS — S92332D Displaced fracture of third metatarsal bone, left foot, subsequent encounter for fracture with routine healing: Secondary | ICD-10-CM | POA: Diagnosis not present

## 2019-04-17 DIAGNOSIS — E1122 Type 2 diabetes mellitus with diabetic chronic kidney disease: Secondary | ICD-10-CM | POA: Diagnosis not present

## 2019-04-17 DIAGNOSIS — S92342D Displaced fracture of fourth metatarsal bone, left foot, subsequent encounter for fracture with routine healing: Secondary | ICD-10-CM | POA: Diagnosis not present

## 2019-04-17 DIAGNOSIS — S82142D Displaced bicondylar fracture of left tibia, subsequent encounter for closed fracture with routine healing: Secondary | ICD-10-CM | POA: Diagnosis not present

## 2019-04-17 DIAGNOSIS — W0110XD Fall on same level from slipping, tripping and stumbling with subsequent striking against unspecified object, subsequent encounter: Secondary | ICD-10-CM | POA: Diagnosis not present

## 2019-04-17 DIAGNOSIS — S83242D Other tear of medial meniscus, current injury, left knee, subsequent encounter: Secondary | ICD-10-CM | POA: Diagnosis not present

## 2019-04-17 DIAGNOSIS — I129 Hypertensive chronic kidney disease with stage 1 through stage 4 chronic kidney disease, or unspecified chronic kidney disease: Secondary | ICD-10-CM | POA: Diagnosis not present

## 2019-04-17 DIAGNOSIS — S89202D Unspecified physeal fracture of upper end of left fibula, subsequent encounter for fracture with routine healing: Secondary | ICD-10-CM | POA: Diagnosis not present

## 2019-04-19 ENCOUNTER — Telehealth: Payer: Self-pay

## 2019-04-19 DIAGNOSIS — S92342D Displaced fracture of fourth metatarsal bone, left foot, subsequent encounter for fracture with routine healing: Secondary | ICD-10-CM | POA: Diagnosis not present

## 2019-04-19 DIAGNOSIS — S89202D Unspecified physeal fracture of upper end of left fibula, subsequent encounter for fracture with routine healing: Secondary | ICD-10-CM | POA: Diagnosis not present

## 2019-04-19 DIAGNOSIS — S92332D Displaced fracture of third metatarsal bone, left foot, subsequent encounter for fracture with routine healing: Secondary | ICD-10-CM | POA: Diagnosis not present

## 2019-04-19 DIAGNOSIS — W0110XD Fall on same level from slipping, tripping and stumbling with subsequent striking against unspecified object, subsequent encounter: Secondary | ICD-10-CM | POA: Diagnosis not present

## 2019-04-19 DIAGNOSIS — S82142D Displaced bicondylar fracture of left tibia, subsequent encounter for closed fracture with routine healing: Secondary | ICD-10-CM | POA: Diagnosis not present

## 2019-04-19 DIAGNOSIS — I129 Hypertensive chronic kidney disease with stage 1 through stage 4 chronic kidney disease, or unspecified chronic kidney disease: Secondary | ICD-10-CM | POA: Diagnosis not present

## 2019-04-19 DIAGNOSIS — K56609 Unspecified intestinal obstruction, unspecified as to partial versus complete obstruction: Secondary | ICD-10-CM | POA: Diagnosis not present

## 2019-04-19 DIAGNOSIS — S83242D Other tear of medial meniscus, current injury, left knee, subsequent encounter: Secondary | ICD-10-CM | POA: Diagnosis not present

## 2019-04-19 DIAGNOSIS — E1122 Type 2 diabetes mellitus with diabetic chronic kidney disease: Secondary | ICD-10-CM | POA: Diagnosis not present

## 2019-04-19 NOTE — Telephone Encounter (Signed)
Received message stating the INR was 2.5 from [place] at [facility]. Per Dr. Etter Sjogren keep patient at current dosage and recheck INR in 1 week. Left message on Michelle's VM

## 2019-04-20 DIAGNOSIS — J9601 Acute respiratory failure with hypoxia: Secondary | ICD-10-CM | POA: Diagnosis not present

## 2019-04-20 DIAGNOSIS — J449 Chronic obstructive pulmonary disease, unspecified: Secondary | ICD-10-CM | POA: Diagnosis not present

## 2019-04-20 DIAGNOSIS — J439 Emphysema, unspecified: Secondary | ICD-10-CM | POA: Diagnosis not present

## 2019-04-25 ENCOUNTER — Telehealth: Payer: Self-pay | Admitting: Family Medicine

## 2019-04-25 DIAGNOSIS — W0110XD Fall on same level from slipping, tripping and stumbling with subsequent striking against unspecified object, subsequent encounter: Secondary | ICD-10-CM | POA: Diagnosis not present

## 2019-04-25 DIAGNOSIS — S89202D Unspecified physeal fracture of upper end of left fibula, subsequent encounter for fracture with routine healing: Secondary | ICD-10-CM | POA: Diagnosis not present

## 2019-04-25 DIAGNOSIS — E1122 Type 2 diabetes mellitus with diabetic chronic kidney disease: Secondary | ICD-10-CM | POA: Diagnosis not present

## 2019-04-25 DIAGNOSIS — K56609 Unspecified intestinal obstruction, unspecified as to partial versus complete obstruction: Secondary | ICD-10-CM | POA: Diagnosis not present

## 2019-04-25 DIAGNOSIS — S83242D Other tear of medial meniscus, current injury, left knee, subsequent encounter: Secondary | ICD-10-CM | POA: Diagnosis not present

## 2019-04-25 DIAGNOSIS — S82142D Displaced bicondylar fracture of left tibia, subsequent encounter for closed fracture with routine healing: Secondary | ICD-10-CM | POA: Diagnosis not present

## 2019-04-25 DIAGNOSIS — S92342D Displaced fracture of fourth metatarsal bone, left foot, subsequent encounter for fracture with routine healing: Secondary | ICD-10-CM | POA: Diagnosis not present

## 2019-04-25 DIAGNOSIS — S92332D Displaced fracture of third metatarsal bone, left foot, subsequent encounter for fracture with routine healing: Secondary | ICD-10-CM | POA: Diagnosis not present

## 2019-04-25 DIAGNOSIS — I129 Hypertensive chronic kidney disease with stage 1 through stage 4 chronic kidney disease, or unspecified chronic kidney disease: Secondary | ICD-10-CM | POA: Diagnosis not present

## 2019-04-25 NOTE — Telephone Encounter (Signed)
Spoke with Pt regarding concerns that were called in from [place] from [facility]. Pt states feeling better and Sx have resolved on her own and sugars have gone back to normal. Advised patient to contact us if sx persist.

## 2019-04-25 NOTE — Telephone Encounter (Signed)
Ok to give verbal Is pt taking all dm meds? Any sign uti or other infection?   Fever? May need covid test ---- or er visit

## 2019-04-25 NOTE — Telephone Encounter (Signed)
Copied from [place] [id number]. Topic: General - Other >> Apr 25, 2019  9:16 AM Keene Breath wrote: Reason for CRM: Clair Gulling, PT with [facility] called to update the doctor on patient's condition.  For the past 2-3 days patient has been worse, with dizziness, fuzzy, diarrhea, blood sugar 230-250, BP normal.  Would like to request additional visits:  1 visit next wk, 1wk 9 to continue.  Please advise and call back with any questions CB# [phone]

## 2019-04-25 NOTE — Telephone Encounter (Signed)
Left VM with Clair Gulling. Asked Dr. Nonda Lou questions and concern for COVID testing or an ED visit.

## 2019-04-25 NOTE — Telephone Encounter (Signed)
Please advise 

## 2019-04-26 ENCOUNTER — Telehealth: Payer: Self-pay | Admitting: Family Medicine

## 2019-04-26 DIAGNOSIS — S82142D Displaced bicondylar fracture of left tibia, subsequent encounter for closed fracture with routine healing: Secondary | ICD-10-CM | POA: Diagnosis not present

## 2019-04-26 DIAGNOSIS — I129 Hypertensive chronic kidney disease with stage 1 through stage 4 chronic kidney disease, or unspecified chronic kidney disease: Secondary | ICD-10-CM | POA: Diagnosis not present

## 2019-04-26 DIAGNOSIS — K56609 Unspecified intestinal obstruction, unspecified as to partial versus complete obstruction: Secondary | ICD-10-CM | POA: Diagnosis not present

## 2019-04-26 DIAGNOSIS — S92332D Displaced fracture of third metatarsal bone, left foot, subsequent encounter for fracture with routine healing: Secondary | ICD-10-CM | POA: Diagnosis not present

## 2019-04-26 DIAGNOSIS — S92342D Displaced fracture of fourth metatarsal bone, left foot, subsequent encounter for fracture with routine healing: Secondary | ICD-10-CM | POA: Diagnosis not present

## 2019-04-26 DIAGNOSIS — W0110XD Fall on same level from slipping, tripping and stumbling with subsequent striking against unspecified object, subsequent encounter: Secondary | ICD-10-CM | POA: Diagnosis not present

## 2019-04-26 DIAGNOSIS — E1122 Type 2 diabetes mellitus with diabetic chronic kidney disease: Secondary | ICD-10-CM | POA: Diagnosis not present

## 2019-04-26 DIAGNOSIS — S83242D Other tear of medial meniscus, current injury, left knee, subsequent encounter: Secondary | ICD-10-CM | POA: Diagnosis not present

## 2019-04-26 DIAGNOSIS — S89202D Unspecified physeal fracture of upper end of left fibula, subsequent encounter for fracture with routine healing: Secondary | ICD-10-CM | POA: Diagnosis not present

## 2019-04-26 NOTE — Telephone Encounter (Signed)
Take 2.5 everyday but Wed -- take 5 mg Hold dose today Recheck next week

## 2019-04-26 NOTE — Telephone Encounter (Signed)
Called Sharyn Lull to confirm dosage of coumadin per Etter Sjogren

## 2019-04-26 NOTE — Telephone Encounter (Signed)
I need to know what she is taking now as far as her coumadin dose

## 2019-04-26 NOTE — Telephone Encounter (Signed)
Please advise 

## 2019-04-26 NOTE — Telephone Encounter (Signed)
Sharyn Lull returned call and stated pt takes 5 mg on Monday and Wednesday and the other days of the week she takes 2.5 mg

## 2019-04-26 NOTE — Telephone Encounter (Signed)
See below. Please advise.  

## 2019-04-26 NOTE — Telephone Encounter (Signed)
Verbal given 

## 2019-04-26 NOTE — Telephone Encounter (Signed)
[facility] Lull with [facility] called in with [facility] results  INT today was 5.2 She stated that started back on her Metformin and Lantus  She needs a call back to see when see needs to check INR again?   Best number  [phone]

## 2019-04-28 DIAGNOSIS — J440 Chronic obstructive pulmonary disease with acute lower respiratory infection: Secondary | ICD-10-CM | POA: Diagnosis not present

## 2019-04-30 ENCOUNTER — Telehealth: Payer: Self-pay | Admitting: Family Medicine

## 2019-04-30 DIAGNOSIS — S82832D Other fracture of upper and lower end of left fibula, subsequent encounter for closed fracture with routine healing: Secondary | ICD-10-CM | POA: Diagnosis not present

## 2019-04-30 DIAGNOSIS — S92302D Fracture of unspecified metatarsal bone(s), left foot, subsequent encounter for fracture with routine healing: Secondary | ICD-10-CM | POA: Diagnosis not present

## 2019-04-30 NOTE — Telephone Encounter (Signed)
Caller: Sharyn Lull [facility] Requesting skilled nursing to check INR  Frequency: weekly for the next 9 weeks.

## 2019-04-30 NOTE — Telephone Encounter (Signed)
Verbal orders given  

## 2019-05-01 ENCOUNTER — Other Ambulatory Visit: Payer: Self-pay | Admitting: Family Medicine

## 2019-05-01 DIAGNOSIS — I10 Essential (primary) hypertension: Secondary | ICD-10-CM

## 2019-05-05 DIAGNOSIS — J439 Emphysema, unspecified: Secondary | ICD-10-CM | POA: Diagnosis not present

## 2019-05-05 DIAGNOSIS — E1122 Type 2 diabetes mellitus with diabetic chronic kidney disease: Secondary | ICD-10-CM | POA: Diagnosis not present

## 2019-05-05 DIAGNOSIS — S89302D Unspecified physeal fracture of lower end of left fibula, subsequent encounter for fracture with routine healing: Secondary | ICD-10-CM | POA: Diagnosis not present

## 2019-05-05 DIAGNOSIS — J9610 Chronic respiratory failure, unspecified whether with hypoxia or hypercapnia: Secondary | ICD-10-CM | POA: Diagnosis not present

## 2019-05-05 DIAGNOSIS — S83242D Other tear of medial meniscus, current injury, left knee, subsequent encounter: Secondary | ICD-10-CM | POA: Diagnosis not present

## 2019-05-05 DIAGNOSIS — S92342D Displaced fracture of fourth metatarsal bone, left foot, subsequent encounter for fracture with routine healing: Secondary | ICD-10-CM | POA: Diagnosis not present

## 2019-05-05 DIAGNOSIS — S82142D Displaced bicondylar fracture of left tibia, subsequent encounter for closed fracture with routine healing: Secondary | ICD-10-CM | POA: Diagnosis not present

## 2019-05-05 DIAGNOSIS — I129 Hypertensive chronic kidney disease with stage 1 through stage 4 chronic kidney disease, or unspecified chronic kidney disease: Secondary | ICD-10-CM | POA: Diagnosis not present

## 2019-05-05 DIAGNOSIS — S92332D Displaced fracture of third metatarsal bone, left foot, subsequent encounter for fracture with routine healing: Secondary | ICD-10-CM | POA: Diagnosis not present

## 2019-05-07 ENCOUNTER — Telehealth: Payer: Self-pay

## 2019-05-07 DIAGNOSIS — S92332D Displaced fracture of third metatarsal bone, left foot, subsequent encounter for fracture with routine healing: Secondary | ICD-10-CM | POA: Diagnosis not present

## 2019-05-07 DIAGNOSIS — E1122 Type 2 diabetes mellitus with diabetic chronic kidney disease: Secondary | ICD-10-CM | POA: Diagnosis not present

## 2019-05-07 DIAGNOSIS — I129 Hypertensive chronic kidney disease with stage 1 through stage 4 chronic kidney disease, or unspecified chronic kidney disease: Secondary | ICD-10-CM | POA: Diagnosis not present

## 2019-05-07 DIAGNOSIS — J439 Emphysema, unspecified: Secondary | ICD-10-CM | POA: Diagnosis not present

## 2019-05-07 DIAGNOSIS — S82142D Displaced bicondylar fracture of left tibia, subsequent encounter for closed fracture with routine healing: Secondary | ICD-10-CM | POA: Diagnosis not present

## 2019-05-07 DIAGNOSIS — S83242D Other tear of medial meniscus, current injury, left knee, subsequent encounter: Secondary | ICD-10-CM | POA: Diagnosis not present

## 2019-05-07 DIAGNOSIS — S92342D Displaced fracture of fourth metatarsal bone, left foot, subsequent encounter for fracture with routine healing: Secondary | ICD-10-CM | POA: Diagnosis not present

## 2019-05-07 DIAGNOSIS — J9610 Chronic respiratory failure, unspecified whether with hypoxia or hypercapnia: Secondary | ICD-10-CM | POA: Diagnosis not present

## 2019-05-07 DIAGNOSIS — S89302D Unspecified physeal fracture of lower end of left fibula, subsequent encounter for fracture with routine healing: Secondary | ICD-10-CM | POA: Diagnosis not present

## 2019-05-07 NOTE — Telephone Encounter (Signed)
Great!   Recheck 2 weeks

## 2019-05-07 NOTE — Telephone Encounter (Signed)
Left message on Michelle's VM

## 2019-05-07 NOTE — Telephone Encounter (Signed)
Sharyn Lull calling from [facility]. Pts INR is 2.0 today.

## 2019-05-08 ENCOUNTER — Ambulatory Visit: Payer: Medicare HMO

## 2019-05-09 DIAGNOSIS — F319 Bipolar disorder, unspecified: Secondary | ICD-10-CM | POA: Diagnosis not present

## 2019-05-13 DIAGNOSIS — J449 Chronic obstructive pulmonary disease, unspecified: Secondary | ICD-10-CM | POA: Diagnosis not present

## 2019-05-13 DIAGNOSIS — J9601 Acute respiratory failure with hypoxia: Secondary | ICD-10-CM | POA: Diagnosis not present

## 2019-05-14 ENCOUNTER — Telehealth: Payer: Self-pay | Admitting: Family Medicine

## 2019-05-14 ENCOUNTER — Other Ambulatory Visit: Payer: Self-pay | Admitting: Family Medicine

## 2019-05-14 DIAGNOSIS — S83242D Other tear of medial meniscus, current injury, left knee, subsequent encounter: Secondary | ICD-10-CM | POA: Diagnosis not present

## 2019-05-14 DIAGNOSIS — J439 Emphysema, unspecified: Secondary | ICD-10-CM | POA: Diagnosis not present

## 2019-05-14 DIAGNOSIS — S89302D Unspecified physeal fracture of lower end of left fibula, subsequent encounter for fracture with routine healing: Secondary | ICD-10-CM | POA: Diagnosis not present

## 2019-05-14 DIAGNOSIS — E1122 Type 2 diabetes mellitus with diabetic chronic kidney disease: Secondary | ICD-10-CM | POA: Diagnosis not present

## 2019-05-14 DIAGNOSIS — I129 Hypertensive chronic kidney disease with stage 1 through stage 4 chronic kidney disease, or unspecified chronic kidney disease: Secondary | ICD-10-CM | POA: Diagnosis not present

## 2019-05-14 DIAGNOSIS — J9610 Chronic respiratory failure, unspecified whether with hypoxia or hypercapnia: Secondary | ICD-10-CM | POA: Diagnosis not present

## 2019-05-14 DIAGNOSIS — S92342D Displaced fracture of fourth metatarsal bone, left foot, subsequent encounter for fracture with routine healing: Secondary | ICD-10-CM | POA: Diagnosis not present

## 2019-05-14 DIAGNOSIS — S92332D Displaced fracture of third metatarsal bone, left foot, subsequent encounter for fracture with routine healing: Secondary | ICD-10-CM | POA: Diagnosis not present

## 2019-05-14 DIAGNOSIS — S82142D Displaced bicondylar fracture of left tibia, subsequent encounter for closed fracture with routine healing: Secondary | ICD-10-CM | POA: Diagnosis not present

## 2019-05-14 DIAGNOSIS — E876 Hypokalemia: Secondary | ICD-10-CM

## 2019-05-14 NOTE — Telephone Encounter (Signed)
Received call from Shelby with [facility], stating that patient's INR was 2.5 today.  She requests a call notifying her of when she should recheck.  [phone].

## 2019-05-14 NOTE — Telephone Encounter (Signed)
Left message on machine.

## 2019-05-14 NOTE — Telephone Encounter (Signed)
She is ok to go a month if they do that==== some home health say that have to come every week though

## 2019-05-15 ENCOUNTER — Ambulatory Visit: Payer: Self-pay | Admitting: *Deleted

## 2019-05-15 ENCOUNTER — Telehealth: Payer: Self-pay | Admitting: Family Medicine

## 2019-05-15 DIAGNOSIS — S92332D Displaced fracture of third metatarsal bone, left foot, subsequent encounter for fracture with routine healing: Secondary | ICD-10-CM | POA: Diagnosis not present

## 2019-05-15 DIAGNOSIS — E1122 Type 2 diabetes mellitus with diabetic chronic kidney disease: Secondary | ICD-10-CM | POA: Diagnosis not present

## 2019-05-15 DIAGNOSIS — S83242D Other tear of medial meniscus, current injury, left knee, subsequent encounter: Secondary | ICD-10-CM | POA: Diagnosis not present

## 2019-05-15 DIAGNOSIS — S89302D Unspecified physeal fracture of lower end of left fibula, subsequent encounter for fracture with routine healing: Secondary | ICD-10-CM | POA: Diagnosis not present

## 2019-05-15 DIAGNOSIS — S82142D Displaced bicondylar fracture of left tibia, subsequent encounter for closed fracture with routine healing: Secondary | ICD-10-CM | POA: Diagnosis not present

## 2019-05-15 DIAGNOSIS — J439 Emphysema, unspecified: Secondary | ICD-10-CM | POA: Diagnosis not present

## 2019-05-15 DIAGNOSIS — S92342D Displaced fracture of fourth metatarsal bone, left foot, subsequent encounter for fracture with routine healing: Secondary | ICD-10-CM | POA: Diagnosis not present

## 2019-05-15 DIAGNOSIS — J9610 Chronic respiratory failure, unspecified whether with hypoxia or hypercapnia: Secondary | ICD-10-CM | POA: Diagnosis not present

## 2019-05-15 DIAGNOSIS — I129 Hypertensive chronic kidney disease with stage 1 through stage 4 chronic kidney disease, or unspecified chronic kidney disease: Secondary | ICD-10-CM | POA: Diagnosis not present

## 2019-05-15 NOTE — Telephone Encounter (Signed)
Blood sugars running high over last 3 days. Fasting today 231, yesterday's fasting 250's. Also, evening sugars between 250-290's. Occasional lightheadedness. Denies increased urine frequency, urine not concentrated.No fever. Does feel thirsty. Reports 3-4 waters daily. Encouraged 6-7 cups water daily to prevent dehydration (also on Demadex daily). Decreased appetite lately. Eats high carb dishes like mac and cheese, cornbreads. Drinks regular coke and orange drinks daily. Encouraged to decrease or stop the sugary drinks to improve her sugars. Denies vomiting/weakness/SOB. She has [facility] several times weekly due to recent stays in the hospital. B/p good she reports 120/70. Takes Metformin 1040m twice daily and lantus 30u qd. Last HA1c was 6.7 while in the hospital, previous A1c 10.5 on 08/16/18. Care Advice: continue diabetic medications and insulin/monitor and record sugars/work on improving diet with less sugars and carbohydrates. Call if cbg 300 or higher with symptoms. Call for sugars over 300, any difficulty breathing/vomiting/concerns. Stated she understood. Appointment made for Friday. Reason for Disposition . [1] Blood glucose 240 - 300 mg/dL (13.3 - 16.7 mmol/L) AND [2] uses insulin (e.g., insulin-dependent, all people with type 1 diabetes)  Answer Assessment - Initial Assessment Questions 1. BLOOD GLUCOSE: "What is your blood glucose level?"      cbg  231 fasting earlier this morning. 2. ONSET: "When did you check the blood glucose?"     3. USUAL RANGE: "What is your glucose level usually?" (e.g., usual fasting morning value, usual evening value)     120's 4. KETONES: "Do you check for ketones (urine or blood test strips)?" If yes, ask: "What does the test show now?"      no 5. TYPE 1 or 2:  "Do you know what type of diabetes you have?"  (e.g., Type 1, Type 2, Gestational; doesn't know)     Type 2 6. INSULIN: "Do you take insulin?" "What type of insulin(s) do you use? What is the mode of  delivery? (syringe, pen; injection or pump)?"      lantus 30 units at hs via syringe. 7. DIABETES PILLS: "Do you take any pills for your diabetes?" If yes, ask: "Have you missed taking any pills recently?"     Metformin 500 mg 2 tabs in the morning and 2 tabs after dinner. 8. OTHER SYMPTOMS: "Do you have any symptoms?" (e.g., fever, frequent urination, difficulty breathing, dizziness, weakness, vomiting)     In out hospital twice over last month. 9. PREGNANCY: "Is there any chance you are pregnant?" "When was your last menstrual period?"     *No Answer*  Protocols used: DIABETES - HIGH BLOOD SUGAR-A-AH

## 2019-05-16 ENCOUNTER — Other Ambulatory Visit: Payer: Self-pay | Admitting: Family Medicine

## 2019-05-16 MED ORDER — WARFARIN SODIUM 5 MG PO TABS: 2.5000 mg | ORAL_TABLET | Freq: Every day | ORAL | 0 refills | Status: DC

## 2019-05-16 NOTE — Telephone Encounter (Signed)
Spoke to pt and verified that she currently takes Warfarin 2.5mg  daily. Refill sent with updated instructions.

## 2019-05-16 NOTE — Telephone Encounter (Signed)
Pt called about the refill status for her warfarin (COUMADIN) 5 MG tablet Please advise when sent to the pharmacy

## 2019-05-17 ENCOUNTER — Ambulatory Visit: Payer: Medicare HMO | Admitting: Family Medicine

## 2019-05-21 ENCOUNTER — Ambulatory Visit: Payer: Self-pay | Admitting: *Deleted

## 2019-05-21 DIAGNOSIS — J439 Emphysema, unspecified: Secondary | ICD-10-CM | POA: Diagnosis not present

## 2019-05-21 DIAGNOSIS — J449 Chronic obstructive pulmonary disease, unspecified: Secondary | ICD-10-CM | POA: Diagnosis not present

## 2019-05-21 DIAGNOSIS — J9601 Acute respiratory failure with hypoxia: Secondary | ICD-10-CM | POA: Diagnosis not present

## 2019-05-21 NOTE — Telephone Encounter (Signed)
Patient is feeling dizzy and jittery. Patient is having loose stool for 2 days. Patient's glucose level is elevated at 200. Patient called last week- but could not make the appointment- call back to office to reschedule. Glucose levels are not coming down enough and patient is having symptoms. Reason for Disposition . Blood glucose 70-240 mg/dL (3.9 -13.3 mmol/L)  Answer Assessment - Initial Assessment Questions 1. DESCRIPTION: "Describe your dizziness."     Jittery- feels faint at times 2. LIGHTHEADED: "Do you feel lightheaded?" (e.g., somewhat faint, woozy, weak upon standing)     Yes- patient gets warm and feels dizzy 3. VERTIGO: "Do you feel like either you or the room is spinning or tilting?" (i.e. vertigo)     no 4. SEVERITY: "How bad is it?"  "Do you feel like you are going to faint?" "Can you stand and walk?"   - MILD - walking normally   - MODERATE - interferes with normal activities (e.g., work, school)    - SEVERE - unable to stand, requires support to walk, feels like passing out now.      moderate 5. ONSET:  "When did the dizziness begin?"     3-4 days 6. AGGRAVATING FACTORS: "Does anything make it worse?" (e.g., standing, change in head position)     no 7. HEART RATE: "Can you tell me your heart rate?" "How many beats in 15 seconds?"  (Note: not all patients can do this)      T- 98.1, O2 sat 95,  133/77  P 91 8. CAUSE: "What do you think is causing the dizziness?"     Not sure- possible glucose level 9. RECURRENT SYMPTOM: "Have you had dizziness before?" If so, ask: "When was the last time?" "What happened that time?"     Yes- BP fluctuations, a lot of stress- patient does have medication  10. OTHER SYMPTOMS: "Do you have any other symptoms?" (e.g., fever, chest pain, vomiting, diarrhea, bleeding)       Loose stools 11. PREGNANCY: "Is there any chance you are pregnant?" "When was your last menstrual period?"       n/a  Answer Assessment - Initial Assessment Questions 1.  BLOOD GLUCOSE: "What is your blood glucose level?"      191 2. ONSET: "When did you check the blood glucose?"     2 o'clock today 3. USUAL RANGE: "What is your glucose level usually?" (e.g., usual fasting morning value, usual evening value)     100-125 4. KETONES: "Do you check for ketones (urine or blood test strips)?" If yes, ask: "What does the test show now?"      n/a 5. TYPE 1 or 2:  "Do you know what type of diabetes you have?"  (e.g., Type 1, Type 2, Gestational; doesn't know)      Type 2 6. INSULIN: "Do you take insulin?" "What type of insulin(s) do you use? What is the mode of delivery? (syringe, pen; injection or pump)?"      Yes- 30 units Lantus 7. DIABETES PILLS: "Do you take any pills for your diabetes?" If yes, ask: "Have you missed taking any pills recently?"     Yes- metformin 500 mg bid, glipizide 5 mg 1/2 tablet bid 8. OTHER SYMPTOMS: "Do you have any symptoms?" (e.g., fever, frequent urination, difficulty breathing, dizziness, weakness, vomiting)     dizziness 9. PREGNANCY: "Is there any chance you are pregnant?" "When was your last menstrual period?"     n/a  Protocols used: DIABETES - HIGH  BLOOD SUGAR-A-AH, DIZZINESS - [facility]

## 2019-05-22 ENCOUNTER — Other Ambulatory Visit: Payer: Self-pay

## 2019-05-22 NOTE — Telephone Encounter (Signed)
Spoke with patient. Pt states feeling much better.Pt states she spoke with someone at the office and scheduled a appointment for tomorrow. Pt states checking her sugars once today and it was 150.

## 2019-05-22 NOTE — Telephone Encounter (Signed)
If she is confused and dizzy she may need to go to Er It says her blood glucose is ranges 70-240 When is it running high?  ----  If she is not confused on the phone I can do a virtual visit with her today

## 2019-05-22 NOTE — Telephone Encounter (Signed)
Please advise 

## 2019-05-23 ENCOUNTER — Ambulatory Visit: Payer: Medicare HMO | Admitting: Family Medicine

## 2019-05-23 NOTE — Telephone Encounter (Signed)
noted 

## 2019-05-24 DIAGNOSIS — J9610 Chronic respiratory failure, unspecified whether with hypoxia or hypercapnia: Secondary | ICD-10-CM | POA: Diagnosis not present

## 2019-05-24 DIAGNOSIS — S82142D Displaced bicondylar fracture of left tibia, subsequent encounter for closed fracture with routine healing: Secondary | ICD-10-CM | POA: Diagnosis not present

## 2019-05-24 DIAGNOSIS — J439 Emphysema, unspecified: Secondary | ICD-10-CM | POA: Diagnosis not present

## 2019-05-24 DIAGNOSIS — S92332D Displaced fracture of third metatarsal bone, left foot, subsequent encounter for fracture with routine healing: Secondary | ICD-10-CM | POA: Diagnosis not present

## 2019-05-24 DIAGNOSIS — S92342D Displaced fracture of fourth metatarsal bone, left foot, subsequent encounter for fracture with routine healing: Secondary | ICD-10-CM | POA: Diagnosis not present

## 2019-05-24 DIAGNOSIS — I129 Hypertensive chronic kidney disease with stage 1 through stage 4 chronic kidney disease, or unspecified chronic kidney disease: Secondary | ICD-10-CM | POA: Diagnosis not present

## 2019-05-24 DIAGNOSIS — S83242D Other tear of medial meniscus, current injury, left knee, subsequent encounter: Secondary | ICD-10-CM | POA: Diagnosis not present

## 2019-05-24 DIAGNOSIS — S89302D Unspecified physeal fracture of lower end of left fibula, subsequent encounter for fracture with routine healing: Secondary | ICD-10-CM | POA: Diagnosis not present

## 2019-05-24 DIAGNOSIS — E1122 Type 2 diabetes mellitus with diabetic chronic kidney disease: Secondary | ICD-10-CM | POA: Diagnosis not present

## 2019-05-27 ENCOUNTER — Other Ambulatory Visit: Payer: Self-pay | Admitting: Family Medicine

## 2019-05-28 ENCOUNTER — Ambulatory Visit (INDEPENDENT_AMBULATORY_CARE_PROVIDER_SITE_OTHER): Payer: Medicare HMO | Admitting: Family Medicine

## 2019-05-28 ENCOUNTER — Other Ambulatory Visit: Payer: Self-pay

## 2019-05-28 ENCOUNTER — Ambulatory Visit: Payer: Self-pay | Admitting: *Deleted

## 2019-05-28 VITALS — BP 140/78 | HR 94 | Temp 97.2°F | Resp 18 | Ht 63.0 in | Wt 296.9 lb

## 2019-05-28 DIAGNOSIS — I129 Hypertensive chronic kidney disease with stage 1 through stage 4 chronic kidney disease, or unspecified chronic kidney disease: Secondary | ICD-10-CM | POA: Diagnosis not present

## 2019-05-28 DIAGNOSIS — S89302D Unspecified physeal fracture of lower end of left fibula, subsequent encounter for fracture with routine healing: Secondary | ICD-10-CM | POA: Diagnosis not present

## 2019-05-28 DIAGNOSIS — Z23 Encounter for immunization: Secondary | ICD-10-CM

## 2019-05-28 DIAGNOSIS — E785 Hyperlipidemia, unspecified: Secondary | ICD-10-CM | POA: Diagnosis not present

## 2019-05-28 DIAGNOSIS — E1169 Type 2 diabetes mellitus with other specified complication: Secondary | ICD-10-CM | POA: Diagnosis not present

## 2019-05-28 DIAGNOSIS — G8929 Other chronic pain: Secondary | ICD-10-CM | POA: Diagnosis not present

## 2019-05-28 DIAGNOSIS — I10 Essential (primary) hypertension: Secondary | ICD-10-CM

## 2019-05-28 DIAGNOSIS — M25472 Effusion, left ankle: Secondary | ICD-10-CM

## 2019-05-28 DIAGNOSIS — I824Y2 Acute embolism and thrombosis of unspecified deep veins of left proximal lower extremity: Secondary | ICD-10-CM | POA: Diagnosis not present

## 2019-05-28 DIAGNOSIS — E1165 Type 2 diabetes mellitus with hyperglycemia: Secondary | ICD-10-CM | POA: Diagnosis not present

## 2019-05-28 DIAGNOSIS — Z7901 Long term (current) use of anticoagulants: Secondary | ICD-10-CM | POA: Diagnosis not present

## 2019-05-28 DIAGNOSIS — E1122 Type 2 diabetes mellitus with diabetic chronic kidney disease: Secondary | ICD-10-CM | POA: Diagnosis not present

## 2019-05-28 DIAGNOSIS — S92332D Displaced fracture of third metatarsal bone, left foot, subsequent encounter for fracture with routine healing: Secondary | ICD-10-CM | POA: Diagnosis not present

## 2019-05-28 DIAGNOSIS — M25471 Effusion, right ankle: Secondary | ICD-10-CM

## 2019-05-28 DIAGNOSIS — J9610 Chronic respiratory failure, unspecified whether with hypoxia or hypercapnia: Secondary | ICD-10-CM | POA: Diagnosis not present

## 2019-05-28 DIAGNOSIS — S83242D Other tear of medial meniscus, current injury, left knee, subsequent encounter: Secondary | ICD-10-CM | POA: Diagnosis not present

## 2019-05-28 DIAGNOSIS — S92342D Displaced fracture of fourth metatarsal bone, left foot, subsequent encounter for fracture with routine healing: Secondary | ICD-10-CM | POA: Diagnosis not present

## 2019-05-28 DIAGNOSIS — S82142D Displaced bicondylar fracture of left tibia, subsequent encounter for closed fracture with routine healing: Secondary | ICD-10-CM | POA: Diagnosis not present

## 2019-05-28 DIAGNOSIS — J439 Emphysema, unspecified: Secondary | ICD-10-CM | POA: Diagnosis not present

## 2019-05-28 LAB — POCT INR: INR: 2.1 (ref 2.0–3.0)

## 2019-05-28 MED ORDER — TRAMADOL HCL 50 MG PO TABS: ORAL_TABLET | ORAL | 0 refills | Status: DC

## 2019-05-28 MED ORDER — NONFORMULARY OR COMPOUNDED ITEM: 0 refills | Status: DC

## 2019-05-28 MED ORDER — WARFARIN SODIUM 2.5 MG PO TABS: 2.5000 mg | ORAL_TABLET | Freq: Every day | ORAL | 3 refills | Status: DC

## 2019-05-28 NOTE — Telephone Encounter (Signed)
[name], PT with [facility] calling in, pt there with her, regarding new onset of right foot pain and swelling that started yesterday.    Sue [name] is having a hard time bearing weight on her right foot.    Her left foot is mildly swollen too but she has some swelling normally but the right foot is hurting and more swollen than usual.   See notes below.  I warm transferred the call into Dr. Etter Sjogren Chase's office to [facility] [place] to be scheduled.  I sent my triage notes to the office.   Reason for Disposition . [1] Swollen foot AND [2] no fever  (Exceptions: localized bump from bunions, calluses, insect bite, sting)  Answer Assessment - Initial Assessment Questions 1. ONSET: "When did the pain start?"      Her right foot she can't bear weight.   Left foot is hurting also.  This is new.    2. LOCATION: "Where is the pain located?"      Right foot on top below toes of med foot.   It looks a little swollen.   No injuries.     She has some swelling normally but right is worse than left today.    3. PAIN: "How bad is the pain?"    (Scale 1-10; or mild, moderate, severe)   -  MILD (1-3): doesn't interfere with normal activities    -  MODERATE (4-7): interferes with normal activities (e.g., work or school) or awakens from sleep, limping    -  SEVERE (8-10): excruciating pain, unable to do any normal activities, unable to walk     8 on scale. 4. WORK OR EXERCISE: "Has there been any recent work or exercise that involved this part of the body?"      No 5. CAUSE: "What do you think is causing the foot pain?"     No idea.    The pain started yesterday. 6. OTHER SYMPTOMS: "Do you have any other symptoms?" (e.g., leg pain, rash, fever, numbness)     No discoloration, or hot to touch or rashes. 7. PREGNANCY: "Is there any chance you are pregnant?" "When was your last menstrual period?"     N/A  Protocols used: FOOT PAIN-A-AH

## 2019-05-28 NOTE — Telephone Encounter (Signed)
Appt scheduled

## 2019-05-28 NOTE — Progress Notes (Signed)
Patient ID: Sue [name], female    DOB: 12-Jul-1951  Age: 68 y.o. MRN: [id number]    Subjective:  Subjective  HPI Sue [name] presents for feet pain and swelling ---- it was worse yesterday Today it is much better.   Pt is taking all her meds as directed.  No calf pain no sob  No chest pain   Review of Systems  Constitutional: Negative for fever.  HENT: Negative for congestion.   Respiratory: Negative for cough and shortness of breath.   Cardiovascular: Positive for leg swelling. Negative for chest pain and palpitations.  Gastrointestinal: Negative for abdominal pain, blood in stool and nausea.  Genitourinary: Negative for dysuria and frequency.  Skin: Negative for rash.  Allergic/Immunologic: Negative for environmental allergies.  Neurological: Negative for dizziness and headaches.  Psychiatric/Behavioral: The patient is not nervous/anxious.     History Past Medical History:  Diagnosis Date   Allergic rhinitis    Depression    Emphysema of lung (HCC)    3L home O2   GERD (gastroesophageal reflux disease)    Hypertension    Hypothyroidism    Obesity, morbid, BMI 50 or higher ([place])    Stroke ([place]) 2016   TIA    Urine incontinence     She has a past surgical history that includes Abdominal hysterectomy and Cesarean section.   Her family history includes Asthma in her son and son; Bipolar disorder in her father; Depression in her father; Diabetes in her sister; Heart disease in her brother, father, paternal aunt, paternal grandmother, and paternal uncle; Heart disease (age of onset: 54) in her sister; Hyperlipidemia in her sister; Hypertension in her brother, father, and sister; Schizophrenia in her paternal aunt.She reports that she quit smoking about 7 years ago. Her smoking use included cigarettes. She started smoking about 46 years ago. She has a 40.00 pack-year smoking history. She has never used smokeless tobacco. She reports previous alcohol use. She  reports that she does not use drugs.  Current Outpatient Medications on File Prior to Visit  Medication Sig Dispense Refill   ACCU-CHEK GUIDE test strip USE TO CHECK BLOOD SUGAR UP TO FOUR TIMES DAILY 100 each 2   albuterol (PROVENTIL HFA;VENTOLIN HFA) 108 (90 Base) MCG/ACT inhaler Inhale 1-2 puffs into the lungs every 6 (six) hours as needed for wheezing or shortness of breath.     albuterol (PROVENTIL) (2.5 MG/3ML) 0.083% nebulizer solution Take 3 mLs (2.5 mg total) by nebulization every 6 (six) hours as needed for wheezing or shortness of breath. 150 mL 1   blood glucose meter kit and supplies KIT Dispense based on patient and insurance preference. Use up to four times daily as directed. (FOR ICD-9 250.00, 250.01). 1 each 0   busPIRone (BUSPAR) 15 MG tablet Take 15 mg by mouth 2 (two) times daily.   4   famotidine (PEPCID) 20 MG tablet Take 20 mg by mouth daily as needed for heartburn or indigestion.     fluticasone (FLONASE) 50 MCG/ACT nasal spray Place 2 sprays into both nostrils daily as needed for allergies.     glipiZIDE (GLUCOTROL) 5 MG tablet Take 0.5 tablets (2.5 mg total) by mouth 2 (two) times daily before a meal. 30 tablet 11   insulin glargine (LANTUS) 100 UNIT/ML injection Inject 0.3 mLs (30 Units total) into the skin at bedtime. 10 mL 5   Insulin Syringe-Needle U-100 28G X 5/16" 0.5 ML MISC Use with lantus once a day 100 each 1  levothyroxine (SYNTHROID) 50 MCG tablet Take 1 tablet (50 mcg total) by mouth daily. 90 tablet 0   lisinopril (ZESTRIL) 20 MG tablet Take 1 tablet by mouth once daily 90 tablet 0   LORazepam (ATIVAN) 1 MG tablet Take 0.5-1.5 mg by mouth See admin instructions. Take 0.81m by mouth in the morning and 1.561mby mouth at bedtime as needed for anxiety.     metFORMIN (GLUCOPHAGE) 500 MG tablet Take 2 tablets (1,000 mg total) by mouth 2 (two) times daily with a meal. 120 tablet 11   methocarbamol (ROBAXIN) 500 MG tablet Take 1 tablet (500 mg total)  by mouth every 6 (six) hours as needed for muscle spasms. 10 tablet 0   methocarbamol (ROBAXIN) 500 MG tablet Take 1 tablet by mouth 4 times daily 30 tablet 2   metoprolol succinate (TOPROL-XL) 50 MG 24 hr tablet TAKE 2 TABLETS BY MOUTH ONCE DAILY IN THE MORNING WITH A MEAL OR IMMEDIATELY FOLLOWING A MEAL (Patient taking differently: Take 100 mg by mouth daily. ) 180 tablet 1   NONFORMULARY OR COMPOUNDED ITEM Pt/ inr   Dx dvt   Tomorrow 04/05/2019  Please call office [phone] results 1 each 0   NONFORMULARY OR COMPOUNDED ITEM PT/ inr    Dx dvt 1 each 0   ondansetron (ZOFRAN) 4 MG tablet Take 1 tablet (4 mg total) by mouth every 8 (eight) hours as needed for nausea or vomiting. 20 tablet 0   OXYGEN Inhale 3 L into the lungs continuous.      potassium chloride SA (K-DUR) 20 MEQ tablet Take 1 tablet by mouth once daily 30 tablet 0   QUEtiapine (SEROQUEL) 100 MG tablet Take 100 mg by mouth at bedtime.     torsemide (DEMADEX) 20 MG tablet Take 2 tablets (40 mg total) by mouth daily. 180 tablet 1   warfarin (COUMADIN) 5 MG tablet Take 0.5 tablets (2.5 mg total) by mouth daily at 6 PM. 90 tablet 0   zolpidem (AMBIEN) 5 MG tablet Take 2 tablets (10 mg total) by mouth at bedtime.     enoxaparin (LOVENOX) 150 MG/ML injection Inject 0.93 mLs (140 mg total) into the skin every 12 (twelve) hours for 10 days. (Patient not taking: Reported on 04/02/2019) 18.6 mL 0   No current facility-administered medications on file prior to visit.      Objective:  Objective  Physical Exam Vitals signs and nursing note reviewed.  Constitutional:      Appearance: She is well-developed.  HENT:     Head: Normocephalic and atraumatic.  Eyes:     Conjunctiva/sclera: Conjunctivae normal.  Neck:     Musculoskeletal: Normal range of motion and neck supple.     Thyroid: No thyromegaly.     Vascular: No carotid bruit or JVD.  Cardiovascular:     Rate and Rhythm: Normal rate and regular rhythm.     Heart  sounds: Normal heart sounds. No murmur.  Pulmonary:     Effort: Pulmonary effort is normal. No respiratory distress.     Breath sounds: Normal breath sounds. No wheezing or rales.  Chest:     Chest wall: No tenderness.  Musculoskeletal:        General: Swelling present. No tenderness.     Right lower leg: She exhibits no tenderness. Edema present.     Left lower leg: She exhibits no tenderness. Edema present.     Right foot: Swelling present. No tenderness.     Left foot: Swelling present.  Neurological:     Mental Status: She is alert and oriented to person, place, and time.    BP 140/78 (BP Location: Left Arm, Patient Position: Sitting, Cuff Size: Normal)    Pulse 94    Temp (!) 97.2 F (36.2 C) (Temporal)    Resp 18    Ht '5\' 3"'  (1.6 m)    Wt 296 lb 14.4 oz (134.7 kg)    SpO2 95%    BMI 52.59 kg/m  Wt Readings from Last 3 Encounters:  05/28/19 296 lb 14.4 oz (134.7 kg)  04/04/19 298 lb (135.2 kg)  04/02/19 298 lb (135.2 kg)     Lab Results  Component Value Date   WBC 6.3 04/02/2019   HGB 10.3 (L) 04/02/2019   HCT 35.1 (L) 04/02/2019   PLT 236 04/02/2019   GLUCOSE 129 (H) 04/02/2019   CHOL 126 02/27/2017   TRIG 77.0 02/27/2017   HDL 30.30 (L) 02/27/2017   LDLCALC 81 02/27/2017   ALT 16 04/02/2019   AST 27 04/02/2019   NA 140 04/02/2019   K 3.6 04/02/2019   CL 100 04/02/2019   CREATININE 1.79 (H) 04/02/2019   BUN 18 04/02/2019   CO2 30 04/02/2019   TSH 7.383 (H) 02/25/2019   INR 2.1 05/28/2019   HGBA1C 6.7 (H) 02/25/2019    Vas [place] Lower Extremity Venous (dvt) (only [facility])  Result Date: 04/02/2019  Lower Venous Study Indications: Edema.  Risk Factors: DVT History of left popliteal DVT 03/10/2019. Limitations: Body habitus. Comparison Study: 03/10/2019 Rt negative, Lt popliteal DVT Performing Technologist: Toma Copier RVS  Examination Guidelines: A complete evaluation includes B-mode imaging, spectral Doppler, color Doppler, and power Doppler as needed of all  accessible portions of each vessel. Bilateral testing is considered an integral part of a complete examination. Limited examinations for reoccurring indications may be performed as noted.  +---------+---------------+---------+-----------+----------+-------------------+  RIGHT     Compressibility Phasicity Spontaneity Properties Summary              +---------+---------------+---------+-----------+----------+-------------------+  CFV       Full            Yes       Yes                                         +---------+---------------+---------+-----------+----------+-------------------+  SFJ       Full                                                                  +---------+---------------+---------+-----------+----------+-------------------+  FV Prox   Full            Yes       Yes                                         +---------+---------------+---------+-----------+----------+-------------------+  FV Mid    Full                                                                  +---------+---------------+---------+-----------+----------+-------------------+  FV Distal Full            Yes       Yes                                         +---------+---------------+---------+-----------+----------+-------------------+  PFV       Full            Yes       Yes                                         +---------+---------------+---------+-----------+----------+-------------------+  POP       Full            Yes       Yes                                         +---------+---------------+---------+-----------+----------+-------------------+  PTV       Full                                                                  +---------+---------------+---------+-----------+----------+-------------------+  PERO                                                       Only able to                                                                     visualize on                                                                      augmentation. Color                                                              flow appears normal  at that time         +---------+---------------+---------+-----------+----------+-------------------+   +---------+---------------+---------+-----------+----------+-------------------+  LEFT      Compressibility Phasicity Spontaneity Properties Summary              +---------+---------------+---------+-----------+----------+-------------------+  CFV       Full            Yes       Yes                                         +---------+---------------+---------+-----------+----------+-------------------+  SFJ       Full                                                                  +---------+---------------+---------+-----------+----------+-------------------+  FV Prox   Full            Yes       Yes                                         +---------+---------------+---------+-----------+----------+-------------------+  FV Mid    Full                                                                  +---------+---------------+---------+-----------+----------+-------------------+  FV Distal                 Yes       Yes                    Difficult to                                                                     visualize due to                                                                 body habitus         +---------+---------------+---------+-----------+----------+-------------------+  PFV       Full            Yes       Yes                                         +---------+---------------+---------+-----------+----------+-------------------+  POP       Full  Yes       Yes                                         +---------+---------------+---------+-----------+----------+-------------------+  PTV       Full                                             Difficult to                                                                      visualize            +---------+---------------+---------+-----------+----------+-------------------+  PERO                                                       Only able to                                                                     visualize with                                                                   augmentation. Color                                                              flow appears normal                                                              at that time\        +---------+---------------+---------+-----------+----------+-------------------+   Left Technical Findings: Previous DVT noted in the popliteal vein appears to have resolved.   Summary: Right: There is no evidence of deep vein thrombosis in the lower extremity. No cystic structure found in the popliteal fossa. See technical summary notes listed above Left: There is no evidence of deep vein thrombosis in the lower extremity. No cystic structure found in the popliteal fossa. See summary notes listed above. See technical findings listed above.  *See table(s) above  for measurements and observations. Electronically signed by Deitra Mayo MD on 04/02/2019 at 5:59:55 PM.    Final    Vas [place] Upper Extremity Venous Duplex  Result Date: 04/02/2019 UPPER VENOUS STUDY  Indications: Edema Limitations: Body habitus. Performing Technologist: Toma Copier RVS  Examination Guidelines: A complete evaluation includes B-mode imaging, spectral Doppler, color Doppler, and power Doppler as needed of all accessible portions of each vessel. Bilateral testing is considered an integral part of a complete examination. Limited examinations for reoccurring indications may be performed as noted.  Right Findings: +----------+------------+---------+-----------+----------+-------+  RIGHT      Compressible Phasicity Spontaneous Properties Summary  +----------+------------+---------+-----------+----------+-------+  IJV            Full         Yes        Yes                         +----------+------------+---------+-----------+----------+-------+  Subclavian     Full        Yes        Yes                         +----------+------------+---------+-----------+----------+-------+  Axillary       Full        Yes        Yes                         +----------+------------+---------+-----------+----------+-------+  Brachial       Full        Yes        Yes                         +----------+------------+---------+-----------+----------+-------+  Radial         Full        Yes        Yes                         +----------+------------+---------+-----------+----------+-------+  Ulnar          Full                                               +----------+------------+---------+-----------+----------+-------+  Cephalic       Full                                               +----------+------------+---------+-----------+----------+-------+  Basilic        Full                                               +----------+------------+---------+-----------+----------+-------+  Left Findings: +----------+------------+---------+-----------+----------+-------+  LEFT       Compressible Phasicity Spontaneous Properties Summary  +----------+------------+---------+-----------+----------+-------+  Subclavian     Full        Yes        Yes                         +----------+------------+---------+-----------+----------+-------+  Summary:  Right: No evidence of deep vein thrombosis in the upper extremity. No evidence of superficial vein thrombosis in the upper extremity. No evidence of thrombosis in the subclavian.  Left: No evidence of thrombosis in the subclavian.  *See table(s) above for measurements and observations.  Diagnosing physician: Deitra Mayo MD Electronically signed by Deitra Mayo MD on 04/02/2019 at 5:59:25 PM.    Final      Assessment & Plan:  Plan  I am having Heber Castle. Baglio start on NONFORMULARY OR COMPOUNDED ITEM and warfarin. I  am also having her maintain her OXYGEN, albuterol, albuterol, fluticasone, zolpidem, busPIRone, metFORMIN, glipiZIDE, blood glucose meter kit and supplies, Insulin Syringe-Needle U-100, insulin glargine, metoprolol succinate, levothyroxine, QUEtiapine, famotidine, methocarbamol, ondansetron, LORazepam, enoxaparin, torsemide, NONFORMULARY OR COMPOUNDED ITEM, NONFORMULARY OR COMPOUNDED ITEM, methocarbamol, lisinopril, potassium chloride SA, warfarin, Accu-Chek Guide, and traMADol.  Meds ordered this encounter  Medications   NONFORMULARY OR COMPOUNDED ITEM    Sig: Compression stockings  20-30 mm/hg  #1   Dx low ext edema    Dispense:  1 each    Refill:  0   traMADol (ULTRAM) 50 MG tablet    Sig: TAKE 1 TABLET BY MOUTH EVERY 8 HOURS AS NEEDED FOR UP TO 5 DAYS    Dispense:  30 tablet    Refill:  0   warfarin (COUMADIN) 2.5 MG tablet    Sig: Take 1 tablet (2.5 mg total) by mouth daily.    Dispense:  30 tablet    Refill:  3    Problem List Items Addressed This Visit      Unprioritized   Diabetes mellitus type II, uncontrolled ([place]) - Primary    Check labs today      Relevant Orders   Hemoglobin A1c   Comprehensive metabolic panel   CBC with Differential/Platelet   Amb Ref to Medical Weight Management   DVT (deep venous thrombosis) (HCC)    inr 2.1 today Home health to recheck oct  con't current dose       Relevant Medications   warfarin (COUMADIN) 2.5 MG tablet   Edema of both ankles    con't diuretic Compression stockings       Relevant Medications   NONFORMULARY OR COMPOUNDED ITEM   HTN (hypertension)    Well controlled, no changes to meds. Encouraged heart healthy diet such as the DASH diet and exercise as tolerated.       Relevant Medications   warfarin (COUMADIN) 2.5 MG tablet   Hyperlipidemia associated with type 2 diabetes mellitus ([place])    Encouraged heart healthy diet, increase exercise, avoid trans fats, consider a krill oil cap daily      Relevant Orders     Comprehensive metabolic panel   CBC with Differential/Platelet   Lipid panel    Other Visit Diagnoses    Morbid obesity ([place])       Relevant Orders   Amb Ref to Medical Weight Management   Other chronic pain       Relevant Medications   traMADol (ULTRAM) 50 MG tablet   Long term (current) use of anticoagulants       Relevant Orders   POCT INR (Completed)   Need for influenza vaccination       Relevant Orders   Flu Vaccine QUAD High Dose(Fluad) (Completed)      Follow-up: Return in about 3 months (around 08/27/2019), or if symptoms worsen or fail to improve.  Ann Held, DO

## 2019-05-28 NOTE — Patient Instructions (Signed)

## 2019-05-29 ENCOUNTER — Encounter: Payer: Self-pay | Admitting: Family Medicine

## 2019-05-29 ENCOUNTER — Other Ambulatory Visit: Payer: Self-pay | Admitting: Family Medicine

## 2019-05-29 ENCOUNTER — Other Ambulatory Visit: Payer: Self-pay

## 2019-05-29 DIAGNOSIS — E1165 Type 2 diabetes mellitus with hyperglycemia: Secondary | ICD-10-CM | POA: Insufficient documentation

## 2019-05-29 DIAGNOSIS — IMO0002 Reserved for concepts with insufficient information to code with codable children: Secondary | ICD-10-CM | POA: Insufficient documentation

## 2019-05-29 DIAGNOSIS — D631 Anemia in chronic kidney disease: Secondary | ICD-10-CM

## 2019-05-29 DIAGNOSIS — E1129 Type 2 diabetes mellitus with other diabetic kidney complication: Secondary | ICD-10-CM | POA: Insufficient documentation

## 2019-05-29 DIAGNOSIS — J440 Chronic obstructive pulmonary disease with acute lower respiratory infection: Secondary | ICD-10-CM | POA: Diagnosis not present

## 2019-05-29 DIAGNOSIS — R7989 Other specified abnormal findings of blood chemistry: Secondary | ICD-10-CM

## 2019-05-29 DIAGNOSIS — E1169 Type 2 diabetes mellitus with other specified complication: Secondary | ICD-10-CM | POA: Insufficient documentation

## 2019-05-29 LAB — COMPREHENSIVE METABOLIC PANEL
ALT: 9 U/L (ref 0–35)
AST: 17 U/L (ref 0–37)
Albumin: 3.6 g/dL (ref 3.5–5.2)
Alkaline Phosphatase: 121 U/L — ABNORMAL HIGH (ref 39–117)
BUN: 25 mg/dL — ABNORMAL HIGH (ref 6–23)
CO2: 30 mEq/L (ref 19–32)
Calcium: 7.4 mg/dL — ABNORMAL LOW (ref 8.4–10.5)
Chloride: 97 mEq/L (ref 96–112)
Creatinine, Ser: 1.68 mg/dL — ABNORMAL HIGH (ref 0.40–1.20)
GFR: 36.79 mL/min — ABNORMAL LOW (ref >60.00)
Glucose, Bld: 133 mg/dL — ABNORMAL HIGH (ref 70–99)
Potassium: 3.5 mEq/L (ref 3.5–5.1)
Sodium: 138 mEq/L (ref 135–145)
Total Bilirubin: 0.4 mg/dL (ref 0.2–1.2)
Total Protein: 8 g/dL (ref 6.0–8.3)

## 2019-05-29 LAB — LIPID PANEL
Cholesterol: 125 mg/dL (ref 0–200)
HDL: 32 mg/dL — ABNORMAL LOW (ref >39.00)
LDL Cholesterol: 74 mg/dL (ref 0–99)
NonHDL: 93.18
Total CHOL/HDL Ratio: 4
Triglycerides: 94 mg/dL (ref 0.0–149.0)
VLDL: 18.8 mg/dL (ref 0.0–40.0)

## 2019-05-29 LAB — CBC WITH DIFFERENTIAL/PLATELET
Basophils Absolute: 0.1 10*3/uL (ref 0.0–0.1)
Basophils Relative: 1 % (ref 0.0–3.0)
Eosinophils Absolute: 0.3 10*3/uL (ref 0.0–0.7)
Eosinophils Relative: 2.8 % (ref 0.0–5.0)
HCT: 32.5 % — ABNORMAL LOW (ref 36.0–46.0)
Hemoglobin: 10.4 g/dL — ABNORMAL LOW (ref 12.0–15.0)
Lymphocytes Relative: 27.9 % (ref 12.0–46.0)
Lymphs Abs: 2.5 10*3/uL (ref 0.7–4.0)
MCHC: 32.1 g/dL (ref 30.0–36.0)
MCV: 92.2 fl (ref 78.0–100.0)
Monocytes Absolute: 0.5 10*3/uL (ref 0.1–1.0)
Monocytes Relative: 5.9 % (ref 3.0–12.0)
Neutro Abs: 5.7 10*3/uL (ref 1.4–7.7)
Neutrophils Relative %: 62.4 % (ref 43.0–77.0)
Platelets: 184 10*3/uL (ref 150.0–400.0)
RBC: 3.52 Mil/uL — ABNORMAL LOW (ref 3.87–5.11)
RDW: 14.3 % (ref 11.5–15.5)
WBC: 9.1 10*3/uL (ref 4.0–10.5)

## 2019-05-29 LAB — HEMOGLOBIN A1C: Hgb A1c MFr Bld: 7.1 % — ABNORMAL HIGH (ref 4.6–6.5)

## 2019-05-29 MED ORDER — GLIPIZIDE 5 MG PO TABS: 5.0000 mg | ORAL_TABLET | Freq: Two times a day (BID) | ORAL | 2 refills | Status: DC

## 2019-05-29 NOTE — Assessment & Plan Note (Signed)
con't diuretic Compression stockings

## 2019-05-29 NOTE — Assessment & Plan Note (Signed)
Check labs today.

## 2019-05-29 NOTE — Assessment & Plan Note (Signed)
inr 2.1 today Home health to recheck oct  con't current dose

## 2019-05-29 NOTE — Assessment & Plan Note (Signed)
Encouraged heart healthy diet, increase exercise, avoid trans fats, consider a krill oil cap daily 

## 2019-05-29 NOTE — Assessment & Plan Note (Signed)
Well controlled, no changes to meds. Encouraged heart healthy diet such as the DASH diet and exercise as tolerated.  °

## 2019-06-04 DIAGNOSIS — J9610 Chronic respiratory failure, unspecified whether with hypoxia or hypercapnia: Secondary | ICD-10-CM | POA: Diagnosis not present

## 2019-06-04 DIAGNOSIS — I129 Hypertensive chronic kidney disease with stage 1 through stage 4 chronic kidney disease, or unspecified chronic kidney disease: Secondary | ICD-10-CM | POA: Diagnosis not present

## 2019-06-04 DIAGNOSIS — S83242D Other tear of medial meniscus, current injury, left knee, subsequent encounter: Secondary | ICD-10-CM | POA: Diagnosis not present

## 2019-06-04 DIAGNOSIS — S92342D Displaced fracture of fourth metatarsal bone, left foot, subsequent encounter for fracture with routine healing: Secondary | ICD-10-CM | POA: Diagnosis not present

## 2019-06-04 DIAGNOSIS — S89302D Unspecified physeal fracture of lower end of left fibula, subsequent encounter for fracture with routine healing: Secondary | ICD-10-CM | POA: Diagnosis not present

## 2019-06-04 DIAGNOSIS — S92332D Displaced fracture of third metatarsal bone, left foot, subsequent encounter for fracture with routine healing: Secondary | ICD-10-CM | POA: Diagnosis not present

## 2019-06-04 DIAGNOSIS — E1122 Type 2 diabetes mellitus with diabetic chronic kidney disease: Secondary | ICD-10-CM | POA: Diagnosis not present

## 2019-06-04 DIAGNOSIS — S82142D Displaced bicondylar fracture of left tibia, subsequent encounter for closed fracture with routine healing: Secondary | ICD-10-CM | POA: Diagnosis not present

## 2019-06-04 DIAGNOSIS — J439 Emphysema, unspecified: Secondary | ICD-10-CM | POA: Diagnosis not present

## 2019-06-20 ENCOUNTER — Other Ambulatory Visit: Payer: Self-pay | Admitting: Family Medicine

## 2019-06-20 DIAGNOSIS — J439 Emphysema, unspecified: Secondary | ICD-10-CM | POA: Diagnosis not present

## 2019-06-20 DIAGNOSIS — J9601 Acute respiratory failure with hypoxia: Secondary | ICD-10-CM | POA: Diagnosis not present

## 2019-06-20 DIAGNOSIS — J449 Chronic obstructive pulmonary disease, unspecified: Secondary | ICD-10-CM | POA: Diagnosis not present

## 2019-06-20 DIAGNOSIS — E876 Hypokalemia: Secondary | ICD-10-CM

## 2019-06-24 ENCOUNTER — Other Ambulatory Visit: Payer: Self-pay | Admitting: Family Medicine

## 2019-06-24 DIAGNOSIS — E876 Hypokalemia: Secondary | ICD-10-CM

## 2019-06-26 DIAGNOSIS — S92332D Displaced fracture of third metatarsal bone, left foot, subsequent encounter for fracture with routine healing: Secondary | ICD-10-CM | POA: Diagnosis not present

## 2019-06-26 DIAGNOSIS — Z87891 Personal history of nicotine dependence: Secondary | ICD-10-CM

## 2019-06-26 DIAGNOSIS — S82142D Displaced bicondylar fracture of left tibia, subsequent encounter for closed fracture with routine healing: Secondary | ICD-10-CM | POA: Diagnosis not present

## 2019-06-26 DIAGNOSIS — J439 Emphysema, unspecified: Secondary | ICD-10-CM

## 2019-06-26 DIAGNOSIS — F329 Major depressive disorder, single episode, unspecified: Secondary | ICD-10-CM

## 2019-06-26 DIAGNOSIS — E1122 Type 2 diabetes mellitus with diabetic chronic kidney disease: Secondary | ICD-10-CM

## 2019-06-26 DIAGNOSIS — S83242D Other tear of medial meniscus, current injury, left knee, subsequent encounter: Secondary | ICD-10-CM

## 2019-06-26 DIAGNOSIS — N183 Chronic kidney disease, stage 3 unspecified: Secondary | ICD-10-CM

## 2019-06-26 DIAGNOSIS — Z9181 History of falling: Secondary | ICD-10-CM

## 2019-06-26 DIAGNOSIS — Z8673 Personal history of transient ischemic attack (TIA), and cerebral infarction without residual deficits: Secondary | ICD-10-CM

## 2019-06-26 DIAGNOSIS — K219 Gastro-esophageal reflux disease without esophagitis: Secondary | ICD-10-CM

## 2019-06-26 DIAGNOSIS — E039 Hypothyroidism, unspecified: Secondary | ICD-10-CM

## 2019-06-26 DIAGNOSIS — S89302D Unspecified physeal fracture of lower end of left fibula, subsequent encounter for fracture with routine healing: Secondary | ICD-10-CM | POA: Diagnosis not present

## 2019-06-26 DIAGNOSIS — J9610 Chronic respiratory failure, unspecified whether with hypoxia or hypercapnia: Secondary | ICD-10-CM

## 2019-06-26 DIAGNOSIS — Z5181 Encounter for therapeutic drug level monitoring: Secondary | ICD-10-CM

## 2019-06-26 DIAGNOSIS — Z7901 Long term (current) use of anticoagulants: Secondary | ICD-10-CM

## 2019-06-26 DIAGNOSIS — Z9981 Dependence on supplemental oxygen: Secondary | ICD-10-CM

## 2019-06-26 DIAGNOSIS — I129 Hypertensive chronic kidney disease with stage 1 through stage 4 chronic kidney disease, or unspecified chronic kidney disease: Secondary | ICD-10-CM

## 2019-06-26 DIAGNOSIS — S92342D Displaced fracture of fourth metatarsal bone, left foot, subsequent encounter for fracture with routine healing: Secondary | ICD-10-CM | POA: Diagnosis not present

## 2019-06-26 DIAGNOSIS — Z794 Long term (current) use of insulin: Secondary | ICD-10-CM

## 2019-06-28 DIAGNOSIS — J440 Chronic obstructive pulmonary disease with acute lower respiratory infection: Secondary | ICD-10-CM | POA: Diagnosis not present

## 2019-07-01 ENCOUNTER — Other Ambulatory Visit: Payer: Self-pay | Admitting: Family Medicine

## 2019-07-01 DIAGNOSIS — E039 Hypothyroidism, unspecified: Secondary | ICD-10-CM

## 2019-07-15 ENCOUNTER — Other Ambulatory Visit: Payer: Self-pay | Admitting: Family Medicine

## 2019-07-15 DIAGNOSIS — R600 Localized edema: Secondary | ICD-10-CM

## 2019-07-21 DIAGNOSIS — J449 Chronic obstructive pulmonary disease, unspecified: Secondary | ICD-10-CM | POA: Diagnosis not present

## 2019-07-21 DIAGNOSIS — J9601 Acute respiratory failure with hypoxia: Secondary | ICD-10-CM | POA: Diagnosis not present

## 2019-07-21 DIAGNOSIS — J439 Emphysema, unspecified: Secondary | ICD-10-CM | POA: Diagnosis not present

## 2019-07-29 DIAGNOSIS — J440 Chronic obstructive pulmonary disease with acute lower respiratory infection: Secondary | ICD-10-CM | POA: Diagnosis not present

## 2019-08-02 ENCOUNTER — Other Ambulatory Visit: Payer: Self-pay

## 2019-08-02 ENCOUNTER — Inpatient Hospital Stay (HOSPITAL_COMMUNITY): Payer: Medicare HMO

## 2019-08-02 ENCOUNTER — Emergency Department (HOSPITAL_COMMUNITY): Payer: Medicare HMO

## 2019-08-02 ENCOUNTER — Inpatient Hospital Stay (HOSPITAL_COMMUNITY)
Admission: EM | Admit: 2019-08-02 | Discharge: 2019-08-08 | DRG: 378 | Disposition: A | Discharge status: Home / Self Care | Payer: Medicare HMO | Attending: Internal Medicine | Admitting: Internal Medicine

## 2019-08-02 ENCOUNTER — Ambulatory Visit: Payer: Self-pay

## 2019-08-02 ENCOUNTER — Encounter (HOSPITAL_COMMUNITY): Payer: Self-pay | Admitting: Emergency Medicine

## 2019-08-02 DIAGNOSIS — K219 Gastro-esophageal reflux disease without esophagitis: Secondary | ICD-10-CM | POA: Diagnosis not present

## 2019-08-02 DIAGNOSIS — Z888 Allergy status to other drugs, medicaments and biological substances status: Secondary | ICD-10-CM | POA: Diagnosis not present

## 2019-08-02 DIAGNOSIS — Z8673 Personal history of transient ischemic attack (TIA), and cerebral infarction without residual deficits: Secondary | ICD-10-CM | POA: Diagnosis not present

## 2019-08-02 DIAGNOSIS — IMO0002 Reserved for concepts with insufficient information to code with codable children: Secondary | ICD-10-CM | POA: Diagnosis present

## 2019-08-02 DIAGNOSIS — E039 Hypothyroidism, unspecified: Secondary | ICD-10-CM | POA: Diagnosis present

## 2019-08-02 DIAGNOSIS — I129 Hypertensive chronic kidney disease with stage 1 through stage 4 chronic kidney disease, or unspecified chronic kidney disease: Secondary | ICD-10-CM | POA: Diagnosis present

## 2019-08-02 DIAGNOSIS — F419 Anxiety disorder, unspecified: Secondary | ICD-10-CM | POA: Diagnosis present

## 2019-08-02 DIAGNOSIS — K8020 Calculus of gallbladder without cholecystitis without obstruction: Secondary | ICD-10-CM | POA: Diagnosis not present

## 2019-08-02 DIAGNOSIS — I10 Essential (primary) hypertension: Secondary | ICD-10-CM | POA: Diagnosis not present

## 2019-08-02 DIAGNOSIS — K5730 Diverticulosis of large intestine without perforation or abscess without bleeding: Secondary | ICD-10-CM | POA: Diagnosis not present

## 2019-08-02 DIAGNOSIS — Z885 Allergy status to narcotic agent status: Secondary | ICD-10-CM | POA: Diagnosis not present

## 2019-08-02 DIAGNOSIS — Z87891 Personal history of nicotine dependence: Secondary | ICD-10-CM

## 2019-08-02 DIAGNOSIS — Z6841 Body Mass Index (BMI) 40.0 and over, adult: Secondary | ICD-10-CM

## 2019-08-02 DIAGNOSIS — Z9981 Dependence on supplemental oxygen: Secondary | ICD-10-CM

## 2019-08-02 DIAGNOSIS — Z825 Family history of asthma and other chronic lower respiratory diseases: Secondary | ICD-10-CM | POA: Diagnosis not present

## 2019-08-02 DIAGNOSIS — D539 Nutritional anemia, unspecified: Secondary | ICD-10-CM | POA: Diagnosis not present

## 2019-08-02 DIAGNOSIS — E1122 Type 2 diabetes mellitus with diabetic chronic kidney disease: Secondary | ICD-10-CM | POA: Diagnosis not present

## 2019-08-02 DIAGNOSIS — I824Y2 Acute embolism and thrombosis of unspecified deep veins of left proximal lower extremity: Secondary | ICD-10-CM | POA: Diagnosis not present

## 2019-08-02 DIAGNOSIS — R0602 Shortness of breath: Secondary | ICD-10-CM | POA: Diagnosis not present

## 2019-08-02 DIAGNOSIS — R911 Solitary pulmonary nodule: Secondary | ICD-10-CM | POA: Diagnosis present

## 2019-08-02 DIAGNOSIS — E1165 Type 2 diabetes mellitus with hyperglycemia: Secondary | ICD-10-CM | POA: Diagnosis not present

## 2019-08-02 DIAGNOSIS — Z8349 Family history of other endocrine, nutritional and metabolic diseases: Secondary | ICD-10-CM

## 2019-08-02 DIAGNOSIS — Z7901 Long term (current) use of anticoagulants: Secondary | ICD-10-CM

## 2019-08-02 DIAGNOSIS — R9431 Abnormal electrocardiogram [ECG] [EKG]: Secondary | ICD-10-CM

## 2019-08-02 DIAGNOSIS — Z79890 Hormone replacement therapy (postmenopausal): Secondary | ICD-10-CM

## 2019-08-02 DIAGNOSIS — K640 First degree hemorrhoids: Secondary | ICD-10-CM | POA: Diagnosis present

## 2019-08-02 DIAGNOSIS — K922 Gastrointestinal hemorrhage, unspecified: Secondary | ICD-10-CM | POA: Diagnosis present

## 2019-08-02 DIAGNOSIS — J9611 Chronic respiratory failure with hypoxia: Secondary | ICD-10-CM | POA: Diagnosis present

## 2019-08-02 DIAGNOSIS — Z833 Family history of diabetes mellitus: Secondary | ICD-10-CM | POA: Diagnosis not present

## 2019-08-02 DIAGNOSIS — E785 Hyperlipidemia, unspecified: Secondary | ICD-10-CM | POA: Diagnosis present

## 2019-08-02 DIAGNOSIS — Z86718 Personal history of other venous thrombosis and embolism: Secondary | ICD-10-CM | POA: Diagnosis not present

## 2019-08-02 DIAGNOSIS — Z20828 Contact with and (suspected) exposure to other viral communicable diseases: Secondary | ICD-10-CM | POA: Diagnosis present

## 2019-08-02 DIAGNOSIS — E1129 Type 2 diabetes mellitus with other diabetic kidney complication: Secondary | ICD-10-CM | POA: Diagnosis present

## 2019-08-02 DIAGNOSIS — N1832 Chronic kidney disease, stage 3b: Secondary | ICD-10-CM | POA: Diagnosis not present

## 2019-08-02 DIAGNOSIS — E1169 Type 2 diabetes mellitus with other specified complication: Secondary | ICD-10-CM | POA: Diagnosis present

## 2019-08-02 DIAGNOSIS — J439 Emphysema, unspecified: Secondary | ICD-10-CM | POA: Diagnosis not present

## 2019-08-02 DIAGNOSIS — D649 Anemia, unspecified: Secondary | ICD-10-CM

## 2019-08-02 DIAGNOSIS — F329 Major depressive disorder, single episode, unspecified: Secondary | ICD-10-CM | POA: Diagnosis present

## 2019-08-02 DIAGNOSIS — K5731 Diverticulosis of large intestine without perforation or abscess with bleeding: Secondary | ICD-10-CM | POA: Diagnosis not present

## 2019-08-02 DIAGNOSIS — Z79899 Other long term (current) drug therapy: Secondary | ICD-10-CM

## 2019-08-02 DIAGNOSIS — D62 Acute posthemorrhagic anemia: Secondary | ICD-10-CM

## 2019-08-02 DIAGNOSIS — Z8249 Family history of ischemic heart disease and other diseases of the circulatory system: Secondary | ICD-10-CM | POA: Diagnosis not present

## 2019-08-02 DIAGNOSIS — Z818 Family history of other mental and behavioral disorders: Secondary | ICD-10-CM | POA: Diagnosis not present

## 2019-08-02 DIAGNOSIS — I82409 Acute embolism and thrombosis of unspecified deep veins of unspecified lower extremity: Secondary | ICD-10-CM | POA: Diagnosis present

## 2019-08-02 DIAGNOSIS — E876 Hypokalemia: Secondary | ICD-10-CM | POA: Diagnosis present

## 2019-08-02 DIAGNOSIS — Z794 Long term (current) use of insulin: Secondary | ICD-10-CM

## 2019-08-02 DIAGNOSIS — K921 Melena: Secondary | ICD-10-CM | POA: Diagnosis not present

## 2019-08-02 DIAGNOSIS — K625 Hemorrhage of anus and rectum: Secondary | ICD-10-CM | POA: Diagnosis not present

## 2019-08-02 LAB — PROTIME-INR
INR: 2.4 — ABNORMAL HIGH (ref 0.8–1.2)
Prothrombin Time: 25.7 seconds — ABNORMAL HIGH (ref 11.4–15.2)

## 2019-08-02 LAB — COMPREHENSIVE METABOLIC PANEL
ALT: 12 U/L (ref 0–44)
AST: 21 U/L (ref 15–41)
Albumin: 3.1 g/dL — ABNORMAL LOW (ref 3.5–5.0)
Alkaline Phosphatase: 127 U/L — ABNORMAL HIGH (ref 38–126)
Anion gap: 9 (ref 5–15)
BUN: 33 mg/dL — ABNORMAL HIGH (ref 8–23)
CO2: 30 mmol/L (ref 22–32)
Calcium: 7.8 mg/dL — ABNORMAL LOW (ref 8.9–10.3)
Chloride: 101 mmol/L (ref 98–111)
Creatinine, Ser: 1.59 mg/dL — ABNORMAL HIGH (ref 0.44–1.00)
GFR calc Af Amer: 39 mL/min — ABNORMAL LOW (ref >60)
GFR calc non Af Amer: 33 mL/min — ABNORMAL LOW (ref >60)
Glucose, Bld: 186 mg/dL — ABNORMAL HIGH (ref 70–99)
Potassium: 3.4 mmol/L — ABNORMAL LOW (ref 3.5–5.1)
Sodium: 140 mmol/L (ref 135–145)
Total Bilirubin: 0.6 mg/dL (ref 0.3–1.2)
Total Protein: 7.4 g/dL (ref 6.5–8.1)

## 2019-08-02 LAB — CBC
HCT: 15.2 % — ABNORMAL LOW (ref 36.0–46.0)
Hemoglobin: 4.6 g/dL — CL (ref 12.0–15.0)
MCH: 28 pg (ref 26.0–34.0)
MCHC: 30.3 g/dL (ref 30.0–36.0)
MCV: 92.7 fL (ref 80.0–100.0)
Platelets: 225 10*3/uL (ref 150–400)
RBC: 1.64 MIL/uL — ABNORMAL LOW (ref 3.87–5.11)
RDW: 15.1 % (ref 11.5–15.5)
WBC: 13.9 10*3/uL — ABNORMAL HIGH (ref 4.0–10.5)
nRBC: 0.4 % — ABNORMAL HIGH (ref 0.0–0.2)

## 2019-08-02 LAB — PREPARE RBC (CROSSMATCH)

## 2019-08-02 LAB — POC OCCULT BLOOD, ED: Fecal Occult Bld: POSITIVE — AB

## 2019-08-02 MED ORDER — SODIUM CHLORIDE 0.9 % IV SOLN
INTRAVENOUS | Status: DC
  Administered 2019-08-03: 04:00:00 via INTRAVENOUS

## 2019-08-02 MED ORDER — ACETAMINOPHEN 325 MG PO TABS
650.0000 mg | ORAL_TABLET | Freq: Four times a day (QID) | ORAL | Status: DC | PRN
  Filled 2019-08-02: qty 2

## 2019-08-02 MED ORDER — IOHEXOL 300 MG/ML  SOLN
100.0000 mL | Freq: Once | INTRAMUSCULAR | Status: AC | PRN
  Administered 2019-08-02: 21:00:00 100 mL via INTRAVENOUS

## 2019-08-02 MED ORDER — SODIUM CHLORIDE 0.9 % IV SOLN: 10.0000 mL/h | Freq: Once | INTRAVENOUS | Status: DC

## 2019-08-02 MED ORDER — SODIUM CHLORIDE 0.9 % IV SOLN
10.0000 mL/h | Freq: Once | INTRAVENOUS | Status: AC
  Administered 2019-08-02: 22:00:00 10 mL/h via INTRAVENOUS

## 2019-08-02 MED ORDER — ACETAMINOPHEN 650 MG RE SUPP: 650.0000 mg | Freq: Four times a day (QID) | RECTAL | Status: DC | PRN

## 2019-08-02 MED ORDER — POTASSIUM CHLORIDE 10 MEQ/100ML IV SOLN
10.0000 meq | INTRAVENOUS | Status: AC
  Administered 2019-08-03: 04:00:00 10 meq via INTRAVENOUS
  Filled 2019-08-02: qty 100

## 2019-08-02 MED ORDER — VITAMIN K1 10 MG/ML IJ SOLN
1.0000 mg | Freq: Once | INTRAVENOUS | Status: AC
  Administered 2019-08-03: 1 mg via INTRAVENOUS
  Filled 2019-08-02: qty 0.1

## 2019-08-02 MED ORDER — INSULIN ASPART 100 UNIT/ML ~~LOC~~ SOLN
0.0000 [IU] | SUBCUTANEOUS | Status: DC
  Administered 2019-08-03: 14:00:00 1 [IU] via SUBCUTANEOUS
  Administered 2019-08-03: 3 [IU] via SUBCUTANEOUS
  Administered 2019-08-03 (×2): 2 [IU] via SUBCUTANEOUS
  Administered 2019-08-03 – 2019-08-04 (×2): 1 [IU] via SUBCUTANEOUS
  Administered 2019-08-04 (×2): 2 [IU] via SUBCUTANEOUS
  Administered 2019-08-05: 21:00:00 1 [IU] via SUBCUTANEOUS
  Administered 2019-08-05 (×2): 2 [IU] via SUBCUTANEOUS
  Administered 2019-08-06 (×3): 1 [IU] via SUBCUTANEOUS
  Administered 2019-08-07 – 2019-08-08 (×3): 2 [IU] via SUBCUTANEOUS
  Administered 2019-08-08: 1 [IU] via SUBCUTANEOUS

## 2019-08-02 NOTE — Telephone Encounter (Signed)
Pt. Reported onset of blood in stool 11/19; described stool as formed, maroon in color, with bright red color to the edged of the toilet water.  Stated stool is a little loose at end of BM.  On Warfarin; last dose taken 11/19 @ supper.  Denied abd. Or rectal pain.  Denied n/v, fever/ chills.  Stated she "felt a little weak and woozy yesterday", but is better today.  Advised to go to ER at this time.  Advised another adult should drive.  Pt. Verb. Understanding.   Reason for Disposition . Taking Coumadin (warfarin) or other strong blood thinner, or known bleeding disorder (e.g., thrombocytopenia)  Answer Assessment - Initial Assessment Questions 1. APPEARANCE of BLOOD: "What color is it?" "Is it passed separately, on the surface of the stool, or mixed in with the stool?"      Maroon color to stool with red in water 2. AMOUNT: "How much blood was passed?"      Formed stool maroon and bright red in the water; small clots with wiping  3. FREQUENCY: "How many times has blood been passed with the stools?"      Once each day x 2 days 4. ONSET: "When was the blood first seen in the stools?" (Days or weeks)      2 days ago  5. DIARRHEA: "Is there also some diarrhea?" If so, ask: "How many diarrhea stools were passed in past 24 hours?"     Passing formed stool with loose stool at end of BM  6. CONSTIPATION: "Do you have constipation?" If so, "How bad is it?"     Denied 7. RECURRENT SYMPTOMS: "Have you had blood in your stools before?" If so, ask: "When was the last time?" and "What happened that time?"      Denied  8. BLOOD THINNERS: "Do you take any blood thinners?" (e.g., Coumadin/warfarin, Pradaxa/dabigatran, aspirin)     Warfarin 2.5 mg. ; last dose 11/19 at supper 9. OTHER SYMPTOMS: "Do you have any other symptoms?"  (e.g., abdominal pain, vomiting, dizziness, fever)     Did not feel good yesterday-"weak and and a little woozy"; denied pain, denied N/V; denied fever/ chills.  10. PREGNANCY: "Is  there any chance you are pregnant?" "When was your last menstrual period  n/a  Protocols used: RECTAL BLEEDING-A-AH

## 2019-08-02 NOTE — ED Provider Notes (Signed)
Emergency Department Provider Note   I have reviewed the triage vital signs and the nursing notes.   HISTORY  Chief Complaint Shortness of Breath and Rectal Bleeding   HPI Sue [name] is a 67 y.o. female with PMH reviewed below including home O2 requirement of 3L at baseline presents to the emergency department for evaluation of rectal bleeding, left lower quadrant abdominal cramping, shortness of breath with exertion, and lightheadedness especially with standing.  Symptoms been worsening over the past several days.  Patient noticed maroon-colored bowel movements and blood in the toilet water over the last several days.  She reports becoming increasingly short of breath with walking and states that she feels like she going to pass out when she stands up.  She denies prior history of GI bleeding or requiring blood transfusions.  She was recently started on Coumadin for DVT and has been compliant with his medication.  She denies any chest pain, fevers, chills, cough, sore throat, runny nose. No known sick contacts.    Past Medical History:  Diagnosis Date   Allergic rhinitis    Depression    Emphysema of lung ([place])    3L home O2   GERD (gastroesophageal reflux disease)    Hypertension    Hypothyroidism    Obesity, morbid, BMI 50 or higher ([place])    Stroke ([place]) 2016   TIA    Urine incontinence     Patient Active Problem List   Diagnosis Date Noted   Symptomatic anemia 08/03/2019   QT prolongation 08/03/2019   GI bleed 08/02/2019   Diabetes mellitus type II, uncontrolled ([place]) 05/29/2019   Hyperlipidemia associated with type 2 diabetes mellitus ([place]) 05/29/2019   DVT (deep venous thrombosis) ([place]) 03/17/2019   Acute on chronic renal insufficiency 03/17/2019   Macrocytic anemia 03/17/2019   Small bowel obstruction ([place]) 03/08/2019   Fracture of left tibia 02/25/2019   Left tibial fracture 02/25/2019   Hyperglycemia 08/16/2018   Morbid obesity due to  excess calories ([place]) 05/09/2018   Acute bronchitis with COPD ([place]) 10/19/2017   Atypical chest pain 05/01/2017   Pulmonary emphysema ([place]) 04/06/2017   Chronic seasonal allergic rhinitis 04/06/2017   GERD (gastroesophageal reflux disease) 04/06/2017   Snoring 04/06/2017   Myalgia 02/01/2017   Arthralgia 02/01/2017   Chronic pain of toe of left foot 11/14/2016   Chronic hepatitis C without hepatic coma ([place]) 08/01/2016   Hypothyroidism [id number]   Mild diastolic dysfunction 0000000   Edema 01/01/2015   Edema of both ankles 12/24/2014   TIA (transient ischemic attack) 07/30/2014   Weakness 07/29/2014   Flank pain 11/12/2013   Chronic respiratory failure ([place]) 10/25/2013   DOE (dyspnea on exertion) 10/25/2013   Depression with anxiety 10/15/2013   Insomnia 10/15/2013   HTN (hypertension) 10/09/2013   Hypokalemia 10/09/2013    Past Surgical History:  Procedure Laterality Date   ABDOMINAL HYSTERECTOMY     CESAREAN SECTION      Allergies Hydrocodone and Norvasc [amlodipine besylate]  Family History  Problem Relation Age of Onset   Heart disease Father        MVP and Pics Valve   Hypertension Father    Depression Father        Institutionalized x's 2 years   Bipolar disorder Father    Hypertension Sister    Diabetes Sister    Hyperlipidemia Sister    Heart disease Sister 30       MI   Heart disease Brother  Hypertension Brother    Heart disease Paternal Grandmother    Heart disease Paternal Aunt    Heart disease Paternal Uncle    Schizophrenia Paternal Aunt    Asthma Son    Asthma Son     Social History Social History   Tobacco Use   Smoking status: Former Smoker    Packs/day: 1.00    Years: 40.00    Pack years: 40.00    Types: Cigarettes    Start date: 07/21/1972    Quit date: 10/16/2011    Years since quitting: 7.8   Smokeless tobacco: Never Used  Substance Use Topics   Alcohol use: Not Currently     Comment: Occ-- Wine   Drug use: No    Review of Systems  Constitutional: No fever/chills. Positive generalized weakness.  Eyes: No visual changes. ENT: No sore throat. Cardiovascular: Denies chest pain. Positive near syncope.  Respiratory: Positive shortness of breath. Gastrointestinal: Positive LLQ cramping abdominal pain.  No nausea, no vomiting.  No diarrhea.  No constipation. Positive rectal bleeding.  Genitourinary: Negative for dysuria. Musculoskeletal: Negative for back pain. Skin: Negative for rash. Neurological: Negative for headaches, focal weakness or numbness.  10-point ROS otherwise negative.  ____________________________________________   PHYSICAL EXAM:  VITAL SIGNS: ED Triage Vitals  Enc Vitals Group     BP 08/02/19 1654 (!) 131/56     Pulse Rate 08/02/19 1654 94     Resp 08/02/19 1654 (!) 22     Temp 08/02/19 1654 98.6 F (37 C)     Temp Source 08/02/19 1654 Oral     SpO2 08/02/19 1654 98 %     Weight 08/02/19 1656 297 lb (134.7 kg)     Height 08/02/19 1656 5\' 3"  (1.6 m)   Constitutional: Alert and oriented. Well appearing and in no acute distress. Eyes: Conjunctivae are normal. Head: Atraumatic. Nose: No congestion/rhinnorhea. Mouth/Throat: Mucous membranes are moist.  Neck: No stridor.   Cardiovascular: Normal rate, regular rhythm. Good peripheral circulation. Grossly normal heart sounds.   Respiratory: Increased respiratory effort.  No retractions. Lungs CTAB. Gastrointestinal: Soft with mild LLQ tenderness. No rebound or guarding. No distention.  Rectal exam performed with verbal consent and nurse chaperone.  No fissures or external hemorrhoids.  Maroon-colored stool on DRE without palpable mass. No melena.  Musculoskeletal: No lower extremity tenderness nor edema. No gross deformities of extremities. Neurologic:  Normal speech and language.  Skin:  Skin is warm, dry and intact. No rash noted.   ____________________________________________     LABS (all labs ordered are listed, but only abnormal results are displayed)  Labs Reviewed  COMPREHENSIVE METABOLIC PANEL - Abnormal; Notable for the following components:      Result Value   Potassium 3.4 (*)    Glucose, Bld 186 (*)    BUN 33 (*)    Creatinine, Ser 1.59 (*)    Calcium 7.8 (*)    Albumin 3.1 (*)    Alkaline Phosphatase 127 (*)    GFR calc non Af Amer 33 (*)    GFR calc Af Amer 39 (*)    All other components within normal limits  CBC - Abnormal; Notable for the following components:   WBC 13.9 (*)    RBC 1.64 (*)    Hemoglobin 4.6 (*)    HCT 15.2 (*)    nRBC 0.4 (*)    All other components within normal limits  PROTIME-INR - Abnormal; Notable for the following components:   Prothrombin Time 25.7 (*)  INR 2.4 (*)    All other components within normal limits  MAGNESIUM - Abnormal; Notable for the following components:   Magnesium 1.2 (*)    All other components within normal limits  POC OCCULT BLOOD, ED - Abnormal; Notable for the following components:   Fecal Occult Bld POSITIVE (*)    All other components within normal limits  SARS CORONAVIRUS 2 (TAT 6-24 HRS)  HEMOGLOBIN A1C  CBC  BASIC METABOLIC PANEL  PROTIME-INR  TYPE AND SCREEN  PREPARE RBC (CROSSMATCH)  ABO/RH  PREPARE FRESH FROZEN PLASMA   ____________________________________________  EKG   EKG Interpretation  Date/Time:  Friday August 02 2019 16:48:21 EST Ventricular Rate:  95 PR Interval:    QRS Duration: 95 QT Interval:  418 QTC Calculation: 526 R Axis:   60 Text Interpretation: Sinus rhythm Low voltage, precordial leads Abnormal R-wave progression, early transition Prolonged QT interval Baseline wander in lead(s) V2 V4 V5 V6 No STEMI Confirmed by Nanda Quinton [phone]) on 08/02/2019 4:51:33 PM       ____________________________________________  RADIOLOGY  Dg Chest 2 View  Result Date: 08/02/2019 CLINICAL DATA:  Shortness of breath. EXAM: CHEST - 2 VIEW COMPARISON:   03/11/2019 FINDINGS: Both views are degraded by patient body habitus. Lateral view degraded by patient arm position. Numerous leads and wires project over the chest. Midline trachea. Normal heart size. Atherosclerosis in the transverse aorta. No pleural effusion or pneumothorax. No congestive failure. Clear lungs. IMPRESSION: No acute cardiopulmonary disease. Aortic Atherosclerosis (ICD10-I70.0). Electronically Signed   By: Abigail Miyamoto M.D.   On: 08/02/2019 18:26   Ct Abdomen Pelvis W Contrast  Result Date: 08/02/2019 CLINICAL DATA:  Abdominal pain, rectal bleeding EXAM: CT ABDOMEN AND PELVIS WITH CONTRAST TECHNIQUE: Multidetector CT imaging of the abdomen and pelvis was performed using the standard protocol following bolus administration of intravenous contrast. CONTRAST:  160mL OMNIPAQUE IOHEXOL 300 MG/ML  SOLN COMPARISON:  03/08/2019 FINDINGS: Lower chest: Incompletely visualized 5 mm nodular density within the superior aspect of the right lower lobe (series 5, image 1). Lung bases otherwise clear. Hepatobiliary: No focal liver abnormality is seen. Multiple calcified stones within the gallbladder lumen. No evidence of gallbladder wall thickening, pericholecystic inflammation, or biliary dilatation. Pancreas: Unremarkable. No pancreatic ductal dilatation or surrounding inflammatory changes. Spleen: Normal in size without focal abnormality. Adrenals/Urinary Tract: Adrenal glands are unremarkable. Kidneys are normal, without renal calculi, focal lesion, or hydronephrosis. Bladder is unremarkable. Stomach/Bowel: Stomach is within normal limits. Appendix appears normal. There are a few scattered diverticula of the sigmoid colon. No evidence of bowel wall thickening, distention, or inflammatory changes. Vascular/Lymphatic: Aortic atherosclerosis. Incidental note of retroaortic left renal vein. No enlarged abdominal or pelvic lymph nodes. Reproductive: Status post hysterectomy. No adnexal masses. Other: No free  fluid within the abdomen or pelvis. Mild rectus diastasis. Musculoskeletal: No acute osseous abnormality. Chronic grade 1 anterolisthesis L4 on L5. IMPRESSION: 1. No acute abdominopelvic findings. 2. Uncomplicated cholelithiasis. 3. Mild sigmoid diverticulosis without acute diverticulitis. 4. Partially visualized 5 mm right lower lobe pulmonary nodule. No follow-up needed if patient is low-risk. Non-contrast chest CT can be considered in 12 months if patient is high-risk. This recommendation follows the consensus statement: Guidelines for Management of Incidental Pulmonary Nodules Detected on CT Images: From the Fleischner Society 2017; Radiology 2017; [id number]:[id number]. Aortic Atherosclerosis (ICD10-I70.0). Electronically Signed   By: Davina Poke M.D.   On: 08/02/2019 21:32    ____________________________________________   PROCEDURES  Procedure(s) performed:   Procedures  CRITICAL CARE Performed  by: Margette Fast Total critical care time: 45 minutes Critical care time was exclusive of separately billable procedures and treating other patients. Critical care was necessary to treat or prevent imminent or life-threatening deterioration. Critical care was time spent personally by me on the following activities: development of treatment plan with patient and/or surrogate as well as nursing, discussions with consultants, evaluation of patient's response to treatment, examination of patient, obtaining history from patient or surrogate, ordering and performing treatments and interventions, ordering and review of laboratory studies, ordering and review of radiographic studies, pulse oximetry and re-evaluation of patient's condition.  Nanda Quinton, MD Emergency Medicine  ____________________________________________   INITIAL IMPRESSION / ASSESSMENT AND PLAN / ED COURSE  Pertinent labs & imaging results that were available during my care of the patient were reviewed by me and considered in my medical  decision making (see chart for details).   Patient presents to the emergency department with rectal bleeding, abdominal cramping, shortness of breath, near syncope.  She is afebrile here with normal oxygen saturation on her baseline 3 L but does become short of breath with standing at bedside.  She reports feeling lightheaded.  Question symptomatic anemia vs primary cardiorespiratory etiology.   07:15 PM  Hemoglobin resulted at 4.6.  I consented the patient to receive PRBC.  Change to the CT scan to an angio to further evaluate for possible diverticular bleeding.  Patient remains hemodynamically stable but symptomatic.  She has prolonged QT on the EKG and not the Protonix.  No outpatient GI doctor.   10:10 PM  Spoke with [facility], Dr. Therisa Doyne.  Plan for warfarin reversal with INR and FFP.  Will send patient for nuclear medicine scan STAT as well. Patient updated. Vitals remain WNL. Patient awake and alert.   Discussed patient's case with [facility], Dr. Marlowe Sax to request admission. Patient and family (if present) updated with plan. Care transferred to [facility] service. Discussed pending NM study and GI recommendations.   I reviewed all nursing notes, vitals, pertinent old records, EKGs, labs, imaging (as available).  ____________________________________________  FINAL CLINICAL IMPRESSION(S) / ED DIAGNOSES  Final diagnoses:  Acute blood loss anemia  Gastrointestinal hemorrhage, unspecified gastrointestinal hemorrhage type  Prolonged Q-T interval on ECG    MEDICATIONS GIVEN DURING THIS VISIT:  Medications  0.9 %  sodium chloride infusion (has no administration in time range)  phytonadione (VITAMIN K) 1 mg in dextrose 5 % 50 mL IVPB (has no administration in time range)  acetaminophen (TYLENOL) tablet 650 mg (has no administration in time range)    Or  acetaminophen (TYLENOL) suppository 650 mg (has no administration in time range)  0.9 %  sodium chloride infusion (has no administration in time  range)  insulin aspart (novoLOG) injection 0-9 Units (has no administration in time range)  potassium chloride 10 mEq in 100 mL IVPB (has no administration in time range)  0.9 %  sodium chloride infusion (10 mL/hr Intravenous New Bag/Given 08/02/19 2210)  iohexol (OMNIPAQUE) 300 MG/ML solution 100 mL (100 mLs Intravenous Contrast Given 08/02/19 2102)    Note:  This document was prepared using Dragon voice recognition software and may include unintentional dictation errors.  Nanda Quinton, MD, [facility] Emergency Medicine    Candelaria Pies, [facility], MD 08/03/19 [phone]

## 2019-08-02 NOTE — ED Triage Notes (Signed)
Patient complaining of sob and rectal bleeding. Patient is normally on 3L [place] at home. Patient O2 Sat -93%. Patient states this started today.

## 2019-08-02 NOTE — ED Notes (Signed)
CRITICAL VALUE STICKER  CRITICAL VALUE: Hgb 4.3  RECEIVER (on-site recipient of call): Maylon Cos T RN  DATE & TIME NOTIFIED: 08/02/19 714p  MESSENGER (representative from lab): Ovid Curd  MD NOTIFIED: Long MD  TIME OF NOTIFICATION: [id number]  RESPONSE: see orders

## 2019-08-02 NOTE — H&P (Signed)
History and Physical    Sue [name] GTX:[id number] DOB: February 16, 1951 DOA: 08/02/2019  PCP: Ann Held, DO Patient coming from: Home  Chief Complaint: Shortness breath, rectal bleeding  HPI: Sue [name] is a 68 y.o. female with medical history significant of recent DVT on Coumadin, emphysema and chronic hypoxic respiratory failure on 3 L home oxygen, insulin-dependent diabetes mellitus, hypertension, hypothyroidism, GERD, morbid obesity (BMI 52.61), CVA presenting with complaints of shortness of breath and rectal bleeding.  Patient states for the past few days she has had large-volume maroon-colored stools.  Most recent episode was yesterday morning.  No hematemesis.  She has had increasing shortness of breath and fatigue.  She gets very lightheaded and feels like she is going to blackout every time she stands up.  Denies history of prior GI bleed.  States back in June this year she fractured her left leg in several places and was diagnosed with DVT in this leg soon after.  She has been on Coumadin since then.  She has never had a EGD or colonoscopy done.  ED Course: Hemodynamically stable.  No significant change in oxygen requirement from baseline.  Hemoglobin 4.6, baseline in the 9-10 range.  FOBT positive.  INR 2.4.  Chest x-ray showing no acute cardiopulmonary disease.  CT showing no acute abdominal pelvic findings.  Mild sigmoid diverticulosis without acute diverticulitis.  ED provider discussed the case with Dr. Therisa Doyne from [place].  Recommended obtaining nuclear medicine bleed study and INR reversal.  IV vitamin K, 1 unit FFP, and 3 units PRBCs ordered.  Review of Systems:  All systems reviewed and apart from history of presenting illness, are negative.  Past Medical History:  Diagnosis Date   Allergic rhinitis    Depression    Emphysema of lung (HCC)    3L home O2   GERD (gastroesophageal reflux disease)    Hypertension    Hypothyroidism    Obesity, morbid, BMI  50 or higher ([place])    Stroke ([place]) 2016   TIA    Urine incontinence     Past Surgical History:  Procedure Laterality Date   ABDOMINAL HYSTERECTOMY     CESAREAN SECTION       reports that she quit smoking about 7 years ago. Her smoking use included cigarettes. She started smoking about 47 years ago. She has a 40.00 pack-year smoking history. She has never used smokeless tobacco. She reports previous alcohol use. She reports that she does not use drugs.  Allergies  Allergen Reactions   Hydrocodone Other (See Comments)    Constipation, hallucinations   Norvasc [Amlodipine Besylate]     Marked swelling    Family History  Problem Relation Age of Onset   Heart disease Father        MVP and Pics Valve   Hypertension Father    Depression Father        Institutionalized x's 2 years   Bipolar disorder Father    Hypertension Sister    Diabetes Sister    Hyperlipidemia Sister    Heart disease Sister 74       MI   Heart disease Brother    Hypertension Brother    Heart disease Paternal Grandmother    Heart disease Paternal Aunt    Heart disease Paternal Uncle    Schizophrenia Paternal 49    Asthma Son    Asthma Son     Prior to Admission medications   Medication Sig Start Date End Date Taking?  Authorizing Provider  albuterol (PROVENTIL HFA;VENTOLIN HFA) 108 (90 Base) MCG/ACT inhaler Inhale 1-2 puffs into the lungs every 6 (six) hours as needed for wheezing or shortness of breath.   Yes [provider]  albuterol (PROVENTIL) (2.5 MG/3ML) 0.083% nebulizer solution Take 3 mLs (2.5 mg total) by nebulization every 6 (six) hours as needed for wheezing or shortness of breath. 03/31/17  Yes Carollee Herter, Yvonne R, DO  busPIRone (BUSPAR) 15 MG tablet Take 15 mg by mouth daily.  12/27/17  Yes [provider]  famotidine (PEPCID) 20 MG tablet Take 20 mg by mouth daily as needed for heartburn or indigestion.   Yes [provider]  fluticasone  (FLONASE) 50 MCG/ACT nasal spray Place 2 sprays into both nostrils daily as needed for allergies. 05/02/17  Yes Johnson, Clanford L, MD  glipiZIDE (GLUCOTROL) 5 MG tablet Take 1 tablet (5 mg total) by mouth 2 (two) times daily before a meal. 05/29/19  Yes Lowne Chase, Yvonne R, DO  insulin glargine (LANTUS) 100 UNIT/ML injection Inject 0.3 mLs (30 Units total) into the skin at bedtime. 12/06/18  Yes Ann Held, DO  levothyroxine (SYNTHROID) 50 MCG tablet Take 1 tablet by mouth once daily 07/01/19  Yes Lowne Lyndal Pulley R, DO  lisinopril (ZESTRIL) 20 MG tablet Take 1 tablet by mouth once daily Patient taking differently: Take 20 mg by mouth daily.  05/03/19  Yes Roma Schanz R, DO  LORazepam (ATIVAN) 1 MG tablet Take 0.5-1.5 mg by mouth See admin instructions. Take 0.59m by mouth in the morning and 1.54mby mouth at bedtime as needed for anxiety.   Yes [provider]  metFORMIN (GLUCOPHAGE) 500 MG tablet Take 2 tablets (1,000 mg total) by mouth 2 (two) times daily with a meal. 08/18/18 08/18/19 Yes Danford, ChSuann LarryMD  metoprolol succinate (TOPROL-XL) 50 MG 24 hr tablet TAKE 2 TABLETS BY MOUTH ONCE DAILY IN THE MORNING WITH A MEAL OR IMMEDIATELY FOLLOWING A MEAL Patient taking differently: Take 100 mg by mouth daily.  01/03/19  Yes LoRoma Schanz, DO  potassium chloride SA (KLOR-CON) 20 MEQ tablet Take 1 tablet by mouth once daily 06/20/19  Yes Lowne Chase, Yvonne R, DO  QUEtiapine (SEROQUEL) 100 MG tablet Take 100 mg by mouth at bedtime. 02/23/19  Yes [provider]  torsemide (DEMADEX) 20 MG tablet Take 2 tablets by mouth once daily 07/15/19  Yes LoRoma Schanz, DO  warfarin (COUMADIN) 2.5 MG tablet Take 1 tablet (2.5 mg total) by mouth daily. 05/28/19  Yes LoRoma Schanz, DO  zolpidem (AMBIEN) 5 MG tablet Take 2 tablets (10 mg total) by mouth at bedtime. Patient taking differently: Take 5 mg by mouth at bedtime.  05/02/17  Yes Johnson, ClLynndylMD  Accu-Chek FastClix Lancets MISC USE TO CHECK BLOOD SUGAR UP TO FOUR TIMES DAILY AS DIRECTED 07/15/19   LoCarollee HerterYvAlferd ApaDO  ACCU-CHEK GUIDE test strip USE TO CHECK BLOOD SUGAR UP TO FOUR TIMES DAILY 05/28/19   LoCarollee HerterYvAlferd ApaDO  blood glucose meter kit and supplies KIT Dispense based on patient and insurance preference. Use up to four times daily as directed. (FOR ICD-9 250.00, 250.01). 10/04/18   LoCarollee HerterYvAlferd ApaDO  enoxaparin (LOVENOX) 150 MG/ML injection Inject 0.93 mLs (140 mg total) into the skin every 12 (twelve) hours for 10 days. Patient not taking: Reported on 04/02/2019 03/12/19 03/22/19  EzAlma FriendlyMD  Insulin Syringe-Needle  U-100 28G X 5/16" 0.5 ML MISC Use with lantus once a day 12/06/18   Carollee Herter, Alferd Apa, DO  methocarbamol (ROBAXIN) 500 MG tablet Take 1 tablet (500 mg total) by mouth every 6 (six) hours as needed for muscle spasms. Patient not taking: Reported on 08/02/2019 02/26/19   Radene Gunning, NP  methocarbamol (ROBAXIN) 500 MG tablet Take 1 tablet by mouth 4 times daily Patient not taking: Reported on 08/02/2019 04/15/19   Ann Held, DO  NONFORMULARY OR COMPOUNDED ITEM Pt/ inr   Dx dvt   Tomorrow 04/05/2019  Please call office [phone] with results 04/04/19   Carollee Herter, Alferd Apa, DO  NONFORMULARY OR COMPOUNDED ITEM PT/ inr    Dx dvt 04/08/19   Carollee Herter, Alferd Apa, DO  NONFORMULARY OR COMPOUNDED ITEM Compression stockings  20-30 mm/hg  #1   Dx low ext edema 05/28/19   Carollee Herter, Yvonne R, DO  ondansetron (ZOFRAN) 4 MG tablet Take 1 tablet (4 mg total) by mouth every 8 (eight) hours as needed for nausea or vomiting. Patient not taking: Reported on 08/02/2019 03/05/19   Roma Schanz R, DO  OXYGEN Inhale 3 L into the lungs continuous.     [provider]  warfarin (COUMADIN) 5 MG tablet Take 0.5 tablets (2.5 mg total) by mouth daily at 6 PM. 05/16/19 06/15/19  Ann Held, DO    Physical Exam: Vitals:    08/02/19 2208 08/02/19 2215 08/02/19 2230 08/02/19 2340  BP: (!) 102/59 (!) 106/55 116/60 136/68  Pulse: 89 93 92 93  Resp: (!) '21 19 18 ' (!) 32  Temp: 98.8 F (37.1 C)   98.3 F (36.8 C)  TempSrc: Oral   Oral  SpO2: 100% 100% 100% 100%  Weight:      Height:        Physical Exam  Constitutional: She is oriented to person, place, and time. She appears well-developed and well-nourished. No distress.  HENT:  Head: Normocephalic.  Eyes: Right eye exhibits no discharge. Left eye exhibits no discharge.  Neck: Neck supple.  Cardiovascular: Normal rate, regular rhythm and intact distal pulses.  Pulmonary/Chest: Effort normal and breath sounds normal. No respiratory distress. She has no wheezes. She has no rales.  On 4 L supplemental oxygen  Abdominal: Soft. Bowel sounds are normal. She exhibits no distension. There is no abdominal tenderness. There is no guarding.  Musculoskeletal:     Comments: Trace pedal edema  Neurological: She is alert and oriented to person, place, and time.  Skin: Skin is warm and dry. She is not diaphoretic.  Psychiatric: She has a normal mood and affect. Her behavior is normal.     Labs on Admission: I have personally reviewed following labs and imaging studies  CBC: Recent Labs  Lab 08/02/19 1837  WBC 13.9*  HGB 4.6*  HCT 15.2*  MCV 92.7  PLT 539   Basic Metabolic Panel: Recent Labs  Lab 08/02/19 1837  NA 140  K 3.4*  CL 101  CO2 30  GLUCOSE 186*  BUN 33*  CREATININE 1.59*  CALCIUM 7.8*   GFR: Estimated Creatinine Clearance: 46.2 mL/min (A) (by C-G formula based on SCr of 1.59 mg/dL (H)). Liver Function Tests: Recent Labs  Lab 08/02/19 1837  AST 21  ALT 12  ALKPHOS 127*  BILITOT 0.6  PROT 7.4  ALBUMIN 3.1*   No results for input(s): LIPASE, AMYLASE in the last 168 hours. No results for input(s): AMMONIA in the  last 168 hours. Coagulation Profile: Recent Labs  Lab 08/02/19 1837  INR 2.4*   Cardiac Enzymes: No results for  input(s): CKTOTAL, CKMB, CKMBINDEX, TROPONINI in the last 168 hours. BNP (last 3 results) No results for input(s): PROBNP in the last 8760 hours. HbA1C: No results for input(s): HGBA1C in the last 72 hours. CBG: No results for input(s): GLUCAP in the last 168 hours. Lipid Profile: No results for input(s): CHOL, HDL, LDLCALC, TRIG, CHOLHDL, LDLDIRECT in the last 72 hours. Thyroid Function Tests: No results for input(s): TSH, T4TOTAL, FREET4, T3FREE, THYROIDAB in the last 72 hours. Anemia Panel: No results for input(s): VITAMINB12, FOLATE, FERRITIN, TIBC, IRON, RETICCTPCT in the last 72 hours. Urine analysis:    Component Value Date/Time   COLORURINE YELLOW 08/15/2018 2201   APPEARANCEUR CLEAR 08/15/2018 2201   LABSPEC <1.005 (L) 08/15/2018 2201   PHURINE 5.5 08/15/2018 2201   GLUCOSEU >=500 (A) 08/15/2018 2201   GLUCOSEU NEGATIVE 11/11/2013 1353   HGBUR NEGATIVE 08/15/2018 2201   BILIRUBINUR NEGATIVE 08/15/2018 2201   BILIRUBINUR neg 10/27/2015 1355   KETONESUR NEGATIVE 08/15/2018 2201   PROTEINUR NEGATIVE 08/15/2018 2201   UROBILINOGEN 0.2 10/27/2015 1355   UROBILINOGEN 0.2 07/29/2014 2300   NITRITE NEGATIVE 08/15/2018 2201   LEUKOCYTESUR NEGATIVE 08/15/2018 2201    Radiological Exams on Admission: Dg Chest 2 View  Result Date: 08/02/2019 CLINICAL DATA:  Shortness of breath. EXAM: CHEST - 2 VIEW COMPARISON:  03/11/2019 FINDINGS: Both views are degraded by patient body habitus. Lateral view degraded by patient arm position. Numerous leads and wires project over the chest. Midline trachea. Normal heart size. Atherosclerosis in the transverse aorta. No pleural effusion or pneumothorax. No congestive failure. Clear lungs. IMPRESSION: No acute cardiopulmonary disease. Aortic Atherosclerosis (ICD10-I70.0). Electronically Signed   By: Abigail Miyamoto M.D.   On: 08/02/2019 18:26   Ct Abdomen Pelvis W Contrast  Result Date: 08/02/2019 CLINICAL DATA:  Abdominal pain, rectal bleeding  EXAM: CT ABDOMEN AND PELVIS WITH CONTRAST TECHNIQUE: Multidetector CT imaging of the abdomen and pelvis was performed using the standard protocol following bolus administration of intravenous contrast. CONTRAST:  173m OMNIPAQUE IOHEXOL 300 MG/ML  SOLN COMPARISON:  03/08/2019 FINDINGS: Lower chest: Incompletely visualized 5 mm nodular density within the superior aspect of the right lower lobe (series 5, image 1). Lung bases otherwise clear. Hepatobiliary: No focal liver abnormality is seen. Multiple calcified stones within the gallbladder lumen. No evidence of gallbladder wall thickening, pericholecystic inflammation, or biliary dilatation. Pancreas: Unremarkable. No pancreatic ductal dilatation or surrounding inflammatory changes. Spleen: Normal in size without focal abnormality. Adrenals/Urinary Tract: Adrenal glands are unremarkable. Kidneys are normal, without renal calculi, focal lesion, or hydronephrosis. Bladder is unremarkable. Stomach/Bowel: Stomach is within normal limits. Appendix appears normal. There are a few scattered diverticula of the sigmoid colon. No evidence of bowel wall thickening, distention, or inflammatory changes. Vascular/Lymphatic: Aortic atherosclerosis. Incidental note of retroaortic left renal vein. No enlarged abdominal or pelvic lymph nodes. Reproductive: Status post hysterectomy. No adnexal masses. Other: No free fluid within the abdomen or pelvis. Mild rectus diastasis. Musculoskeletal: No acute osseous abnormality. Chronic grade 1 anterolisthesis L4 on L5. IMPRESSION: 1. No acute abdominopelvic findings. 2. Uncomplicated cholelithiasis. 3. Mild sigmoid diverticulosis without acute diverticulitis. 4. Partially visualized 5 mm right lower lobe pulmonary nodule. No follow-up needed if patient is low-risk. Non-contrast chest CT can be considered in 12 months if patient is high-risk. This recommendation follows the consensus statement: Guidelines for Management of Incidental Pulmonary  Nodules Detected on CT  Images: From the Fleischner Society 2017; Radiology 2017; [phone]. Aortic Atherosclerosis (ICD10-I70.0). Electronically Signed   By: Davina Poke M.D.   On: 08/02/2019 21:32    EKG: Independently reviewed.  Sinus rhythm, QTC 526.  QT interval increased since prior tracing.  Assessment/Plan Principal Problem:   GI bleed Active Problems:   DVT (deep venous thrombosis) (HCC)   Diabetes mellitus type II, uncontrolled (HCC)   Symptomatic anemia   QT prolongation   Acute GI bleed, symptomatic severe acute blood loss anemia Possibly diverticular.  CT showing mild sigmoid diverticulosis.  No prior EGD or colonoscopy done.  Does take Coumadin for history of DVT.  Hemodynamically stable.  Hemoglobin 4.6, baseline in the 9-10 range.  FOBT positive.  INR 2.4.  No episodes of hematochezia since she has been in the ED. ED provider discussed the case with Dr. Therisa Doyne from [place].  Recommended obtaining nuclear medicine bleed study and INR reversal.  -Monitor hemodynamics -Type and screen -3 units PRBCs -Posttransfusion H&H -IV fluid hydration -PPI cannot be ordered due to significant QT prolongation -Hold Coumadin -1 unit FFP and IV vitamin K for INR reversal.  Continue to monitor INR. -Nuclear medicine GI blood loss study -Keep n.p.o.  QT prolongation on EKG -Cardiac monitoring -Keep potassium above 4 magnesium above 2 -Does take psychotropic and antiemetic medications at home.  Hold QT prolonging drugs at this time. -Repeat EKG in a.m.  Mild leukocytosis Likely reactive.  WBC count 13.9.  Patient is afebrile.  X-ray not suggestive of pneumonia.  Not endorsing any UTI symptoms. -Continue to monitor CBC  Mild hypokalemia Potassium 3.4. -Replete potassium.  Check magnesium level.  Continue to monitor BMP.  Provoked left lower extremity DVT diagnosed June 2020 Secondary to fracture and poor mobility. -Hold Coumadin at this time given acute GI bleed.   Medications have been given for INR reversal.  Emphysema and chronic hypoxic respiratory failure Uses 3 L supplemental oxygen at home 24 hours.  Increased to 4 L in the ED.  Has worsening dyspnea due to acute blood loss anemia.  Lungs clear on auscultation.  Chest x-ray negative. -Continuous pulse ox -Continue supplemental oxygen  Insulin-dependent diabetes mellitus -Check A1c.  Sliding scale insulin and CBG checks every 4 hours as patient is n.p.o.  Pulmonary nodule CT showing partially visualized 5 mm right lower lobe pulmonary nodule.  Patient is a former smoker with a 40-pack-year smoking history. -Repeat noncontrast chest CT will be needed in 12 months for follow-up  DVT prophylaxis: No anticoagulation due to active GI bleed.  No SCDs due to recently diagnosed DVT. Code Status: Full code Family Communication: No family available. Disposition Plan: Anticipate discharge after clinical improvement. Consults called: GI Admission status: It is my clinical opinion that admission to INPATIENT is reasonable and necessary in this 68 y.o. female  presenting with acute GI bleed and symptomatic severe acute blood loss anemia in setting of home Coumadin use.  Patient will be seen by GI in the morning.  Receiving blood transfusions and medications for INR reversal.  High risk of decompensation.  Given the aforementioned, the predictability of an adverse outcome is felt to be significant. I expect that the patient will require at least 2 midnights in the hospital to treat this condition.   The medical decision making on this patient was of high complexity and the patient is at high risk for clinical deterioration, therefore this is a level 3 visit.  Shela Leff MD [facility] Pager [phone]  If 7PM-7AM, please contact night-coverage [email or web address] Password [facility]  08/03/2019, 12:29 AM

## 2019-08-03 DIAGNOSIS — R9431 Abnormal electrocardiogram [ECG] [EKG]: Secondary | ICD-10-CM

## 2019-08-03 DIAGNOSIS — D649 Anemia, unspecified: Secondary | ICD-10-CM

## 2019-08-03 LAB — BASIC METABOLIC PANEL
Anion gap: 10 (ref 5–15)
BUN: 24 mg/dL — ABNORMAL HIGH (ref 8–23)
CO2: 29 mmol/L (ref 22–32)
Calcium: 7.5 mg/dL — ABNORMAL LOW (ref 8.9–10.3)
Chloride: 100 mmol/L (ref 98–111)
Creatinine, Ser: 1.45 mg/dL — ABNORMAL HIGH (ref 0.44–1.00)
GFR calc Af Amer: 43 mL/min — ABNORMAL LOW (ref >60)
GFR calc non Af Amer: 37 mL/min — ABNORMAL LOW (ref >60)
Glucose, Bld: 239 mg/dL — ABNORMAL HIGH (ref 70–99)
Potassium: 3.4 mmol/L — ABNORMAL LOW (ref 3.5–5.1)
Sodium: 139 mmol/L (ref 135–145)

## 2019-08-03 LAB — CBC
HCT: 27.3 % — ABNORMAL LOW (ref 36.0–46.0)
Hemoglobin: 8.5 g/dL — ABNORMAL LOW (ref 12.0–15.0)
MCH: 28.5 pg (ref 26.0–34.0)
MCHC: 31.1 g/dL (ref 30.0–36.0)
MCV: 91.6 fL (ref 80.0–100.0)
Platelets: 192 10*3/uL (ref 150–400)
RBC: 2.98 MIL/uL — ABNORMAL LOW (ref 3.87–5.11)
RDW: 14.9 % (ref 11.5–15.5)
WBC: 8.6 10*3/uL (ref 4.0–10.5)
nRBC: 0.6 % — ABNORMAL HIGH (ref 0.0–0.2)

## 2019-08-03 LAB — GLUCOSE, CAPILLARY
Glucose-Capillary: 110 mg/dL — ABNORMAL HIGH (ref 70–99)
Glucose-Capillary: 130 mg/dL — ABNORMAL HIGH (ref 70–99)
Glucose-Capillary: 157 mg/dL — ABNORMAL HIGH (ref 70–99)
Glucose-Capillary: 172 mg/dL — ABNORMAL HIGH (ref 70–99)
Glucose-Capillary: 227 mg/dL — ABNORMAL HIGH (ref 70–99)

## 2019-08-03 LAB — PROTIME-INR
INR: 1.8 — ABNORMAL HIGH (ref 0.8–1.2)
Prothrombin Time: 20.6 seconds — ABNORMAL HIGH (ref 11.4–15.2)

## 2019-08-03 LAB — SARS CORONAVIRUS 2 (TAT 6-24 HRS): SARS Coronavirus 2: NEGATIVE

## 2019-08-03 LAB — ABO/RH: ABO/RH(D): O POS

## 2019-08-03 LAB — MAGNESIUM: Magnesium: 1.2 mg/dL — ABNORMAL LOW (ref 1.7–2.4)

## 2019-08-03 MED ORDER — POTASSIUM CHLORIDE 10 MEQ/100ML IV SOLN
10.0000 meq | Freq: Once | INTRAVENOUS | Status: AC
  Administered 2019-08-03: 10 meq via INTRAVENOUS
  Filled 2019-08-03: qty 100

## 2019-08-03 MED ORDER — LORAZEPAM 2 MG/ML IJ SOLN
0.5000 mg | Freq: Once | INTRAMUSCULAR | Status: AC
  Administered 2019-08-03: 13:00:00 0.5 mg via INTRAVENOUS
  Filled 2019-08-03: qty 1

## 2019-08-03 MED ORDER — POTASSIUM CHLORIDE 10 MEQ/100ML IV SOLN
10.0000 meq | INTRAVENOUS | Status: DC
  Filled 2019-08-03: qty 100

## 2019-08-03 MED ORDER — TECHNETIUM TC 99M-LABELED RED BLOOD CELLS IV KIT
22.2000 | PACK | Freq: Once | INTRAVENOUS | Status: AC
  Administered 2019-08-02: 13:00:00 22.2 via INTRAVENOUS

## 2019-08-03 MED ORDER — PEG 3350-KCL-NA BICARB-NACL 420 G PO SOLR
4000.0000 mL | Freq: Once | ORAL | Status: AC
  Administered 2019-08-03: 4000 mL via ORAL

## 2019-08-03 MED ORDER — POTASSIUM CHLORIDE 10 MEQ/100ML IV SOLN
10.0000 meq | INTRAVENOUS | Status: DC
  Administered 2019-08-03 (×3): 10 meq via INTRAVENOUS
  Filled 2019-08-03 (×2): qty 100

## 2019-08-03 MED ORDER — MAGNESIUM SULFATE 4 GM/100ML IV SOLN
4.0000 g | Freq: Once | INTRAVENOUS | Status: DC
  Filled 2019-08-03: qty 100

## 2019-08-03 MED ORDER — MAGNESIUM SULFATE 4 GM/100ML IV SOLN
4.0000 g | Freq: Once | INTRAVENOUS | Status: AC
  Administered 2019-08-03: 4 g via INTRAVENOUS
  Filled 2019-08-03: qty 100

## 2019-08-03 MED ORDER — FUROSEMIDE 10 MG/ML IJ SOLN
40.0000 mg | Freq: Once | INTRAMUSCULAR | Status: AC
  Administered 2019-08-03: 13:00:00 40 mg via INTRAVENOUS
  Filled 2019-08-03: qty 4

## 2019-08-03 MED ORDER — MAGNESIUM SULFATE 2 GM/50ML IV SOLN
2.0000 g | Freq: Once | INTRAVENOUS | Status: AC
  Administered 2019-08-03: 15:00:00 2 g via INTRAVENOUS
  Filled 2019-08-03: qty 50

## 2019-08-03 NOTE — Consult Note (Signed)
[facility] Consult  Referring Provider: Margette Fast, MD/ER Primary Care Physician:  Carollee Herter, Alferd Apa, DO Primary Gastroenterologist: Althia Forts  Reason for Consultation: Rectal bleeding  HPI: Sue [name] is a 68 y.o. female with past medical history of morbid obesity, DVT on Coumadin, COPD on 3 L oxygen via nasal cannula at home, hypertension, hypothyroidism, TIA and depression presented to the ER after noticing bright red blood per rectum.  Patient states she was in her usual state of health until 2 days prior to admission, she noticed that there was maroon blood with brown stools.  This continued the next day, she had 3 of such episodes on Friday she decided to proceed to the ED. he has not had further rectal bleeding since admission, however was noted to have a hemoglobin of 4.6 with orthostatic hypotension and dizziness. Patient states that normally she has a bowel movement every few days and has never noted any blood in stool or black stools. She did not have any abdominal or rectal pain. She is on Coumadin for DVT which was started in June 2020, last dose was yesterday. She has never had a colonoscopy in the past. There is no family history of colon cancer. She complains of mild acid reflux but denies difficulty swallowing, pain on swallowing, early satiety or unintentional weight loss.   Past Medical History:  Diagnosis Date  . Allergic rhinitis   . Depression   . Emphysema of lung ([place])    3L home O2  . GERD (gastroesophageal reflux disease)   . Hypertension   . Hypothyroidism   . Obesity, morbid, BMI 50 or higher ([place])   . Stroke [facility]) 2016   TIA   . Urine incontinence     Past Surgical History:  Procedure Laterality Date  . ABDOMINAL HYSTERECTOMY    . CESAREAN SECTION      Prior to Admission medications   Medication Sig Start Date End Date Taking? Authorizing Provider  albuterol (PROVENTIL HFA;VENTOLIN HFA) 108 (90 Base) MCG/ACT inhaler Inhale  1-2 puffs into the lungs every 6 (six) hours as needed for wheezing or shortness of breath.   Yes [provider]  albuterol (PROVENTIL) (2.5 MG/3ML) 0.083% nebulizer solution Take 3 mLs (2.5 mg total) by nebulization every 6 (six) hours as needed for wheezing or shortness of breath. 03/31/17  Yes Carollee Herter, Yvonne R, DO  busPIRone (BUSPAR) 15 MG tablet Take 15 mg by mouth daily.  12/27/17  Yes [provider]  famotidine (PEPCID) 20 MG tablet Take 20 mg by mouth daily as needed for heartburn or indigestion.   Yes [provider]  fluticasone (FLONASE) 50 MCG/ACT nasal spray Place 2 sprays into both nostrils daily as needed for allergies. 05/02/17  Yes Johnson, Clanford L, MD  glipiZIDE (GLUCOTROL) 5 MG tablet Take 1 tablet (5 mg total) by mouth 2 (two) times daily before a meal. 05/29/19  Yes Lowne Chase, Yvonne R, DO  insulin glargine (LANTUS) 100 UNIT/ML injection Inject 0.3 mLs (30 Units total) into the skin at bedtime. 12/06/18  Yes Ann Held, DO  levothyroxine (SYNTHROID) 50 MCG tablet Take 1 tablet by mouth once daily 07/01/19  Yes Lowne Lyndal Pulley R, DO  lisinopril (ZESTRIL) 20 MG tablet Take 1 tablet by mouth once daily Patient taking differently: Take 20 mg by mouth daily.  05/03/19  Yes Roma Schanz R, DO  LORazepam (ATIVAN) 1 MG tablet Take 0.5-1.5 mg by mouth See admin instructions. Take 0.'5mg'$  by  mouth in the morning and 1.'5mg'$  by mouth at bedtime as needed for anxiety.   Yes [provider]  metFORMIN (GLUCOPHAGE) 500 MG tablet Take 2 tablets (1,000 mg total) by mouth 2 (two) times daily with a meal. 08/18/18 08/18/19 Yes Danford, Suann Larry, MD  metoprolol succinate (TOPROL-XL) 50 MG 24 hr tablet TAKE 2 TABLETS BY MOUTH ONCE DAILY IN THE MORNING WITH A MEAL OR IMMEDIATELY FOLLOWING A MEAL Patient taking differently: Take 100 mg by mouth daily.  01/03/19  Yes Roma Schanz R, DO  potassium chloride SA (KLOR-CON) 20 MEQ tablet Take  1 tablet by mouth once daily 06/20/19  Yes Lowne Chase, Yvonne R, DO  QUEtiapine (SEROQUEL) 100 MG tablet Take 100 mg by mouth at bedtime. 02/23/19  Yes [provider]  torsemide (DEMADEX) 20 MG tablet Take 2 tablets by mouth once daily 07/15/19  Yes Roma Schanz R, DO  warfarin (COUMADIN) 2.5 MG tablet Take 1 tablet (2.5 mg total) by mouth daily. 05/28/19  Yes Roma Schanz R, DO  zolpidem (AMBIEN) 5 MG tablet Take 2 tablets (10 mg total) by mouth at bedtime. Patient taking differently: Take 5 mg by mouth at bedtime.  05/02/17  Yes Johnson, [place], MD  Accu-Chek FastClix Lancets MISC USE TO CHECK BLOOD SUGAR UP TO FOUR TIMES DAILY AS DIRECTED 07/15/19   Carollee Herter, Alferd Apa, DO  ACCU-CHEK GUIDE test strip USE TO CHECK BLOOD SUGAR UP TO FOUR TIMES DAILY 05/28/19   Carollee Herter, Alferd Apa, DO  blood glucose [name] kit and supplies KIT Dispense based on patient and insurance preference. Use up to four times daily as directed. (FOR ICD-9 250.00, 250.01). 10/04/18   Carollee Herter, Alferd Apa, DO  enoxaparin (LOVENOX) 150 MG/ML injection Inject 0.93 mLs (140 mg total) into the skin every 12 (twelve) hours for 10 days. Patient not taking: Reported on 04/02/2019 03/12/19 03/22/19  Alma Friendly, MD  Insulin Syringe-Needle U-100 28G X 5/16" 0.5 ML MISC Use with lantus once a day 12/06/18   Carollee Herter, Alferd Apa, DO  methocarbamol (ROBAXIN) 500 MG tablet Take 1 tablet (500 mg total) by mouth every 6 (six) hours as needed for muscle spasms. Patient not taking: Reported on 08/02/2019 02/26/19   Radene Gunning, NP  methocarbamol (ROBAXIN) 500 MG tablet Take 1 tablet by mouth 4 times daily Patient not taking: Reported on 08/02/2019 04/15/19   Ann Held, DO  NONFORMULARY OR COMPOUNDED ITEM Pt/ inr   Dx dvt   Tomorrow 04/05/2019  Please call office [phone] with results 04/04/19   Carollee Herter, Alferd Apa, DO  NONFORMULARY OR COMPOUNDED ITEM PT/ inr    Dx dvt 04/08/19   Carollee Herter, Alferd Apa,  DO  NONFORMULARY OR COMPOUNDED ITEM Compression stockings  20-30 mm/hg  #1   Dx low ext edema 05/28/19   Carollee Herter, Yvonne R, DO  ondansetron (ZOFRAN) 4 MG tablet Take 1 tablet (4 mg total) by mouth every 8 (eight) hours as needed for nausea or vomiting. Patient not taking: Reported on 08/02/2019 03/05/19   Roma Schanz R, DO  OXYGEN Inhale 3 L into the lungs continuous.     [provider]  warfarin (COUMADIN) 5 MG tablet Take 0.5 tablets (2.5 mg total) by mouth daily at 6 PM. 05/16/19 06/15/19  Ann Held, DO    Current Facility-Administered Medications  Medication Dose Route Frequency Provider Last Rate Last Dose  . acetaminophen (TYLENOL) tablet 650 mg  650 mg Oral Q6H PRN Shela Leff, MD       Or  . acetaminophen (TYLENOL) suppository 650 mg  650 mg Rectal Q6H PRN Shela Leff, MD      . insulin aspart (novoLOG) injection 0-9 Units  0-9 Units Subcutaneous Q4H Shela Leff, MD   1 Units at 08/03/19 0826  . magnesium sulfate IVPB 2 g 50 mL  2 g Intravenous Once Verlee Monte, MD      . magnesium sulfate IVPB 4 g 100 mL  4 g Intravenous Once Verlee Monte, MD 50 mL/hr at 08/03/19 1239 4 g at 08/03/19 1239  . polyethylene glycol-electrolytes (NuLYTELY/GoLYTELY) solution 4,000 mL  4,000 mL Oral Once [name], MD        Allergies as of 08/02/2019 - Review Complete 08/02/2019  Allergen Reaction Noted  . Hydrocodone Other (See Comments) 08/02/2019  . Norvasc [amlodipine besylate]  01/31/2018    Family History  Problem Relation Age of Onset  . Heart disease Father        MVP and Pics Valve  . Hypertension Father   . Depression Father        Institutionalized x's 2 years  . Bipolar disorder Father   . Hypertension Sister   . Diabetes Sister   . Hyperlipidemia Sister   . Heart disease Sister 57       MI  . Heart disease Brother   . Hypertension Brother   . Heart disease Paternal Grandmother   . Heart disease Paternal Aunt   . Heart  disease Paternal Uncle   . Schizophrenia Paternal Aunt   . Asthma Son   . Asthma Son     Social History   Socioeconomic History  . Marital status: Divorced    Spouse name: Not on file  . Number of children: Not on file  . Years of education: Not on file  . Highest education level: Not on file  Occupational History  . Occupation: disabledIT sales professional Med  Social Needs  . Financial resource strain: Not on file  . Food insecurity    Worry: Not on file    Inability: Not on file  . Transportation needs    Medical: Not on file    Non-medical: Not on file  Tobacco Use  . Smoking status: Former Smoker    Packs/day: 1.00    Years: 40.00    Pack years: 40.00    Types: Cigarettes    Start date: 07/21/1972    Quit date: 10/16/2011    Years since quitting: 7.8  . Smokeless tobacco: Never Used  Substance and Sexual Activity  . Alcohol use: Not Currently    Comment: Occ-- Wine  . Drug use: No  . Sexual activity: Not on file  Lifestyle  . Physical activity    Days per week: Not on file    Minutes per session: Not on file  . Stress: Not on file  Relationships  . Social Herbalist on phone: Not on file    Gets together: Not on file    Attends religious service: Not on file    Active member of club or organization: Not on file    Attends meetings of clubs or organizations: Not on file    Relationship status: Not on file  . Intimate partner violence    Fear of current or ex partner: Not on file    Emotionally abused: Not on file    Physically abused: Not on file  Forced sexual activity: Not on file  Other Topics Concern  . Not on file  Social History Narrative   [facility] (04/06/17):   Originally from [place]. Has also lived in [place], [place]. She also previously lived in [place] for 20 years. No history of [place] Fever. Moved to [place] in 1989. 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 No pets currently. No bird exposure. Questionable previous mold exposure in her daughter's home. 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     Review of Systems: Positive for: GI: Described in detail in HPI.    Gen: Denies any fever, chills, rigors, night sweats, anorexia, fatigue, weakness, malaise, involuntary weight loss, and sleep disorder CV:  exertional shortness of breath, Denies chest pain, angina, palpitations, syncope, orthopnea, PND, peripheral edema, and claudication. Resp: Denies cough, sputum, wheezing, coughing up blood. GU : Denies urinary burning, blood in urine, urinary frequency, urinary hesitancy, nocturnal urination, and urinary incontinence. MS: Fracture of left tibia.  Denies muscle weakness, cramps, atrophy.  Derm: Denies rash, itching, oral ulcerations, hives, unhealing ulcers.  Psych: Denies depression, anxiety, memory loss, suicidal ideation, hallucinations,  and confusion. Heme: Rectal bleeding, Denies bruising and enlarged lymph nodes. Neuro:  dizziness,Denies any headaches,  paresthesias. Endo:  DM, hypo-thyroid,Denies any problems with  adrenal function.  Physical Exam: Vital signs in last 24 hours: Temp:  [97.9 F (36.6 C)-99.9 F (37.7 C)] 98.5 F (36.9 C) (11/21 1140) Pulse Rate:  [88-95] 88 (11/21 1140) Resp:  [16-32] 20 (11/21 1140) BP: (101-136)/(52-77) 111/57 (11/21 1140) SpO2:  [96 %-100 %] 98 % (11/21 1140) Weight:  [134.7 kg] 134.7 kg (11/20 1656) Last BM Date: 08/02/19  General: Morbidly obese, appears comfortable Head:  Normocephalic and atraumatic. Eyes:  Sclera clear, no icterus.   Prominent pallor Ears:  Normal auditory acuity. Nose:  No deformity, discharge,  or lesions.  On oxygen via nasal cannula Mouth:  No deformity or lesions.  Oropharynx pink & moist. Neck:  Supple; no masses or thyromegaly. Lungs:  Clear  throughout to auscultation.   No wheezes, crackles, or rhonchi. No acute distress. Heart:  Regular rate and rhythm; no murmurs, clicks, rubs,  or gallops. Extremities:  Without clubbing or edema. Neurologic:  Alert and  oriented x4;  grossly normal neurologically. Skin:  Intact without significant lesions or rashes. Psych:  Alert and cooperative. Normal mood and affect. Abdomen:  Soft, nontender and nondistended. No masses, hepatosplenomegaly or hernias noted. Normal bowel sounds, without guarding, and without rebound.         Lab Results: Recent Labs    08/02/19 1837  WBC 13.9*  HGB 4.6*  HCT 15.2*  PLT 225   BMET Recent Labs    08/02/19 1837  NA 140  K 3.4*  CL 101  CO2 30  GLUCOSE 186*  BUN 33*  CREATININE 1.59*  CALCIUM 7.8*   LFT Recent Labs    08/02/19 1837  PROT 7.4  ALBUMIN 3.1*  AST 21  ALT 12  ALKPHOS 127*  BILITOT 0.6   PT/INR Recent Labs    08/02/19 1837  LABPROT 25.7*  INR 2.4*    Studies/Results: Dg Chest 2 View  Result Date: 08/02/2019 CLINICAL DATA:  Shortness of breath. EXAM: CHEST - 2 VIEW COMPARISON:  03/11/2019 FINDINGS: Both  views are degraded by patient body habitus. Lateral view degraded by patient arm position. Numerous leads and wires project over the chest. Midline trachea. Normal heart size. Atherosclerosis in the transverse aorta. No pleural effusion or pneumothorax. No congestive failure. Clear lungs. IMPRESSION: No acute cardiopulmonary disease. Aortic Atherosclerosis (ICD10-I70.0). Electronically Signed   By: Abigail Miyamoto M.D.   On: 08/02/2019 18:26   Nm Gi Blood Loss  Result Date: 08/03/2019 CLINICAL DATA:  GI bleed. EXAM: NUCLEAR MEDICINE GASTROINTESTINAL BLEEDING SCAN TECHNIQUE: Sequential abdominal images were obtained following intravenous administration of Tc-21mlabeled red blood cells. RADIOPHARMACEUTICALS:  22.2 mCi Tc-927mertechnetate in-vitro labeled red cells. COMPARISON:  CT yesterday. FINDINGS: Vague focal  radiotracer accumulation initially in the central lower abdomen courses to the right of the level of the iliac vessels, beginning at minute 22 on first hour imaging. There is to and fro activity which then dissipates. During second hour imaging focal activity again accumulates just to the right of the iliac vessels in the similar location, and courses centrally., activity than again dissipates. IMPRESSION: Vague radiotracer accumulation in the central right lower abdomen with slight to and fro motion that appears intermittently and dissipates over the course of 2 hours. Findings suggest intermittent GI bleed. It is unclear if this is within tortuous sigmoid colon versus small bowel. These results will be called to the ordering clinician or representative by the Radiologist Assistant, and communication documented in the PACS or zVision Dashboard. Electronically Signed   By: MeKeith Rake.D.   On: 08/03/2019 03:28   Ct Abdomen Pelvis W Contrast  Result Date: 08/02/2019 CLINICAL DATA:  Abdominal pain, rectal bleeding EXAM: CT ABDOMEN AND PELVIS WITH CONTRAST TECHNIQUE: Multidetector CT imaging of the abdomen and pelvis was performed using the standard protocol following bolus administration of intravenous contrast. CONTRAST:  100110mMNIPAQUE IOHEXOL 300 MG/ML  SOLN COMPARISON:  03/08/2019 FINDINGS: Lower chest: Incompletely visualized 5 mm nodular density within the superior aspect of the right lower lobe (series 5, image 1). Lung bases otherwise clear. Hepatobiliary: No focal liver abnormality is seen. Multiple calcified stones within the gallbladder lumen. No evidence of gallbladder wall thickening, pericholecystic inflammation, or biliary dilatation. Pancreas: Unremarkable. No pancreatic ductal dilatation or surrounding inflammatory changes. Spleen: Normal in size without focal abnormality. Adrenals/Urinary Tract: Adrenal glands are unremarkable. Kidneys are normal, without renal calculi, focal lesion, or  hydronephrosis. Bladder is unremarkable. Stomach/Bowel: Stomach is within normal limits. Appendix appears normal. There are a few scattered diverticula of the sigmoid colon. No evidence of bowel wall thickening, distention, or inflammatory changes. Vascular/Lymphatic: Aortic atherosclerosis. Incidental note of retroaortic left renal vein. No enlarged abdominal or pelvic lymph nodes. Reproductive: Status post hysterectomy. No adnexal masses. Other: No free fluid within the abdomen or pelvis. Mild rectus diastasis. Musculoskeletal: No acute osseous abnormality. Chronic grade 1 anterolisthesis L4 on L5. IMPRESSION: 1. No acute abdominopelvic findings. 2. Uncomplicated cholelithiasis. 3. Mild sigmoid diverticulosis without acute diverticulitis. 4. Partially visualized 5 mm right lower lobe pulmonary nodule. No follow-up needed if patient is low-risk. Non-contrast chest CT can be considered in 12 months if patient is high-risk. This recommendation follows the consensus statement: Guidelines for Management of Incidental Pulmonary Nodules Detected on CT Images: From the Fleischner Society 2017; Radiology 2017; 284:228-243. Aortic Atherosclerosis (ICD10-I70.0). Electronically Signed   By: NicDavina PokeD.   On: 08/02/2019 21:32    Impression: Painless hematochezia, hemoglobin 4.6 on admission, mild leukocytosis WBC 13.9, normal platelet 225, evaded PT/INR of 25.7/2.4  Renal impairment -elevated  BUN/creatinine of 33/1.59, low GFR of 39 Mild sigmoid diverticulosis without diverticulitis noted on CAT scan Nuclear bleeding scan with intermittent GI bleed, could be from tortuous sigmoid or small bowel  Plan: As recommended patient has received 2 units of PRBC and is receiving third unit of PRBC, has received 1 unit of FFP and vitamin K was given 1 mg IV x1 dose Start clear liquid diet. Will start giving colonic prep, as patient has never had a colonoscopy and she is 53, recommend colonoscopy as an inpatient(due  to multiple comorbidities-outpatient colonoscopy may become challenging). Will need INR to be around 1.5 or less. Lab work pending and to be performed after completion of blood transfusion. She remains hemodynamically stable. If hemoglobin acceptable, remains hemodynamically stable and INR is at an acceptable range, plan colonoscopy on Monday in a.m.   LOS: 1 day   [name], MD  08/03/2019, 1:28 PM

## 2019-08-03 NOTE — Progress Notes (Signed)
MD was notified that the patient should remain in room to receive blood products. There were still 2 more units of blood to be administered and one was infusing. I asked her to speak with Nuclear Imaging regarding the orders/procedures. She refused and MD said "this procedure has to be prioritized". RN left floor to accompany patient.

## 2019-08-03 NOTE — Progress Notes (Signed)
[facility] PROGRESS NOTE  Sue [name] [id number] DOB: 06/22/1951 DOA: 08/02/2019 PCP: Ann Held, DO   Subjective  Denies any bleeding while she is in the hospital. She is receiving her third unit of blood.  Brief history  68 year old female with history of recent DVT, on Coumadin, chronic hypoxic respiratory failure on 3 L of oxygen at home and IDDM, presented with 2-day history of BRBPR, she has been on Coumadin since June for DVT.  Hemoglobin is 4.6 on admission and INR is 2.4.  Assessment and plan   Principal Problem:   GI bleed Active Problems:   DVT (deep venous thrombosis) (HCC)   Diabetes mellitus type II, uncontrolled (HCC)   Symptomatic anemia   QT prolongation   Acute lower GI bleed Possibly diverticular.  CT showing mild sigmoid diverticulosis.   No prior EGD or colonoscopy done.  Does take Coumadin for history of DVT.   GI consulted, currently NPO. Bleeding scan done and showed vague spots on the left abdomen unclear but sigmoid versus small intestine.  Acute blood loss anemia Hemoglobin of 4.6, baseline hemoglobin is 10.4 from September 2020. Transfuse 3 units of packed RBCs and check hemoglobin. Given 1 unit of FFP secondary to elevated INR. -Keep n.p.o.  QT prolongation on EKG -Cardiac monitoring -Keep potassium above 4 magnesium above 2 -Does take psychotropic and antiemetic medications at home.  Hold QT prolonging drugs at this time.  Repeat EKG in a.m.  Mild leukocytosis Likely reactive.  WBC count 13.9.  Patient is afebrile.  X-ray not suggestive of pneumonia.  Not endorsing any UTI symptoms. -Continue to monitor CBC  Mild hypokalemia Potassium 3.4.  Hypomagnesemia, replaced with parenteral supplements  Provoked left lower extremity DVT diagnosed June 2020 Secondary to fracture and poor mobility. -Hold Coumadin at this time given acute GI bleed.  Medications have been given for INR reversal.  Emphysema and  chronic hypoxic respiratory failure Uses 3 L supplemental oxygen at home 24 hours.  Increased to 4 L in the ED.  Has worsening dyspnea due to acute blood loss anemia.  Lungs clear on auscultation.  Chest x-ray negative. -Continuous pulse ox -Continue supplemental oxygen  Insulin-dependent diabetes mellitus -Check A1c.  Sliding scale insulin and CBG checks every 4 hours as patient is n.p.o.  Pulmonary nodule CT showing partially visualized 5 mm right lower lobe pulmonary nodule.  Patient is a former smoker with a 40-pack-year smoking history. -Repeat noncontrast chest CT will be needed in 12 months for follow-up  Code Status: Full Code Family Communication: Plan discussed with the patient. Disposition Plan: Remains inpatient Diet:  Diet Order            Diet NPO time specified  Diet effective now              Consultants   None  Procedures   None  Antibiotics   None (indicate start date, and stop date if known)   Objective   Vitals:   08/03/19 1026 08/03/19 1140  BP: 112/61 (!) 111/57  Pulse: 90 88  Resp: (!) 22 20  Temp: 98 F (36.7 C) 98.5 F (36.9 C)  SpO2: 98% 98%    Intake/Output Summary (Last 24 hours) at 08/03/2019 1227 Last data filed at 08/03/2019 0831 Gross per 24 hour  Intake 1083.02 ml  Output --  Net 1083.02 ml   Filed Weights   08/02/19 1656  Weight: 134.7 kg    Physical examination  General: Alert and awake, oriented x3,  not in any acute distress. HEENT: anicteric sclera, pupils reactive to light and accommodation, EOMI CVS: S1-S2 clear, no murmur rubs or gallops Chest: clear to auscultation bilaterally, no wheezing, rales or rhonchi Abdomen: soft nontender, nondistended, normal bowel sounds, no organomegaly Extremities: no cyanosis, clubbing or edema noted bilaterally Neuro: Cranial nerves II-XII intact, no focal neurological deficits  Reviewed data  Basic Metabolic Panel: Recent Labs  Lab 08/02/19 1837  NA 140  K 3.4*   CL 101  CO2 30  GLUCOSE 186*  BUN 33*  CREATININE 1.59*  CALCIUM 7.8*  MG 1.2*   Liver Function Tests: Recent Labs  Lab 08/02/19 1837  AST 21  ALT 12  ALKPHOS 127*  BILITOT 0.6  PROT 7.4  ALBUMIN 3.1*   No results for input(s): LIPASE, AMYLASE in the last 168 hours. No results for input(s): AMMONIA in the last 168 hours. CBC: Recent Labs  Lab 08/02/19 1837  WBC 13.9*  HGB 4.6*  HCT 15.2*  MCV 92.7  PLT 225   Cardiac Enzymes: No results for input(s): CKTOTAL, CKMB, CKMBINDEX, TROPONINI in the last 168 hours. BNP (last 3 results) No results for input(s): BNP in the last 8760 hours.  ProBNP (last 3 results) No results for input(s): PROBNP in the last 8760 hours.  CBG: Recent Labs  Lab 08/03/19 0336 08/03/19 0812  GLUCAP 110* 130*    Micro Recent Results (from the past 240 hour(s))  SARS CORONAVIRUS 2 (TAT 6-24 HRS) Nasopharyngeal Nasopharyngeal Swab     Status: None   Collection Time: 08/02/19  6:41 PM   Specimen: Nasopharyngeal Swab  Result Value Ref Range Status   SARS Coronavirus 2 NEGATIVE NEGATIVE Final    Comment: (NOTE) SARS-CoV-2 target nucleic acids are NOT DETECTED. The SARS-CoV-2 RNA is generally detectable in upper and lower respiratory specimens during the acute phase of infection. Negative results do not preclude SARS-CoV-2 infection, do not rule out co-infections with other pathogens, and should not be used as the sole basis for treatment or other patient management decisions. Negative results must be combined with clinical observations, patient history, and epidemiological information. The expected result is Negative. Fact Sheet for Patients: SugarRoll.be Fact Sheet for Healthcare Providers: https://www.woods-mathews.com/ This test is not yet approved or cleared by the [place] FDA and  has been authorized for detection and/or diagnosis of SARS-CoV-2 by FDA under an Emergency Use  Authorization (EUA). This EUA will remain  in effect (meaning this test can be used) for the duration of the COVID-19 declaration under Section 56 4(b)(1) of the Act, 21 U.S.C. section 360bbb-3(b)(1), unless the authorization is terminated or revoked sooner. Performed at [facility], [place] [address] [place], [place] [address]      Radiological studies, reviewed  Dg Chest 2 View  Result Date: 08/02/2019 CLINICAL DATA:  Shortness of breath. EXAM: CHEST - 2 VIEW COMPARISON:  03/11/2019 FINDINGS: Both views are degraded by patient body habitus. Lateral view degraded by patient arm position. Numerous leads and wires project over the chest. Midline trachea. Normal heart size. Atherosclerosis in the transverse aorta. No pleural effusion or pneumothorax. No congestive failure. Clear lungs. IMPRESSION: No acute cardiopulmonary disease. Aortic Atherosclerosis (ICD10-I70.0). Electronically Signed   By: Abigail Miyamoto M.D.   On: 08/02/2019 18:26   Nm Gi Blood Loss  Result Date: 08/03/2019 CLINICAL DATA:  GI bleed. EXAM: NUCLEAR MEDICINE GASTROINTESTINAL BLEEDING SCAN TECHNIQUE: Sequential abdominal images were obtained following intravenous administration of Tc-22m labeled red blood cells. RADIOPHARMACEUTICALS:  22.2 mCi  Tc-76m pertechnetate in-vitro labeled red cells. COMPARISON:  CT yesterday. FINDINGS: Vague focal radiotracer accumulation initially in the central lower abdomen courses to the right of the level of the iliac vessels, beginning at minute 22 on first hour imaging. There is to and fro activity which then dissipates. During second hour imaging focal activity again accumulates just to the right of the iliac vessels in the similar location, and courses centrally., activity than again dissipates. IMPRESSION: Vague radiotracer accumulation in the central right lower abdomen with slight to and fro motion that appears intermittently and dissipates over the course of 2 hours. Findings suggest  intermittent GI bleed. It is unclear if this is within tortuous sigmoid colon versus small bowel. These results will be called to the ordering clinician or representative by the Radiologist Assistant, and communication documented in the PACS or zVision Dashboard. Electronically Signed   By: Keith Rake M.D.   On: 08/03/2019 03:28   Ct Abdomen Pelvis W Contrast  Result Date: 08/02/2019 CLINICAL DATA:  Abdominal pain, rectal bleeding EXAM: CT ABDOMEN AND PELVIS WITH CONTRAST TECHNIQUE: Multidetector CT imaging of the abdomen and pelvis was performed using the standard protocol following bolus administration of intravenous contrast. CONTRAST:  157mL OMNIPAQUE IOHEXOL 300 MG/ML  SOLN COMPARISON:  03/08/2019 FINDINGS: Lower chest: Incompletely visualized 5 mm nodular density within the superior aspect of the right lower lobe (series 5, image 1). Lung bases otherwise clear. Hepatobiliary: No focal liver abnormality is seen. Multiple calcified stones within the gallbladder lumen. No evidence of gallbladder wall thickening, pericholecystic inflammation, or biliary dilatation. Pancreas: Unremarkable. No pancreatic ductal dilatation or surrounding inflammatory changes. Spleen: Normal in size without focal abnormality. Adrenals/Urinary Tract: Adrenal glands are unremarkable. Kidneys are normal, without renal calculi, focal lesion, or hydronephrosis. Bladder is unremarkable. Stomach/Bowel: Stomach is within normal limits. Appendix appears normal. There are a few scattered diverticula of the sigmoid colon. No evidence of bowel wall thickening, distention, or inflammatory changes. Vascular/Lymphatic: Aortic atherosclerosis. Incidental note of retroaortic left renal vein. No enlarged abdominal or pelvic lymph nodes. Reproductive: Status post hysterectomy. No adnexal masses. Other: No free fluid within the abdomen or pelvis. Mild rectus diastasis. Musculoskeletal: No acute osseous abnormality. Chronic grade 1  anterolisthesis L4 on L5. IMPRESSION: 1. No acute abdominopelvic findings. 2. Uncomplicated cholelithiasis. 3. Mild sigmoid diverticulosis without acute diverticulitis. 4. Partially visualized 5 mm right lower lobe pulmonary nodule. No follow-up needed if patient is low-risk. Non-contrast chest CT can be considered in 12 months if patient is high-risk. This recommendation follows the consensus statement: Guidelines for Management of Incidental Pulmonary Nodules Detected on CT Images: From the Fleischner Society 2017; Radiology 2017; 284:228-243. Aortic Atherosclerosis (ICD10-I70.0). Electronically Signed   By: Davina Poke M.D.   On: 08/02/2019 21:32    Scheduled Meds:  insulin aspart  0-9 Units Subcutaneous Q4H   Continuous Infusions:  sodium chloride     magnesium sulfate bolus IVPB       Time spent: 35 minutes  [facility] Pager [phone] If 7PM-7AM, please contact night-coverage at [email or web address], password [facility] 08/03/2019, 12:27 PM  LOS: 1 day

## 2019-08-03 NOTE — Progress Notes (Addendum)
Pt is undergoing a nuclear imaging to locate bleed. Receiving one unit of blood. Two remaining.

## 2019-08-03 NOTE — Progress Notes (Signed)
Pt infusing second unit of blood. Stable vital signs.

## 2019-08-04 ENCOUNTER — Other Ambulatory Visit: Payer: Self-pay

## 2019-08-04 LAB — CBC
HCT: 26.9 % — ABNORMAL LOW (ref 36.0–46.0)
HCT: 25.5 % — ABNORMAL LOW (ref 36.0–46.0)
Hemoglobin: 8.3 g/dL — ABNORMAL LOW (ref 12.0–15.0)
Hemoglobin: 7.9 g/dL — ABNORMAL LOW (ref 12.0–15.0)
MCH: 28.5 pg (ref 26.0–34.0)
MCH: 28.5 pg (ref 26.0–34.0)
MCHC: 30.9 g/dL (ref 30.0–36.0)
MCHC: 31 g/dL (ref 30.0–36.0)
MCV: 92.4 fL (ref 80.0–100.0)
MCV: 92.1 fL (ref 80.0–100.0)
Platelets: 182 10*3/uL (ref 150–400)
Platelets: 185 10*3/uL (ref 150–400)
RBC: 2.91 MIL/uL — ABNORMAL LOW (ref 3.87–5.11)
RBC: 2.77 MIL/uL — ABNORMAL LOW (ref 3.87–5.11)
RDW: 15.1 % (ref 11.5–15.5)
RDW: 15 % (ref 11.5–15.5)
WBC: 8.7 10*3/uL (ref 4.0–10.5)
WBC: 8.8 10*3/uL (ref 4.0–10.5)
nRBC: 0.6 % — ABNORMAL HIGH (ref 0.0–0.2)
nRBC: 0.5 % — ABNORMAL HIGH (ref 0.0–0.2)

## 2019-08-04 LAB — HEMOGLOBIN A1C
Hgb A1c MFr Bld: 6.5 % — ABNORMAL HIGH (ref 4.8–5.6)
Mean Plasma Glucose: 139.85 mg/dL

## 2019-08-04 LAB — PREPARE FRESH FROZEN PLASMA: Unit division: 0

## 2019-08-04 LAB — BASIC METABOLIC PANEL
Anion gap: 9 (ref 5–15)
Anion gap: 7 (ref 5–15)
BUN: 21 mg/dL (ref 8–23)
BUN: 20 mg/dL (ref 8–23)
CO2: 29 mmol/L (ref 22–32)
CO2: 29 mmol/L (ref 22–32)
Calcium: 7.5 mg/dL — ABNORMAL LOW (ref 8.9–10.3)
Calcium: 7.4 mg/dL — ABNORMAL LOW (ref 8.9–10.3)
Chloride: 102 mmol/L (ref 98–111)
Chloride: 102 mmol/L (ref 98–111)
Creatinine, Ser: 1.39 mg/dL — ABNORMAL HIGH (ref 0.44–1.00)
Creatinine, Ser: 1.29 mg/dL — ABNORMAL HIGH (ref 0.44–1.00)
GFR calc Af Amer: 45 mL/min — ABNORMAL LOW (ref >60)
GFR calc Af Amer: 50 mL/min — ABNORMAL LOW (ref >60)
GFR calc non Af Amer: 39 mL/min — ABNORMAL LOW (ref >60)
GFR calc non Af Amer: 43 mL/min — ABNORMAL LOW (ref >60)
Glucose, Bld: 183 mg/dL — ABNORMAL HIGH (ref 70–99)
Glucose, Bld: 159 mg/dL — ABNORMAL HIGH (ref 70–99)
Potassium: 3 mmol/L — ABNORMAL LOW (ref 3.5–5.1)
Potassium: 3.1 mmol/L — ABNORMAL LOW (ref 3.5–5.1)
Sodium: 140 mmol/L (ref 135–145)
Sodium: 138 mmol/L (ref 135–145)

## 2019-08-04 LAB — HEMOGLOBIN AND HEMATOCRIT, BLOOD
HCT: 24.8 % — ABNORMAL LOW (ref 36.0–46.0)
Hemoglobin: 7.6 g/dL — ABNORMAL LOW (ref 12.0–15.0)

## 2019-08-04 LAB — GLUCOSE, CAPILLARY
Glucose-Capillary: 148 mg/dL — ABNORMAL HIGH (ref 70–99)
Glucose-Capillary: 155 mg/dL — ABNORMAL HIGH (ref 70–99)
Glucose-Capillary: 99 mg/dL (ref 70–99)

## 2019-08-04 LAB — MAGNESIUM
Magnesium: 2.4 mg/dL (ref 1.7–2.4)
Magnesium: 3 mg/dL — ABNORMAL HIGH (ref 1.7–2.4)

## 2019-08-04 LAB — BPAM FFP
Blood Product Expiration Date: 202011262359
ISSUE DATE / TIME: 202011211539
Unit Type and Rh: 5100

## 2019-08-04 MED ORDER — FUROSEMIDE 10 MG/ML IJ SOLN
40.0000 mg | Freq: Two times a day (BID) | INTRAMUSCULAR | Status: AC
  Administered 2019-08-04 – 2019-08-05 (×3): 40 mg via INTRAVENOUS
  Filled 2019-08-04 (×3): qty 4

## 2019-08-04 MED ORDER — PEG 3350-KCL-NA BICARB-NACL 420 G PO SOLR
4000.0000 mL | Freq: Once | ORAL | Status: AC
  Administered 2019-08-04: 17:00:00 4000 mL via ORAL

## 2019-08-04 MED ORDER — POTASSIUM CHLORIDE 10 MEQ/100ML IV SOLN
10.0000 meq | INTRAVENOUS | Status: AC
  Administered 2019-08-04 (×4): 10 meq via INTRAVENOUS
  Filled 2019-08-04 (×4): qty 100

## 2019-08-04 MED ORDER — SODIUM CHLORIDE 0.9 % IV SOLN
INTRAVENOUS | Status: DC
  Administered 2019-08-04: 02:00:00 via INTRAVENOUS

## 2019-08-04 MED ORDER — MAGNESIUM SULFATE 2 GM/50ML IV SOLN
2.0000 g | Freq: Once | INTRAVENOUS | Status: AC
  Administered 2019-08-04: 03:00:00 2 g via INTRAVENOUS
  Filled 2019-08-04: qty 50

## 2019-08-04 MED ORDER — POTASSIUM CHLORIDE CRYS ER 20 MEQ PO TBCR
40.0000 meq | EXTENDED_RELEASE_TABLET | Freq: Four times a day (QID) | ORAL | Status: AC
  Administered 2019-08-04 (×2): 40 meq via ORAL
  Filled 2019-08-04 (×2): qty 2

## 2019-08-04 NOTE — Progress Notes (Addendum)
Pt has asked to have right IV and Left anticubital IV removed due to pressure. Pt. states she felt a stretching feeling. IV sites removed. IV team called.  After two blood filled stools, doctor was contacted, EKG performed and labs drawn. Encouraged to keep Mg levels above 2.0. Mg and K ordered. [place] was held.

## 2019-08-04 NOTE — H&P (View-Only) (Signed)
Subjective: Patient complains of fatigue, states she has not slept for 3 days, finished a gallon of breath, continue to have multiple maroon bowel movements.  Has not eaten in a few days except clear liquid.  Objective: Vital signs in last 24 hours: Temp:  [97.7 F (36.5 C)-98.5 F (36.9 C)] 97.9 F (36.6 C) (11/22 0444) Pulse Rate:  [86-92] 92 (11/22 0444) Resp:  [18-22] 18 (11/22 0444) BP: (111-151)/(57-97) 136/74 (11/22 0444) SpO2:  [94 %-100 %] 99 % (11/22 0444) Weight change:  Last BM Date: 08/02/19  PE: Morbidly obese, on oxygen 4 L via nasal cannula, mild shortness of breath, able to speak in few words not full sentences GENERAL: Lying on bed ABDOMEN: Nondistended EXTREMITIES: No deformity,obese extremities  Lab Results: Results for orders placed or performed during the hospital encounter of 08/02/19 (from the past 48 hour(s))  ABO/Rh     Status: None   Collection Time: 08/02/19  4:39 PM  Result Value Ref Range   ABO/RH(D)      O POS Performed at [facility] [address] [place], [place] [address]   POC occult blood, ED     Status: Abnormal   Collection Time: 08/02/19  5:45 PM  Result Value Ref Range   Fecal Occult Bld POSITIVE (A) NEGATIVE  Prepare RBC     Status: None   Collection Time: 08/02/19  6:36 PM  Result Value Ref Range   Order Confirmation      ORDER PROCESSED BY BLOOD BANK Performed at [facility] [address] [place], [place] [address]   Comprehensive metabolic panel     Status: Abnormal   Collection Time: 08/02/19  6:37 PM  Result Value Ref Range   Sodium 140 135 - 145 mmol/L   Potassium 3.4 (L) 3.5 - 5.1 mmol/L   Chloride 101 98 - 111 mmol/L   CO2 30 22 - 32 mmol/L   Glucose, Bld 186 (H) 70 - 99 mg/dL   BUN 33 (H) 8 - 23 mg/dL   Creatinine, Ser 1.59 (H) 0.44 - 1.00 mg/dL   Calcium 7.8 (L) 8.9 - 10.3 mg/dL   Total Protein 7.4 6.5 - 8.1 g/dL   Albumin 3.1 (L) 3.5 - 5.0 g/dL   AST 21 15 - 41 U/L    ALT 12 0 - 44 U/L   Alkaline Phosphatase 127 (H) 38 - 126 U/L   Total Bilirubin 0.6 0.3 - 1.2 mg/dL   GFR calc non Af Amer 33 (L) >60 mL/min   GFR calc Af Amer 39 (L) >60 mL/min   Anion gap 9 5 - 15    Comment: Performed at [facility], [place] [address] [place], [place] [address]  CBC     Status: Abnormal   Collection Time: 08/02/19  6:37 PM  Result Value Ref Range   WBC 13.9 (H) 4.0 - 10.5 K/uL   RBC 1.64 (L) 3.87 - 5.11 MIL/uL   Hemoglobin 4.6 (LL) 12.0 - 15.0 g/dL    Comment: REPEATED TO VERIFY THIS CRITICAL RESULT HAS VERIFIED AND BEEN CALLED TO J.T[place]INGTON BY NATHAN THOMPSON ON 11 20 2020 AT 1911, AND HAS BEEN READ BACK. CRITICAL RESULT VERIFIED    HCT 15.2 (L) 36.0 - 46.0 %   MCV 92.7 80.0 - 100.0 fL   MCH 28.0 26.0 - 34.0 pg   MCHC 30.3 30.0 - 36.0 g/dL   RDW 15.1 11.5 - 15.5 %   Platelets 225 150 - 400 K/uL  nRBC 0.4 (H) 0.0 - 0.2 %    Comment: Performed at [facility], [place] [address], [place], [place] [address]  Type and screen [place]     Status: None   Collection Time: 08/02/19  6:37 PM  Result Value Ref Range   ABO/RH(D) O POS    Antibody Screen NEG    Sample Expiration 08/05/2019,2359    Unit Number [id number]    Blood Component Type RED CELLS,LR    Unit division 00    Status of Unit ISSUED,FINAL    Transfusion Status OK TO TRANSFUSE    Crossmatch Result Compatible    Unit Number [id number]    Blood Component Type RED CELLS,LR    Unit division 00    Status of Unit ISSUED,FINAL    Transfusion Status OK TO TRANSFUSE    Crossmatch Result      Compatible Performed at [facility], [place] [address] [place], [place] [address]    Unit Number [id number]    Blood Component Type RED CELLS,LR    Unit division 00    Status of Unit ISSUED,FINAL    Transfusion Status OK TO TRANSFUSE    Crossmatch Result Compatible   Protime-INR     Status: Abnormal   Collection Time:  08/02/19  6:37 PM  Result Value Ref Range   Prothrombin Time 25.7 (H) 11.4 - 15.2 seconds   INR 2.4 (H) 0.8 - 1.2    Comment: (NOTE) INR goal varies based on device and disease states. Performed at [facility], [place] [address] [place], [place] [address]   Magnesium     Status: Abnormal   Collection Time: 08/02/19  6:37 PM  Result Value Ref Range   Magnesium 1.2 (L) 1.7 - 2.4 mg/dL    Comment: Performed at [facility], [place] [address] [place], [place] [address]  SARS CORONAVIRUS 2 (TAT 6-24 HRS) Nasopharyngeal Nasopharyngeal Swab     Status: None   Collection Time: 08/02/19  6:41 PM   Specimen: Nasopharyngeal Swab  Result Value Ref Range   SARS Coronavirus 2 NEGATIVE NEGATIVE    Comment: (NOTE) SARS-CoV-2 target nucleic acids are NOT DETECTED. The SARS-CoV-2 RNA is generally detectable in upper and lower respiratory specimens during the acute phase of infection. Negative results do not preclude SARS-CoV-2 infection, do not rule out co-infections with other pathogens, and should not be used as the sole basis for treatment or other patient management decisions. Negative results must be combined with clinical observations, patient history, and epidemiological information. The expected result is Negative. Fact Sheet for Patients: SugarRoll.be Fact Sheet for Healthcare Providers: https://www.woods-mathews.com/ This test is not yet approved or cleared by the [place] FDA and  has been authorized for detection and/or diagnosis of SARS-CoV-2 by FDA under an Emergency Use Authorization (EUA). This EUA will remain  in effect (meaning this test can be used) for the duration of the COVID-19 declaration under Section 56 4(b)(1) of the Act, 21 U.S.C. section 360bbb-3(b)(1), unless the authorization is terminated or revoked sooner. Performed at [facility], [place] [address], [place],  [place] [address]   Prepare fresh frozen plasma     Status: None   Collection Time: 08/02/19 10:08 PM  Result Value Ref Range   Unit Number [id number]    Blood Component Type THAWED PLASMA    Unit division 00    Status of Unit ISSUED,FINAL    Transfusion Status  OK TO TRANSFUSE Performed at [facility] [address], [place], [place] [address]   Glucose, capillary     Status: Abnormal   Collection Time: 08/03/19  3:36 AM  Result Value Ref Range   Glucose-Capillary 110 (H) 70 - 99 mg/dL   Comment 1 Notify RN   Glucose, capillary     Status: Abnormal   Collection Time: 08/03/19  8:12 AM  Result Value Ref Range   Glucose-Capillary 130 (H) 70 - 99 mg/dL  Glucose, capillary     Status: Abnormal   Collection Time: 08/03/19  4:16 PM  Result Value Ref Range   Glucose-Capillary 227 (H) 70 - 99 mg/dL  CBC     Status: Abnormal   Collection Time: 08/03/19  4:47 PM  Result Value Ref Range   WBC 8.6 4.0 - 10.5 K/uL   RBC 2.98 (L) 3.87 - 5.11 MIL/uL   Hemoglobin 8.5 (L) 12.0 - 15.0 g/dL    Comment: POST TRANSFUSION SPECIMEN   HCT 27.3 (L) 36.0 - 46.0 %   MCV 91.6 80.0 - 100.0 fL   MCH 28.5 26.0 - 34.0 pg   MCHC 31.1 30.0 - 36.0 g/dL   RDW 14.9 11.5 - 15.5 %   Platelets 192 150 - 400 K/uL   nRBC 0.6 (H) 0.0 - 0.2 %    Comment: Performed at [facility], [place] [address], [place], [place] [id number]  Basic metabolic panel     Status: Abnormal   Collection Time: 08/03/19  4:47 PM  Result Value Ref Range   Sodium 139 135 - 145 mmol/L   Potassium 3.4 (L) 3.5 - 5.1 mmol/L   Chloride 100 98 - 111 mmol/L   CO2 29 22 - 32 mmol/L   Glucose, Bld 239 (H) 70 - 99 mg/dL   BUN 24 (H) 8 - 23 mg/dL   Creatinine, Ser 1.45 (H) 0.44 - 1.00 mg/dL   Calcium 7.5 (L) 8.9 - 10.3 mg/dL   GFR calc non Af Amer 37 (L) >60 mL/min   GFR calc Af Amer 43 (L) >60 mL/min   Anion gap 10 5 - 15    Comment: Performed at [facility], [place] [address],  [place], [place] [address]  Protime-INR     Status: Abnormal   Collection Time: 08/03/19  4:47 PM  Result Value Ref Range   Prothrombin Time 20.6 (H) 11.4 - 15.2 seconds   INR 1.8 (H) 0.8 - 1.2    Comment: (NOTE) INR goal varies based on device and disease states. Performed at [facility], [place] [address], [place], [place] [address]   Glucose, capillary     Status: Abnormal   Collection Time: 08/03/19  9:20 PM  Result Value Ref Range   Glucose-Capillary 172 (H) 70 - 99 mg/dL  Glucose, capillary     Status: Abnormal   Collection Time: 08/03/19 11:55 PM  Result Value Ref Range   Glucose-Capillary 157 (H) 70 - 99 mg/dL  CBC     Status: Abnormal   Collection Time: 08/04/19  1:19 AM  Result Value Ref Range   WBC 8.7 4.0 - 10.5 K/uL   RBC 2.91 (L) 3.87 - 5.11 MIL/uL   Hemoglobin 8.3 (L) 12.0 - 15.0 g/dL   HCT 26.9 (L) 36.0 - 46.0 %   MCV 92.4 80.0 - 100.0 fL   MCH 28.5 26.0 - 34.0 pg   MCHC 30.9 30.0 - 36.0 g/dL   RDW 15.1 11.5 -  15.5 %   Platelets 182 150 - 400 K/uL   nRBC 0.6 (H) 0.0 - 0.2 %    Comment: Performed at [facility], [place] [address] [place], [place] [id number]  Basic metabolic panel     Status: Abnormal   Collection Time: 08/04/19  1:19 AM  Result Value Ref Range   Sodium 140 135 - 145 mmol/L   Potassium 3.0 (L) 3.5 - 5.1 mmol/L   Chloride 102 98 - 111 mmol/L   CO2 29 22 - 32 mmol/L   Glucose, Bld 183 (H) 70 - 99 mg/dL   BUN 21 8 - 23 mg/dL   Creatinine, Ser 1.39 (H) 0.44 - 1.00 mg/dL   Calcium 7.5 (L) 8.9 - 10.3 mg/dL   GFR calc non Af Amer 39 (L) >60 mL/min   GFR calc Af Amer 45 (L) >60 mL/min   Anion gap 9 5 - 15    Comment: Performed at [facility] [address], [place], [place] [address]  Magnesium     Status: None   Collection Time: 08/04/19  1:19 AM  Result Value Ref Range   Magnesium 2.4 1.7 - 2.4 mg/dL    Comment: Performed at [facility], [place] [address], [place],  [place] [address]  Hemoglobin A1c     Status: Abnormal   Collection Time: 08/04/19  5:07 AM  Result Value Ref Range   Hgb A1c MFr Bld 6.5 (H) 4.8 - 5.6 %    Comment: (NOTE) Pre diabetes:          5.7%-6.4% Diabetes:              >6.4% Glycemic control for   <7.0% adults with diabetes    Mean Plasma Glucose 139.85 mg/dL    Comment: Performed at [facility] [address], [place] [address]  BMP daily     Status: Abnormal   Collection Time: 08/04/19  5:07 AM  Result Value Ref Range   Sodium 138 135 - 145 mmol/L   Potassium 3.1 (L) 3.5 - 5.1 mmol/L   Chloride 102 98 - 111 mmol/L   CO2 29 22 - 32 mmol/L   Glucose, Bld 159 (H) 70 - 99 mg/dL   BUN 20 8 - 23 mg/dL   Creatinine, Ser 1.29 (H) 0.44 - 1.00 mg/dL   Calcium 7.4 (L) 8.9 - 10.3 mg/dL   GFR calc non Af Amer 43 (L) >60 mL/min   GFR calc Af Amer 50 (L) >60 mL/min   Anion gap 7 5 - 15    Comment: Performed at [facility] [address], [place], [place] [address]  CBC in AM     Status: Abnormal   Collection Time: 08/04/19  5:07 AM  Result Value Ref Range   WBC 8.8 4.0 - 10.5 K/uL   RBC 2.77 (L) 3.87 - 5.11 MIL/uL   Hemoglobin 7.9 (L) 12.0 - 15.0 g/dL   HCT 25.5 (L) 36.0 - 46.0 %   MCV 92.1 80.0 - 100.0 fL   MCH 28.5 26.0 - 34.0 pg   MCHC 31.0 30.0 - 36.0 g/dL   RDW 15.0 11.5 - 15.5 %   Platelets 185 150 - 400 K/uL   nRBC 0.5 (H) 0.0 - 0.2 %    Comment: Performed at [facility], [place] [address] [place], [place] [address]  Magnesium in AM     Status: Abnormal   Collection Time: 08/04/19  5:07 AM  Result Value Ref Range   Magnesium 3.0 (H) 1.7 - 2.4 mg/dL    Comment: Performed at [facility], [place] [address], [place], [place] [address]  Glucose, capillary     Status: Abnormal   Collection Time: 08/04/19  6:43 AM  Result Value Ref Range   Glucose-Capillary 148 (H) 70 - 99 mg/dL  Glucose, capillary     Status: Abnormal   Collection Time: 08/04/19  7:50 AM   Result Value Ref Range   Glucose-Capillary 155 (H) 70 - 99 mg/dL    Studies/Results: Dg Chest 2 View  Result Date: 08/02/2019 CLINICAL DATA:  Shortness of breath. EXAM: CHEST - 2 VIEW COMPARISON:  03/11/2019 FINDINGS: Both views are degraded by patient body habitus. Lateral view degraded by patient arm position. Numerous leads and wires project over the chest. Midline trachea. Normal heart size. Atherosclerosis in the transverse aorta. No pleural effusion or pneumothorax. No congestive failure. Clear lungs. IMPRESSION: No acute cardiopulmonary disease. Aortic Atherosclerosis (ICD10-I70.0). Electronically Signed   By: Abigail Miyamoto M.D.   On: 08/02/2019 18:26   Nm Gi Blood Loss  Result Date: 08/03/2019 CLINICAL DATA:  GI bleed. EXAM: NUCLEAR MEDICINE GASTROINTESTINAL BLEEDING SCAN TECHNIQUE: Sequential abdominal images were obtained following intravenous administration of Tc-45m labeled red blood cells. RADIOPHARMACEUTICALS:  22.2 mCi Tc-40m pertechnetate in-vitro labeled red cells. COMPARISON:  CT yesterday. FINDINGS: Vague focal radiotracer accumulation initially in the central lower abdomen courses to the right of the level of the iliac vessels, beginning at minute 22 on first hour imaging. There is to and fro activity which then dissipates. During second hour imaging focal activity again accumulates just to the right of the iliac vessels in the similar location, and courses centrally., activity than again dissipates. IMPRESSION: Vague radiotracer accumulation in the central right lower abdomen with slight to and fro motion that appears intermittently and dissipates over the course of 2 hours. Findings suggest intermittent GI bleed. It is unclear if this is within tortuous sigmoid colon versus small bowel. These results will be called to the ordering clinician or representative by the Radiologist Assistant, and communication documented in the PACS or zVision Dashboard. Electronically Signed   By:  Keith Rake M.D.   On: 08/03/2019 03:28   Ct Abdomen Pelvis W Contrast  Result Date: 08/02/2019 CLINICAL DATA:  Abdominal pain, rectal bleeding EXAM: CT ABDOMEN AND PELVIS WITH CONTRAST TECHNIQUE: Multidetector CT imaging of the abdomen and pelvis was performed using the standard protocol following bolus administration of intravenous contrast. CONTRAST:  153mL OMNIPAQUE IOHEXOL 300 MG/ML  SOLN COMPARISON:  03/08/2019 FINDINGS: Lower chest: Incompletely visualized 5 mm nodular density within the superior aspect of the right lower lobe (series 5, image 1). Lung bases otherwise clear. Hepatobiliary: No focal liver abnormality is seen. Multiple calcified stones within the gallbladder lumen. No evidence of gallbladder wall thickening, pericholecystic inflammation, or biliary dilatation. Pancreas: Unremarkable. No pancreatic ductal dilatation or surrounding inflammatory changes. Spleen: Normal in size without focal abnormality. Adrenals/Urinary Tract: Adrenal glands are unremarkable. Kidneys are normal, without renal calculi, focal lesion, or hydronephrosis. Bladder is unremarkable. Stomach/Bowel: Stomach is within normal limits. Appendix appears normal. There are a few scattered diverticula of the sigmoid colon. No evidence of bowel wall thickening, distention, or inflammatory changes. Vascular/Lymphatic: Aortic atherosclerosis. Incidental note of retroaortic left renal vein. No enlarged abdominal or pelvic lymph nodes. Reproductive: Status post hysterectomy. No adnexal masses. Other: No free fluid within the abdomen or pelvis. Mild rectus diastasis. Musculoskeletal: No  acute osseous abnormality. Chronic grade 1 anterolisthesis L4 on L5. IMPRESSION: 1. No acute abdominopelvic findings. 2. Uncomplicated cholelithiasis. 3. Mild sigmoid diverticulosis without acute diverticulitis. 4. Partially visualized 5 mm right lower lobe pulmonary nodule. No follow-up needed if patient is low-risk. Non-contrast chest CT can  be considered in 12 months if patient is high-risk. This recommendation follows the consensus statement: Guidelines for Management of Incidental Pulmonary Nodules Detected on CT Images: From the Fleischner Society 2017; Radiology 2017; 284:228-243. Aortic Atherosclerosis (ICD10-I70.0). Electronically Signed   By: Davina Poke M.D.   On: 08/02/2019 21:32    Medications: I have reviewed the patient's current medications.  Assessment: Painless hematochezia in the setting of Coumadin use, with sigmoid diverticulosis noted on CAT scan and no prior colonoscopy. Patient has multiple comorbidities-is morbidly obese, BMI of 52.62, 135 kg, has COPD oxygen dependent, will likely not be able to get an outpatient colonoscopy So far has received 3 units PRBC, 1 unit FFP and vitamin K 1 mg IV Hb has improved from 4.6 on admission to 7.9 but has ongoing hematochezia with colonic prep, however remains hemodynamically stable. INR has improved to 1.8.  Plan: Will give another gallon of GoLYTELY, with plans for diagnostic colonoscopy if deemed stable enough to receive anesthesia. Bleeding scan showed intermittent bleeding in the area of sigmoid colon/small bowel. If patient not deemed safe for anesthesia due to multiple underlying comorbidities, may need IR guided embolization(to be noted -patient has a GFR of 50, creatinine of 1.29). H&H every 12 hours with plans to transfuse PRBC if hemoglobin is less than 7.  [name], MD 08/04/2019, 11:00 AM

## 2019-08-04 NOTE — Progress Notes (Signed)
[facility] PROGRESS NOTE  Sue [name] [id number] DOB: [birth year] DOA: 08/02/2019 PCP: Ann Held, DO   Subjective  Continues to have bloody bowel movements. Seen by GI, started prep for possible colonoscopy tomorrow. Status post transfusion of 3 units of packed RBCs, hemoglobin is 7.9. INR is 1.8, proceed FFP and vitamin K, check in a.m. For some reason started on IV fluids back again, discontinue IV fluids give Lasix to keep output greater than intake.  Follow BMP in a.m.  Brief history  68 year old female with history of recent DVT, on Coumadin, chronic hypoxic respiratory failure on 3 L of oxygen at home and IDDM, presented with 2-day history of BRBPR, she has been on Coumadin since June for DVT.  Hemoglobin is 4.6 on admission and INR is 2.4.  Assessment and plan   Principal Problem:   GI bleed Active Problems:   DVT (deep venous thrombosis) (HCC)   Diabetes mellitus type II, uncontrolled (HCC)   Symptomatic anemia   QT prolongation   Acute lower GI bleed Possibly diverticular.  CT showing mild sigmoid diverticulosis.   No prior EGD or colonoscopy done.  Does take Coumadin for history of DVT.   GI consulted, currently NPO. Bleeding scan done and showed vague spots on the left abdomen unclear but sigmoid versus small intestine. Seen by GI, start preparation for possible colonoscopy in a.m.  Acute blood loss anemia Hemoglobin of 4.6, baseline hemoglobin is 10.4 from September 2020. Transfuse 3 units of packed RBCs and check hemoglobin. Given 1 unit of FFP secondary to elevated INR. Hemoglobin is 7.9 this morning, will hold on transfusion, transfuse if hemoglobin <7.5.  QT prolongation on EKG -Cardiac monitoring -Keep potassium above 4 magnesium above 2 -QTc was 511 ms, obtain EKG.  Mild leukocytosis Likely reactive.  WBC count 13.9.  Patient is afebrile.  X-ray not suggestive of pneumonia.  Not endorsing any UTI symptoms. -Continue to  monitor CBC  Mild hypokalemia Potassium 3.4.  Hypomagnesemia This is aggressively treated, magnesium overcorrected H3 today. Monitor, check magnesium in a.m.  Provoked left lower extremity DVT diagnosed June 2020 Secondary to fracture and poor mobility. -Hold Coumadin at this time given acute GI bleed.  Medications have been given for INR reversal.  Emphysema and chronic hypoxic respiratory failure Uses 3 L supplemental oxygen at home 24 hours.  Increased to 4 L in the ED.  Has worsening dyspnea due to acute blood loss anemia.  Lungs clear on auscultation.  Chest x-ray negative. -Continuous pulse ox -Continue supplemental oxygen  Insulin-dependent diabetes mellitus -Check A1c.  Sliding scale insulin and CBG checks every 4 hours as patient is n.p.o.  Pulmonary nodule CT showing partially visualized 5 mm right lower lobe pulmonary nodule.  Patient is a former smoker with a 40-pack-year smoking history. -Repeat noncontrast chest CT will be needed in 12 months for follow-up  Code Status: Full Code Family Communication: Plan discussed with the patient. Disposition Plan: Remains inpatient Diet:  Diet Order            Diet clear liquid Room service appropriate? Yes; Fluid consistency: Thin  Diet effective now              Consultants   None  Procedures   None  Antibiotics   None (indicate start date, and stop date if known)   Objective   Vitals:   08/04/19 0000 08/04/19 0444  BP: (!) 144/92 136/74  Pulse: 86 92  Resp: 20 18  Temp: 97.9 F (  36.6 C) 97.9 F (36.6 C)  SpO2: 100% 99%    Intake/Output Summary (Last 24 hours) at 08/04/2019 1034 Last data filed at 08/04/2019 0600 Gross per 24 hour  Intake 1644.27 ml  Output 1950 ml  Net -305.73 ml   Filed Weights   08/02/19 1656  Weight: 134.7 kg    Physical examination  General: Alert and awake, oriented x3, not in any acute distress. HEENT: anicteric sclera, pupils reactive to light and  accommodation, EOMI CVS: S1-S2 clear, no murmur rubs or gallops Chest: clear to auscultation bilaterally, no wheezing, rales or rhonchi Abdomen: soft nontender, nondistended, normal bowel sounds, no organomegaly Extremities: no cyanosis, clubbing or edema noted bilaterally Neuro: Cranial nerves II-XII intact, no focal neurological deficits  Reviewed data  Basic Metabolic Panel: Recent Labs  Lab 08/02/19 1837 08/03/19 1647 08/04/19 0119 08/04/19 0507  NA 140 139 140 138  K 3.4* 3.4* 3.0* 3.1*  CL 101 100 102 102  CO2 30 29 29 29   GLUCOSE 186* 239* 183* 159*  BUN 33* 24* 21 20  CREATININE 1.59* 1.45* 1.39* 1.29*  CALCIUM 7.8* 7.5* 7.5* 7.4*  MG 1.2*  --  2.4 3.0*   Liver Function Tests: Recent Labs  Lab 08/02/19 1837  AST 21  ALT 12  ALKPHOS 127*  BILITOT 0.6  PROT 7.4  ALBUMIN 3.1*   No results for input(s): LIPASE, AMYLASE in the last 168 hours. No results for input(s): AMMONIA in the last 168 hours. CBC: Recent Labs  Lab 08/02/19 1837 08/03/19 1647 08/04/19 0119 08/04/19 0507  WBC 13.9* 8.6 8.7 8.8  HGB 4.6* 8.5* 8.3* 7.9*  HCT 15.2* 27.3* 26.9* 25.5*  MCV 92.7 91.6 92.4 92.1  PLT 225 192 182 185   Cardiac Enzymes: No results for input(s): CKTOTAL, CKMB, CKMBINDEX, TROPONINI in the last 168 hours. BNP (last 3 results) No results for input(s): BNP in the last 8760 hours.  ProBNP (last 3 results) No results for input(s): PROBNP in the last 8760 hours.  CBG: Recent Labs  Lab 08/03/19 1616 08/03/19 2120 08/03/19 2355 08/04/19 0643 08/04/19 0750  GLUCAP 227* 172* 157* 148* 155*    Micro Recent Results (from the past 240 hour(s))  SARS CORONAVIRUS 2 (TAT 6-24 HRS) Nasopharyngeal Nasopharyngeal Swab     Status: None   Collection Time: 08/02/19  6:41 PM   Specimen: Nasopharyngeal Swab  Result Value Ref Range Status   SARS Coronavirus 2 NEGATIVE NEGATIVE Final    Comment: (NOTE) SARS-CoV-2 target nucleic acids are NOT DETECTED. The SARS-CoV-2 RNA  is generally detectable in upper and lower respiratory specimens during the acute phase of infection. Negative results do not preclude SARS-CoV-2 infection, do not rule out co-infections with other pathogens, and should not be used as the sole basis for treatment or other patient management decisions. Negative results must be combined with clinical observations, patient history, and epidemiological information. The expected result is Negative. Fact Sheet for Patients: SugarRoll.be Fact Sheet for Healthcare Providers: https://www.woods-mathews.com/ This test is not yet approved or cleared by the [place] FDA and  has been authorized for detection and/or diagnosis of SARS-CoV-2 by FDA under an Emergency Use Authorization (EUA). This EUA will remain  in effect (meaning this test can be used) for the duration of the COVID-19 declaration under Section 56 4(b)(1) of the Act, 21 U.S.C. section 360bbb-3(b)(1), unless the authorization is terminated or revoked sooner. Performed at [facility], [place] [address], [place], [place] [address]  Radiological studies, reviewed  Dg Chest 2 View  Result Date: 08/02/2019 CLINICAL DATA:  Shortness of breath. EXAM: CHEST - 2 VIEW COMPARISON:  03/11/2019 FINDINGS: Both views are degraded by patient body habitus. Lateral view degraded by patient arm position. Numerous leads and wires project over the chest. Midline trachea. Normal heart size. Atherosclerosis in the transverse aorta. No pleural effusion or pneumothorax. No congestive failure. Clear lungs. IMPRESSION: No acute cardiopulmonary disease. Aortic Atherosclerosis (ICD10-I70.0). Electronically Signed   By: Abigail Miyamoto M.D.   On: 08/02/2019 18:26   Nm Gi Blood Loss  Result Date: 08/03/2019 CLINICAL DATA:  GI bleed. EXAM: NUCLEAR MEDICINE GASTROINTESTINAL BLEEDING SCAN TECHNIQUE: Sequential abdominal images were obtained following intravenous  administration of Tc-33m labeled red blood cells. RADIOPHARMACEUTICALS:  22.2 mCi Tc-81m pertechnetate in-vitro labeled red cells. COMPARISON:  CT yesterday. FINDINGS: Vague focal radiotracer accumulation initially in the central lower abdomen courses to the right of the level of the iliac vessels, beginning at minute 22 on first hour imaging. There is to and fro activity which then dissipates. During second hour imaging focal activity again accumulates just to the right of the iliac vessels in the similar location, and courses centrally., activity than again dissipates. IMPRESSION: Vague radiotracer accumulation in the central right lower abdomen with slight to and fro motion that appears intermittently and dissipates over the course of 2 hours. Findings suggest intermittent GI bleed. It is unclear if this is within tortuous sigmoid colon versus small bowel. These results will be called to the ordering clinician or representative by the Radiologist Assistant, and communication documented in the PACS or zVision Dashboard. Electronically Signed   By: Keith Rake M.D.   On: 08/03/2019 03:28   Ct Abdomen Pelvis W Contrast  Result Date: 08/02/2019 CLINICAL DATA:  Abdominal pain, rectal bleeding EXAM: CT ABDOMEN AND PELVIS WITH CONTRAST TECHNIQUE: Multidetector CT imaging of the abdomen and pelvis was performed using the standard protocol following bolus administration of intravenous contrast. CONTRAST:  156mL OMNIPAQUE IOHEXOL 300 MG/ML  SOLN COMPARISON:  03/08/2019 FINDINGS: Lower chest: Incompletely visualized 5 mm nodular density within the superior aspect of the right lower lobe (series 5, image 1). Lung bases otherwise clear. Hepatobiliary: No focal liver abnormality is seen. Multiple calcified stones within the gallbladder lumen. No evidence of gallbladder wall thickening, pericholecystic inflammation, or biliary dilatation. Pancreas: Unremarkable. No pancreatic ductal dilatation or surrounding  inflammatory changes. Spleen: Normal in size without focal abnormality. Adrenals/Urinary Tract: Adrenal glands are unremarkable. Kidneys are normal, without renal calculi, focal lesion, or hydronephrosis. Bladder is unremarkable. Stomach/Bowel: Stomach is within normal limits. Appendix appears normal. There are a few scattered diverticula of the sigmoid colon. No evidence of bowel wall thickening, distention, or inflammatory changes. Vascular/Lymphatic: Aortic atherosclerosis. Incidental note of retroaortic left renal vein. No enlarged abdominal or pelvic lymph nodes. Reproductive: Status post hysterectomy. No adnexal masses. Other: No free fluid within the abdomen or pelvis. Mild rectus diastasis. Musculoskeletal: No acute osseous abnormality. Chronic grade 1 anterolisthesis L4 on L5. IMPRESSION: 1. No acute abdominopelvic findings. 2. Uncomplicated cholelithiasis. 3. Mild sigmoid diverticulosis without acute diverticulitis. 4. Partially visualized 5 mm right lower lobe pulmonary nodule. No follow-up needed if patient is low-risk. Non-contrast chest CT can be considered in 12 months if patient is high-risk. This recommendation follows the consensus statement: Guidelines for Management of Incidental Pulmonary Nodules Detected on CT Images: From the Fleischner Society 2017; Radiology 2017; [id number]:228-243. Aortic Atherosclerosis (ICD10-I70.0). Electronically Signed   By: Marisue Brooklyn.D.  On: 08/02/2019 21:32    Scheduled Meds:  insulin aspart  0-9 Units Subcutaneous Q4H   potassium chloride  40 mEq Oral Q6H   Continuous Infusions:  sodium chloride 125 mL/hr at 08/04/19 0155     Time spent: 35 minutes  [facility] Pager [phone] If 7PM-7AM, please contact night-coverage at [email or web address], password [facility] 08/04/2019, 10:34 AM  LOS: 2 days

## 2019-08-04 NOTE — Progress Notes (Signed)
Subjective: Patient complains of fatigue, states she has not slept for 3 days, finished a gallon of breath, continue to have multiple maroon bowel movements.  Has not eaten in a few days except clear liquid.  Objective: Vital signs in last 24 hours: Temp:  [97.7 F (36.5 C)-98.5 F (36.9 C)] 97.9 F (36.6 C) (11/22 0444) Pulse Rate:  [86-92] 92 (11/22 0444) Resp:  [18-22] 18 (11/22 0444) BP: (111-151)/(57-97) 136/74 (11/22 0444) SpO2:  [94 %-100 %] 99 % (11/22 0444) Weight change:  Last BM Date: 08/02/19  PE: Morbidly obese, on oxygen 4 L via nasal cannula, mild shortness of breath, able to speak in few words not full sentences GENERAL: Lying on bed ABDOMEN: Nondistended EXTREMITIES: No deformity,obese extremities  Lab Results: Results for orders placed or performed during the hospital encounter of 08/02/19 (from the past 48 hour(s))  ABO/Rh     Status: None   Collection Time: 08/02/19  4:39 PM  Result Value Ref Range   ABO/RH(D)      O POS Performed at [facility] [address], [place], [place] [address]   POC occult blood, ED     Status: Abnormal   Collection Time: 08/02/19  5:45 PM  Result Value Ref Range   Fecal Occult Bld POSITIVE (A) NEGATIVE  Prepare RBC     Status: None   Collection Time: 08/02/19  6:36 PM  Result Value Ref Range   Order Confirmation      ORDER PROCESSED BY BLOOD BANK Performed at [facility] [address] [place], [place] [address]   Comprehensive metabolic panel     Status: Abnormal   Collection Time: 08/02/19  6:37 PM  Result Value Ref Range   Sodium 140 135 - 145 mmol/L   Potassium 3.4 (L) 3.5 - 5.1 mmol/L   Chloride 101 98 - 111 mmol/L   CO2 30 22 - 32 mmol/L   Glucose, Bld 186 (H) 70 - 99 mg/dL   BUN 33 (H) 8 - 23 mg/dL   Creatinine, Ser 1.59 (H) 0.44 - 1.00 mg/dL   Calcium 7.8 (L) 8.9 - 10.3 mg/dL   Total Protein 7.4 6.5 - 8.1 g/dL   Albumin 3.1 (L) 3.5 - 5.0 g/dL   AST 21 15 - 41 U/L    ALT 12 0 - 44 U/L   Alkaline Phosphatase 127 (H) 38 - 126 U/L   Total Bilirubin 0.6 0.3 - 1.2 mg/dL   GFR calc non Af Amer 33 (L) >60 mL/min   GFR calc Af Amer 39 (L) >60 mL/min   Anion gap 9 5 - 15    Comment: Performed at [facility], [place] [address] [place], [place] [address]  CBC     Status: Abnormal   Collection Time: 08/02/19  6:37 PM  Result Value Ref Range   WBC 13.9 (H) 4.0 - 10.5 K/uL   RBC 1.64 (L) 3.87 - 5.11 MIL/uL   Hemoglobin 4.6 (LL) 12.0 - 15.0 g/dL    Comment: REPEATED TO VERIFY THIS CRITICAL RESULT HAS VERIFIED AND BEEN CALLED TO J.[place] BY NATHAN THOMPSON ON 11 20 2020 AT 1911, AND HAS BEEN READ BACK. CRITICAL RESULT VERIFIED    HCT 15.2 (L) 36.0 - 46.0 %   MCV 92.7 80.0 - 100.0 fL   MCH 28.0 26.0 - 34.0 pg   MCHC 30.3 30.0 - 36.0 g/dL   RDW 15.1 11.5 - 15.5 %   Platelets 225 150 - 400 K/uL  nRBC 0.4 (H) 0.0 - 0.2 %    Comment: Performed at [facility], [place] [address] [place], [place] [address]  Type and screen [place]     Status: None   Collection Time: 08/02/19  6:37 PM  Result Value Ref Range   ABO/RH(D) O POS    Antibody Screen NEG    Sample Expiration 08/05/2019,2359    Unit Number [id number]    Blood Component Type RED CELLS,LR    Unit division 00    Status of Unit ISSUED,FINAL    Transfusion Status OK TO TRANSFUSE    Crossmatch Result Compatible    Unit Number [id number]    Blood Component Type RED CELLS,LR    Unit division 00    Status of Unit ISSUED,FINAL    Transfusion Status OK TO TRANSFUSE    Crossmatch Result      Compatible Performed at [facility], [place] [address], [place], [place] [address]    Unit Number [id number]    Blood Component Type RED CELLS,LR    Unit division 00    Status of Unit ISSUED,FINAL    Transfusion Status OK TO TRANSFUSE    Crossmatch Result Compatible   Protime-INR     Status: Abnormal   Collection Time:  08/02/19  6:37 PM  Result Value Ref Range   Prothrombin Time 25.7 (H) 11.4 - 15.2 seconds   INR 2.4 (H) 0.8 - 1.2    Comment: (NOTE) INR goal varies based on device and disease states. Performed at [facility], [place] [address], [place], [place] [address]   Magnesium     Status: Abnormal   Collection Time: 08/02/19  6:37 PM  Result Value Ref Range   Magnesium 1.2 (L) 1.7 - 2.4 mg/dL    Comment: Performed at [facility], [place] [address] [place], [place] [address]  SARS CORONAVIRUS 2 (TAT 6-24 HRS) Nasopharyngeal Nasopharyngeal Swab     Status: None   Collection Time: 08/02/19  6:41 PM   Specimen: Nasopharyngeal Swab  Result Value Ref Range   SARS Coronavirus 2 NEGATIVE NEGATIVE    Comment: (NOTE) SARS-CoV-2 target nucleic acids are NOT DETECTED. The SARS-CoV-2 RNA is generally detectable in upper and lower respiratory specimens during the acute phase of infection. Negative results do not preclude SARS-CoV-2 infection, do not rule out co-infections with other pathogens, and should not be used as the sole basis for treatment or other patient management decisions. Negative results must be combined with clinical observations, patient history, and epidemiological information. The expected result is Negative. Fact Sheet for Patients: SugarRoll.be Fact Sheet for Healthcare Providers: https://www.woods-mathews.com/ This test is not yet approved or cleared by the [place] FDA and  has been authorized for detection and/or diagnosis of SARS-CoV-2 by FDA under an Emergency Use Authorization (EUA). This EUA will remain  in effect (meaning this test can be used) for the duration of the COVID-19 declaration under Section 56 4(b)(1) of the Act, 21 U.S.C. section 360bbb-3(b)(1), unless the authorization is terminated or revoked sooner. Performed at [facility], [place] [address], [place],   [address]   Prepare fresh frozen plasma     Status: None   Collection Time: 08/02/19 10:08 PM  Result Value Ref Range   Unit Number [id number]    Blood Component Type THAWED PLASMA    Unit division 00    Status of Unit ISSUED,FINAL    Transfusion Status  OK TO TRANSFUSE Performed at [facility] [address], [place], [place] [address]   Glucose, capillary     Status: Abnormal   Collection Time: 08/03/19  3:36 AM  Result Value Ref Range   Glucose-Capillary 110 (H) 70 - 99 mg/dL   Comment 1 Notify RN   Glucose, capillary     Status: Abnormal   Collection Time: 08/03/19  8:12 AM  Result Value Ref Range   Glucose-Capillary 130 (H) 70 - 99 mg/dL  Glucose, capillary     Status: Abnormal   Collection Time: 08/03/19  4:16 PM  Result Value Ref Range   Glucose-Capillary 227 (H) 70 - 99 mg/dL  CBC     Status: Abnormal   Collection Time: 08/03/19  4:47 PM  Result Value Ref Range   WBC 8.6 4.0 - 10.5 K/uL   RBC 2.98 (L) 3.87 - 5.11 MIL/uL   Hemoglobin 8.5 (L) 12.0 - 15.0 g/dL    Comment: POST TRANSFUSION SPECIMEN   HCT 27.3 (L) 36.0 - 46.0 %   MCV 91.6 80.0 - 100.0 fL   MCH 28.5 26.0 - 34.0 pg   MCHC 31.1 30.0 - 36.0 g/dL   RDW 14.9 11.5 - 15.5 %   Platelets 192 150 - 400 K/uL   nRBC 0.6 (H) 0.0 - 0.2 %    Comment: Performed at [facility], [place] [address] [place], [place] [id number]  Basic metabolic panel     Status: Abnormal   Collection Time: 08/03/19  4:47 PM  Result Value Ref Range   Sodium 139 135 - 145 mmol/L   Potassium 3.4 (L) 3.5 - 5.1 mmol/L   Chloride 100 98 - 111 mmol/L   CO2 29 22 - 32 mmol/L   Glucose, Bld 239 (H) 70 - 99 mg/dL   BUN 24 (H) 8 - 23 mg/dL   Creatinine, Ser 1.45 (H) 0.44 - 1.00 mg/dL   Calcium 7.5 (L) 8.9 - 10.3 mg/dL   GFR calc non Af Amer 37 (L) >60 mL/min   GFR calc Af Amer 43 (L) >60 mL/min   Anion gap 10 5 - 15    Comment: Performed at [facility], [place] [name],  [place], [place] [address]  Protime-INR     Status: Abnormal   Collection Time: 08/03/19  4:47 PM  Result Value Ref Range   Prothrombin Time 20.6 (H) 11.4 - 15.2 seconds   INR 1.8 (H) 0.8 - 1.2    Comment: (NOTE) INR goal varies based on device and disease states. Performed at [facility], [place] [address] [place], [place] [address]   Glucose, capillary     Status: Abnormal   Collection Time: 08/03/19  9:20 PM  Result Value Ref Range   Glucose-Capillary 172 (H) 70 - 99 mg/dL  Glucose, capillary     Status: Abnormal   Collection Time: 08/03/19 11:55 PM  Result Value Ref Range   Glucose-Capillary 157 (H) 70 - 99 mg/dL  CBC     Status: Abnormal   Collection Time: 08/04/19  1:19 AM  Result Value Ref Range   WBC 8.7 4.0 - 10.5 K/uL   RBC 2.91 (L) 3.87 - 5.11 MIL/uL   Hemoglobin 8.3 (L) 12.0 - 15.0 g/dL   HCT 26.9 (L) 36.0 - 46.0 %   MCV 92.4 80.0 - 100.0 fL   MCH 28.5 26.0 - 34.0 pg   MCHC 30.9 30.0 - 36.0 g/dL   RDW 15.1 11.5 -  15.5 %   Platelets 182 150 - 400 K/uL   nRBC 0.6 (H) 0.0 - 0.2 %    Comment: Performed at [facility], [place] [address] [place], [place] [id number]  Basic metabolic panel     Status: Abnormal   Collection Time: 08/04/19  1:19 AM  Result Value Ref Range   Sodium 140 135 - 145 mmol/L   Potassium 3.0 (L) 3.5 - 5.1 mmol/L   Chloride 102 98 - 111 mmol/L   CO2 29 22 - 32 mmol/L   Glucose, Bld 183 (H) 70 - 99 mg/dL   BUN 21 8 - 23 mg/dL   Creatinine, Ser 1.39 (H) 0.44 - 1.00 mg/dL   Calcium 7.5 (L) 8.9 - 10.3 mg/dL   GFR calc non Af Amer 39 (L) >60 mL/min   GFR calc Af Amer 45 (L) >60 mL/min   Anion gap 9 5 - 15    Comment: Performed at [facility] [address] [place], [place] [address]  Magnesium     Status: None   Collection Time: 08/04/19  1:19 AM  Result Value Ref Range   Magnesium 2.4 1.7 - 2.4 mg/dL    Comment: Performed at [facility], [place] [address], [place],  [place] [address]  Hemoglobin A1c     Status: Abnormal   Collection Time: 08/04/19  5:07 AM  Result Value Ref Range   Hgb A1c MFr Bld 6.5 (H) 4.8 - 5.6 %    Comment: (NOTE) Pre diabetes:          5.7%-6.4% Diabetes:              >6.4% Glycemic control for   <7.0% adults with diabetes    Mean Plasma Glucose 139.85 mg/dL    Comment: Performed at [facility] [address], [place] [address]  BMP daily     Status: Abnormal   Collection Time: 08/04/19  5:07 AM  Result Value Ref Range   Sodium 138 135 - 145 mmol/L   Potassium 3.1 (L) 3.5 - 5.1 mmol/L   Chloride 102 98 - 111 mmol/L   CO2 29 22 - 32 mmol/L   Glucose, Bld 159 (H) 70 - 99 mg/dL   BUN 20 8 - 23 mg/dL   Creatinine, Ser 1.29 (H) 0.44 - 1.00 mg/dL   Calcium 7.4 (L) 8.9 - 10.3 mg/dL   GFR calc non Af Amer 43 (L) >60 mL/min   GFR calc Af Amer 50 (L) >60 mL/min   Anion gap 7 5 - 15    Comment: Performed at [facility] [address], [place], [place] [address]  CBC in AM     Status: Abnormal   Collection Time: 08/04/19  5:07 AM  Result Value Ref Range   WBC 8.8 4.0 - 10.5 K/uL   RBC 2.77 (L) 3.87 - 5.11 MIL/uL   Hemoglobin 7.9 (L) 12.0 - 15.0 g/dL   HCT 25.5 (L) 36.0 - 46.0 %   MCV 92.1 80.0 - 100.0 fL   MCH 28.5 26.0 - 34.0 pg   MCHC 31.0 30.0 - 36.0 g/dL   RDW 15.0 11.5 - 15.5 %   Platelets 185 150 - 400 K/uL   nRBC 0.5 (H) 0.0 - 0.2 %    Comment: Performed at [facility], [place] [address], [place], [place] [address]  Magnesium in AM     Status: Abnormal   Collection Time: 08/04/19  5:07 AM  Result Value Ref Range   Magnesium 3.0 (H) 1.7 - 2.4 mg/dL    Comment: Performed at [facility], [place] [address], [place], [place] [address]  Glucose, capillary     Status: Abnormal   Collection Time: 08/04/19  6:43 AM  Result Value Ref Range   Glucose-Capillary 148 (H) 70 - 99 mg/dL  Glucose, capillary     Status: Abnormal   Collection Time: 08/04/19  7:50 AM   Result Value Ref Range   Glucose-Capillary 155 (H) 70 - 99 mg/dL    Studies/Results: Dg Chest 2 View  Result Date: 08/02/2019 CLINICAL DATA:  Shortness of breath. EXAM: CHEST - 2 VIEW COMPARISON:  03/11/2019 FINDINGS: Both views are degraded by patient body habitus. Lateral view degraded by patient arm position. Numerous leads and wires project over the chest. Midline trachea. Normal heart size. Atherosclerosis in the transverse aorta. No pleural effusion or pneumothorax. No congestive failure. Clear lungs. IMPRESSION: No acute cardiopulmonary disease. Aortic Atherosclerosis (ICD10-I70.0). Electronically Signed   By: Abigail Miyamoto M.D.   On: 08/02/2019 18:26   Nm Gi Blood Loss  Result Date: 08/03/2019 CLINICAL DATA:  GI bleed. EXAM: NUCLEAR MEDICINE GASTROINTESTINAL BLEEDING SCAN TECHNIQUE: Sequential abdominal images were obtained following intravenous administration of Tc-9m labeled red blood cells. RADIOPHARMACEUTICALS:  22.2 mCi Tc-46m pertechnetate in-vitro labeled red cells. COMPARISON:  CT yesterday. FINDINGS: Vague focal radiotracer accumulation initially in the central lower abdomen courses to the right of the level of the iliac vessels, beginning at minute 22 on first hour imaging. There is to and fro activity which then dissipates. During second hour imaging focal activity again accumulates just to the right of the iliac vessels in the similar location, and courses centrally., activity than again dissipates. IMPRESSION: Vague radiotracer accumulation in the central right lower abdomen with slight to and fro motion that appears intermittently and dissipates over the course of 2 hours. Findings suggest intermittent GI bleed. It is unclear if this is within tortuous sigmoid colon versus small bowel. These results will be called to the ordering clinician or representative by the Radiologist Assistant, and communication documented in the PACS or zVision Dashboard. Electronically Signed   By:  Keith Rake M.D.   On: 08/03/2019 03:28   Ct Abdomen Pelvis W Contrast  Result Date: 08/02/2019 CLINICAL DATA:  Abdominal pain, rectal bleeding EXAM: CT ABDOMEN AND PELVIS WITH CONTRAST TECHNIQUE: Multidetector CT imaging of the abdomen and pelvis was performed using the standard protocol following bolus administration of intravenous contrast. CONTRAST:  140mL OMNIPAQUE IOHEXOL 300 MG/ML  SOLN COMPARISON:  03/08/2019 FINDINGS: Lower chest: Incompletely visualized 5 mm nodular density within the superior aspect of the right lower lobe (series 5, image 1). Lung bases otherwise clear. Hepatobiliary: No focal liver abnormality is seen. Multiple calcified stones within the gallbladder lumen. No evidence of gallbladder wall thickening, pericholecystic inflammation, or biliary dilatation. Pancreas: Unremarkable. No pancreatic ductal dilatation or surrounding inflammatory changes. Spleen: Normal in size without focal abnormality. Adrenals/Urinary Tract: Adrenal glands are unremarkable. Kidneys are normal, without renal calculi, focal lesion, or hydronephrosis. Bladder is unremarkable. Stomach/Bowel: Stomach is within normal limits. Appendix appears normal. There are a few scattered diverticula of the sigmoid colon. No evidence of bowel wall thickening, distention, or inflammatory changes. Vascular/Lymphatic: Aortic atherosclerosis. Incidental note of retroaortic left renal vein. No enlarged abdominal or pelvic lymph nodes. Reproductive: Status post hysterectomy. No adnexal masses. Other: No free fluid within the abdomen or pelvis. Mild rectus diastasis. Musculoskeletal: No  acute osseous abnormality. Chronic grade 1 anterolisthesis L4 on L5. IMPRESSION: 1. No acute abdominopelvic findings. 2. Uncomplicated cholelithiasis. 3. Mild sigmoid diverticulosis without acute diverticulitis. 4. Partially visualized 5 mm right lower lobe pulmonary nodule. No follow-up needed if patient is low-risk. Non-contrast chest CT can  be considered in 12 months if patient is high-risk. This recommendation follows the consensus statement: Guidelines for Management of Incidental Pulmonary Nodules Detected on CT Images: From the Fleischner Society 2017; Radiology 2017; 284:228-243. Aortic Atherosclerosis (ICD10-I70.0). Electronically Signed   By: Davina Poke M.D.   On: 08/02/2019 21:32    Medications: I have reviewed the patient's current medications.  Assessment: Painless hematochezia in the setting of Coumadin use, with sigmoid diverticulosis noted on CAT scan and no prior colonoscopy. Patient has multiple comorbidities-is morbidly obese, BMI of 52.62, 135 kg, has COPD oxygen dependent, will likely not be able to get an outpatient colonoscopy So far has received 3 units PRBC, 1 unit FFP and vitamin K 1 mg IV Hb has improved from 4.6 on admission to 7.9 but has ongoing hematochezia with colonic prep, however remains hemodynamically stable. INR has improved to 1.8.  Plan: Will give another gallon of GoLYTELY, with plans for diagnostic colonoscopy if deemed stable enough to receive anesthesia. Bleeding scan showed intermittent bleeding in the area of sigmoid colon/small bowel. If patient not deemed safe for anesthesia due to multiple underlying comorbidities, may need IR guided embolization(to be noted -patient has a GFR of 50, creatinine of 1.29). H&H every 12 hours with plans to transfuse PRBC if hemoglobin is less than 7.  [name], MD 08/04/2019, 11:00 AM

## 2019-08-05 ENCOUNTER — Inpatient Hospital Stay (HOSPITAL_COMMUNITY): Payer: Medicare HMO | Admitting: Anesthesiology

## 2019-08-05 ENCOUNTER — Encounter (HOSPITAL_COMMUNITY): Payer: Self-pay | Admitting: Anesthesiology

## 2019-08-05 ENCOUNTER — Encounter (HOSPITAL_COMMUNITY)
Admission: EM | Disposition: A | Discharge status: Home / Self Care | Payer: Self-pay | Source: Home / Self Care | Attending: Internal Medicine

## 2019-08-05 DIAGNOSIS — I824Y2 Acute embolism and thrombosis of unspecified deep veins of left proximal lower extremity: Secondary | ICD-10-CM

## 2019-08-05 DIAGNOSIS — R9431 Abnormal electrocardiogram [ECG] [EKG]: Secondary | ICD-10-CM

## 2019-08-05 DIAGNOSIS — K922 Gastrointestinal hemorrhage, unspecified: Secondary | ICD-10-CM

## 2019-08-05 DIAGNOSIS — D649 Anemia, unspecified: Secondary | ICD-10-CM

## 2019-08-05 DIAGNOSIS — E1165 Type 2 diabetes mellitus with hyperglycemia: Secondary | ICD-10-CM

## 2019-08-05 HISTORY — PX: COLONOSCOPY WITH PROPOFOL: SHX5780

## 2019-08-05 LAB — PREPARE RBC (CROSSMATCH)

## 2019-08-05 LAB — GLUCOSE, CAPILLARY
Glucose-Capillary: 145 mg/dL — ABNORMAL HIGH (ref 70–99)
Glucose-Capillary: 199 mg/dL — ABNORMAL HIGH (ref 70–99)
Glucose-Capillary: 196 mg/dL — ABNORMAL HIGH (ref 70–99)
Glucose-Capillary: 115 mg/dL — ABNORMAL HIGH (ref 70–99)
Glucose-Capillary: 121 mg/dL — ABNORMAL HIGH (ref 70–99)
Glucose-Capillary: 157 mg/dL — ABNORMAL HIGH (ref 70–99)
Glucose-Capillary: 192 mg/dL — ABNORMAL HIGH (ref 70–99)
Glucose-Capillary: 196 mg/dL — ABNORMAL HIGH (ref 70–99)
Glucose-Capillary: 128 mg/dL — ABNORMAL HIGH (ref 70–99)

## 2019-08-05 LAB — BASIC METABOLIC PANEL
Anion gap: 9 (ref 5–15)
BUN: 10 mg/dL (ref 8–23)
CO2: 24 mmol/L (ref 22–32)
Calcium: 7.3 mg/dL — ABNORMAL LOW (ref 8.9–10.3)
Chloride: 103 mmol/L (ref 98–111)
Creatinine, Ser: 1.25 mg/dL — ABNORMAL HIGH (ref 0.44–1.00)
GFR calc Af Amer: 52 mL/min — ABNORMAL LOW (ref >60)
GFR calc non Af Amer: 44 mL/min — ABNORMAL LOW (ref >60)
Glucose, Bld: 148 mg/dL — ABNORMAL HIGH (ref 70–99)
Potassium: 3.4 mmol/L — ABNORMAL LOW (ref 3.5–5.1)
Sodium: 136 mmol/L (ref 135–145)

## 2019-08-05 LAB — HEMOGLOBIN AND HEMATOCRIT, BLOOD
HCT: 21.1 % — ABNORMAL LOW (ref 36.0–46.0)
HCT: 21.8 % — ABNORMAL LOW (ref 36.0–46.0)
Hemoglobin: 6.4 g/dL — CL (ref 12.0–15.0)
Hemoglobin: 6.6 g/dL — CL (ref 12.0–15.0)

## 2019-08-05 LAB — CBC
HCT: 26.7 % — ABNORMAL LOW (ref 36.0–46.0)
Hemoglobin: 8 g/dL — ABNORMAL LOW (ref 12.0–15.0)
MCH: 28.4 pg (ref 26.0–34.0)
MCHC: 30 g/dL (ref 30.0–36.0)
MCV: 94.7 fL (ref 80.0–100.0)
Platelets: 215 10*3/uL (ref 150–400)
RBC: 2.82 MIL/uL — ABNORMAL LOW (ref 3.87–5.11)
RDW: 15.1 % (ref 11.5–15.5)
WBC: 9.6 10*3/uL (ref 4.0–10.5)
nRBC: 0.4 % — ABNORMAL HIGH (ref 0.0–0.2)

## 2019-08-05 LAB — PROTIME-INR
INR: 1.5 — ABNORMAL HIGH (ref 0.8–1.2)
Prothrombin Time: 18.4 seconds — ABNORMAL HIGH (ref 11.4–15.2)

## 2019-08-05 LAB — MAGNESIUM: Magnesium: 2.2 mg/dL (ref 1.7–2.4)

## 2019-08-05 SURGERY — COLONOSCOPY WITH PROPOFOL
Anesthesia: General

## 2019-08-05 MED ORDER — LIDOCAINE HCL (CARDIAC) PF 100 MG/5ML IV SOSY
PREFILLED_SYRINGE | INTRAVENOUS | Status: DC | PRN
  Administered 2019-08-05: 80 mg via INTRAVENOUS

## 2019-08-05 MED ORDER — LEVOTHYROXINE SODIUM 50 MCG PO TABS
50.0000 ug | ORAL_TABLET | Freq: Every day | ORAL | Status: DC
  Administered 2019-08-06 – 2019-08-08 (×3): 50 ug via ORAL
  Filled 2019-08-05 (×3): qty 1

## 2019-08-05 MED ORDER — PROPOFOL 500 MG/50ML IV EMUL
INTRAVENOUS | Status: DC | PRN
  Administered 2019-08-05: 75 ug/kg/min via INTRAVENOUS

## 2019-08-05 MED ORDER — LORAZEPAM 1 MG PO TABS: 1.5000 mg | ORAL_TABLET | Freq: Every evening | ORAL | Status: DC | PRN

## 2019-08-05 MED ORDER — BUSPIRONE HCL 5 MG PO TABS
15.0000 mg | ORAL_TABLET | Freq: Every day | ORAL | Status: DC
  Administered 2019-08-05 – 2019-08-08 (×4): 15 mg via ORAL
  Filled 2019-08-05 (×4): qty 3

## 2019-08-05 MED ORDER — PROPOFOL 500 MG/50ML IV EMUL
INTRAVENOUS | Status: AC
  Filled 2019-08-05: qty 50

## 2019-08-05 MED ORDER — LACTATED RINGERS IV SOLN
INTRAVENOUS | Status: DC | PRN
  Administered 2019-08-05: 09:00:00 via INTRAVENOUS

## 2019-08-05 MED ORDER — ONDANSETRON HCL 4 MG/2ML IJ SOLN
INTRAMUSCULAR | Status: DC | PRN
  Administered 2019-08-05: 4 mg via INTRAVENOUS

## 2019-08-05 MED ORDER — POLYETHYLENE GLYCOL 3350 17 G PO PACK
17.0000 g | PACK | Freq: Three times a day (TID) | ORAL | Status: DC
  Administered 2019-08-06: 17 g via ORAL
  Filled 2019-08-05 (×3): qty 1

## 2019-08-05 MED ORDER — PHENYLEPHRINE HCL (PRESSORS) 10 MG/ML IV SOLN
INTRAVENOUS | Status: DC | PRN
  Administered 2019-08-05 (×2): 80 ug via INTRAVENOUS

## 2019-08-05 MED ORDER — PROPOFOL 500 MG/50ML IV EMUL
INTRAVENOUS | Status: DC | PRN
  Administered 2019-08-05 (×2): 10 mg via INTRAVENOUS

## 2019-08-05 MED ORDER — SODIUM CHLORIDE 0.9 % IV SOLN: INTRAVENOUS | Status: DC

## 2019-08-05 MED ORDER — METOPROLOL SUCCINATE ER 100 MG PO TB24
100.0000 mg | ORAL_TABLET | Freq: Every day | ORAL | Status: DC
  Administered 2019-08-05 – 2019-08-08 (×4): 100 mg via ORAL
  Filled 2019-08-05 (×5): qty 1

## 2019-08-05 MED ORDER — PROPOFOL 10 MG/ML IV BOLUS
INTRAVENOUS | Status: AC
  Filled 2019-08-05: qty 20

## 2019-08-05 MED ORDER — LISINOPRIL 20 MG PO TABS
20.0000 mg | ORAL_TABLET | Freq: Every day | ORAL | Status: DC
  Administered 2019-08-05 – 2019-08-08 (×4): 20 mg via ORAL
  Filled 2019-08-05 (×5): qty 1

## 2019-08-05 MED ORDER — BUSPIRONE HCL 5 MG PO TABS
15.0000 mg | ORAL_TABLET | Freq: Every day | ORAL | Status: DC
  Filled 2019-08-05: qty 1

## 2019-08-05 MED ORDER — SODIUM CHLORIDE 0.9% IV SOLUTION: Freq: Once | INTRAVENOUS | Status: DC

## 2019-08-05 MED ORDER — LORAZEPAM 0.5 MG PO TABS
0.5000 mg | ORAL_TABLET | Freq: Every day | ORAL | Status: DC | PRN
  Administered 2019-08-05: 14:00:00 0.5 mg via ORAL
  Filled 2019-08-05: qty 1

## 2019-08-05 SURGICAL SUPPLY — 22 items

## 2019-08-05 NOTE — Interval H&P Note (Signed)
History and Physical Interval Note:  08/05/2019 9:26 AM  Sue [name]  has presented today for surgery, with the diagnosis of HEMATOCHEZIA.  The various methods of treatment have been discussed with the patient and family. After consideration of risks, benefits and other options for treatment, the patient has consented to  Procedure(s): COLONOSCOPY WITH PROPOFOL (N/A) as a surgical intervention.  The patient's history has been reviewed, patient examined, no change in status, stable for surgery.  I have reviewed the patient's chart and labs.  Questions were answered to the patient's satisfaction.     [name]

## 2019-08-05 NOTE — Progress Notes (Signed)
PROGRESS NOTE    Sue [name]  [id number] DOB: 1951-01-03 DOA: 08/02/2019 PCP: Ann Held, DO    Brief Narrative:   Sue [name] is a 68 year old female with history of recent DVT on Coumadin, chronic hypoxic respiratory failure on 3 L oxygen at baseline, insulin-dependent diabetes mellitus type 2, presented with 2-day history of bright red blood per rectum.  Recently started on Coumadin in June 2020.  Hemoglobin was noted to be 4.6 on admission with an INR of 2.4.  EDP consulted gastroenterology and requested TRH for admission for likely lower GI bleed.   Assessment & Plan:   Principal Problem:   GI bleed Active Problems:   DVT (deep venous thrombosis) (HCC)   Diabetes mellitus type II, uncontrolled (HCC)   Symptomatic anemia   QT prolongation   Lower GI bleed, suspect diverticular Acute blood loss anemia Patient presenting with 2-day history of bright red blood per rectum, complicated by recent diagnosis of DVT currently on Coumadin.  Hemoglobin 4.6 on admission with an INR of 2.4.  Transfused 3 units PRBCs, 1 unit FFP, and received IV vitamin K.  Nuclear med bleeding scan notable for right lower abdomen concerning for active GI bleed correlating to the sigmoid versus small bowel. --Gastroenterology following, appreciate assistance --Colonoscopy 08/05/2019 with findings of red blood found in the entire colon with scattered small mouth diverticula, internal hemorrhoids, multiple maroon-colored clots left colon with poor visualization and despite repeated irrigation and aspiration cannot pinpoint active bleeding site. --Hgb drop from 8.0 to 6.4 this afternoon following colonoscopy --INR 1.5 this morning. --Will transfuse 2 units PRBCs this evening --IR consulted for evaluation with possible angiogram and embolization  Type 2 diabetes mellitus, insulin-dependent Hemoglobin A1c, 6.5, well controlled.  Glipizide 5 mg p.o. twice daily, Metformin 1000 mg p.o. twice  daily, units subcutaneously daily. --Holding home medications at this time --Continue insulin sliding scale for coverage  Left lower extremity DVT, June 2020, provoked Secondary to fracture and poor mobility.  INR 2.4 on admission, within normal range.  Was reversed with IV vitamin K and FFP secondary to GI bleed as above. --Continue to hold Coumadin given acute GI bleed as above  Emphysema and chronic hypoxic respiratory failure Uses 3 L supplemental oxygen at home 24 hours. Increased to 4 L in the ED. Has worsening dyspnea due to acute blood loss anemia. Lungs clear on auscultation. Chest x-ray negative. --Currently on 4 L nasal cannula, oxygenating 100% --Continue supplemental oxygen, titrate for SPO2 greater than 88%  Insulin-dependent diabetes mellitus -Check A1c. Sliding scale insulin and CBG checks every 4 hours as patient is n.p.o.  Prolonged QTC QTC noted to be 526.  Holding home Seroquel for now.  Depression/anxiety --Continue buspirone 15 mg p.o. daily, Ativan prn --Holding home Seroquel secondary to prolonged QTC  Pulmonary nodule CT showing partially visualized 5 mm right lower lobe pulmonary nodule.Patient is a former smoker with a 40-pack-year smoking history. Will need repeat noncontrast chest CT in 12 months for follow-up  Morbid obesity: BMI 52.60, discussed with patient need for aggressive lifestyle changes and weight loss as this complicates all facets of care.   DVT prophylaxis: SCDs, chemical DVT prophylaxis contraindicated in acute GI bleed Code Status: Full code Family Communication:  Disposition Plan: Remain inpatient, further depending on clinical course   Consultants:   Gastroenterology  Procedures:  Colonoscopy 08/05/2019: Impression:                - Preparation of the colon was unsatisfactory. -  Preparation of the colon was inadequate. - Blood in the entire examined colon. - Diverticulosis in the sigmoid colon. - Internal  hemorrhoids. - The examined portion of the ileum was normal. - No specimens collected.  Recommendation:            - Clear liquid diet. - Miralax to flush the remaining blood and clots from the colonoscopy. If rebleeds then will need an  angiogram with possible emoblization. Continue to hold anticoagulation. - Repeat colonoscopy as an outpt because the bowel preparation was poor.   Antimicrobials:   None   Subjective: Patient seen and examined bedside, resting comfortably.  Just returned from colonoscopy.  Denies any further bloody bowel movements, but none since and prep.  Reviewed operative report notable for red blood throughout the entire colon without active bleed source identified.  Patient noted to have hemoglobin drop later this evening from 8.0-6.4; will transfuse 2 units PRBCs and consult IR for consideration of angiogram with embolization.  No other complaints or concerns at this time.  Currently denies headache, no dizziness, no chest pain, palpitations, no nausea vomiting, no abdominal pain, no weakness, no fatigue, no paresthesias.  No acute events overnight per nursing staff.  Objective: Vitals:   08/05/19 1035 08/05/19 1040 08/05/19 1105 08/05/19 1332  BP: (!) 163/74 (!) 149/60 136/70 122/76  Pulse: (!) 110 (!) 107 99 (!) 107  Resp: (!) 26 (!) 31 20 18   Temp: 98.3 F (36.8 C)  97.8 F (36.6 C) 98.4 F (36.9 C)  TempSrc: Temporal  Oral Oral  SpO2: 99% 100% 100% 100%  Weight:      Height:        Intake/Output Summary (Last 24 hours) at 08/05/2019 1900 Last data filed at 08/05/2019 1300 Gross per 24 hour  Intake 560 ml  Output -  Net 560 ml   Filed Weights   08/02/19 1656 08/05/19 0835  Weight: 134.7 kg 134.7 kg    Examination:  General exam: Appears calm and comfortable, obese Respiratory system: Clear to auscultation. Respiratory effort normal. Cardiovascular system: S1 & S2 heard, RRR. No JVD, murmurs, rubs, gallops or clicks. No pedal edema.  Gastrointestinal system: Abdomen is nondistended, soft and nontender. No organomegaly or masses felt. Normal bowel sounds heard. Central nervous system: Alert and oriented. No focal neurological deficits. Extremities: Symmetric 5 x 5 power. Skin: No rashes, lesions or ulcers Psychiatry: Judgement and insight appear normal. Mood & affect appropriate.     Data Reviewed: I have personally reviewed following labs and imaging studies  CBC: Recent Labs  Lab 08/02/19 1837 08/03/19 1647 08/04/19 0119 08/04/19 0507 08/04/19 1640 08/05/19 0459 08/05/19 1706  WBC 13.9* 8.6 8.7 8.8  --  9.6  --   HGB 4.6* 8.5* 8.3* 7.9* 7.6* 8.0* 6.4*  HCT 15.2* 27.3* 26.9* 25.5* 24.8* 26.7* 21.1*  MCV 92.7 91.6 92.4 92.1  --  94.7  --   PLT 225 192 182 185  --  215  --    Basic Metabolic Panel: Recent Labs  Lab 08/02/19 1837 08/03/19 1647 08/04/19 0119 08/04/19 0507 08/05/19 0459  NA 140 139 140 138 136  K 3.4* 3.4* 3.0* 3.1* 3.4*  CL 101 100 102 102 103  CO2 30 29 29 29 24   GLUCOSE 186* 239* 183* 159* 148*  BUN 33* 24* 21 20 10   CREATININE 1.59* 1.45* 1.39* 1.29* 1.25*  CALCIUM 7.8* 7.5* 7.5* 7.4* 7.3*  MG 1.2*  --  2.4 3.0* 2.2   GFR: Estimated Creatinine  Clearance: 58.8 mL/min (A) (by C-G formula based on SCr of 1.25 mg/dL (H)). Liver Function Tests: Recent Labs  Lab 08/02/19 1837  AST 21  ALT 12  ALKPHOS 127*  BILITOT 0.6  PROT 7.4  ALBUMIN 3.1*   No results for input(s): LIPASE, AMYLASE in the last 168 hours. No results for input(s): AMMONIA in the last 168 hours. Coagulation Profile: Recent Labs  Lab 08/02/19 1837 08/03/19 1647 08/05/19 0459  INR 2.4* 1.8* 1.5*   Cardiac Enzymes: No results for input(s): CKTOTAL, CKMB, CKMBINDEX, TROPONINI in the last 168 hours. BNP (last 3 results) No results for input(s): PROBNP in the last 8760 hours. HbA1C: Recent Labs    08/04/19 0507  HGBA1C 6.5*   CBG: Recent Labs  Lab 08/04/19 2356 08/05/19 0433 08/05/19 0759  08/05/19 1204 08/05/19 1659  GLUCAP 99 121* 157* 192* 196*   Lipid Profile: No results for input(s): CHOL, HDL, LDLCALC, TRIG, CHOLHDL, LDLDIRECT in the last 72 hours. Thyroid Function Tests: No results for input(s): TSH, T4TOTAL, FREET4, T3FREE, THYROIDAB in the last 72 hours. Anemia Panel: No results for input(s): VITAMINB12, FOLATE, FERRITIN, TIBC, IRON, RETICCTPCT in the last 72 hours. Sepsis Labs: No results for input(s): PROCALCITON, LATICACIDVEN in the last 168 hours.  Recent Results (from the past 240 hour(s))  SARS CORONAVIRUS 2 (TAT 6-24 HRS) Nasopharyngeal Nasopharyngeal Swab     Status: None   Collection Time: 08/02/19  6:41 PM   Specimen: Nasopharyngeal Swab  Result Value Ref Range Status   SARS Coronavirus 2 NEGATIVE NEGATIVE Final    Comment: (NOTE) SARS-CoV-2 target nucleic acids are NOT DETECTED. The SARS-CoV-2 RNA is generally detectable in upper and lower respiratory specimens during the acute phase of infection. Negative results do not preclude SARS-CoV-2 infection, do not rule out co-infections with other pathogens, and should not be used as the sole basis for treatment or other patient management decisions. Negative results must be combined with clinical observations, patient history, and epidemiological information. The expected result is Negative. Fact Sheet for Patients: SugarRoll.be Fact Sheet for Healthcare Providers: https://www.woods-mathews.com/ This test is not yet approved or cleared by the [place] FDA and  has been authorized for detection and/or diagnosis of SARS-CoV-2 by FDA under an Emergency Use Authorization (EUA). This EUA will remain  in effect (meaning this test can be used) for the duration of the COVID-19 declaration under Section 56 4(b)(1) of the Act, 21 U.S.C. section 360bbb-3(b)(1), unless the authorization is terminated or revoked sooner. Performed at [facility], [place] [address], [place], [place] [address]          Radiology Studies: No results found.      Scheduled Meds: . sodium chloride   Intravenous Once  . busPIRone  15 mg Oral Daily  . furosemide  40 mg Intravenous BID  . insulin aspart  0-9 Units Subcutaneous Q4H  . [START ON 08/06/2019] levothyroxine  50 mcg Oral Q0600  . lisinopril  20 mg Oral Daily  . metoprolol succinate  100 mg Oral Daily   Continuous Infusions:   LOS: 3 days    Time spent: 36 minutes spent on chart review, discussion with nursing staff, consultants, updating family and interview/physical exam; more than 50% of that time was spent in counseling and/or coordination of care.     J [place] DO [facility] 08/05/2019, 7:00 PM

## 2019-08-05 NOTE — Op Note (Signed)
[facility] Patient Name: Sue [name] Procedure Date: 08/05/2019 MRN: [id number] Attending MD: [name] , MD Date of Birth: August 04, 1951 CSN: [id number] Age: 68 Admit Type: Inpatient Procedure:                Colonoscopy Indications:              Evaluation of unexplained GI bleeding presenting                            with Hematochezia, This is the patient's first                            colonoscopy, Rectal bleeding Providers:                [name], MD, Cleda Daub, RN, Lazaro Arms, Technician Referring MD:             hospital team Medicines:                Propofol per Anesthesia, Monitored Anesthesia Care Complications:            No immediate complications. Estimated Blood Loss:     see note Procedure:                Pre-Anesthesia Assessment:                           - Prior to the procedure, a History and Physical                            was performed, and patient medications and                            allergies were reviewed. The patient's tolerance of                            previous anesthesia was also reviewed. The risks                            and benefits of the procedure and the sedation                            options and risks were discussed with the patient.                            All questions were answered, and informed consent                            was obtained. Prior Anticoagulants: The patient has                            taken Coumadin (warfarin), last dose was stopped at  admission. ASA Grade Assessment: IV - A patient                            with severe systemic disease that is a constant                            threat to life. After reviewing the risks and                            benefits, the patient was deemed in satisfactory                            condition to undergo the procedure.                           After  obtaining informed consent, the colonoscope                            was passed under direct vision. Throughout the                            procedure, the patient's blood pressure, pulse, and                            oxygen saturations were monitored continuously. The                            [id number]) Olympus pediatric colonscope                            was introduced through the anus and advanced to the                            the cecum, identified by appendiceal orifice and                            ileocecal valve. The colonoscopy was extremely                            difficult due to excessive bleeding, unsatisfactory                            bowel prep and a tortuous colon. Successful                            completion of the procedure was aided by                            straightening and shortening the scope to obtain                            bowel loop reduction, lavage and performing the  maneuvers documented (below) in this report. The                            patient tolerated the procedure well. The quality                            of the bowel preparation was unsatisfactory and not                            adequate to identify polyps 6 mm and larger in                            size. The terminal ileum, the appendiceal orifice                            and the rectum were photographed. Scope In: 9:41:58 AM Scope Out: 10:25:58 AM Scope Withdrawal Time: 0 hours 35 minutes 28 seconds  Total Procedure Duration: 0 hours 44 minutes 0 seconds  Findings:      The perianal and digital rectal examinations were normal.      Red blood was found in the entire colon.      Scattered small-mouthed diverticula were found in the sigmoid colon.      Internal hemorrhoids were found during endoscopy. The hemorrhoids were       small and Grade I (internal hemorrhoids that do not prolapse).      Multiple maroon-colored  clots in the left colon prevented adequate       visualization of the colonic mucosa.      Despite repeated irrigation and aspiration could not pinpoint a bleeding       site.      The terminal ileum appeared normal. Impression:               - Preparation of the colon was unsatisfactory.                           - Preparation of the colon was inadequate.                           - Blood in the entire examined colon.                           - Diverticulosis in the sigmoid colon.                           - Internal hemorrhoids.                           - The examined portion of the ileum was normal.                           - No specimens collected. Moderate Sedation:      Not Applicable - Patient had care per Anesthesia. Recommendation:           - Clear liquid diet.                           -  Miralax to flush the remaining blood and clots                            from the colonoscopy. If rebleeds then will need an                            angiogram with possible emoblization. Continue to                            hold anticoagulation.                           - Repeat colonoscopy as an outpt because the bowel                            preparation was poor. Procedure Code(s):        --- Professional ---                           [phone], Colonoscopy, flexible; diagnostic, including                            collection of specimen(s) by brushing or washing,                            when performed (separate procedure) Diagnosis Code(s):        --- Professional ---                           K92.1, Melena (includes Hematochezia)                           K62.5, Hemorrhage of anus and rectum                           K92.2, Gastrointestinal hemorrhage, unspecified                           K64.0, First degree hemorrhoids                           K57.30, Diverticulosis of large intestine without                            perforation or abscess without bleeding CPT  copyright 2019 American Medical Association. All rights reserved. The codes documented in this report are preliminary and upon coder review may  be revised to meet current compliance requirements. [name], MD 08/05/2019 10:46:56 AM This report has been signed electronically. Number of Addenda: 0

## 2019-08-05 NOTE — Anesthesia Postprocedure Evaluation (Signed)
Anesthesia Post Note  Patient: Sue [name]  Procedure(s) Performed: COLONOSCOPY WITH PROPOFOL (N/A )     Patient location during evaluation: PACU Anesthesia Type: General Level of consciousness: awake and alert Pain management: pain level controlled Vital Signs Assessment: post-procedure vital signs reviewed and stable Respiratory status: spontaneous breathing, nonlabored ventilation, respiratory function stable and patient connected to nasal cannula oxygen Cardiovascular status: blood pressure returned to baseline and stable Postop Assessment: no apparent nausea or vomiting Anesthetic complications: no    Last Vitals:  Vitals:   08/05/19 1105 08/05/19 1332  BP: 136/70 122/76  Pulse: 99 (!) 107  Resp: 20 18  Temp: 36.6 C 36.9 C  SpO2: 100% 100%    Last Pain:  Vitals:   08/05/19 1332  TempSrc: Oral  PainSc:                  Cristine Daw

## 2019-08-05 NOTE — Transfer of Care (Signed)
Immediate Anesthesia Transfer of Care Note  Patient: MAELIN KURKOWSKI  Procedure(s) Performed: Procedure(s): COLONOSCOPY WITH PROPOFOL (N/A)  Patient Location: Endoscopy Unit  Anesthesia Type:MAC  Level of Consciousness:  sedated, patient cooperative and responds to stimulation  Airway & Oxygen Therapy:Patient Spontanous Breathing and Patient connected to face mask oxgen  Post-op Assessment:  Report given to PACU RN and Post -op Vital signs reviewed and stable  Post vital signs:  Reviewed and stable  Last Vitals:  Vitals:   08/05/19 0805 08/05/19 0835  BP: (!) 149/104 (!) 173/71  Pulse: (!) 118 (!) 109  Resp:    Temp:  36.9 C  SpO2:  94%    Complications: No apparent anesthesia complications

## 2019-08-05 NOTE — TOC Initial Note (Signed)
Transition of Care [facility]) - Initial/Assessment Note    Patient Details  Name: Sue [name] MRN: [id number] Date of Birth: 13-Mar-1951  [facility] CM/SW Contact:    Lynnell Catalan, RN Phone Number: 08/05/2019, 3:56 PM  Clinical Narrative:                   Expected Discharge Plan: Home/Self Care Barriers to Discharge: Continued Medical Work up   Patient Goals and CMS Choice Patient states their goals for this hospitalization and ongoing recovery are:: to get better      Expected Discharge Plan and Services Expected Discharge Plan: Home/Self Care       Living arrangements for the past 2 months: Apartment(senior living complex)                                      Prior Living Arrangements/Services Living arrangements for the past 2 months: Apartment(senior living complex) Lives with:: Self Patient language and need for interpreter reviewed:: Yes        Need for Family Participation in Patient Care: Yes (Comment) Care giver support system in place?: Yes (comment)   Criminal Activity/Legal Involvement Pertinent to Current Situation/Hospitalization: No - Comment as needed  Activities of Daily Living Home Assistive Devices/Equipment: Blood pressure cuff, Dentures (specify type), Eyeglasses, Grab bars around toilet, Grab bars in shower, Oxygen, Shower chair with back, Walker (specify type)(Partial top dentures) ADL Screening (condition at time of admission) Patient's cognitive ability adequate to safely complete daily activities?: Yes Is the patient deaf or have difficulty hearing?: No Does the patient have difficulty seeing, even when wearing glasses/contacts?: Yes Does the patient have difficulty concentrating, remembering, or making decisions?: No Patient able to express need for assistance with ADLs?: Yes Does the patient have difficulty dressing or bathing?: Yes Independently performs ADLs?: No Communication: Needs assistance Is this a  change from baseline?: Pre-admission baseline Dressing (OT): Needs assistance Is this a change from baseline?: Pre-admission baseline Grooming: Needs assistance Is this a change from baseline?: Pre-admission baseline Bathing: Needs assistance Is this a change from baseline?: Pre-admission baseline Toileting: Needs assistance Is this a change from baseline?: Pre-admission baseline In/Out Bed: Needs assistance Is this a change from baseline?: Pre-admission baseline Walks in Home: Needs assistance Is this a change from baseline?: Pre-admission baseline Does the patient have difficulty walking or climbing stairs?: Yes Weakness of Legs: Both Weakness of Arms/Hands: None  Permission Sought/Granted                  Emotional Assessment Appearance:: Appears stated age     Orientation: : Oriented to Self, Oriented to Place, Oriented to  Time, Oriented to Situation   Psych Involvement: No (comment)  Admission diagnosis:  Acute blood loss anemia [D62] Prolonged Q-T interval on ECG [R94.31] Gastrointestinal hemorrhage, unspecified gastrointestinal hemorrhage type [K92.2] Patient Active Problem List   Diagnosis Date Noted  . Symptomatic anemia 08/03/2019  . QT prolongation 08/03/2019  . GI bleed 08/02/2019  . Diabetes mellitus type II, uncontrolled ([place]) 05/29/2019  . Hyperlipidemia associated with type 2 diabetes mellitus ([place]) 05/29/2019  . DVT (deep venous thrombosis) ([place]) 03/17/2019  . Acute on chronic renal insufficiency 03/17/2019  . Macrocytic anemia 03/17/2019  . Small bowel obstruction ([place]) 03/08/2019  . Fracture of left tibia 02/25/2019  . Left tibial fracture 02/25/2019  . Hyperglycemia 08/16/2018  . Morbid obesity due to excess calories (  [place]) 05/09/2018  . Acute bronchitis with COPD ([place]) 10/19/2017  . Atypical chest pain 05/01/2017  . Pulmonary emphysema ([place]) 04/06/2017  . Chronic seasonal allergic rhinitis 04/06/2017  . GERD (gastroesophageal reflux disease)  04/06/2017  . Snoring 04/06/2017  . Myalgia 02/01/2017  . Arthralgia 02/01/2017  . Chronic pain of toe of left foot 11/14/2016  . Chronic hepatitis C without hepatic coma ([place]) 08/01/2016  . Hypothyroidism 10/29/2015  . Mild diastolic dysfunction 0000000  . Edema 01/01/2015  . Edema of both ankles 12/24/2014  . TIA (transient ischemic attack) 07/30/2014  . Weakness 07/29/2014  . Flank pain 11/12/2013  . Chronic respiratory failure ([place]) 10/25/2013  . DOE (dyspnea on exertion) 10/25/2013  . Depression with anxiety 10/15/2013  . Insomnia 10/15/2013  . HTN (hypertension) 10/09/2013  . Hypokalemia 10/09/2013   PCP:  Ann Held, DO Pharmacy:   [facility], [place] [address] Phone: [phone] Fax: [phone]     Social Determinants of Health (SDOH) Interventions    Readmission Risk Interventions Readmission Risk Prevention Plan 08/04/2019  Transportation Screening Complete  Medication Review ([place]) Complete  HRI or [place] Complete  SW Recovery Care/Counseling Consult Complete  [place] Not Applicable  Some recent data might be hidden

## 2019-08-05 NOTE — Anesthesia Preprocedure Evaluation (Addendum)
Anesthesia Evaluation  Patient identified by MRN, date of birth, ID band Patient awake    Reviewed: Allergy & Precautions, H&P , NPO status , Patient's Chart, lab work & pertinent test results, reviewed documented beta blocker date and time   Airway Mallampati: II  TM Distance: >3 FB Neck ROM: full    Dental no notable dental hx. (+) Partial Upper, Partial Lower, Missing   Pulmonary shortness of breath, with exertion and Long-Term Oxygen Therapy, COPD, former smoker,    Pulmonary exam normal breath sounds clear to auscultation       Cardiovascular Exercise Tolerance: Good hypertension, Pt. on medications + DOE  (-) dysrhythmias  Rhythm:regular Rate:Normal     Neuro/Psych PSYCHIATRIC DISORDERS Anxiety Depression TIACVA    GI/Hepatic GERD  Medicated,(+) Hepatitis -  Endo/Other  diabetesHypothyroidism   Renal/GU Renal disease  negative genitourinary   Musculoskeletal   Abdominal   Peds  Hematology  (+) Blood dyscrasia, anemia ,   Anesthesia Other Findings   Reproductive/Obstetrics negative OB ROS                            Anesthesia Physical Anesthesia Plan  ASA: IV  Anesthesia Plan: General   Post-op Pain Management:    Induction:   PONV Risk Score and Plan:   Airway Management Planned:   Additional Equipment:   Intra-op Plan:   Post-operative Plan:   Informed Consent: I have reviewed the patients History and Physical, chart, labs and discussed the procedure including the risks, benefits and alternatives for the proposed anesthesia with the patient or authorized representative who has indicated his/her understanding and acceptance.     Dental Advisory Given  Plan Discussed with: CRNA  Anesthesia Plan Comments:         Anesthesia Quick Evaluation

## 2019-08-06 LAB — CBC
HCT: 27.6 % — ABNORMAL LOW (ref 36.0–46.0)
Hemoglobin: 8.6 g/dL — ABNORMAL LOW (ref 12.0–15.0)
MCH: 29.6 pg (ref 26.0–34.0)
MCHC: 31.2 g/dL (ref 30.0–36.0)
MCV: 94.8 fL (ref 80.0–100.0)
Platelets: 170 10*3/uL (ref 150–400)
RBC: 2.91 MIL/uL — ABNORMAL LOW (ref 3.87–5.11)
RDW: 14.9 % (ref 11.5–15.5)
WBC: 8 10*3/uL (ref 4.0–10.5)
nRBC: 0.4 % — ABNORMAL HIGH (ref 0.0–0.2)

## 2019-08-06 LAB — BASIC METABOLIC PANEL
Anion gap: 8 (ref 5–15)
BUN: 9 mg/dL (ref 8–23)
CO2: 27 mmol/L (ref 22–32)
Calcium: 7.3 mg/dL — ABNORMAL LOW (ref 8.9–10.3)
Chloride: 105 mmol/L (ref 98–111)
Creatinine, Ser: 1.44 mg/dL — ABNORMAL HIGH (ref 0.44–1.00)
GFR calc Af Amer: 43 mL/min — ABNORMAL LOW (ref >60)
GFR calc non Af Amer: 37 mL/min — ABNORMAL LOW (ref >60)
Glucose, Bld: 111 mg/dL — ABNORMAL HIGH (ref 70–99)
Potassium: 3.5 mmol/L (ref 3.5–5.1)
Sodium: 140 mmol/L (ref 135–145)

## 2019-08-06 LAB — HEMOGLOBIN AND HEMATOCRIT, BLOOD
HCT: 27.1 % — ABNORMAL LOW (ref 36.0–46.0)
HCT: 28.4 % — ABNORMAL LOW (ref 36.0–46.0)
Hemoglobin: 8.6 g/dL — ABNORMAL LOW (ref 12.0–15.0)
Hemoglobin: 8.8 g/dL — ABNORMAL LOW (ref 12.0–15.0)

## 2019-08-06 LAB — GLUCOSE, CAPILLARY
Glucose-Capillary: 124 mg/dL — ABNORMAL HIGH (ref 70–99)
Glucose-Capillary: 98 mg/dL (ref 70–99)
Glucose-Capillary: 99 mg/dL (ref 70–99)
Glucose-Capillary: 123 mg/dL — ABNORMAL HIGH (ref 70–99)
Glucose-Capillary: 119 mg/dL — ABNORMAL HIGH (ref 70–99)
Glucose-Capillary: 130 mg/dL — ABNORMAL HIGH (ref 70–99)

## 2019-08-06 LAB — PREPARE RBC (CROSSMATCH)

## 2019-08-06 LAB — MAGNESIUM: Magnesium: 1.9 mg/dL (ref 1.7–2.4)

## 2019-08-06 LAB — PROTIME-INR
INR: 1.6 — ABNORMAL HIGH (ref 0.8–1.2)
Prothrombin Time: 18.6 seconds — ABNORMAL HIGH (ref 11.4–15.2)

## 2019-08-06 NOTE — Progress Notes (Signed)
[facility] Progress Note  Sue [name] 68 y.o. Jun 07, 1951   Subjective: Bloody stools overnight but denies any BMs or rectal bleeding today. Denies abdominal pain.  Objective: Vital signs: Vitals:   08/06/19 0452 08/06/19 1028  BP: (!) 97/55 115/61  Pulse: 80 76  Resp: 20   Temp: 98.7 F (37.1 C)   SpO2: 100%     Physical Exam: Gen: lethargic, no acute distress, obese, pleasant HEENT: anicteric sclera CV: RRR Chest: CTA B Abd: soft, nontender, nondistended, +BS, obese Ext: no edema  Lab Results: Recent Labs    08/05/19 0459 08/06/19 0546  NA 136 140  K 3.4* 3.5  CL 103 105  CO2 24 27  GLUCOSE 148* 111*  BUN 10 9  CREATININE 1.25* 1.44*  CALCIUM 7.3* 7.3*  MG 2.2 1.9   No results for input(s): AST, ALT, ALKPHOS, BILITOT, PROT, ALBUMIN in the last 72 hours. Recent Labs    08/05/19 0459  08/06/19 0546 08/06/19 1155  WBC 9.6  --  8.0  --   HGB 8.0*   < > 8.6* 8.6*  HCT 26.7*   < > 27.6* 27.1*  MCV 94.7  --  94.8  --   PLT 215  --  170  --    < > = values in this interval not displayed.      Assessment/Plan: Lower GI bleed likely diverticular - rectal bleeding overnight I think mainly due to clots that were seen during her colonoscopy although Hgb did drop to 6.6. IR was consulted for angiogram and possible embolization and they recommended a repeat bleeding scan. She is NOT showing signs of ongoing bleeding so I cancelled bleeding scan that will likely be unrevealing. Hgb 8.6 after transfusion. If she shows signs of recurrent active bleeding then consider bleeding scan and/or embolization at that time. Clear liquid diet started. Continue supportive care. Will follow. D/W Dr. [place]   Sue [name] 08/06/2019, 12:16 PM  Questions please call [phone] ID: Sue [name], female   DOB: 06-08-1951, 68 y.o.   MRN: [id number]

## 2019-08-06 NOTE — Plan of Care (Signed)
  Problem: Education: Goal: Knowledge of General Education information will improve Description: Including pain rating scale, medication(s)/side effects and non-pharmacologic comfort measures Outcome: Progressing   Problem: Clinical Measurements: Goal: Will remain free from infection Outcome: Progressing Goal: Respiratory complications will improve Outcome: Progressing Goal: Cardiovascular complication will be avoided Outcome: Progressing   Problem: Activity: Goal: Risk for activity intolerance will decrease Outcome: Progressing   Problem: Coping: Goal: Level of anxiety will decrease Outcome: Progressing   Problem: Pain Managment: Goal: General experience of comfort will improve Outcome: Progressing   Problem: Safety: Goal: Ability to remain free from injury will improve Outcome: Progressing   Problem: Skin Integrity: Goal: Risk for impaired skin integrity will decrease Outcome: Progressing   

## 2019-08-06 NOTE — Care Management Important Message (Signed)
Important Message  Patient Details IM Letter given to Rhea Pink SW to present to the Patient   Name: Sue [name] MRN: [id number] Date of Birth: 1951/03/25   Medicare Important Message Given:  Yes     Kerin Salen 08/06/2019, 11:00 AM

## 2019-08-06 NOTE — Progress Notes (Signed)
PROGRESS NOTE    Sue [name]  [id number] DOB: [birth year] DOA: 08/02/2019 PCP: Ann Held, DO    Brief Narrative:   Sue [name] is a 68 year old female with history of recent DVT on Coumadin, chronic hypoxic respiratory failure on 3 L oxygen at baseline, insulin-dependent diabetes mellitus type 2, presented with 2-day history of bright red blood per rectum.  Recently started on Coumadin in June 2020.  Hemoglobin was noted to be 4.6 on admission with an INR of 2.4.  EDP consulted gastroenterology and requested TRH for admission for likely lower GI bleed.   Assessment & Plan:   Principal Problem:   GI bleed Active Problems:   DVT (deep venous thrombosis) (HCC)   Diabetes mellitus type II, uncontrolled (HCC)   Symptomatic anemia   QT prolongation   Lower GI bleed, suspect diverticular Acute blood loss anemia Patient presenting with 2-day history of bright red blood per rectum, complicated by recent diagnosis of DVT currently on Coumadin.  Hemoglobin 4.6 on admission with an INR of 2.4.  Transfused 3 units PRBCs, 1 unit FFP, and received IV vitamin K.  Nuclear med bleeding scan notable for right lower abdomen concerning for active GI bleed correlating to the sigmoid versus small bowel. --Gastroenterology following, appreciate assistance --Colonoscopy 08/05/2019 with findings of red blood found in the entire colon with scattered small mouth diverticula, internal hemorrhoids, multiple maroon-colored clots left colon with poor visualization and despite repeated irrigation and aspiration cannot pinpoint active bleeding site. --Hgb drop from 8.0 to 6.4 following colonoscopy on 11/23; transfused 2u additional units pRBC (now 5 since admission) --Hgb 8.0-->6.4-->6.6-->8.6 --IR consulted for evaluation with possible angiogram and embolization and recommended repeat NM GI Bleed study --Repeat NM Bleeding scan pending --Continue to monitor H&H every 6 hours and transfuse for  hemoglobin < 7.0  Type 2 diabetes mellitus, insulin-dependent Hemoglobin A1c, 6.5, well controlled.  Glipizide 5 mg p.o. twice daily, Metformin 1000 mg p.o. twice daily, units subcutaneously daily. --Holding home medications at this time --Continue insulin sliding scale for coverage  Left lower extremity DVT, June 2020, provoked Secondary to fracture and poor mobility.  INR 2.4 on admission, within normal range.  Was reversed with IV vitamin K and FFP secondary to GI bleed as above. --Continue to hold Coumadin given acute GI bleed as above  Emphysema and chronic hypoxic respiratory failure Uses 3 L supplemental oxygen at home 24 hours. Increased to 4 L in the ED. Has worsening dyspnea due to acute blood loss anemia. Lungs clear on auscultation. Chest x-ray negative. --Currently on 3 L nasal cannula, oxygenating 100% --Continue supplemental oxygen, titrate for SPO2 greater than 88%  Insulin-dependent diabetes mellitus Hemoglobin A1c 6.5.  --Continue sliding scale insulin --CBG checks every 4 hours as patient is n.p.o.  Prolonged QTC QTC noted to be 526.  Holding home Seroquel for now.  Depression/anxiety --Continue buspirone 15 mg p.o. daily, Ativan prn --Holding home Seroquel secondary to prolonged QTC  Pulmonary nodule CT showing partially visualized 5 mm right lower lobe pulmonary nodule.Patient is a former smoker with a 40-pack-year smoking history. Will need repeat noncontrast chest CT in 12 months for follow-up  Morbid obesity: BMI 52.60, discussed with patient need for aggressive lifestyle changes and weight loss as this complicates all facets of care.   DVT prophylaxis: SCDs, chemical DVT prophylaxis contraindicated in acute GI bleed Code Status: Full code Family Communication:  Disposition Plan: Remain inpatient, further depending on clinical course   Consultants:   [facility]  Interventional radiology  Procedures:  Colonoscopy 08/05/2019:  Impression:                - Preparation of the colon was unsatisfactory. - Preparation of the colon was inadequate. - Blood in the entire examined colon. - Diverticulosis in the sigmoid colon. - Internal hemorrhoids. - The examined portion of the ileum was normal. - No specimens collected.  Recommendation:            - Clear liquid diet. - Miralax to flush the remaining blood and clots from the colonoscopy. If rebleeds then will need an  angiogram with possible emoblization. Continue to hold anticoagulation. - Repeat colonoscopy as an outpt because the bowel preparation was poor.   Antimicrobials:   None   Subjective: Patient seen and examined bedside, resting comfortably.  Yesterday evening, hemoglobin noted to drop to 6.4 and received 2 additional units PRBCs overnight with appropriate rise to 8.6 this morning.  Continues with several bloody bowel movements overnight.  Consulted IR with recommendations of repeat nuclear med GI bleeding scan prior to consideration of angiography with embolization.  Patient states dizziness, fatigue, weakness has improved transfusion overnight.  No other complaints this morning. Currently denies headache, no dizziness, no chest pain, palpitations, no nausea vomiting, no abdominal pain, no weakness, no fatigue, no paresthesias.  No acute events overnight per nursing staff.  Objective: Vitals:   08/06/19 0217 08/06/19 0246 08/06/19 0452 08/06/19 1028  BP: 101/64 96/61 (!) 97/55 115/61  Pulse: 74 78 80 76  Resp: 18 20 20    Temp: 97.7 F (36.5 C) 97.8 F (36.6 C) 98.7 F (37.1 C)   TempSrc: Oral Oral Oral   SpO2: 100% 100% 100%   Weight:      Height:        Intake/Output Summary (Last 24 hours) at 08/06/2019 1202 Last data filed at 08/06/2019 1000 Gross per 24 hour  Intake 1392 ml  Output -  Net 1392 ml   Filed Weights   08/02/19 1656 08/05/19 0835  Weight: 134.7 kg 134.7 kg    Examination:  General exam: Appears calm and  comfortable, obese Respiratory system: Clear to auscultation. Respiratory effort normal. Cardiovascular system: S1 & S2 heard, RRR. No JVD, murmurs, rubs, gallops or clicks. No pedal edema. Gastrointestinal system: Abdomen is nondistended, soft and nontender. No organomegaly or masses felt. Normal bowel sounds heard. Central nervous system: Alert and oriented. No focal neurological deficits. Extremities: Symmetric 5 x 5 power. Skin: No rashes, lesions or ulcers Psychiatry: Judgement and insight appear normal. Mood & affect appropriate.     Data Reviewed: I have personally reviewed following labs and imaging studies  CBC: Recent Labs  Lab 08/03/19 1647 08/04/19 0119 08/04/19 0507 08/04/19 1640 08/05/19 0459 08/05/19 1706 08/05/19 2213 08/06/19 0546  WBC 8.6 8.7 8.8  --  9.6  --   --  8.0  HGB 8.5* 8.3* 7.9* 7.6* 8.0* 6.4* 6.6* 8.6*  HCT 27.3* 26.9* 25.5* 24.8* 26.7* 21.1* 21.8* 27.6*  MCV 91.6 92.4 92.1  --  94.7  --   --  94.8  PLT 192 182 185  --  215  --   --  [id number]   Basic Metabolic Panel: Recent Labs  Lab 08/02/19 1837 08/03/19 1647 08/04/19 0119 08/04/19 0507 08/05/19 0459 08/06/19 0546  NA 140 139 140 138 136 140  K 3.4* 3.4* 3.0* 3.1* 3.4* 3.5  CL 101 100 102 102 103 105  CO2 30 29 29 29 24 27   GLUCOSE  186* 239* 183* 159* 148* 111*  BUN 33* 24* 21 20 10 9   CREATININE 1.59* 1.45* 1.39* 1.29* 1.25* 1.44*  CALCIUM 7.8* 7.5* 7.5* 7.4* 7.3* 7.3*  MG 1.2*  --  2.4 3.0* 2.2 1.9   GFR: Estimated Creatinine Clearance: 51.1 mL/min (A) (by C-G formula based on SCr of 1.44 mg/dL (H)). Liver Function Tests: Recent Labs  Lab 08/02/19 1837  AST 21  ALT 12  ALKPHOS 127*  BILITOT 0.6  PROT 7.4  ALBUMIN 3.1*   No results for input(s): LIPASE, AMYLASE in the last 168 hours. No results for input(s): AMMONIA in the last 168 hours. Coagulation Profile: Recent Labs  Lab 08/02/19 1837 08/03/19 1647 08/05/19 0459 08/06/19 0546  INR 2.4* 1.8* 1.5* 1.6*   Cardiac  Enzymes: No results for input(s): CKTOTAL, CKMB, CKMBINDEX, TROPONINI in the last 168 hours. BNP (last 3 results) No results for input(s): PROBNP in the last 8760 hours. HbA1C: Recent Labs    08/04/19 0507  HGBA1C 6.5*   CBG: Recent Labs  Lab 08/05/19 1936 08/06/19 0105 08/06/19 0421 08/06/19 0746 08/06/19 1142  GLUCAP 128* 124* 98 99 123*   Lipid Profile: No results for input(s): CHOL, HDL, LDLCALC, TRIG, CHOLHDL, LDLDIRECT in the last 72 hours. Thyroid Function Tests: No results for input(s): TSH, T4TOTAL, FREET4, T3FREE, THYROIDAB in the last 72 hours. Anemia Panel: No results for input(s): VITAMINB12, FOLATE, FERRITIN, TIBC, IRON, RETICCTPCT in the last 72 hours. Sepsis Labs: No results for input(s): PROCALCITON, LATICACIDVEN in the last 168 hours.  Recent Results (from the past 240 hour(s))  SARS CORONAVIRUS 2 (TAT 6-24 HRS) Nasopharyngeal Nasopharyngeal Swab     Status: None   Collection Time: 08/02/19  6:41 PM   Specimen: Nasopharyngeal Swab  Result Value Ref Range Status   SARS Coronavirus 2 NEGATIVE NEGATIVE Final    Comment: (NOTE) SARS-CoV-2 target nucleic acids are NOT DETECTED. The SARS-CoV-2 RNA is generally detectable in upper and lower respiratory specimens during the acute phase of infection. Negative results do not preclude SARS-CoV-2 infection, do not rule out co-infections with other pathogens, and should not be used as the sole basis for treatment or other patient management decisions. Negative results must be combined with clinical observations, patient history, and epidemiological information. The expected result is Negative. Fact Sheet for Patients: SugarRoll.be Fact Sheet for Healthcare Providers: https://www.woods-mathews.com/ This test is not yet approved or cleared by the [place] FDA and  has been authorized for detection and/or diagnosis of SARS-CoV-2 by FDA under an Emergency Use  Authorization (EUA). This EUA will remain  in effect (meaning this test can be used) for the duration of the COVID-19 declaration under Section 56 4(b)(1) of the Act, 21 U.S.C. section 360bbb-3(b)(1), unless the authorization is terminated or revoked sooner. Performed at [facility], [place] [address], [place], [place] [address]          Radiology Studies: No results found.      Scheduled Meds: . sodium chloride   Intravenous Once  . busPIRone  15 mg Oral Daily  . furosemide  40 mg Intravenous BID  . insulin aspart  0-9 Units Subcutaneous Q4H  . levothyroxine  50 mcg Oral Q0600  . lisinopril  20 mg Oral Daily  . metoprolol succinate  100 mg Oral Daily  . polyethylene glycol  17 g Oral TID   Continuous Infusions:   LOS: 4 days    Time spent: 36 minutes spent on chart review, discussion with nursing staff, consultants,  updating family and interview/physical exam; more than 50% of that time was spent in counseling and/or coordination of care.    Xzaiver Vayda J [place] DO [facility] 08/06/2019, 12:02 PM

## 2019-08-06 NOTE — Progress Notes (Signed)
Patient ID: Sue [name], female   DOB: 1951/03/27, 68 y.o.   MRN: [id number] Asked by primary care team to evaluate patient for possible visceral arteriogram/embolization.  Case has been reviewed by Dr. Kathlene Cote.  If patient is actively bleeding recommend repeat nuclear medicine GI bleeding scan prior to arteriogram.  Today's hemoglobin 8.6, creatinine 1.44.  If bleeding has stopped no further endovascular intervention needed at this time.  Please contact Dr. Kathlene Cote at [phone] with any additional questions.

## 2019-08-06 NOTE — Addendum Note (Signed)
Addendum  created 08/06/19 1651 by Janeece Riggers, MD   Clinical Note Signed

## 2019-08-06 NOTE — Anesthesia Postprocedure Evaluation (Signed)
Anesthesia Post Note  Patient: Sue [name]  Procedure(s) Performed: COLONOSCOPY WITH PROPOFOL (N/A )     Patient location during evaluation: PACU Anesthesia Type: MAC Level of consciousness: awake and alert Pain management: pain level controlled Vital Signs Assessment: post-procedure vital signs reviewed and stable Respiratory status: spontaneous breathing, nonlabored ventilation, respiratory function stable and patient connected to nasal cannula oxygen Cardiovascular status: stable and blood pressure returned to baseline Postop Assessment: no apparent nausea or vomiting Anesthetic complications: no    Last Vitals:  Vitals:   08/06/19 1028 08/06/19 1413  BP: 115/61 107/65  Pulse: 76 81  Resp:  20  Temp:  37.2 C  SpO2:  95%    Last Pain:  Vitals:   08/06/19 1413  TempSrc: Oral  PainSc:                  Orla Estrin

## 2019-08-07 ENCOUNTER — Encounter (HOSPITAL_COMMUNITY): Payer: Self-pay | Admitting: Gastroenterology

## 2019-08-07 LAB — HEMOGLOBIN AND HEMATOCRIT, BLOOD
HCT: 27.5 % — ABNORMAL LOW (ref 36.0–46.0)
HCT: 28.3 % — ABNORMAL LOW (ref 36.0–46.0)
HCT: 29.4 % — ABNORMAL LOW (ref 36.0–46.0)
HCT: 30.3 % — ABNORMAL LOW (ref 36.0–46.0)
Hemoglobin: 8.5 g/dL — ABNORMAL LOW (ref 12.0–15.0)
Hemoglobin: 8.7 g/dL — ABNORMAL LOW (ref 12.0–15.0)
Hemoglobin: 9.1 g/dL — ABNORMAL LOW (ref 12.0–15.0)
Hemoglobin: 9.4 g/dL — ABNORMAL LOW (ref 12.0–15.0)

## 2019-08-07 LAB — BASIC METABOLIC PANEL
Anion gap: 7 (ref 5–15)
BUN: 10 mg/dL (ref 8–23)
CO2: 27 mmol/L (ref 22–32)
Calcium: 7.5 mg/dL — ABNORMAL LOW (ref 8.9–10.3)
Chloride: 102 mmol/L (ref 98–111)
Creatinine, Ser: 1.7 mg/dL — ABNORMAL HIGH (ref 0.44–1.00)
GFR calc Af Amer: 36 mL/min — ABNORMAL LOW (ref >60)
GFR calc non Af Amer: 31 mL/min — ABNORMAL LOW (ref >60)
Glucose, Bld: 115 mg/dL — ABNORMAL HIGH (ref 70–99)
Potassium: 3.7 mmol/L (ref 3.5–5.1)
Sodium: 136 mmol/L (ref 135–145)

## 2019-08-07 LAB — MAGNESIUM: Magnesium: 1.9 mg/dL (ref 1.7–2.4)

## 2019-08-07 LAB — CBC
HCT: 29.9 % — ABNORMAL LOW (ref 36.0–46.0)
Hemoglobin: 9.2 g/dL — ABNORMAL LOW (ref 12.0–15.0)
MCH: 29.6 pg (ref 26.0–34.0)
MCHC: 30.8 g/dL (ref 30.0–36.0)
MCV: 96.1 fL (ref 80.0–100.0)
Platelets: 176 10*3/uL (ref 150–400)
RBC: 3.11 MIL/uL — ABNORMAL LOW (ref 3.87–5.11)
RDW: 15.3 % (ref 11.5–15.5)
WBC: 9.2 10*3/uL (ref 4.0–10.5)
nRBC: 0.3 % — ABNORMAL HIGH (ref 0.0–0.2)

## 2019-08-07 LAB — GLUCOSE, CAPILLARY
Glucose-Capillary: 112 mg/dL — ABNORMAL HIGH (ref 70–99)
Glucose-Capillary: 118 mg/dL — ABNORMAL HIGH (ref 70–99)
Glucose-Capillary: 120 mg/dL — ABNORMAL HIGH (ref 70–99)
Glucose-Capillary: 161 mg/dL — ABNORMAL HIGH (ref 70–99)
Glucose-Capillary: 187 mg/dL — ABNORMAL HIGH (ref 70–99)
Glucose-Capillary: 89 mg/dL (ref 70–99)

## 2019-08-07 LAB — PROTIME-INR
INR: 1.4 — ABNORMAL HIGH (ref 0.8–1.2)
Prothrombin Time: 17 seconds — ABNORMAL HIGH (ref 11.4–15.2)

## 2019-08-07 MED ORDER — SODIUM CHLORIDE 0.9 % IV SOLN
INTRAVENOUS | Status: AC
  Administered 2019-08-07 – 2019-08-08 (×3): via INTRAVENOUS

## 2019-08-07 NOTE — Progress Notes (Signed)
Subjective: States she has not moved her bowels since 730 yesterday morning.  She has been on clear liquid diet.  Denies abdominal pain, nausea or vomiting.  Objective: Vital signs in last 24 hours: Temp:  [97.7 F (36.5 C)-100 F (37.8 C)] 100 F (37.8 C) (11/25 0502) Pulse Rate:  [76-83] 83 (11/25 0502) Resp:  [20] 20 (11/25 0502) BP: (86-135)/(51-76) 135/76 (11/25 0502) SpO2:  [95 %-100 %] 100 % (11/25 0502) Weight change:  Last BM Date: 08/05/19  PE: Morbidly obese, on oxygen via nasal cannula 2-1/2 L/min GENERAL: Not in distress, able to speak in full sentences, mild pallor ABDOMEN: Soft, nontender, sluggish bowel sounds EXTREMITIES: Obese extremities  Lab Results: Results for orders placed or performed during the hospital encounter of 08/02/19 (from the past 48 hour(s))  Prepare RBC     Status: None   Collection Time: 08/05/19  9:12 AM  Result Value Ref Range   Order Confirmation      ORDER PROCESSED BY BLOOD BANK Performed at [facility] [address] [place], [place] [address]   Glucose, capillary     Status: Abnormal   Collection Time: 08/05/19 12:04 PM  Result Value Ref Range   Glucose-Capillary 192 (H) 70 - 99 mg/dL  Glucose, capillary     Status: Abnormal   Collection Time: 08/05/19  4:59 PM  Result Value Ref Range   Glucose-Capillary 196 (H) 70 - 99 mg/dL  Hemoglobin and hematocrit, blood     Status: Abnormal   Collection Time: 08/05/19  5:06 PM  Result Value Ref Range   Hemoglobin 6.4 (LL) 12.0 - 15.0 g/dL    Comment: This critical result has verified and been called to [facility] RN by Karin Golden on 11 23 2020 at 1727, and has been read back. CRITICAL HGB RBV NELSON,L RN 1728   HCT 21.1 (L) 36.0 - 46.0 %    Comment: Performed at [facility], [place] [address], [place], [place] [address]  Prepare RBC     Status: None   Collection Time: 08/05/19  5:55 PM  Result Value Ref Range   Order Confirmation      ORDER  PROCESSED BY BLOOD BANK Performed at [facility], [place] [address] [place], [place] [address]   Glucose, capillary     Status: Abnormal   Collection Time: 08/05/19  7:36 PM  Result Value Ref Range   Glucose-Capillary 128 (H) 70 - 99 mg/dL  Hemoglobin and hematocrit, blood     Status: Abnormal   Collection Time: 08/05/19 10:13 PM  Result Value Ref Range   Hemoglobin 6.6 (LL) 12.0 - 15.0 g/dL    Comment: REPEATED TO VERIFY CRITICAL VALUE NOTED.  VALUE IS CONSISTENT WITH PREVIOUSLY REPORTED AND CALLED VALUE.    HCT 21.8 (L) 36.0 - 46.0 %    Comment: Performed at [facility], [place] [address], [place], [place] [address]  Type and screen [place]     Status: None   Collection Time: 08/05/19 10:32 PM  Result Value Ref Range   ABO/RH(D) O POS    Antibody Screen NEG    Sample Expiration 08/08/2019,2359    Unit Number [id number]    Blood Component Type RED CELLS,LR    Unit division 00    Status of Unit ISSUED,FINAL    Transfusion Status OK TO TRANSFUSE    Crossmatch Result      Compatible Performed at [facility], [place] [address] [place], [place] [address]   Glucose, capillary     Status: Abnormal   Collection Time: 08/06/19  1:05 AM  Result Value Ref Range   Glucose-Capillary 124 (H) 70 - 99 mg/dL  Glucose, capillary     Status: None   Collection Time: 08/06/19  4:21 AM  Result Value Ref Range   Glucose-Capillary 98 70 - 99 mg/dL  BMP daily     Status: Abnormal   Collection Time: 08/06/19  5:46 AM  Result Value Ref Range   Sodium 140 135 - 145 mmol/L   Potassium 3.5 3.5 - 5.1 mmol/L   Chloride 105 98 - 111 mmol/L   CO2 27 22 - 32 mmol/L   Glucose, Bld 111 (H) 70 - 99 mg/dL   BUN 9 8 - 23 mg/dL   Creatinine, Ser 1.44 (H) 0.44 - 1.00 mg/dL   Calcium 7.3 (L) 8.9 - 10.3 mg/dL   GFR calc non Af Amer 37 (L) >60 mL/min   GFR calc Af Amer 43 (L) >60 mL/min   Anion gap 8 5 - 15    Comment: Performed  at [facility], [place] [address], [place], [place] [address]  CBC in AM     Status: Abnormal   Collection Time: 08/06/19  5:46 AM  Result Value Ref Range   WBC 8.0 4.0 - 10.5 K/uL   RBC 2.91 (L) 3.87 - 5.11 MIL/uL   Hemoglobin 8.6 (L) 12.0 - 15.0 g/dL    Comment: REPEATED TO VERIFY POST TRANSFUSION SPECIMEN DELTA CHECK NOTED    HCT 27.6 (L) 36.0 - 46.0 %   MCV 94.8 80.0 - 100.0 fL   MCH 29.6 26.0 - 34.0 pg   MCHC 31.2 30.0 - 36.0 g/dL   RDW 14.9 11.5 - 15.5 %   Platelets 170 150 - 400 K/uL   nRBC 0.4 (H) 0.0 - 0.2 %    Comment: Performed at [facility], [place] [address] [place], [place] [address]  Magnesium in AM     Status: None   Collection Time: 08/06/19  5:46 AM  Result Value Ref Range   Magnesium 1.9 1.7 - 2.4 mg/dL    Comment: Performed at [facility], [place] [address], [place], [place] [address]  Protime-INR     Status: Abnormal   Collection Time: 08/06/19  5:46 AM  Result Value Ref Range   Prothrombin Time 18.6 (H) 11.4 - 15.2 seconds   INR 1.6 (H) 0.8 - 1.2    Comment: (NOTE) INR goal varies based on device and disease states. Performed at [facility], [place] [address], [place], [place] [address]   Glucose, capillary     Status: None   Collection Time: 08/06/19  7:46 AM  Result Value Ref Range   Glucose-Capillary 99 70 - 99 mg/dL  Glucose, capillary     Status: Abnormal   Collection Time: 08/06/19 11:42 AM  Result Value Ref Range   Glucose-Capillary 123 (H) 70 - 99 mg/dL  Hemoglobin and hematocrit, blood     Status: Abnormal   Collection Time: 08/06/19 11:55 AM  Result Value Ref Range   Hemoglobin 8.6 (L) 12.0 - 15.0 g/dL   HCT 27.1 (L) 36.0 - 46.0 %    Comment: Performed at [facility], [place] [address] [place], [place] [address]  Glucose, capillary     Status: Abnormal   Collection Time: 08/06/19  4:50 PM  Result Value Ref Range   Glucose-Capillary  119 (H) 70 - 99  mg/dL  Hemoglobin and hematocrit, blood     Status: Abnormal   Collection Time: 08/06/19  5:52 PM  Result Value Ref Range   Hemoglobin 8.8 (L) 12.0 - 15.0 g/dL   HCT 28.4 (L) 36.0 - 46.0 %    Comment: Performed at [facility], [place] [address] [place], [place] [address]  Glucose, capillary     Status: Abnormal   Collection Time: 08/06/19  8:35 PM  Result Value Ref Range   Glucose-Capillary 130 (H) 70 - 99 mg/dL  Glucose, capillary     Status: Abnormal   Collection Time: 08/07/19 12:26 AM  Result Value Ref Range   Glucose-Capillary 112 (H) 70 - 99 mg/dL  Hemoglobin and hematocrit, blood     Status: Abnormal   Collection Time: 08/07/19 12:27 AM  Result Value Ref Range   Hemoglobin 9.1 (L) 12.0 - 15.0 g/dL   HCT 29.4 (L) 36.0 - 46.0 %    Comment: Performed at [facility], [place] [address], [place], [place] [address]  BMP daily     Status: Abnormal   Collection Time: 08/07/19 12:27 AM  Result Value Ref Range   Sodium 136 135 - 145 mmol/L   Potassium 3.7 3.5 - 5.1 mmol/L   Chloride 102 98 - 111 mmol/L   CO2 27 22 - 32 mmol/L   Glucose, Bld 115 (H) 70 - 99 mg/dL   BUN 10 8 - 23 mg/dL   Creatinine, Ser 1.70 (H) 0.44 - 1.00 mg/dL   Calcium 7.5 (L) 8.9 - 10.3 mg/dL   GFR calc non Af Amer 31 (L) >60 mL/min   GFR calc Af Amer 36 (L) >60 mL/min   Anion gap 7 5 - 15    Comment: Performed at [facility] [address], [place], [place] [address]  CBC in AM     Status: Abnormal   Collection Time: 08/07/19 12:27 AM  Result Value Ref Range   WBC 9.2 4.0 - 10.5 K/uL   RBC 3.11 (L) 3.87 - 5.11 MIL/uL   Hemoglobin 9.2 (L) 12.0 - 15.0 g/dL   HCT 29.9 (L) 36.0 - 46.0 %   MCV 96.1 80.0 - 100.0 fL   MCH 29.6 26.0 - 34.0 pg   MCHC 30.8 30.0 - 36.0 g/dL   RDW 15.3 11.5 - 15.5 %   Platelets 176 150 - 400 K/uL   nRBC 0.3 (H) 0.0 - 0.2 %    Comment: Performed at [facility], [place] [address] [place], [place] [address]   Magnesium in AM     Status: None   Collection Time: 08/07/19 12:27 AM  Result Value Ref Range   Magnesium 1.9 1.7 - 2.4 mg/dL    Comment: Performed at [facility], [place] [address] [place], [place] [address]  Protime-INR     Status: Abnormal   Collection Time: 08/07/19 12:27 AM  Result Value Ref Range   Prothrombin Time 17.0 (H) 11.4 - 15.2 seconds   INR 1.4 (H) 0.8 - 1.2    Comment: (NOTE) INR goal varies based on device and disease states. Performed at [facility], [place] [address] [place], [place] [address]   Glucose, capillary     Status: None   Collection Time: 08/07/19  4:47 AM  Result Value Ref Range   Glucose-Capillary 89 70 - 99 mg/dL  Hemoglobin and hematocrit, blood     Status: Abnormal  Collection Time: 08/07/19  5:37 AM  Result Value Ref Range   Hemoglobin 9.4 (L) 12.0 - 15.0 g/dL   HCT 30.3 (L) 36.0 - 46.0 %    Comment: Performed at [facility], [place] [address], [place], [place] [address]  Glucose, capillary     Status: Abnormal   Collection Time: 08/07/19  7:24 AM  Result Value Ref Range   Glucose-Capillary 187 (H) 70 - 99 mg/dL    Studies/Results: No results found.  Medications: I have reviewed the patient's current medications.  Assessment: Lower GI bleeding likely diverticular, has received 5 units PRBC and 1 unit FFP since admission Not had bowel movements in the last 24 hours Hemoglobin remained stable at 9.1/9.2 and 9.4 PT/INR is at 17/1.4   Multiple comorbidities-morbidly obese, COPD on oxygen via nasal cannula, DVT was on Coumadin, chronic kidney disease  Plan: Change diet to full liquids, with plans to advance to regular if no further bleeding noted by today evening. Would hold off anticoagulation at least for a week. If patient has recurrence of rectal bleed, recommend stat bleeding scan at that point for possible IR guided embolization.  [name], MD 08/07/2019, 8:39 AM

## 2019-08-07 NOTE — Progress Notes (Signed)
PROGRESS NOTE    Sue [name]  [id number] DOB: [birth year] DOA: 08/02/2019 PCP: Ann Held, DO    Brief Narrative:   Sue [name] is a 68 year old female with history of recent DVT on Coumadin, chronic hypoxic respiratory failure on 3 L oxygen at baseline, insulin-dependent diabetes mellitus type 2, presented with 2-day history of bright red blood per rectum.  Recently started on Coumadin in June 2020.  Hemoglobin was noted to be 4.6 on admission with an INR of 2.4.  EDP consulted gastroenterology and requested TRH for admission for likely lower GI bleed.   Assessment & Plan:   Principal Problem:   GI bleed Active Problems:   DVT (deep venous thrombosis) (HCC)   Diabetes mellitus type II, uncontrolled (HCC)   Symptomatic anemia   QT prolongation   Lower GI bleed, suspect diverticular Acute blood loss anemia Patient presenting with 2-day history of bright red blood per rectum, complicated by recent diagnosis of DVT currently on Coumadin.  Hemoglobin 4.6 on admission with an INR of 2.4.  Transfused 3 units PRBCs, 1 unit FFP, and received IV vitamin K.  Nuclear med bleeding scan notable for right lower abdomen with concerning for active GI bleed correlating to the sigmoid versus small bowel. Colonoscopy 08/05/2019 with findings of red blood found in the entire colon with scattered small mouth diverticula, internal hemorrhoids, multiple maroon-colored clots left colon with poor visualization and despite repeated irrigation and aspiration cannot pinpoint active bleeding site.  Hemoglobin dropped from 8.0-6.4 following colonoscopy on 08/05/2019, transfuse an additional 2 units PRBCs.  Consulted interventional radiology 08/06/2019 with recommendations of repeat nuclear medicine scan rather than angiogram/embolization at this time.  Reevaluated by GI, holding off on repeat NM scan as it appears bleeding has resolved. --Gastroenterology following, appreciate assistance --Hgb  8.0-->6.4-->6.6-->8.6-->9.1-->9.2-->9.4 --No bowel movement over the past 24 hours --Diet advanced to full liquids, with plans to advance to regular if no further bleeding noted today --Continue to hold anticoagulation x1 week --Continue to monitor H&H every 6 hours and transfuse for hemoglobin < 7.0 --If recurrence of bleeding, stat NM bleeding scan for possible IR guided embolization.  Acute on chronic CKD stage IIIb Baseline creatinine of the past year 1.5-2.5 with GFR roughly 40.  Patient admitted with lower GI bleed with significant anemia, suspicious for ATN versus prerenal. --Creatinine 1.44-->1.70 today --s/p transfusion 5 units PRBCs during hospitalization --Hemoglobin appears stable now --Start NS at 100 mL's per hour --Avoid nephrotoxins, renally dose all medications  Type 2 diabetes mellitus, insulin-dependent Hemoglobin A1c, 6.5, well controlled.  Glipizide 5 mg p.o. twice daily, Metformin 1000 mg p.o. twice daily, units subcutaneously daily. --Holding home medications at this time --Continue insulin sliding scale for coverage  Left lower extremity DVT, June 2020, provoked Secondary to fracture and poor mobility.  INR 2.4 on admission, within normal range.  Was reversed with IV vitamin K and FFP secondary to GI bleed as above. --Continue to hold Coumadin given acute GI bleed as above  Emphysema and chronic hypoxic respiratory failure Uses 3 L supplemental oxygen at home 24 hours. Increased to 4 L in the ED. Has worsening dyspnea due to acute blood loss anemia. Lungs clear on auscultation. Chest x-ray negative. --Currently on 3 L nasal cannula, oxygenating 100% --Continue supplemental oxygen, titrate for SPO2 greater than 88%  Insulin-dependent diabetes mellitus Hemoglobin A1c 6.5.  --Continue sliding scale insulin --CBG checks every 4 hours as patient is n.p.o.  Prolonged QTC QTC noted to be 526.  Holding  home Seroquel for now.  Depression/anxiety --Continue  buspirone 15 mg p.o. daily, Ativan prn --Holding home Seroquel secondary to prolonged QTC  Pulmonary nodule CT showing partially visualized 5 mm right lower lobe pulmonary nodule.Patient is a former smoker with a 40-pack-year smoking history. Will need repeat noncontrast chest CT in 12 months for follow-up  Morbid obesity: BMI 52.60, discussed with patient need for aggressive lifestyle changes and weight loss as this complicates all facets of care.   DVT prophylaxis: SCDs, chemical DVT prophylaxis contraindicated in acute GI bleed Code Status: Full code Family Communication:  Disposition Plan: Remain inpatient, further depending on clinical course   Consultants:   [facility]  Interventional radiology  Procedures:  Colonoscopy 08/05/2019: Impression:                - Preparation of the colon was unsatisfactory. - Preparation of the colon was inadequate. - Blood in the entire examined colon. - Diverticulosis in the sigmoid colon. - Internal hemorrhoids. - The examined portion of the ileum was normal. - No specimens collected.  Recommendation:            - Clear liquid diet. - Miralax to flush the remaining blood and clots from the colonoscopy. If rebleeds then will need an  angiogram with possible emoblization. Continue to hold anticoagulation. - Repeat colonoscopy as an outpt because the bowel preparation was poor.   Antimicrobials:   None   Subjective: Patient seen and examined bedside, resting comfortably.  States symptoms of dizziness, fatigue are much improved today.  No bowel movement for the past 24 hours.  Tolerating diet.  Hemoglobin appears stable, 9.4 this morning.  GI advancing diet to full liquids, and may further advance to regular if no further bleeding.  No other complaints or concerns at this time. Patient states dizziness, fatigue, weakness has improved transfusion overnight.  No other complaints this morning. Currently denies headache, no  dizziness, no chest pain, palpitations, no nausea vomiting, no abdominal pain, no weakness, no fatigue, no paresthesias.  No acute events overnight per nursing staff.  Objective: Vitals:   08/06/19 2032 08/06/19 2103 08/07/19 0034 08/07/19 0502  BP: (!) 86/51 (!) 92/54 108/65 135/76  Pulse: 77  76 83  Resp: 20   20  Temp: 97.7 F (36.5 C)   100 F (37.8 C)  TempSrc: Oral   Oral  SpO2: 99%   100%  Weight:      Height:        Intake/Output Summary (Last 24 hours) at 08/07/2019 1120 Last data filed at 08/07/2019 1000 Gross per 24 hour  Intake 541.77 ml  Output 200 ml  Net 341.77 ml   Filed Weights   08/02/19 1656 08/05/19 0835  Weight: 134.7 kg 134.7 kg    Examination:  General exam: Appears calm and comfortable, obese Respiratory system: Clear to auscultation. Respiratory effort normal. Cardiovascular system: S1 & S2 heard, RRR. No JVD, murmurs, rubs, gallops or clicks. No pedal edema. Gastrointestinal system: Abdomen is nondistended, soft and nontender. No organomegaly or masses felt.  Decreased bowel sounds. Central nervous system: Alert and oriented. No focal neurological deficits. Extremities: Symmetric 5 x 5 power. Skin: No rashes, lesions or ulcers Psychiatry: Judgement and insight appear normal. Mood & affect appropriate.     Data Reviewed: I have personally reviewed following labs and imaging studies  CBC: Recent Labs  Lab 08/04/19 0119 08/04/19 0507  08/05/19 0459  08/06/19 0546 08/06/19 1155 08/06/19 1752 08/07/19 0027 08/07/19 0537  WBC 8.7  8.8  --  9.6  --  8.0  --   --  9.2  --   HGB 8.3* 7.9*   < > 8.0*   < > 8.6* 8.6* 8.8* 9.2*   9.1* 9.4*  HCT 26.9* 25.5*   < > 26.7*   < > 27.6* 27.1* 28.4* 29.9*   29.4* 30.3*  MCV 92.4 92.1  --  94.7  --  94.8  --   --  96.1  --   PLT 182 185  --  215  --  170  --   --  176  --    < > = values in this interval not displayed.   Basic Metabolic Panel: Recent Labs  Lab 08/04/19 0119 08/04/19 0507  08/05/19 0459 08/06/19 0546 08/07/19 0027  NA 140 138 136 140 136  K 3.0* 3.1* 3.4* 3.5 3.7  CL 102 102 103 105 102  CO2 29 29 24 27 27   GLUCOSE 183* 159* 148* 111* 115*  BUN 21 20 10 9 10   CREATININE 1.39* 1.29* 1.25* 1.44* 1.70*  CALCIUM 7.5* 7.4* 7.3* 7.3* 7.5*  MG 2.4 3.0* 2.2 1.9 1.9   GFR: Estimated Creatinine Clearance: 43.2 mL/min (A) (by C-G formula based on SCr of 1.7 mg/dL (H)). Liver Function Tests: Recent Labs  Lab 08/02/19 1837  AST 21  ALT 12  ALKPHOS 127*  BILITOT 0.6  PROT 7.4  ALBUMIN 3.1*   No results for input(s): LIPASE, AMYLASE in the last 168 hours. No results for input(s): AMMONIA in the last 168 hours. Coagulation Profile: Recent Labs  Lab 08/02/19 1837 08/03/19 1647 08/05/19 0459 08/06/19 0546 08/07/19 0027  INR 2.4* 1.8* 1.5* 1.6* 1.4*   Cardiac Enzymes: No results for input(s): CKTOTAL, CKMB, CKMBINDEX, TROPONINI in the last 168 hours. BNP (last 3 results) No results for input(s): PROBNP in the last 8760 hours. HbA1C: No results for input(s): HGBA1C in the last 72 hours. CBG: Recent Labs  Lab 08/06/19 1650 08/06/19 2035 08/07/19 0026 08/07/19 0447 08/07/19 0724  GLUCAP 119* 130* 112* 89 187*   Lipid Profile: No results for input(s): CHOL, HDL, LDLCALC, TRIG, CHOLHDL, LDLDIRECT in the last 72 hours. Thyroid Function Tests: No results for input(s): TSH, T4TOTAL, FREET4, T3FREE, THYROIDAB in the last 72 hours. Anemia Panel: No results for input(s): VITAMINB12, FOLATE, FERRITIN, TIBC, IRON, RETICCTPCT in the last 72 hours. Sepsis Labs: No results for input(s): PROCALCITON, LATICACIDVEN in the last 168 hours.  Recent Results (from the past 240 hour(s))  SARS CORONAVIRUS 2 (TAT 6-24 HRS) Nasopharyngeal Nasopharyngeal Swab     Status: None   Collection Time: 08/02/19  6:41 PM   Specimen: Nasopharyngeal Swab  Result Value Ref Range Status   SARS Coronavirus 2 NEGATIVE NEGATIVE Final    Comment: (NOTE) SARS-CoV-2 target nucleic  acids are NOT DETECTED. The SARS-CoV-2 RNA is generally detectable in upper and lower respiratory specimens during the acute phase of infection. Negative results do not preclude SARS-CoV-2 infection, do not rule out co-infections with other pathogens, and should not be used as the sole basis for treatment or other patient management decisions. Negative results must be combined with clinical observations, patient history, and epidemiological information. The expected result is Negative. Fact Sheet for Patients: SugarRoll.be Fact Sheet for Healthcare Providers: https://www.woods-mathews.com/ This test is not yet approved or cleared by the [place] FDA and  has been authorized for detection and/or diagnosis of SARS-CoV-2 by FDA under an Emergency Use Authorization (EUA). This EUA will remain  in effect (meaning this test can be used) for the duration of the COVID-19 declaration under Section 56 4(b)(1) of the Act, 21 U.S.C. section 360bbb-3(b)(1), unless the authorization is terminated or revoked sooner. Performed at [facility], [place] [address], [place], [place] [address]          Radiology Studies: No results found.      Scheduled Meds:  sodium chloride   Intravenous Once   busPIRone  15 mg Oral Daily   insulin aspart  0-9 Units Subcutaneous Q4H   levothyroxine  50 mcg Oral Q0600   lisinopril  20 mg Oral Daily   metoprolol succinate  100 mg Oral Daily   polyethylene glycol  17 g Oral TID   Continuous Infusions:  sodium chloride 100 mL/hr at 08/07/19 0810     LOS: 5 days    Time spent: 33 minutes spent on chart review, discussion with nursing staff, consultants, updating family and interview/physical exam; more than 50% of that time was spent in counseling and/or coordination of care.    Aleese Kamps J [place] DO [facility] 08/07/2019, 11:20 AM

## 2019-08-07 NOTE — Plan of Care (Signed)
  Problem: Education: Goal: Knowledge of General Education information will improve Description: Including pain rating scale, medication(s)/side effects and non-pharmacologic comfort measures Outcome: Progressing   Problem: Health Behavior/Discharge Planning: Goal: Ability to manage health-related needs will improve Outcome: Progressing   Problem: Clinical Measurements: Goal: Ability to maintain clinical measurements within normal limits will improve Outcome: Progressing Goal: Will remain free from infection Outcome: Completed/Met Goal: Diagnostic test results will improve Outcome: Completed/Met Goal: Respiratory complications will improve Outcome: Progressing Goal: Cardiovascular complication will be avoided Outcome: Completed/Met   Problem: Activity: Goal: Risk for activity intolerance will decrease Outcome: Progressing   Problem: Nutrition: Goal: Adequate nutrition will be maintained Outcome: Progressing   Problem: Coping: Goal: Level of anxiety will decrease Outcome: Progressing   Problem: Elimination: Goal: Will not experience complications related to bowel motility Outcome: Progressing   Problem: Pain Managment: Goal: General experience of comfort will improve Outcome: Completed/Met   Problem: Safety: Goal: Ability to remain free from injury will improve Outcome: Progressing   Problem: Skin Integrity: Goal: Risk for impaired skin integrity will decrease Outcome: Progressing

## 2019-08-08 LAB — BASIC METABOLIC PANEL
Anion gap: 7 (ref 5–15)
BUN: 9 mg/dL (ref 8–23)
CO2: 25 mmol/L (ref 22–32)
Calcium: 7.9 mg/dL — ABNORMAL LOW (ref 8.9–10.3)
Chloride: 105 mmol/L (ref 98–111)
Creatinine, Ser: 1.28 mg/dL — ABNORMAL HIGH (ref 0.44–1.00)
GFR calc Af Amer: 50 mL/min — ABNORMAL LOW (ref >60)
GFR calc non Af Amer: 43 mL/min — ABNORMAL LOW (ref >60)
Glucose, Bld: 107 mg/dL — ABNORMAL HIGH (ref 70–99)
Potassium: 3.5 mmol/L (ref 3.5–5.1)
Sodium: 137 mmol/L (ref 135–145)

## 2019-08-08 LAB — HEMOGLOBIN AND HEMATOCRIT, BLOOD
HCT: 27.6 % — ABNORMAL LOW (ref 36.0–46.0)
Hemoglobin: 8.6 g/dL — ABNORMAL LOW (ref 12.0–15.0)

## 2019-08-08 LAB — GLUCOSE, CAPILLARY
Glucose-Capillary: 117 mg/dL — ABNORMAL HIGH (ref 70–99)
Glucose-Capillary: 150 mg/dL — ABNORMAL HIGH (ref 70–99)
Glucose-Capillary: 151 mg/dL — ABNORMAL HIGH (ref 70–99)
Glucose-Capillary: 158 mg/dL — ABNORMAL HIGH (ref 70–99)

## 2019-08-08 MED ORDER — WARFARIN SODIUM 5 MG PO TABS: 2.5000 mg | ORAL_TABLET | Freq: Every day | ORAL | 0 refills | Status: DC

## 2019-08-08 MED ORDER — WARFARIN SODIUM 2.5 MG PO TABS: 2.5000 mg | ORAL_TABLET | Freq: Every day | ORAL | 3 refills | Status: DC

## 2019-08-08 NOTE — Progress Notes (Signed)
[facility] Progress Note  Sue [name] 68 y.o. Feb 08, 1951   Subjective: Feels good. Reports brown stool overnight. Tolerating diet.  Objective: Vital signs: Vitals:   08/08/19 0403 08/08/19 0933  BP: (!) 102/55 127/75  Pulse: 74 85  Resp: 18   Temp: 98.8 F (37.1 C)   SpO2: 100%     Physical Exam: Gen: lethargic, no acute distress, morbidly obese HEENT: anicteric sclera CV: RRR Chest: CTA B Abd: soft, nontender, nondistended, +BS   Lab Results: Recent Labs    08/06/19 0546 08/07/19 0027 08/08/19 0521  NA 140 136 137  K 3.5 3.7 3.5  CL 105 102 105  CO2 27 27 25   GLUCOSE 111* 115* 107*  BUN 9 10 9   CREATININE 1.44* 1.70* 1.28*  CALCIUM 7.3* 7.5* 7.9*  MG 1.9 1.9  --    No results for input(s): AST, ALT, ALKPHOS, BILITOT, PROT, ALBUMIN in the last 72 hours. Recent Labs    08/06/19 0546  08/07/19 0027  08/07/19 2254 08/08/19 0521  WBC 8.0  --  9.2  --   --   --   HGB 8.6*   < > 9.2*  9.1*   < > 8.7* 8.6*  HCT 27.6*   < > 29.9*  29.4*   < > 28.3* 27.6*  MCV 94.8  --  96.1  --   --   --   PLT 170  --  176  --   --   --    < > = values in this interval not displayed.      Assessment/Plan: Resolved GI bleed likely diverticular. Hgb stable at 8.6. No further bleeding. Tolerating solid food. Agree with Dr. Therisa Doyne to hold anticoagulation for a week and then resume if deemed necessary. Ok to go home today from a GI standpoint. F/U with me or Dr. Therisa Doyne in 3-4 weeks. Will need a repeat colonoscopy in early 2021 with 2 day prep.   Sue [name] 08/08/2019, 11:12 AM  Questions please call [phone] ID: Sue [name], female   DOB: [birth year], 68 y.o.   MRN: [id number]

## 2019-08-08 NOTE — Evaluation (Signed)
Physical Therapy Evaluation Patient Details Name: Sue [name] MRN: [id number] DOB: [birth year] Today's Date: 08/08/2019   History of Present Illness  68 year old female with history of recent DVT on Coumadin, chronic hypoxic respiratory failure on 3 L oxygen at baseline, insulin-dependent diabetes mellitus type 2, presented with 2-day history of bright red blood per rectum.  Recently started on Coumadin in June 2020.  Hemoglobin was noted to be 4.6 on admission with an INR of 2.4.  Clinical Impression  Pt admitted with above diagnosis. Pt ambulated 80' with RW, SaO2 97% on 3L O2 walking, 3/4 dyspnea, distance limited by SOB. Pt walks short distances at baseline 2* dyspnea and multiple LLE fractures from a fall in June 2020. She has needed assistance from a CNA and from her daughter. She is safe to DC home from PT standpoint.  Pt currently with functional limitations due to the deficits listed below (see PT Problem List). Pt will benefit from skilled PT to increase their independence and safety with mobility to allow discharge to the venue listed below.       Follow Up Recommendations No PT follow up    Equipment Recommendations  None recommended by PT    Recommendations for Other Services       Precautions / Restrictions Precautions Precautions: Fall Precaution Comments: h/o of a fall in June 2020, pt reports she broke multiple bones in L foot and L tib/fib, no surgery required Restrictions Weight Bearing Restrictions: No      Mobility  Bed Mobility Overal bed mobility: Modified Independent             General bed mobility comments: HOB up 35*, used rail  Transfers Overall transfer level: Needs assistance Equipment used: Rolling walker (2 wheeled) Transfers: Sit to/from Stand Sit to Stand: Min guard         General transfer comment: uses momentum for sit to stand, no physical assist needed  Ambulation/Gait Ambulation/Gait assistance: Min guard Gait Distance  (Feet): 90 Feet Assistive device: Rolling walker (2 wheeled) Gait Pattern/deviations: Step-through pattern;Decreased stride length Gait velocity: WFL   General Gait Details: steady, no loss of balance, SaO2 97% on 3L [place] O2 walking, 3/4 dyspnea, distance limited by SOB  Stairs            Wheelchair Mobility    Modified Rankin (Stroke Patients Only)       Balance Overall balance assessment: Modified Independent;History of Falls                                           Pertinent Vitals/Pain Pain Assessment: No/denies pain    Home Living Family/patient expects to be discharged to:: Private residence Living Arrangements: Alone Available Help at Discharge: Family;Available PRN/intermittently; daughter comes daily to assist with ADLs, pt has CNA too   Home Access: Stairs to enter Entrance Stairs-Rails: Can reach both;Right;Left Entrance Stairs-Number of Steps: 2 Home Layout: One level Home Equipment: Clinical cytogeneticist - 2 wheels Additional Comments: 3L O2 24* at baseline    Prior Function Level of Independence: Independent with assistive device(s)         Comments: furniture walks in home, uses RW going out of home, has walked short distances since fall in June with multiple L lower leg fractures (non-operative)     Hand Dominance        Extremity/Trunk Assessment   Upper Extremity Assessment Upper  Extremity Assessment: Overall WFL for tasks assessed    Lower Extremity Assessment Lower Extremity Assessment: Overall WFL for tasks assessed    Cervical / Trunk Assessment Cervical / Trunk Assessment: Normal  Communication   Communication: No difficulties  Cognition Arousal/Alertness: Awake/alert Behavior During Therapy: WFL for tasks assessed/performed Overall Cognitive Status: Within Functional Limits for tasks assessed                                        General Comments      Exercises     Assessment/Plan     PT Assessment Patient needs continued PT services  PT Problem List Decreased activity tolerance;Decreased mobility       PT Treatment Interventions Gait training;Functional mobility training;Therapeutic exercise;Therapeutic activities;Patient/family education    PT Goals (Current goals can be found in the Care Plan section)  Acute Rehab PT Goals Patient Stated Goal: be able to walk farther PT Goal Formulation: With patient Time For Goal Achievement: 08/22/19 Potential to Achieve Goals: Good    Frequency Min 3X/week   Barriers to discharge        Co-evaluation               AM-PAC PT "6 Clicks" Mobility  Outcome Measure Help needed turning from your back to your side while in a flat bed without using bedrails?: A Little Help needed moving from lying on your back to sitting on the side of a flat bed without using bedrails?: A Little Help needed moving to and from a bed to a chair (including a wheelchair)?: A Little Help needed standing up from a chair using your arms (e.g., wheelchair or bedside chair)?: None Help needed to walk in hospital room?: None Help needed climbing 3-5 steps with a railing? : A Little 6 Click Score: 20    End of Session Equipment Utilized During Treatment: Gait belt;Oxygen Activity Tolerance: Patient tolerated treatment well Patient left: in bed;with call bell/phone within reach;with bed alarm set Nurse Communication: Mobility status PT Visit Diagnosis: Difficulty in walking, not elsewhere classified (R26.2)    Time: [id number] PT Time Calculation (min) (ACUTE ONLY): 21 min   Charges:   PT Evaluation $PT Eval Low Complexity: 1 Low          Philomena Doheny PT 08/08/2019  Acute Rehabilitation Services Pager [phone] Office [phone]

## 2019-08-08 NOTE — Discharge Summary (Signed)
Physician Discharge Summary  Sue [name] ACZ:[id number] DOB: [birth year] DOA: 08/02/2019  PCP: Ann Held, DO  Admit date: 08/02/2019 Discharge date: 08/08/2019  Admitted From: Home Disposition: Home  Recommendations for Outpatient Follow-up:  1. Follow up with PCP in 1-2 weeks 2. Follow-up with [facility] gastroenterology in 3-4 weeks, either Dr. Therisa Doyne or Dr. Michail Sermon 3. Please obtain BMP/CBC in one week 4. Continue to hold Coumadin 7 days following discharge per GI recommendations 5. Will need repeat colonoscopy early 2021 with 2-day prep per GI 6. Repeat CT chest in 12 months for incidental finding of pulmonary nodule  Home Health: No Equipment/Devices: Continues on home supplemental oxygen via nasal cannula at 3 L/min  Discharge Condition: Stable CODE STATUS: Full code Diet recommendation: Heart Healthy / Carb Modified   History of present illness:  Sue [name] is a 68 year old female with history of recent DVT on Coumadin, chronic hypoxic respiratory failure on 3 L oxygen at baseline, insulin-dependent diabetes mellitus type 2, presented with 2-day history of bright red blood per rectum.  Recently started on Coumadin in June 2020.  Hemoglobin was noted to be 4.6 on admission with an INR of 2.4.  EDP consulted gastroenterology and requested TRH for admission for likely lower GI bleed.  Hospital course: Lower GI bleed, suspect diverticular Acute blood loss anemia Patient presenting with 2-day history of bright red blood per rectum, complicated by recent diagnosis of DVT currently on Coumadin.  Hemoglobin 4.6 on admission with an INR of 2.4.  Transfused 3 units PRBCs, 1 unit FFP, and received IV vitamin K.  Nuclear med bleeding scan notable for right lower abdomen with concerning for active GI bleed correlating to the sigmoid versus small bowel. Colonoscopy 08/05/2019 with findings of red blood found in the entire colon with scattered small mouth diverticula, internal  hemorrhoids, multiple maroon-colored clots left colon with poor visualization and despite repeated irrigation and aspiration cannot pinpoint active bleeding site.  Hemoglobin dropped from 8.0-6.4 following colonoscopy on 08/05/2019, transfuse an additional 2 units PRBCs.  Consulted interventional radiology 08/06/2019 with recommendations of repeat nuclear medicine scan rather than angiogram/embolization at this time.  Reevaluated by GI, holding off on repeat NM scan as it appears bleeding has resolved.  Patient's hemoglobin remained stable, 8.6 at time of discharge.  Patient's diet was advanced with toleration and no further blood in stool.  GI recommends continue to hold Coumadin 7 days following discharge.  Will need repeat CBC in 1 week.  Will need follow-up with GI, either Dr. Therisa Doyne or Dr. Michail Sermon in 3-4 weeks with plan repeat colonoscopy early 2021 with 2-day prep.  Acute on chronic CKD stage IIIb Baseline creatinine of the past year 1.5-2.5 with GFR roughly 40.  Patient admitted with lower GI bleed with significant anemia, suspicious for ATN versus prerenal.  Creatinine peaked at 1.70.  Started on IV fluid hydration and status post transfusions 5 units PRBCs during hospitalization.  Creatinine improved and was 1.28 at time of discharge.  Recommend repeat BMP in 1 week.  Type 2 diabetes mellitus, insulin-dependent Hemoglobin A1c, 6.5, well controlled.  Glipizide 5 mg p.o. twice daily, Metformin 1000 mg p.o. twice daily, and Lantus 30 units units subcutaneously daily.  Left lower extremity DVT, June 2020, provoked Secondary to fracture and poor mobility.  INR 2.4 on admission, within normal range.  Was reversed with IV vitamin K and FFP secondary to GI bleed as above.  GI recommends to continue to hold Coumadin 7 days following discharge.  Follow-up  with PCP, regarding recommendations to continue versus discontinue Coumadin at this time.  Emphysema and chronic hypoxic respiratory failure Uses 3  L supplemental oxygen at home 24 hours. Increased to 4 L in the ED. Has worsening dyspnea due to acute blood loss anemia. Lungs clear on auscultation. Chest x-ray negative.  Oxygen now back to her normal baseline following blood administration and control of her lower GI bleed.  Prolonged QTC QTC noted to be 526.  Resume home Seroquel.  Recommend follow-up with PCP for further recommendations on continued Seroquel versus alternative agent.  Recommend repeat EKG at PCP visit to follow QTC.  Depression/anxiety Continue buspirone 15 mg p.o. daily, Ativan prn, Seroquel.  Pulmonary nodule CT showing partially visualized 5 mm right lower lobe pulmonary nodule.Patient is a former smoker with a 40-pack-year smoking history. Will need repeat noncontrast chest CT in 12 months for follow-up  Morbid obesity: BMI 52.60, discussed with patient need for aggressive lifestyle changes and weight loss as this complicates all facets of care.    Discharge Diagnoses:  Principal Problem:   GI bleed Active Problems:   DVT (deep venous thrombosis) (HCC)   Diabetes mellitus type II, uncontrolled (HCC)   Symptomatic anemia   QT prolongation    Discharge Instructions  Discharge Instructions    Call MD for:  difficulty breathing, headache or visual disturbances   Complete by: As directed    Call MD for:  extreme fatigue   Complete by: As directed    Call MD for:  persistant dizziness or light-headedness   Complete by: As directed    Call MD for:  persistant nausea and vomiting   Complete by: As directed    Call MD for:  severe uncontrolled pain   Complete by: As directed    Call MD for:  temperature >100.4   Complete by: As directed    Diet - low sodium heart healthy   Complete by: As directed    Discharge instructions   Complete by: As directed    Continue to hold Coumadin for 7 days, may resume on 08/15/2019  Make follow-up appoint with PCP in 1 week with repeat CBC and BMP  Make  follow-up with [facility] gastroenterology, either Dr. Therisa Doyne or Dr. Michail Sermon for follow-up in clinic in 3-4 weeks   Increase activity slowly   Complete by: As directed      Allergies as of 08/08/2019      Reactions   Hydrocodone Other (See Comments)   Constipation, hallucinations   Norvasc [amlodipine Besylate]    Marked swelling      Medication List    STOP taking these medications   enoxaparin 150 MG/ML injection Commonly known as: LOVENOX   methocarbamol 500 MG tablet Commonly known as: ROBAXIN   ondansetron 4 MG tablet Commonly known as: Zofran     TAKE these medications   Accu-Chek FastClix Lancets Misc USE TO CHECK BLOOD SUGAR UP TO FOUR TIMES DAILY AS DIRECTED   Accu-Chek Guide test strip Generic drug: glucose blood USE TO CHECK BLOOD SUGAR UP TO FOUR TIMES DAILY   albuterol 108 (90 Base) MCG/ACT inhaler Commonly known as: VENTOLIN HFA Inhale 1-2 puffs into the lungs every 6 (six) hours as needed for wheezing or shortness of breath.   albuterol (2.5 MG/3ML) 0.083% nebulizer solution Commonly known as: PROVENTIL Take 3 mLs (2.5 mg total) by nebulization every 6 (six) hours as needed for wheezing or shortness of breath.   blood glucose meter kit and supplies Kit Dispense  based on patient and insurance preference. Use up to four times daily as directed. (FOR ICD-9 250.00, 250.01).   busPIRone 15 MG tablet Commonly known as: BUSPAR Take 15 mg by mouth daily.   famotidine 20 MG tablet Commonly known as: PEPCID Take 20 mg by mouth daily as needed for heartburn or indigestion.   fluticasone 50 MCG/ACT nasal spray Commonly known as: FLONASE Place 2 sprays into both nostrils daily as needed for allergies.   glipiZIDE 5 MG tablet Commonly known as: GLUCOTROL Take 1 tablet (5 mg total) by mouth 2 (two) times daily before a meal.   insulin glargine 100 UNIT/ML injection Commonly known as: LANTUS Inject 0.3 mLs (30 Units total) into the skin at bedtime.   Insulin  Syringe-Needle U-100 28G X 5/16" 0.5 ML Misc Use with lantus once a day   levothyroxine 50 MCG tablet Commonly known as: SYNTHROID Take 1 tablet by mouth once daily   lisinopril 20 MG tablet Commonly known as: ZESTRIL Take 1 tablet by mouth once daily   LORazepam 1 MG tablet Commonly known as: ATIVAN Take 0.5-1.5 mg by mouth See admin instructions. Take 0.88m by mouth in the morning and 1.527mby mouth at bedtime as needed for anxiety.   metFORMIN 500 MG tablet Commonly known as: Glucophage Take 2 tablets (1,000 mg total) by mouth 2 (two) times daily with a meal.   metoprolol succinate 50 MG 24 hr tablet Commonly known as: TOPROL-XL TAKE 2 TABLETS BY MOUTH ONCE DAILY IN THE MORNING WITH A MEAL OR IMMEDIATELY FOLLOWING A MEAL What changed:   how much to take  how to take this  when to take this  additional instructions   NONFORMULARY OR COMPOUNDED ITEM Pt/ inr   Dx dvt   Tomorrow 04/05/2019  Please call office [phone] results   NONFORMULARY OR COMPOUNDED ITEM PT/ inr    Dx dvt   NONFORMULARY OR COMPOUNDED ITEM Compression stockings  20-30 mm/hg  #1   Dx low ext edema   OXYGEN Inhale 3 L into the lungs continuous.   potassium chloride SA 20 MEQ tablet Commonly known as: KLOR-CON Take 1 tablet by mouth once daily   QUEtiapine 100 MG tablet Commonly known as: SEROQUEL Take 100 mg by mouth at bedtime.   torsemide 20 MG tablet Commonly known as: DEMADEX Take 2 tablets by mouth once daily   warfarin 5 MG tablet Commonly known as: COUMADIN Take 0.5 tablets (2.5 mg total) by mouth daily at 6 PM. What changed: Another medication with the same name was changed. Make sure you understand how and when to take each.   warfarin 2.5 MG tablet Commonly known as: Coumadin Take 1 tablet (2.5 mg total) by mouth daily. Start taking on: August 15, 2019 What changed: These instructions start on August 15, 2019. If you are unsure what to do until then, ask your doctor  or other care provider.   zolpidem 5 MG tablet Commonly known as: AMBIEN Take 2 tablets (10 mg total) by mouth at bedtime. What changed: how much to take      Follow-up Information    LoAnn HeldDO. Schedule an appointment as soon as possible for a visit in 1 week(s).   Specialty: Family Medicine Contact information: [phone]. [phone]      Gastroenterology, EaSadie HaberSchedule an appointment as soon as possible for a visit in 3 week(s).   Why: Make appointment with Dr. KaTherisa Doyner  Dr. Michail Sermon for follow-up in 3-4 weeks Contact information: [address] [place] [address] [phone]          Allergies  Allergen Reactions  . Hydrocodone Other (See Comments)    Constipation, hallucinations  . Norvasc [Amlodipine Besylate]     Marked swelling    Consultations:  [facility], Dr. Therisa Doyne and Dr. Michail Sermon  Interventional radiology   Procedures/Studies: Dg Chest 2 View  Result Date: 08/02/2019 CLINICAL DATA:  Shortness of breath. EXAM: CHEST - 2 VIEW COMPARISON:  03/11/2019 FINDINGS: Both views are degraded by patient body habitus. Lateral view degraded by patient arm position. Numerous leads and wires project over the chest. Midline trachea. Normal heart size. Atherosclerosis in the transverse aorta. No pleural effusion or pneumothorax. No congestive failure. Clear lungs. IMPRESSION: No acute cardiopulmonary disease. Aortic Atherosclerosis (ICD10-I70.0). Electronically Signed   By: Abigail Miyamoto M.D.   On: 08/02/2019 18:26   Nm Gi Blood Loss  Result Date: 08/03/2019 CLINICAL DATA:  GI bleed. EXAM: NUCLEAR MEDICINE GASTROINTESTINAL BLEEDING SCAN TECHNIQUE: Sequential abdominal images were obtained following intravenous administration of Tc-97mlabeled red blood cells. RADIOPHARMACEUTICALS:  22.2 mCi Tc-961mertechnetate in-vitro labeled red cells. COMPARISON:  CT yesterday. FINDINGS: Vague focal  radiotracer accumulation initially in the central lower abdomen courses to the right of the level of the iliac vessels, beginning at minute 22 on first hour imaging. There is to and fro activity which then dissipates. During second hour imaging focal activity again accumulates just to the right of the iliac vessels in the similar location, and courses centrally., activity than again dissipates. IMPRESSION: Vague radiotracer accumulation in the central right lower abdomen with slight to and fro motion that appears intermittently and dissipates over the course of 2 hours. Findings suggest intermittent GI bleed. It is unclear if this is within tortuous sigmoid colon versus small bowel. These results will be called to the ordering clinician or representative by the Radiologist Assistant, and communication documented in the PACS or zVision Dashboard. Electronically Signed   By: MeKeith Rake.D.   On: 08/03/2019 03:28   Ct Abdomen Pelvis W Contrast  Result Date: 08/02/2019 CLINICAL DATA:  Abdominal pain, rectal bleeding EXAM: CT ABDOMEN AND PELVIS WITH CONTRAST TECHNIQUE: Multidetector CT imaging of the abdomen and pelvis was performed using the standard protocol following bolus administration of intravenous contrast. CONTRAST:  10024mMNIPAQUE IOHEXOL 300 MG/ML  SOLN COMPARISON:  03/08/2019 FINDINGS: Lower chest: Incompletely visualized 5 mm nodular density within the superior aspect of the right lower lobe (series 5, image 1). Lung bases otherwise clear. Hepatobiliary: No focal liver abnormality is seen. Multiple calcified stones within the gallbladder lumen. No evidence of gallbladder wall thickening, pericholecystic inflammation, or biliary dilatation. Pancreas: Unremarkable. No pancreatic ductal dilatation or surrounding inflammatory changes. Spleen: Normal in size without focal abnormality. Adrenals/Urinary Tract: Adrenal glands are unremarkable. Kidneys are normal, without renal calculi, focal lesion, or  hydronephrosis. Bladder is unremarkable. Stomach/Bowel: Stomach is within normal limits. Appendix appears normal. There are a few scattered diverticula of the sigmoid colon. No evidence of bowel wall thickening, distention, or inflammatory changes. Vascular/Lymphatic: Aortic atherosclerosis. Incidental note of retroaortic left renal vein. No enlarged abdominal or pelvic lymph nodes. Reproductive: Status post hysterectomy. No adnexal masses. Other: No free fluid within the abdomen or pelvis. Mild rectus diastasis. Musculoskeletal: No acute osseous abnormality. Chronic grade 1 anterolisthesis L4 on L5. IMPRESSION: 1. No acute abdominopelvic findings. 2. Uncomplicated cholelithiasis. 3. Mild sigmoid diverticulosis without acute diverticulitis. 4.  Partially visualized 5 mm right lower lobe pulmonary nodule. No follow-up needed if patient is low-risk. Non-contrast chest CT can be considered in 12 months if patient is high-risk. This recommendation follows the consensus statement: Guidelines for Management of Incidental Pulmonary Nodules Detected on CT Images: From the Fleischner Society 2017; Radiology 2017; 284:228-243. Aortic Atherosclerosis (ICD10-I70.0). Electronically Signed   By: Davina Poke M.D.   On: 08/02/2019 21:32     Colonoscopy 08/05/2019: Impression:  - Preparation of the colon was unsatisfactory. - Preparation of the colon was inadequate. - Blood in the entire examined colon. - Diverticulosis in the sigmoid colon. - Internal hemorrhoids. - The examined portion of the ileum was normal. - No specimens collected.  Recommendation:  - Clear liquid diet. - Miralax to flush the remaining blood and clotsfrom the colonoscopy. If rebleeds then will need an  angiogram with possible emoblization. Continue to hold anticoagulation. - Repeat colonoscopy as an outpt because the bowel preparation was poor.   Subjective: Patient seen and examined at bedside, resting  comfortably.  About to go work with physical therapy this morning.  Feels much better this morning.  Reports small bowel movement overnight without reported blood, although no documentation per nursing staff.  Tolerating diet.  Hemoglobin remained stable.  No other complaints or concerns at this time.  Denies headache, no dizziness, no chest pain, no palpitations, no shortness of breath, no nausea/vomiting/diarrhea, no abdominal pain, no weakness, no fatigue, no paresthesias.  No acute events overnight per nursing staff.   Discharge Exam: Vitals:   08/08/19 0933 08/08/19 1139  BP: 127/75   Pulse: 85   Resp:    Temp:    SpO2:  98%   Vitals:   08/07/19 2049 08/08/19 0403 08/08/19 0933 08/08/19 1139  BP: 135/86 (!) 102/55 127/75   Pulse: 80 74 85   Resp: 20 18    Temp: 98.8 F (37.1 C) 98.8 F (37.1 C)    TempSrc: Oral Oral    SpO2: 100% 100%  98%  Weight:      Height:        General: Pt is alert, awake, not in acute distress, obese Cardiovascular: RRR, S1/S2 +, no rubs, no gallops Respiratory: CTA bilaterally, no wheezing, no rhonchi, on 2 L nasal cannula oxygenating on a percent Abdominal: Soft, NT, ND, bowel sounds + Extremities: no edema, no cyanosis    The results of significant diagnostics from this hospitalization (including imaging, microbiology, ancillary and laboratory) are listed below for reference.     Microbiology: Recent Results (from the past 240 hour(s))  SARS CORONAVIRUS 2 (TAT 6-24 HRS) Nasopharyngeal Nasopharyngeal Swab     Status: None   Collection Time: 08/02/19  6:41 PM   Specimen: Nasopharyngeal Swab  Result Value Ref Range Status   SARS Coronavirus 2 NEGATIVE NEGATIVE Final    Comment: (NOTE) SARS-CoV-2 target nucleic acids are NOT DETECTED. The SARS-CoV-2 RNA is generally detectable in upper and lower respiratory specimens during the acute phase of infection. Negative results do not preclude SARS-CoV-2 infection, do not rule out co-infections  with other pathogens, and should not be used as the sole basis for treatment or other patient management decisions. Negative results must be combined with clinical observations, patient history, and epidemiological information. The expected result is Negative. Fact Sheet for Patients: SugarRoll.be Fact Sheet for Healthcare Providers: https://www.woods-mathews.com/ This test is not yet approved or cleared by the [place] FDA and  has been authorized for detection and/or diagnosis of SARS-CoV-2 by  FDA under an Emergency Use Authorization (EUA). This EUA will remain  in effect (meaning this test can be used) for the duration of the COVID-19 declaration under Section 56 4(b)(1) of the Act, 21 U.S.C. section 360bbb-3(b)(1), unless the authorization is terminated or revoked sooner. Performed at [facility], [place] [address], [place], [place] [address]      Labs: BNP (last 3 results) No results for input(s): BNP in the last 8760 hours. Basic Metabolic Panel: Recent Labs  Lab 08/04/19 0119 08/04/19 0507 08/05/19 0459 08/06/19 0546 08/07/19 0027 08/08/19 0521  NA 140 138 136 140 136 137  K 3.0* 3.1* 3.4* 3.5 3.7 3.5  CL 102 102 103 105 102 105  CO2 '29 29 24 27 27 25  ' GLUCOSE 183* 159* 148* 111* 115* 107*  BUN '21 20 10 9 10 9  ' CREATININE 1.39* 1.29* 1.25* 1.44* 1.70* 1.28*  CALCIUM 7.5* 7.4* 7.3* 7.3* 7.5* 7.9*  MG 2.4 3.0* 2.2 1.9 1.9  --    Liver Function Tests: Recent Labs  Lab 08/02/19 1837  AST 21  ALT 12  ALKPHOS 127*  BILITOT 0.6  PROT 7.4  ALBUMIN 3.1*   No results for input(s): LIPASE, AMYLASE in the last 168 hours. No results for input(s): AMMONIA in the last 168 hours. CBC: Recent Labs  Lab 08/04/19 0119 08/04/19 0507  08/05/19 0459  08/06/19 0546  08/07/19 0027 08/07/19 0537 08/07/19 1407 08/07/19 2254 08/08/19 0521  WBC 8.7 8.8  --  9.6  --  8.0  --  9.2  --   --   --   --   HGB 8.3* 7.9*   < >  8.0*   < > 8.6*   < > 9.2*  9.1* 9.4* 8.5* 8.7* 8.6*  HCT 26.9* 25.5*   < > 26.7*   < > 27.6*   < > 29.9*  29.4* 30.3* 27.5* 28.3* 27.6*  MCV 92.4 92.1  --  94.7  --  94.8  --  96.1  --   --   --   --   PLT 182 185  --  215  --  170  --  176  --   --   --   --    < > = values in this interval not displayed.   Cardiac Enzymes: No results for input(s): CKTOTAL, CKMB, CKMBINDEX, TROPONINI in the last 168 hours. BNP: Invalid input(s): POCBNP CBG: Recent Labs  Lab 08/07/19 1623 08/07/19 1947 08/08/19 0000 08/08/19 0419 08/08/19 0742  GLUCAP 118* 120* 150* 117* 158*   D-Dimer No results for input(s): DDIMER in the last 72 hours. Hgb A1c No results for input(s): HGBA1C in the last 72 hours. Lipid Profile No results for input(s): CHOL, HDL, LDLCALC, TRIG, CHOLHDL, LDLDIRECT in the last 72 hours. Thyroid function studies No results for input(s): TSH, T4TOTAL, T3FREE, THYROIDAB in the last 72 hours.  Invalid input(s): FREET3 Anemia work up No results for input(s): VITAMINB12, FOLATE, FERRITIN, TIBC, IRON, RETICCTPCT in the last 72 hours. Urinalysis    Component Value Date/Time   COLORURINE YELLOW 08/15/2018 2201   APPEARANCEUR CLEAR 08/15/2018 2201   LABSPEC <1.005 (L) 08/15/2018 2201   PHURINE 5.5 08/15/2018 2201   GLUCOSEU >=500 (A) 08/15/2018 2201   GLUCOSEU NEGATIVE 11/11/2013 1353   HGBUR NEGATIVE 08/15/2018 2201   BILIRUBINUR NEGATIVE 08/15/2018 2201   BILIRUBINUR neg 10/27/2015 1355   KETONESUR NEGATIVE 08/15/2018 2201   PROTEINUR NEGATIVE 08/15/2018 2201   UROBILINOGEN 0.2 10/27/2015 1355  UROBILINOGEN 0.2 07/29/2014 2300   NITRITE NEGATIVE 08/15/2018 2201   LEUKOCYTESUR NEGATIVE 08/15/2018 2201   Sepsis Labs Invalid input(s): PROCALCITONIN,  WBC,  LACTICIDVEN Microbiology Recent Results (from the past 240 hour(s))  SARS CORONAVIRUS 2 (TAT 6-24 HRS) Nasopharyngeal Nasopharyngeal Swab     Status: None   Collection Time: 08/02/19  6:41 PM   Specimen:  Nasopharyngeal Swab  Result Value Ref Range Status   SARS Coronavirus 2 NEGATIVE NEGATIVE Final    Comment: (NOTE) SARS-CoV-2 target nucleic acids are NOT DETECTED. The SARS-CoV-2 RNA is generally detectable in upper and lower respiratory specimens during the acute phase of infection. Negative results do not preclude SARS-CoV-2 infection, do not rule out co-infections with other pathogens, and should not be used as the sole basis for treatment or other patient management decisions. Negative results must be combined with clinical observations, patient history, and epidemiological information. The expected result is Negative. Fact Sheet for Patients: SugarRoll.be Fact Sheet for Healthcare Providers: https://www.woods-mathews.com/ This test is not yet approved or cleared by the [place] FDA and  has been authorized for detection and/or diagnosis of SARS-CoV-2 by FDA under an Emergency Use Authorization (EUA). This EUA will remain  in effect (meaning this test can be used) for the duration of the COVID-19 declaration under Section 56 4(b)(1) of the Act, 21 U.S.C. section 360bbb-3(b)(1), unless the authorization is terminated or revoked sooner. Performed at [facility], [place] [address] [place], [place] [address]      Time coordinating discharge: Over 30 minutes  SIGNED:   Saniyyah Elster J [place] DO  [facility] 08/08/2019, 11:51 AM

## 2019-08-08 NOTE — Progress Notes (Signed)
PROGRESS NOTE    Sue [name]  [id number] DOB: Dec 12, 1950 DOA: 08/02/2019 PCP: Ann Held, DO    Brief Narrative:   Sue [name] is a 68 year old female with history of recent DVT on Coumadin, chronic hypoxic respiratory failure on 3 L oxygen at baseline, insulin-dependent diabetes mellitus type 2, presented with 2-day history of bright red blood per rectum.  Recently started on Coumadin in June 2020.  Hemoglobin was noted to be 4.6 on admission with an INR of 2.4.  EDP consulted gastroenterology and requested TRH for admission for likely lower GI bleed.   Assessment & Plan:   Principal Problem:   GI bleed Active Problems:   DVT (deep venous thrombosis) (HCC)   Diabetes mellitus type II, uncontrolled (HCC)   Symptomatic anemia   QT prolongation   Lower GI bleed, suspect diverticular Acute blood loss anemia Patient presenting with 2-day history of bright red blood per rectum, complicated by recent diagnosis of DVT currently on Coumadin.  Hemoglobin 4.6 on admission with an INR of 2.4.  Transfused 3 units PRBCs, 1 unit FFP, and received IV vitamin K.  Nuclear med bleeding scan notable for right lower abdomen with concerning for active GI bleed correlating to the sigmoid versus small bowel. Colonoscopy 08/05/2019 with findings of red blood found in the entire colon with scattered small mouth diverticula, internal hemorrhoids, multiple maroon-colored clots left colon with poor visualization and despite repeated irrigation and aspiration cannot pinpoint active bleeding site.  Hemoglobin dropped from 8.0-6.4 following colonoscopy on 08/05/2019, transfuse an additional 2 units PRBCs.  Consulted interventional radiology 08/06/2019 with recommendations of repeat nuclear medicine scan rather than angiogram/embolization at this time.  Reevaluated by GI, holding off on repeat NM scan as it appears bleeding has resolved. --Gastroenterology following, appreciate assistance --Hgb  8.0-->6.4-->6.6-->8.6-->9.1-->9.2-->9.4-->8.7-->8.6 --Patient reports small bowel movement overnight, denies blood, none documented by nursing staff although --Tolerating advance diet, heart healthy --Continue to hold anticoagulation x1 week --Continue to monitor H&H daily and transfuse for hemoglobin < 7.0 --If recurrence of bleeding, stat NM bleeding scan for possible IR guided embolization.  Acute on chronic CKD stage IIIb Baseline creatinine of the past year 1.5-2.5 with GFR roughly 40.  Patient admitted with lower GI bleed with significant anemia, suspicious for ATN versus prerenal. --Creatinine 1.44-->1.70-->1.28 today --s/p transfusion 5 units PRBCs during hospitalization --Hemoglobin appears stable now --Avoid nephrotoxins, renally dose all medications  Type 2 diabetes mellitus, insulin-dependent Hemoglobin A1c, 6.5, well controlled.  Glipizide 5 mg p.o. twice daily, Metformin 1000 mg p.o. twice daily, units subcutaneously daily. --Holding home medications at this time --Continue insulin sliding scale for coverage  Left lower extremity DVT, June 2020, provoked Secondary to fracture and poor mobility.  INR 2.4 on admission, within normal range.  Was reversed with IV vitamin K and FFP secondary to GI bleed as above. --Continue to hold Coumadin given acute GI bleed as above  Emphysema and chronic hypoxic respiratory failure Uses 3 L supplemental oxygen at home 24 hours. Increased to 4 L in the ED. Has worsening dyspnea due to acute blood loss anemia. Lungs clear on auscultation. Chest x-ray negative. --Currently on 3 L nasal cannula, oxygenating 100% --Continue supplemental oxygen, titrate for SPO2 greater than 88%  Insulin-dependent diabetes mellitus Hemoglobin A1c 6.5.  --Continue sliding scale insulin --CBG checks every 4 hours as patient is n.p.o.  Prolonged QTC QTC noted to be 526.  Holding home Seroquel for now.  Depression/anxiety --Continue buspirone 15 mg p.o.  daily, Ativan prn --  Holding home Seroquel secondary to prolonged QTC  Pulmonary nodule CT showing partially visualized 5 mm right lower lobe pulmonary nodule.Patient is a former smoker with a 40-pack-year smoking history. Will need repeat noncontrast chest CT in 12 months for follow-up  Morbid obesity: BMI 52.60, discussed with patient need for aggressive lifestyle changes and weight loss as this complicates all facets of care.   DVT prophylaxis: SCDs, chemical DVT prophylaxis contraindicated in acute GI bleed Code Status: Full code Family Communication: None present at bedside Disposition Plan: Remain inpatient, further depending on clinical course, awaiting further gastroenterology recommendations; plan to discharge home to senior apartment when medically ready   Consultants:   [facility]  Interventional radiology  Procedures:  Colonoscopy 08/05/2019: Impression:                - Preparation of the colon was unsatisfactory. - Preparation of the colon was inadequate. - Blood in the entire examined colon. - Diverticulosis in the sigmoid colon. - Internal hemorrhoids. - The examined portion of the ileum was normal. - No specimens collected.  Recommendation:            - Clear liquid diet. - Miralax to flush the remaining blood and clots from the colonoscopy. If rebleeds then will need an  angiogram with possible emoblization. Continue to hold anticoagulation. - Repeat colonoscopy as an outpt because the bowel preparation was poor.   Antimicrobials:   None   Subjective: Patient seen and examined at bedside, resting comfortably.  About to go work with physical therapy this morning.  Feels much better this morning.  Reports small bowel movement overnight without reported blood, although no documentation per nursing staff.  Tolerating diet.  Hemoglobin remained stable.  No other complaints or concerns at this time.  Denies headache, no dizziness, no chest pain,  no palpitations, no shortness of breath, no nausea/vomiting/diarrhea, no abdominal pain, no weakness, no fatigue, no paresthesias.  No acute events overnight per nursing staff.  Objective: Vitals:   08/07/19 1318 08/07/19 2049 08/08/19 0403 08/08/19 0933  BP: (!) 101/59 135/86 (!) 102/55 127/75  Pulse: 74 80 74 85  Resp:  20 18   Temp: 98.6 F (37 C) 98.8 F (37.1 C) 98.8 F (37.1 C)   TempSrc: Oral Oral Oral   SpO2: 98% 100% 100%   Weight:      Height:        Intake/Output Summary (Last 24 hours) at 08/08/2019 1022 Last data filed at 08/08/2019 0936 Gross per 24 hour  Intake 1263.51 ml  Output 475 ml  Net 788.51 ml   Filed Weights   08/02/19 1656 08/05/19 0835  Weight: 134.7 kg 134.7 kg    Examination:  General exam: Appears calm and comfortable, obese Respiratory system: Clear to auscultation. Respiratory effort normal. Cardiovascular system: S1 & S2 heard, RRR. No JVD, murmurs, rubs, gallops or clicks. No pedal edema. Gastrointestinal system: Abdomen is nondistended, soft and nontender. No organomegaly or masses felt.  Decreased bowel sounds. Central nervous system: Alert and oriented. No focal neurological deficits. Extremities: Symmetric 5 x 5 power. Skin: No rashes, lesions or ulcers Psychiatry: Judgement and insight appear normal. Mood & affect appropriate.     Data Reviewed: I have personally reviewed following labs and imaging studies  CBC: Recent Labs  Lab 08/04/19 0119 08/04/19 0507  08/05/19 0459  08/06/19 0546  08/07/19 0027 08/07/19 0537 08/07/19 1407 08/07/19 2254 08/08/19 0521  WBC 8.7 8.8  --  9.6  --  8.0  --  9.2  --   --   --   --   HGB 8.3* 7.9*   < > 8.0*   < > 8.6*   < > 9.2*  9.1* 9.4* 8.5* 8.7* 8.6*  HCT 26.9* 25.5*   < > 26.7*   < > 27.6*   < > 29.9*  29.4* 30.3* 27.5* 28.3* 27.6*  MCV 92.4 92.1  --  94.7  --  94.8  --  96.1  --   --   --   --   PLT 182 185  --  215  --  170  --  176  --   --   --   --    < > = values in this  interval not displayed.   Basic Metabolic Panel: Recent Labs  Lab 08/04/19 0119 08/04/19 0507 08/05/19 0459 08/06/19 0546 08/07/19 0027 08/08/19 0521  NA 140 138 136 140 136 137  K 3.0* 3.1* 3.4* 3.5 3.7 3.5  CL 102 102 103 105 102 105  CO2 29 29 24 27 27 25   GLUCOSE 183* 159* 148* 111* 115* 107*  BUN 21 20 10 9 10 9   CREATININE 1.39* 1.29* 1.25* 1.44* 1.70* 1.28*  CALCIUM 7.5* 7.4* 7.3* 7.3* 7.5* 7.9*  MG 2.4 3.0* 2.2 1.9 1.9  --    GFR: Estimated Creatinine Clearance: 57.4 mL/min (A) (by C-G formula based on SCr of 1.28 mg/dL (H)). Liver Function Tests: Recent Labs  Lab 08/02/19 1837  AST 21  ALT 12  ALKPHOS 127*  BILITOT 0.6  PROT 7.4  ALBUMIN 3.1*   No results for input(s): LIPASE, AMYLASE in the last 168 hours. No results for input(s): AMMONIA in the last 168 hours. Coagulation Profile: Recent Labs  Lab 08/02/19 1837 08/03/19 1647 08/05/19 0459 08/06/19 0546 08/07/19 0027  INR 2.4* 1.8* 1.5* 1.6* 1.4*   Cardiac Enzymes: No results for input(s): CKTOTAL, CKMB, CKMBINDEX, TROPONINI in the last 168 hours. BNP (last 3 results) No results for input(s): PROBNP in the last 8760 hours. HbA1C: No results for input(s): HGBA1C in the last 72 hours. CBG: Recent Labs  Lab 08/07/19 1623 08/07/19 1947 08/08/19 0000 08/08/19 0419 08/08/19 0742  GLUCAP 118* 120* 150* 117* 158*   Lipid Profile: No results for input(s): CHOL, HDL, LDLCALC, TRIG, CHOLHDL, LDLDIRECT in the last 72 hours. Thyroid Function Tests: No results for input(s): TSH, T4TOTAL, FREET4, T3FREE, THYROIDAB in the last 72 hours. Anemia Panel: No results for input(s): VITAMINB12, FOLATE, FERRITIN, TIBC, IRON, RETICCTPCT in the last 72 hours. Sepsis Labs: No results for input(s): PROCALCITON, LATICACIDVEN in the last 168 hours.  Recent Results (from the past 240 hour(s))  SARS CORONAVIRUS 2 (TAT 6-24 HRS) Nasopharyngeal Nasopharyngeal Swab     Status: None   Collection Time: 08/02/19  6:41 PM    Specimen: Nasopharyngeal Swab  Result Value Ref Range Status   SARS Coronavirus 2 NEGATIVE NEGATIVE Final    Comment: (NOTE) SARS-CoV-2 target nucleic acids are NOT DETECTED. The SARS-CoV-2 RNA is generally detectable in upper and lower respiratory specimens during the acute phase of infection. Negative results do not preclude SARS-CoV-2 infection, do not rule out co-infections with other pathogens, and should not be used as the sole basis for treatment or other patient management decisions. Negative results must be combined with clinical observations, patient history, and epidemiological information. The expected result is Negative. Fact Sheet for Patients: SugarRoll.be Fact Sheet for Healthcare Providers: https://www.woods-mathews.com/ This test is not yet approved or cleared by the  [place] Architectural technologist and  has been authorized for detection and/or diagnosis of SARS-CoV-2 by FDA under an Print production planner (EUA). This EUA will remain  in effect (meaning this test can be used) for the duration of the COVID-19 declaration under Section 56 4(b)(1) of the Act, 21 U.S.C. section 360bbb-3(b)(1), unless the authorization is terminated or revoked sooner. Performed at [facility], [address], [place], [place] [address]          Radiology Studies: No results found.      Scheduled Meds: . sodium chloride   Intravenous Once  . busPIRone  15 mg Oral Daily  . insulin aspart  0-9 Units Subcutaneous Q4H  . levothyroxine  50 mcg Oral Q0600  . lisinopril  20 mg Oral Daily  . metoprolol succinate  100 mg Oral Daily  . polyethylene glycol  17 g Oral TID   Continuous Infusions:    LOS: 6 days    Time spent: 33 minutes spent on chart review, discussion with nursing staff, consultants, updating family and interview/physical exam; more than 50% of that time was spent in counseling and/or coordination of care.     J  [place] DO [facility] 08/08/2019, 10:22 AM

## 2019-08-08 NOTE — Discharge Instructions (Signed)
Lower Gastrointestinal Bleeding °Lower gastrointestinal (GI) bleeding is the result of bleeding from the colon, rectum, or anal area. The colon is the last part of the digestive tract, where stool, also called feces, is formed. If you have lower GI bleeding, you may see blood in or on your stool. It may be bright red. °Lower GI bleeding often stops without treatment. Continued or heavy bleeding needs emergency treatment at the hospital. °What are the causes? °Lower GI bleeding may be caused by: °· A condition that causes pouches to form in the colon over time (diverticulosis). °· Swelling and irritation (inflammation) in areas with diverticulosis (diverticulitis). °· Inflammation of the colon (inflammatory bowel disease). °· Swollen veins in the rectum (hemorrhoids). °· Painful tears in the anus (anal fissures), often caused by passing hard stools. °· Cancer of the colon or rectum. °· Noncancerous growths (polyps) of the colon or rectum. °· A bleeding disorder that impairs the formation of blood clots and causes easy bleeding (coagulopathy). °· An abnormal weakening of a blood vessel where an artery and a vein come together (arteriovenous malformation). °What increases the risk? °You are more likely to develop this condition if: °· You are older than 67 years of age. °· You take aspirin or NSAIDs on a regular basis. °· You take anticoagulant or antiplatelet drugs. °· You have a history of high-dose X-ray treatment (radiation therapy) of the colon. °· You recently had a colon polyp removed. °What are the signs or symptoms? °Symptoms of this condition include: °· Bright red blood or blood clots coming from your rectum. °· Bloody stools. °· Black or maroon-colored stools. °· Pain or cramping in the abdomen. °· Weakness or dizziness. °· Racing heartbeat. °How is this diagnosed? °This condition may be diagnosed based on: °· Your symptoms and medical history. °· A physical exam. During the exam, your health care provider  will check for signs of blood loss, such as low blood pressure and a rapid pulse. °· Tests, such as: °¨ Flexible sigmoidoscopy. In this procedure, a flexible tube with a camera on the end is used to examine your anus and the first part of your colon to look for the source of bleeding. °¨ Colonoscopy. This is similar to a flexible sigmoidoscopy, but the camera can extend all the way to the uppermost part of your colon. °¨ Blood tests to measure your red blood cell count and to check for coagulopathy. °¨ An imaging study of your colon to look for a bleeding site. In some cases, you may have X-rays taken after a dye or radioactive substance is injected into your bloodstream (angiogram). °How is this treated? °Treatment for this condition depends on the cause of the bleeding. Heavy or persistent bleeding is treated at the hospital. Treatment may include: °· Getting fluids through an IV tube inserted into one of your veins. °· Getting blood through an IV tube (blood transfusion). °· Stopping bleeding through high-heat coagulation, injections of certain medicines, or applying surgical clips. This can all be done during a colonoscopy. °· Having a procedure that involves first doing an angiogram and then blocking blood flow to the bleeding site (embolization). °· Stopping some of your regular medicines for a certain amount of time. °· Having surgery to remove part of the colon. This may be needed if bleeding is severe and does not respond to other treatment. °Follow these instructions at home: °· Take over-the-counter and prescription medicines only as told by your health care provider. You may need to avoid   avoid aspirin, NSAIDs, or other medicines that increase bleeding.  Eat foods that are high in fiber. This will help keep your stools soft. These foods include whole grains, legumes, fruits, and vegetables. Eating 1-3 prunes each day works well for many people.  Drink enough fluid to keep your urine clear or pale  yellow.  Keep all follow-up visits as told by your health care provider. This is important. Contact a health care provider if:  Your symptoms do not improve. Get help right away if:  Your bleeding increases.  You feel light-headed or you faint.  You feel weak.  You have severe cramps in your back or abdomen.  You pass large blood clots in your stool.  Your symptoms get worse. This information is not intended to replace advice given to you by your health care provider. Make sure you discuss any questions you have with your health care provider. Document Released: 01/14/2016 Document Revised: 12/21/2018 Document Reviewed: 01/14/2016 Elsevier Patient Education  2020 [employer].

## 2019-08-10 LAB — TYPE AND SCREEN
ABO/RH(D): O POS
ABO/RH(D): O POS
Antibody Screen: NEGATIVE
Antibody Screen: NEGATIVE
Unit division: 0
Unit division: 0
Unit division: 0
Unit division: 0
Unit division: 0
Unit division: 0

## 2019-08-10 LAB — BPAM RBC
Blood Product Expiration Date: 202012212359
Blood Product Expiration Date: 202012222359
Blood Product Expiration Date: 202012222359
Blood Product Expiration Date: 202012272359
Blood Product Expiration Date: 202012272359
Blood Product Expiration Date: 202012272359
ISSUE DATE / TIME: 202011202142
ISSUE DATE / TIME: 202011210501
ISSUE DATE / TIME: 202011211001
ISSUE DATE / TIME: 202011232317
ISSUE DATE / TIME: 202011240226
ISSUE DATE / TIME: 202011240226
Unit Type and Rh: 5100
Unit Type and Rh: 5100
Unit Type and Rh: 5100
Unit Type and Rh: 5100
Unit Type and Rh: 5100
Unit Type and Rh: 5100

## 2019-08-12 ENCOUNTER — Other Ambulatory Visit: Payer: Self-pay

## 2019-08-12 DIAGNOSIS — N1832 Chronic kidney disease, stage 3b: Secondary | ICD-10-CM

## 2019-08-12 NOTE — Patient Outreach (Signed)
[facility]) Care Management  08/12/2019  JOLYNE OXENDINE 1950/10/06 [id number]     Transition of Care Referral  Referral Date: 08/12/2019 Referral Source: [facility] Liaison Date of Admission: 08/02/2019 Diagnosis: "acute blood loss" Date of Discharge: 08/08/2019 Facility: [place]: [facility] *PCP Office Does TOC*   Outreach attempt # 1 to patient. No answer. RN CM left HIPAA compliant voicemail message along with contact info.    Plan: RN CM will make outreach attempt to patient within 3-4 business days. RN CM will send unsuccessful outreach letter to patient.   [name], RN,BSN,CCM [place] Management Telephonic Care Management Coordinator Direct Phone: [phone] Toll Free: [phone] Fax: [phone]

## 2019-08-13 ENCOUNTER — Other Ambulatory Visit: Payer: Self-pay

## 2019-08-13 NOTE — Patient Outreach (Signed)
[facility]) Care Management  08/13/2019  Sue [name] Aug 02, 1951 [id number]    Transition of Care Referral  Referral Date: 08/12/2019 Referral Source: [facility] Liaison Date of Admission: 08/02/2019 Diagnosis: "acute blood loss" Date of Discharge: 08/08/2019 Facility: [place]: [facility] *PCP Office Does TOC*   Outreach attempt #2 to patient. No answer at present.    Plan: RN CM will make outreach attempt to patient within 3-4 business days.    [name], RN,BSN,CCM [place] [facility] Telephonic Care Management Coordinator Direct Phone: [phone] Toll Free: [phone] Fax: [phone]

## 2019-08-14 ENCOUNTER — Telehealth: Payer: Self-pay | Admitting: *Deleted

## 2019-08-14 NOTE — Telephone Encounter (Signed)
LVM for pt to call office

## 2019-08-15 ENCOUNTER — Other Ambulatory Visit: Payer: Self-pay

## 2019-08-15 NOTE — Telephone Encounter (Signed)
LVM. 2nd attempt.

## 2019-08-15 NOTE — Patient Outreach (Signed)
[facility]) Care Management  08/15/2019  Sue [name] 1951-03-17 [id number]   Transition of Care Referral  Referral Date:08/12/2019 Referral [facility] Liaison Date of Admission:08/02/2019 Diagnosis:"acute blood loss" Date of Discharge:08/08/2019 [place] [employer] *PCP Office Does TOC*   Outreach attempt #3 to patient. No answer at present and unable to leave message.     Plan: RN CM will close case if no response from letter mailed to patient.   [name], RN,BSN,CCM [place] [facility] Telephonic Care Management Coordinator Direct Phone: [phone] Toll Free: [phone] Fax: [phone]

## 2019-08-16 ENCOUNTER — Other Ambulatory Visit: Payer: Self-pay

## 2019-08-16 ENCOUNTER — Inpatient Hospital Stay (HOSPITAL_COMMUNITY)
Admission: EM | Admit: 2019-08-16 | Discharge: 2019-08-19 | DRG: 378 | Disposition: A | Discharge status: Home / Self Care | Payer: Medicare HMO | Attending: Internal Medicine | Admitting: Internal Medicine

## 2019-08-16 ENCOUNTER — Encounter (HOSPITAL_COMMUNITY): Payer: Self-pay

## 2019-08-16 DIAGNOSIS — I129 Hypertensive chronic kidney disease with stage 1 through stage 4 chronic kidney disease, or unspecified chronic kidney disease: Secondary | ICD-10-CM | POA: Diagnosis not present

## 2019-08-16 DIAGNOSIS — F329 Major depressive disorder, single episode, unspecified: Secondary | ICD-10-CM | POA: Diagnosis present

## 2019-08-16 DIAGNOSIS — J439 Emphysema, unspecified: Secondary | ICD-10-CM | POA: Diagnosis present

## 2019-08-16 DIAGNOSIS — Z20828 Contact with and (suspected) exposure to other viral communicable diseases: Secondary | ICD-10-CM | POA: Diagnosis present

## 2019-08-16 DIAGNOSIS — Z86718 Personal history of other venous thrombosis and embolism: Secondary | ICD-10-CM | POA: Diagnosis not present

## 2019-08-16 DIAGNOSIS — Z9981 Dependence on supplemental oxygen: Secondary | ICD-10-CM | POA: Diagnosis not present

## 2019-08-16 DIAGNOSIS — E1122 Type 2 diabetes mellitus with diabetic chronic kidney disease: Secondary | ICD-10-CM | POA: Diagnosis present

## 2019-08-16 DIAGNOSIS — N1832 Chronic kidney disease, stage 3b: Secondary | ICD-10-CM | POA: Diagnosis present

## 2019-08-16 DIAGNOSIS — E039 Hypothyroidism, unspecified: Secondary | ICD-10-CM | POA: Diagnosis present

## 2019-08-16 DIAGNOSIS — Z825 Family history of asthma and other chronic lower respiratory diseases: Secondary | ICD-10-CM | POA: Diagnosis not present

## 2019-08-16 DIAGNOSIS — K922 Gastrointestinal hemorrhage, unspecified: Secondary | ICD-10-CM | POA: Diagnosis present

## 2019-08-16 DIAGNOSIS — N189 Chronic kidney disease, unspecified: Secondary | ICD-10-CM | POA: Diagnosis not present

## 2019-08-16 DIAGNOSIS — Z7901 Long term (current) use of anticoagulants: Secondary | ICD-10-CM | POA: Diagnosis not present

## 2019-08-16 DIAGNOSIS — J9611 Chronic respiratory failure with hypoxia: Secondary | ICD-10-CM | POA: Diagnosis present

## 2019-08-16 DIAGNOSIS — I5032 Chronic diastolic (congestive) heart failure: Secondary | ICD-10-CM | POA: Diagnosis present

## 2019-08-16 DIAGNOSIS — Z79890 Hormone replacement therapy (postmenopausal): Secondary | ICD-10-CM

## 2019-08-16 DIAGNOSIS — R911 Solitary pulmonary nodule: Secondary | ICD-10-CM | POA: Diagnosis present

## 2019-08-16 DIAGNOSIS — K921 Melena: Secondary | ICD-10-CM | POA: Diagnosis present

## 2019-08-16 DIAGNOSIS — D62 Acute posthemorrhagic anemia: Secondary | ICD-10-CM | POA: Diagnosis present

## 2019-08-16 DIAGNOSIS — F419 Anxiety disorder, unspecified: Secondary | ICD-10-CM | POA: Diagnosis present

## 2019-08-16 DIAGNOSIS — Z8249 Family history of ischemic heart disease and other diseases of the circulatory system: Secondary | ICD-10-CM | POA: Diagnosis not present

## 2019-08-16 DIAGNOSIS — Z7984 Long term (current) use of oral hypoglycemic drugs: Secondary | ICD-10-CM | POA: Diagnosis not present

## 2019-08-16 DIAGNOSIS — K5730 Diverticulosis of large intestine without perforation or abscess without bleeding: Secondary | ICD-10-CM | POA: Diagnosis not present

## 2019-08-16 DIAGNOSIS — K5731 Diverticulosis of large intestine without perforation or abscess with bleeding: Principal | ICD-10-CM | POA: Diagnosis present

## 2019-08-16 DIAGNOSIS — Z6841 Body Mass Index (BMI) 40.0 and over, adult: Secondary | ICD-10-CM | POA: Diagnosis not present

## 2019-08-16 DIAGNOSIS — K625 Hemorrhage of anus and rectum: Secondary | ICD-10-CM

## 2019-08-16 DIAGNOSIS — I130 Hypertensive heart and chronic kidney disease with heart failure and stage 1 through stage 4 chronic kidney disease, or unspecified chronic kidney disease: Secondary | ICD-10-CM | POA: Diagnosis present

## 2019-08-16 DIAGNOSIS — Z8349 Family history of other endocrine, nutritional and metabolic diseases: Secondary | ICD-10-CM

## 2019-08-16 DIAGNOSIS — Z87891 Personal history of nicotine dependence: Secondary | ICD-10-CM | POA: Diagnosis not present

## 2019-08-16 DIAGNOSIS — Z8673 Personal history of transient ischemic attack (TIA), and cerebral infarction without residual deficits: Secondary | ICD-10-CM | POA: Diagnosis not present

## 2019-08-16 DIAGNOSIS — Z79899 Other long term (current) drug therapy: Secondary | ICD-10-CM

## 2019-08-16 DIAGNOSIS — Z794 Long term (current) use of insulin: Secondary | ICD-10-CM

## 2019-08-16 DIAGNOSIS — Z833 Family history of diabetes mellitus: Secondary | ICD-10-CM

## 2019-08-16 DIAGNOSIS — R531 Weakness: Secondary | ICD-10-CM | POA: Diagnosis not present

## 2019-08-16 LAB — COMPREHENSIVE METABOLIC PANEL
ALT: 13 U/L (ref 0–44)
AST: 27 U/L (ref 15–41)
Albumin: 3.2 g/dL — ABNORMAL LOW (ref 3.5–5.0)
Alkaline Phosphatase: 115 U/L (ref 38–126)
Anion gap: 17 — ABNORMAL HIGH (ref 5–15)
BUN: 18 mg/dL (ref 8–23)
CO2: 29 mmol/L (ref 22–32)
Calcium: 7 mg/dL — ABNORMAL LOW (ref 8.9–10.3)
Chloride: 95 mmol/L — ABNORMAL LOW (ref 98–111)
Creatinine, Ser: 1.87 mg/dL — ABNORMAL HIGH (ref 0.44–1.00)
GFR calc Af Amer: 32 mL/min — ABNORMAL LOW (ref >60)
GFR calc non Af Amer: 27 mL/min — ABNORMAL LOW (ref >60)
Glucose, Bld: 127 mg/dL — ABNORMAL HIGH (ref 70–99)
Potassium: 3 mmol/L — ABNORMAL LOW (ref 3.5–5.1)
Sodium: 141 mmol/L (ref 135–145)
Total Bilirubin: 0.8 mg/dL (ref 0.3–1.2)
Total Protein: 8.2 g/dL — ABNORMAL HIGH (ref 6.5–8.1)

## 2019-08-16 LAB — SARS CORONAVIRUS 2 (TAT 6-24 HRS): SARS Coronavirus 2: NEGATIVE

## 2019-08-16 LAB — CBC
HCT: 29.5 % — ABNORMAL LOW (ref 36.0–46.0)
Hemoglobin: 9 g/dL — ABNORMAL LOW (ref 12.0–15.0)
MCH: 28.5 pg (ref 26.0–34.0)
MCHC: 30.5 g/dL (ref 30.0–36.0)
MCV: 93.4 fL (ref 80.0–100.0)
Platelets: 300 10*3/uL (ref 150–400)
RBC: 3.16 MIL/uL — ABNORMAL LOW (ref 3.87–5.11)
RDW: 14.6 % (ref 11.5–15.5)
WBC: 9.6 10*3/uL (ref 4.0–10.5)
nRBC: 0 % (ref 0.0–0.2)

## 2019-08-16 LAB — PROTIME-INR
INR: 1 (ref 0.8–1.2)
Prothrombin Time: 12.7 seconds (ref 11.4–15.2)

## 2019-08-16 LAB — GLUCOSE, CAPILLARY: Glucose-Capillary: 225 mg/dL — ABNORMAL HIGH (ref 70–99)

## 2019-08-16 LAB — POC OCCULT BLOOD, ED: Fecal Occult Bld: POSITIVE — AB

## 2019-08-16 MED ORDER — LORAZEPAM 1 MG PO TABS: 1.5000 mg | ORAL_TABLET | Freq: Every evening | ORAL | Status: DC | PRN

## 2019-08-16 MED ORDER — MORPHINE SULFATE (PF) 2 MG/ML IV SOLN
2.0000 mg | INTRAVENOUS | Status: DC | PRN
  Administered 2019-08-16 – 2019-08-19 (×7): 2 mg via INTRAVENOUS
  Filled 2019-08-16 (×7): qty 1

## 2019-08-16 MED ORDER — ACETAMINOPHEN 325 MG PO TABS: 650.0000 mg | ORAL_TABLET | Freq: Four times a day (QID) | ORAL | Status: DC | PRN

## 2019-08-16 MED ORDER — METOPROLOL SUCCINATE ER 50 MG PO TB24
100.0000 mg | ORAL_TABLET | Freq: Every day | ORAL | Status: DC
  Administered 2019-08-17 – 2019-08-18 (×2): 100 mg via ORAL
  Filled 2019-08-16 (×3): qty 1

## 2019-08-16 MED ORDER — LORAZEPAM 0.5 MG PO TABS
0.5000 mg | ORAL_TABLET | Freq: Every day | ORAL | Status: DC | PRN
  Administered 2019-08-17: 0.5 mg via ORAL
  Filled 2019-08-16: qty 1

## 2019-08-16 MED ORDER — TORSEMIDE 20 MG PO TABS
40.0000 mg | ORAL_TABLET | Freq: Every day | ORAL | Status: DC
  Administered 2019-08-16 – 2019-08-18 (×3): 40 mg via ORAL
  Filled 2019-08-16 (×5): qty 2

## 2019-08-16 MED ORDER — INSULIN ASPART 100 UNIT/ML ~~LOC~~ SOLN
0.0000 [IU] | Freq: Three times a day (TID) | SUBCUTANEOUS | Status: DC
  Administered 2019-08-17: 5 [IU] via SUBCUTANEOUS
  Administered 2019-08-17 – 2019-08-18 (×2): 3 [IU] via SUBCUTANEOUS
  Administered 2019-08-18: 2 [IU] via SUBCUTANEOUS
  Administered 2019-08-18: 3 [IU] via SUBCUTANEOUS

## 2019-08-16 MED ORDER — POTASSIUM CHLORIDE 10 MEQ/100ML IV SOLN
10.0000 meq | Freq: Once | INTRAVENOUS | Status: AC
  Administered 2019-08-16: 10 meq via INTRAVENOUS
  Filled 2019-08-16: qty 100

## 2019-08-16 MED ORDER — BUSPIRONE HCL 5 MG PO TABS
15.0000 mg | ORAL_TABLET | Freq: Every day | ORAL | Status: DC
  Administered 2019-08-16 – 2019-08-18 (×3): 15 mg via ORAL
  Filled 2019-08-16 (×3): qty 1

## 2019-08-16 MED ORDER — POTASSIUM CHLORIDE 20 MEQ PO PACK
40.0000 meq | PACK | ORAL | Status: AC
  Administered 2019-08-16 (×2): 40 meq via ORAL
  Filled 2019-08-16 (×2): qty 2

## 2019-08-16 MED ORDER — LEVOTHYROXINE SODIUM 50 MCG PO TABS
50.0000 ug | ORAL_TABLET | Freq: Every day | ORAL | Status: DC
  Administered 2019-08-16 – 2019-08-19 (×3): 50 ug via ORAL
  Filled 2019-08-16 (×4): qty 1

## 2019-08-16 MED ORDER — ZOLPIDEM TARTRATE 5 MG PO TABS
5.0000 mg | ORAL_TABLET | Freq: Every day | ORAL | Status: DC
  Administered 2019-08-16 – 2019-08-18 (×3): 5 mg via ORAL
  Filled 2019-08-16 (×3): qty 1

## 2019-08-16 MED ORDER — QUETIAPINE FUMARATE 100 MG PO TABS
100.0000 mg | ORAL_TABLET | Freq: Every day | ORAL | Status: DC
  Administered 2019-08-16 – 2019-08-18 (×3): 100 mg via ORAL
  Filled 2019-08-16 (×3): qty 1

## 2019-08-16 MED ORDER — ALBUTEROL SULFATE (2.5 MG/3ML) 0.083% IN NEBU: 2.5000 mg | INHALATION_SOLUTION | Freq: Four times a day (QID) | RESPIRATORY_TRACT | Status: DC | PRN

## 2019-08-16 MED ORDER — INSULIN ASPART 100 UNIT/ML ~~LOC~~ SOLN
0.0000 [IU] | Freq: Every day | SUBCUTANEOUS | Status: DC
  Administered 2019-08-16: 2 [IU] via SUBCUTANEOUS

## 2019-08-16 MED ORDER — LORAZEPAM 0.5 MG PO TABS: 0.5000 mg | ORAL_TABLET | ORAL | Status: DC

## 2019-08-16 NOTE — ED Notes (Signed)
Assisted provider with rectal exam

## 2019-08-16 NOTE — Telephone Encounter (Signed)
Unable to reach pt. 3rd attempt. Pt has in-person hospital follow up scheduled Monday, 08/19/19.

## 2019-08-16 NOTE — ED Provider Notes (Signed)
Hospitalist service is aware of case and will evaluate for admission.   Valarie Merino, MD 08/16/19 [phone]

## 2019-08-16 NOTE — H&P (Signed)
History and Physical    Sue [name] GUY:[id number] DOB: [birth year] DOA: 08/16/2019  PCP: Ann Held, DO   Patient coming from: Home    Chief Complaint: Hematochezia  HPI: Sue [name] is a 69 y.o. female with medical history significant of DVT on Coumadin, chronic hypoxic respiratory failure on 3 L of oxygen at baseline, COPD, insulin-dependent diabetes mellitus, morbid obesity, CKD stage IIIb depression, anxiety who presented from her home today to the emergency department with complaints of  maroon-colored stools, passage of clots from her rectum.  She was just discharged here on 08/08/2019 when she was admitted for the evaluation of same problem.  At that time her hemoglobin was in the range of 4.6, she was transfused with a unit of PRBC, monitor therapy, given IV vitamin K.  RBC tagged scan was significant for active GI bleed in the right lower abdomen and she underwent colonoscopy with finding of red blood in the entire colon but could not find the source of active bleeding.   She has not taken any Coumadin since discharge. Patient was doing okay at home but since yesterday she started having maroon-colored stools, some clots on the toilet bowel and noticed blood on wiping.  Also complained of some lightheadedness and generalized weakness.  She then presented to the emergency department. Patient seen and examined the bedside this afternoon in the emergency department.  Currently she is hemodynamically stable.  Mildly hypertensive.  Denies any nausea, vomiting or abdominal pain, fever, chills, cough, sore throat, headache .   ED Course: Hemoglobin found to be in the range of  9.  No active bleeding at present.  GI consulted.  Being admitted under observation.  Review of Systems: As per HPI otherwise 10 point review of systems negative.    Past Medical History:  Diagnosis Date   Allergic rhinitis    Depression    Emphysema of lung ([place])    3L home O2   GERD  (gastroesophageal reflux disease)    Hypertension    Hypothyroidism    Obesity, morbid, BMI 50 or higher ([place])    Stroke ([place]) 2016   TIA    Urine incontinence     Past Surgical History:  Procedure Laterality Date   ABDOMINAL HYSTERECTOMY     CESAREAN SECTION     COLONOSCOPY WITH PROPOFOL N/A 08/05/2019   Procedure: COLONOSCOPY WITH PROPOFOL;  Surgeon: Wilford Corner, MD;  Location: [facility];  Service: Gastroenterology;  Laterality: N/A;     reports that she quit smoking about 7 years ago. Her smoking use included cigarettes. She started smoking about 47 years ago. She has a 40.00 pack-year smoking history. She has never used smokeless tobacco. She reports previous alcohol use. She reports that she does not use drugs.  Allergies  Allergen Reactions   Hydrocodone Other (See Comments)    Constipation, hallucinations   Norvasc [Amlodipine Besylate]     Marked swelling    Family History  Problem Relation Age of Onset   Heart disease Father        MVP and Pics Valve   Hypertension Father    Depression Father        Institutionalized x's 2 years   Bipolar disorder Father    Hypertension Sister    Diabetes Sister    Hyperlipidemia Sister    Heart disease Sister 69       MI   Heart disease Brother    Hypertension Brother    Heart disease  Paternal Grandmother    Heart disease Paternal Aunt    Heart disease Paternal Uncle    Schizophrenia Paternal 37    Asthma Son    Asthma Son      Prior to Admission medications   Medication Sig Start Date End Date Taking? Authorizing Provider  Accu-Chek FastClix Lancets MISC USE TO CHECK BLOOD SUGAR UP TO FOUR TIMES DAILY AS DIRECTED 07/15/19   Carollee Herter, Kendrick Fries R, DO  ACCU-CHEK GUIDE test strip USE TO CHECK BLOOD SUGAR UP TO FOUR TIMES DAILY 05/28/19   Carollee Herter, Alferd Apa, DO  albuterol (PROVENTIL HFA;VENTOLIN HFA) 108 (90 Base) MCG/ACT inhaler Inhale 1-2 puffs into the lungs every 6 (six) hours as  needed for wheezing or shortness of breath.    [provider]  albuterol (PROVENTIL) (2.5 MG/3ML) 0.083% nebulizer solution Take 3 mLs (2.5 mg total) by nebulization every 6 (six) hours as needed for wheezing or shortness of breath. 03/31/17   Carollee Herter, Alferd Apa, DO  blood glucose meter kit and supplies KIT Dispense based on patient and insurance preference. Use up to four times daily as directed. (FOR ICD-9 250.00, 250.01). 10/04/18   Carollee Herter, Alferd Apa, DO  busPIRone (BUSPAR) 15 MG tablet Take 15 mg by mouth daily.  12/27/17   [provider]  famotidine (PEPCID) 20 MG tablet Take 20 mg by mouth daily as needed for heartburn or indigestion.    [provider]  fluticasone (FLONASE) 50 MCG/ACT nasal spray Place 2 sprays into both nostrils daily as needed for allergies. 05/02/17   Johnson, Clanford L, MD  glipiZIDE (GLUCOTROL) 5 MG tablet Take 1 tablet (5 mg total) by mouth 2 (two) times daily before a meal. 05/29/19   Carollee Herter, Alferd Apa, DO  insulin glargine (LANTUS) 100 UNIT/ML injection Inject 0.3 mLs (30 Units total) into the skin at bedtime. 12/06/18   Ann Held, DO  Insulin Syringe-Needle U-100 28G X 5/16" 0.5 ML MISC Use with lantus once a day 12/06/18   Carollee Herter, Alferd Apa, DO  levothyroxine (SYNTHROID) 50 MCG tablet Take 1 tablet by mouth once daily 07/01/19   Carollee Herter, Alferd Apa, DO  lisinopril (ZESTRIL) 20 MG tablet Take 1 tablet by mouth once daily Patient taking differently: Take 20 mg by mouth daily.  05/03/19   Roma Schanz R, DO  LORazepam (ATIVAN) 1 MG tablet Take 0.5-1.5 mg by mouth See admin instructions. Take 0.51m by mouth in the morning and 1.510mby mouth at bedtime as needed for anxiety.    [provider]  metFORMIN (GLUCOPHAGE) 500 MG tablet Take 2 tablets (1,000 mg total) by mouth 2 (two) times daily with a meal. 08/18/18 08/18/19  Danford, ChSuann LarryMD  metoprolol succinate (TOPROL-XL) 50 MG 24 hr tablet TAKE 2  TABLETS BY MOUTH ONCE DAILY IN THE MORNING WITH A MEAL OR IMMEDIATELY FOLLOWING A MEAL Patient taking differently: Take 100 mg by mouth daily.  01/03/19   LoAnn HeldDO  NONFORMULARY OR COMPOUNDED ITEM Pt/ inr   Dx dvt   Tomorrow 04/05/2019  Please call office [phone] results 04/04/19   LoCarollee HerterYvAlferd ApaDO  NONFORMULARY OR COMPOUNDED ITEM PT/ inr    Dx dvt 04/08/19   LoCarollee HerterYvAlferd ApaDO  NONFORMULARY OR COMPOUNDED ITEM Compression stockings  20-30 mm/hg  #1   Dx low ext edema 05/28/19   LoCarollee HerterYvAlferd ApaDO  OXYGEN Inhale 3 L into the lungs  continuous.     [provider]  potassium chloride SA (KLOR-CON) 20 MEQ tablet Take 1 tablet by mouth once daily 06/20/19   Carollee Herter, Alferd Apa, DO  QUEtiapine (SEROQUEL) 100 MG tablet Take 100 mg by mouth at bedtime. 02/23/19   [provider]  torsemide (DEMADEX) 20 MG tablet Take 2 tablets by mouth once daily 07/15/19   Carollee Herter, Alferd Apa, DO  warfarin (COUMADIN) 2.5 MG tablet Take 1 tablet (2.5 mg total) by mouth daily. 08/15/19   [place] Donnamarie Poag, DO  warfarin (COUMADIN) 5 MG tablet Take 0.5 tablets (2.5 mg total) by mouth daily at 6 PM. 08/08/19 09/07/19  [place] Donnamarie Poag, DO  zolpidem (AMBIEN) 5 MG tablet Take 2 tablets (10 mg total) by mouth at bedtime. Patient taking differently: Take 5 mg by mouth at bedtime.  05/02/17   Murlean Iba, MD    Physical Exam: Vitals:   08/16/19 1500 08/16/19 1515 08/16/19 1530 08/16/19 1541  BP: 139/87  (!) 159/91   Pulse: 81 81 82 82  Resp: (!) '28 17 20 ' (!) 29  Temp:      TempSrc:      SpO2: 95% 92% 96% 96%  Weight:      Height:        Constitutional: Pleasant female, morbidly obese Vitals:   08/16/19 1500 08/16/19 1515 08/16/19 1530 08/16/19 1541  BP: 139/87  (!) 159/91   Pulse: 81 81 82 82  Resp: (!) '28 17 20 ' (!) 29  Temp:      TempSrc:      SpO2: 95% 92% 96% 96%  Weight:      Height:       Eyes: PERRL, lids and conjunctivae normal ENMT: Dry  mouth.  Neck: normal, supple, no masses, no thyromegaly Respiratory: Very mild crackles on lung bases, normal respiratory effort. No accessory muscle use.  Cardiovascular: Regular rate and rhythm, no murmurs / rubs / gallops.  2-3+ edema in bilateral lower extremities Abdomen: no tenderness, no masses palpated. No hepatosplenomegaly. Bowel sounds positive.  Musculoskeletal: no clubbing / cyanosis. No joint deformity upper and lower extremities. Good ROM, no contractures. Normal muscle tone.  Skin: no rashes, lesions, ulcers. No induration Neurologic: CN 2-12 grossly intact. Sensation intact, DTR normal. Strength 5/5 in all 4.  Psychiatric: Normal judgment and insight. Alert and oriented x 3. Normal mood.   Foley Catheter:None  Labs on Admission: I have personally reviewed following labs and imaging studies  CBC: Recent Labs  Lab 08/16/19 0951  WBC 9.6  HGB 9.0*  HCT 29.5*  MCV 93.4  PLT 010   Basic Metabolic Panel: Recent Labs  Lab 08/16/19 0951  NA 141  K 3.0*  CL 95*  CO2 29  GLUCOSE 127*  BUN 18  CREATININE 1.87*  CALCIUM 7.0*   GFR: Estimated Creatinine Clearance: 37.6 mL/min (A) (by C-G formula based on SCr of 1.87 mg/dL (H)). Liver Function Tests: Recent Labs  Lab 08/16/19 0951  AST 27  ALT 13  ALKPHOS 115  BILITOT 0.8  PROT 8.2*  ALBUMIN 3.2*   No results for input(s): LIPASE, AMYLASE in the last 168 hours. No results for input(s): AMMONIA in the last 168 hours. Coagulation Profile: Recent Labs  Lab 08/16/19 0951  INR 1.0   Cardiac Enzymes: No results for input(s): CKTOTAL, CKMB, CKMBINDEX, TROPONINI in the last 168 hours. BNP (last 3 results) No results for input(s): PROBNP in the last 8760 hours. HbA1C: No results for input(s): HGBA1C  in the last 72 hours. CBG: No results for input(s): GLUCAP in the last 168 hours. Lipid Profile: No results for input(s): CHOL, HDL, LDLCALC, TRIG, CHOLHDL, LDLDIRECT in the last 72 hours. Thyroid Function  Tests: No results for input(s): TSH, T4TOTAL, FREET4, T3FREE, THYROIDAB in the last 72 hours. Anemia Panel: No results for input(s): VITAMINB12, FOLATE, FERRITIN, TIBC, IRON, RETICCTPCT in the last 72 hours. Urine analysis:    Component Value Date/Time   COLORURINE YELLOW 08/15/2018 2201   APPEARANCEUR CLEAR 08/15/2018 2201   LABSPEC <1.005 (L) 08/15/2018 2201   PHURINE 5.5 08/15/2018 2201   GLUCOSEU >=500 (A) 08/15/2018 2201   GLUCOSEU NEGATIVE 11/11/2013 1353   HGBUR NEGATIVE 08/15/2018 2201   BILIRUBINUR NEGATIVE 08/15/2018 2201   BILIRUBINUR neg 10/27/2015 1355   KETONESUR NEGATIVE 08/15/2018 2201   PROTEINUR NEGATIVE 08/15/2018 2201   UROBILINOGEN 0.2 10/27/2015 1355   UROBILINOGEN 0.2 07/29/2014 2300   NITRITE NEGATIVE 08/15/2018 2201   LEUKOCYTESUR NEGATIVE 08/15/2018 2201    Radiological Exams on Admission: No results found.   Assessment/Plan Active Problems:   Lower GI bleed   Hematochezia: Presented with maroon-colored stools, clots, blood on wipes since yesterday.  Denies any abdominal pain, nausea or vomiting.  Has not taken any Coumadin since she was discharged. GI consulted.  Her hemoglobin is stable.  She is not having any active bleeding at present.  Hemoglobin is better than last time at 9. We will continue to monitor H&H very closely.  If she bleeds actively, will consult IR for embolization.  Will start on clear liquid diet starting from this evening. Colonoscopy on 08/05/2019 showed blood found in the entire colon with scattered small mouth diverticula, internal hemorrhoids, maroon-colored clots, poor visualization without finding of any active bleeding site.  She was transfused with a total of 5 units during her hospitalization.  When she was discharged, her hemoglobin is stable at 8.6.  Recommended to hold Coumadin for 7 days. I do not think she needs to continue Coumadin anymore.  She developed DVT which was provoked due to her fracture of the left leg and  it was started on June 2020.  CKD stage IIIb: Baseline creatinine fluctuating 1.5-2.5.  Today creatinine is in the range of 1.8.  It was 1.28 on discharge.  Patient has significant bilateral lower extremity edema.  I will continue torsemide and continue to monitor her kidney function.  She follows with her PCP.  Chronic hypoxic respiratory failure/history of COPD: Follows with pulmonology.  Uses 3 L of oxygen at home 24 hours.  Respiratory status stable at present.  Not in exacerbation.  Continue bronchodilators as needed.  Chronic diastolic CHF: Takes torsemide 40 mg daily at home.  Has bilateral lower extremity edema.  Looks mildly volume overloaded.  Has fine crackles on lung bases.  Echocardiogram done on 03/2017 showed his ejection residual 11%, grade 1 diastolic dysfunction.  Will resume torsemide  Depression/anxiety: Continue buspirone, Ativan as needed, Seroquel  Pulmonary nodule: History of 5 mm right lower lobe pulmonary nodule.  Former smoker with a 40-year pack smoking history.  Repeat noncontrast chest CT in 12 months for follow-up.  Left lower extremity DVT: Provoked.  On June 2020.  Has been on Coumadin since then.  Will discontinue Coumadin on discharge  Morbid obesity: BMI of 48.89.  Severity of Illness: The appropriate patient status for this patient is OBSERVATION.    DVT prophylaxis: SCD Code Status: Full Family Communication: None at the bedside Consults called: [facility]  Shelly Coss MD [facility] Pager [phone]  If 7PM-7AM, please contact night-coverage [email or web address] Password [email or web address]  08/16/2019, 4:10 PM

## 2019-08-16 NOTE — Consult Note (Signed)
[facility] Consultation Note  Referring Provider: [facility] Primary Care Physician:  Carollee Herter, Alferd Apa, DO  Reason for Consultation:  hematochezia  HPI: Sue [name] is a 68 y.o. female with multiple medical problems.  Recently discharged with hematochezia, tagged RBC study positive but no clear site identified on colonoscopy.  She was on warfarin but has not restarted it since she was discharged most recently from the hospital.  Yesterday, patient began having periodic bouts of marroon stools and red stools.  Some lower abdominal crampy discomfort.  No hematemesis.     Past Medical History:  Diagnosis Date  . Allergic rhinitis   . Depression   . Emphysema of lung ([place])    3L home O2  . GERD (gastroesophageal reflux disease)   . Hypertension   . Hypothyroidism   . Obesity, morbid, BMI 50 or higher ([place])   . Stroke [facility]) 2016   TIA   . Urine incontinence     Past Surgical History:  Procedure Laterality Date  . ABDOMINAL HYSTERECTOMY    . CESAREAN SECTION    . COLONOSCOPY WITH PROPOFOL N/A 08/05/2019   Procedure: COLONOSCOPY WITH PROPOFOL;  Surgeon: Wilford Corner, MD;  Location: [facility];  Service: Gastroenterology;  Laterality: N/A;    Prior to Admission medications   Medication Sig Start Date End Date Taking? Authorizing Provider  Accu-Chek FastClix Lancets MISC USE TO CHECK BLOOD SUGAR UP TO FOUR TIMES DAILY AS DIRECTED 07/15/19   Carollee Herter, Kendrick Fries R, DO  ACCU-CHEK GUIDE test strip USE TO CHECK BLOOD SUGAR UP TO FOUR TIMES DAILY 05/28/19   Carollee Herter, Alferd Apa, DO  albuterol (PROVENTIL HFA;VENTOLIN HFA) 108 (90 Base) MCG/ACT inhaler Inhale 1-2 puffs into the lungs every 6 (six) hours as needed for wheezing or shortness of breath.    [provider]  albuterol (PROVENTIL) (2.5 MG/3ML) 0.083% nebulizer solution Take 3 mLs (2.5 mg total) by nebulization every 6 (six) hours as needed for wheezing or shortness of breath. 03/31/17   Carollee Herter, Alferd Apa, DO  blood glucose meter kit and supplies KIT Dispense based on patient and insurance preference. Use up to four times daily as directed. (FOR ICD-9 250.00, 250.01). 10/04/18   Carollee Herter, Alferd Apa, DO  busPIRone (BUSPAR) 15 MG tablet Take 15 mg by mouth daily.  12/27/17   [provider]  famotidine (PEPCID) 20 MG tablet Take 20 mg by mouth daily as needed for heartburn or indigestion.    [provider]  fluticasone (FLONASE) 50 MCG/ACT nasal spray Place 2 sprays into both nostrils daily as needed for allergies. 05/02/17   Johnson, Clanford L, MD  glipiZIDE (GLUCOTROL) 5 MG tablet Take 1 tablet (5 mg total) by mouth 2 (two) times daily before a meal. 05/29/19   Carollee Herter, Alferd Apa, DO  insulin glargine (LANTUS) 100 UNIT/ML injection Inject 0.3 mLs (30 Units total) into the skin at bedtime. 12/06/18   Ann Held, DO  Insulin Syringe-Needle U-100 28G X 5/16" 0.5 ML MISC Use with lantus once a day 12/06/18   Carollee Herter, Alferd Apa, DO  levothyroxine (SYNTHROID) 50 MCG tablet Take 1 tablet by mouth once daily 07/01/19   Carollee Herter, Alferd Apa, DO  lisinopril (ZESTRIL) 20 MG tablet Take 1 tablet by mouth once daily Patient taking differently: Take 20 mg by mouth daily.  05/03/19   Roma Schanz R, DO  LORazepam (ATIVAN) 1 MG tablet Take 0.5-1.5 mg by mouth See admin instructions.  Take 0.56m by mouth in the morning and 1.541mby mouth at bedtime as needed for anxiety.    [provider]  metFORMIN (GLUCOPHAGE) 500 MG tablet Take 2 tablets (1,000 mg total) by mouth 2 (two) times daily with a meal. 08/18/18 08/18/19  Danford, ChSuann LarryMD  metoprolol succinate (TOPROL-XL) 50 MG 24 hr tablet TAKE 2 TABLETS BY MOUTH ONCE DAILY IN THE MORNING WITH A MEAL OR IMMEDIATELY FOLLOWING A MEAL Patient taking differently: Take 100 mg by mouth daily.  01/03/19   LoAnn HeldDO  NONFORMULARY OR COMPOUNDED ITEM Pt/ inr   Dx dvt   Tomorrow 04/05/2019  Please  call office [phone] results 04/04/19   LoCarollee HerterYvAlferd ApaDO  NONFORMULARY OR COMPOUNDED ITEM PT/ inr    Dx dvt 04/08/19   LoCarollee HerterYvAlferd ApaDO  NONFORMULARY OR COMPOUNDED ITEM Compression stockings  20-30 mm/hg  #1   Dx low ext edema 05/28/19   LoCarollee HerterYvAlferd ApaDO  OXYGEN Inhale 3 L into the lungs continuous.     [provider]  potassium chloride SA (KLOR-CON) 20 MEQ tablet Take 1 tablet by mouth once daily 06/20/19   LoCarollee HerterYvAlferd ApaDO  QUEtiapine (SEROQUEL) 100 MG tablet Take 100 mg by mouth at bedtime. 02/23/19   [provider]  torsemide (DEMADEX) 20 MG tablet Take 2 tablets by mouth once daily 07/15/19   LoCarollee HerterYvAlferd ApaDO  warfarin (COUMADIN) 2.5 MG tablet Take 1 tablet (2.5 mg total) by mouth daily. 08/15/19   [place]ErDonnamarie PoagDO  warfarin (COUMADIN) 5 MG tablet Take 0.5 tablets (2.5 mg total) by mouth daily at 6 PM. 08/08/19 09/07/19  [place]Er[place]namarie PoagDO  zolpidem (AMBIEN) 5 MG tablet Take 2 tablets (10 mg total) by mouth at bedtime. Patient taking differently: Take 5 mg by mouth at bedtime.  05/02/17   JoMurlean IbaMD    No current facility-administered medications for this encounter.    Current Outpatient Medications  Medication Sig Dispense Refill  . Accu-Chek FastClix Lancets MISC USE TO CHECK BLOOD SUGAR UP TO FOUR TIMES DAILY AS DIRECTED 306 each 0  . ACCU-CHEK GUIDE test strip USE TO CHECK BLOOD SUGAR UP TO FOUR TIMES DAILY 100 each 2  . albuterol (PROVENTIL HFA;VENTOLIN HFA) 108 (90 Base) MCG/ACT inhaler Inhale 1-2 puffs into the lungs every 6 (six) hours as needed for wheezing or shortness of breath.    . Marland Kitchenlbuterol (PROVENTIL) (2.5 MG/3ML) 0.083% nebulizer solution Take 3 mLs (2.5 mg total) by nebulization every 6 (six) hours as needed for wheezing or shortness of breath. 150 mL 1  . blood glucose meter kit and supplies KIT Dispense based on patient and insurance preference. Use up to four times daily as directed. (FOR  ICD-9 250.00, 250.01). 1 each 0  . busPIRone (BUSPAR) 15 MG tablet Take 15 mg by mouth daily.   4  . famotidine (PEPCID) 20 MG tablet Take 20 mg by mouth daily as needed for heartburn or indigestion.    . fluticasone (FLONASE) 50 MCG/ACT nasal spray Place 2 sprays into both nostrils daily as needed for allergies.    . Marland KitchenlipiZIDE (GLUCOTROL) 5 MG tablet Take 1 tablet (5 mg total) by mouth 2 (two) times daily before a meal. 60 tablet 2  . insulin glargine (LANTUS) 100 UNIT/ML injection Inject 0.3 mLs (30 Units total) into the skin at bedtime. 10 mL 5  . Insulin Syringe-Needle U-100 28G  X 5/16" 0.5 ML MISC Use with lantus once a day 100 each 1  . levothyroxine (SYNTHROID) 50 MCG tablet Take 1 tablet by mouth once daily 90 tablet 1  . lisinopril (ZESTRIL) 20 MG tablet Take 1 tablet by mouth once daily (Patient taking differently: Take 20 mg by mouth daily. ) 90 tablet 0  . LORazepam (ATIVAN) 1 MG tablet Take 0.5-1.5 mg by mouth See admin instructions. Take 0.29m by mouth in the morning and 1.530mby mouth at bedtime as needed for anxiety.    . metFORMIN (GLUCOPHAGE) 500 MG tablet Take 2 tablets (1,000 mg total) by mouth 2 (two) times daily with a meal. 120 tablet 11  . metoprolol succinate (TOPROL-XL) 50 MG 24 hr tablet TAKE 2 TABLETS BY MOUTH ONCE DAILY IN THE MORNING WITH A MEAL OR IMMEDIATELY FOLLOWING A MEAL (Patient taking differently: Take 100 mg by mouth daily. ) 180 tablet 1  . NONFORMULARY OR COMPOUNDED ITEM Pt/ inr   Dx dvt   Tomorrow 04/05/2019  Please call office [phone] results 1 each 0  . NONFORMULARY OR COMPOUNDED ITEM PT/ inr    Dx dvt 1 each 0  . NONFORMULARY OR COMPOUNDED ITEM Compression stockings  20-30 mm/hg  #1   Dx low ext edema 1 each 0  . OXYGEN Inhale 3 L into the lungs continuous.     . potassium chloride SA (KLOR-CON) 20 MEQ tablet Take 1 tablet by mouth once daily 90 tablet 0  . QUEtiapine (SEROQUEL) 100 MG tablet Take 100 mg by mouth at bedtime.    . torsemide  (DEMADEX) 20 MG tablet Take 2 tablets by mouth once daily 180 tablet 0  . warfarin (COUMADIN) 2.5 MG tablet Take 1 tablet (2.5 mg total) by mouth daily. 30 tablet 3  . warfarin (COUMADIN) 5 MG tablet Take 0.5 tablets (2.5 mg total) by mouth daily at 6 PM. 90 tablet 0  . zolpidem (AMBIEN) 5 MG tablet Take 2 tablets (10 mg total) by mouth at bedtime. (Patient taking differently: Take 5 mg by mouth at bedtime. )      Allergies as of 08/16/2019 - Review Complete 08/16/2019  Allergen Reaction Noted  . Hydrocodone Other (See Comments) 08/02/2019  . Norvasc [amlodipine besylate]  01/31/2018    Family History  Problem Relation Age of Onset  . Heart disease Father        MVP and Pics Valve  . Hypertension Father   . Depression Father        Institutionalized x's 2 years  . Bipolar disorder Father   . Hypertension Sister   . Diabetes Sister   . Hyperlipidemia Sister   . Heart disease Sister [id number]     MI  . Heart disease Brother   . Hypertension Brother   . Heart disease Paternal Grandmother   . Heart disease Paternal Aunt   . Heart disease Paternal Uncle   . Schizophrenia Paternal Aunt   . Asthma Son   . Asthma Son     Social History   Socioeconomic History  . Marital status: Divorced    Spouse name: Not on file  . Number of children: Not on file  . Years of education: Not on file  . Highest education level: Not on file  Occupational History  . Occupation: disabled- IT sales professionaled  Social Needs  . Financial resource strain: Not on file  . Food insecurity    Worry: Not on file    Inability: Not on  file  . Transportation needs    Medical: Not on file    Non-medical: Not on file  Tobacco Use  . Smoking status: Former Smoker    Packs/day: 1.00    Years: 40.00    Pack years: 40.00    Types: Cigarettes    Start date: 07/21/1972    Quit date: 10/16/2011    Years since quitting: 7.8  . Smokeless tobacco: Never Used  Substance and Sexual Activity  . Alcohol use: Not  Currently    Comment: Occ-- Wine  . Drug use: No  . Sexual activity: Not on file  Lifestyle  . Physical activity    Days per week: Not on file    Minutes per session: Not on file  . Stress: Not on file  Relationships  . Social Herbalist on phone: Not on file    Gets together: Not on file    Attends religious service: Not on file    Active member of club or organization: Not on file    Attends meetings of clubs or organizations: Not on file    Relationship status: Not on file  . Intimate partner violence    Fear of current or ex partner: Not on file    Emotionally abused: Not on file    Physically abused: Not on file    Forced sexual activity: Not on file  Other Topics Concern  . Not on file  Social History Narrative   [facility] (04/06/17):   Originally from [place]. Has also lived in [place], [place]. She also previously lived in [place] for 20 years. No history of [place]. Moved to [place] in 1989. 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 No pets currently. No bird exposure. Questionable previous mold exposure in her daughter's home. 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     Review of Systems: As per HPI, all others negative  Physical Exam: Vital signs in last 24 hours: Temp:  [98.3 F (36.8 C)] 98.3 F (36.8 C) (12/04 0929) Pulse Rate:  [79-87] 80 (12/04 1352) Resp:  [17-28] 17 (12/04 1352) BP: (117-148)/(70-91) 128/73 (12/04 1352) SpO2:  [94 %-99 %] 95 % (12/04 1352) Weight:  [125.2 kg] 125.2 kg (12/04 0931)   General:   Alert, overweight, Well-developed, well-nourished, pleasant and cooperative in NAD Head:  Normocephalic and atraumatic. Eyes:  Sclera clear, no icterus.   Conjunctiva pink. Ears:  Normal auditory acuity. Nose:  No deformity, discharge,   or lesions. Mouth:  No deformity or lesions.  Oropharynx pink & moist. Neck:  Supple; no masses or thyromegaly. Abdomen:  Soft, protuberant, nontender and nondistended. No masses, hepatosplenomegaly or hernias noted. Normal bowel sounds, without guarding, and without rebound.     Msk:  Symmetrical without gross deformities. Normal posture. Pulses:  Normal pulses noted. Extremities:  Without clubbing or edema. Neurologic:  Alert and  oriented x4; diffusely weak, otherwise grossly normal neurologically. Skin:  Intact without significant lesions or rashes. Psych:  Alert and cooperative. Normal mood and affect.   Lab Results: Recent Labs    08/16/19 0951  WBC 9.6  HGB 9.0*  HCT 29.5*  PLT 300   BMET Recent Labs    08/16/19 0951  NA 141  K 3.0*  CL 95*  CO2 29  GLUCOSE 127*  BUN 18  CREATININE 1.87*  CALCIUM 7.0*   LFT Recent Labs    08/16/19 0951  PROT 8.2*  ALBUMIN 3.2*  AST 27  ALT 13  ALKPHOS 115  BILITOT 0.8   PT/INR Recent Labs    08/16/19 0951  LABPROT 12.7  INR 1.0    Studies/Results: No results found.  Impression:  1.  Recurrent hematochezia.  Hemodynamically stable. Recent prior admission for same problem.  Unclear source, prior tagged study + proximal colon or distal small bowel, but resolved on its own.  Colonoscopy recently with diverticulosis but no clear source, but with suboptimal bowel preparation. 2.  Acute blood loss anemia.  Hgb about same now as at time of discharge last month. 3.  Chronic anticoagulation, warfarin.  However, never restarted warfarin since her hospital discharge and current INR not elevated (1.0).  Plan:  1.  Would admit to [facility]. 2.  Serial CBCs, follow hemodynamics, transfuse if needed. 3.  Hold anticoagulation. 4.  If no further bleeding (hadn't had any in a few hours) in the next couple hours, she could have clear liquids tonight. 5.  If patient has recurrent bleeding this admission, would repeat tagged RBC study  and may have to consider IR consultation for consideration of angioembolization (mindful however of patient's renal insufficiency). 6.  [facility] will follow.   LOS: 0 days   Audra Kagel M  08/16/2019, 2:00 PM  Cell [phone] If no answer or after 5 PM call [phone]

## 2019-08-16 NOTE — Progress Notes (Signed)
Contacted Dr. Tawanna Solo for orders for glucose monitoring, Lantus, and Glipizide.

## 2019-08-16 NOTE — ED Notes (Signed)
Visitor at bedside.

## 2019-08-16 NOTE — ED Provider Notes (Signed)
[facility] DEPT Provider Note   CSN: [id number] Arrival date & time: 08/16/19  7893     History   Chief Complaint Chief Complaint  Patient presents with  . Rectal Bleeding  . Abdominal Pain    HPI Sue [name] is a 68 y.o. female.     HPI  68 yo female ho gi bleeding seen inpatient with anemia (11/26), patient had been doing ok untile noted blood in stool again yesterday.  Now with four episodes of maroon stool.  Patient with some lightheadedness and generalized weakness.  SHe had been on coumadin for dvt but has not restarted.  Denies chest pain, abodminal pain, fever, cough, sore throat, nausea or vomiting.   Past Medical History:  Diagnosis Date  . Allergic rhinitis   . Depression   . Emphysema of lung ([place])    3L home O2  . GERD (gastroesophageal reflux disease)   . Hypertension   . Hypothyroidism   . Obesity, morbid, BMI 50 or higher ([place])   . Stroke [facility]) 2016   TIA   . Urine incontinence     Patient Active Problem List   Diagnosis Date Noted  . Symptomatic anemia 08/03/2019  . QT prolongation 08/03/2019  . GI bleed 08/02/2019  . Diabetes mellitus type II, uncontrolled ([place]) 05/29/2019  . Hyperlipidemia associated with type 2 diabetes mellitus ([place]) 05/29/2019  . DVT (deep venous thrombosis) ([place]) 03/17/2019  . Acute on chronic renal insufficiency 03/17/2019  . Macrocytic anemia 03/17/2019  . Small bowel obstruction ([place]) 03/08/2019  . Fracture of left tibia 02/25/2019  . Left tibial fracture 02/25/2019  . Hyperglycemia 08/16/2018  . Morbid obesity due to excess calories ([place]) 05/09/2018  . Acute bronchitis with COPD ([place]) 10/19/2017  . Atypical chest pain 05/01/2017  . Pulmonary emphysema ([place]) 04/06/2017  . Chronic seasonal allergic rhinitis 04/06/2017  . GERD (gastroesophageal reflux disease) 04/06/2017  . Snoring 04/06/2017  . Myalgia 02/01/2017  . Arthralgia 02/01/2017  . Chronic pain of toe of left foot  11/14/2016  . Chronic hepatitis C without hepatic coma ([place]) 08/01/2016  . Hypothyroidism 10/29/2015  . Mild diastolic dysfunction 81/09/7508  . Edema 01/01/2015  . Edema of both ankles 12/24/2014  . TIA (transient ischemic attack) 07/30/2014  . Weakness 07/29/2014  . Flank pain 11/12/2013  . Chronic respiratory failure ([place]) 10/25/2013  . DOE (dyspnea on exertion) 10/25/2013  . Depression with anxiety 10/15/2013  . Insomnia 10/15/2013  . HTN (hypertension) 10/09/2013  . Hypokalemia 10/09/2013    Past Surgical History:  Procedure Laterality Date  . ABDOMINAL HYSTERECTOMY    . CESAREAN SECTION    . COLONOSCOPY WITH PROPOFOL N/A 08/05/2019   Procedure: COLONOSCOPY WITH PROPOFOL;  Surgeon: Wilford Corner, MD;  Location: [facility] ENDOSCOPY;  Service: Gastroenterology;  Laterality: N/A;     OB History   No obstetric history on file.      Home Medications    Prior to Admission medications   Medication Sig Start Date End Date Taking? Authorizing Provider  Accu-Chek FastClix Lancets MISC USE TO CHECK BLOOD SUGAR UP TO FOUR TIMES DAILY AS DIRECTED 07/15/19   Carollee Herter, Kendrick Fries R, DO  ACCU-CHEK GUIDE test strip USE TO CHECK BLOOD SUGAR UP TO FOUR TIMES DAILY 05/28/19   Carollee Herter, Alferd Apa, DO  albuterol (PROVENTIL HFA;VENTOLIN HFA) 108 (90 Base) MCG/ACT inhaler Inhale 1-2 puffs into the lungs every 6 (six) hours as needed for wheezing or shortness of breath.    [provider]  albuterol (PROVENTIL) (2.5 MG/3ML) 0.083% nebulizer solution Take 3 mLs (2.5 mg total) by nebulization every 6 (six) hours as needed for wheezing or shortness of breath. 03/31/17   Carollee Herter, Alferd Apa, DO  blood glucose meter kit and supplies KIT Dispense based on patient and insurance preference. Use up to four times daily as directed. (FOR ICD-9 250.00, 250.01). 10/04/18   Carollee Herter, Alferd Apa, DO  busPIRone (BUSPAR) 15 MG tablet Take 15 mg by mouth daily.  12/27/17   [provider]   famotidine (PEPCID) 20 MG tablet Take 20 mg by mouth daily as needed for heartburn or indigestion.    [provider]  fluticasone (FLONASE) 50 MCG/ACT nasal spray Place 2 sprays into both nostrils daily as needed for allergies. 05/02/17   Johnson, Clanford L, MD  glipiZIDE (GLUCOTROL) 5 MG tablet Take 1 tablet (5 mg total) by mouth 2 (two) times daily before a meal. 05/29/19   Carollee Herter, Alferd Apa, DO  insulin glargine (LANTUS) 100 UNIT/ML injection Inject 0.3 mLs (30 Units total) into the skin at bedtime. 12/06/18   Ann Held, DO  Insulin Syringe-Needle U-100 28G X 5/16" 0.5 ML MISC Use with lantus once a day 12/06/18   Carollee Herter, Alferd Apa, DO  levothyroxine (SYNTHROID) 50 MCG tablet Take 1 tablet by mouth once daily 07/01/19   Carollee Herter, Alferd Apa, DO  lisinopril (ZESTRIL) 20 MG tablet Take 1 tablet by mouth once daily Patient taking differently: Take 20 mg by mouth daily.  05/03/19   Roma Schanz R, DO  LORazepam (ATIVAN) 1 MG tablet Take 0.5-1.5 mg by mouth See admin instructions. Take 0.43m by mouth in the morning and 1.57mby mouth at bedtime as needed for anxiety.    [provider]  metFORMIN (GLUCOPHAGE) 500 MG tablet Take 2 tablets (1,000 mg total) by mouth 2 (two) times daily with a meal. 08/18/18 08/18/19  Danford, ChSuann LarryMD  metoprolol succinate (TOPROL-XL) 50 MG 24 hr tablet TAKE 2 TABLETS BY MOUTH ONCE DAILY IN THE MORNING WITH A MEAL OR IMMEDIATELY FOLLOWING A MEAL Patient taking differently: Take 100 mg by mouth daily.  01/03/19   LoAnn HeldDO  NONFORMULARY OR COMPOUNDED ITEM Pt/ inr   Dx dvt   Tomorrow 04/05/2019  Please call office [phone] results 04/04/19   LoCarollee HerterYvAlferd ApaDO  NONFORMULARY OR COMPOUNDED ITEM PT/ inr    Dx dvt 04/08/19   LoCarollee HerterYvAlferd ApaDO  NONFORMULARY OR COMPOUNDED ITEM Compression stockings  20-30 mm/hg  #1   Dx low ext edema 05/28/19   LoCarollee HerterYvAlferd ApaDO  OXYGEN Inhale 3 L into the  lungs continuous.     [provider]  potassium chloride SA (KLOR-CON) 20 MEQ tablet Take 1 tablet by mouth once daily 06/20/19   LoCarollee HerterYvAlferd ApaDO  QUEtiapine (SEROQUEL) 100 MG tablet Take 100 mg by mouth at bedtime. 02/23/19   [provider]  torsemide (DEMADEX) 20 MG tablet Take 2 tablets by mouth once daily 07/15/19   LoCarollee HerterYvAlferd ApaDO  warfarin (COUMADIN) 2.5 MG tablet Take 1 tablet (2.5 mg total) by mouth daily. 08/15/19   [place]ErDonnamarie PoagDO  warfarin (COUMADIN) 5 MG tablet Take 0.5 tablets (2.5 mg total) by mouth daily at 6 PM. 08/08/19 09/07/19  [place]Er[place]namarie PoagDO  zolpidem (AMBIEN) 5 MG tablet Take 2 tablets (10 mg total) by mouth at bedtime. Patient  taking differently: Take 5 mg by mouth at bedtime.  05/02/17   Murlean Iba, MD    Family History Family History  Problem Relation Age of Onset  . Heart disease Father        MVP and Pics Valve  . Hypertension Father   . Depression Father        Institutionalized x's 2 years  . Bipolar disorder Father   . Hypertension Sister   . Diabetes Sister   . Hyperlipidemia Sister   . Heart disease Sister 79       MI  . Heart disease Brother   . Hypertension Brother   . Heart disease Paternal Grandmother   . Heart disease Paternal Aunt   . Heart disease Paternal Uncle   . Schizophrenia Paternal Aunt   . Asthma Son   . Asthma Son     Social History Social History   Tobacco Use  . Smoking status: Former Smoker    Packs/day: 1.00    Years: 40.00    Pack years: 40.00    Types: Cigarettes    Start date: 07/21/1972    Quit date: 10/16/2011    Years since quitting: 7.8  . Smokeless tobacco: Never Used  Substance Use Topics  . Alcohol use: Not Currently    Comment: Occ-- Wine  . Drug use: No     Allergies   Hydrocodone and Norvasc [amlodipine besylate]   Review of Systems Review of Systems  All other systems reviewed and are negative.    Physical Exam Updated Vital Signs BP  135/70 (BP Location: Left Arm)   Pulse 87   Temp 98.3 F (36.8 C) (Oral)   Resp 18   Ht 1.6 m (_0 )   Wt 125.2 kg   SpO2 94%   BMI 48.89 kg/m   Physical Exam Vitals signs and nursing note reviewed.  Constitutional:      General: She is not in acute distress.    Appearance: She is well-developed. She is obese. She is not ill-appearing.  HENT:     Head: Normocephalic.     Mouth/Throat:     Mouth: Mucous membranes are moist.  Eyes:     Extraocular Movements: Extraocular movements intact.  Cardiovascular:     Rate and Rhythm: Normal rate and regular rhythm.  Pulmonary:     Effort: Pulmonary effort is normal.     Breath sounds: Normal breath sounds.  Abdominal:     General: Bowel sounds are normal. There is no distension.     Palpations: Abdomen is soft.     Comments: Rectal exam without masses or ttp, maroon stool  Genitourinary:    Rectum: Guaiac result positive. No mass or tenderness.  Skin:    General: Skin is warm and dry.     Capillary Refill: Capillary refill takes less than 2 seconds.  Neurological:     General: No focal deficit present.     Mental Status: She is alert. She is disoriented.      ED Treatments / Results  Labs (all labs ordered are listed, but only abnormal results are displayed) Labs Reviewed  COMPREHENSIVE METABOLIC PANEL  CBC  PROTIME-INR  POC OCCULT BLOOD, ED  TYPE AND SCREEN    EKG EKG Interpretation  Date/Time:  Friday August 16 2019 09:59:41 EST Ventricular Rate:  83 PR Interval:    QRS Duration: 104 QT Interval:  431 QTC Calculation: 507 R Axis:   35 Text Interpretation: Sinus rhythm Borderline T abnormalities,  anterior leads Prolonged QT interval Confirmed by Pattricia Boss [phone]) on 08/16/2019 10:04:13 AM   Radiology No results found.  Procedures Procedures (including critical care time)  Medications Ordered in ED Medications - No data to display   Initial Impression / Assessment and Plan / ED Course  I have  reviewed the triage vital signs and the nursing notes.  Pertinent labs & imaging results that were available during my care of the patient were reviewed by me and considered in my medical decision making (see chart for details).    68 year old female with recent GI bleed requiring transfusion and nondiagnostic colonoscopy who presents today with repeat rectal bleeding.  Patient was seen and scoped by [facility].  They have not seen her in follow-up.  GI was consulted and I was directed to [facility].  Spoke with [facility], and they will see in consult.  Final Clinical Impressions(s) / ED Diagnoses   Final diagnoses:  None    ED Discharge Orders    None       Pattricia Boss, MD 08/16/19 [phone]

## 2019-08-16 NOTE — ED Triage Notes (Signed)
Patient states she noticed "marron/red blood since yesterday." Patient states states she has had 4 episodes. Patient also c/o abdominal cramping as well.

## 2019-08-17 DIAGNOSIS — K922 Gastrointestinal hemorrhage, unspecified: Secondary | ICD-10-CM | POA: Diagnosis not present

## 2019-08-17 LAB — BASIC METABOLIC PANEL
Anion gap: 14 (ref 5–15)
BUN: 18 mg/dL (ref 8–23)
CO2: 32 mmol/L (ref 22–32)
Calcium: 7 mg/dL — ABNORMAL LOW (ref 8.9–10.3)
Chloride: 95 mmol/L — ABNORMAL LOW (ref 98–111)
Creatinine, Ser: 1.65 mg/dL — ABNORMAL HIGH (ref 0.44–1.00)
GFR calc Af Amer: 37 mL/min — ABNORMAL LOW (ref >60)
GFR calc non Af Amer: 32 mL/min — ABNORMAL LOW (ref >60)
Glucose, Bld: 116 mg/dL — ABNORMAL HIGH (ref 70–99)
Potassium: 3.1 mmol/L — ABNORMAL LOW (ref 3.5–5.1)
Sodium: 141 mmol/L (ref 135–145)

## 2019-08-17 LAB — CBC
HCT: 26.5 % — ABNORMAL LOW (ref 36.0–46.0)
Hemoglobin: 8.2 g/dL — ABNORMAL LOW (ref 12.0–15.0)
MCH: 28.8 pg (ref 26.0–34.0)
MCHC: 30.9 g/dL (ref 30.0–36.0)
MCV: 93 fL (ref 80.0–100.0)
Platelets: 250 10*3/uL (ref 150–400)
RBC: 2.85 MIL/uL — ABNORMAL LOW (ref 3.87–5.11)
RDW: 14.7 % (ref 11.5–15.5)
WBC: 7.6 10*3/uL (ref 4.0–10.5)
nRBC: 0 % (ref 0.0–0.2)

## 2019-08-17 LAB — GLUCOSE, CAPILLARY
Glucose-Capillary: 100 mg/dL — ABNORMAL HIGH (ref 70–99)
Glucose-Capillary: 224 mg/dL — ABNORMAL HIGH (ref 70–99)
Glucose-Capillary: 177 mg/dL — ABNORMAL HIGH (ref 70–99)
Glucose-Capillary: 200 mg/dL — ABNORMAL HIGH (ref 70–99)

## 2019-08-17 LAB — MAGNESIUM: Magnesium: 1.1 mg/dL — ABNORMAL LOW (ref 1.7–2.4)

## 2019-08-17 LAB — TYPE AND SCREEN
ABO/RH(D): O POS
Antibody Screen: POSITIVE
PT AG Type: NEGATIVE

## 2019-08-17 MED ORDER — POTASSIUM CHLORIDE 20 MEQ PO PACK
40.0000 meq | PACK | ORAL | Status: AC
  Administered 2019-08-17 (×2): 40 meq via ORAL
  Filled 2019-08-17 (×2): qty 2

## 2019-08-17 MED ORDER — CYCLOBENZAPRINE HCL 5 MG PO TABS
5.0000 mg | ORAL_TABLET | Freq: Three times a day (TID) | ORAL | Status: DC | PRN
  Administered 2019-08-18: 5 mg via ORAL
  Filled 2019-08-17: qty 1

## 2019-08-17 NOTE — Progress Notes (Signed)
Subjective: No blood in stool or bowel movement since admission. No abdominal pain  Objective: Vital signs in last 24 hours: Temp:  [98.2 F (36.8 C)-98.8 F (37.1 C)] 98.8 F (37.1 C) (12/05 1326) Pulse Rate:  [81-102] 102 (12/05 1326) Resp:  [16-29] 18 (12/05 1326) BP: (127-159)/(70-113) 145/97 (12/05 1326) SpO2:  [92 %-100 %] 98 % (12/05 1326) Weight change:  Last BM Date: 08/16/19  PE: GEN:  NAD  Lab Results: CBC    Component Value Date/Time   WBC 7.6 08/17/2019 0537   RBC 2.85 (L) 08/17/2019 0537   HGB 8.2 (L) 08/17/2019 0537   HGB 12.6 01/17/2018 1616   HCT 26.5 (L) 08/17/2019 0537   HCT 32.3 (L) 03/10/2019 0545   PLT 250 08/17/2019 0537   PLT 235 01/17/2018 1616   MCV 93.0 08/17/2019 0537   MCV 96 01/17/2018 1616   MCH 28.8 08/17/2019 0537   MCHC 30.9 08/17/2019 0537   RDW 14.7 08/17/2019 0537   RDW 14.0 01/17/2018 1616   LYMPHSABS 2.5 05/28/2019 1557   MONOABS 0.5 05/28/2019 1557   EOSABS 0.3 05/28/2019 1557   BASOSABS 0.1 05/28/2019 1557   CMP     Component Value Date/Time   NA 141 08/17/2019 0537   NA 140 01/31/2018 1514   K 3.1 (L) 08/17/2019 0537   CL 95 (L) 08/17/2019 0537   CO2 32 08/17/2019 0537   GLUCOSE 116 (H) 08/17/2019 0537   BUN 18 08/17/2019 0537   BUN 15 01/31/2018 1514   CREATININE 1.65 (H) 08/17/2019 0537   CREATININE 1.08 (H) 08/01/2016 1103   CALCIUM 7.0 (L) 08/17/2019 0537   PROT 8.2 (H) 08/16/2019 0951   ALBUMIN 3.2 (L) 08/16/2019 0951   AST 27 08/16/2019 0951   ALT 13 08/16/2019 0951   ALKPHOS 115 08/16/2019 0951   BILITOT 0.8 08/16/2019 0951   GFRNONAA 32 (L) 08/17/2019 0537   GFRAA 37 (L) 08/17/2019 0537   Assessment:  1.  Recurrent hematochezia.  No bleeding since admission.  Hemodynamically stable. Recent prior admission for same problem.  Unclear source, prior tagged study + proximal colon or distal small bowel, but resolved on its own.  Colonoscopy recently with diverticulosis but no clear source, but with  suboptimal bowel preparation. 2.  Acute blood loss anemia.  Hgb about same now as at time of discharge last month. 3.  Chronic anticoagulation, warfarin.  However, never restarted warfarin since her hospital discharge and current INR not elevated (1.0).  Plan:  1.  Advance diet to soft diet. 2.  Follow CBC and transfuse as needed. 3.  If rebleeding today/tonight, would pursue tagged RBC study as next step in management. 3.  If no bleeding overnight, and given need for future anticoagulation, and suboptimal bowel preparation on recent colonoscopy, would consider colon prep tomorrow for possible colonoscopy Monday.  4.  [facility] will follow.   Landry Dyke 08/17/2019, 2:42 PM   Cell [phone] If no answer or after 5 PM call [phone]

## 2019-08-17 NOTE — Progress Notes (Signed)
PROGRESS NOTE    Sue [name]  [id number] DOB: 12/04/1950 DOA: 08/16/2019 PCP: Ann Held, DO   Brief Narrative: Sue [name] is a 68 y.o. female with medical history significant of DVT on Coumadin, chronic hypoxic respiratory failure on 3 L of oxygen at baseline, COPD, insulin-dependent diabetes mellitus, morbid obesity, CKD stage IIIb depression, anxiety who presented from her home today to the emergency department with complaints of  maroon-colored stools, passage of clots from her rectum.  She was just discharged here on 08/08/2019 when she was admitted for the evaluation of same problem.  At that time her hemoglobin was in the range of 4.6, she was transfused with a unit of PRBC, monitor therapy, given IV vitamin K.  RBC tagged scan was significant for active GI bleed in the right lower abdomen and she underwent colonoscopy with finding of red blood in the entire colon but could not find the source of active bleeding.She has not taken any Coumadin since discharge. Patient has remained hemodynamically stable.  Her hemoglobin is stable in the range of 8.  She does not have any bowel movement since she has been admitted.  GI following and planning for colonoscopy on Monday.  Assessment & Plan:   Active Problems:   Lower GI bleed   Hematochezia: Presented with maroon-colored stools, clots, blood on wipes.  Denies any abdominal pain, nausea or vomiting.  Has not taken any Coumadin since she was discharged. GI consulted.  Her hemoglobin is stable.  She is not having any active bleeding at present. We will continue to monitor H&H very closely.  If she bleeds actively, will consult IR for embolization.On soft diet. Colonoscopy on 08/05/2019 showed blood found in the entire colon with scattered small mouth diverticula, internal hemorrhoids, maroon-colored clots, poor visualization without finding of any active bleeding site.  She was transfused with a total of 5 units during her  hospitalization.  When she was discharged, her hemoglobin is stable at 8.6.  Recommended to hold Coumadin for 7 days. I do not think she needs to continue Coumadin anymore.  She developed DVT which was provoked due to her fracture of the left leg and it was started on June 2020. GI planning for colonoscopy on Monday.  CKD stage IIIb: Baseline creatinine fluctuating 1.5-2.5.  Today creatinine is in the range of 1.8.  It was 1.28 on discharge.  Patient had significant bilateral lower extremity edema on presentation.  I will continue torsemide and continue to monitor her kidney function.  She follows with her PCP.  Chronic hypoxic respiratory failure/history of COPD: Follows with pulmonology.  Uses 3 L of oxygen at home 24 hours.  Respiratory status stable at present.  Not in exacerbation.  Continue bronchodilators as needed.  Chronic diastolic CHF: Takes torsemide 40 mg daily at home.  Has bilateral lower extremity edema.  Looks mildly volume overloaded.  Has fine crackles on lung bases.  Echocardiogram done on 03/2017 showed his ejection residual [id number], grade 1 diastolic dysfunction.    Depression/anxiety: Continue buspirone, Ativan as needed, Seroquel  Pulmonary nodule: History of 5 mm right lower lobe pulmonary nodule.  Former smoker with a 40-year pack smoking history.  Repeat noncontrast chest CT in 12 months for follow-up.  Left lower extremity DVT: Provoked.  On June 2020.  Has been on Coumadin since then.  Will discontinue Coumadin on discharge  Morbid obesity: BMI of 48.89.         DVT prophylaxis:SCD Code Status: Full Family  Communication: None present at the bed side Disposition Plan: Home after full work up   Consultants: GI   Procedures:None  Antimicrobials:  Anti-infectives (From admission, onward)   None      Subjective: Patient seen and examined the bedside this morning.  Hemodynamically stable.  No bowel movement after she has been admitted.  Complains of  some abdominal cramps.  Lower extremity edema improved today.  Objective: Vitals:   08/16/19 1702 08/16/19 2045 08/17/19 0534 08/17/19 1326  BP: 127/85 137/70 (!) 148/90 (!) 145/97  Pulse: 81 84 96 (!) 102  Resp: 19 20 16 18   Temp: 98.2 F (36.8 C) 98.2 F (36.8 C) 98.3 F (36.8 C) 98.8 F (37.1 C)  TempSrc: Oral   Oral  SpO2: 98% 97% 100% 98%  Weight:      Height:        Intake/Output Summary (Last 24 hours) at 08/17/2019 1532 Last data filed at 08/17/2019 1325 Gross per 24 hour  Intake 600 ml  Output -  Net 600 ml   Filed Weights   08/16/19 0931  Weight: 125.2 kg    Examination:  General exam: Not in distress,morbidly obese HEENT:PERRL,Oral mucosa moist, Ear/Nose normal on gross exam Respiratory system: Bilateral equal air entry, normal vesicular breath sounds, no wheezes or crackles  Cardiovascular system: S1 & S2 heard, RRR. No JVD, murmurs, rubs, gallops or clicks. Trace pedal edema. Gastrointestinal system: Abdomen is nondistended, soft and nontender. No organomegaly or masses felt. Normal bowel sounds heard. Central nervous system: Alert and oriented. No focal neurological deficits. Extremities:Trace bilateral lower extremity edema, no clubbing ,no cyanosis, distal peripheral pulses palpable. Skin: No rashes, lesions or ulcers,no icterus ,no pallor     Data Reviewed: I have personally reviewed following labs and imaging studies  CBC: Recent Labs  Lab 08/16/19 0951 08/17/19 0537  WBC 9.6 7.6  HGB 9.0* 8.2*  HCT 29.5* 26.5*  MCV 93.4 93.0  PLT 300 [id number]   Basic Metabolic Panel: Recent Labs  Lab 08/16/19 0951 08/17/19 0537  NA 141 141  K 3.0* 3.1*  CL 95* 95*  CO2 29 32  GLUCOSE 127* 116*  BUN 18 18  CREATININE 1.87* 1.65*  CALCIUM 7.0* 7.0*  MG  --  1.1*   GFR: Estimated Creatinine Clearance: 42.6 mL/min (A) (by C-G formula based on SCr of 1.65 mg/dL (H)). Liver Function Tests: Recent Labs  Lab 08/16/19 0951  AST 27  ALT 13  ALKPHOS  115  BILITOT 0.8  PROT 8.2*  ALBUMIN 3.2*   No results for input(s): LIPASE, AMYLASE in the last 168 hours. No results for input(s): AMMONIA in the last 168 hours. Coagulation Profile: Recent Labs  Lab 08/16/19 0951  INR 1.0   Cardiac Enzymes: No results for input(s): CKTOTAL, CKMB, CKMBINDEX, TROPONINI in the last 168 hours. BNP (last 3 results) No results for input(s): PROBNP in the last 8760 hours. HbA1C: No results for input(s): HGBA1C in the last 72 hours. CBG: Recent Labs  Lab 08/16/19 2047 08/17/19 0728 08/17/19 1113  GLUCAP 225* 100* 224*   Lipid Profile: No results for input(s): CHOL, HDL, LDLCALC, TRIG, CHOLHDL, LDLDIRECT in the last 72 hours. Thyroid Function Tests: No results for input(s): TSH, T4TOTAL, FREET4, T3FREE, THYROIDAB in the last 72 hours. Anemia Panel: No results for input(s): VITAMINB12, FOLATE, FERRITIN, TIBC, IRON, RETICCTPCT in the last 72 hours. Sepsis Labs: No results for input(s): PROCALCITON, LATICACIDVEN in the last 168 hours.  Recent Results (from the past 240 hour(s))  SARS CORONAVIRUS 2 (TAT 6-24 HRS) Nasopharyngeal Nasopharyngeal Swab     Status: None   Collection Time: 08/16/19 10:32 AM   Specimen: Nasopharyngeal Swab  Result Value Ref Range Status   SARS Coronavirus 2 NEGATIVE NEGATIVE Final    Comment: (NOTE) SARS-CoV-2 target nucleic acids are NOT DETECTED. The SARS-CoV-2 RNA is generally detectable in upper and lower respiratory specimens during the acute phase of infection. Negative results do not preclude SARS-CoV-2 infection, do not rule out co-infections with other pathogens, and should not be used as the sole basis for treatment or other patient management decisions. Negative results must be combined with clinical observations, patient history, and epidemiological information. The expected result is Negative. Fact Sheet for Patients: SugarRoll.be Fact Sheet for Healthcare Providers:  https://www.woods-mathews.com/ This test is not yet approved or cleared by the [place] FDA and  has been authorized for detection and/or diagnosis of SARS-CoV-2 by FDA under an Emergency Use Authorization (EUA). This EUA will remain  in effect (meaning this test can be used) for the duration of the COVID-19 declaration under Section 56 4(b)(1) of the Act, 21 U.S.C. section 360bbb-3(b)(1), unless the authorization is terminated or revoked sooner. Performed at [facility], [place] [address], [place], [place] [address]          Radiology Studies: No results found.      Scheduled Meds: . busPIRone  15 mg Oral Daily  . insulin aspart  0-15 Units Subcutaneous TID WC  . insulin aspart  0-5 Units Subcutaneous QHS  . levothyroxine  50 mcg Oral Daily  . metoprolol succinate  100 mg Oral Daily  . QUEtiapine  100 mg Oral QHS  . torsemide  40 mg Oral Daily  . zolpidem  5 mg Oral QHS   Continuous Infusions:   LOS: 0 days    Time spent: 25 mins.More than 50% of that time was spent in counseling and/or coordination of care.      Shelly Coss, MD [facility] Pager [phone]  If 7PM-7AM, please contact night-coverage [email or web address] Password [facility] 08/17/2019, 3:32 PM

## 2019-08-18 DIAGNOSIS — F329 Major depressive disorder, single episode, unspecified: Secondary | ICD-10-CM | POA: Diagnosis present

## 2019-08-18 DIAGNOSIS — K5731 Diverticulosis of large intestine without perforation or abscess with bleeding: Secondary | ICD-10-CM | POA: Diagnosis present

## 2019-08-18 DIAGNOSIS — Z79890 Hormone replacement therapy (postmenopausal): Secondary | ICD-10-CM | POA: Diagnosis not present

## 2019-08-18 DIAGNOSIS — Z8349 Family history of other endocrine, nutritional and metabolic diseases: Secondary | ICD-10-CM | POA: Diagnosis not present

## 2019-08-18 DIAGNOSIS — F419 Anxiety disorder, unspecified: Secondary | ICD-10-CM | POA: Diagnosis present

## 2019-08-18 DIAGNOSIS — Z825 Family history of asthma and other chronic lower respiratory diseases: Secondary | ICD-10-CM | POA: Diagnosis not present

## 2019-08-18 DIAGNOSIS — Z87891 Personal history of nicotine dependence: Secondary | ICD-10-CM | POA: Diagnosis not present

## 2019-08-18 DIAGNOSIS — Z794 Long term (current) use of insulin: Secondary | ICD-10-CM | POA: Diagnosis not present

## 2019-08-18 DIAGNOSIS — E1122 Type 2 diabetes mellitus with diabetic chronic kidney disease: Secondary | ICD-10-CM | POA: Diagnosis present

## 2019-08-18 DIAGNOSIS — N1832 Chronic kidney disease, stage 3b: Secondary | ICD-10-CM | POA: Diagnosis present

## 2019-08-18 DIAGNOSIS — I130 Hypertensive heart and chronic kidney disease with heart failure and stage 1 through stage 4 chronic kidney disease, or unspecified chronic kidney disease: Secondary | ICD-10-CM | POA: Diagnosis present

## 2019-08-18 DIAGNOSIS — Z7901 Long term (current) use of anticoagulants: Secondary | ICD-10-CM | POA: Diagnosis not present

## 2019-08-18 DIAGNOSIS — J439 Emphysema, unspecified: Secondary | ICD-10-CM | POA: Diagnosis present

## 2019-08-18 DIAGNOSIS — Z86718 Personal history of other venous thrombosis and embolism: Secondary | ICD-10-CM | POA: Diagnosis not present

## 2019-08-18 DIAGNOSIS — R911 Solitary pulmonary nodule: Secondary | ICD-10-CM | POA: Diagnosis present

## 2019-08-18 DIAGNOSIS — Z6841 Body Mass Index (BMI) 40.0 and over, adult: Secondary | ICD-10-CM | POA: Diagnosis not present

## 2019-08-18 DIAGNOSIS — J9611 Chronic respiratory failure with hypoxia: Secondary | ICD-10-CM | POA: Diagnosis present

## 2019-08-18 DIAGNOSIS — I5032 Chronic diastolic (congestive) heart failure: Secondary | ICD-10-CM | POA: Diagnosis present

## 2019-08-18 DIAGNOSIS — Z8673 Personal history of transient ischemic attack (TIA), and cerebral infarction without residual deficits: Secondary | ICD-10-CM | POA: Diagnosis not present

## 2019-08-18 DIAGNOSIS — K921 Melena: Secondary | ICD-10-CM | POA: Diagnosis present

## 2019-08-18 DIAGNOSIS — D62 Acute posthemorrhagic anemia: Secondary | ICD-10-CM | POA: Diagnosis present

## 2019-08-18 DIAGNOSIS — Z9981 Dependence on supplemental oxygen: Secondary | ICD-10-CM | POA: Diagnosis not present

## 2019-08-18 DIAGNOSIS — Z8249 Family history of ischemic heart disease and other diseases of the circulatory system: Secondary | ICD-10-CM | POA: Diagnosis not present

## 2019-08-18 DIAGNOSIS — K922 Gastrointestinal hemorrhage, unspecified: Secondary | ICD-10-CM | POA: Diagnosis not present

## 2019-08-18 DIAGNOSIS — Z20828 Contact with and (suspected) exposure to other viral communicable diseases: Secondary | ICD-10-CM | POA: Diagnosis present

## 2019-08-18 LAB — BASIC METABOLIC PANEL
Anion gap: 12 (ref 5–15)
BUN: 17 mg/dL (ref 8–23)
CO2: 29 mmol/L (ref 22–32)
Calcium: 7 mg/dL — ABNORMAL LOW (ref 8.9–10.3)
Chloride: 97 mmol/L — ABNORMAL LOW (ref 98–111)
Creatinine, Ser: 1.77 mg/dL — ABNORMAL HIGH (ref 0.44–1.00)
GFR calc Af Amer: 34 mL/min — ABNORMAL LOW (ref >60)
GFR calc non Af Amer: 29 mL/min — ABNORMAL LOW (ref >60)
Glucose, Bld: 163 mg/dL — ABNORMAL HIGH (ref 70–99)
Potassium: 4.6 mmol/L (ref 3.5–5.1)
Sodium: 138 mmol/L (ref 135–145)

## 2019-08-18 LAB — CBC WITH DIFFERENTIAL/PLATELET
Abs Immature Granulocytes: 0.02 10*3/uL (ref 0.00–0.07)
Basophils Absolute: 0 10*3/uL (ref 0.0–0.1)
Basophils Relative: 1 %
Eosinophils Absolute: 0.5 10*3/uL (ref 0.0–0.5)
Eosinophils Relative: 7 %
HCT: 25.2 % — ABNORMAL LOW (ref 36.0–46.0)
Hemoglobin: 7.8 g/dL — ABNORMAL LOW (ref 12.0–15.0)
Immature Granulocytes: 0 %
Lymphocytes Relative: 41 %
Lymphs Abs: 3.3 10*3/uL (ref 0.7–4.0)
MCH: 29.2 pg (ref 26.0–34.0)
MCHC: 31 g/dL (ref 30.0–36.0)
MCV: 94.4 fL (ref 80.0–100.0)
Monocytes Absolute: 0.7 10*3/uL (ref 0.1–1.0)
Monocytes Relative: 9 %
Neutro Abs: 3.4 10*3/uL (ref 1.7–7.7)
Neutrophils Relative %: 42 %
Platelets: 224 10*3/uL (ref 150–400)
RBC: 2.67 MIL/uL — ABNORMAL LOW (ref 3.87–5.11)
RDW: 15 % (ref 11.5–15.5)
WBC: 8.1 10*3/uL (ref 4.0–10.5)
nRBC: 0 % (ref 0.0–0.2)

## 2019-08-18 LAB — GLUCOSE, CAPILLARY
Glucose-Capillary: 139 mg/dL — ABNORMAL HIGH (ref 70–99)
Glucose-Capillary: 161 mg/dL — ABNORMAL HIGH (ref 70–99)
Glucose-Capillary: 176 mg/dL — ABNORMAL HIGH (ref 70–99)
Glucose-Capillary: 151 mg/dL — ABNORMAL HIGH (ref 70–99)

## 2019-08-18 MED ORDER — BISACODYL 10 MG RE SUPP
10.0000 mg | Freq: Once | RECTAL | Status: AC
  Administered 2019-08-18: 10 mg via RECTAL
  Filled 2019-08-18: qty 1

## 2019-08-18 MED ORDER — PEG-KCL-NACL-NASULF-NA ASC-C 100 G PO SOLR: 1.0000 | Freq: Once | ORAL | Status: DC

## 2019-08-18 MED ORDER — PEG-KCL-NACL-NASULF-NA ASC-C 100 G PO SOLR
0.5000 | Freq: Once | ORAL | Status: AC
  Administered 2019-08-18: 100 g via ORAL
  Filled 2019-08-18: qty 1

## 2019-08-18 MED ORDER — PEG-KCL-NACL-NASULF-NA ASC-C 100 G PO SOLR
0.5000 | Freq: Once | ORAL | Status: AC
  Administered 2019-08-19: 100 g via ORAL

## 2019-08-18 NOTE — Progress Notes (Signed)
Subjective: No blood in stool. Mild lower abdominal cramps.  Objective: Vital signs in last 24 hours: Temp:  [97.8 F (36.6 C)-98.7 F (37.1 C)] 98.7 F (37.1 C) (12/06 0546) Pulse Rate:  [87-92] 92 (12/06 0546) Resp:  [18] 18 (12/06 0546) BP: (128-142)/(56-79) 128/56 (12/06 0546) SpO2:  [100 %] 100 % (12/06 0546) Weight change:  Last BM Date: 08/16/19  PE: GEN:  NAD, overweight ABD:  Protuberant, soft, non-tender  Lab Results: CBC    Component Value Date/Time   WBC 8.1 08/18/2019 0623   RBC 2.67 (L) 08/18/2019 0623   HGB 7.8 (L) 08/18/2019 0623   HGB 12.6 01/17/2018 1616   HCT 25.2 (L) 08/18/2019 0623   HCT 32.3 (L) 03/10/2019 0545   PLT 224 08/18/2019 0623   PLT 235 01/17/2018 1616   MCV 94.4 08/18/2019 0623   MCV 96 01/17/2018 1616   MCH 29.2 08/18/2019 0623   MCHC 31.0 08/18/2019 0623   RDW 15.0 08/18/2019 0623   RDW 14.0 01/17/2018 1616   LYMPHSABS 3.3 08/18/2019 0623   MONOABS 0.7 08/18/2019 0623   EOSABS 0.5 08/18/2019 0623   BASOSABS 0.0 08/18/2019 0623   CMP     Component Value Date/Time   NA 138 08/18/2019 0623   NA 140 01/31/2018 1514   K 4.6 08/18/2019 0623   CL 97 (L) 08/18/2019 0623   CO2 29 08/18/2019 0623   GLUCOSE 163 (H) 08/18/2019 0623   BUN 17 08/18/2019 0623   BUN 15 01/31/2018 1514   CREATININE 1.77 (H) 08/18/2019 0623   CREATININE 1.08 (H) 08/01/2016 1103   CALCIUM 7.0 (L) 08/18/2019 0623   PROT 8.2 (H) 08/16/2019 0951   ALBUMIN 3.2 (L) 08/16/2019 0951   AST 27 08/16/2019 0951   ALT 13 08/16/2019 0951   ALKPHOS 115 08/16/2019 0951   BILITOT 0.8 08/16/2019 0951   GFRNONAA 29 (L) 08/18/2019 0623   GFRAA 34 (L) 08/18/2019 [id number]   Assessment:  1. Recurrent hematochezia.  No bleeding since admission.  Hemodynamically stable. Recent prior admission for same problem. Unclear source, prior tagged study + proximal colon or distal small bowel, but resolved on its own. Colonoscopy recently with diverticulosis but no clear source, but  with suboptimal bowel preparation. 2. Acute blood loss anemia. Hgb about same now as at time of discharge last month. 3. Chronic anticoagulation, warfarin. However, never restarted warfarin since her hospital discharge and current INR not elevated (1.0).  Plan:  1.  Colonoscopy tomorrow (prior attempt couple weeks ago suboptimal prep). 2.  Supportive care with IVF, serial CBCs, blood transfusion as needed). 3.  Next step in management pending colonoscopy findings.   Landry Dyke 08/18/2019, 2:37 PM   Cell [phone] If no answer or after 5 PM call [phone]

## 2019-08-18 NOTE — Progress Notes (Signed)
PROGRESS NOTE    [name]  [id number] DOB: [birth year] DOA: 08/16/2019 PCP: Ann Held, DO   Brief Narrative: Sue [name] is a 68 y.o. female with medical history significant of DVT on Coumadin, chronic hypoxic respiratory failure on 3 L of oxygen at baseline, COPD, insulin-dependent diabetes mellitus, morbid obesity, CKD stage IIIb depression, anxiety who presented from her home today to the emergency department with complaints of  maroon-colored stools, passage of clots from her rectum.  She was just discharged here on 08/08/2019 when she was admitted for the evaluation of same problem.  At that time her hemoglobin was in the range of 4.6, she was transfused with a unit of PRBC, monitor therapy, given IV vitamin K.  RBC tagged scan was significant for active GI bleed in the right lower abdomen and she underwent colonoscopy with finding of red blood in the entire colon but could not find the source of active bleeding.She has not taken any Coumadin since discharge. Patient has remained hemodynamically stable.  Her hemoglobin is stable in the range of 8.  She does not have any bowel movement since she has been admitted.  GI following and planning for colonoscopy on Monday.  Assessment & Plan:   Active Problems:   Lower GI bleed   Hematochezia: Presented with maroon-colored stools, clots, blood on wipes.  Denies any abdominal pain, nausea or vomiting.  Has not taken any Coumadin since she was discharged. GI consulted.  Her hemoglobin is stable.  She is not having any active bleeding at present. We will continue to monitor H&H very closely.  If she bleeds actively, will consult IR for embolization.On soft diet. Colonoscopy on 08/05/2019 showed blood found in the entire colon with scattered small mouth diverticula, internal hemorrhoids, maroon-colored clots, poor visualization without finding of any active bleeding site.  She was transfused with a total of 5 units during her  hospitalization.  When she was discharged, her hemoglobin is stable at 8.6.  Recommended to hold Coumadin for 7 days. I do not think she needs to continue Coumadin anymore.  She developed DVT which was provoked due to her fracture of the left leg and it was started on June 2020. GI planning for colonoscopy on Monday.  CKD stage IIIb: Baseline creatinine fluctuating 1.5-2.5.  Today creatinine is in the range of 1.8.  It was 1.28 on discharge.  Patient had significant bilateral lower extremity edema on presentation.  I will continue torsemide and continue to monitor her kidney function.  She follows with her PCP.  Chronic hypoxic respiratory failure/history of COPD: Follows with pulmonology.  Uses 3 L of oxygen at home 24 hours.  Respiratory status stable at present.  Not in exacerbation.  Continue bronchodilators as needed.  Chronic diastolic CHF: Takes torsemide 40 mg daily at home.  Has bilateral lower extremity edema.  Looks mildly volume overloaded.  Has fine crackles on lung bases.  Echocardiogram done on 03/2017 showed his ejection residual [id number], grade 1 diastolic dysfunction.    Depression/anxiety: Continue buspirone, Ativan as needed, Seroquel  Pulmonary nodule: History of 5 mm right lower lobe pulmonary nodule.  Former smoker with a 40-year pack smoking history.  Repeat noncontrast chest CT in 12 months for follow-up.  Left lower extremity DVT: Provoked.  On June 2020.  Has been on Coumadin since then.  Will discontinue Coumadin on discharge  Morbid obesity: BMI of 48.89.         DVT prophylaxis:SCD Code Status: Full Family  Communication: None present at the bed side.Patient talking to family Disposition Plan: Home after full work up   Consultants: GI   Procedures:None  Antimicrobials:  Anti-infectives (From admission, onward)   None      Subjective:  Patient seen and examined at bedside this morning.  Hemodynamically stable.  Abdominal cramps have resolved  today.  Does not have a bowel movement yet.  Denies any abdominal pain, nausea or vomiting.  Objective: Vitals:   08/17/19 0534 08/17/19 1326 08/17/19 2040 08/18/19 0546  BP: (!) 148/90 (!) 145/97 (!) 142/79 (!) 128/56  Pulse: 96 (!) 102 87 92  Resp: 16 18 18 18   Temp: 98.3 F (36.8 C) 98.8 F (37.1 C) 97.8 F (36.6 C) 98.7 F (37.1 C)  TempSrc:  Oral Oral Oral  SpO2: 100% 98% 100% 100%  Weight:      Height:        Intake/Output Summary (Last 24 hours) at 08/18/2019 1134 Last data filed at 08/18/2019 1021 Gross per 24 hour  Intake 600 ml  Output 550 ml  Net 50 ml   Filed Weights   08/16/19 0931  Weight: 125.2 kg    Examination:  General exam: Not in distress,morbidly obese HEENT:PERRL,Oral mucosa moist, Ear/Nose normal on gross exam Respiratory system: Bilateral equal air entry, normal vesicular breath sounds, no wheezes or crackles  Cardiovascular system: S1 & S2 heard, RRR. No JVD, murmurs, rubs, gallops or clicks. Trace pedal edema. Gastrointestinal system: Abdomen is nondistended, soft and nontender. No organomegaly or masses felt. Normal bowel sounds heard. Central nervous system: Alert and oriented. No focal neurological deficits. Extremities:Trace bilateral lower extremity edema, no clubbing ,no cyanosis, distal peripheral pulses palpable. Skin: No rashes, lesions or ulcers,no icterus ,no pallor     Data Reviewed: I have personally reviewed following labs and imaging studies  CBC: Recent Labs  Lab 08/16/19 0951 08/17/19 0537 08/18/19 0623  WBC 9.6 7.6 8.1  NEUTROABS  --   --  3.4  HGB 9.0* 8.2* 7.8*  HCT 29.5* 26.5* 25.2*  MCV 93.4 93.0 94.4  PLT 300 250 [id number]   Basic Metabolic Panel: Recent Labs  Lab 08/16/19 0951 08/17/19 0537 08/18/19 0623  NA 141 141 138  K 3.0* 3.1* 4.6  CL 95* 95* 97*  CO2 29 32 29  GLUCOSE 127* 116* 163*  BUN 18 18 17   CREATININE 1.87* 1.65* 1.77*  CALCIUM 7.0* 7.0* 7.0*  MG  --  1.1*  --    GFR: Estimated  Creatinine Clearance: 39.7 mL/min (A) (by C-G formula based on SCr of 1.77 mg/dL (H)). Liver Function Tests: Recent Labs  Lab 08/16/19 0951  AST 27  ALT 13  ALKPHOS 115  BILITOT 0.8  PROT 8.2*  ALBUMIN 3.2*   No results for input(s): LIPASE, AMYLASE in the last 168 hours. No results for input(s): AMMONIA in the last 168 hours. Coagulation Profile: Recent Labs  Lab 08/16/19 0951  INR 1.0   Cardiac Enzymes: No results for input(s): CKTOTAL, CKMB, CKMBINDEX, TROPONINI in the last 168 hours. BNP (last 3 results) No results for input(s): PROBNP in the last 8760 hours. HbA1C: No results for input(s): HGBA1C in the last 72 hours. CBG: Recent Labs  Lab 08/17/19 0728 08/17/19 1113 08/17/19 1651 08/17/19 2043 08/18/19 0721  GLUCAP 100* 224* 177* 200* 139*   Lipid Profile: No results for input(s): CHOL, HDL, LDLCALC, TRIG, CHOLHDL, LDLDIRECT in the last 72 hours. Thyroid Function Tests: No results for input(s): TSH, T4TOTAL, FREET4, T3FREE, THYROIDAB in  the last 72 hours. Anemia Panel: No results for input(s): VITAMINB12, FOLATE, FERRITIN, TIBC, IRON, RETICCTPCT in the last 72 hours. Sepsis Labs: No results for input(s): PROCALCITON, LATICACIDVEN in the last 168 hours.  Recent Results (from the past 240 hour(s))  SARS CORONAVIRUS 2 (TAT 6-24 HRS) Nasopharyngeal Nasopharyngeal Swab     Status: None   Collection Time: 08/16/19 10:32 AM   Specimen: Nasopharyngeal Swab  Result Value Ref Range Status   SARS Coronavirus 2 NEGATIVE NEGATIVE Final    Comment: (NOTE) SARS-CoV-2 target nucleic acids are NOT DETECTED. The SARS-CoV-2 RNA is generally detectable in upper and lower respiratory specimens during the acute phase of infection. Negative results do not preclude SARS-CoV-2 infection, do not rule out co-infections with other pathogens, and should not be used as the sole basis for treatment or other patient management decisions. Negative results must be combined with clinical  observations, patient history, and epidemiological information. The expected result is Negative. Fact Sheet for Patients: SugarRoll.be Fact Sheet for Healthcare Providers: https://www.woods-mathews.com/ This test is not yet approved or cleared by the [place] FDA and  has been authorized for detection and/or diagnosis of SARS-CoV-2 by FDA under an Emergency Use Authorization (EUA). This EUA will remain  in effect (meaning this test can be used) for the duration of the COVID-19 declaration under Section 56 4(b)(1) of the Act, 21 U.S.C. section 360bbb-3(b)(1), unless the authorization is terminated or revoked sooner. Performed at [facility], [place] [address], [place], [place] [address]          Radiology Studies: No results found.      Scheduled Meds: . busPIRone  15 mg Oral Daily  . insulin aspart  0-15 Units Subcutaneous TID WC  . insulin aspart  0-5 Units Subcutaneous QHS  . levothyroxine  50 mcg Oral Daily  . metoprolol succinate  100 mg Oral Daily  . QUEtiapine  100 mg Oral QHS  . torsemide  40 mg Oral Daily  . zolpidem  5 mg Oral QHS   Continuous Infusions:   LOS: 0 days    Time spent: 25 mins.More than 50% of that time was spent in counseling and/or coordination of care.      Shelly Coss, MD [facility] Pager [phone]  If 7PM-7AM, please contact night-coverage [email or web address] Password [email or web address] 08/18/2019, 11:34 AM

## 2019-08-19 ENCOUNTER — Encounter (HOSPITAL_COMMUNITY): Payer: Self-pay | Admitting: *Deleted

## 2019-08-19 ENCOUNTER — Encounter (HOSPITAL_COMMUNITY)
Admission: EM | Disposition: A | Discharge status: Home / Self Care | Payer: Self-pay | Source: Home / Self Care | Attending: Internal Medicine

## 2019-08-19 ENCOUNTER — Encounter (HOSPITAL_COMMUNITY): Payer: Self-pay | Admitting: Anesthesiology

## 2019-08-19 ENCOUNTER — Inpatient Hospital Stay (HOSPITAL_COMMUNITY): Payer: Medicare HMO | Admitting: Anesthesiology

## 2019-08-19 ENCOUNTER — Inpatient Hospital Stay: Payer: Medicare HMO | Admitting: Family Medicine

## 2019-08-19 DIAGNOSIS — K921 Melena: Secondary | ICD-10-CM | POA: Diagnosis not present

## 2019-08-19 DIAGNOSIS — D62 Acute posthemorrhagic anemia: Secondary | ICD-10-CM | POA: Diagnosis not present

## 2019-08-19 DIAGNOSIS — K922 Gastrointestinal hemorrhage, unspecified: Secondary | ICD-10-CM | POA: Diagnosis not present

## 2019-08-19 HISTORY — PX: COLONOSCOPY: SHX5424

## 2019-08-19 LAB — CBC WITH DIFFERENTIAL/PLATELET
Abs Immature Granulocytes: 0.07 10*3/uL (ref 0.00–0.07)
Basophils Absolute: 0 10*3/uL (ref 0.0–0.1)
Basophils Relative: 1 %
Eosinophils Absolute: 0.5 10*3/uL (ref 0.0–0.5)
Eosinophils Relative: 7 %
HCT: 28 % — ABNORMAL LOW (ref 36.0–46.0)
Hemoglobin: 8.5 g/dL — ABNORMAL LOW (ref 12.0–15.0)
Immature Granulocytes: 1 %
Lymphocytes Relative: 40 %
Lymphs Abs: 3 10*3/uL (ref 0.7–4.0)
MCH: 28.8 pg (ref 26.0–34.0)
MCHC: 30.4 g/dL (ref 30.0–36.0)
MCV: 94.9 fL (ref 80.0–100.0)
Monocytes Absolute: 0.6 10*3/uL (ref 0.1–1.0)
Monocytes Relative: 7 %
Neutro Abs: 3.4 10*3/uL (ref 1.7–7.7)
Neutrophils Relative %: 44 %
Platelets: 280 10*3/uL (ref 150–400)
RBC: 2.95 MIL/uL — ABNORMAL LOW (ref 3.87–5.11)
RDW: 14.8 % (ref 11.5–15.5)
WBC: 7.5 10*3/uL (ref 4.0–10.5)
nRBC: 0 % (ref 0.0–0.2)

## 2019-08-19 LAB — GLUCOSE, CAPILLARY
Glucose-Capillary: 114 mg/dL — ABNORMAL HIGH (ref 70–99)
Glucose-Capillary: 122 mg/dL — ABNORMAL HIGH (ref 70–99)

## 2019-08-19 SURGERY — COLONOSCOPY WITH PROPOFOL
Anesthesia: Monitor Anesthesia Care | Laterality: Left

## 2019-08-19 SURGERY — COLONOSCOPY
Anesthesia: Monitor Anesthesia Care

## 2019-08-19 MED ORDER — PROPOFOL 500 MG/50ML IV EMUL
INTRAVENOUS | Status: AC
  Filled 2019-08-19: qty 50

## 2019-08-19 MED ORDER — ALBUTEROL SULFATE (2.5 MG/3ML) 0.083% IN NEBU
2.5000 mg | INHALATION_SOLUTION | Freq: Once | RESPIRATORY_TRACT | Status: AC
  Administered 2019-08-19: 2.5 mg via RESPIRATORY_TRACT

## 2019-08-19 MED ORDER — PHENYLEPHRINE 40 MCG/ML (10ML) SYRINGE FOR IV PUSH (FOR BLOOD PRESSURE SUPPORT)
PREFILLED_SYRINGE | INTRAVENOUS | Status: DC | PRN
  Administered 2019-08-19 (×2): 80 ug via INTRAVENOUS

## 2019-08-19 MED ORDER — LIDOCAINE 2% (20 MG/ML) 5 ML SYRINGE
INTRAMUSCULAR | Status: DC | PRN
  Administered 2019-08-19: 100 mg via INTRAVENOUS

## 2019-08-19 MED ORDER — PROPOFOL 10 MG/ML IV BOLUS
INTRAVENOUS | Status: AC
  Filled 2019-08-19: qty 20

## 2019-08-19 MED ORDER — SODIUM CHLORIDE 0.9 % IV SOLN
INTRAVENOUS | Status: DC
  Administered 2019-08-19: 04:00:00 via INTRAVENOUS

## 2019-08-19 MED ORDER — PROPOFOL 10 MG/ML IV BOLUS
INTRAVENOUS | Status: DC | PRN
  Administered 2019-08-19 (×2): 20 mg via INTRAVENOUS

## 2019-08-19 MED ORDER — ALBUTEROL SULFATE (2.5 MG/3ML) 0.083% IN NEBU
INHALATION_SOLUTION | RESPIRATORY_TRACT | Status: AC
  Filled 2019-08-19: qty 3

## 2019-08-19 MED ORDER — LACTATED RINGERS IV SOLN
INTRAVENOUS | Status: DC | PRN
  Administered 2019-08-19: 10:00:00 via INTRAVENOUS

## 2019-08-19 MED ORDER — PROPOFOL 500 MG/50ML IV EMUL
INTRAVENOUS | Status: DC | PRN
  Administered 2019-08-19: 100 ug/kg/min via INTRAVENOUS

## 2019-08-19 SURGICAL SUPPLY — 21 items

## 2019-08-19 NOTE — Progress Notes (Signed)
Sue [name] 9:39 AM  Subjective: Patient seen and examined and case discussed with my partner Dr. Paulita Fujita in her hospital computer chart reviewed and she is not having any further bleeding and we answered all of her questions  Objective: Vital signs stable afebrile no acute distress exam please see preassessment evaluation hemoglobin stable Assessment: Lower GI bleeding questionable diverticuli  Plan: Okay to proceed with colonoscopy with anesthesia assistance  [facility]  office [phone] After 5PM or if no answer call [phone]

## 2019-08-19 NOTE — Anesthesia Procedure Notes (Signed)
Date/Time: 08/19/2019 9:46 AM Performed by: Sharlette Dense, CRNA Oxygen Delivery Method: Simple face mask

## 2019-08-19 NOTE — Transfer of Care (Signed)
Immediate Anesthesia Transfer of Care Note  Patient: Sue [name]  Procedure(s) Performed: COLONOSCOPY (N/A )  Patient Location: Endoscopy Unit  Anesthesia Type:MAC  Level of Consciousness: awake, alert  and oriented  Airway & Oxygen Therapy: Patient Spontanous Breathing and Patient connected to face mask oxygen  Post-op Assessment: Report given to RN and Post -op Vital signs reviewed and stable  Post vital signs: Reviewed and stable  Last Vitals:  Vitals Value Taken Time  BP 151/70 08/19/19 1028  Temp    Pulse 88 08/19/19 1029  Resp 19 08/19/19 1029  SpO2 100 % 08/19/19 1029  Vitals shown include unvalidated device data.  Last Pain:  Vitals:   08/19/19 0918  TempSrc: Oral  PainSc: 0-No pain      Patients Stated Pain Goal: 2 (17/40/81 4481)  Complications: No apparent anesthesia complications

## 2019-08-19 NOTE — Discharge Summary (Signed)
Physician Discharge Summary  Sue [name] GTX:[id number] DOB: 1951-06-04 DOA: 08/16/2019  PCP: Sue [name]  Admit date: 08/16/2019 Discharge date: 08/19/2019  Admitted From: Home Disposition:  Home  Discharge Condition:Stable CODE STATUS:FULL Diet recommendation: Heart Healthy  Brief/Interim Summary:  Sue Robley Hammondis a 68 y.o.femalewith medical history significant ofDVT on Coumadin, chronic hypoxic respiratory failure on 3 L of oxygen at baseline, COPD, insulin-dependent diabetes mellitus, morbid obesity, CKD stage IIIb depression, anxiety who presented from her home today to the emergency department with complaints of maroon-colored stools,passageof clots from her rectum. She was just discharged here on 08/08/2019 when she was admitted for the evaluation of same problem. At that time her hemoglobin was in the range of 4.6, she was transfused with a unit of PRBC, monitor therapy, given IV vitamin K. RBC tagged scan was significant for active GI bleed in the right lower abdomen and she underwent colonoscopy with finding of red blood in the entire colon but could not find the source of active bleeding.She has not taken any Coumadin since discharge. Patient has remained hemodynamically stable.  Her hemoglobin is stable in the range of 8.  She does not have any bowel movement since she has been admitted.  GI was following and she underwent colonoscopy today.  No evidence active bleeding.  Diverticular bleed suspected.  She is hemodynamically stable for discharge home today.  Following problems were addressed during her  hospitalization:  Hematochezia:  GI was following and she underwent colonoscopy today.  No evidence active bleeding.  Diverticular bleed suspected.   Presented with maroon-colored stools, clots, blood on wipes. Denies any abdominal pain, nausea or vomiting. Has not taken any Coumadin since she was discharged. GI consulted. Her hemoglobin is stable. She  is not having any active bleeding at present. We will continue to monitor H&H very closely. If she bleeds actively, will consult IR for embolization.On soft diet. Colonoscopy on 08/05/2019 showed blood found in the entire colon with scattered small mouth diverticula, internal hemorrhoids, maroon-colored clots, poor visualization without finding of any active bleeding site. She was transfused with a total of 5 units during her hospitalization. When she was discharged,her hemoglobin is stable at 8.6. Recommended to hold Coumadin for 7 days. I [name] not think she needs to continue Coumadin anymore.Shedeveloped DVT which was provoked due to her fracture of the left leg and it was started on June 2020.  CKD stage IIIb: Baseline creatinine fluctuating 1.5-2.5. Today creatinine is in the range of 1.8. It was 1.28 on discharge. Patient had significant bilateral lower extremity edema on presentation. I will continue torsemide and continue to monitor her kidney function. She follows with her PCP.  Chronic hypoxic respiratory failure/history of COPD: Follows with pulmonology. Uses 3 L of oxygen at home 24 hours. Respiratory status stable at present. Not in exacerbation.   Chronic diastolic CHF: Takes torsemide 40 mg daily at home. Has bilateral lower extremity edema. Looks mildly volume overloaded. Has fine crackles on lung bases. Echocardiogram done on 03/2017 showed his ejection residual 24%, grade 1 diastolic dysfunction.   Depression/anxiety:Continue buspirone, Ativan as needed, Seroquel  Pulmonary nodule: History of 5 mm right lower lobe pulmonarynodule. Former smoker with a 40-year pack smoking history. Repeat noncontrast chest CT in 12 months for follow-up.  Left lower extremity DVT: Provoked. On June 2020. Has been on Coumadin since then. Will discontinue Coumadin on discharge  Morbid obesity: BMI of 48.89.  Discharge Diagnoses:  Active Problems:   Lower GI  bleed    Discharge Instructions  Discharge Instructions    Diet - low sodium heart healthy   Complete by: As directed    Discharge instructions   Complete by: As directed    1)Please follow up with your PCP in week. [name] a CBC test during the follow up. We have recommended to stop Coumadin permanently.  Please discuss with your PCP about this during the follow-up.   Increase activity slowly   Complete by: As directed      Allergies as of 08/19/2019      Reactions   Hydrocodone Other (See Comments)   Constipation, hallucinations   Norvasc [amlodipine Besylate]    Marked swelling      Medication List    STOP taking these medications   warfarin 2.5 MG tablet Commonly known as: Coumadin   warfarin 5 MG tablet Commonly known as: COUMADIN     TAKE these medications   Accu-Chek FastClix Lancets Misc USE TO CHECK BLOOD SUGAR UP TO FOUR TIMES DAILY AS DIRECTED   Accu-Chek Guide test strip Generic drug: glucose blood USE TO CHECK BLOOD SUGAR UP TO FOUR TIMES DAILY   albuterol 108 (90 Base) MCG/ACT inhaler Commonly known as: VENTOLIN HFA Inhale 1-2 puffs into the lungs every 6 (six) hours as needed for wheezing or shortness of breath.   albuterol (2.5 MG/3ML) 0.083% nebulizer solution Commonly known as: PROVENTIL Take 3 mLs (2.5 mg total) by nebulization every 6 (six) hours as needed for wheezing or shortness of breath.   blood glucose meter kit and supplies Kit Dispense based on patient and insurance preference. Use up to four times daily as directed. (FOR ICD-9 250.00, 250.01).   busPIRone 15 MG tablet Commonly known as: BUSPAR Take 15 mg by mouth daily.   famotidine 20 MG tablet Commonly known as: PEPCID Take 20 mg by mouth daily as needed for heartburn or indigestion.   fluticasone 50 MCG/ACT nasal spray Commonly known as: FLONASE Place 2 sprays into both nostrils daily as needed for allergies.   glipiZIDE 5 MG tablet Commonly known as: GLUCOTROL Take 1  tablet (5 mg total) by mouth 2 (two) times daily before a meal.   insulin glargine 100 UNIT/ML injection Commonly known as: LANTUS Inject 0.3 mLs (30 Units total) into the skin at bedtime.   Insulin Syringe-Needle U-100 28G X 5/16" 0.5 ML Misc Use with lantus once a day   levothyroxine 50 MCG tablet Commonly known as: SYNTHROID Take 1 tablet by mouth once daily   lisinopril 20 MG tablet Commonly known as: ZESTRIL Take 1 tablet by mouth once daily   LORazepam 1 MG tablet Commonly known as: ATIVAN Take 0.5-1.5 mg by mouth See admin instructions. Take 0.44m by mouth in the morning and 1.510mby mouth at bedtime as needed for anxiety.   metFORMIN 500 MG tablet Commonly known as: Glucophage Take 2 tablets (1,000 mg total) by mouth 2 (two) times daily with a meal. What changed: when to take this   metoprolol succinate 50 MG 24 hr tablet Commonly known as: TOPROL-XL TAKE 2 TABLETS BY MOUTH ONCE DAILY IN THE MORNING WITH A MEAL OR IMMEDIATELY FOLLOWING A MEAL What changed:   how much to take  how to take this  when to take this  additional instructions   NONFORMULARY OR COMPOUNDED ITEM Pt/ inr   Dx dvt   Tomorrow 04/05/2019  Please call office [phone] results   NONFORMULARY OR COMPOUNDED ITEM PT/ inr    Dx  dvt   NONFORMULARY OR COMPOUNDED ITEM Compression stockings  20-30 mm/hg  #1   Dx low ext edema   OXYGEN Inhale 3 L into the lungs continuous.   potassium chloride SA 20 MEQ tablet Commonly known as: KLOR-CON Take 1 tablet by mouth once daily   QUEtiapine 100 MG tablet Commonly known as: SEROQUEL Take 100 mg by mouth at bedtime.   torsemide 20 MG tablet Commonly known as: DEMADEX Take 2 tablets by mouth once daily   zolpidem 10 MG tablet Commonly known as: AMBIEN Take 10 mg by mouth at bedtime.      Follow-up Information    Sue [name]. Schedule an appointment as soon as possible for a visit in 1 week(s).   Specialty: Family  Medicine Contact information: [phone] [address] [address] [phone]          Allergies  Allergen Reactions  . Hydrocodone Other (See Comments)    Constipation, hallucinations  . Norvasc [Amlodipine Besylate]     Marked swelling    Consultations:  GI   Procedures/Studies: Dg Chest 2 View  Result Date: 08/02/2019 CLINICAL DATA:  Shortness of breath. EXAM: CHEST - 2 VIEW COMPARISON:  03/11/2019 FINDINGS: Both views are degraded by patient body habitus. Lateral view degraded by patient arm position. Numerous leads and wires project over the chest. Midline trachea. Normal heart size. Atherosclerosis in the transverse aorta. No pleural effusion or pneumothorax. No congestive failure. Clear lungs. IMPRESSION: No acute cardiopulmonary disease. Aortic Atherosclerosis (ICD10-I70.0). Electronically Signed   By: Abigail Miyamoto M.D.   On: 08/02/2019 18:26   Nm Gi Blood Loss  Result Date: 08/03/2019 CLINICAL DATA:  GI bleed. EXAM: NUCLEAR MEDICINE GASTROINTESTINAL BLEEDING SCAN TECHNIQUE: Sequential abdominal images were obtained following intravenous administration of Tc-41mlabeled red blood cells. RADIOPHARMACEUTICALS:  22.2 mCi Tc-971mertechnetate in-vitro labeled red cells. COMPARISON:  CT yesterday. FINDINGS: Vague focal radiotracer accumulation initially in the central lower abdomen courses to the right of the level of the iliac vessels, beginning at minute 22 on first hour imaging. There is to and fro activity which then dissipates. During second hour imaging focal activity again accumulates just to the right of the iliac vessels in the similar location, and courses centrally., activity than again dissipates. IMPRESSION: Vague radiotracer accumulation in the central right lower abdomen with slight to and fro motion that appears intermittently and dissipates over the course of 2 hours. Findings suggest intermittent GI bleed. It is unclear if this is within tortuous sigmoid  colon versus small bowel. These results will be called to the ordering clinician or representative by the Radiologist Assistant, and communication documented in the PACS or zVision Dashboard. Electronically Signed   By: MeKeith Rake.D.   On: 08/03/2019 03:28   Ct Abdomen Pelvis W Contrast  Result Date: 08/02/2019 CLINICAL DATA:  Abdominal pain, rectal bleeding EXAM: CT ABDOMEN AND PELVIS WITH CONTRAST TECHNIQUE: Multidetector CT imaging of the abdomen and pelvis was performed using the standard protocol following bolus administration of intravenous contrast. CONTRAST:  10067mMNIPAQUE IOHEXOL 300 MG/ML  SOLN COMPARISON:  03/08/2019 FINDINGS: Lower chest: Incompletely visualized 5 mm nodular density within the superior aspect of the right lower lobe (series 5, image 1). Lung bases otherwise clear. Hepatobiliary: No focal liver abnormality is seen. Multiple calcified stones within the gallbladder lumen. No evidence of gallbladder wall thickening, pericholecystic inflammation, or biliary dilatation. Pancreas: Unremarkable. No pancreatic ductal dilatation or surrounding inflammatory changes. Spleen: Normal in size without  focal abnormality. Adrenals/Urinary Tract: Adrenal glands are unremarkable. Kidneys are normal, without renal calculi, focal lesion, or hydronephrosis. Bladder is unremarkable. Stomach/Bowel: Stomach is within normal limits. Appendix appears normal. There are a few scattered diverticula of the sigmoid colon. No evidence of bowel wall thickening, distention, or inflammatory changes. Vascular/Lymphatic: Aortic atherosclerosis. Incidental note of retroaortic left renal vein. No enlarged abdominal or pelvic lymph nodes. Reproductive: Status post hysterectomy. No adnexal masses. Other: No free fluid within the abdomen or pelvis. Mild rectus diastasis. Musculoskeletal: No acute osseous abnormality. Chronic grade 1 anterolisthesis L4 on L5. IMPRESSION: 1. No acute abdominopelvic findings. 2.  Uncomplicated cholelithiasis. 3. Mild sigmoid diverticulosis without acute diverticulitis. 4. Partially visualized 5 mm right lower lobe pulmonary nodule. No follow-up needed if patient is low-risk. Non-contrast chest CT can be considered in 12 months if patient is high-risk. This recommendation follows the consensus statement: Guidelines for Management of Incidental Pulmonary Nodules Detected on CT Images: From the Fleischner Society 2017; Radiology 2017; 284:228-243. Aortic Atherosclerosis (ICD10-I70.0). Electronically Signed   By: Davina Poke M.D.   On: 08/02/2019 21:32       Subjective:  Patient seen and examined the bedside this morning. Comfortable.  Hemodynamically stable for discharge today. Discharge Exam: Vitals:   08/19/19 1124 08/19/19 1125  BP: 135/81   Pulse: 89 91  Resp: 18   Temp: 98.3 F (36.8 C)   SpO2:  98%   Vitals:   08/19/19 1028 08/19/19 1040 08/19/19 1124 08/19/19 1125  BP: (!) 151/70 (!) 145/77 135/81   Pulse: 91 86 89 91  Resp: '14 17 18   ' Temp:   98.3 F (36.8 C)   TempSrc:   Oral   SpO2: 100% 100%  98%  Weight:      Height:        General: Pt is alert, awake, not in acute distress Cardiovascular: RRR, S1/S2 +, no rubs, no gallops Respiratory: CTA bilaterally, no wheezing, no rhonchi Abdominal: Soft, NT, ND, bowel sounds + Extremities: no edema, no cyanosis    The results of significant diagnostics from this hospitalization (including imaging, microbiology, ancillary and laboratory) are listed below for reference.     Microbiology: Recent Results (from the past 240 hour(s))  SARS CORONAVIRUS 2 (TAT 6-24 HRS) Nasopharyngeal Nasopharyngeal Swab     Status: None   Collection Time: 08/16/19 10:32 AM   Specimen: Nasopharyngeal Swab  Result Value Ref Range Status   SARS Coronavirus 2 NEGATIVE NEGATIVE Final    Comment: (NOTE) SARS-CoV-2 target nucleic acids are NOT DETECTED. The SARS-CoV-2 RNA is generally detectable in upper and  lower respiratory specimens during the acute phase of infection. Negative results [name] not preclude SARS-CoV-2 infection, [name] not rule out co-infections with other pathogens, and should not be used as the sole basis for treatment or other patient management decisions. Negative results must be combined with clinical observations, patient history, and epidemiological information. The expected result is Negative. Fact Sheet for Patients: SugarRoll.be Fact Sheet for Healthcare Providers: https://www.woods-mathews.com/ This test is not yet approved or cleared by the [place] FDA and  has been authorized for detection and/or diagnosis of SARS-CoV-2 by FDA under an Emergency Use Authorization (EUA). This EUA will remain  in effect (meaning this test can be used) for the duration of the COVID-19 declaration under Section 56 4(b)(1) of the Act, 21 U.S.C. section 360bbb-3(b)(1), unless the authorization is terminated or revoked sooner. Performed at [facility], [place] [address] [place], [place] [address]  Labs: BNP (last 3 results) No results for input(s): BNP in the last 8760 hours. Basic Metabolic Panel: Recent Labs  Lab 08/16/19 0951 08/17/19 0537 08/18/19 0623  NA 141 141 138  K 3.0* 3.1* 4.6  CL 95* 95* 97*  CO2 29 32 29  GLUCOSE 127* 116* 163*  BUN '18 18 17  ' CREATININE 1.87* 1.65* 1.77*  CALCIUM 7.0* 7.0* 7.0*  MG  --  1.1*  --    Liver Function Tests: Recent Labs  Lab 08/16/19 0951  AST 27  ALT 13  ALKPHOS 115  BILITOT 0.8  PROT 8.2*  ALBUMIN 3.2*   No results for input(s): LIPASE, AMYLASE in the last 168 hours. No results for input(s): AMMONIA in the last 168 hours. CBC: Recent Labs  Lab 08/16/19 0951 08/17/19 0537 08/18/19 0623 08/19/19 0537  WBC 9.6 7.6 8.1 7.5  NEUTROABS  --   --  3.4 3.4  HGB 9.0* 8.2* 7.8* 8.5*  HCT 29.5* 26.5* 25.2* 28.0*  MCV 93.4 93.0 94.4 94.9  PLT 300 250 224 280    Cardiac Enzymes: No results for input(s): CKTOTAL, CKMB, CKMBINDEX, TROPONINI in the last 168 hours. BNP: Invalid input(s): POCBNP CBG: Recent Labs  Lab 08/18/19 1138 08/18/19 1629 08/18/19 2043 08/19/19 0746 08/19/19 1133  GLUCAP 161* 176* 151* 114* 122*   D-Dimer No results for input(s): DDIMER in the last 72 hours. Hgb A1c No results for input(s): HGBA1C in the last 72 hours. Lipid Profile No results for input(s): CHOL, HDL, LDLCALC, TRIG, CHOLHDL, LDLDIRECT in the last 72 hours. Thyroid function studies No results for input(s): TSH, T4TOTAL, T3FREE, THYROIDAB in the last 72 hours.  Invalid input(s): FREET3 Anemia work up No results for input(s): VITAMINB12, FOLATE, FERRITIN, TIBC, IRON, RETICCTPCT in the last 72 hours. Urinalysis    Component Value Date/Time   COLORURINE YELLOW 08/15/2018 2201   APPEARANCEUR CLEAR 08/15/2018 2201   LABSPEC <1.005 (L) 08/15/2018 2201   PHURINE 5.5 08/15/2018 2201   GLUCOSEU >=500 (A) 08/15/2018 2201   GLUCOSEU NEGATIVE 11/11/2013 1353   HGBUR NEGATIVE 08/15/2018 2201   BILIRUBINUR NEGATIVE 08/15/2018 2201   BILIRUBINUR neg 10/27/2015 1355   KETONESUR NEGATIVE 08/15/2018 2201   PROTEINUR NEGATIVE 08/15/2018 2201   UROBILINOGEN 0.2 10/27/2015 1355   UROBILINOGEN 0.2 07/29/2014 2300   NITRITE NEGATIVE 08/15/2018 2201   LEUKOCYTESUR NEGATIVE 08/15/2018 2201   Sepsis Labs Invalid input(s): PROCALCITONIN,  WBC,  LACTICIDVEN Microbiology Recent Results (from the past 240 hour(s))  SARS CORONAVIRUS 2 (TAT 6-24 HRS) Nasopharyngeal Nasopharyngeal Swab     Status: None   Collection Time: 08/16/19 10:32 AM   Specimen: Nasopharyngeal Swab  Result Value Ref Range Status   SARS Coronavirus 2 NEGATIVE NEGATIVE Final    Comment: (NOTE) SARS-CoV-2 target nucleic acids are NOT DETECTED. The SARS-CoV-2 RNA is generally detectable in upper and lower respiratory specimens during the acute phase of infection. Negative results [name] not preclude  SARS-CoV-2 infection, [name] not rule out co-infections with other pathogens, and should not be used as the sole basis for treatment or other patient management decisions. Negative results must be combined with clinical observations, patient history, and epidemiological information. The expected result is Negative. Fact Sheet for Patients: SugarRoll.be Fact Sheet for Healthcare Providers: https://www.woods-mathews.com/ This test is not yet approved or cleared by the [place] FDA and  has been authorized for detection and/or diagnosis of SARS-CoV-2 by FDA under an Emergency Use Authorization (EUA). This EUA will remain  in effect (meaning this test can  be used) for the duration of the COVID-19 declaration under Section 56 4(b)(1) of the Act, 21 U.S.C. section 360bbb-3(b)(1), unless the authorization is terminated or revoked sooner. Performed at [facility], [place] [address] [place], [place] [address]     Please note: You were cared for by a hospitalist during your hospital stay. Once you are discharged, your primary care physician will handle any further medical issues. Please note that NO REFILLS for any discharge medications will be authorized once you are discharged, as it is imperative that you return to your primary care physician (or establish a relationship with a primary care physician if you [name] not have one) for your post hospital discharge needs so that they can reassess your need for medications and monitor your lab values.    Time coordinating discharge: 40 minutes  SIGNED:   Shelly Coss, MD  [facility] 08/19/2019, 11:53 AM Pager [phone]  If 7PM-7AM, please contact night-coverage [email or web address] Password [email or web address]

## 2019-08-19 NOTE — Anesthesia Preprocedure Evaluation (Addendum)
Anesthesia Evaluation  Patient identified by MRN, date of birth, ID band Patient awake    Reviewed: Allergy & Precautions, NPO status , Patient's Chart, lab work & pertinent test results  Airway Mallampati: I  TM Distance: >3 FB Neck ROM: Full    Dental  (+) Partial Upper, Dental Advisory Given   Pulmonary COPD,  COPD inhaler and oxygen dependent, former smoker,     + decreased breath sounds      Cardiovascular hypertension, Pt. on medications and Pt. on home beta blockers + DOE   Rhythm:Regular Rate:Normal     Neuro/Psych PSYCHIATRIC DISORDERS Anxiety Depression TIACVA    GI/Hepatic GERD  ,(+) Hepatitis -  Endo/Other  diabetes, Type 2, Insulin Dependent, Oral Hypoglycemic AgentsHypothyroidism   Renal/GU Renal InsufficiencyRenal disease     Musculoskeletal   Abdominal (+) + obese,   Peds  Hematology   Anesthesia Other Findings   Reproductive/Obstetrics                           Anesthesia Physical Anesthesia Plan  ASA: III  Anesthesia Plan: MAC   Post-op Pain Management:    Induction: Intravenous  PONV Risk Score and Plan: 0 and Propofol infusion  Airway Management Planned: Natural Airway and Simple Face Mask  Additional Equipment: None  Intra-op Plan:   Post-operative Plan:   Informed Consent: I have reviewed the patients History and Physical, chart, labs and discussed the procedure including the risks, benefits and alternatives for the proposed anesthesia with the patient or authorized representative who has indicated his/her understanding and acceptance.       Plan Discussed with: CRNA  Anesthesia Plan Comments:        Anesthesia Quick Evaluation

## 2019-08-19 NOTE — Anesthesia Postprocedure Evaluation (Signed)
Anesthesia Post Note  Patient: Sue [name]  Procedure(s) Performed: COLONOSCOPY (N/A )     Patient location during evaluation: PACU Anesthesia Type: MAC Level of consciousness: awake and alert Pain management: pain level controlled Vital Signs Assessment: post-procedure vital signs reviewed and stable Respiratory status: spontaneous breathing, nonlabored ventilation, respiratory function stable and patient connected to nasal cannula oxygen Cardiovascular status: stable and blood pressure returned to baseline Postop Assessment: no apparent nausea or vomiting Anesthetic complications: no    Last Vitals:  Vitals:   08/19/19 1028 08/19/19 1040  BP: (!) 151/70 (!) 145/77  Pulse: 91 86  Resp: 14 17  Temp:    SpO2: 100% 100%    Last Pain:  Vitals:   08/19/19 1040  TempSrc:   PainSc: 0-No pain                 Effie Berkshire

## 2019-08-19 NOTE — Op Note (Addendum)
[facility] Patient Name: Sue [name] Procedure Date: 08/19/2019 MRN: [id number] Attending MD: Clarene Essex , MD Date of Birth: July 27, 1951 CSN: [id number] Age: 68 Admit Type: Inpatient Procedure:                Colonoscopy Indications:              Hematochezia Providers:                Clarene Essex, MD, Cleda Daub, RN, Lina Sar,                            Technician, Danley Danker, CRNA Referring MD:              Medicines:                Propofol total dose 290 mg IV, 100 mg IV lidocaine Complications:            No immediate complications. Estimated Blood Loss:     Estimated blood loss: none. Procedure:                Pre-Anesthesia Assessment:                           - Prior to the procedure, a History and Physical                            was performed, and patient medications and                            allergies were reviewed. The patient's tolerance of                            previous anesthesia was also reviewed. The risks                            and benefits of the procedure and the sedation                            options and risks were discussed with the patient.                            All questions were answered, and informed consent                            was obtained. Prior Anticoagulants: The patient has                            taken Coumadin (warfarin), last dose was 12 days                            prior to procedure. ASA Grade Assessment: III - A                            patient with severe systemic disease. After  reviewing the risks and benefits, the patient was                            deemed in satisfactory condition to undergo the                            procedure.                           After obtaining informed consent, the colonoscope                            was passed under direct vision. Throughout the                            procedure, the patient's blood pressure,  pulse, and                            oxygen saturations were monitored continuously. The                            [id number] [id number]) Olympus colonoscope was                            introduced through the anus and advanced to the the                            cecum, identified by appendiceal orifice and                            ileocecal valve. The ileocecal valve, appendiceal                            orifice, and rectum were photographed. The                            colonoscopy was performed without difficulty. The                            patient tolerated the procedure well. The quality                            of the bowel preparation was adequate. Scope In: 10:00:05 AM Scope Out: 10:18:16 AM Scope Withdrawal Time: 0 hours 12 minutes 57 seconds  Total Procedure Duration: 0 hours 18 minutes 11 seconds  Findings:      A few small-mouthed diverticula were found in the sigmoid colon.      The exam was otherwise without abnormality. And no blood was seen       throughout the procedure Impression:               - Diverticulosis in the sigmoid colon.                           - The examination was otherwise normal.                           -  No specimens collected. Moderate Sedation:      Not Applicable - Patient had care per Anesthesia. Recommendation:           - Soft diet today.                           - Continue present medications. Reevaluate blood                            thinner needs but okay to resume                           - Repeat colonoscopy in 5-10 years for screening                            purposes.                           - Return to GI office PRN.                           - Telephone GI clinic if symptomatic PRN. Procedure Code(s):        --- Professional ---                           [phone], Colonoscopy, flexible; diagnostic, including                            collection of specimen(s) by brushing or washing,                             when performed (separate procedure) Diagnosis Code(s):        --- Professional ---                           K92.1, Melena (includes Hematochezia)                           K57.30, Diverticulosis of large intestine without                            perforation or abscess without bleeding CPT copyright 2019 American Medical Association. All rights reserved. The codes documented in this report are preliminary and upon coder review may  be revised to meet current compliance requirements. Clarene Essex, MD 08/19/2019 10:26:14 AM This report has been signed electronically. Number of Addenda: 0

## 2019-08-19 NOTE — Telephone Encounter (Signed)
Reviewing pt chart. Pt is currently back in the hospital.

## 2019-08-20 ENCOUNTER — Telehealth: Payer: Self-pay | Admitting: *Deleted

## 2019-08-20 DIAGNOSIS — J9601 Acute respiratory failure with hypoxia: Secondary | ICD-10-CM | POA: Diagnosis not present

## 2019-08-20 DIAGNOSIS — J439 Emphysema, unspecified: Secondary | ICD-10-CM | POA: Diagnosis not present

## 2019-08-20 DIAGNOSIS — J449 Chronic obstructive pulmonary disease, unspecified: Secondary | ICD-10-CM | POA: Diagnosis not present

## 2019-08-20 NOTE — Telephone Encounter (Signed)
LVM for pt to return call to office.

## 2019-08-21 ENCOUNTER — Encounter: Payer: Self-pay | Admitting: Family Medicine

## 2019-08-21 ENCOUNTER — Inpatient Hospital Stay: Payer: Self-pay | Admitting: Family Medicine

## 2019-08-21 ENCOUNTER — Other Ambulatory Visit: Payer: Self-pay

## 2019-08-21 ENCOUNTER — Ambulatory Visit (INDEPENDENT_AMBULATORY_CARE_PROVIDER_SITE_OTHER): Payer: Medicare HMO | Admitting: Family Medicine

## 2019-08-21 DIAGNOSIS — D649 Anemia, unspecified: Secondary | ICD-10-CM

## 2019-08-21 DIAGNOSIS — K921 Melena: Secondary | ICD-10-CM | POA: Diagnosis not present

## 2019-08-21 DIAGNOSIS — R42 Dizziness and giddiness: Secondary | ICD-10-CM

## 2019-08-21 LAB — CBC
HCT: 24.5 % — ABNORMAL LOW (ref 35.0–45.0)
Hemoglobin: 7.6 g/dL — ABNORMAL LOW (ref 11.7–15.5)
MCH: 28.4 pg (ref 27.0–33.0)
MCHC: 31 g/dL — ABNORMAL LOW (ref 32.0–36.0)
MCV: 91.4 fL (ref 80.0–100.0)
MPV: 10.5 fL (ref 7.5–12.5)
Platelets: 272 10*3/uL (ref 140–400)
RBC: 2.68 10*6/uL — ABNORMAL LOW (ref 3.80–5.10)
RDW: 14.1 % (ref 11.0–15.0)
WBC: 9.8 10*3/uL (ref 3.8–10.8)

## 2019-08-21 NOTE — Telephone Encounter (Signed)
Transition Care Management Follow-up Telephone Call   Date discharged? 08/19/19   How have you been since you were released from the hospital? Doing well. Just having a little stomach discomfort.    Do you understand why you were in the hospital? Yes. For a GI bleed   Do you understand the discharge instructions? yes   Where were you discharged to? home   Items Reviewed:  Medications reviewed:pt states she will discuss with Dr.Wendling at appt today  Allergies reviewed: yes  Dietary changes reviewed: yes  Referrals reviewed: yes   Functional Questionnaire:   Activities of Daily Living (ADLs):   She states they are independent in the following: ambulation, bathing and hygiene, feeding, continence, grooming, toileting and dressing States they require assistance with the following: na. Uses rolling walker sometimes and O2.   Any transportation issues/concerns?: no   Any patient concerns? yes, mild stomach discomfort and diarrhea. Will discuss at appt today.   Confirmed importance and date/time of follow-up visits scheduled yes  Provider Appointment booked with Dr.Wendling @245  today.  Confirmed with patient if condition begins to worsen call PCP or go to the ER.  Patient was given the office number and encouraged to call back with question or concerns.  : yes

## 2019-08-21 NOTE — Addendum Note (Signed)
Addended by: Caffie Pinto on: 08/21/2019 03:53 PM   Modules accepted: Orders

## 2019-08-21 NOTE — Progress Notes (Signed)
Chief Complaint  Patient presents with  . Hospitalization Follow-up    HPI Sue [name] is a 68 y.o. y.o. female who presents for a transition of care visit.  Pt was discharged from [facility] on 08/19/2019.  Within 48 business hours of discharge our office contacted pt via telephone to coordinate care and needs. Due to COVID-19 pandemic, we are interacting via web portal for an electronic face-to-face visit. I verified patient's ID using 2 identifiers. Patient agreed to proceed with visit via this method. Patient is at home, I am at office. Patient, her daughter and I are present for visit.    The patient was readmitted on 08/16/2019 for a GI bleed.  She had been on Coumadin for provoked DVT in January of this year.  It is currently being held.  She had a colonoscopy in the hospital.  After hemodynamic stability confirmed, she was discharged home.  At her initial hospitalization, her hemoglobin was 4.6.  She received 6 units of packed red blood cells.  At her most recent hospitalization, the lowest her hemoglobin was 7.8.  2 days ago when she was discharged, the patient reports having no blood in her stool and her blood pressure was normal.  She has not taken any of her blood pressure medicine recently.  Today she had a maroon-colored stool.  She did not have a bowel movement yesterday.  She has not been eating and drinking normally as she has not regained her appetite.  She is having some left lower quadrant abdominal pain.  Colonoscopy the day before discharge did not show any active areas of bleeding or diverticulitis.  She does have diverticulosis.  In addition to having blood in her stool again, she is having some dizziness.  Again, this consolation of symptoms just started today.  Her bowel movements are normal otherwise.  Denies any urinary complaints.  No chest pain or shortness of breath.  She is not having any fevers.  No recent injury.  She is not taking any supplements or iron.  She is not taking  Coumadin currently.   Past Medical History:  Diagnosis Date  . Allergic rhinitis   . Depression   . Emphysema of lung ([place])    3L home O2  . GERD (gastroesophageal reflux disease)   . Hypertension   . Hypothyroidism   . Obesity, morbid, BMI 50 or higher ([place])   . Stroke [facility]) 2016   TIA   . Urine incontinence    Past Surgical History:  Procedure Laterality Date  . ABDOMINAL HYSTERECTOMY    . CESAREAN SECTION    . COLONOSCOPY WITH PROPOFOL N/A 08/05/2019   Procedure: COLONOSCOPY WITH PROPOFOL;  Surgeon: Wilford Corner, MD;  Location: [facility] ENDOSCOPY;  Service: Gastroenterology;  Laterality: N/A;   Family History  Problem Relation Age of Onset  . Heart disease Father        MVP and Pics Valve  . Hypertension Father   . Depression Father        Institutionalized x's 2 years  . Bipolar disorder Father   . Hypertension Sister   . Diabetes Sister   . Hyperlipidemia Sister   . Heart disease Sister 59       MI  . Heart disease Brother   . Hypertension Brother   . Heart disease Paternal Grandmother   . Heart disease Paternal Aunt   . Heart disease Paternal Uncle   . Schizophrenia Paternal Aunt   . Asthma Son   . Asthma Son  Allergies as of 08/21/2019      Reactions   Hydrocodone Other (See Comments)   Constipation, hallucinations   Norvasc [amlodipine Besylate]    Marked swelling      Medication List       Accurate as of August 21, 2019  3:16 PM. If you have any questions, ask your nurse or doctor.        Accu-Chek FastClix Lancets Misc USE TO CHECK BLOOD SUGAR UP TO FOUR TIMES DAILY AS DIRECTED   Accu-Chek Guide test strip Generic drug: glucose blood USE TO CHECK BLOOD SUGAR UP TO FOUR TIMES DAILY   albuterol 108 (90 Base) MCG/ACT inhaler Commonly known as: VENTOLIN HFA Inhale 1-2 puffs into the lungs every 6 (six) hours as needed for wheezing or shortness of breath.   albuterol (2.5 MG/3ML) 0.083% nebulizer solution Commonly known as: PROVENTIL  Take 3 mLs (2.5 mg total) by nebulization every 6 (six) hours as needed for wheezing or shortness of breath.   blood glucose meter kit and supplies Kit Dispense based on patient and insurance preference. Use up to four times daily as directed. (FOR ICD-9 250.00, 250.01).   busPIRone 15 MG tablet Commonly known as: BUSPAR Take 15 mg by mouth daily.   famotidine 20 MG tablet Commonly known as: PEPCID Take 20 mg by mouth daily as needed for heartburn or indigestion.   fluticasone 50 MCG/ACT nasal spray Commonly known as: FLONASE Place 2 sprays into both nostrils daily as needed for allergies.   glipiZIDE 5 MG tablet Commonly known as: GLUCOTROL Take 1 tablet (5 mg total) by mouth 2 (two) times daily before a meal.   insulin glargine 100 UNIT/ML injection Commonly known as: LANTUS Inject 0.3 mLs (30 Units total) into the skin at bedtime.   Insulin Syringe-Needle U-100 28G X 5/16" 0.5 ML Misc Use with lantus once a day   levothyroxine 50 MCG tablet Commonly known as: SYNTHROID Take 1 tablet by mouth once daily   lisinopril 20 MG tablet Commonly known as: ZESTRIL Take 1 tablet by mouth once daily   LORazepam 1 MG tablet Commonly known as: ATIVAN Take 0.5-1.5 mg by mouth See admin instructions. Take 0.73m by mouth in the morning and 1.554mby mouth at bedtime as needed for anxiety.   metFORMIN 500 MG tablet Commonly known as: Glucophage Take 2 tablets (1,000 mg total) by mouth 2 (two) times daily with a meal. What changed: when to take this   metoprolol succinate 50 MG 24 hr tablet Commonly known as: TOPROL-XL TAKE 2 TABLETS BY MOUTH ONCE DAILY IN THE MORNING WITH A MEAL OR IMMEDIATELY FOLLOWING A MEAL What changed:   how much to take  how to take this  when to take this  additional instructions   NONFORMULARY OR COMPOUNDED ITEM Pt/ inr   Dx dvt   Tomorrow 04/05/2019  Please call office [phone] results   NONFORMULARY OR COMPOUNDED ITEM PT/ inr    Dx dvt    NONFORMULARY OR COMPOUNDED ITEM Compression stockings  20-30 mm/hg  #1   Dx low ext edema   OXYGEN Inhale 3 L into the lungs continuous.   potassium chloride SA 20 MEQ tablet Commonly known as: KLOR-CON Take 1 tablet by mouth once daily   QUEtiapine 100 MG tablet Commonly known as: SEROQUEL Take 100 mg by mouth at bedtime.   torsemide 20 MG tablet Commonly known as: DEMADEX Take 2 tablets by mouth once daily   zolpidem 10 MG tablet Commonly known  as: AMBIEN Take 10 mg by mouth at bedtime.       ROS:  Constitutional: No fevers or chills, no weight loss HEENT: No headaches, hearing loss, or runny nose, no sore throat Heart: No chest pain Lungs: No SOB, no cough Abd: No bowel changes, no pain, no N/V GU: No urinary complaints Neuro: No numbness, tingling or weakness Msk: No joint or muscle pain  Objective No conversational dyspnea Age appropriate judgment and insight Nml affect and mood  Hematochezia - Plan: Ambulatory referral to Gastroenterology, CBC  Dizziness - Plan: Ambulatory referral to Gastroenterology, CBC  Anemia, unspecified type - Plan: Ambulatory referral to Gastroenterology, CBC  Discharge summary and medication list have been reviewed/reconciled.  Labs pending at the time of discharge have been reviewed or are still pending at the time of this visit.  Follow-up labs and appointments have been ordered and/or coordinated appropriately.  TRANSITIONAL CARE MANAGEMENT CERTIFICATION:  I certify the following are true:   1. Communication with the patient/care giver was made within 2 business days of discharge.  2. Complexity of Medical decision making is high.  3. Face to face visit occurred within 7 days of discharge.   Come now for CBC stat, will order GI referral to [facility] urgently, she is to cont to call them. If Hb low, will have to refer to ER again. If stable, can monitor again Friday.  The patient voiced understanding and agreement to the plan.   [place], DO 08/21/19 3:16 PM

## 2019-08-22 ENCOUNTER — Inpatient Hospital Stay: Payer: Medicare HMO | Admitting: Family Medicine

## 2019-08-23 ENCOUNTER — Encounter: Payer: Self-pay | Admitting: *Deleted

## 2019-08-23 ENCOUNTER — Other Ambulatory Visit: Payer: Self-pay

## 2019-08-23 NOTE — Patient Outreach (Signed)
[facility]) Care Management  08/23/2019  [name] Jul 08, 1951 [id number]   Transition of Care Referral  Referral Date:08/12/2019 Referral [facility] Liaison Date of Admission:08/02/2019 Diagnosis:"acute blood loss" Date of Discharge:08/08/2019 [place] [employer] *PCP Office Does TOC*   Multiple attempts to establish contact with patient without success. No response from letter mailed to patient. Case is being closed at this time.     Plan: RN CM will close case at this time.   [name], RN,BSN,CCM [facility] Telephonic Care Management Coordinator Direct Phone: [phone] Toll Free: [phone] Fax: [phone]

## 2019-08-26 ENCOUNTER — Other Ambulatory Visit: Payer: Medicare HMO

## 2019-08-26 ENCOUNTER — Telehealth: Payer: Self-pay | Admitting: *Deleted

## 2019-08-26 DIAGNOSIS — K921 Melena: Secondary | ICD-10-CM

## 2019-08-26 NOTE — Telephone Encounter (Signed)
Pt did not show for her lab appt at 1:45 today. Call pt and r/s?

## 2019-08-26 NOTE — Telephone Encounter (Signed)
I would prefer she r/s, yes. Ty.

## 2019-08-27 NOTE — Addendum Note (Signed)
Addended by: Kelle Darting A on: 08/27/2019 08:52 AM   Modules accepted: Orders

## 2019-08-27 NOTE — Telephone Encounter (Signed)
Spoke to pt. The earliest she can get a ride will be Thursday. She reports she continues to feel ok. Appt scheduled for 08/29/19 at 10:30am.

## 2019-08-28 ENCOUNTER — Other Ambulatory Visit: Payer: Self-pay | Admitting: Family Medicine

## 2019-08-28 DIAGNOSIS — J440 Chronic obstructive pulmonary disease with acute lower respiratory infection: Secondary | ICD-10-CM | POA: Diagnosis not present

## 2019-08-29 ENCOUNTER — Other Ambulatory Visit: Payer: Medicare HMO

## 2019-08-30 DIAGNOSIS — D500 Iron deficiency anemia secondary to blood loss (chronic): Secondary | ICD-10-CM | POA: Diagnosis not present

## 2019-08-30 DIAGNOSIS — K5904 Chronic idiopathic constipation: Secondary | ICD-10-CM | POA: Diagnosis not present

## 2019-08-30 DIAGNOSIS — Z8719 Personal history of other diseases of the digestive system: Secondary | ICD-10-CM | POA: Diagnosis not present

## 2019-09-02 ENCOUNTER — Telehealth: Payer: Self-pay | Admitting: *Deleted

## 2019-09-02 ENCOUNTER — Other Ambulatory Visit: Payer: Self-pay | Admitting: Family Medicine

## 2019-09-02 NOTE — Telephone Encounter (Signed)
Copied from [place] [phone]. Topic: General - Other >> Sep 02, 2019  9:39 AM Antonieta Iba C wrote: Reason for CRM: pt called in to reschedule her lab apt for tomorrow if possible. Not able to scheduled. Please assist pt.

## 2019-09-03 ENCOUNTER — Other Ambulatory Visit (INDEPENDENT_AMBULATORY_CARE_PROVIDER_SITE_OTHER): Payer: Medicare HMO

## 2019-09-03 ENCOUNTER — Other Ambulatory Visit: Payer: Self-pay

## 2019-09-03 ENCOUNTER — Telehealth: Payer: Self-pay | Admitting: Emergency Medicine

## 2019-09-03 DIAGNOSIS — K921 Melena: Secondary | ICD-10-CM | POA: Diagnosis not present

## 2019-09-03 LAB — CBC
HCT: 23.3 % — CL (ref 36.0–46.0)
Hemoglobin: 7.5 g/dL — CL (ref 12.0–15.0)
MCHC: 32.2 g/dL (ref 30.0–36.0)
MCV: 85.4 fl (ref 78.0–100.0)
Platelets: 283 10*3/uL (ref 150.0–400.0)
RBC: 2.72 Mil/uL — ABNORMAL LOW (ref 3.87–5.11)
RDW: 15.8 % — ABNORMAL HIGH (ref 11.5–15.5)
WBC: 7.8 10*3/uL (ref 4.0–10.5)

## 2019-09-03 NOTE — Telephone Encounter (Signed)
"  CRITICAL VALUE STICKER  CRITICAL VALUE:HGB 7.5 :[id number]::"hydrocortisone","hydrochlorothiazide"23.3  RECEIVER (on-site recipient of call):Sue Cadmus P.  DATE & TIME NOTIFIED: 09-03-19 1550  MESSENGER (representative from lab):Sei  MD NOTIFIED: Wendling/Robin, DR. Was on virtual call  TIME OF NOTIFICATION:1600  RESPONSE:

## 2019-09-03 NOTE — Telephone Encounter (Signed)
Informed the patient of results She was seen by Dr. Michail Sermon [facility]) last Thursday 08/29/2019 Will fax over results to him. She is feeling better, but not great.  Still feeling very fatigue. BP has been a little on the low side average 110/60 (she is not taking any BP medication)

## 2019-09-03 NOTE — Telephone Encounter (Signed)
Hb stable. She was supposed to have relatively fast f/u w GI. Does she have an appt? Please see that she is feeling OK and taking iron. Ty.

## 2019-09-05 ENCOUNTER — Other Ambulatory Visit: Payer: Self-pay

## 2019-09-05 MED ORDER — METOPROLOL SUCCINATE ER 50 MG PO TB24: ORAL_TABLET | ORAL | 0 refills | Status: DC

## 2019-09-23 ENCOUNTER — Other Ambulatory Visit: Payer: Self-pay | Admitting: Family Medicine

## 2019-09-23 ENCOUNTER — Telehealth: Payer: Self-pay | Admitting: Family Medicine

## 2019-09-23 DIAGNOSIS — D500 Iron deficiency anemia secondary to blood loss (chronic): Secondary | ICD-10-CM

## 2019-09-23 DIAGNOSIS — Z8719 Personal history of other diseases of the digestive system: Secondary | ICD-10-CM

## 2019-09-23 NOTE — Telephone Encounter (Signed)
Copied from [place] [phone]. Topic: General - Other >> Sep 23, 2019  4:28 PM Rainey Pines A wrote: Patient stated that GI doctor has sent over via fax cbc lab draw order and havent heard back yet. Please advise

## 2019-09-23 NOTE — Telephone Encounter (Signed)
Please review for refill of metformin 500 mg tab. Prescription expired on 08/18/2019 LOV 08/21/2019 Historical provider.

## 2019-09-23 NOTE — Telephone Encounter (Signed)
Copied from [place] [phone]. Topic: Quick Communication - Rx Refill/Question >> Sep 23, 2019  4:28 PM Rainey Pines A wrote: Medication: metFORMIN (GLUCOPHAGE) 500 MG tablet (Patient stated that pharmacy ahas tried reaching out multiple times to get medication signed off on and sent back over.)  Has the patient contacted their pharmacy? Yes (Agent: If no, request that the patient contact the pharmacy for the refill.) (Agent: If yes, when and what did the pharmacy advise?)Contact PCP  Preferred Pharmacy (with phone number or street name): [place], [place]  Phone:  [phone] Fax:  [phone]     Agent: Please be advised that RX refills may take up to 3 business days. We ask that you follow-up with your pharmacy.

## 2019-09-23 NOTE — Telephone Encounter (Signed)
I have not seen a fax

## 2019-09-24 ENCOUNTER — Telehealth: Payer: Self-pay | Admitting: *Deleted

## 2019-09-24 MED ORDER — METFORMIN HCL 500 MG PO TABS: 1000.0000 mg | ORAL_TABLET | Freq: Two times a day (BID) | ORAL | 2 refills | Status: DC

## 2019-09-24 NOTE — Telephone Encounter (Signed)
Copied from [place] [phone]. Topic: Appointment Scheduling - Scheduling Inquiry for Clinic >> Sep 24, 2019 11:18 AM Erick Blinks wrote: Reason for CRM: Pt is wanting to schedule CBC labs, please advise w orders  Best contact: [phone]

## 2019-09-24 NOTE — Telephone Encounter (Signed)
Thank you :)

## 2019-09-24 NOTE — Telephone Encounter (Signed)
Pt called to check status of refill, please advise

## 2019-09-24 NOTE — Telephone Encounter (Signed)
Pt scheduled for lab appointment tomorrow

## 2019-09-24 NOTE — Telephone Encounter (Signed)
Pt called to check if we have received orders from GI. Advised her I would call GI office. Dr Michail Sermon, [facility] at [phone].  Received verbal orders for: CBC D, Ferritin and iron panel. 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  They would like results faxed back to [phone].  Future orders placed and lab appointment scheduled for tomorrow at 2:45pm.

## 2019-09-24 NOTE — Telephone Encounter (Signed)
Refill sent. Notified pt.

## 2019-09-25 ENCOUNTER — Other Ambulatory Visit (INDEPENDENT_AMBULATORY_CARE_PROVIDER_SITE_OTHER): Payer: Medicare HMO

## 2019-09-25 ENCOUNTER — Other Ambulatory Visit: Payer: Self-pay

## 2019-09-25 DIAGNOSIS — D500 Iron deficiency anemia secondary to blood loss (chronic): Secondary | ICD-10-CM

## 2019-09-25 DIAGNOSIS — Z8719 Personal history of other diseases of the digestive system: Secondary | ICD-10-CM

## 2019-09-25 LAB — IRON,TIBC AND FERRITIN PANEL
%SAT: 6 % (calc) — ABNORMAL LOW (ref 16–45)
Ferritin: 12 ng/mL — ABNORMAL LOW (ref 16–288)
Iron: 24 ug/dL — ABNORMAL LOW (ref 45–160)
TIBC: 417 mcg/dL (calc) (ref 250–450)

## 2019-09-26 ENCOUNTER — Telehealth: Payer: Self-pay | Admitting: *Deleted

## 2019-09-26 LAB — CBC WITH DIFFERENTIAL/PLATELET
Basophils Absolute: 0 10*3/uL (ref 0.0–0.1)
Basophils Relative: 0.5 % (ref 0.0–3.0)
Eosinophils Absolute: 0.3 10*3/uL (ref 0.0–0.7)
Eosinophils Relative: 3.2 % (ref 0.0–5.0)
HCT: 25.7 % — ABNORMAL LOW (ref 36.0–46.0)
Hemoglobin: 7.8 g/dL — CL (ref 12.0–15.0)
Lymphocytes Relative: 34 % (ref 12.0–46.0)
Lymphs Abs: 2.6 10*3/uL (ref 0.7–4.0)
MCHC: 30.4 g/dL (ref 30.0–36.0)
MCV: 82.5 fl (ref 78.0–100.0)
Monocytes Absolute: 0.5 10*3/uL (ref 0.1–1.0)
Monocytes Relative: 7 % (ref 3.0–12.0)
Neutro Abs: 4.3 10*3/uL (ref 1.4–7.7)
Neutrophils Relative %: 55.3 % (ref 43.0–77.0)
Platelets: 257 10*3/uL (ref 150.0–400.0)
RBC: 3.12 Mil/uL — ABNORMAL LOW (ref 3.87–5.11)
RDW: 18 % — ABNORMAL HIGH (ref 11.5–15.5)
WBC: 7.8 10*3/uL (ref 4.0–10.5)

## 2019-09-26 NOTE — Telephone Encounter (Signed)
Gi ordered Results faxed to GI

## 2019-09-26 NOTE — Telephone Encounter (Signed)
CRITICAL VALUE STICKER  CRITICAL VALUE:  HGB  7.8  RECEIVER (on-site recipient of call):  Kelle Darting, [place] NOTIFIED: 09/26/19 11:04am  MESSENGER (representative from lab): Shari Prows.  MD NOTIFIED: Carollee Herter / CMA, Heather  TIME OF NOTIFICATION:  RESPONSE:

## 2019-09-28 DIAGNOSIS — J440 Chronic obstructive pulmonary disease with acute lower respiratory infection: Secondary | ICD-10-CM | POA: Diagnosis not present

## 2019-10-07 ENCOUNTER — Other Ambulatory Visit: Payer: Self-pay | Admitting: Family Medicine

## 2019-10-07 DIAGNOSIS — E876 Hypokalemia: Secondary | ICD-10-CM

## 2019-10-08 DIAGNOSIS — D509 Iron deficiency anemia, unspecified: Secondary | ICD-10-CM | POA: Diagnosis not present

## 2019-10-08 DIAGNOSIS — K219 Gastro-esophageal reflux disease without esophagitis: Secondary | ICD-10-CM | POA: Diagnosis not present

## 2019-10-08 DIAGNOSIS — K5900 Constipation, unspecified: Secondary | ICD-10-CM | POA: Diagnosis not present

## 2019-10-15 ENCOUNTER — Telehealth: Payer: Self-pay

## 2019-10-15 ENCOUNTER — Other Ambulatory Visit: Payer: Self-pay | Admitting: Family Medicine

## 2019-10-15 DIAGNOSIS — D509 Iron deficiency anemia, unspecified: Secondary | ICD-10-CM

## 2019-10-15 NOTE — Telephone Encounter (Signed)
Left VM for patient to call back to schedule a lab appointment. We received a order request from [facility] to have labs drawn.

## 2019-10-29 ENCOUNTER — Ambulatory Visit (INDEPENDENT_AMBULATORY_CARE_PROVIDER_SITE_OTHER): Payer: Medicare HMO | Admitting: Family Medicine

## 2019-10-29 ENCOUNTER — Other Ambulatory Visit: Payer: Medicare HMO

## 2019-10-29 DIAGNOSIS — J440 Chronic obstructive pulmonary disease with acute lower respiratory infection: Secondary | ICD-10-CM | POA: Diagnosis not present

## 2019-11-04 ENCOUNTER — Other Ambulatory Visit: Payer: Self-pay | Admitting: Family Medicine

## 2019-11-07 ENCOUNTER — Other Ambulatory Visit: Payer: Self-pay | Admitting: Family Medicine

## 2019-11-07 ENCOUNTER — Other Ambulatory Visit: Payer: Self-pay

## 2019-11-07 ENCOUNTER — Telehealth: Payer: Self-pay | Admitting: Family Medicine

## 2019-11-07 DIAGNOSIS — D509 Iron deficiency anemia, unspecified: Secondary | ICD-10-CM

## 2019-11-07 DIAGNOSIS — R600 Localized edema: Secondary | ICD-10-CM

## 2019-11-07 NOTE — Telephone Encounter (Signed)
Labs changed. Pt notified

## 2019-11-07 NOTE — Telephone Encounter (Signed)
Patient is requesting to have lab draw a [place] location

## 2019-11-08 ENCOUNTER — Other Ambulatory Visit (INDEPENDENT_AMBULATORY_CARE_PROVIDER_SITE_OTHER): Payer: Medicare HMO

## 2019-11-08 DIAGNOSIS — D509 Iron deficiency anemia, unspecified: Secondary | ICD-10-CM | POA: Diagnosis not present

## 2019-11-08 LAB — CBC WITH DIFFERENTIAL/PLATELET
Basophils Absolute: 0 10*3/uL (ref 0.0–0.1)
Basophils Relative: 0.8 % (ref 0.0–3.0)
Eosinophils Absolute: 0.2 10*3/uL (ref 0.0–0.7)
Eosinophils Relative: 2.9 % (ref 0.0–5.0)
HCT: 31.1 % — ABNORMAL LOW (ref 36.0–46.0)
Hemoglobin: 10.1 g/dL — ABNORMAL LOW (ref 12.0–15.0)
Lymphocytes Relative: 38.2 % (ref 12.0–46.0)
Lymphs Abs: 2.4 10*3/uL (ref 0.7–4.0)
MCHC: 32.6 g/dL (ref 30.0–36.0)
MCV: 88.4 fl (ref 78.0–100.0)
Monocytes Absolute: 0.5 10*3/uL (ref 0.1–1.0)
Monocytes Relative: 8.3 % (ref 3.0–12.0)
Neutro Abs: 3.1 10*3/uL (ref 1.4–7.7)
Neutrophils Relative %: 49.8 % (ref 43.0–77.0)
Platelets: 198 10*3/uL (ref 150.0–400.0)
RBC: 3.51 Mil/uL — ABNORMAL LOW (ref 3.87–5.11)
RDW: 25.7 % — ABNORMAL HIGH (ref 11.5–15.5)
WBC: 6.3 10*3/uL (ref 4.0–10.5)

## 2019-11-08 LAB — IBC + FERRITIN
Ferritin: 42.3 ng/mL (ref 10.0–291.0)
Iron: 64 ug/dL (ref 42–145)
Saturation Ratios: 16.9 % — ABNORMAL LOW (ref 20.0–50.0)
Transferrin: 271 mg/dL (ref 212.0–360.0)

## 2019-11-11 ENCOUNTER — Other Ambulatory Visit: Payer: Self-pay | Admitting: Family Medicine

## 2019-11-11 ENCOUNTER — Telehealth: Payer: Self-pay | Admitting: Family Medicine

## 2019-11-11 DIAGNOSIS — K921 Melena: Secondary | ICD-10-CM

## 2019-11-11 NOTE — Telephone Encounter (Signed)
Her hgb is up to 10 but if she is bleeding she needs to call her GI --- or go to ER

## 2019-11-11 NOTE — Telephone Encounter (Signed)
Patient is requesting lab results. Patient also states that she is still  reactal bleeding. Please advise

## 2019-11-11 NOTE — Telephone Encounter (Signed)
Pt advised. Pt states she did call GI and is waiting to hear back from them

## 2019-11-11 NOTE — Telephone Encounter (Signed)
Please review labs. 

## 2019-11-12 ENCOUNTER — Ambulatory Visit (INDEPENDENT_AMBULATORY_CARE_PROVIDER_SITE_OTHER): Payer: Medicare HMO | Admitting: Family Medicine

## 2019-11-13 ENCOUNTER — Emergency Department (HOSPITAL_COMMUNITY): Payer: Medicare HMO

## 2019-11-13 ENCOUNTER — Inpatient Hospital Stay (HOSPITAL_COMMUNITY): Payer: Medicare HMO

## 2019-11-13 ENCOUNTER — Encounter (HOSPITAL_COMMUNITY): Payer: Self-pay

## 2019-11-13 ENCOUNTER — Other Ambulatory Visit: Payer: Self-pay

## 2019-11-13 ENCOUNTER — Inpatient Hospital Stay (HOSPITAL_COMMUNITY)
Admission: EM | Admit: 2019-11-13 | Discharge: 2019-12-02 | DRG: 377 | Disposition: A | Discharge status: Home Health | Payer: Medicare HMO | Attending: Family Medicine | Admitting: Family Medicine

## 2019-11-13 DIAGNOSIS — I130 Hypertensive heart and chronic kidney disease with heart failure and stage 1 through stage 4 chronic kidney disease, or unspecified chronic kidney disease: Secondary | ICD-10-CM | POA: Diagnosis not present

## 2019-11-13 DIAGNOSIS — Z833 Family history of diabetes mellitus: Secondary | ICD-10-CM

## 2019-11-13 DIAGNOSIS — G459 Transient cerebral ischemic attack, unspecified: Secondary | ICD-10-CM | POA: Diagnosis not present

## 2019-11-13 DIAGNOSIS — Z8673 Personal history of transient ischemic attack (TIA), and cerebral infarction without residual deficits: Secondary | ICD-10-CM

## 2019-11-13 DIAGNOSIS — I509 Heart failure, unspecified: Secondary | ICD-10-CM | POA: Diagnosis not present

## 2019-11-13 DIAGNOSIS — E039 Hypothyroidism, unspecified: Secondary | ICD-10-CM | POA: Diagnosis not present

## 2019-11-13 DIAGNOSIS — Z20822 Contact with and (suspected) exposure to covid-19: Secondary | ICD-10-CM | POA: Diagnosis not present

## 2019-11-13 DIAGNOSIS — J449 Chronic obstructive pulmonary disease, unspecified: Secondary | ICD-10-CM | POA: Diagnosis not present

## 2019-11-13 DIAGNOSIS — K5520 Angiodysplasia of colon without hemorrhage: Secondary | ICD-10-CM | POA: Diagnosis present

## 2019-11-13 DIAGNOSIS — E11649 Type 2 diabetes mellitus with hypoglycemia without coma: Secondary | ICD-10-CM | POA: Diagnosis not present

## 2019-11-13 DIAGNOSIS — R072 Precordial pain: Secondary | ICD-10-CM | POA: Diagnosis not present

## 2019-11-13 DIAGNOSIS — I10 Essential (primary) hypertension: Secondary | ICD-10-CM | POA: Diagnosis not present

## 2019-11-13 DIAGNOSIS — K633 Ulcer of intestine: Secondary | ICD-10-CM | POA: Diagnosis not present

## 2019-11-13 DIAGNOSIS — Z888 Allergy status to other drugs, medicaments and biological substances status: Secondary | ICD-10-CM | POA: Diagnosis not present

## 2019-11-13 DIAGNOSIS — E1129 Type 2 diabetes mellitus with other diabetic kidney complication: Secondary | ICD-10-CM | POA: Diagnosis present

## 2019-11-13 DIAGNOSIS — K625 Hemorrhage of anus and rectum: Secondary | ICD-10-CM | POA: Diagnosis not present

## 2019-11-13 DIAGNOSIS — E1165 Type 2 diabetes mellitus with hyperglycemia: Secondary | ICD-10-CM | POA: Diagnosis not present

## 2019-11-13 DIAGNOSIS — K922 Gastrointestinal hemorrhage, unspecified: Secondary | ICD-10-CM | POA: Diagnosis not present

## 2019-11-13 DIAGNOSIS — F329 Major depressive disorder, single episode, unspecified: Secondary | ICD-10-CM | POA: Diagnosis present

## 2019-11-13 DIAGNOSIS — D62 Acute posthemorrhagic anemia: Secondary | ICD-10-CM | POA: Diagnosis not present

## 2019-11-13 DIAGNOSIS — K5731 Diverticulosis of large intestine without perforation or abscess with bleeding: Principal | ICD-10-CM | POA: Diagnosis present

## 2019-11-13 DIAGNOSIS — D649 Anemia, unspecified: Secondary | ICD-10-CM

## 2019-11-13 DIAGNOSIS — Z9981 Dependence on supplemental oxygen: Secondary | ICD-10-CM

## 2019-11-13 DIAGNOSIS — J9611 Chronic respiratory failure with hypoxia: Secondary | ICD-10-CM | POA: Diagnosis not present

## 2019-11-13 DIAGNOSIS — J9610 Chronic respiratory failure, unspecified whether with hypoxia or hypercapnia: Secondary | ICD-10-CM | POA: Diagnosis present

## 2019-11-13 DIAGNOSIS — E1122 Type 2 diabetes mellitus with diabetic chronic kidney disease: Secondary | ICD-10-CM | POA: Diagnosis not present

## 2019-11-13 DIAGNOSIS — K219 Gastro-esophageal reflux disease without esophagitis: Secondary | ICD-10-CM | POA: Diagnosis present

## 2019-11-13 DIAGNOSIS — Z794 Long term (current) use of insulin: Secondary | ICD-10-CM | POA: Diagnosis not present

## 2019-11-13 DIAGNOSIS — N183 Chronic kidney disease, stage 3 unspecified: Secondary | ICD-10-CM

## 2019-11-13 DIAGNOSIS — J439 Emphysema, unspecified: Secondary | ICD-10-CM | POA: Diagnosis not present

## 2019-11-13 DIAGNOSIS — D500 Iron deficiency anemia secondary to blood loss (chronic): Secondary | ICD-10-CM | POA: Diagnosis not present

## 2019-11-13 DIAGNOSIS — N1832 Chronic kidney disease, stage 3b: Secondary | ICD-10-CM | POA: Diagnosis present

## 2019-11-13 DIAGNOSIS — Z825 Family history of asthma and other chronic lower respiratory diseases: Secondary | ICD-10-CM

## 2019-11-13 DIAGNOSIS — Z885 Allergy status to narcotic agent status: Secondary | ICD-10-CM

## 2019-11-13 DIAGNOSIS — Z8349 Family history of other endocrine, nutritional and metabolic diseases: Secondary | ICD-10-CM

## 2019-11-13 DIAGNOSIS — Z6841 Body Mass Index (BMI) 40.0 and over, adult: Secondary | ICD-10-CM | POA: Diagnosis not present

## 2019-11-13 DIAGNOSIS — J440 Chronic obstructive pulmonary disease with acute lower respiratory infection: Secondary | ICD-10-CM | POA: Diagnosis not present

## 2019-11-13 DIAGNOSIS — R079 Chest pain, unspecified: Secondary | ICD-10-CM | POA: Diagnosis not present

## 2019-11-13 DIAGNOSIS — R0602 Shortness of breath: Secondary | ICD-10-CM | POA: Diagnosis not present

## 2019-11-13 DIAGNOSIS — K5521 Angiodysplasia of colon with hemorrhage: Secondary | ICD-10-CM | POA: Diagnosis not present

## 2019-11-13 DIAGNOSIS — E46 Unspecified protein-calorie malnutrition: Secondary | ICD-10-CM | POA: Diagnosis present

## 2019-11-13 DIAGNOSIS — Z7901 Long term (current) use of anticoagulants: Secondary | ICD-10-CM | POA: Diagnosis not present

## 2019-11-13 DIAGNOSIS — J9621 Acute and chronic respiratory failure with hypoxia: Secondary | ICD-10-CM | POA: Diagnosis present

## 2019-11-13 DIAGNOSIS — F419 Anxiety disorder, unspecified: Secondary | ICD-10-CM | POA: Diagnosis present

## 2019-11-13 DIAGNOSIS — I5032 Chronic diastolic (congestive) heart failure: Secondary | ICD-10-CM | POA: Diagnosis present

## 2019-11-13 DIAGNOSIS — Z79899 Other long term (current) drug therapy: Secondary | ICD-10-CM

## 2019-11-13 DIAGNOSIS — D696 Thrombocytopenia, unspecified: Secondary | ICD-10-CM | POA: Diagnosis present

## 2019-11-13 DIAGNOSIS — K5752 Diverticulitis of both small and large intestine without perforation or abscess without bleeding: Secondary | ICD-10-CM | POA: Diagnosis not present

## 2019-11-13 DIAGNOSIS — Z86718 Personal history of other venous thrombosis and embolism: Secondary | ICD-10-CM | POA: Diagnosis not present

## 2019-11-13 DIAGNOSIS — R58 Hemorrhage, not elsewhere classified: Secondary | ICD-10-CM | POA: Diagnosis not present

## 2019-11-13 DIAGNOSIS — Z79890 Hormone replacement therapy (postmenopausal): Secondary | ICD-10-CM

## 2019-11-13 DIAGNOSIS — I129 Hypertensive chronic kidney disease with stage 1 through stage 4 chronic kidney disease, or unspecified chronic kidney disease: Secondary | ICD-10-CM | POA: Diagnosis not present

## 2019-11-13 DIAGNOSIS — K5730 Diverticulosis of large intestine without perforation or abscess without bleeding: Secondary | ICD-10-CM | POA: Diagnosis not present

## 2019-11-13 DIAGNOSIS — E785 Hyperlipidemia, unspecified: Secondary | ICD-10-CM | POA: Diagnosis present

## 2019-11-13 DIAGNOSIS — R52 Pain, unspecified: Secondary | ICD-10-CM | POA: Diagnosis not present

## 2019-11-13 DIAGNOSIS — N179 Acute kidney failure, unspecified: Secondary | ICD-10-CM | POA: Diagnosis not present

## 2019-11-13 DIAGNOSIS — K2900 Acute gastritis without bleeding: Secondary | ICD-10-CM | POA: Diagnosis not present

## 2019-11-13 DIAGNOSIS — R1084 Generalized abdominal pain: Secondary | ICD-10-CM | POA: Diagnosis not present

## 2019-11-13 DIAGNOSIS — K921 Melena: Secondary | ICD-10-CM | POA: Diagnosis not present

## 2019-11-13 DIAGNOSIS — K31819 Angiodysplasia of stomach and duodenum without bleeding: Secondary | ICD-10-CM | POA: Diagnosis not present

## 2019-11-13 DIAGNOSIS — Z90710 Acquired absence of both cervix and uterus: Secondary | ICD-10-CM

## 2019-11-13 DIAGNOSIS — IMO0002 Reserved for concepts with insufficient information to code with codable children: Secondary | ICD-10-CM | POA: Diagnosis present

## 2019-11-13 DIAGNOSIS — I959 Hypotension, unspecified: Secondary | ICD-10-CM | POA: Diagnosis present

## 2019-11-13 DIAGNOSIS — R0600 Dyspnea, unspecified: Secondary | ICD-10-CM

## 2019-11-13 DIAGNOSIS — Z818 Family history of other mental and behavioral disorders: Secondary | ICD-10-CM

## 2019-11-13 DIAGNOSIS — K8020 Calculus of gallbladder without cholecystitis without obstruction: Secondary | ICD-10-CM | POA: Diagnosis not present

## 2019-11-13 DIAGNOSIS — K640 First degree hemorrhoids: Secondary | ICD-10-CM | POA: Diagnosis present

## 2019-11-13 DIAGNOSIS — R0789 Other chest pain: Secondary | ICD-10-CM | POA: Diagnosis not present

## 2019-11-13 DIAGNOSIS — Z87891 Personal history of nicotine dependence: Secondary | ICD-10-CM

## 2019-11-13 DIAGNOSIS — Z8619 Personal history of other infectious and parasitic diseases: Secondary | ICD-10-CM

## 2019-11-13 DIAGNOSIS — E876 Hypokalemia: Secondary | ICD-10-CM | POA: Diagnosis not present

## 2019-11-13 DIAGNOSIS — Z8249 Family history of ischemic heart disease and other diseases of the circulatory system: Secondary | ICD-10-CM

## 2019-11-13 LAB — GLUCOSE, CAPILLARY
Glucose-Capillary: 310 mg/dL — ABNORMAL HIGH (ref 70–99)
Glucose-Capillary: 290 mg/dL — ABNORMAL HIGH (ref 70–99)
Glucose-Capillary: 264 mg/dL — ABNORMAL HIGH (ref 70–99)
Glucose-Capillary: 169 mg/dL — ABNORMAL HIGH (ref 70–99)

## 2019-11-13 LAB — COMPREHENSIVE METABOLIC PANEL
ALT: 12 U/L (ref 0–44)
AST: 18 U/L (ref 15–41)
Albumin: 2.7 g/dL — ABNORMAL LOW (ref 3.5–5.0)
Alkaline Phosphatase: 116 U/L (ref 38–126)
Anion gap: 11 (ref 5–15)
BUN: 29 mg/dL — ABNORMAL HIGH (ref 8–23)
CO2: 25 mmol/L (ref 22–32)
Calcium: 8.6 mg/dL — ABNORMAL LOW (ref 8.9–10.3)
Chloride: 102 mmol/L (ref 98–111)
Creatinine, Ser: 1.61 mg/dL — ABNORMAL HIGH (ref 0.44–1.00)
GFR calc Af Amer: 38 mL/min — ABNORMAL LOW (ref >60)
GFR calc non Af Amer: 33 mL/min — ABNORMAL LOW (ref >60)
Glucose, Bld: 343 mg/dL — ABNORMAL HIGH (ref 70–99)
Potassium: 3.1 mmol/L — ABNORMAL LOW (ref 3.5–5.1)
Sodium: 138 mmol/L (ref 135–145)
Total Bilirubin: 0.4 mg/dL (ref 0.3–1.2)
Total Protein: 6.4 g/dL — ABNORMAL LOW (ref 6.5–8.1)

## 2019-11-13 LAB — CBC WITH DIFFERENTIAL/PLATELET
Abs Immature Granulocytes: 0.07 10*3/uL (ref 0.00–0.07)
Basophils Absolute: 0 10*3/uL (ref 0.0–0.1)
Basophils Relative: 0 %
Eosinophils Absolute: 0.1 10*3/uL (ref 0.0–0.5)
Eosinophils Relative: 1 %
HCT: 18.3 % — ABNORMAL LOW (ref 36.0–46.0)
Hemoglobin: 5.5 g/dL — CL (ref 12.0–15.0)
Immature Granulocytes: 1 %
Lymphocytes Relative: 31 %
Lymphs Abs: 2.9 10*3/uL (ref 0.7–4.0)
MCH: 29.1 pg (ref 26.0–34.0)
MCHC: 30.1 g/dL (ref 30.0–36.0)
MCV: 96.8 fL (ref 80.0–100.0)
Monocytes Absolute: 0.6 10*3/uL (ref 0.1–1.0)
Monocytes Relative: 7 %
Neutro Abs: 5.6 10*3/uL (ref 1.7–7.7)
Neutrophils Relative %: 60 %
Platelets: 176 10*3/uL (ref 150–400)
RBC: 1.89 MIL/uL — ABNORMAL LOW (ref 3.87–5.11)
RDW: 24.3 % — ABNORMAL HIGH (ref 11.5–15.5)
WBC: 9.3 10*3/uL (ref 4.0–10.5)
nRBC: 1 % — ABNORMAL HIGH (ref 0.0–0.2)

## 2019-11-13 LAB — BRAIN NATRIURETIC PEPTIDE: B Natriuretic Peptide: 30.4 pg/mL (ref 0.0–100.0)

## 2019-11-13 LAB — HEMOGLOBIN AND HEMATOCRIT, BLOOD
HCT: 29.3 % — ABNORMAL LOW (ref 36.0–46.0)
Hemoglobin: 9.2 g/dL — ABNORMAL LOW (ref 12.0–15.0)

## 2019-11-13 LAB — SARS CORONAVIRUS 2 (TAT 6-24 HRS): SARS Coronavirus 2: NEGATIVE

## 2019-11-13 LAB — HIV ANTIBODY (ROUTINE TESTING W REFLEX): HIV Screen 4th Generation wRfx: NONREACTIVE

## 2019-11-13 LAB — PREPARE RBC (CROSSMATCH)

## 2019-11-13 LAB — MRSA PCR SCREENING: MRSA by PCR: NEGATIVE

## 2019-11-13 LAB — TROPONIN I (HIGH SENSITIVITY)
Troponin I (High Sensitivity): 9 ng/L (ref <18)
Troponin I (High Sensitivity): 14 ng/L (ref <18)

## 2019-11-13 LAB — POC OCCULT BLOOD, ED: Fecal Occult Bld: POSITIVE — AB

## 2019-11-13 MED ORDER — ACETAMINOPHEN 650 MG RE SUPP: 650.0000 mg | Freq: Four times a day (QID) | RECTAL | Status: DC | PRN

## 2019-11-13 MED ORDER — FLUTICASONE PROPIONATE 50 MCG/ACT NA SUSP
2.0000 | Freq: Every day | NASAL | Status: DC | PRN
  Filled 2019-11-13: qty 16

## 2019-11-13 MED ORDER — SODIUM CHLORIDE 0.9 % IV BOLUS
500.0000 mL | Freq: Once | INTRAVENOUS | Status: AC
  Administered 2019-11-13: 500 mL via INTRAVENOUS

## 2019-11-13 MED ORDER — ACETAMINOPHEN 325 MG PO TABS: 650.0000 mg | ORAL_TABLET | Freq: Four times a day (QID) | ORAL | Status: DC | PRN

## 2019-11-13 MED ORDER — LORAZEPAM 1 MG PO TABS
1.5000 mg | ORAL_TABLET | Freq: Every evening | ORAL | Status: DC | PRN
  Administered 2019-11-14 – 2019-11-21 (×8): 1.5 mg via ORAL
  Administered 2019-11-22: 1 mg via ORAL
  Administered 2019-11-23 – 2019-12-01 (×10): 1.5 mg via ORAL
  Filled 2019-11-13 (×18): qty 1

## 2019-11-13 MED ORDER — POTASSIUM CHLORIDE CRYS ER 20 MEQ PO TBCR
40.0000 meq | EXTENDED_RELEASE_TABLET | Freq: Once | ORAL | Status: AC
  Administered 2019-11-13: 40 meq via ORAL
  Filled 2019-11-13: qty 2

## 2019-11-13 MED ORDER — CHLORHEXIDINE GLUCONATE CLOTH 2 % EX PADS
6.0000 | MEDICATED_PAD | Freq: Every day | CUTANEOUS | Status: DC
  Administered 2019-11-13 – 2019-11-15 (×3): 6 via TOPICAL

## 2019-11-13 MED ORDER — LEVOTHYROXINE SODIUM 50 MCG PO TABS
50.0000 ug | ORAL_TABLET | Freq: Every day | ORAL | Status: DC
  Administered 2019-11-13 – 2019-12-02 (×19): 50 ug via ORAL
  Filled 2019-11-13 (×19): qty 1

## 2019-11-13 MED ORDER — BUSPIRONE HCL 5 MG PO TABS
15.0000 mg | ORAL_TABLET | Freq: Every day | ORAL | Status: DC
  Administered 2019-11-13 – 2019-12-02 (×19): 15 mg via ORAL
  Filled 2019-11-13: qty 2
  Filled 2019-11-13 (×4): qty 3
  Filled 2019-11-13: qty 2
  Filled 2019-11-13: qty 3
  Filled 2019-11-13: qty 1
  Filled 2019-11-13 (×7): qty 3
  Filled 2019-11-13 (×2): qty 1
  Filled 2019-11-13 (×2): qty 3

## 2019-11-13 MED ORDER — IOHEXOL 350 MG/ML SOLN
100.0000 mL | Freq: Once | INTRAVENOUS | Status: AC | PRN
  Administered 2019-11-13: 80 mL via INTRAVENOUS

## 2019-11-13 MED ORDER — ZOLPIDEM TARTRATE 5 MG PO TABS
5.0000 mg | ORAL_TABLET | Freq: Every day | ORAL | Status: DC
  Administered 2019-11-14 – 2019-12-01 (×19): 5 mg via ORAL
  Filled 2019-11-13 (×19): qty 1

## 2019-11-13 MED ORDER — LORAZEPAM 0.5 MG PO TABS
0.5000 mg | ORAL_TABLET | Freq: Every day | ORAL | Status: DC | PRN
  Administered 2019-11-18 – 2019-11-22 (×2): 0.5 mg via ORAL
  Filled 2019-11-13 (×6): qty 1

## 2019-11-13 MED ORDER — ONDANSETRON HCL 4 MG PO TABS: 4.0000 mg | ORAL_TABLET | Freq: Four times a day (QID) | ORAL | Status: DC | PRN

## 2019-11-13 MED ORDER — INSULIN ASPART 100 UNIT/ML ~~LOC~~ SOLN
0.0000 [IU] | Freq: Every day | SUBCUTANEOUS | Status: DC
  Administered 2019-11-14: 2 [IU] via SUBCUTANEOUS
  Filled 2019-11-13: qty 0.05

## 2019-11-13 MED ORDER — LISINOPRIL 20 MG PO TABS
20.0000 mg | ORAL_TABLET | Freq: Every day | ORAL | Status: DC
  Administered 2019-11-13: 20 mg via ORAL
  Filled 2019-11-13: qty 1
  Filled 2019-11-13: qty 2
  Filled 2019-11-13: qty 1

## 2019-11-13 MED ORDER — INSULIN GLARGINE 100 UNIT/ML ~~LOC~~ SOLN
20.0000 [IU] | Freq: Every day | SUBCUTANEOUS | Status: DC
  Administered 2019-11-13 – 2019-11-30 (×17): 20 [IU] via SUBCUTANEOUS
  Filled 2019-11-13 (×21): qty 0.2

## 2019-11-13 MED ORDER — ORAL CARE MOUTH RINSE
15.0000 mL | Freq: Two times a day (BID) | OROMUCOSAL | Status: DC
  Administered 2019-11-13 – 2019-12-02 (×30): 15 mL via OROMUCOSAL

## 2019-11-13 MED ORDER — CYCLOBENZAPRINE HCL 5 MG PO TABS
5.0000 mg | ORAL_TABLET | Freq: Three times a day (TID) | ORAL | Status: DC | PRN
  Administered 2019-11-13 – 2019-11-21 (×4): 5 mg via ORAL
  Filled 2019-11-13 (×4): qty 1

## 2019-11-13 MED ORDER — ONDANSETRON HCL 4 MG/2ML IJ SOLN: 4.0000 mg | Freq: Four times a day (QID) | INTRAMUSCULAR | Status: DC | PRN

## 2019-11-13 MED ORDER — INFLUENZA VAC A&B SA ADJ QUAD 0.5 ML IM PRSY
0.5000 mL | PREFILLED_SYRINGE | INTRAMUSCULAR | Status: DC | PRN
  Filled 2019-11-13: qty 0.5

## 2019-11-13 MED ORDER — INSULIN ASPART 100 UNIT/ML ~~LOC~~ SOLN
0.0000 [IU] | Freq: Three times a day (TID) | SUBCUTANEOUS | Status: DC
  Administered 2019-11-13: 8 [IU] via SUBCUTANEOUS
  Administered 2019-11-13: 11 [IU] via SUBCUTANEOUS
  Administered 2019-11-13: 8 [IU] via SUBCUTANEOUS
  Administered 2019-11-14: 11 [IU] via SUBCUTANEOUS
  Administered 2019-11-14: 5 [IU] via SUBCUTANEOUS
  Administered 2019-11-14: 2 [IU] via SUBCUTANEOUS
  Administered 2019-11-15 (×3): 5 [IU] via SUBCUTANEOUS
  Administered 2019-11-16 (×2): 3 [IU] via SUBCUTANEOUS
  Administered 2019-11-16: 5 [IU] via SUBCUTANEOUS
  Administered 2019-11-17 (×3): 3 [IU] via SUBCUTANEOUS
  Administered 2019-11-18: 2 [IU] via SUBCUTANEOUS
  Administered 2019-11-18: 5 [IU] via SUBCUTANEOUS
  Administered 2019-11-18: 3 [IU] via SUBCUTANEOUS
  Administered 2019-11-19 (×2): 2 [IU] via SUBCUTANEOUS
  Administered 2019-11-20: 3 [IU] via SUBCUTANEOUS
  Administered 2019-11-20 – 2019-11-27 (×9): 2 [IU] via SUBCUTANEOUS
  Administered 2019-11-27: 3 [IU] via SUBCUTANEOUS
  Filled 2019-11-13: qty 0.15

## 2019-11-13 MED ORDER — METOPROLOL SUCCINATE ER 50 MG PO TB24
50.0000 mg | ORAL_TABLET | Freq: Every day | ORAL | Status: DC
  Administered 2019-11-13: 50 mg via ORAL
  Filled 2019-11-13 (×2): qty 1

## 2019-11-13 MED ORDER — MORPHINE SULFATE (PF) 2 MG/ML IV SOLN
2.0000 mg | INTRAVENOUS | Status: DC | PRN
  Administered 2019-11-13 – 2019-11-26 (×4): 2 mg via INTRAVENOUS
  Filled 2019-11-13 (×4): qty 1

## 2019-11-13 MED ORDER — SODIUM CHLORIDE (PF) 0.9 % IJ SOLN
INTRAMUSCULAR | Status: AC
  Filled 2019-11-13: qty 50

## 2019-11-13 MED ORDER — SODIUM CHLORIDE 0.9% IV SOLUTION: Freq: Once | INTRAVENOUS | Status: DC

## 2019-11-13 MED ORDER — LISINOPRIL 20 MG PO TABS: 20.0000 mg | ORAL_TABLET | Freq: Every day | ORAL | Status: DC

## 2019-11-13 MED ORDER — QUETIAPINE FUMARATE 50 MG PO TABS
100.0000 mg | ORAL_TABLET | Freq: Every day | ORAL | Status: DC
  Administered 2019-11-14 – 2019-12-01 (×19): 100 mg via ORAL
  Filled 2019-11-13 (×2): qty 1
  Filled 2019-11-13: qty 2
  Filled 2019-11-13 (×2): qty 1
  Filled 2019-11-13: qty 2
  Filled 2019-11-13 (×13): qty 1

## 2019-11-13 MED ORDER — FAMOTIDINE 20 MG PO TABS: 20.0000 mg | ORAL_TABLET | Freq: Every day | ORAL | Status: DC | PRN

## 2019-11-13 NOTE — Progress Notes (Addendum)
3rd unit of blood done at 18:00PM. Lab unable to draw H&H due to the protocol time being 2hrs from being transfused. Timed H&H placed for 20:00  Scheduled 12:00 H&H not done due to 2nd unit of blood running at that time. Next scheduled H&H to be drawn at 18:00PM

## 2019-11-13 NOTE — ED Notes (Signed)
Critical Hemoglobin result call taken from lab. MD and PA aware

## 2019-11-13 NOTE — ED Provider Notes (Signed)
[facility] DEPT Provider Note   CSN: [id number] Arrival date & time: 11/13/19  0315     History Chief Complaint  Patient presents with  . Rectal Bleeding  . Shortness of Breath    Sue [name] is a 69 y.o. female presenting for evaluation of sob and rectal bleeding.   Pt states for the past 3 days she has been having gradually worsening rectal bleeding.  Initially there was minimal blood when she had a bowel movement and wiped, but she states bleeding has worsened.  She is now having some rectal bleeding anytime she coughs or increases her abdominal pressure.  Today patient states she started to feel dizzy and lightheaded.  This is worse when she goes from sitting to standing.  She states she feels like she is going to pass out when she does that.  She is a history of rectal bleeding causing severe anemia.  She is not on blood thinners currently.  She was diagnosed with a DVT after leg trauma in June 2020.  This was stopped due to GI bleeding. She reports intermittent bilateral lower abd pain, but none currently.  Additionally, patient states she is feeling short of breath.  She is mostly short of breath with exertion.  She has not felt shortness of breath with her previous rectal bleeding episodes.  She denies chest pain, fever, cough.  She states this does not feel like a COPD exacerbation and does not feel like her CHF.  She denies significant leg swelling. She wears 3L O2 at baseline, has not needed to increase recently.   Additional history obtained from EMS/triage note.  Per EMS, patient went from sitting to standing because she became very short of breath and sats dropped to 80% while on her baseline 3 L.  Additional history obtained from chart review.  Patient with a history of GI bleed requiring hospitalization (x2 in the past 6 months).  Additional history of COPD for which she wears oxygen.  Recently diagnosed with a DVT in June 2020 after leg trauma,  was on Coumadin for a period of time but no longer.  Additional history of hypertension, depression, previous CVA, GERD, morbid obesity, CHF, diabetes.   Pt sees Dr. Michail Sermon with [facility].   HPI     Past Medical History:  Diagnosis Date  . Allergic rhinitis   . Depression   . Emphysema of lung ([place])    3L home O2  . GERD (gastroesophageal reflux disease)   . Hypertension   . Hypothyroidism   . Obesity, morbid, BMI 50 or higher ([place])   . Stroke [facility]) 2016   TIA   . Urine incontinence     Patient Active Problem List   Diagnosis Date Noted  . CKD (chronic kidney disease) stage 3, GFR 30-59 ml/min 11/13/2019  . Acute blood loss anemia 11/13/2019  . Lower GI bleed 08/16/2019  . Symptomatic anemia 08/03/2019  . QT prolongation 08/03/2019  . GI bleed 08/02/2019  . Diabetes mellitus type II, uncontrolled ([place]) 05/29/2019  . Hyperlipidemia associated with type 2 diabetes mellitus ([place]) 05/29/2019  . DVT (deep venous thrombosis) ([place]) 03/17/2019  . Acute on chronic renal insufficiency 03/17/2019  . Macrocytic anemia 03/17/2019  . Small bowel obstruction ([place]) 03/08/2019  . Fracture of left tibia 02/25/2019  . Left tibial fracture 02/25/2019  . Hyperglycemia 08/16/2018  . Morbid obesity due to excess calories ([place]) 05/09/2018  . Acute bronchitis with COPD ([place]) 10/19/2017  . Atypical chest  pain 05/01/2017  . Pulmonary emphysema ([place]) 04/06/2017  . Chronic seasonal allergic rhinitis 04/06/2017  . GERD (gastroesophageal reflux disease) 04/06/2017  . Snoring 04/06/2017  . Myalgia 02/01/2017  . Arthralgia 02/01/2017  . Chronic pain of toe of left foot 11/14/2016  . Chronic hepatitis C without hepatic coma ([place]) 08/01/2016  . Hypothyroidism 10/29/2015  . Mild diastolic dysfunction 24/58/0998  . Edema 01/01/2015  . Edema of both ankles 12/24/2014  . TIA (transient ischemic attack) 07/30/2014  . Weakness 07/29/2014  . Flank pain 11/12/2013  . Chronic respiratory failure ([place])  10/25/2013  . DOE (dyspnea on exertion) 10/25/2013  . Depression with anxiety 10/15/2013  . Insomnia 10/15/2013  . HTN (hypertension) 10/09/2013  . Hypokalemia 10/09/2013    Past Surgical History:  Procedure Laterality Date  . ABDOMINAL HYSTERECTOMY    . CESAREAN SECTION    . COLONOSCOPY N/A 08/19/2019   Procedure: COLONOSCOPY;  Surgeon: Clarene Essex, MD;  Location: [facility] ENDOSCOPY;  Service: Endoscopy;  Laterality: N/A;  . COLONOSCOPY WITH PROPOFOL N/A 08/05/2019   Procedure: COLONOSCOPY WITH PROPOFOL;  Surgeon: Wilford Corner, MD;  Location: [facility] ENDOSCOPY;  Service: Gastroenterology;  Laterality: N/A;     OB History   No obstetric history on file.     Family History  Problem Relation Age of Onset  . Heart disease Father        MVP and Pics Valve  . Hypertension Father   . Depression Father        Institutionalized x's 2 years  . Bipolar disorder Father   . Hypertension Sister   . Diabetes Sister   . Hyperlipidemia Sister   . Heart disease Sister 32       MI  . Heart disease Brother   . Hypertension Brother   . Heart disease Paternal Grandmother   . Heart disease Paternal Aunt   . Heart disease Paternal Uncle   . Schizophrenia Paternal Aunt   . Asthma Son   . Asthma Son     Social History   Tobacco Use  . Smoking status: Former Smoker    Packs/day: 1.00    Years: 40.00    Pack years: 40.00    Types: Cigarettes    Start date: 07/21/1972    Quit date: 10/16/2011    Years since quitting: 8.0  . Smokeless tobacco: Never Used  Substance Use Topics  . Alcohol use: Not Currently    Comment: Occ-- Wine  . Drug use: No    Home Medications Prior to Admission medications   Medication Sig Start Date End Date Taking? Authorizing Provider  albuterol (PROVENTIL HFA;VENTOLIN HFA) 108 (90 Base) MCG/ACT inhaler Inhale 1-2 puffs into the lungs every 6 (six) hours as needed for wheezing or shortness of breath.   Yes [provider]  albuterol (PROVENTIL) (2.5  MG/3ML) 0.083% nebulizer solution Take 3 mLs (2.5 mg total) by nebulization every 6 (six) hours as needed for wheezing or shortness of breath. 03/31/17  Yes Carollee Herter, Yvonne R, DO  busPIRone (BUSPAR) 15 MG tablet Take 15 mg by mouth daily.  12/27/17  Yes [provider]  famotidine (PEPCID) 20 MG tablet Take 20 mg by mouth daily as needed for heartburn or indigestion.   Yes [provider]  fluticasone (FLONASE) 50 MCG/ACT nasal spray Place 2 sprays into both nostrils daily as needed for allergies. 05/02/17  Yes Johnson, Clanford L, MD  glipiZIDE (GLUCOTROL) 5 MG tablet Take 1 tablet (5 mg total) by mouth 2 (two) times  daily before a meal. 05/29/19  Yes Lowne Chase, Yvonne R, DO  LANTUS 100 UNIT/ML injection INJECT 30 UNITS SUBCUTANEOUSLY AT BEDTIME Patient taking differently: Inject 30 Units into the skin daily.  11/04/19  Yes Ann Held, DO  levothyroxine (SYNTHROID) 50 MCG tablet Take 1 tablet by mouth once daily 07/01/19  Yes Lowne Lyndal Pulley R, DO  lisinopril (ZESTRIL) 20 MG tablet Take 1 tablet by mouth once daily Patient taking differently: Take 20 mg by mouth daily.  05/03/19  Yes Roma Schanz R, DO  LORazepam (ATIVAN) 1 MG tablet Take 0.5-1.5 mg by mouth See admin instructions. Take 0.60m by mouth in the morning and 1.518mby mouth at bedtime as needed for anxiety.   Yes [provider]  metFORMIN (GLUCOPHAGE) 500 MG tablet Take 2 tablets (1,000 mg total) by mouth 2 (two) times daily with a meal. 09/24/19 09/23/20 Yes Lowne ChLyndal Pulley, DO  metoprolol succinate (TOPROL-XL) 50 MG 24 hr tablet Take with or immediately following a meal. Patient taking differently: Take 50 mg by mouth daily. Take with or immediately following a meal. 09/05/19  Yes LoRoma Schanz, DO  potassium chloride SA (KLOR-CON) 20 MEQ tablet Take 1 tablet by mouth once daily 10/07/19  Yes Lowne Chase, Yvonne R, DO  QUEtiapine (SEROQUEL) 100 MG tablet Take 100 mg by mouth at  bedtime. 02/23/19  Yes [provider]  torsemide (DEMADEX) 20 MG tablet Take 2 tablets by mouth once daily 11/07/19  Yes Lowne Chase, Yvonne R, DO  zolpidem (AMBIEN) 10 MG tablet Take 10 mg by mouth at bedtime. 06/20/19  Yes [provider]  Accu-Chek FastClix Lancets MISC USE TO CHECK BLOOD SUGAR UP TO FOUR TIMES DAILY AS DIRECTED 07/15/19   LoCarollee HerterYvAlferd ApaDO  ACCU-CHEK GUIDE test strip USE TO CHECK BLOOD SUGAR UP TO FOUR TIMES DAILY 05/28/19   LoRoma Schanz, DO  blood glucose meter kit and supplies KIT Dispense based on patient and insurance preference. Use up to four times daily as directed. (FOR ICD-9 250.00, 250.01). 10/04/18   LoCarollee HerterYvAlferd ApaDO  Insulin Syringe-Needle U-100 28G X 5/16" 0.5 ML MISC Use with lantus once a day 12/06/18   LoCarollee HerterYvAlferd ApaDO  NONFORMULARY OR COMPOUNDED ITEM Pt/ inr   Dx dvt   Tomorrow 04/05/2019  Please call office [phone] results 04/04/19   LoCarollee HerterYvAlferd ApaDO  NONFORMULARY OR COMPOUNDED ITEM PT/ inr    Dx dvt 04/08/19   LoCarollee HerterYvAlferd ApaDO  NONFORMULARY OR COMPOUNDED ITEM Compression stockings  20-30 mm/hg  #1   Dx low ext edema 05/28/19   LoRoma Schanz, DO  OXYGEN Inhale 3 L into the lungs continuous.     [provider]    Allergies    Hydrocodone and Norvasc [amlodipine besylate]  Review of Systems   Review of Systems  Respiratory: Positive for shortness of breath.   Gastrointestinal: Positive for blood in stool.  Neurological: Positive for dizziness and light-headedness.  All other systems reviewed and are negative.   Physical Exam Updated Vital Signs BP (!) 149/70   Pulse (!) 108   Temp 97.7 F (36.5 C) (Oral)   Resp 20   Ht 5' 3" (1.6 m)   Wt 122.5 kg   SpO2 100%   BMI 47.83 kg/m   Physical Exam Vitals and nursing note reviewed. Exam conducted with a chaperone present.  Constitutional:  General: She is not in acute distress.    Appearance: She is  well-developed.     Comments: Obese female who appears pale, but in no distress  HENT:     Head: Normocephalic and atraumatic.  Eyes:     Conjunctiva/sclera: Conjunctivae normal.     Pupils: Pupils are equal, round, and reactive to light.     Comments: Conjunctival pallor  Cardiovascular:     Rate and Rhythm: Regular rhythm. Tachycardia present.     Pulses: Normal pulses.     Comments: Tachycardic around 110 Pulmonary:     Effort: Pulmonary effort is normal. No respiratory distress.     Breath sounds: Normal breath sounds. No wheezing.     Comments: Speaking in full sentences.  Sats stable on home O2 at rest.  With minimal exertion, patient appears short of breath. ?mildly diminished lung sounds at bases, but exam limited due to body habitus Abdominal:     General: There is no distension.     Palpations: Abdomen is soft. There is no mass.     Tenderness: There is no abdominal tenderness. There is no guarding or rebound.     Comments: No focal tenderness.  Obese abdomen.  Genitourinary:    Rectum: Guaiac result positive.     Comments: Dark red stool noted on rectal exam.  No active hemorrhage. Musculoskeletal:        General: Normal range of motion.     Cervical back: Normal range of motion and neck supple.     Right lower leg: No edema.     Left lower leg: No edema.  Skin:    General: Skin is warm and dry.     Capillary Refill: Capillary refill takes less than 2 seconds.  Neurological:     Mental Status: She is alert and oriented to person, place, and time.     ED Results / Procedures / Treatments   Labs (all labs ordered are listed, but only abnormal results are displayed) Labs Reviewed  CBC WITH DIFFERENTIAL/PLATELET - Abnormal; Notable for the following components:      Result Value   RBC 1.89 (*)    Hemoglobin 5.5 (*)    HCT 18.3 (*)    RDW 24.3 (*)    nRBC 1.0 (*)    All other components within normal limits  COMPREHENSIVE METABOLIC PANEL - Abnormal; Notable for  the following components:   Potassium 3.1 (*)    Glucose, Bld 343 (*)    BUN 29 (*)    Creatinine, Ser 1.61 (*)    Calcium 8.6 (*)    Total Protein 6.4 (*)    Albumin 2.7 (*)    GFR calc non Af Amer 33 (*)    GFR calc Af Amer 38 (*)    All other components within normal limits  POC OCCULT BLOOD, ED - Abnormal; Notable for the following components:   Fecal Occult Bld POSITIVE (*)    All other components within normal limits  SARS CORONAVIRUS 2 (TAT 6-24 HRS)  BRAIN NATRIURETIC PEPTIDE  HEMOGLOBIN A1C  HIV ANTIBODY (ROUTINE TESTING W REFLEX)  HEMOGLOBIN AND HEMATOCRIT, BLOOD  HEMOGLOBIN AND HEMATOCRIT, BLOOD  TYPE AND SCREEN  PREPARE RBC (CROSSMATCH)  TROPONIN I (HIGH SENSITIVITY)    EKG EKG Interpretation  Date/Time:  Wednesday November 13 2019 03:27:29 EST Ventricular Rate:  110 PR Interval:    QRS Duration: 86 QT Interval:  412 QTC Calculation: 558 R Axis:   43 Text Interpretation: Sinus tachycardia  Borderline T abnormalities, diffuse leads Prolonged QT interval No significant change since last tracing Confirmed by Pryor Curia [phone]) on 11/13/2019 4:18:36 AM   Radiology DG Chest 2 View  Result Date: 11/13/2019 CLINICAL DATA:  Shortness of breath EXAM: CHEST - 2 VIEW COMPARISON:  August 10, 2019 FINDINGS: The heart size and mediastinal contours are within normal limits. Both lungs are clear. The visualized skeletal structures are unremarkable. IMPRESSION: No active cardiopulmonary disease. Electronically Signed   By: Prudencio Pair M.D.   On: 11/13/2019 04:06    Procedures .Critical Care Performed by: Franchot Heidelberg, PA-C Authorized by: Franchot Heidelberg, PA-C   Critical care provider statement:    Critical care time (minutes):  45   Critical care time was exclusive of:  Separately billable procedures and treating other patients and teaching time   Critical care was necessary to treat or prevent imminent or life-threatening deterioration of the following  conditions:  Circulatory failure   Critical care was time spent personally by me on the following activities:  Blood draw for specimens, development of treatment plan with patient or surrogate, evaluation of patient's response to treatment, examination of patient, obtaining history from patient or surrogate, ordering and performing treatments and interventions, ordering and review of laboratory studies, ordering and review of radiographic studies, pulse oximetry, re-evaluation of patient's condition and review of old charts   I assumed direction of critical care for this patient from another provider in my specialty: no   Comments:     Pt with critically low hgb. Symptomatic anemia causing tachycardia and SOB. Requiring emergent blood transfusion and admission to the hospital.    (including critical care time)  Medications Ordered in ED Medications  0.9 %  sodium chloride infusion (Manually program via Guardrails IV Fluids) (has no administration in time range)  insulin glargine (LANTUS) injection 20 Units (has no administration in time range)  insulin aspart (novoLOG) injection 0-15 Units (has no administration in time range)  insulin aspart (novoLOG) injection 0-5 Units (has no administration in time range)  acetaminophen (TYLENOL) tablet 650 mg (has no administration in time range)    Or  acetaminophen (TYLENOL) suppository 650 mg (has no administration in time range)  ondansetron (ZOFRAN) tablet 4 mg (has no administration in time range)    Or  ondansetron (ZOFRAN) injection 4 mg (has no administration in time range)    ED Course  I have reviewed the triage vital signs and the nursing notes.  Pertinent labs & imaging results that were available during my care of the patient were reviewed by me and considered in my medical decision making (see chart for details).    MDM Rules/Calculators/A&P                      Pt presenting for evaluation of rectal bleeding and SOB. On exam, pt  appears pale. She is tachycardic, tachypneic, and has SON with minimal exertion. Rectal exam shows blood in stool. I am concerned about symptomatic anemia. Especially with her history. I am also concerned for PE, as pt had a DVT 6 months ago, is tachycaridc and hypoxic with exertion (per EMS). Will obtain labs, ekg, cxr, and CTA.   Labs interpreted by me, pt with critically low hgb. Will order blood transfusion. Otherwise labs are close to baseline. Case discussed with attending, Dr. Leonides Schanz evaluated the pt. Will call for admission.   Discussed with Dr. Fabio Neighbors from [facility], pt to be admitted.   Final Clinical  Impression(s) / ED Diagnoses Final diagnoses:  Symptomatic anemia  Rectal bleeding    Rx / DC Orders ED Discharge Orders    None       Franchot Heidelberg, PA-C 11/13/19 0507    Ward, Delice Bison, DO 11/13/19 [phone]

## 2019-11-13 NOTE — Progress Notes (Signed)
Patient seen and examined this morning, admitted overnight by Dr. Alcario Drought, H&P reviewed and agree with the assessment and plan.  Brief, 69 year old female with history of DM 2, HTN, COPD on chronic 3 L nasal cannula at home, morbid obesity, who was admitted to the hospital with bright red blood per rectum for the past day.  She had a similar episode last December for which she was hospitalized, colonoscopy at that time did not reveal any source but did show diverticulosis  Lower GI bleed with acute blood loss anemia -She is to be transfused 2 unit of packed red blood cells, monitor hemoglobin, CTA on admission without evidence of bleeding source -Consulted GI, appreciate input  Chronic hypoxic respiratory failure -She is at her baseline oxygen with 3 L, satting upper 90s.  CTA of the chest negative for PE.  Essential hypertension -Continue home medications  DM2 -Continue Lantus/sliding scale  CKD stage IIIb -Baseline creatinine 1.3-1.8, currently at baseline  Morbid obesity -Based on a BMI of 47, will benefit from weight loss   Justa Hatchell M. Cruzita Lederer, MD, PhD [facility]  Between 7 am - 7 pm I am available, please contact me via Amion or Securechat Between 7 pm - 7 am I am not available, please contact night coverage MD/APP via Amion

## 2019-11-13 NOTE — Consult Note (Signed)
Reason for Consult: Lower GI bleeding Referring Physician: Hospital team  Sue [name] is an 69 y.o. female.  HPI: Patient with some maroon stools that started on Friday and was about 5 times a day through the weekend and she normally goes just once a day and she did not see any clots and she is not on any aspirin or blood thinners or nonsteroidals and she is familiar to my partner Dr. Michail Sermon and I saw her and did a colonoscopy in December and she has left-sided diverticuli and she denies any pain and did not have any obvious cause of this bleeding and she does not think she had any bleeding episodes other than this when before she was on blood thinners and she has no other complaints and she is only gone 1 time today which was darker  Past Medical History:  Diagnosis Date  . Allergic rhinitis   . Depression   . Emphysema of lung ([place])    3L home O2  . GERD (gastroesophageal reflux disease)   . Hypertension   . Hypothyroidism   . Obesity, morbid, BMI 50 or higher ([place])   . Stroke [facility]) 2016   TIA   . Urine incontinence     Past Surgical History:  Procedure Laterality Date  . ABDOMINAL HYSTERECTOMY    . CESAREAN SECTION    . COLONOSCOPY N/A 08/19/2019   Procedure: COLONOSCOPY;  Surgeon: Clarene Essex, MD;  Location: [facility]COPY;  Service: Endoscopy;  Laterality: N/A;  . COLONOSCOPY WITH PROPOFOL N/A 08/05/2019   Procedure: COLONOSCOPY WITH PROPOFOL;  Surgeon: Wilford Corner, MD;  Location: [facility]OS[facility];  Service: Gastroenterology;  Laterality: N/A;    Family History  Problem Relation Age of Onset  . Heart disease Father        MVP and Pics Valve  . Hypertension Father   . Depression Father        Institutionalized x's 2 years  . Bipolar disorder Father   . Hypertension Sister   . Diabetes Sister   . Hyperlipidemia Sister   . Heart disease Sister 58       MI  . Heart disease Brother   . Hypertension Brother   . Heart disease Paternal Grandmother   . Heart disease  Paternal Aunt   . Heart disease Paternal Uncle   . Schizophrenia Paternal Aunt   . Asthma Son   . Asthma Son     Social History:  reports that she quit smoking about 8 years ago. Her smoking use included cigarettes. She started smoking about 47 years ago. She has a 40.00 pack-year smoking history. She has never used smokeless tobacco. She reports previous alcohol use. She reports that she does not use drugs.  Allergies:  Allergies  Allergen Reactions  . Hydrocodone Other (See Comments)    Constipation, hallucinations  . Norvasc [Amlodipine Besylate]     Marked swelling    Medications: I have reviewed the patient's current medications.  Results for orders placed or performed during the hospital encounter of 11/13/19 (from the past 48 hour(s))  CBC with Differential     Status: Abnormal   Collection Time: 11/13/19  3:44 AM  Result Value Ref Range   WBC 9.3 4.0 - 10.5 K/uL   RBC 1.89 (L) 3.87 - 5.11 MIL/uL   Hemoglobin 5.5 (LL) 12.0 - 15.0 g/dL    Comment: REPEATED TO VERIFY THIS CRITICAL RESULT HAS VERIFIED AND BEEN CALLED TO RN HICKS BY [facility] [place] ON 03 03 2021 AT  0404, AND HAS BEEN READ BACK.     HCT 18.3 (L) 36.0 - 46.0 %   MCV 96.8 80.0 - 100.0 fL   MCH 29.1 26.0 - 34.0 pg   MCHC 30.1 30.0 - 36.0 g/dL   RDW 24.3 (H) 11.5 - 15.5 %   Platelets 176 150 - 400 K/uL   nRBC 1.0 (H) 0.0 - 0.2 %   Neutrophils Relative % 60 %   Neutro Abs 5.6 1.7 - 7.7 K/uL   Lymphocytes Relative 31 %   Lymphs Abs 2.9 0.7 - 4.0 K/uL   Monocytes Relative 7 %   Monocytes Absolute 0.6 0.1 - 1.0 K/uL   Eosinophils Relative 1 %   Eosinophils Absolute 0.1 0.0 - 0.5 K/uL   Basophils Relative 0 %   Basophils Absolute 0.0 0.0 - 0.1 K/uL   Immature Granulocytes 1 %   Abs Immature Granulocytes 0.07 0.00 - 0.07 K/uL   Polychromasia PRESENT     Comment: Performed at [facility], [place] [address], [place], [place] [address]  Comprehensive metabolic panel     Status: Abnormal    Collection Time: 11/13/19  3:44 AM  Result Value Ref Range   Sodium 138 135 - 145 mmol/L   Potassium 3.1 (L) 3.5 - 5.1 mmol/L   Chloride 102 98 - 111 mmol/L   CO2 25 22 - 32 mmol/L   Glucose, Bld 343 (H) 70 - 99 mg/dL    Comment: Glucose reference range applies only to samples taken after fasting for at least 8 hours.   BUN 29 (H) 8 - 23 mg/dL   Creatinine, Ser 1.61 (H) 0.44 - 1.00 mg/dL   Calcium 8.6 (L) 8.9 - 10.3 mg/dL   Total Protein 6.4 (L) 6.5 - 8.1 g/dL   Albumin 2.7 (L) 3.5 - 5.0 g/dL   AST 18 15 - 41 U/L   ALT 12 0 - 44 U/L   Alkaline Phosphatase 116 38 - 126 U/L   Total Bilirubin 0.4 0.3 - 1.2 mg/dL   GFR calc non Af Amer 33 (L) >60 mL/min   GFR calc Af Amer 38 (L) >60 mL/min   Anion gap 11 5 - 15    Comment: Performed at [facility], [place] [address] [place], [place] [address]  Type and screen [place]     Status: None (Preliminary result)   Collection Time: 11/13/19  3:44 AM  Result Value Ref Range   ABO/RH(D) O POS    Antibody Screen NEG    Sample Expiration 11/16/2019,2359    Unit Number [id number]    Blood Component Type RED CELLS,LR    Unit division 00    Status of Unit ISSUED    Transfusion Status OK TO TRANSFUSE    Crossmatch Result      Compatible Performed at [facility], [address] [place], [place] [address]    Unit Number [id number]    Blood Component Type RED CELLS,LR    Unit division 00    Status of Unit ISSUED    Transfusion Status OK TO TRANSFUSE    Crossmatch Result Compatible    Unit Number [id number]    Blood Component Type RBC LR PHER2    Unit division 00    Status of Unit ALLOCATED    Transfusion Status OK TO TRANSFUSE    Crossmatch Result Compatible   Troponin I (High Sensitivity)     Status: None   Collection Time: 11/13/19  3:44 AM  Result Value Ref Range   Troponin I (High Sensitivity) 9 <18 ng/L    Comment: (NOTE) Elevated high sensitivity troponin I  (hsTnI) values and significant  changes across serial measurements may suggest ACS but many other  chronic and acute conditions are known to elevate hsTnI results.  Refer to the "Links" section for chest pain algorithms and additional  guidance. Performed at [facility], [place] [address] [place], [place] [address]   Brain natriuretic peptide     Status: None   Collection Time: 11/13/19  3:45 AM  Result Value Ref Range   B Natriuretic Peptide 30.4 0.0 - 100.0 pg/mL    Comment: Performed at [facility], [place] [address], [place], [place] [address]  POC occult blood, ED Provider will collect     Status: Abnormal   Collection Time: 11/13/19  3:51 AM  Result Value Ref Range   Fecal Occult Bld POSITIVE (A) NEGATIVE  Prepare RBC     Status: None   Collection Time: 11/13/19  4:10 AM  Result Value Ref Range   Order Confirmation      ORDER PROCESSED BY BLOOD BANK Performed at [place] [address], [place], [place] [address]   SARS CORONAVIRUS 2 (TAT 6-24 HRS) Nasopharyngeal Nasopharyngeal Swab     Status: None   Collection Time: 11/13/19  4:13 AM   Specimen: Nasopharyngeal Swab  Result Value Ref Range   SARS Coronavirus 2 NEGATIVE NEGATIVE    Comment: (NOTE) SARS-CoV-2 target nucleic acids are NOT DETECTED. The SARS-CoV-2 RNA is generally detectable in upper and lower respiratory specimens during the acute phase of infection. Negative results do not preclude SARS-CoV-2 infection, do not rule out co-infections with other pathogens, and should not be used as the sole basis for treatment or other patient management decisions. Negative results must be combined with clinical observations, patient history, and epidemiological information. The expected result is Negative. Fact Sheet for Patients: SugarRoll.be Fact Sheet for Healthcare Providers: https://www.woods-mathews.com/ This test is  not yet approved or cleared by the [place] FDA and  has been authorized for detection and/or diagnosis of SARS-CoV-2 by FDA under an Emergency Use Authorization (EUA). This EUA will remain  in effect (meaning this test can be used) for the duration of the COVID-19 declaration under Section 56 4(b)(1) of the Act, 21 U.S.C. section 360bbb-3(b)(1), unless the authorization is terminated or revoked sooner. Performed at [facility], [place] [address], [place], [place] [address]   HIV Antibody (routine testing w rflx)     Status: None   Collection Time: 11/13/19  5:09 AM  Result Value Ref Range   HIV Screen 4th Generation wRfx NON REACTIVE NON REACTIVE    Comment: Performed at [facility] [address], [place], [place] [address]  Troponin I (High Sensitivity)     Status: None   Collection Time: 11/13/19  5:45 AM  Result Value Ref Range   Troponin I (High Sensitivity) 14 <18 ng/L    Comment: (NOTE) Elevated high sensitivity troponin I (hsTnI) values and significant  changes across serial measurements may suggest ACS but many other  chronic and acute conditions are known to elevate hsTnI results.  Refer to the "Links" section for chest pain algorithms and additional  guidance. Performed at [facility], [place] [address] [place], [place] [address]   MRSA PCR Screening     Status: None   Collection Time: 11/13/19  8:06 AM   Specimen: Nasal Mucosa; Nasopharyngeal  Result Value Ref Range   MRSA by PCR NEGATIVE NEGATIVE    Comment:        The GeneXpert MRSA Assay (FDA approved for NASAL specimens only), is one component of a comprehensive MRSA colonization surveillance program. It is not intended to diagnose MRSA infection nor to guide or monitor treatment for MRSA infections. Performed at [facility], [place] [address] [place], [place] [address]     DG Chest 2 View  Result Date: 11/13/2019 CLINICAL DATA:  Shortness of breath  EXAM: CHEST - 2 VIEW COMPARISON:  August 10, 2019 FINDINGS: The heart size and mediastinal contours are within normal limits. Both lungs are clear. The visualized skeletal structures are unremarkable. IMPRESSION: No active cardiopulmonary disease. Electronically Signed   By: Prudencio Pair M.D.   On: 11/13/2019 04:06   CT Angio Chest PE W and/or Wo Contrast  Result Date: 11/13/2019 CLINICAL DATA:  Shortness of breath, low hemoglobin, history of GI bleed EXAM: CT ANGIOGRAPHY CHEST WITH CONTRAST TECHNIQUE: Multidetector CT imaging of the chest was performed using the standard protocol during bolus administration of intravenous contrast. Multiplanar CT image reconstructions and MIPs were obtained to evaluate the vascular anatomy. CONTRAST:  102mL OMNIPAQUE IOHEXOL 350 MG/ML SOLN COMPARISON:  August 02, 2019 FINDINGS: Cardiovascular: There is a optimal opacification of the pulmonary arteries. There is no central,segmental, or subsegmental filling defects within the pulmonary arteries. The heart is normal in size. No pericardial effusion or thickening. No evidence right heart strain. There is normal three-vessel brachiocephalic anatomy without proximal stenosis. The thoracic aorta is normal in appearance. Mediastinum/Nodes: No hilar, mediastinal, or axillary adenopathy. Thyroid gland, trachea, and esophagus demonstrate no significant findings. Lungs/Pleura: Minimal bibasilar dependent atelectasis is noted. No pleural effusion or pneumothorax. No airspace consolidation. Upper Abdomen: No acute abnormalities present in the visualized portions of the upper abdomen. Musculoskeletal: No chest wall abnormality. No acute or significant osseous findings. Review of the MIP images confirms the above findings. Abdomen/pelvis: Hepatobiliary: The liver is normal in density without focal abnormality.The main portal vein is patent. Layering calcified gallstones are present. Pancreas: Unremarkable. No pancreatic ductal dilatation or  surrounding inflammatory changes. Spleen: Normal in size without focal abnormality. Adrenals/Urinary Tract: Both adrenal glands appear normal. The kidneys and collecting system appear normal without evidence of urinary tract calculus or hydronephrosis. Bladder is unremarkable. Stomach/Bowel: The stomach, small bowel, and colon are normal in appearance. Scattered colonic diverticula are noted without surrounding inflammatory changes. No inflammatory changes, wall thickening, or obstructive findings.The appendix is normal. Vascular/Lymphatic: There are no enlarged mesenteric, retroperitoneal, or pelvic lymph nodes. Scattered aortic atherosclerotic calcifications are seen without aneurysmal dilatation. Reproductive: The patient is status post hysterectomy. No adnexal masses or collections seen. Other: No evidence of abdominal wall mass or hernia. Musculoskeletal: No acute or significant osseous findings. IMPRESSION: No central, segmental, or subsegmental pulmonary embolism. No acute intrathoracic, abdominal, or pelvic pathology to explain the patient's symptoms. Diverticulosis without diverticulitis. Cholelithiasis Aortic Atherosclerosis (ICD10-I70.0). Electronically Signed   By: Prudencio Pair M.D.   On: 11/13/2019 06:08   CT ABDOMEN PELVIS W CONTRAST  Result Date: 11/13/2019 CLINICAL DATA:  Shortness of breath, low hemoglobin, history of GI bleed EXAM: CT ANGIOGRAPHY CHEST WITH CONTRAST TECHNIQUE: Multidetector CT imaging of the chest was performed using the standard protocol during bolus administration of intravenous contrast. Multiplanar CT image reconstructions and MIPs were obtained to evaluate the vascular anatomy. CONTRAST:  56mL OMNIPAQUE IOHEXOL 350 MG/ML  SOLN COMPARISON:  August 02, 2019 FINDINGS: Cardiovascular: There is a optimal opacification of the pulmonary arteries. There is no central,segmental, or subsegmental filling defects within the pulmonary arteries. The heart is normal in size. No  pericardial effusion or thickening. No evidence right heart strain. There is normal three-vessel brachiocephalic anatomy without proximal stenosis. The thoracic aorta is normal in appearance. Mediastinum/Nodes: No hilar, mediastinal, or axillary adenopathy. Thyroid gland, trachea, and esophagus demonstrate no significant findings. Lungs/Pleura: Minimal bibasilar dependent atelectasis is noted. No pleural effusion or pneumothorax. No airspace consolidation. Upper Abdomen: No acute abnormalities present in the visualized portions of the upper abdomen. Musculoskeletal: No chest wall abnormality. No acute or significant osseous findings. Review of the MIP images confirms the above findings. Abdomen/pelvis: Hepatobiliary: The liver is normal in density without focal abnormality.The main portal vein is patent. Layering calcified gallstones are present. Pancreas: Unremarkable. No pancreatic ductal dilatation or surrounding inflammatory changes. Spleen: Normal in size without focal abnormality. Adrenals/Urinary Tract: Both adrenal glands appear normal. The kidneys and collecting system appear normal without evidence of urinary tract calculus or hydronephrosis. Bladder is unremarkable. Stomach/Bowel: The stomach, small bowel, and colon are normal in appearance. Scattered colonic diverticula are noted without surrounding inflammatory changes. No inflammatory changes, wall thickening, or obstructive findings.The appendix is normal. Vascular/Lymphatic: There are no enlarged mesenteric, retroperitoneal, or pelvic lymph nodes. Scattered aortic atherosclerotic calcifications are seen without aneurysmal dilatation. Reproductive: The patient is status post hysterectomy. No adnexal masses or collections seen. Other: No evidence of abdominal wall mass or hernia. Musculoskeletal: No acute or significant osseous findings. IMPRESSION: No central, segmental, or subsegmental pulmonary embolism. No acute intrathoracic, abdominal, or pelvic  pathology to explain the patient's symptoms. Diverticulosis without diverticulitis. Cholelithiasis Aortic Atherosclerosis (ICD10-I70.0). Electronically Signed   By: Prudencio Pair M.D.   On: 11/13/2019 06:08    Review of Systems negative except above Blood pressure 107/78, pulse 92, temperature 97.8 F (36.6 C), resp. rate (!) 24, height 5\' 3"  (1.6 m), weight 122.5 kg, SpO2 100 %. Physical Exam vital signs stable afebrile no acute distress exam pertinent for her abdomen being soft nontender CT reviewed BUN and creatinine slight increase over baseline from November creatinine about the same in December hemoglobin on admission 5.5  Assessment/Plan: Almost certainly diverticular bleeding Plan: Agree with clear liquids would recommend a nuclear bleeding scan if signs of bleeding increases otherwise if no further bleeding can advance diet tomorrow or Friday and follow-up with her primary gastroenterologist Dr. Michail Sermon and we briefly discussed surgical options on her diverticuli particularly if this is a recurrent problem  Trevor Duty E 11/13/2019, 2:35 PM

## 2019-11-13 NOTE — ED Notes (Signed)
Report called to [facility] RN on 2W. Pt is a/o vss in no acute distress and ready for transport. Blood currently infusing and pt tolerating well.

## 2019-11-13 NOTE — ED Triage Notes (Signed)
Pt presents via EMS for increased SOB and rectal bleeding. Pt has COPD and history of GI bleeding. Pt maroon colored  bleeding rectally when using bathroom and when coughing x 3 days. Pt got up to go to bathroom and she felt dizzy. Upon EMS arrival on scene pt oxygen saturation 80 % upon exertion. Pt always wears 3 L of oxygen but says the SOB is worse than normal. Pt O2 100 on 3 L upon arrival to ER.

## 2019-11-13 NOTE — ED Notes (Signed)
Pt is a/o vss in no acute distress at fifteen minutes since beginning of blood transfusion. Pt tolerating transfusion well.

## 2019-11-13 NOTE — ED Notes (Signed)
Blood transfusion started. Two RNs at bedside. VSS. Pt tolerating well

## 2019-11-13 NOTE — H&P (Addendum)
History and Physical    Sue [name] JSR:[id number] DOB: 12-10-1950 DOA: 11/13/2019  PCP: Ann Held, DO  Patient coming from: Home  I have personally briefly reviewed patient's old medical records in [place]  Chief Complaint: BRBPR  HPI: Sue [name] is a 69 y.o. female with medical history significant of DM2, HTN, COPD on 3L home O2 at baseline, morbid obesity.  Patient had DVT last summer following leg trauma, was on coumadin for several months until she developed LGIB in Nov and December, coumadin stopped.  EGD in Nov, colonoscopy in Dec: insufficent prep, blood throughout colon, some small scattered diverticula in sigmoid, recd repeating colonoscoy as outpt with better prep and if she re-bled to get CTA abd/pelvis to try and localize bleed.  Today presents to ED with severe BRBPR.  Initially there was minimal blood when she had a bowel movement and wiped, but she states bleeding has worsened.  She is now having some rectal bleeding anytime she coughs or increases her abdominal pressure.  Today patient states she started to feel dizzy and lightheaded.  This is worse when she goes from sitting to standing.  She states she feels like she is going to pass out when she does that.  She is not on blood thinners currently.  Additionally, patient states she is feeling short of breath.  She is mostly short of breath with exertion.  She has not felt shortness of breath with her previous rectal bleeding episodes.  She denies chest pain, fever, cough.  She states this does not feel like a COPD exacerbation and does not feel like her CHF.  She denies significant leg swelling. She wears 3L O2 at baseline, has not needed to increase recently.   Sats drop to 80% with ambulation. Per EMS.   ED Course: HGB 5.5 down significantly from 10.5 just 5 days ago!  Tachy to 110s.  BP actually on high side (160/110).  CXR neg.  CTA chest PE pending.  3u PRBC transfusion  ordered.  Hospitalist asked to admit.   Review of Systems: As per HPI, otherwise all review of systems negative.  Past Medical History:  Diagnosis Date  . Allergic rhinitis   . Depression   . Emphysema of lung ([place])    3L home O2  . GERD (gastroesophageal reflux disease)   . Hypertension   . Hypothyroidism   . Obesity, morbid, BMI 50 or higher ([place])   . Stroke [facility]) 2016   TIA   . Urine incontinence     Past Surgical History:  Procedure Laterality Date  . ABDOMINAL HYSTERECTOMY    . CESAREAN SECTION    . COLONOSCOPY N/A 08/19/2019   Procedure: COLONOSCOPY;  Surgeon: Clarene Essex, MD;  Location: [facility] ENDOSCOPY;  Service: Endoscopy;  Laterality: N/A;  . COLONOSCOPY WITH PROPOFOL N/A 08/05/2019   Procedure: COLONOSCOPY WITH PROPOFOL;  Surgeon: Wilford Corner, MD;  Location: [facility] ENDOSCOPY;  Service: Gastroenterology;  Laterality: N/A;     reports that she quit smoking about 8 years ago. Her smoking use included cigarettes. She started smoking about 47 years ago. She has a 40.00 pack-year smoking history. She has never used smokeless tobacco. She reports previous alcohol use. She reports that she does not use drugs.  Allergies  Allergen Reactions  . Hydrocodone Other (See Comments)    Constipation, hallucinations  . Norvasc [Amlodipine Besylate]     Marked swelling    Family History  Problem Relation Age of Onset  .  Heart disease Father        MVP and Pics Valve  . Hypertension Father   . Depression Father        Institutionalized x's 2 years  . Bipolar disorder Father   . Hypertension Sister   . Diabetes Sister   . Hyperlipidemia Sister   . Heart disease Sister 97       MI  . Heart disease Brother   . Hypertension Brother   . Heart disease Paternal Grandmother   . Heart disease Paternal Aunt   . Heart disease Paternal Uncle   . Schizophrenia Paternal Aunt   . Asthma Son   . Asthma Son      Prior to Admission medications   Medication Sig Start Date End Date  Taking? Authorizing Provider  albuterol (PROVENTIL HFA;VENTOLIN HFA) 108 (90 Base) MCG/ACT inhaler Inhale 1-2 puffs into the lungs every 6 (six) hours as needed for wheezing or shortness of breath.   Yes [provider]  albuterol (PROVENTIL) (2.5 MG/3ML) 0.083% nebulizer solution Take 3 mLs (2.5 mg total) by nebulization every 6 (six) hours as needed for wheezing or shortness of breath. 03/31/17  Yes Carollee Herter, Yvonne R, DO  busPIRone (BUSPAR) 15 MG tablet Take 15 mg by mouth daily.  12/27/17  Yes [provider]  famotidine (PEPCID) 20 MG tablet Take 20 mg by mouth daily as needed for heartburn or indigestion.   Yes [provider]  fluticasone (FLONASE) 50 MCG/ACT nasal spray Place 2 sprays into both nostrils daily as needed for allergies. 05/02/17  Yes Johnson, Clanford L, MD  glipiZIDE (GLUCOTROL) 5 MG tablet Take 1 tablet (5 mg total) by mouth 2 (two) times daily before a meal. 05/29/19  Yes Lowne Chase, Yvonne R, DO  LANTUS 100 UNIT/ML injection INJECT 30 UNITS SUBCUTANEOUSLY AT BEDTIME Patient taking differently: Inject 30 Units into the skin daily.  11/04/19  Yes Ann Held, DO  levothyroxine (SYNTHROID) 50 MCG tablet Take 1 tablet by mouth once daily 07/01/19  Yes Lowne Lyndal Pulley R, DO  lisinopril (ZESTRIL) 20 MG tablet Take 1 tablet by mouth once daily Patient taking differently: Take 20 mg by mouth daily.  05/03/19  Yes Roma Schanz R, DO  LORazepam (ATIVAN) 1 MG tablet Take 0.5-1.5 mg by mouth See admin instructions. Take 0.37m by mouth in the morning and 1.575mby mouth at bedtime as needed for anxiety.   Yes [provider]  metFORMIN (GLUCOPHAGE) 500 MG tablet Take 2 tablets (1,000 mg total) by mouth 2 (two) times daily with a meal. 09/24/19 09/23/20 Yes Lowne ChLyndal Pulley, DO  metoprolol succinate (TOPROL-XL) 50 MG 24 hr tablet Take with or immediately following a meal. Patient taking differently: Take 50 mg by mouth daily. Take  with or immediately following a meal. 09/05/19  Yes LoRoma Schanz, DO  potassium chloride SA (KLOR-CON) 20 MEQ tablet Take 1 tablet by mouth once daily 10/07/19  Yes Lowne Chase, Yvonne R, DO  QUEtiapine (SEROQUEL) 100 MG tablet Take 100 mg by mouth at bedtime. 02/23/19  Yes [provider]  torsemide (DEMADEX) 20 MG tablet Take 2 tablets by mouth once daily 11/07/19  Yes Lowne Chase, Yvonne R, DO  zolpidem (AMBIEN) 10 MG tablet Take 10 mg by mouth at bedtime. 06/20/19  Yes [provider]  Accu-Chek FastClix Lancets MISC USE TO CHECK BLOOD SUGAR UP TO FOUR TIMES DAILY AS DIRECTED 07/15/19   LoAnn HeldDO  ACCU-CHEK GUIDE test strip USE TO CHECK BLOOD SUGAR UP TO FOUR TIMES DAILY 05/28/19   Carollee Herter, Alferd Apa, DO  blood glucose meter kit and supplies KIT Dispense based on patient and insurance preference. Use up to four times daily as directed. (FOR ICD-9 250.00, 250.01). 10/04/18   Carollee Herter, Alferd Apa, DO  Insulin Syringe-Needle U-100 28G X 5/16" 0.5 ML MISC Use with lantus once a day 12/06/18   Carollee Herter, Alferd Apa, DO  NONFORMULARY OR COMPOUNDED ITEM Pt/ inr   Dx dvt   Tomorrow 04/05/2019  Please call office [phone] with results 04/04/19   Carollee Herter, Alferd Apa, DO  NONFORMULARY OR COMPOUNDED ITEM PT/ inr    Dx dvt 04/08/19   Carollee Herter, Alferd Apa, DO  NONFORMULARY OR COMPOUNDED ITEM Compression stockings  20-30 mm/hg  #1   Dx low ext edema 05/28/19   Roma Schanz R, DO  OXYGEN Inhale 3 L into the lungs continuous.     [provider]    Physical Exam: Vitals:   11/13/19 0329 11/13/19 0330 11/13/19 0457 11/13/19 0513  BP: (!) 162/68  (!) 149/70 (!) 163/111  Pulse: (!) 111  (!) 108 (!) 104  Resp: (!) 26  20   Temp: 97.7 F (36.5 C)  97.7 F (36.5 C) (!) 97.4 F (36.3 C)  TempSrc: Oral  Oral Oral  SpO2: 100%   100%  Weight:  122.5 kg    Height:  '5\' 3"'  (1.6 m)      Constitutional: NAD, calm, comfortable Eyes: PERRL, lids and  conjunctivae normal ENMT: Mucous membranes are moist. Posterior pharynx clear of any exudate or lesions.Normal dentition.  Neck: normal, supple, no masses, no thyromegaly Respiratory: clear to auscultation bilaterally, no wheezing, no crackles. Normal respiratory effort. No accessory muscle use.  Cardiovascular: Regular rate and rhythm, no murmurs / rubs / gallops. No extremity edema. 2+ pedal pulses. No carotid bruits.  Abdomen: no tenderness, no masses palpated. No hepatosplenomegaly. Bowel sounds positive.  Musculoskeletal: no clubbing / cyanosis. No joint deformity upper and lower extremities. Good ROM, no contractures. Normal muscle tone.  Skin: no rashes, lesions, ulcers. No induration Neurologic: CN 2-12 grossly intact. Sensation intact, DTR normal. Strength 5/5 in all 4.  Psychiatric: Normal judgment and insight. Alert and oriented x 3. Normal mood.    Labs on Admission: I have personally reviewed following labs and imaging studies  CBC: Recent Labs  Lab 11/08/19 1113 11/13/19 0344  WBC 6.3 9.3  NEUTROABS 3.1 5.6  HGB 10.1* 5.5*  HCT 31.1* 18.3*  MCV 88.4 96.8  PLT 198.0 578   Basic Metabolic Panel: Recent Labs  Lab 11/13/19 0344  NA 138  K 3.1*  CL 102  CO2 25  GLUCOSE 343*  BUN 29*  CREATININE 1.61*  CALCIUM 8.6*   GFR: Estimated Creatinine Clearance: 43 mL/min (A) (by C-G formula based on SCr of 1.61 mg/dL (H)). Liver Function Tests: Recent Labs  Lab 11/13/19 0344  AST 18  ALT 12  ALKPHOS 116  BILITOT 0.4  PROT 6.4*  ALBUMIN 2.7*   No results for input(s): LIPASE, AMYLASE in the last 168 hours. No results for input(s): AMMONIA in the last 168 hours. Coagulation Profile: No results for input(s): INR, PROTIME in the last 168 hours. Cardiac Enzymes: No results for input(s): CKTOTAL, CKMB, CKMBINDEX, TROPONINI in the last 168 hours. BNP (last 3 results) No results for input(s): PROBNP in the last 8760 hours. HbA1C: No results for input(s): HGBA1C in  the last 72 hours. CBG: No results for input(s): GLUCAP in the last 168 hours. Lipid Profile: No results for input(s): CHOL, HDL, LDLCALC, TRIG, CHOLHDL, LDLDIRECT in the last 72 hours. Thyroid Function Tests: No results for input(s): TSH, T4TOTAL, FREET4, T3FREE, THYROIDAB in the last 72 hours. Anemia Panel: No results for input(s): VITAMINB12, FOLATE, FERRITIN, TIBC, IRON, RETICCTPCT in the last 72 hours. Urine analysis:    Component Value Date/Time   COLORURINE YELLOW 08/15/2018 2201   APPEARANCEUR CLEAR 08/15/2018 2201   LABSPEC <1.005 (L) 08/15/2018 2201   PHURINE 5.5 08/15/2018 2201   GLUCOSEU >=500 (A) 08/15/2018 2201   GLUCOSEU NEGATIVE 11/11/2013 1353   HGBUR NEGATIVE 08/15/2018 2201   BILIRUBINUR NEGATIVE 08/15/2018 2201   BILIRUBINUR neg 10/27/2015 1355   KETONESUR NEGATIVE 08/15/2018 2201   PROTEINUR NEGATIVE 08/15/2018 2201   UROBILINOGEN 0.2 10/27/2015 1355   UROBILINOGEN 0.2 07/29/2014 2300   NITRITE NEGATIVE 08/15/2018 2201   LEUKOCYTESUR NEGATIVE 08/15/2018 2201    Radiological Exams on Admission: DG Chest 2 View  Result Date: 11/13/2019 CLINICAL DATA:  Shortness of breath EXAM: CHEST - 2 VIEW COMPARISON:  August 10, 2019 FINDINGS: The heart size and mediastinal contours are within normal limits. Both lungs are clear. The visualized skeletal structures are unremarkable. IMPRESSION: No active cardiopulmonary disease. Electronically Signed   By: Prudencio Pair M.D.   On: 11/13/2019 04:06    EKG: Independently reviewed.  Assessment/Plan Principal Problem:   Lower GI bleed Active Problems:   HTN (hypertension)   Chronic respiratory failure (HCC)   Morbid obesity due to excess calories (HCC)   Diabetes mellitus type II, uncontrolled (HCC)   CKD (chronic kidney disease) stage 3, GFR 30-59 ml/min   Acute blood loss anemia    1. LGIB with acute blood loss anemia - 1. 3U PRBC transfusion 2. H/H Q6H first at noon 3. Call GI in AM 4. Adding on a CT abd/pelvis  to try and see if we can get lucky and find source of bleed (discussed with rad tech: since they are already doing CTA chest, can add the regular abd/pelvis W contrast on and not need additional contrast load, CANNOT add on CT angio abd/pelvis as this would require additional contrast load which we cant safely do right now due to CKD). 5. Clear liquid diet 2. Acute on chronic resp failure with hypoxia - 1. Tachycardia, SOB and DOE can be explained by the HGB drop. 2. HYPOXIA with exertion on the other hand, doesn't make as much sense here. 3. EDP has ordered CTA chest to r/o PE, will let this go ahead given her history of DVT in June last year, fact that I cant really explain hypoxia very well. 4. Cont pulse ox 3. HTN - 1. Cont metoprolol 2. Cont lisinopril 3. Holding torsemide for the moment given acute blood loss 4. DM2 - 1. Lantus 20u daily (takes 30 at home) 2. Mod scale SSI AC/HS 3. Hold home PO hypoglycemics 5. CKD stage 3- creat at baseline today, repeat BMP tomorrow AM  DVT prophylaxis: SCDs Code Status: Full Family Communication: No family in room Disposition Plan: Home after admit Consults called: None, call GI in AM (unless we get really lucky and CT shows bleeding source, in which case call IR instead). Admission status: Admit to inpatient  Severity of Illness: The appropriate patient status for this patient is INPATIENT. Inpatient status is judged to be reasonable and necessary in order to provide the required intensity of service to ensure the  patient's safety. The patient's presenting symptoms, physical exam findings, and initial radiographic and laboratory data in the context of their chronic comorbidities is felt to place them at high risk for further clinical deterioration. Furthermore, it is not anticipated that the patient will be medically stable for discharge from the hospital within 2 midnights of admission. The following factors support the patient status of  inpatient.   IP status for acute GIB with HGB drop from 10.5 to 5.5 in just 5 days.  Needs transfusion.  Symptomatic anemia.  Etc.  * I certify that at the point of admission it is my clinical judgment that the patient will require inpatient hospital care spanning beyond 2 midnights from the point of admission due to high intensity of service, high risk for further deterioration and high frequency of surveillance required.*    Artia Singley M. DO [facility]  How to contact the [facility] Attending or Consulting provider [place] or covering provider during after hours [place], for this patient?  1. Check the care team in [facility] and look for a) attending/consulting [facility] provider listed and b) the [facility] team listed 2. Log into [email or web address]  Amion Physician Scheduling and messaging for groups and whole hospitals  On call and physician scheduling software for group practices, residents, hospitalists and other medical providers for call, clinic, rotation and shift schedules. OnCall Enterprise is a hospital-wide system for scheduling doctors and paging doctors on call. EasyPlot is for scientific plotting and data analysis.  [email or web address]  and use [place]'s universal password to access. If you do not have the password, please contact the hospital operator.  3. Locate the [facility] provider you are looking for under [facility] and page to a number that you can be directly reached. 4. If you still have difficulty reaching the provider, please page the [facility] (Director on Call) for the Hospitalists listed on amion for assistance.  11/13/2019, 5:22 AM

## 2019-11-13 NOTE — ED Notes (Signed)
Sophia, PA and Ward, MD made aware of pt's positive POC occult

## 2019-11-14 DIAGNOSIS — K625 Hemorrhage of anus and rectum: Secondary | ICD-10-CM

## 2019-11-14 LAB — HEMOGLOBIN A1C
Hgb A1c MFr Bld: 7.9 % — ABNORMAL HIGH (ref 4.8–5.6)
Mean Plasma Glucose: 180 mg/dL

## 2019-11-14 LAB — HEMOGLOBIN AND HEMATOCRIT, BLOOD
HCT: 26.4 % — ABNORMAL LOW (ref 36.0–46.0)
HCT: 26.6 % — ABNORMAL LOW (ref 36.0–46.0)
HCT: 28 % — ABNORMAL LOW (ref 36.0–46.0)
HCT: 29.1 % — ABNORMAL LOW (ref 36.0–46.0)
Hemoglobin: 8.6 g/dL — ABNORMAL LOW (ref 12.0–15.0)
Hemoglobin: 8.7 g/dL — ABNORMAL LOW (ref 12.0–15.0)
Hemoglobin: 8.8 g/dL — ABNORMAL LOW (ref 12.0–15.0)
Hemoglobin: 8.9 g/dL — ABNORMAL LOW (ref 12.0–15.0)

## 2019-11-14 LAB — GLUCOSE, CAPILLARY
Glucose-Capillary: 146 mg/dL — ABNORMAL HIGH (ref 70–99)
Glucose-Capillary: 236 mg/dL — ABNORMAL HIGH (ref 70–99)
Glucose-Capillary: 338 mg/dL — ABNORMAL HIGH (ref 70–99)
Glucose-Capillary: 219 mg/dL — ABNORMAL HIGH (ref 70–99)

## 2019-11-14 LAB — BASIC METABOLIC PANEL
Anion gap: 7 (ref 5–15)
BUN: 25 mg/dL — ABNORMAL HIGH (ref 8–23)
CO2: 25 mmol/L (ref 22–32)
Calcium: 8.4 mg/dL — ABNORMAL LOW (ref 8.9–10.3)
Chloride: 104 mmol/L (ref 98–111)
Creatinine, Ser: 1.54 mg/dL — ABNORMAL HIGH (ref 0.44–1.00)
GFR calc Af Amer: 40 mL/min — ABNORMAL LOW (ref >60)
GFR calc non Af Amer: 35 mL/min — ABNORMAL LOW (ref >60)
Glucose, Bld: 146 mg/dL — ABNORMAL HIGH (ref 70–99)
Potassium: 3.5 mmol/L (ref 3.5–5.1)
Sodium: 136 mmol/L (ref 135–145)

## 2019-11-14 MED ORDER — SODIUM CHLORIDE 0.9 % IV BOLUS
500.0000 mL | Freq: Once | INTRAVENOUS | Status: AC
  Administered 2019-11-14: 500 mL via INTRAVENOUS

## 2019-11-14 NOTE — Progress Notes (Signed)
Report given to Di Kindle, RN on 4E. Pt to transfer to 1416.

## 2019-11-14 NOTE — Progress Notes (Signed)
Patient up to shower this afternoon.  Patient reported feeling short of breath after returning to bed but reports this is normal for her after activity.  O2 sats 97% on 3L [place].  Patient denied dizziness.  Patient resting in bed.  Will continue to monitor.

## 2019-11-14 NOTE — Progress Notes (Signed)
Patient reports black stools this afternoon.  Patient denies nausea/vomiting.  Resting in bed.  Will continue to monitor.

## 2019-11-14 NOTE — Progress Notes (Signed)
Received verbal order from Dr. Watt Climes to advance to a Soft Diet.  Patient notified.

## 2019-11-14 NOTE — Progress Notes (Signed)
PROGRESS NOTE  Sue [name] [id number] DOB: 01/09/1951 DOA: 11/13/2019 PCP: Ann Held, DO   LOS: 1 day   Brief Narrative / Interim history: 69 year old female with history of DM 2, HTN, COPD on chronic 3 L nasal cannula at home, morbid obesity, who was admitted to the hospital with bright red blood per rectum for the past day.  She had a similar episode last December for which she was hospitalized, colonoscopy at that time did not reveal any source but did show diverticulosis  Subjective / 24h Interval events: No further bleeding or bowel movements overnight, no lightheadedness, no dizziness, no chest pain.  Still having intermittent muscle cramps  RN reports soft blood pressures overnight  Assessment & Plan: Principal Problem Lower GI bleed with acute blood loss anemia -Hemoglobin was 5.5 on admission, received a total of 3 units of packed red blood cells, improved appropriately into the 80 range and appears to have been stable since -It appears clinically that her breathing has stopped -Did have an episode of hypotension overnight, continue to monitor in stepdown.  Hold home antihypertensives.  Appreciate Dr. Raphael Gibney input  Active Problems Chronic hypoxic respiratory failure -She is at her baseline oxygen with 3 L, satting upper 90s.  CTA of the chest negative for PE. -There was concern for increased tachypnea last night, underwent a repeat chest x-ray following transfusion, imaging stable  Essential hypertension -Hold home medications because of blood pressures overnight  DM2 -Continue Lantus/sliding scale  CBG (last 3)  Recent Labs    11/13/19 1641 11/13/19 2309 11/14/19 0748  GLUCAP 264* 169* 146*    CKD stage IIIb -Baseline creatinine 1.3-1.8, remains at baseline this morning  Morbid obesity -Based on a BMI of 47, will benefit from weight loss   Scheduled Meds: . sodium chloride   Intravenous Once  . busPIRone  15 mg Oral Daily  .  Chlorhexidine Gluconate Cloth  6 each Topical Daily  . insulin aspart  0-15 Units Subcutaneous TID WC  . insulin aspart  0-5 Units Subcutaneous QHS  . insulin glargine  20 Units Subcutaneous Daily  . levothyroxine  50 mcg Oral Daily  . mouth rinse  15 mL Mouth Rinse BID  . QUEtiapine  100 mg Oral QHS  . zolpidem  5 mg Oral QHS   Continuous Infusions: PRN Meds:.acetaminophen **OR** acetaminophen, cyclobenzaprine, fluticasone, influenza vaccine adjuvanted, LORazepam, LORazepam, morphine injection  DVT prophylaxis: SCDs Code Status: Full code Family Communication: Discussed with patient Patient admitted from: Home Anticipated d/c place: Home Barriers to d/c: Intermittent hypotension, continue to monitor serial hemoglobin due to concern for reactivation of her bleed  Consultants:  Gastroenterology  Procedures:  None   Microbiology  None   Antimicrobials: None     Objective: Vitals:   11/14/19 0357 11/14/19 0400 11/14/19 0600 11/14/19 0800  BP:  104/70 (!) 100/53   Pulse:  79 80   Resp:  17 17   Temp: (!) 97.5 F (36.4 C)   97.8 F (36.6 C)  TempSrc: Oral   Oral  SpO2:  100% 99%   Weight:      Height:        Intake/Output Summary (Last 24 hours) at 11/14/2019 0952 Last data filed at 11/14/2019 0400 Gross per 24 hour  Intake 2515 ml  Output 1000 ml  Net 1515 ml   Filed Weights   11/13/19 0330  Weight: 122.5 kg    Examination:  Constitutional: NAD Eyes: no scleral icterus ENMT: Mucous membranes  are moist.  Neck: normal, supple Respiratory: clear to auscultation bilaterally, no wheezing, no crackles. Normal respiratory effort.  Cardiovascular: Regular rate and rhythm, no murmurs / rubs / gallops. No LE edema.  Abdomen: non distended, no tenderness. Bowel sounds positive.  Musculoskeletal: no clubbing / cyanosis.  Skin: no rashes Neurologic: non focal    Data Reviewed: I have independently reviewed following labs and imaging studies   CBC: Recent Labs   Lab 11/15/19 1113 11/13/19 0344 11/13/19 2050 11/14/19 0036 11/14/19 0621  WBC 6.3 9.3  --   --   --   NEUTROABS 3.1 5.6  --   --   --   HGB 10.1* 5.5* 9.2* 8.8* 8.9*  HCT 31.1* 18.3* 29.3* 26.6* 28.0*  MCV 88.4 96.8  --   --   --   PLT 198.0 176  --   --   --    Basic Metabolic Panel: Recent Labs  Lab 11/13/19 0344 11/14/19 0621  NA 138 136  K 3.1* 3.5  CL 102 104  CO2 25 25  GLUCOSE 343* 146*  BUN 29* 25*  CREATININE 1.61* 1.54*  CALCIUM 8.6* 8.4*   Liver Function Tests: Recent Labs  Lab 11/13/19 0344  AST 18  ALT 12  ALKPHOS 116  BILITOT 0.4  PROT 6.4*  ALBUMIN 2.7*   Coagulation Profile: No results for input(s): INR, PROTIME in the last 168 hours. HbA1C: Recent Labs    11/13/19 0509  HGBA1C 7.9*   CBG: Recent Labs  Lab 11/13/19 0808 11/13/19 1125 11/13/19 1641 11/13/19 2309 11/14/19 0748  GLUCAP 310* 290* 264* 169* 146*    Recent Results (from the past 240 hour(s))  SARS CORONAVIRUS 2 (TAT 6-24 HRS) Nasopharyngeal Nasopharyngeal Swab     Status: None   Collection Time: 11/13/19  4:13 AM   Specimen: Nasopharyngeal Swab  Result Value Ref Range Status   SARS Coronavirus 2 NEGATIVE NEGATIVE Final    Comment: (NOTE) SARS-CoV-2 target nucleic acids are NOT DETECTED. The SARS-CoV-2 RNA is generally detectable in upper and lower respiratory specimens during the acute phase of infection. Negative results do not preclude SARS-CoV-2 infection, do not rule out co-infections with other pathogens, and should not be used as the sole basis for treatment or other patient management decisions. Negative results must be combined with clinical observations, patient history, and epidemiological information. The expected result is Negative. Fact Sheet for Patients: SugarRoll.be Fact Sheet for Healthcare Providers: https://www.woods-mathews.com/ This test is not yet approved or cleared by the [place] FDA and   has been authorized for detection and/or diagnosis of SARS-CoV-2 by FDA under an Emergency Use Authorization (EUA). This EUA will remain  in effect (meaning this test can be used) for the duration of the COVID-19 declaration under Section 56 4(b)(1) of the Act, 21 U.S.C. section 360bbb-3(b)(1), unless the authorization is terminated or revoked sooner. Performed at [facility], [place] [address], [place], [place] [address]   MRSA PCR Screening     Status: None   Collection Time: 11/13/19  8:06 AM   Specimen: Nasal Mucosa; Nasopharyngeal  Result Value Ref Range Status   MRSA by PCR NEGATIVE NEGATIVE Final    Comment:        The GeneXpert MRSA Assay (FDA approved for NASAL specimens only), is one component of a comprehensive MRSA colonization surveillance program. It is not intended to diagnose MRSA infection nor to guide or monitor treatment for MRSA infections. Performed at [facility], [place]  [address], [place], [place] [address]      Radiology Studies: DG CHEST PORT 1 VIEW  Result Date: 11/13/2019 CLINICAL DATA:  Dyspnea EXAM: PORTABLE CHEST 1 VIEW COMPARISON:  Earlier same day FINDINGS: Emphysema. No new consolidation or edema. No pleural effusion or pneumothorax. Stable cardiomediastinal contours. IMPRESSION: No new findings. Electronically Signed   By: Macy Mis M.D.   On: 11/13/2019 19:32   Marzetta Board, MD, PhD [facility]  Between 7 am - 7 pm I am available, please contact me via Amion or Securechat  Between 7 pm - 7 am I am not available, please contact night coverage MD/APP via Amion

## 2019-11-14 NOTE — Progress Notes (Signed)
Sue [name] 8:40 AM  Subjective: Patient doing well without signs of bleeding and no new complaints and we answered all of her questions and discussed diverticuli again  Objective: Vital signs stable afebrile no acute distress abdomen is soft nontender hemoglobin stable  Assessment: Almost certainly diverticular bleeding seemingly resolved  Plan: If no signs of bleeding this afternoon may advance to soft solids and if stable in a.m. without signs of bleeding probably discharge in my partner Dr. Michail Sermon happy to see back in a few weeks on follow-up and possibly to discuss surgical options  [facility]  office [phone] After 5PM or if no answer call [phone]

## 2019-11-15 LAB — HEMOGLOBIN AND HEMATOCRIT, BLOOD
HCT: 24.7 % — ABNORMAL LOW (ref 36.0–46.0)
HCT: 25.8 % — ABNORMAL LOW (ref 36.0–46.0)
HCT: 27 % — ABNORMAL LOW (ref 36.0–46.0)
HCT: 27.5 % — ABNORMAL LOW (ref 36.0–46.0)
Hemoglobin: 7.9 g/dL — ABNORMAL LOW (ref 12.0–15.0)
Hemoglobin: 8 g/dL — ABNORMAL LOW (ref 12.0–15.0)
Hemoglobin: 8.4 g/dL — ABNORMAL LOW (ref 12.0–15.0)
Hemoglobin: 8.9 g/dL — ABNORMAL LOW (ref 12.0–15.0)

## 2019-11-15 LAB — GLUCOSE, CAPILLARY
Glucose-Capillary: 234 mg/dL — ABNORMAL HIGH (ref 70–99)
Glucose-Capillary: 229 mg/dL — ABNORMAL HIGH (ref 70–99)
Glucose-Capillary: 231 mg/dL — ABNORMAL HIGH (ref 70–99)
Glucose-Capillary: 194 mg/dL — ABNORMAL HIGH (ref 70–99)

## 2019-11-15 LAB — PREPARE RBC (CROSSMATCH)

## 2019-11-15 MED ORDER — METOPROLOL SUCCINATE ER 50 MG PO TB24
50.0000 mg | ORAL_TABLET | Freq: Every day | ORAL | Status: DC
  Administered 2019-11-15 – 2019-12-02 (×17): 50 mg via ORAL
  Filled 2019-11-15 (×10): qty 1
  Filled 2019-11-15: qty 2
  Filled 2019-11-15 (×3): qty 1
  Filled 2019-11-15: qty 2
  Filled 2019-11-15: qty 1
  Filled 2019-11-15: qty 2

## 2019-11-15 MED ORDER — ALUM & MAG HYDROXIDE-SIMETH 200-200-20 MG/5ML PO SUSP: 15.0000 mL | Freq: Four times a day (QID) | ORAL | Status: DC | PRN

## 2019-11-15 MED ORDER — SODIUM CHLORIDE 0.9% IV SOLUTION: Freq: Once | INTRAVENOUS | Status: DC

## 2019-11-15 MED ORDER — ALUM & MAG HYDROXIDE-SIMETH 200-200-20 MG/5ML PO SUSP
15.0000 mL | Freq: Four times a day (QID) | ORAL | Status: DC | PRN
  Administered 2019-11-15 – 2019-12-01 (×10): 15 mL via ORAL
  Filled 2019-11-15 (×10): qty 30

## 2019-11-15 NOTE — Progress Notes (Addendum)
PROGRESS NOTE  Sue [name]:[id number] DOB: 07/17/1951 DOA: 11/13/2019 PCP: Ann Held, DO   LOS: 2 days   Brief Narrative / Interim history: 69 year old female with history of DM 2, HTN, COPD on chronic 3 L nasal cannula at home, morbid obesity, who was admitted to the hospital with bright red blood per rectum for the past day.  She had a similar episode last December for which she was hospitalized, colonoscopy at that time did not reveal any source but did show diverticulosis  Subjective / 24h Interval events: Has had couple of bowel movements that are starting for now, no bright red bleeding anymore, dark maroon  No more hypotension overnight  Assessment & Plan: Principal Problem Lower GI bleed with acute blood loss anemia -Hemoglobin was 5.5 on admission, received a total of 3 units of packed red blood cells, improved appropriately into the 8 range and appears to have been stable since. -She is still equilibrating following the fluids, will transfuse another unit of packed red blood cells for hemoglobin of 8.0 this morning. -Appreciate GI consult, recommended discharge on 3/6 if there is no more bleeding hemoglobin is stable -Her bowel movements are starting to form, and she is probably eliminating some residual blood but clinically without active bleeding  Active Problems Chronic hypoxic respiratory failure -She is at her baseline oxygen with 3 L, satting upper 90s.  CTA of the chest negative for PE. -Respiratory status is stable, she is on baseline oxygen  Essential hypertension -Blood pressure has been in the 120s-140s, heart rate in the 100s, resume home metoprolol  DM2 -Continue Lantus and sliding scale  CBG (last 3)  Recent Labs    11/14/19 1652 11/14/19 2129 11/15/19 0822  GLUCAP 338* 219* 234*    CKD stage IIIb -Baseline creatinine 1.3-1.8, remains at baseline  Morbid obesity -Based on a BMI of 47, will benefit from weight loss  Chronic  diastolic CHF -Appears euvolemic  Scheduled Meds: . sodium chloride   Intravenous Once  . sodium chloride   Intravenous Once  . busPIRone  15 mg Oral Daily  . Chlorhexidine Gluconate Cloth  6 each Topical Daily  . insulin aspart  0-15 Units Subcutaneous TID WC  . insulin aspart  0-5 Units Subcutaneous QHS  . insulin glargine  20 Units Subcutaneous Daily  . levothyroxine  50 mcg Oral Daily  . mouth rinse  15 mL Mouth Rinse BID  . QUEtiapine  100 mg Oral QHS  . zolpidem  5 mg Oral QHS   Continuous Infusions: PRN Meds:.acetaminophen **OR** acetaminophen, cyclobenzaprine, fluticasone, influenza vaccine adjuvanted, LORazepam, LORazepam, morphine injection  DVT prophylaxis: SCDs Code Status: Full code Family Communication: Discussed with patient Patient admitted from: Home Anticipated d/c place: Home Barriers to d/c: clinically monitoring for further GI bleed at GI recommendations, if no events and hemoglobin stable anticipate home discharge 3/6  Consultants:  Gastroenterology  Procedures:  None   Microbiology  None   Antimicrobials: None     Objective: Vitals:   11/14/19 1732 11/14/19 2002 11/14/19 2131 11/15/19 0554  BP: (!) 116/58  (!) 142/84 123/79  Pulse: (!) 102  (!) 102 (!) 110  Resp: 17  18 18   Temp:  98.3 F (36.8 C) 98.1 F (36.7 C) 98 F (36.7 C)  TempSrc:  Oral Oral Oral  SpO2: 100%  100% 99%  Weight:      Height:        Intake/Output Summary (Last 24 hours) at 11/15/2019 1023 Last  data filed at 11/15/2019 0600 Gross per 24 hour  Intake 320 ml  Output 0 ml  Net 320 ml   Filed Weights   11/13/19 0330  Weight: 122.5 kg    Examination:  Constitutional: No distress Eyes: No icterus ENMT: Moist mucous membranes Neck: normal, supple Respiratory: Clear to auscultation bilaterally without wheezing or crackles, normal respiratory effort Cardiovascular: Regular rate and rhythm, no murmurs, no peripheral edema Abdomen: Soft, nontender, nondistended,  positive bowel sounds Musculoskeletal: no clubbing / cyanosis.  Skin: No rashes appreciated Neurologic: No focal deficits, equal strength   Data Reviewed: I have independently reviewed following labs and imaging studies   CBC: Recent Labs  Lab 11/08/19 1113 11/08/19 1113 11/13/19 0344 11/13/19 2050 11/14/19 0621 11/14/19 1201 11/14/19 1817 11/15/19 0049 11/15/19 0602  WBC 6.3  --  9.3  --   --   --   --   --   --   NEUTROABS 3.1  --  5.6  --   --   --   --   --   --   HGB 10.1*   < > 5.5*   < > 8.9* 8.7* 8.6* 8.4* 8.0*  HCT 31.1*   < > 18.3*   < > 28.0* 29.1* 26.4* 27.5* 24.7*  MCV 88.4  --  96.8  --   --   --   --   --   --   PLT 198.0  --  176  --   --   --   --   --   --    < > = values in this interval not displayed.   Basic Metabolic Panel: Recent Labs  Lab 11/13/19 0344 11/14/19 0621  NA 138 136  K 3.1* 3.5  CL 102 104  CO2 25 25  GLUCOSE 343* 146*  BUN 29* 25*  CREATININE 1.61* 1.54*  CALCIUM 8.6* 8.4*   Liver Function Tests: Recent Labs  Lab 11/13/19 0344  AST 18  ALT 12  ALKPHOS 116  BILITOT 0.4  PROT 6.4*  ALBUMIN 2.7*   Coagulation Profile: No results for input(s): INR, PROTIME in the last 168 hours. HbA1C: Recent Labs    11/13/19 0509  HGBA1C 7.9*   CBG: Recent Labs  Lab 11/14/19 0748 11/14/19 1135 11/14/19 1652 11/14/19 2129 11/15/19 0822  GLUCAP 146* 236* 338* 219* 234*    Recent Results (from the past 240 hour(s))  SARS CORONAVIRUS 2 (TAT 6-24 HRS) Nasopharyngeal Nasopharyngeal Swab     Status: None   Collection Time: 11/13/19  4:13 AM   Specimen: Nasopharyngeal Swab  Result Value Ref Range Status   SARS Coronavirus 2 NEGATIVE NEGATIVE Final    Comment: (NOTE) SARS-CoV-2 target nucleic acids are NOT DETECTED. The SARS-CoV-2 RNA is generally detectable in upper and lower respiratory specimens during the acute phase of infection. Negative results do not preclude SARS-CoV-2 infection, do not rule out co-infections with  other pathogens, and should not be used as the sole basis for treatment or other patient management decisions. Negative results must be combined with clinical observations, patient history, and epidemiological information. The expected result is Negative. Fact Sheet for Patients: SugarRoll.be Fact Sheet for Healthcare Providers: https://www.woods-mathews.com/ This test is not yet approved or cleared by the [place] FDA and  has been authorized for detection and/or diagnosis of SARS-CoV-2 by FDA under an Emergency Use Authorization (EUA). This EUA will remain  in effect (meaning this test can be used) for the duration of the COVID-19 declaration  under Section 56 4(b)(1) of the Act, 21 U.S.C. section 360bbb-3(b)(1), unless the authorization is terminated or revoked sooner. Performed at [facility], [place] [address], [place], [place] [address]   MRSA PCR Screening     Status: None   Collection Time: 11/13/19  8:06 AM   Specimen: Nasal Mucosa; Nasopharyngeal  Result Value Ref Range Status   MRSA by PCR NEGATIVE NEGATIVE Final    Comment:        The GeneXpert MRSA Assay (FDA approved for NASAL specimens only), is one component of a comprehensive MRSA colonization surveillance program. It is not intended to diagnose MRSA infection nor to guide or monitor treatment for MRSA infections. Performed at [facility], [place] [address], [place], [place] [address]      Radiology Studies: No results found. Marzetta Board, MD, PhD [facility]  Between 7 am - 7 pm I am available, please contact me via Amion or Securechat  Between 7 pm - 7 am I am not available, please contact night coverage MD/APP via Amion

## 2019-11-15 NOTE — Progress Notes (Signed)
RN agrees with previous RN's assessment. 

## 2019-11-15 NOTE — Care Management Important Message (Signed)
Important Message  Patient Details IM Letter given to Evette Cristal SW Case Manager to present to the Patient Name: Sue [name] MRN: [id number] Date of Birth: September 08, 1951   Medicare Important Message Given:  Yes     Kerin Salen 11/15/2019, 12:15 PM

## 2019-11-15 NOTE — Care Management Important Message (Signed)
Important Message  Patient Details IM Letter given to Dessa Phi RN Case Manager to present to the Patient Name: Sue [name] MRN: [id number] Date of Birth: 09/01/1951   Medicare Important Message Given:  Yes     Kerin Salen 11/15/2019, 12:08 PM

## 2019-11-15 NOTE — Progress Notes (Signed)
Sue [name] 8:31 AM  Subjective: Patient doing well tolerating diet having a little black stool her main concern is not being on her potassium and her muscular skeletal cramps but no new complaints and she likes her new room  Objective: Vital signs stable afebrile no acute distress abdomen is soft nontender hemoglobin slight drop  Assessment: Almost certainly diverticular bleeding  Plan: Probably transfuse 1 more unit and watch 1 more day and hopefully if no signs of bleeding can go home tomorrow and please call my partner Dr. Cristina Gong if any question or problems this weekend and follow-up with my partner Dr. Michail Sermon in 1 to 2 weeks okay to recheck hemoglobin symptoms and possibly discuss surgical options on her diverticuli  [facility]  office [phone] After 5PM or if no answer call [phone]

## 2019-11-15 NOTE — Progress Notes (Signed)
Inpatient Diabetes Program Recommendations  AACE/ADA: New Consensus Statement on Inpatient Glycemic Control (2015)  Target Ranges:  Prepandial:   less than 140 mg/dL      Peak postprandial:   less than 180 mg/dL (1-2 hours)      Critically ill patients:  140 - 180 mg/dL   Lab Results  Component Value Date   GLUCAP 229 (H) 11/15/2019   HGBA1C 7.9 (H) 11/13/2019    Review of Glycemic Control  Diabetes history: DM2 Outpatient Diabetes medications: Lantus 30 units QD, metformin 1000 mg bid, glipizide 5 mg bid Current orders for Inpatient glycemic control: Lantus 20 units QD, Novolog 0-15 units tidwc and 0-5 units QHS  HgbA1C - 7.9% - likely not accurate with low H/H.  Inpatient Diabetes Program Recommendations:     Increase Lantus to 25 units QD Add CHO mod med to soft diet.  Continue to follow.  Thank you. Lorenda Peck, RD, LDN, CDE Inpatient Diabetes Coordinator [phone]

## 2019-11-16 LAB — BPAM RBC
Blood Product Expiration Date: 202104032359
Blood Product Expiration Date: 202104032359
Blood Product Expiration Date: 202104032359
Blood Product Expiration Date: 202104042359
Blood Product Expiration Date: 202104072359
ISSUE DATE / TIME: 202103030445
ISSUE DATE / TIME: 202103030445
ISSUE DATE / TIME: 202103031531
ISSUE DATE / TIME: 202103051115
ISSUE DATE / TIME: 202103061637
Unit Type and Rh: 5100
Unit Type and Rh: 5100
Unit Type and Rh: 5100
Unit Type and Rh: 5100
Unit Type and Rh: 5100

## 2019-11-16 LAB — PREPARE RBC (CROSSMATCH)

## 2019-11-16 LAB — CBC
HCT: 24.7 % — ABNORMAL LOW (ref 36.0–46.0)
Hemoglobin: 7.7 g/dL — ABNORMAL LOW (ref 12.0–15.0)
MCH: 29.8 pg (ref 26.0–34.0)
MCHC: 31.2 g/dL (ref 30.0–36.0)
MCV: 95.7 fL (ref 80.0–100.0)
Platelets: 146 K/uL — ABNORMAL LOW (ref 150–400)
RBC: 2.58 MIL/uL — ABNORMAL LOW (ref 3.87–5.11)
RDW: 18.8 % — ABNORMAL HIGH (ref 11.5–15.5)
WBC: 9.4 K/uL (ref 4.0–10.5)
nRBC: 0.2 % (ref 0.0–0.2)

## 2019-11-16 LAB — TYPE AND SCREEN
ABO/RH(D): O POS
Antibody Screen: NEGATIVE
Unit division: 0
Unit division: 0
Unit division: 0
Unit division: 0
Unit division: 0

## 2019-11-16 LAB — GLUCOSE, CAPILLARY
Glucose-Capillary: 151 mg/dL — ABNORMAL HIGH (ref 70–99)
Glucose-Capillary: 236 mg/dL — ABNORMAL HIGH (ref 70–99)
Glucose-Capillary: 165 mg/dL — ABNORMAL HIGH (ref 70–99)
Glucose-Capillary: 163 mg/dL — ABNORMAL HIGH (ref 70–99)

## 2019-11-16 MED ORDER — SODIUM CHLORIDE 0.9% FLUSH: 10.0000 mL | INTRAVENOUS | Status: DC | PRN

## 2019-11-16 MED ORDER — SODIUM CHLORIDE 0.9% IV SOLUTION: Freq: Once | INTRAVENOUS | Status: DC

## 2019-11-16 MED ORDER — SODIUM CHLORIDE 0.9% FLUSH
10.0000 mL | Freq: Two times a day (BID) | INTRAVENOUS | Status: DC
  Administered 2019-11-16 – 2019-11-26 (×10): 10 mL

## 2019-11-16 NOTE — Progress Notes (Addendum)
Pt daughter in room beginning to help get pt washed up. Pt requesting delay in transfusion until done getting bath. Informed daughter to call when they are finished so we can begin transfusion. MD made aware of delay.

## 2019-11-16 NOTE — Progress Notes (Signed)
Asked to see patient because of recurrent bleeding and decline in hemoglobin in association with apparent diverticular bleeding, status post colonoscopies in both November 2020 and December 2020, both showing "a few sigmoid diverticula" and no alternative source of bleeding.  As noted, the patient has now required a total of approximately 12 units of packed cells since November.  She has complicating issues of diabetes, morbid obesity, and COPD with chronic home oxygen requirement.  Exam: Pleasant, cognizant, morbidly obese African-American female sitting up in bed on nasal cannula oxygen, no distress, no evident pain, no respiratory distress.  Abdomen is soft and without tenderness.  Impression: High probability that this is recurrent diverticular bleeding.  No alternative source evident.  Recommendations:   In view of the patient's relatively young age and stable (albeit medically complicated) medical condition, I think we need to take a "long-term approach" toward her management.    Considering the frequency and closeness of her bleeding episodes (3 episodes in 5 months), and her significant transfusion requirement, I would favor definitive therapy rather than observation.    I would therefore favor a surgical consult.    If they feel that the patient is at extremely high risk for a sigmoid colectomy given her medical comorbidities, I would then favor an interventional radiology consult to see if an empiric embolization of the sigmoid arterial branches is a feasible option.  At this time, I do not think there is a likely further role for GI, so we will plan to sign off, but please call us if we can be of further assistance or provide further input in this patient's care.  Cleotis Nipper, M.D. Pager [phone] If no answer or after 5 PM call [phone]

## 2019-11-16 NOTE — H&P (Signed)
CC: GI Bleed, intermittent  Requesting provider: Dr. Cruzita Lederer  HPI: Sue [name] is an 69 y.o. female with hx of HTN, hypothyroidism, GERD, COPD on 3L home oxygen, poorly controlled diabetes (A1c 8), morbid obesity, malnutrition/hypoalbuminemia (admitting alb 2.7) whom has history of presumed lower GI bleed. She first developed this issue 07/2019 while on warfarin for DVT following leg trauma back in the summer. This was stopped. Colonoscopy by Dr. Michail Sermon showed blood throughout her colon including the cecum.  The terminal ileum was noted to the normal.  She has small internal hemorrhoids.  She had diverticulosis.  She was transfused and improved.  EGD in November was reportedly negative.  She was discharged but readmitted the following month with again presumed lower GI bleed.  Prepped colonoscopy that time did not elicit a source and there is no blood noted.  She was readmitted 11/13/2019 with melanotic stool and bright red blood per rectum.  She was resuscitated, admitted, transfused.  She received 3 units of blood.  Her hemoglobin appears to have stabilized but she still notes some occasional melanotic stool.  He has been hemodynamically stable.  Since November, she has received 12 units of packed cells.  Currently, she denies any issue with abdominal pain throughout this whole event.  To date, her bleed has not been localized.  Gastroenterology had been following this admission but have since signed off and recommended surgery.  Past Medical History:  Diagnosis Date  . Allergic rhinitis   . Depression   . Emphysema of lung ([place])    3L home O2  . GERD (gastroesophageal reflux disease)   . Hypertension   . Hypothyroidism   . Obesity, morbid, BMI 50 or higher ([place])   . Stroke [facility]) 2016   TIA   . Urine incontinence     Past Surgical History:  Procedure Laterality Date  . ABDOMINAL HYSTERECTOMY    . CESAREAN SECTION    . COLONOSCOPY N/A 08/19/2019   Procedure: COLONOSCOPY;  Surgeon:  Clarene Essex, MD;  Location: [facility];  Service: Endoscopy;  Laterality: N/A;  . COLONOSCOPY WITH PROPOFOL N/A 08/05/2019   Procedure: COLONOSCOPY WITH PROPOFOL;  Surgeon: Wilford Corner, MD;  Location: [facility];  Service: Gastroenterology;  Laterality: N/A;    Family History  Problem Relation Age of Onset  . Heart disease Father        MVP and Pics Valve  . Hypertension Father   . Depression Father        Institutionalized x's 2 years  . Bipolar disorder Father   . Hypertension Sister   . Diabetes Sister   . Hyperlipidemia Sister   . Heart disease Sister 95       MI  . Heart disease Brother   . Hypertension Brother   . Heart disease Paternal Grandmother   . Heart disease Paternal Aunt   . Heart disease Paternal Uncle   . Schizophrenia Paternal Aunt   . Asthma Son   . Asthma Son     Social:  reports that she quit smoking about 8 years ago. Her smoking use included cigarettes. She started smoking about 47 years ago. She has a 40.00 pack-year smoking history. She has never used smokeless tobacco. She reports previous alcohol use. She reports that she does not use drugs.  Allergies:  Allergies  Allergen Reactions  . Hydrocodone Other (See Comments)    Constipation, hallucinations  . Norvasc [Amlodipine Besylate]     Marked swelling    Medications: I have reviewed  the patient's current medications.  Results for orders placed or performed during the hospital encounter of 11/13/19 (from the past 48 hour(s))  Hemoglobin and hematocrit, blood     Status: Abnormal   Collection Time: 11/14/19  6:17 PM  Result Value Ref Range   Hemoglobin 8.6 (L) 12.0 - 15.0 g/dL   HCT 26.4 (L) 36.0 - 46.0 %    Comment: Performed at [facility], [place] [address], [place], [place] [address]  Glucose, capillary     Status: Abnormal   Collection Time: 11/14/19  9:29 PM  Result Value Ref Range   Glucose-Capillary 219 (H) 70 - 99 mg/dL    Comment: Glucose reference  range applies only to samples taken after fasting for at least 8 hours.  Hemoglobin and hematocrit, blood     Status: Abnormal   Collection Time: 11/15/19 12:49 AM  Result Value Ref Range   Hemoglobin 8.4 (L) 12.0 - 15.0 g/dL   HCT 27.5 (L) 36.0 - 46.0 %    Comment: Performed at [facility], [place] [address], [place], [place] [address]  Hemoglobin and hematocrit, blood     Status: Abnormal   Collection Time: 11/15/19  6:02 AM  Result Value Ref Range   Hemoglobin 8.0 (L) 12.0 - 15.0 g/dL   HCT 24.7 (L) 36.0 - 46.0 %    Comment: Performed at [facility], [place] [address] [place], [place] [address]  Glucose, capillary     Status: Abnormal   Collection Time: 11/15/19  8:22 AM  Result Value Ref Range   Glucose-Capillary 234 (H) 70 - 99 mg/dL    Comment: Glucose reference range applies only to samples taken after fasting for at least 8 hours.  Prepare RBC     Status: None   Collection Time: 11/15/19  9:00 AM  Result Value Ref Range   Order Confirmation      ORDER PROCESSED BY BLOOD BANK Performed at [facility], [place] [address], [place], [place] [address]   Glucose, capillary     Status: Abnormal   Collection Time: 11/15/19 11:56 AM  Result Value Ref Range   Glucose-Capillary 229 (H) 70 - 99 mg/dL    Comment: Glucose reference range applies only to samples taken after fasting for at least 8 hours.  Glucose, capillary     Status: Abnormal   Collection Time: 11/15/19  4:45 PM  Result Value Ref Range   Glucose-Capillary 231 (H) 70 - 99 mg/dL    Comment: Glucose reference range applies only to samples taken after fasting for at least 8 hours.  Hemoglobin and hematocrit, blood     Status: Abnormal   Collection Time: 11/15/19  5:23 PM  Result Value Ref Range   Hemoglobin 8.9 (L) 12.0 - 15.0 g/dL   HCT 27.0 (L) 36.0 - 46.0 %    Comment: Performed at [facility], [place] [address], [place], [place] [address]  Glucose,  capillary     Status: Abnormal   Collection Time: 11/15/19  9:42 PM  Result Value Ref Range   Glucose-Capillary 194 (H) 70 - 99 mg/dL    Comment: Glucose reference range applies only to samples taken after fasting for at least 8 hours.  Hemoglobin and hematocrit, blood     Status: Abnormal   Collection Time: 11/15/19 11:42 PM  Result Value Ref Range   Hemoglobin 7.9 (L) 12.0 - 15.0 g/dL   HCT 25.8 (L) 36.0 - 46.0 %  Comment: Performed at [facility], [place] [address] [place], [place] [address]  CBC     Status: Abnormal   Collection Time: 11/16/19  8:14 AM  Result Value Ref Range   WBC 9.4 4.0 - 10.5 K/uL   RBC 2.58 (L) 3.87 - 5.11 MIL/uL   Hemoglobin 7.7 (L) 12.0 - 15.0 g/dL   HCT 24.7 (L) 36.0 - 46.0 %   MCV 95.7 80.0 - 100.0 fL   MCH 29.8 26.0 - 34.0 pg   MCHC 31.2 30.0 - 36.0 g/dL   RDW 18.8 (H) 11.5 - 15.5 %   Platelets 146 (L) 150 - 400 K/uL   nRBC 0.2 0.0 - 0.2 %    Comment: Performed at [facility], [place] [address], [place], [place] [address]  Glucose, capillary     Status: Abnormal   Collection Time: 11/16/19  8:25 AM  Result Value Ref Range   Glucose-Capillary 151 (H) 70 - 99 mg/dL    Comment: Glucose reference range applies only to samples taken after fasting for at least 8 hours.  Prepare RBC     Status: None   Collection Time: 11/16/19 10:47 AM  Result Value Ref Range   Order Confirmation      ORDER PROCESSED BY BLOOD BANK Performed at [facility], [place] [address], [place], [place] [address]   Glucose, capillary     Status: Abnormal   Collection Time: 11/16/19 11:49 AM  Result Value Ref Range   Glucose-Capillary 236 (H) 70 - 99 mg/dL    Comment: Glucose reference range applies only to samples taken after fasting for at least 8 hours.  Type and screen [place]     Status: None (Preliminary result)   Collection Time: 11/16/19  2:00 PM  Result Value Ref Range   ABO/RH(D) O POS     Antibody Screen NEG    Sample Expiration 11/19/2019,2359    Unit Number [id number]    Blood Component Type RBC LR PHER2    Unit division 00    Status of Unit ISSUED    Transfusion Status OK TO TRANSFUSE    Crossmatch Result      Compatible Performed at [facility], [place] [address] [place],  [address]   Glucose, capillary     Status: Abnormal   Collection Time: 11/16/19  4:49 PM  Result Value Ref Range   Glucose-Capillary 165 (H) 70 - 99 mg/dL    Comment: Glucose reference range applies only to samples taken after fasting for at least 8 hours.    No results found.  ROS - all of the below systems have been reviewed with the patient and positives are indicated with bold text General: chills, fever or night sweats Eyes: blurry vision or double vision ENT: epistaxis or sore throat Allergy/Immunology: itchy/watery eyes or nasal congestion Hematologic/Lymphatic: bleeding problems, blood clots or swollen lymph nodes Endocrine: temperature intolerance or unexpected weight changes Breast: new or changing breast lumps or nipple discharge Resp: cough, shortness of breath, or wheezing CV: chest pain or dyspnea on exertion GI: as per HPI GU: dysuria, trouble voiding, or hematuria MSK: joint pain or joint stiffness Neuro: TIA or stroke symptoms Derm: pruritus and skin lesion changes Psych: anxiety and depression  PE Blood pressure 120/71, pulse 93, temperature 99.3 F (37.4 C), temperature source Oral, resp. rate 14, height 5\' 3"  (1.6 m), weight 122.5 kg, SpO2 99 %. Constitutional: NAD; conversant; no deformities; wearing mask and  nasal canula Eyes: Moist conjunctiva; no lid lag; anicteric; pupils equal and round Neck: Trachea midline; no thyromegaly Lungs: Normal respiratory effort; no tactile fremitus CV: RRR; no palpable thrills; no pitting edema GI: Abdomen obese soft, nontender, nondistended; no palpable hepatosplenomegaly MSK: Normal range of motion  of extremities; no clubbing/cyanosis Psychiatric: Appropriate affect; alert and oriented x3 Lymphatic: No palpable cervical or axillary lymphadenopathy  Results for orders placed or performed during the hospital encounter of 11/13/19 (from the past 48 hour(s))  Hemoglobin and hematocrit, blood     Status: Abnormal   Collection Time: 11/14/19  6:17 PM  Result Value Ref Range   Hemoglobin 8.6 (L) 12.0 - 15.0 g/dL   HCT 26.4 (L) 36.0 - 46.0 %    Comment: Performed at [facility], [place] [address], [place], [place] [address]  Glucose, capillary     Status: Abnormal   Collection Time: 11/14/19  9:29 PM  Result Value Ref Range   Glucose-Capillary 219 (H) 70 - 99 mg/dL    Comment: Glucose reference range applies only to samples taken after fasting for at least 8 hours.  Hemoglobin and hematocrit, blood     Status: Abnormal   Collection Time: 11/15/19 12:49 AM  Result Value Ref Range   Hemoglobin 8.4 (L) 12.0 - 15.0 g/dL   HCT 27.5 (L) 36.0 - 46.0 %    Comment: Performed at [facility], [place] [address], [place], [place] [address]  Hemoglobin and hematocrit, blood     Status: Abnormal   Collection Time: 11/15/19  6:02 AM  Result Value Ref Range   Hemoglobin 8.0 (L) 12.0 - 15.0 g/dL   HCT 24.7 (L) 36.0 - 46.0 %    Comment: Performed at [facility], [place] [address] [place], [place] [address]  Glucose, capillary     Status: Abnormal   Collection Time: 11/15/19  8:22 AM  Result Value Ref Range   Glucose-Capillary 234 (H) 70 - 99 mg/dL    Comment: Glucose reference range applies only to samples taken after fasting for at least 8 hours.  Prepare RBC     Status: None   Collection Time: 11/15/19  9:00 AM  Result Value Ref Range   Order Confirmation      ORDER PROCESSED BY BLOOD BANK Performed at [facility], [place] [address] [place], [place] [address]   Glucose, capillary     Status: Abnormal   Collection Time:  11/15/19 11:56 AM  Result Value Ref Range   Glucose-Capillary 229 (H) 70 - 99 mg/dL    Comment: Glucose reference range applies only to samples taken after fasting for at least 8 hours.  Glucose, capillary     Status: Abnormal   Collection Time: 11/15/19  4:45 PM  Result Value Ref Range   Glucose-Capillary 231 (H) 70 - 99 mg/dL    Comment: Glucose reference range applies only to samples taken after fasting for at least 8 hours.  Hemoglobin and hematocrit, blood     Status: Abnormal   Collection Time: 11/15/19  5:23 PM  Result Value Ref Range   Hemoglobin 8.9 (L) 12.0 - 15.0 g/dL   HCT 27.0 (L) 36.0 - 46.0 %    Comment: Performed at [facility], [place] [address] [place],  [address]  Glucose, capillary     Status: Abnormal   Collection Time: 11/15/19  9:42 PM  Result Value Ref Range   Glucose-Capillary 194 (H) 70 - 99 mg/dL  Comment: Glucose reference range applies only to samples taken after fasting for at least 8 hours.  Hemoglobin and hematocrit, blood     Status: Abnormal   Collection Time: 11/15/19 11:42 PM  Result Value Ref Range   Hemoglobin 7.9 (L) 12.0 - 15.0 g/dL   HCT 25.8 (L) 36.0 - 46.0 %    Comment: Performed at [facility], [place] [address] [place], [place] [address]  CBC     Status: Abnormal   Collection Time: 11/16/19  8:14 AM  Result Value Ref Range   WBC 9.4 4.0 - 10.5 K/uL   RBC 2.58 (L) 3.87 - 5.11 MIL/uL   Hemoglobin 7.7 (L) 12.0 - 15.0 g/dL   HCT 24.7 (L) 36.0 - 46.0 %   MCV 95.7 80.0 - 100.0 fL   MCH 29.8 26.0 - 34.0 pg   MCHC 31.2 30.0 - 36.0 g/dL   RDW 18.8 (H) 11.5 - 15.5 %   Platelets 146 (L) 150 - 400 K/uL   nRBC 0.2 0.0 - 0.2 %    Comment: Performed at [facility], [place] [address] [place], [place] [address]  Glucose, capillary     Status: Abnormal   Collection Time: 11/16/19  8:25 AM  Result Value Ref Range   Glucose-Capillary 151 (H) 70 - 99 mg/dL    Comment: Glucose reference  range applies only to samples taken after fasting for at least 8 hours.  Prepare RBC     Status: None   Collection Time: 11/16/19 10:47 AM  Result Value Ref Range   Order Confirmation      ORDER PROCESSED BY BLOOD BANK Performed at [facility], [place] [address] [place], [place] [address]   Glucose, capillary     Status: Abnormal   Collection Time: 11/16/19 11:49 AM  Result Value Ref Range   Glucose-Capillary 236 (H) 70 - 99 mg/dL    Comment: Glucose reference range applies only to samples taken after fasting for at least 8 hours.  Type and screen [place]     Status: None (Preliminary result)   Collection Time: 11/16/19  2:00 PM  Result Value Ref Range   ABO/RH(D) O POS    Antibody Screen NEG    Sample Expiration 11/19/2019,2359    Unit Number [id number]    Blood Component Type RBC LR PHER2    Unit division 00    Status of Unit ISSUED    Transfusion Status OK TO TRANSFUSE    Crossmatch Result      Compatible Performed at [facility], [place] [address] [place], [place] [address]   Glucose, capillary     Status: Abnormal   Collection Time: 11/16/19  4:49 PM  Result Value Ref Range   Glucose-Capillary 165 (H) 70 - 99 mg/dL    Comment: Glucose reference range applies only to samples taken after fasting for at least 8 hours.    No results found.   A/P: Sue [name] is a very pleasant 69 y.o. female with HTN, hypothyroidism, GERD, COPD on 3L home oxygen, poorly controlled diabetes (A1c 8), morbid obesity, malnutrition/hypoalbuminemia (admitting alb 2.7) - admitted with recurrent presumed lower GI bleeds which have yet to be localized  -We spent time today discussing her condition, work-up, and nonlocalized source of GI bleeding.  This is presumed to be from the lower GI tract given that her terminal ileum was normal on colonoscopy without apparent blood. Her colonoscopy 07/2019 showed blood throughout  entire colon.  We  discussed that there are a variety of causes for lower GI bleed which could even be something like a small AVM which can also be difficult to find as they can even be behind colonic folds.  Statistically, diverticulosis is most likely but the differential is wide.  For these reasons, we discussed in the setting of nonlocalized GI bleeding, surgery likely entailing a total abdominal colectomy with end ileostomy.  Having the ileostomy reach will likely be challenging and lead to significant pouching issues. There will be issues with dehydration, output, pouching, etc.  We discussed that this is a major surgical procedure and with her other comorbidities, carries significant risks to her health and survival. She is not interested in pursuing this at this time nor would I necessarily recommend this. -In the setting of non-localized lower GI bleed, I would advocate for repeat attempts at endoscopy in an effort to localize and potentially even control the source of the bleeding. This will be the procedure that likely carries the lowest risk and potentially highest therapeutic benefit to her. We could also be available for anoscopy at that time to ensure there is nothing in the anal canal as the cause. -Interventional radiology for "prophylactic" embolization of the sigmoid colon without a localized source but certainly carry a much higher risk of colonic ischemia. This would unlikely be able t be a superselective embolization as her CT has not localized the source. There is also the significant risk that this is not arising from diverticulosis but rather a different location in her colon. -We can discuss further with GI as well -We will follow with you  Sharon Mt. Dema Severin, M.D. [facility] Use [email or web address] to contact on call provider

## 2019-11-16 NOTE — Progress Notes (Signed)
PROGRESS NOTE  Sue [name] QMG:[id number] DOB: 1951/03/10 DOA: 11/13/2019 PCP: Ann Held, DO   LOS: 3 days   Brief Narrative / Interim history: 69 year old female with history of DM 2, HTN, COPD on chronic 3 L nasal cannula at home, morbid obesity, who was admitted to the hospital with bright red blood per rectum for the past day.  She had a similar episode last December for which she was hospitalized, colonoscopy at that time did not reveal any source but did show diverticulosis  Subjective / 24h Interval events: Had another bowel movement with bright red blood earlier this morning around 4 AM.  No nausea, no vomiting, no chest pain, no other issues  Assessment & Plan: Principal Problem Lower GI bleed with acute blood loss anemia -Hemoglobin is 5.5 on admission, received 3 units of packed red blood cells, improved appropriately but then drifted downwards and received 1/4 unit on 3/5.  Again improved appropriately however overnight and this morning her hemoglobin is back down to 7.7.  She has had fresh bleeding overnight.  Transfuse another unit of packed red blood cells, now totaling 5 units -She had 2 hospitalizations in November 2020 as well as December 2020 requiring a total of 8 unit packed red blood cells.  She had a colonoscopy during those admissions which showed sigmoid diverticulosis but no active source of bleeding. -Have discussed with Dr. Cristina Gong, she has had several hospitalization within the span of 4 months requiring multiple blood transfusions, and still bleeding.  Her diverticulosis appears to be located to the sigmoid area.  Will consult general surgery  Active Problems Chronic hypoxic respiratory failure -She is at her baseline oxygen with 3 L, satting upper 90s.  CTA of the chest negative for PE. -Respiratory status is stable, she is on baseline oxygen  Essential hypertension -Blood pressure has been in the 120s-140s, heart rate in the 100s, resume home  metoprolol  DM2 -Continue Lantus and sliding scale, fasting this morning 151  CBG (last 3)  Recent Labs    11/15/19 1645 11/15/19 2142 11/16/19 0825  GLUCAP 231* 194* 151*    CKD stage IIIb -Baseline creatinine 1.3-1.8, she remains at baseline around 1.5  Morbid obesity -Based on a BMI of 47, will benefit from weight loss  Chronic diastolic CHF -Appears euvolemic  Scheduled Meds: . sodium chloride   Intravenous Once  . sodium chloride   Intravenous Once  . sodium chloride   Intravenous Once  . busPIRone  15 mg Oral Daily  . Chlorhexidine Gluconate Cloth  6 each Topical Daily  . insulin aspart  0-15 Units Subcutaneous TID WC  . insulin aspart  0-5 Units Subcutaneous QHS  . insulin glargine  20 Units Subcutaneous Daily  . levothyroxine  50 mcg Oral Daily  . mouth rinse  15 mL Mouth Rinse BID  . metoprolol succinate  50 mg Oral Daily  . QUEtiapine  100 mg Oral QHS  . zolpidem  5 mg Oral QHS   Continuous Infusions: PRN Meds:.acetaminophen **OR** acetaminophen, alum & mag hydroxide-simeth, cyclobenzaprine, fluticasone, influenza vaccine adjuvanted, LORazepam, LORazepam, morphine injection  DVT prophylaxis: SCDs Code Status: Full code Family Communication: Discussed with patient Patient admitted from: Home Anticipated d/c place: Home Barriers to d/c: Recurrent diverticular bleed, general surgery consultation  Consultants:  Gastroenterology  Procedures:  None   Microbiology  None   Antimicrobials: None     Objective: Vitals:   11/15/19 1135 11/15/19 1345 11/15/19 2141 11/16/19 0522  BP: 109/60 126/63  140/84 (!) 113/56  Pulse: (!) 101 100 (!) 101 94  Resp: 18 18 18 18   Temp: 98.1 F (36.7 C) 98.2 F (36.8 C) 97.7 F (36.5 C) 97.9 F (36.6 C)  TempSrc: Oral Oral Oral Oral  SpO2: 99% 100% 100% 100%  Weight:      Height:        Intake/Output Summary (Last 24 hours) at 11/16/2019 1013 Last data filed at 11/15/2019 1859 Gross per 24 hour  Intake  300.83 ml  Output 800 ml  Net -499.17 ml   Filed Weights   11/13/19 0330  Weight: 122.5 kg    Examination:  Constitutional: NAD Eyes: No scleral icterus ENMT: MMM Neck: normal, supple Respiratory: Clear bilaterally without wheezing, crackles, normal respiratory effort Cardiovascular: Regular rate and rhythm, no murmurs, no edema Abdomen: Soft, NT, ND, positive bowel sounds Musculoskeletal: no clubbing / cyanosis.  Skin: No rashes seen Neurologic: Nonfocal, equal strength   Data Reviewed: I have independently reviewed following labs and imaging studies   CBC: Recent Labs  Lab 11/13/19 0344 11/13/19 2050 11/15/19 0049 11/15/19 0602 11/15/19 1723 11/15/19 2342 11/16/19 0814  WBC 9.3  --   --   --   --   --  9.4  NEUTROABS 5.6  --   --   --   --   --   --   HGB 5.5*   < > 8.4* 8.0* 8.9* 7.9* 7.7*  HCT 18.3*   < > 27.5* 24.7* 27.0* 25.8* 24.7*  MCV 96.8  --   --   --   --   --  95.7  PLT 176  --   --   --   --   --  146*   < > = values in this interval not displayed.   Basic Metabolic Panel: Recent Labs  Lab 11/13/19 0344 11/14/19 0621  NA 138 136  K 3.1* 3.5  CL 102 104  CO2 25 25  GLUCOSE 343* 146*  BUN 29* 25*  CREATININE 1.61* 1.54*  CALCIUM 8.6* 8.4*   Liver Function Tests: Recent Labs  Lab 11/13/19 0344  AST 18  ALT 12  ALKPHOS 116  BILITOT 0.4  PROT 6.4*  ALBUMIN 2.7*   Coagulation Profile: No results for input(s): INR, PROTIME in the last 168 hours. HbA1C: No results for input(s): HGBA1C in the last 72 hours. CBG: Recent Labs  Lab 11/15/19 0822 11/15/19 1156 11/15/19 1645 11/15/19 2142 11/16/19 0825  GLUCAP 234* 229* 231* 194* 151*    Recent Results (from the past 240 hour(s))  SARS CORONAVIRUS 2 (TAT 6-24 HRS) Nasopharyngeal Nasopharyngeal Swab     Status: None   Collection Time: 11/13/19  4:13 AM   Specimen: Nasopharyngeal Swab  Result Value Ref Range Status   SARS Coronavirus 2 NEGATIVE NEGATIVE Final    Comment:  (NOTE) SARS-CoV-2 target nucleic acids are NOT DETECTED. The SARS-CoV-2 RNA is generally detectable in upper and lower respiratory specimens during the acute phase of infection. Negative results do not preclude SARS-CoV-2 infection, do not rule out co-infections with other pathogens, and should not be used as the sole basis for treatment or other patient management decisions. Negative results must be combined with clinical observations, patient history, and epidemiological information. The expected result is Negative. Fact Sheet for Patients: SugarRoll.be Fact Sheet for Healthcare Providers: https://www.woods-mathews.com/ This test is not yet approved or cleared by the [place] FDA and  has been authorized for detection and/or diagnosis of SARS-CoV-2 by FDA under  an Emergency Use Authorization (EUA). This EUA will remain  in effect (meaning this test can be used) for the duration of the COVID-19 declaration under Section 56 4(b)(1) of the Act, 21 U.S.C. section 360bbb-3(b)(1), unless the authorization is terminated or revoked sooner. Performed at [facility], [place] [address] [place], [place] [address]   MRSA PCR Screening     Status: None   Collection Time: 11/13/19  8:06 AM   Specimen: Nasal Mucosa; Nasopharyngeal  Result Value Ref Range Status   MRSA by PCR NEGATIVE NEGATIVE Final    Comment:        The GeneXpert MRSA Assay (FDA approved for NASAL specimens only), is one component of a comprehensive MRSA colonization surveillance program. It is not intended to diagnose MRSA infection nor to guide or monitor treatment for MRSA infections. Performed at [facility], [place] [address] [place], [place] [address]      Radiology Studies: No results found. Marzetta Board, MD, PhD [facility]  Between 7 am - 7 pm I am available, please contact me via Amion or Securechat  Between 7 pm - 7 am I am  not available, please contact night coverage MD/APP via Amion

## 2019-11-17 LAB — HEMOGLOBIN AND HEMATOCRIT, BLOOD
HCT: 23.7 % — ABNORMAL LOW (ref 36.0–46.0)
HCT: 27.5 % — ABNORMAL LOW (ref 36.0–46.0)
Hemoglobin: 7.4 g/dL — ABNORMAL LOW (ref 12.0–15.0)
Hemoglobin: 8.6 g/dL — ABNORMAL LOW (ref 12.0–15.0)

## 2019-11-17 LAB — GLUCOSE, CAPILLARY
Glucose-Capillary: 167 mg/dL — ABNORMAL HIGH (ref 70–99)
Glucose-Capillary: 189 mg/dL — ABNORMAL HIGH (ref 70–99)
Glucose-Capillary: 173 mg/dL — ABNORMAL HIGH (ref 70–99)
Glucose-Capillary: 166 mg/dL — ABNORMAL HIGH (ref 70–99)

## 2019-11-17 LAB — PREPARE RBC (CROSSMATCH)

## 2019-11-17 MED ORDER — POLYETHYLENE GLYCOL 3350 17 G PO PACK: 17.0000 g | PACK | Freq: Two times a day (BID) | ORAL | Status: DC

## 2019-11-17 MED ORDER — SODIUM CHLORIDE 0.9% IV SOLUTION: Freq: Once | INTRAVENOUS | Status: DC

## 2019-11-17 NOTE — Progress Notes (Signed)
Subjective No acute events.  Objective: Vital signs in last 24 hours: Temp:  [98 F (36.7 C)-99.3 F (37.4 C)] 98.3 F (36.8 C) (03/07 0530) Pulse Rate:  [83-93] 89 (03/07 0530) Resp:  [14-16] 16 (03/07 0530) BP: (100-136)/(64-80) 126/80 (03/07 0530) SpO2:  [99 %-100 %] 100 % (03/07 0530) Last BM Date: 11/16/19  Intake/Output from previous day: 03/06 0701 - 03/07 0700 In: 1795 [P.O.:1200; I.V.:10; Blood:585] Out: 375 [Urine:375] Intake/Output this shift: No intake/output data recorded.  Gen: NAD, comfortable CV: RRR Pulm: Normal work of breathing Abd: Soft, obese, nontender/nondistended Ext: SCDs in place  Lab Results: CBC  Recent Labs    11/15/19 2342 11/16/19 0814  WBC  --  9.4  HGB 7.9* 7.7*  HCT 25.8* 24.7*  PLT  --  146*   BMET No results for input(s): NA, K, CL, CO2, GLUCOSE, BUN, CREATININE, CALCIUM in the last 72 hours. PT/INR No results for input(s): LABPROT, INR in the last 72 hours. ABG No results for input(s): PHART, HCO3 in the last 72 hours.  Invalid input(s): PCO2, PO2  Studies/Results:  Anti-infectives: Anti-infectives (From admission, onward)   None       Assessment/Plan: Patient Active Problem List   Diagnosis Date Noted  . CKD (chronic kidney disease) stage 3, GFR 30-59 ml/min 11/13/2019  . Acute blood loss anemia 11/13/2019  . Lower GI bleed 08/16/2019  . Symptomatic anemia 08/03/2019  . QT prolongation 08/03/2019  . GI bleed 08/02/2019  . Diabetes mellitus type II, uncontrolled ([place]) 05/29/2019  . Hyperlipidemia associated with type 2 diabetes mellitus ([place]) 05/29/2019  . DVT (deep venous thrombosis) ([place]) 03/17/2019  . Acute on chronic renal insufficiency 03/17/2019  . Macrocytic anemia 03/17/2019  . Small bowel obstruction ([place]) 03/08/2019  . Fracture of left tibia 02/25/2019  . Left tibial fracture 02/25/2019  . Hyperglycemia 08/16/2018  . Morbid obesity due to excess calories ([place]) 05/09/2018  . Acute bronchitis with  COPD ([place]) 10/19/2017  . Atypical chest pain 05/01/2017  . Pulmonary emphysema ([place]) 04/06/2017  . Chronic seasonal allergic rhinitis 04/06/2017  . GERD (gastroesophageal reflux disease) 04/06/2017  . Snoring 04/06/2017  . Myalgia 02/01/2017  . Arthralgia 02/01/2017  . Chronic pain of toe of left foot 11/14/2016  . Chronic hepatitis C without hepatic coma ([place]) 08/01/2016  . Hypothyroidism 10/29/2015  . Mild diastolic dysfunction 41/66/0630  . Edema 01/01/2015  . Edema of both ankles 12/24/2014  . TIA (transient ischemic attack) 07/30/2014  . Weakness 07/29/2014  . Flank pain 11/12/2013  . Chronic respiratory failure ([place]) 10/25/2013  . DOE (dyspnea on exertion) 10/25/2013  . Depression with anxiety 10/15/2013  . Insomnia 10/15/2013  . HTN (hypertension) 10/09/2013  . Hypokalemia 10/09/2013   Sue [name] is a very pleasant 69 y.o. female with HTN, hypothyroidism, GERD, COPD on 3L home oxygen, poorly controlled diabetes (A1c 8), morbid obesity, malnutrition/hypoalbuminemia (admitting alb 2.7) - admitted with recurrent presumed lower GI bleeds which have yet to be localized  -I have discussed her case at length today with Dr. Cristina Gong and our strategy moving forward for her occult/non-localized but problematic GI Bleed -If she were to re-bleed or continue to drop hemoglobin, would plan for urgent endoscopic evaluation with attempts to localize and ideally control if able -Planning to avoid any prophylactic embolization at this time since things have not been localized; if this were to remain occult and difficult to find despite at least 2 further attempts at endoscopic localization & control, can consider CT angio at  time of bleed. If remains occult, subsequent provocative angiography/colonoscopy would be another potential option to further identify and control -We remain available if things change; please let us know if we can provide further assistance in her care   LOS: 4 days    Sharon Mt. Dema Severin, M.D. [facility] Use [email or web address] to contact on call provider

## 2019-11-17 NOTE — Progress Notes (Signed)
PROGRESS NOTE  Sue [name] HQI:[id number] DOB: Mar 06, 1951 DOA: 11/13/2019 PCP: Ann Held, DO   LOS: 4 days   Brief Narrative / Interim history: 69 year old female with history of DM 2, HTN, COPD on chronic 3 L nasal cannula at home, morbid obesity, who was admitted to the hospital with bright red blood per rectum for the past day.  She had a similar episode last December for which she was hospitalized, colonoscopy at that time did not reveal any source but did show diverticulosis  Subjective / 24h Interval events: Has not had any bloody bowel movements since yesterday morning  Assessment & Plan: Principal Problem Lower GI bleed with acute blood loss anemia -Hemoglobin is 5.5 on admission, received 3 units of packed red blood cells to start with, her bleeding has been on and off, received 3 additional units so far and now totaling 6 units -General surgery and GI following, she is high risk for surgery and I think GI will attempt further endoscopic evaluation hopefully in the next day or so  Active Problems Chronic hypoxic respiratory failure -She is at her baseline oxygen with 3 L, satting upper 90s.  CTA of the chest negative for PE. -Respiratory status is stable  Essential hypertension -Blood pressure stable, continue metoprolol  DM2 -Continue Lantus and sliding scale  CBG (last 3)  Recent Labs    11/16/19 2102 11/17/19 0808 11/17/19 1136  GLUCAP 163* 167* 189*    CKD stage IIIb -Baseline creatinine 1.3-1.8, remains at baseline around 1.5, recheck tomorrow  Morbid obesity -Based on a BMI of 47, will benefit from weight loss  Chronic diastolic CHF -Appears euvolemic  Scheduled Meds: . sodium chloride   Intravenous Once  . sodium chloride   Intravenous Once  . sodium chloride   Intravenous Once  . sodium chloride   Intravenous Once  . busPIRone  15 mg Oral Daily  . insulin aspart  0-15 Units Subcutaneous TID WC  . insulin aspart  0-5 Units  Subcutaneous QHS  . insulin glargine  20 Units Subcutaneous Daily  . levothyroxine  50 mcg Oral Daily  . mouth rinse  15 mL Mouth Rinse BID  . metoprolol succinate  50 mg Oral Daily  . QUEtiapine  100 mg Oral QHS  . sodium chloride flush  10-40 mL Intracatheter Q12H  . zolpidem  5 mg Oral QHS   Continuous Infusions: PRN Meds:.acetaminophen **OR** acetaminophen, alum & mag hydroxide-simeth, cyclobenzaprine, fluticasone, influenza vaccine adjuvanted, LORazepam, LORazepam, morphine injection, sodium chloride flush  DVT prophylaxis: SCDs Code Status: Full code Family Communication: Discussed with patient Patient admitted from: Home Anticipated d/c place: Home Barriers to d/c: Recurrent diverticular bleed, general surgery consultation  Consultants:  Gastroenterology  Procedures:  None   Microbiology  None   Antimicrobials: None     Objective: Vitals:   11/16/19 1710 11/16/19 2020 11/16/19 2102 11/17/19 0530  BP: 121/64 100/69 117/65 126/80  Pulse:  87 83 89  Resp:   16 16  Temp: 98 F (36.7 C) 98.6 F (37 C) 98.4 F (36.9 C) 98.3 F (36.8 C)  TempSrc: Oral Oral Oral Oral  SpO2: 100% 100% 100% 100%  Weight:      Height:        Intake/Output Summary (Last 24 hours) at 11/17/2019 1142 Last data filed at 11/17/2019 0600 Gross per 24 hour  Intake 1795 ml  Output 375 ml  Net 1420 ml   Filed Weights   11/13/19 0330  Weight: 122.5 kg  Examination:  Constitutional: No distress Eyes: No scleral icterus ENMT: Moist mucous membranes Neck: normal, supple Respiratory: Clear bilaterally without wheezing, crackles.  Normal respiratory effort Cardiovascular: Regular rate and rhythm, no murmurs.  No peripheral edema Abdomen: Soft, nontender, nondistended, positive bowel sounds Musculoskeletal: no clubbing / cyanosis.  Skin: No rashes appreciated Neurologic: Grossly nonfocal    Data Reviewed: I have independently reviewed following labs and imaging studies    CBC: Recent Labs  Lab December 13, 2019 0344 12/13/2019 2050 11/15/19 0602 11/15/19 1723 11/15/19 2342 11/16/19 0814 11/17/19 1016  WBC 9.3  --   --   --   --  9.4  --   NEUTROABS 5.6  --   --   --   --   --   --   HGB 5.5*   < > 8.0* 8.9* 7.9* 7.7* 7.4*  HCT 18.3*   < > 24.7* 27.0* 25.8* 24.7* 23.7*  MCV 96.8  --   --   --   --  95.7  --   PLT 176  --   --   --   --  146*  --    < > = values in this interval not displayed.   Basic Metabolic Panel: Recent Labs  Lab 2019-12-13 0344 11/14/19 0621  NA 138 136  K 3.1* 3.5  CL 102 104  CO2 25 25  GLUCOSE 343* 146*  BUN 29* 25*  CREATININE 1.61* 1.54*  CALCIUM 8.6* 8.4*   Liver Function Tests: Recent Labs  Lab Dec 13, 2019 0344  AST 18  ALT 12  ALKPHOS 116  BILITOT 0.4  PROT 6.4*  ALBUMIN 2.7*   Coagulation Profile: No results for input(s): INR, PROTIME in the last 168 hours. HbA1C: No results for input(s): HGBA1C in the last 72 hours. CBG: Recent Labs  Lab 11/16/19 1149 11/16/19 1649 11/16/19 2102 11/17/19 0808 11/17/19 1136  GLUCAP 236* 165* 163* 167* 189*    Recent Results (from the past 240 hour(s))  SARS CORONAVIRUS 2 (TAT 6-24 HRS) Nasopharyngeal Nasopharyngeal Swab     Status: None   Collection Time: 12-13-19  4:13 AM   Specimen: Nasopharyngeal Swab  Result Value Ref Range Status   SARS Coronavirus 2 NEGATIVE NEGATIVE Final    Comment: (NOTE) SARS-CoV-2 target nucleic acids are NOT DETECTED. The SARS-CoV-2 RNA is generally detectable in upper and lower respiratory specimens during the acute phase of infection. Negative results do not preclude SARS-CoV-2 infection, do not rule out co-infections with other pathogens, and should not be used as the sole basis for treatment or other patient management decisions. Negative results must be combined with clinical observations, patient history, and epidemiological information. The expected result is Negative. Fact Sheet for  Patients: SugarRoll.be Fact Sheet for Healthcare Providers: https://www.woods-mathews.com/ This test is not yet approved or cleared by the [place] FDA and  has been authorized for detection and/or diagnosis of SARS-CoV-2 by FDA under an Emergency Use Authorization (EUA). This EUA will remain  in effect (meaning this test can be used) for the duration of the COVID-19 declaration under Section 56 4(b)(1) of the Act, 21 U.S.C. section 360bbb-3(b)(1), unless the authorization is terminated or revoked sooner. Performed at [facility], [place] [address] [place], [place] [address]   MRSA PCR Screening     Status: None   Collection Time: 12-13-19  8:06 AM   Specimen: Nasal Mucosa; Nasopharyngeal  Result Value Ref Range Status   MRSA by PCR NEGATIVE NEGATIVE Final    Comment:  The GeneXpert MRSA Assay (FDA approved for NASAL specimens only), is one component of a comprehensive MRSA colonization surveillance program. It is not intended to diagnose MRSA infection nor to guide or monitor treatment for MRSA infections. Performed at [facility], [place] [address], [place], [place] [address]      Radiology Studies: No results found. Marzetta Board, MD, PhD [facility]  Between 7 am - 7 pm I am available, please contact me via Amion or Securechat  Between 7 pm - 7 am I am not available, please contact night coverage MD/APP via Amion

## 2019-11-18 LAB — GLUCOSE, CAPILLARY
Glucose-Capillary: 148 mg/dL — ABNORMAL HIGH (ref 70–99)
Glucose-Capillary: 171 mg/dL — ABNORMAL HIGH (ref 70–99)
Glucose-Capillary: 211 mg/dL — ABNORMAL HIGH (ref 70–99)
Glucose-Capillary: 161 mg/dL — ABNORMAL HIGH (ref 70–99)

## 2019-11-18 LAB — BASIC METABOLIC PANEL
Anion gap: 3 — ABNORMAL LOW (ref 5–15)
BUN: 31 mg/dL — ABNORMAL HIGH (ref 8–23)
CO2: 26 mmol/L (ref 22–32)
Calcium: 8.1 mg/dL — ABNORMAL LOW (ref 8.9–10.3)
Chloride: 109 mmol/L (ref 98–111)
Creatinine, Ser: 1.31 mg/dL — ABNORMAL HIGH (ref 0.44–1.00)
GFR calc Af Amer: 49 mL/min — ABNORMAL LOW (ref >60)
GFR calc non Af Amer: 42 mL/min — ABNORMAL LOW (ref >60)
Glucose, Bld: 177 mg/dL — ABNORMAL HIGH (ref 70–99)
Potassium: 4.5 mmol/L (ref 3.5–5.1)
Sodium: 138 mmol/L (ref 135–145)

## 2019-11-18 LAB — CBC
HCT: 24.6 % — ABNORMAL LOW (ref 36.0–46.0)
Hemoglobin: 7.7 g/dL — ABNORMAL LOW (ref 12.0–15.0)
MCH: 30.3 pg (ref 26.0–34.0)
MCHC: 31.3 g/dL (ref 30.0–36.0)
MCV: 96.9 fL (ref 80.0–100.0)
Platelets: 142 10*3/uL — ABNORMAL LOW (ref 150–400)
RBC: 2.54 MIL/uL — ABNORMAL LOW (ref 3.87–5.11)
RDW: 16.9 % — ABNORMAL HIGH (ref 11.5–15.5)
WBC: 9.4 10*3/uL (ref 4.0–10.5)
nRBC: 0.4 % — ABNORMAL HIGH (ref 0.0–0.2)

## 2019-11-18 MED ORDER — SODIUM CHLORIDE 0.9 % IV SOLN: INTRAVENOUS | Status: DC

## 2019-11-18 NOTE — Consult Note (Signed)
   [facility] CM Inpatient Consult   11/18/2019  Sue [name] 01-Sep-1951 [id number]   Patient chart has been reviewed for potential [facility] CM) services due to unplanned readmission risk score of 39%, extreme, and 3 hospitalizations within past 6 months.   Hospital liaison will continue to follow for progression and disposition plans.  Netta Cedars, MSN, [facility] Liaison Nurse Mobile Phone [phone]  Toll free office [phone]

## 2019-11-18 NOTE — Progress Notes (Signed)
Patient states that her last bleeding was yesterday afternoon.  She had several dark burgundy type stools, semiformed.  Has required 1 unit of paced cells each of the past 3 days, and is basically maintaining her hgb between 7.5 and 8.5.  Remains hemodynamically stable and asymptomatic, except for feeling "tired."  Patient is sitting up in bed in no distress, fully alert, no respiratory distress, skin warm and well perfused, radial pulse slightly weak but not thready.  Impression: It appears patient either has stopped bleeding (and her hemoglobin reflects equilibration), or the rapidity of bleeding has slowed significantly.  Plan:  1.  Extensive conversation yesterday with Dr. Annye English of surgery.  He and I are somewhat uncomfortable attributing her bleeding to her (reportedly minimal) sigmoid diverticulosis.  There has certainly never been objective confirmation of a sigmoid colonic source, such as a positive direct inspection of an oozing diverticulum, or positive bleeding scan or angiogram.  In fact, there has been evidence of blood throughout the colon on colonoscopy, which of course could reflect refluxed blood from a more distal source such as the sigmoid.  That said, however, Dr. Dema Severin feels that if the patient were to have colonic surgery, it would have to be a total colectomy, which would be a major undertaking.  2.  Unprepped colonoscopy tomorrow to try to see if I can get any idea of localization of blood within the GI tract.  For example, if there was evidence of significant old blood in the proximal colon, that might suggest a small bowel source.  Patient agreeable with this plan.  Scheduled for 830 tomorrow morning.  3.  Also tomorrow, we will do a capsule endoscopy to help exclude a small bowel source masquerading as a colonic diverticular bleed.  Procedure explained to patient, she is agreeable.  4.  Obviously, continue to monitor hemoglobin, since it is hard to know, from passage of  blood per rectum alone, to what degree the patient is having ongoing bleeding.  Cleotis Nipper, M.D. Pager [phone] If no answer or after 5 PM call [phone]

## 2019-11-18 NOTE — Progress Notes (Signed)
PROGRESS NOTE  Sue [name] KPT:[id number] DOB: 05-14-1951 DOA: 11/13/2019 PCP: Ann Held, DO   LOS: 5 days   Brief Narrative / Interim history: 69 year old female with history of DM 2, HTN, COPD on chronic 3 L nasal cannula at home, morbid obesity, who was admitted to the hospital with bright red blood per rectum for the past day.  She had a similar episode last December for which she was hospitalized, colonoscopy at that time did not reveal any source but did show diverticulosis  Subjective / 24h Interval events: Had another bloody bowel movement yesterday around 2 PM, no lightheadedness or dizziness  Assessment & Plan: Principal Problem Lower GI bleed with acute blood loss anemia -Hemoglobin is 5.5 on admission, received 3 units of packed red blood cells to start with, her bleeding has been on and off, received 3 additional units so far and now totaling 6 units.  Hemoglobin still in the 7 range -General surgery and GI following, she is high risk for surgery and she needs to be further evaluated by GI to confirm that there is really no clear source -Discussed with Dr. Cristina Gong this morning, he will evaluate and potentially further evaluate endoscopy  Active Problems Chronic hypoxic respiratory failure -She is at her baseline oxygen with 3 L, satting upper 90s.  CTA of the chest negative for PE. -Respiratory status remained stable  Essential hypertension -Continue metoprolol, blood pressure within normal limits  DM2 -Continue Lantus and sliding scale, CBGs well-controlled  CBG (last 3)  Recent Labs    11/17/19 1735 11/17/19 1933 11/18/19 0755  GLUCAP 173* 166* 148*    CKD stage IIIb -Baseline creatinine 1.3-1.8, creatinine at baseline this morning  Morbid obesity -Based on a BMI of 47, will benefit from weight loss  Chronic diastolic CHF -Appears euvolemic  Scheduled Meds: . sodium chloride   Intravenous Once  . sodium chloride   Intravenous Once  .  sodium chloride   Intravenous Once  . sodium chloride   Intravenous Once  . busPIRone  15 mg Oral Daily  . insulin aspart  0-15 Units Subcutaneous TID WC  . insulin aspart  0-5 Units Subcutaneous QHS  . insulin glargine  20 Units Subcutaneous Daily  . levothyroxine  50 mcg Oral Daily  . mouth rinse  15 mL Mouth Rinse BID  . metoprolol succinate  50 mg Oral Daily  . QUEtiapine  100 mg Oral QHS  . sodium chloride flush  10-40 mL Intracatheter Q12H  . zolpidem  5 mg Oral QHS   Continuous Infusions: PRN Meds:.acetaminophen **OR** acetaminophen, alum & mag hydroxide-simeth, cyclobenzaprine, fluticasone, influenza vaccine adjuvanted, LORazepam, LORazepam, morphine injection, sodium chloride flush  DVT prophylaxis: SCDs Code Status: Full code Family Communication: Discussed with patient Patient admitted from: Home Anticipated d/c place: Home Barriers to d/c: Recurrent diverticular bleed, general surgery consultation  Consultants:  Gastroenterology  Procedures:  None   Microbiology  None   Antimicrobials: None     Objective: Vitals:   11/17/19 1409 11/17/19 1453 11/17/19 1806 11/17/19 1940  BP: 117/64 126/61 125/84 129/76  Pulse: 85   89  Resp: 16 20 20    Temp: 99 F (37.2 C) 98 F (36.7 C) 98 F (36.7 C) 98 F (36.7 C)  TempSrc: Oral Oral Oral Oral  SpO2: 100% 100% 100% 98%  Weight:      Height:        Intake/Output Summary (Last 24 hours) at 11/18/2019 0941 Last data filed at 11/18/2019 0600  Gross per 24 hour  Intake 1110 ml  Output --  Net 1110 ml   Filed Weights   11/13/19 0330  Weight: 122.5 kg    Examination:  Constitutional: No distress, in bed Eyes: No scleral icterus ENMT: Moist mucous membranes Neck: normal, supple Respiratory: Clear bilaterally, no wheezing, no crackles, normal respiratory effort Cardiovascular: Regular rate and rhythm, no murmurs appreciated.  No edema Abdomen: Soft, nontender, nondistended, positive bowel  sounds Musculoskeletal: no clubbing / cyanosis.  Skin: No rashes seen Neurologic: Grossly nonfocal    Data Reviewed: I have independently reviewed following labs and imaging studies   CBC: Recent Labs  Lab 11/13/19 0344 11/13/19 2050 11/15/19 2342 11/16/19 0814 11/17/19 1016 11/17/19 2027 11/18/19 0511  WBC 9.3  --   --  9.4  --   --  9.4  NEUTROABS 5.6  --   --   --   --   --   --   HGB 5.5*   < > 7.9* 7.7* 7.4* 8.6* 7.7*  HCT 18.3*   < > 25.8* 24.7* 23.7* 27.5* 24.6*  MCV 96.8  --   --  95.7  --   --  96.9  PLT 176  --   --  146*  --   --  142*   < > = values in this interval not displayed.   Basic Metabolic Panel: Recent Labs  Lab 11/13/19 0344 11/14/19 0621 11/18/19 0511  NA 138 136 138  K 3.1* 3.5 4.5  CL 102 104 109  CO2 25 25 26   GLUCOSE 343* 146* 177*  BUN 29* 25* 31*  CREATININE 1.61* 1.54* 1.31*  CALCIUM 8.6* 8.4* 8.1*   Liver Function Tests: Recent Labs  Lab 11/13/19 0344  AST 18  ALT 12  ALKPHOS 116  BILITOT 0.4  PROT 6.4*  ALBUMIN 2.7*   Coagulation Profile: No results for input(s): INR, PROTIME in the last 168 hours. HbA1C: No results for input(s): HGBA1C in the last 72 hours. CBG: Recent Labs  Lab 11/17/19 0808 11/17/19 1136 11/17/19 1735 11/17/19 1933 11/18/19 0755  GLUCAP 167* 189* 173* 166* 148*    Recent Results (from the past 240 hour(s))  SARS CORONAVIRUS 2 (TAT 6-24 HRS) Nasopharyngeal Nasopharyngeal Swab     Status: None   Collection Time: 11/13/19  4:13 AM   Specimen: Nasopharyngeal Swab  Result Value Ref Range Status   SARS Coronavirus 2 NEGATIVE NEGATIVE Final    Comment: (NOTE) SARS-CoV-2 target nucleic acids are NOT DETECTED. The SARS-CoV-2 RNA is generally detectable in upper and lower respiratory specimens during the acute phase of infection. Negative results do not preclude SARS-CoV-2 infection, do not rule out co-infections with other pathogens, and should not be used as the sole basis for treatment or  other patient management decisions. Negative results must be combined with clinical observations, patient history, and epidemiological information. The expected result is Negative. Fact Sheet for Patients: SugarRoll.be Fact Sheet for Healthcare Providers: https://www.woods-mathews.com/ This test is not yet approved or cleared by the [place] FDA and  has been authorized for detection and/or diagnosis of SARS-CoV-2 by FDA under an Emergency Use Authorization (EUA). This EUA will remain  in effect (meaning this test can be used) for the duration of the COVID-19 declaration under Section 56 4(b)(1) of the Act, 21 U.S.C. section 360bbb-3(b)(1), unless the authorization is terminated or revoked sooner. Performed at [facility], [place] [address], [place], [place] [address]   MRSA PCR Screening  Status: None   Collection Time: 11/13/19  8:06 AM   Specimen: Nasal Mucosa; Nasopharyngeal  Result Value Ref Range Status   MRSA by PCR NEGATIVE NEGATIVE Final    Comment:        The GeneXpert MRSA Assay (FDA approved for NASAL specimens only), is one component of a comprehensive MRSA colonization surveillance program. It is not intended to diagnose MRSA infection nor to guide or monitor treatment for MRSA infections. Performed at [facility], [place] [address], [place], [place] [address]      Radiology Studies: No results found. Marzetta Board, MD, PhD [facility]  Between 7 am - 7 pm I am available, please contact me via Amion or Securechat  Between 7 pm - 7 am I am not available, please contact night coverage MD/APP via Amion

## 2019-11-19 ENCOUNTER — Inpatient Hospital Stay (HOSPITAL_COMMUNITY): Payer: Medicare HMO | Admitting: Registered Nurse

## 2019-11-19 ENCOUNTER — Encounter (HOSPITAL_COMMUNITY)
Admission: EM | Disposition: A | Discharge status: Home Health | Payer: Self-pay | Source: Home / Self Care | Attending: Internal Medicine

## 2019-11-19 ENCOUNTER — Encounter (HOSPITAL_COMMUNITY): Payer: Self-pay | Admitting: Internal Medicine

## 2019-11-19 HISTORY — PX: GIVENS CAPSULE STUDY: SHX5432

## 2019-11-19 HISTORY — PX: SUBMUCOSAL TATTOO INJECTION: SHX6856

## 2019-11-19 HISTORY — PX: COLONOSCOPY WITH PROPOFOL: SHX5780

## 2019-11-19 HISTORY — PX: HEMOSTASIS CLIP PLACEMENT: SHX6857

## 2019-11-19 LAB — CBC
HCT: 21.2 % — ABNORMAL LOW (ref 36.0–46.0)
Hemoglobin: 6.5 g/dL — CL (ref 12.0–15.0)
MCH: 29.8 pg (ref 26.0–34.0)
MCHC: 30.7 g/dL (ref 30.0–36.0)
MCV: 97.2 fL (ref 80.0–100.0)
Platelets: 149 10*3/uL — ABNORMAL LOW (ref 150–400)
RBC: 2.18 MIL/uL — ABNORMAL LOW (ref 3.87–5.11)
RDW: 16.9 % — ABNORMAL HIGH (ref 11.5–15.5)
WBC: 9.1 10*3/uL (ref 4.0–10.5)
nRBC: 0.5 % — ABNORMAL HIGH (ref 0.0–0.2)

## 2019-11-19 LAB — GLUCOSE, CAPILLARY
Glucose-Capillary: 148 mg/dL — ABNORMAL HIGH (ref 70–99)
Glucose-Capillary: 146 mg/dL — ABNORMAL HIGH (ref 70–99)
Glucose-Capillary: 165 mg/dL — ABNORMAL HIGH (ref 70–99)
Glucose-Capillary: 199 mg/dL — ABNORMAL HIGH (ref 70–99)

## 2019-11-19 LAB — BASIC METABOLIC PANEL
Anion gap: 4 — ABNORMAL LOW (ref 5–15)
BUN: 29 mg/dL — ABNORMAL HIGH (ref 8–23)
CO2: 22 mmol/L (ref 22–32)
Calcium: 7.9 mg/dL — ABNORMAL LOW (ref 8.9–10.3)
Chloride: 110 mmol/L (ref 98–111)
Creatinine, Ser: 1.18 mg/dL — ABNORMAL HIGH (ref 0.44–1.00)
GFR calc Af Amer: 55 mL/min — ABNORMAL LOW (ref >60)
GFR calc non Af Amer: 48 mL/min — ABNORMAL LOW (ref >60)
Glucose, Bld: 161 mg/dL — ABNORMAL HIGH (ref 70–99)
Potassium: 4.1 mmol/L (ref 3.5–5.1)
Sodium: 136 mmol/L (ref 135–145)

## 2019-11-19 LAB — PREPARE RBC (CROSSMATCH)

## 2019-11-19 LAB — HEMOGLOBIN AND HEMATOCRIT, BLOOD
HCT: 26.9 % — ABNORMAL LOW (ref 36.0–46.0)
Hemoglobin: 8.7 g/dL — ABNORMAL LOW (ref 12.0–15.0)

## 2019-11-19 SURGERY — COLONOSCOPY WITH PROPOFOL
Anesthesia: Monitor Anesthesia Care

## 2019-11-19 MED ORDER — PROPOFOL 500 MG/50ML IV EMUL
INTRAVENOUS | Status: DC | PRN
  Administered 2019-11-19: 20 ug via INTRAVENOUS
  Administered 2019-11-19: 100 ug/kg/min via INTRAVENOUS
  Administered 2019-11-19: 20 mg via INTRAVENOUS

## 2019-11-19 MED ORDER — PHENYLEPHRINE 40 MCG/ML (10ML) SYRINGE FOR IV PUSH (FOR BLOOD PRESSURE SUPPORT)
PREFILLED_SYRINGE | INTRAVENOUS | Status: DC | PRN
  Administered 2019-11-19 (×2): 80 ug via INTRAVENOUS

## 2019-11-19 MED ORDER — POLYETHYLENE GLYCOL 3350 17 G PO PACK
17.0000 g | PACK | Freq: Two times a day (BID) | ORAL | Status: DC
  Administered 2019-11-19: 17 g via ORAL
  Filled 2019-11-19 (×2): qty 1

## 2019-11-19 MED ORDER — SODIUM CHLORIDE 0.9% IV SOLUTION: Freq: Once | INTRAVENOUS | Status: DC

## 2019-11-19 MED ORDER — LACTATED RINGERS IV SOLN
INTRAVENOUS | Status: AC | PRN
  Administered 2019-11-19: 1000 mL via INTRAVENOUS

## 2019-11-19 MED ORDER — SPOT INK MARKER SYRINGE KIT
PACK | SUBMUCOSAL | Status: DC | PRN
  Administered 2019-11-19: 1.5 mL via SUBMUCOSAL

## 2019-11-19 MED ORDER — PROPOFOL 500 MG/50ML IV EMUL
INTRAVENOUS | Status: AC
  Filled 2019-11-19: qty 50

## 2019-11-19 MED ORDER — ONDANSETRON HCL 4 MG/2ML IJ SOLN
INTRAMUSCULAR | Status: DC | PRN
  Administered 2019-11-19: 4 mg via INTRAVENOUS

## 2019-11-19 SURGICAL SUPPLY — 22 items
ELECT REM PT RETURN 9FT ADLT (ELECTROSURGICAL)
ELECTRODE REM PT RTRN 9FT ADLT (ELECTROSURGICAL) IMPLANT
FCP BXJMBJMB 240X2.8X (CUTTING FORCEPS)
FLOOR PAD 36X40 (MISCELLANEOUS) ×4
FORCEPS BIOP RAD 4 LRG CAP 4 (CUTTING FORCEPS) IMPLANT
FORCEPS BIOP RJ4 240 W/NDL (CUTTING FORCEPS)
FORCEPS BXJMBJMB 240X2.8X (CUTTING FORCEPS) IMPLANT
INJECTOR/SNARE I SNARE (MISCELLANEOUS) IMPLANT
LUBRICANT JELLY 4.5OZ STERILE (MISCELLANEOUS) IMPLANT
MANIFOLD NEPTUNE II (INSTRUMENTS) IMPLANT
NEEDLE SCLEROTHERAPY 25GX240 (NEEDLE) IMPLANT
PAD FLOOR 36X40 (MISCELLANEOUS) ×2 IMPLANT
PROBE APC STR FIRE (PROBE) IMPLANT
PROBE INJECTION GOLD (MISCELLANEOUS)
PROBE INJECTION GOLD 7FR (MISCELLANEOUS) IMPLANT
SNARE ROTATE MED OVAL 20MM (MISCELLANEOUS) IMPLANT
SYR 50ML LL SCALE MARK (SYRINGE) IMPLANT
TOWEL COTTON PACK 4EA (MISCELLANEOUS) ×8 IMPLANT
TRAP SPECIMEN MUCOUS 40CC (MISCELLANEOUS) IMPLANT
TUBING ENDO SMARTCAP PENTAX (MISCELLANEOUS) IMPLANT
TUBING IRRIGATION ENDOGATOR (MISCELLANEOUS) ×4 IMPLANT
WATER STERILE IRR 1000ML POUR (IV SOLUTION) IMPLANT

## 2019-11-19 NOTE — Anesthesia Preprocedure Evaluation (Addendum)
Anesthesia Evaluation  Patient identified by MRN, date of birth, ID band Patient awake    Reviewed: Allergy & Precautions, NPO status , Patient's Chart, lab work & pertinent test results, reviewed documented beta blocker date and time   Airway Mallampati: II  TM Distance: >3 FB Neck ROM: Full    Dental  (+) Partial Upper, Dental Advisory Given, Poor Dentition,    Pulmonary COPD,  COPD inhaler and oxygen dependent, former smoker,  3L O2 by [place] at home  Former smoker quit 2013, 40 pack year history     + decreased breath sounds      Cardiovascular hypertension, Pt. on medications and Pt. on home beta blockers +CHF and + DOE   Rhythm:Regular Rate:Normal  Last echo 2018: Left ventricle: Proximal septal thickening The cavity size was  normal. Wall thickness was increased in a pattern of mild LVH.  Systolic function was vigorous. The estimated ejection fraction  was in the range of 65% to 70%. Doppler parameters are consistent  with abnormal left ventricular relaxation (grade 1 diastolic  dysfunction).    Neuro/Psych PSYCHIATRIC DISORDERS Anxiety Depression TIA 2016 TIA   GI/Hepatic GERD  Medicated and Controlled,(+) Hepatitis -, CHCV fully treated per pt- unsure? Hematochezia, ABLA   Endo/Other  diabetes, Type 2, Insulin Dependent, Oral Hypoglycemic AgentsHypothyroidism Morbid obesityBMI 48 Last A1c 7.9  Renal/GU Renal InsufficiencyRenal diseaseCr 1.3, CKD 3 Bladder dysfunction  Urinary incontinence    Musculoskeletal negative musculoskeletal ROS (+)   Abdominal (+) + obese,   Peds  Hematology  (+) Blood dyscrasia, anemia , H/H 7.7/24.6 plt 142   Anesthesia Other Findings   Reproductive/Obstetrics negative OB ROS                           Anesthesia Physical Anesthesia Plan  ASA: IV  Anesthesia Plan: MAC   Post-op Pain Management:    Induction:   PONV Risk Score and  Plan: 2 and Propofol infusion and TIVA  Airway Management Planned: Natural Airway and Simple Face Mask  Additional Equipment: None  Intra-op Plan:   Post-operative Plan:   Informed Consent: I have reviewed the patients History and Physical, chart, labs and discussed the procedure including the risks, benefits and alternatives for the proposed anesthesia with the patient or authorized representative who has indicated his/her understanding and acceptance.       Plan Discussed with: CRNA  Anesthesia Plan Comments: (Most recent H/H this AM 6.5/21- will need blood before procedure- will start transfusing 1 unit intraop)      Anesthesia Quick Evaluation

## 2019-11-19 NOTE — Progress Notes (Signed)
PROGRESS NOTE  BREINDY MEADOW TFT:[id number] DOB: [birth year] DOA: 11/13/2019 PCP: Ann Held, DO   LOS: 6 days   Brief Narrative / Interim history: 69 year old female with history of DM 2, HTN, COPD on chronic 3 L nasal cannula at home, morbid obesity, who was admitted to the hospital with bright red blood per rectum for the past day.  She had a similar episode last December for which she was hospitalized, colonoscopy at that time did not reveal any source but did show diverticulosis  Subjective / 24h Interval events: Seen following the colonoscopy, doing well.  Assessment & Plan: Principal Problem Lower GI bleed with acute blood loss anemia -Hemoglobin is 5.5 on admission, requiring multiple blood transfusions with adequate improvement but then trending down again.  Up until today received a total of 6 units, hemoglobin this morning is again in the 6 range and will transfuse 2 additional units for a total of 8 units now -Discussed this morning with Dr. Cristina Gong, he performed an unprepped colonoscopy which showed potential source of bleeding in the cecal/appendiceal region but no clear cause identified, no AVM/polyps.  Tattooed and clipped with resolution of bleeding.  Patient does take several ibuprofens a week and this potentially may be NSAID induced cecal bleeding -Plan for capsule endoscopy today, and potentially a colonoscopy with prep later this week perhaps on Thursday to reevaluate the cecal area for potential etiology of bleeding/biopsies.  Final plan deferred to Dr Cristina Gong -Continue to monitor H&H, transfuse with goal hemoglobin greater than 7 -General surgery also consulted and are following peripherally  Active Problems Chronic hypoxic respiratory failure -She is at her baseline oxygen with 3 L, satting upper 90s.  CTA of the chest negative for PE. -Respiratory status stable, tolerated colonoscopy well  Essential hypertension -Continue metoprolol, blood pressure  acceptable  DM2 -Continue Lantus and sliding scale, CBGs acceptable  CBG (last 3)  Recent Labs    11/18/19 1653 11/18/19 2113 11/19/19 0737  GLUCAP 211* 161* 148*    CKD stage IIIb -Baseline creatinine 1.3-1.8, creatinine at baseline  Morbid obesity -Based on a BMI of 47, will benefit from weight loss  Chronic diastolic CHF -Appears euvolemic  Scheduled Meds: . [MAR Hold] sodium chloride   Intravenous Once  . [MAR Hold] sodium chloride   Intravenous Once  . [MAR Hold] sodium chloride   Intravenous Once  . [MAR Hold] sodium chloride   Intravenous Once  . [MAR Hold] sodium chloride   Intravenous Once  . [MAR Hold] busPIRone  15 mg Oral Daily  . [MAR Hold] insulin aspart  0-15 Units Subcutaneous TID WC  . [MAR Hold] insulin aspart  0-5 Units Subcutaneous QHS  . [MAR Hold] insulin glargine  20 Units Subcutaneous Daily  . [MAR Hold] levothyroxine  50 mcg Oral Daily  . [MAR Hold] mouth rinse  15 mL Mouth Rinse BID  . [MAR Hold] metoprolol succinate  50 mg Oral Daily  . [MAR Hold] QUEtiapine  100 mg Oral QHS  . [MAR Hold] sodium chloride flush  10-40 mL Intracatheter Q12H  . [MAR Hold] zolpidem  5 mg Oral QHS   Continuous Infusions: . sodium chloride Stopped (11/19/19 0929)   PRN Meds:.[MAR Hold] acetaminophen **OR** [MAR Hold] acetaminophen, [MAR Hold] alum & mag hydroxide-simeth, [MAR Hold] cyclobenzaprine, [MAR Hold] fluticasone, influenza vaccine adjuvanted, [MAR Hold] LORazepam, [MAR Hold] LORazepam, [MAR Hold]  morphine injection, [MAR Hold] sodium chloride flush  DVT prophylaxis: SCDs Code Status: Full code Family Communication: Discussed with patient  Patient admitted from: Home Anticipated d/c place: Home Barriers to d/c: Recurrent diverticular bleed, general surgery consultation  Consultants:  Gastroenterology General surgery   Procedures:  Colonoscopy 3/9 Impression:               - Old blood in the entire examined colon.                           -  Friability with spontaneous minimal bleeding in                            what I believe was the cecum/appendiceal orifice                            region. Clips were placed. Tattooed.                           - No specimens collected.                           - It is unclear whether the observed oozing                            accounts for the patient's continuing blood loss,                            and/or whether it is a localized problem or                            indicative of a more generalized process.  Microbiology  None   Antimicrobials: None     Objective: Vitals:   11/19/19 0546 11/19/19 0821 11/19/19 0937 11/19/19 0945  BP: (!) 113/58 (!) 92/45 (!) 126/98   Pulse: 93   92  Resp: 18 (!) 29 (!) 28 (!) 28  Temp: 98 F (36.7 C) 98.4 F (36.9 C) 98.5 F (36.9 C)   TempSrc: Oral Oral Axillary   SpO2: 100% 98% 100% 100%  Weight:      Height:        Intake/Output Summary (Last 24 hours) at 11/19/2019 0954 Last data filed at 11/19/2019 0940 Gross per 24 hour  Intake 925 ml  Output 451 ml  Net 474 ml   Filed Weights   11/13/19 0330  Weight: 122.5 kg    Examination:  Constitutional: No distress Eyes: No scleral icterus ENMT: Moist mucous membranes Neck: normal, supple Respiratory: Clear bilaterally without wheezing or crackles, normal respiratory effort Cardiovascular: Regular rate and rhythm, no murmurs Abdomen: Soft, NT, ND, bowel sounds positive Musculoskeletal: no clubbing / cyanosis.  Skin: No rashes seen Neurologic: No focal deficits   Data Reviewed: I have independently reviewed following labs and imaging studies   CBC: Recent Labs  Lab 11/13/19 0344 11/13/19 2050 11/16/19 0814 11/17/19 1016 11/17/19 2027 11/18/19 0511 11/19/19 0711  WBC 9.3  --  9.4  --   --  9.4 9.1  NEUTROABS 5.6  --   --   --   --   --   --   HGB 5.5*   < > 7.7* 7.4* 8.6* 7.7* 6.5*  HCT 18.3*   < > 24.7* 23.7* 27.5* 24.6*  21.2*  MCV 96.8  --  95.7  --   --   96.9 97.2  PLT 176  --  146*  --   --  142* 149*   < > = values in this interval not displayed.   Basic Metabolic Panel: Recent Labs  Lab 11/13/19 0344 11/14/19 0621 11/18/19 0511 11/19/19 0711  NA 138 136 138 136  K 3.1* 3.5 4.5 4.1  CL 102 104 109 110  CO2 25 25 26 22   GLUCOSE 343* 146* 177* 161*  BUN 29* 25* 31* 29*  CREATININE 1.61* 1.54* 1.31* 1.18*  CALCIUM 8.6* 8.4* 8.1* 7.9*   Liver Function Tests: Recent Labs  Lab 11/13/19 0344  AST 18  ALT 12  ALKPHOS 116  BILITOT 0.4  PROT 6.4*  ALBUMIN 2.7*   Coagulation Profile: No results for input(s): INR, PROTIME in the last 168 hours. HbA1C: No results for input(s): HGBA1C in the last 72 hours. CBG: Recent Labs  Lab 11/18/19 0755 11/18/19 1212 11/18/19 1653 11/18/19 2113 11/19/19 0737  GLUCAP 148* 171* 211* 161* 148*    Recent Results (from the past 240 hour(s))  SARS CORONAVIRUS 2 (TAT 6-24 HRS) Nasopharyngeal Nasopharyngeal Swab     Status: None   Collection Time: 11/13/19  4:13 AM   Specimen: Nasopharyngeal Swab  Result Value Ref Range Status   SARS Coronavirus 2 NEGATIVE NEGATIVE Final    Comment: (NOTE) SARS-CoV-2 target nucleic acids are NOT DETECTED. The SARS-CoV-2 RNA is generally detectable in upper and lower respiratory specimens during the acute phase of infection. Negative results do not preclude SARS-CoV-2 infection, do not rule out co-infections with other pathogens, and should not be used as the sole basis for treatment or other patient management decisions. Negative results must be combined with clinical observations, patient history, and epidemiological information. The expected result is Negative. Fact Sheet for Patients: SugarRoll.be Fact Sheet for Healthcare Providers: https://www.woods-mathews.com/ This test is not yet approved or cleared by the [place] FDA and  has been authorized for detection and/or diagnosis of SARS-CoV-2 by FDA  under an Emergency Use Authorization (EUA). This EUA will remain  in effect (meaning this test can be used) for the duration of the COVID-19 declaration under Section 56 4(b)(1) of the Act, 21 U.S.C. section 360bbb-3(b)(1), unless the authorization is terminated or revoked sooner. Performed at [facility], [place] [address] [place], [place] [address]   MRSA PCR Screening     Status: None   Collection Time: 11/13/19  8:06 AM   Specimen: Nasal Mucosa; Nasopharyngeal  Result Value Ref Range Status   MRSA by PCR NEGATIVE NEGATIVE Final    Comment:        The GeneXpert MRSA Assay (FDA approved for NASAL specimens only), is one component of a comprehensive MRSA colonization surveillance program. It is not intended to diagnose MRSA infection nor to guide or monitor treatment for MRSA infections. Performed at [facility], [place] [address], [place], [place] [address]      Radiology Studies: No results found. Marzetta Board, MD, PhD [facility]  Between 7 am - 7 pm I am available, please contact me via Amion or Securechat  Between 7 pm - 7 am I am not available, please contact night coverage MD/APP via Amion

## 2019-11-19 NOTE — Progress Notes (Signed)
Patient's hemoglobin dropped overnight from 7.7 to a current level of 6.5, without any symptoms on the part of the patient, such as respiratory distress, or other clinical stability.  Interestingly, she has not even had any bowel movements for for 36 hours, and there has been no rise in her BUN to suggest an upper tract source of bleeding.  The patient, however, has never had endoscopic evaluation.  Plan: As per yesterday's note, will start with unprepped colonoscopy this morning, to try to get "the lay of the land" with respect to distribution of blood within the colon and see whether there is any apparent focus of the origin of the blood within the colon.  Thereafter, we will do a capsule endoscopy to help clear the upper tract of any evident bleeding source.  Cleotis Nipper, M.D. Pager [phone] If no answer or after 5 PM call [phone]

## 2019-11-19 NOTE — Op Note (Signed)
[facility] Patient Name: Sue [name] Procedure Date: 11/19/2019 MRN: [id number] Attending MD: Ronald Lobo , MD Date of Birth: Apr 04, 1951 CSN: [id number] Age: 69 Admit Type: Inpatient Procedure:                Colonoscopy Indications:              Last colonoscopy: December 2020, Hematochezia (dark                            burgundy stools prompting admission and colonoscopy                            in 11/202 and 08/2019), now with recurrence; has                            received multiple transfusions totalling 11 units                            of packed cells since November. Previous                            colonoscopies have shown mild sigmoid                            diverticulosis. This exam is being done                            intentionally unprepped, to try to ascertain the                            distribution of blood within the colon and to                            determine whether fresh blood is present anywhere,                            and thereby to try to localize the source of the                            bleeding. Providers:                Ronald Lobo, MD, Kary Kos, RN, Elspeth Cho Tech., Technician, Courtney Heys Armistead, CRNA Referring MD:              Medicines:                Monitored Anesthesia Care Complications:            No immediate complications. Estimated Blood Loss:     Estimated blood loss was minimal. Procedure:                Pre-Anesthesia Assessment:                           - Prior to the  procedure, a History and Physical                            was performed, and patient medications and                            allergies were reviewed. The patient's tolerance of                            previous anesthesia was also reviewed. The risks                            and benefits of the procedure and the sedation                            options and risks were  discussed with the patient.                            All questions were answered, and informed consent                            was obtained. Prior Anticoagulants: The patient has                            taken no previous anticoagulant or antiplatelet                            agents. ASA Grade Assessment: IV - A patient with                            severe systemic disease that is a constant threat                            to life. After reviewing the risks and benefits,                            the patient was deemed in satisfactory condition to                            undergo the procedure.                           After obtaining informed consent, the colonoscope                            was passed under direct vision. Throughout the                            procedure, the patient's blood pressure, pulse, and                            oxygen saturations were monitored continuously. The  PCF-H190DL ([id number]) Olympus pediatric colonscope                            was introduced through the anus and advanced to                            what I believe was the cecum, identified by what                            appeared to be the appendiceal orifice. The                            colonoscopy was performed without difficulty. The                            patient tolerated the procedure well. The quality                            of the bowel preparation (unprepped) was adequate. Scope In: 8:53:52 AM Scope Out: 9:26:47 AM Scope Withdrawal Time: 0 hours 10 minutes 54 seconds  Total Procedure Duration: 0 hours 32 minutes 55 seconds  Findings:      The perianal and digital rectal examinations were normal.      Old dark blood was found in the entire colon, without any specific       localization; it coated the entire colonic lumen.      The exam was otherwise grossly normal throughout the examined colon,       although the unprepped  character of the exam limited visualization       significantly. When rinsed, the colonic mucosa generally appeared normal       except in the cecum.      Moderately friable mucosa with spontaneous bleeding (very gradual       oozing, which persisted after rinsing) was found in the cecum. For       hemostasis, two hemostatic clips were successfully placed. There was no       bleeding at the end of the procedure. This area did not appear       ulcerated, inflamed, or neoplastic. Area was tattooed on either side       with an injection of a total of approximately 2 mL of Spot (carbon       black).      Retroflexion was not performed in the rectum. Impression:               - Old blood in the entire examined colon.                           - Friability with spontaneous minimal bleeding in                            what I believe was the cecum/appendiceal orifice                            region. Clips were placed. Tattooed.                           -  No specimens collected.                           - It is unclear whether the observed oozing                            accounts for the patient's continuing blood loss,                            and/or whether it is a localized problem or                            indicative of a more generalized process. Moderate Sedation:      This patient was sedated with monitored anesthesia care, not moderate       sedation. Recommendation:           - To visualize the small bowel, perform video                            capsule endoscopy today. Procedure Code(s):        --- Professional ---                           [id number], Colonoscopy, flexible; with control of                            bleeding, any method                           45381, 59, Colonoscopy, flexible; with directed                            submucosal injection(s), any substance Diagnosis Code(s):        --- Professional ---                           K92.2, Gastrointestinal  hemorrhage, unspecified                           K92.1, Melena (includes Hematochezia) CPT copyright 2019 American Medical Association. All rights reserved. The codes documented in this report are preliminary and upon coder review may  be revised to meet current compliance requirements. Ronald Lobo, MD 11/19/2019 9:47:27 AM This report has been signed electronically. Number of Addenda: 0

## 2019-11-19 NOTE — Progress Notes (Signed)
Patient off unit for procedure.

## 2019-11-19 NOTE — Transfer of Care (Signed)
Immediate Anesthesia Transfer of Care Note  Patient: Sue [name]  Procedure(s) Performed: COLONOSCOPY WITH PROPOFOL (N/A ) GIVENS CAPSULE STUDY (N/A ) HEMOSTASIS CLIP PLACEMENT SUBMUCOSAL TATTOO INJECTION  Patient Location: PACU and Endoscopy Unit  Anesthesia Type:MAC  Level of Consciousness: awake, alert , oriented and patient cooperative  Airway & Oxygen Therapy: Patient Spontanous Breathing and Patient connected to face mask oxygen  Post-op Assessment: Report given to RN, Post -op Vital signs reviewed and stable and Patient moving all extremities  Post vital signs: Reviewed and stable  Last Vitals:  Vitals Value Taken Time  BP    Temp    Pulse 92 11/19/19 0939  Resp 19 11/19/19 0939  SpO2 100 % 11/19/19 0939  Vitals shown include unvalidated device data.  Last Pain:  Vitals:   11/19/19 0821  TempSrc: Oral  PainSc:       Patients Stated Pain Goal: 3 (14/44/58 4835)  Complications: No apparent anesthesia complications

## 2019-11-19 NOTE — Progress Notes (Signed)
Small pill capsule swallowed per protocol.  Patient tolerated well.  Education given to patient and Surveyor, mining.  Verbalized understanding

## 2019-11-19 NOTE — Progress Notes (Signed)
Colonoscopy shows old blood throughout the colon, and suggests spontaneous minimal bleeding, associated with mucosal friability,  in the region of cecum.  (Please see dictated report.)  PLAN:   1. Proceed w/ capsule endoscopy as planned, to look for similar problems in the small bowel. 2. Depending on patient's capsule endoscopy results, consider doing a repeat prepped colon in a few days to more clearly visualize the area of today's bleeding, and to try to discern the underlying process (?mucosal inflammation/ulceration/ischemia etc). 3. Will keep pt on clr liq diet and do Miralax as advanced prep for anticipated colon in a couple of days 4. Will obtain KUB to localize clips/bleeding site (which I THINK was in the cecum, but landmarks were obscured by poor prep).  Cleotis Nipper, M.D. Pager [phone] If no answer or after 5 PM call [phone]

## 2019-11-19 NOTE — Interval H&P Note (Signed)
History and Physical Interval Note:  11/19/2019 8:25 AM  Sue [name]  has presented today for surgery, with the diagnosis of Hematochezia and anemia.  The various methods of treatment have been discussed with the patient. After consideration of risks, benefits and other options for treatment, the patient has consented to  Procedure(s) with comments: COLONOSCOPY WITH PROPOFOL (N/A) - Unprepped GIVENS CAPSULE STUDY (N/A) - To be performed immediately following colonoscopy as a surgical intervention.  The patient's history has been reviewed, patient examined, stable for surgery.  I have reviewed the patient's chart and labs.  Questions were answered to the patient's satisfaction.     Sue [name]

## 2019-11-19 NOTE — Anesthesia Postprocedure Evaluation (Signed)
Anesthesia Post Note  Patient: Sue [name]  Procedure(s) Performed: COLONOSCOPY WITH PROPOFOL (N/A ) GIVENS CAPSULE STUDY (N/A ) HEMOSTASIS CLIP PLACEMENT SUBMUCOSAL TATTOO INJECTION     Patient location during evaluation: PACU Anesthesia Type: MAC Level of consciousness: awake and alert Pain management: pain level controlled Vital Signs Assessment: post-procedure vital signs reviewed and stable Respiratory status: spontaneous breathing, nonlabored ventilation and respiratory function stable Cardiovascular status: blood pressure returned to baseline and stable Postop Assessment: no apparent nausea or vomiting Anesthetic complications: no    Last Vitals:  Vitals:   11/19/19 0937 11/19/19 0945  BP: (!) 126/98   Pulse:  92  Resp: (!) 28 (!) 28  Temp: 36.9 C   SpO2: 100% 100%    Last Pain:  Vitals:   11/19/19 0937  TempSrc: Axillary  PainSc: 0-No pain                 Pervis Hocking

## 2019-11-20 ENCOUNTER — Encounter: Payer: Self-pay | Admitting: *Deleted

## 2019-11-20 ENCOUNTER — Inpatient Hospital Stay (HOSPITAL_COMMUNITY): Payer: Medicare HMO

## 2019-11-20 LAB — CBC
HCT: 27.2 % — ABNORMAL LOW (ref 36.0–46.0)
Hemoglobin: 8.5 g/dL — ABNORMAL LOW (ref 12.0–15.0)
MCH: 29.5 pg (ref 26.0–34.0)
MCHC: 31.3 g/dL (ref 30.0–36.0)
MCV: 94.4 fL (ref 80.0–100.0)
Platelets: 158 10*3/uL (ref 150–400)
RBC: 2.88 MIL/uL — ABNORMAL LOW (ref 3.87–5.11)
RDW: 16.2 % — ABNORMAL HIGH (ref 11.5–15.5)
WBC: 9.7 10*3/uL (ref 4.0–10.5)
nRBC: 0.5 % — ABNORMAL HIGH (ref 0.0–0.2)

## 2019-11-20 LAB — MAGNESIUM: Magnesium: 2 mg/dL (ref 1.7–2.4)

## 2019-11-20 LAB — BPAM RBC
Blood Product Expiration Date: 202104032359
Blood Product Expiration Date: 202104032359
Blood Product Expiration Date: 202104042359
Blood Product Expiration Date: 202104072359
ISSUE DATE / TIME: 202103061650
ISSUE DATE / TIME: 202103071434
ISSUE DATE / TIME: 202103090819
ISSUE DATE / TIME: 202103091728
Unit Type and Rh: 5100
Unit Type and Rh: 5100
Unit Type and Rh: 5100
Unit Type and Rh: 5100

## 2019-11-20 LAB — BASIC METABOLIC PANEL
Anion gap: 6 (ref 5–15)
BUN: 20 mg/dL (ref 8–23)
CO2: 21 mmol/L — ABNORMAL LOW (ref 22–32)
Calcium: 7.8 mg/dL — ABNORMAL LOW (ref 8.9–10.3)
Chloride: 110 mmol/L (ref 98–111)
Creatinine, Ser: 1.12 mg/dL — ABNORMAL HIGH (ref 0.44–1.00)
GFR calc Af Amer: 59 mL/min — ABNORMAL LOW (ref >60)
GFR calc non Af Amer: 51 mL/min — ABNORMAL LOW (ref >60)
Glucose, Bld: 140 mg/dL — ABNORMAL HIGH (ref 70–99)
Potassium: 3.9 mmol/L (ref 3.5–5.1)
Sodium: 137 mmol/L (ref 135–145)

## 2019-11-20 LAB — TYPE AND SCREEN
ABO/RH(D): O POS
Antibody Screen: NEGATIVE
Unit division: 0
Unit division: 0
Unit division: 0
Unit division: 0

## 2019-11-20 LAB — PHOSPHORUS: Phosphorus: 1.9 mg/dL — ABNORMAL LOW (ref 2.5–4.6)

## 2019-11-20 LAB — GLUCOSE, CAPILLARY
Glucose-Capillary: 138 mg/dL — ABNORMAL HIGH (ref 70–99)
Glucose-Capillary: 148 mg/dL — ABNORMAL HIGH (ref 70–99)
Glucose-Capillary: 178 mg/dL — ABNORMAL HIGH (ref 70–99)
Glucose-Capillary: 140 mg/dL — ABNORMAL HIGH (ref 70–99)

## 2019-11-20 MED ORDER — PEG 3350-KCL-NA BICARB-NACL 420 G PO SOLR
4000.0000 mL | Freq: Once | ORAL | Status: AC
  Administered 2019-11-21: 4000 mL via ORAL

## 2019-11-20 MED ORDER — POLYETHYLENE GLYCOL 3350 17 G PO PACK
17.0000 g | PACK | Freq: Two times a day (BID) | ORAL | Status: AC
  Administered 2019-11-20 (×2): 17 g via ORAL
  Filled 2019-11-20: qty 1

## 2019-11-20 MED ORDER — ALBUTEROL SULFATE (2.5 MG/3ML) 0.083% IN NEBU: 2.5000 mg | INHALATION_SOLUTION | Freq: Four times a day (QID) | RESPIRATORY_TRACT | Status: DC | PRN

## 2019-11-20 NOTE — Progress Notes (Signed)
Patient has two black red colored Stools with noticeable clots on day shift

## 2019-11-20 NOTE — Progress Notes (Signed)
Patient's hemoglobin is holding well following 2 unit transfusion yesterday.  Has had 1 bloody bowel movement (on MiraLAX), but it was noted that there is a large amount of old blood in her colon on yesterday's colonoscopy, therefore, we can assume the patient will have several more bloody bowel movements even if she is not having any further bleeding.  KUB shows clips to be in the proximal colon, as was felt to be the case endoscopically.  I have put the patient on for colonoscopy (with a standard prep) on Friday to carefully inspect the area of apparent bleeding noted on yesterday's colonoscopy.  I want to make sure that there is no neoplastic lesion there.  In the meantime, I hope to have the patient's capsule endoscopy, which was completed this morning, read by that time.  Cleotis Nipper, M.D. Pager [phone] If no answer or after 5 PM call [phone]

## 2019-11-20 NOTE — Progress Notes (Signed)
PROGRESS NOTE  Sue [name] DQQ:[id number] DOB: 1951/04/23 DOA: 11/13/2019 PCP: Ann Held, DO   LOS: 7 days   Brief Narrative / Interim history: 69 year old female with history of DM 2, HTN, COPD on chronic 3 L nasal cannula at home, morbid obesity, who was admitted to the hospital with bright red blood per rectum for the past day.  She had a similar episode last December for which she was hospitalized, colonoscopy at that time did not reveal any source but did show diverticulosis.  [facility] consulted, s/p unprepped colonoscopy with potential source of bleeding in cecum, completed capsule endoscopy 3/10, plan for prepped colonoscopy on 3/12.  Subjective / 24h Interval events: Patient seen this morning.  Indicated that she lives alone.  Uses a walker as needed.  On home oxygen 3 L/min.  Reports dark stools this morning and as per discussion with Dr. Cristina Gong, this is expected given findings on colonoscopy.  Assessment & Plan: Principal Problem Lower GI bleed with acute blood loss anemia -S/p 8 units PRBC transfusion thus far.  Hemoglobin stable in the mid 8 g range compared to yesterday.  She presented with hemoglobin of 5.5. -S/p unprepped colonoscopy by Dr. Cristina Gong which showed potential source of bleeding in the cecal/appendiceal region but no clear cause identified, no AVM/polyps.  Tattooed and clipped with resolution of bleeding.  Patient does take several ibuprofens a week and this potentially may be NSAID induced cecal bleeding -Completed capsule endoscopy 3/10.  Results pending.  I discussed with Dr. Janeece Riggers who plans a prep colonoscopy on 3/12. -Continue to monitor H&H, transfuse with goal hemoglobin greater than 7 -General surgery also consulted and are following peripherally  Thrombocytopenia: May have been related to acute blood loss anemia.  Seems to have resolved today.  Continue to follow CBCs.  Active Problems Chronic hypoxic respiratory failure -She is at her  baseline oxygen with 3 L, satting upper 90s.  CTA of the chest negative for PE. -Respiratory status stable, tolerated colonoscopy well  Essential hypertension -Continue metoprolol, blood pressure acceptable  DM2 -Continue Lantus and sliding scale, CBGs acceptable  CBG (last 3)  Recent Labs    11/20/19 0755 11/20/19 1124 11/20/19 1617  GLUCAP 138* 148* 178*    CKD stage IIIb -Baseline creatinine 1.3-1.8, creatinine at baseline  Morbid obesity -Based on a BMI of 47, will benefit from weight loss  Chronic diastolic CHF -Appears euvolemic  Scheduled Meds: . sodium chloride   Intravenous Once  . sodium chloride   Intravenous Once  . sodium chloride   Intravenous Once  . sodium chloride   Intravenous Once  . sodium chloride   Intravenous Once  . busPIRone  15 mg Oral Daily  . insulin aspart  0-15 Units Subcutaneous TID WC  . insulin aspart  0-5 Units Subcutaneous QHS  . insulin glargine  20 Units Subcutaneous Daily  . levothyroxine  50 mcg Oral Daily  . mouth rinse  15 mL Mouth Rinse BID  . metoprolol succinate  50 mg Oral Daily  . polyethylene glycol  17 g Oral BID  . [START ON 11/21/2019] polyethylene glycol-electrolytes  4,000 mL Oral Once  . QUEtiapine  100 mg Oral QHS  . sodium chloride flush  10-40 mL Intracatheter Q12H  . zolpidem  5 mg Oral QHS   Continuous Infusions:  PRN Meds:.acetaminophen **OR** acetaminophen, albuterol, alum & mag hydroxide-simeth, cyclobenzaprine, fluticasone, influenza vaccine adjuvanted, LORazepam, LORazepam, morphine injection, sodium chloride flush  DVT prophylaxis: SCDs Code Status: Full  code Family Communication: Discussed with patient Patient admitted from: Home Anticipated d/c place: Home Barriers to d/c: Ongoing issues with GI bleeding, anemia requiring multiple transfusions, awaiting further evaluation by GI.  Consultants:  Gastroenterology General surgery   Procedures:  Colonoscopy 3/9 Impression:               -  Old blood in the entire examined colon.                           - Friability with spontaneous minimal bleeding in                            what I believe was the cecum/appendiceal orifice                            region. Clips were placed. Tattooed.                           - No specimens collected.                           - It is unclear whether the observed oozing                            accounts for the patient's continuing blood loss,                            and/or whether it is a localized problem or                            indicative of a more generalized process.  Microbiology  None   Antimicrobials: None     Objective: Vitals:   11/19/19 1848 11/19/19 2145 11/20/19 0507 11/20/19 1340  BP:  94/76 111/61 121/69  Pulse: (!) 103 99 (!) 109 (!) 103  Resp: 20 16 20 17   Temp:  99.2 F (37.3 C) 98.2 F (36.8 C) 97.8 F (36.6 C)  TempSrc:  Oral Oral Oral  SpO2:  100% 100% 100%  Weight:      Height:        Intake/Output Summary (Last 24 hours) at 11/20/2019 1902 Last data filed at 11/20/2019 1343 Gross per 24 hour  Intake 1440 ml  Output 705 ml  Net 735 ml   Filed Weights   11/13/19 0330  Weight: 122.5 kg    Examination:  Constitutional: Middle-aged female, moderately built and morbidly obese sitting up comfortably in reclining chair this morning.  Patient interviewed and examined along with female nurse tech in room. Respiratory: Clear to auscultation bilaterally.  No increased work of breathing. Cardiovascular: S1 and S2 heard, RRR.  No JVD, murmurs or pedal edema.  Telemetry personally reviewed: Sinus tachycardia in the 100s. Abdomen: Obese, soft and nontender.  Normal bowel sounds heard. Neurologic: Alert and oriented without focal neurological deficits.   Data Reviewed: I have independently reviewed following labs and imaging studies   CBC: Recent Labs  Lab 11/16/19 0814 11/17/19 1016 11/17/19 2027 11/18/19 0511 11/19/19 0711  11/19/19 2328 11/20/19 0614  WBC 9.4  --   --  9.4 9.1  --  9.7  HGB 7.7*   < >  8.6* 7.7* 6.5* 8.7* 8.5*  HCT 24.7*   < > 27.5* 24.6* 21.2* 26.9* 27.2*  MCV 95.7  --   --  96.9 97.2  --  94.4  PLT 146*  --   --  142* 149*  --  158   < > = values in this interval not displayed.   Basic Metabolic Panel: Recent Labs  Lab 11/14/19 0621 11/18/19 0511 11/19/19 0711 11/20/19 0614  NA 136 138 136 137  K 3.5 4.5 4.1 3.9  CL 104 109 110 110  CO2 25 26 22  21*  GLUCOSE 146* 177* 161* 140*  BUN 25* 31* 29* 20  CREATININE 1.54* 1.31* 1.18* 1.12*  CALCIUM 8.4* 8.1* 7.9* 7.8*  MG  --   --   --  2.0  PHOS  --   --   --  1.9*   Liver Function Tests: No results for input(s): AST, ALT, ALKPHOS, BILITOT, PROT, ALBUMIN in the last 168 hours. Coagulation Profile: No results for input(s): INR, PROTIME in the last 168 hours. HbA1C: No results for input(s): HGBA1C in the last 72 hours. CBG: Recent Labs  Lab 11/19/19 1611 11/19/19 2233 11/20/19 0755 11/20/19 1124 11/20/19 1617  GLUCAP 165* 199* 138* 148* 178*    Recent Results (from the past 240 hour(s))  SARS CORONAVIRUS 2 (TAT 6-24 HRS) Nasopharyngeal Nasopharyngeal Swab     Status: None   Collection Time: 11/13/19  4:13 AM   Specimen: Nasopharyngeal Swab  Result Value Ref Range Status   SARS Coronavirus 2 NEGATIVE NEGATIVE Final    Comment: (NOTE) SARS-CoV-2 target nucleic acids are NOT DETECTED. The SARS-CoV-2 RNA is generally detectable in upper and lower respiratory specimens during the acute phase of infection. Negative results do not preclude SARS-CoV-2 infection, do not rule out co-infections with other pathogens, and should not be used as the sole basis for treatment or other patient management decisions. Negative results must be combined with clinical observations, patient history, and epidemiological information. The expected result is Negative. Fact Sheet for Patients: SugarRoll.be Fact  Sheet for Healthcare Providers: https://www.woods-mathews.com/ This test is not yet approved or cleared by the [place] FDA and  has been authorized for detection and/or diagnosis of SARS-CoV-2 by FDA under an Emergency Use Authorization (EUA). This EUA will remain  in effect (meaning this test can be used) for the duration of the COVID-19 declaration under Section 56 4(b)(1) of the Act, 21 U.S.C. section 360bbb-3(b)(1), unless the authorization is terminated or revoked sooner. Performed at [facility], [place] [address], [place], [place] [address]   MRSA PCR Screening     Status: None   Collection Time: 11/13/19  8:06 AM   Specimen: Nasal Mucosa; Nasopharyngeal  Result Value Ref Range Status   MRSA by PCR NEGATIVE NEGATIVE Final    Comment:        The GeneXpert MRSA Assay (FDA approved for NASAL specimens only), is one component of a comprehensive MRSA colonization surveillance program. It is not intended to diagnose MRSA infection nor to guide or monitor treatment for MRSA infections. Performed at [facility], [place] [address] [place], [place] [address]      Radiology Studies: DG Abd 1 View  Result Date: 11/20/2019 CLINICAL DATA:  Localize hemoclips EXAM: ABDOMEN - 1 VIEW COMPARISON:  CT 11/13/2019 FINDINGS: Clips are seen in the right lower quadrant, likely in the proximal ascending colon. Separate radiopaque density projects a new Shelly in the left lower quadrant, thin in the  central pelvis on repeat imaging. This may reflect ingested capsule. No organomegaly or evidence of bowel obstruction. No free air. IMPRESSION: Hemo clips seen in the right lower quadrant, likely proximal ascending colon. Electronically Signed   By: Rolm Baptise M.D.   On: 11/20/2019 09:26   Vernell Leep, MD, [place], [facility]. [facility]  To contact the attending provider between 7A-7P or the covering provider during after hours 7P-7A, please log into the web  site [email or web address] and access using universal [place] password for that web site. If you do not have the password, please call the hospital operator.

## 2019-11-21 LAB — CBC
HCT: 22.5 % — ABNORMAL LOW (ref 36.0–46.0)
Hemoglobin: 7 g/dL — ABNORMAL LOW (ref 12.0–15.0)
MCH: 29.5 pg (ref 26.0–34.0)
MCHC: 31.1 g/dL (ref 30.0–36.0)
MCV: 94.9 fL (ref 80.0–100.0)
Platelets: 165 10*3/uL (ref 150–400)
RBC: 2.37 MIL/uL — ABNORMAL LOW (ref 3.87–5.11)
RDW: 16.5 % — ABNORMAL HIGH (ref 11.5–15.5)
WBC: 9.7 10*3/uL (ref 4.0–10.5)
nRBC: 0.7 % — ABNORMAL HIGH (ref 0.0–0.2)

## 2019-11-21 LAB — CBC WITH DIFFERENTIAL/PLATELET
Abs Immature Granulocytes: 0.05 10*3/uL (ref 0.00–0.07)
Basophils Absolute: 0 10*3/uL (ref 0.0–0.1)
Basophils Relative: 0 %
Eosinophils Absolute: 0.4 10*3/uL (ref 0.0–0.5)
Eosinophils Relative: 4 %
HCT: 28.6 % — ABNORMAL LOW (ref 36.0–46.0)
Hemoglobin: 8.7 g/dL — ABNORMAL LOW (ref 12.0–15.0)
Immature Granulocytes: 1 %
Lymphocytes Relative: 30 %
Lymphs Abs: 2.9 10*3/uL (ref 0.7–4.0)
MCH: 28.9 pg (ref 26.0–34.0)
MCHC: 30.4 g/dL (ref 30.0–36.0)
MCV: 95 fL (ref 80.0–100.0)
Monocytes Absolute: 0.8 10*3/uL (ref 0.1–1.0)
Monocytes Relative: 8 %
Neutro Abs: 5.5 10*3/uL (ref 1.7–7.7)
Neutrophils Relative %: 57 %
Platelets: 187 10*3/uL (ref 150–400)
RBC: 3.01 MIL/uL — ABNORMAL LOW (ref 3.87–5.11)
RDW: 18 % — ABNORMAL HIGH (ref 11.5–15.5)
WBC: 9.6 10*3/uL (ref 4.0–10.5)
nRBC: 0.8 % — ABNORMAL HIGH (ref 0.0–0.2)

## 2019-11-21 LAB — BASIC METABOLIC PANEL
Anion gap: 5 (ref 5–15)
BUN: 21 mg/dL (ref 8–23)
CO2: 24 mmol/L (ref 22–32)
Calcium: 8.3 mg/dL — ABNORMAL LOW (ref 8.9–10.3)
Chloride: 108 mmol/L (ref 98–111)
Creatinine, Ser: 1.08 mg/dL — ABNORMAL HIGH (ref 0.44–1.00)
GFR calc Af Amer: >60 mL/min (ref >60)
GFR calc non Af Amer: 53 mL/min — ABNORMAL LOW (ref >60)
Glucose, Bld: 149 mg/dL — ABNORMAL HIGH (ref 70–99)
Potassium: 4.5 mmol/L (ref 3.5–5.1)
Sodium: 137 mmol/L (ref 135–145)

## 2019-11-21 LAB — GLUCOSE, CAPILLARY
Glucose-Capillary: 125 mg/dL — ABNORMAL HIGH (ref 70–99)
Glucose-Capillary: 144 mg/dL — ABNORMAL HIGH (ref 70–99)
Glucose-Capillary: 123 mg/dL — ABNORMAL HIGH (ref 70–99)
Glucose-Capillary: 145 mg/dL — ABNORMAL HIGH (ref 70–99)

## 2019-11-21 LAB — PREPARE RBC (CROSSMATCH)

## 2019-11-21 MED ORDER — SODIUM CHLORIDE 0.9% IV SOLUTION: Freq: Once | INTRAVENOUS | Status: DC

## 2019-11-21 NOTE — Progress Notes (Signed)
The patient had a couple of bloody bowel movements yesterday but, despite being on MiraLAX, has not had any further bowel movements for over 12 hours.    However, the patient has dropped her hemoglobin from 8.5 yesterday to a level of 7.0 this morning, in association with passage of a couple of dark red stools.  Transfusion of 1 unit of packed cells has been ordered.  The bloody stools are somewhat difficult to interpret, since the patient was noted to have a large amount of adherent, old blood coating her entire colon 2 days ago when she underwent unprepped colonoscopy, and since then, she has been on MiraLAX which presumably would purge out that old blood.  Capsule endoscopy, obtained the day before yesterday, has been reviewed, and appears to be negative for a source of significant bleeding.  There was no blood in the intestinal lumen during the 12 hours of monitoring.  Although multiple (approximately 10 or 20) tiny red spots were noted in the small bowel, primarily the proximal small bowel (possibly representing vascular ectasia, or focal inflammatory areas from NSAID exposure), none of these appeared to be bleeding and none of them seemed likely to account for a significant drop in hemoglobin as has occurred overnight.  These findings were reviewed with the patient, and her daughter who is at the bedside.  The patient does not appear to be bleeding briskly, so I do not think a bleeding scan is worth doing.  Instead, we will proceed with our prior plan for a prepped colonoscopy tomorrow.  Cleotis Nipper, M.D. Pager [phone] If no answer or after 5 PM call [phone]

## 2019-11-21 NOTE — Progress Notes (Addendum)
PROGRESS NOTE  [name] EVO:[id number] DOB: 11/27/1950 DOA: 11/13/2019 PCP: Ann Held, DO   LOS: 8 days   Brief Narrative / Interim history: 69 year old female with history of DM 2, HTN, COPD on chronic 3 L nasal cannula at home, morbid obesity, who was admitted to the hospital with bright red blood per rectum for the past day.  She had a similar episode last December for which she was hospitalized, colonoscopy at that time did not reveal any source but did show diverticulosis.  [facility] consulted, s/p unprepped colonoscopy with potential source of bleeding in cecum, completed capsule endoscopy 3/10, plan for prepped colonoscopy on 3/12.  Over the course of her hospitalization, she has required multiple PRBC transfusions due to recurrent drop in her hemoglobin from ongoing GI bleed.  Completed 8 units PRBC thus far and getting ninth unit on 3/11.  Subjective / 24h Interval events: Reports that she had overall 2 episodes of dark tarry stools along with maroon stools in the last 24 hours and the last one was yesterday afternoon.  Denies any other complaints.  Denies dizziness, lightheadedness, chest pain, dyspnea or palpitations.  Agreeable to an additional unit of PRBC today.  Assessment & Plan: Principal Problem Lower GI bleed with acute blood loss anemia -S/p 8 units PRBC transfusion thus far (not all at once, intermittently over the course of this admission).  She presented with hemoglobin of 5.5. -S/p unprepped colonoscopy by Dr. Cristina Gong which showed potential source of bleeding in the cecal/appendiceal region but no clear cause identified, no AVM/polyps.  Tattooed and clipped with resolution of bleeding.  Patient does take several ibuprofens a week and this potentially may be NSAID induced cecal bleeding -Completed capsule endoscopy 3/10.  Results pending.  I discussed with Dr. Cristina Gong on 3/10 who plans a prep colonoscopy on 3/12.  Patient has started colonoscopy prep  today. -General surgery also consulted and are following peripherally -Hemoglobin again has dropped from mid 8 g range to 7 this morning.  Transfuse an additional unit (ninth unit overall).  Follow CBC closely and keep hemoglobin over 7 g per DL.  Thrombocytopenia: May have been related to acute blood loss anemia.  Resolved  Active Problems Chronic hypoxic respiratory failure -She is at her baseline oxygen with 3 L, satting upper 90s.  CTA of the chest negative for PE. -Respiratory status stable, tolerated colonoscopy well.  Patient is on her baseline home oxygen at this time  Essential hypertension -Continue metoprolol.  Controlled.  DM2 -Continue Lantus and sliding scale.  Reasonable inpatient control.  CBG (last 3)  Recent Labs    11/20/19 1617 11/20/19 2147 11/21/19 0748  GLUCAP 178* 140* 125*    CKD stage IIIb -Baseline creatinine 1.3-1.8.  Creatinine normal/1.08.  Morbid obesity -Based on a BMI of 47, will benefit from weight loss  Chronic diastolic CHF -Appears euvolemic except for trace ankle edema.  Scheduled Meds: . sodium chloride   Intravenous Once  . sodium chloride   Intravenous Once  . busPIRone  15 mg Oral Daily  . insulin aspart  0-15 Units Subcutaneous TID WC  . insulin aspart  0-5 Units Subcutaneous QHS  . insulin glargine  20 Units Subcutaneous Daily  . levothyroxine  50 mcg Oral Daily  . mouth rinse  15 mL Mouth Rinse BID  . metoprolol succinate  50 mg Oral Daily  . QUEtiapine  100 mg Oral QHS  . sodium chloride flush  10-40 mL Intracatheter Q12H  . zolpidem  5 mg Oral QHS   Continuous Infusions:  PRN Meds:.acetaminophen **OR** acetaminophen, albuterol, alum & mag hydroxide-simeth, cyclobenzaprine, fluticasone, influenza vaccine adjuvanted, LORazepam, LORazepam, morphine injection, sodium chloride flush  DVT prophylaxis: SCDs Code Status: Full code Family Communication: Discussed with patient.  I discussed in detail with patient's  sister who is a PACU nurse and [place], [place], updated care and answered questions.  She was appreciative of the call.   Patient admitted from: Home Anticipated d/c place: Home Barriers to d/c: Ongoing issues with GI bleeding, anemia requiring multiple transfusions, awaiting further evaluation by GI.  Consultants:  Gastroenterology General surgery   Procedures:  Colonoscopy 3/9 Impression:               - Old blood in the entire examined colon.                           - Friability with spontaneous minimal bleeding in                            what I believe was the cecum/appendiceal orifice                            region. Clips were placed. Tattooed.                           - No specimens collected.                           - It is unclear whether the observed oozing                            accounts for the patient's continuing blood loss,                            and/or whether it is a localized problem or                            indicative of a more generalized process.  Microbiology  None   Antimicrobials: None     Objective: Vitals:   11/20/19 1340 11/20/19 2148 11/21/19 0515 11/21/19 0956  BP: 121/69 105/61 119/68 120/62  Pulse: (!) 103 89 92   Resp: 17 (!) 21 20   Temp: 97.8 F (36.6 C) 98 F (36.7 C) 98.6 F (37 C)   TempSrc: Oral Oral Oral   SpO2: 100% 100% 100%   Weight:      Height:        Intake/Output Summary (Last 24 hours) at 11/21/2019 1139 Last data filed at 11/20/2019 1343 Gross per 24 hour  Intake 240 ml  Output 5 ml  Net 235 ml   Filed Weights   11/13/19 0330  Weight: 122.5 kg    Examination:  Constitutional: Middle-aged female, moderately built and morbidly obese lying comfortably propped up in bed.  Patient in no obvious discomfort. Respiratory: Clear to auscultation bilaterally without wheezing, rhonchi or crackles.  No increased work of breathing. Cardiovascular: S1 and S2 heard, RRR.  No JVD, murmurs or pedal edema.   Telemetry personally reviewed: Sinus tachycardia in the 100s. Abdomen: Obese but soft and nontender.  No organomegaly  or masses appreciated.  Normal bowel sounds heard. Neurologic: Alert and oriented without focal neurological deficits. Extremities: Trace bilateral ankle edema.   Data Reviewed: I have independently reviewed following labs and imaging studies   CBC: Recent Labs  Lab 11/16/19 0814 11/17/19 1016 11/18/19 0511 11/19/19 0711 11/19/19 2328 11/20/19 0614 11/21/19 0457  WBC 9.4  --  9.4 9.1  --  9.7 9.7  HGB 7.7*   < > 7.7* 6.5* 8.7* 8.5* 7.0*  HCT 24.7*   < > 24.6* 21.2* 26.9* 27.2* 22.5*  MCV 95.7  --  96.9 97.2  --  94.4 94.9  PLT 146*  --  142* 149*  --  158 165   < > = values in this interval not displayed.   Basic Metabolic Panel: Recent Labs  Lab 11/18/19 0511 11/19/19 0711 11/20/19 0614 11/21/19 0457  NA 138 136 137 137  K 4.5 4.1 3.9 4.5  CL 109 110 110 108  CO2 26 22 21* 24  GLUCOSE 177* 161* 140* 149*  BUN 31* 29* 20 21  CREATININE 1.31* 1.18* 1.12* 1.08*  CALCIUM 8.1* 7.9* 7.8* 8.3*  MG  --   --  2.0  --   PHOS  --   --  1.9*  --    Liver Function Tests: No results for input(s): AST, ALT, ALKPHOS, BILITOT, PROT, ALBUMIN in the last 168 hours. Coagulation Profile: No results for input(s): INR, PROTIME in the last 168 hours. HbA1C: No results for input(s): HGBA1C in the last 72 hours. CBG: Recent Labs  Lab 11/20/19 0755 11/20/19 1124 11/20/19 1617 11/20/19 2147 11/21/19 0748  GLUCAP 138* 148* 178* 140* 125*    Recent Results (from the past 240 hour(s))  SARS CORONAVIRUS 2 (TAT 6-24 HRS) Nasopharyngeal Nasopharyngeal Swab     Status: None   Collection Time: 11/13/19  4:13 AM   Specimen: Nasopharyngeal Swab  Result Value Ref Range Status   SARS Coronavirus 2 NEGATIVE NEGATIVE Final    Comment: (NOTE) SARS-CoV-2 target nucleic acids are NOT DETECTED. The SARS-CoV-2 RNA is generally detectable in upper and lower respiratory specimens  during the acute phase of infection. Negative results do not preclude SARS-CoV-2 infection, do not rule out co-infections with other pathogens, and should not be used as the sole basis for treatment or other patient management decisions. Negative results must be combined with clinical observations, patient history, and epidemiological information. The expected result is Negative. Fact Sheet for Patients: SugarRoll.be Fact Sheet for Healthcare Providers: https://www.woods-mathews.com/ This test is not yet approved or cleared by the [place] FDA and  has been authorized for detection and/or diagnosis of SARS-CoV-2 by FDA under an Emergency Use Authorization (EUA). This EUA will remain  in effect (meaning this test can be used) for the duration of the COVID-19 declaration under Section 56 4(b)(1) of the Act, 21 U.S.C. section 360bbb-3(b)(1), unless the authorization is terminated or revoked sooner. Performed at [facility], [place] [address] [place], [place] [address]   MRSA PCR Screening     Status: None   Collection Time: 11/13/19  8:06 AM   Specimen: Nasal Mucosa; Nasopharyngeal  Result Value Ref Range Status   MRSA by PCR NEGATIVE NEGATIVE Final    Comment:        The GeneXpert MRSA Assay (FDA approved for NASAL specimens only), is one component of a comprehensive MRSA colonization surveillance program. It is not intended to diagnose MRSA infection nor to guide or monitor treatment for MRSA infections. Performed  at [facility], [place] [address], [place], [place] [address]      Radiology Studies: No results found. Vernell Leep, MD, [place], [facility]. [facility]  To contact the attending provider between 7A-7P or the covering provider during after hours 7P-7A, please log into the web site [email or web address] and access using universal [place] password for that web site. If you do not have the password,  please call the hospital operator.

## 2019-11-21 NOTE — Anesthesia Preprocedure Evaluation (Addendum)
Anesthesia Evaluation  Patient identified by MRN, date of birth, ID band Patient awake    Reviewed: Allergy & Precautions, NPO status , Patient's Chart, lab work & pertinent test results, reviewed documented beta blocker date and time   History of Anesthesia Complications Negative for: history of anesthetic complications  Airway Mallampati: IV  TM Distance: >3 FB Neck ROM: Full    Dental  (+) Missing,    Pulmonary COPD (3L [place]),  oxygen dependent, former smoker,    Pulmonary exam normal        Cardiovascular hypertension, Pt. on medications and Pt. on home beta blockers Normal cardiovascular exam  TTE 2018: mild LVH, EF 61-47%, grade 1 diastolic  dysfunction   Neuro/Psych Anxiety Depression TIA (2016)CVA    GI/Hepatic GERD  Medicated and Controlled,(+) Hepatitis -, Chematochezia and anemia   Endo/Other  diabetes, Type 2, Oral Hypoglycemic Agents, Insulin DependentHypothyroidism Morbid obesity  Renal/GU Renal InsufficiencyRenal disease  negative genitourinary   Musculoskeletal negative musculoskeletal ROS (+)   Abdominal   Peds  Hematology  (+) anemia , Hgb 7.0   Anesthesia Other Findings Day of surgery medications reviewed with patient.  Reproductive/Obstetrics negative OB ROS                            Anesthesia Physical Anesthesia Plan  ASA: III  Anesthesia Plan: MAC   Post-op Pain Management:    Induction:   PONV Risk Score and Plan: 2 and Treatment may vary due to age or medical condition and Propofol infusion  Airway Management Planned: Natural Airway and Simple Face Mask  Additional Equipment: None  Intra-op Plan:   Post-operative Plan:   Informed Consent: I have reviewed the patients History and Physical, chart, labs and discussed the procedure including the risks, benefits and alternatives for the proposed anesthesia with the patient or authorized representative who  has indicated his/her understanding and acceptance.       Plan Discussed with: CRNA  Anesthesia Plan Comments:        Anesthesia Quick Evaluation

## 2019-11-21 NOTE — Progress Notes (Signed)
Pt drinking NuLytely prep for procedure in the morning. Pt up to bathroom and stool is mushy and noted to be bloody with dark small pieces

## 2019-11-22 ENCOUNTER — Encounter (HOSPITAL_COMMUNITY)
Admission: EM | Disposition: A | Discharge status: Home Health | Payer: Self-pay | Source: Home / Self Care | Attending: Internal Medicine

## 2019-11-22 ENCOUNTER — Inpatient Hospital Stay (HOSPITAL_COMMUNITY): Payer: Medicare HMO | Admitting: Anesthesiology

## 2019-11-22 ENCOUNTER — Encounter (HOSPITAL_COMMUNITY): Payer: Self-pay | Admitting: Internal Medicine

## 2019-11-22 HISTORY — PX: HEMOSTASIS CLIP PLACEMENT: SHX6857

## 2019-11-22 HISTORY — PX: COLONOSCOPY WITH PROPOFOL: SHX5780

## 2019-11-22 LAB — CBC
HCT: 25.1 % — ABNORMAL LOW (ref 36.0–46.0)
HCT: 24 % — ABNORMAL LOW (ref 36.0–46.0)
Hemoglobin: 7.8 g/dL — ABNORMAL LOW (ref 12.0–15.0)
Hemoglobin: 7.7 g/dL — ABNORMAL LOW (ref 12.0–15.0)
MCH: 29 pg (ref 26.0–34.0)
MCH: 28.9 pg (ref 26.0–34.0)
MCHC: 31.1 g/dL (ref 30.0–36.0)
MCHC: 32.1 g/dL (ref 30.0–36.0)
MCV: 93.3 fL (ref 80.0–100.0)
MCV: 90.2 fL (ref 80.0–100.0)
Platelets: 174 10*3/uL (ref 150–400)
Platelets: 194 10*3/uL (ref 150–400)
RBC: 2.69 MIL/uL — ABNORMAL LOW (ref 3.87–5.11)
RBC: 2.66 MIL/uL — ABNORMAL LOW (ref 3.87–5.11)
RDW: 18 % — ABNORMAL HIGH (ref 11.5–15.5)
RDW: 17.7 % — ABNORMAL HIGH (ref 11.5–15.5)
WBC: 10.2 10*3/uL (ref 4.0–10.5)
WBC: 10.2 10*3/uL (ref 4.0–10.5)
nRBC: 0.4 % — ABNORMAL HIGH (ref 0.0–0.2)
nRBC: 0.3 % — ABNORMAL HIGH (ref 0.0–0.2)

## 2019-11-22 LAB — BASIC METABOLIC PANEL
Anion gap: 5 (ref 5–15)
BUN: 16 mg/dL (ref 8–23)
CO2: 23 mmol/L (ref 22–32)
Calcium: 8.1 mg/dL — ABNORMAL LOW (ref 8.9–10.3)
Chloride: 110 mmol/L (ref 98–111)
Creatinine, Ser: 1.04 mg/dL — ABNORMAL HIGH (ref 0.44–1.00)
GFR calc Af Amer: >60 mL/min (ref >60)
GFR calc non Af Amer: 56 mL/min — ABNORMAL LOW (ref >60)
Glucose, Bld: 109 mg/dL — ABNORMAL HIGH (ref 70–99)
Potassium: 4.5 mmol/L (ref 3.5–5.1)
Sodium: 138 mmol/L (ref 135–145)

## 2019-11-22 LAB — GLUCOSE, CAPILLARY
Glucose-Capillary: 111 mg/dL — ABNORMAL HIGH (ref 70–99)
Glucose-Capillary: 128 mg/dL — ABNORMAL HIGH (ref 70–99)
Glucose-Capillary: 133 mg/dL — ABNORMAL HIGH (ref 70–99)

## 2019-11-22 SURGERY — COLONOSCOPY WITH PROPOFOL
Anesthesia: Monitor Anesthesia Care

## 2019-11-22 MED ORDER — PEG 3350-KCL-NA BICARB-NACL 420 G PO SOLR: 4000.0000 mL | Freq: Once | ORAL | Status: DC

## 2019-11-22 MED ORDER — SODIUM CHLORIDE 0.9 % IV SOLN: INTRAVENOUS | Status: DC

## 2019-11-22 MED ORDER — PROPOFOL 1000 MG/100ML IV EMUL
INTRAVENOUS | Status: AC
  Filled 2019-11-22: qty 100

## 2019-11-22 MED ORDER — PROPOFOL 500 MG/50ML IV EMUL
INTRAVENOUS | Status: DC | PRN
  Administered 2019-11-22: 115 ug/kg/min via INTRAVENOUS

## 2019-11-22 MED ORDER — EPHEDRINE SULFATE-NACL 50-0.9 MG/10ML-% IV SOSY
PREFILLED_SYRINGE | INTRAVENOUS | Status: DC | PRN
  Administered 2019-11-22: 10 mg via INTRAVENOUS

## 2019-11-22 MED ORDER — PHENYLEPHRINE 40 MCG/ML (10ML) SYRINGE FOR IV PUSH (FOR BLOOD PRESSURE SUPPORT)
PREFILLED_SYRINGE | INTRAVENOUS | Status: DC | PRN
  Administered 2019-11-22: 160 ug via INTRAVENOUS
  Administered 2019-11-22 (×3): 80 ug via INTRAVENOUS

## 2019-11-22 MED ORDER — LACTATED RINGERS IV SOLN: INTRAVENOUS | Status: DC | PRN

## 2019-11-22 MED ORDER — PEG 3350-KCL-NA BICARB-NACL 420 G PO SOLR
4000.0000 mL | Freq: Once | ORAL | Status: AC
  Administered 2019-11-22: 4000 mL via ORAL

## 2019-11-22 SURGICAL SUPPLY — 22 items

## 2019-11-22 NOTE — Transfer of Care (Signed)
Immediate Anesthesia Transfer of Care Note  Patient: Sue [name]  Procedure(s) Performed: COLONOSCOPY WITH PROPOFOL (N/A ) HEMOSTASIS CLIP PLACEMENT  Patient Location: PACU  Anesthesia Type:MAC  Level of Consciousness: awake, alert  and oriented  Airway & Oxygen Therapy: Patient Spontanous Breathing and Patient connected to face mask oxygen  Post-op Assessment: Report given to RN, Post -op Vital signs reviewed and stable and Patient moving all extremities X 4  Post vital signs: Reviewed and stable  Last Vitals:  Vitals Value Taken Time  BP    Temp    Pulse    Resp    SpO2      Last Pain:  Vitals:   11/22/19 0735  TempSrc: Oral  PainSc: 0-No pain      Patients Stated Pain Goal: 3 (56/43/32 9518)  Complications: No apparent anesthesia complications

## 2019-11-22 NOTE — Progress Notes (Signed)
It is noted that the patient's hemoglobin dropped from 8.7 yesterday to 7.8 today.  However, at the time of her transfusion yesterday morning, it was 7.0, and she received 1 unit, so the current level of 7.8 represents an appropriate posttransfusion rise.  Therefore, it may well be that the patient is not bleeding anymore.  Apparently, toward the end of her prep, the fluid was slightly blood-tinged, suggesting either minimal ongoing bleeding, or slight staining from residual blood.  We will proceed with colonoscopy today as planned.  Cleotis Nipper, M.D. Pager [phone] If no answer or after 5 PM call [phone]

## 2019-11-22 NOTE — Anesthesia Postprocedure Evaluation (Signed)
Anesthesia Post Note  Patient: YAHAIRA BRUSKI  Procedure(s) Performed: COLONOSCOPY WITH PROPOFOL (N/A ) HEMOSTASIS CLIP PLACEMENT     Patient location during evaluation: PACU Anesthesia Type: MAC Level of consciousness: awake and alert and oriented Pain management: pain level controlled Vital Signs Assessment: post-procedure vital signs reviewed and stable Respiratory status: spontaneous breathing, nonlabored ventilation and respiratory function stable Cardiovascular status: blood pressure returned to baseline Postop Assessment: no apparent nausea or vomiting Anesthetic complications: no    Last Vitals:  Vitals:   11/22/19 0938 11/22/19 0950  BP: (!) 144/68 (!) 120/56  Pulse: 93 88  Resp: (!) 27 (!) 22  Temp: (!) 36.2 C   SpO2: 100% 100%    Last Pain:  Vitals:   11/22/19 0938  TempSrc: Temporal  PainSc: 0-No pain                 Brennan Bailey

## 2019-11-22 NOTE — Progress Notes (Signed)
Patient's colonoscopy showed persistent blood and clots throughout the colon, with a small erosion and adherent clot near the site of the place that was clipped 3 days ago, so a clip was placed on this lesion as well.  Optimal visualization could not be obtained.  No active bleeding was noted.  Plan: Repeat colonoscopy tomorrow with further prep.  Hopefully, this will allow definitive inspection for any additional perspective bleeding sites.  Please see dictated procedure note report for more details.  Cleotis Nipper, M.D. Pager [phone] If no answer or after 5 PM call [phone]

## 2019-11-22 NOTE — Op Note (Signed)
[facility] Patient Name: Sue [name] Procedure Date: 11/22/2019 MRN: [id number] Attending MD: Ronald Lobo , MD Date of Birth: 1950-11-17 CSN: [id number] Age: 69 Admit Type: Inpatient Procedure:                Colonoscopy Indications:              Hematochezia, , Acute post hemorrhagic                            anemia--patient with recurrent GI bleeding; capsule                            endoscopy neg;colonoscopy 3 days ago unprepped                            showed old blood throughout colon and active oozing                            from the area above the cecum (?NSAID ulceration),                            clipped. Since then, patient has continued to have                            gradual blood loss. Providers:                Ronald Lobo, MD, Cleda Daub, RN, Corie Chiquito,                            Technician, Maudry Diego, CRNA Referring MD:              Medicines:                Monitored Anesthesia Care Complications:            No immediate complications. Estimated Blood Loss:     Estimated blood loss: none. Procedure:                Pre-Anesthesia Assessment:                           - Prior to the procedure, a History and Physical                            was performed, and patient medications and                            allergies were reviewed. The patient's tolerance of                            previous anesthesia was also reviewed. The risks                            and benefits of the procedure and the sedation                            options and  risks were discussed with the patient.                            All questions were answered, and informed consent                            was obtained. Prior Anticoagulants: The patient has                            taken no previous anticoagulant or antiplatelet                            agents. ASA Grade Assessment: III - A patient with   severe systemic disease. After reviewing the risks                            and benefits, the patient was deemed in                            satisfactory condition to undergo the procedure.                           After obtaining informed consent, the colonoscope                            was passed under direct vision. Throughout the                            procedure, the patient's blood pressure, pulse, and                            oxygen saturations were monitored continuously. The                            CF-HQ190L ([id number] Olympus colonoscope was                            introduced through the anus and advanced to the the                            cecum, identified by appendiceal orifice and                            ileocecal valve. The [id number]-[id number] ([id number] Olympus                            pediatric colonscope was introduced through the and                            advanced to the. The colonoscopy was performed                            without difficulty. The patient tolerated the  procedure well. The quality of the bowel                            preparation was fair. Scope In: 8:44:26 AM Scope Out: 9:28:56 AM Scope Withdrawal Time: 0 hours 39 minutes 58 seconds  Total Procedure Duration: 0 hours 44 minutes 30 seconds  Findings:      The perianal and digital rectal examinations were normal.      A single localized non-bleeding erosion was found in the proximal       ascending colon. Stigma of recent bleeding (adherent blood) was present.       For hemostasis, one hemostatic clip was successfully placed. There was       no bleeding at the end of the procedure.      No active bleeding or fresh blood was noted during this exam.      However, there was old blood, and some clots, throughout the entire       colon, including the cecum. Prolonged efforts were made to enter the       terminal ileum, including switching to the pediatric  scope; a brief       glimpse of the TI was obtained with the adult scope and no blood or clot       were observed in it, but I never achieved a good controlled look or       intubation of the TI.      Prolonged efforts to irrigate off the old blood to expose the mucosa of       the proximal colon and look for additional lesions met with limited       success; no additional lesions were seen, but much of the mucosa       remained obscured by a film of old blood, some clots, and particulate       debris.      The exam was otherwise grossly normal throughout the examined colon. Impression:               - Preparation of the colon was fair.                           - No active bleeding seen.                           - A single erosion with adherent blood was seen in                            the proximal ascending colon. Clip was placed.                           - No specimens collected. Moderate Sedation:      This patient was sedated with monitored anesthesia care, not moderate       sedation. Recommendation:           - Repeat colonoscopy tomorrow for retreatment. Procedure Code(s):        --- Professional ---                           [phone], Colonoscopy, flexible; with control of  bleeding, any method Diagnosis Code(s):        --- Professional ---                           K63.3, Ulcer of intestine                           K92.1, Melena (includes Hematochezia)                           D62, Acute posthemorrhagic anemia CPT copyright 2019 American Medical Association. All rights reserved. The codes documented in this report are preliminary and upon coder review may  be revised to meet current compliance requirements. Ronald Lobo, MD 11/22/2019 9:49:28 AM This report has been signed electronically. Number of Addenda: 0

## 2019-11-22 NOTE — Anesthesia Preprocedure Evaluation (Addendum)
Anesthesia Evaluation  Patient identified by MRN, date of birth, ID band Patient awake    Reviewed: Allergy & Precautions, NPO status , Patient's Chart, lab work & pertinent test results  Airway Mallampati: II  TM Distance: >3 FB     Dental   Pulmonary former smoker,    breath sounds clear to auscultation       Cardiovascular hypertension, + DOE   Rhythm:Regular Rate:Normal     Neuro/Psych    GI/Hepatic GERD  ,(+) Hepatitis -  Endo/Other  diabetesHypothyroidism   Renal/GU Renal disease     Musculoskeletal   Abdominal   Peds  Hematology  (+) anemia ,   Anesthesia Other Findings   Reproductive/Obstetrics                            Anesthesia Physical Anesthesia Plan  ASA: III  Anesthesia Plan: MAC   Post-op Pain Management:    Induction: Intravenous  PONV Risk Score and Plan: Ondansetron and Propofol infusion  Airway Management Planned: Nasal Cannula and Simple Face Mask  Additional Equipment:   Intra-op Plan:   Post-operative Plan:   Informed Consent: I have reviewed the patients History and Physical, chart, labs and discussed the procedure including the risks, benefits and alternatives for the proposed anesthesia with the patient or authorized representative who has indicated his/her understanding and acceptance.     Dental advisory given  Plan Discussed with: Anesthesiologist, CRNA and Surgeon  Anesthesia Plan Comments:       Anesthesia Quick Evaluation

## 2019-11-22 NOTE — Anesthesia Procedure Notes (Signed)
Procedure Name: MAC Date/Time: 11/22/2019 8:32 AM Performed by: Niel Hummer, CRNA Pre-anesthesia Checklist: Patient identified, Emergency Drugs available, Suction available and Patient being monitored Oxygen Delivery Method: Simple face mask

## 2019-11-22 NOTE — Interval H&P Note (Signed)
History and Physical Interval Note:  11/22/2019 8:10 AM  Sue [name]  has presented today for surgery, with the diagnosis of hematochezia and anemia.  The various methods of treatment have been discussed with the patient and family. After consideration of risks, benefits and other options for treatment, the patient has consented to  Procedure(s): COLONOSCOPY WITH PROPOFOL (N/A) as a surgical intervention.  The patient's history has been reviewed, patient examined, no change in status, stable for surgery.  I have reviewed the patient's chart and labs.  Questions were answered to the patient's satisfaction.     [name] [name]

## 2019-11-22 NOTE — Progress Notes (Signed)
PROGRESS NOTE  Sue [name] AST:[id number] DOB: 06-Feb-1951 DOA: 11/13/2019 PCP: Ann Held, DO   LOS: 9 days   Brief Narrative / Interim history: 69 year old female with history of DM 2, HTN, COPD on chronic 3 L nasal cannula at home, morbid obesity, who was admitted to the hospital with bright red blood per rectum for the past day.  She had a similar episode last December for which she was hospitalized, colonoscopy at that time did not reveal any source but did show diverticulosis.  [facility] consulted, s/p unprepped colonoscopy with potential source of bleeding in cecum, completed capsule endoscopy 3/10, plan for prepped colonoscopy on 3/12.  Over the course of her hospitalization, she has required multiple PRBC transfusions due to recurrent drop in her hemoglobin from ongoing GI bleed.  Completed 9 units PRBC thus far.  Repeat colonoscopy 3/12 showed persistent blood and clot throughout the colon with suboptimal visualization and hence plans for repeat colonoscopy with further preparation on 3/13.  Subjective / 24h Interval events: Patient seen this morning after procedure.  Daughter at bedside.  Discussed results of procedure by GI.  Reports that during bowel prep she had both dark and maroon color watery stools towards the end.  Assessment & Plan: Principal Problem Lower GI bleed with acute blood loss anemia -S/p 9 units PRBC transfusion thus far (not all at once, intermittently over the course of this admission).  She presented with hemoglobin of 5.5. -S/p unprepped colonoscopy by Dr. Cristina Gong which showed potential source of bleeding in the cecal/appendiceal region but no clear cause identified, no AVM/polyps.  Tattooed and clipped with resolution of bleeding.  Patient does take several ibuprofens a week and this potentially may be NSAID induced cecal bleeding -Completed capsule endoscopy 3/10.  Results pending. -General surgery also consulted and are following peripherally -As  per Dr. Cristina Gong "patient's colonoscopy showed persistent blood and clots throughout the colon, with a small erosion and adherent clot near the site of the place that was clipped 3 days ago, so a clip was placed on this lesion as well.  Optimal visualization could not be obtained.  No active bleeding was noted."  Repeat colonoscopy with further prep planned for 3/13. -Hemoglobin trend over the last 24 hours, 7 >1 unit PRBC/8.7 > 7.8.  Follow repeat CBC later this afternoon and daily.  Transfuse if hemoglobin 7 g or less.  Thrombocytopenia: May have been related to acute blood loss anemia.  Resolved  Active Problems Chronic hypoxic respiratory failure - CTA of the chest negative for PE. - Remained stable on her current home level of oxygen 3 L/min.  Essential hypertension -Continue metoprolol.  Controlled.  DM2 -Continue Lantus and sliding scale.  Reasonable inpatient control.  CKD stage IIIb -Baseline creatinine 1.3-1.8.  Creatinine normal/1.04.  Morbid obesity -Based on a BMI of 47, will benefit from weight loss  Chronic diastolic CHF -Appears euvolemic except for trace ankle edema.  Scheduled Meds: . sodium chloride   Intravenous Once  . sodium chloride   Intravenous Once  . busPIRone  15 mg Oral Daily  . insulin aspart  0-15 Units Subcutaneous TID WC  . insulin aspart  0-5 Units Subcutaneous QHS  . insulin glargine  20 Units Subcutaneous Daily  . levothyroxine  50 mcg Oral Daily  . mouth rinse  15 mL Mouth Rinse BID  . metoprolol succinate  50 mg Oral Daily  . QUEtiapine  100 mg Oral QHS  . sodium chloride flush  10-40 mL  Intracatheter Q12H  . zolpidem  5 mg Oral QHS   Continuous Infusions: . sodium chloride     PRN Meds:.acetaminophen **OR** acetaminophen, albuterol, alum & mag hydroxide-simeth, cyclobenzaprine, fluticasone, influenza vaccine adjuvanted, LORazepam, LORazepam, morphine injection, sodium chloride flush  DVT prophylaxis: SCDs Code Status: Full  code Family Communication: Discussed with patient.  I discussed in detail with patient's sister who is a PACU nurse in [place], [place] on 3/11, updated care and answered questions.  She was appreciative of the call.  I discussed in detail with patient's daughter at bedside on 3/12. Patient admitted from: Home Anticipated d/c place: Home Barriers to d/c: Ongoing issues with GI bleeding, anemia requiring multiple transfusions, awaiting further evaluation by GI.  Consultants:  Gastroenterology General surgery   Procedures:  Colonoscopy 3/9 Impression:               - Old blood in the entire examined colon.                           - Friability with spontaneous minimal bleeding in                            what I believe was the cecum/appendiceal orifice                            region. Clips were placed. Tattooed.                           - No specimens collected.                           - It is unclear whether the observed oozing                            accounts for the patient's continuing blood loss,                            and/or whether it is a localized problem or                            indicative of a more generalized process.  Microbiology  None   Antimicrobials: None     Objective: Vitals:   11/22/19 0940 11/22/19 0950 11/22/19 1000 11/22/19 1038  BP: 128/68 (!) 120/56 (!) 112/57 (!) 117/51  Pulse: 92 88 85 86  Resp: (!) 28 (!) 22 19 14   Temp:    (!) 97.4 F (36.3 C)  TempSrc:    Oral  SpO2: 100% 100% 100% 100%  Weight:      Height:        Intake/Output Summary (Last 24 hours) at 11/22/2019 1410 Last data filed at 11/22/2019 4665 Gross per 24 hour  Intake 1592.92 ml  Output 101 ml  Net 1491.92 ml   Filed Weights   11/13/19 0330  Weight: 122.5 kg    Examination:  Constitutional: Middle-aged female, moderately built and morbidly obese lying comfortably propped up in bed without distress. Respiratory: Clear to auscultation.  No wheezing, rhonchi  or crackles.  No increased work of breathing.  On oxygen via nasal cannula at 3 L/min. Cardiovascular: S1 and S2  heard, RRR.  No JVD, murmurs.  Trace bilateral ankle edema.  Telemetry personally reviewed: Sinus rhythm. Abdomen: Abdomen is obese but not distended, soft and nontender.  No organomegaly or masses appreciated.  Normal bowel sounds heard. Neurologic: Alert and oriented without focal neurological deficits. Extremities: Trace bilateral ankle edema.   Data Reviewed: I have independently reviewed following labs and imaging studies   CBC: Recent Labs  Lab 11/19/19 0711 11/19/19 0711 11/19/19 2328 11/20/19 0614 11/21/19 0457 11/21/19 1923 11/22/19 0437  WBC 9.1  --   --  9.7 9.7 9.6 10.2  NEUTROABS  --   --   --   --   --  5.5  --   HGB 6.5*   < > 8.7* 8.5* 7.0* 8.7* 7.8*  HCT 21.2*   < > 26.9* 27.2* 22.5* 28.6* 25.1*  MCV 97.2  --   --  94.4 94.9 95.0 93.3  PLT 149*  --   --  158 165 187 174   < > = values in this interval not displayed.   Basic Metabolic Panel: Recent Labs  Lab 11/18/19 0511 11/19/19 0711 11/20/19 0614 11/21/19 0457 11/22/19 0437  NA 138 136 137 137 138  K 4.5 4.1 3.9 4.5 4.5  CL 109 110 110 108 110  CO2 26 22 21* 24 23  GLUCOSE 177* 161* 140* 149* 109*  BUN 31* 29* 20 21 16   CREATININE 1.31* 1.18* 1.12* 1.08* 1.04*  CALCIUM 8.1* 7.9* 7.8* 8.3* 8.1*  MG  --   --  2.0  --   --   PHOS  --   --  1.9*  --   --    Liver Function Tests: No results for input(s): AST, ALT, ALKPHOS, BILITOT, PROT, ALBUMIN in the last 168 hours. Coagulation Profile: No results for input(s): INR, PROTIME in the last 168 hours. HbA1C: No results for input(s): HGBA1C in the last 72 hours. CBG: Recent Labs  Lab 11/21/19 0748 11/21/19 1158 11/21/19 1759 11/21/19 2123 11/22/19 1032  GLUCAP 125* 144* 123* 145* 111*    Recent Results (from the past 240 hour(s))  SARS CORONAVIRUS 2 (TAT 6-24 HRS) Nasopharyngeal Nasopharyngeal Swab     Status: None   Collection  Time: 11/13/19  4:13 AM   Specimen: Nasopharyngeal Swab  Result Value Ref Range Status   SARS Coronavirus 2 NEGATIVE NEGATIVE Final    Comment: (NOTE) SARS-CoV-2 target nucleic acids are NOT DETECTED. The SARS-CoV-2 RNA is generally detectable in upper and lower respiratory specimens during the acute phase of infection. Negative results do not preclude SARS-CoV-2 infection, do not rule out co-infections with other pathogens, and should not be used as the sole basis for treatment or other patient management decisions. Negative results must be combined with clinical observations, patient history, and epidemiological information. The expected result is Negative. Fact Sheet for Patients: SugarRoll.be Fact Sheet for Healthcare Providers: https://www.woods-mathews.com/ This test is not yet approved or cleared by the [place] FDA and  has been authorized for detection and/or diagnosis of SARS-CoV-2 by FDA under an Emergency Use Authorization (EUA). This EUA will remain  in effect (meaning this test can be used) for the duration of the COVID-19 declaration under Section 56 4(b)(1) of the Act, 21 U.S.C. section 360bbb-3(b)(1), unless the authorization is terminated or revoked sooner. Performed at [facility], [place] [address] [place], [place] [address]   MRSA PCR Screening     Status: None   Collection Time: 11/13/19  8:06 AM  Specimen: Nasal Mucosa; Nasopharyngeal  Result Value Ref Range Status   MRSA by PCR NEGATIVE NEGATIVE Final    Comment:        The GeneXpert MRSA Assay (FDA approved for NASAL specimens only), is one component of a comprehensive MRSA colonization surveillance program. It is not intended to diagnose MRSA infection nor to guide or monitor treatment for MRSA infections. Performed at [facility], [place] [address], [place], [place] [address]      Radiology Studies: No results found. Vernell Leep, MD, [place], [facility]. [facility]  To contact the attending provider between 7A-7P or the covering provider during after hours 7P-7A, please log into the web site [email or web address] and access using universal [place] password for that web site. If you do not have the password, please call the hospital operator.

## 2019-11-23 ENCOUNTER — Inpatient Hospital Stay (HOSPITAL_COMMUNITY): Payer: Medicare HMO | Admitting: Anesthesiology

## 2019-11-23 ENCOUNTER — Encounter (HOSPITAL_COMMUNITY)
Admission: EM | Disposition: A | Discharge status: Home Health | Payer: Self-pay | Source: Home / Self Care | Attending: Internal Medicine

## 2019-11-23 ENCOUNTER — Inpatient Hospital Stay (HOSPITAL_COMMUNITY): Payer: Medicare HMO

## 2019-11-23 ENCOUNTER — Encounter (HOSPITAL_COMMUNITY): Payer: Self-pay | Admitting: Internal Medicine

## 2019-11-23 HISTORY — PX: COLONOSCOPY WITH PROPOFOL: SHX5780

## 2019-11-23 LAB — BASIC METABOLIC PANEL
Anion gap: 5 (ref 5–15)
BUN: 12 mg/dL (ref 8–23)
CO2: 24 mmol/L (ref 22–32)
Calcium: 7.8 mg/dL — ABNORMAL LOW (ref 8.9–10.3)
Chloride: 111 mmol/L (ref 98–111)
Creatinine, Ser: 1.11 mg/dL — ABNORMAL HIGH (ref 0.44–1.00)
GFR calc Af Amer: 60 mL/min — ABNORMAL LOW (ref >60)
GFR calc non Af Amer: 51 mL/min — ABNORMAL LOW (ref >60)
Glucose, Bld: 98 mg/dL (ref 70–99)
Potassium: 4.1 mmol/L (ref 3.5–5.1)
Sodium: 140 mmol/L (ref 135–145)

## 2019-11-23 LAB — CBC
HCT: 20.6 % — ABNORMAL LOW (ref 36.0–46.0)
Hemoglobin: 6.4 g/dL — CL (ref 12.0–15.0)
MCH: 29.5 pg (ref 26.0–34.0)
MCHC: 31.1 g/dL (ref 30.0–36.0)
MCV: 94.9 fL (ref 80.0–100.0)
Platelets: 192 10*3/uL (ref 150–400)
RBC: 2.17 MIL/uL — ABNORMAL LOW (ref 3.87–5.11)
RDW: 17.4 % — ABNORMAL HIGH (ref 11.5–15.5)
WBC: 9.7 10*3/uL (ref 4.0–10.5)
nRBC: 0.5 % — ABNORMAL HIGH (ref 0.0–0.2)

## 2019-11-23 LAB — HEMOGLOBIN AND HEMATOCRIT, BLOOD
HCT: 27.4 % — ABNORMAL LOW (ref 36.0–46.0)
Hemoglobin: 8.7 g/dL — ABNORMAL LOW (ref 12.0–15.0)

## 2019-11-23 LAB — GLUCOSE, CAPILLARY
Glucose-Capillary: 81 mg/dL (ref 70–99)
Glucose-Capillary: 99 mg/dL (ref 70–99)
Glucose-Capillary: 86 mg/dL (ref 70–99)
Glucose-Capillary: 85 mg/dL (ref 70–99)

## 2019-11-23 LAB — PROTIME-INR
INR: 1.2 (ref 0.8–1.2)
Prothrombin Time: 14.9 seconds (ref 11.4–15.2)

## 2019-11-23 LAB — PREPARE RBC (CROSSMATCH)

## 2019-11-23 SURGERY — COLONOSCOPY WITH PROPOFOL
Anesthesia: Monitor Anesthesia Care

## 2019-11-23 MED ORDER — SODIUM CHLORIDE 0.9% IV SOLUTION: Freq: Once | INTRAVENOUS | Status: DC

## 2019-11-23 MED ORDER — FUROSEMIDE 10 MG/ML IJ SOLN
20.0000 mg | Freq: Once | INTRAMUSCULAR | Status: AC
  Administered 2019-11-23: 20 mg via INTRAVENOUS
  Filled 2019-11-23: qty 2

## 2019-11-23 MED ORDER — PROPOFOL 500 MG/50ML IV EMUL
INTRAVENOUS | Status: DC | PRN
  Administered 2019-11-23: 70 mg via INTRAVENOUS
  Administered 2019-11-23: 100 ug/kg/min via INTRAVENOUS

## 2019-11-23 MED ORDER — LACTATED RINGERS IV SOLN: INTRAVENOUS | Status: DC | PRN

## 2019-11-23 MED ORDER — TECHNETIUM TC 99M-LABELED RED BLOOD CELLS IV KIT
21.4000 | PACK | Freq: Once | INTRAVENOUS | Status: AC
  Administered 2019-11-23: 21.4 via INTRAVENOUS

## 2019-11-23 SURGICAL SUPPLY — 22 items

## 2019-11-23 NOTE — Anesthesia Postprocedure Evaluation (Signed)
Anesthesia Post Note  Patient: Sue [name]  Procedure(s) Performed: COLONOSCOPY WITH PROPOFOL (N/A )     Patient location during evaluation: Endoscopy Anesthesia Type: MAC Level of consciousness: awake Pain management: pain level controlled Vital Signs Assessment: post-procedure vital signs reviewed and stable Respiratory status: spontaneous breathing Cardiovascular status: stable Postop Assessment: no apparent nausea or vomiting Anesthetic complications: no    Last Vitals:  Vitals:   11/23/19 1123 11/23/19 1230  BP: 140/62 (!) 148/85  Pulse: 96 89  Resp: (!) 21 (!) 27  Temp: 36.8 C   SpO2: 100% 97%    Last Pain:  Vitals:   11/23/19 1230  TempSrc:   PainSc: 0-No pain                 Nawaf Strange

## 2019-11-23 NOTE — Transfer of Care (Signed)
Immediate Anesthesia Transfer of Care Note  Patient: Sue [name]  Procedure(s) Performed: Procedure(s): COLONOSCOPY WITH PROPOFOL (N/A)  Patient Location: PACU and Endoscopy Unit  Anesthesia Type:General  Level of Consciousness: awake, alert  and oriented  Airway & Oxygen Therapy: Patient Spontanous Breathing and Patient connected to nasal cannula oxygen  Post-op Assessment: Report given to RN and Post -op Vital signs reviewed and stable  Post vital signs: Reviewed and stable  Last Vitals:  Vitals:   11/23/19 1123 11/23/19 1230  BP: 140/62 (!) 148/85  Pulse: 96 89  Resp: (!) 21 (!) 27  Temp: 36.8 C   SpO2: 597% 47%    Complications: No apparent anesthesia complications

## 2019-11-23 NOTE — Progress Notes (Signed)
Patient was initially scheduled for a colonoscopy today. Her hemoglobin has dropped from 7.7 yesterday to 6.4 today. Because of her blood type, blood transfusion is currently ordered but not available in house.  Discussed with anesthesiologist and patient's hospitalist Dr. Algis Liming, due to unavailability of blood product currently, I would hold off on colonoscopy, as I will not be able to resuscitate her without availability of PRBC.  Recommend getting a bleeding scan, if positive recommend IR guided embolization. Monitor H&H and transfuse to keep hemoglobin above 7.   Sue Juniper, MD

## 2019-11-23 NOTE — Progress Notes (Signed)
CRITICAL VALUE ALERT  Critical Value:  Hemoglobin 6.4  Date & Time Notied:  11/23/19 @ 2081  Provider Notified:Dr. Khan/Dr. Algis Liming    Orders Received/Actions taken: See order.

## 2019-11-23 NOTE — Progress Notes (Signed)
Patient drank more than half of the colonoscopy prep, output still bright red blood with clots.

## 2019-11-23 NOTE — Op Note (Signed)
[facility] Patient Name: Sue [name] Procedure Date: 11/23/2019 MRN: [id number] Attending MD: [name] , MD Date of Birth: 1950-10-15 CSN: [id number] Age: 69 Admit Type: Inpatient Procedure:                Colonoscopy Indications:              Rectal bleeding Providers:                [name], MD, Grace Isaac, RN, Laverda Sorenson,                            Technician, Eliberto Ivory, CRNA Referring MD:             [facility] Medicines:                Monitored Anesthesia Care Complications:            No immediate complications. Estimated Blood Loss:     Estimated blood loss: none. Procedure:                Pre-Anesthesia Assessment:                           - Prior to the procedure, a History and Physical                            was performed, and patient medications and                            allergies were reviewed. The patient's tolerance of                            previous anesthesia was also reviewed. The risks                            and benefits of the procedure and the sedation                            options and risks were discussed with the patient.                            All questions were answered, and informed consent                            was obtained. Prior Anticoagulants: The patient has                            taken no previous anticoagulant or antiplatelet                            agents. ASA Grade Assessment: III - A patient with                            severe systemic disease. After reviewing the risks  and benefits, the patient was deemed in                            satisfactory condition to undergo the procedure.                           After obtaining informed consent, the colonoscope                            was passed under direct vision. Throughout the                            procedure, the patient's blood pressure, pulse, and                            oxygen  saturations were monitored continuously. The                            [id number]-[id number] ([id number]) Olympus pediatric colonscope                            was introduced through the anus and advanced to the                            the terminal ileum. The colonoscopy was performed                            without difficulty. The patient tolerated the                            procedure well. The quality of the bowel                            preparation was fair. Scope In: 11:58:22 AM Scope Out: 12:22:11 PM Scope Withdrawal Time: 0 hours 18 minutes 43 seconds  Total Procedure Duration: 0 hours 23 minutes 49 seconds  Findings:      The perianal and digital rectal examinations were normal.      Red blood was found in the entire colon.      Two endoclips were noted in ascending colon, next to another endoclip in       the ascending colon.The wall opposite to this area had been tattooed.      The terminal ileum appeared normal. Small amount of old blood was noted       in the terminal ileum, likely from backwash from cecum, on thorough       lavage and deep intubation of terminal ileum, it appeared unremarkable.      After a large volume lavage, there was no evidence of active bleeding in       the entire colon.      Surprisingly, I did not see any diverticulosis, could be due to fair       prep and large amount of old blood.      The retroflexed view of the distal rectum and anal verge was normal and       showed no anal or rectal abnormalities. Impression:               -  Preparation of the colon was fair.                           - Blood in the entire examined colon.                           - The examined portion of the ileum was normal.                           - The distal rectum and anal verge are normal on                            retroflexion view.                           - No specimens collected. Moderate Sedation:      Patient did not receive moderate sedation for this  procedure, but       instead received monitored anesthesia care. Recommendation:           - Clear liquid diet.                           - NM GI bleeding scan for obscure overt GI bleeding. Procedure Code(s):        --- Professional ---                           [phone], Colonoscopy, flexible; diagnostic, including                            collection of specimen(s) by brushing or washing,                            when performed (separate procedure) Diagnosis Code(s):        --- Professional ---                           K92.2, Gastrointestinal hemorrhage, unspecified                           K62.5, Hemorrhage of anus and rectum CPT copyright 2019 American Medical Association. All rights reserved. The codes documented in this report are preliminary and upon coder review may  be revised to meet current compliance requirements. [name], MD 11/23/2019 12:31:32 PM This report has been signed electronically. Number of Addenda: 0

## 2019-11-23 NOTE — H&P (View-Only) (Signed)
Patient was initially scheduled for a colonoscopy today. Her hemoglobin has dropped from 7.7 yesterday to 6.4 today. Because of her blood type, blood transfusion is currently ordered but not available in house.  Discussed with anesthesiologist and patient's hospitalist Dr. Algis Liming, due to unavailability of blood product currently, I would hold off on colonoscopy, as I will not be able to resuscitate her without availability of PRBC.  Recommend getting a bleeding scan, if positive recommend IR guided embolization. Monitor H&H and transfuse to keep hemoglobin above 7.   [name], MD

## 2019-11-23 NOTE — Progress Notes (Addendum)
PROGRESS NOTE  Sue [name] YBO:[id number] DOB: [birth year] DOA: 11/13/2019 PCP: Ann Held, DO   LOS: 10 days   Brief Narrative / Interim history: 69 year old female with history of DM 2, HTN, COPD on chronic 3 L nasal cannula at home, morbid obesity, who was admitted to the hospital with bright red blood per rectum for the past day.  She had a similar episode last December for which she was hospitalized, colonoscopy at that time did not reveal any source but did show diverticulosis.  [facility] consulted, has had multiple colonoscopies this admission which shows ongoing bleeding in the colon without clear identifiable source.  Last colonoscopy today 3/13.  GI bleeding scan ordered. Over the course of her hospitalization, she has required frequent and multiple PRBC transfusions due to recurrent drop in her hemoglobin from ongoing GI bleed.  Completed 9 units PRBC thus far and getting her 10th and 11th unit of PRBC today.    Subjective / 24h Interval events: Patient seen this morning prior to procedure.  Understandably frustrated due to ongoing GI bleeding with inability to definitely treat.  States that with bowel prep initial output would be maroon-colored and then changed to blood-tinged water towards the end.  No abdominal pain, chest pain, dyspnea, lightheadedness or dizziness.  Assessment & Plan: Principal Problem Lower GI bleed with acute blood loss anemia  [facility] consulted and actively assisting.  Patient has undergone multiple colonoscopies this admission which has shown blood in the colon without clear source.  Her last colonoscopy was today 3/13.  I discussed in detail with Dr. Therisa Doyne, has ordered a GI bleeding scan.  Capsule endoscopy done a couple days ago, results to be followed by GI.  Patient presented with hemoglobin of 5.5.  She has had ongoing GI bleeding requiring frequent and multiple transfusions.  Completed 9 units PRBCs thus far this admission and getting her 10th  and 11th unit today for hemoglobin of 6.4.  Continue to follow CBCs closely and transfuse if hemoglobin 7 g or less.  Since she appeared slightly volume overloaded with pedal edema, I gave her Lasix 20 mg IV x1 dose between 2 units PRBCs.  General surgery consulted a few days ago and signed off.  Have not had a PT/INR this admission, will check at next draw.  If abnormal may consider vitamin K or FFP.  Thrombocytopenia:  May have been related to acute blood loss anemia.  Resolved  Active Problems Chronic hypoxic respiratory failure - CTA of the chest negative for PE. - Remained stable on her current home level of oxygen 3 L/min.  Essential hypertension -Continue metoprolol.  Controlled.  DM2 -Continue Lantus and sliding scale.  Reasonable inpatient control.  CKD stage IIIb -Baseline creatinine 1.3-1.8.  Creatinine normal/1.11  Morbid obesity -Based on a BMI of 47, will benefit from weight loss  Chronic diastolic CHF -Appears euvolemic except for trace ankle edema.  Gave a dose of IV Lasix.  Scheduled Meds: . sodium chloride   Intravenous Once  . busPIRone  15 mg Oral Daily  . insulin aspart  0-15 Units Subcutaneous TID WC  . insulin aspart  0-5 Units Subcutaneous QHS  . insulin glargine  20 Units Subcutaneous Daily  . levothyroxine  50 mcg Oral Daily  . mouth rinse  15 mL Mouth Rinse BID  . metoprolol succinate  50 mg Oral Daily  . QUEtiapine  100 mg Oral QHS  . sodium chloride flush  10-40 mL Intracatheter Q12H  .  zolpidem  5 mg Oral QHS   Continuous Infusions:  PRN Meds:.acetaminophen **OR** acetaminophen, albuterol, alum & mag hydroxide-simeth, cyclobenzaprine, fluticasone, influenza vaccine adjuvanted, LORazepam, LORazepam, morphine injection, sodium chloride flush  DVT prophylaxis: SCDs Code Status: Full code Family Communication: Discussed with patient.  I discussed in detail with patient's sister who is a PACU nurse in [place], [place] on 3/13, updated care and  answered questions.  She was appreciative of the call.  I discussed in detail with patient's daughter at bedside on 3/12. Patient admitted from: Home Anticipated d/c place: Home Barriers to d/c: Ongoing issues with GI bleeding, anemia requiring multiple transfusions, awaiting further evaluation by GI.  Consultants:  Gastroenterology General surgery   Procedures:  Colonoscopy 3/9 Impression:               - Old blood in the entire examined colon.                           - Friability with spontaneous minimal bleeding in                            what I believe was the cecum/appendiceal orifice                            region. Clips were placed. Tattooed.                           - No specimens collected.                           - It is unclear whether the observed oozing                            accounts for the patient's continuing blood loss,                            and/or whether it is a localized problem or                            indicative of a more generalized process.  Microbiology  None   Antimicrobials: None     Objective: Vitals:   11/23/19 1250 11/23/19 1308 11/23/19 1530 11/23/19 1603  BP: (!) 141/83 126/66 99/69 113/77  Pulse: 86 85 87 86  Resp: (!) 26 (!) 22 (!) 22 (!) 22  Temp: 98.4 F (36.9 C) (!) 97.5 F (36.4 C) 97.9 F (36.6 C) (!) 97.5 F (36.4 C)  TempSrc: Oral Oral Oral Oral  SpO2: 100% 100% 100% 100%  Weight:      Height:        Intake/Output Summary (Last 24 hours) at 11/23/2019 1620 Last data filed at 11/23/2019 1245 Gross per 24 hour  Intake 1190.95 ml  Output --  Net 1190.95 ml   Filed Weights   11/13/19 0330  Weight: 122.5 kg    Examination:  Constitutional: Middle-aged female, moderately built and morbidly obese lying comfortably propped up in bed without distress.  Despite all that is going on, she remains in good spirits. Respiratory: Clear to auscultation without wheezing, rhonchi or crackles.  No increased work  of breathing.  On prior  home level of oxygen 3 L/min continuously. Cardiovascular: S1 and S2 heard, RRR.  No JVD, murmurs.  Trace bilateral ankle edema.  Telemetry personally reviewed: Sinus rhythm. Abdomen: Abdomen is obese but not distended, soft and nontender.  No organomegaly or masses appreciated.  Normal bowel sounds heard. Neurologic: Alert and oriented without focal neurological deficits. Extremities: Trace bilateral ankle edema.   Data Reviewed: I have independently reviewed following labs and imaging studies   CBC: Recent Labs  Lab 11/21/19 0457 11/21/19 1923 11/22/19 0437 11/22/19 1545 11/23/19 0558  WBC 9.7 9.6 10.2 10.2 9.7  NEUTROABS  --  5.5  --   --   --   HGB 7.0* 8.7* 7.8* 7.7* 6.4*  HCT 22.5* 28.6* 25.1* 24.0* 20.6*  MCV 94.9 95.0 93.3 90.2 94.9  PLT 165 187 174 194 035   Basic Metabolic Panel: Recent Labs  Lab 11/19/19 0711 11/20/19 0614 11/21/19 0457 11/22/19 0437 11/23/19 0558  NA 136 137 137 138 140  K 4.1 3.9 4.5 4.5 4.1  CL 110 110 108 110 111  CO2 22 21* 24 23 24   GLUCOSE 161* 140* 149* 109* 98  BUN 29* 20 21 16 12   CREATININE 1.18* 1.12* 1.08* 1.04* 1.11*  CALCIUM 7.9* 7.8* 8.3* 8.1* 7.8*  MG  --  2.0  --   --   --   PHOS  --  1.9*  --   --   --    Liver Function Tests: No results for input(s): AST, ALT, ALKPHOS, BILITOT, PROT, ALBUMIN in the last 168 hours. Coagulation Profile: No results for input(s): INR, PROTIME in the last 168 hours. HbA1C: No results for input(s): HGBA1C in the last 72 hours. CBG: Recent Labs  Lab 11/22/19 1032 11/22/19 1651 11/22/19 2029 11/23/19 0741 11/23/19 1336  GLUCAP 111* 128* 133* 81 99    No results found for this or any previous visit (from the past 240 hour(s)).   Radiology Studies: No results found. Vernell Leep, MD, [place], [facility]. [facility]  To contact the attending provider between 7A-7P or the covering provider during after hours 7P-7A, please log into the web site [email or web address]  and access using universal [place] password for that web site. If you do not have the password, please call the hospital operator.

## 2019-11-23 NOTE — Brief Op Note (Signed)
11/13/2019 - 11/23/2019  12:31 PM  PATIENT:  Sue [name]  69 y.o. female  PRE-OPERATIVE DIAGNOSIS:  hematochezia  POST-OPERATIVE DIAGNOSIS:  old blood noted throughout colon, no obvious source found  PROCEDURE:  Procedure(s): COLONOSCOPY WITH PROPOFOL (N/A)  SURGEON:  Surgeon(s) and Role:    [name], MD - Primary  PHYSICIAN ASSISTANT:   ASSISTANTS: Grace Isaac, RN, Laverda Sorenson, Tech  ANESTHESIA:   MAC  EBL:  None  BLOOD ADMINISTERED:none  DRAINS: none   LOCAL MEDICATIONS USED:  NONE  SPECIMEN:  No Specimen  DISPOSITION OF SPECIMEN:  N/A  COUNTS:  YES  TOURNIQUET:  * No tourniquets in log *  DICTATION: .Dragon Dictation  PLAN OF CARE: Admit to inpatient   PATIENT DISPOSITION:  PACU - hemodynamically stable.   Delay start of Pharmacological VTE agent (>24hrs) due to surgical blood loss or risk of bleeding: yes

## 2019-11-23 NOTE — Progress Notes (Signed)
Patient had bowel movement this morning maroon/bright red blood with clots.

## 2019-11-23 NOTE — Interval H&P Note (Signed)
History and Physical Interval Note: 68/female with ongoing lower GI bleeding for a colonoscopy.  11/23/2019 11:34 AM  Sue [name]  has presented today for colonoscopy, with the diagnosis of hematochezia.  The various methods of treatment have been discussed with the patient and family. After consideration of risks, benefits and other options for treatment, the patient has consented to  Procedure(s): COLONOSCOPY WITH PROPOFOL (N/A) as a surgical intervention.  The patient's history has been reviewed, patient examined, no change in status, stable for surgery.  I have reviewed the patient's chart and labs.  Questions were answered to the patient's satisfaction.     [name]

## 2019-11-24 ENCOUNTER — Inpatient Hospital Stay (HOSPITAL_COMMUNITY): Payer: Medicare HMO | Admitting: Anesthesiology

## 2019-11-24 ENCOUNTER — Encounter: Payer: Self-pay | Admitting: *Deleted

## 2019-11-24 ENCOUNTER — Encounter (HOSPITAL_COMMUNITY)
Admission: EM | Disposition: A | Discharge status: Home Health | Payer: Self-pay | Source: Home / Self Care | Attending: Internal Medicine

## 2019-11-24 HISTORY — PX: ESOPHAGOGASTRODUODENOSCOPY (EGD) WITH PROPOFOL: SHX5813

## 2019-11-24 HISTORY — PX: GIVENS CAPSULE STUDY: SHX5432

## 2019-11-24 HISTORY — PX: ENTEROSCOPY: SHX5533

## 2019-11-24 HISTORY — PX: HOT HEMOSTASIS: SHX5433

## 2019-11-24 LAB — TYPE AND SCREEN
ABO/RH(D): O POS
Antibody Screen: NEGATIVE
Unit division: 0
Unit division: 0
Unit division: 0

## 2019-11-24 LAB — CBC
HCT: 25.3 % — ABNORMAL LOW (ref 36.0–46.0)
HCT: 23.4 % — ABNORMAL LOW (ref 36.0–46.0)
HCT: 26 % — ABNORMAL LOW (ref 36.0–46.0)
Hemoglobin: 7.6 g/dL — ABNORMAL LOW (ref 12.0–15.0)
Hemoglobin: 7.3 g/dL — ABNORMAL LOW (ref 12.0–15.0)
Hemoglobin: 8.2 g/dL — ABNORMAL LOW (ref 12.0–15.0)
MCH: 28.5 pg (ref 26.0–34.0)
MCH: 28.6 pg (ref 26.0–34.0)
MCH: 28.9 pg (ref 26.0–34.0)
MCHC: 30 g/dL (ref 30.0–36.0)
MCHC: 31.2 g/dL (ref 30.0–36.0)
MCHC: 31.5 g/dL (ref 30.0–36.0)
MCV: 94.8 fL (ref 80.0–100.0)
MCV: 91.8 fL (ref 80.0–100.0)
MCV: 91.5 fL (ref 80.0–100.0)
Platelets: 144 10*3/uL — ABNORMAL LOW (ref 150–400)
Platelets: 177 10*3/uL (ref 150–400)
Platelets: 190 10*3/uL (ref 150–400)
RBC: 2.67 MIL/uL — ABNORMAL LOW (ref 3.87–5.11)
RBC: 2.55 MIL/uL — ABNORMAL LOW (ref 3.87–5.11)
RBC: 2.84 MIL/uL — ABNORMAL LOW (ref 3.87–5.11)
RDW: 17 % — ABNORMAL HIGH (ref 11.5–15.5)
RDW: 16.6 % — ABNORMAL HIGH (ref 11.5–15.5)
RDW: 16.6 % — ABNORMAL HIGH (ref 11.5–15.5)
WBC: 7.7 10*3/uL (ref 4.0–10.5)
WBC: 7.6 10*3/uL (ref 4.0–10.5)
WBC: 8.1 10*3/uL (ref 4.0–10.5)
nRBC: 0.7 % — ABNORMAL HIGH (ref 0.0–0.2)
nRBC: 0.4 % — ABNORMAL HIGH (ref 0.0–0.2)
nRBC: 0.7 % — ABNORMAL HIGH (ref 0.0–0.2)

## 2019-11-24 LAB — BASIC METABOLIC PANEL
Anion gap: 6 (ref 5–15)
BUN: 10 mg/dL (ref 8–23)
CO2: 25 mmol/L (ref 22–32)
Calcium: 7.6 mg/dL — ABNORMAL LOW (ref 8.9–10.3)
Chloride: 112 mmol/L — ABNORMAL HIGH (ref 98–111)
Creatinine, Ser: 1.11 mg/dL — ABNORMAL HIGH (ref 0.44–1.00)
GFR calc Af Amer: 60 mL/min — ABNORMAL LOW (ref >60)
GFR calc non Af Amer: 51 mL/min — ABNORMAL LOW (ref >60)
Glucose, Bld: 83 mg/dL (ref 70–99)
Potassium: 4 mmol/L (ref 3.5–5.1)
Sodium: 143 mmol/L (ref 135–145)

## 2019-11-24 LAB — GLUCOSE, CAPILLARY
Glucose-Capillary: 81 mg/dL (ref 70–99)
Glucose-Capillary: 84 mg/dL (ref 70–99)
Glucose-Capillary: 92 mg/dL (ref 70–99)
Glucose-Capillary: 135 mg/dL — ABNORMAL HIGH (ref 70–99)
Glucose-Capillary: 139 mg/dL — ABNORMAL HIGH (ref 70–99)

## 2019-11-24 LAB — BPAM RBC
Blood Product Expiration Date: 202104062359
Blood Product Expiration Date: 202104112359
Blood Product Expiration Date: 202104142359
ISSUE DATE / TIME: 202103111309
ISSUE DATE / TIME: 202103131055
ISSUE DATE / TIME: 202103131555
Unit Type and Rh: 5100
Unit Type and Rh: 5100
Unit Type and Rh: 5100

## 2019-11-24 SURGERY — ESOPHAGOGASTRODUODENOSCOPY (EGD) WITH PROPOFOL
Anesthesia: Monitor Anesthesia Care

## 2019-11-24 MED ORDER — LACTATED RINGERS IV SOLN: INTRAVENOUS | Status: DC

## 2019-11-24 MED ORDER — SODIUM CHLORIDE 0.9 % IV SOLN: INTRAVENOUS | Status: DC

## 2019-11-24 MED ORDER — DEXTROSE 5 % IV SOLN: INTRAVENOUS | Status: AC

## 2019-11-24 MED ORDER — LACTATED RINGERS IV SOLN: INTRAVENOUS | Status: DC | PRN

## 2019-11-24 MED ORDER — PROPOFOL 500 MG/50ML IV EMUL
INTRAVENOUS | Status: DC | PRN
  Administered 2019-11-24 (×2): 30 mg via INTRAVENOUS
  Administered 2019-11-24: 50 mg via INTRAVENOUS
  Administered 2019-11-24: 30 mg via INTRAVENOUS
  Administered 2019-11-24: 20 mg via INTRAVENOUS
  Administered 2019-11-24: 30 mg via INTRAVENOUS
  Administered 2019-11-24: 20 mg via INTRAVENOUS

## 2019-11-24 SURGICAL SUPPLY — 14 items

## 2019-11-24 NOTE — Progress Notes (Signed)
Patient in endo for procedure.

## 2019-11-24 NOTE — Anesthesia Postprocedure Evaluation (Signed)
Anesthesia Post Note  Patient: ROBERTHA STAPLES  Procedure(s) Performed: ESOPHAGOGASTRODUODENOSCOPY (EGD) WITH PROPOFOL (N/A ) HOT HEMOSTASIS (ARGON PLASMA COAGULATION/BICAP) (N/A ) ENTEROSCOPY (N/A ) GIVENS CAPSULE STUDY (N/A )     Patient location during evaluation: Endoscopy Anesthesia Type: MAC Level of consciousness: awake and alert Pain management: pain level controlled Vital Signs Assessment: post-procedure vital signs reviewed and stable Respiratory status: spontaneous breathing, nonlabored ventilation, respiratory function stable and patient connected to nasal cannula oxygen Cardiovascular status: blood pressure returned to baseline and stable Postop Assessment: no apparent nausea or vomiting Anesthetic complications: no    Last Vitals:  Vitals:   11/24/19 1137 11/24/19 1153  BP: (!) 172/79 (!) 173/97  Pulse: 88 82  Resp: (!) 33 (!) 23  Temp:    SpO2: 94% 100%    Last Pain:  Vitals:   11/24/19 1137  TempSrc:   PainSc: 0-No pain                 Barnet Glasgow

## 2019-11-24 NOTE — Op Note (Signed)
Push enteroscopy was performed.  Findings: Normal esophagus, normal stomach, normal duodenal bulb and duodenum. No evidence of active or recent bleeding unlike seen on GI bleeding scan. 2 small AVMs noted in proximal jejunum, treated with APC. A small bowel PillCam was deployed in the duodenal bulb.   Recommendations: N.p.o. for now. Okay to start clear liquid diet 2 hours later. Await small bowel PillCam results. Monitor H&H and transfuse as needed.   [name], MD

## 2019-11-24 NOTE — Anesthesia Preprocedure Evaluation (Addendum)
Anesthesia Evaluation  Patient identified by MRN, date of birth, ID band Patient awake    Reviewed: Allergy & Precautions, NPO status , Patient's Chart, lab work & pertinent test results  Airway Mallampati: II  TM Distance: >3 FB Neck ROM: Full    Dental no notable dental hx. (+) Teeth Intact, Dental Advisory Given   Pulmonary neg pulmonary ROS, former smoker,    Pulmonary exam normal breath sounds clear to auscultation       Cardiovascular hypertension, + DOE  Normal cardiovascular exam Rhythm:Regular Rate:Normal     Neuro/Psych    GI/Hepatic GERD  ,(+) Hepatitis -  Endo/Other  diabetesHypothyroidism   Renal/GU K+ 4.0 Cr 1.11     Musculoskeletal negative musculoskeletal ROS (+)   Abdominal (+) + obese,   Peds  Hematology  (+) anemia , hgb 7.6   Anesthesia Other Findings   Reproductive/Obstetrics negative OB ROS                            Anesthesia Physical Anesthesia Plan  ASA: III  Anesthesia Plan: MAC   Post-op Pain Management:    Induction:   PONV Risk Score and Plan: 2 and Treatment may vary due to age or medical condition, Ondansetron and Propofol infusion  Airway Management Planned: Natural Airway and Simple Face Mask  Additional Equipment: None  Intra-op Plan:   Post-operative Plan:   Informed Consent: I have reviewed the patients History and Physical, chart, labs and discussed the procedure including the risks, benefits and alternatives for the proposed anesthesia with the patient or authorized representative who has indicated his/her understanding and acceptance.     Dental advisory given  Plan Discussed with: CRNA  Anesthesia Plan Comments: (EGD for GI bleed s/p Colonoscopy 3/13)       Anesthesia Quick Evaluation

## 2019-11-24 NOTE — Op Note (Signed)
[facility] Patient Name: Sue [name] Procedure Date: 11/24/2019 MRN: [id number] Attending MD: [name] , MD Date of Birth: 1950-10-08 CSN: [id number] Age: 69 Admit Type: Inpatient Procedure:                Upper GI endoscopy Indications:              Gastrointestinal bleeding of unknown origin,                            Bleedign scan positive for active bleeding from                            duodenum/proximal small bowel Providers:                [name], MD, Angus Seller, Laverda Sorenson,                            Technician, Corie Chiquito, Technician, Eliberto Ivory                            CRNA Referring MD:             [facility] Medicines:                Monitored Anesthesia Care Complications:            No immediate complications. Estimated Blood Loss:     Estimated blood loss: none. Procedure:                Pre-Anesthesia Assessment:                           - Prior to the procedure, a History and Physical                            was performed, and patient medications and                            allergies were reviewed. The patient's tolerance of                            previous anesthesia was also reviewed. The risks                            and benefits of the procedure and the sedation                            options and risks were discussed with the patient.                            All questions were answered, and informed consent                            was obtained. Prior Anticoagulants: The patient has                            taken no previous anticoagulant  or antiplatelet                            agents. ASA Grade Assessment: III - A patient with                            severe systemic disease. After reviewing the risks                            and benefits, the patient was deemed in                            satisfactory condition to undergo the procedure.                           After obtaining  informed consent, the endoscope was                            passed under direct vision. Throughout the                            procedure, the patient's blood pressure, pulse, and                            oxygen saturations were monitored continuously. The                            [id number]-Q180 ([id number]) Olympus enteroscope was                            introduced through the mouth, and advanced to the                            proximal jejunum. The upper GI endoscopy was                            accomplished without difficulty. The patient                            tolerated the procedure well. Scope In: Scope Out: Findings:      The examined esophagus was normal.      The Z-line was regular and was found 40 cm from the incisors.      The entire examined stomach was normal.      The examined duodenum was normal.      Two 2 to 3 mm angioectasias without bleeding were found in the jejunum.       Coagulation for tissue destruction using argon plasma at 1 liter/minute       and 20 watts was successful.      The scope was then withdrawn and an adult gastrosocpe was used to deploy       a pillcam in the duodenal bulb. Using the endoscope, the video capsule       enteroscope was advanced into the duodenal bulb. The pillcam bounced       back into the antrum, but was successfully  pushed back into the duodenum       with the introducer. Impression:               - Normal esophagus.                           - Z-line regular, 40 cm from the incisors.                           - Normal stomach.                           - Normal examined duodenum.                           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                            Treated with argon plasma coagulation (APC).                           - Successful completion of the Video Capsule                            Enteroscope placement.                           - No specimens collected. Moderate Sedation:       Patient did not receive moderate sedation for this procedure, but       instead received monitored anesthesia care. Recommendation:           - NPO. Please follow instructions for diet as per                            small bowel pillcam guidelines. Procedure Code(s):        --- Professional ---                           [phone], Esophagogastroduodenoscopy, flexible,                            transoral; with ablation of tumor(s), polyp(s), or                            other lesion(s) (includes pre- and post-dilation                            and guide wire passage, when performed) Diagnosis Code(s):        --- Professional ---                           K55.20, Angiodysplasia of colon without hemorrhage                           K92.2, Gastrointestinal hemorrhage, unspecified CPT copyright 2019 [employer] Association. All rights reserved. The codes documented in this report are preliminary and upon coder review may  be revised to meet current compliance requirements. [name], MD 11/24/2019 11:43:50 AM This report has been signed electronically. Number of Addenda: 0

## 2019-11-24 NOTE — Transfer of Care (Signed)
Immediate Anesthesia Transfer of Care Note  Patient: Sue [name]  Procedure(s) Performed: Procedure(s) with comments: ESOPHAGOGASTRODUODENOSCOPY (EGD) WITH PROPOFOL (N/A) - PUSH enteroscopy HOT HEMOSTASIS (ARGON PLASMA COAGULATION/BICAP) (N/A)  Patient Location: PACU and Endoscopy Unit  Anesthesia Type:General  Level of Consciousness: awake, alert  and oriented  Airway & Oxygen Therapy: Patient Spontanous Breathing and Patient connected to nasal cannula oxygen  Post-op Assessment: Report given to RN and Post -op Vital signs reviewed and stable  Post vital signs: Reviewed and stable  Last Vitals:  Vitals:   11/24/19 0500 11/24/19 1036  BP: 132/90 (!) 148/75  Pulse: 90 81  Resp: 20 (!) 24  Temp: 37 C 36.7 C  SpO2: 072% 257%    Complications: No apparent anesthesia complications

## 2019-11-24 NOTE — Progress Notes (Addendum)
PROGRESS NOTE  Sue [name] YCX:[id number] DOB: October 06, 1950 DOA: 11/13/2019 PCP: Ann Held, DO   LOS: 11 days   Brief Narrative / Interim history: 69 year old female with history of DM 2, HTN, COPD on chronic 3 L nasal cannula at home, morbid obesity, who was admitted to the hospital with bright red blood per rectum for the past day.  She had a similar episode last December for which she was hospitalized, colonoscopy at that time did not reveal any source but did show diverticulosis.  [facility] consulted, has had multiple colonoscopies this admission which shows ongoing bleeding in the colon without clear identifiable source.  Last colonoscopy today 3/13.  GI bleeding scan ordered. Over the course of her hospitalization, she has required frequent and multiple PRBC transfusions due to recurrent drop in her hemoglobin from ongoing GI bleed.  Completed 11 units PRBC transfusion thus far.  S/p EGD 3/14.  Subjective / 24h Interval events: I saw the patient this morning prior to procedure.  In the last 24 hours, she reported 1 small BM with "couple of tablespoon full" of bright red blood.  Specifically denied worsening dyspnea, chest pain, dizziness or lightheadedness.  Reports chronic dyspnea which is at baseline, mostly on exertion.  Assessment & Plan: Principal Problem Lower GI bleed with acute blood loss anemia  [facility] consulted and actively assisting.  Patient has undergone multiple colonoscopies (3) this admission which has shown blood in the colon without clear source.  Her last colonoscopy was 3/13.   Capsule endoscopy 3/9 reported by GI to be negative for source of significant bleeding.  There was no blood in the intestinal lumen during the 12 hours of monitoring.    NM GI bleeding scan 3/13: Active GI bleeding likely originating in the duodenum and proximal small bowel.  S/p EGD 3/14: Normal esophagus, stomach and duodenum.  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  Okay to start clear liquids 2 hours post procedure.  Patient presented with hemoglobin of 5.5.  She has had ongoing GI bleeding requiring frequent and multiple transfusions.  Completed 11 units PRBCs thus far this admission.  Continue to follow CBCs closely and transfuse if hemoglobin 7 g or less.  INR normal.  General surgery consulted a few days ago and signed off.  Hemoglobin again has dropped from 8.7-7.6.  Follow CBC later this afternoon and transfuse as needed  Thrombocytopenia:  May have been related to acute blood loss anemia.  This had resolved but platelets mildly low today.  Follow CBC.  Active Problems Chronic hypoxic respiratory failure - CTA of the chest negative for PE. -Has had no new respiratory symptoms, breathing at baseline and stable with prior home level of oxygen 3 L/min.  Essential hypertension -Continue metoprolol.  Controlled.  DM2 -Continue Lantus and sliding scale.  Reasonable inpatient control.  CKD stage IIIb -Baseline creatinine 1.3-1.8.  Creatinine normal/1.11  Morbid obesity -Based on a BMI of 47, will benefit from weight loss  Chronic diastolic CHF -Appears euvolemic except for trace ankle edema.  S/p a dose of Lasix 20 mg IV x1 on 3/13 with reported good urine output and improvement in ankle edema.  Scheduled Meds: . sodium chloride   Intravenous Once  . busPIRone  15 mg Oral Daily  . insulin aspart  0-15 Units Subcutaneous TID WC  . insulin aspart  0-5 Units Subcutaneous QHS  . insulin glargine  20 Units Subcutaneous  Daily  . levothyroxine  50 mcg Oral Daily  . mouth rinse  15 mL Mouth Rinse BID  . metoprolol succinate  50 mg Oral Daily  . QUEtiapine  100 mg Oral QHS  . sodium chloride flush  10-40 mL Intracatheter Q12H  . zolpidem  5 mg Oral QHS   Continuous Infusions: . dextrose 30 mL/hr at 11/24/19 0830   PRN Meds:.acetaminophen **OR** acetaminophen, albuterol,  alum & mag hydroxide-simeth, cyclobenzaprine, fluticasone, influenza vaccine adjuvanted, LORazepam, LORazepam, morphine injection, sodium chloride flush  DVT prophylaxis: SCDs Code Status: Full code Family Communication: Discussed with patient.  I discussed in detail with patient's sister who is a PACU nurse in [place], [place] on 3/14, updated care and answered questions.   Patient admitted from: Home Anticipated d/c place: Home Barriers to d/c: Ongoing issues with GI bleeding, anemia requiring multiple transfusions, awaiting further evaluation by GI.  Consultants:  [facility] General surgery   Procedures:   Colonoscopy 3/9, 3/12, 3/13  Capsule endoscopy 3/9: Report as follows by GI  Capsule endoscopy, obtained the day before yesterday, has been reviewed, and appears to be negative for a source of significant bleeding.  There was no blood in the intestinal lumen during the 12 hours of monitoring.  Although multiple (approximately 10 or 20) tiny red spots were noted in the small bowel, primarily the proximal small bowel (possibly representing vascular ectasia, or focal inflammatory areas from NSAID exposure), none of these appeared to be bleeding and none of them seemed likely to account for a significant drop in hemoglobin as has occurred overnight.   EGD 3/14:  Impression: - Normal esophagus. - Z-line regular, 40 cm from the incisors. - Normal stomach. - Normal examined duodenum. 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 Treated with argon plasma coagulation (APC). - Successful completion of the Video Capsule Enteroscope placement. - No specimens collected.  Microbiology  None   Antimicrobials: None     Objective: Vitals:   11/24/19 1137 11/24/19 1153 11/24/19 1200 11/24/19 1230  BP: (!) 172/79 (!) 173/97 (!) 150/76 (!) 154/90  Pulse: 88 82 82 80  Resp: (!) 33 (!) 23 (!) 22   Temp:   98.1 F (36.7 C) 97.6 F (36.4 C)  TempSrc:   Oral Oral  SpO2: 94%  100% 100% 100%  Weight:      Height:        Intake/Output Summary (Last 24 hours) at 11/24/2019 1249 Last data filed at 11/24/2019 1134 Gross per 24 hour  Intake 665 ml  Output --  Net 665 ml   Filed Weights   11/13/19 0330 11/24/19 1036  Weight: 122.5 kg 122.5 kg    Examination:  Constitutional: Middle-aged female, moderately built and morbidly obese lying comfortably propped up in bed without distress.  Oral mucosa moist. Respiratory: Clear to auscultation.  No increased work of breathing. Cardiovascular: S1 and S2 heard, RRR.  No JVD, murmurs.  Ankle edema is minimal, improved compared to yesterday.  Telemetry personally reviewed: Sinus rhythm. Abdomen: Abdomen is obese but not distended, soft and nontender.  No organomegaly or masses appreciated.  Normal bowel sounds heard. Neurologic: Alert and oriented without focal neurological deficits. Extremities: Trace bilateral ankle edema.   Data Reviewed: I have independently reviewed following labs and imaging studies   CBC: Recent Labs  Lab 11/21/19 1923 11/21/19 1923 11/22/19 0437 11/22/19 1545 11/23/19 0558 11/23/19 2225 11/24/19 0538  WBC 9.6  --  10.2 10.2 9.7  --  7.7  NEUTROABS 5.5  --   --   --   --   --   --  HGB 8.7*   < > 7.8* 7.7* 6.4* 8.7* 7.6*  HCT 28.6*   < > 25.1* 24.0* 20.6* 27.4* 25.3*  MCV 95.0  --  93.3 90.2 94.9  --  94.8  PLT 187  --  174 194 192  --  144*   < > = values in this interval not displayed.   Basic Metabolic Panel: Recent Labs  Lab 11/20/19 0614 11/21/19 0457 11/22/19 0437 11/23/19 0558 11/24/19 0538  NA 137 137 138 140 143  K 3.9 4.5 4.5 4.1 4.0  CL 110 108 110 111 112*  CO2 21* 24 23 24 25   GLUCOSE 140* 149* 109* 98 83  BUN 20 21 16 12 10   CREATININE 1.12* 1.08* 1.04* 1.11* 1.11*  CALCIUM 7.8* 8.3* 8.1* 7.8* 7.6*  MG 2.0  --   --   --   --   PHOS 1.9*  --   --   --   --    Liver Function Tests: No results for input(s): AST, ALT, ALKPHOS, BILITOT, PROT, ALBUMIN in the  last 168 hours. Coagulation Profile: Recent Labs  Lab 11/23/19 2225  INR 1.2   HbA1C: No results for input(s): HGBA1C in the last 72 hours. CBG: Recent Labs  Lab 11/23/19 1336 11/23/19 1653 11/23/19 2202 11/24/19 0756 11/24/19 1228  GLUCAP 99 86 85 81 92    No results found for this or any previous visit (from the past 240 hour(s)).   Radiology Studies: NM GI Blood Loss  Result Date: 11/23/2019 CLINICAL DATA:  69 year old female with GI bleed. EXAM: NUCLEAR MEDICINE GASTROINTESTINAL BLEEDING SCAN TECHNIQUE: Sequential abdominal images were obtained following intravenous administration of Tc-40m labeled red blood cells. RADIOPHARMACEUTICALS:  21.4 mCi Tc-56m pertechnetate in-vitro labeled red cells. COMPARISON:  CT abdomen pelvis dated 11/13/2019. FINDINGS: There is radiopharmaceutical activity in the upper abdomen which appears to correspond to the duodenal C-loop and may be related to underlying peptic ulcer. Clinical correlation is recommended. Delayed images demonstrate further progression of the radiopharmaceutical into the small bowel distal to the ligament of Treitz. Physiologic activity noted in the liver, cardiac blood pool, and vasculature. IMPRESSION: Active GI bleed likely origin ating in the duodenum or proximal small bowel. These results were called by telephone at the time of interpretation on 11/23/2019 at 9:29 pm to nurse Blaydes, who verbally acknowledged these results. Electronically Signed   By: Anner Crete M.D.   On: 11/23/2019 20:34   Vernell Leep, MD, [place], [facility]. [facility]  To contact the attending provider between 7A-7P or the covering provider during after hours 7P-7A, please log into the web site [email or web address] and access using universal [place] password for that web site. If you do not have the password, please call the hospital operator.

## 2019-11-24 NOTE — Brief Op Note (Signed)
11/13/2019 - 11/24/2019  11:44 AM  PATIENT:  Sue [name]  69 y.o. female  PRE-OPERATIVE DIAGNOSIS:  GI bleeding  POST-OPERATIVE DIAGNOSIS:  * No post-op diagnosis entered *  PROCEDURE:  Procedure(s) with comments: ESOPHAGOGASTRODUODENOSCOPY (EGD) WITH PROPOFOL (N/A) - PUSH enteroscopy HOT HEMOSTASIS (ARGON PLASMA COAGULATION/BICAP) (N/A)  SURGEON:  Surgeon(s) and Role:    [name], MD - Primary  PHYSICIAN ASSISTANT:   ASSISTANTS: Vickey Sages, Tech, Carlyle Basques, Tech   ANESTHESIA:   MAC  EBL:  None  BLOOD ADMINISTERED:none  DRAINS: none   LOCAL MEDICATIONS USED:  NONE  SPECIMEN:  No Specimen  DISPOSITION OF SPECIMEN:  N/A  COUNTS:  YES  TOURNIQUET:  * No tourniquets in log *  DICTATION: .Dragon Dictation  PLAN OF CARE: Admit to inpatient   PATIENT DISPOSITION:  PACU - hemodynamically stable.   Delay start of Pharmacological VTE agent (>24hrs) due to surgical blood loss or risk of bleeding: yes

## 2019-11-25 LAB — CBC
HCT: 21.8 % — ABNORMAL LOW (ref 36.0–46.0)
Hemoglobin: 6.7 g/dL — CL (ref 12.0–15.0)
MCH: 28.5 pg (ref 26.0–34.0)
MCHC: 30.7 g/dL (ref 30.0–36.0)
MCV: 92.8 fL (ref 80.0–100.0)
Platelets: 152 10*3/uL (ref 150–400)
RBC: 2.35 MIL/uL — ABNORMAL LOW (ref 3.87–5.11)
RDW: 16.3 % — ABNORMAL HIGH (ref 11.5–15.5)
WBC: 8.6 10*3/uL (ref 4.0–10.5)
nRBC: 0.3 % — ABNORMAL HIGH (ref 0.0–0.2)

## 2019-11-25 LAB — GLUCOSE, CAPILLARY
Glucose-Capillary: 95 mg/dL (ref 70–99)
Glucose-Capillary: 127 mg/dL — ABNORMAL HIGH (ref 70–99)
Glucose-Capillary: 97 mg/dL (ref 70–99)
Glucose-Capillary: 116 mg/dL — ABNORMAL HIGH (ref 70–99)

## 2019-11-25 LAB — PREPARE RBC (CROSSMATCH)

## 2019-11-25 MED ORDER — SODIUM CHLORIDE 0.9% IV SOLUTION: Freq: Once | INTRAVENOUS | Status: DC

## 2019-11-25 MED ORDER — PEG 3350-KCL-NA BICARB-NACL 420 G PO SOLR: 4000.0000 mL | Freq: Once | ORAL | Status: DC

## 2019-11-25 MED ORDER — POLYETHYLENE GLYCOL 3350 17 GM/SCOOP PO POWD
1.0000 | Freq: Once | ORAL | Status: DC
  Filled 2019-11-25: qty 255

## 2019-11-25 MED ORDER — FUROSEMIDE 10 MG/ML IJ SOLN: 20.0000 mg | Freq: Once | INTRAMUSCULAR | Status: DC

## 2019-11-25 NOTE — Care Management Important Message (Signed)
Important Message  Patient Details IM Letter given to Evette Cristal SW Case Manager to present to the Patient Name: Sue [name] MRN: [id number] Date of Birth: [birth year]   Medicare Important Message Given:  Yes     Kerin Salen 11/25/2019, 12:12 PM

## 2019-11-25 NOTE — Progress Notes (Signed)
Patient ID: Sue [name], female   DOB: 12-21-1950, 69 y.o.   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 Change back to clear liquids and give Miralax bowel prep today and NuLytely tomorrow and plan to do repeat colonoscopy Wednesday 11/27/19. If colonoscopy unrevealing, then will need inpt transfer to [facility] for double balloon/single balloon enteroscopy.

## 2019-11-25 NOTE — Progress Notes (Signed)
[facility] Progress Note  Sue [name] 69 y.o. 02-Dec-1950   Subjective: Two bloody stools reported overnight. Denies abdominal pain.  Objective: Vital signs: Vitals:   11/24/19 2103 11/25/19 0036  BP: 112/69 (!) 102/51  Pulse: 84 87  Resp: 17 19  Temp: 97.9 F (36.6 C) 98 F (36.7 C)  SpO2: 100% 100%    Physical Exam: Gen: lethargic, obese, no acute distress, pleasant HEENT: anicteric sclera CV: RRR Chest: CTA B Abd: soft, nontender, nondistended, +BS Ext: no edema  Lab Results: Recent Labs    11/23/19 0558 11/24/19 0538  NA 140 143  K 4.1 4.0  CL 111 112*  CO2 24 25  GLUCOSE 98 83  BUN 12 10  CREATININE 1.11* 1.11*  CALCIUM 7.8* 7.6*   No results for input(s): AST, ALT, ALKPHOS, BILITOT, PROT, ALBUMIN in the last 72 hours. Recent Labs    11/24/19 2110 11/25/19 0444  WBC 8.1 8.6  HGB 8.2* 6.7*  HCT 26.0* 21.8*  MCV 91.5 92.8  PLT 190 152      Assessment/Plan: Obscure GI bleeding - Positive bleeding scan on 11/23/19 that showed active bleeding in distal duodenum. Push enteroscopy yesterday did NOT show any active bleeding. 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  Hgb 6.7. Bloody stools reported overnight. Capsule endoscopy results pending (should be available for reviewing later this morning). If capsule endoscopy unrevealing, then may need a double balloon enteroscopy at [facility] in the near future. Diet changed to full liquids and consider soft foods tomorrow. D/W Dr. Algis Liming.   Sue [name] 11/25/2019, 9:50 AM  Questions please call [phone] ID: Sue [name], female   DOB: [birth year], 69 y.o.   MRN: [id number]

## 2019-11-25 NOTE — Progress Notes (Signed)
PROGRESS NOTE  Sue [name] EUM:[id number] DOB: July 11, 1951 DOA: 11/13/2019 PCP: Ann Held, DO   LOS: 12 days   Brief Narrative / Interim history: 69 year old female with history of DM 2, HTN, COPD on chronic 3 L nasal cannula at home, morbid obesity, who was admitted to the hospital with bright red blood per rectum for the past day.  She had a similar episode last December for which she was hospitalized, colonoscopy at that time did not reveal any source but did show diverticulosis.  [facility] consulted, has had multiple colonoscopies this admission which shows ongoing bleeding in the colon without clear identifiable source.  Last colonoscopy today 3/13.  GI bleeding scan ordered. Over the course of her hospitalization, she has required frequent and multiple PRBC transfusions due to recurrent drop in her hemoglobin from ongoing GI bleed.  Completed 11 units PRBC transfusion thus far.  S/p EGD 3/14. As per GI follow-up 3/15, plan repeating colonoscopy 11/27/2019 and if that is unrevealing then she will need inpatient transfer to [facility] for double-balloon/single balloon enteroscopy.  Subjective / 24h Interval events: Patient frustrated due to ongoing GI bleed and unable to definitively treat.  Had to BMs in the last 24 hours with total of approximately 50 to 75 mL (a cup) of some clots and dark blood.  No pain issues.  Breathing at baseline.  Assessment & Plan: Principal Problem Lower GI bleed with acute blood loss anemia  [facility] consulted and actively assisting.  Patient has undergone multiple colonoscopies (3) this admission which has shown blood in the colon without clear source.  Her last colonoscopy was 3/13.   Capsule endoscopy 3/9 reported by GI to be negative for source of significant bleeding.  There was no blood in the intestinal lumen during the 12 hours of monitoring.    NM GI bleeding scan 3/13: Active GI bleeding likely originating in the duodenum and proximal  small bowel.  S/p EGD 3/14: Normal esophagus, stomach and duodenum.  2 nonbleeding angiectasia's in the jejunum where APC and video capsule enteroscope was placed, await PillCam results.    Patient presented with hemoglobin of 5.5.  She has had ongoing GI bleeding requiring frequent and multiple transfusions.  Completed 11 units PRBCs thus far this admission.  Continue to follow CBCs closely and transfuse if hemoglobin 7 g or less.  INR normal.  General surgery consulted a few days ago and signed off.  Hemoglobin has dropped again from 8.2-6.7.  Transfusing her 12th unit of PRBC on 3/15.  Follow posttransfusion CBCs.  Will repeat Lasix 20 mg IV x1 posttransfusion to avoid volume overload given multiple transfusions.  Discussed with Dr. Michail Sermon.  Small bowel capsule endoscopy results as detailed below.  He plans repeat colonoscopy on 11/27/2019 with more thorough bowel prep.  However if colonoscopy is unrevealing then he recommends inpatient transfer to [facility] for double-balloon/single balloon enteroscopy.  Thrombocytopenia:  May have been related to acute blood loss anemia.  Resolved.  Active Problems Chronic hypoxic respiratory failure - CTA of the chest negative for PE. -Has had no new respiratory symptoms, breathing at baseline and stable with prior home level of oxygen 3 L/min.  Essential hypertension -Continue metoprolol.  Controlled.  DM2 -Continue Lantus and sliding scale.  Reasonable inpatient control.  CKD stage IIIb -Baseline creatinine 1.3-1.8.  Creatinine normal/1.11  Morbid obesity -Based on a BMI of 47, will benefit from weight loss  Chronic diastolic CHF -Appears euvolemic except for trace ankle edema.  Periodically for IV Lasix small dose due to multiple transfusions.  Scheduled Meds: . sodium chloride   Intravenous Once  . busPIRone  15 mg Oral Daily  . insulin aspart  0-15 Units Subcutaneous TID WC  . insulin aspart  0-5 Units Subcutaneous QHS  .  insulin glargine  20 Units Subcutaneous Daily  . levothyroxine  50 mcg Oral Daily  . mouth rinse  15 mL Mouth Rinse BID  . metoprolol succinate  50 mg Oral Daily  . polyethylene glycol powder  1 Container Oral Once  . [START ON 11/26/2019] polyethylene glycol-electrolytes  4,000 mL Oral Once  . QUEtiapine  100 mg Oral QHS  . sodium chloride flush  10-40 mL Intracatheter Q12H  . zolpidem  5 mg Oral QHS   Continuous Infusions:  PRN Meds:.acetaminophen **OR** acetaminophen, albuterol, alum & mag hydroxide-simeth, cyclobenzaprine, fluticasone, influenza vaccine adjuvanted, LORazepam, LORazepam, morphine injection, sodium chloride flush  DVT prophylaxis: SCDs Code Status: Full code Family Communication: Discussed with patient.  I discussed in detail with patient's sister who is a PACU nurse in [place], [place] on 3/14, updated care and answered questions.   Patient admitted from: Home Anticipated d/c place: Home Barriers to d/c: Ongoing issues with GI bleeding, anemia requiring multiple transfusions, awaiting further evaluation by GI.  Consultants:  [facility] General surgery   Procedures:   Colonoscopy 3/9, 3/12, 3/13  Capsule endoscopy 3/9: Report as follows by GI  Capsule endoscopy, obtained the day before yesterday, has been reviewed, and appears to be negative for a source of significant bleeding.  There was no blood in the intestinal lumen during the 12 hours of monitoring.  Although multiple (approximately 10 or 20) tiny red spots were noted in the small bowel, primarily the proximal small bowel (possibly representing vascular ectasia, or focal inflammatory areas from NSAID exposure), none of these appeared to be bleeding and none of them seemed likely to account for a significant drop in hemoglobin as has occurred overnight.   EGD 3/14:  Impression: - Normal esophagus. - Z-line regular, 40 cm from the incisors. - Normal stomach. - Normal examined duodenum. - Two  non-bleeding angioectasias in the jejunum. Treated with argon plasma coagulation (APC). - Successful completion of the Video Capsule Enteroscope placement. - No specimens collected.  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  Microbiology  None   Antimicrobials: None     Objective: Vitals:   11/24/19 1630 11/24/19 2103 11/25/19 0036 11/25/19 1352  BP: 115/70 112/69 (!) 102/51 118/64  Pulse: 81 84 87 99  Resp: (!) 22 17 19 20   Temp: 98.2 F (36.8 C) 97.9 F (36.6 C) 98 F (36.7 C) (!) 97.4 F (36.3 C)  TempSrc: Oral Oral Oral Oral  SpO2: 100% 100% 100% 99%  Weight:      Height:        Intake/Output Summary (Last 24 hours) at 11/25/2019 1430 Last data filed at 11/24/2019 2300 Gross per 24 hour  Intake 660 ml  Output --  Net 660 ml   Filed Weights   11/13/19 0330 11/24/19 1036  Weight: 122.5 kg 122.5 kg    Examination:  Constitutional: Middle-aged female, moderately built and morbidly obese lying comfortably propped up in bed without distress.  Oral mucosa moist. Respiratory: Clear to auscultation.  No increased work of breathing. Cardiovascular: S1 and S2 heard,  RRR.  No JVD, murmurs.  Mild ankle edema.  Telemetry personally reviewed: Sinus rhythm. Abdomen: Abdomen is obese but not distended, soft and nontender.  No organomegaly or masses appreciated.  Normal bowel sounds heard. Neurologic: Alert and oriented without focal neurological deficits. Extremities: Trace bilateral ankle edema.   Data Reviewed: I have independently reviewed following labs and imaging studies   CBC: Recent Labs  Lab 11/21/19 1923 11/22/19 0437 11/23/19 0558 11/23/19 0558 11/23/19 2225 11/24/19 0538 11/24/19 1453 11/24/19 2110 11/25/19 0444  WBC 9.6   < > 9.7  --   --  7.7 7.6 8.1 8.6  NEUTROABS 5.5  --    --   --   --   --   --   --   --   HGB 8.7*   < > 6.4*   < > 8.7* 7.6* 7.3* 8.2* 6.7*  HCT 28.6*   < > 20.6*   < > 27.4* 25.3* 23.4* 26.0* 21.8*  MCV 95.0   < > 94.9  --   --  94.8 91.8 91.5 92.8  PLT 187   < > 192  --   --  144* 177 190 152   < > = values in this interval not displayed.   Basic Metabolic Panel: Recent Labs  Lab 11/20/19 0614 11/21/19 0457 11/22/19 0437 11/23/19 0558 11/24/19 0538  NA 137 137 138 140 143  K 3.9 4.5 4.5 4.1 4.0  CL 110 108 110 111 112*  CO2 21* 24 23 24 25   GLUCOSE 140* 149* 109* 98 83  BUN 20 21 16 12 10   CREATININE 1.12* 1.08* 1.04* 1.11* 1.11*  CALCIUM 7.8* 8.3* 8.1* 7.8* 7.6*  MG 2.0  --   --   --   --   PHOS 1.9*  --   --   --   --    Liver Function Tests: No results for input(s): AST, ALT, ALKPHOS, BILITOT, PROT, ALBUMIN in the last 168 hours. Coagulation Profile: Recent Labs  Lab 11/23/19 2225  INR 1.2   HbA1C: No results for input(s): HGBA1C in the last 72 hours. CBG: Recent Labs  Lab 11/24/19 1228 11/24/19 1705 11/24/19 2141 11/25/19 0801 11/25/19 1151  GLUCAP 92 135* 139* 95 127*    No results found for this or any previous visit (from the past 240 hour(s)).   Radiology Studies: No results found. Vernell Leep, MD, [place], [facility]. [facility]  To contact the attending provider between 7A-7P or the covering provider during after hours 7P-7A, please log into the web site [email or web address] and access using universal [place] password for that web site. If you do not have the password, please call the hospital operator.

## 2019-11-25 NOTE — Progress Notes (Addendum)
CRITICAL VALUE ALERT  Critical Value:  Hgb 6.7  Date & Time Notied: 0518  11/25/2019  Provider Notified: M.Khan  Orders Received/Actions taken: 1 Unit Blood Transfusion

## 2019-11-26 ENCOUNTER — Inpatient Hospital Stay (HOSPITAL_COMMUNITY): Payer: Medicare HMO

## 2019-11-26 LAB — BASIC METABOLIC PANEL
Anion gap: 6 (ref 5–15)
BUN: 10 mg/dL (ref 8–23)
CO2: 24 mmol/L (ref 22–32)
Calcium: 7.6 mg/dL — ABNORMAL LOW (ref 8.9–10.3)
Chloride: 112 mmol/L — ABNORMAL HIGH (ref 98–111)
Creatinine, Ser: 1.17 mg/dL — ABNORMAL HIGH (ref 0.44–1.00)
GFR calc Af Amer: 56 mL/min — ABNORMAL LOW (ref >60)
GFR calc non Af Amer: 48 mL/min — ABNORMAL LOW (ref >60)
Glucose, Bld: 100 mg/dL — ABNORMAL HIGH (ref 70–99)
Potassium: 4.2 mmol/L (ref 3.5–5.1)
Sodium: 142 mmol/L (ref 135–145)

## 2019-11-26 LAB — CBC
HCT: 24.5 % — ABNORMAL LOW (ref 36.0–46.0)
HCT: 19.2 % — ABNORMAL LOW (ref 36.0–46.0)
Hemoglobin: 7.7 g/dL — ABNORMAL LOW (ref 12.0–15.0)
Hemoglobin: 6.1 g/dL — CL (ref 12.0–15.0)
MCH: 29.2 pg (ref 26.0–34.0)
MCH: 29.3 pg (ref 26.0–34.0)
MCHC: 31.4 g/dL (ref 30.0–36.0)
MCHC: 31.8 g/dL (ref 30.0–36.0)
MCV: 92.8 fL (ref 80.0–100.0)
MCV: 92.3 fL (ref 80.0–100.0)
Platelets: 142 10*3/uL — ABNORMAL LOW (ref 150–400)
Platelets: 143 10*3/uL — ABNORMAL LOW (ref 150–400)
RBC: 2.64 MIL/uL — ABNORMAL LOW (ref 3.87–5.11)
RBC: 2.08 MIL/uL — ABNORMAL LOW (ref 3.87–5.11)
RDW: 15.9 % — ABNORMAL HIGH (ref 11.5–15.5)
RDW: 15.9 % — ABNORMAL HIGH (ref 11.5–15.5)
WBC: 8 10*3/uL (ref 4.0–10.5)
WBC: 8.7 10*3/uL (ref 4.0–10.5)
nRBC: 0.4 % — ABNORMAL HIGH (ref 0.0–0.2)
nRBC: 0.2 % (ref 0.0–0.2)

## 2019-11-26 LAB — GLUCOSE, CAPILLARY
Glucose-Capillary: 111 mg/dL — ABNORMAL HIGH (ref 70–99)
Glucose-Capillary: 113 mg/dL — ABNORMAL HIGH (ref 70–99)
Glucose-Capillary: 128 mg/dL — ABNORMAL HIGH (ref 70–99)

## 2019-11-26 LAB — PREPARE RBC (CROSSMATCH)

## 2019-11-26 LAB — HEMOGLOBIN AND HEMATOCRIT, BLOOD
HCT: 26.1 % — ABNORMAL LOW (ref 36.0–46.0)
Hemoglobin: 8.2 g/dL — ABNORMAL LOW (ref 12.0–15.0)

## 2019-11-26 MED ORDER — PEG 3350-KCL-NA BICARB-NACL 420 G PO SOLR
4000.0000 mL | Freq: Once | ORAL | Status: AC
  Administered 2019-11-26: 4000 mL via ORAL

## 2019-11-26 MED ORDER — POLYETHYLENE GLYCOL 3350 17 GM/SCOOP PO POWD
1.0000 | Freq: Once | ORAL | Status: DC
  Filled 2019-11-26: qty 255

## 2019-11-26 MED ORDER — SODIUM CHLORIDE 0.9% IV SOLUTION: Freq: Once | INTRAVENOUS | Status: AC

## 2019-11-26 MED ORDER — SODIUM PERTECHNETATE TC 99M INJECTION
10.4000 | Freq: Once | INTRAVENOUS | Status: AC | PRN
  Administered 2019-11-26: 10.4 via INTRAVENOUS

## 2019-11-26 NOTE — Progress Notes (Addendum)
Patient had large bowel movement of maroon blood with clots.  Patient's daughter at bedside.  Patient asking if hemoglobin & hematocrit can be ordered.  Dr. Reesa Chew notified via text page.

## 2019-11-26 NOTE — Progress Notes (Signed)
CRITICAL VALUE ALERT  Critical Value:  Hgb 6.1  Date & Time Notied:  11/26/19 21:27  Provider Notified: M. Sharlet Salina APP & Dr. Annette Stable GI  Orders Received/Actions taken: Transfuse 2 units PRBCS

## 2019-11-26 NOTE — Progress Notes (Signed)
Patient has order for transfer to step-down unit.  No beds at this time. Dr. Reesa Chew notified.  Daughter and patient notified.  Verbalized understanding.

## 2019-11-26 NOTE — Progress Notes (Signed)
Due to ongoing lower gi bleed without unknown exact source we will move the patient to [facility].  Son has been updated. Discussed the case with the patient's RN.

## 2019-11-26 NOTE — Progress Notes (Signed)
PROGRESS NOTE    Sue [name]  TSV:[id number] DOB: [birth year] DOA: 11/13/2019 PCP: Ann Held, DO   Brief Narrative:  69 year old with history of DM2, HTN, COPD on 3 L nasal cannula, morbid obesity admitted to the hospital for bright red blood per rectum.  Ongoing off-and-on episodes since December status post multiple colonoscopies without identifiable source.  Admitted this time with hemoglobin of 5 requiring multiple units of PRBC transfusion.  Status post C-scope 3/13, EGD 3/14 capsule endoscopy 3/9, bleeding scan 3/13.  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  Plan repeat scope on 10/30/2019 per GI, if no identifiable source then might need transfer to [facility] for double-balloon enteroscopy   Assessment & Plan:   Principal Problem:   Lower GI bleed Active Problems:   HTN (hypertension)   Chronic respiratory failure ([place])   Morbid obesity due to excess calories ([place])   Diabetes mellitus type II, uncontrolled ([place])   CKD (chronic kidney disease) stage 3, GFR 30-59 ml/min   Acute blood loss anemia   GI bleed without any clear identifiable source, persistent Blood loss anemia, GI tract -Initial GI team has been following.  Status post C-scope 3/13, EGD 3/14 capsule endoscopy 3/9, bleeding scan 3/13.  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  Plans on repeating bleeding scan per GI, if no identifiable source then might need transfer to [facility] for double-balloon enteroscopy. -Received multiple transfusions in the hospital.  Has received about 13 units of transfusion, last transfusion 3/15.  Chronic hypoxic respiratory failure on 3 L nasal cannula -Continue supportive care.  Supplemental oxygen.  Essential hypertension -On metoprolol  DM 2, insulin-dependent -On Lantus and sliding scale  Chronic diastolic congestive  heart failure -Overall appears to be euvolemic, periodic diuretics with transfusions.  CKD stage IIIb -Baseline creatinine 1.5.  Hypothyroidism -Daily Synthroid  Morbid obesity with BMI greater than 47 -Eventually she can benefit from follow-up with bariatric surgery.  DVT prophylaxis: SCD small bowel capsule endoscopy/s Code Status: Full code Family Communication:   Disposition Plan:   Patient From= home  Patient Anticipated D/C place= to be determined  Barriers= still ongoing evaluation for recurrent GI bleed.  Maintain hospital stay, unsafe for discharge.  Requiring frequent transfusions   Subjective: Had some bright red blood per rectum yesterday evening but none this morning.  Review of Systems Otherwise negative except as per HPI, including: General: Denies fever, chills, night sweats or unintended weight loss. Resp: Denies cough, wheezing, shortness of breath. Cardiac: Denies chest pain, palpitations, orthopnea, paroxysmal nocturnal dyspnea. GI: Denies abdominal pain, nausea, vomiting, diarrhea or constipation GU: Denies dysuria, frequency, hesitancy or incontinence MS: Denies muscle aches, joint pain or swelling Neuro: Denies headache, neurologic deficits (focal weakness, numbness, tingling), abnormal gait Psych: Denies anxiety, depression, SI/HI/AVH Skin: Denies new rashes or lesions ID: Denies sick contacts, exotic exposures, travel  Examination:  General exam: Appears calm and comfortable, chronically 3 L nasal cannula.  Morbidly obese.  Very pleasant female. Respiratory system: Clear to auscultation. Respiratory effort normal. Cardiovascular system: S1 & S2 heard, RRR. No JVD, murmurs, rubs, gallops or clicks. No pedal edema. Gastrointestinal system: Abdomen is nondistended, soft and nontender. No organomegaly or masses felt. Normal bowel sounds heard. Central nervous system: Alert and oriented. No focal neurological deficits. Extremities: Symmetric 5 x 5  power. Skin: No rashes, lesions or ulcers Psychiatry: Judgement and  insight appear normal. Mood & affect appropriate.     Objective: Vitals:   11/25/19 2035 11/25/19 2346 11/26/19 0118 11/26/19 0525  BP: 126/71 130/71 (!) 103/57 115/65  Pulse: 86 81 94 85  Resp: (!) 22 (!) 21 19 20   Temp: 97.9 F (36.6 C) 97.8 F (36.6 C) 98 F (36.7 C) 98 F (36.7 C)  TempSrc: Oral Oral Oral Oral  SpO2: 100% 100% 100% 100%  Weight:      Height:        Intake/Output Summary (Last 24 hours) at 11/26/2019 0800 Last data filed at 11/26/2019 0527 Gross per 24 hour  Intake 435 ml  Output 350 ml  Net 85 ml   Filed Weights   11/13/19 0330 11/24/19 1036  Weight: 122.5 kg 122.5 kg     Data Reviewed:   CBC: Recent Labs  Lab 11/21/19 1923 11/22/19 0437 11/24/19 0538 11/24/19 0538 11/24/19 1453 11/24/19 2110 11/25/19 0444 11/26/19 0106 11/26/19 0600  WBC 9.6   < > 7.7  --  7.6 8.1 8.6  --  8.0  NEUTROABS 5.5  --   --   --   --   --   --   --   --   HGB 8.7*   < > 7.6*   < > 7.3* 8.2* 6.7* 8.2* 7.7*  HCT 28.6*   < > 25.3*   < > 23.4* 26.0* 21.8* 26.1* 24.5*  MCV 95.0   < > 94.8  --  91.8 91.5 92.8  --  92.8  PLT 187   < > 144*  --  177 190 152  --  142*   < > = values in this interval not displayed.   Basic Metabolic Panel: Recent Labs  Lab 11/20/19 0614 11/20/19 0614 11/21/19 0457 11/22/19 0437 11/23/19 0558 11/24/19 0538 11/26/19 0600  NA 137   < > 137 138 140 143 142  K 3.9   < > 4.5 4.5 4.1 4.0 4.2  CL 110   < > 108 110 111 112* 112*  CO2 21*   < > 24 23 24 25 24   GLUCOSE 140*   < > 149* 109* 98 83 100*  BUN 20   < > 21 16 12 10 10   CREATININE 1.12*   < > 1.08* 1.04* 1.11* 1.11* 1.17*  CALCIUM 7.8*   < > 8.3* 8.1* 7.8* 7.6* 7.6*  MG 2.0  --   --   --   --   --   --   PHOS 1.9*  --   --   --   --   --   --    < > = values in this interval not displayed.   GFR: Estimated Creatinine Clearance: 59.2 mL/min (A) (by C-G formula based on SCr of 1.17 mg/dL (H)). Liver  Function Tests: No results for input(s): AST, ALT, ALKPHOS, BILITOT, PROT, ALBUMIN in the last 168 hours. No results for input(s): LIPASE, AMYLASE in the last 168 hours. No results for input(s): AMMONIA in the last 168 hours. Coagulation Profile: Recent Labs  Lab 11/23/19 2225  INR 1.2   Cardiac Enzymes: No results for input(s): CKTOTAL, CKMB, CKMBINDEX, TROPONINI in the last 168 hours. BNP (last 3 results) No results for input(s): PROBNP in the last 8760 hours. HbA1C: No results for input(s): HGBA1C in the last 72 hours. CBG: Recent Labs  Lab 11/24/19 2141 11/25/19 0801 11/25/19 1151 11/25/19 1701 11/25/19 2216  GLUCAP 139* 95 127* 97 116*  Lipid Profile: No results for input(s): CHOL, HDL, LDLCALC, TRIG, CHOLHDL, LDLDIRECT in the last 72 hours. Thyroid Function Tests: No results for input(s): TSH, T4TOTAL, FREET4, T3FREE, THYROIDAB in the last 72 hours. Anemia Panel: No results for input(s): VITAMINB12, FOLATE, FERRITIN, TIBC, IRON, RETICCTPCT in the last 72 hours. Sepsis Labs: No results for input(s): PROCALCITON, LATICACIDVEN in the last 168 hours.  No results found for this or any previous visit (from the past 240 hour(s)).       Radiology Studies: No results found.      Scheduled Meds: . sodium chloride   Intravenous Once  . sodium chloride   Intravenous Once  . busPIRone  15 mg Oral Daily  . furosemide  20 mg Intravenous Once  . insulin aspart  0-15 Units Subcutaneous TID WC  . insulin aspart  0-5 Units Subcutaneous QHS  . insulin glargine  20 Units Subcutaneous Daily  . levothyroxine  50 mcg Oral Daily  . mouth rinse  15 mL Mouth Rinse BID  . metoprolol succinate  50 mg Oral Daily  . polyethylene glycol powder  1 Container Oral Once  . polyethylene glycol-electrolytes  4,000 mL Oral Once  . QUEtiapine  100 mg Oral QHS  . sodium chloride flush  10-40 mL Intracatheter Q12H  . zolpidem  5 mg Oral QHS   Continuous Infusions:   LOS: 13 days    Time spent= 35 mins    Stephonie Wilcoxen Arsenio Loader, MD [facility]  If 7PM-7AM, please contact night-coverage  11/26/2019, 8:00 AM

## 2019-11-26 NOTE — Progress Notes (Signed)
[facility] Gastroenterology Progress Note  Sue [name] 69 y.o. 09-10-1951   Subjective: Small amount of rectal bleeding overnight. Denies abdominal pain. Feels ok.  Objective: Vital signs: Vitals:   11/26/19 0118 11/26/19 0525  BP: (!) 103/57 115/65  Pulse: 94 85  Resp: 19 20  Temp: 98 F (36.7 C) 98 F (36.7 C)  SpO2: 100% 100%    Physical Exam: Gen: lethargic, no acute distress  HEENT: anicteric sclera CV: RRR Chest: CTA B Abd: soft, nontender, nondistended, obese, +BS Ext: no edema  Lab Results: Recent Labs    11/24/19 0538 11/26/19 0600  NA 143 142  K 4.0 4.2  CL 112* 112*  CO2 25 24  GLUCOSE 83 100*  BUN 10 10  CREATININE 1.11* 1.17*  CALCIUM 7.6* 7.6*   No results for input(s): AST, ALT, ALKPHOS, BILITOT, PROT, ALBUMIN in the last 72 hours. Recent Labs    11/25/19 0444 11/25/19 0444 11/26/19 0106 11/26/19 0600  WBC 8.6  --   --  8.0  HGB 6.7*   < > 8.2* 7.7*  HCT 21.8*   < > 26.1* 24.5*  MCV 92.8  --   --  92.8  PLT 152  --   --  142*   < > = values in this interval not displayed.      Assessment/Plan: Obscure GI bleeding - Small bowel capsule showed blood in proximal small bowel with AVMs but question of scope trauma from enteroscopy. No blood in distal small bowel until near ICV and large amount of dark red stool in colon. I had planned to do a 2 day prep to repeat a colonoscopy tomorrow but for unclear reasons she did not receive the Miralax prep yesterday. Cancelled NuLytely for today and will do a Meckel's scan to look for a Meckel's diverticulum that could be causing her recurrent bleeding especially with bleeding scan showing active bleeding in proximal small bowel (11/23/19). If Meckel's scan is negative, then will repeat another colonoscopy.   [name] 11/26/2019, 9:24 AM  Questions please call [phone] ID: Sue [name], female   DOB: 21-Feb-1951, 69 y.o.   MRN: [id number]

## 2019-11-26 NOTE — Progress Notes (Signed)
Patient had a bowel movement this afternoon with maroon colored stool.  Clots in bowel movement.

## 2019-11-27 ENCOUNTER — Inpatient Hospital Stay (HOSPITAL_COMMUNITY): Payer: Medicare HMO | Admitting: Anesthesiology

## 2019-11-27 ENCOUNTER — Encounter (HOSPITAL_COMMUNITY)
Admission: EM | Disposition: A | Discharge status: Home Health | Payer: Self-pay | Source: Home / Self Care | Attending: Internal Medicine

## 2019-11-27 ENCOUNTER — Inpatient Hospital Stay (HOSPITAL_COMMUNITY): Payer: Medicare HMO

## 2019-11-27 DIAGNOSIS — N1832 Chronic kidney disease, stage 3b: Secondary | ICD-10-CM

## 2019-11-27 DIAGNOSIS — R0789 Other chest pain: Secondary | ICD-10-CM

## 2019-11-27 HISTORY — PX: HOT HEMOSTASIS: SHX5433

## 2019-11-27 HISTORY — PX: ENTEROSCOPY: SHX5533

## 2019-11-27 HISTORY — PX: ESOPHAGOGASTRODUODENOSCOPY: SHX5428

## 2019-11-27 LAB — CBC
HCT: 23.5 % — ABNORMAL LOW (ref 36.0–46.0)
Hemoglobin: 8 g/dL — ABNORMAL LOW (ref 12.0–15.0)
MCH: 30.3 pg (ref 26.0–34.0)
MCHC: 34 g/dL (ref 30.0–36.0)
MCV: 89 fL (ref 80.0–100.0)
Platelets: 118 10*3/uL — ABNORMAL LOW (ref 150–400)
RBC: 2.64 MIL/uL — ABNORMAL LOW (ref 3.87–5.11)
RDW: 14.5 % (ref 11.5–15.5)
WBC: 11.4 10*3/uL — ABNORMAL HIGH (ref 4.0–10.5)
nRBC: 0.9 % — ABNORMAL HIGH (ref 0.0–0.2)

## 2019-11-27 LAB — HEMOGLOBIN AND HEMATOCRIT, BLOOD
HCT: 20.3 % — ABNORMAL LOW (ref 36.0–46.0)
Hemoglobin: 6.7 g/dL — CL (ref 12.0–15.0)

## 2019-11-27 LAB — HEPATIC FUNCTION PANEL
ALT: 10 U/L (ref 0–44)
AST: 14 U/L — ABNORMAL LOW (ref 15–41)
Albumin: 1.8 g/dL — ABNORMAL LOW (ref 3.5–5.0)
Alkaline Phosphatase: 69 U/L (ref 38–126)
Bilirubin, Direct: 0.1 mg/dL (ref 0.0–0.2)
Indirect Bilirubin: 0.4 mg/dL (ref 0.3–0.9)
Total Bilirubin: 0.5 mg/dL (ref 0.3–1.2)
Total Protein: 4 g/dL — ABNORMAL LOW (ref 6.5–8.1)

## 2019-11-27 LAB — MAGNESIUM: Magnesium: 1.7 mg/dL (ref 1.7–2.4)

## 2019-11-27 LAB — BASIC METABOLIC PANEL
Anion gap: 4 — ABNORMAL LOW (ref 5–15)
BUN: 17 mg/dL (ref 8–23)
CO2: 24 mmol/L (ref 22–32)
Calcium: 7.2 mg/dL — ABNORMAL LOW (ref 8.9–10.3)
Chloride: 112 mmol/L — ABNORMAL HIGH (ref 98–111)
Creatinine, Ser: 1.36 mg/dL — ABNORMAL HIGH (ref 0.44–1.00)
GFR calc Af Amer: 47 mL/min — ABNORMAL LOW (ref >60)
GFR calc non Af Amer: 40 mL/min — ABNORMAL LOW (ref >60)
Glucose, Bld: 171 mg/dL — ABNORMAL HIGH (ref 70–99)
Potassium: 4.8 mmol/L (ref 3.5–5.1)
Sodium: 140 mmol/L (ref 135–145)

## 2019-11-27 LAB — GLUCOSE, CAPILLARY
Glucose-Capillary: 161 mg/dL — ABNORMAL HIGH (ref 70–99)
Glucose-Capillary: 148 mg/dL — ABNORMAL HIGH (ref 70–99)
Glucose-Capillary: 132 mg/dL — ABNORMAL HIGH (ref 70–99)
Glucose-Capillary: 119 mg/dL — ABNORMAL HIGH (ref 70–99)

## 2019-11-27 LAB — PREPARE RBC (CROSSMATCH)

## 2019-11-27 LAB — TROPONIN I (HIGH SENSITIVITY): Troponin I (High Sensitivity): <2 ng/L (ref <18)

## 2019-11-27 SURGERY — COLONOSCOPY WITH PROPOFOL
Anesthesia: Monitor Anesthesia Care

## 2019-11-27 SURGERY — EGD (ESOPHAGOGASTRODUODENOSCOPY)
Anesthesia: Monitor Anesthesia Care

## 2019-11-27 MED ORDER — LACTATED RINGERS IV SOLN
INTRAVENOUS | Status: DC
  Administered 2019-11-27: 13:00:00 1000 mL via INTRAVENOUS

## 2019-11-27 MED ORDER — PHENYLEPHRINE HCL (PRESSORS) 10 MG/ML IV SOLN
INTRAVENOUS | Status: DC | PRN
  Administered 2019-11-27 (×2): 120 ug via INTRAVENOUS
  Administered 2019-11-27 (×3): 80 ug via INTRAVENOUS

## 2019-11-27 MED ORDER — PROPOFOL 500 MG/50ML IV EMUL
INTRAVENOUS | Status: AC
  Filled 2019-11-27: qty 100

## 2019-11-27 MED ORDER — PROPOFOL 500 MG/50ML IV EMUL
INTRAVENOUS | Status: DC | PRN
  Administered 2019-11-27: 100 ug/kg/min via INTRAVENOUS

## 2019-11-27 MED ORDER — BOOST / RESOURCE BREEZE PO LIQD CUSTOM
1.0000 | Freq: Two times a day (BID) | ORAL | Status: DC
  Administered 2019-11-30: 1 via ORAL
  Filled 2019-11-27 (×3): qty 1

## 2019-11-27 MED ORDER — SODIUM CHLORIDE 0.9% IV SOLUTION: Freq: Once | INTRAVENOUS | Status: DC

## 2019-11-27 MED ORDER — PRO-STAT SUGAR FREE PO LIQD
30.0000 mL | Freq: Two times a day (BID) | ORAL | Status: DC
  Administered 2019-11-29 – 2019-12-01 (×3): 30 mL via ORAL
  Filled 2019-11-27 (×6): qty 30

## 2019-11-27 MED ORDER — ADULT MULTIVITAMIN W/MINERALS CH
1.0000 | ORAL_TABLET | Freq: Every day | ORAL | Status: DC
  Administered 2019-11-28 – 2019-12-02 (×5): 1 via ORAL
  Filled 2019-11-27 (×6): qty 1

## 2019-11-27 MED ORDER — PROPOFOL 1000 MG/100ML IV EMUL
INTRAVENOUS | Status: AC
  Filled 2019-11-27: qty 100

## 2019-11-27 MED ORDER — ONDANSETRON HCL 4 MG/2ML IJ SOLN
INTRAMUSCULAR | Status: DC | PRN
  Administered 2019-11-27: 4 mg via INTRAVENOUS

## 2019-11-27 MED ORDER — LIDOCAINE HCL (CARDIAC) PF 100 MG/5ML IV SOSY
PREFILLED_SYRINGE | INTRAVENOUS | Status: DC | PRN
  Administered 2019-11-27: 60 mg via INTRATRACHEAL

## 2019-11-27 MED ORDER — PEG 3350-KCL-NA BICARB-NACL 420 G PO SOLR
4000.0000 mL | Freq: Once | ORAL | Status: AC
  Administered 2019-11-27: 4000 mL via ORAL

## 2019-11-27 MED ORDER — PROPOFOL 500 MG/50ML IV EMUL
INTRAVENOUS | Status: DC | PRN
  Administered 2019-11-27: 40 mg via INTRAVENOUS

## 2019-11-27 MED ORDER — SODIUM CHLORIDE 0.9 % IV SOLN: INTRAVENOUS | Status: DC

## 2019-11-27 MED ORDER — PROPOFOL 10 MG/ML IV BOLUS
INTRAVENOUS | Status: AC
  Filled 2019-11-27: qty 20

## 2019-11-27 NOTE — Op Note (Signed)
[facility] Patient Name: Sue [name] Procedure Date: 11/27/2019 MRN: [id number] Attending MD: [name] , MD Date of Birth: March 04, 1951 CSN: [id number] Age: 69 Admit Type: Inpatient Procedure:                Small bowel enteroscopy Indications:              GI bleeding source not documented by previous UGI                            endoscopy, GI bleeding source not documented by                            previous colonoscopy, GI bleeding source not                            documented by previous video capsule endoscopy, GI                            bleeding source not documented by previous                            enteroscopy, Obscure gastrointestinal bleeding Providers:                [name], MD, William Dalton,                            Technician, Truddie Coco, RN Referring MD:             hospital team Medicines:                Propofol per Anesthesia, Monitored Anesthesia Care Complications:            No immediate complications. Estimated Blood Loss:     Estimated blood loss: none. Procedure:                Pre-Anesthesia Assessment:                           - Prior to the procedure, a History and Physical                            was performed, and patient medications and                            allergies were reviewed. The patient's tolerance of                            previous anesthesia was also reviewed. The risks                            and benefits of the procedure and the sedation                            options and risks were discussed with the patient.  All questions were answered, and informed consent                            was obtained. Prior Anticoagulants: The patient has                            taken no previous anticoagulant or antiplatelet                            agents. ASA Grade Assessment: III - A patient with                            severe systemic disease.  After reviewing the risks                            and benefits, the patient was deemed in                            satisfactory condition to undergo the procedure.                           After obtaining informed consent, the endoscope was                            passed under direct vision. Throughout the                            procedure, the patient's blood pressure, pulse, and                            oxygen saturations were monitored continuously. The                            [id number] ([id number]) Olympus enteroscope was                            introduced through the mouth and advanced to the                            second part of duodenum. The [id number]-[id number] ([id number])                            Olympus pediatric colonscope was introduced through                            the mouth and advanced to the proximal jejunum. The                            small bowel enteroscopy was performed with                            difficulty due to significant looping. Successful  completion of the procedure was aided by                            withdrawing the scope and replacing with the                            pediatric colonoscope. The patient tolerated the                            procedure well. Scope In: Scope Out: Findings:      The examined esophagus was normal.      The Z-line was found 42 cm from the incisors.      Segmental mild inflammation characterized by linear erosions was found       on the greater curvature of the stomach.      The cardia and gastric fundus were normal on retroflexion.      Two angiodysplastic lesions with no bleeding were found in the second       portion of the duodenum. Fulguration to ablate the lesion by argon       plasma was successful. Estimated blood loss: none.      A single angiodysplastic lesion with no bleeding was found in the third       portion of the duodenum. Fulguration to ablate the lesion by  argon       plasma was successful. Estimated blood loss: none.      A single angiodysplastic lesion with no bleeding was found in the       proximal jejunum. Fulguration to ablate the lesion by argon plasma was       successful. Estimated blood loss: none. Impression:               - Normal esophagus.                           - Z-line, 42 cm from the incisors.                           - Acute gastritis.                           - Two non-bleeding angiodysplastic lesions in the                            duodenum. Treated with argon plasma coagulation                            (APC).                           - A single non-bleeding angiodysplastic lesion in                            the duodenum. Treated with argon plasma coagulation                            (APC).                           -  A single non-bleeding angiodysplastic lesion in                            the jejunum. Treated with argon plasma coagulation                            (APC).                           - No specimens collected. Recommendation:           - Capsule endoscopy tomorrow and repeat colonoscopy                            on 11/29/19. If no source found then may need a deep                            small bowel enteroscopy at a tertiary care center. Procedure Code(s):        --- Professional ---                           [id number], Small intestinal endoscopy, enteroscopy                            beyond second portion of duodenum, not including                            ileum; with ablation of tumor(s), polyp(s), or                            other lesion(s) not amenable to removal by hot                            biopsy forceps, bipolar cautery or snare technique Diagnosis Code(s):        --- Professional ---                           K92.2, Gastrointestinal hemorrhage, unspecified                           K31.819, Angiodysplasia of stomach and duodenum                            without  bleeding                           K29.00, Acute gastritis without bleeding                           K55.20, Angiodysplasia of colon without hemorrhage CPT copyright 2019 [employer]. All rights reserved. The codes documented in this report are preliminary and upon coder review may  be revised to meet current compliance requirements. [name], MD 11/27/2019 2:08:16 PM This report has been signed electronically. Number of Addenda: 0

## 2019-11-27 NOTE — Anesthesia Preprocedure Evaluation (Addendum)
Anesthesia Evaluation  Patient identified by MRN, date of birth, ID band Patient awake    Reviewed: Allergy & Precautions, NPO status , Patient's Chart, lab work & pertinent test results  History of Anesthesia Complications Negative for: history of anesthetic complications  Airway Mallampati: III  TM Distance: >3 FB Neck ROM: Full    Dental  (+) Dental Advisory Given   Pulmonary COPD,  COPD inhaler and oxygen dependent, former smoker (quit 2013),     + decreased breath sounds(-) wheezing      Cardiovascular hypertension, Pt. on medications and Pt. on home beta blockers + DOE   Rhythm:Regular Rate:Normal  Sharp chest pain last night eval by hospitalist, Dr, Lonny Prude, and Dr. Michail Sermon, thought to be non-cardiac   '18 ECHO: EF 65-70%, LVH, valves OK   Neuro/Psych Anxiety Depression TIA   GI/Hepatic GERD  Medicated,  Endo/Other  diabetes (glu 171), Oral Hypoglycemic Agents, Insulin DependentHypothyroidism Morbid obesity  Renal/GU Renal InsufficiencyRenal disease (creat 1.36)     Musculoskeletal   Abdominal   Peds  Hematology  (+) Blood dyscrasia (GI bleed: Hb 8.0), anemia ,   Anesthesia Other Findings   Reproductive/Obstetrics                           Anesthesia Physical Anesthesia Plan  ASA: III  Anesthesia Plan: MAC   Post-op Pain Management:    Induction:   PONV Risk Score and Plan: 2 and Treatment may vary due to age or medical condition, Ondansetron, Propofol infusion and TIVA  Airway Management Planned: Natural Airway and Nasal Cannula  Additional Equipment:   Intra-op Plan:   Post-operative Plan:   Informed Consent: I have reviewed the patients History and Physical, chart, labs and discussed the procedure including the risks, benefits and alternatives for the proposed anesthesia with the patient or authorized representative who has indicated his/her understanding and  acceptance.     Dental advisory given  Plan Discussed with: CRNA  Anesthesia Plan Comments:        Anesthesia Quick Evaluation                                  Anesthesia Evaluation  Patient identified by MRN, date of birth, ID band Patient awake    Reviewed: Allergy & Precautions, NPO status , Patient's Chart, lab work & pertinent test results  Airway Mallampati: II  TM Distance: >3 FB Neck ROM: Full    Dental no notable dental hx. (+) Teeth Intact, Dental Advisory Given   Pulmonary neg pulmonary ROS, former smoker,    Pulmonary exam normal breath sounds clear to auscultation       Cardiovascular hypertension, + DOE  Normal cardiovascular exam Rhythm:Regular Rate:Normal     Neuro/Psych    GI/Hepatic GERD  ,(+) Hepatitis -  Endo/Other  diabetesHypothyroidism   Renal/GU K+ 4.0 Cr 1.11     Musculoskeletal negative musculoskeletal ROS (+)   Abdominal (+) + obese,   Peds  Hematology  (+) anemia , hgb 7.6   Anesthesia Other Findings   Reproductive/Obstetrics negative OB ROS                            Anesthesia Physical Anesthesia Plan  ASA: III  Anesthesia Plan: MAC   Post-op Pain Management:    Induction:   PONV Risk Score and Plan: 2 and  Treatment may vary due to age or medical condition, Ondansetron and Propofol infusion  Airway Management Planned: Natural Airway and Simple Face Mask  Additional Equipment: None  Intra-op Plan:   Post-operative Plan:   Informed Consent: I have reviewed the patients History and Physical, chart, labs and discussed the procedure including the risks, benefits and alternatives for the proposed anesthesia with the patient or authorized representative who has indicated his/her understanding and acceptance.     Dental advisory given  Plan Discussed with: CRNA  Anesthesia Plan Comments: (EGD for GI bleed s/p Colonoscopy 3/13)       Anesthesia Quick  Evaluation

## 2019-11-27 NOTE — Progress Notes (Signed)
[name] Gastroenterology Progress Note  Sue [name] 69 y.o. 14-Aug-1951   Subjective: Rectal bleeding overnight. Complaining of chest pressure and shortness of breath last night and this morning. Feels very weak.  Objective: Vital signs: Vitals:   11/27/19 0302 11/27/19 0638  BP: 101/69 115/73  Pulse: (!) 103 (!) 106  Resp: 20 20  Temp: 98.1 F (36.7 C) 98.4 F (36.9 C)  SpO2: 100% 100%    Physical Exam: Gen: lethargic, no acute distress  HEENT: anicteric sclera CV: RRR Chest: CTA B Abd: soft, nontender, nondistended, +BS   Lab Results: Recent Labs    11/26/19 0600  NA 142  K 4.2  CL 112*  CO2 24  GLUCOSE 100*  BUN 10  CREATININE 1.17*  CALCIUM 7.6*   No results for input(s): AST, ALT, ALKPHOS, BILITOT, PROT, ALBUMIN in the last 72 hours. Recent Labs    11/26/19 0600 11/26/19 2029  WBC 8.0 8.7  HGB 7.7* 6.1*  HCT 24.5* 19.2*  MCV 92.8 92.3  PLT 142* 143*      Assessment/Plan: Obscure GI bleeding - recurrent rectal bleeding of unclear etiology. Hgb. 6.1 yesterday. Chest pressure and shortness of breath overnight and this morning probably related to her anemia. S/P blood transfusion this morning. Patient did not drink colon prep and wants to be transferred to a tertiary care facility. Have talked with [facility] for possible transfer and awaiting call back from GI doc there. Dr. Lonny Prude aware and he will evaluate SOB and chest pressure. Updated daughter by phone.   [name] 11/27/2019, 8:49 AM  Questions please call [phone] ID: Sue [name], female   DOB: 15-Dec-1950, 69 y.o.   MRN: [id number]

## 2019-11-27 NOTE — Brief Op Note (Signed)
Four nonbleeding angiodysplasias seen in the proximal small bowel on the enteroscopy that were fulgurated. No active bleeding or blood products seen. See endopro note for details. Capsule endoscopy repeat tomorrow and repeat colonoscopy 11/29/19. Colon prep this evening and repeat tomorrow afternoon. Updated [facility] and will cancel possible transfer for now but if repeat capsule and colonoscopy unrevealing and bleeding continues then will need to re-address possible transfer to [place] on 11/29/19 for deep small bowel enteroscopy. D/W Dr. Lonny Prude. Updated daughter by phone.

## 2019-11-27 NOTE — Anesthesia Procedure Notes (Signed)
Date/Time: 11/27/2019 1:09 PM Performed by: Glory Buff, CRNA Oxygen Delivery Method: Simple face mask

## 2019-11-27 NOTE — Progress Notes (Signed)
Initial Nutrition Assessment  RD working remotely.  DOCUMENTATION CODES:   Morbid obesity  INTERVENTION:  - will order Boost Breeze TID, each supplement provides 250 kcal and 9 grams of protein. - will order 30 mL Prostat BID, each supplement provides 100 kcal and 15 grams of protein. - will order daily multivitamin with minerals.    NUTRITION DIAGNOSIS:   Inadequate oral intake related to acute illness as evidenced by other (comment)(frequent diet changes and periods of NPO status.).  GOAL:   Patient will meet greater than or equal to 90% of their needs  MONITOR:   Diet advancement, PO intake, Supplement acceptance, Labs, Weight trends  REASON FOR ASSESSMENT:   LOS(day #14)  ASSESSMENT:   69 y.o. female with a prolonged hospitalization 2/2 continued GIB of unknown source. Patient has a history of type 2 DM, HTN, COPD on 3L, and morbid obesity. She presented to the ED due to BRBPR and severe anemia. She has had multiple endoscopy procedures to attempt to localize the source of bleeding without success to date. She continues to require blood transfusions regularly for recurrent/ongoing GIB.   Patient has had multiple diet orders between CLD, FLD, and Carb Modified and multiple days/periods of NPO since admission on 3/3. She is currently out of the room in Endoscopy. She was on a CLD on 3/15 and then changed to NPO yesterday at 1946. The last documented intakes were 100% of breakfast and 100% of lunch on 3/10 (CLD).  Per chart review, weight on 3/3 and 3/14 was 270 lb and patient has not been weighed since 3/14.  Per notes: - GIB with unknown source-- s/p multiple endoscopies (colonoscopy, EGD, capsule endoscopy) - push enteroscopy today (3/17) - acute blood loss anemia - chest pain s/p EKG, CXR, and hepatic function panel - chonic respiratory failure with hypoxia   Labs reviewed; CBGs: 161 and 148 mg/dl, Cl: 112 mmol/l, creatinine: 1.36 mg/dl, Ca: 7.2 mg/dl. Medications  reviewed; 20 mg IV lasix x1 dose 3/15, sliding scale novolog, 20 units lantus/day, 50 mcg oral synthroid/day. IVF; NS @ 20 ml/hr.    NUTRITION - FOCUSED PHYSICAL EXAM:  unable to complete as patient is out of the room.  Diet Order:   Diet Order            Diet NPO time specified  Diet effective now              EDUCATION NEEDS:   No education needs have been identified at this time  Skin:  Skin Assessment: Skin Integrity Issues: Skin Integrity Issues:: Other (Comment) Other: R buttocks partial thickness wound  Last BM:  3/17  Height:   Ht Readings from Last 1 Encounters:  11/24/19 5\' 3"  (1.6 m)    Weight:   Wt Readings from Last 1 Encounters:  11/24/19 122.5 kg    Ideal Body Weight:  52.3 kg  BMI:  Body mass index is 47.83 kg/m.  Estimated Nutritional Needs:   Kcal:  1900-2100 kcal  Protein:  100-115 grams  Fluid:  >/= 2.2 L/day    Jarome Matin, MS, RD, LDN, CNSC Inpatient Clinical Dietitian RD pager # available in AMION  After hours/weekend pager # available in [facility]

## 2019-11-27 NOTE — H&P (View-Only) (Signed)
Patient ID: Sue [name], female   DOB: [birth year], 69 y.o.   MRN: [id number]  Spoke with [place] doctor and they are recommending [place] doing a repeat push enteroscopy and repeat capsule endoscopy. I also think she needs an updated colonoscopy if push enteroscopy is unrevealing. Spoke with Sue [name] and she is agreeable to do procedures and also states that she is willing to drink additional colon prep if necessary. Plan for push enteroscopy if cardiac workup ordered by Dr. Lonny Prude is unrevealing. Updated daughter again by phone with our plans.

## 2019-11-27 NOTE — Progress Notes (Signed)
PROGRESS NOTE    Sue [name]  YTK:[id number] DOB: Jan 20, 1951 DOA: 11/13/2019 PCP: Ann Held, DO   Brief Narrative: Sue [name] is a 69 y.o. female with a prolonged hospitalization secondary to continued GI bleeding of unknown source. Patient has a history of DM2, HTN, COPD on 3 L nasal cannula, morbid obesity. Patient presented secondary to bright red blood per rectum and severe anemia. She has had multiple endoscopy procedures to attempt to localize the source of bleeding without success to date. She continues to require blood transfusions regularly for recurrent/ongoing GI bleeding.   Assessment & Plan:   Principal Problem:   Lower GI bleed Active Problems:   HTN (hypertension)   Chronic respiratory failure (HCC)   Morbid obesity due to excess calories (HCC)   Diabetes mellitus type II, uncontrolled (HCC)   CKD (chronic kidney disease) stage 3, GFR 30-59 ml/min   Acute blood loss anemia   GI bleed Unknown source. [facility] on board. Patient has had multiple endoscopies including colonoscopy, EGD, capsule endoscopy without revelation of source. She has also had a bleeding scan which was unrevealing for source. To date, she has received 15 units of PRBC. Most recently, recurrent anemia overnight requiring 2 units. -GI recommendations: Plan for push enteroscopy today -H&H this afternoon and CBC in AM  Acute blood loss anemia Secondary to above.  Chest pain Atypical. EKG obtained overnight sinus rhythm with some mild lateral lead t-wave flattening which appears unchanged from earlier this month. Chest discomfort resolved with adjustment of nasal canula (previous device was not delivering oxygen correctly). Chest x-ray obtained and is unremarkable for acute process. Hepatic function panel was negative.  Chronic respiratory failure with hypoxia Patient is on 3L via nasal canula.  CKD stage IIIb Baseline creatinine of 1.5 per chart review.  Essential  hypertension -Continue metoprolol XL 50 mg daily  Diabetes mellitus, type 2 -Continue Lantus and SSI  Chronic diastolic heart failure Euvolemic. Chest x-ray clear today.  Depression Anxiety Continue Buspar, Seroquel  Hypothyroidism -Continue Synthroid  Morbid obesity Body mass index is 47.83 kg/m.   DVT prophylaxis: SCDs Code Status:   Code Status: Full Code Family Communication: None at bedside Disposition Plan: Discharge pending continued management of active GI bleeding   Consultants:   [facility]  Procedures:   COLONOSCOPY (3/9, 3/12, 3/13)  EGD (3/14)  Antimicrobials:  None    Subjective: Patient reports substernal chest pain described as sharp and also tightening. No associated coughing or radiation. She does report dyspnea. EKG obtained overnight was significant for normal sinus rhythm and no concerning EKG changes. Patient reports a family history of heart attack including a sister. She reports no history of heart attack. Per chart history, patient does have a history of TIA. Last Transthoracic Echocardiogram significant for LVH.  Objective: Vitals:   11/27/19 0236 11/27/19 0302 11/27/19 0638 11/27/19 0932  BP: 109/66 101/69 115/73 110/70  Pulse: (!) 102 (!) 103 (!) 106 (!) 109  Resp: 20 20 20 20   Temp: 98 F (36.7 C) 98.1 F (36.7 C) 98.4 F (36.9 C) 98 F (36.7 C)  TempSrc: Oral Oral Oral Oral  SpO2: 100% 100% 100% 95%  Weight:      Height:        Intake/Output Summary (Last 24 hours) at 11/27/2019 0948 Last data filed at 11/27/2019 7517 Gross per 24 hour  Intake 1389.87 ml  Output 900 ml  Net 489.87 ml   Filed Weights   11/13/19 0330 11/24/19  1036  Weight: 122.5 kg 122.5 kg    Examination:  General exam: Appears calm and comfortable Respiratory system: Clear to auscultation. Respiratory effort normal. Cardiovascular system: S1 & S2 heard, RRR. No murmurs, rubs, gallops or clicks. Gastrointestinal system: Abdomen is  nondistended, soft and nontender. No organomegaly or masses felt. Normal bowel sounds heard. Central nervous system: Alert and oriented. No focal neurological deficits. Extremities: 1+ edema. No calf tenderness Skin: No cyanosis. No rashes Psychiatry: Judgement and insight appear normal. Mood & affect appropriate.     Data Reviewed: I have personally reviewed following labs and imaging studies  CBC: Recent Labs  Lab 11/21/19 1923 11/22/19 0437 11/24/19 2110 11/24/19 2110 11/25/19 0444 11/26/19 0106 11/26/19 0600 11/26/19 2029 11/27/19 0842  WBC 9.6   < > 8.1  --  8.6  --  8.0 8.7 11.4*  NEUTROABS 5.5  --   --   --   --   --   --   --   --   HGB 8.7*   < > 8.2*   < > 6.7* 8.2* 7.7* 6.1* 8.0*  HCT 28.6*   < > 26.0*   < > 21.8* 26.1* 24.5* 19.2* 23.5*  MCV 95.0   < > 91.5  --  92.8  --  92.8 92.3 89.0  PLT 187   < > 190  --  152  --  142* 143* 118*   < > = values in this interval not displayed.   Basic Metabolic Panel: Recent Labs  Lab 11/22/19 0437 11/23/19 0558 11/24/19 0538 11/26/19 0600 11/27/19 0842  NA 138 140 143 142 140  K 4.5 4.1 4.0 4.2 4.8  CL 110 111 112* 112* 112*  CO2 23 24 25 24 24   GLUCOSE 109* 98 83 100* 171*  BUN 16 12 10 10 17   CREATININE 1.04* 1.11* 1.11* 1.17* 1.36*  CALCIUM 8.1* 7.8* 7.6* 7.6* 7.2*  MG  --   --   --   --  1.7   GFR: Estimated Creatinine Clearance: 50.9 mL/min (A) (by C-G formula based on SCr of 1.36 mg/dL (H)). Liver Function Tests: No results for input(s): AST, ALT, ALKPHOS, BILITOT, PROT, ALBUMIN in the last 168 hours. No results for input(s): LIPASE, AMYLASE in the last 168 hours. No results for input(s): AMMONIA in the last 168 hours. Coagulation Profile: Recent Labs  Lab 11/23/19 2225  INR 1.2   Cardiac Enzymes: No results for input(s): CKTOTAL, CKMB, CKMBINDEX, TROPONINI in the last 168 hours. BNP (last 3 results) No results for input(s): PROBNP in the last 8760 hours. HbA1C: No results for input(s): HGBA1C in  the last 72 hours. CBG: Recent Labs  Lab 11/25/19 2216 11/26/19 0958 11/26/19 1617 11/26/19 2136 11/27/19 0822  GLUCAP 116* 111* 113* 128* 161*   Lipid Profile: No results for input(s): CHOL, HDL, LDLCALC, TRIG, CHOLHDL, LDLDIRECT in the last 72 hours. Thyroid Function Tests: No results for input(s): TSH, T4TOTAL, FREET4, T3FREE, THYROIDAB in the last 72 hours. Anemia Panel: No results for input(s): VITAMINB12, FOLATE, FERRITIN, TIBC, IRON, RETICCTPCT in the last 72 hours. Sepsis Labs: No results for input(s): PROCALCITON, LATICACIDVEN in the last 168 hours.  No results found for this or any previous visit (from the past 240 hour(s)).       Radiology Studies: NM Bowel Img Meckels  Result Date: 11/26/2019 CLINICAL DATA:  Gastrointestinal bleeding. Concern for Meckel's diverticulum. EXAM: NUCLEAR MEDICINE MECKELS SCAN TECHNIQUE: Sequential abdominal images were obtained following intravenous injection of radiopharmaceutical. RADIOPHARMACEUTICALS:  53.2 millicuries technetium pertechnetate COMPARISON:  Tagged red blood cell bleeding scan 11/23/2019 FINDINGS: No accumulation of radiotracer in the abdomen or pelvis to localize ectopic gastric mucosa. Physiologic activity localizing to the stomach as expected. Physiologic activity noted in the GU tract. IMPRESSION: No evidence of ectopic gastric mucosa to localize Meckel's diverticulum. Electronically Signed   By: Suzy Bouchard M.D.   On: 11/26/2019 16:07        Scheduled Meds: . sodium chloride   Intravenous Once  . sodium chloride   Intravenous Once  . busPIRone  15 mg Oral Daily  . furosemide  20 mg Intravenous Once  . insulin aspart  0-15 Units Subcutaneous TID WC  . insulin aspart  0-5 Units Subcutaneous QHS  . insulin glargine  20 Units Subcutaneous Daily  . levothyroxine  50 mcg Oral Daily  . mouth rinse  15 mL Mouth Rinse BID  . metoprolol succinate  50 mg Oral Daily  . QUEtiapine  100 mg Oral QHS  . sodium  chloride flush  10-40 mL Intracatheter Q12H  . zolpidem  5 mg Oral QHS   Continuous Infusions:   LOS: 14 days     Cordelia Poche, MD [facility] 11/27/2019, 9:48 AM  If 7PM-7AM, please contact night-coverage [email or web address]

## 2019-11-27 NOTE — Interval H&P Note (Signed)
History and Physical Interval Note:  11/27/2019 12:38 PM  Sue [name]  has presented today for surgery, with the diagnosis of GIB.  The various methods of treatment have been discussed with the patient and family. After consideration of risks, benefits and other options for treatment, the patient has consented to  Procedure(s): ESOPHAGOGASTRODUODENOSCOPY (EGD) (N/A) ENTEROSCOPY (N/A) as a surgical intervention.  The patient's history has been reviewed, patient examined, no change in status, stable for surgery.  I have reviewed the patient's chart and labs.  Questions were answered to the patient's satisfaction.     [name]

## 2019-11-27 NOTE — Progress Notes (Signed)
Patient ID: Sue [name], female   DOB: 31-Jul-1951, 69 y.o.   MRN: [id number]  Spoke with [facility] doctor and they are recommending [place] doing a repeat push enteroscopy and repeat capsule endoscopy. I also think she needs an updated colonoscopy if push enteroscopy is unrevealing. Spoke with [name] and she is agreeable to do procedures and also states that she is willing to drink additional colon prep if necessary. Plan for push enteroscopy if cardiac workup ordered by Dr. Lonny Prude is unrevealing. Updated daughter again by phone with our plans.

## 2019-11-27 NOTE — Progress Notes (Signed)
Asked to take a look at pt per [facility] r/t pt family having concerns about pt care.  Pt daughter at bedside very appropriate helping her Mom to getting cleaned up after a dark bloody stool.  (Tech at bedside also).  Pt denies pain at this time.  Pt receiving a unit of blood.  Pt VS stable see flow sheet. SR on monitor.  EKG complete.  Oriented x 4. Follows commands.  However, trying to get some sleep she says.  Pt doesn't appear to be in any distress at this time.  Pt is to receive a follow up H and H post transfusion per orders.  Pt is concerned and will need further evaluation r/t blood loss.

## 2019-11-27 NOTE — Progress Notes (Signed)
Pt c/o SOB and chest pain 7/10 sharp mid, right side of chest near breat. Pt awake, but sleepy. See VS. Gave 2mg  IV morphine. Notified [facility], new orders for 12 lead EKG.

## 2019-11-27 NOTE — Transfer of Care (Signed)
Immediate Anesthesia Transfer of Care Note  Patient: Sue [name]  Procedure(s) Performed: ESOPHAGOGASTRODUODENOSCOPY (EGD) (N/A ) ENTEROSCOPY (N/A ) HOT HEMOSTASIS (ARGON PLASMA COAGULATION/BICAP) (N/A )  Patient Location: PACU  Anesthesia Type:MAC  Level of Consciousness: drowsy, patient cooperative and responds to stimulation  Airway & Oxygen Therapy: Patient Spontanous Breathing and Patient connected to face mask oxygen  Post-op Assessment: Report given to RN and Post -op Vital signs reviewed and stable  Post vital signs: Reviewed and stable  Last Vitals:  Vitals Value Taken Time  BP 126/61 11/27/19 1420  Temp    Pulse 100 11/27/19 1422  Resp 27 11/27/19 1422  SpO2 100 % 11/27/19 1422  Vitals shown include unvalidated device data.  Last Pain:  Vitals:   11/27/19 1421  TempSrc:   PainSc: 0-No pain      Patients Stated Pain Goal: 1 (18/86/77 3736)  Complications: No apparent anesthesia complications

## 2019-11-28 ENCOUNTER — Encounter (HOSPITAL_COMMUNITY)
Admission: EM | Disposition: A | Discharge status: Home Health | Payer: Self-pay | Source: Home / Self Care | Attending: Internal Medicine

## 2019-11-28 ENCOUNTER — Encounter: Payer: Self-pay | Admitting: *Deleted

## 2019-11-28 HISTORY — PX: GIVENS CAPSULE STUDY: SHX5432

## 2019-11-28 LAB — TYPE AND SCREEN
ABO/RH(D): O POS
Antibody Screen: NEGATIVE
Unit division: 0
Unit division: 0
Unit division: 0
Unit division: 0
Unit division: 0
Unit division: 0

## 2019-11-28 LAB — CBC
HCT: 25.6 % — ABNORMAL LOW (ref 36.0–46.0)
Hemoglobin: 8.7 g/dL — ABNORMAL LOW (ref 12.0–15.0)
MCH: 31 pg (ref 26.0–34.0)
MCHC: 34 g/dL (ref 30.0–36.0)
MCV: 91.1 fL (ref 80.0–100.0)
Platelets: 104 10*3/uL — ABNORMAL LOW (ref 150–400)
RBC: 2.81 MIL/uL — ABNORMAL LOW (ref 3.87–5.11)
RDW: 14.4 % (ref 11.5–15.5)
WBC: 12.9 10*3/uL — ABNORMAL HIGH (ref 4.0–10.5)
nRBC: 0.5 % — ABNORMAL HIGH (ref 0.0–0.2)

## 2019-11-28 LAB — BPAM RBC
Blood Product Expiration Date: 202104142359
Blood Product Expiration Date: 202104152359
Blood Product Expiration Date: 202104152359
Blood Product Expiration Date: 202104152359
Blood Product Expiration Date: 202104152359
Blood Product Expiration Date: 202104152359
ISSUE DATE / TIME: 202103151356
ISSUE DATE / TIME: 202103152007
ISSUE DATE / TIME: 202103162230
ISSUE DATE / TIME: 202103170232
ISSUE DATE / TIME: 202103171843
ISSUE DATE / TIME: 202103172222
Unit Type and Rh: 5100
Unit Type and Rh: 5100
Unit Type and Rh: 5100
Unit Type and Rh: 5100
Unit Type and Rh: 5100
Unit Type and Rh: 5100

## 2019-11-28 LAB — MAGNESIUM: Magnesium: 1.7 mg/dL (ref 1.7–2.4)

## 2019-11-28 LAB — HEMOGLOBIN AND HEMATOCRIT, BLOOD
HCT: 24.6 % — ABNORMAL LOW (ref 36.0–46.0)
HCT: 27 % — ABNORMAL LOW (ref 36.0–46.0)
Hemoglobin: 8.1 g/dL — ABNORMAL LOW (ref 12.0–15.0)
Hemoglobin: 9.1 g/dL — ABNORMAL LOW (ref 12.0–15.0)

## 2019-11-28 LAB — BASIC METABOLIC PANEL
Anion gap: 4 — ABNORMAL LOW (ref 5–15)
BUN: 17 mg/dL (ref 8–23)
CO2: 25 mmol/L (ref 22–32)
Calcium: 7.6 mg/dL — ABNORMAL LOW (ref 8.9–10.3)
Chloride: 113 mmol/L — ABNORMAL HIGH (ref 98–111)
Creatinine, Ser: 1.27 mg/dL — ABNORMAL HIGH (ref 0.44–1.00)
GFR calc Af Amer: 51 mL/min — ABNORMAL LOW (ref >60)
GFR calc non Af Amer: 44 mL/min — ABNORMAL LOW (ref >60)
Glucose, Bld: 100 mg/dL — ABNORMAL HIGH (ref 70–99)
Potassium: 4 mmol/L (ref 3.5–5.1)
Sodium: 142 mmol/L (ref 135–145)

## 2019-11-28 LAB — GLUCOSE, CAPILLARY
Glucose-Capillary: 67 mg/dL — ABNORMAL LOW (ref 70–99)
Glucose-Capillary: 90 mg/dL (ref 70–99)
Glucose-Capillary: 79 mg/dL (ref 70–99)
Glucose-Capillary: 82 mg/dL (ref 70–99)

## 2019-11-28 SURGERY — IMAGING PROCEDURE, GI TRACT, INTRALUMINAL, VIA CAPSULE
Anesthesia: LOCAL

## 2019-11-28 MED ORDER — SODIUM CHLORIDE 0.9 % IV SOLN: INTRAVENOUS | Status: DC

## 2019-11-28 MED ORDER — PEG 3350-KCL-NA BICARB-NACL 420 G PO SOLR
4000.0000 mL | Freq: Once | ORAL | Status: AC
  Administered 2019-11-28: 4000 mL via ORAL
  Filled 2019-11-28: qty 4000

## 2019-11-28 MED ORDER — DEXTROSE 50 % IV SOLN
INTRAVENOUS | Status: AC
  Administered 2019-11-28: 08:00:00 25 mL
  Filled 2019-11-28: qty 50

## 2019-11-28 MED ORDER — SODIUM CHLORIDE 0.9 % IV BOLUS
500.0000 mL | Freq: Once | INTRAVENOUS | Status: AC
  Administered 2019-11-28: 500 mL via INTRAVENOUS

## 2019-11-28 MED ORDER — CHLORHEXIDINE GLUCONATE CLOTH 2 % EX PADS
6.0000 | MEDICATED_PAD | Freq: Every day | CUTANEOUS | Status: DC
  Administered 2019-11-28 – 2019-12-01 (×3): 6 via TOPICAL

## 2019-11-28 SURGICAL SUPPLY — 1 items: TOWEL COTTON PACK 4EA (MISCELLANEOUS) ×4 IMPLANT

## 2019-11-28 NOTE — H&P (View-Only) (Signed)
[facility] Progress Note  Sue [name] 69 y.o. May 09, 1951   Subjective: Bloody stools overnight. Denies abdominal pain, N/V. Transferred to ICU step down yesterday with ongoing bleeding. Hypotensive this morning.  Objective: Vital signs: Vitals:   11/28/19 0817 11/28/19 0820  BP: (!) 68/41 (!) 82/33  Pulse: 87 89  Resp: 18 (!) 22  Temp:    SpO2: 100% 98%  T 98.1  Physical Exam: Gen: lethargic, obese, no acute distress  HEENT: anicteric sclera CV: RRR Chest: CTA B Abd: soft, nontender, nondistended, +BS Ext: no edema  Lab Results: Recent Labs    11/27/19 0842 11/28/19 0438  NA 140 142  K 4.8 4.0  CL 112* 113*  CO2 24 25  GLUCOSE 171* 100*  BUN 17 17  CREATININE 1.36* 1.27*  CALCIUM 7.2* 7.6*  MG 1.7 1.7   Recent Labs    11/27/19 1030  AST 14*  ALT 10  ALKPHOS 69  BILITOT 0.5  PROT 4.0*  ALBUMIN 1.8*   Recent Labs    11/27/19 0842 11/27/19 0842 11/27/19 1711 11/28/19 0438  WBC 11.4*  --   --  12.9*  HGB 8.0*   < > 6.7* 8.7*  HCT 23.5*   < > 20.3* 25.6*  MCV 89.0  --   --  91.1  PLT 118*  --   --  104*   < > = values in this interval not displayed.      Assessment/Plan: GI bleeding of unclear etiology. Repeat push enteroscopy negative for active bleeding. 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 Main concern would be proximal colonic source previously hemoclipped and repeat colonoscopy planned for tomorrow if she can drink her prep. Only drank half of the prep yesterday. Encouraged to drink NuLytely today that has been ordered for this afternoon. Repeat capsule endoscopy placed this morning to look for any distal small bowel sources of her obscure GI bleeding after consultation with [facility] during inquiry for possible transfer for deep small bowel enteroscopy. If her repeat capsule and colonoscopy fail to identify a source, then will need to contact [facility] again for possible transfer as an inpt to their service. D/W Dr.  Lonny Prude.   [name] 11/28/2019, 9:39 AM  Questions please call [phone] ID: Sue [name], female   DOB: 1951/02/05, 69 y.o.   MRN: [id number]

## 2019-11-28 NOTE — Anesthesia Postprocedure Evaluation (Signed)
Anesthesia Post Note  Patient: Sue [name]  Procedure(s) Performed: ESOPHAGOGASTRODUODENOSCOPY (EGD) (N/A ) ENTEROSCOPY (N/A ) HOT HEMOSTASIS (ARGON PLASMA COAGULATION/BICAP) (N/A )     Patient location during evaluation: PACU Anesthesia Type: MAC Level of consciousness: awake and alert Pain management: pain level controlled Vital Signs Assessment: post-procedure vital signs reviewed and stable Respiratory status: spontaneous breathing Cardiovascular status: stable Anesthetic complications: no    Last Vitals:  Vitals:   11/28/19 1724 11/28/19 2003  BP: (!) 106/56   Pulse: 86   Resp: 16   Temp:  36.8 C  SpO2: 100%     Last Pain:  Vitals:   11/28/19 2003  TempSrc: Oral  PainSc:                  Nolon Nations

## 2019-11-28 NOTE — Progress Notes (Signed)
Patients blood sugar was 67, 29mls of D50 given due to pt being NPO for capsule study. Recheck of blood sugar was 90. Will continue to monitor.

## 2019-11-28 NOTE — Progress Notes (Signed)
Patient ingested capsule at 0829.Patient and bedside RN educated on NPO status and diet advancement.

## 2019-11-28 NOTE — Anesthesia Preprocedure Evaluation (Addendum)
Anesthesia Evaluation  Patient identified by MRN, date of birth, ID band Patient awake    Reviewed: Allergy & Precautions, NPO status , Patient's Chart, lab work & pertinent test results  History of Anesthesia Complications Negative for: history of anesthetic complications  Airway Mallampati: III  TM Distance: >3 FB     Dental no notable dental hx. (+) Dental Advisory Given   Pulmonary COPD,  COPD inhaler, former smoker,    Pulmonary exam normal        Cardiovascular hypertension, Normal cardiovascular exam     Neuro/Psych PSYCHIATRIC DISORDERS Anxiety Depression TIA   GI/Hepatic Neg liver ROS, GERD  Medicated,  Endo/Other  diabetesHypothyroidism Morbid obesity  Renal/GU Renal InsufficiencyRenal disease  negative genitourinary   Musculoskeletal negative musculoskeletal ROS (+)   Abdominal   Peds negative pediatric ROS (+)  Hematology  (+) anemia ,   Anesthesia Other Findings   Reproductive/Obstetrics negative OB ROS                           Anesthesia Physical Anesthesia Plan  ASA: III  Anesthesia Plan: MAC   Post-op Pain Management:    Induction:   PONV Risk Score and Plan: Ondansetron and Propofol infusion  Airway Management Planned: Natural Airway  Additional Equipment:   Intra-op Plan:   Post-operative Plan:   Informed Consent: I have reviewed the patients History and Physical, chart, labs and discussed the procedure including the risks, benefits and alternatives for the proposed anesthesia with the patient or authorized representative who has indicated his/her understanding and acceptance.     Dental advisory given  Plan Discussed with: Anesthesiologist  Anesthesia Plan Comments:        Anesthesia Quick Evaluation

## 2019-11-28 NOTE — Progress Notes (Signed)
[facility] Progress Note  Sue [name] 69 y.o. Jun 12, 1951   Subjective: Bloody stools overnight. Denies abdominal pain, N/V. Transferred to ICU step down yesterday with ongoing bleeding. Hypotensive this morning.  Objective: Vital signs: Vitals:   11/28/19 0817 11/28/19 0820  BP: (!) 68/41 (!) 82/33  Pulse: 87 89  Resp: 18 (!) 22  Temp:    SpO2: 100% 98%  T 98.1  Physical Exam: Gen: lethargic, obese, no acute distress  HEENT: anicteric sclera CV: RRR Chest: CTA B Abd: soft, nontender, nondistended, +BS Ext: no edema  Lab Results: Recent Labs    11/27/19 0842 11/28/19 0438  NA 140 142  K 4.8 4.0  CL 112* 113*  CO2 24 25  GLUCOSE 171* 100*  BUN 17 17  CREATININE 1.36* 1.27*  CALCIUM 7.2* 7.6*  MG 1.7 1.7   Recent Labs    11/27/19 1030  AST 14*  ALT 10  ALKPHOS 69  BILITOT 0.5  PROT 4.0*  ALBUMIN 1.8*   Recent Labs    11/27/19 0842 11/27/19 0842 11/27/19 1711 11/28/19 0438  WBC 11.4*  --   --  12.9*  HGB 8.0*   < > 6.7* 8.7*  HCT 23.5*   < > 20.3* 25.6*  MCV 89.0  --   --  91.1  PLT 118*  --   --  104*   < > = values in this interval not displayed.      Assessment/Plan: GI bleeding of unclear etiology. Repeat push enteroscopy negative for active bleeding. 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 Main concern would be proximal colonic source previously hemoclipped and repeat colonoscopy planned for tomorrow if she can drink her prep. Only drank half of the prep yesterday. Encouraged to drink NuLytely today that has been ordered for this afternoon. Repeat capsule endoscopy placed this morning to look for any distal small bowel sources of her obscure GI bleeding after consultation with [facility] during inquiry for possible transfer for deep small bowel enteroscopy. If her repeat capsule and colonoscopy fail to identify a source, then will need to contact [facility] again for possible transfer as an inpt to their service. D/W Dr.  Lonny Prude.   Sue [name] 11/28/2019, 9:39 AM  Questions please call [phone] ID: Sue [name], female   DOB: [birth year], 69 y.o.   MRN: [id number]

## 2019-11-28 NOTE — Progress Notes (Addendum)
PROGRESS NOTE    Sue [name]  HGD:[id number] DOB: [birth year] DOA: 11/13/2019 PCP: Ann Held, DO   Brief Narrative: Sue [name] is a 69 y.o. female with a prolonged hospitalization secondary to continued GI bleeding of unknown source. Patient has a history of DM2, HTN, COPD on 3 L nasal cannula, morbid obesity. Patient presented secondary to bright red blood per rectum and severe anemia. She has had multiple endoscopy procedures to attempt to localize the source of bleeding without success to date. She continues to require blood transfusions regularly for recurrent/ongoing GI bleeding.   Assessment & Plan:   Principal Problem:   Lower GI bleed Active Problems:   HTN (hypertension)   Chronic respiratory failure (HCC)   Morbid obesity due to excess calories (HCC)   Diabetes mellitus type II, uncontrolled (HCC)   CKD (chronic kidney disease) stage 3, GFR 30-59 ml/min   Acute blood loss anemia   GI bleed Unknown source. [facility] on board. Patient has had multiple endoscopies including colonoscopy, push enteroscopy, capsule endoscopy without revelation of source. She has also had a bleeding scan which was unrevealing for source. To date, she has received 17 units of PRBC to date. Hemoglobin 8.7 this morning s/p 2 units overnight -GI recommendations: Capsule endoscopy today. Colonoscopy tomorrow. -Serial CBC/H&H -Transfuse for hemoglobin <7  Acute blood loss anemia Secondary to above.  Chest pain Atypical. EKG obtained overnight sinus rhythm with some mild lateral lead t-wave flattening which appears unchanged from earlier this month. Chest discomfort resolved with adjustment of nasal canula (previous device was not delivering oxygen correctly). Chest x-ray obtained and is unremarkable for acute process. Hepatic function panel was negative.  Chronic respiratory failure with hypoxia Patient is on 3L via nasal canula.  Hypotension Secondary to acute blood loss -500 mL  bolus and start IV fluids  CKD stage IIIb Baseline creatinine of 1.5 per chart review.  Essential hypertension -Continue metoprolol XL 50 mg daily  Diabetes mellitus, type 2 -Continue Lantus and SSI  Chronic diastolic heart failure Euvolemic. Chest x-ray clear today.  Depression Anxiety Continue Buspar, Seroquel  Hypothyroidism -Continue Synthroid  Morbid obesity Body mass index is 47.83 kg/m.   DVT prophylaxis: SCDs Code Status:   Code Status: Full Code Family Communication: None at bedside Disposition Plan: Discharge pending continued management of active GI bleeding   Consultants:   [facility]  Procedures:   COLONOSCOPY (3/9) Impression:               - Old blood in the entire examined colon.                           - Friability with spontaneous minimal bleeding in                            what I believe was the cecum/appendiceal orifice                            region. Clips were placed. Tattooed.                           - No specimens collected.                           - It is unclear whether the  observed oozing                            accounts for the patient's continuing blood loss,                            and/or whether it is a localized problem or                            indicative of a more generalized process.  Recommendation:           - To visualize the small bowel, perform video                            capsule endoscopy today.   COLONOSCOPY (3/12) Impression:               - Preparation of the colon was fair.                           - No active bleeding seen.                           - A single erosion with adherent blood was seen in                            the proximal ascending colon. Clip was placed.                           - No specimens collected.  Recommendation:           - Repeat colonoscopy tomorrow for retreatment.   COLONOSCOPY (3/13) Impression:               - Preparation of the colon  was fair.                           - Blood in the entire examined colon.                           - The examined portion of the ileum was normal.                           - The distal rectum and anal verge are normal on                            retroflexion view.                           - No specimens collected.  Recommendation:           - Clear liquid diet.                           - NM GI bleeding scan for obscure overt GI bleeding.   PUSH ENTEROSCOPY (3/14) Findings: Normal esophagus, normal stomach, normal duodenal bulb and duodenum. No evidence of active or recent bleeding unlike seen on GI bleeding scan. 2  small AVMs noted in proximal jejunum, treated with APC. A small bowel PillCam was deployed in the duodenal bulb.   Recommendations: N.p.o. for now. Okay to start clear liquid diet 2 hours later. Await small bowel PillCam results. Monitor H&H and transfuse as needed.   PUSH ENTEROSCOPY (3/17) Impression:               - Normal esophagus.                           - Z-line, 42 cm from the incisors.                           - Acute gastritis.                           - Two non-bleeding angiodysplastic lesions in the                            duodenum. Treated with argon plasma coagulation                            (APC).                           - A single non-bleeding angiodysplastic lesion in                            the duodenum. Treated with argon plasma coagulation                            (APC).                           - A single non-bleeding angiodysplastic lesion in                            the jejunum. Treated with argon plasma coagulation                            (APC).                           - No specimens collected. Recommendation:           - Capsule endoscopy tomorrow and repeat colonoscopy                            on 11/29/19. If no source found then may need a deep                            small bowel enteroscopy at a  tertiary care center.  Antimicrobials:  None    Subjective: No concerns today. Reports bloody stool with prep overnight.  Objective: Vitals:   11/28/19 0817 11/28/19 0820 11/28/19 1059 11/28/19 1100  BP: (!) 68/41 (!) 82/33 (!) 118/58 (!) 108/51  Pulse: 87 89 89 90  Resp: 18 (!) 22  17  Temp:      TempSrc:  SpO2: 100% 98%  100%  Weight:      Height:        Intake/Output Summary (Last 24 hours) at 11/28/2019 1338 Last data filed at 11/28/2019 1220 Gross per 24 hour  Intake 2588.04 ml  Output 200 ml  Net 2388.04 ml   Filed Weights   11/13/19 0330 11/24/19 1036  Weight: 122.5 kg 122.5 kg    Examination:  General exam: Appears calm and comfortable Respiratory system: Clear to auscultation; difficult secondary to body habitus. Respiratory effort normal. Cardiovascular system: S1 & S2 heard, RRR. No murmurs, rubs, gallops or clicks. Gastrointestinal system: Abdomen is nondistended, soft and nontender. No organomegaly or masses felt. Normal bowel sounds heard. Central nervous system: Alert and oriented. No focal neurological deficits. Extremities: No edema. No calf tenderness Skin: No cyanosis. No rashes Psychiatry: Judgement and insight appear normal. Mood & affect appropriate.      Data Reviewed: I have personally reviewed following labs and imaging studies  CBC: Recent Labs  Lab 11/21/19 1923 11/22/19 0437 11/25/19 0444 11/26/19 0106 11/26/19 0600 11/26/19 0600 11/26/19 2029 11/27/19 0842 11/27/19 1711 11/28/19 0438 11/28/19 1312  WBC 9.6   < > 8.6  --  8.0  --  8.7 11.4*  --  12.9*  --   NEUTROABS 5.5  --   --   --   --   --   --   --   --   --   --   HGB 8.7*   < > 6.7*   < > 7.7*   < > 6.1* 8.0* 6.7* 8.7* 8.1*  HCT 28.6*   < > 21.8*   < > 24.5*   < > 19.2* 23.5* 20.3* 25.6* 24.6*  MCV 95.0   < > 92.8  --  92.8  --  92.3 89.0  --  91.1  --   PLT 187   < > 152  --  142*  --  143* 118*  --  104*  --    < > = values in this interval not displayed.    Basic Metabolic Panel: Recent Labs  Lab 11/23/19 0558 11/24/19 0538 11/26/19 0600 11/27/19 0842 11/28/19 0438  NA 140 143 142 140 142  K 4.1 4.0 4.2 4.8 4.0  CL 111 112* 112* 112* 113*  CO2 24 25 24 24 25   GLUCOSE 98 83 100* 171* 100*  BUN 12 10 10 17 17   CREATININE 1.11* 1.11* 1.17* 1.36* 1.27*  CALCIUM 7.8* 7.6* 7.6* 7.2* 7.6*  MG  --   --   --  1.7 1.7   GFR: Estimated Creatinine Clearance: 54.6 mL/min (A) (by C-G formula based on SCr of 1.27 mg/dL (H)). Liver Function Tests: Recent Labs  Lab 11/27/19 1030  AST 14*  ALT 10  ALKPHOS 69  BILITOT 0.5  PROT 4.0*  ALBUMIN 1.8*   No results for input(s): LIPASE, AMYLASE in the last 168 hours. No results for input(s): AMMONIA in the last 168 hours. Coagulation Profile: Recent Labs  Lab 11/23/19 2225  INR 1.2   Cardiac Enzymes: No results for input(s): CKTOTAL, CKMB, CKMBINDEX, TROPONINI in the last 168 hours. BNP (last 3 results) No results for input(s): PROBNP in the last 8760 hours. HbA1C: No results for input(s): HGBA1C in the last 72 hours. CBG: Recent Labs  Lab 11/27/19 1600 11/27/19 2116 11/28/19 0719 11/28/19 0846 11/28/19 1153  GLUCAP 132* 119* 67* 90 79   Lipid Profile: No results for input(s): CHOL, HDL, LDLCALC, TRIG,  CHOLHDL, LDLDIRECT in the last 72 hours. Thyroid Function Tests: No results for input(s): TSH, T4TOTAL, FREET4, T3FREE, THYROIDAB in the last 72 hours. Anemia Panel: No results for input(s): VITAMINB12, FOLATE, FERRITIN, TIBC, IRON, RETICCTPCT in the last 72 hours. Sepsis Labs: No results for input(s): PROCALCITON, LATICACIDVEN in the last 168 hours.  No results found for this or any previous visit (from the past 240 hour(s)).       Radiology Studies: NM Bowel Img Meckels  Result Date: 11/26/2019 CLINICAL DATA:  Gastrointestinal bleeding. Concern for Meckel's diverticulum. EXAM: NUCLEAR MEDICINE MECKELS SCAN TECHNIQUE: Sequential abdominal images were obtained following  intravenous injection of radiopharmaceutical. RADIOPHARMACEUTICALS:  17.7 millicuries technetium pertechnetate COMPARISON:  Tagged red blood cell bleeding scan 11/23/2019 FINDINGS: No accumulation of radiotracer in the abdomen or pelvis to localize ectopic gastric mucosa. Physiologic activity localizing to the stomach as expected. Physiologic activity noted in the GU tract. IMPRESSION: No evidence of ectopic gastric mucosa to localize Meckel's diverticulum. Electronically Signed   By: Suzy Bouchard M.D.   On: 11/26/2019 16:07   DG CHEST PORT 1 VIEW  Result Date: 11/27/2019 CLINICAL DATA:  Dyspnea, chest pain. EXAM: PORTABLE CHEST 1 VIEW COMPARISON:  November 13, 2019. FINDINGS: Stable cardiomediastinal silhouette. No pneumothorax or pleural effusion is noted. Both lungs are clear. The visualized skeletal structures are unremarkable. IMPRESSION: No active disease. Electronically Signed   By: Marijo Conception M.D.   On: 11/27/2019 10:30        Scheduled Meds: . sodium chloride   Intravenous Once  . sodium chloride   Intravenous Once  . sodium chloride   Intravenous Once  . busPIRone  15 mg Oral Daily  . Chlorhexidine Gluconate Cloth  6 each Topical Daily  . feeding supplement  1 Container Oral BID BM  . feeding supplement (PRO-STAT SUGAR FREE 64)  30 mL Oral BID  . furosemide  20 mg Intravenous Once  . insulin aspart  0-15 Units Subcutaneous TID WC  . insulin aspart  0-5 Units Subcutaneous QHS  . insulin glargine  20 Units Subcutaneous Daily  . levothyroxine  50 mcg Oral Daily  . mouth rinse  15 mL Mouth Rinse BID  . metoprolol succinate  50 mg Oral Daily  . multivitamin with minerals  1 tablet Oral Daily  . polyethylene glycol-electrolytes  4,000 mL Oral Once  . QUEtiapine  100 mg Oral QHS  . zolpidem  5 mg Oral QHS   Continuous Infusions: . sodium chloride Stopped (11/28/19 1221)  . sodium chloride 75 mL/hr at 11/28/19 1221     LOS: 15 days     Cordelia Poche, MD [facility] 11/28/2019, 1:38 PM  If 7PM-7AM, please contact night-coverage [email or web address]

## 2019-11-29 ENCOUNTER — Inpatient Hospital Stay (HOSPITAL_COMMUNITY): Payer: Medicare HMO | Admitting: Anesthesiology

## 2019-11-29 ENCOUNTER — Encounter (HOSPITAL_COMMUNITY)
Admission: EM | Disposition: A | Discharge status: Home Health | Payer: Self-pay | Source: Home / Self Care | Attending: Internal Medicine

## 2019-11-29 ENCOUNTER — Encounter: Payer: Self-pay | Admitting: *Deleted

## 2019-11-29 HISTORY — PX: COLONOSCOPY WITH PROPOFOL: SHX5780

## 2019-11-29 LAB — GLUCOSE, CAPILLARY
Glucose-Capillary: 82 mg/dL (ref 70–99)
Glucose-Capillary: 121 mg/dL — ABNORMAL HIGH (ref 70–99)
Glucose-Capillary: 104 mg/dL — ABNORMAL HIGH (ref 70–99)

## 2019-11-29 LAB — BASIC METABOLIC PANEL
Anion gap: 2 — ABNORMAL LOW (ref 5–15)
BUN: 15 mg/dL (ref 8–23)
CO2: 27 mmol/L (ref 22–32)
Calcium: 7.6 mg/dL — ABNORMAL LOW (ref 8.9–10.3)
Chloride: 115 mmol/L — ABNORMAL HIGH (ref 98–111)
Creatinine, Ser: 1.28 mg/dL — ABNORMAL HIGH (ref 0.44–1.00)
GFR calc Af Amer: 50 mL/min — ABNORMAL LOW (ref >60)
GFR calc non Af Amer: 43 mL/min — ABNORMAL LOW (ref >60)
Glucose, Bld: 92 mg/dL (ref 70–99)
Potassium: 4.5 mmol/L (ref 3.5–5.1)
Sodium: 144 mmol/L (ref 135–145)

## 2019-11-29 LAB — CBC
HCT: 25.6 % — ABNORMAL LOW (ref 36.0–46.0)
Hemoglobin: 8.3 g/dL — ABNORMAL LOW (ref 12.0–15.0)
MCH: 30.4 pg (ref 26.0–34.0)
MCHC: 32.4 g/dL (ref 30.0–36.0)
MCV: 93.8 fL (ref 80.0–100.0)
Platelets: 109 10*3/uL — ABNORMAL LOW (ref 150–400)
RBC: 2.73 MIL/uL — ABNORMAL LOW (ref 3.87–5.11)
RDW: 15.2 % (ref 11.5–15.5)
WBC: 10 10*3/uL (ref 4.0–10.5)
nRBC: 0.4 % — ABNORMAL HIGH (ref 0.0–0.2)

## 2019-11-29 LAB — HEMOGLOBIN AND HEMATOCRIT, BLOOD
HCT: 26.2 % — ABNORMAL LOW (ref 36.0–46.0)
HCT: 27.8 % — ABNORMAL LOW (ref 36.0–46.0)
Hemoglobin: 8.3 g/dL — ABNORMAL LOW (ref 12.0–15.0)
Hemoglobin: 8.7 g/dL — ABNORMAL LOW (ref 12.0–15.0)

## 2019-11-29 LAB — MAGNESIUM: Magnesium: 1.9 mg/dL (ref 1.7–2.4)

## 2019-11-29 SURGERY — COLONOSCOPY WITH PROPOFOL
Anesthesia: Monitor Anesthesia Care

## 2019-11-29 MED ORDER — PHENYLEPHRINE 40 MCG/ML (10ML) SYRINGE FOR IV PUSH (FOR BLOOD PRESSURE SUPPORT)
PREFILLED_SYRINGE | INTRAVENOUS | Status: DC | PRN
  Administered 2019-11-29 (×2): 80 ug via INTRAVENOUS

## 2019-11-29 MED ORDER — PHENYLEPHRINE HCL-NACL 20-0.9 MG/250ML-% IV SOLN
INTRAVENOUS | Status: DC | PRN
  Administered 2019-11-29: 50 ug/min via INTRAVENOUS

## 2019-11-29 MED ORDER — ONDANSETRON HCL 4 MG/2ML IJ SOLN
INTRAMUSCULAR | Status: DC | PRN
  Administered 2019-11-29: 4 mg via INTRAVENOUS

## 2019-11-29 MED ORDER — LACTATED RINGERS IV SOLN
INTRAVENOUS | Status: DC
  Administered 2019-11-29: 1000 mL via INTRAVENOUS

## 2019-11-29 MED ORDER — PROPOFOL 500 MG/50ML IV EMUL
INTRAVENOUS | Status: AC
  Filled 2019-11-29: qty 50

## 2019-11-29 MED ORDER — PROPOFOL 500 MG/50ML IV EMUL
INTRAVENOUS | Status: DC | PRN
  Administered 2019-11-29: 90 ug/kg/min via INTRAVENOUS

## 2019-11-29 SURGICAL SUPPLY — 22 items

## 2019-11-29 NOTE — Anesthesia Postprocedure Evaluation (Signed)
Anesthesia Post Note  Patient: Sue [name]  Procedure(s) Performed: COLONOSCOPY WITH PROPOFOL (N/A )     Patient location during evaluation: Endoscopy Anesthesia Type: MAC Level of consciousness: awake and alert Pain management: pain level controlled Vital Signs Assessment: post-procedure vital signs reviewed and stable Respiratory status: spontaneous breathing and respiratory function stable Cardiovascular status: stable Postop Assessment: no apparent nausea or vomiting Anesthetic complications: no    Last Vitals:  Vitals:   11/29/19 0925 11/29/19 0930  BP:  133/64  Pulse: 95 94  Resp: (!) 21 (!) 24  Temp:    SpO2: 100% 100%    Last Pain:  Vitals:   11/29/19 0930  TempSrc:   PainSc: 0-No pain                 [name] DANIEL

## 2019-11-29 NOTE — Op Note (Signed)
[facility] Patient Name: Sue [name] Procedure Date: 11/29/2019 MRN: [id number] Attending MD: [name] , MD Date of Birth: Aug 10, 1951 CSN: [id number] Age: 69 Admit Type: Outpatient Procedure:                Colonoscopy Indications:               Providers:                [name], MD, Cleda Daub, RN, Janeece Agee, Technician Referring MD:             hospital team Medicines:                 Complications:            No immediate complications. Estimated Blood Loss:     Estimated blood loss: none. Procedure:                After obtaining informed consent, the colonoscope                            was passed under direct vision. Throughout the                            procedure, the patient's blood pressure, pulse, and                            oxygen saturations were monitored continuously. The                            [id number]-[id number] ([id number]) Olympus pediatric colonscope                            was introduced through the anus and advanced to the                            the cecum, identified by appendiceal orifice and                            ileocecal valve. Scope In: 8:41:50 AM Scope Out: 9:01:27 AM Scope Withdrawal Time: 0 hours 16 minutes 38 seconds  Total Procedure Duration: 0 hours 19 minutes 37 seconds  Findings:      The perianal and digital rectal examinations were normal.      A few small and large-mouthed diverticula were found in the ascending       colon.      A tattoo was seen in the ascending colon. The tattoo site appeared       normal.      Internal hemorrhoids were found during retroflexion. The hemorrhoids       were medium-sized and Grade I (internal hemorrhoids that do not       prolapse).      The terminal ileum appeared normal (advanced 20 cm into the terminal       ileum and no bleeding or AVMs seen).      There is no endoscopic evidence of bleeding in the entire colon.  Pillcam seen in cecum upon intubation of the cecum and on pull back of       the colonoscope it was noted in the rectum. Two previously hemoclipped       sites without any active bleeding. Red spot that occurred during       suctioning of the cecum from scope trauma. Impression:               - Diverticulosis in the ascending colon.                           - A tattoo was seen in the ascending colon. The                            tattoo site appeared normal.                           - Internal hemorrhoids.                           - The examined portion of the ileum was normal.                           - No specimens collected. Moderate Sedation:      N/A - MAC procedure Recommendation:           - Patient has a contact number available for                            emergencies. The signs and symptoms of potential                            delayed complications were discussed with the                            patient. Return to normal activities tomorrow.                            Written discharge instructions were provided to the                            patient.                           - Repeat colonoscopy in 5 years for screening                            purposes. Procedure Code(s):        --- Professional ---                           [phone], Colonoscopy, flexible; diagnostic, including                            collection of specimen(s) by brushing or washing,  when performed (separate procedure) Diagnosis Code(s):        --- Professional ---                           K64.0, First degree hemorrhoids                           K57.30, Diverticulosis of large intestine without                            perforation or abscess without bleeding CPT copyright 2019 [employer]. All rights reserved. The codes documented in this report are preliminary and upon coder review may  be revised to meet current compliance  requirements. [name], MD 11/29/2019 9:19:19 AM This report has been signed electronically. Number of Addenda: 0

## 2019-11-29 NOTE — Transfer of Care (Signed)
Immediate Anesthesia Transfer of Care Note  Patient: Sue [name]  Procedure(s) Performed: COLONOSCOPY WITH PROPOFOL (N/A )  Patient Location: PACU and Endoscopy Unit  Anesthesia Type:MAC  Level of Consciousness: awake, alert , oriented and patient cooperative  Airway & Oxygen Therapy: Patient Spontanous Breathing and Patient connected to face mask oxygen  Post-op Assessment: Report given to RN, Post -op Vital signs reviewed and stable and Patient moving all extremities  Post vital signs: Reviewed and stable  Last Vitals:  Vitals Value Taken Time  BP    Temp    Pulse    Resp    SpO2      Last Pain:  Vitals:   11/29/19 0747  TempSrc: Oral  PainSc: 0-No pain      Patients Stated Pain Goal: 1 (07/86/75 [id number])  Complications: No apparent anesthesia complications

## 2019-11-29 NOTE — Interval H&P Note (Signed)
History and Physical Interval Note:  11/29/2019 8:20 AM  Sue [name]  has presented today for surgery, with the diagnosis of GIB.  The various methods of treatment have been discussed with the patient and family. After consideration of risks, benefits and other options for treatment, the patient has consented to  Procedure(s): COLONOSCOPY WITH PROPOFOL (N/A) as a surgical intervention.  The patient's history has been reviewed, patient examined, no change in status, stable for surgery.  I have reviewed the patient's chart and labs.  Questions were answered to the patient's satisfaction.     [name]

## 2019-11-29 NOTE — Progress Notes (Signed)
PROGRESS NOTE    [name]  IRW:[id number] DOB: 09/11/1951 DOA: 11/13/2019 PCP: Ann Held, DO   Brief Narrative: Sue [name] is a 69 y.o. female with a prolonged hospitalization secondary to continued GI bleeding of unknown source. Patient has a history of DM2, HTN, COPD on 3 L nasal cannula, morbid obesity. Patient presented secondary to bright red blood per rectum and severe anemia. She has had multiple endoscopy procedures to attempt to localize the source of bleeding without success to date. She continues to require blood transfusions regularly for recurrent/ongoing GI bleeding.   Assessment & Plan:   Principal Problem:   Lower GI bleed Active Problems:   HTN (hypertension)   Chronic respiratory failure (HCC)   Morbid obesity due to excess calories (HCC)   Diabetes mellitus type II, uncontrolled (HCC)   CKD (chronic kidney disease) stage 3, GFR 30-59 ml/min   Acute blood loss anemia   GI bleed Unknown source. [facility] on board. Patient has had multiple endoscopies including colonoscopy, push enteroscopy, capsule endoscopy without revelation of source. She has also had a bleeding scan which was unrevealing for source. To date, she has received 17 units of PRBC to date. Hemoglobin stable and GI bleeding has slowed. -GI recommendations: no bleeding source found -Serial CBC/H&H -Transfuse for hemoglobin <7  Acute blood loss anemia Secondary to above.  Chest pain Atypical. EKG obtained overnight sinus rhythm with some mild lateral lead t-wave flattening which appears unchanged from earlier this month. Chest discomfort resolved with adjustment of nasal canula (previous device was not delivering oxygen correctly). Chest x-ray obtained and is unremarkable for acute process. Hepatic function panel was negative.  Chronic respiratory failure with hypoxia Patient is on 3L via nasal canula.  Hypotension Secondary to acute blood loss. Improved with IV fluids.  CKD  stage IIIb Baseline creatinine of 1.5 per chart review.  Essential hypertension -Continue metoprolol XL 50 mg daily  Diabetes mellitus, type 2 -Continue Lantus and SSI  Chronic diastolic heart failure Euvolemic. Chest x-ray clear today.  Depression Anxiety Continue Buspar, Seroquel  Hypothyroidism -Continue Synthroid  Morbid obesity Body mass index is 47.84 kg/m.   DVT prophylaxis: SCDs Code Status:   Code Status: Full Code Family Communication: None at bedside Disposition Plan: Discharge pending continued management of active GI bleeding   Consultants:   [facility]  Procedures:   COLONOSCOPY (3/9) Impression:               - Old blood in the entire examined colon.                           - Friability with spontaneous minimal bleeding in                            what I believe was the cecum/appendiceal orifice                            region. Clips were placed. Tattooed.                           - No specimens collected.                           - It is unclear whether the observed oozing  accounts for the patient's continuing blood loss,                            and/or whether it is a localized problem or                            indicative of a more generalized process.  Recommendation:           - To visualize the small bowel, perform video                            capsule endoscopy today.   COLONOSCOPY (3/12) Impression:               - Preparation of the colon was fair.                           - No active bleeding seen.                           - A single erosion with adherent blood was seen in                            the proximal ascending colon. Clip was placed.                           - No specimens collected.  Recommendation:           - Repeat colonoscopy tomorrow for retreatment.   COLONOSCOPY (3/13) Impression:               - Preparation of the colon was fair.                            - Blood in the entire examined colon.                           - The examined portion of the ileum was normal.                           - The distal rectum and anal verge are normal on                            retroflexion view.                           - No specimens collected.  Recommendation:           - Clear liquid diet.                           - NM GI bleeding scan for obscure overt GI bleeding.   PUSH ENTEROSCOPY (3/14) Findings: Normal esophagus, normal stomach, normal duodenal bulb and duodenum. No evidence of active or recent bleeding unlike seen on GI bleeding scan. 2 small AVMs noted in proximal jejunum, treated with APC. A small bowel PillCam was deployed in the duodenal bulb.   Recommendations: N.p.o. for now. Okay to start clear  liquid diet 2 hours later. Await small bowel PillCam results. Monitor H&H and transfuse as needed.   PUSH ENTEROSCOPY (3/17) Impression:               - Normal esophagus.                           - Z-line, 42 cm from the incisors.                           - Acute gastritis.                           - Two non-bleeding angiodysplastic lesions in the                            duodenum. Treated with argon plasma coagulation                            (APC).                           - A single non-bleeding angiodysplastic lesion in                            the duodenum. Treated with argon plasma coagulation                            (APC).                           - A single non-bleeding angiodysplastic lesion in                            the jejunum. Treated with argon plasma coagulation                            (APC).                           - No specimens collected. Recommendation:           - Capsule endoscopy tomorrow and repeat colonoscopy                            on 11/29/19. If no source found then may need a deep                            small bowel enteroscopy at a tertiary care center.   CAPSULE  ENDOSCOPY   COLONOSCOPY (3/19) Impression:               - Diverticulosis in the ascending colon.                           - A tattoo was seen in the ascending colon. The                            tattoo  site appeared normal.                           - Internal hemorrhoids.                           - The examined portion of the ileum was normal.                           - No specimens collected.  Recommendation:           - Patient has a contact number available for                            emergencies. The signs and symptoms of potential                            delayed complications were discussed with the                            patient. Return to normal activities tomorrow.                            Written discharge instructions were provided to the                            patient.                           - Repeat colonoscopy in 5 years for screening                            purposes.  Antimicrobials:  None    Subjective: No issues. Had some mild bleeding but nothing much.  Objective: Vitals:   11/29/19 0920 11/29/19 0925 11/29/19 0930 11/29/19 1218  BP: (!) 118/45  133/64 104/70  Pulse: 96 95 94 92  Resp: (!) 22 (!) 21 (!) 24   Temp:      TempSrc:      SpO2: 100% 100% 100%   Weight:      Height:        Intake/Output Summary (Last 24 hours) at 11/29/2019 1343 Last data filed at 11/29/2019 0911 Gross per 24 hour  Intake 1187.68 ml  Output 750 ml  Net 437.68 ml   Filed Weights   11/13/19 0330 11/24/19 1036 11/29/19 0747  Weight: 122.5 kg 122.5 kg 122.5 kg    Examination:  General exam: Appears calm and comfortable Respiratory system: Clear to auscultation. Respiratory effort normal. Cardiovascular system: S1 & S2 heard, RRR. No murmurs, rubs, gallops or clicks. Gastrointestinal system: Abdomen is nondistended, soft and nontender. No organomegaly or masses felt. Normal bowel sounds heard. Central nervous system: Alert and oriented. No  focal neurological deficits. Extremities: No edema. No calf tenderness Skin: No cyanosis. No rashes Psychiatry: Judgement and insight appear normal. Mood & affect appropriate.      Data Reviewed: I have personally reviewed following labs and imaging studies  CBC: Recent Labs  Lab 11/26/19 0600 11/26/19 0600 11/26/19 2029 11/26/19 2029 11/27/19 0842 11/27/19 1711 11/28/19 0438 11/28/19 1312 11/28/19 2159 11/29/19 0159 11/29/19  1256  WBC 8.0  --  8.7  --  11.4*  --  12.9*  --   --  10.0  --   HGB 7.7*   < > 6.1*   < > 8.0*   < > 8.7* 8.1* 9.1* 8.3* 8.3*  HCT 24.5*   < > 19.2*   < > 23.5*   < > 25.6* 24.6* 27.0* 25.6* 26.2*  MCV 92.8  --  92.3  --  89.0  --  91.1  --   --  93.8  --   PLT 142*  --  143*  --  118*  --  104*  --   --  109*  --    < > = values in this interval not displayed.   Basic Metabolic Panel: Recent Labs  Lab 11/24/19 0538 11/26/19 0600 11/27/19 0842 11/28/19 0438 11/29/19 0159  NA 143 142 140 142 144  K 4.0 4.2 4.8 4.0 4.5  CL 112* 112* 112* 113* 115*  CO2 25 24 24 25 27   GLUCOSE 83 100* 171* 100* 92  BUN 10 10 17 17 15   CREATININE 1.11* 1.17* 1.36* 1.27* 1.28*  CALCIUM 7.6* 7.6* 7.2* 7.6* 7.6*  MG  --   --  1.7 1.7 1.9   GFR: Estimated Creatinine Clearance: 54.1 mL/min (A) (by C-G formula based on SCr of 1.28 mg/dL (H)). Liver Function Tests: Recent Labs  Lab 11/27/19 1030  AST 14*  ALT 10  ALKPHOS 69  BILITOT 0.5  PROT 4.0*  ALBUMIN 1.8*   No results for input(s): LIPASE, AMYLASE in the last 168 hours. No results for input(s): AMMONIA in the last 168 hours. Coagulation Profile: Recent Labs  Lab 11/23/19 2225  INR 1.2   Cardiac Enzymes: No results for input(s): CKTOTAL, CKMB, CKMBINDEX, TROPONINI in the last 168 hours. BNP (last 3 results) No results for input(s): PROBNP in the last 8760 hours. HbA1C: No results for input(s): HGBA1C in the last 72 hours. CBG: Recent Labs  Lab 11/28/19 0719 11/28/19 0846 11/28/19 1153  11/28/19 1654 11/28/19 2125  GLUCAP 67* 90 79 82 82   Lipid Profile: No results for input(s): CHOL, HDL, LDLCALC, TRIG, CHOLHDL, LDLDIRECT in the last 72 hours. Thyroid Function Tests: No results for input(s): TSH, T4TOTAL, FREET4, T3FREE, THYROIDAB in the last 72 hours. Anemia Panel: No results for input(s): VITAMINB12, FOLATE, FERRITIN, TIBC, IRON, RETICCTPCT in the last 72 hours. Sepsis Labs: No results for input(s): PROCALCITON, LATICACIDVEN in the last 168 hours.  No results found for this or any previous visit (from the past 240 hour(s)).       Radiology Studies: No results found.      Scheduled Meds: . sodium chloride   Intravenous Once  . sodium chloride   Intravenous Once  . sodium chloride   Intravenous Once  . busPIRone  15 mg Oral Daily  . Chlorhexidine Gluconate Cloth  6 each Topical Daily  . feeding supplement  1 Container Oral BID BM  . feeding supplement (PRO-STAT SUGAR FREE 64)  30 mL Oral BID  . furosemide  20 mg Intravenous Once  . insulin aspart  0-15 Units Subcutaneous TID WC  . insulin aspart  0-5 Units Subcutaneous QHS  . insulin glargine  20 Units Subcutaneous Daily  . levothyroxine  50 mcg Oral Daily  . mouth rinse  15 mL Mouth Rinse BID  . metoprolol succinate  50 mg Oral Daily  . multivitamin with minerals  1 tablet Oral Daily  .  QUEtiapine  100 mg Oral QHS  . zolpidem  5 mg Oral QHS   Continuous Infusions: . sodium chloride Stopped (11/28/19 1221)  . sodium chloride 75 mL/hr at 11/28/19 1900  . lactated ringers 1,000 mL (11/29/19 0755)     LOS: 16 days     Cordelia Poche, MD [facility] 11/29/2019, 1:43 PM  If 7PM-7AM, please contact night-coverage [email or web address]

## 2019-11-29 NOTE — Brief Op Note (Signed)
No active bleeding and no blood products seen in colon. Hemoclips intact in proximal colon without any bleeding stigmata. No blood or AVMs seen in terminal ileum. Awaiting repeat capsule endoscopy results. Full liquid diet.

## 2019-11-29 NOTE — Progress Notes (Signed)
Patient ID: Sue [name], female   DOB: [birth year], 69 y.o.   MRN: [id number]  Multiple nonbleeding small bowel AVMs; Gastritis; Blood-tinged stool in colon without a source identified. Colonoscopy done day after capsule did not show any blood in the colon or bleeding. If rebleeding recurs, then will need a double/single balloon enteroscopy at a tertiary care center. Supportive care. Follow H/Hs. [facility] will f/u.

## 2019-11-29 NOTE — Progress Notes (Signed)
PCCM progress note   Sue [name] is a 69 year old female under the care of [facility] for the  treatment of a persistent GI bleeding. She has underwent multiple scopes with GI and received multiple blood transfusions during her prolonged hospital stay. Afternoon 11/29/2019 patient unfortunately lost peripheral IV access and PCCM was contacted by primary physician to assist in obtaining access. I was able to place 2 PIV's with ultrasound guidance. RN was made aware of IV access. Please contact PCCM if any further assistance is needed.   Johnsie Cancel, NP-C [facility] Contact / Pager information can be found on Amion  11/29/2019, 3:09 PM

## 2019-11-30 LAB — CBC
HCT: 26.8 % — ABNORMAL LOW (ref 36.0–46.0)
Hemoglobin: 8.4 g/dL — ABNORMAL LOW (ref 12.0–15.0)
MCH: 30.3 pg (ref 26.0–34.0)
MCHC: 31.3 g/dL (ref 30.0–36.0)
MCV: 96.8 fL (ref 80.0–100.0)
Platelets: 128 10*3/uL — ABNORMAL LOW (ref 150–400)
RBC: 2.77 MIL/uL — ABNORMAL LOW (ref 3.87–5.11)
RDW: 15.4 % (ref 11.5–15.5)
WBC: 9.4 10*3/uL (ref 4.0–10.5)
nRBC: 0.3 % — ABNORMAL HIGH (ref 0.0–0.2)

## 2019-11-30 LAB — BASIC METABOLIC PANEL
Anion gap: 3 — ABNORMAL LOW (ref 5–15)
BUN: 13 mg/dL (ref 8–23)
CO2: 26 mmol/L (ref 22–32)
Calcium: 7.4 mg/dL — ABNORMAL LOW (ref 8.9–10.3)
Chloride: 112 mmol/L — ABNORMAL HIGH (ref 98–111)
Creatinine, Ser: 1.11 mg/dL — ABNORMAL HIGH (ref 0.44–1.00)
GFR calc Af Amer: 60 mL/min — ABNORMAL LOW (ref >60)
GFR calc non Af Amer: 51 mL/min — ABNORMAL LOW (ref >60)
Glucose, Bld: 103 mg/dL — ABNORMAL HIGH (ref 70–99)
Potassium: 4.5 mmol/L (ref 3.5–5.1)
Sodium: 141 mmol/L (ref 135–145)

## 2019-11-30 LAB — GLUCOSE, CAPILLARY
Glucose-Capillary: 67 mg/dL — ABNORMAL LOW (ref 70–99)
Glucose-Capillary: 70 mg/dL (ref 70–99)
Glucose-Capillary: 107 mg/dL — ABNORMAL HIGH (ref 70–99)
Glucose-Capillary: 66 mg/dL — ABNORMAL LOW (ref 70–99)
Glucose-Capillary: 88 mg/dL (ref 70–99)
Glucose-Capillary: 95 mg/dL (ref 70–99)
Glucose-Capillary: 109 mg/dL — ABNORMAL HIGH (ref 70–99)

## 2019-11-30 LAB — HEMOGLOBIN AND HEMATOCRIT, BLOOD
HCT: 23.8 % — ABNORMAL LOW (ref 36.0–46.0)
HCT: 25.6 % — ABNORMAL LOW (ref 36.0–46.0)
Hemoglobin: 7.7 g/dL — ABNORMAL LOW (ref 12.0–15.0)
Hemoglobin: 8.1 g/dL — ABNORMAL LOW (ref 12.0–15.0)

## 2019-11-30 LAB — MAGNESIUM: Magnesium: 1.9 mg/dL (ref 1.7–2.4)

## 2019-11-30 MED ORDER — INSULIN GLARGINE 100 UNIT/ML ~~LOC~~ SOLN
16.0000 [IU] | Freq: Every day | SUBCUTANEOUS | Status: DC
  Administered 2019-12-01 – 2019-12-02 (×2): 16 [IU] via SUBCUTANEOUS
  Filled 2019-11-30 (×2): qty 0.16

## 2019-11-30 NOTE — Plan of Care (Signed)
  Problem: Health Behavior/Discharge Planning: Goal: Ability to manage health-related needs will improve Outcome: Progressing   Problem: Clinical Measurements: Goal: Ability to maintain clinical measurements within normal limits will improve Outcome: Progressing   Problem: Clinical Measurements: Goal: Will remain free from infection Outcome: Progressing   Problem: Clinical Measurements: Goal: Diagnostic test results will improve Outcome: Progressing   Problem: Clinical Measurements: Goal: Respiratory complications will improve Outcome: Progressing   Problem: Coping: Goal: Level of anxiety will decrease Outcome: Progressing   Problem: Safety: Goal: Ability to remain free from injury will improve Outcome: Progressing   Problem: Skin Integrity: Goal: Risk for impaired skin integrity will decrease Outcome: Progressing

## 2019-11-30 NOTE — Progress Notes (Signed)
PROGRESS NOTE    SHELLENE SWEIGERT  SEG:[id number] DOB: 04/15/1951 DOA: 11/13/2019 PCP: Ann Held, DO   Brief Narrative: Sue [name] is a 69 y.o. female with a prolonged hospitalization secondary to continued GI bleeding of unknown source. Patient has a history of DM2, HTN, COPD on 3 L nasal cannula, morbid obesity. Patient presented secondary to bright red blood per rectum and severe anemia. She has had multiple endoscopy procedures to attempt to localize the source of bleeding without success to date. She continues to require blood transfusions regularly for recurrent/ongoing GI bleeding.   Assessment & Plan:   Principal Problem:   Lower GI bleed Active Problems:   HTN (hypertension)   Chronic respiratory failure (HCC)   Morbid obesity due to excess calories (HCC)   Diabetes mellitus type II, uncontrolled (HCC)   CKD (chronic kidney disease) stage 3, GFR 30-59 ml/min   Acute blood loss anemia   GI bleed Unknown source. [facility] on board. Patient has had multiple endoscopies including colonoscopy, push enteroscopy, capsule endoscopy without revelation of source. She has also had a bleeding scan which was unrevealing for source. To date, she has received 17 units of PRBC to date. Hemoglobin stable and GI bleeding has resolved for the past two days -GI recommendations: no bleeding source found -Serial CBC/H&H -Transfuse for hemoglobin <7  Acute blood loss anemia Secondary to above.  Chest pain Atypical. EKG obtained overnight sinus rhythm with some mild lateral lead t-wave flattening which appears unchanged from earlier this month. Chest discomfort resolved with adjustment of nasal canula (previous device was not delivering oxygen correctly). Chest x-ray obtained and is unremarkable for acute process. Hepatic function panel was negative.  Chronic respiratory failure with hypoxia Patient is on 3L via nasal canula.  Hypotension Secondary to acute blood loss. Improved  with IV fluids.  CKD stage IIIb Baseline creatinine of 1.5 per chart review.  Essential hypertension -Continue metoprolol XL 50 mg daily  Diabetes mellitus, type 2 Some hypoglycemia -Decrease to Lantus 16 units -Continue SSI  Chronic diastolic heart failure Euvolemic. Chest x-ray clear today.  Depression Anxiety Continue Buspar, Seroquel  Hypothyroidism -Continue Synthroid  Morbid obesity Body mass index is 47.84 kg/m.   DVT prophylaxis: SCDs Code Status:   Code Status: Full Code Family Communication: None at bedside Disposition Plan: Discharge pending continued management of active GI bleeding   Consultants:   [facility]  Procedures:   COLONOSCOPY (3/9) Impression:               - Old blood in the entire examined colon.                           - Friability with spontaneous minimal bleeding in                            what I believe was the cecum/appendiceal orifice                            region. Clips were placed. Tattooed.                           - No specimens collected.                           -  It is unclear whether the observed oozing                            accounts for the patient's continuing blood loss,                            and/or whether it is a localized problem or                            indicative of a more generalized process.  Recommendation:           - To visualize the small bowel, perform video                            capsule endoscopy today.   COLONOSCOPY (3/12) Impression:               - Preparation of the colon was fair.                           - No active bleeding seen.                           - A single erosion with adherent blood was seen in                            the proximal ascending colon. Clip was placed.                           - No specimens collected.  Recommendation:           - Repeat colonoscopy tomorrow for retreatment.   COLONOSCOPY (3/13) Impression:                - Preparation of the colon was fair.                           - Blood in the entire examined colon.                           - The examined portion of the ileum was normal.                           - The distal rectum and anal verge are normal on                            retroflexion view.                           - No specimens collected.  Recommendation:           - Clear liquid diet.                           - NM GI bleeding scan for obscure overt GI bleeding.   PUSH ENTEROSCOPY (3/14) Findings: Normal esophagus, normal stomach, normal duodenal bulb and duodenum. No evidence of active or recent bleeding unlike seen  on GI bleeding scan. 2 small AVMs noted in proximal jejunum, treated with APC. A small bowel PillCam was deployed in the duodenal bulb.   Recommendations: N.p.o. for now. Okay to start clear liquid diet 2 hours later. Await small bowel PillCam results. Monitor H&H and transfuse as needed.   PUSH ENTEROSCOPY (3/17) Impression:               - Normal esophagus.                           - Z-line, 42 cm from the incisors.                           - Acute gastritis.                           - Two non-bleeding angiodysplastic lesions in the                            duodenum. Treated with argon plasma coagulation                            (APC).                           - A single non-bleeding angiodysplastic lesion in                            the duodenum. Treated with argon plasma coagulation                            (APC).                           - A single non-bleeding angiodysplastic lesion in                            the jejunum. Treated with argon plasma coagulation                            (APC).                           - No specimens collected. Recommendation:           - Capsule endoscopy tomorrow and repeat colonoscopy                            on 11/29/19. If no source found then may need a deep                            small  bowel enteroscopy at a tertiary care center.   CAPSULE ENDOSCOPY   COLONOSCOPY (3/19) Impression:               - Diverticulosis in the ascending colon.                           - A tattoo was  seen in the ascending colon. The                            tattoo site appeared normal.                           - Internal hemorrhoids.                           - The examined portion of the ileum was normal.                           - No specimens collected.  Recommendation:           - Patient has a contact number available for                            emergencies. The signs and symptoms of potential                            delayed complications were discussed with the                            patient. Return to normal activities tomorrow.                            Written discharge instructions were provided to the                            patient.                           - Repeat colonoscopy in 5 years for screening                            purposes.  Antimicrobials:  None    Subjective: No bleeding overnight  Objective: Vitals:   11/30/19 0056 11/30/19 0335 11/30/19 0805 11/30/19 0821  BP: 129/64 110/75 (!) 126/48   Pulse: 86 91 81   Resp: (!) 24 (!) 28 (!) 21   Temp:    97.6 F (36.4 C)  TempSrc:    Oral  SpO2: 100% 99% 100%   Weight:      Height:        Intake/Output Summary (Last 24 hours) at 11/30/2019 0939 Last data filed at 11/30/2019 0000 Gross per 24 hour  Intake 820.89 ml  Output --  Net 820.89 ml   Filed Weights   11/13/19 0330 11/24/19 1036 11/29/19 0747  Weight: 122.5 kg 122.5 kg 122.5 kg    Examination:  General exam: Appears calm and comfortable Respiratory system: Clear to auscultation. Respiratory effort normal. Cardiovascular system: S1 & S2 heard, RRR. No murmurs, rubs, gallops or clicks. Gastrointestinal system: Abdomen is nondistended, soft and nontender. No organomegaly or masses felt. Normal bowel sounds heard. Central  nervous system: Alert and oriented. No focal neurological deficits. Extremities: No edema. No calf tenderness Skin: No cyanosis. No rashes Psychiatry: Judgement and insight appear normal. Mood & affect appropriate.     Data Reviewed: I have personally reviewed following labs and  imaging studies  CBC: Recent Labs  Lab 11/26/19 2029 11/26/19 2029 11/27/19 0842 11/27/19 1711 11/28/19 0438 11/28/19 1312 11/28/19 2159 11/29/19 0159 11/29/19 1256 11/29/19 2117 11/30/19 0147  WBC 8.7  --  11.4*  --  12.9*  --   --  10.0  --   --  9.4  HGB 6.1*   < > 8.0*   < > 8.7*   < > 9.1* 8.3* 8.3* 8.7* 8.4*  HCT 19.2*   < > 23.5*   < > 25.6*   < > 27.0* 25.6* 26.2* 27.8* 26.8*  MCV 92.3  --  89.0  --  91.1  --   --  93.8  --   --  96.8  PLT 143*  --  118*  --  104*  --   --  109*  --   --  128*   < > = values in this interval not displayed.   Basic Metabolic Panel: Recent Labs  Lab 11/26/19 0600 11/27/19 0842 11/28/19 0438 11/29/19 0159 11/30/19 0147  NA 142 140 142 144 141  K 4.2 4.8 4.0 4.5 4.5  CL 112* 112* 113* 115* 112*  CO2 24 24 25 27 26   GLUCOSE 100* 171* 100* 92 103*  BUN 10 17 17 15 13   CREATININE 1.17* 1.36* 1.27* 1.28* 1.11*  CALCIUM 7.6* 7.2* 7.6* 7.6* 7.4*  MG  --  1.7 1.7 1.9 1.9   GFR: Estimated Creatinine Clearance: 62.4 mL/min (A) (by C-G formula based on SCr of 1.11 mg/dL (H)). Liver Function Tests: Recent Labs  Lab 11/27/19 1030  AST 14*  ALT 10  ALKPHOS 69  BILITOT 0.5  PROT 4.0*  ALBUMIN 1.8*   No results for input(s): LIPASE, AMYLASE in the last 168 hours. No results for input(s): AMMONIA in the last 168 hours. Coagulation Profile: Recent Labs  Lab 11/23/19 2225  INR 1.2   Cardiac Enzymes: No results for input(s): CKTOTAL, CKMB, CKMBINDEX, TROPONINI in the last 168 hours. BNP (last 3 results) No results for input(s): PROBNP in the last 8760 hours. HbA1C: No results for input(s): HGBA1C in the last 72 hours. CBG: Recent Labs  Lab  11/29/19 1028 11/29/19 1101 11/29/19 1638 11/29/19 2109 11/30/19 0736  GLUCAP 67* 70 121* 104* 107*   Lipid Profile: No results for input(s): CHOL, HDL, LDLCALC, TRIG, CHOLHDL, LDLDIRECT in the last 72 hours. Thyroid Function Tests: No results for input(s): TSH, T4TOTAL, FREET4, T3FREE, THYROIDAB in the last 72 hours. Anemia Panel: No results for input(s): VITAMINB12, FOLATE, FERRITIN, TIBC, IRON, RETICCTPCT in the last 72 hours. Sepsis Labs: No results for input(s): PROCALCITON, LATICACIDVEN in the last 168 hours.  No results found for this or any previous visit (from the past 240 hour(s)).       Radiology Studies: No results found.      Scheduled Meds: . sodium chloride   Intravenous Once  . sodium chloride   Intravenous Once  . sodium chloride   Intravenous Once  . busPIRone  15 mg Oral Daily  . Chlorhexidine Gluconate Cloth  6 each Topical Daily  . feeding supplement  1 Container Oral BID BM  . feeding supplement (PRO-STAT SUGAR FREE 64)  30 mL Oral BID  . furosemide  20 mg Intravenous Once  . insulin aspart  0-15 Units Subcutaneous TID WC  . insulin aspart  0-5 Units Subcutaneous QHS  . insulin glargine  20 Units Subcutaneous Daily  . levothyroxine  50 mcg Oral Daily  . mouth rinse  15 mL Mouth Rinse BID  . metoprolol succinate  50 mg Oral Daily  . multivitamin with minerals  1 tablet Oral Daily  . QUEtiapine  100 mg Oral QHS  . zolpidem  5 mg Oral QHS   Continuous Infusions: . sodium chloride Stopped (11/28/19 1221)  . sodium chloride 75 mL/hr at 11/30/19 0000  . lactated ringers 1,000 mL (11/29/19 0755)     LOS: 17 days     Cordelia Poche, MD [facility] 11/30/2019, 9:39 AM  If 7PM-7AM, please contact night-coverage [email or web address]

## 2019-12-01 LAB — HEMOGLOBIN AND HEMATOCRIT, BLOOD
HCT: 25.3 % — ABNORMAL LOW (ref 36.0–46.0)
HCT: 27.3 % — ABNORMAL LOW (ref 36.0–46.0)
Hemoglobin: 7.8 g/dL — ABNORMAL LOW (ref 12.0–15.0)
Hemoglobin: 8.5 g/dL — ABNORMAL LOW (ref 12.0–15.0)

## 2019-12-01 LAB — CBC WITH DIFFERENTIAL/PLATELET
Abs Immature Granulocytes: 0.05 10*3/uL (ref 0.00–0.07)
Basophils Absolute: 0 10*3/uL (ref 0.0–0.1)
Basophils Relative: 0 %
Eosinophils Absolute: 0.5 10*3/uL (ref 0.0–0.5)
Eosinophils Relative: 5 %
HCT: 23.1 % — ABNORMAL LOW (ref 36.0–46.0)
Hemoglobin: 7.5 g/dL — ABNORMAL LOW (ref 12.0–15.0)
Immature Granulocytes: 1 %
Lymphocytes Relative: 27 %
Lymphs Abs: 2.3 10*3/uL (ref 0.7–4.0)
MCH: 30.1 pg (ref 26.0–34.0)
MCHC: 32.5 g/dL (ref 30.0–36.0)
MCV: 92.8 fL (ref 80.0–100.0)
Monocytes Absolute: 0.5 10*3/uL (ref 0.1–1.0)
Monocytes Relative: 6 %
Neutro Abs: 5.2 10*3/uL (ref 1.7–7.7)
Neutrophils Relative %: 61 %
Platelets: 126 10*3/uL — ABNORMAL LOW (ref 150–400)
RBC: 2.49 MIL/uL — ABNORMAL LOW (ref 3.87–5.11)
RDW: 15.1 % (ref 11.5–15.5)
WBC: 8.5 10*3/uL (ref 4.0–10.5)
nRBC: 0 % (ref 0.0–0.2)

## 2019-12-01 LAB — BASIC METABOLIC PANEL
Anion gap: 3 — ABNORMAL LOW (ref 5–15)
BUN: 11 mg/dL (ref 8–23)
CO2: 23 mmol/L (ref 22–32)
Calcium: 7.4 mg/dL — ABNORMAL LOW (ref 8.9–10.3)
Chloride: 113 mmol/L — ABNORMAL HIGH (ref 98–111)
Creatinine, Ser: 1.05 mg/dL — ABNORMAL HIGH (ref 0.44–1.00)
GFR calc Af Amer: >60 mL/min (ref >60)
GFR calc non Af Amer: 55 mL/min — ABNORMAL LOW (ref >60)
Glucose, Bld: 97 mg/dL (ref 70–99)
Potassium: 4.5 mmol/L (ref 3.5–5.1)
Sodium: 139 mmol/L (ref 135–145)

## 2019-12-01 LAB — GLUCOSE, CAPILLARY
Glucose-Capillary: 93 mg/dL (ref 70–99)
Glucose-Capillary: 87 mg/dL (ref 70–99)
Glucose-Capillary: 123 mg/dL — ABNORMAL HIGH (ref 70–99)
Glucose-Capillary: 90 mg/dL (ref 70–99)

## 2019-12-01 LAB — MAGNESIUM: Magnesium: 1.8 mg/dL (ref 1.7–2.4)

## 2019-12-01 NOTE — Plan of Care (Signed)
  Problem: Education: Goal: Knowledge of General Education information will improve Description: Including pain rating scale, medication(s)/side effects and non-pharmacologic comfort measures Outcome: Progressing   Problem: Health Behavior/Discharge Planning: Goal: Ability to manage health-related needs will improve Outcome: Progressing   Problem: Clinical Measurements: Goal: Ability to maintain clinical measurements within normal limits will improve Outcome: Progressing Goal: Will remain free from infection Outcome: Progressing Goal: Diagnostic test results will improve Outcome: Progressing Goal: Respiratory complications will improve Outcome: Progressing Goal: Cardiovascular complication will be avoided Outcome: Progressing   Problem: Activity: Goal: Risk for activity intolerance will decrease Outcome: Progressing   Problem: Nutrition: Goal: Adequate nutrition will be maintained Outcome: Progressing   Problem: Coping: Goal: Level of anxiety will decrease Outcome: Progressing   Problem: Elimination: Goal: Will not experience complications related to bowel motility Outcome: Progressing Goal: Will not experience complications related to urinary retention Outcome: Progressing   Problem: Pain Managment: Goal: General experience of comfort will improve Outcome: Progressing   Problem: Safety: Goal: Ability to remain free from injury will improve Outcome: Progressing   Problem: Skin Integrity: Goal: Risk for impaired skin integrity will decrease Outcome: Progressing   Problem: Education: Goal: Ability to describe self-care measures that may prevent or decrease complications (Diabetes Survival Skills Education) will improve Outcome: Progressing   Problem: Coping: Goal: Ability to adjust to condition or change in health will improve Outcome: Progressing   Problem: Fluid Volume: Goal: Ability to maintain a balanced intake and output will improve Outcome:  Progressing   Problem: Metabolic: Goal: Ability to maintain appropriate glucose levels will improve Outcome: Progressing   Problem: Nutritional: Goal: Maintenance of adequate nutrition will improve Outcome: Progressing Goal: Progress toward achieving an optimal weight will improve Outcome: Progressing   Problem: Skin Integrity: Goal: Risk for impaired skin integrity will decrease Outcome: Progressing   Problem: Tissue Perfusion: Goal: Adequacy of tissue perfusion will improve Outcome: Progressing   Problem: Education: Goal: Knowledge of risk factors and measures for prevention of condition will improve Outcome: Progressing   Problem: Coping: Goal: Psychosocial and spiritual needs will be supported Outcome: Progressing   Problem: Respiratory: Goal: Will maintain a patent airway Outcome: Progressing Goal: Complications related to the disease process, condition or treatment will be avoided or minimized Outcome: Progressing   Problem: Education: Goal: Ability to identify signs and symptoms of gastrointestinal bleeding will improve Outcome: Progressing   Problem: Bowel/Gastric: Goal: Will show no signs and symptoms of gastrointestinal bleeding Outcome: Progressing   Problem: Fluid Volume: Goal: Will show no signs and symptoms of excessive bleeding Outcome: Progressing   Problem: Clinical Measurements: Goal: Complications related to the disease process, condition or treatment will be avoided or minimized Outcome: Progressing

## 2019-12-01 NOTE — Progress Notes (Signed)
No reported further bleeding.  Continue current management.

## 2019-12-01 NOTE — Progress Notes (Signed)
PROGRESS NOTE    ARRYANA TOLLESON  UJW:[id number] DOB: 06-Mar-1951 DOA: 11/13/2019 PCP: Ann Held, DO   Brief Narrative: Sue [name] is a 69 y.o. female with a prolonged hospitalization secondary to continued GI bleeding of unknown source. Patient has a history of DM2, HTN, COPD on 3 L nasal cannula, morbid obesity. Patient presented secondary to bright red blood per rectum and severe anemia. She has had multiple endoscopy procedures to attempt to localize the source of bleeding without success to date. She continues to require blood transfusions regularly for recurrent/ongoing GI bleeding.   Assessment & Plan:   Principal Problem:   Lower GI bleed Active Problems:   HTN (hypertension)   Chronic respiratory failure (HCC)   Morbid obesity due to excess calories (HCC)   Diabetes mellitus type II, uncontrolled (HCC)   CKD (chronic kidney disease) stage 3, GFR 30-59 ml/min   Acute blood loss anemia   GI bleed Unknown source. [facility] on board. Patient has had multiple endoscopies including colonoscopy, push enteroscopy, capsule endoscopy without revelation of source. She has also had a bleeding scan which was unrevealing for source. To date, she has received 17 units of PRBC to date. Hemoglobin stable and GI bleeding has resolved for the past three days -GI recommendations: no bleeding source found -Serial CBC/H&H -Transfuse for hemoglobin <7 -PT/OT eval  Acute blood loss anemia Secondary to above.  Chest pain Atypical. EKG obtained overnight sinus rhythm with some mild lateral lead t-wave flattening which appears unchanged from earlier this month. Chest discomfort resolved with adjustment of nasal canula (previous device was not delivering oxygen correctly). Chest x-ray obtained and is unremarkable for acute process. Hepatic function panel was negative.  Chronic respiratory failure with hypoxia Patient is on 3L via nasal canula.  Hypotension Secondary to acute blood  loss. Improved with IV fluids.  CKD stage IIIb Baseline creatinine of 1.5 per chart review.  Essential hypertension -Continue metoprolol XL 50 mg daily  Diabetes mellitus, type 2 Some hypoglycemia previously. Decreased Lantus dose on 3/20 -Continue Lantus 16 units daily -Continue SSI  Chronic diastolic heart failure Euvolemic. Chest x-ray clear.  Depression Anxiety Continue Buspar, Seroquel  Hypothyroidism -Continue Synthroid  Morbid obesity Body mass index is 47.84 kg/m.   DVT prophylaxis: SCDs Code Status:   Code Status: Full Code Family Communication: None at bedside Disposition Plan: Discharge pending continued management of active GI bleeding   Consultants:   [facility]  Procedures:   COLONOSCOPY (3/9) Impression:               - Old blood in the entire examined colon.                           - Friability with spontaneous minimal bleeding in                            what I believe was the cecum/appendiceal orifice                            region. Clips were placed. Tattooed.                           - No specimens collected.                           -  It is unclear whether the observed oozing                            accounts for the patient's continuing blood loss,                            and/or whether it is a localized problem or                            indicative of a more generalized process.  Recommendation:           - To visualize the small bowel, perform video                            capsule endoscopy today.   COLONOSCOPY (3/12) Impression:               - Preparation of the colon was fair.                           - No active bleeding seen.                           - A single erosion with adherent blood was seen in                            the proximal ascending colon. Clip was placed.                           - No specimens collected.  Recommendation:           - Repeat colonoscopy tomorrow for  retreatment.   COLONOSCOPY (3/13) Impression:               - Preparation of the colon was fair.                           - Blood in the entire examined colon.                           - The examined portion of the ileum was normal.                           - The distal rectum and anal verge are normal on                            retroflexion view.                           - No specimens collected.  Recommendation:           - Clear liquid diet.                           - NM GI bleeding scan for obscure overt GI bleeding.   PUSH ENTEROSCOPY (3/14) Findings: Normal esophagus, normal stomach, normal duodenal bulb and duodenum. No evidence of active or recent bleeding unlike seen  on GI bleeding scan. 2 small AVMs noted in proximal jejunum, treated with APC. A small bowel PillCam was deployed in the duodenal bulb.   Recommendations: N.p.o. for now. Okay to start clear liquid diet 2 hours later. Await small bowel PillCam results. Monitor H&H and transfuse as needed.   PUSH ENTEROSCOPY (3/17) Impression:               - Normal esophagus.                           - Z-line, 42 cm from the incisors.                           - Acute gastritis.                           - Two non-bleeding angiodysplastic lesions in the                            duodenum. Treated with argon plasma coagulation                            (APC).                           - A single non-bleeding angiodysplastic lesion in                            the duodenum. Treated with argon plasma coagulation                            (APC).                           - A single non-bleeding angiodysplastic lesion in                            the jejunum. Treated with argon plasma coagulation                            (APC).                           - No specimens collected. Recommendation:           - Capsule endoscopy tomorrow and repeat colonoscopy                            on 11/29/19. If no  source found then may need a deep                            small bowel enteroscopy at a tertiary care center.   CAPSULE ENDOSCOPY   COLONOSCOPY (3/19) Impression:               - Diverticulosis in the ascending colon.                           - A tattoo was  seen in the ascending colon. The                            tattoo site appeared normal.                           - Internal hemorrhoids.                           - The examined portion of the ileum was normal.                           - No specimens collected.  Recommendation:           - Patient has a contact number available for                            emergencies. The signs and symptoms of potential                            delayed complications were discussed with the                            patient. Return to normal activities tomorrow.                            Written discharge instructions were provided to the                            patient.                           - Repeat colonoscopy in 5 years for screening                            purposes.  Antimicrobials:  None    Subjective: No bleeding overnight. Feeling weaker lying in the bed all day.  Objective: Vitals:   11/30/19 1104 11/30/19 1328 11/30/19 2050 12/01/19 0648  BP: (!) 128/92 116/68 (!) 148/71 107/66  Pulse: 85 70 89 93  Resp: 18 14 20 20   Temp: 98.3 F (36.8 C) 97.9 F (36.6 C) 98.7 F (37.1 C) 98.8 F (37.1 C)  TempSrc: Oral Oral Oral Oral  SpO2: 100% 100% 100% 100%  Weight:      Height:        Intake/Output Summary (Last 24 hours) at 12/01/2019 0856 Last data filed at 12/01/2019 0600 Gross per 24 hour  Intake 3451.79 ml  Output 500 ml  Net 2951.79 ml   Filed Weights   11/13/19 0330 11/24/19 1036 11/29/19 0747  Weight: 122.5 kg 122.5 kg 122.5 kg    Examination:  General exam: Appears calm and comfortable Respiratory system: Clear to auscultation. Respiratory effort normal. Cardiovascular system: S1 & S2  heard, RRR. No murmurs, rubs, gallops or clicks. Gastrointestinal system: Abdomen is nondistended, soft and nontender. No organomegaly or masses felt. Normal bowel sounds heard. Central nervous system: Alert and oriented. No focal neurological deficits. Extremities: 1+ LE edema and 1+ UE edema. No calf tenderness Skin: No cyanosis. No rashes Psychiatry: Judgement  and insight appear normal. Mood & affect appropriate.     Data Reviewed: I have personally reviewed following labs and imaging studies  CBC: Recent Labs  Lab 11/27/19 0842 11/27/19 1711 11/28/19 0438 11/28/19 1312 11/29/19 0159 11/29/19 1256 11/29/19 2117 11/30/19 0147 11/30/19 1315 11/30/19 2120 12/01/19 0631  WBC 11.4*  --  12.9*  --  10.0  --   --  9.4  --   --  8.5  NEUTROABS  --   --   --   --   --   --   --   --   --   --  5.2  HGB 8.0*   < > 8.7*   < > 8.3*   < > 8.7* 8.4* 7.7* 8.1* 7.5*  HCT 23.5*   < > 25.6*   < > 25.6*   < > 27.8* 26.8* 23.8* 25.6* 23.1*  MCV 89.0  --  91.1  --  93.8  --   --  96.8  --   --  92.8  PLT 118*  --  104*  --  109*  --   --  128*  --   --  126*   < > = values in this interval not displayed.   Basic Metabolic Panel: Recent Labs  Lab 11/27/19 0842 11/28/19 0438 11/29/19 0159 11/30/19 0147 12/01/19 0419  NA 140 142 144 141 139  K 4.8 4.0 4.5 4.5 4.5  CL 112* 113* 115* 112* 113*  CO2 24 25 27 26 23   GLUCOSE 171* 100* 92 103* 97  BUN 17 17 15 13 11   CREATININE 1.36* 1.27* 1.28* 1.11* 1.05*  CALCIUM 7.2* 7.6* 7.6* 7.4* 7.4*  MG 1.7 1.7 1.9 1.9 1.8   GFR: Estimated Creatinine Clearance: 66 mL/min (A) (by C-G formula based on SCr of 1.05 mg/dL (H)). Liver Function Tests: Recent Labs  Lab 11/27/19 1030  AST 14*  ALT 10  ALKPHOS 69  BILITOT 0.5  PROT 4.0*  ALBUMIN 1.8*   No results for input(s): LIPASE, AMYLASE in the last 168 hours. No results for input(s): AMMONIA in the last 168 hours. Coagulation Profile: No results for input(s): INR, PROTIME in the last 168  hours. Cardiac Enzymes: No results for input(s): CKTOTAL, CKMB, CKMBINDEX, TROPONINI in the last 168 hours. BNP (last 3 results) No results for input(s): PROBNP in the last 8760 hours. HbA1C: No results for input(s): HGBA1C in the last 72 hours. CBG: Recent Labs  Lab 11/30/19 1111 11/30/19 1146 11/30/19 1642 11/30/19 2249 12/01/19 0815  GLUCAP 66* 88 95 109* 93   Lipid Profile: No results for input(s): CHOL, HDL, LDLCALC, TRIG, CHOLHDL, LDLDIRECT in the last 72 hours. Thyroid Function Tests: No results for input(s): TSH, T4TOTAL, FREET4, T3FREE, THYROIDAB in the last 72 hours. Anemia Panel: No results for input(s): VITAMINB12, FOLATE, FERRITIN, TIBC, IRON, RETICCTPCT in the last 72 hours. Sepsis Labs: No results for input(s): PROCALCITON, LATICACIDVEN in the last 168 hours.  No results found for this or any previous visit (from the past 240 hour(s)).       Radiology Studies: No results found.      Scheduled Meds: . sodium chloride   Intravenous Once  . sodium chloride   Intravenous Once  . sodium chloride   Intravenous Once  . busPIRone  15 mg Oral Daily  . Chlorhexidine Gluconate Cloth  6 each Topical Daily  . feeding supplement  1 Container Oral BID BM  . feeding supplement (PRO-STAT SUGAR FREE 64)  30 mL Oral BID  . furosemide  20 mg Intravenous Once  . insulin aspart  0-15 Units Subcutaneous TID WC  . insulin aspart  0-5 Units Subcutaneous QHS  . insulin glargine  16 Units Subcutaneous Daily  . levothyroxine  50 mcg Oral Daily  . mouth rinse  15 mL Mouth Rinse BID  . metoprolol succinate  50 mg Oral Daily  . multivitamin with minerals  1 tablet Oral Daily  . QUEtiapine  100 mg Oral QHS  . zolpidem  5 mg Oral QHS   Continuous Infusions: . sodium chloride Stopped (11/28/19 1221)  . sodium chloride 75 mL/hr at 11/30/19 1901  . lactated ringers 1,000 mL (11/29/19 0755)     LOS: 18 days     Cordelia Poche, MD [facility] 12/01/2019, 8:56  AM  If 7PM-7AM, please contact night-coverage [email or web address]

## 2019-12-02 LAB — GLUCOSE, CAPILLARY
Glucose-Capillary: 76 mg/dL (ref 70–99)
Glucose-Capillary: 80 mg/dL (ref 70–99)

## 2019-12-02 LAB — CBC
HCT: 25.2 % — ABNORMAL LOW (ref 36.0–46.0)
Hemoglobin: 7.8 g/dL — ABNORMAL LOW (ref 12.0–15.0)
MCH: 30 pg (ref 26.0–34.0)
MCHC: 31 g/dL (ref 30.0–36.0)
MCV: 96.9 fL (ref 80.0–100.0)
Platelets: 142 10*3/uL — ABNORMAL LOW (ref 150–400)
RBC: 2.6 MIL/uL — ABNORMAL LOW (ref 3.87–5.11)
RDW: 14.7 % (ref 11.5–15.5)
WBC: 8.7 10*3/uL (ref 4.0–10.5)
nRBC: 0 % (ref 0.0–0.2)

## 2019-12-02 LAB — HEMOGLOBIN AND HEMATOCRIT, BLOOD
HCT: 25.5 % — ABNORMAL LOW (ref 36.0–46.0)
Hemoglobin: 8 g/dL — ABNORMAL LOW (ref 12.0–15.0)

## 2019-12-02 NOTE — Discharge Instructions (Signed)
Sue [name],  You were in the hospital because of GI bleeding. Unfortunately, this was a difficult bleed to control. You required several units of blood to replace the blood that you lost. You will need to follow-up with your PCP and your GI physician for follow-up. You will go home with home health. Please return if you have recurrent GI bleeding.

## 2019-12-02 NOTE — Evaluation (Signed)
Physical Therapy Evaluation Patient Details Name: Sue [name] MRN: [id number] DOB: 01/30/1951 Today's Date: 12/02/2019   History of Present Illness  69 year old female admitted with extensive hospital stay secondary to prolonged lower GI Bleed. PMH includes DM 2, HTN, COPD , on 3 LPM, [place] O2 at home, morbid obesity. Has had blood transfusion- and is noted to have CKD stage III.  Clinical Impression  Patient was resting in bed when PT arrived. She is to be discharged today- later this afternoon. She lives alone in senior "apt" type multiplex. Daughter lives close and helps with all driving, groceries and appts. She has been independent in all bathing and dressing. She is O2 dependent, 3 LPM, [place]- has RW, rollator and BS commode. Walker in shower, and standard height commode. Grab bars in shower and she has shower chair. She was instructed in HEP including LE and UE as detailed, and reminded to perform these at least 2 x daily, 10 reps each time. She has had HH in the past and this has been discussed with her Case manager/Social worker for final discharge planning. She is generally weak and at risk for falls. Should benefit from [facility] (both OT and PT) for optimal functional outcomes.     Follow Up Recommendations Home health PT    Equipment Recommendations  (Has all necessary equipement.)    Recommendations for Other Services       Precautions / Restrictions Precautions Precautions: Fall Precaution Comments: She is to be discharged to home today, remains a falls risk. Has all necessary equipment at home. Restrictions Weight Bearing Restrictions: No      Mobility  Bed Mobility Overal bed mobility: Needs Assistance Bed Mobility: Rolling;Sidelying to Sit;Supine to Sit;Sit to Supine;Sit to Sidelying Rolling: Min guard Sidelying to sit: Min guard Supine to sit: Min guard Sit to supine: Min guard Sit to sidelying: Min guard    Transfers Overall transfer level: Needs assistance Equipment  used: Rolling walker (2 wheeled) Transfers: Sit to/from Stand Sit to Stand: Min guard;Min assist            Ambulation/Gait Ambulation/Gait assistance: Min guard;Min assist Gait Distance (Feet): 10 Feet Assistive device: Rolling walker (2 wheeled) Gait Pattern/deviations: Step-to pattern;Shuffle;Wide base of support;Antalgic   Gait velocity interpretation: 1.31 - 2.62 ft/sec, indicative of limited community Conservation officer, historic buildings [place] (Stroke Patients Only)       Balance Overall balance assessment: Needs assistance   Sitting balance-Leahy Scale: Good       Standing balance-Leahy Scale: Fair                               Pertinent Vitals/Pain      Home Living Family/patient expects to be discharged to:: Private residence Living Arrangements: Alone Available Help at Discharge: Family;Available PRN/intermittently Type of Home: Apartment Home Access: Stairs to enter Entrance Stairs-Rails: Can reach both;Right;Left Entrance Stairs-Number of Steps: 2 Home Layout: One level(Lives in a "seniors multiplex type subdivision") Home Equipment: Walker - 2 wheels;Walker - 4 wheels;Shower seat;Bedside commode Additional Comments: She is oxygen dependent and on 3 LPM, [place]    Prior Function Level of Independence: Independent with assistive device(s)         Comments: She has a RW, and a rollator. Lives alone, but daughter assists with driving for groceries and appts,etc. Patient has had  HH in the past.     Hand Dominance   Dominant Hand: Right    Extremity/Trunk Assessment        Lower Extremity Assessment Lower Extremity Assessment: Generalized weakness    Cervical / Trunk Assessment Cervical / Trunk Assessment: Kyphotic  Communication   Communication: No difficulties  Cognition Arousal/Alertness: Awake/alert   Overall Cognitive Status: Within Functional Limits for tasks assessed                                  General Comments: She is at risk for falls.Very receptive to PT. Instructed in HEP and per RN she is to be discharged today- later this afternoon.      General Comments General comments (skin integrity, edema, etc.): Monitor skin and joint integrity closely- P+,F tone and turgor- she is morbidly obese    Exercises General Exercises - Lower Extremity Ankle Circles/Pumps: Seated;AROM;10 reps;Both Quad Sets: Seated;AROM;10 reps;Both Gluteal Sets: Seated;AROM;10 reps Short Arc Quad: Seated;AROM;Both;10 reps Heel Slides: AROM;Seated;Both;10 reps Hip ABduction/ADduction: Seated;AROM;Both;10 reps Other Exercises Other Exercises: reminded to perform these HEP exercises at least 2 x daily, 10 reps each. Printed sheet issued with pictures and commentary. Included Shoulder girdle flexion and abduction.   Assessment/Plan    PT Assessment Patient needs continued PT services  PT Problem List Decreased strength;Decreased activity tolerance       PT Treatment Interventions      PT Goals (Current goals can be found in the Care Plan section)  Acute Rehab PT Goals Patient Stated Goal: Just want to go home- my daughter will pick me up. PT Goal Formulation: All assessment and education complete, DC therapy(She is to continue with [facility] as she is being discharged today.) Time For Goal Achievement: 12/02/19 Potential to Achieve Goals: Good    Frequency (She is being discharged today. HH has been recommended.)   Barriers to discharge        Co-evaluation               AM-PAC PT "6 Clicks" Mobility  Outcome Measure Help needed turning from your back to your side while in a flat bed without using bedrails?: A Little Help needed moving from lying on your back to sitting on the side of a flat bed without using bedrails?: A Little Help needed moving to and from a bed to a chair (including a wheelchair)?: A Little Help needed standing up from a chair using your arms  (e.g., wheelchair or bedside chair)?: A Little Help needed to walk in hospital room?: A Little Help needed climbing 3-5 steps with a railing? : A Little 6 Click Score: 18    End of Session   Activity Tolerance: Patient tolerated treatment well Patient left: in chair;with call bell/phone within reach Nurse Communication: Mobility status PT Visit Diagnosis: Muscle weakness (generalized) (M62.81);Unsteadiness on feet (R26.81);Difficulty in walking, not elsewhere classified (R26.2)    Time: 1031-5945 PT Time Calculation (min) (ACUTE ONLY): 30 min   Charges:     PT Treatments $Therapeutic Exercise: 8-22 mins        Rollen Sox, PT # [phone] CGV cell  Casandra Doffing 12/02/2019, 12:01 PM

## 2019-12-02 NOTE — Evaluation (Signed)
Occupational Therapy Evaluation Patient Details Name: Sue [name] MRN: [id number] DOB: [birth year] Today's Date: 12/02/2019    History of Present Illness 69 year old female admitted with extensive hospital stay secondary to prolonged lower GI Bleed. PMH includes DM 2, HTN, COPD , on 3 LPM, [place] O2 at home, morbid obesity. Has had blood transfusion- and is noted to have CKD stage III.   Clinical Impression   Pt admitted with the above.  Pt currently with functional limitations due to the deficits listed below (see OT Problem List).  Pt will benefit from skilled OT to increase their safety and independence with ADL and functional mobility for ADL to facilitate discharge to venue listed below.   Pt will have family A as well as HH. Pt would also benefit from a reacher and a toilet aid. Pt agreed.  Plan is for pt to DC home this day- will defer OT tx to [place].     Follow Up Recommendations  Home health OT;Supervision/Assistance - 24 hour    Equipment Recommendations  None recommended by OT    Recommendations for Other Services       Precautions / Restrictions Precautions Precautions: Fall Precaution Comments: She is to be discharged to home today, remains a falls risk. Has all necessary equipment at home. Restrictions Weight Bearing Restrictions: No      Mobility Bed Mobility Overal bed mobility: Needs Assistance Bed Mobility: Rolling;Sidelying to Sit;Supine to Sit;Sit to Supine;Sit to Sidelying Rolling: Min guard Sidelying to sit: Min guard Supine to sit: Min guard Sit to supine: Min guard Sit to sidelying: Min guard General bed mobility comments: pt in chair  Transfers Overall transfer level: Needs assistance Equipment used: Rolling walker (2 wheeled) Transfers: Sit to/from [facility] Sit to Stand: Min guard;Min assist Stand pivot transfers: Min guard;Min assist            Balance Overall balance assessment: Needs assistance   Sitting  balance-Leahy Scale: Good       Standing balance-Leahy Scale: Fair                             ADL either performed or assessed with clinical judgement   ADL Overall ADL's : Needs assistance/impaired Eating/Feeding: Independent   Grooming: Standing;Set up   Upper Body Bathing: Set up;Sitting   Lower Body Bathing: Minimal assistance;Sit to/from stand;Cueing for safety;With caregiver independent assisting   Upper Body Dressing : Set up;Sitting   Lower Body Dressing: Minimal assistance;Sit to/from stand;Cueing for safety;With caregiver independent assisting   Toilet Transfer: Minimal assistance;BSC;Comfort height toilet;Ambulation;Cueing for safety   Toileting- Clothing Manipulation and Hygiene: Moderate assistance;Sit to/from stand;Cueing for safety;Cueing for compensatory techniques Toileting - Clothing Manipulation Details (indicate cue type and reason): will benefit from a toilet aid     Functional mobility during ADLs: Minimal assistance       Vision Patient Visual Report: No change from baseline           Hand Dominance Right   Extremity/Trunk Assessment Upper Extremity Assessment Upper Extremity Assessment: Generalized weakness   Lower Extremity Assessment Lower Extremity Assessment: Generalized weakness   Cervical / Trunk Assessment Cervical / Trunk Assessment: Kyphotic   Communication Communication Communication: No difficulties   Cognition Arousal/Alertness: Awake/alert   Overall Cognitive Status: Within Functional Limits for tasks assessed  General Comments: She is at risk for falls.Very receptive to PT. Instructed in HEP and per RN she is to be discharged today- later this afternoon.   General Comments  Monitor skin and joint integrity closely- P+,F tone and turgor- she is morbidly obese            Home Living Family/patient expects to be discharged to:: Private residence Living  Arrangements: Alone Available Help at Discharge: Family;Available PRN/intermittently Type of Home: Apartment Home Access: Stairs to enter Entrance Stairs-Number of Steps: 2 Entrance Stairs-Rails: Can reach both;Right;Left Home Layout: One level([place]ives in a [place] type subdivision")     Bathroom Shower/Tub: Teacher, early years/pre: Standard Bathroom Accessibility: Yes   Home Equipment: Environmental consultant - 2 wheels;Walker - 4 wheels;Shower seat;Bedside commode   Additional Comments: She is oxygen dependent and on 3 LPM, [place]      Prior Functioning/Environment Level of Independence: Independent with assistive device(s)        Comments: She has a RW, and a rollator. Lives alone, but daughter assists with driving for groceries and appts,etc. Patient has had [place] in the past.                 OT Goals(Current goals can be found in the care plan section) Acute Rehab OT Goals Patient Stated Goal: Just want to go home- my daughter will pick me up. OT Goal Formulation: With patient  OT Frequency:      AM-PAC OT "6 Clicks" Daily Activity     Outcome Measure Help from another person eating meals?: None Help from another person taking care of personal grooming?: None Help from another person toileting, which includes using toliet, bedpan, or urinal?: A Little Help from another person bathing (including washing, rinsing, drying)?: A Little Help from another person to put on and taking off regular upper body clothing?: None Help from another person to put on and taking off regular lower body clothing?: A Little 6 Click Score: 21   End of Session Equipment Utilized During Treatment: Rolling walker Nurse Communication: Mobility status  Activity Tolerance: Patient tolerated treatment well Patient left: in chair  OT Visit Diagnosis: Unsteadiness on feet (R26.81);Muscle weakness (generalized) (M62.81)                Time: 0354-6568 OT Time Calculation (min): 20 min Charges:   OT General Charges $OT Visit: 1 Visit OT Evaluation $OT Eval Moderate Complexity: 1 Mod  Kari Baars, [place] [phone] Office- [phone]     Sukari Grist, Edwena Felty D 12/02/2019, 2:42 PM

## 2019-12-02 NOTE — Progress Notes (Signed)
Subjective: No overt bleeding for the past 3 days. No abdominal pain.  Objective: Vital signs in last 24 hours: Temp:  [98.2 F (36.8 C)-98.5 F (36.9 C)] 98.2 F (36.8 C) (03/22 0543) Pulse Rate:  [84-88] 88 (03/22 0543) Resp:  [16-20] 16 (03/22 0543) BP: (116-144)/(57-80) 134/75 (03/22 0543) SpO2:  [100 %] 100 % (03/22 0543) Weight change:  Last BM Date: 12/01/19  PE: GEN:  Obese, chronically ill-appearing, NAD ABD:  Soft, protuberant, non-tender  Lab Results: CBC    Component Value Date/Time   WBC 8.7 12/02/2019 0431   RBC 2.60 (L) 12/02/2019 0431   HGB 7.8 (L) 12/02/2019 0431   HGB 12.6 01/17/2018 1616   HCT 25.2 (L) 12/02/2019 0431   HCT 32.3 (L) 03/10/2019 0545   PLT 142 (L) 12/02/2019 0431   PLT 235 01/17/2018 1616   MCV 96.9 12/02/2019 0431   MCV 96 01/17/2018 1616   MCH 30.0 12/02/2019 0431   MCHC 31.0 12/02/2019 0431   RDW 14.7 12/02/2019 0431   RDW 14.0 01/17/2018 1616   LYMPHSABS 2.3 12/01/2019 0631   MONOABS 0.5 12/01/2019 0631   EOSABS 0.5 12/01/2019 0631   BASOSABS 0.0 12/01/2019 0631   CMP     Component Value Date/Time   NA 139 12/01/2019 0419   NA 140 01/31/2018 1514   K 4.5 12/01/2019 0419   CL 113 (H) 12/01/2019 0419   CO2 23 12/01/2019 0419   GLUCOSE 97 12/01/2019 0419   BUN 11 12/01/2019 0419   BUN 15 01/31/2018 1514   CREATININE 1.05 (H) 12/01/2019 0419   CREATININE 1.08 (H) 08/01/2016 1103   CALCIUM 7.4 (L) 12/01/2019 0419   PROT 4.0 (L) 11/27/2019 1030   ALBUMIN 1.8 (L) 11/27/2019 1030   AST 14 (L) 11/27/2019 1030   ALT 10 11/27/2019 1030   ALKPHOS 69 11/27/2019 1030   BILITOT 0.5 11/27/2019 1030   GFRNONAA 55 (L) 12/01/2019 0419   GFRAA >60 12/01/2019 0419   Assessment:  1.  Recurrent overt GI bleeding, extensive prior investigations, endoscopies, colonoscopies.  Suspected from AVMs. 2.  Acute blood loss anemia. 3.  Multiple medical problems.  Plan:  1.  Follow CBCs. 2.  Advance diet. 3.  Mobilize, OOBTC, PT  evaluation. 4.  No further GI work-up or testing needed at the present time. 5.  [facility] will sign-off; please call with questions; patient can follow up with [place] in the next several weeks; thank you for the consultations.   Landry Dyke 12/02/2019, 11:38 AM   Cell [phone] If no answer or after 5 PM call [phone]

## 2019-12-02 NOTE — Progress Notes (Signed)
Instructions were reviewed with patient. All questions were answered. Patient was transported to main entrance by wheelchair. ° °

## 2019-12-02 NOTE — TOC Initial Note (Signed)
Transition of Care [facility]) - Initial/Assessment Note    Patient Details  Name: Sue [name] MRN: [id number] Date of Birth: 11-15-1950  Transition of Care [facility]) CM/SW Contact:    Lia Hopping, [place] Phone Number: 12/02/2019, 12:15 PM  Clinical Narrative:     Sue [name] is a 69 y.o. female with a prolonged hospitalization secondary to continued GI bleeding of unknown source. Patient has a history of DM2, HTN, COPD on 3 L nasal cannula, morbid obesity. Patient presented secondary to bright red blood per rectum and severe anemia. She has had multiple endoscopy procedures to attempt to localize the source of bleeding without success to date. She continues to require blood transfusions regularly for recurrent/ongoing GI bleeding.              CSW discussed home health PT/OT options with the patient and daughter by phone. CSW offered choice. Patient requested to use [facility], unfortunately the [facility] agency is not accepting [facility] Medicare insured patients at this time. Patient chose [facility]. CSW reached to Representative to confirm staff availability. Information written on AVS follow up section.    Daughter plans to transport the patient and will bring  O2 travel tank.   No other needs identified. CSW signing off.     Expected Discharge Plan: [place] Barriers to Discharge: No Barriers Identified   Patient Goals and CMS Choice Patient states their goals for this hospitalization and ongoing recovery are:: "get stronger with therapy" CMS Medicare.gov Compare Post Acute Care list provided to:: Patient Choice offered to / list presented to : Patient  Expected Discharge Plan and Services Expected Discharge Plan: [place] In-house Referral: Clinical Social Work   Post Acute Care Choice: [place] arrangements for the past 2 months: Apartment                           HH Arranged: PT, OT   Date [place]:  12/02/19 Time [place]: 1209 Representative spoke with at [place]: [place]  Prior Living Arrangements/Services Living arrangements for the past 2 months: Apartment Lives with:: Self Patient language and need for interpreter reviewed:: No Do you feel safe going back to the place where you live?: Yes      Need for Family Participation in Patient Care: Yes (Comment) Care giver support system in place?: Yes (comment) Current home services: DME Criminal Activity/Legal Involvement Pertinent to Current Situation/Hospitalization: No - Comment as needed  Activities of Daily Living Home Assistive Devices/Equipment: Walker (specify type)(rolling) ADL Screening (condition at time of admission) Patient's cognitive ability adequate to safely complete daily activities?: Yes Is the patient deaf or have difficulty hearing?: No Does the patient have difficulty seeing, even when wearing glasses/contacts?: No Does the patient have difficulty concentrating, remembering, or making decisions?: No Patient able to express need for assistance with ADLs?: Yes Does the patient have difficulty dressing or bathing?: Yes Independently performs ADLs?: No Communication: Independent Dressing (OT): Needs assistance Is this a change from baseline?: Change from baseline, expected to last <3days Grooming: Needs assistance Is this a change from baseline?: Pre-admission baseline Feeding: Independent Bathing: Needs assistance Is this a change from baseline?: Change from baseline, expected to last <3 days Toileting: Needs assistance Is this a change from baseline?: Change from baseline, expected to last <3 days In/Out Bed: Needs assistance Is this a change from baseline?: Change from baseline,  expected to last <3 days Walks in Home: Needs assistance Is this a change from baseline?: Change from baseline, expected to last <3 days Does the patient have difficulty walking or climbing stairs?:  Yes Weakness of Legs: Both Weakness of Arms/Hands: Both  Permission Sought/Granted Permission sought to share information with : Family Supports, Case Manager Permission granted to share information with : Yes, Verbal Permission Granted  Share Information with NAME: Burns,Tanisha  Permission granted to share info w AGENCY: [place] granted to share info w Relationship: Daughter  Permission granted to share info w Contact Information: Daughter [phone]  Emotional Assessment Appearance:: Appears stated age Attitude/Demeanor/Rapport: Engaged Affect (typically observed): Accepting, Pleasant Orientation: : Oriented to Self, Oriented to Place, Oriented to  Time, Oriented to Situation Alcohol / Substance Use: Not Applicable Psych Involvement: No (comment)  Admission diagnosis:  Acute blood loss anemia [D62] Rectal bleeding [K62.5] Symptomatic anemia [D64.9] Patient Active Problem List   Diagnosis Date Noted  . CKD (chronic kidney disease) stage 3, GFR 30-59 ml/min 11/13/2019  . Acute blood loss anemia 11/13/2019  . Lower GI bleed 08/16/2019  . Symptomatic anemia 08/03/2019  . QT prolongation 08/03/2019  . GI bleed 08/02/2019  . Diabetes mellitus type II, uncontrolled ([place]) 05/29/2019  . Hyperlipidemia associated with type 2 diabetes mellitus ([place]) 05/29/2019  . DVT (deep venous thrombosis) ([place]) 03/17/2019  . Acute on chronic renal insufficiency 03/17/2019  . Macrocytic anemia 03/17/2019  . Small bowel obstruction ([place]) 03/08/2019  . Fracture of left tibia 02/25/2019  . Left tibial fracture 02/25/2019  . Hyperglycemia 08/16/2018  . Morbid obesity due to excess calories ([place]) 05/09/2018  . Acute bronchitis with COPD ([place]) 10/19/2017  . Atypical chest pain 05/01/2017  . Pulmonary emphysema ([place]) 04/06/2017  . Chronic seasonal allergic rhinitis 04/06/2017  . GERD (gastroesophageal reflux disease) 04/06/2017  . Snoring 04/06/2017  . Myalgia 02/01/2017  .  Arthralgia 02/01/2017  . Chronic pain of toe of left foot 11/14/2016  . Chronic hepatitis C without hepatic coma ([place]) 08/01/2016  . Hypothyroidism 10/29/2015  . Mild diastolic dysfunction 81/85/6314  . Edema 01/01/2015  . Edema of both ankles 12/24/2014  . TIA (transient ischemic attack) 07/30/2014  . Weakness 07/29/2014  . Flank pain 11/12/2013  . Chronic respiratory failure ([place]) 10/25/2013  . DOE (dyspnea on exertion) 10/25/2013  . Depression with anxiety 10/15/2013  . Insomnia 10/15/2013  . HTN (hypertension) 10/09/2013  . Hypokalemia 10/09/2013   PCP:  Ann Held, DO Pharmacy:   [facility], [place] [address] Phone: [phone] Fax: [phone]     Social Determinants of Health (SDOH) Interventions    Readmission Risk Interventions Readmission Risk Prevention Plan 08/04/2019  Transportation Screening Complete  Medication Review ([place]) Complete  HRI or [place] Complete  SW Recovery Care/Counseling Consult Complete  [place] Not Applicable  Some recent data might be hidden

## 2019-12-02 NOTE — Discharge Summary (Signed)
Physician Discharge Summary  Sue [name] ESP:[id number] DOB: 08-19-1951 DOA: 11/13/2019  PCP: Ann Held, DO  Admit date: 11/13/2019 Discharge date: 12/02/2019  Admitted From: Home Disposition: Home  Recommendations for Outpatient Follow-up:  1. Follow up with PCP in 1 week 2. Follow-up with GI 3. Please obtain BMP/CBC in one week 4. Please follow up on the following pending results: None  Home Health: PT, OT Equipment/Devices: None  Discharge Condition: Stable CODE STATUS: Full code Diet recommendation: Heart healthy   Brief/Interim Summary:  Admission HPI written by Etta Quill, DO   Chief Complaint: BRBPR  HPI: Sue [name] is a 69 y.o. female with medical history significant of DM2, HTN, COPD on 3L home O2 at baseline, morbid obesity.  Patient had DVT last summer following leg trauma, was on coumadin for several months until she developed LGIB in Nov and December, coumadin stopped.  EGD in Nov, colonoscopy in Dec: insufficent prep, blood throughout colon, some small scattered diverticula in sigmoid, recd repeating colonoscoy as outpt with better prep and if she re-bled to get CTA abd/pelvis to try and localize bleed.  Today presents to ED with severe BRBPR.  Initially there was minimal blood when she had a bowel movement and wiped, but she states bleeding has worsened. She is now having some rectal bleeding anytime she coughs or increases her abdominal pressure. Today patient states she started to feel dizzy and lightheaded. This is worse when she goes from sitting to standing. She states she feels like she is going to pass out when she does that.  She is not on blood thinners currently.  Additionally, patient states she is feeling short of breath. She is mostly short of breath with exertion. She has not felt shortness of breath with her previous rectal bleeding episodes. She denies chest pain, fever, cough. She states this does not feel like  a COPD exacerbation and does not feel like her CHF. She denies significant leg swelling. She wears 3L O2 at baseline, has not needed to increase recently.  Sats drop to 80% with ambulation. Per EMS.   Hospital course:  GI bleed Unknown source. [facility] on board. Patient has had multiple endoscopies including colonoscopy, push enteroscopy, capsule endoscopy without revelation of source. She has also had a bleeding scan which was unrevealing for source. To date, she has received 17 units of PRBC to date. Hemoglobin stable and GI bleeding resolved prior to discharge. Endoscopy results below. PT/OT recommending HH PT and OT.  Acute blood loss anemia Secondary to above.  Chest pain Atypical. EKG obtained overnight sinus rhythm with some mild lateral lead t-wave flattening which appears unchanged from earlier this month. Chest discomfort resolved with adjustment of nasal canula (previous device was not delivering oxygen correctly). Chest x-ray obtained and is unremarkable for acute process. Hepatic function panel was negative.  Chronic respiratory failure with hypoxia Patient is on 3L via nasal canula.  Hypotension Secondary to acute blood loss. Improved with IV fluids.  CKD stage IIIb Baseline creatinine of 1.5 per chart review.  Essential hypertension Continue metoprolol XL 50 mg daily  Diabetes mellitus, type 2 Managed on Lantus and SSI while inpatient. Resume home regimen on discharge.  Chronic diastolic heart failure Euvolemic. Chest x-ray clear.  Depression Anxiety Continue Buspar, Seroquel  Hypothyroidism Continue Synthroid  Morbid obesity Body mass index is 47.84 kg/m.   Discharge Diagnoses:  Principal Problem:   Lower GI bleed Active Problems:   HTN (hypertension)  Chronic respiratory failure (HCC)   Morbid obesity due to excess calories (HCC)   Diabetes mellitus type II, uncontrolled (HCC)   CKD (chronic kidney disease) stage 3, GFR 30-59  ml/min   Acute blood loss anemia    Discharge Instructions   Allergies as of 12/02/2019      Reactions   Hydrocodone Other (See Comments)   Constipation, hallucinations   Norvasc [amlodipine Besylate]    Marked swelling      Medication List    STOP taking these medications   potassium chloride SA 20 MEQ tablet Commonly known as: KLOR-CON     TAKE these medications   Accu-Chek FastClix Lancets Misc USE TO CHECK BLOOD SUGAR UP TO FOUR TIMES DAILY AS DIRECTED   Accu-Chek Guide test strip Generic drug: glucose blood USE TO CHECK BLOOD SUGAR UP TO FOUR TIMES DAILY   albuterol 108 (90 Base) MCG/ACT inhaler Commonly known as: VENTOLIN HFA Inhale 1-2 puffs into the lungs every 6 (six) hours as needed for wheezing or shortness of breath.   albuterol (2.5 MG/3ML) 0.083% nebulizer solution Commonly known as: PROVENTIL Take 3 mLs (2.5 mg total) by nebulization every 6 (six) hours as needed for wheezing or shortness of breath.   blood glucose meter kit and supplies Kit Dispense based on patient and insurance preference. Use up to four times daily as directed. (FOR ICD-9 250.00, 250.01).   busPIRone 15 MG tablet Commonly known as: BUSPAR Take 15 mg by mouth daily.   famotidine 20 MG tablet Commonly known as: PEPCID Take 20 mg by mouth daily as needed for heartburn or indigestion.   fluticasone 50 MCG/ACT nasal spray Commonly known as: FLONASE Place 2 sprays into both nostrils daily as needed for allergies.   glipiZIDE 5 MG tablet Commonly known as: GLUCOTROL Take 1 tablet (5 mg total) by mouth 2 (two) times daily before a meal.   Insulin Syringe-Needle U-100 28G X 5/16" 0.5 ML Misc Use with lantus once a day   Lantus 100 UNIT/ML injection Generic drug: insulin glargine INJECT 30 UNITS SUBCUTANEOUSLY AT BEDTIME What changed: See the new instructions.   levothyroxine 50 MCG tablet Commonly known as: SYNTHROID Take 1 tablet by mouth once daily   lisinopril 20 MG  tablet Commonly known as: ZESTRIL Take 1 tablet by mouth once daily   LORazepam 1 MG tablet Commonly known as: ATIVAN Take 0.5-1.5 mg by mouth See admin instructions. Take 0.71m by mouth in the morning and 1.573mby mouth at bedtime as needed for anxiety.   metFORMIN 500 MG tablet Commonly known as: Glucophage Take 2 tablets (1,000 mg total) by mouth 2 (two) times daily with a meal.   metoprolol succinate 50 MG 24 hr tablet Commonly known as: TOPROL-XL Take with or immediately following a meal. What changed:   how much to take  how to take this  when to take this   NONFORMULARY OR COMPOUNDED ITEM Pt/ inr   Dx dvt   Tomorrow 04/05/2019  Please call office [phone] results   NONFORMULARY OR COMPOUNDED ITEM PT/ inr    Dx dvt   NONFORMULARY OR COMPOUNDED ITEM Compression stockings  20-30 mm/hg  #1   Dx low ext edema   OXYGEN Inhale 3 L into the lungs continuous.   QUEtiapine 100 MG tablet Commonly known as: SEROQUEL Take 100 mg by mouth at bedtime.   torsemide 20 MG tablet Commonly known as: DEMADEX Take 2 tablets by mouth once daily   zolpidem 10 MG tablet  Commonly known as: AMBIEN Take 10 mg by mouth at bedtime.      Follow-up Information    [facility] Follow up.   Specialty: [place] Why: The agency will call you to arrange the first visit.  Contact information: [address] [place] [address] [phone]        Ann Held, DO. Schedule an appointment as soon as possible for a visit in 1 week(s).   Specialty: Family Medicine Why: Hospital follow-up Contact information: [address] [place] [address] [phone]        Wilford Corner, MD. Schedule an appointment as soon as possible for a visit in 2 week(s).   Specialty: Gastroenterology Contact information: [address] [place] [address] [phone]          Allergies  Allergen Reactions  .  Hydrocodone Other (See Comments)    Constipation, hallucinations  . Norvasc [Amlodipine Besylate]     Marked swelling    Consultations:  [facility]   Procedures/Studies: DG Chest 2 View  Result Date: 11/13/2019 CLINICAL DATA:  Shortness of breath EXAM: CHEST - 2 VIEW COMPARISON:  August 10, 2019 FINDINGS: The heart size and mediastinal contours are within normal limits. Both lungs are clear. The visualized skeletal structures are unremarkable. IMPRESSION: No active cardiopulmonary disease. Electronically Signed   By: Prudencio Pair M.D.   On: 11/13/2019 04:06   DG Abd 1 View  Result Date: 11/20/2019 CLINICAL DATA:  Localize hemoclips EXAM: ABDOMEN - 1 VIEW COMPARISON:  CT 11/13/2019 FINDINGS: Clips are seen in the right lower quadrant, likely in the proximal ascending colon. Separate radiopaque density projects a new Shelly in the left lower quadrant, thin in the central pelvis on repeat imaging. This may reflect ingested capsule. No organomegaly or evidence of bowel obstruction. No free air. IMPRESSION: Hemo clips seen in the right lower quadrant, likely proximal ascending colon. Electronically Signed   By: Rolm Baptise M.D.   On: 11/20/2019 09:26   CT Angio Chest PE W and/or Wo Contrast  Result Date: 11/13/2019 CLINICAL DATA:  Shortness of breath, low hemoglobin, history of GI bleed EXAM: CT ANGIOGRAPHY CHEST WITH CONTRAST TECHNIQUE: Multidetector CT imaging of the chest was performed using the standard protocol during bolus administration of intravenous contrast. Multiplanar CT image reconstructions and MIPs were obtained to evaluate the vascular anatomy. CONTRAST:  28m OMNIPAQUE IOHEXOL 350 MG/ML SOLN COMPARISON:  August 02, 2019 FINDINGS: Cardiovascular: There is a optimal opacification of the pulmonary arteries. There is no central,segmental, or subsegmental filling defects within the pulmonary arteries. The heart is normal in size. No pericardial effusion or thickening. No evidence right  heart strain. There is normal three-vessel brachiocephalic anatomy without proximal stenosis. The thoracic aorta is normal in appearance. Mediastinum/Nodes: No hilar, mediastinal, or axillary adenopathy. Thyroid gland, trachea, and esophagus demonstrate no significant findings. Lungs/Pleura: Minimal bibasilar dependent atelectasis is noted. No pleural effusion or pneumothorax. No airspace consolidation. Upper Abdomen: No acute abnormalities present in the visualized portions of the upper abdomen. Musculoskeletal: No chest wall abnormality. No acute or significant osseous findings. Review of the MIP images confirms the above findings. Abdomen/pelvis: Hepatobiliary: The liver is normal in density without focal abnormality.The main portal vein is patent. Layering calcified gallstones are present. Pancreas: Unremarkable. No pancreatic ductal dilatation or surrounding inflammatory changes. Spleen: Normal in size without focal abnormality. Adrenals/Urinary Tract: Both adrenal glands appear normal. The kidneys and collecting system appear normal without  evidence of urinary tract calculus or hydronephrosis. Bladder is unremarkable. Stomach/Bowel: The stomach, small bowel, and colon are normal in appearance. Scattered colonic diverticula are noted without surrounding inflammatory changes. No inflammatory changes, wall thickening, or obstructive findings.The appendix is normal. Vascular/Lymphatic: There are no enlarged mesenteric, retroperitoneal, or pelvic lymph nodes. Scattered aortic atherosclerotic calcifications are seen without aneurysmal dilatation. Reproductive: The patient is status post hysterectomy. No adnexal masses or collections seen. Other: No evidence of abdominal wall mass or hernia. Musculoskeletal: No acute or significant osseous findings. IMPRESSION: No central, segmental, or subsegmental pulmonary embolism. No acute intrathoracic, abdominal, or pelvic pathology to explain the patient's symptoms.  Diverticulosis without diverticulitis. Cholelithiasis Aortic Atherosclerosis (ICD10-I70.0). Electronically Signed   By: Prudencio Pair M.D.   On: 11/13/2019 06:08   NM GI Blood Loss  Result Date: 11/23/2019 CLINICAL DATA:  69 year old female with GI bleed. EXAM: NUCLEAR MEDICINE GASTROINTESTINAL BLEEDING SCAN TECHNIQUE: Sequential abdominal images were obtained following intravenous administration of Tc-58mlabeled red blood cells. RADIOPHARMACEUTICALS:  21.4 mCi Tc-939mertechnetate in-vitro labeled red cells. COMPARISON:  CT abdomen pelvis dated 11/13/2019. FINDINGS: There is radiopharmaceutical activity in the upper abdomen which appears to correspond to the duodenal C-loop and may be related to underlying peptic ulcer. Clinical correlation is recommended. Delayed images demonstrate further progression of the radiopharmaceutical into the small bowel distal to the ligament of Treitz. Physiologic activity noted in the liver, cardiac blood pool, and vasculature. IMPRESSION: Active GI bleed likely origin ating in the duodenum or proximal small bowel. These results were called by telephone at the time of interpretation on 11/23/2019 at 9:29 pm to nurse Blaydes, who verbally acknowledged these results. Electronically Signed   By: ArAnner Crete.D.   On: 11/23/2019 20:34   NM Bowel Img Meckels  Result Date: 11/26/2019 CLINICAL DATA:  Gastrointestinal bleeding. Concern for Meckel's diverticulum. EXAM: NUCLEAR MEDICINE MECKELS SCAN TECHNIQUE: Sequential abdominal images were obtained following intravenous injection of radiopharmaceutical. RADIOPHARMACEUTICALS:  1027.2illicuries technetium pertechnetate COMPARISON:  Tagged red blood cell bleeding scan 11/23/2019 FINDINGS: No accumulation of radiotracer in the abdomen or pelvis to localize ectopic gastric mucosa. Physiologic activity localizing to the stomach as expected. Physiologic activity noted in the GU tract. IMPRESSION: No evidence of ectopic gastric mucosa  to localize Meckel's diverticulum. Electronically Signed   By: StSuzy Bouchard.D.   On: 11/26/2019 16:07   CT ABDOMEN PELVIS W CONTRAST  Result Date: 11/13/2019 CLINICAL DATA:  Shortness of breath, low hemoglobin, history of GI bleed EXAM: CT ANGIOGRAPHY CHEST WITH CONTRAST TECHNIQUE: Multidetector CT imaging of the chest was performed using the standard protocol during bolus administration of intravenous contrast. Multiplanar CT image reconstructions and MIPs were obtained to evaluate the vascular anatomy. CONTRAST:  8070mMNIPAQUE IOHEXOL 350 MG/ML SOLN COMPARISON:  August 02, 2019 FINDINGS: Cardiovascular: There is a optimal opacification of the pulmonary arteries. There is no central,segmental, or subsegmental filling defects within the pulmonary arteries. The heart is normal in size. No pericardial effusion or thickening. No evidence right heart strain. There is normal three-vessel brachiocephalic anatomy without proximal stenosis. The thoracic aorta is normal in appearance. Mediastinum/Nodes: No hilar, mediastinal, or axillary adenopathy. Thyroid gland, trachea, and esophagus demonstrate no significant findings. Lungs/Pleura: Minimal bibasilar dependent atelectasis is noted. No pleural effusion or pneumothorax. No airspace consolidation. Upper Abdomen: No acute abnormalities present in the visualized portions of the upper abdomen. Musculoskeletal: No chest wall abnormality. No acute or significant osseous findings. Review of the MIP images confirms the above findings. Abdomen/pelvis: Hepatobiliary:  The liver is normal in density without focal abnormality.The main portal vein is patent. Layering calcified gallstones are present. Pancreas: Unremarkable. No pancreatic ductal dilatation or surrounding inflammatory changes. Spleen: Normal in size without focal abnormality. Adrenals/Urinary Tract: Both adrenal glands appear normal. The kidneys and collecting system appear normal without evidence of urinary  tract calculus or hydronephrosis. Bladder is unremarkable. Stomach/Bowel: The stomach, small bowel, and colon are normal in appearance. Scattered colonic diverticula are noted without surrounding inflammatory changes. No inflammatory changes, wall thickening, or obstructive findings.The appendix is normal. Vascular/Lymphatic: There are no enlarged mesenteric, retroperitoneal, or pelvic lymph nodes. Scattered aortic atherosclerotic calcifications are seen without aneurysmal dilatation. Reproductive: The patient is status post hysterectomy. No adnexal masses or collections seen. Other: No evidence of abdominal wall mass or hernia. Musculoskeletal: No acute or significant osseous findings. IMPRESSION: No central, segmental, or subsegmental pulmonary embolism. No acute intrathoracic, abdominal, or pelvic pathology to explain the patient's symptoms. Diverticulosis without diverticulitis. Cholelithiasis Aortic Atherosclerosis (ICD10-I70.0). Electronically Signed   By: Prudencio Pair M.D.   On: 11/13/2019 06:08   DG CHEST PORT 1 VIEW  Result Date: 11/27/2019 CLINICAL DATA:  Dyspnea, chest pain. EXAM: PORTABLE CHEST 1 VIEW COMPARISON:  November 13, 2019. FINDINGS: Stable cardiomediastinal silhouette. No pneumothorax or pleural effusion is noted. Both lungs are clear. The visualized skeletal structures are unremarkable. IMPRESSION: No active disease. Electronically Signed   By: Marijo Conception M.D.   On: 11/27/2019 10:30   DG CHEST PORT 1 VIEW  Result Date: 11/13/2019 CLINICAL DATA:  Dyspnea EXAM: PORTABLE CHEST 1 VIEW COMPARISON:  Earlier same day FINDINGS: Emphysema. No new consolidation or edema. No pleural effusion or pneumothorax. Stable cardiomediastinal contours. IMPRESSION: No new findings. Electronically Signed   By: Macy Mis M.D.   On: 11/13/2019 19:32      COLONOSCOPY (3/9) Impression: - Old blood in the entire examined colon. - Friability with spontaneous  minimal bleeding in  what I believe was the cecum/appendiceal orifice  region. Clips were placed. Tattooed. - No specimens collected. - It is unclear whether the observed oozing  accounts for the patient's continuing blood loss,  and/or whether it is a localized problem or  indicative of a more generalized process.  Recommendation: - To visualize the small bowel, perform video  capsule endoscopy today.   COLONOSCOPY (3/12) Impression: - Preparation of the colon was fair. - No active bleeding seen. - A single erosion with adherent blood was seen in  the proximal ascending colon. Clip was placed. - No specimens collected.  Recommendation: - Repeat colonoscopy tomorrow for retreatment.   COLONOSCOPY (3/13) Impression: - Preparation of the colon was fair. - Blood in the entire examined colon. - The examined portion of the ileum was normal. - The distal rectum and anal verge are normal on  retroflexion view. - No specimens collected.  Recommendation: - Clear liquid diet. - NM GI bleeding scan for obscure overt GI bleeding.   PUSH ENTEROSCOPY (3/14) Findings: Normal esophagus, normal stomach, normal duodenal bulb and duodenum. No evidence of active or recent bleeding unlike seen on GI bleeding scan. 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 A small bowel PillCam was deployed in the duodenal bulb.   Recommendations: N.p.o. for now. Okay to  start clear liquid diet 2 hours later. Await small bowel PillCam results. Monitor H&H and transfuse as needed.   PUSH ENTEROSCOPY (3/17) Impression: - Normal esophagus. - Z-line, 42 cm from the incisors. - Acute gastritis. - Two non-bleeding  angiodysplastic lesions in the  duodenum. Treated with argon plasma coagulation  (APC). - A single non-bleeding angiodysplastic lesion in  the duodenum. Treated with argon plasma coagulation  (APC). - A single non-bleeding angiodysplastic lesion in  the jejunum. Treated with argon plasma coagulation  (APC). - No specimens collected. Recommendation: - Capsule endoscopy tomorrow and repeat colonoscopy  on 11/29/19. If no source found then may need a deep  small bowel enteroscopy at a tertiary care center.   CAPSULE ENDOSCOPY   COLONOSCOPY (3/19) Impression: - Diverticulosis in the ascending colon. - A tattoo was seen in the ascending colon. The  tattoo site appeared normal. - Internal hemorrhoids. - The examined portion of the ileum was normal. - No specimens collected.  Recommendation: - Patient has a contact number available for  emergencies. The signs and symptoms of potential  delayed complications were discussed with the  patient. Return to normal activities tomorrow.  Written discharge instructions  were provided to the  patient. - Repeat colonoscopy in 5 years for screening  purposes.  Subjective: No bleeding overnight.  Discharge Exam: Vitals:   12/02/19 1142 12/02/19 1327  BP:  (!) 128/53  Pulse:  82  Resp:  18  Temp:  98 F (36.7 C)  SpO2: 100%    Vitals:   12/01/19 2131 12/02/19 0543 12/02/19 1142 12/02/19 1327  BP: (!) 144/80 134/75  (!) 128/53  Pulse: 86 88  82  Resp: '16 16  18  ' Temp: 98.3 F (36.8 C) 98.2 F (36.8 C)  98 F (36.7 C)  TempSrc: Oral Oral  Oral  SpO2: 100% 100% 100%   Weight:      Height:        General: Pt is alert, awake, not in acute distress Cardiovascular: RRR, S1/S2 +, no rubs, no gallops Respiratory: CTA bilaterally, no wheezing, no rhonchi Abdominal: Soft, NT, ND, bowel sounds + Extremities: no cyanosis    The results of significant diagnostics from this hospitalization (including imaging, microbiology, ancillary and laboratory) are listed below for reference.     Microbiology: No results found for this or any previous visit (from the past 240 hour(s)).   Labs: BNP (last 3 results) Recent Labs    11/13/19 0345  BNP 16.1   Basic Metabolic Panel: Recent Labs  Lab 11/27/19 0842 11/28/19 0438 11/29/19 0159 11/30/19 0147 12/01/19 0419  NA 140 142 144 141 139  K 4.8 4.0 4.5 4.5 4.5  CL 112* 113* 115* 112* 113*  CO2 '24 25 27 26 23  ' GLUCOSE 171* 100* 92 103* 97  BUN '17 17 15 13 11  ' CREATININE 1.36* 1.27* 1.28* 1.11* 1.05*  CALCIUM 7.2* 7.6* 7.6* 7.4* 7.4*  MG 1.7 1.7 1.9 1.9 1.8   Liver Function Tests: Recent Labs  Lab 11/27/19 1030  AST 14*  ALT 10  ALKPHOS 69  BILITOT 0.5  PROT 4.0*  ALBUMIN 1.8*   No results for input(s): LIPASE, AMYLASE in the last 168 hours. No results for input(s): AMMONIA in the last 168 hours. CBC: Recent Labs  Lab 11/28/19 0438 11/28/19 1312 11/29/19 0159 11/29/19 1256 11/30/19 0147 11/30/19 1315  12/01/19 0631 12/01/19 1309 12/01/19 2105 12/02/19 0431 12/02/19 1259  WBC 12.9*  --  10.0  --  9.4  --  8.5  --   --  8.7  --   NEUTROABS  --   --   --   --   --   --  5.2  --   --   --   --  HGB 8.7*   < > 8.3*   < > 8.4*   < > 7.5* 7.8* 8.5* 7.8* 8.0*  HCT 25.6*   < > 25.6*   < > 26.8*   < > 23.1* 25.3* 27.3* 25.2* 25.5*  MCV 91.1  --  93.8  --  96.8  --  92.8  --   --  96.9  --   PLT 104*  --  109*  --  128*  --  126*  --   --  142*  --    < > = values in this interval not displayed.   Cardiac Enzymes: No results for input(s): CKTOTAL, CKMB, CKMBINDEX, TROPONINI in the last 168 hours. BNP: Invalid input(s): POCBNP CBG: Recent Labs  Lab 12/01/19 1143 12/01/19 1605 12/01/19 2132 12/02/19 0724 12/02/19 1147  GLUCAP 87 123* 90 76 80   D-Dimer No results for input(s): DDIMER in the last 72 hours. Hgb A1c No results for input(s): HGBA1C in the last 72 hours. Lipid Profile No results for input(s): CHOL, HDL, LDLCALC, TRIG, CHOLHDL, LDLDIRECT in the last 72 hours. Thyroid function studies No results for input(s): TSH, T4TOTAL, T3FREE, THYROIDAB in the last 72 hours.  Invalid input(s): FREET3 Anemia work up No results for input(s): VITAMINB12, FOLATE, FERRITIN, TIBC, IRON, RETICCTPCT in the last 72 hours. Urinalysis    Component Value Date/Time   COLORURINE YELLOW 08/15/2018 2201   APPEARANCEUR CLEAR 08/15/2018 2201   LABSPEC <1.005 (L) 08/15/2018 2201   PHURINE 5.5 08/15/2018 2201   GLUCOSEU >=500 (A) 08/15/2018 2201   GLUCOSEU NEGATIVE 11/11/2013 1353   HGBUR NEGATIVE 08/15/2018 2201   BILIRUBINUR NEGATIVE 08/15/2018 2201   BILIRUBINUR neg 10/27/2015 1355   KETONESUR NEGATIVE 08/15/2018 2201   PROTEINUR NEGATIVE 08/15/2018 2201   UROBILINOGEN 0.2 10/27/2015 1355   UROBILINOGEN 0.2 07/29/2014 2300   NITRITE NEGATIVE 08/15/2018 2201   LEUKOCYTESUR NEGATIVE 08/15/2018 2201   Sepsis Labs Invalid input(s): PROCALCITONIN,  WBC,  LACTICIDVEN Microbiology No  results found for this or any previous visit (from the past 240 hour(s)).   Time coordinating discharge: 35 minutes  SIGNED:   Cordelia Poche, MD [facility] 12/02/2019, 2:02 PM

## 2019-12-03 ENCOUNTER — Telehealth: Payer: Self-pay | Admitting: *Deleted

## 2019-12-03 ENCOUNTER — Encounter: Payer: Self-pay | Admitting: *Deleted

## 2019-12-03 NOTE — Telephone Encounter (Signed)
Transition Care Management Follow-up Telephone Call --Call done w/ dtr [name] (okay per pt and chart)   Date discharged? 12/02/19   How have you been since you were released from the hospital? Doing fair.   Do you understand why you were in the hospital? GI bleed   Do you understand the discharge instructions? yes   Where were you discharged to? Home with dtr   Items Reviewed:  Medications reviewed: no, states the only change was for pt to stop taking potassium  Allergies reviewed: no new allergies per dtr  Dietary changes reviewed: yes  Referrals reviewed: yes   Functional Questionnaire:   Activities of Daily Living (ADLs):   She states they are independent in the following: ambulation, bathing and hygiene, feeding, continence, grooming, toileting and dressing States they require assistance with the following: na   Any transportation issues/concerns?: no   Any patient concerns? no   Confirmed importance and date/time of follow-up visits scheduled yes  Provider Appointment booked with PCP 12/06/19 @1120   Confirmed with patient if condition begins to worsen call PCP or go to the ER.  Patient was given the office number and encouraged to call back with question or concerns.  : no

## 2019-12-04 ENCOUNTER — Telehealth: Payer: Self-pay

## 2019-12-04 DIAGNOSIS — I5032 Chronic diastolic (congestive) heart failure: Secondary | ICD-10-CM | POA: Diagnosis not present

## 2019-12-04 DIAGNOSIS — J9621 Acute and chronic respiratory failure with hypoxia: Secondary | ICD-10-CM | POA: Diagnosis not present

## 2019-12-04 DIAGNOSIS — K922 Gastrointestinal hemorrhage, unspecified: Secondary | ICD-10-CM | POA: Diagnosis not present

## 2019-12-04 DIAGNOSIS — D649 Anemia, unspecified: Secondary | ICD-10-CM | POA: Diagnosis not present

## 2019-12-04 DIAGNOSIS — I9589 Other hypotension: Secondary | ICD-10-CM | POA: Diagnosis not present

## 2019-12-04 DIAGNOSIS — K921 Melena: Secondary | ICD-10-CM | POA: Diagnosis not present

## 2019-12-04 DIAGNOSIS — E1122 Type 2 diabetes mellitus with diabetic chronic kidney disease: Secondary | ICD-10-CM | POA: Diagnosis not present

## 2019-12-04 DIAGNOSIS — N1832 Chronic kidney disease, stage 3b: Secondary | ICD-10-CM | POA: Diagnosis not present

## 2019-12-04 DIAGNOSIS — I130 Hypertensive heart and chronic kidney disease with heart failure and stage 1 through stage 4 chronic kidney disease, or unspecified chronic kidney disease: Secondary | ICD-10-CM | POA: Diagnosis not present

## 2019-12-04 DIAGNOSIS — D62 Acute posthemorrhagic anemia: Secondary | ICD-10-CM | POA: Diagnosis not present

## 2019-12-04 NOTE — Telephone Encounter (Signed)
Patient called in to see if Dr. Etter Sjogren could give her a call concerning her paine in her legs. Please call the patient back at [phone]

## 2019-12-04 NOTE — Telephone Encounter (Signed)
Called patient. Left VM to set up appointment

## 2019-12-04 NOTE — Telephone Encounter (Signed)
Sue [name] from [facility] Patient Physical therapist  Calling in to get verbal orders for Once a week for 1 week and Twice a week for 1 week Once a week for 1 week for Physical therapy for Balance and mobility training. Patient came home with PT and OT orders. Please give Sue [name] a call at [phone]

## 2019-12-05 ENCOUNTER — Other Ambulatory Visit: Payer: Self-pay

## 2019-12-05 NOTE — Telephone Encounter (Signed)
Verbal confirmed

## 2019-12-06 ENCOUNTER — Encounter: Payer: Self-pay | Admitting: Family Medicine

## 2019-12-06 ENCOUNTER — Telehealth (INDEPENDENT_AMBULATORY_CARE_PROVIDER_SITE_OTHER): Payer: Medicare HMO | Admitting: Family Medicine

## 2019-12-06 VITALS — BP 140/80 | HR 84 | Temp 98.8°F | Ht 63.0 in

## 2019-12-06 DIAGNOSIS — J9611 Chronic respiratory failure with hypoxia: Secondary | ICD-10-CM

## 2019-12-06 DIAGNOSIS — I130 Hypertensive heart and chronic kidney disease with heart failure and stage 1 through stage 4 chronic kidney disease, or unspecified chronic kidney disease: Secondary | ICD-10-CM | POA: Diagnosis not present

## 2019-12-06 DIAGNOSIS — E1165 Type 2 diabetes mellitus with hyperglycemia: Secondary | ICD-10-CM | POA: Diagnosis not present

## 2019-12-06 DIAGNOSIS — M25471 Effusion, right ankle: Secondary | ICD-10-CM | POA: Diagnosis not present

## 2019-12-06 DIAGNOSIS — N189 Chronic kidney disease, unspecified: Secondary | ICD-10-CM

## 2019-12-06 DIAGNOSIS — D62 Acute posthemorrhagic anemia: Secondary | ICD-10-CM

## 2019-12-06 DIAGNOSIS — M25472 Effusion, left ankle: Secondary | ICD-10-CM

## 2019-12-06 DIAGNOSIS — N289 Disorder of kidney and ureter, unspecified: Secondary | ICD-10-CM

## 2019-12-06 DIAGNOSIS — E785 Hyperlipidemia, unspecified: Secondary | ICD-10-CM

## 2019-12-06 DIAGNOSIS — E1169 Type 2 diabetes mellitus with other specified complication: Secondary | ICD-10-CM

## 2019-12-06 DIAGNOSIS — K921 Melena: Secondary | ICD-10-CM

## 2019-12-06 DIAGNOSIS — N1832 Chronic kidney disease, stage 3b: Secondary | ICD-10-CM | POA: Diagnosis not present

## 2019-12-06 DIAGNOSIS — I5032 Chronic diastolic (congestive) heart failure: Secondary | ICD-10-CM | POA: Diagnosis not present

## 2019-12-06 DIAGNOSIS — I9589 Other hypotension: Secondary | ICD-10-CM | POA: Diagnosis not present

## 2019-12-06 DIAGNOSIS — E039 Hypothyroidism, unspecified: Secondary | ICD-10-CM | POA: Diagnosis not present

## 2019-12-06 DIAGNOSIS — E1122 Type 2 diabetes mellitus with diabetic chronic kidney disease: Secondary | ICD-10-CM | POA: Diagnosis not present

## 2019-12-06 DIAGNOSIS — J9621 Acute and chronic respiratory failure with hypoxia: Secondary | ICD-10-CM | POA: Diagnosis not present

## 2019-12-06 DIAGNOSIS — K922 Gastrointestinal hemorrhage, unspecified: Secondary | ICD-10-CM | POA: Diagnosis not present

## 2019-12-06 NOTE — Progress Notes (Signed)
Virtual Visit via Video Note  I connected with Sue [name] on 12/06/19 at 11:20 AM EDT by a video enabled telemedicine application and verified that I am speaking with the correct person using two identifiers.  Location: Patient: home alone  Provider: home    I discussed the limitations of evaluation and management by telemedicine and the availability of in person appointments. The patient expressed understanding and agreed to proceed.  History of Present Illness: Pt home from hosp ---- in hosp for gi bleed 3/3-3/22  Pt has a f/u with GI -4/30 Gi saw her in hosp ---- colonoscopy was done , egd   Past Medical History:  Diagnosis Date  . Allergic rhinitis   . Depression   . Emphysema of lung ([place])    3L home O2  . GERD (gastroesophageal reflux disease)   . Hypertension   . Hypothyroidism   . Obesity, morbid, BMI 50 or higher ([place])   . Stroke [facility]) 2016   TIA   . Urine incontinence    Current Outpatient Medications on File Prior to Visit  Medication Sig Dispense Refill  . Accu-Chek FastClix Lancets MISC USE TO CHECK BLOOD SUGAR UP TO FOUR TIMES DAILY AS DIRECTED 306 each 0  . ACCU-CHEK GUIDE test strip USE TO CHECK BLOOD SUGAR UP TO FOUR TIMES DAILY 100 each 2  . albuterol (PROVENTIL HFA;VENTOLIN HFA) 108 (90 Base) MCG/ACT inhaler Inhale 1-2 puffs into the lungs every 6 (six) hours as needed for wheezing or shortness of breath.    Marland Kitchen albuterol (PROVENTIL) (2.5 MG/3ML) 0.083% nebulizer solution Take 3 mLs (2.5 mg total) by nebulization every 6 (six) hours as needed for wheezing or shortness of breath. 150 mL 1  . blood glucose meter kit and supplies KIT Dispense based on patient and insurance preference. Use up to four times daily as directed. (FOR ICD-9 250.00, 250.01). 1 each 0  . busPIRone (BUSPAR) 15 MG tablet Take 15 mg by mouth daily.   4  . famotidine (PEPCID) 20 MG tablet Take 20 mg by mouth daily as needed for heartburn or indigestion.    . fluticasone (FLONASE) 50  MCG/ACT nasal spray Place 2 sprays into both nostrils daily as needed for allergies.    Marland Kitchen glipiZIDE (GLUCOTROL) 5 MG tablet Take 1 tablet (5 mg total) by mouth 2 (two) times daily before a meal. 60 tablet 2  . Insulin Syringe-Needle U-100 28G X 5/16" 0.5 ML MISC Use with lantus once a day 100 each 1  . LANTUS 100 UNIT/ML injection INJECT 30 UNITS SUBCUTANEOUSLY AT BEDTIME (Patient taking differently: Inject 30 Units into the skin daily. ) 10 mL 0  . levothyroxine (SYNTHROID) 50 MCG tablet Take 1 tablet by mouth once daily 90 tablet 1  . lisinopril (ZESTRIL) 20 MG tablet Take 1 tablet by mouth once daily (Patient taking differently: Take 20 mg by mouth daily. ) 90 tablet 0  . LORazepam (ATIVAN) 1 MG tablet Take 0.5-1.5 mg by mouth See admin instructions. Take 0.'5mg'$  by mouth in the morning and 1.'5mg'$  by mouth at bedtime as needed for anxiety.    . metFORMIN (GLUCOPHAGE) 500 MG tablet Take 2 tablets (1,000 mg total) by mouth 2 (two) times daily with a meal. 120 tablet 2  . metoprolol succinate (TOPROL-XL) 50 MG 24 hr tablet Take with or immediately following a meal. (Patient taking differently: Take 50 mg by mouth daily. Take with or immediately following a meal.) 180 tablet 0  . NONFORMULARY OR COMPOUNDED ITEM  Pt/ inr   Dx dvt   Tomorrow 04/05/2019  Please call office [phone] with results 1 each 0  . NONFORMULARY OR COMPOUNDED ITEM PT/ inr    Dx dvt 1 each 0  . NONFORMULARY OR COMPOUNDED ITEM Compression stockings  20-30 mm/hg  #1   Dx low ext edema 1 each 0  . OXYGEN Inhale 3 L into the lungs continuous.     Marland Kitchen QUEtiapine (SEROQUEL) 100 MG tablet Take 100 mg by mouth at bedtime.    . torsemide (DEMADEX) 20 MG tablet Take 2 tablets by mouth once daily 180 tablet 0  . zolpidem (AMBIEN) 10 MG tablet Take 10 mg by mouth at bedtime.     No current facility-administered medications on file prior to visit.   Allergies  Allergen Reactions  . Hydrocodone Other (See Comments)    Constipation,  hallucinations  . Norvasc [Amlodipine Besylate]     Marked swelling   Social History   Socioeconomic History  . Marital status: Divorced    Spouse name: Not on file  . Number of children: Not on file  . Years of education: Not on file  . Highest education level: Not on file  Occupational History  . Occupation: disabled- [employer]  Tobacco Use  . Smoking status: Former Smoker    Packs/day: 1.00    Years: 40.00    Pack years: 40.00    Types: Cigarettes    Start date: 07/21/1972    Quit date: 10/16/2011    Years since quitting: 8.1  . Smokeless tobacco: Never Used  Substance and Sexual Activity  . Alcohol use: Not Currently    Comment: Occ-- Wine  . Drug use: No  . Sexual activity: Not on file  Other Topics Concern  . Not on file  Social History Narrative   [facility] (04/06/17):   Originally from [place]. Has also lived in [place], [place]. She also previously lived in [place] for 20 years. No history of [place]. Moved to [place] in 1989. 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 No pets currently. No bird exposure. Questionable previous mold exposure in her daughter's home. 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    Social Determinants of Health   Financial Resource Strain:   . Difficulty of Paying Living Expenses:   Food Insecurity:   . Worried About Charity fundraiser in the Last Year:   . Arboriculturist in the Last Year:   Transportation Needs:   . Film/video editor (Medical):   Marland Kitchen Lack of Transportation (Non-Medical):   Physical Activity:   . Days of Exercise per Week:   . Minutes of Exercise per Session:   Stress:   . Feeling of Stress :   Social Connections:   . Frequency of Communication with Friends and Family:   . Frequency of Social Gatherings  with Friends and Family:   . Attends Religious Services:   . Active Member of Clubs or Organizations:   . Attends Archivist Meetings:   Marland Kitchen Marital Status:   Intimate Partner Violence:   . Fear of Current or Ex-Partner:   . Emotionally Abused:   Marland Kitchen Physically Abused:   . Sexually Abused:      Observations/Objective: Vitals:   12/06/19  1113  BP: 140/80  Pulse: 84  Temp: 98.8 F (37.1 C)  SpO2: 90%  pt isn Nad On O2  Pt is not sob No abd pain  No evidence of bleeding   Assessment and Plan: 1. Gastrointestinal hemorrhage with melena F/u with GI Check labs --- pt will come in Monday for labs   - Basic metabolic panel; Future - CBC with Differential/Platelet; Future   Follow Up Instructions:    I discussed the assessment and treatment plan with the patient. The patient was provided an opportunity to ask questions and all were answered. The patient agreed with the plan and demonstrated an understanding of the instructions.   The patient was advised to call back or seek an in-person evaluation if the symptoms worsen or if the condition fails to improve as anticipated.  I provided 30 minutes of non-face-to-face time during this encounter.   Ann Held, DO

## 2019-12-06 NOTE — Assessment & Plan Note (Signed)
Tolerating statin, encouraged heart healthy diet, avoid trans fats, minimize simple carbs and saturated fats. Increase exercise as tolerated 

## 2019-12-06 NOTE — Assessment & Plan Note (Signed)
Check labs  Lab Results  Component Value Date   HGB 8.0 (L) 12/02/2019

## 2019-12-06 NOTE — Assessment & Plan Note (Signed)
con't diuretic  

## 2019-12-06 NOTE — Assessment & Plan Note (Signed)
Check labs    Chemistry      Component Value Date/Time   NA 139 12/01/2019 0419   NA 140 01/31/2018 1514   K 4.5 12/01/2019 0419   CL 113 (H) 12/01/2019 0419   CO2 23 12/01/2019 0419   BUN 11 12/01/2019 0419   BUN 15 01/31/2018 1514   CREATININE 1.05 (H) 12/01/2019 0419   CREATININE 1.08 (H) 08/01/2016 1103      Component Value Date/Time   CALCIUM 7.4 (L) 12/01/2019 0419   ALKPHOS 69 11/27/2019 1030   AST 14 (L) 11/27/2019 1030   ALT 10 11/27/2019 1030   BILITOT 0.5 11/27/2019 1030

## 2019-12-06 NOTE — Assessment & Plan Note (Signed)
Stable Lab Results  Component Value Date   TSH 7.383 (H) 02/25/2019

## 2019-12-06 NOTE — Assessment & Plan Note (Signed)
On O2 

## 2019-12-06 NOTE — Assessment & Plan Note (Signed)
Lab Results  Component Value Date   HGBA1C 7.9 (H) 11/13/2019   Per endo

## 2019-12-09 ENCOUNTER — Telehealth: Payer: Self-pay | Admitting: Family Medicine

## 2019-12-09 NOTE — Telephone Encounter (Signed)
Patient would like to know whether or not Dr Carollee Herter will send a medication in for pain.

## 2019-12-10 ENCOUNTER — Telehealth: Payer: Self-pay | Admitting: Family Medicine

## 2019-12-10 ENCOUNTER — Telehealth: Payer: Self-pay

## 2019-12-10 DIAGNOSIS — N1832 Chronic kidney disease, stage 3b: Secondary | ICD-10-CM | POA: Diagnosis not present

## 2019-12-10 DIAGNOSIS — J9621 Acute and chronic respiratory failure with hypoxia: Secondary | ICD-10-CM | POA: Diagnosis not present

## 2019-12-10 DIAGNOSIS — E1122 Type 2 diabetes mellitus with diabetic chronic kidney disease: Secondary | ICD-10-CM | POA: Diagnosis not present

## 2019-12-10 DIAGNOSIS — I5032 Chronic diastolic (congestive) heart failure: Secondary | ICD-10-CM | POA: Diagnosis not present

## 2019-12-10 DIAGNOSIS — K922 Gastrointestinal hemorrhage, unspecified: Secondary | ICD-10-CM | POA: Diagnosis not present

## 2019-12-10 DIAGNOSIS — I130 Hypertensive heart and chronic kidney disease with heart failure and stage 1 through stage 4 chronic kidney disease, or unspecified chronic kidney disease: Secondary | ICD-10-CM | POA: Diagnosis not present

## 2019-12-10 DIAGNOSIS — I9589 Other hypotension: Secondary | ICD-10-CM | POA: Diagnosis not present

## 2019-12-10 DIAGNOSIS — D62 Acute posthemorrhagic anemia: Secondary | ICD-10-CM | POA: Diagnosis not present

## 2019-12-10 DIAGNOSIS — K921 Melena: Secondary | ICD-10-CM | POA: Diagnosis not present

## 2019-12-10 NOTE — Telephone Encounter (Signed)
We discussed swelling but nothing about blisters and pain We would need to see her ----- or she may want to see [facility] CHF,  shingles ?

## 2019-12-10 NOTE — Telephone Encounter (Signed)
Pt states having a full body pain. Pt states it feels like someone is stabbing her in the legs. Pt states pain is shooting up from feet to her neck. Pt states pain is mostly on the left side. Pt states having swelling in legs and blistering. Pt states blisters having a clear discharge. Pt states legs are not red and do not feel hot.

## 2019-12-10 NOTE — Telephone Encounter (Signed)
Can they use ace wraps or velcro wraps ?

## 2019-12-10 NOTE — Telephone Encounter (Signed)
Physical Theraptist calling about patients Compression Hose.... But they are unclear of what the compression is.. Pt is unable to wear due to pain.... Measurments are ankle  30 cm calf  47 height  44cm ... Local pharmacy states patients measurements don't meet local   Either too big or too small.  Please advise what other options there may be for the patient

## 2019-12-10 NOTE — Telephone Encounter (Signed)
Spoke with patient. Offered pt a in person appointment with Dr. Larose Kells tomorrow. Pt stated she wanted to see Dr. Etter Sjogren. Advised that Dr. Etter Sjogren was only virtual tomorrow. Pt states she can't come in. Pt states no chest pain, SOB or headache. Advised Pt if sxs get worse to go to the ED.

## 2019-12-10 NOTE — Telephone Encounter (Signed)
Please advise 

## 2019-12-10 NOTE — Telephone Encounter (Signed)
Patients Physical therapist Herbert Deaner from [facility] called  In to give a report. That the patient went from having no pain to pain level 8 out of  10. Patient has no medication for paine on her list. Yesterday she took 4 IBprohen even though she was not supposed to with mild improvement. Blood pressure was 140/90  Please call Clair Gulling at [phone] as soon as possible to discuss pain medication options.

## 2019-12-11 ENCOUNTER — Telehealth: Payer: Self-pay | Admitting: Family Medicine

## 2019-12-11 ENCOUNTER — Other Ambulatory Visit: Payer: Self-pay

## 2019-12-11 ENCOUNTER — Ambulatory Visit (INDEPENDENT_AMBULATORY_CARE_PROVIDER_SITE_OTHER): Payer: Medicare HMO | Admitting: Family Medicine

## 2019-12-11 ENCOUNTER — Encounter: Payer: Self-pay | Admitting: Family Medicine

## 2019-12-11 ENCOUNTER — Telehealth: Payer: Self-pay

## 2019-12-11 VITALS — BP 116/59 | HR 70 | Ht 63.0 in | Wt 298.0 lb

## 2019-12-11 DIAGNOSIS — K921 Melena: Secondary | ICD-10-CM | POA: Diagnosis not present

## 2019-12-11 DIAGNOSIS — I5032 Chronic diastolic (congestive) heart failure: Secondary | ICD-10-CM | POA: Diagnosis not present

## 2019-12-11 DIAGNOSIS — E876 Hypokalemia: Secondary | ICD-10-CM

## 2019-12-11 DIAGNOSIS — K922 Gastrointestinal hemorrhage, unspecified: Secondary | ICD-10-CM | POA: Diagnosis not present

## 2019-12-11 DIAGNOSIS — D62 Acute posthemorrhagic anemia: Secondary | ICD-10-CM | POA: Diagnosis not present

## 2019-12-11 DIAGNOSIS — I130 Hypertensive heart and chronic kidney disease with heart failure and stage 1 through stage 4 chronic kidney disease, or unspecified chronic kidney disease: Secondary | ICD-10-CM | POA: Diagnosis not present

## 2019-12-11 DIAGNOSIS — J9621 Acute and chronic respiratory failure with hypoxia: Secondary | ICD-10-CM | POA: Diagnosis not present

## 2019-12-11 DIAGNOSIS — E1122 Type 2 diabetes mellitus with diabetic chronic kidney disease: Secondary | ICD-10-CM | POA: Diagnosis not present

## 2019-12-11 DIAGNOSIS — I9589 Other hypotension: Secondary | ICD-10-CM | POA: Diagnosis not present

## 2019-12-11 DIAGNOSIS — M791 Myalgia, unspecified site: Secondary | ICD-10-CM | POA: Diagnosis not present

## 2019-12-11 DIAGNOSIS — R21 Rash and other nonspecific skin eruption: Secondary | ICD-10-CM | POA: Diagnosis not present

## 2019-12-11 DIAGNOSIS — N1832 Chronic kidney disease, stage 3b: Secondary | ICD-10-CM | POA: Diagnosis not present

## 2019-12-11 MED ORDER — POTASSIUM CHLORIDE CRYS ER 20 MEQ PO TBCR: 20.0000 meq | EXTENDED_RELEASE_TABLET | Freq: Every day | ORAL | 2 refills | Status: DC

## 2019-12-11 MED ORDER — TRAMADOL HCL 50 MG PO TABS: 50.0000 mg | ORAL_TABLET | Freq: Three times a day (TID) | ORAL | 0 refills | Status: AC | PRN

## 2019-12-11 MED ORDER — FLUOCINONIDE-E 0.05 % EX CREA: 1.0000 "application " | TOPICAL_CREAM | Freq: Two times a day (BID) | CUTANEOUS | 2 refills | Status: DC

## 2019-12-11 NOTE — Telephone Encounter (Signed)
Left VM with Herbert Deaner

## 2019-12-11 NOTE — Telephone Encounter (Signed)
194/110 sitting at rest

## 2019-12-11 NOTE — Telephone Encounter (Signed)
Left VM for recommendation about wraps

## 2019-12-11 NOTE — Telephone Encounter (Signed)
Milta Deiters from [facility] called while with patient. He reports BP was 194/110. Pt reports no other sxs. Milta Deiters states that patient had taken her BP meds about 1 hour ago. Advised patient she needs to be evaluated in person at the ED downstairs or be taken to [facility]. Pt states she will call her daughter to take her. I advised if daughter can not take her then patient needs to be taken by ambulance.

## 2019-12-11 NOTE — Progress Notes (Signed)
Virtual Visit via Video Note  I connected with Sue [name] on 12/11/19 at 11:40 AM EDT by a video enabled telemedicine application and verified that I am speaking with the correct person using two identifiers.  Location: Patient: home with daughter  Provider: home    I discussed the limitations of evaluation and management by telemedicine and the availability of in person appointments. The patient expressed understanding and agreed to proceed.  History of Present Illness: Pt is home with her daughter and c/o leg swelling and blisters and myalgias since after her last visit with [place].  No increasing sob or cp.      Past Medical History:  Diagnosis Date  . Allergic rhinitis   . Depression   . Emphysema of lung ([place])    3L home O2  . GERD (gastroesophageal reflux disease)   . Hypertension   . Hypothyroidism   . Obesity, morbid, BMI 50 or higher ([place])   . Stroke [facility]) 2016   TIA   . Urine incontinence    Current Outpatient Medications on File Prior to Visit  Medication Sig Dispense Refill  . Accu-Chek FastClix Lancets MISC USE TO CHECK BLOOD SUGAR UP TO FOUR TIMES DAILY AS DIRECTED 306 each 0  . ACCU-CHEK GUIDE test strip USE TO CHECK BLOOD SUGAR UP TO FOUR TIMES DAILY 100 each 2  . albuterol (PROVENTIL HFA;VENTOLIN HFA) 108 (90 Base) MCG/ACT inhaler Inhale 1-2 puffs into the lungs every 6 (six) hours as needed for wheezing or shortness of breath.    Marland Kitchen albuterol (PROVENTIL) (2.5 MG/3ML) 0.083% nebulizer solution Take 3 mLs (2.5 mg total) by nebulization every 6 (six) hours as needed for wheezing or shortness of breath. 150 mL 1  . blood glucose meter kit and supplies KIT Dispense based on patient and insurance preference. Use up to four times daily as directed. (FOR ICD-9 250.00, 250.01). 1 each 0  . busPIRone (BUSPAR) 15 MG tablet Take 15 mg by mouth daily.   4  . famotidine (PEPCID) 20 MG tablet Take 20 mg by mouth daily as needed for heartburn or indigestion.    . fluticasone  (FLONASE) 50 MCG/ACT nasal spray Place 2 sprays into both nostrils daily as needed for allergies.    Marland Kitchen glipiZIDE (GLUCOTROL) 5 MG tablet Take 1 tablet (5 mg total) by mouth 2 (two) times daily before a meal. 60 tablet 2  . Insulin Syringe-Needle U-100 28G X 5/16" 0.5 ML MISC Use with lantus once a day 100 each 1  . LANTUS 100 UNIT/ML injection INJECT 30 UNITS SUBCUTANEOUSLY AT BEDTIME (Patient taking differently: Inject 30 Units into the skin daily. ) 10 mL 0  . levothyroxine (SYNTHROID) 50 MCG tablet Take 1 tablet by mouth once daily 90 tablet 1  . lisinopril (ZESTRIL) 20 MG tablet Take 1 tablet by mouth once daily (Patient taking differently: Take 20 mg by mouth daily. ) 90 tablet 0  . LORazepam (ATIVAN) 1 MG tablet Take 0.5-1.5 mg by mouth See admin instructions. Take 0.14m by mouth in the morning and 1.580mby mouth at bedtime as needed for anxiety.    . metFORMIN (GLUCOPHAGE) 500 MG tablet Take 2 tablets (1,000 mg total) by mouth 2 (two) times daily with a meal. 120 tablet 2  . metoprolol succinate (TOPROL-XL) 50 MG 24 hr tablet Take with or immediately following a meal. (Patient taking differently: Take 50 mg by mouth daily. Take with or immediately following a meal.) 180 tablet 0  . NONFORMULARY OR COMPOUNDED  ITEM Pt/ inr   Dx dvt   Tomorrow 04/05/2019  Please call office [phone] with results 1 each 0  . NONFORMULARY OR COMPOUNDED ITEM PT/ inr    Dx dvt 1 each 0  . NONFORMULARY OR COMPOUNDED ITEM Compression stockings  20-30 mm/hg  #1   Dx low ext edema 1 each 0  . OXYGEN Inhale 3 L into the lungs continuous.     Marland Kitchen QUEtiapine (SEROQUEL) 100 MG tablet Take 100 mg by mouth at bedtime.    . torsemide (DEMADEX) 20 MG tablet Take 2 tablets by mouth once daily 180 tablet 0  . zolpidem (AMBIEN) 10 MG tablet Take 10 mg by mouth at bedtime.     No current facility-administered medications on file prior to visit.   Allergies  Allergen Reactions  . Hydrocodone Other (See Comments)    Constipation,  hallucinations  . Norvasc [Amlodipine Besylate]     Marked swelling   Observations/Objective: Vitals:   12/11/19 1136  BP: (!) 116/59  Pulse: 70  SpO2: 96%  wt 317   7 days ago ----298 yesterday R low ext---  Small vesicular rash Mild edema--- much improved since last visit but rash is present and not resolving   Assessment and Plan: 1. Hypokalemia Hx of and she just started taking the diuretic again Potassium refilled  Check labs  - potassium chloride SA (KLOR-CON M20) 20 MEQ tablet; Take 1 tablet (20 mEq total) by mouth daily.  Dispense: 30 tablet; Refill: 2 - Basic metabolic panel; Future  2. Gastrointestinal hemorrhage, unspecified gastrointestinal hemorrhage type Stable-- no new symptoms Check labs  - CBC with Differential/Platelet; Future  3. Rash  - fluocinonide-emollient (LIDEX-E) 0.05 % cream; Apply 1 application topically 2 (two) times daily.  Dispense: 30 g; Refill: 2  4. Myalgia Probably from low k--- check labs  - traMADol (ULTRAM) 50 MG tablet; Take 1 tablet (50 mg total) by mouth every 8 (eight) hours as needed for up to 5 days.  Dispense: 15 tablet; Refill: 0   Follow Up Instructions:    I discussed the assessment and treatment plan with the patient. The patient was provided an opportunity to ask questions and all were answered. The patient agreed with the plan and demonstrated an understanding of the instructions.   The patient was advised to call back or seek an in-person evaluation if the symptoms worsen or if the condition fails to improve as anticipated.  I provided 30 minutes of non-face-to-face time during this encounter.   Ann Held, DO

## 2019-12-12 NOTE — Telephone Encounter (Signed)
Spoke with patient. Pt states she did not go to the hospital because she just had a argument with someone and that is what caused her BP to shoot up.

## 2019-12-12 NOTE — Telephone Encounter (Signed)
She needs ov here in person next week to check bp

## 2019-12-12 NOTE — Telephone Encounter (Signed)
Please schedule a in person visit for this patient please

## 2019-12-12 NOTE — Telephone Encounter (Signed)
Does not look like pt went to hosp---- please check on pt

## 2019-12-16 ENCOUNTER — Telehealth: Payer: Self-pay

## 2019-12-16 NOTE — Telephone Encounter (Signed)
FYI

## 2019-12-16 NOTE — Telephone Encounter (Signed)
After Hours Call:   Caller states the patient has one missed visit this week by patient request. Patient states she is in pain and unwilling to do therapy. She states the DR is aware and there is medicine that sheneeds to pickup

## 2019-12-17 NOTE — Telephone Encounter (Signed)
Done. Called Pt

## 2019-12-19 ENCOUNTER — Other Ambulatory Visit: Payer: Self-pay

## 2019-12-20 ENCOUNTER — Ambulatory Visit (INDEPENDENT_AMBULATORY_CARE_PROVIDER_SITE_OTHER): Payer: Medicare HMO | Admitting: Family Medicine

## 2019-12-20 ENCOUNTER — Telehealth: Payer: Self-pay | Admitting: Family Medicine

## 2019-12-20 ENCOUNTER — Encounter: Payer: Self-pay | Admitting: Family Medicine

## 2019-12-20 ENCOUNTER — Other Ambulatory Visit: Payer: Self-pay

## 2019-12-20 VITALS — BP 134/80 | HR 82 | Temp 97.5°F | Resp 18 | Ht 63.0 in | Wt 286.4 lb

## 2019-12-20 DIAGNOSIS — I10 Essential (primary) hypertension: Secondary | ICD-10-CM | POA: Diagnosis not present

## 2019-12-20 DIAGNOSIS — E039 Hypothyroidism, unspecified: Secondary | ICD-10-CM

## 2019-12-20 DIAGNOSIS — E876 Hypokalemia: Secondary | ICD-10-CM | POA: Diagnosis not present

## 2019-12-20 DIAGNOSIS — E1169 Type 2 diabetes mellitus with other specified complication: Secondary | ICD-10-CM

## 2019-12-20 DIAGNOSIS — E785 Hyperlipidemia, unspecified: Secondary | ICD-10-CM

## 2019-12-20 DIAGNOSIS — K922 Gastrointestinal hemorrhage, unspecified: Secondary | ICD-10-CM | POA: Diagnosis not present

## 2019-12-20 LAB — CBC WITH DIFFERENTIAL/PLATELET
Absolute Monocytes: 676 cells/uL (ref 200–950)
Basophils Absolute: 39 cells/uL (ref 0–200)
Basophils Relative: 0.6 %
Eosinophils Absolute: 202 cells/uL (ref 15–500)
Eosinophils Relative: 3.1 %
HCT: 29.5 % — ABNORMAL LOW (ref 35.0–45.0)
Hemoglobin: 9 g/dL — ABNORMAL LOW (ref 11.7–15.5)
Lymphs Abs: 1957 cells/uL (ref 850–3900)
MCH: 27.2 pg (ref 27.0–33.0)
MCHC: 30.5 g/dL — ABNORMAL LOW (ref 32.0–36.0)
MCV: 89.1 fL (ref 80.0–100.0)
MPV: 11.1 fL (ref 7.5–12.5)
Monocytes Relative: 10.4 %
Neutro Abs: 3627 cells/uL (ref 1500–7800)
Neutrophils Relative %: 55.8 %
Platelets: 354 10*3/uL (ref 140–400)
RBC: 3.31 10*6/uL — ABNORMAL LOW (ref 3.80–5.10)
RDW: 13.8 % (ref 11.0–15.0)
Total Lymphocyte: 30.1 %
WBC: 6.5 10*3/uL (ref 3.8–10.8)

## 2019-12-20 NOTE — Assessment & Plan Note (Signed)
Encouraged heart healthy diet, increase exercise, avoid trans fats, consider a krill oil cap daily 

## 2019-12-20 NOTE — Assessment & Plan Note (Signed)
Check labs F/u GI

## 2019-12-20 NOTE — Assessment & Plan Note (Signed)
Well controlled, no changes to meds. Encouraged heart healthy diet such as the DASH diet and exercise as tolerated.  °

## 2019-12-20 NOTE — Telephone Encounter (Signed)
Noted  

## 2019-12-20 NOTE — Telephone Encounter (Signed)
CALLER: Carleene Overlie # [phone] FAX # [phone]  Patient has two missed appointments this week. Please Fax to appointment information to [facility] after today appointment.

## 2019-12-20 NOTE — Progress Notes (Signed)
Patient ID: Sue [name], female    DOB: 1950-11-17  Age: 69 y.o. MRN: [id number]    Subjective:  Subjective  HPI Sue [name] presents for hosp f/u -- 3/3-3/22 For gi bleed  She had dvt last summer and was on coumadin  She developed gi bleed in nov/ dec-- coumadin stopped  egd and colon done ----on day she presented to er she had BRBPR she was also dizzy and light headed Pt was also sob  [facility] saw the pt and she had egd, colon and bleeding scan and bleeding source not found She received 17 u prbcs  Pt d/c with pt/ ot  Pt has been feeling better since D/C and has f/u with GI  Review of Systems  Constitutional: Negative for appetite change, diaphoresis, fatigue and unexpected weight change.  Eyes: Negative for pain, redness and visual disturbance.  Respiratory: Negative for cough, chest tightness, shortness of breath and wheezing.   Cardiovascular: Negative for chest pain, palpitations and leg swelling.  Endocrine: Negative for cold intolerance, heat intolerance, polydipsia, polyphagia and polyuria.  Genitourinary: Negative for difficulty urinating, dysuria and frequency.  Neurological: Negative for dizziness, light-headedness, numbness and headaches.    History Past Medical History:  Diagnosis Date  . Allergic rhinitis   . Depression   . Emphysema of lung ([place])    3L home O2  . GERD (gastroesophageal reflux disease)   . Hypertension   . Hypothyroidism   . Obesity, morbid, BMI 50 or higher ([employer])   . Stroke [facility]) 2016   TIA   . Urine incontinence     She has a past surgical history that includes Abdominal hysterectomy; Cesarean section; Colonoscopy with propofol (N/A, 08/05/2019); Colonoscopy (N/A, 08/19/2019); Colonoscopy with propofol (N/A, 11/19/2019); Givens capsule study (N/A, 11/19/2019); Hemostasis clip placement (11/19/2019); Submucosal tattoo injection (11/19/2019); Colonoscopy with propofol (N/A, 11/22/2019); Hemostasis clip placement (11/22/2019); Colonoscopy with propofol  (N/A, 11/23/2019); Esophagogastroduodenoscopy (egd) with propofol (N/A, 11/24/2019); Hot hemostasis (N/A, 11/24/2019); enteroscopy (N/A, 11/24/2019); Givens capsule study (N/A, 11/24/2019); Esophagogastroduodenoscopy (N/A, 11/27/2019); enteroscopy (N/A, 11/27/2019); Hot hemostasis (N/A, 11/27/2019); Givens capsule study (N/A, 11/28/2019); and Colonoscopy with propofol (N/A, 11/29/2019).   Her family history includes Asthma in her son and son; Bipolar disorder in her father; Depression in her father; Diabetes in her sister; Heart disease in her brother, father, paternal aunt, paternal grandmother, and paternal uncle; Heart disease (age of onset: 66) in her sister; Hyperlipidemia in her sister; Hypertension in her brother, father, and sister; Schizophrenia in her paternal aunt.She reports that she quit smoking about 8 years ago. Her smoking use included cigarettes. She started smoking about 47 years ago. She has a 40.00 pack-year smoking history. She has never used smokeless tobacco. She reports previous alcohol use. She reports that she does not use drugs.  Current Outpatient Medications on File Prior to Visit  Medication Sig Dispense Refill  . Accu-Chek FastClix Lancets MISC USE TO CHECK BLOOD SUGAR UP TO FOUR TIMES DAILY AS DIRECTED 306 each 0  . ACCU-CHEK GUIDE test strip USE TO CHECK BLOOD SUGAR UP TO FOUR TIMES DAILY 100 each 2  . albuterol (PROVENTIL HFA;VENTOLIN HFA) 108 (90 Base) MCG/ACT inhaler Inhale 1-2 puffs into the lungs every 6 (six) hours as needed for wheezing or shortness of breath.    Marland Kitchen albuterol (PROVENTIL) (2.5 MG/3ML) 0.083% nebulizer solution Take 3 mLs (2.5 mg total) by nebulization every 6 (six) hours as needed for wheezing or shortness of breath. 150 mL 1  . blood glucose meter  kit and supplies KIT Dispense based on patient and insurance preference. Use up to four times daily as directed. (FOR ICD-9 250.00, 250.01). 1 each 0  . busPIRone (BUSPAR) 15 MG tablet Take 15 mg by mouth daily.   4   . famotidine (PEPCID) 20 MG tablet Take 20 mg by mouth daily as needed for heartburn or indigestion.    . fluocinonide-emollient (LIDEX-E) 0.05 % cream Apply 1 application topically 2 (two) times daily. 30 g 2  . fluticasone (FLONASE) 50 MCG/ACT nasal spray Place 2 sprays into both nostrils daily as needed for allergies.    Marland Kitchen glipiZIDE (GLUCOTROL) 5 MG tablet Take 1 tablet (5 mg total) by mouth 2 (two) times daily before a meal. 60 tablet 2  . Insulin Syringe-Needle U-100 28G X 5/16" 0.5 ML MISC Use with lantus once a day 100 each 1  . LANTUS 100 UNIT/ML injection INJECT 30 UNITS SUBCUTANEOUSLY AT BEDTIME (Patient taking differently: Inject 30 Units into the skin daily. ) 10 mL 0  . levothyroxine (SYNTHROID) 50 MCG tablet Take 1 tablet by mouth once daily 90 tablet 1  . lisinopril (ZESTRIL) 20 MG tablet Take 1 tablet by mouth once daily (Patient taking differently: Take 20 mg by mouth daily. ) 90 tablet 0  . LORazepam (ATIVAN) 1 MG tablet Take 0.5-1.5 mg by mouth See admin instructions. Take 0.28m by mouth in the morning and 1.52mby mouth at bedtime as needed for anxiety.    . metFORMIN (GLUCOPHAGE) 500 MG tablet Take 2 tablets (1,000 mg total) by mouth 2 (two) times daily with a meal. 120 tablet 2  . metoprolol succinate (TOPROL-XL) 50 MG 24 hr tablet Take with or immediately following a meal. (Patient taking differently: Take 50 mg by mouth daily. Take with or immediately following a meal.) 180 tablet 0  . NONFORMULARY OR COMPOUNDED ITEM Pt/ inr   Dx dvt   Tomorrow 04/05/2019  Please call office [phone] results 1 each 0  . NONFORMULARY OR COMPOUNDED ITEM PT/ inr    Dx dvt 1 each 0  . NONFORMULARY OR COMPOUNDED ITEM Compression stockings  20-30 mm/hg  #1   Dx low ext edema 1 each 0  . OXYGEN Inhale 3 L into the lungs continuous.     . potassium chloride SA (KLOR-CON M20) 20 MEQ tablet Take 1 tablet (20 mEq total) by mouth daily. 30 tablet 2  . QUEtiapine (SEROQUEL) 100 MG tablet Take 100  mg by mouth at bedtime.    . torsemide (DEMADEX) 20 MG tablet Take 2 tablets by mouth once daily 180 tablet 0  . zolpidem (AMBIEN) 10 MG tablet Take 10 mg by mouth at bedtime.     No current facility-administered medications on file prior to visit.     Objective:  Objective  Physical Exam Constitutional:      Appearance: She is well-developed.  HENT:     Head: Normocephalic and atraumatic.     Right Ear: External ear normal.     Left Ear: External ear normal.  Eyes:     General:        Right eye: No discharge.        Left eye: No discharge.     Conjunctiva/sclera: Conjunctivae normal.  Neck:     Thyroid: No thyromegaly.     Vascular: No carotid bruit or JVD.  Cardiovascular:     Rate and Rhythm: Normal rate and regular rhythm.     Heart sounds: Normal heart sounds. No  murmur.  Pulmonary:     Effort: Pulmonary effort is normal. No respiratory distress.     Breath sounds: Normal breath sounds. No wheezing or rales.  Chest:     Chest wall: No tenderness.  Musculoskeletal:     Cervical back: Normal range of motion and neck supple.  Lymphadenopathy:     Cervical: No cervical adenopathy.  Neurological:     Mental Status: She is alert and oriented to person, place, and time.    BP 134/80 (BP Location: Right Arm, Patient Position: Sitting, Cuff Size: Large)   Pulse 82   Temp (!) 97.5 F (36.4 C) (Temporal)   Resp 18   Ht '5\' 3"'  (1.6 m)   Wt 286 lb 6.4 oz (129.9 kg)   SpO2 98%   BMI 50.73 kg/m  Wt Readings from Last 3 Encounters:  12/20/19 286 lb 6.4 oz (129.9 kg)  12/11/19 298 lb (135.2 kg)  11/29/19 270 lb 1 oz (122.5 kg)     Lab Results  Component Value Date   WBC 8.7 12/02/2019   HGB 8.0 (L) 12/02/2019   HCT 25.5 (L) 12/02/2019   PLT 142 (L) 12/02/2019   GLUCOSE 97 12/01/2019   CHOL 125 05/28/2019   TRIG 94.0 05/28/2019   HDL 32.00 (L) 05/28/2019   LDLCALC 74 05/28/2019   ALT 10 11/27/2019   AST 14 (L) 11/27/2019   NA 139 12/01/2019   K 4.5 12/01/2019    CL 113 (H) 12/01/2019   CREATININE 1.05 (H) 12/01/2019   BUN 11 12/01/2019   CO2 23 12/01/2019   TSH 7.383 (H) 02/25/2019   INR 1.2 11/23/2019   HGBA1C 7.9 (H) 11/13/2019    DG Chest 2 View  Result Date: 11/13/2019 CLINICAL DATA:  Shortness of breath EXAM: CHEST - 2 VIEW COMPARISON:  August 10, 2019 FINDINGS: The heart size and mediastinal contours are within normal limits. Both lungs are clear. The visualized skeletal structures are unremarkable. IMPRESSION: No active cardiopulmonary disease. Electronically Signed   By: Prudencio Pair M.D.   On: 11/13/2019 04:06   CT Angio Chest PE W and/or Wo Contrast  Result Date: 11/13/2019 CLINICAL DATA:  Shortness of breath, low hemoglobin, history of GI bleed EXAM: CT ANGIOGRAPHY CHEST WITH CONTRAST TECHNIQUE: Multidetector CT imaging of the chest was performed using the standard protocol during bolus administration of intravenous contrast. Multiplanar CT image reconstructions and MIPs were obtained to evaluate the vascular anatomy. CONTRAST:  58m OMNIPAQUE IOHEXOL 350 MG/ML SOLN COMPARISON:  August 02, 2019 FINDINGS: Cardiovascular: There is a optimal opacification of the pulmonary arteries. There is no central,segmental, or subsegmental filling defects within the pulmonary arteries. The heart is normal in size. No pericardial effusion or thickening. No evidence right heart strain. There is normal three-vessel brachiocephalic anatomy without proximal stenosis. The thoracic aorta is normal in appearance. Mediastinum/Nodes: No hilar, mediastinal, or axillary adenopathy. Thyroid gland, trachea, and esophagus demonstrate no significant findings. Lungs/Pleura: Minimal bibasilar dependent atelectasis is noted. No pleural effusion or pneumothorax. No airspace consolidation. Upper Abdomen: No acute abnormalities present in the visualized portions of the upper abdomen. Musculoskeletal: No chest wall abnormality. No acute or significant osseous findings. Review of  the MIP images confirms the above findings. Abdomen/pelvis: Hepatobiliary: The liver is normal in density without focal abnormality.The main portal vein is patent. Layering calcified gallstones are present. Pancreas: Unremarkable. No pancreatic ductal dilatation or surrounding inflammatory changes. Spleen: Normal in size without focal abnormality. Adrenals/Urinary Tract: Both adrenal glands appear normal. The kidneys and  collecting system appear normal without evidence of urinary tract calculus or hydronephrosis. Bladder is unremarkable. Stomach/Bowel: The stomach, small bowel, and colon are normal in appearance. Scattered colonic diverticula are noted without surrounding inflammatory changes. No inflammatory changes, wall thickening, or obstructive findings.The appendix is normal. Vascular/Lymphatic: There are no enlarged mesenteric, retroperitoneal, or pelvic lymph nodes. Scattered aortic atherosclerotic calcifications are seen without aneurysmal dilatation. Reproductive: The patient is status post hysterectomy. No adnexal masses or collections seen. Other: No evidence of abdominal wall mass or hernia. Musculoskeletal: No acute or significant osseous findings. IMPRESSION: No central, segmental, or subsegmental pulmonary embolism. No acute intrathoracic, abdominal, or pelvic pathology to explain the patient's symptoms. Diverticulosis without diverticulitis. Cholelithiasis Aortic Atherosclerosis (ICD10-I70.0). Electronically Signed   By: Prudencio Pair M.D.   On: 11/13/2019 06:08   CT ABDOMEN PELVIS W CONTRAST  Result Date: 11/13/2019 CLINICAL DATA:  Shortness of breath, low hemoglobin, history of GI bleed EXAM: CT ANGIOGRAPHY CHEST WITH CONTRAST TECHNIQUE: Multidetector CT imaging of the chest was performed using the standard protocol during bolus administration of intravenous contrast. Multiplanar CT image reconstructions and MIPs were obtained to evaluate the vascular anatomy. CONTRAST:  53m OMNIPAQUE IOHEXOL  350 MG/ML SOLN COMPARISON:  August 02, 2019 FINDINGS: Cardiovascular: There is a optimal opacification of the pulmonary arteries. There is no central,segmental, or subsegmental filling defects within the pulmonary arteries. The heart is normal in size. No pericardial effusion or thickening. No evidence right heart strain. There is normal three-vessel brachiocephalic anatomy without proximal stenosis. The thoracic aorta is normal in appearance. Mediastinum/Nodes: No hilar, mediastinal, or axillary adenopathy. Thyroid gland, trachea, and esophagus demonstrate no significant findings. Lungs/Pleura: Minimal bibasilar dependent atelectasis is noted. No pleural effusion or pneumothorax. No airspace consolidation. Upper Abdomen: No acute abnormalities present in the visualized portions of the upper abdomen. Musculoskeletal: No chest wall abnormality. No acute or significant osseous findings. Review of the MIP images confirms the above findings. Abdomen/pelvis: Hepatobiliary: The liver is normal in density without focal abnormality.The main portal vein is patent. Layering calcified gallstones are present. Pancreas: Unremarkable. No pancreatic ductal dilatation or surrounding inflammatory changes. Spleen: Normal in size without focal abnormality. Adrenals/Urinary Tract: Both adrenal glands appear normal. The kidneys and collecting system appear normal without evidence of urinary tract calculus or hydronephrosis. Bladder is unremarkable. Stomach/Bowel: The stomach, small bowel, and colon are normal in appearance. Scattered colonic diverticula are noted without surrounding inflammatory changes. No inflammatory changes, wall thickening, or obstructive findings.The appendix is normal. Vascular/Lymphatic: There are no enlarged mesenteric, retroperitoneal, or pelvic lymph nodes. Scattered aortic atherosclerotic calcifications are seen without aneurysmal dilatation. Reproductive: The patient is status post hysterectomy. No  adnexal masses or collections seen. Other: No evidence of abdominal wall mass or hernia. Musculoskeletal: No acute or significant osseous findings. IMPRESSION: No central, segmental, or subsegmental pulmonary embolism. No acute intrathoracic, abdominal, or pelvic pathology to explain the patient's symptoms. Diverticulosis without diverticulitis. Cholelithiasis Aortic Atherosclerosis (ICD10-I70.0). Electronically Signed   By: BPrudencio PairM.D.   On: 11/13/2019 06:08   DG CHEST PORT 1 VIEW  Result Date: 11/13/2019 CLINICAL DATA:  Dyspnea EXAM: PORTABLE CHEST 1 VIEW COMPARISON:  Earlier same day FINDINGS: Emphysema. No new consolidation or edema. No pleural effusion or pneumothorax. Stable cardiomediastinal contours. IMPRESSION: No new findings. Electronically Signed   By: PMacy MisM.D.   On: 11/13/2019 19:32     Assessment & Plan:  Plan  I am having Sue MedianE. HBronkemamaintain her OXYGEN, albuterol, albuterol, fluticasone, busPIRone, blood glucose meter  kit and supplies, Insulin Syringe-Needle U-100, QUEtiapine, famotidine, LORazepam, NONFORMULARY OR COMPOUNDED ITEM, NONFORMULARY OR COMPOUNDED ITEM, lisinopril, Accu-Chek Guide, NONFORMULARY OR COMPOUNDED ITEM, glipiZIDE, levothyroxine, Accu-Chek FastClix Lancets, zolpidem, metoprolol succinate, metFORMIN, Lantus, torsemide, potassium chloride SA, and fluocinonide-emollient.  No orders of the defined types were placed in this encounter.   Problem List Items Addressed This Visit      Unprioritized   GI bleed    Check labs F/u GI      Relevant Orders   CBC with Differential/Platelet   HTN (hypertension) - Primary (Chronic)    Well controlled, no changes to meds. Encouraged heart healthy diet such as the DASH diet and exercise as tolerated.       Relevant Orders   Comprehensive metabolic panel   Hyperlipidemia associated with type 2 diabetes mellitus ([place])    Encouraged heart healthy diet, increase exercise, avoid trans fats, consider a  krill oil cap daily      Hypokalemia    Check labs       Hypothyroidism   Relevant Orders   TSH      Follow-up: Return in about 3 months (around 03/20/2020), or if symptoms worsen or fail to improve, for hypertension, hyperlipidemia, diabetes II.  Ann Held, DO

## 2019-12-20 NOTE — Patient Instructions (Signed)
DASH Eating Plan DASH stands for "Dietary Approaches to Stop Hypertension." The DASH eating plan is a healthy eating plan that has been shown to reduce high blood pressure (hypertension). It may also reduce your risk for type 2 diabetes, heart disease, and stroke. The DASH eating plan may also help with weight loss. What are tips for following this plan?  General guidelines  Avoid eating more than 2,300 mg (milligrams) of salt (sodium) a day. If you have hypertension, you may need to reduce your sodium intake to 1,500 mg a day.  Limit alcohol intake to no more than 1 drink a day for nonpregnant women and 2 drinks a day for men. One drink equals 12 oz of beer, 5 oz of wine, or 1 oz of hard liquor.  Work with your health care provider to maintain a healthy body weight or to lose weight. Ask what an ideal weight is for you.  Get at least 30 minutes of exercise that causes your heart to beat faster (aerobic exercise) most days of the week. Activities may include walking, swimming, or biking.  Work with your health care provider or diet and nutrition specialist (dietitian) to adjust your eating plan to your individual calorie needs. Reading food labels   Check food labels for the amount of sodium per serving. Choose foods with less than 5 percent of the Daily Value of sodium. Generally, foods with less than 300 mg of sodium per serving fit into this eating plan.  To find whole grains, look for the word "whole" as the first word in the ingredient list. Shopping  Buy products labeled as "low-sodium" or "no salt added."  Buy fresh foods. Avoid canned foods and premade or frozen meals. Cooking  Avoid adding salt when cooking. Use salt-free seasonings or herbs instead of table salt or sea salt. Check with your health care provider or pharmacist before using salt substitutes.  Do not fry foods. Cook foods using healthy methods such as baking, boiling, grilling, and broiling instead.  Cook with  heart-healthy oils, such as olive, canola, soybean, or sunflower oil. Meal planning  Eat a balanced diet that includes: ? 5 or more servings of fruits and vegetables each day. At each meal, try to fill half of your plate with fruits and vegetables. ? Up to 6-8 servings of whole grains each day. ? Less than 6 oz of lean meat, poultry, or fish each day. A 3-oz serving of meat is about the same size as a deck of cards. One egg equals 1 oz. ? 2 servings of low-fat dairy each day. ? A serving of nuts, seeds, or beans 5 times each week. ? Heart-healthy fats. Healthy fats called Omega-3 fatty acids are found in foods such as flaxseeds and coldwater fish, like sardines, salmon, and mackerel.  Limit how much you eat of the following: ? Canned or prepackaged foods. ? Food that is high in trans fat, such as fried foods. ? Food that is high in saturated fat, such as fatty meat. ? Sweets, desserts, sugary drinks, and other foods with added sugar. ? Full-fat dairy products.  Do not salt foods before eating.  Try to eat at least 2 vegetarian meals each week.  Eat more home-cooked food and less restaurant, buffet, and fast food.  When eating at a restaurant, ask that your food be prepared with less salt or no salt, if possible. What foods are recommended? The items listed may not be a complete list. Talk with your dietitian about   what dietary choices are best for you. Grains Whole-grain or whole-wheat bread. Whole-grain or whole-wheat pasta. Brown rice. Oatmeal. Quinoa. Bulgur. Whole-grain and low-sodium cereals. Pita bread. Low-fat, low-sodium crackers. Whole-wheat flour tortillas. Vegetables Fresh or frozen vegetables (raw, steamed, roasted, or grilled). Low-sodium or reduced-sodium tomato and vegetable juice. Low-sodium or reduced-sodium tomato sauce and tomato paste. Low-sodium or reduced-sodium canned vegetables. Fruits All fresh, dried, or frozen fruit. Canned fruit in natural juice (without  added sugar). Meat and other protein foods Skinless chicken or turkey. Ground chicken or turkey. Pork with fat trimmed off. Fish and seafood. Egg whites. Dried beans, peas, or lentils. Unsalted nuts, nut butters, and seeds. Unsalted canned beans. Lean cuts of beef with fat trimmed off. Low-sodium, lean deli meat. Dairy Low-fat (1%) or fat-free (skim) milk. Fat-free, low-fat, or reduced-fat cheeses. Nonfat, low-sodium ricotta or cottage cheese. Low-fat or nonfat yogurt. Low-fat, low-sodium cheese. Fats and oils Soft margarine without trans fats. Vegetable oil. Low-fat, reduced-fat, or light mayonnaise and salad dressings (reduced-sodium). Canola, safflower, olive, soybean, and sunflower oils. Avocado. Seasoning and other foods Herbs. Spices. Seasoning mixes without salt. Unsalted popcorn and pretzels. Fat-free sweets. What foods are not recommended? The items listed may not be a complete list. Talk with your dietitian about what dietary choices are best for you. Grains Baked goods made with fat, such as croissants, muffins, or some breads. Dry pasta or rice meal packs. Vegetables Creamed or fried vegetables. Vegetables in a cheese sauce. Regular canned vegetables (not low-sodium or reduced-sodium). Regular canned tomato sauce and paste (not low-sodium or reduced-sodium). Regular tomato and vegetable juice (not low-sodium or reduced-sodium). Pickles. Olives. Fruits Canned fruit in a light or heavy syrup. Fried fruit. Fruit in cream or butter sauce. Meat and other protein foods Fatty cuts of meat. Ribs. Fried meat. Bacon. Sausage. Bologna and other processed lunch meats. Salami. Fatback. Hotdogs. Bratwurst. Salted nuts and seeds. Canned beans with added salt. Canned or smoked fish. Whole eggs or egg yolks. Chicken or turkey with skin. Dairy Whole or 2% milk, cream, and half-and-half. Whole or full-fat cream cheese. Whole-fat or sweetened yogurt. Full-fat cheese. Nondairy creamers. Whipped toppings.  Processed cheese and cheese spreads. Fats and oils Butter. Stick margarine. Lard. Shortening. Ghee. Bacon fat. Tropical oils, such as coconut, palm kernel, or palm oil. Seasoning and other foods Salted popcorn and pretzels. Onion salt, garlic salt, seasoned salt, table salt, and sea salt. Worcestershire sauce. Tartar sauce. Barbecue sauce. Teriyaki sauce. Soy sauce, including reduced-sodium. Steak sauce. Canned and packaged gravies. Fish sauce. Oyster sauce. Cocktail sauce. Horseradish that you find on the shelf. Ketchup. Mustard. Meat flavorings and tenderizers. Bouillon cubes. Hot sauce and Tabasco sauce. Premade or packaged marinades. Premade or packaged taco seasonings. Relishes. Regular salad dressings. Where to find more information:  National Heart, Lung, and Blood Institute: www.nhlbi.nih.gov  American Heart Association: www.heart.org Summary  The DASH eating plan is a healthy eating plan that has been shown to reduce high blood pressure (hypertension). It may also reduce your risk for type 2 diabetes, heart disease, and stroke.  With the DASH eating plan, you should limit salt (sodium) intake to 2,300 mg a day. If you have hypertension, you may need to reduce your sodium intake to 1,500 mg a day.  When on the DASH eating plan, aim to eat more fresh fruits and vegetables, whole grains, lean proteins, low-fat dairy, and heart-healthy fats.  Work with your health care provider or diet and nutrition specialist (dietitian) to adjust your eating plan to your   individual calorie needs. This information is not intended to replace advice given to you by your health care provider. Make sure you discuss any questions you have with your health care provider. Document Revised: 08/11/2017 Document Reviewed: 08/22/2016 Elsevier Patient Education  2020 [employer] Inc.  

## 2019-12-20 NOTE — Assessment & Plan Note (Signed)
Check labs 

## 2019-12-21 LAB — COMPREHENSIVE METABOLIC PANEL
AG Ratio: 0.7 (calc) — ABNORMAL LOW (ref 1.0–2.5)
ALT: 13 U/L (ref 6–29)
AST: 23 U/L (ref 10–35)
Albumin: 3 g/dL — ABNORMAL LOW (ref 3.6–5.1)
Alkaline phosphatase (APISO): 112 U/L (ref 37–153)
BUN/Creatinine Ratio: 6 (calc) (ref 6–22)
BUN: 9 mg/dL (ref 7–25)
CO2: 31 mmol/L (ref 20–32)
Calcium: 7.9 mg/dL — ABNORMAL LOW (ref 8.6–10.4)
Chloride: 98 mmol/L (ref 98–110)
Creat: 1.48 mg/dL — ABNORMAL HIGH (ref 0.50–0.99)
Globulin: 4.4 g/dL (calc) — ABNORMAL HIGH (ref 1.9–3.7)
Glucose, Bld: 75 mg/dL (ref 65–99)
Potassium: 3.6 mmol/L (ref 3.5–5.3)
Sodium: 140 mmol/L (ref 135–146)
Total Bilirubin: 0.3 mg/dL (ref 0.2–1.2)
Total Protein: 7.4 g/dL (ref 6.1–8.1)

## 2019-12-21 LAB — TSH: TSH: 10.14 mIU/L — ABNORMAL HIGH (ref 0.40–4.50)

## 2019-12-22 ENCOUNTER — Other Ambulatory Visit: Payer: Self-pay | Admitting: Family Medicine

## 2019-12-22 DIAGNOSIS — E039 Hypothyroidism, unspecified: Secondary | ICD-10-CM

## 2019-12-23 DIAGNOSIS — I9589 Other hypotension: Secondary | ICD-10-CM | POA: Diagnosis not present

## 2019-12-23 DIAGNOSIS — E1122 Type 2 diabetes mellitus with diabetic chronic kidney disease: Secondary | ICD-10-CM | POA: Diagnosis not present

## 2019-12-23 DIAGNOSIS — N1832 Chronic kidney disease, stage 3b: Secondary | ICD-10-CM | POA: Diagnosis not present

## 2019-12-23 DIAGNOSIS — I130 Hypertensive heart and chronic kidney disease with heart failure and stage 1 through stage 4 chronic kidney disease, or unspecified chronic kidney disease: Secondary | ICD-10-CM | POA: Diagnosis not present

## 2019-12-23 DIAGNOSIS — K922 Gastrointestinal hemorrhage, unspecified: Secondary | ICD-10-CM | POA: Diagnosis not present

## 2019-12-23 DIAGNOSIS — I5032 Chronic diastolic (congestive) heart failure: Secondary | ICD-10-CM | POA: Diagnosis not present

## 2019-12-23 DIAGNOSIS — D62 Acute posthemorrhagic anemia: Secondary | ICD-10-CM | POA: Diagnosis not present

## 2019-12-23 DIAGNOSIS — K921 Melena: Secondary | ICD-10-CM | POA: Diagnosis not present

## 2019-12-23 DIAGNOSIS — J9621 Acute and chronic respiratory failure with hypoxia: Secondary | ICD-10-CM | POA: Diagnosis not present

## 2019-12-24 ENCOUNTER — Other Ambulatory Visit: Payer: Self-pay

## 2019-12-24 MED ORDER — LEVOTHYROXINE SODIUM 75 MCG PO TABS: 75.0000 ug | ORAL_TABLET | Freq: Every day | ORAL | 1 refills | Status: DC

## 2019-12-27 DIAGNOSIS — I9589 Other hypotension: Secondary | ICD-10-CM | POA: Diagnosis not present

## 2019-12-27 DIAGNOSIS — E1122 Type 2 diabetes mellitus with diabetic chronic kidney disease: Secondary | ICD-10-CM | POA: Diagnosis not present

## 2019-12-27 DIAGNOSIS — I130 Hypertensive heart and chronic kidney disease with heart failure and stage 1 through stage 4 chronic kidney disease, or unspecified chronic kidney disease: Secondary | ICD-10-CM | POA: Diagnosis not present

## 2019-12-27 DIAGNOSIS — D62 Acute posthemorrhagic anemia: Secondary | ICD-10-CM | POA: Diagnosis not present

## 2019-12-27 DIAGNOSIS — N1832 Chronic kidney disease, stage 3b: Secondary | ICD-10-CM | POA: Diagnosis not present

## 2019-12-27 DIAGNOSIS — K922 Gastrointestinal hemorrhage, unspecified: Secondary | ICD-10-CM | POA: Diagnosis not present

## 2019-12-27 DIAGNOSIS — I5032 Chronic diastolic (congestive) heart failure: Secondary | ICD-10-CM | POA: Diagnosis not present

## 2019-12-27 DIAGNOSIS — J9621 Acute and chronic respiratory failure with hypoxia: Secondary | ICD-10-CM | POA: Diagnosis not present

## 2019-12-27 DIAGNOSIS — K921 Melena: Secondary | ICD-10-CM | POA: Diagnosis not present

## 2019-12-27 DIAGNOSIS — J440 Chronic obstructive pulmonary disease with acute lower respiratory infection: Secondary | ICD-10-CM | POA: Diagnosis not present

## 2019-12-31 ENCOUNTER — Telehealth: Payer: Self-pay | Admitting: Family Medicine

## 2019-12-31 DIAGNOSIS — F319 Bipolar disorder, unspecified: Secondary | ICD-10-CM | POA: Diagnosis not present

## 2019-12-31 MED ORDER — TIZANIDINE HCL 4 MG PO TABS: 4.0000 mg | ORAL_TABLET | Freq: Four times a day (QID) | ORAL | 1 refills | Status: DC | PRN

## 2019-12-31 NOTE — Telephone Encounter (Signed)
Patient called requesting script or any otc recommendation for the pain in her legs and feet. She stated during Phy Therapy today they told her that her muscles were tight and stiff. Please advise.

## 2019-12-31 NOTE — Telephone Encounter (Signed)
We can try tizanidine 4 mg tid prn #30

## 2019-12-31 NOTE — Telephone Encounter (Signed)
Please advise 

## 2019-12-31 NOTE — Telephone Encounter (Signed)
Med sent in. Pt made aware

## 2020-01-01 ENCOUNTER — Encounter (HOSPITAL_COMMUNITY): Payer: Self-pay | Admitting: Emergency Medicine

## 2020-01-01 ENCOUNTER — Other Ambulatory Visit: Payer: Self-pay

## 2020-01-01 ENCOUNTER — Emergency Department (HOSPITAL_COMMUNITY): Payer: Medicare HMO

## 2020-01-01 ENCOUNTER — Emergency Department (HOSPITAL_COMMUNITY)
Admission: EM | Admit: 2020-01-01 | Discharge: 2020-01-01 | Disposition: A | Discharge status: Home / Self Care | Payer: Medicare HMO | Attending: Emergency Medicine | Admitting: Emergency Medicine

## 2020-01-01 DIAGNOSIS — I4581 Long QT syndrome: Secondary | ICD-10-CM | POA: Diagnosis not present

## 2020-01-01 DIAGNOSIS — G459 Transient cerebral ischemic attack, unspecified: Secondary | ICD-10-CM | POA: Diagnosis not present

## 2020-01-01 DIAGNOSIS — Z87891 Personal history of nicotine dependence: Secondary | ICD-10-CM | POA: Diagnosis not present

## 2020-01-01 DIAGNOSIS — R4781 Slurred speech: Secondary | ICD-10-CM | POA: Diagnosis not present

## 2020-01-01 DIAGNOSIS — N183 Chronic kidney disease, stage 3 unspecified: Secondary | ICD-10-CM | POA: Insufficient documentation

## 2020-01-01 DIAGNOSIS — R0602 Shortness of breath: Secondary | ICD-10-CM | POA: Diagnosis not present

## 2020-01-01 DIAGNOSIS — R29810 Facial weakness: Secondary | ICD-10-CM | POA: Diagnosis present

## 2020-01-01 DIAGNOSIS — E039 Hypothyroidism, unspecified: Secondary | ICD-10-CM | POA: Insufficient documentation

## 2020-01-01 DIAGNOSIS — Z86718 Personal history of other venous thrombosis and embolism: Secondary | ICD-10-CM | POA: Diagnosis not present

## 2020-01-01 DIAGNOSIS — R200 Anesthesia of skin: Secondary | ICD-10-CM

## 2020-01-01 DIAGNOSIS — R0689 Other abnormalities of breathing: Secondary | ICD-10-CM | POA: Diagnosis not present

## 2020-01-01 DIAGNOSIS — I129 Hypertensive chronic kidney disease with stage 1 through stage 4 chronic kidney disease, or unspecified chronic kidney disease: Secondary | ICD-10-CM | POA: Insufficient documentation

## 2020-01-01 DIAGNOSIS — E1122 Type 2 diabetes mellitus with diabetic chronic kidney disease: Secondary | ICD-10-CM | POA: Insufficient documentation

## 2020-01-01 DIAGNOSIS — R531 Weakness: Secondary | ICD-10-CM | POA: Diagnosis not present

## 2020-01-01 HISTORY — DX: Type 2 diabetes mellitus with hyperglycemia: E11.65

## 2020-01-01 HISTORY — DX: Reserved for concepts with insufficient information to code with codable children: IMO0002

## 2020-01-01 LAB — COMPREHENSIVE METABOLIC PANEL
ALT: 11 U/L (ref 0–44)
AST: 23 U/L (ref 15–41)
Albumin: 2.7 g/dL — ABNORMAL LOW (ref 3.5–5.0)
Alkaline Phosphatase: 92 U/L (ref 38–126)
Anion gap: 11 (ref 5–15)
BUN: 11 mg/dL (ref 8–23)
CO2: 26 mmol/L (ref 22–32)
Calcium: 6.5 mg/dL — ABNORMAL LOW (ref 8.9–10.3)
Chloride: 96 mmol/L — ABNORMAL LOW (ref 98–111)
Creatinine, Ser: 1.89 mg/dL — ABNORMAL HIGH (ref 0.44–1.00)
GFR calc Af Amer: 31 mL/min — ABNORMAL LOW (ref >60)
GFR calc non Af Amer: 27 mL/min — ABNORMAL LOW (ref >60)
Glucose, Bld: 266 mg/dL — ABNORMAL HIGH (ref 70–99)
Potassium: 3.7 mmol/L (ref 3.5–5.1)
Sodium: 133 mmol/L — ABNORMAL LOW (ref 135–145)
Total Bilirubin: 0.5 mg/dL (ref 0.3–1.2)
Total Protein: 7.9 g/dL (ref 6.5–8.1)

## 2020-01-01 LAB — CBC WITH DIFFERENTIAL/PLATELET
Abs Immature Granulocytes: 0.06 10*3/uL (ref 0.00–0.07)
Basophils Absolute: 0 10*3/uL (ref 0.0–0.1)
Basophils Relative: 1 %
Eosinophils Absolute: 0.1 10*3/uL (ref 0.0–0.5)
Eosinophils Relative: 1 %
HCT: 28.4 % — ABNORMAL LOW (ref 36.0–46.0)
Hemoglobin: 8.9 g/dL — ABNORMAL LOW (ref 12.0–15.0)
Immature Granulocytes: 1 %
Lymphocytes Relative: 22 %
Lymphs Abs: 2 10*3/uL (ref 0.7–4.0)
MCH: 27 pg (ref 26.0–34.0)
MCHC: 31.3 g/dL (ref 30.0–36.0)
MCV: 86.1 fL (ref 80.0–100.0)
Monocytes Absolute: 0.8 10*3/uL (ref 0.1–1.0)
Monocytes Relative: 9 %
Neutro Abs: 5.9 10*3/uL (ref 1.7–7.7)
Neutrophils Relative %: 66 %
Platelets: 236 10*3/uL (ref 150–400)
RBC: 3.3 MIL/uL — ABNORMAL LOW (ref 3.87–5.11)
RDW: 15.5 % (ref 11.5–15.5)
WBC: 8.8 10*3/uL (ref 4.0–10.5)
nRBC: 0 % (ref 0.0–0.2)

## 2020-01-01 LAB — I-STAT CHEM 8, ED
BUN: 11 mg/dL (ref 8–23)
Calcium, Ion: 0.86 mmol/L — CL (ref 1.15–1.40)
Chloride: 93 mmol/L — ABNORMAL LOW (ref 98–111)
Creatinine, Ser: 1.8 mg/dL — ABNORMAL HIGH (ref 0.44–1.00)
Glucose, Bld: 261 mg/dL — ABNORMAL HIGH (ref 70–99)
HCT: 29 % — ABNORMAL LOW (ref 36.0–46.0)
Hemoglobin: 9.9 g/dL — ABNORMAL LOW (ref 12.0–15.0)
Potassium: 3.7 mmol/L (ref 3.5–5.1)
Sodium: 136 mmol/L (ref 135–145)
TCO2: 28 mmol/L (ref 22–32)

## 2020-01-01 LAB — PROTIME-INR
INR: 1.2 (ref 0.8–1.2)
Prothrombin Time: 14.8 seconds (ref 11.4–15.2)

## 2020-01-01 LAB — TROPONIN I (HIGH SENSITIVITY)
Troponin I (High Sensitivity): 5 ng/L (ref <18)
Troponin I (High Sensitivity): 4 ng/L (ref <18)

## 2020-01-01 LAB — APTT: aPTT: 35 seconds (ref 24–36)

## 2020-01-01 MED ORDER — FENTANYL CITRATE (PF) 100 MCG/2ML IJ SOLN
50.0000 ug | Freq: Once | INTRAMUSCULAR | Status: AC
  Administered 2020-01-01: 50 ug via INTRAVENOUS
  Filled 2020-01-01: qty 2

## 2020-01-01 MED ORDER — TRAMADOL HCL 50 MG PO TABS: 50.0000 mg | ORAL_TABLET | Freq: Four times a day (QID) | ORAL | 0 refills | Status: DC | PRN

## 2020-01-01 MED ORDER — SODIUM CHLORIDE 0.9 % IV SOLN
1.0000 g | Freq: Once | INTRAVENOUS | Status: AC
  Administered 2020-01-01: 1 g via INTRAVENOUS
  Filled 2020-01-01: qty 10

## 2020-01-01 MED ORDER — CALCIUM CARBONATE ANTACID 500 MG PO CHEW
1.0000 | CHEWABLE_TABLET | Freq: Once | ORAL | Status: AC
  Administered 2020-01-01: 200 mg via ORAL
  Filled 2020-01-01: qty 1

## 2020-01-01 NOTE — ED Provider Notes (Signed)
Emergency Department Provider Note   I have reviewed the triage vital signs and the nursing notes.   HISTORY  Chief Complaint Aphasia and Shortness of Breath   HPI Sue [name] is a 69 y.o. female with PMH of HTN, prior TIA, and elevated BMI emergency room for evaluation of acute onset speech change and subjective facial droop this AM.  Patient awoke this morning feeling fine.  She has recently been prescribed Zanaflex for acute on chronic left leg pain and symptoms began shortly afterwards.  Patient did take 2 doses of this medication last night and did not have symptoms after in that instance.  This morning she notes the above symptoms occurring for approximately 15 to 20 minutes.  She was home alone and so no one could confirm her face droop but she felt like her right face was "pulling" and drooping along with a change in her speech.  She tells me that she was talking to herself out loud in order to appreciate the change. She denies any extremity weakness/numbness.  She does note that her right face also felt numb as she touched it.  She does have a remote history of TIA but nothing recent. No other medication changes.    Past Medical History:  Diagnosis Date  . Allergic rhinitis   . Depression   . Diabetes mellitus type 2, uncontrolled ([place])   . Emphysema of lung ([place])    3L home O2  . GERD (gastroesophageal reflux disease)   . Hypertension   . Hypothyroidism   . Obesity, morbid, BMI 50 or higher ([place])   . Stroke [facility]) 2016   TIA   . Urine incontinence     Patient Active Problem List   Diagnosis Date Noted  . CKD (chronic kidney disease) stage 3, GFR 30-59 ml/min 11/13/2019  . Acute blood loss anemia 11/13/2019  . Lower GI bleed 08/16/2019  . Symptomatic anemia 08/03/2019  . QT prolongation 08/03/2019  . GI bleed 08/02/2019  . Diabetes mellitus type II, uncontrolled ([place]) 05/29/2019  . Hyperlipidemia associated with type 2 diabetes mellitus ([place]) 05/29/2019  . DVT  (deep venous thrombosis) ([place]) 03/17/2019  . Acute on chronic renal insufficiency 03/17/2019  . Macrocytic anemia 03/17/2019  . Small bowel obstruction ([place]) 03/08/2019  . Fracture of left tibia 02/25/2019  . Left tibial fracture 02/25/2019  . Hyperglycemia 08/16/2018  . Morbid obesity due to excess calories ([place]) 05/09/2018  . Acute bronchitis with COPD ([place]) 10/19/2017  . Atypical chest pain 05/01/2017  . Pulmonary emphysema ([place]) 04/06/2017  . Chronic seasonal allergic rhinitis 04/06/2017  . GERD (gastroesophageal reflux disease) 04/06/2017  . Snoring 04/06/2017  . Myalgia 02/01/2017  . Arthralgia 02/01/2017  . Chronic pain of toe of left foot 11/14/2016  . Chronic hepatitis C without hepatic coma ([place]) 08/01/2016  . Hypothyroidism 10/29/2015  . Mild diastolic dysfunction 99/37/1696  . Edema 01/01/2015  . Edema of both ankles 12/24/2014  . TIA (transient ischemic attack) 07/30/2014  . Weakness 07/29/2014  . Flank pain 11/12/2013  . Chronic respiratory failure ([place]) 10/25/2013  . DOE (dyspnea on exertion) 10/25/2013  . Depression with anxiety 10/15/2013  . Insomnia 10/15/2013  . HTN (hypertension) 10/09/2013  . Hypokalemia 10/09/2013    Past Surgical History:  Procedure Laterality Date  . ABDOMINAL HYSTERECTOMY    . CESAREAN SECTION    . COLONOSCOPY N/A 08/19/2019   Procedure: COLONOSCOPY;  Surgeon: Clarene Essex, MD;  Location: [facility];  Service: Endoscopy;  Laterality: N/A;  .  COLONOSCOPY WITH PROPOFOL N/A 08/05/2019   Procedure: COLONOSCOPY WITH PROPOFOL;  Surgeon: Wilford Corner, MD;  Location: [facility];  Service: Gastroenterology;  Laterality: N/A;  . COLONOSCOPY WITH PROPOFOL N/A 11/19/2019   Procedure: COLONOSCOPY WITH PROPOFOL;  Surgeon: Ronald Lobo, MD;  Location: [facility];  Service: Endoscopy;  Laterality: N/A;  Unprepped  . COLONOSCOPY WITH PROPOFOL N/A 11/22/2019   Procedure: COLONOSCOPY WITH PROPOFOL;  Surgeon: Ronald Lobo, MD;  Location:  [facility];  Service: Endoscopy;  Laterality: N/A;  . COLONOSCOPY WITH PROPOFOL N/A 11/23/2019   Procedure: COLONOSCOPY WITH PROPOFOL;  Surgeon: [name], MD;  Location: [facility];  Service: Gastroenterology;  Laterality: N/A;  . COLONOSCOPY WITH PROPOFOL N/A 11/29/2019   Procedure: COLONOSCOPY WITH PROPOFOL;  Surgeon: Wilford Corner, MD;  [facility];  Service: Endoscopy;  Laterality: N/A;  . ENTEROSCOPY N/A 11/24/2019   Procedure: ENTEROSCOPY;  Surgeon: [name], MD;  [facility];  Service: Gastroenterology;  Laterality: N/A;  . ENTEROSCOPY N/A 11/27/2019   Procedure: ENTEROSCOPY;  Surgeon: Wilford Corner, MD;  [facility];  Service: Endoscopy;  Laterality: N/A;  . ESOPHAGOGASTRODUODENOSCOPY N/A 11/27/2019   Procedure: ESOPHAGOGASTRODUODENOSCOPY (EGD);  Surgeon: Wilford Corner, MD;  [facility];  Service: Endoscopy;  Laterality: N/A;  . ESOPHAGOGASTRODUODENOSCOPY (EGD) WITH PROPOFOL N/A 11/24/2019   Procedure: ESOPHAGOGASTRODUODENOSCOPY (EGD) WITH PROPOFOL;  Surgeon: [name], MD;  [facility];  Service: Gastroenterology;  Laterality: N/A;  PUSH enteroscopy  . GIVENS CAPSULE STUDY N/A 11/19/2019   Procedure: GIVENS CAPSULE STUDY;  Surgeon: Ronald Lobo, MD;  [facility];  Service: Endoscopy;  Laterality: N/A;  To be performed immediately following colonoscopy  . GIVENS CAPSULE STUDY N/A 11/24/2019   Procedure: GIVENS CAPSULE STUDY;  Surgeon: [name], MD;  [facility];  Service: Gastroenterology;  Laterality: N/A;  . GIVENS CAPSULE STUDY N/A 11/28/2019   Procedure: GIVENS CAPSULE STUDY;  Surgeon: Wilford Corner, MD;  [facility];  Service: Endoscopy;  Laterality: N/A;  . HEMOSTASIS CLIP PLACEMENT  11/19/2019   Procedure: HEMOSTASIS CLIP PLACEMENT;  Surgeon: Ronald Lobo, MD;  [facility];  Service: Endoscopy;;  . HEMOSTASIS CLIP PLACEMENT  11/22/2019   Procedure: HEMOSTASIS  CLIP PLACEMENT;  Surgeon: Ronald Lobo, MD;  [facility];  Service: Endoscopy;;  . HOT HEMOSTASIS N/A 11/24/2019   Procedure: HOT HEMOSTASIS (ARGON PLASMA COAGULATION/BICAP);  Surgeon: [name], MD;  [facility];  Service: Gastroenterology;  Laterality: N/A;  . HOT HEMOSTASIS N/A 11/27/2019   Procedure: HOT HEMOSTASIS (ARGON PLASMA COAGULATION/BICAP);  Surgeon: Wilford Corner, MD;  Location[facility];  Service: Endoscopy;  Laterality: N/A;  . SUBMUCOSAL TATTOO INJECTION  11/19/2019   Procedure: SUBMUCOSAL TATTOO INJECTION;  Surgeon: Ronald Lobo, MD;  [facility];  Service: Endoscopy;;    Allergies Hydrocodone, Norvasc [amlodipine besylate], and Tizanidine  Family History  Problem Relation Age of Onset  . Heart disease Father        MVP and Pics Valve  . Hypertension Father   . Depression Father        Institutionalized x's 2 years  . Bipolar disorder Father   . Hypertension Sister   . Diabetes Sister   . Hyperlipidemia Sister   . Heart disease Sister 74       MI  . Heart disease Brother   . Hypertension Brother   . Heart disease Paternal Grandmother   . Heart disease Paternal Aunt   . Heart disease Paternal Uncle   . Schizophrenia Paternal Aunt   .  Asthma Son   . Asthma Son     Social History Social History   Tobacco Use  . Smoking status: Former Smoker    Packs/day: 1.00    Years: 40.00    Pack years: 40.00    Types: Cigarettes    Start date: 07/21/1972    Quit date: 10/16/2011    Years since quitting: 8.2  . Smokeless tobacco: Never Used  Substance Use Topics  . Alcohol use: Not Currently    Comment: Occ-- Wine  . Drug use: No    Review of Systems  Constitutional: No fever/chills Eyes: No visual changes. ENT: No sore throat. Cardiovascular: Denies chest pain. Respiratory: Denies shortness of breath. Gastrointestinal: No abdominal pain.  No nausea, no vomiting.  No diarrhea.  No constipation. Genitourinary:  Negative for dysuria. Musculoskeletal: Negative for back pain. Acute on chronic right leg pain.  Skin: Negative for rash. Neurological: Negative for headaches. Right face drooping and numbness. Positive change in speech now resolved.   10-point ROS otherwise negative.  ____________________________________________   PHYSICAL EXAM:  VITAL SIGNS: ED Triage Vitals  Enc Vitals Group     BP 01/01/20 0947 93/68     Pulse Rate 01/01/20 0947 73     Resp 01/01/20 0947 20     Temp 01/01/20 0947 99 F (37.2 C)     Temp src --      SpO2 01/01/20 0947 100 %     Weight 01/01/20 0945 284 lb 6.3 oz (129 kg)     Height 01/01/20 0945 5\' 3"  (1.6 m)   Constitutional: Alert and oriented. Well appearing and in no acute distress. Eyes: Conjunctivae are normal. PERRL.  Head: Atraumatic. Nose: No congestion/rhinnorhea. Mouth/Throat: Mucous membranes are moist.  Neck: No stridor.   Cardiovascular: Normal rate, regular rhythm. Good peripheral circulation. Grossly normal heart sounds.   Respiratory: Normal respiratory effort.  No retractions. Lungs CTAB. Gastrointestinal: Soft and nontender. No distention.  Musculoskeletal: No lower extremity tenderness nor edema. No gross deformities of extremities. Neurologic:  Normal speech and language. No gross focal neurologic deficits are appreciated.  No facial asymmetry or decreased sensation to palpation.  Normal EOM. No pronator drift.  Skin:  Skin is warm, dry and intact. No rash noted.   ____________________________________________   LABS (all labs ordered are listed, but only abnormal results are displayed)  Labs Reviewed  COMPREHENSIVE METABOLIC PANEL - Abnormal; Notable for the following components:      Result Value   Sodium 133 (*)    Chloride 96 (*)    Glucose, Bld 266 (*)    Creatinine, Ser 1.89 (*)    Calcium 6.5 (*)    Albumin 2.7 (*)    GFR calc non Af Amer 27 (*)    GFR calc Af Amer 31 (*)    All other components within normal limits   CBC WITH DIFFERENTIAL/PLATELET - Abnormal; Notable for the following components:   RBC 3.30 (*)    Hemoglobin 8.9 (*)    HCT 28.4 (*)    All other components within normal limits  I-STAT CHEM 8, ED - Abnormal; Notable for the following components:   Chloride 93 (*)    Creatinine, Ser 1.80 (*)    Glucose, Bld 261 (*)    Calcium, Ion 0.86 (*)    Hemoglobin 9.9 (*)    HCT 29.0 (*)    All other components within normal limits  PROTIME-INR  APTT  CBG MONITORING, ED  TROPONIN I (HIGH SENSITIVITY)  TROPONIN I (HIGH SENSITIVITY)   ____________________________________________  EKG   EKG Interpretation  Date/Time:  Wednesday January 01 2020 09:40:01 EDT Ventricular Rate:  73 PR Interval:  194 QRS Duration: 80 QT Interval:  464 QTC Calculation: 511 R Axis:   43 Text Interpretation: Normal sinus rhythm Prolonged QT Abnormal ECG No STEMI Confirmed by Nanda Quinton [phone]) on 01/01/2020 11:00:29 AM       ____________________________________________  RADIOLOGY  CT Head Wo Contrast  Result Date: 01/01/2020 CLINICAL DATA:  [facility], ems states they were called out for slurred speech that began approx 1 hour after pt took zanflex that she was prescribed yesterday for leg pain from a previous injury- has been taking it on an empty stomach, per her daughter EXAM: CT HEAD WITHOUT CONTRAST TECHNIQUE: Contiguous axial images were obtained from the base of the skull through the vertex without intravenous contrast. COMPARISON:  12/15/2015 FINDINGS: Brain: No evidence of acute infarction, hemorrhage, hydrocephalus, extra-axial collection or mass lesion/mass effect. Minor periventricular white matter hypoattenuation is noted consistent with chronic microvascular ischemic change. Vascular: No hyperdense vessel or unexpected calcification. Skull: Normal. Negative for fracture or focal lesion. Sinuses/Orbits: Old medial left orbital wall fracture. Globes and orbits are otherwise unremarkable.  Right maxillary sinus is mostly opacified. Remaining sinuses are clear. Other: None. IMPRESSION: 1. No acute intracranial abnormalities. 2. Minor chronic microvascular ischemic change. 3. Chronic appearing near complete opacification of the right maxillary sinus. Electronically Signed   By: Lajean Manes M.D.   On: 01/01/2020 12:23   DG Chest Portable 1 View  Result Date: 01/01/2020 CLINICAL DATA:  Shortness of breath. EXAM: PORTABLE CHEST 1 VIEW COMPARISON:  November 27, 2019. FINDINGS: The heart size and mediastinal contours are within normal limits. Both lungs are clear. No pneumothorax or pleural effusion is noted. The visualized skeletal structures are unremarkable. IMPRESSION: No active disease. Electronically Signed   By: Marijo Conception M.D.   On: 01/01/2020 10:57    ____________________________________________   PROCEDURES  Procedure(s) performed:   Procedures  None  ____________________________________________   INITIAL IMPRESSION / ASSESSMENT AND PLAN / ED COURSE  Pertinent labs & imaging results that were available during my care of the patient were reviewed by me and considered in my medical decision making (see chart for details).   Patient presents emergency department for evaluation of TIA-like symptoms.  Could be related to the Zanaflex that she took but she took this medication twice yesterday without similar symptoms.  No one witnessed the face droop but patient felt weakness there subjectively and when touching the area felt numb just on the right side of her face. Normal neuro exam here.   Spoke with Dr. Malen Gauze who saw the patient in consult.  Recommends MRI and if negative could be discharged home with outpatient neurology follow-up rather than admission for TIA.   Calcium is also low to  Care transferred to Dr. Vanita Panda pending MRI. Calcium is low to 6.5.  Patient has had calciums in the low 7 range chronically.  She does not have any acute symptoms.  We will bladder  scan to rule out acute urinary retention and replace calcium while waiting for MRI brain.  ____________________________________________  FINAL CLINICAL IMPRESSION(S) / ED DIAGNOSES  Final diagnoses:  TIA (transient ischemic attack)  Prolonged Q-T interval on ECG    MEDICATIONS GIVEN DURING THIS VISIT:  Medications  calcium gluconate 1 g in sodium chloride 0.9 % 100 mL IVPB (has no administration in time range)  fentaNYL (SUBLIMAZE) injection 50 mcg (50 mcg Intravenous Given 01/01/20 1117)    Note:  This document was prepared using Dragon voice recognition software and may include unintentional dictation errors.  Nanda Quinton, MD, [facility] Emergency Medicine    Riti Rollyson, [facility], MD 01/01/20 [phone]

## 2020-01-01 NOTE — ED Triage Notes (Addendum)
[facility] arrives [facility], ems states they were called out for slurred speech that began approx 1 hour after pt took zanflex that she was prescribed yesterday for leg pain from a previous injury- has been taking it on an empty stomach, per her daughter. Pts ambien and ativan were also increased by her pcp yesterday. EMS vss 116/86, 100% on 4L (baseline) 239 cbg, HR 76. Speech mildly slurred, face symmetrical, grip strength equal, no other neuro deficits noted, a/ox4. Pt also endorses some sob that began after taking her medications this morning as well. Pt talking on phone upon arrival to triage, falling asleep while having blood drawn.

## 2020-01-01 NOTE — ED Notes (Signed)
Patient verbalizes understanding of discharge instructions. Opportunity for questioning and answers were provided. Armband removed by staff, pt discharged from ED with daughter to transport pt home

## 2020-01-01 NOTE — ED Notes (Signed)
Pt transported to MRI 

## 2020-01-01 NOTE — ED Notes (Signed)
Labs was reported to Nurse Lovena Le, in Triage.

## 2020-01-01 NOTE — Consult Note (Signed)
Neurology Consultation  Reason for Consult: Facial numbness, slurred speech Referring Physician: Dr. Nanda Quinton  CC: Facial numbness, slurred speech  History is obtained from: Patient, chart  HPI: Sue [name] is a 69 y.o. female past medical history of depression, emphysema on 3 L home oxygen, hypertension, hypothyroidism, prior history of TIA many years ago-with some numbness and slurred speech and no residual deficits, diabetes, presented to the emergency room for evaluation of facial numbness and slurred speech. She reports that she started taking Zanaflex yesterday for her left leg pain that has been persistent since her left leg fracture. She did not have any side effects after the first dose but when she took a dose this morning as soon after, she started noticing some numbness on the left side of the face and slurred speech.  She called her daughter who also felt that her speech was slurred.  Of note, her Ambien and Ativan have been increased by the PCP yesterday and she did take increasing doses of sedating medications. Her symptoms of the numbness on the face and slurred speech lasted about 5 minutes and then resolved. At this time, she does not have any tingling numbness or weakness on one side of the body but reports residual left leg weakness and severe pain in the left leg for which she requests pain medication.  At the triage, she kept falling asleep as labs were being drawn.  Neurological consultation was obtained for the focal neurological symptoms that resolved for concern for TIA.  LKW: Difficult to ascertain as she is not a great historian but was sometime last night. tpa given?: no, outside the window Premorbid modified Rankin scale (mRS): 3  ROS: Review of systems obtained and negative except noted in HPI.  Past Medical History:  Diagnosis Date  . Allergic rhinitis   . Depression   . Diabetes mellitus type 2, uncontrolled ([place])   . Emphysema of lung ([place])    3L  home O2  . GERD (gastroesophageal reflux disease)   . Hypertension   . Hypothyroidism   . Obesity, morbid, BMI 50 or higher ([place])   . Stroke [facility]) 2016   TIA   . Urine incontinence     Family History  Problem Relation Age of Onset  . Heart disease Father        MVP and Pics Valve  . Hypertension Father   . Depression Father        Institutionalized x's 2 years  . Bipolar disorder Father   . Hypertension Sister   . Diabetes Sister   . Hyperlipidemia Sister   . Heart disease Sister 47       MI  . Heart disease Brother   . Hypertension Brother   . Heart disease Paternal Grandmother   . Heart disease Paternal Aunt   . Heart disease Paternal Uncle   . Schizophrenia Paternal Aunt   . Asthma Son   . Asthma Son    Social History:   reports that she quit smoking about 8 years ago. Her smoking use included cigarettes. She started smoking about 47 years ago. She has a 40.00 pack-year smoking history. She has never used smokeless tobacco. She reports previous alcohol use. She reports that she does not use drugs.  Medications No current facility-administered medications for this encounter.  Current Outpatient Medications:  .  albuterol (PROVENTIL HFA;VENTOLIN HFA) 108 (90 Base) MCG/ACT inhaler, Inhale 1-2 puffs into the lungs every 6 (six) hours as needed for wheezing or  shortness of breath., Disp: , Rfl:  .  albuterol (PROVENTIL) (2.5 MG/3ML) 0.083% nebulizer solution, Take 3 mLs (2.5 mg total) by nebulization every 6 (six) hours as needed for wheezing or shortness of breath., Disp: 150 mL, Rfl: 1 .  busPIRone (BUSPAR) 15 MG tablet, Take 15 mg by mouth at bedtime. , Disp: , Rfl: 4 .  LANTUS 100 UNIT/ML injection, INJECT 30 UNITS SUBCUTANEOUSLY AT BEDTIME (Patient taking differently: Inject 30 Units into the skin at bedtime. ), Disp: 10 mL, Rfl: 0 .  levothyroxine (SYNTHROID) 75 MCG tablet, Take 1 tablet (75 mcg total) by mouth daily., Disp: 90 tablet, Rfl: 1 .  LORazepam (ATIVAN) 1 MG  tablet, Take 0.5-1.5 mg by mouth See admin instructions. Take 1-1.5 mg by mouth at bedtime and an additional 0.5 mg once a day as needed for anxiety, Disp: , Rfl:  .  metFORMIN (GLUCOPHAGE) 500 MG tablet, Take 2 tablets (1,000 mg total) by mouth 2 (two) times daily with a meal., Disp: 120 tablet, Rfl: 2 .  metoprolol succinate (TOPROL-XL) 50 MG 24 hr tablet, Take with or immediately following a meal. (Patient taking differently: Take 100 mg by mouth daily after supper. Take with or immediately following a meal.), Disp: 180 tablet, Rfl: 0 .  OXYGEN, Inhale 3-4 L/min into the lungs continuous. , Disp: , Rfl:  .  potassium chloride SA (KLOR-CON M20) 20 MEQ tablet, Take 1 tablet (20 mEq total) by mouth daily., Disp: 30 tablet, Rfl: 2 .  QUEtiapine (SEROQUEL) 100 MG tablet, Take 150 mg by mouth at bedtime. , Disp: , Rfl:  .  tiZANidine (ZANAFLEX) 4 MG tablet, Take 1 tablet (4 mg total) by mouth every 6 (six) hours as needed for muscle spasms., Disp: 60 tablet, Rfl: 1 .  zolpidem (AMBIEN) 10 MG tablet, Take 10 mg by mouth at bedtime., Disp: , Rfl:  .  Accu-Chek FastClix Lancets MISC, USE TO CHECK BLOOD SUGAR UP TO FOUR TIMES DAILY AS DIRECTED, Disp: 306 each, Rfl: 0 .  ACCU-CHEK GUIDE test strip, USE TO CHECK BLOOD SUGAR UP TO FOUR TIMES DAILY, Disp: 100 each, Rfl: 2 .  blood glucose meter kit and supplies KIT, Dispense based on patient and insurance preference. Use up to four times daily as directed. (FOR ICD-9 250.00, 250.01)., Disp: 1 each, Rfl: 0 .  famotidine (PEPCID) 20 MG tablet, Take 20 mg by mouth daily as needed for heartburn or indigestion., Disp: , Rfl:  .  fluocinonide-emollient (LIDEX-E) 0.05 % cream, Apply 1 application topically 2 (two) times daily., Disp: 30 g, Rfl: 2 .  fluticasone (FLONASE) 50 MCG/ACT nasal spray, Place 2 sprays into both nostrils daily as needed for allergies., Disp: , Rfl:  .  glipiZIDE (GLUCOTROL) 5 MG tablet, Take 1 tablet (5 mg total) by mouth 2 (two) times daily  before a meal., Disp: 60 tablet, Rfl: 2 .  Insulin Syringe-Needle U-100 28G X 5/16" 0.5 ML MISC, Use with lantus once a day, Disp: 100 each, Rfl: 1 .  lisinopril (ZESTRIL) 20 MG tablet, Take 1 tablet by mouth once daily (Patient taking differently: Take 20 mg by mouth daily. ), Disp: 90 tablet, Rfl: 0 .  NONFORMULARY OR COMPOUNDED ITEM, Pt/ inr   Dx dvt   Tomorrow 04/05/2019  Please call office [phone] with results (Patient not taking: Reported on 01/01/2020), Disp: 1 each, Rfl: 0 .  NONFORMULARY OR COMPOUNDED ITEM, PT/ inr    Dx dvt (Patient not taking: Reported on 01/01/2020), Disp: 1 each, Rfl:  0 .  NONFORMULARY OR COMPOUNDED ITEM, Compression stockings  20-30 mm/hg  #1   Dx low ext edema, Disp: 1 each, Rfl: 0 .  torsemide (DEMADEX) 20 MG tablet, Take 2 tablets by mouth once daily, Disp: 180 tablet, Rfl: 0   Exam: Current vital signs: BP 93/68   Pulse 73   Temp 99 F (37.2 C)   Resp 20   Ht '5\' 3"'  (1.6 m)   Wt 129 kg   SpO2 100%   BMI 50.38 kg/m  Vital signs in last 24 hours: Temp:  [99 F (37.2 C)] 99 F (37.2 C) (04/21 0947) Pulse Rate:  [73] 73 (04/21 0947) Resp:  [20] 20 (04/21 0947) BP: (93)/(68) 93/68 (04/21 0947) SpO2:  [100 %] 100 % (04/21 0947) Weight:  [948 kg] 129 kg (04/21 0945) General: Obese woman, in no distress HEENT: Cephalic atraumatic Lungs: On home oxygen, scattered rales CVS regular rate rhythm Extremities: Extremely tender to touch left leg with decreased range of motion.  Edema on the right and left lower extremities mild. Neurological exam She is awake alert oriented x3 Her speech is clear There is no evidence of aphasia Cranial nerve examination: Pupils equal round reactive light, extraocular movements intact, visual fields full, facial sensation intact bilaterally, face is symmetric, auditory acuity intact, tongue and palate are midline. Motor exam: Both upper extremities 5/5 without drift.  Right lower extremity 5/5 without drift.  Left lower  extremity exam limited due to pain but is antigravity. Sensory: Intact light touch all over No evidence of extinction on double simultaneous stimulation Coordination: Intact finger-nose-finger testing  NIHSS-0  Labs I have reviewed labs in epic and the results pertinent to this consultation are:  CBC    Component Value Date/Time   WBC 8.8 01/01/2020 1120   RBC 3.30 (L) 01/01/2020 1120   HGB 8.9 (L) 01/01/2020 1120   HGB 12.6 01/17/2018 1616   HCT 28.4 (L) 01/01/2020 1120   HCT 32.3 (L) 03/10/2019 0545   PLT 236 01/01/2020 1120   PLT 235 01/17/2018 1616   MCV 86.1 01/01/2020 1120   MCV 96 01/17/2018 1616   MCH 27.0 01/01/2020 1120   MCHC 31.3 01/01/2020 1120   RDW 15.5 01/01/2020 1120   RDW 14.0 01/17/2018 1616   LYMPHSABS 2.0 01/01/2020 1120   MONOABS 0.8 01/01/2020 1120   EOSABS 0.1 01/01/2020 1120   BASOSABS 0.0 01/01/2020 1120    CMP     Component Value Date/Time   NA 136 01/01/2020 0958   NA 140 01/31/2018 1514   K 3.7 01/01/2020 0958   CL 93 (L) 01/01/2020 0958   CO2 26 01/01/2020 0951   GLUCOSE 261 (H) 01/01/2020 0958   BUN 11 01/01/2020 0958   BUN 15 01/31/2018 1514   CREATININE 1.80 (H) 01/01/2020 0958   CREATININE 1.48 (H) 12/20/2019 1442   CALCIUM 6.5 (L) 01/01/2020 0951   PROT 7.9 01/01/2020 0951   ALBUMIN 2.7 (L) 01/01/2020 0951   AST 23 01/01/2020 0951   ALT 11 01/01/2020 0951   ALKPHOS 92 01/01/2020 0951   BILITOT 0.5 01/01/2020 0951   GFRNONAA 27 (L) 01/01/2020 0951   GFRAA 31 (L) 01/01/2020 0951   Imaging I have reviewed the images obtained:  CT-scan of the brain-no acute changes  Assessment:  69 year old with above past medical history presenting for evaluation of facial numbness and slurred speech that lasted about 5 minutes this morning. Patient is a poor historian. She also started Zanaflex yesterday.  Also her  Ambien and Ativan doses were increased and she was falling asleep while talking at the triage. I do not suspect her  symptoms are related to a stroke or TIA but she has enough risk factors for me to pursue at least further imaging in the form of an MRI. Her ABCD 2 score at best is 3 and does not necessarily entail inpatient work-up. I think her symptoms are likely related to her medications and anxiety.  Impression: Slurred speech and facial numbness likely related to medication side effect Anxiety/depression Chronic pain with acute exacerbation of the pain.  Recommendations: Obtain MRI of the brain-if negative, no further neurological work-up. Antiplatelet-aspirin 81 Follow-up with outpatient primary care If symptoms recur, return to the ER. We will follow up imaging. Plan discussed with Dr. Laverta Baltimore in the ER.  -- Amie Portland, MD [facility] Pager: [phone] If 7pm to 7am, please call on call as listed on AMION.

## 2020-01-01 NOTE — ED Notes (Signed)
Attempted to call daughter about transporting pt home and could not get a response. Will attempt again shortly. Pt stated she called her and let her know to come get her.

## 2020-01-01 NOTE — ED Notes (Signed)
Got patient on the monitor patient is resting with call bell in reach and nurse at bedside

## 2020-01-01 NOTE — ED Notes (Signed)
Updated pt's daughter about plan of care

## 2020-01-01 NOTE — Discharge Instructions (Signed)
As discussed, your evaluation today has been largely reassuring.  But, it is important that you monitor your condition carefully, and do not hesitate to return to the ED if you develop new, or concerning changes in your condition. ? ?Otherwise, please follow-up with your physician for appropriate ongoing care. ? ?

## 2020-01-01 NOTE — ED Provider Notes (Signed)
6:21 PM I discussed the patient's reassuring MRI results with her at length.  Patient notes that she has a primary care physician with whom she can follow-up.  We discussed the importance of doing so.  Patient requests pain medication for her ongoing lower extremity issues, we discussed options, in the context of the patient's known medical issues.  Patient requests short refill of tramadol pending outpatient follow-up, at which point she will discuss other options include gabapentin, Lyrica with her physician.    Sue Muskrat, MD 01/01/20 Sue [name]

## 2020-01-01 NOTE — ED Notes (Signed)
Hooked patient back up to the monitor patient is resting with call bell in reach 

## 2020-01-03 ENCOUNTER — Telehealth: Payer: Self-pay

## 2020-01-03 DIAGNOSIS — J9621 Acute and chronic respiratory failure with hypoxia: Secondary | ICD-10-CM | POA: Diagnosis not present

## 2020-01-03 DIAGNOSIS — K921 Melena: Secondary | ICD-10-CM | POA: Diagnosis not present

## 2020-01-03 DIAGNOSIS — N1832 Chronic kidney disease, stage 3b: Secondary | ICD-10-CM | POA: Diagnosis not present

## 2020-01-03 DIAGNOSIS — I5032 Chronic diastolic (congestive) heart failure: Secondary | ICD-10-CM | POA: Diagnosis not present

## 2020-01-03 DIAGNOSIS — I130 Hypertensive heart and chronic kidney disease with heart failure and stage 1 through stage 4 chronic kidney disease, or unspecified chronic kidney disease: Secondary | ICD-10-CM | POA: Diagnosis not present

## 2020-01-03 DIAGNOSIS — E1122 Type 2 diabetes mellitus with diabetic chronic kidney disease: Secondary | ICD-10-CM | POA: Diagnosis not present

## 2020-01-03 DIAGNOSIS — D62 Acute posthemorrhagic anemia: Secondary | ICD-10-CM | POA: Diagnosis not present

## 2020-01-03 DIAGNOSIS — I9589 Other hypotension: Secondary | ICD-10-CM | POA: Diagnosis not present

## 2020-01-03 DIAGNOSIS — K922 Gastrointestinal hemorrhage, unspecified: Secondary | ICD-10-CM | POA: Diagnosis not present

## 2020-01-03 NOTE — Telephone Encounter (Signed)
Patients Physical therapist Clair Gulling called in to let Dr. Etter Sjogren know that the patient was in the ER this Week. Missed physical therapy appointment. Physical therapist will be see the patient on next  week. If there is any questions please call Clair Gulling at  [phone]

## 2020-01-03 NOTE — Telephone Encounter (Signed)
Noted  

## 2020-01-07 ENCOUNTER — Ambulatory Visit (INDEPENDENT_AMBULATORY_CARE_PROVIDER_SITE_OTHER): Payer: Medicare HMO | Admitting: Family Medicine

## 2020-01-07 ENCOUNTER — Other Ambulatory Visit: Payer: Self-pay

## 2020-01-07 ENCOUNTER — Other Ambulatory Visit: Payer: Self-pay | Admitting: *Deleted

## 2020-01-07 ENCOUNTER — Encounter: Payer: Self-pay | Admitting: Family Medicine

## 2020-01-07 VITALS — BP 140/90 | HR 92 | Temp 97.0°F | Resp 18 | Ht 63.0 in | Wt 286.0 lb

## 2020-01-07 DIAGNOSIS — B182 Chronic viral hepatitis C: Secondary | ICD-10-CM | POA: Diagnosis not present

## 2020-01-07 DIAGNOSIS — D649 Anemia, unspecified: Secondary | ICD-10-CM

## 2020-01-07 DIAGNOSIS — D509 Iron deficiency anemia, unspecified: Secondary | ICD-10-CM

## 2020-01-07 DIAGNOSIS — E1165 Type 2 diabetes mellitus with hyperglycemia: Secondary | ICD-10-CM | POA: Diagnosis not present

## 2020-01-07 DIAGNOSIS — E785 Hyperlipidemia, unspecified: Secondary | ICD-10-CM | POA: Diagnosis not present

## 2020-01-07 DIAGNOSIS — J439 Emphysema, unspecified: Secondary | ICD-10-CM | POA: Diagnosis not present

## 2020-01-07 DIAGNOSIS — I5032 Chronic diastolic (congestive) heart failure: Secondary | ICD-10-CM | POA: Diagnosis not present

## 2020-01-07 DIAGNOSIS — J9611 Chronic respiratory failure with hypoxia: Secondary | ICD-10-CM

## 2020-01-07 DIAGNOSIS — E1169 Type 2 diabetes mellitus with other specified complication: Secondary | ICD-10-CM | POA: Diagnosis not present

## 2020-01-07 DIAGNOSIS — E039 Hypothyroidism, unspecified: Secondary | ICD-10-CM

## 2020-01-07 DIAGNOSIS — I824Y2 Acute embolism and thrombosis of unspecified deep veins of left proximal lower extremity: Secondary | ICD-10-CM | POA: Diagnosis not present

## 2020-01-07 DIAGNOSIS — R5383 Other fatigue: Secondary | ICD-10-CM

## 2020-01-07 DIAGNOSIS — N1832 Chronic kidney disease, stage 3b: Secondary | ICD-10-CM

## 2020-01-07 LAB — IBC + FERRITIN
Ferritin: 22.1 ng/mL (ref 10.0–291.0)
Iron: 23 ug/dL — ABNORMAL LOW (ref 42–145)
Saturation Ratios: 5.2 % — ABNORMAL LOW (ref 20.0–50.0)
Transferrin: 316 mg/dL (ref 212.0–360.0)

## 2020-01-07 LAB — CBC WITH DIFFERENTIAL/PLATELET
Basophils Absolute: 0 10*3/uL (ref 0.0–0.1)
Basophils Relative: 0.4 % (ref 0.0–3.0)
Eosinophils Absolute: 0.2 10*3/uL (ref 0.0–0.7)
Eosinophils Relative: 2.9 % (ref 0.0–5.0)
HCT: 28.9 % — ABNORMAL LOW (ref 36.0–46.0)
Hemoglobin: 9.3 g/dL — ABNORMAL LOW (ref 12.0–15.0)
Lymphocytes Relative: 23.9 % (ref 12.0–46.0)
Lymphs Abs: 1.7 10*3/uL (ref 0.7–4.0)
MCHC: 32.1 g/dL (ref 30.0–36.0)
MCV: 82.8 fl (ref 78.0–100.0)
Monocytes Absolute: 0.6 10*3/uL (ref 0.1–1.0)
Monocytes Relative: 8.5 % (ref 3.0–12.0)
Neutro Abs: 4.6 10*3/uL (ref 1.4–7.7)
Neutrophils Relative %: 64.3 % (ref 43.0–77.0)
Platelets: 327 10*3/uL (ref 150.0–400.0)
RBC: 3.49 Mil/uL — ABNORMAL LOW (ref 3.87–5.11)
RDW: 17.3 % — ABNORMAL HIGH (ref 11.5–15.5)
WBC: 7.2 10*3/uL (ref 4.0–10.5)

## 2020-01-07 LAB — COMPREHENSIVE METABOLIC PANEL
ALT: 9 U/L (ref 0–35)
AST: 17 U/L (ref 0–37)
Albumin: 3.3 g/dL — ABNORMAL LOW (ref 3.5–5.2)
Alkaline Phosphatase: 111 U/L (ref 39–117)
BUN: 7 mg/dL (ref 6–23)
CO2: 32 mEq/L (ref 19–32)
Calcium: 7.8 mg/dL — ABNORMAL LOW (ref 8.4–10.5)
Chloride: 99 mEq/L (ref 96–112)
Creatinine, Ser: 1.18 mg/dL (ref 0.40–1.20)
GFR: 55.2 mL/min — ABNORMAL LOW (ref >60.00)
Glucose, Bld: 80 mg/dL (ref 70–99)
Potassium: 4.1 mEq/L (ref 3.5–5.1)
Sodium: 137 mEq/L (ref 135–145)
Total Bilirubin: 0.4 mg/dL (ref 0.2–1.2)
Total Protein: 7.8 g/dL (ref 6.0–8.3)

## 2020-01-07 LAB — LIPID PANEL
Cholesterol: 105 mg/dL (ref 0–200)
HDL: 23.1 mg/dL — ABNORMAL LOW (ref >39.00)
LDL Cholesterol: 67 mg/dL (ref 0–99)
NonHDL: 81.97
Total CHOL/HDL Ratio: 5
Triglycerides: 76 mg/dL (ref 0.0–149.0)
VLDL: 15.2 mg/dL (ref 0.0–40.0)

## 2020-01-07 LAB — VITAMIN B12: Vitamin B-12: 448 pg/mL (ref 211–911)

## 2020-01-07 LAB — HEMOGLOBIN A1C: Hgb A1c MFr Bld: 5.7 % (ref 4.6–6.5)

## 2020-01-07 LAB — TSH: TSH: 4.46 u[IU]/mL (ref 0.35–4.50)

## 2020-01-07 MED ORDER — INSULIN GLARGINE 100 UNIT/ML ~~LOC~~ SOLN: SUBCUTANEOUS | 1 refills | Status: DC

## 2020-01-07 NOTE — Progress Notes (Signed)
Patient ID: Sue [name], female    DOB: September 10, 1951  Age: 69 y.o. MRN: [id number]    Subjective:  Subjective  HPI Sue [name] presents for er f/u and f/u labs   She said she is feeling better but is still tired   Her daughter is with her who is a med Environmental consultant  Review of Systems  Constitutional: Positive for fatigue. Negative for appetite change, diaphoresis and unexpected weight change.  Eyes: Negative for pain, redness and visual disturbance.  Respiratory: Negative for cough, chest tightness, shortness of breath and wheezing.   Cardiovascular: Negative for chest pain, palpitations and leg swelling.  Endocrine: Negative for cold intolerance, heat intolerance, polydipsia, polyphagia and polyuria.  Genitourinary: Negative for difficulty urinating, dysuria and frequency.  Neurological: Positive for weakness. Negative for dizziness, light-headedness, numbness and headaches.    History Past Medical History:  Diagnosis Date  . Allergic rhinitis   . Depression   . Diabetes mellitus type 2, uncontrolled ([place])   . Emphysema of lung ([place])    3L home O2  . GERD (gastroesophageal reflux disease)   . Hypertension   . Hypothyroidism   . Obesity, morbid, BMI 50 or higher ([place])   . Stroke [facility]) 2016   TIA   . Urine incontinence     She has a past surgical history that includes Abdominal hysterectomy; Cesarean section; Colonoscopy with propofol (N/A, 08/05/2019); Colonoscopy (N/A, 08/19/2019); Colonoscopy with propofol (N/A, 11/19/2019); Givens capsule study (N/A, 11/19/2019); Hemostasis clip placement (11/19/2019); Submucosal tattoo injection (11/19/2019); Colonoscopy with propofol (N/A, 11/22/2019); Hemostasis clip placement (11/22/2019); Colonoscopy with propofol (N/A, 11/23/2019); Esophagogastroduodenoscopy (egd) with propofol (N/A, 11/24/2019); Hot hemostasis (N/A, 11/24/2019); enteroscopy (N/A, 11/24/2019); Givens capsule study (N/A, 11/24/2019); Esophagogastroduodenoscopy (N/A, 11/27/2019); enteroscopy  (N/A, 11/27/2019); Hot hemostasis (N/A, 11/27/2019); Givens capsule study (N/A, 11/28/2019); and Colonoscopy with propofol (N/A, 11/29/2019).   Her family history includes Asthma in her son and son; Bipolar disorder in her father; Depression in her father; Diabetes in her sister; Heart disease in her brother, father, paternal aunt, paternal grandmother, and paternal uncle; Heart disease (age of onset: 24) in her sister; Hyperlipidemia in her sister; Hypertension in her brother, father, and sister; Schizophrenia in her paternal aunt.She reports that she quit smoking about 8 years ago. Her smoking use included cigarettes. She started smoking about 47 years ago. She has a 40.00 pack-year smoking history. She has never used smokeless tobacco. She reports previous alcohol use. She reports that she does not use drugs.  Current Outpatient Medications on File Prior to Visit  Medication Sig Dispense Refill  . Accu-Chek FastClix Lancets MISC USE TO CHECK BLOOD SUGAR UP TO FOUR TIMES DAILY AS DIRECTED (Patient taking differently: See admin instructions. Up to 4 times a day) 306 each 0  . ACCU-CHEK GUIDE test strip USE TO CHECK BLOOD SUGAR UP TO FOUR TIMES DAILY (Patient taking differently: See admin instructions. Up to 4 times a day) 100 each 2  . albuterol (PROVENTIL HFA;VENTOLIN HFA) 108 (90 Base) MCG/ACT inhaler Inhale 1-2 puffs into the lungs every 6 (six) hours as needed for wheezing or shortness of breath.    Marland Kitchen albuterol (PROVENTIL) (2.5 MG/3ML) 0.083% nebulizer solution Take 3 mLs (2.5 mg total) by nebulization every 6 (six) hours as needed for wheezing or shortness of breath. 150 mL 1  . blood glucose meter kit and supplies KIT Dispense based on patient and insurance preference. Use up to four times daily as directed. (FOR ICD-9 250.00, 250.01). 1 each 0  .  busPIRone (BUSPAR) 15 MG tablet Take 15 mg by mouth at bedtime.   4  . famotidine (PEPCID) 20 MG tablet Take 20 mg by mouth daily as needed for heartburn or  indigestion.    . fluocinonide-emollient (LIDEX-E) 0.05 % cream Apply 1 application topically 2 (two) times daily. (Patient taking differently: Apply 1 application topically See admin instructions. Apply to rash on buttocks twice a day) 30 g 2  . fluticasone (FLONASE) 50 MCG/ACT nasal spray Place 2 sprays into both nostrils daily as needed for allergies.    Marland Kitchen glipiZIDE (GLUCOTROL) 5 MG tablet Take 1 tablet (5 mg total) by mouth 2 (two) times daily before a meal. (Patient taking differently: Take 5 mg by mouth daily before supper. ) 60 tablet 2  . Insulin Syringe-Needle U-100 28G X 5/16" 0.5 ML MISC Use with lantus once a day 100 each 1  . levothyroxine (SYNTHROID) 75 MCG tablet Take 1 tablet (75 mcg total) by mouth daily. 90 tablet 1  . lisinopril (ZESTRIL) 20 MG tablet Take 1 tablet by mouth once daily (Patient taking differently: Take 20 mg by mouth daily. ) 90 tablet 0  . LORazepam (ATIVAN) 1 MG tablet Take 0.5-1.5 mg by mouth See admin instructions. Take 1-1.5 mg by mouth at bedtime and an additional 0.5 mg once a day as needed for anxiety    . metFORMIN (GLUCOPHAGE) 500 MG tablet Take 2 tablets (1,000 mg total) by mouth 2 (two) times daily with a meal. 120 tablet 2  . metoprolol succinate (TOPROL-XL) 50 MG 24 hr tablet Take with or immediately following a meal. (Patient taking differently: Take 100 mg by mouth daily after supper. Take with or immediately following a meal.) 180 tablet 0  . NONFORMULARY OR COMPOUNDED ITEM Pt/ inr   Dx dvt   Tomorrow 04/05/2019  Please call office [phone] with results 1 each 0  . NONFORMULARY OR COMPOUNDED ITEM PT/ inr    Dx dvt 1 each 0  . NONFORMULARY OR COMPOUNDED ITEM Compression stockings  20-30 mm/hg  #1   Dx low ext edema 1 each 0  . OXYGEN Inhale 3-4 L/min into the lungs continuous.     . potassium chloride SA (KLOR-CON M20) 20 MEQ tablet Take 1 tablet (20 mEq total) by mouth daily. 30 tablet 2  . QUEtiapine (SEROQUEL) 100 MG tablet Take 150 mg by mouth at  bedtime.     . torsemide (DEMADEX) 20 MG tablet Take 2 tablets by mouth once daily (Patient taking differently: Take 40 mg by mouth every evening. ) 180 tablet 0  . traMADol (ULTRAM) 50 MG tablet Take 1 tablet (50 mg total) by mouth every 6 (six) hours as needed. 15 tablet 0  . zolpidem (AMBIEN) 10 MG tablet Take 10 mg by mouth at bedtime.     No current facility-administered medications on file prior to visit.     Objective:  Objective  Physical Exam Vitals and nursing note reviewed.  Constitutional:      Appearance: She is well-developed.     Comments: Face looks pale   HENT:     Head: Normocephalic and atraumatic.  Eyes:     Conjunctiva/sclera: Conjunctivae normal.  Neck:     Thyroid: No thyromegaly.     Vascular: No carotid bruit or JVD.  Cardiovascular:     Rate and Rhythm: Normal rate and regular rhythm.     Heart sounds: Normal heart sounds. No murmur.  Pulmonary:     Effort: Pulmonary effort is  normal. No respiratory distress.     Breath sounds: Normal breath sounds. No wheezing or rales.     Comments: On O2 via [place] Chest:     Chest wall: No tenderness.  Musculoskeletal:     Cervical back: Normal range of motion and neck supple.  Neurological:     Mental Status: She is alert and oriented to person, place, and time.    BP 140/90 (BP Location: Left Arm, Patient Position: Sitting, Cuff Size: Large)   Pulse 92   Temp (!) 97 F (36.1 C) (Temporal)   Resp 18   Ht '5\' 3"'  (1.6 m)   Wt 286 lb (129.7 kg)   SpO2 98%   BMI 50.66 kg/m  Wt Readings from Last 3 Encounters:  01/07/20 286 lb (129.7 kg)  01/01/20 284 lb 6.3 oz (129 kg)  12/20/19 286 lb 6.4 oz (129.9 kg)     Lab Results  Component Value Date   WBC 7.2 01/07/2020   HGB 9.3 (L) 01/07/2020   HCT 28.9 (L) 01/07/2020   PLT 327.0 01/07/2020   GLUCOSE 80 01/07/2020   CHOL 105 01/07/2020   TRIG 76.0 01/07/2020   HDL 23.10 (L) 01/07/2020   LDLCALC 67 01/07/2020   ALT 9 01/07/2020   AST 17 01/07/2020   NA  137 01/07/2020   K 4.1 01/07/2020   CL 99 01/07/2020   CREATININE 1.18 01/07/2020   BUN 7 01/07/2020   CO2 32 01/07/2020   TSH 4.46 01/07/2020   INR 1.2 01/01/2020   HGBA1C 5.7 01/07/2020    CT Head Wo Contrast  Result Date: 01/01/2020 CLINICAL DATA:  [facility], [facility] states they were called out for slurred speech that began approx 1 hour after pt took zanflex that she was prescribed yesterday for leg pain from a previous injury- has been taking it on an empty stomach, per her daughter EXAM: CT HEAD WITHOUT CONTRAST TECHNIQUE: Contiguous axial images were obtained from the base of the skull through the vertex without intravenous contrast. COMPARISON:  12/15/2015 FINDINGS: Brain: No evidence of acute infarction, hemorrhage, hydrocephalus, extra-axial collection or mass lesion/mass effect. Minor periventricular white matter hypoattenuation is noted consistent with chronic microvascular ischemic change. Vascular: No hyperdense vessel or unexpected calcification. Skull: Normal. Negative for fracture or focal lesion. Sinuses/Orbits: Old medial left orbital wall fracture. Globes and orbits are otherwise unremarkable. Right maxillary sinus is mostly opacified. Remaining sinuses are clear. Other: None. IMPRESSION: 1. No acute intracranial abnormalities. 2. Minor chronic microvascular ischemic change. 3. Chronic appearing near complete opacification of the right maxillary sinus. Electronically Signed   By: Lajean Manes M.D.   On: 01/01/2020 12:23   MR BRAIN WO CONTRAST  Result Date: 01/01/2020 CLINICAL DATA:  TIA. Transient left facial numbness and slurred speech. EXAM: MRI HEAD WITHOUT CONTRAST TECHNIQUE: Multiplanar, multiecho pulse sequences of the brain and surrounding structures were obtained without intravenous contrast. COMPARISON:  Head CT 01/01/2020 and MRI 07/30/2014 FINDINGS: Brain: There is no evidence of acute infarct, intracranial hemorrhage, mass, midline shift, or extra-axial fluid  collection. The ventricles and sulci are normal. T2 hyperintensities in the cerebral white matter bilaterally are unchanged from the prior MRI and nonspecific but compatible with minimal chronic small vessel ischemic disease. Vascular: Major intracranial vascular flow voids are preserved. Skull and upper cervical spine: Unremarkable bone marrow signal. Sinuses/Orbits: Remote medial left orbital fracture. Chronic right sphenoid sinusitis. Clear mastoid air cells. Other: None. IMPRESSION: 1. No acute intracranial abnormality. 2. Minimal chronic small vessel  ischemic disease. Electronically Signed   By: Logan Bores M.D.   On: 01/01/2020 16:57   DG Chest Portable 1 View  Result Date: 01/01/2020 CLINICAL DATA:  Shortness of breath. EXAM: PORTABLE CHEST 1 VIEW COMPARISON:  November 27, 2019. FINDINGS: The heart size and mediastinal contours are within normal limits. Both lungs are clear. No pneumothorax or pleural effusion is noted. The visualized skeletal structures are unremarkable. IMPRESSION: No active disease. Electronically Signed   By: Marijo Conception M.D.   On: 01/01/2020 10:57     Assessment & Plan:  Plan  I am having Varnell E. Tocco maintain her OXYGEN, albuterol, albuterol, fluticasone, busPIRone, blood glucose meter kit and supplies, Insulin Syringe-Needle U-100, QUEtiapine, famotidine, LORazepam, NONFORMULARY OR COMPOUNDED ITEM, NONFORMULARY OR COMPOUNDED ITEM, lisinopril, Accu-Chek Guide, NONFORMULARY OR COMPOUNDED ITEM, glipiZIDE, Accu-Chek FastClix Lancets, zolpidem, metoprolol succinate, metFORMIN, torsemide, potassium chloride SA, fluocinonide-emollient, levothyroxine, traMADol, and insulin glargine.  No orders of the defined types were placed in this encounter.   Problem List Items Addressed This Visit      Unprioritized   Chronic diastolic heart failure ([place])    Controlled con't to weight daily F/u cardiology      Chronic hepatitis C without hepatic coma (HCC)    Pt has not f/u  with ID Pt needs f/u       Chronic respiratory failure (HCC) (Chronic)    On O2 via [place] \ Followed by pulmonary      CKD (chronic kidney disease) stage 3, GFR 30-59 ml/min (Chronic)    Per nephrology      Diabetes mellitus type II, uncontrolled ([place]) (Chronic)    Check labs  hgba1c to be checked, minimize simple carbs. Increase exercise as tolerated. Continue current meds       Relevant Orders   Hemoglobin A1c (Completed)   Comprehensive metabolic panel (Completed)   Hyperlipidemia associated with type 2 diabetes mellitus ([place])    Encouraged heart healthy diet, increase exercise, avoid trans fats, consider a krill oil cap daily      Relevant Orders   Lipid panel (Completed)   Comprehensive metabolic panel (Completed)   Hypothyroidism    con't meds Check labs  Stable Lab Results  Component Value Date   TSH 4.46 01/07/2020         Relevant Orders   TSH (Completed)   Morbid obesity due to excess calories (HCC) (Chronic)    Pt can now go to healthy weight and wellness with her daughter       Pulmonary emphysema ([place])    Per pulmonary      Symptomatic anemia    Unable to tolerate iron po due to stomach upset and constipation Will refer to hematology for iron possible iron transfusion       Other Visit Diagnoses    Iron deficiency anemia, unspecified iron deficiency anemia type    -  Primary   Relevant Orders   Ambulatory referral to Hematology   CBC with Differential/Platelet (Completed)   IBC + Ferritin (Completed)   Other fatigue       Relevant Orders   TSH (Completed)   Comprehensive metabolic panel (Completed)   Vitamin B12 (Completed)   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  Ann Held, DO

## 2020-01-07 NOTE — Patient Instructions (Signed)
Iron Deficiency Anemia, Adult Iron-deficiency anemia is when you have a low amount of red blood cells or hemoglobin. This happens because you have too little iron in your body. Hemoglobin carries oxygen to parts of the body. Anemia can cause your body to not get enough oxygen. It may or may not cause symptoms. Follow these instructions at home: Medicines  Take over-the-counter and prescription medicines only as told by your doctor. This includes iron pills (supplements) and vitamins.  If you cannot handle taking iron pills by mouth, ask your doctor about getting iron through: ? A vein (intravenously). ? A shot (injection) into a muscle.  Take iron pills when your stomach is empty. If you cannot handle this, take them with food.  Do not drink milk or take antacids at the same time as your iron pills.  To prevent trouble pooping (constipation), eat fiber or take medicine (stool softener) as told by your doctor. Eating and drinking   Talk with your doctor before changing the foods you eat. He or she may tell you to eat foods that have a lot of iron, such as: ? Liver. ? Lowfat (lean) beef. ? Breads and cereals that have iron added to them (fortified breads and cereals). ? Eggs. ? Dried fruit. ? Dark green, leafy vegetables.  Drink enough fluid to keep your pee (urine) clear or pale yellow.  Eat fresh fruits and vegetables that are high in vitamin C. They help your body to use iron. Foods with a lot of vitamin C include: ? Oranges. ? Peppers. ? Tomatoes. ? Mangoes. General instructions  Return to your normal activities as told by your doctor. Ask your doctor what activities are safe for you.  Keep yourself clean, and keep things clean around you (your surroundings). Anemia can make you get sick more easily.  Keep all follow-up visits as told by your doctor. This is important. Contact a doctor if:  You feel sick to your stomach (nauseous).  You throw up (vomit).  You feel  weak.  You are sweating for no clear reason.  You have trouble pooping, such as: ? Pooping (having a bowel movement) less than 3 times a week. ? Straining to poop. ? Having poop that is hard, dry, or larger than normal. ? Feeling full or bloated. ? Pain in the lower belly. ? Not feeling better after pooping. Get help right away if:  You pass out (faint). If this happens, do not drive yourself to the hospital. Call your local emergency services (911 in the [place]  You have chest pain.  You have shortness of breath that: ? Is very bad. ? Gets worse with physical activity.  You have a fast heartbeat.  You get light-headed when getting up from sitting or lying down. This information is not intended to replace advice given to you by your health care provider. Make sure you discuss any questions you have with your health care provider. Document Revised: 08/11/2017 Document Reviewed: 05/18/2016 Elsevier Patient Education  2020 Elsevier Inc.  

## 2020-01-08 ENCOUNTER — Other Ambulatory Visit (INDEPENDENT_AMBULATORY_CARE_PROVIDER_SITE_OTHER): Payer: Medicare HMO

## 2020-01-08 DIAGNOSIS — I5032 Chronic diastolic (congestive) heart failure: Secondary | ICD-10-CM | POA: Insufficient documentation

## 2020-01-08 LAB — MAGNESIUM: Magnesium: 1.3 mg/dL — ABNORMAL LOW (ref 1.5–2.5)

## 2020-01-08 NOTE — Assessment & Plan Note (Signed)
Per pulmonary 

## 2020-01-08 NOTE — Assessment & Plan Note (Signed)
Check labs  hgba1c to be checked, minimize simple carbs. Increase exercise as tolerated. Continue current meds  

## 2020-01-08 NOTE — Assessment & Plan Note (Signed)
Pt can now go to healthy weight and wellness with her daughter

## 2020-01-08 NOTE — Assessment & Plan Note (Signed)
Unable to tolerate iron po due to stomach upset and constipation Will refer to hematology for iron possible iron transfusion

## 2020-01-08 NOTE — Assessment & Plan Note (Signed)
Pt has not f/u with ID Pt needs f/u

## 2020-01-08 NOTE — Assessment & Plan Note (Signed)
con't meds Check labs  Stable Lab Results  Component Value Date   TSH 4.46 01/07/2020

## 2020-01-08 NOTE — Assessment & Plan Note (Signed)
Encouraged heart healthy diet, increase exercise, avoid trans fats, consider a krill oil cap daily 

## 2020-01-08 NOTE — Assessment & Plan Note (Signed)
On O2 via [place] \ Followed by pulmonary

## 2020-01-08 NOTE — Assessment & Plan Note (Signed)
Per nephrology 

## 2020-01-08 NOTE — Assessment & Plan Note (Signed)
Controlled con't to weight daily F/u cardiology

## 2020-01-09 ENCOUNTER — Telehealth: Payer: Self-pay

## 2020-01-09 DIAGNOSIS — K922 Gastrointestinal hemorrhage, unspecified: Secondary | ICD-10-CM | POA: Diagnosis not present

## 2020-01-09 DIAGNOSIS — I9589 Other hypotension: Secondary | ICD-10-CM | POA: Diagnosis not present

## 2020-01-09 DIAGNOSIS — J9621 Acute and chronic respiratory failure with hypoxia: Secondary | ICD-10-CM | POA: Diagnosis not present

## 2020-01-09 DIAGNOSIS — N1832 Chronic kidney disease, stage 3b: Secondary | ICD-10-CM | POA: Diagnosis not present

## 2020-01-09 DIAGNOSIS — I5032 Chronic diastolic (congestive) heart failure: Secondary | ICD-10-CM | POA: Diagnosis not present

## 2020-01-09 DIAGNOSIS — I130 Hypertensive heart and chronic kidney disease with heart failure and stage 1 through stage 4 chronic kidney disease, or unspecified chronic kidney disease: Secondary | ICD-10-CM | POA: Diagnosis not present

## 2020-01-09 DIAGNOSIS — K921 Melena: Secondary | ICD-10-CM | POA: Diagnosis not present

## 2020-01-09 DIAGNOSIS — D62 Acute posthemorrhagic anemia: Secondary | ICD-10-CM | POA: Diagnosis not present

## 2020-01-09 DIAGNOSIS — E1122 Type 2 diabetes mellitus with diabetic chronic kidney disease: Secondary | ICD-10-CM | POA: Diagnosis not present

## 2020-01-09 NOTE — Telephone Encounter (Signed)
Herbert Deaner, PT, w/[facility] called to report pt reports 8/10 pain from sit to stand in L ankle that reduces to 2/10 after some walking.  Pt has a 1 visit this week with him, but has requested to miss it.  Clair Gulling is requesting additional visits at 1x per week for 2 weeks starting 01/19/2020.  He also reports pt has no meds to treat pain.  Clair Gulling understands pt has a pending pulmonology appt for SOB, but pt's O2 stays above 91 on four liters per minute.  Clair Gulling is requesting call back for VO at [phone].

## 2020-01-10 DIAGNOSIS — K922 Gastrointestinal hemorrhage, unspecified: Secondary | ICD-10-CM | POA: Diagnosis not present

## 2020-01-10 DIAGNOSIS — K5521 Angiodysplasia of colon with hemorrhage: Secondary | ICD-10-CM | POA: Diagnosis not present

## 2020-01-10 NOTE — Telephone Encounter (Signed)
Verbal given 

## 2020-01-11 ENCOUNTER — Ambulatory Visit: Payer: Medicare HMO | Attending: Internal Medicine

## 2020-01-11 DIAGNOSIS — Z23 Encounter for immunization: Secondary | ICD-10-CM

## 2020-01-11 NOTE — Progress Notes (Signed)
   Covid-19 Vaccination Clinic  Name:  BRANDAN GLAUBER    MRN: [id number] DOB: 06/05/1951  01/11/2020  Ms. Ausburn was observed post Covid-19 immunization for 15 minutes without incident. She was provided with Vaccine Information Sheet and instruction to access the V-Safe system.   Ms. Leoni was instructed to call 911 with any severe reactions post vaccine: Marland Kitchen Difficulty breathing  . Swelling of face and throat  . A fast heartbeat  . A bad rash all over body  . Dizziness and weakness   Immunizations Administered    Name Date Dose VIS Date Route   Pfizer COVID-19 Vaccine 01/11/2020 10:20 AM 0.3 mL 11/06/2018 Intramuscular   Manufacturer: [place]   Lot: [id number]   [place]: [address]-4

## 2020-01-14 ENCOUNTER — Telehealth: Payer: Self-pay

## 2020-01-14 ENCOUNTER — Other Ambulatory Visit: Payer: Self-pay

## 2020-01-14 MED ORDER — MAGNESIUM 400 MG PO CAPS: 1.0000 | ORAL_CAPSULE | Freq: Every day | ORAL | 0 refills | Status: DC

## 2020-01-14 NOTE — Telephone Encounter (Signed)
Noted  

## 2020-01-14 NOTE — Telephone Encounter (Signed)
Patient Physical therapist Herbert Deaner from [facility] called in to report that the patient has   1 missed visit this week due to Dr. Visit and patient being  out of town. Will resume PT on next week. If there is any question please call Clair Gulling at  [phone]

## 2020-01-15 ENCOUNTER — Other Ambulatory Visit: Payer: Self-pay | Admitting: Family Medicine

## 2020-01-21 ENCOUNTER — Other Ambulatory Visit: Payer: Self-pay | Admitting: *Deleted

## 2020-01-21 MED ORDER — METOPROLOL SUCCINATE ER 50 MG PO TB24: 100.0000 mg | ORAL_TABLET | Freq: Every day | ORAL | 1 refills | Status: DC

## 2020-01-24 ENCOUNTER — Telehealth: Payer: Self-pay | Admitting: Family Medicine

## 2020-01-24 NOTE — Telephone Encounter (Signed)
[place]   Call Back # [phone]  Per Clair Gulling, patient has a missed appointment this week , Clair Gulling will follow up with patient next week. Per patient's family she is not in the hospital.

## 2020-01-26 DIAGNOSIS — J440 Chronic obstructive pulmonary disease with acute lower respiratory infection: Secondary | ICD-10-CM | POA: Diagnosis not present

## 2020-01-27 NOTE — Telephone Encounter (Signed)
Noted  

## 2020-01-28 ENCOUNTER — Telehealth: Payer: Self-pay

## 2020-01-28 NOTE — Telephone Encounter (Signed)
Sue [name] called in from [facility] called in to let Dr. Etter Sjogren know that the patient has a single visit left for this week.Per the Physical Therapist Sue [name] the  Patient is refusing Physical therapy due to a death in her family. The Patient would like to continue Home Health physical therapy. How ever the required visit is still with in the time period. Patient is Refusing there for [facility] is going to do a Non visit Discharge. The patient is interested  In future physical therapy and it will require a new referral. How ever the patient will need to have improved pain control for lower extremity to make progress. If there are any questions please call Sue [name] Physical therapist at [phone], Faythe Ghee to leave a message.

## 2020-01-28 NOTE — Telephone Encounter (Signed)
FYI

## 2020-01-31 ENCOUNTER — Other Ambulatory Visit: Payer: Self-pay | Admitting: Family

## 2020-01-31 DIAGNOSIS — D649 Anemia, unspecified: Secondary | ICD-10-CM

## 2020-02-03 ENCOUNTER — Ambulatory Visit: Payer: Medicare HMO

## 2020-02-03 ENCOUNTER — Inpatient Hospital Stay: Payer: Medicare HMO | Attending: Family | Admitting: Family

## 2020-02-03 ENCOUNTER — Inpatient Hospital Stay: Payer: Medicare HMO

## 2020-02-11 ENCOUNTER — Other Ambulatory Visit: Payer: Self-pay | Admitting: Family Medicine

## 2020-02-11 DIAGNOSIS — E876 Hypokalemia: Secondary | ICD-10-CM

## 2020-02-14 ENCOUNTER — Other Ambulatory Visit: Payer: Self-pay | Admitting: Family Medicine

## 2020-02-14 DIAGNOSIS — E876 Hypokalemia: Secondary | ICD-10-CM

## 2020-02-26 DIAGNOSIS — J440 Chronic obstructive pulmonary disease with acute lower respiratory infection: Secondary | ICD-10-CM | POA: Diagnosis not present

## 2020-03-05 ENCOUNTER — Other Ambulatory Visit: Payer: Self-pay | Admitting: Family Medicine

## 2020-03-05 DIAGNOSIS — R600 Localized edema: Secondary | ICD-10-CM

## 2020-04-21 ENCOUNTER — Other Ambulatory Visit: Payer: Self-pay | Admitting: Family Medicine

## 2020-05-25 ENCOUNTER — Other Ambulatory Visit: Payer: Self-pay | Admitting: Family Medicine

## 2020-05-28 ENCOUNTER — Ambulatory Visit (HOSPITAL_BASED_OUTPATIENT_CLINIC_OR_DEPARTMENT_OTHER)
Admission: RE | Admit: 2020-05-28 | Discharge: 2020-05-28 | Disposition: A | Discharge status: Home / Self Care | Payer: Medicare HMO | Source: Ambulatory Visit | Attending: Family Medicine | Admitting: Family Medicine

## 2020-05-28 ENCOUNTER — Encounter: Payer: Self-pay | Admitting: Family Medicine

## 2020-05-28 ENCOUNTER — Other Ambulatory Visit: Payer: Self-pay

## 2020-05-28 ENCOUNTER — Ambulatory Visit (INDEPENDENT_AMBULATORY_CARE_PROVIDER_SITE_OTHER): Payer: Medicare HMO | Admitting: Family Medicine

## 2020-05-28 VITALS — BP 130/96 | HR 84 | Temp 99.5°F | Resp 18 | Ht 63.0 in | Wt 292.6 lb

## 2020-05-28 DIAGNOSIS — Z23 Encounter for immunization: Secondary | ICD-10-CM

## 2020-05-28 DIAGNOSIS — E1165 Type 2 diabetes mellitus with hyperglycemia: Secondary | ICD-10-CM | POA: Diagnosis not present

## 2020-05-28 DIAGNOSIS — M25511 Pain in right shoulder: Secondary | ICD-10-CM | POA: Diagnosis not present

## 2020-05-28 DIAGNOSIS — E039 Hypothyroidism, unspecified: Secondary | ICD-10-CM

## 2020-05-28 DIAGNOSIS — M25512 Pain in left shoulder: Secondary | ICD-10-CM | POA: Insufficient documentation

## 2020-05-28 DIAGNOSIS — G8929 Other chronic pain: Secondary | ICD-10-CM

## 2020-05-28 MED ORDER — METHOCARBAMOL 500 MG PO TABS: 500.0000 mg | ORAL_TABLET | Freq: Four times a day (QID) | ORAL | 1 refills | Status: DC

## 2020-05-28 NOTE — Progress Notes (Signed)
Patient ID: Sue [name], female    DOB: Jan 28, 1951  Age: 69 y.o. MRN: [id number]    Subjective:  Subjective   HPI Sue [name] presents for b/l shoulder L >R pain and upper arm  No known injury -- it wakes her up at night and she can not move her arm at times   Review of Systems  Constitutional: Negative for appetite change, diaphoresis, fatigue and unexpected weight change.  Eyes: Negative for pain, redness and visual disturbance.  Respiratory: Negative for cough, chest tightness, shortness of breath and wheezing.   Cardiovascular: Negative for chest pain, palpitations and leg swelling.  Endocrine: Negative for cold intolerance, heat intolerance, polydipsia, polyphagia and polyuria.  Genitourinary: Negative for difficulty urinating, dysuria and frequency.  Neurological: Negative for dizziness, light-headedness, numbness and headaches.    History Past Medical History:  Diagnosis Date  . Allergic rhinitis   . Depression   . Diabetes mellitus type 2, uncontrolled ([place])   . Emphysema of lung ([place])    3L home O2  . GERD (gastroesophageal reflux disease)   . Hypertension   . Hypothyroidism   . Obesity, morbid, BMI 50 or higher ([place])   . Stroke [facility]) 2016   TIA   . Urine incontinence     She has a past surgical history that includes Abdominal hysterectomy; Cesarean section; Colonoscopy with propofol (N/A, 08/05/2019); Colonoscopy (N/A, 08/19/2019); Colonoscopy with propofol (N/A, 11/19/2019); Givens capsule study (N/A, 11/19/2019); Hemostasis clip placement (11/19/2019); Submucosal tattoo injection (11/19/2019); Colonoscopy with propofol (N/A, 11/22/2019); Hemostasis clip placement (11/22/2019); Colonoscopy with propofol (N/A, 11/23/2019); Esophagogastroduodenoscopy (egd) with propofol (N/A, 11/24/2019); Hot hemostasis (N/A, 11/24/2019); enteroscopy (N/A, 11/24/2019); Givens capsule study (N/A, 11/24/2019); Esophagogastroduodenoscopy (N/A, 11/27/2019); enteroscopy (N/A, 11/27/2019); Hot hemostasis  (N/A, 11/27/2019); Givens capsule study (N/A, 11/28/2019); and Colonoscopy with propofol (N/A, 11/29/2019).   Her family history includes Asthma in her son and son; Bipolar disorder in her father; Depression in her father; Diabetes in her sister; Heart disease in her brother, father, paternal aunt, paternal grandmother, and paternal uncle; Heart disease (age of onset: 60) in her sister; Hyperlipidemia in her sister; Hypertension in her brother, father, and sister; Schizophrenia in her paternal aunt.She reports that she quit smoking about 8 years ago. Her smoking use included cigarettes. She started smoking about 47 years ago. She has a 40.00 pack-year smoking history. She has never used smokeless tobacco. She reports previous alcohol use. She reports that she does not use drugs.  Current Outpatient Medications on File Prior to Visit  Medication Sig Dispense Refill  . Accu-Chek FastClix Lancets MISC USE TO CHECK BLOOD SUGAR UP TO FOUR TIMES DAILY AS DIRECTED (Patient taking differently: See admin instructions. Up to 4 times a day) 306 each 0  . ACCU-CHEK GUIDE test strip USE TO CHECK BLOOD SUGAR UP TO 4 TIMES DAILY 100 each 0  . albuterol (PROVENTIL HFA;VENTOLIN HFA) 108 (90 Base) MCG/ACT inhaler Inhale 1-2 puffs into the lungs every 6 (six) hours as needed for wheezing or shortness of breath.    Marland Kitchen albuterol (PROVENTIL) (2.5 MG/3ML) 0.083% nebulizer solution Take 3 mLs (2.5 mg total) by nebulization every 6 (six) hours as needed for wheezing or shortness of breath. 150 mL 1  . blood glucose meter kit and supplies KIT Dispense based on patient and insurance preference. Use up to four times daily as directed. (FOR ICD-9 250.00, 250.01). 1 each 0  . busPIRone (BUSPAR) 15 MG tablet Take 15 mg by mouth at bedtime.   4  .  famotidine (PEPCID) 20 MG tablet Take 20 mg by mouth daily as needed for heartburn or indigestion.    . fluocinonide-emollient (LIDEX-E) 0.05 % cream Apply 1 application topically 2 (two) times  daily. (Patient taking differently: Apply 1 application topically See admin instructions. Apply to rash on buttocks twice a day) 30 g 2  . fluticasone (FLONASE) 50 MCG/ACT nasal spray Place 2 sprays into both nostrils daily as needed for allergies.    Marland Kitchen glipiZIDE (GLUCOTROL) 5 MG tablet TAKE 1 TABLET BY MOUTH TWICE DAILY BEFORE A MEAL 60 tablet 0  . insulin glargine (LANTUS) 100 UNIT/ML injection INJECT 30 UNITS SUBCUTANEOUSLY AT BEDTIME 10 mL 0  . Insulin Syringe-Needle U-100 28G X 5/16" 0.5 ML MISC Use with lantus once a day 100 each 1  . levothyroxine (SYNTHROID) 75 MCG tablet Take 1 tablet (75 mcg total) by mouth daily. 90 tablet 1  . lisinopril (ZESTRIL) 20 MG tablet Take 1 tablet by mouth once daily (Patient taking differently: Take 20 mg by mouth daily. ) 90 tablet 0  . LORazepam (ATIVAN) 1 MG tablet Take 0.5-1.5 mg by mouth See admin instructions. Take 1-1.5 mg by mouth at bedtime and an additional 0.5 mg once a day as needed for anxiety    . Magnesium 400 MG CAPS Take 1 capsule by mouth daily. 30 capsule 0  . metFORMIN (GLUCOPHAGE) 500 MG tablet TAKE 2 TABLETS BY MOUTH TWICE DAILY WITH A  MEAL 120 tablet 0  . metoprolol succinate (TOPROL-XL) 50 MG 24 hr tablet Take 2 tablets (100 mg total) by mouth daily after supper. Take with or immediately following a meal. 180 tablet 1  . NONFORMULARY OR COMPOUNDED ITEM Pt/ inr   Dx dvt   Tomorrow 04/05/2019  Please call office [phone] with results 1 each 0  . NONFORMULARY OR COMPOUNDED ITEM PT/ inr    Dx dvt 1 each 0  . NONFORMULARY OR COMPOUNDED ITEM Compression stockings  20-30 mm/hg  #1   Dx low ext edema 1 each 0  . OXYGEN Inhale 3-4 L/min into the lungs continuous.     . potassium chloride SA (KLOR-CON) 20 MEQ tablet TAKE 1  BY MOUTH ONCE DAILY 90 tablet 3  . QUEtiapine (SEROQUEL) 100 MG tablet Take 150 mg by mouth at bedtime.     . torsemide (DEMADEX) 20 MG tablet Take 2 tablets (40 mg total) by mouth every evening. 180 tablet 1  . traMADol  (ULTRAM) 50 MG tablet Take 1 tablet (50 mg total) by mouth every 6 (six) hours as needed. 15 tablet 0  . zolpidem (AMBIEN) 10 MG tablet Take 10 mg by mouth at bedtime.     No current facility-administered medications on file prior to visit.     Objective:  Objective  Physical Exam Vitals and nursing note reviewed.  Constitutional:      Appearance: She is well-developed.  HENT:     Head: Normocephalic and atraumatic.  Eyes:     Conjunctiva/sclera: Conjunctivae normal.  Neck:     Thyroid: No thyromegaly.     Vascular: No carotid bruit or JVD.  Cardiovascular:     Rate and Rhythm: Normal rate and regular rhythm.     Heart sounds: Normal heart sounds. No murmur heard.   Pulmonary:     Effort: Pulmonary effort is normal. No respiratory distress.     Breath sounds: Normal breath sounds. No wheezing or rales.  Chest:     Chest wall: No tenderness.  Musculoskeletal:  General: Tenderness present.     Right upper arm: Tenderness present. No swelling or edema.     Left upper arm: Tenderness present. No swelling or edema.       Arms:     Cervical back: Normal range of motion and neck supple.  Neurological:     Mental Status: She is alert and oriented to person, place, and time.    BP (!) 130/96 (BP Location: Right Arm, Patient Position: Sitting, Cuff Size: Large)   Pulse 84   Temp 99.5 F (37.5 C) (Oral)   Resp 18   Ht _0  (1.6 m)   Wt 292 lb 9.6 oz (132.7 kg)   SpO2 97%   BMI 51.83 kg/m  Wt Readings from Last 3 Encounters:  05/28/20 292 lb 9.6 oz (132.7 kg)  01/07/20 286 lb (129.7 kg)  01/01/20 284 lb 6.3 oz (129 kg)     Lab Results  Component Value Date   WBC 7.2 01/07/2020   HGB 9.3 (L) 01/07/2020   HCT 28.9 (L) 01/07/2020   PLT 327.0 01/07/2020   GLUCOSE 156 (H) 05/28/2020   CHOL 146 05/28/2020   TRIG 82 05/28/2020   HDL 35 (L) 05/28/2020   LDLCALC 94 05/28/2020   ALT 12 05/28/2020   AST 17 05/28/2020   NA 138 05/28/2020   K 3.4 (L) 05/28/2020    CL 96 (L) 05/28/2020   CREATININE 1.27 (H) 05/28/2020   BUN 13 05/28/2020   CO2 35 (H) 05/28/2020   TSH 4.58 (H) 05/28/2020   INR 1.2 01/01/2020   HGBA1C 8.6 (H) 05/28/2020    CT Head Wo Contrast  Result Date: 01/01/2020 CLINICAL DATA:  [facility], [facility] states they were called out for slurred speech that began approx 1 hour after pt took zanflex that she was prescribed yesterday for leg pain from a previous injury- has been taking it on an empty stomach, per her daughter EXAM: CT HEAD WITHOUT CONTRAST TECHNIQUE: Contiguous axial images were obtained from the base of the skull through the vertex without intravenous contrast. COMPARISON:  12/15/2015 FINDINGS: Brain: No evidence of acute infarction, hemorrhage, hydrocephalus, extra-axial collection or mass lesion/mass effect. Minor periventricular white matter hypoattenuation is noted consistent with chronic microvascular ischemic change. Vascular: No hyperdense vessel or unexpected calcification. Skull: Normal. Negative for fracture or focal lesion. Sinuses/Orbits: Old medial left orbital wall fracture. Globes and orbits are otherwise unremarkable. Right maxillary sinus is mostly opacified. Remaining sinuses are clear. Other: None. IMPRESSION: 1. No acute intracranial abnormalities. 2. Minor chronic microvascular ischemic change. 3. Chronic appearing near complete opacification of the right maxillary sinus. Electronically Signed   By: Lajean Manes M.D.   On: 01/01/2020 12:23   MR BRAIN WO CONTRAST  Result Date: 01/01/2020 CLINICAL DATA:  TIA. Transient left facial numbness and slurred speech. EXAM: MRI HEAD WITHOUT CONTRAST TECHNIQUE: Multiplanar, multiecho pulse sequences of the brain and surrounding structures were obtained without intravenous contrast. COMPARISON:  Head CT 01/01/2020 and MRI 07/30/2014 FINDINGS: Brain: There is no evidence of acute infarct, intracranial hemorrhage, mass, midline shift, or extra-axial fluid collection. The  ventricles and sulci are normal. T2 hyperintensities in the cerebral white matter bilaterally are unchanged from the prior MRI and nonspecific but compatible with minimal chronic small vessel ischemic disease. Vascular: Major intracranial vascular flow voids are preserved. Skull and upper cervical spine: Unremarkable bone marrow signal. Sinuses/Orbits: Remote medial left orbital fracture. Chronic right sphenoid sinusitis. Clear mastoid air cells. Other: None. IMPRESSION: 1. No  acute intracranial abnormality. 2. Minimal chronic small vessel ischemic disease. Electronically Signed   By: Logan Bores M.D.   On: 01/01/2020 16:57   DG Chest Portable 1 View  Result Date: 01/01/2020 CLINICAL DATA:  Shortness of breath. EXAM: PORTABLE CHEST 1 VIEW COMPARISON:  November 27, 2019. FINDINGS: The heart size and mediastinal contours are within normal limits. Both lungs are clear. No pneumothorax or pleural effusion is noted. The visualized skeletal structures are unremarkable. IMPRESSION: No active disease. Electronically Signed   By: Marijo Conception M.D.   On: 01/01/2020 10:57     Assessment & Plan:  Plan  I am having Hollyann E. Nissley start on methocarbamol. I am also having her maintain her OXYGEN, albuterol, albuterol, fluticasone, busPIRone, blood glucose meter kit and supplies, Insulin Syringe-Needle U-100, QUEtiapine, famotidine, LORazepam, NONFORMULARY OR COMPOUNDED ITEM, NONFORMULARY OR COMPOUNDED ITEM, lisinopril, NONFORMULARY OR COMPOUNDED ITEM, Accu-Chek FastClix Lancets, zolpidem, fluocinonide-emollient, levothyroxine, traMADol, Magnesium, insulin glargine, metoprolol succinate, potassium chloride SA, torsemide, metFORMIN, Accu-Chek Guide, and glipiZIDE.  Meds ordered this encounter  Medications  . methocarbamol (ROBAXIN) 500 MG tablet    Sig: Take 1 tablet (500 mg total) by mouth 4 (four) times daily.    Dispense:  45 tablet    Refill:  1    Problem List Items Addressed This Visit      Unprioritized    Chronic pain of both shoulders - Primary    L > R  Check xray L shoulder  Consider ortho/ sport med if pain persists Ice/ heat  Muscle relaxer       Relevant Medications   methocarbamol (ROBAXIN) 500 MG tablet   Other Relevant Orders   DG Shoulder Left (Completed)   Comprehensive metabolic panel (Completed)   Hemoglobin A1c (Completed)   PTH, intact (no Ca)   Lipid panel (Completed)   Diabetes mellitus type II, uncontrolled ([facility]) (Chronic)    hgba1c to be checked, minimize simple carbs. Increase exercise as tolerated. Continue current meds       Relevant Orders   Hemoglobin A1c (Completed)   Hypothyroidism   Relevant Orders   TSH (Completed)    Other Visit Diagnoses    Hypercalcemia       Relevant Orders   Comprehensive metabolic panel (Completed)   PTH, intact (no Ca)   Need for influenza vaccination       Relevant Orders   Flu Vaccine QUAD High Dose(Fluad) (Completed)   Need for pneumococcal vaccination       Relevant Orders   Pneumococcal polysaccharide vaccine 23-valent greater than or equal to 2yo subcutaneous/IM (Completed)      Follow-up: Return in about 3 months (around 08/27/2020), or if symptoms worsen or fail to improve, for annual exam, fasting.  Ann Held, DO

## 2020-05-28 NOTE — Patient Instructions (Signed)
Shoulder Pain Many things can cause shoulder pain, including:  An injury to the shoulder.  Overuse of the shoulder.  Arthritis. The source of the pain can be:  Inflammation.  An injury to the shoulder joint.  An injury to a tendon, ligament, or bone. Follow these instructions at home: Pay attention to changes in your symptoms. Let your health care provider know about them. Follow these instructions to relieve your pain. If you have a sling:  Wear the sling as told by your health care provider. Remove it only as told by your health care provider.  Loosen the sling if your fingers tingle, become numb, or turn cold and blue.  Keep the sling clean.  If the sling is not waterproof: ? Do not let it get wet. Remove it to shower or bathe.  Move your arm as little as possible, but keep your hand moving to prevent swelling. Managing pain, stiffness, and swelling   If directed, put ice on the painful area: ? Put ice in a plastic bag. ? Place a towel between your skin and the bag. ? Leave the ice on for 20 minutes, 2-3 times per day. Stop applying ice if it does not help with the pain.  Squeeze a soft ball or a foam pad as much as possible. This helps to keep the shoulder from swelling. It also helps to strengthen the arm. General instructions  Take over-the-counter and prescription medicines only as told by your health care provider.  Keep all follow-up visits as told by your health care provider. This is important. Contact a health care provider if:  Your pain gets worse.  Your pain is not relieved with medicines.  New pain develops in your arm, hand, or fingers. Get help right away if:  Your arm, hand, or fingers: ? Tingle. ? Become numb. ? Become swollen. ? Become painful. ? Turn white or blue. Summary  Shoulder pain can be caused by an injury, overuse, or arthritis.  Pay attention to changes in your symptoms. Let your health care provider know about  them.  This condition may be treated with a sling, ice, and pain medicines.  Contact your health care provider if the pain gets worse or new pain develops. Get help right away if your arm, hand, or fingers tingle or become numb, swollen, or painful.  Keep all follow-up visits as told by your health care provider. This is important. This information is not intended to replace advice given to you by your health care provider. Make sure you discuss any questions you have with your health care provider. Document Revised: 03/13/2018 Document Reviewed: 03/13/2018 Elsevier Patient Education  2020 [employer]  

## 2020-05-29 DIAGNOSIS — G8929 Other chronic pain: Secondary | ICD-10-CM | POA: Insufficient documentation

## 2020-05-29 LAB — COMPREHENSIVE METABOLIC PANEL
AG Ratio: 0.8 (calc) — ABNORMAL LOW (ref 1.0–2.5)
ALT: 12 U/L (ref 6–29)
AST: 17 U/L (ref 10–35)
Albumin: 3.8 g/dL (ref 3.6–5.1)
Alkaline phosphatase (APISO): 198 U/L — ABNORMAL HIGH (ref 37–153)
BUN/Creatinine Ratio: 10 (calc) (ref 6–22)
BUN: 13 mg/dL (ref 7–25)
CO2: 35 mmol/L — ABNORMAL HIGH (ref 20–32)
Calcium: 8.6 mg/dL (ref 8.6–10.4)
Chloride: 96 mmol/L — ABNORMAL LOW (ref 98–110)
Creat: 1.27 mg/dL — ABNORMAL HIGH (ref 0.50–0.99)
Globulin: 4.6 g/dL (calc) — ABNORMAL HIGH (ref 1.9–3.7)
Glucose, Bld: 156 mg/dL — ABNORMAL HIGH (ref 65–99)
Potassium: 3.4 mmol/L — ABNORMAL LOW (ref 3.5–5.3)
Sodium: 138 mmol/L (ref 135–146)
Total Bilirubin: 0.4 mg/dL (ref 0.2–1.2)
Total Protein: 8.4 g/dL — ABNORMAL HIGH (ref 6.1–8.1)

## 2020-05-29 LAB — LIPID PANEL
Cholesterol: 146 mg/dL (ref <200)
HDL: 35 mg/dL — ABNORMAL LOW (ref >=50)
LDL Cholesterol (Calc): 94 mg/dL (calc)
Non-HDL Cholesterol (Calc): 111 mg/dL (calc) (ref <130)
Total CHOL/HDL Ratio: 4.2 (calc) (ref <5.0)
Triglycerides: 82 mg/dL (ref <150)

## 2020-05-29 LAB — HEMOGLOBIN A1C
Hgb A1c MFr Bld: 8.6 % of total Hgb — ABNORMAL HIGH (ref <5.7)
Mean Plasma Glucose: 200 (calc)
eAG (mmol/L): 11.1 (calc)

## 2020-05-29 LAB — TSH: TSH: 4.58 mIU/L — ABNORMAL HIGH (ref 0.40–4.50)

## 2020-05-29 LAB — PARATHYROID HORMONE, INTACT (NO CA): PTH: 222 pg/mL — ABNORMAL HIGH (ref 14–64)

## 2020-05-29 NOTE — Assessment & Plan Note (Signed)
hgba1c to be checked, minimize simple carbs. Increase exercise as tolerated. Continue current meds  

## 2020-05-29 NOTE — Assessment & Plan Note (Signed)
L > R  Check xray L shoulder  Consider ortho/ sport med if pain persists Ice/ heat  Muscle relaxer

## 2020-05-31 ENCOUNTER — Other Ambulatory Visit: Payer: Self-pay | Admitting: Family Medicine

## 2020-05-31 DIAGNOSIS — E039 Hypothyroidism, unspecified: Secondary | ICD-10-CM

## 2020-05-31 MED ORDER — LEVOTHYROXINE SODIUM 88 MCG PO TABS: 88.0000 ug | ORAL_TABLET | Freq: Every day | ORAL | 3 refills | Status: DC

## 2020-06-02 DIAGNOSIS — K006 Disturbances in tooth eruption: Secondary | ICD-10-CM | POA: Diagnosis not present

## 2020-06-03 ENCOUNTER — Other Ambulatory Visit: Payer: Self-pay

## 2020-06-03 DIAGNOSIS — E039 Hypothyroidism, unspecified: Secondary | ICD-10-CM

## 2020-06-03 DIAGNOSIS — E1165 Type 2 diabetes mellitus with hyperglycemia: Secondary | ICD-10-CM

## 2020-06-16 ENCOUNTER — Ambulatory Visit: Payer: Medicare HMO | Admitting: Internal Medicine

## 2020-06-23 ENCOUNTER — Other Ambulatory Visit: Payer: Self-pay | Admitting: Family Medicine

## 2020-06-29 ENCOUNTER — Other Ambulatory Visit: Payer: Self-pay | Admitting: Family Medicine

## 2020-07-30 DIAGNOSIS — F319 Bipolar disorder, unspecified: Secondary | ICD-10-CM | POA: Diagnosis not present

## 2020-08-17 ENCOUNTER — Other Ambulatory Visit: Payer: Self-pay | Admitting: Family Medicine

## 2020-08-17 NOTE — Telephone Encounter (Signed)
Medication: metoprolol succinate (TOPROL-XL) 50 MG 24 hr tablet    Has the patient contacted their pharmacy? No. (If no, request that the patient contact the pharmacy for the refill.) (If yes, when and what did the pharmacy advise?)  Preferred Pharmacy (with phone number or street name):  [facility] [address]  [place], [place] [address]  Phone:  [phone] Fax:  [phone]  DEA #:  --        Agent: Please be advised that RX refills may take up to 3 business days. We ask that you follow-up with your pharmacy.

## 2020-08-17 NOTE — Telephone Encounter (Signed)
Ok to send med in and glucometer but she needs to go to ER

## 2020-08-17 NOTE — Telephone Encounter (Signed)
Patient calling back stating she needs her medication. She states she is experiencing chest pain and shob. Patient call was send to triage nurse.

## 2020-08-17 NOTE — Telephone Encounter (Signed)
Patient also states she needs a new glucometer send in to the pharmacy. Patient states her's is 68 years old and broken.  Please advise

## 2020-08-18 NOTE — Telephone Encounter (Signed)
Attempted to call patient. No answer. Left VM and advised ED

## 2020-08-27 ENCOUNTER — Other Ambulatory Visit: Payer: Self-pay | Admitting: Family Medicine

## 2020-08-28 ENCOUNTER — Other Ambulatory Visit: Payer: Self-pay | Admitting: Family Medicine

## 2020-09-09 ENCOUNTER — Other Ambulatory Visit: Payer: Self-pay | Admitting: Family Medicine

## 2020-09-09 DIAGNOSIS — R600 Localized edema: Secondary | ICD-10-CM

## 2020-09-11 ENCOUNTER — Other Ambulatory Visit: Payer: Self-pay | Admitting: Family Medicine

## 2020-09-11 DIAGNOSIS — R600 Localized edema: Secondary | ICD-10-CM

## 2020-09-23 ENCOUNTER — Other Ambulatory Visit: Payer: Self-pay | Admitting: Family Medicine

## 2020-11-11 ENCOUNTER — Other Ambulatory Visit: Payer: Self-pay | Admitting: Family Medicine

## 2020-11-30 DIAGNOSIS — F319 Bipolar disorder, unspecified: Secondary | ICD-10-CM | POA: Diagnosis not present

## 2020-12-03 ENCOUNTER — Other Ambulatory Visit: Payer: Self-pay | Admitting: Family Medicine

## 2020-12-08 ENCOUNTER — Other Ambulatory Visit: Payer: Self-pay

## 2020-12-08 ENCOUNTER — Telehealth: Payer: Self-pay | Admitting: Family Medicine

## 2020-12-08 MED ORDER — BLOOD GLUCOSE MONITOR KIT: PACK | 0 refills | Status: DC

## 2020-12-08 NOTE — Telephone Encounter (Signed)
New meter Rx being sent

## 2020-12-08 NOTE — Telephone Encounter (Signed)
Patient states her glucose meter is broken. She would like a prescription for accu check guide.  [facility]  [place], [place] [address]  Phone:  [phone] Fax:  [phone]

## 2020-12-10 DIAGNOSIS — E119 Type 2 diabetes mellitus without complications: Secondary | ICD-10-CM | POA: Diagnosis not present

## 2020-12-10 DIAGNOSIS — H40033 Anatomical narrow angle, bilateral: Secondary | ICD-10-CM | POA: Diagnosis not present

## 2020-12-11 ENCOUNTER — Other Ambulatory Visit: Payer: Self-pay | Admitting: Family Medicine

## 2021-01-20 ENCOUNTER — Other Ambulatory Visit: Payer: Self-pay | Admitting: Family Medicine

## 2021-01-25 ENCOUNTER — Other Ambulatory Visit: Payer: Self-pay | Admitting: Family Medicine

## 2021-01-25 ENCOUNTER — Telehealth: Payer: Self-pay | Admitting: Family Medicine

## 2021-01-25 DIAGNOSIS — R600 Localized edema: Secondary | ICD-10-CM

## 2021-01-25 NOTE — Telephone Encounter (Signed)
Open in error

## 2021-02-01 ENCOUNTER — Ambulatory Visit (INDEPENDENT_AMBULATORY_CARE_PROVIDER_SITE_OTHER): Payer: Medicare HMO | Admitting: Family Medicine

## 2021-02-01 ENCOUNTER — Encounter: Payer: Self-pay | Admitting: Family Medicine

## 2021-02-01 ENCOUNTER — Other Ambulatory Visit: Payer: Self-pay

## 2021-02-01 VITALS — BP 122/90 | HR 91 | Temp 99.3°F | Resp 18 | Ht 63.0 in | Wt 301.0 lb

## 2021-02-01 DIAGNOSIS — J9610 Chronic respiratory failure, unspecified whether with hypoxia or hypercapnia: Secondary | ICD-10-CM | POA: Diagnosis not present

## 2021-02-01 DIAGNOSIS — E1165 Type 2 diabetes mellitus with hyperglycemia: Secondary | ICD-10-CM | POA: Diagnosis not present

## 2021-02-01 DIAGNOSIS — I5032 Chronic diastolic (congestive) heart failure: Secondary | ICD-10-CM

## 2021-02-01 DIAGNOSIS — I10 Essential (primary) hypertension: Secondary | ICD-10-CM

## 2021-02-01 DIAGNOSIS — B182 Chronic viral hepatitis C: Secondary | ICD-10-CM | POA: Diagnosis not present

## 2021-02-01 DIAGNOSIS — J9611 Chronic respiratory failure with hypoxia: Secondary | ICD-10-CM

## 2021-02-01 DIAGNOSIS — E039 Hypothyroidism, unspecified: Secondary | ICD-10-CM

## 2021-02-01 DIAGNOSIS — E785 Hyperlipidemia, unspecified: Secondary | ICD-10-CM

## 2021-02-01 DIAGNOSIS — R002 Palpitations: Secondary | ICD-10-CM | POA: Diagnosis not present

## 2021-02-01 DIAGNOSIS — Z1231 Encounter for screening mammogram for malignant neoplasm of breast: Secondary | ICD-10-CM

## 2021-02-01 DIAGNOSIS — J439 Emphysema, unspecified: Secondary | ICD-10-CM

## 2021-02-01 DIAGNOSIS — E2839 Other primary ovarian failure: Secondary | ICD-10-CM | POA: Diagnosis not present

## 2021-02-01 DIAGNOSIS — E1169 Type 2 diabetes mellitus with other specified complication: Secondary | ICD-10-CM

## 2021-02-01 DIAGNOSIS — I824Y2 Acute embolism and thrombosis of unspecified deep veins of left proximal lower extremity: Secondary | ICD-10-CM

## 2021-02-01 DIAGNOSIS — M25562 Pain in left knee: Secondary | ICD-10-CM | POA: Diagnosis not present

## 2021-02-01 LAB — COMPREHENSIVE METABOLIC PANEL
ALT: 11 U/L (ref 0–35)
AST: 19 U/L (ref 0–37)
Albumin: 3.7 g/dL (ref 3.5–5.2)
Alkaline Phosphatase: 169 U/L — ABNORMAL HIGH (ref 39–117)
BUN: 16 mg/dL (ref 6–23)
CO2: 31 mEq/L (ref 19–32)
Calcium: 9.2 mg/dL (ref 8.4–10.5)
Chloride: 100 mEq/L (ref 96–112)
Creatinine, Ser: 1.55 mg/dL — ABNORMAL HIGH (ref 0.40–1.20)
GFR: 34.21 mL/min — ABNORMAL LOW (ref >60.00)
Glucose, Bld: 155 mg/dL — ABNORMAL HIGH (ref 70–99)
Potassium: 4.2 mEq/L (ref 3.5–5.1)
Sodium: 137 mEq/L (ref 135–145)
Total Bilirubin: 0.4 mg/dL (ref 0.2–1.2)
Total Protein: 8 g/dL (ref 6.0–8.3)

## 2021-02-01 LAB — LIPID PANEL
Cholesterol: 132 mg/dL (ref 0–200)
HDL: 33.7 mg/dL — ABNORMAL LOW (ref >39.00)
LDL Cholesterol: 74 mg/dL (ref 0–99)
NonHDL: 98.48
Total CHOL/HDL Ratio: 4
Triglycerides: 121 mg/dL (ref 0.0–149.0)
VLDL: 24.2 mg/dL (ref 0.0–40.0)

## 2021-02-01 LAB — HEMOGLOBIN A1C: Hgb A1c MFr Bld: 7.7 % — ABNORMAL HIGH (ref 4.6–6.5)

## 2021-02-01 LAB — TSH: TSH: 2.93 u[IU]/mL (ref 0.35–4.50)

## 2021-02-01 NOTE — Patient Instructions (Signed)

## 2021-02-01 NOTE — Assessment & Plan Note (Signed)
pvc on ekg Echo and event monitor

## 2021-02-01 NOTE — Assessment & Plan Note (Signed)
Check labs hgba1c to be checked.  minimize simple carbs. Increase exercise as tolerated. Continue current meds  

## 2021-02-01 NOTE — Assessment & Plan Note (Signed)
Well controlled, no changes to meds. Encouraged heart healthy diet such as the DASH diet and exercise as tolerated.  °

## 2021-02-01 NOTE — Assessment & Plan Note (Addendum)
Check labs May be cause of palpitations

## 2021-02-01 NOTE — Assessment & Plan Note (Signed)
Tolerating statin, encouraged heart healthy diet, avoid trans fats, minimize simple carbs and saturated fats. Increase exercise as tolerated 

## 2021-02-01 NOTE — Progress Notes (Signed)
Patient ID: Sue [name], female    DOB: [birth year]  Age: 70 y.o. MRN: [id number]    Subjective:  Subjective  HPI ROCKELLE HEUERMAN presents for an office visit today. She complains of palpitations. She notes that it last for 15-30 minutes, 1-2 per week. She states that the last time it occurred was a week ago. She describes the papillations as a trigeminy. She also complains of weakness and pain in the left LE x3 weeks. She notes that when she stood up, she heard a "click" sound H7-2 weeks. She states that it worsen when she puts weights on it. She endorses seeing sport medicine x2 years, after a left tibia fracture on 02/24/2019. She denies any chest pain, SOB, fever, abdominal pain, cough, chills, sore throat, dysuria, urinary incontinence, back pain, HA, or N/V/D at this time. Her at home blood sugar level is 100-200 depending on when and what she ate.  Lab Results  Component Value Date   HGBA1C 7.7 (H) 02/01/2021  Pt requests for a shingles vaccination. Pt is planning on getting a mammogram.   Review of Systems  Constitutional: Negative for chills, fatigue and fever.  HENT: Negative for ear pain, rhinorrhea, sinus pressure, sinus pain, sore throat and tinnitus.   Eyes: Negative for pain.  Respiratory: Negative for cough, shortness of breath and wheezing.   Cardiovascular: Positive for palpitations. Negative for chest pain.  Gastrointestinal: Negative for abdominal pain, anal bleeding, constipation, diarrhea, nausea and vomiting.  Genitourinary: Negative for flank pain.  Musculoskeletal: Negative for back pain.       (+) L LE pain    Skin: Negative for rash.  Neurological: Positive for weakness (L LE). Negative for seizures, light-headedness, numbness and headaches.  ekg--sinus with pvcs -----  pvcs are new since last ekg 12/2019  History Past Medical History:  Diagnosis Date  . Allergic rhinitis   . Depression   . Diabetes mellitus type 2, uncontrolled ([place])   . Emphysema of lung  ([place])    3L home O2  . GERD (gastroesophageal reflux disease)   . Hypertension   . Hypothyroidism   . Obesity, morbid, BMI 50 or higher ([place])   . Stroke [facility]) 2016   TIA   . Urine incontinence     She has a past surgical history that includes Abdominal hysterectomy; Cesarean section; Colonoscopy with propofol (N/A, 08/05/2019); Colonoscopy (N/A, 08/19/2019); Colonoscopy with propofol (N/A, 11/19/2019); Givens capsule study (N/A, 11/19/2019); Hemostasis clip placement (11/19/2019); Submucosal tattoo injection (11/19/2019); Colonoscopy with propofol (N/A, 11/22/2019); Hemostasis clip placement (11/22/2019); Colonoscopy with propofol (N/A, 11/23/2019); Esophagogastroduodenoscopy (egd) with propofol (N/A, 11/24/2019); Hot hemostasis (N/A, 11/24/2019); enteroscopy (N/A, 11/24/2019); Givens capsule study (N/A, 11/24/2019); Esophagogastroduodenoscopy (N/A, 11/27/2019); enteroscopy (N/A, 11/27/2019); Hot hemostasis (N/A, 11/27/2019); Givens capsule study (N/A, 11/28/2019); and Colonoscopy with propofol (N/A, 11/29/2019).   Her family history includes Asthma in her son and son; Bipolar disorder in her father; Depression in her father; Diabetes in her sister; Heart disease in her brother, father, paternal aunt, paternal grandmother, and paternal uncle; Heart disease (age of onset: 71) in her sister; Hyperlipidemia in her sister; Hypertension in her brother, father, and sister; Schizophrenia in her paternal aunt.She reports that she quit smoking about 9 years ago. Her smoking use included cigarettes. She started smoking about 48 years ago. She has a 40.00 pack-year smoking history. She has never used smokeless tobacco. She reports previous alcohol use. She reports that she does not use drugs.  Current Outpatient Medications on File Prior to Visit  Medication Sig Dispense Refill  . Accu-Chek FastClix Lancets MISC USE TO CHECK BLOOD SUGAR UP TO FOUR TIMES DAILY AS DIRECTED (Patient taking differently: See admin instructions. Up to 4  times a day) 306 each 0  . ACCU-CHEK GUIDE test strip USE 1 STRIP TO CHECK GLUCOSE UP TO 4 TIMES DAILY 100 each 0  . albuterol (PROVENTIL HFA;VENTOLIN HFA) 108 (90 Base) MCG/ACT inhaler Inhale 1-2 puffs into the lungs every 6 (six) hours as needed for wheezing or shortness of breath.    Marland Kitchen albuterol (PROVENTIL) (2.5 MG/3ML) 0.083% nebulizer solution Take 3 mLs (2.5 mg total) by nebulization every 6 (six) hours as needed for wheezing or shortness of breath. 150 mL 1  . blood glucose meter kit and supplies KIT Dispense based on patient and insurance preference. Use up to four times daily as directed. (FOR ICD-9 250.00, 250.01). 1 each 0  . busPIRone (BUSPAR) 15 MG tablet Take 15 mg by mouth at bedtime.   4  . famotidine (PEPCID) 20 MG tablet Take 20 mg by mouth daily as needed for heartburn or indigestion.    . fluocinonide-emollient (LIDEX-E) 0.05 % cream Apply 1 application topically 2 (two) times daily. (Patient taking differently: Apply 1 application topically See admin instructions. Apply to rash on buttocks twice a day) 30 g 2  . fluticasone (FLONASE) 50 MCG/ACT nasal spray Place 2 sprays into both nostrils daily as needed for allergies.    Marland Kitchen glipiZIDE (GLUCOTROL) 5 MG tablet TAKE 1 TABLET BY MOUTH TWICE DAILY BEFORE A MEAL 60 tablet 0  . Insulin Syringe-Needle U-100 28G X 5/16" 0.5 ML MISC Use with lantus once a day 100 each 1  . LANTUS 100 UNIT/ML injection INJECT 30 UNITS SUBCUTANEOUSLY AT BEDTIME 30 mL 0  . levothyroxine (SYNTHROID) 75 MCG tablet Take 1 tablet (75 mcg total) by mouth daily. 90 tablet 1  . levothyroxine (SYNTHROID) 88 MCG tablet Take 1 tablet (88 mcg total) by mouth daily. 90 tablet 3  . lisinopril (ZESTRIL) 20 MG tablet Take 1 tablet by mouth once daily (Patient taking differently: Take 20 mg by mouth daily.) 90 tablet 0  . LORazepam (ATIVAN) 1 MG tablet Take 0.5-1.5 mg by mouth See admin instructions. Take 1-1.5 mg by mouth at bedtime and an additional 0.5 mg once a day as  needed for anxiety    . Magnesium 400 MG CAPS Take 1 capsule by mouth daily. 30 capsule 0  . metFORMIN (GLUCOPHAGE) 500 MG tablet TAKE 2 TABLETS BY MOUTH TWICE DAILY WITH A  MEAL 120 tablet 3  . methocarbamol (ROBAXIN) 500 MG tablet Take 1 tablet (500 mg total) by mouth 4 (four) times daily. 45 tablet 1  . metoprolol succinate (TOPROL-XL) 50 MG 24 hr tablet Take 2 tablets (100 mg total) by mouth daily. Take with or immediately following a meal. 180 tablet 1  . NONFORMULARY OR COMPOUNDED ITEM Pt/ inr   Dx dvt   Tomorrow 04/05/2019  Please call office [phone] with results 1 each 0  . NONFORMULARY OR COMPOUNDED ITEM PT/ inr    Dx dvt 1 each 0  . NONFORMULARY OR COMPOUNDED ITEM Compression stockings  20-30 mm/hg  #1   Dx low ext edema 1 each 0  . OXYGEN Inhale 3-4 L/min into the lungs continuous.     . potassium chloride SA (KLOR-CON) 20 MEQ tablet TAKE 1  BY MOUTH ONCE DAILY 90 tablet 3  . QUEtiapine (SEROQUEL) 100 MG tablet Take 150 mg by mouth at bedtime.     Marland Kitchen  torsemide (DEMADEX) 20 MG tablet Take 2 tablets (40 mg total) by mouth daily. 60 tablet 1  . traMADol (ULTRAM) 50 MG tablet Take 1 tablet (50 mg total) by mouth every 6 (six) hours as needed. 15 tablet 0  . zolpidem (AMBIEN) 10 MG tablet Take 10 mg by mouth at bedtime.     No current facility-administered medications on file prior to visit.     Objective:  Objective  Physical Exam Vitals and nursing note reviewed.  Constitutional:      General: She is not in acute distress.    Appearance: Normal appearance. She is well-developed. She is not ill-appearing.  HENT:     Head: Normocephalic and atraumatic.     Right Ear: External ear normal.     Left Ear: External ear normal.     Nose: Nose normal.  Eyes:     General:        Right eye: No discharge.        Left eye: No discharge.     Extraocular Movements: Extraocular movements intact.     Pupils: Pupils are equal, round, and reactive to light.  Cardiovascular:     Rate and  Rhythm: Normal rate and regular rhythm.     Pulses: Normal pulses.     Heart sounds: Normal heart sounds. No murmur heard. No friction rub. No gallop.   Pulmonary:     Effort: Pulmonary effort is normal. No respiratory distress.     Breath sounds: Normal breath sounds. No stridor. No wheezing, rhonchi or rales.  Chest:     Chest wall: No tenderness.  Abdominal:     General: Bowel sounds are normal. There is no distension.     Palpations: Abdomen is soft. There is no mass.     Tenderness: There is no abdominal tenderness. There is no guarding or rebound.     Hernia: No hernia is present.  Musculoskeletal:        General: Normal range of motion.     Cervical back: Normal range of motion and neck supple.     Right lower leg: No edema.     Left lower leg: No edema.  Skin:    General: Skin is warm and dry.  Neurological:     Mental Status: She is alert and oriented to person, place, and time.  Psychiatric:        Behavior: Behavior normal.        Thought Content: Thought content normal.    BP 122/90 (BP Location: Right Arm, Patient Position: Sitting, Cuff Size: Large)   Pulse 91   Temp 99.3 F (37.4 C) (Oral)   Resp 18   Ht _0  (1.6 m)   Wt (!) 301 lb (136.5 kg)   SpO2 91%   BMI 53.32 kg/m  Wt Readings from Last 3 Encounters:  02/01/21 (!) 301 lb (136.5 kg)  05/28/20 292 lb 9.6 oz (132.7 kg)  01/07/20 286 lb (129.7 kg)     Lab Results  Component Value Date   WBC 7.2 01/07/2020   HGB 9.3 (L) 01/07/2020   HCT 28.9 (L) 01/07/2020   PLT 327.0 01/07/2020   GLUCOSE 155 (H) 02/01/2021   CHOL 132 02/01/2021   TRIG 121.0 02/01/2021   HDL 33.70 (L) 02/01/2021   LDLCALC 74 02/01/2021   ALT 11 02/01/2021   AST 19 02/01/2021   NA 137 02/01/2021   K 4.2 02/01/2021   CL 100 02/01/2021   CREATININE 1.55 (H) 02/01/2021  BUN 16 02/01/2021   CO2 31 02/01/2021   TSH 2.93 02/01/2021   INR 1.2 01/01/2020   HGBA1C 7.7 (H) 02/01/2021    DG Shoulder Left  Result Date:  05/29/2020 CLINICAL DATA:  Chronic left shoulder pain. EXAM: LEFT SHOULDER - 2+ VIEW COMPARISON:  None. FINDINGS: There is no evidence of fracture or dislocation. There is no evidence of arthropathy or other focal bone abnormality. Soft tissues are unremarkable. IMPRESSION: Negative. Electronically Signed   By: Marijo Conception M.D.   On: 05/29/2020 08:19     Assessment & Plan:  Plan    No orders of the defined types were placed in this encounter.   Problem List Items Addressed This Visit      Unprioritized   Chronic diastolic heart failure ([place])   Chronic hepatitis C without hepatic coma (HCC)   Chronic respiratory failure (HCC) (Chronic)   Relevant Orders   Ambulatory referral to Pulmonology   Diabetes mellitus type II, uncontrolled ([place]) (Chronic)    Check labs  hgba1c to be checked minimize simple carbs. Increase exercise as tolerated. Continue current meds       HTN (hypertension) (Chronic)    Well controlled, no changes to meds. Encouraged heart healthy diet such as the DASH diet and exercise as tolerated.       Relevant Orders   Lipid panel (Completed)   Comprehensive metabolic panel (Completed)   TSH (Completed)   Hyperlipidemia    Encouraged heart healthy diet, increase exercise, avoid trans fats, consider a krill oil cap daily      Relevant Orders   Lipid panel (Completed)   Comprehensive metabolic panel (Completed)   TSH (Completed)   Hyperlipidemia associated with type 2 diabetes mellitus ([place])    Tolerating statin, encouraged heart healthy diet, avoid trans fats, minimize simple carbs and saturated fats. Increase exercise as tolerated      Hypothyroidism - Primary    Check labs May be cause of palpitations       Relevant Orders   Comprehensive metabolic panel (Completed)   TSH (Completed)   Left knee pain    Worsening Refer to sport med      Relevant Orders   Ambulatory referral to Sports Medicine   Morbid obesity due to excess calories (HCC)  (Chronic)   Palpitations    pvc on ekg Echo and event monitor       Relevant Orders   EKG 12-Lead (Completed)   Cardiac event monitor   Pulmonary emphysema ([place])    Other Visit Diagnoses    Type 2 diabetes mellitus with hyperglycemia, without long-term current use of insulin (HCC)       Relevant Orders   Hemoglobin A1c (Completed)   Estrogen deficiency       Relevant Orders   DG Bone Density   Encounter for screening mammogram for malignant neoplasm of breast       Relevant Orders   MM DIGITAL SCREENING BILATERAL   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   I,Gordon Zheng,acting as a scribe for [employer], DO.,have documented all relevant documentation on the behalf of Ann Held, DO,as directed by  Ann Held, DO while in the presence of Ann Held, DO.  I,  Ann Held, DO, have reviewed all documentation for this visit. The documentation on 02/01/21 for the exam, diagnosis, procedures, and orders are all accurate and complete.

## 2021-02-01 NOTE — Assessment & Plan Note (Signed)
Encouraged heart healthy diet, increase exercise, avoid trans fats, consider a krill oil cap daily 

## 2021-02-01 NOTE — Assessment & Plan Note (Signed)
Worsening Refer to sport med

## 2021-02-03 ENCOUNTER — Other Ambulatory Visit: Payer: Self-pay

## 2021-02-03 DIAGNOSIS — N1832 Chronic kidney disease, stage 3b: Secondary | ICD-10-CM

## 2021-02-09 NOTE — Progress Notes (Deleted)
   I, Peterson Lombard, LAT, ATC acting as a scribe for Lynne Leader, MD.  Subjective:    I'm seeing this patient as a consultation for: Dr. Roma Schanz. Note will be routed back to referring provider/PCP.  CC: Left knee pain  HPI: Pt is a 70 y/o female c/o L knee pain x 1 month. Pt has a hx of a L tibial plateau and proximal fibula fx in 2020. Pt locates pain to   L knee swelling: Mechanical symptoms: yes LE Weakness: Aggravates: bearing weight Treatments tried:  Dx imaging: 02/25/19 L knee MRI  02/24/19 L Tib/fib & L knee XR  02/14/17 L knee XR  Past medical history, Surgical history, Family history, Social history, Allergies, and medications have been entered into the medical record, reviewed. ***  Review of Systems: No new headache, visual changes, nausea, vomiting, diarrhea, constipation, dizziness, abdominal pain, skin rash, fevers, chills, night sweats, weight loss, swollen lymph nodes, body aches, joint swelling, muscle aches, chest pain, shortness of breath, mood changes, visual or auditory hallucinations.   Objective:   There were no vitals filed for this visit. General: Well Developed, well nourished, and in no acute distress.  Neuro/Psych: Alert and oriented x3, extra-ocular muscles intact, able to move all 4 extremities, sensation grossly intact. Skin: Warm and dry, no rashes noted.  Respiratory: Not using accessory muscles, speaking in full sentences, trachea midline.  Cardiovascular: Pulses palpable, no extremity edema. Abdomen: Does not appear distended. MSK: ***  Lab and Radiology Results No results found for this or any previous visit (from the past 72 hour(s)). No results found.  Impression and Recommendations:    Assessment and Plan: 70 y.o. female with ***.  PDMP not reviewed this encounter. No orders of the defined types were placed in this encounter.  No orders of the defined types were placed in this encounter.   Discussed warning signs or  symptoms. Please see discharge instructions. Patient expresses understanding.   ***

## 2021-02-10 ENCOUNTER — Ambulatory Visit: Payer: Medicare HMO | Admitting: Family Medicine

## 2021-02-19 ENCOUNTER — Other Ambulatory Visit: Payer: Self-pay

## 2021-02-19 ENCOUNTER — Ambulatory Visit (INDEPENDENT_AMBULATORY_CARE_PROVIDER_SITE_OTHER): Payer: Medicare HMO | Admitting: Internal Medicine

## 2021-02-19 ENCOUNTER — Encounter: Payer: Self-pay | Admitting: Internal Medicine

## 2021-02-19 DIAGNOSIS — R0609 Other forms of dyspnea: Secondary | ICD-10-CM

## 2021-02-19 DIAGNOSIS — I10 Essential (primary) hypertension: Secondary | ICD-10-CM

## 2021-02-19 DIAGNOSIS — R0600 Dyspnea, unspecified: Secondary | ICD-10-CM | POA: Diagnosis not present

## 2021-02-19 DIAGNOSIS — J9611 Chronic respiratory failure with hypoxia: Secondary | ICD-10-CM

## 2021-02-19 MED ORDER — OLMESARTAN MEDOXOMIL 20 MG PO TABS: 20.0000 mg | ORAL_TABLET | Freq: Every day | ORAL | 2 refills | Status: DC

## 2021-02-19 NOTE — Patient Instructions (Addendum)
Stop the lisinopril '20mg'$  and replace it with olmesartan 20 mg one day and the cough/ drainage should fade away over the next several weeks  - if not return here.   Follow up here is yearly for your 02

## 2021-02-19 NOTE — Progress Notes (Signed)
Subjective:    Patient ID: Sue [name], female    DOB: 1951/05/19  MRN: [id number]    Brief patient profile:  68 yobf quit smoking 2013 when  dx with copd/emphysema by [facility] doctor with doe on exertion at that point at wt 180 and steady wt gain of about 100lb assoc with increased doe x room to room and difficulty with housework then admitted with acute resp failure/ acute renal failure on acei with pfts c/w GOLD 0 criteria (no obstruction )        History of Present Illness  10/23/2013 1st [facility] office visit/ Sue [name] cc chronic orthopnea x sev years and doe and now on 02 since d/c above still gets let off at curb on dulera bid and duoneb qid s much variability, assoc with min daytime dry cough better off acei, still some HB symptoms rec dulera 200 Take 2 puffs first thing in am and then another 2 puffs about 12 hours later.  Only use the duoneb if you need it to catch your breath up to 4 x daily  In the meantime wear the 02 24/7 and especially with exercise a minimum of 30 min per day   11/12/2013 f/u ov/Sue [name] re: ? Copd/ not able to take [place]  Patient presents with   Follow-up    Breathing unchanged since last OV-- (R) lower side pain (abdominal), now going into left side   no change sob off on dulera vs off New back pain first rad to R flank now left > CTa neg 11/06/13 and rx as uti with cipro, no better rec Ibuprofen up to 800 mg with meals as needed for pain If not better > tramadol 50 up to every 4 hours as needed if ibuprofen not helping Use incentive spirometer as much as possible   11/29/2013 f/u ov/Sue [name] re: chronic resp failure / GOLD 0  Chief Complaint  Patient presents with   Follow-up    Breathing has improved since last OV   Not using pain meds / rare need for duoneb Baseline wt 180 but unable to lie flat x 2013 rec Keep on working on weight loss and no change on 02 recs> the goal is to keep it at 90% or better at all times but as you  lose weight it may be possible to use less 02 Pulmonary follow up is as needed      05/09/2018  f/u ov/Sue [name] re: GOLD 0 copd/ obesity ? Osa?  Chief Complaint  Patient presents with   Follow-up    Former Dr Ashok Cordia pt.  She states her breathing is unchanged since her last visit here. She rarely uses her albuterol inhaler and neb.  Dyspnea:  No change/ MMRC3 = can't walk 100 yards even at a slow pace at a flat grade s stopping due to sob  On 3lpm  No better on spiriva so doesn't use it  Cough: none Sleeping: 3-4 pillows on 3 lpm SABA use: none / no better with saba  02 :  3lpm   Has ex bike 2 x weekly x 30 min each  Sleepy during the day / feels ok in am but by afternoon feels drowsy but never during important activities and not  While driving. epworth = 8 Rec Weight control is simply a matter of calorie balance   Please see patient coordinator before you leave today  to schedule split night study to see if you need cpap and we will  arrange for a sleep medicine doctor to see you    02/19/2021  f/u ov/Sue [name] re: dry cough/ sob on acei  Chief Complaint  Patient presents with   Follow-up    Sob with exertion, dry and productive cough with clear mucus for the past month   Dyspnea:  using rollator /pushing cart one aisle at grocery store then rides scooter, uses [facility] parking  Cough: sense of globus daytime Sleeping: bed is flat with lots of pillows SABA use: none  02: 3lpm 24/7    Covid status:  x 2 , never vax   No obvious day to day or daytime variability or assoc excess/ purulent sputum or mucus plugs or hemoptysis or cp or chest tightness, subjective wheeze or overt sinus or hb symptoms.   sleeping without nocturnal  or early am exacerbation  of respiratory  c/o's or need for noct saba. Also denies any obvious fluctuation of symptoms with weather or environmental changes or other aggravating or alleviating factors except as outlined above   No unusual exposure hx or h/o childhood pna/  asthma or knowledge of premature birth.  Current Allergies, Complete Past Medical History, Past Surgical History, Family History, and Social History were reviewed in [employer] record.  ROS  The following are not active complaints unless bolded Hoarseness, sore throat, dysphagia, dental problems, itching, sneezing,  nasal congestion or discharge of excess mucus or purulent secretions, ear ache,   fever, chills, sweats, unintended wt loss or wt gain, classically pleuritic or exertional cp,  orthopnea pnd or arm/hand swelling  or leg swelling, presyncope, palpitations, abdominal pain, anorexia, nausea, vomiting, diarrhea  or change in bowel habits or change in bladder habits, change in stools or change in urine, dysuria, hematuria,  rash, arthralgias, visual complaints, headache, numbness, weakness or ataxia or problems with walking or coordination,  change in mood or  memory.        Current Meds  Medication Sig   Accu-Chek FastClix Lancets MISC USE TO CHECK BLOOD SUGAR UP TO FOUR TIMES DAILY AS DIRECTED (Patient taking differently: See admin instructions. Up to 4 times a day)   ACCU-CHEK GUIDE test strip USE 1 STRIP TO CHECK GLUCOSE UP TO 4 TIMES DAILY   albuterol (PROVENTIL HFA;VENTOLIN HFA) 108 (90 Base) MCG/ACT inhaler Inhale 1-2 puffs into the lungs every 6 (six) hours as needed for wheezing or shortness of breath.   albuterol (PROVENTIL) (2.5 MG/3ML) 0.083% nebulizer solution Take 3 mLs (2.5 mg total) by nebulization every 6 (six) hours as needed for wheezing or shortness of breath.   blood glucose meter kit and supplies KIT Dispense based on patient and insurance preference. Use up to four times daily as directed. (FOR ICD-9 250.00, 250.01).   busPIRone (BUSPAR) 15 MG tablet Take 15 mg by mouth at bedtime.    famotidine (PEPCID) 20 MG tablet Take 20 mg by mouth daily as needed for heartburn or indigestion.   fluocinonide-emollient (LIDEX-E) 0.05 % cream Apply 1  application topically 2 (two) times daily. (Patient taking differently: Apply 1 application topically See admin instructions. Apply to rash on buttocks twice a day)   fluticasone (FLONASE) 50 MCG/ACT nasal spray Place 2 sprays into both nostrils daily as needed for allergies.   glipiZIDE (GLUCOTROL) 5 MG tablet TAKE 1 TABLET BY MOUTH TWICE DAILY BEFORE A MEAL   Insulin Syringe-Needle U-100 28G X 5/16" 0.5 ML MISC Use with lantus once a day   LANTUS 100 UNIT/ML injection INJECT 30 UNITS SUBCUTANEOUSLY  AT BEDTIME   levothyroxine (SYNTHROID) 75 MCG tablet Take 1 tablet (75 mcg total) by mouth daily.   levothyroxine (SYNTHROID) 88 MCG tablet Take 1 tablet (88 mcg total) by mouth daily.   lisinopril (ZESTRIL) 20 MG tablet Take 1 tablet by mouth once daily (Patient taking differently: Take 20 mg by mouth daily.)   LORazepam (ATIVAN) 1 MG tablet Take 0.5-1.5 mg by mouth See admin instructions. Take 1-1.5 mg by mouth at bedtime and an additional 0.5 mg once a day as needed for anxiety   Magnesium 400 MG CAPS Take 1 capsule by mouth daily.   metFORMIN (GLUCOPHAGE) 500 MG tablet TAKE 2 TABLETS BY MOUTH TWICE DAILY WITH A  MEAL   methocarbamol (ROBAXIN) 500 MG tablet Take 1 tablet (500 mg total) by mouth 4 (four) times daily.   metoprolol succinate (TOPROL-XL) 50 MG 24 hr tablet Take 2 tablets (100 mg total) by mouth daily. Take with or immediately following a meal.   NONFORMULARY OR COMPOUNDED ITEM Pt/ inr   Dx dvt   Tomorrow 04/05/2019  Please call office [phone] with results   NONFORMULARY OR COMPOUNDED ITEM PT/ inr    Dx dvt   NONFORMULARY OR COMPOUNDED ITEM Compression stockings  20-30 mm/hg  #1   Dx low ext edema   OXYGEN Inhale 3-4 L/min into the lungs continuous.    potassium chloride SA (KLOR-CON) 20 MEQ tablet TAKE 1  BY MOUTH ONCE DAILY   QUEtiapine (SEROQUEL) 100 MG tablet Take 150 mg by mouth at bedtime.    torsemide (DEMADEX) 20 MG tablet Take 2 tablets (40 mg total) by mouth daily.    traMADol (ULTRAM) 50 MG tablet Take 1 tablet (50 mg total) by mouth every 6 (six) hours as needed.   zolpidem (AMBIEN) 10 MG tablet Take 10 mg by mouth at bedtime.                 Objective:   Physical Exam  02/19/2021   295  05/09/2018   304  11/12/2013         297  > 11/29/2013  292 >    10/23/13 294 lb (133.358 kg)  10/15/13 286 lb (129.729 kg)  10/12/13 290 lb 12.6 oz (131.9 kg)       Vital signs reviewed  02/19/2021  - Note at rest 02 sats  97% on RA   General appearance:    amb obese bf/ spont dry cough   HEENT : pt wearing mask not removed for exam due to covid -19 concerns.    NECK :  without JVD/Nodes/TM/ nl carotid upstrokes bilaterally   LUNGS: no acc muscle use,  Nl contour chest which is clear to A and P bilaterally without cough on insp or exp maneuvers   CV:  RRR  no s3 or murmur or increase in P2, and no edema   ABD:  obese soft and nontender with nl inspiratory excursion in the supine position. No bruits or organomegaly appreciated, bowel sounds nl  MS:   ext warm without deformities, calf tenderness, cyanosis or clubbing No obvious joint restrictions   SKIN: warm and dry without lesions    NEURO:  alert, approp, nl sensorium with  no motor or cerebellar deficits apparent.              Assessment & Plan:

## 2021-02-23 ENCOUNTER — Encounter: Payer: Self-pay | Admitting: Internal Medicine

## 2021-02-23 NOTE — Assessment & Plan Note (Signed)
-   placed on 02 2lpm at d/c 10/12/13 s hypercarbia on bmet  - 11/29/2013 reports sats 89% with exercise with PT  - 05/09/2018   Walked  3lpm  x one lap @ 185 stopped due to  Sob, nl  pace, sats ok at end  - 02/19/2021   Walked on ra x one lap =  approx 250 ft -@ very slow  pace stopped due to sob with sats 88%  Corrected on 3lpm    Pt using 02 correctly, still needs it and benefiting from it > no change rx

## 2021-02-23 NOTE — Assessment & Plan Note (Signed)
Trial off acei 02/19/2021 due to daytime dry cough/globus   In the best review of chronic cough to date ( NEJM 2016 375 [id number]) ,  ACEi are now felt to cause cough in up to  20% of pts which is a 4 fold increase from previous reports and does not include the variety of non-specific complaints we see in pulmonary clinic in pts on ACEi but previously attributed to another dx like  Copd/asthma and  include PNDS, throat and chest congestion, globus senssation, "bronchitis", unexplained dyspnea and noct "strangling" sensations, and hoarseness, but also  atypical /refractory GERD symptoms like dysphagia and "bad heartburn"   The only way I know  to prove this is not an "ACEi Case" is a trial off ACEi x a minimum of 6 weeks then regroup if not better  Try benicar 20 mg daily, f/u here if needed and with PCP for BP f/u long term.

## 2021-02-23 NOTE — Assessment & Plan Note (Signed)
Body mass index is 52.26 kg/m.  -  trending down slightly  Lab Results  Component Value Date   TSH 2.93 02/01/2021     Contributing to gerd risk/ doe/reviewed the need and the process to achieve and maintain neg calorie balance > defer f/u primary care including intermittently monitoring thyroid status    Medical decision making was a moderate level of complexity in this case because of  two chronic conditions /diagnoses requiring extra time for  H and P, chart review, counseling, directly observing portions of 02 qualification study,  and generating customized AVS unique to this office visit and charting.   Each maintenance medication was reviewed in detail including emphasizing most importantly the difference between maintenance and prns and under what circumstances the prns are to be triggered using an action plan format where appropriate. Please see avs for details which were reviewed in writing by both me and my nurse and patient given a written copy highlighted where appropriate with yellow highlighter for the patient's continued care at home along with an updated version of their medications.  Patient was asked to maintain medication reconciliation by comparing this list to the actual medications being used at home and to contact this office right away if there is a conflict or discrepancy.

## 2021-02-23 NOTE — Assessment & Plan Note (Addendum)
Onset was 2013 at wt 180  - PFT's3/20/15 wnl except DLCO 62 corrects to 75  - Try off acei 02/19/2021 @ wt 295    Most of her problems stem for MO at this point  (see separate a/p)

## 2021-03-01 ENCOUNTER — Inpatient Hospital Stay (HOSPITAL_BASED_OUTPATIENT_CLINIC_OR_DEPARTMENT_OTHER): Admission: RE | Admit: 2021-03-01 | Payer: Medicare HMO | Source: Ambulatory Visit

## 2021-03-02 ENCOUNTER — Other Ambulatory Visit: Payer: Self-pay | Admitting: Family Medicine

## 2021-03-02 DIAGNOSIS — E876 Hypokalemia: Secondary | ICD-10-CM

## 2021-03-08 ENCOUNTER — Telehealth: Payer: Self-pay | Admitting: Family Medicine

## 2021-03-08 NOTE — Telephone Encounter (Signed)
Spoke with pt and pt's daughter. Pt put on for a virtual tomorrow evening. Pt and daughter advised of worsening sxs to seek emergency care.

## 2021-03-08 NOTE — Telephone Encounter (Signed)
Pt tested positive on yesterday and is requesting the anti viral drug, pt would like a call back

## 2021-03-08 NOTE — Telephone Encounter (Signed)
Pt's daughter, [name], called back to let office know pt is "in and out of it" and not making a lot of sense.  She will be going over to check on pt.  [name] knows to take pt in to ER if drastic changes.  Tanisha's CB # is [phone].

## 2021-03-09 ENCOUNTER — Other Ambulatory Visit: Payer: Self-pay

## 2021-03-09 ENCOUNTER — Telehealth (INDEPENDENT_AMBULATORY_CARE_PROVIDER_SITE_OTHER): Payer: Medicare HMO | Admitting: Family Medicine

## 2021-03-09 ENCOUNTER — Encounter: Payer: Self-pay | Admitting: Family Medicine

## 2021-03-09 VITALS — Temp 99.0°F

## 2021-03-09 DIAGNOSIS — U071 COVID-19: Secondary | ICD-10-CM

## 2021-03-09 DIAGNOSIS — J208 Acute bronchitis due to other specified organisms: Secondary | ICD-10-CM | POA: Insufficient documentation

## 2021-03-09 MED ORDER — MOLNUPIRAVIR EUA 200MG CAPSULE: 4.0000 | ORAL_CAPSULE | Freq: Two times a day (BID) | ORAL | 0 refills | Status: AC

## 2021-03-09 MED ORDER — HYDROCOD POLST-CPM POLST ER 10-8 MG/5ML PO SUER: 5.0000 mL | Freq: Two times a day (BID) | ORAL | 0 refills | Status: DC | PRN

## 2021-03-09 MED ORDER — AMOXICILLIN-POT CLAVULANATE 875-125 MG PO TABS: 1.0000 | ORAL_TABLET | Freq: Two times a day (BID) | ORAL | 0 refills | Status: DC

## 2021-03-09 NOTE — Progress Notes (Signed)
MyChart Video Visit    Virtual Visit via Video Note   This visit type was conducted due to national recommendations for restrictions regarding the COVID-19 Pandemic (e.g. social distancing) in an effort to limit this patient's exposure and mitigate transmission in our community. This patient is at least at moderate risk for complications without adequate follow up. This format is felt to be most appropriate for this patient at this time. Physical exam was limited by quality of the video and audio technology used for the visit. Sue [name] was able to get the patient set up on a video visit.  Patient location: home with her daughter  Patient and provider in visit Provider location: Office  I discussed the limitations of evaluation and management by telemedicine and the availability of in person appointments. The patient expressed understanding and agreed to proceed.  Visit Date: 03/09/2021  Today's healthcare provider: Ann Held, DO     Subjective:    Patient ID: Sue [name], female    DOB: Jan 27, 1951, 70 y.o.   MRN: [id number]  Chief Complaint  Patient presents with   Generalized Body Aches   Chills   low grade fever   Cough   Covid Positive    Tested positive on Sunday and started feeling sick on Saturday    HPI Patient is in today for + yellow mucus and cough and fever 103.    her symptoms started Sat and she tested + for covid yesterday  No sob or no worse than usual , + laryngitis    no wheezing ---- her daughter brought it home with her -- she works in health care.   Past Medical History:  Diagnosis Date   Allergic rhinitis    Depression    Diabetes mellitus type 2, uncontrolled ([facility])    Emphysema of lung ([facility])    3L home O2   GERD (gastroesophageal reflux disease)    Hypertension    Hypothyroidism    Obesity, morbid, BMI 50 or higher ([facility])    Stroke ([facility]) 2016   TIA    Urine incontinence     Past Surgical History:  Procedure  Laterality Date   ABDOMINAL HYSTERECTOMY     CESAREAN SECTION     COLONOSCOPY N/A 08/19/2019   Procedure: COLONOSCOPY;  Surgeon: Clarene Essex, MD;  Location: [facility];  Service: Endoscopy;  Laterality: N/A;   COLONOSCOPY WITH PROPOFOL N/A 08/05/2019   Procedure: COLONOSCOPY WITH PROPOFOL;  Surgeon: Wilford Corner, MD;  Location: [facility];  Service: Gastroenterology;  Laterality: N/A;   COLONOSCOPY WITH PROPOFOL N/A 11/19/2019   Procedure: COLONOSCOPY WITH PROPOFOL;  Surgeon: Ronald Lobo, MD;  [facility]: [facility];  Service: Endoscopy;  Laterality: N/A;  Unprepped   COLONOSCOPY WITH PROPOFOL N/A 11/22/2019   Procedure: COLONOSCOPY WITH PROPOFOL;  Surgeon: Ronald Lobo, MD;  [facility];  Service: Endoscopy;  Laterality: N/A;   COLONOSCOPY WITH PROPOFOL N/A 11/23/2019   Procedure: COLONOSCOPY WITH PROPOFOL;  Surgeon: [name], MD;  [facility];  Service: Gastroenterology;  Laterality: N/A;   COLONOSCOPY WITH PROPOFOL N/A 11/29/2019   Procedure: COLONOSCOPY WITH PROPOFOL;  Surgeon: Wilford Corner, MD;  [facility];  Service: Endoscopy;  Laterality: N/A;   ENTEROSCOPY N/A 11/24/2019   Procedure: ENTEROSCOPY;  Surgeon: [name], MD;  [facility];  Service: Gastroenterology;  Laterality: N/A;   ENTEROSCOPY N/A 11/27/2019   Procedure: ENTEROSCOPY;  Surgeon: Wilford Corner, MD;  [facility];  Service: Endoscopy;  Laterality: N/A;  ESOPHAGOGASTRODUODENOSCOPY N/A 11/27/2019   Procedure: ESOPHAGOGASTRODUODENOSCOPY (EGD);  Surgeon: Wilford Corner, MD;  Location: [facility];  Service: Endoscopy;  Laterality: N/A;   ESOPHAGOGASTRODUODENOSCOPY (EGD) WITH PROPOFOL N/A 11/24/2019   Procedure: ESOPHAGOGASTRODUODENOSCOPY (EGD) WITH PROPOFOL;  Surgeon: [name], MD;  [facility];  Service: Gastroenterology;  Laterality: N/A;  PUSH enteroscopy   GIVENS CAPSULE STUDY N/A 11/19/2019   Procedure: GIVENS CAPSULE STUDY;   Surgeon: Ronald Lobo, MD;  [facility];  Service: Endoscopy;  Laterality: N/A;  To be performed immediately following colonoscopy   GIVENS CAPSULE STUDY N/A 11/24/2019   Procedure: GIVENS CAPSULE STUDY;  Surgeon: [name], MD;  [facility];  Service: Gastroenterology;  Laterality: N/A;   GIVENS CAPSULE STUDY N/A 11/28/2019   Procedure: GIVENS CAPSULE STUDY;  Surgeon: Wilford Corner, MD;  [facility];  Service: Endoscopy;  Laterality: N/A;   HEMOSTASIS CLIP PLACEMENT  11/19/2019   Procedure: HEMOSTASIS CLIP PLACEMENT;  Surgeon: Ronald Lobo, MD;  [facility];  Service: Endoscopy;;   HEMOSTASIS CLIP PLACEMENT  11/22/2019   Procedure: HEMOSTASIS CLIP PLACEMENT;  Surgeon: Ronald Lobo, MD;  [facility];  Service: Endoscopy;;   HOT HEMOSTASIS N/A 11/24/2019   Procedure: HOT HEMOSTASIS (ARGON PLASMA COAGULATION/BICAP);  Surgeon: [name], MD;  Location: [facility];  Service: Gastroenterology;  Laterality: N/A;   HOT HEMOSTASIS N/A 11/27/2019   Procedure: HOT HEMOSTASIS (ARGON PLASMA COAGULATION/BICAP);  Surgeon: Wilford Corner, MD;  Location: [facility];  Service: Endoscopy;  Laterality: N/A;   SUBMUCOSAL TATTOO INJECTION  11/19/2019   Procedure: SUBMUCOSAL TATTOO INJECTION;  Surgeon: Ronald Lobo, MD;  [facility];  Service: Endoscopy;;    Family History  Problem Relation Age of Onset   Heart disease Father        MVP and Pics Valve   Hypertension Father    Depression Father        Institutionalized x's 2 years   Bipolar disorder Father    Hypertension Sister    Diabetes Sister    Hyperlipidemia Sister    Heart disease Sister 32       MI   Heart disease Brother    Hypertension Brother    Heart disease Paternal Grandmother    Heart disease Paternal Aunt    Heart disease Paternal Uncle    Schizophrenia Paternal Aunt    Asthma Son    Asthma Son     Social History   Socioeconomic History   Marital  status: Divorced    Spouse name: Not on file   Number of children: Not on file   Years of education: Not on file   Highest education level: Not on file  Occupational History   Occupation: disabled- [employer]  Tobacco Use   Smoking status: Former    Packs/day: 1.00    Years: 40.00    Pack years: 40.00    Types: Cigarettes    Start date: 07/21/1972    Quit date: 10/16/2011    Years since quitting: 9.4   Smokeless tobacco: Never  Vaping Use   Vaping Use: Never used  Substance and Sexual Activity   Alcohol use: Not Currently    Comment: Occ-- Wine   Drug use: No   Sexual activity: Not on file  Other Topics Concern   Not on file  Social History Narrative   [facility] (04/06/17):   Originally from [place]. Has also lived in [place], [place]. She also previously lived in [place] for 20 years. No history of  [place] Fever. Moved to [place] in 1989. 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 No pets currently. No bird exposure. Questionable previous mold exposure in her daughter's home. 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    Social Determinants of Health   Financial Resource Strain: Not on file  Food Insecurity: Not on file  Transportation Needs: Not on file  Physical Activity: Not on file  Stress: Not on file  Social Connections: Not on file  Intimate Partner Violence: Not on file    Outpatient Medications Prior to Visit  Medication Sig Dispense Refill   Accu-Chek FastClix Lancets MISC USE TO CHECK BLOOD SUGAR UP TO FOUR TIMES DAILY AS DIRECTED (Patient taking differently: See admin instructions. Up to 4 times a day) 306 each 0   ACCU-CHEK GUIDE test strip USE 1 STRIP TO CHECK GLUCOSE UP TO 4 TIMES DAILY 100 each 0   albuterol (PROVENTIL HFA;VENTOLIN HFA) 108 (90 Base) MCG/ACT  inhaler Inhale 1-2 puffs into the lungs every 6 (six) hours as needed for wheezing or shortness of breath.     albuterol (PROVENTIL) (2.5 MG/3ML) 0.083% nebulizer solution Take 3 mLs (2.5 mg total) by nebulization every 6 (six) hours as needed for wheezing or shortness of breath. 150 mL 1   blood glucose meter kit and supplies KIT Dispense based on patient and insurance preference. Use up to four times daily as directed. (FOR ICD-9 250.00, 250.01). 1 each 0   busPIRone (BUSPAR) 15 MG tablet Take 15 mg by mouth at bedtime.   4   famotidine (PEPCID) 20 MG tablet Take 20 mg by mouth daily as needed for heartburn or indigestion.     fluocinonide-emollient (LIDEX-E) 0.05 % cream Apply 1 application topically 2 (two) times daily. (Patient taking differently: Apply 1 application topically See admin instructions. Apply to rash on buttocks twice a day) 30 g 2   fluticasone (FLONASE) 50 MCG/ACT nasal spray Place 2 sprays into both nostrils daily as needed for allergies.     glipiZIDE (GLUCOTROL) 5 MG tablet TAKE 1 TABLET BY MOUTH TWICE DAILY BEFORE A MEAL 60 tablet 0   Insulin Syringe-Needle U-100 28G X 5/16" 0.5 ML MISC Use with lantus once a day 100 each 1   LANTUS 100 UNIT/ML injection INJECT 30 UNITS SUBCUTANEOUSLY AT BEDTIME 30 mL 0   levothyroxine (SYNTHROID) 88 MCG tablet Take 1 tablet (88 mcg total) by mouth daily. 90 tablet 3   LORazepam (ATIVAN) 1 MG tablet Take 0.5-1.5 mg by mouth See admin instructions. Take 1-1.5 mg by mouth at bedtime and an additional 0.5 mg once a day as needed for anxiety     Magnesium 400 MG CAPS Take 1 capsule by mouth daily. 30 capsule 0   metFORMIN (GLUCOPHAGE) 500 MG tablet TAKE 2 TABLETS BY MOUTH TWICE DAILY WITH A  MEAL 120 tablet 3   methocarbamol (ROBAXIN) 500 MG tablet Take 1 tablet (500 mg total) by mouth 4 (four) times daily. 45 tablet 1   metoprolol succinate (TOPROL-XL) 50 MG 24 hr tablet Take 2 tablets (100 mg total) by mouth daily. Take with or immediately  following a meal. 180 tablet 1   NONFORMULARY OR COMPOUNDED ITEM Pt/ inr   Dx dvt   Tomorrow 04/05/2019  Please call office [phone] with results 1  each 0   NONFORMULARY OR COMPOUNDED ITEM PT/ inr    Dx dvt 1 each 0   NONFORMULARY OR COMPOUNDED ITEM Compression stockings  20-30 mm/hg  #1   Dx low ext edema 1 each 0   olmesartan (BENICAR) 20 MG tablet Take 1 tablet (20 mg total) by mouth daily. 30 tablet 2   OXYGEN Inhale 3-4 L/min into the lungs continuous.      potassium chloride SA (KLOR-CON) 20 MEQ tablet Take 1 tablet by mouth once daily 90 tablet 1   QUEtiapine (SEROQUEL) 100 MG tablet Take 150 mg by mouth at bedtime.      torsemide (DEMADEX) 20 MG tablet Take 2 tablets (40 mg total) by mouth daily. 60 tablet 1   traMADol (ULTRAM) 50 MG tablet Take 1 tablet (50 mg total) by mouth every 6 (six) hours as needed. 15 tablet 0   zolpidem (AMBIEN) 10 MG tablet Take 10 mg by mouth at bedtime.     levothyroxine (SYNTHROID) 75 MCG tablet Take 1 tablet (75 mcg total) by mouth daily. 90 tablet 1   No facility-administered medications prior to visit.    Allergies  Allergen Reactions   Hydrocodone Other (See Comments)    Constipation and hallucinations   Norvasc [Amlodipine Besylate] Swelling and Other (See Comments)    Marked swelling of the limbs   Tizanidine Other (See Comments)    After the 3rd dose, the patient's mouth began to feel numb and she felt like she was a having a "hot flash"    Review of Systems  Constitutional:  Positive for chills, fever and malaise/fatigue.  HENT:  Negative for congestion.   Eyes:  Negative for blurred vision.  Respiratory:  Positive for cough and sputum production. Negative for shortness of breath and wheezing.   Cardiovascular:  Negative for chest pain, palpitations and leg swelling.  Gastrointestinal:  Negative for abdominal pain, blood in stool and nausea.  Genitourinary:  Negative for dysuria and frequency.  Musculoskeletal:  Negative for falls.   Skin:  Negative for rash.  Neurological:  Negative for dizziness, loss of consciousness and headaches.  Endo/Heme/Allergies:  Negative for environmental allergies.  Psychiatric/Behavioral:  Negative for depression. The patient is not nervous/anxious.       Objective:    Physical Exam Vitals and nursing note reviewed.  Constitutional:      Appearance: She is well-developed.  HENT:     Head: Normocephalic and atraumatic.  Neck:     Thyroid: No thyromegaly.     Vascular: No JVD.  Pulmonary:     Effort: Pulmonary effort is normal.  Neurological:     Mental Status: She is alert and oriented to person, place, and time.  Psychiatric:        Mood and Affect: Mood normal.        Behavior: Behavior normal.        Thought Content: Thought content normal.        Judgment: Judgment normal.    Temp 99 F (37.2 C)   SpO2 96% Comment: 3 L o2 Wt Readings from Last 3 Encounters:  02/19/21 295 lb (133.8 kg)  02/01/21 (!) 301 lb (136.5 kg)  05/28/20 292 lb 9.6 oz (132.7 kg)    Diabetic Foot Exam - Simple   No data filed    Lab Results  Component Value Date   WBC 7.2 01/07/2020   HGB 9.3 (L) 01/07/2020   HCT 28.9 (L) 01/07/2020   PLT 327.0 01/07/2020   GLUCOSE  155 (H) 02/01/2021   CHOL 132 02/01/2021   TRIG 121.0 02/01/2021   HDL 33.70 (L) 02/01/2021   LDLCALC 74 02/01/2021   ALT 11 02/01/2021   AST 19 02/01/2021   NA 137 02/01/2021   K 4.2 02/01/2021   CL 100 02/01/2021   CREATININE 1.55 (H) 02/01/2021   BUN 16 02/01/2021   CO2 31 02/01/2021   TSH 2.93 02/01/2021   INR 1.2 01/01/2020   HGBA1C 7.7 (H) 02/01/2021    Lab Results  Component Value Date   TSH 2.93 02/01/2021   Lab Results  Component Value Date   WBC 7.2 01/07/2020   HGB 9.3 (L) 01/07/2020   HCT 28.9 (L) 01/07/2020   MCV 82.8 01/07/2020   PLT 327.0 01/07/2020   Lab Results  Component Value Date   NA 137 02/01/2021   K 4.2 02/01/2021   CO2 31 02/01/2021   GLUCOSE 155 (H) 02/01/2021   BUN 16  02/01/2021   CREATININE 1.55 (H) 02/01/2021   BILITOT 0.4 02/01/2021   ALKPHOS 169 (H) 02/01/2021   AST 19 02/01/2021   ALT 11 02/01/2021   PROT 8.0 02/01/2021   ALBUMIN 3.7 02/01/2021   CALCIUM 9.2 02/01/2021   ANIONGAP 11 01/01/2020   GFR 34.21 (L) 02/01/2021   Lab Results  Component Value Date   CHOL 132 02/01/2021   Lab Results  Component Value Date   HDL 33.70 (L) 02/01/2021   Lab Results  Component Value Date   LDLCALC 74 02/01/2021   Lab Results  Component Value Date   TRIG 121.0 02/01/2021   Lab Results  Component Value Date   CHOLHDL 4 02/01/2021   Lab Results  Component Value Date   HGBA1C 7.7 (H) 02/01/2021       Assessment & Plan:   Problem List Items Addressed This Visit       Unprioritized   Acute bronchitis due to COVID-19 virus - Primary    Unable to rx paxlovid due to pt kidney function molnupoiravir was prescribed Cough med and abx per orders Pt has her meds for nebulizer If she develops difficulty breathing or pulse ox <95%---  Go to ER  F/u prn        Relevant Medications   molnupiravir EUA 200 mg CAPS   amoxicillin-clavulanate (AUGMENTIN) 875-125 MG tablet   chlorpheniramine-HYDROcodone (TUSSIONEX PENNKINETIC ER) 10-8 MG/5ML SUER   Other Visit Diagnoses     COVID-19       Relevant Medications   molnupiravir EUA 200 mg CAPS       I am having Sue [name] start on molnupiravir EUA, amoxicillin-clavulanate, and chlorpheniramine-HYDROcodone. I am also having her maintain her OXYGEN, albuterol, albuterol, fluticasone, busPIRone, Insulin Syringe-Needle U-100, QUEtiapine, famotidine, LORazepam, NONFORMULARY OR COMPOUNDED ITEM, NONFORMULARY OR COMPOUNDED ITEM, NONFORMULARY OR COMPOUNDED ITEM, Accu-Chek FastClix Lancets, zolpidem, fluocinonide-emollient, traMADol, Magnesium, methocarbamol, levothyroxine, Accu-Chek Guide, Lantus, metFORMIN, metoprolol succinate, blood glucose meter kit and supplies, glipiZIDE, torsemide, olmesartan,  and potassium chloride SA.  Meds ordered this encounter  Medications   molnupiravir EUA 200 mg CAPS    Sig: Take 4 capsules (800 mg total) by mouth 2 (two) times daily for 5 days.    Dispense:  40 capsule    Refill:  0   amoxicillin-clavulanate (AUGMENTIN) 875-125 MG tablet    Sig: Take 1 tablet by mouth 2 (two) times daily.    Dispense:  20 tablet    Refill:  0   chlorpheniramine-HYDROcodone (TUSSIONEX PENNKINETIC ER) 10-8 MG/5ML SUER  Sig: Take 5 mLs by mouth every 12 (twelve) hours as needed for cough.    Dispense:  115 mL    Refill:  0    I discussed the assessment and treatment plan with the patient. The patient was provided an opportunity to ask questions and all were answered. The patient agreed with the plan and demonstrated an understanding of the instructions.   The patient was advised to call back or seek an in-person evaluation if the symptoms worsen or if the condition fails to improve as anticipated   Ann Held, [place] [facility] [employer] [facility] [phone] (phone) [phone] (fax)  [place]

## 2021-03-09 NOTE — Assessment & Plan Note (Signed)
Unable to rx paxlovid due to pt kidney function molnupoiravir was prescribed Cough med and abx per orders Pt has her meds for nebulizer If she develops difficulty breathing or pulse ox <95%---  Go to ER  F/u prn

## 2021-03-15 NOTE — Progress Notes (Deleted)
Name: Sue [name]  MRN/ DOB: [id number], November 08, 1950   Age/ Sex: 70 y.o., female    PCP: Carollee Herter, Alferd Apa, DO   Reason for Endocrinology Evaluation: Type 2 Diabetes Mellitus     Date of Initial Endocrinology Visit: 03/15/2021     PATIENT IDENTIFIER: Sue [name] is a 70 y.o. female with a past medical history of T2DM, COPD, Hypothyroidism. The patient presented for initial endocrinology clinic visit on 03/15/2021 for consultative assistance with her diabetes management.    HPI: Sue [name] was    Diagnosed with DM in 2019 Prior Medications tried/Intolerance: *** Currently checking blood sugars *** x / day,  before breakfast and ***.  Hypoglycemia episodes : ***               Symptoms: ***                 Frequency: ***/  Hemoglobin A1c has ranged from 5.7% in 221, peaking at 10.6% in 2019. Patient required assistance for hypoglycemia:  Patient has required hospitalization within the last 1 year from hyper or hypoglycemia:   In terms of diet, the patient ***   THYROID HISTORY:     HOME DIABETES REGIMEN: Metformin 500 mg  Glipizide 5 mg  Lantus    Statin: no ACE-I/ARB: On Olmesartan  Prior Diabetic Education: {Yes/No:11203}   METER DOWNLOAD SUMMARY: Date range evaluated: *** Fingerstick Blood Glucose Tests = *** Average Number Tests/Day = *** Overall Mean FS Glucose = *** Standard Deviation = ***  BG Ranges: Low = *** High = ***   Hypoglycemic Events/30 Days: BG < 50 = *** Episodes of symptomatic severe hypoglycemia = ***   DIABETIC COMPLICATIONS: Microvascular complications:  CKD III Denies: *** Last eye exam: Completed   Macrovascular complications:  CVA Denies: CAD, PVD   PAST HISTORY: Past Medical History:  Past Medical History:  Diagnosis Date   Allergic rhinitis    Depression    Diabetes mellitus type 2, uncontrolled ([place])    Emphysema of lung ([place])    3L home O2   GERD (gastroesophageal reflux disease)    Hypertension     Hypothyroidism    Obesity, morbid, BMI 50 or higher ([place])    Stroke ([place]) 2016   TIA    Urine incontinence    Past Surgical History:  Past Surgical History:  Procedure Laterality Date   ABDOMINAL HYSTERECTOMY     CESAREAN SECTION     COLONOSCOPY N/A 08/19/2019   Procedure: COLONOSCOPY;  Surgeon: Clarene Essex, MD;  Location: [facility];  Service: Endoscopy;  Laterality: N/A;   COLONOSCOPY WITH PROPOFOL N/A 08/05/2019   Procedure: COLONOSCOPY WITH PROPOFOL;  Surgeon: Wilford Corner, MD;  [facility]: [facility];  Service: Gastroenterology;  Laterality: N/A;   COLONOSCOPY WITH PROPOFOL N/A 11/19/2019   Procedure: COLONOSCOPY WITH PROPOFOL;  Surgeon: Ronald Lobo, MD;  [facility];  Service: Endoscopy;  Laterality: N/A;  Unprepped   COLONOSCOPY WITH PROPOFOL N/A 11/22/2019   Procedure: COLONOSCOPY WITH PROPOFOL;  Surgeon: Ronald Lobo, MD;  [facility];  Service: Endoscopy;  Laterality: N/A;   COLONOSCOPY WITH PROPOFOL N/A 11/23/2019   Procedure: COLONOSCOPY WITH PROPOFOL;  Surgeon: [name], MD;  [facility];  Service: Gastroenterology;  Laterality: N/A;   COLONOSCOPY WITH PROPOFOL N/A 11/29/2019   Procedure: COLONOSCOPY WITH PROPOFOL;  Surgeon: Wilford Corner, MD;  [facility];  Service: Endoscopy;  Laterality: N/A;   ENTEROSCOPY N/A 11/24/2019   Procedure: ENTEROSCOPY;  Surgeon: [name],  MD;  [facility];  Service: Gastroenterology;  Laterality: N/A;   ENTEROSCOPY N/A 11/27/2019   Procedure: ENTEROSCOPY;  Surgeon: Wilford Corner, MD;  [facility];  Service: Endoscopy;  Laterality: N/A;   ESOPHAGOGASTRODUODENOSCOPY N/A 11/27/2019   Procedure: ESOPHAGOGASTRODUODENOSCOPY (EGD);  Surgeon: Wilford Corner, MD;  Location: [facility];  Service: Endoscopy;  Laterality: N/A;   ESOPHAGOGASTRODUODENOSCOPY (EGD) WITH PROPOFOL N/A 11/24/2019   Procedure: ESOPHAGOGASTRODUODENOSCOPY (EGD) WITH PROPOFOL;  Surgeon: [name], MD;  [facility];  Service: Gastroenterology;  Laterality: N/A;  PUSH enteroscopy   GIVENS CAPSULE STUDY N/A 11/19/2019   Procedure: GIVENS CAPSULE STUDY;  Surgeon: Ronald Lobo, MD;  [facility];  Service: Endoscopy;  Laterality: N/A;  To be performed immediately following colonoscopy   GIVENS CAPSULE STUDY N/A 11/24/2019   Procedure: GIVENS CAPSULE STUDY;  Surgeon: [name], MD;  [facility];  Service: Gastroenterology;  Laterality: N/A;   GIVENS CAPSULE STUDY N/A 11/28/2019   Procedure: GIVENS CAPSULE STUDY;  Surgeon: Wilford Corner, MD;  [facility];  Service: Endoscopy;  Laterality: N/A;   HEMOSTASIS CLIP PLACEMENT  11/19/2019   Procedure: HEMOSTASIS CLIP PLACEMENT;  Surgeon: Ronald Lobo, MD;  [facility];  Service: Endoscopy;;   HEMOSTASIS CLIP PLACEMENT  11/22/2019   Procedure: HEMOSTASIS CLIP PLACEMENT;  Surgeon: Ronald Lobo, MD;  [facility];  Service: Endoscopy;;   HOT HEMOSTASIS N/A 11/24/2019   Procedure: HOT HEMOSTASIS (ARGON PLASMA COAGULATION/BICAP);  Surgeon: [name], MD;  Location: [facility];  Service: Gastroenterology;  Laterality: N/A;   HOT HEMOSTASIS N/A 11/27/2019   Procedure: HOT HEMOSTASIS (ARGON PLASMA COAGULATION/BICAP);  Surgeon: Wilford Corner, MD;  Location: [facility];  Service: Endoscopy;  Laterality: N/A;   SUBMUCOSAL TATTOO INJECTION  11/19/2019   Procedure: SUBMUCOSAL TATTOO INJECTION;  Surgeon: Ronald Lobo, MD;  [facility];  Service: Endoscopy;;    Social History:  reports that she quit smoking about 9 years ago. Her smoking use included cigarettes. She started smoking about 48 years ago. She has a 40.00 pack-year smoking history. She has never used smokeless tobacco. She reports previous alcohol use. She reports that she does not use drugs. Family History:  Family History  Problem Relation Age of Onset   Heart disease Father        MVP and Pics Valve    Hypertension Father    Depression Father        Institutionalized x's 2 years   Bipolar disorder Father    Hypertension Sister    Diabetes Sister    Hyperlipidemia Sister    Heart disease Sister 12       MI   Heart disease Brother    Hypertension Brother    Heart disease Paternal Grandmother    Heart disease Paternal Aunt    Heart disease Paternal Uncle    Schizophrenia Paternal Aunt    Asthma Son    Asthma Son      HOME MEDICATIONS: Allergies as of 03/16/2021       Reactions   Hydrocodone Other (See Comments)   Constipation and hallucinations   Norvasc [amlodipine Besylate] Swelling, Other (See Comments)   Marked swelling of the limbs   Tizanidine Other (See Comments)   After the 3rd dose, the patient's mouth began to feel numb and she felt like she was a having a "hot flash"        Medication List        Accurate as of March 15, 2021  3:29 PM.  If you have any questions, ask your nurse or doctor.          Accu-Chek FastClix Lancets Misc USE TO CHECK BLOOD SUGAR UP TO FOUR TIMES DAILY AS DIRECTED What changed: See the new instructions.   Accu-Chek Guide test strip Generic drug: glucose blood USE 1 STRIP TO CHECK GLUCOSE UP TO 4 TIMES DAILY   albuterol 108 (90 Base) MCG/ACT inhaler Commonly known as: VENTOLIN HFA Inhale 1-2 puffs into the lungs every 6 (six) hours as needed for wheezing or shortness of breath.   albuterol (2.5 MG/3ML) 0.083% nebulizer solution Commonly known as: PROVENTIL Take 3 mLs (2.5 mg total) by nebulization every 6 (six) hours as needed for wheezing or shortness of breath.   amoxicillin-clavulanate 875-125 MG tablet Commonly known as: Augmentin Take 1 tablet by mouth 2 (two) times daily.   blood glucose meter kit and supplies Kit Dispense based on patient and insurance preference. Use up to four times daily as directed. (FOR ICD-9 250.00, 250.01).   busPIRone 15 MG tablet Commonly known as: BUSPAR Take 15 mg by mouth at bedtime.    chlorpheniramine-HYDROcodone 10-8 MG/5ML Suer Commonly known as: Tussionex Pennkinetic ER Take 5 mLs by mouth every 12 (twelve) hours as needed for cough.   famotidine 20 MG tablet Commonly known as: PEPCID Take 20 mg by mouth daily as needed for heartburn or indigestion.   fluocinonide-emollient 0.05 % cream Commonly known as: LIDEX-E Apply 1 application topically 2 (two) times daily. What changed:  when to take this additional instructions   fluticasone 50 MCG/ACT nasal spray Commonly known as: FLONASE Place 2 sprays into both nostrils daily as needed for allergies.   glipiZIDE 5 MG tablet Commonly known as: GLUCOTROL TAKE 1 TABLET BY MOUTH TWICE DAILY BEFORE A MEAL   Insulin Syringe-Needle U-100 28G X 5/16" 0.5 ML Misc Use with lantus once a day   Lantus 100 UNIT/ML injection Generic drug: insulin glargine INJECT 30 UNITS SUBCUTANEOUSLY AT BEDTIME   levothyroxine 88 MCG tablet Commonly known as: SYNTHROID Take 1 tablet (88 mcg total) by mouth daily.   LORazepam 1 MG tablet Commonly known as: ATIVAN Take 0.5-1.5 mg by mouth See admin instructions. Take 1-1.5 mg by mouth at bedtime and an additional 0.5 mg once a day as needed for anxiety   Magnesium 400 MG Caps Take 1 capsule by mouth daily.   metFORMIN 500 MG tablet Commonly known as: GLUCOPHAGE TAKE 2 TABLETS BY MOUTH TWICE DAILY WITH A  MEAL   methocarbamol 500 MG tablet Commonly known as: Robaxin Take 1 tablet (500 mg total) by mouth 4 (four) times daily.   metoprolol succinate 50 MG 24 hr tablet Commonly known as: TOPROL-XL Take 2 tablets (100 mg total) by mouth daily. Take with or immediately following a meal.   NONFORMULARY OR COMPOUNDED ITEM Pt/ inr   Dx dvt   Tomorrow 04/05/2019  Please call office [phone] with results   NONFORMULARY OR COMPOUNDED ITEM PT/ inr    Dx dvt   NONFORMULARY OR COMPOUNDED ITEM Compression stockings  20-30 mm/hg  #1   Dx low ext edema   olmesartan 20 MG  tablet Commonly known as: BENICAR Take 1 tablet (20 mg total) by mouth daily.   OXYGEN Inhale 3-4 L/min into the lungs continuous.   potassium chloride SA 20 MEQ tablet Commonly known as: KLOR-CON Take 1 tablet by mouth once daily   QUEtiapine 100 MG tablet Commonly known as: SEROQUEL Take 150 mg by mouth at bedtime.  torsemide 20 MG tablet Commonly known as: DEMADEX Take 2 tablets (40 mg total) by mouth daily.   traMADol 50 MG tablet Commonly known as: ULTRAM Take 1 tablet (50 mg total) by mouth every 6 (six) hours as needed.   zolpidem 10 MG tablet Commonly known as: AMBIEN Take 10 mg by mouth at bedtime.         ALLERGIES: Allergies  Allergen Reactions   Hydrocodone Other (See Comments)    Constipation and hallucinations   Norvasc [Amlodipine Besylate] Swelling and Other (See Comments)    Marked swelling of the limbs   Tizanidine Other (See Comments)    After the 3rd dose, the patient's mouth began to feel numb and she felt like she was a having a "hot flash"     REVIEW OF SYSTEMS: A comprehensive ROS was conducted with the patient and is negative except as per HPI and below:  ROS    OBJECTIVE:   VITAL SIGNS: There were no vitals taken for this visit.   PHYSICAL EXAM:  General: Pt appears well and is in NAD  Lungs: Clear with good BS bilat with no rales, rhonchi, or wheezes  Heart: RRR with normal S1 and S2 and no gallops; no murmurs; no rub  Abdomen: Normoactive bowel sounds, soft, nontender, without masses or organomegaly palpable  Extremities:  Lower extremities - No pretibial edema. No lesions.  Skin: Normal texture and temperature to palpation. No rash noted. No Acanthosis nigricans/skin tags. No lipohypertrophy.  Neuro: MS is good with appropriate affect, pt is alert and Ox3    DM foot exam:    DATA REVIEWED:  Lab Results  Component Value Date   HGBA1C 7.7 (H) 02/01/2021   HGBA1C 8.6 (H) 05/28/2020   HGBA1C 5.7 01/07/2020   Lab  Results  Component Value Date   LDLCALC 74 02/01/2021   CREATININE 1.55 (H) 02/01/2021   No results found for: [facility]  Lab Results  Component Value Date   CHOL 132 02/01/2021   HDL 33.70 (L) 02/01/2021   LDLCALC 74 02/01/2021   TRIG 121.0 02/01/2021   CHOLHDL 4 02/01/2021        ASSESSMENT / PLAN / RECOMMENDATIONS:   1) Type *** Diabetes Mellitus, ***controlled, With CKD III and macrovascular complications - Most recent A1c of *** %. Goal A1c < 7.0 %.    Plan: GENERAL: ***  MEDICATIONS: ***  EDUCATION / INSTRUCTIONS: BG monitoring instructions: Patient is instructed to check her blood sugars *** times a day, ***. Call [facility] if: BG persistently < 70  I reviewed the Rule of 15 for the treatment of hypoglycemia in detail with the patient. Literature supplied.   2) Diabetic complications:  Eye: Does *** have known diabetic retinopathy.  Neuro/ Feet: Does *** have known diabetic peripheral neuropathy. Renal: Patient does  have known baseline CKD. She is on an ACEI/ARB at present.  3) Lipids: Patient is *** on a statin.    4) Hypertension: ***  at goal of < 140/90 mmHg.       Signed electronically by: Mack Guise, MD  [facility]  [facility] [address] [place], [place] [address] Phone: [phone] FAX: [phone]   CC: [address] [place] [address] Phone: [phone]  Fax: [phone]    Return to Endocrinology clinic as below: Future Appointments  Date Time Provider [place]  03/16/2021 10:50 AM Kahli Fitzgerald, Melanie Crazier, MD  [facility]  05/10/2021 11:00 AM Lowne Koren Shiver, DO [facility]

## 2021-03-16 ENCOUNTER — Ambulatory Visit: Payer: Medicare HMO | Admitting: Internal Medicine

## 2021-03-16 IMAGING — CT CT ABD-PELV W/ CM
2 of 5 series · 16 of 46 positions shown, 18 images · IV contrast (omnipaque)
Comparison: 03/08/2019

CLINICAL DATA: Abdominal pain, rectal bleeding

EXAM:
CT ABDOMEN AND PELVIS WITH CONTRAST
TECHNIQUE: Multidetector CT imaging of the abdomen and pelvis was performed
using the standard protocol following bolus administration of
intravenous contrast.
CONTRAST:  100mL OMNIPAQUE IOHEXOL 300 MG/ML  SOLN

[Series 4: axial st · axial · 0.79mm/px · z∈[-286,+94]mm · 13 of 90 slices shown, 15 images]
[im 7/90  soft-tissue]
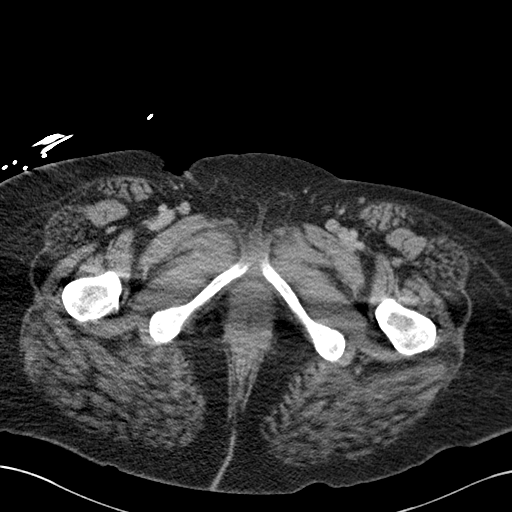
[im 7/90  bone]
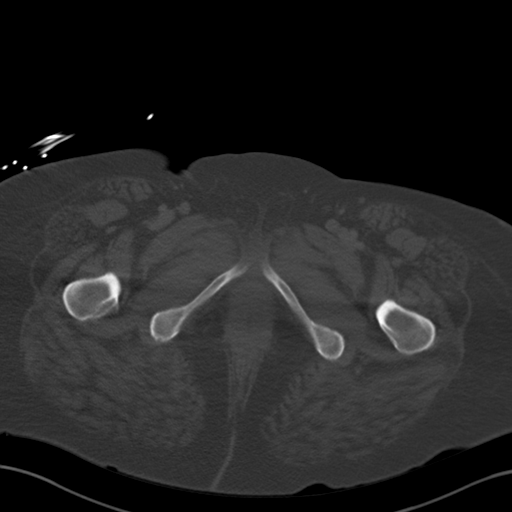
[im 13/90  soft-tissue]
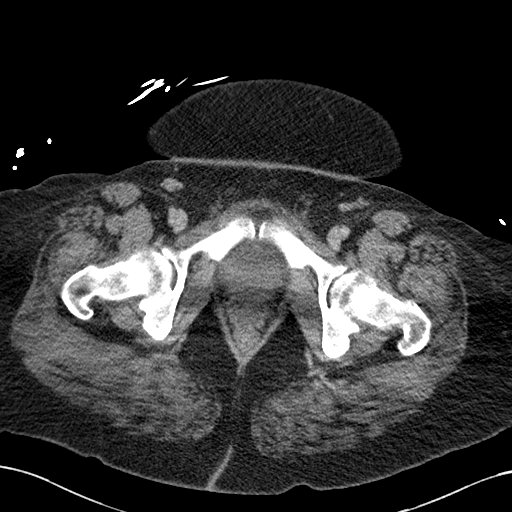
[im 20/90  soft-tissue]
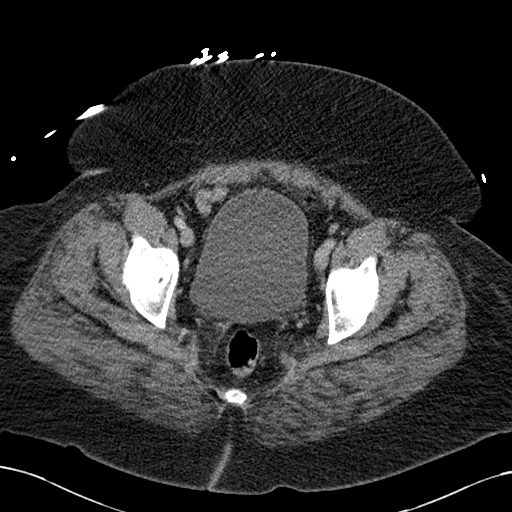
[im 26/90  soft-tissue]
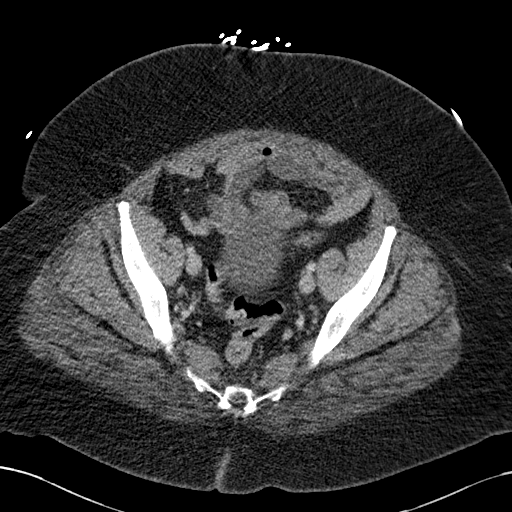
[im 32/90  soft-tissue]
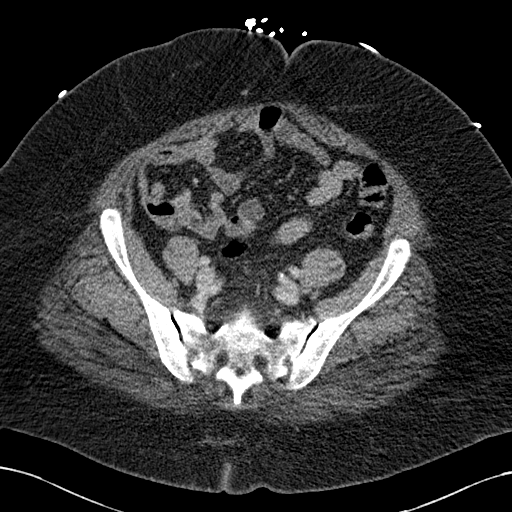
[im 39/90  soft-tissue]
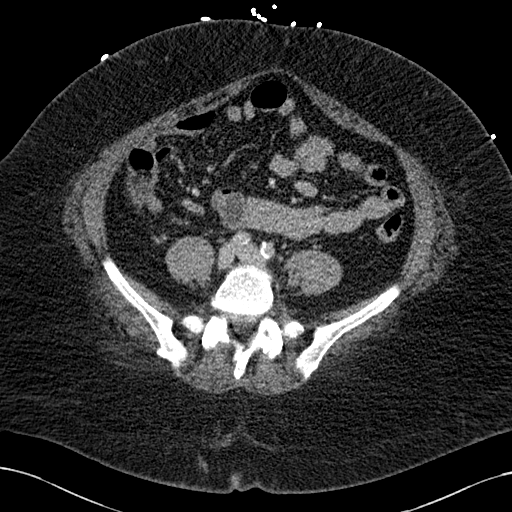
[im 45/90  soft-tissue]
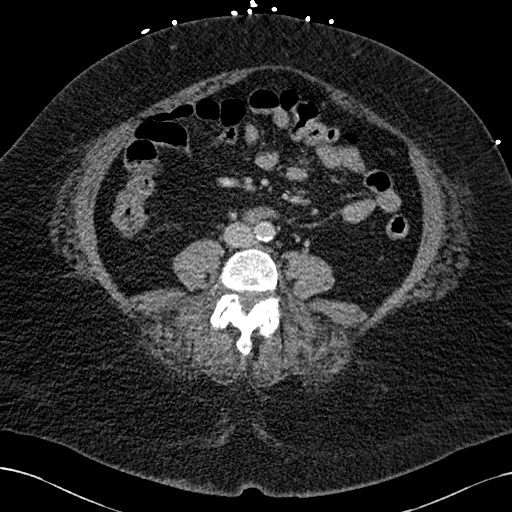
[im 51/90  soft-tissue]
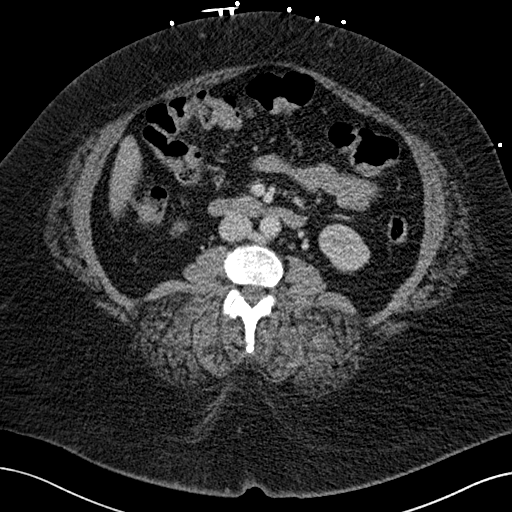
[im 58/90  soft-tissue]
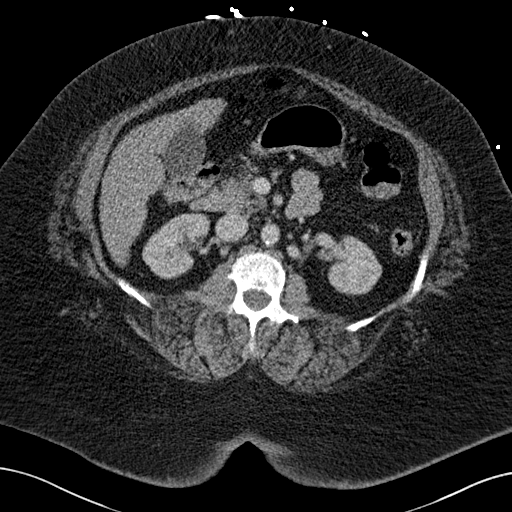
[im 58/90  bone]
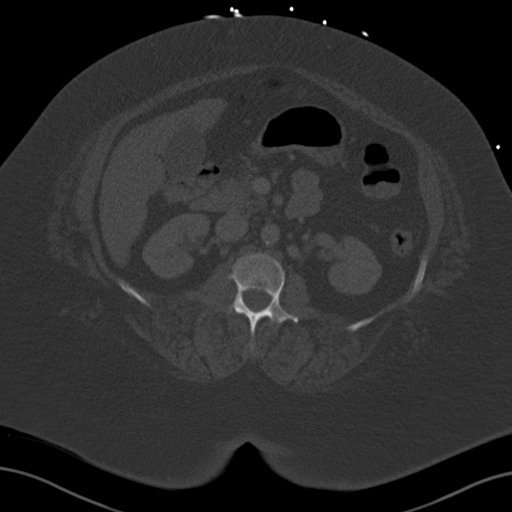
[im 64/90  soft-tissue]
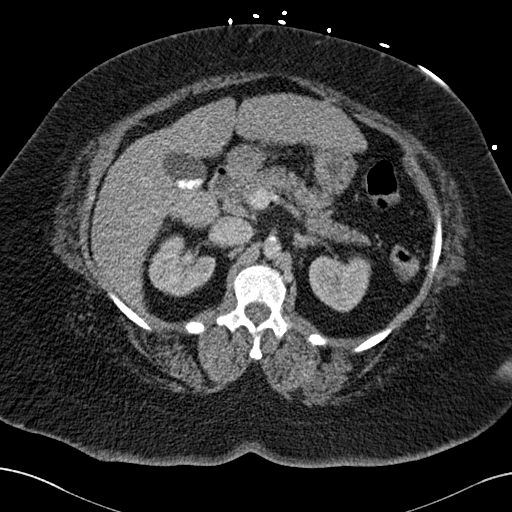
[im 70/90  soft-tissue]
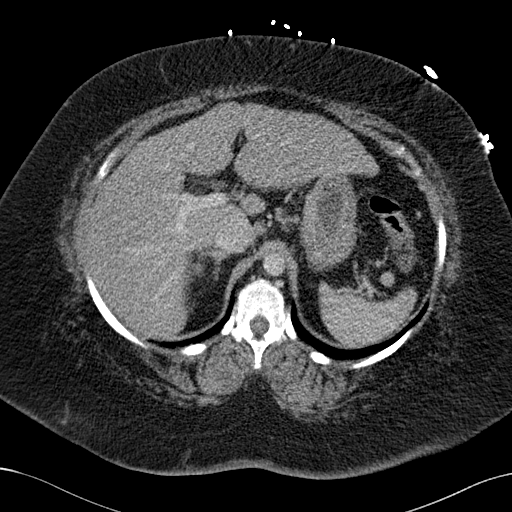
[im 77/90  soft-tissue]
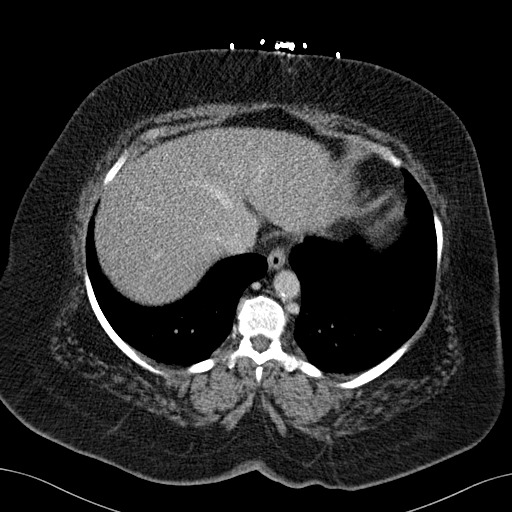
[im 83/90  soft-tissue]
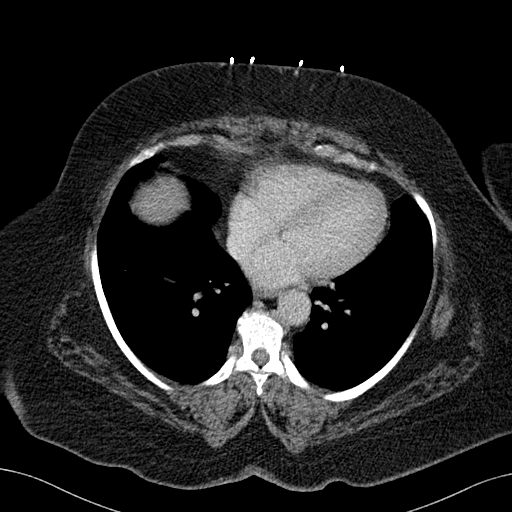

[Series 6: coronal st · coronal · 0.81mm/px · 3 of 181 slices shown]
[im 61/181  soft-tissue]
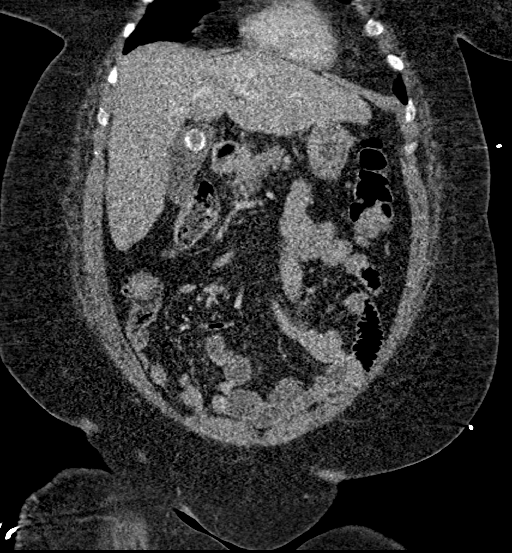
[im 81/181  soft-tissue]
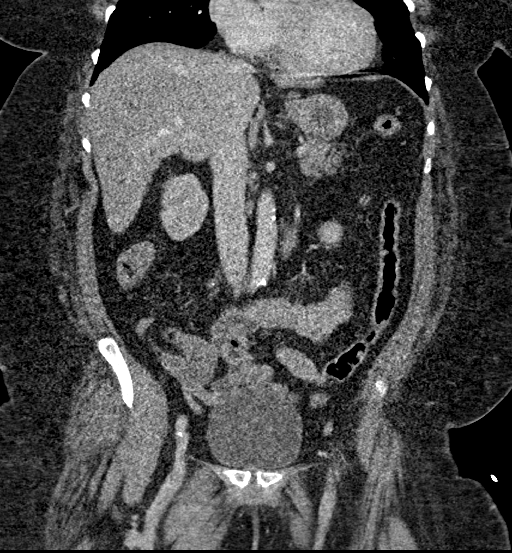
[im 101/181  soft-tissue]
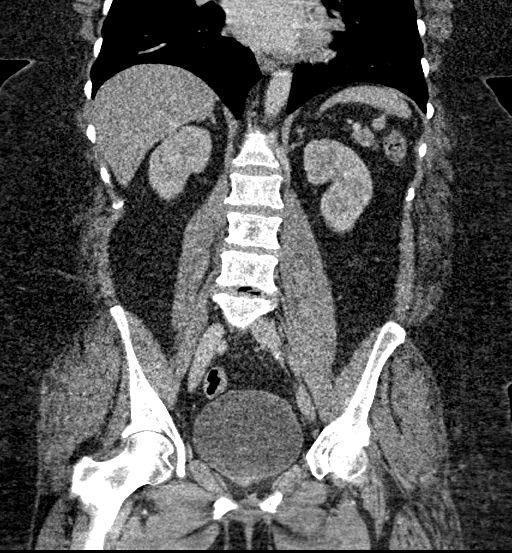

[16 of 46 positions shown; findings below may reference images not displayed]

FINDINGS: Lower chest: Incompletely visualized 5 mm nodular density within the
superior aspect of the right lower lobe (series 5, image 1). Lung
bases otherwise clear.

Hepatobiliary: No focal liver abnormality is seen. Multiple
calcified stones within the gallbladder lumen. No evidence of
gallbladder wall thickening, pericholecystic inflammation, or
biliary dilatation.

Pancreas: Unremarkable. No pancreatic ductal dilatation or
surrounding inflammatory changes.

Spleen: Normal in size without focal abnormality.

Adrenals/Urinary Tract: Adrenal glands are unremarkable. Kidneys are
normal, without renal calculi, focal lesion, or hydronephrosis.
Bladder is unremarkable.

Stomach/Bowel: Stomach is within normal limits. Appendix appears
normal. There are a few scattered diverticula of the sigmoid colon.
No evidence of bowel wall thickening, distention, or inflammatory
changes.

Vascular/Lymphatic: Aortic atherosclerosis. Incidental note of
retroaortic left renal vein. No enlarged abdominal or pelvic lymph
nodes.

Reproductive: Status post hysterectomy. No adnexal masses.

Other: No free fluid within the abdomen or pelvis. Mild rectus
diastasis.

Musculoskeletal: No acute osseous abnormality. Chronic grade 1
anterolisthesis L4 on L5.
IMPRESSION: 1. No acute abdominopelvic findings.
2. Uncomplicated cholelithiasis.
3. Mild sigmoid diverticulosis without acute diverticulitis.
4. Partially visualized 5 mm right lower lobe pulmonary nodule. No
follow-up needed if patient is low-risk. Non-contrast chest CT can
be considered in 12 months if patient is high-risk. This
recommendation follows the consensus statement: Guidelines for
Management of Incidental Pulmonary Nodules Detected on CT Images:

Aortic Atherosclerosis (SE0E7-BRL.L).

## 2021-03-17 IMAGING — NM NM GI BLOOD LOSS
2 series · 12 of 12 positions shown · non-contrast
Comparison: CT yesterday.

CLINICAL DATA: GI bleed.

EXAM:
NUCLEAR MEDICINE GASTROINTESTINAL BLEEDING SCAN
TECHNIQUE: Sequential abdominal images were obtained following intravenous
administration of 4c-22m labeled red blood cells.
RADIOPHARMACEUTICALS:  22.2 mCi 4c-22m pertechnetate in-vitro
labeled red cells.

[Series 1: gi bleed · 4.14mm/px · 6 of 55 frames shown (1 of 2)]
[frame 5/55]
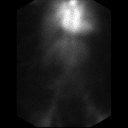
[frame 14/55]
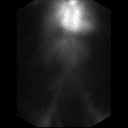
[frame 23/55]
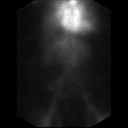
[frame 32/55]
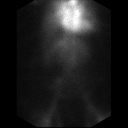
[frame 41/55]
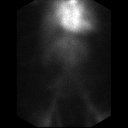
[frame 51/55]
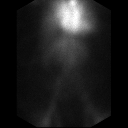

[Series 2: gi bleed · 4.14mm/px · 6 of 60 frames shown (2 of 2)]
[frame 6/60]
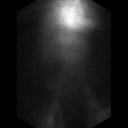
[frame 16/60]
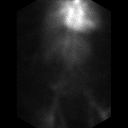
[frame 26/60]
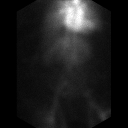
[frame 36/60]
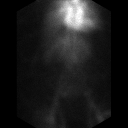
[frame 46/60]
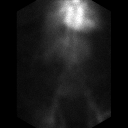
[frame 56/60]
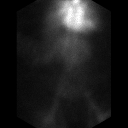

[12 of 12 positions shown; findings below may reference images not displayed]

FINDINGS: Vague focal radiotracer accumulation initially in the central lower
abdomen courses to the right of the level of the iliac vessels,
beginning at minute 22 on first hour imaging. There is to and fro
activity which then dissipates.

During second hour imaging focal activity again accumulates just to
the right of the iliac vessels in the similar location, and courses
centrally., activity than again dissipates.
IMPRESSION: Vague radiotracer accumulation in the central right lower abdomen
with slight to and fro motion that appears intermittently and
dissipates over the course of 2 hours. Findings suggest intermittent
GI bleed. It is unclear if this is within tortuous sigmoid colon
versus small bowel.

These results will be called to the ordering clinician or
representative by the Radiologist Assistant, and communication
documented in the PACS or zVision Dashboard.

## 2021-03-26 ENCOUNTER — Telehealth: Payer: Self-pay

## 2021-03-26 ENCOUNTER — Other Ambulatory Visit: Payer: Self-pay | Admitting: Family Medicine

## 2021-03-26 NOTE — Telephone Encounter (Signed)
Pt called in states that her sugars has been at 70 and drink orange juice and something with sugar in it, reading was 250 after. Pt states that her sugar dropped back down to 70 3 hrs later. Pt would like advice

## 2021-03-26 NOTE — Telephone Encounter (Signed)
Hold glipizide and follow up w Dr. Etter Sjogren next week. Ty.

## 2021-03-29 NOTE — Telephone Encounter (Signed)
Spoke she states that she adjusted her medications and it is back to normal.

## 2021-04-05 ENCOUNTER — Other Ambulatory Visit: Payer: Self-pay

## 2021-04-05 ENCOUNTER — Other Ambulatory Visit (HOSPITAL_BASED_OUTPATIENT_CLINIC_OR_DEPARTMENT_OTHER): Payer: Self-pay | Admitting: Family Medicine

## 2021-04-05 ENCOUNTER — Ambulatory Visit (INDEPENDENT_AMBULATORY_CARE_PROVIDER_SITE_OTHER): Payer: Medicare HMO | Admitting: Family Medicine

## 2021-04-05 ENCOUNTER — Encounter: Payer: Self-pay | Admitting: Family Medicine

## 2021-04-05 VITALS — BP 134/88 | HR 86 | Temp 98.8°F | Resp 20 | Ht 63.0 in | Wt 290.2 lb

## 2021-04-05 DIAGNOSIS — R21 Rash and other nonspecific skin eruption: Secondary | ICD-10-CM | POA: Diagnosis not present

## 2021-04-05 DIAGNOSIS — Z1231 Encounter for screening mammogram for malignant neoplasm of breast: Secondary | ICD-10-CM

## 2021-04-05 DIAGNOSIS — E2839 Other primary ovarian failure: Secondary | ICD-10-CM | POA: Diagnosis not present

## 2021-04-05 DIAGNOSIS — R002 Palpitations: Secondary | ICD-10-CM

## 2021-04-05 DIAGNOSIS — E1165 Type 2 diabetes mellitus with hyperglycemia: Secondary | ICD-10-CM

## 2021-04-05 LAB — COMPREHENSIVE METABOLIC PANEL
ALT: 13 U/L (ref 0–35)
AST: 20 U/L (ref 0–37)
Albumin: 3.6 g/dL (ref 3.5–5.2)
Alkaline Phosphatase: 157 U/L — ABNORMAL HIGH (ref 39–117)
BUN: 12 mg/dL (ref 6–23)
CO2: 30 mEq/L (ref 19–32)
Calcium: 9 mg/dL (ref 8.4–10.5)
Chloride: 100 mEq/L (ref 96–112)
Creatinine, Ser: 1.75 mg/dL — ABNORMAL HIGH (ref 0.40–1.20)
GFR: 29.53 mL/min — ABNORMAL LOW (ref >60.00)
Glucose, Bld: 142 mg/dL — ABNORMAL HIGH (ref 70–99)
Potassium: 3.8 mEq/L (ref 3.5–5.1)
Sodium: 140 mEq/L (ref 135–145)
Total Bilirubin: 0.3 mg/dL (ref 0.2–1.2)
Total Protein: 7.9 g/dL (ref 6.0–8.3)

## 2021-04-05 LAB — LIPID PANEL
Cholesterol: 147 mg/dL (ref 0–200)
HDL: 32 mg/dL — ABNORMAL LOW (ref >39.00)
LDL Cholesterol: 85 mg/dL (ref 0–99)
NonHDL: 114.86
Total CHOL/HDL Ratio: 5
Triglycerides: 150 mg/dL — ABNORMAL HIGH (ref 0.0–149.0)
VLDL: 30 mg/dL (ref 0.0–40.0)

## 2021-04-05 LAB — HEMOGLOBIN A1C: Hgb A1c MFr Bld: 7.5 % — ABNORMAL HIGH (ref 4.6–6.5)

## 2021-04-05 MED ORDER — PREDNISONE 10 MG PO TABS: ORAL_TABLET | ORAL | 0 refills | Status: DC

## 2021-04-05 MED ORDER — METHYLPREDNISOLONE ACETATE 80 MG/ML IJ SUSP
80.0000 mg | Freq: Once | INTRAMUSCULAR | Status: AC
  Administered 2021-04-05: 80 mg via INTRAMUSCULAR

## 2021-04-05 MED ORDER — NOVOLIN N FLEXPEN 100 UNIT/ML ~~LOC~~ SUPN: PEN_INJECTOR | SUBCUTANEOUS | 11 refills | Status: DC

## 2021-04-05 NOTE — Assessment & Plan Note (Signed)
Depo medrol given  pred taper  Pt given ss insulin --- she will check her glucose qid

## 2021-04-05 NOTE — Patient Instructions (Signed)
Check glucose 4x a day -- fasting and 2 hours after each meal  Sliding scale insulin 200-250   2u  251-300 4 u  301-350 6 u  351-400 8 u  >400  10 u and call dr     Rash, Adult A rash is a change in the color of your skin. A rash can also change the way your skin feels. There are many different conditions and factors that can cause a rash. Some rashes may disappear after a few days, but some may last for a few weeks. Common causes of rashes include: Viral infections, such as: Colds. Measles. Hand, foot, and mouth disease. Bacterial infections, such as: Scarlet fever. Impetigo. Fungal infections, such as Candida. Allergic reactions to food, medicines, or skin care products. Follow these instructions at home: The goal of treatment is to stop the itching and keep the rash from spreading. Pay attention to any changes in your symptoms. Follow these instructions tohelp with your condition: Medicine Take or apply over-the-counter and prescription medicines only as told by your health care provider. These may include: Corticosteroid creams to treat red or swollen skin. Anti-itch lotions. Oral allergy medicines (antihistamines). Oral corticosteroids for severe symptoms.  Skin care Apply cool compresses to the affected areas. Do not scratch or rub your skin. Avoid covering the rash. Make sure the rash is exposed to air as much as possible. Managing itching and discomfort Avoid hot showers or baths, which can make itching worse. A cold shower may help. Try taking a bath with: Epsom salts. Follow manufacturer instructions on the packaging. You can get these at your local pharmacy or grocery store. Baking soda. Pour a small amount into the bath as told by your health care provider. Colloidal oatmeal. Follow manufacturer instructions on the packaging. You can get this at your local pharmacy or grocery store. Try applying baking soda paste to your skin. Stir water into baking soda until it  reaches a paste-like consistency. Try applying calamine lotion. This is an over-the-counter lotion that helps to relieve itchiness. Keep cool and out of the sun. Sweating and being hot can make itching worse. General instructions  Rest as needed. Drink enough fluid to keep your urine pale yellow. Wear loose-fitting clothing. Avoid scented soaps, detergents, and perfumes. Use gentle soaps, detergents, perfumes, and other cosmetic products. Avoid any substance that causes your rash. Keep a journal to help track what causes your rash. Write down: What you eat. What cosmetic products you use. What you drink. What you wear. This includes jewelry. Keep all follow-up visits as told by your health care provider. This is important.  Contact a health care provider if: You sweat at night. You lose weight. You urinate more than normal. You urinate less than normal, or you notice that your urine is a darker color than usual. You feel weak. You vomit. Your skin or the whites of your eyes look yellow (jaundice). Your skin: Tingles. Is numb. Your rash: Does not go away after several days. Gets worse. You are: Unusually thirsty. More tired than normal. You have: New symptoms. Pain in your abdomen. A fever. Diarrhea. Get help right away if you: Have a fever and your symptoms suddenly get worse. Develop confusion. Have a severe headache or a stiff neck. Have severe joint pains or stiffness. Have a seizure. Develop a rash that covers all or most of your body. The rash may or may not be painful. Develop blisters that: Are on top of the rash. Grow larger  or grow together. Are painful. Are inside your nose or mouth. Develop a rash that: Looks like purple pinprick-sized spots all over your body. Has a "bull's eye" or looks like a target. Is not related to sun exposure, is red and painful, and causes your skin to peel. Summary A rash is a change in the color of your skin. Some rashes  disappear after a few days, but some may last for a few weeks. The goal of treatment is to stop the itching and keep the rash from spreading. Take or apply over-the-counter and prescription medicines only as told by your health care provider. Contact a health care provider if you have new or worsening symptoms. Keep all follow-up visits as told by your health care provider. This is important. This information is not intended to replace advice given to you by your health care provider. Make sure you discuss any questions you have with your healthcare provider. Document Revised: 12/21/2018 Document Reviewed: 04/02/2018 Elsevier Patient Education  2022 [employer].

## 2021-04-05 NOTE — Assessment & Plan Note (Addendum)
Refer to cardiology  See last ov

## 2021-04-05 NOTE — Progress Notes (Signed)
Patient ID: Sue [name], female    DOB: [birth year]  Age: 70 y.o. MRN: [id number]    Subjective:  Subjective  HPI BARBA SOLT presents for an office visit today accompanied by her daughter. She complains of rash on her bilateral breasts, back, and bilateral UE. She notes that the rash began on her arms and had traveled to her breasts and back. She reports that she has been picking at the rash, since the area itches.  Her daughter notes after the pt had COVID she had clean the house with bleach and Lysol. She states that she had not left the house since June. She denies any other close contact members having similar sxs or changing detergent and/or deodorants.  She also complains of heart palpitations. She describes the palpitations as her heart is dropping. She states that she was unable get appointment with cardio since 02/01/2021.  She reports that her blood sugar level is "crazy". She notes that her at home blood sugar level when she got COVID is 30-70's. She states that she was unable to eat and get her sugar above 70's. After recovering from [place], her at home blood sugar level is 100-130's.  Lab Results  Component Value Date   HGBA1C 7.7 (H) 02/01/2021  She denies any chest pain, SOB, fever, abdominal pain, cough, chills, sore throat, dysuria, urinary incontinence, back pain, HA, or N/V/D at this time.    Review of Systems  Constitutional:  Negative for chills, fatigue and fever.  HENT:  Negative for ear pain, rhinorrhea, sinus pressure, sinus pain, sore throat and tinnitus.   Eyes:  Negative for pain.  Respiratory:  Negative for cough, shortness of breath and wheezing.   Cardiovascular:  Positive for palpitations.  Gastrointestinal:  Negative for abdominal pain, anal bleeding, constipation, diarrhea, nausea and vomiting.  Genitourinary:  Negative for flank pain.  Musculoskeletal:  Negative for back pain and neck pain.  Skin:  Positive for rash (back, arms, breast).   Neurological:  Negative for seizures, weakness, light-headedness, numbness and headaches.   History Past Medical History:  Diagnosis Date   Allergic rhinitis    Depression    Diabetes mellitus type 2, uncontrolled ([place])    Emphysema of lung ([place])    3L home O2   GERD (gastroesophageal reflux disease)    Hypertension    Hypothyroidism    Obesity, morbid, BMI 50 or higher ([place])    Stroke ([place]) 2016   TIA    Urine incontinence     She has a past surgical history that includes Abdominal hysterectomy; Cesarean section; Colonoscopy with propofol (N/A, 08/05/2019); Colonoscopy (N/A, 08/19/2019); Colonoscopy with propofol (N/A, 11/19/2019); Givens capsule study (N/A, 11/19/2019); Hemostasis clip placement (11/19/2019); Submucosal tattoo injection (11/19/2019); Colonoscopy with propofol (N/A, 11/22/2019); Hemostasis clip placement (11/22/2019); Colonoscopy with propofol (N/A, 11/23/2019); Esophagogastroduodenoscopy (egd) with propofol (N/A, 11/24/2019); Hot hemostasis (N/A, 11/24/2019); enteroscopy (N/A, 11/24/2019); Givens capsule study (N/A, 11/24/2019); Esophagogastroduodenoscopy (N/A, 11/27/2019); enteroscopy (N/A, 11/27/2019); Hot hemostasis (N/A, 11/27/2019); Givens capsule study (N/A, 11/28/2019); and Colonoscopy with propofol (N/A, 11/29/2019).   Her family history includes Asthma in her son and son; Bipolar disorder in her father; Depression in her father; Diabetes in her sister; Heart disease in her brother, father, paternal aunt, paternal grandmother, and paternal uncle; Heart disease (age of onset: 62) in her sister; Hyperlipidemia in her sister; Hypertension in her brother, father, and sister; Schizophrenia in her paternal aunt.She reports that she quit smoking about 9 years ago. Her smoking use included cigarettes. She  started smoking about 48 years ago. She has a 40.00 pack-year smoking history. She has never used smokeless tobacco. She reports previous alcohol use. She reports that she does not use  drugs.  Current Outpatient Medications on File Prior to Visit  Medication Sig Dispense Refill   Accu-Chek FastClix Lancets MISC USE TO CHECK BLOOD SUGAR UP TO FOUR TIMES DAILY AS DIRECTED (Patient taking differently: See admin instructions. Up to 4 times a day) 306 each 0   ACCU-CHEK GUIDE test strip USE 1 STRIP TO CHECK GLUCOSE UP TO 4 TIMES DAILY 100 each 0   albuterol (PROVENTIL HFA;VENTOLIN HFA) 108 (90 Base) MCG/ACT inhaler Inhale 1-2 puffs into the lungs every 6 (six) hours as needed for wheezing or shortness of breath.     albuterol (PROVENTIL) (2.5 MG/3ML) 0.083% nebulizer solution Take 3 mLs (2.5 mg total) by nebulization every 6 (six) hours as needed for wheezing or shortness of breath. 150 mL 1   amoxicillin-clavulanate (AUGMENTIN) 875-125 MG tablet Take 1 tablet by mouth 2 (two) times daily. 20 tablet 0   blood glucose meter kit and supplies KIT Dispense based on patient and insurance preference. Use up to four times daily as directed. (FOR ICD-9 250.00, 250.01). 1 each 0   busPIRone (BUSPAR) 15 MG tablet Take 15 mg by mouth at bedtime.   4   chlorpheniramine-HYDROcodone (TUSSIONEX PENNKINETIC ER) 10-8 MG/5ML SUER Take 5 mLs by mouth every 12 (twelve) hours as needed for cough. 115 mL 0   famotidine (PEPCID) 20 MG tablet Take 20 mg by mouth daily as needed for heartburn or indigestion.     fluocinonide-emollient (LIDEX-E) 0.05 % cream Apply 1 application topically 2 (two) times daily. (Patient taking differently: Apply 1 application topically See admin instructions. Apply to rash on buttocks twice a day) 30 g 2   fluticasone (FLONASE) 50 MCG/ACT nasal spray Place 2 sprays into both nostrils daily as needed for allergies.     glipiZIDE (GLUCOTROL) 5 MG tablet TAKE 1 TABLET BY MOUTH TWICE DAILY BEFORE A MEAL OFFICE VISIT NEEDED FOR MORE REFILLS 60 tablet 2   Insulin Syringe-Needle U-100 28G X 5/16" 0.5 ML MISC Use with lantus once a day 100 each 1   LANTUS 100 UNIT/ML injection INJECT 30  UNITS SUBCUTANEOUSLY AT BEDTIME 30 mL 0   levothyroxine (SYNTHROID) 88 MCG tablet Take 1 tablet (88 mcg total) by mouth daily. 90 tablet 3   LORazepam (ATIVAN) 1 MG tablet Take 0.5-1.5 mg by mouth See admin instructions. Take 1-1.5 mg by mouth at bedtime and an additional 0.5 mg once a day as needed for anxiety     Magnesium 400 MG CAPS Take 1 capsule by mouth daily. 30 capsule 0   metFORMIN (GLUCOPHAGE) 500 MG tablet TAKE 2 TABLETS BY MOUTH TWICE DAILY WITH A  MEAL 120 tablet 3   methocarbamol (ROBAXIN) 500 MG tablet Take 1 tablet (500 mg total) by mouth 4 (four) times daily. 45 tablet 1   metoprolol succinate (TOPROL-XL) 50 MG 24 hr tablet Take 2 tablets (100 mg total) by mouth daily. Take with or immediately following a meal. 180 tablet 1   NONFORMULARY OR COMPOUNDED ITEM Pt/ inr   Dx dvt   Tomorrow 04/05/2019  Please call office [phone] with results 1 each 0   NONFORMULARY OR COMPOUNDED ITEM PT/ inr    Dx dvt 1 each 0   NONFORMULARY OR COMPOUNDED ITEM Compression stockings  20-30 mm/hg  #1   Dx low ext edema 1 each  0   olmesartan (BENICAR) 20 MG tablet Take 1 tablet (20 mg total) by mouth daily. 30 tablet 2   OXYGEN Inhale 3-4 L/min into the lungs continuous.      potassium chloride SA (KLOR-CON) 20 MEQ tablet Take 1 tablet by mouth once daily 90 tablet 1   QUEtiapine (SEROQUEL) 100 MG tablet Take 150 mg by mouth at bedtime.      torsemide (DEMADEX) 20 MG tablet Take 2 tablets (40 mg total) by mouth daily. 60 tablet 1   traMADol (ULTRAM) 50 MG tablet Take 1 tablet (50 mg total) by mouth every 6 (six) hours as needed. 15 tablet 0   zolpidem (AMBIEN) 10 MG tablet Take 10 mg by mouth at bedtime.     No current facility-administered medications on file prior to visit.     Objective:  Objective  Physical Exam Vitals and nursing note reviewed.  Constitutional:      General: She is not in acute distress.    Appearance: Normal appearance. She is well-developed. She is not ill-appearing.   HENT:     Head: Normocephalic and atraumatic.     Right Ear: External ear normal.     Left Ear: External ear normal.     Nose: Nose normal.  Eyes:     General:        Right eye: No discharge.        Left eye: No discharge.     Extraocular Movements: Extraocular movements intact.     Pupils: Pupils are equal, round, and reactive to light.  Cardiovascular:     Rate and Rhythm: Normal rate.     Pulses: Normal pulses.     Heart sounds: No murmur heard.   No friction rub. No gallop.  Pulmonary:     Effort: Pulmonary effort is normal. No respiratory distress.     Breath sounds: Normal breath sounds. No stridor. No wheezing, rhonchi or rales.  Chest:     Chest wall: No tenderness.  Abdominal:     General: Bowel sounds are normal. There is no distension.     Palpations: Abdomen is soft. There is no mass.     Tenderness: There is no abdominal tenderness. There is no guarding or rebound.     Hernia: No hernia is present.  Musculoskeletal:        General: Normal range of motion.     Cervical back: Normal range of motion and neck supple.     Right lower leg: No edema.     Left lower leg: No edema.  Skin:    General: Skin is warm and dry.     Findings: Rash present. Rash is papular.     Comments: There is a papular rash present on the bilateral UE, back, and bilateral breast.-- with escoriations   Neurological:     Mental Status: She is alert and oriented to person, place, and time.  Psychiatric:        Behavior: Behavior normal.        Thought Content: Thought content normal.   BP 134/88 (BP Location: Right Arm, Patient Position: Sitting, Cuff Size: Normal)   Pulse 86   Temp 98.8 F (37.1 C) (Oral)   Resp 20   Ht '5\' 3"'  (1.6 m)   Wt 290 lb 3.2 oz (131.6 kg)   SpO2 97%   BMI 51.41 kg/m  Wt Readings from Last 3 Encounters:  04/05/21 290 lb 3.2 oz (131.6 kg)  02/19/21 295 lb (133.8 kg)  02/01/21 (!) 301 lb (136.5 kg)     Lab Results  Component Value Date   WBC 7.2  01/07/2020   HGB 9.3 (L) 01/07/2020   HCT 28.9 (L) 01/07/2020   PLT 327.0 01/07/2020   GLUCOSE 155 (H) 02/01/2021   CHOL 132 02/01/2021   TRIG 121.0 02/01/2021   HDL 33.70 (L) 02/01/2021   LDLCALC 74 02/01/2021   ALT 11 02/01/2021   AST 19 02/01/2021   NA 137 02/01/2021   K 4.2 02/01/2021   CL 100 02/01/2021   CREATININE 1.55 (H) 02/01/2021   BUN 16 02/01/2021   CO2 31 02/01/2021   TSH 2.93 02/01/2021   INR 1.2 01/01/2020   HGBA1C 7.7 (H) 02/01/2021    DG Shoulder Left  Result Date: 05/29/2020 CLINICAL DATA:  Chronic left shoulder pain. EXAM: LEFT SHOULDER - 2+ VIEW COMPARISON:  None. FINDINGS: There is no evidence of fracture or dislocation. There is no evidence of arthropathy or other focal bone abnormality. Soft tissues are unremarkable. IMPRESSION: Negative. Electronically Signed   By: Marijo Conception M.D.   On: 05/29/2020 08:19     Assessment & Plan:  Plan    Meds ordered this encounter  Medications   Insulin NPH, Human,, Isophane, (NOVOLIN N FLEXPEN) 100 UNIT/ML Kiwkpen    Sig: Sliding scale as directed --- no more than qid and max 10 u each time    Dispense:  15 mL    Refill:  11   predniSONE (DELTASONE) 10 MG tablet    Sig: TAKE 3 TABLETS PO QD FOR 3 DAYS THEN TAKE 2 TABLETS PO QD FOR 3 DAYS THEN TAKE 1 TABLET PO QD FOR 3 DAYS THEN TAKE 1/2 TAB PO QD FOR 3 DAYS    Dispense:  20 tablet    Refill:  0   methylPREDNISolone acetate (DEPO-MEDROL) injection 80 mg    Problem List Items Addressed This Visit       Unprioritized   Diabetes mellitus type II, uncontrolled ([facility]) (Chronic)   Relevant Medications   Insulin NPH, Human,, Isophane, (NOVOLIN N FLEXPEN) 100 UNIT/ML Kiwkpen   Other Relevant Orders   Lipid panel   Hemoglobin A1c   Comprehensive metabolic panel   Palpitation   Relevant Orders   Ambulatory referral to Cardiology   Palpitations    Refer to cardiology  See last ov         Rash - Primary    Depo medrol given  pred taper  Pt given ss  insulin --- she will check her glucose qid        Relevant Medications   predniSONE (DELTASONE) 10 MG tablet   Other Visit Diagnoses     Estrogen deficiency       Relevant Orders   DG Bone Density       Follow-up: Return in about 6 months (around 10/06/2021), or if symptoms worsen or fail to improve.   I,Gordon Zheng,acting as a Education administrator for [employer], DO.,have documented all relevant documentation on the behalf of Ann Held, DO,as directed by  Ann Held, DO while in the presence of [place], DO, have reviewed all documentation for this visit. The documentation on 04/05/21 for the exam, diagnosis, procedures, and orders are all accurate and complete.

## 2021-04-09 ENCOUNTER — Other Ambulatory Visit: Payer: Self-pay

## 2021-04-09 DIAGNOSIS — N1832 Chronic kidney disease, stage 3b: Secondary | ICD-10-CM

## 2021-04-09 DIAGNOSIS — E1165 Type 2 diabetes mellitus with hyperglycemia: Secondary | ICD-10-CM

## 2021-04-09 MED ORDER — GLIPIZIDE 10 MG PO TABS: ORAL_TABLET | ORAL | 2 refills | Status: DC

## 2021-04-10 ENCOUNTER — Other Ambulatory Visit: Payer: Self-pay | Admitting: Family Medicine

## 2021-04-10 DIAGNOSIS — R600 Localized edema: Secondary | ICD-10-CM

## 2021-04-12 ENCOUNTER — Other Ambulatory Visit: Payer: Self-pay

## 2021-04-12 NOTE — Telephone Encounter (Signed)
The pt has called to get her med refill of the Torsemide. I have seen it in the RX refill basket and have filled it from there. Pt was last seen on 04/05/21. Pt pharmacy has been confirmed.

## 2021-05-03 ENCOUNTER — Inpatient Hospital Stay (HOSPITAL_BASED_OUTPATIENT_CLINIC_OR_DEPARTMENT_OTHER): Admission: RE | Admit: 2021-05-03 | Payer: Medicare HMO | Source: Ambulatory Visit

## 2021-05-03 ENCOUNTER — Other Ambulatory Visit (HOSPITAL_BASED_OUTPATIENT_CLINIC_OR_DEPARTMENT_OTHER): Payer: Medicare HMO

## 2021-05-10 ENCOUNTER — Ambulatory Visit: Payer: Medicare HMO | Admitting: Family Medicine

## 2021-05-10 DIAGNOSIS — R32 Unspecified urinary incontinence: Secondary | ICD-10-CM | POA: Insufficient documentation

## 2021-05-12 ENCOUNTER — Telehealth: Payer: Self-pay | Admitting: Family Medicine

## 2021-05-12 DIAGNOSIS — J9601 Acute respiratory failure with hypoxia: Secondary | ICD-10-CM | POA: Diagnosis not present

## 2021-05-12 DIAGNOSIS — J449 Chronic obstructive pulmonary disease, unspecified: Secondary | ICD-10-CM | POA: Diagnosis not present

## 2021-05-12 NOTE — Telephone Encounter (Signed)
Left message for patient to call back and schedule Medicare Annual Wellness Visit (AWV) in office.  ° °If not able to come in office, please offer to do virtually or by telephone.  Left office number and my jabber #[phone]. ° °Due for AWVI ° °Please schedule at anytime with Nurse Health Advisor. °  °

## 2021-05-17 ENCOUNTER — Other Ambulatory Visit: Payer: Self-pay | Admitting: Family Medicine

## 2021-05-17 DIAGNOSIS — R600 Localized edema: Secondary | ICD-10-CM

## 2021-05-18 ENCOUNTER — Encounter: Payer: Self-pay | Admitting: Cardiology

## 2021-05-18 ENCOUNTER — Other Ambulatory Visit: Payer: Self-pay

## 2021-05-18 ENCOUNTER — Ambulatory Visit: Payer: Medicare HMO | Admitting: Cardiology

## 2021-05-18 ENCOUNTER — Ambulatory Visit: Payer: Medicare HMO

## 2021-05-18 ENCOUNTER — Encounter: Payer: Self-pay | Admitting: Internal Medicine

## 2021-05-18 ENCOUNTER — Ambulatory Visit (INDEPENDENT_AMBULATORY_CARE_PROVIDER_SITE_OTHER): Payer: Medicare HMO | Admitting: Internal Medicine

## 2021-05-18 VITALS — BP 130/82 | HR 85 | Ht 63.0 in | Wt 291.8 lb

## 2021-05-18 VITALS — BP 176/104 | HR 93 | Ht 63.0 in | Wt 292.1 lb

## 2021-05-18 DIAGNOSIS — E1122 Type 2 diabetes mellitus with diabetic chronic kidney disease: Secondary | ICD-10-CM | POA: Diagnosis not present

## 2021-05-18 DIAGNOSIS — R002 Palpitations: Secondary | ICD-10-CM

## 2021-05-18 DIAGNOSIS — R0600 Dyspnea, unspecified: Secondary | ICD-10-CM

## 2021-05-18 DIAGNOSIS — I10 Essential (primary) hypertension: Secondary | ICD-10-CM

## 2021-05-18 DIAGNOSIS — E1142 Type 2 diabetes mellitus with diabetic polyneuropathy: Secondary | ICD-10-CM

## 2021-05-18 DIAGNOSIS — R9431 Abnormal electrocardiogram [ECG] [EKG]: Secondary | ICD-10-CM

## 2021-05-18 DIAGNOSIS — Z794 Long term (current) use of insulin: Secondary | ICD-10-CM | POA: Diagnosis not present

## 2021-05-18 DIAGNOSIS — E039 Hypothyroidism, unspecified: Secondary | ICD-10-CM | POA: Diagnosis not present

## 2021-05-18 DIAGNOSIS — I493 Ventricular premature depolarization: Secondary | ICD-10-CM

## 2021-05-18 DIAGNOSIS — R0609 Other forms of dyspnea: Secondary | ICD-10-CM

## 2021-05-18 DIAGNOSIS — N1832 Chronic kidney disease, stage 3b: Secondary | ICD-10-CM | POA: Diagnosis not present

## 2021-05-18 DIAGNOSIS — E1165 Type 2 diabetes mellitus with hyperglycemia: Secondary | ICD-10-CM | POA: Diagnosis not present

## 2021-05-18 LAB — GLUCOSE, POCT (MANUAL RESULT ENTRY): POC Glucose: 153 mg/dl — AB (ref 70–99)

## 2021-05-18 MED ORDER — INSULIN PEN NEEDLE 29G X 8MM MISC: 1.0000 | Freq: Every day | 3 refills | Status: DC

## 2021-05-18 MED ORDER — LEVOTHYROXINE SODIUM 88 MCG PO TABS: 88.0000 ug | ORAL_TABLET | Freq: Every day | ORAL | 3 refills | Status: DC

## 2021-05-18 MED ORDER — TRULICITY 0.75 MG/0.5ML ~~LOC~~ SOAJ: 0.7500 mg | SUBCUTANEOUS | 3 refills | Status: DC

## 2021-05-18 MED ORDER — HYDRALAZINE HCL 25 MG PO TABS: 12.5000 mg | ORAL_TABLET | Freq: Three times a day (TID) | ORAL | 3 refills | Status: DC

## 2021-05-18 MED ORDER — TOUJEO SOLOSTAR 300 UNIT/ML ~~LOC~~ SOPN: 40.0000 [IU] | PEN_INJECTOR | Freq: Every day | SUBCUTANEOUS | 3 refills | Status: DC

## 2021-05-18 NOTE — Progress Notes (Signed)
Cardiology Office Note:    Date:  05/18/2021   ID:  JAIME DOME, DOB 1951/04/11, MRN [id number]  PCP:  Carollee Herter, Alferd Apa, DO  Cardiologist:  Berniece Salines, DO  Electrophysiologist:  None   Referring MD: Carollee Herter, Alferd Apa, *   " I am experiencing shortness of breath and palpitations"  History of Present Illness:    Sue [name] is a 70 y.o. female with a hx of diabetes mellitus follows with endocrinology, emphysema, chronic respiratory failure on 3 L nasal cannula, hypertension, morbid obesity, history of CVA, family history of premature coronary artery disease.  The patient tells me that recently she noticed that she has been experiencing significant palpitations feels heart skipping beats as if she is having more PVCs and PACs.  She notes that it is happening almost every day.  In addition she tells me that shortness of breath is also an issue she denies any chest discomfort.  Is concerned about the shortness of breath.  The issue that she was concerned about was the fact that she went to see her PCP and she was worried that her the EKG reading noted atrial flutter.  No other complaints at this time.  Past Medical History:  Diagnosis Date   Allergic rhinitis    Depression    Diabetes mellitus type 2, uncontrolled ([place])    Emphysema of lung ([place])    3L home O2   GERD (gastroesophageal reflux disease)    Hypertension    Hypothyroidism    Obesity, morbid, BMI 50 or higher ([place])    Stroke ([place]) 2016   TIA    Urine incontinence     Past Surgical History:  Procedure Laterality Date   ABDOMINAL HYSTERECTOMY     CESAREAN SECTION     COLONOSCOPY N/A 08/19/2019   Procedure: COLONOSCOPY;  Surgeon: Clarene Essex, MD;  [facility]: [facility];  Service: Endoscopy;  Laterality: N/A;   COLONOSCOPY WITH PROPOFOL N/A 08/05/2019   Procedure: COLONOSCOPY WITH PROPOFOL;  Surgeon: Wilford Corner, MD;  [facility];  Service: Gastroenterology;  Laterality: N/A;   COLONOSCOPY  WITH PROPOFOL N/A 11/19/2019   Procedure: COLONOSCOPY WITH PROPOFOL;  Surgeon: Ronald Lobo, MD;  [facility];  Service: Endoscopy;  Laterality: N/A;  Unprepped   COLONOSCOPY WITH PROPOFOL N/A 11/22/2019   Procedure: COLONOSCOPY WITH PROPOFOL;  Surgeon: Ronald Lobo, MD;  [facility];  Service: Endoscopy;  Laterality: N/A;   COLONOSCOPY WITH PROPOFOL N/A 11/23/2019   Procedure: COLONOSCOPY WITH PROPOFOL;  Surgeon: [name], MD;  [facility];  Service: Gastroenterology;  Laterality: N/A;   COLONOSCOPY WITH PROPOFOL N/A 11/29/2019   Procedure: COLONOSCOPY WITH PROPOFOL;  Surgeon: Wilford Corner, MD;  [facility];  Service: Endoscopy;  Laterality: N/A;   ENTEROSCOPY N/A 11/24/2019   Procedure: ENTEROSCOPY;  Surgeon: [name], MD;  [facility];  Service: Gastroenterology;  Laterality: N/A;   ENTEROSCOPY N/A 11/27/2019   Procedure: ENTEROSCOPY;  Surgeon: Wilford Corner, MD;  [facility];  Service: Endoscopy;  Laterality: N/A;   ESOPHAGOGASTRODUODENOSCOPY N/A 11/27/2019   Procedure: ESOPHAGOGASTRODUODENOSCOPY (EGD);  Surgeon: Wilford Corner, MD;  [facility];  Service: Endoscopy;  Laterality: N/A;   ESOPHAGOGASTRODUODENOSCOPY (EGD) WITH PROPOFOL N/A 11/24/2019   Procedure: ESOPHAGOGASTRODUODENOSCOPY (EGD) WITH PROPOFOL;  Surgeon: [name], MD;  [facility];  Service: Gastroenterology;  Laterality: N/A;  PUSH enteroscopy   GIVENS CAPSULE STUDY N/A 11/19/2019   Procedure: GIVENS CAPSULE STUDY;  Surgeon: Ronald Lobo, MD;  Location: [facility];  Service: Endoscopy;  Laterality: N/A;  To be performed immediately following colonoscopy   GIVENS CAPSULE STUDY N/A 11/24/2019   Procedure: GIVENS CAPSULE STUDY;  Surgeon: [name], MD;  [facility];  Service: Gastroenterology;  Laterality: N/A;   GIVENS CAPSULE STUDY N/A 11/28/2019   Procedure: GIVENS CAPSULE STUDY;  Surgeon: Wilford Corner,  MD;  [facility];  Service: Endoscopy;  Laterality: N/A;   HEMOSTASIS CLIP PLACEMENT  11/19/2019   Procedure: HEMOSTASIS CLIP PLACEMENT;  Surgeon: Ronald Lobo, MD;  [facility];  Service: Endoscopy;;   HEMOSTASIS CLIP PLACEMENT  11/22/2019   Procedure: HEMOSTASIS CLIP PLACEMENT;  Surgeon: Ronald Lobo, MD;  [facility];  Service: Endoscopy;;   HOT HEMOSTASIS N/A 11/24/2019   Procedure: HOT HEMOSTASIS (ARGON PLASMA COAGULATION/BICAP);  Surgeon: [name], MD;  Location: [facility];  Service: Gastroenterology;  Laterality: N/A;   HOT HEMOSTASIS N/A 11/27/2019   Procedure: HOT HEMOSTASIS (ARGON PLASMA COAGULATION/BICAP);  Surgeon: Wilford Corner, MD;  Location: [facility];  Service: Endoscopy;  Laterality: N/A;   SUBMUCOSAL TATTOO INJECTION  11/19/2019   Procedure: SUBMUCOSAL TATTOO INJECTION;  Surgeon: Ronald Lobo, MD;  [facility];  Service: Endoscopy;;    Current Medications: Current Meds  Medication Sig   Accu-Chek FastClix Lancets MISC USE TO CHECK BLOOD SUGAR UP TO FOUR TIMES DAILY AS DIRECTED (Patient taking differently: See admin instructions. Up to 4 times a day)   ACCU-CHEK GUIDE test strip USE 1 STRIP TO CHECK GLUCOSE UP TO 4 TIMES DAILY   albuterol (PROVENTIL HFA;VENTOLIN HFA) 108 (90 Base) MCG/ACT inhaler Inhale 1-2 puffs into the lungs every 6 (six) hours as needed for wheezing or shortness of breath.   albuterol (PROVENTIL) (2.5 MG/3ML) 0.083% nebulizer solution Take 3 mLs (2.5 mg total) by nebulization every 6 (six) hours as needed for wheezing or shortness of breath.   blood glucose meter kit and supplies KIT Dispense based on patient and insurance preference. Use up to four times daily as directed. (FOR ICD-9 250.00, 250.01).   busPIRone (BUSPAR) 15 MG tablet Take 15 mg by mouth at bedtime.    Dulaglutide (TRULICITY) 9.51 OA/4.1YS SOPN Inject 0.75 mg into the skin once a week.   famotidine (PEPCID) 20 MG tablet Take 20 mg  by mouth daily as needed for heartburn or indigestion.   hydrALAZINE (APRESOLINE) 25 MG tablet Take 0.5 tablets (12.5 mg total) by mouth 3 (three) times daily.   insulin glargine, 1 Unit Dial, (TOUJEO SOLOSTAR) 300 UNIT/ML Solostar Pen Inject 40 Units into the skin daily in the afternoon.   Insulin Pen Needle 29G X 8MM MISC 1 Device by Does not apply route daily in the afternoon.   levothyroxine (SYNTHROID) 88 MCG tablet Take 1 tablet (88 mcg total) by mouth daily.   LORazepam (ATIVAN) 1 MG tablet Take 0.5-1.5 mg by mouth See admin instructions. Take 1-1.5 mg by mouth at bedtime and an additional 0.5 mg once a day as needed for anxiety   Magnesium 400 MG CAPS Take 1 capsule by mouth daily.   metoprolol succinate (TOPROL-XL) 50 MG 24 hr tablet Take 2 tablets (100 mg total) by mouth daily. Take with or immediately following a meal.   NONFORMULARY OR COMPOUNDED ITEM Pt/ inr   Dx dvt   Tomorrow 04/05/2019  Please call office [phone] with results   NONFORMULARY OR COMPOUNDED ITEM PT/ inr    Dx dvt   NONFORMULARY OR COMPOUNDED ITEM Compression stockings  20-30 mm/hg  #1   Dx  low ext edema   OXYGEN Inhale 3-4 L/min into the lungs continuous.    potassium chloride SA (KLOR-CON) 20 MEQ tablet Take 1 tablet by mouth once daily   QUEtiapine (SEROQUEL) 100 MG tablet Take 150 mg by mouth at bedtime.   torsemide (DEMADEX) 20 MG tablet Take 2 tablets by mouth once daily   zolpidem (AMBIEN) 10 MG tablet Take 10 mg by mouth at bedtime.     Allergies:   Hydrocodone, Norvasc [amlodipine besylate], and Tizanidine   Social History   Socioeconomic History   Marital status: Divorced    Spouse name: Not on file   Number of children: Not on file   Years of education: Not on file   Highest education level: Not on file  Occupational History   Occupation: disabled- [employer]  Tobacco Use   Smoking status: Former    Packs/day: 1.00    Years: 40.00    Pack years: 40.00    Types: Cigarettes    Start  date: 07/21/1972    Quit date: 10/16/2011    Years since quitting: 9.5   Smokeless tobacco: Never  Vaping Use   Vaping Use: Never used  Substance and Sexual Activity   Alcohol use: Not Currently    Comment: Occ-- Wine   Drug use: No   Sexual activity: Not on file  Other Topics Concern   Not on file  Social History Narrative   [facility] (04/06/17):   Originally from [place]. Has also lived in [place], [place]. She also previously lived in [place] for 20 years. No history of [place]. Moved to [place] in 1989. 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 No pets currently. No bird exposure. Questionable previous mold exposure in her daughter's home. 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    Social Determinants of Health   Financial Resource Strain: Not on file  Food Insecurity: Not on file  Transportation Needs: Not on file  Physical Activity: Not on file  Stress: Not on file  Social Connections: Not on file     Family History: The patient's family history includes Asthma in her son and son; Bipolar disorder in her father; Depression in her father; Diabetes in her sister; Heart disease in her brother, father, paternal aunt, paternal grandmother, and paternal uncle; Heart disease (age of onset: 103) in her sister; Hyperlipidemia in her sister; Hypertension in her brother, father, and sister; Schizophrenia in her paternal aunt.  ROS:   Review of Systems  Constitution: Negative for decreased appetite, fever and weight gain.  HENT: Negative for congestion, ear discharge, hoarse voice and sore throat.   Eyes: Negative for discharge, redness, vision loss in right eye and visual halos.  Cardiovascular: Negative for chest pain, dyspnea on exertion, leg swelling, orthopnea and palpitations.   Respiratory: Negative for cough, hemoptysis, shortness of breath and snoring.   Endocrine: Negative for heat intolerance and polyphagia.  Hematologic/Lymphatic: Negative for bleeding problem. Does not bruise/bleed easily.  Skin: Negative for flushing, nail changes, rash and suspicious lesions.  Musculoskeletal: Negative for arthritis, joint pain, muscle cramps, myalgias, neck pain and stiffness.  Gastrointestinal: Negative for abdominal pain, bowel incontinence, diarrhea and excessive appetite.  Genitourinary: Negative for decreased libido, genital sores and incomplete emptying.  Neurological: Negative for brief paralysis,  focal weakness, headaches and loss of balance.  Psychiatric/Behavioral: Negative for altered mental status, depression and suicidal ideas.  Allergic/Immunologic: Negative for HIV exposure and persistent infections.    EKGs/Labs/Other Studies Reviewed:    The following studies were reviewed today:   EKG:  The ekg ordered today demonstrates sinus rhythm, heart rate 93 bpm with Q waves in the inferior leads as well as changes in the precordial leads suggesting posterior/inferior  wall infarction of age-indeterminate.  Echocardiogram 2018 Study Conclusions   - Left ventricle: Proximal septal thickening The cavity size was    normal. Wall thickness was increased in a pattern of mild LVH.    Systolic function was vigorous. The estimated ejection fraction    was in the range of 65% to 70%. Doppler parameters are consistent    with abnormal left ventricular relaxation (grade 1 diastolic    dysfunction).   -------------------------------------------------------------------  Study data:  Comparison was made to the study of 07/30/2014.  Study  status:  Routine.  Procedure:  The patient reported no pain pre or  post test. Transthoracic echocardiography. Image quality was  adequate.  Study completion:  There were no complications.  Transthoracic echocardiography.  M-mode,  complete 2D, spectral  Doppler, and color Doppler.  Birthdate:  Patient birthdate:  31-Mar-1951.  Age:  Patient is 70 yr old.  Sex:  Gender: female.  BMI: 52.6 kg/m^2.  Blood pressure:     122/76  Patient status:  Outpatient.  Study date:  Study date: 04/10/2017. Study time: 09:35  AM.  Location:  [facility]   -------------------------------------------------------------------   -------------------------------------------------------------------  Left ventricle:  Proximal septal thickening The cavity size was  normal. Wall thickness was increased in a pattern of mild LVH.  Systolic function was vigorous. The estimated ejection fraction was  in the range of 65% to 70%. Doppler parameters are consistent with  abnormal left ventricular relaxation (grade 1 diastolic  dysfunction).   -------------------------------------------------------------------  Aortic valve:   Structurally normal valve.   Cusp separation was  normal.  Doppler:  Transvalvular velocity was within the normal  range. There was no stenosis. There was no regurgitation.   -------------------------------------------------------------------  Mitral valve:   Mildly thickened leaflets .  Doppler:  There was no  significant regurgitation.   -------------------------------------------------------------------  Left atrium:  The atrium was normal in size.   -------------------------------------------------------------------  Right ventricle:  The cavity size was normal. Wall thickness was  normal. Systolic function was normal.   -------------------------------------------------------------------  Pulmonic valve:    The valve appears to be grossly normal.   -------------------------------------------------------------------  Tricuspid valve:   Structurally normal valve.   Leaflet separation  was normal.  Doppler:  Transvalvular velocity was within the normal  range. There was trivial regurgitation.    -------------------------------------------------------------------  Right atrium:  The atrium was normal in size.   -------------------------------------------------------------------  Pericardium:  There was no pericardial effusion.   Recent Labs: 02/01/2021: TSH 2.93 04/05/2021: ALT 13; BUN 12; Creatinine, Ser 1.75; Potassium 3.8; Sodium 140  Recent Lipid Panel    Component Value Date/Time   CHOL 147 04/05/2021 1146   TRIG 150.0 (H) 04/05/2021 1146   HDL 32.00 (L) 04/05/2021 1146   CHOLHDL 5 04/05/2021 1146   VLDL 30.0 04/05/2021 1146   LDLCALC 85 04/05/2021 1146   LDLCALC 94 05/28/2020 1153    Physical Exam:    VS:  BP (!) 176/104 (BP Location: Right Wrist, Patient Position: Sitting)   Pulse 93   Ht '5\' 3"'  (  1.6 m)   Wt 292 lb 1.3 oz (132.5 kg)   SpO2 94%   BMI 51.74 kg/m     Wt Readings from Last 3 Encounters:  05/18/21 292 lb 1.3 oz (132.5 kg)  05/18/21 291 lb 12.8 oz (132.4 kg)  04/05/21 290 lb 3.2 oz (131.6 kg)     GEN: Well nourished, well developed in no acute distress HEENT: Normal NECK: No JVD; No carotid bruits LYMPHATICS: No lymphadenopathy CARDIAC: S1S2 noted,RRR, no murmurs, rubs, gallops RESPIRATORY:  Clear to auscultation without rales, wheezing or rhonchi  ABDOMEN: Soft, non-tender, non-distended, +bowel sounds, no guarding. EXTREMITIES: No edema, No cyanosis, no clubbing MUSCULOSKELETAL:  No deformity  SKIN: Warm and dry NEUROLOGIC:  Alert and oriented x 3, non-focal PSYCHIATRIC:  Normal affect, good insight  ASSESSMENT:    1. PVC (premature ventricular contraction)   2. Nonspecific abnormal electrocardiogram (ECG) (EKG)   3. DOE (dyspnea on exertion)   4. Palpitations   5. Hypertension, unspecified type   6. Morbid obesity (HCC)    PLAN:     The symptoms of dyspnea on exertion could certainly be multifactorial with contributing her chronic respiratory failure but this is slightly worse than her baseline and she does have risk factors  along with an abnormal EKG therefore is concerning, this patient does have intermediate risk for coronary artery disease and at this time I would like to pursue an ischemic evaluation in this patient.  Shared decision a coronary CTA at this time is appropriate.  I have discussed with the patient about the testing.  The patient has no IV contrast allergy and is agreeable to proceed with this test.  I reviewed her prior EKG we did not show any evidence of atrial flutter she was in sinus rhythm with rare PVCs, I did reassure the patient and her daughter about this.  She has hypertensive in the office today.  She has been taken off her Benicar and the patient is not sure why.  She has referral for nephrology.  Her creatinine is 1.75 we will hold off on adding ARB until she see nephrology.  We will start hydralazine 12.5 mg every 8 hours.  She did have grade 1 diastolic dysfunction on her prior echo at this time we will like to reassess LV function, diastolic function as well as any valvular abnormalities.  The patient understands the need to lose weight with diet and exercise. We have discussed specific strategies for this.  Diabetes mellitus is being managed by her endocrinologist.  I would like to rule out a cardiovascular etiology of this palpitation, therefore at this time I would like to placed a zio patch for  14  days. In additon as noted above a transthoracic echocardiogram will be ordered to assess LV/RV function and any structural abnormalities. Once these testing have been performed amd reviewed further reccomendations will be made.   The patient is in agreement with the above plan. The patient left the office in stable condition.  The patient will follow up in 12 weeks in the [place] office.   Medication Adjustments/Labs and Tests Ordered: Current medicines are reviewed at length with the patient today.  Concerns regarding medicines are outlined above.  Orders Placed This Encounter   Procedures   CT CORONARY MORPH W/CTA COR W/SCORE W/CA W/CM &/OR WO/CM   Basic Metabolic Panel (BMET)   Magnesium   LONG TERM MONITOR (3-14 DAYS)   EKG 12-Lead   ECHOCARDIOGRAM COMPLETE   Meds ordered this encounter  Medications   hydrALAZINE (APRESOLINE) 25 MG tablet    Sig: Take 0.5 tablets (12.5 mg total) by mouth 3 (three) times daily.    Dispense:  135 tablet    Refill:  3    Patient Instructions  Medication Instructions:  Your physician has recommended you make the following change in your medication.  START: Hydralazine 12.5 mg every 8 hours *If you need a refill on your cardiac medications before your next appointment, please call your pharmacy*   Lab Work: Your physician recommends that you return for lab work in:  3-7 days before CT scan:  BMET, Mag If you have labs (blood work) drawn today and your tests are completely normal, you will receive your results only by: [place] (if you have MyChart) OR A paper copy in the mail If you have any lab test that is abnormal or we need to change your treatment, we will call you to review the results.   Testing/Procedures:   Your cardiac CT will be scheduled at one of the below locations:   [facility] [address] [place], [place] [address] [phone]    If scheduled at [facility], please arrive at the [facility] main entrance (entrance A) of [facility] 30 minutes prior to test start time. Proceed to the [facility] Radiology Department (first floor) to check-in and test prep.   Please follow these instructions carefully (unless otherwise directed):    On the Night Before the Test: Be sure to Drink plenty of water. Do not consume any caffeinated/decaffeinated beverages or chocolate 12 hours prior to your test. Do not take any antihistamines 12 hours prior to your test.   On the Day of the Test: Drink plenty of water until 1 hour prior to the test. Do not eat any  food 4 hours prior to the test. You may take your regular medications prior to the test.  Take metoprolol two hours prior to test. FEMALES- please wear underwire-free bra if available, avoid dresses & tight clothing       After the Test: Drink plenty of water. After receiving IV contrast, you may experience a mild flushed feeling. This is normal. On occasion, you may experience a mild rash up to 24 hours after the test. This is not dangerous. If this occurs, you can take Benadryl 25 mg and increase your fluid intake. If you experience trouble breathing, this can be serious. If it is severe call 911 IMMEDIATELY. If it is mild, please call our office. If you take any of these medications: Glipizide/Metformin, Avandament, Glucavance, please do not take 48 hours after completing test unless otherwise instructed.  Please allow 2-4 weeks for scheduling of routine cardiac CTs. Some insurance companies require a pre-authorization which may delay scheduling of this test.   For non-scheduling related questions, please contact the cardiac imaging nurse navigator should you have any questions/concerns: Marchia Bond, Cardiac Imaging Nurse Navigator Gordy Clement, Cardiac Imaging Nurse Navigator [facility] Direct Office Dial: [phone]   For scheduling needs, including cancellations and rescheduling, please call [place], [phone].  A zio monitor was ordered today. It will remain on for 14 days. You will then return monitor and event diary in provided box. It takes 1-2 weeks for report to be downloaded and returned to [place]. We will call you with the results. If monitor falls off or has orange flashing light, please call Zio for further instructions.   ZIO  WHY IS  MY DOCTOR PRESCRIBING ZIO? The Zio system is proven and trusted by physicians to detect and diagnose irregular heart rhythms -- and has been prescribed to hundreds of thousands of patients.  The FDA has  cleared the Zio system to monitor for many different kinds of irregular heart rhythms. In a study, physicians were able to reach a diagnosis 90% of the time with the Zio system1.  You can wear the Zio monitor -- a small, discreet, comfortable patch -- during your normal day-to-day activity, including while you sleep, shower, and exercise, while it records every single heartbeat for analysis.  1Barrett, P., et al. Comparison of 24 Hour Holter Monitoring Versus 14 Day Novel Adhesive Patch Electrocardiographic Monitoring. [address], 2014.  ZIO VS. HOLTER MONITORING The Zio monitor can be comfortably worn for up to 14 days. Holter monitors can be worn for 24 to 48 hours, limiting the time to record any irregular heart rhythms you may have. Zio is able to capture data for the 51% of patients who have their first symptom-triggered arrhythmia after 48 hours.1  LIVE WITHOUT RESTRICTIONS The Zio ambulatory cardiac monitor is a small, unobtrusive, and water-resistant patch--you might even forget you're wearing it. The Zio monitor records and stores every beat of your heart, whether you're sleeping, working out, or showering. Remove 14 days after application.   Your physician has requested that you have an echocardiogram. Echocardiography is a painless test that uses sound waves to create images of your heart. It provides your doctor with information about the size and shape of your heart and how well your heart's chambers and valves are working. This procedure takes approximately one hour. There are no restrictions for this procedure.   Follow-Up: At [facility], you and your health needs are our priority.  As part of our continuing mission to provide you with exceptional heart care, we have created designated Provider Care Teams.  These Care Teams include your primary Cardiologist (physician) and Advanced Practice Providers (APPs -  Physician Assistants and Nurse Practitioners) who all  work together to provide you with the care you need, when you need it.  We recommend signing up for the patient portal called "MyChart".  Sign up information is provided on this After Visit Summary.  MyChart is used to connect with patients for Virtual Visits (Telemedicine).  Patients are able to view lab/test results, encounter notes, upcoming appointments, etc.  Non-urgent messages can be sent to your provider as well.   To learn more about what you can do with MyChart, go to [email or web address].    Your next appointment:   12 week(s)  The format for your next appointment:   In Person  Provider:   [id number], [place], [place] [address]    Other Instructions  Echocardiogram An echocardiogram is a test that uses sound waves (ultrasound) to produce images of the heart. Images from an echocardiogram can provide important information about: Heart size and shape. The size and thickness and movement of your heart's walls. Heart muscle function and strength. Heart valve function or if you have stenosis. Stenosis is when the heart valves are too narrow. If blood is flowing backward through the heart valves (regurgitation). A tumor or infectious growth around the heart valves. Areas of heart muscle that are not working well because of poor blood flow or injury from a heart attack. Aneurysm detection. An aneurysm is a weak or damaged part of an artery wall. The wall  bulges out from the normal force of blood pumping through the body. Tell a health care provider about: Any allergies you have. All medicines you are taking, including vitamins, herbs, eye drops, creams, and over-the-counter medicines. Any blood disorders you have. Any surgeries you have had. Any medical conditions you have. Whether you are pregnant or may be pregnant. What are the risks? Generally, this is a safe test. However, problems may occur, including an allergic reaction to dye  (contrast) that may be used during the test. What happens before the test? No specific preparation is needed. You may eat and drink normally. What happens during the test?  You will take off your clothes from the waist up and put on a hospital gown. Electrodes or electrocardiogram (ECG)patches may be placed on your chest. The electrodes or patches are then connected to a device that monitors your heart rate and rhythm. You will lie down on a table for an ultrasound exam. A gel will be applied to your chest to help sound waves pass through your skin. A handheld device, called a transducer, will be pressed against your chest and moved over your heart. The transducer produces sound waves that travel to your heart and bounce back (or "echo" back) to the transducer. These sound waves will be captured in real-time and changed into images of your heart that can be viewed on a video monitor. The images will be recorded on a computer and reviewed by your health care provider. You may be asked to change positions or hold your breath for a short time. This makes it easier to get different views or better views of your heart. In some cases, you may receive contrast through an IV in one of your veins. This can improve the quality of the pictures from your heart. The procedure may vary among health care providers and hospitals. What can I expect after the test? You may return to your normal, everyday life, including diet, activities, and medicines, unless your health care provider tells you not to do that. Follow these instructions at home: It is up to you to get the results of your test. Ask your health care provider, or the department that is doing the test, when your results will be ready. Keep all follow-up visits. This is important. Summary An echocardiogram is a test that uses sound waves (ultrasound) to produce images of the heart. Images from an echocardiogram can provide important information about the  size and shape of your heart, heart muscle function, heart valve function, and other possible heart problems. You do not need to do anything to prepare before this test. You may eat and drink normally. After the echocardiogram is completed, you may return to your normal, everyday life, unless your health care provider tells you not to do that. This information is not intended to replace advice given to you by your health care provider. Make sure you discuss any questions you have with your health care provider. Document Revised: 04/21/2020 Document Reviewed: 04/21/2020 Elsevier Patient Education  2022 [place].   Adopting a Healthy Lifestyle.  Know what a healthy weight is for you (roughly BMI <25) and aim to maintain this   Aim for 7+ servings of fruits and vegetables daily   65-80+ fluid ounces of water or unsweet tea for healthy kidneys   Limit to max 1 drink of alcohol per day; avoid smoking/tobacco   Limit animal fats in diet for cholesterol and heart health - choose grass fed whenever available  Avoid highly processed foods, and foods high in saturated/trans fats   Aim for low stress - take time to unwind and care for your mental health   Aim for 150 min of moderate intensity exercise weekly for heart health, and weights twice weekly for bone health   Aim for 7-9 hours of sleep daily   When it comes to diets, agreement about the perfect plan isnt easy to find, even among the experts. Experts at the [employer] developed an idea known as the Healthy Eating Plate. Just imagine a plate divided into logical, healthy portions.   The emphasis is on diet quality:   Load up on vegetables and fruits - one-half of your plate: Aim for color and variety, and remember that potatoes dont count.   Go for whole grains - one-quarter of your plate: Whole wheat, barley, wheat berries, quinoa, oats, brown rice, and foods made with them. If you want pasta, go with whole  wheat pasta.   Protein power - one-quarter of your plate: Fish, chicken, beans, and nuts are all healthy, versatile protein sources. Limit red meat.   The diet, however, does go beyond the plate, offering a few other suggestions.   Use healthy plant oils, such as olive, canola, soy, corn, sunflower and peanut. Check the labels, and avoid partially hydrogenated oil, which have unhealthy trans fats.   If youre thirsty, drink water. Coffee and tea are good in moderation, but skip sugary drinks and limit milk and dairy products to one or two daily servings.   The type of carbohydrate in the diet is more important than the amount. Some sources of carbohydrates, such as vegetables, fruits, whole grains, and beans-are healthier than others.   Finally, stay active  Signed, Berniece Salines, DO  05/18/2021 5:21 PM    [facility]

## 2021-05-18 NOTE — Patient Instructions (Signed)
-   STOP Metfromin  - STOP Glipizide  - STOP lantus  - START Toujeo 40 units daily  - START Trulicity [id number] mg ONCE weekly     HOW TO TREAT LOW BLOOD SUGARS (Blood sugar LESS THAN 70 MG/DL) Please follow the RULE OF 15 for the treatment of hypoglycemia treatment (when your (blood sugars are less than 70 mg/dL)   STEP 1: Take 15 grams of carbohydrates when your blood sugar is low, which includes:  3-4 GLUCOSE TABS  OR 3-4 OZ OF JUICE OR REGULAR SODA OR ONE TUBE OF GLUCOSE GEL    STEP 2: RECHECK blood sugar in 15 MINUTES STEP 3: If your blood sugar is still low at the 15 minute recheck --> then, go back to STEP 1 and treat AGAIN with another 15 grams of carbohydrates.

## 2021-05-18 NOTE — Progress Notes (Addendum)
Name: SHEKINA CORDELL  MRN/ DOB: [id number], [birth year]   Age/ Sex: 70 y.o., female    PCP: Carollee Herter, Alferd Apa, DO   Reason for Endocrinology Evaluation: Type 2 Diabetes Mellitus/ Hypothyroidism     Date of Initial Endocrinology Visit: 05/18/2021     PATIENT IDENTIFIER: Ms. AGUSTA HACKENBERG is a 70 y.o. female with a past medical history of T2DM, HTN, COPD and Hypothyroidism . The patient presented for initial endocrinology clinic visit on 05/18/2021 for consultative assistance with her diabetes management.    HPI: Ms. Dini is accompanied by her daughter [name]    Diagnosed with DM years ago  Prior Medications tried/Intolerance: as listed  Currently checking blood sugars 1 x / day Hypoglycemia episodes : yes               Symptoms: yes                 Frequency: 2/ month Hemoglobin A1c has ranged from 6.7% in 2020, peaking at 10.6% in 2019. Patient required assistance for hypoglycemia: no Patient has required hospitalization within the last 1 year from hyper or hypoglycemia: no      THYROID HISTORY: She was diagnosed with hypothyroidism for years. Has been on LT-4 replacement  No proir radiation exposure or sx       HOME DIABETES REGIMEN: Lantus (Vials) 130 units (1 cc syringes)  Glipizide 10 mg BID Levothyroxine 88 mcg daily  Metformin 500 mg BID     Statin: no  ACE-I/ARB: yes Prior Diabetic Education: no   METER DOWNLOAD SUMMARY: Did not bring     DIABETIC COMPLICATIONS: Microvascular complications:  CKD III, neuropathy Denies: retinopathy  Last eye exam: Completed 2022  Macrovascular complications:   Denies: CAD, PVD, CVA   PAST HISTORY: Past Medical History:  Past Medical History:  Diagnosis Date  . Allergic rhinitis   . Depression   . Diabetes mellitus type 2, uncontrolled ([place])   . Emphysema of lung ([place])    3L home O2  . GERD (gastroesophageal reflux disease)   . Hypertension   . Hypothyroidism   . Obesity, morbid, BMI 50 or higher  ([place])   . Stroke [facility]) 2016   TIA   . Urine incontinence    Past Surgical History:  Past Surgical History:  Procedure Laterality Date  . ABDOMINAL HYSTERECTOMY    . CESAREAN SECTION    . COLONOSCOPY N/A 08/19/2019   Procedure: COLONOSCOPY;  Surgeon: Clarene Essex, MD;  Location: [facility];  Service: Endoscopy;  Laterality: N/A;  . COLONOSCOPY WITH PROPOFOL N/A 08/05/2019   Procedure: COLONOSCOPY WITH PROPOFOL;  Surgeon: Wilford Corner, MD;  Location: [facility];  Service: Gastroenterology;  Laterality: N/A;  . COLONOSCOPY WITH PROPOFOL N/A 11/19/2019   Procedure: COLONOSCOPY WITH PROPOFOL;  Surgeon: Ronald Lobo, MD;  Location: [facility];  Service: Endoscopy;  Laterality: N/A;  Unprepped  . COLONOSCOPY WITH PROPOFOL N/A 11/22/2019   Procedure: COLONOSCOPY WITH PROPOFOL;  Surgeon: Ronald Lobo, MD;  [facility]: [facility];  Service: Endoscopy;  Laterality: N/A;  . COLONOSCOPY WITH PROPOFOL N/A 11/23/2019   Procedure: COLONOSCOPY WITH PROPOFOL;  Surgeon: [name], MD;  [facility];  Service: Gastroenterology;  Laterality: N/A;  . COLONOSCOPY WITH PROPOFOL N/A 11/29/2019   Procedure: COLONOSCOPY WITH PROPOFOL;  Surgeon: Wilford Corner, MD;  [facility];  Service: Endoscopy;  Laterality: N/A;  . ENTEROSCOPY N/A 11/24/2019   Procedure: ENTEROSCOPY;  Surgeon: [name], MD;  [facility];  Service: Gastroenterology;  Laterality: N/A;  . ENTEROSCOPY N/A 11/27/2019   Procedure: ENTEROSCOPY;  Surgeon: Wilford Corner, MD;  [facility];  Service: Endoscopy;  Laterality: N/A;  . ESOPHAGOGASTRODUODENOSCOPY N/A 11/27/2019   Procedure: ESOPHAGOGASTRODUODENOSCOPY (EGD);  Surgeon: Wilford Corner, MD;  [facility];  Service: Endoscopy;  Laterality: N/A;  . ESOPHAGOGASTRODUODENOSCOPY (EGD) WITH PROPOFOL N/A 11/24/2019   Procedure: ESOPHAGOGASTRODUODENOSCOPY (EGD) WITH PROPOFOL;  Surgeon: [name], MD;  [facility];   Service: Gastroenterology;  Laterality: N/A;  PUSH enteroscopy  . GIVENS CAPSULE STUDY N/A 11/19/2019   Procedure: GIVENS CAPSULE STUDY;  Surgeon: Ronald Lobo, MD;  [facility];  Service: Endoscopy;  Laterality: N/A;  To be performed immediately following colonoscopy  . GIVENS CAPSULE STUDY N/A 11/24/2019   Procedure: GIVENS CAPSULE STUDY;  Surgeon: [name], MD;  [facility];  Service: Gastroenterology;  Laterality: N/A;  . GIVENS CAPSULE STUDY N/A 11/28/2019   Procedure: GIVENS CAPSULE STUDY;  Surgeon: Wilford Corner, MD;  [facility];  Service: Endoscopy;  Laterality: N/A;  . HEMOSTASIS CLIP PLACEMENT  11/19/2019   Procedure: HEMOSTASIS CLIP PLACEMENT;  Surgeon: Ronald Lobo, MD;  [facility];  Service: Endoscopy;;  . HEMOSTASIS CLIP PLACEMENT  11/22/2019   Procedure: HEMOSTASIS CLIP PLACEMENT;  Surgeon: Ronald Lobo, MD;  [facility];  Service: Endoscopy;;  . HOT HEMOSTASIS N/A 11/24/2019   Procedure: HOT HEMOSTASIS (ARGON PLASMA COAGULATION/BICAP);  Surgeon: [name], MD;  Location: [facility];  Service: Gastroenterology;  Laterality: N/A;  . HOT HEMOSTASIS N/A 11/27/2019   Procedure: HOT HEMOSTASIS (ARGON PLASMA COAGULATION/BICAP);  Surgeon: Wilford Corner, MD;  Location: [facility];  Service: Endoscopy;  Laterality: N/A;  . SUBMUCOSAL TATTOO INJECTION  11/19/2019   Procedure: SUBMUCOSAL TATTOO INJECTION;  Surgeon: Ronald Lobo, MD;  [facility];  Service: Endoscopy;;    Social History:  reports that she quit smoking about 9 years ago. Her smoking use included cigarettes. She started smoking about 48 years ago. She has a 40.00 pack-year smoking history. She has never used smokeless tobacco. She reports that she does not currently use alcohol. She reports that she does not use drugs. Family History:  Family History  Problem Relation Age of Onset  . Heart disease Father        MVP and Pics Valve  .  Hypertension Father   . Depression Father        Institutionalized x's 2 years  . Bipolar disorder Father   . Hypertension Sister   . Diabetes Sister   . Hyperlipidemia Sister   . Heart disease Sister 1       MI  . Heart disease Brother   . Hypertension Brother   . Heart disease Paternal Grandmother   . Heart disease Paternal Aunt   . Heart disease Paternal Uncle   . Schizophrenia Paternal Aunt   . Asthma Son   . Asthma Son      HOME MEDICATIONS: Allergies as of 05/18/2021       Reactions   Hydrocodone Other (See Comments)   Constipation and hallucinations   Norvasc [amlodipine Besylate] Swelling, Other (See Comments)   Marked swelling of the limbs   Tizanidine Other (See Comments)   After the 3rd dose, the patient's mouth began to feel numb and she felt like she was a having a "hot flash"        Medication List        Accurate as of May 18, 2021  1:07 PM. If you have any questions,  ask your nurse or doctor.          Accu-Chek FastClix Lancets Misc USE TO CHECK BLOOD SUGAR UP TO FOUR TIMES DAILY AS DIRECTED What changed: See the new instructions.   Accu-Chek Guide test strip Generic drug: glucose blood USE 1 STRIP TO CHECK GLUCOSE UP TO 4 TIMES DAILY   albuterol 108 (90 Base) MCG/ACT inhaler Commonly known as: VENTOLIN HFA Inhale 1-2 puffs into the lungs every 6 (six) hours as needed for wheezing or shortness of breath.   albuterol (2.5 MG/3ML) 0.083% nebulizer solution Commonly known as: PROVENTIL Take 3 mLs (2.5 mg total) by nebulization every 6 (six) hours as needed for wheezing or shortness of breath.   amoxicillin-clavulanate 875-125 MG tablet Commonly known as: Augmentin Take 1 tablet by mouth 2 (two) times daily.   blood glucose meter kit and supplies Kit Dispense based on patient and insurance preference. Use up to four times daily as directed. (FOR ICD-9 250.00, 250.01).   busPIRone 15 MG tablet Commonly known as: BUSPAR Take 15 mg by  mouth at bedtime.   chlorpheniramine-HYDROcodone 10-8 MG/5ML Suer Commonly known as: Tussionex Pennkinetic ER Take 5 mLs by mouth every 12 (twelve) hours as needed for cough.   famotidine 20 MG tablet Commonly known as: PEPCID Take 20 mg by mouth daily as needed for heartburn or indigestion.   fluocinonide-emollient 0.05 % cream Commonly known as: LIDEX-E Apply 1 application topically 2 (two) times daily.   fluticasone 50 MCG/ACT nasal spray Commonly known as: FLONASE Place 2 sprays into both nostrils daily as needed for allergies.   glipiZIDE 10 MG tablet Commonly known as: GLUCOTROL TAKE 1 TABLET BY MOUTH TWICE DAILY BEFORE A MEAL OFFICE VISIT NEEDED FOR MORE REFILLS   Insulin Syringe-Needle U-100 28G X 5/16" 0.5 ML Misc Use with lantus once a day   Lantus 100 UNIT/ML injection Generic drug: insulin glargine INJECT 30 UNITS SUBCUTANEOUSLY AT BEDTIME   levothyroxine 88 MCG tablet Commonly known as: SYNTHROID Take 1 tablet (88 mcg total) by mouth daily.   LORazepam 1 MG tablet Commonly known as: ATIVAN Take 0.5-1.5 mg by mouth See admin instructions. Take 1-1.5 mg by mouth at bedtime and an additional 0.5 mg once a day as needed for anxiety   Magnesium 400 MG Caps Take 1 capsule by mouth daily.   metFORMIN 500 MG tablet Commonly known as: GLUCOPHAGE TAKE 2 TABLETS BY MOUTH TWICE DAILY WITH A  MEAL   methocarbamol 500 MG tablet Commonly known as: Robaxin Take 1 tablet (500 mg total) by mouth 4 (four) times daily.   metoprolol succinate 50 MG 24 hr tablet Commonly known as: TOPROL-XL Take 2 tablets (100 mg total) by mouth daily. Take with or immediately following a meal.   NONFORMULARY OR COMPOUNDED ITEM Pt/ inr   Dx dvt   Tomorrow 04/05/2019  Please call office [phone] with results   NONFORMULARY OR COMPOUNDED ITEM PT/ inr    Dx dvt   NONFORMULARY OR COMPOUNDED ITEM Compression stockings  20-30 mm/hg  #1   Dx low ext edema   NovoLIN N FlexPen 100 UNIT/ML  Kiwkpen Generic drug: Insulin NPH (Human) ([place]) Sliding scale as directed --- no more than qid and max 10 u each time   olmesartan 20 MG tablet Commonly known as: BENICAR Take 1 tablet (20 mg total) by mouth daily.   OXYGEN Inhale 3-4 L/min into the lungs continuous.   potassium chloride SA 20 MEQ tablet Commonly known as: KLOR-CON Take 1 tablet  by mouth once daily   predniSONE 10 MG tablet Commonly known as: DELTASONE TAKE 3 TABLETS PO QD FOR 3 DAYS THEN TAKE 2 TABLETS PO QD FOR 3 DAYS THEN TAKE 1 TABLET PO QD FOR 3 DAYS THEN TAKE 1/2 TAB PO QD FOR 3 DAYS   QUEtiapine 100 MG tablet Commonly known as: SEROQUEL Take 150 mg by mouth at bedtime.   torsemide 20 MG tablet Commonly known as: DEMADEX Take 2 tablets by mouth once daily   traMADol 50 MG tablet Commonly known as: ULTRAM Take 1 tablet (50 mg total) by mouth every 6 (six) hours as needed.   zolpidem 10 MG tablet Commonly known as: AMBIEN Take 10 mg by mouth at bedtime.         ALLERGIES: Allergies  Allergen Reactions  . Hydrocodone Other (See Comments)    Constipation and hallucinations  . Norvasc [Amlodipine Besylate] Swelling and Other (See Comments)    Marked swelling of the limbs  . Tizanidine Other (See Comments)    After the 3rd dose, the patient's mouth began to feel numb and she felt like she was a having a "hot flash"     REVIEW OF SYSTEMS: A comprehensive ROS was conducted with the patient and is negative except as per HPI and below:  Review of Systems  Gastrointestinal:  Negative for diarrhea and nausea.  Neurological:  Positive for tingling.     OBJECTIVE:   VITAL SIGNS: BP 130/82 (BP Location: Left Arm, Patient Position: Sitting, Cuff Size: Large)   Pulse 85   Ht '5\' 3"'  (1.6 m)   Wt 291 lb 12.8 oz (132.4 kg)   SpO2 98%   BMI 51.69 kg/m    PHYSICAL EXAM:  General: Pt appears well and is in NAD  Neck: General: Supple without adenopathy or carotid bruits. Thyroid: Thyroid size  normal.  No goiter or nodules appreciated.  Lungs: Clear with good BS bilat with no rales, rhonchi, or wheezes  Heart: RRR with normal S1 and S2 and no gallops; no murmurs; no rub  Abdomen: Normoactive bowel sounds, soft, nontender, without masses or organomegaly palpable  Extremities:  Lower extremities - No pretibial edema. No lesions.  Skin: Normal texture and temperature to palpation. No rash noted.  Neuro: MS is good with appropriate affect, pt is alert and Ox3    DM foot exam:   The skin of the feet is intact without sores or ulcerations. The pedal pulses are 1+ on right and 1+ on left. The sensation is decreased  to a screening 5.07, 10 gram monofilament at the heels bilaterally.    DATA REVIEWED:  Lab Results  Component Value Date   HGBA1C 7.5 (H) 04/05/2021   HGBA1C 7.7 (H) 02/01/2021   HGBA1C 8.6 (H) 05/28/2020   Lab Results  Component Value Date   LDLCALC 85 04/05/2021   CREATININE 1.75 (H) 04/05/2021   No results found for: [facility]  Lab Results  Component Value Date   CHOL 147 04/05/2021   HDL 32.00 (L) 04/05/2021   LDLCALC 85 04/05/2021   TRIG 150.0 (H) 04/05/2021   CHOLHDL 5 04/05/2021        ASSESSMENT / PLAN / RECOMMENDATIONS:   1) Type 2 Diabetes Mellitus, Sub-Optimally controlled, With CKD III, and neuropathic  complications - Most recent A1c of 7.5 %. Goal A1c < 7.0 %.    - She tells me she uses lantus Vials and draws 130 units , she has 1 cc syringe but I explained to  her this does not go up all the way to 130 units, the max if 100 ? After much debate , she believs she has been taking 30 units rather then 130 units - I am going to switch her lantus to Toujeo at 40 units daily  - I will stop Glipizide as she is on insulin and endorses hypoglycemia  - I will stop Metformin due to low GFR      MEDICATIONS:  STOP Metfromin  - STOP Glipizide  - STOP lantus  - START Toujeo 40 units daily  - START Trulicity 7.11 mg ONCE weekly      EDUCATION / INSTRUCTIONS: BG monitoring instructions: Patient is instructed to check her blood sugars 1 times a day, fasting . Call [facility] if: BG persistently < 70  I reviewed the Rule of 15 for the treatment of hypoglycemia in detail with the patient. Literature supplied.   2) Diabetic complications:  Eye: Does not have known diabetic retinopathy.  Neuro/ Feet: Does  have known diabetic peripheral neuropathy. Renal: Patient does  have known baseline CKD. She is  on an ACEI/ARB at present.Check urine albumin/creatinine ratio yearly starting at time of diagnosis.   3) Lipids: Patient is no on a satin and LDL acceptable      4) Hypothyroidism :  - She has been taking levothyroxine in the evening  - Pt educated extensively on the correct way to take levothyroxine (first thing in the morning with water, 30 minutes before eating or taking other medications). - Pt encouraged to double dose the following day if she were to miss a dose given long half-life of levothyroxine.  Medication Levothyroxine 88 mcg daily     I spent 45 minutes preparing to see the patient by review of recent labs, imaging and procedures, obtaining and reviewing separately obtained history, communicating with the patient/family or caregiver, ordering medications, tests or procedures, and documenting clinical information in the EHR including the differential Dx, treatment, and any further evaluation and other management    F/U in 2 months   Signed electronically by: Mack Guise, MD  [facility]  [facility] [address] [place], [place] [address] Phone: [phone] FAX: [phone]   CC: Ann Held, DO [address] [place] [address] Phone: [phone]  Fax: [phone]    Return to Endocrinology clinic as below: Future Appointments  Date Time Provider [place]  05/18/2021  3:40 PM  Berniece Salines, DO [facility] None  07/05/2021  9:40 AM Ann Held, DO [facility]

## 2021-05-18 NOTE — Patient Instructions (Addendum)
Medication Instructions:  Your physician has recommended you make the following change in your medication.  START: Hydralazine 12.5 mg every 8 hours *If you need a refill on your cardiac medications before your next appointment, please call your pharmacy*   Lab Work: Your physician recommends that you return for lab work in:  3-7 days before CT scan:  BMET, Mag If you have labs (blood work) drawn today and your tests are completely normal, you will receive your results only by: [place] (if you have MyChart) OR A paper copy in the mail If you have any lab test that is abnormal or we need to change your treatment, we will call you to review the results.   Testing/Procedures:   Your cardiac CT will be scheduled at one of the below locations:   [facility] [address] [place], [place] [address] [phone]    If scheduled at [facility], please arrive at the [facility] main entrance (entrance A) of [facility] 30 minutes prior to test start time. Proceed to the [facility] Radiology Department (first floor) to check-in and test prep.   Please follow these instructions carefully (unless otherwise directed):    On the Night Before the Test: Be sure to Drink plenty of water. Do not consume any caffeinated/decaffeinated beverages or chocolate 12 hours prior to your test. Do not take any antihistamines 12 hours prior to your test.   On the Day of the Test: Drink plenty of water until 1 hour prior to the test. Do not eat any food 4 hours prior to the test. You may take your regular medications prior to the test.  Take metoprolol two hours prior to test. FEMALES- please wear underwire-free bra if available, avoid dresses & tight clothing       After the Test: Drink plenty of water. After receiving IV contrast, you may experience a mild flushed feeling. This is normal. On occasion, you may experience a mild rash up to 24 hours after  the test. This is not dangerous. If this occurs, you can take Benadryl 25 mg and increase your fluid intake. If you experience trouble breathing, this can be serious. If it is severe call 911 IMMEDIATELY. If it is mild, please call our office. If you take any of these medications: Glipizide/Metformin, Avandament, Glucavance, please do not take 48 hours after completing test unless otherwise instructed.  Please allow 2-4 weeks for scheduling of routine cardiac CTs. Some insurance companies require a pre-authorization which may delay scheduling of this test.   For non-scheduling related questions, please contact the cardiac imaging nurse navigator should you have any questions/concerns: Marchia Bond, Cardiac Imaging Nurse Navigator Gordy Clement, Cardiac Imaging Nurse Navigator [facility] Direct Office Dial: [phone]   For scheduling needs, including cancellations and rescheduling, please call [place], [phone].  A zio monitor was ordered today. It will remain on for 14 days. You will then return monitor and event diary in provided box. It takes 1-2 weeks for report to be downloaded and returned to [place]. We will call you with the results. If monitor falls off or has orange flashing light, please call Zio for further instructions.   ZIO  WHY IS MY DOCTOR PRESCRIBING ZIO? The Zio system is proven and trusted by physicians to detect and diagnose irregular heart rhythms -- and has been prescribed to hundreds of thousands of patients.  The FDA has cleared the Zio system to monitor for many  different kinds of irregular heart rhythms. In a study, physicians were able to reach a diagnosis 90% of the time with the Zio system1.  You can wear the Zio monitor -- a small, discreet, comfortable patch -- during your normal day-to-day activity, including while you sleep, shower, and exercise, while it records every single heartbeat for analysis.  1Barrett, P., et al.  Comparison of 24 Hour Holter Monitoring Versus 14 Day Novel Adhesive Patch Electrocardiographic Monitoring. [place], 2014.  ZIO VS. HOLTER MONITORING The Zio monitor can be comfortably worn for up to 14 days. Holter monitors can be worn for 24 to 48 hours, limiting the time to record any irregular heart rhythms you may have. Zio is able to capture data for the 51% of patients who have their first symptom-triggered arrhythmia after 48 hours.1  LIVE WITHOUT RESTRICTIONS The Zio ambulatory cardiac monitor is a small, unobtrusive, and water-resistant patch--you might even forget you're wearing it. The Zio monitor records and stores every beat of your heart, whether you're sleeping, working out, or showering. Remove 14 days after application.   Your physician has requested that you have an echocardiogram. Echocardiography is a painless test that uses sound waves to create images of your heart. It provides your doctor with information about the size and shape of your heart and how well your heart's chambers and valves are working. This procedure takes approximately one hour. There are no restrictions for this procedure.   Follow-Up: At [facility], you and your health needs are our priority.  As part of our continuing mission to provide you with exceptional heart care, we have created designated Provider Care Teams.  These Care Teams include your primary Cardiologist (physician) and Advanced Practice Providers (APPs -  Physician Assistants and Nurse Practitioners) who all work together to provide you with the care you need, when you need it.  We recommend signing up for the patient portal called "MyChart".  Sign up information is provided on this After Visit Summary.  MyChart is used to connect with patients for Virtual Visits (Telemedicine).  Patients are able to view lab/test results, encounter notes, upcoming appointments, etc.  Non-urgent messages can be sent to your provider as  well.   To learn more about what you can do with MyChart, go to [email or web address].    Your next appointment:   12 week(s)  The format for your next appointment:   In Person  Provider:   [id number], [place], [place] [id number]    Other Instructions  Echocardiogram An echocardiogram is a test that uses sound waves (ultrasound) to produce images of the heart. Images from an echocardiogram can provide important information about: Heart size and shape. The size and thickness and movement of your heart's walls. Heart muscle function and strength. Heart valve function or if you have stenosis. Stenosis is when the heart valves are too narrow. If blood is flowing backward through the heart valves (regurgitation). A tumor or infectious growth around the heart valves. Areas of heart muscle that are not working well because of poor blood flow or injury from a heart attack. Aneurysm detection. An aneurysm is a weak or damaged part of an artery wall. The wall bulges out from the normal force of blood pumping through the body. Tell a health care provider about: Any allergies you have. All medicines you are taking, including vitamins, herbs, eye drops, creams, and over-the-counter medicines. Any blood disorders you have. Any surgeries  you have had. Any medical conditions you have. Whether you are pregnant or may be pregnant. What are the risks? Generally, this is a safe test. However, problems may occur, including an allergic reaction to dye (contrast) that may be used during the test. What happens before the test? No specific preparation is needed. You may eat and drink normally. What happens during the test?  You will take off your clothes from the waist up and put on a hospital gown. Electrodes or electrocardiogram (ECG)patches may be placed on your chest. The electrodes or patches are then connected to a device that monitors your heart rate and  rhythm. You will lie down on a table for an ultrasound exam. A gel will be applied to your chest to help sound waves pass through your skin. A handheld device, called a transducer, will be pressed against your chest and moved over your heart. The transducer produces sound waves that travel to your heart and bounce back (or "echo" back) to the transducer. These sound waves will be captured in real-time and changed into images of your heart that can be viewed on a video monitor. The images will be recorded on a computer and reviewed by your health care provider. You may be asked to change positions or hold your breath for a short time. This makes it easier to get different views or better views of your heart. In some cases, you may receive contrast through an IV in one of your veins. This can improve the quality of the pictures from your heart. The procedure may vary among health care providers and hospitals. What can I expect after the test? You may return to your normal, everyday life, including diet, activities, and medicines, unless your health care provider tells you not to do that. Follow these instructions at home: It is up to you to get the results of your test. Ask your health care provider, or the department that is doing the test, when your results will be ready. Keep all follow-up visits. This is important. Summary An echocardiogram is a test that uses sound waves (ultrasound) to produce images of the heart. Images from an echocardiogram can provide important information about the size and shape of your heart, heart muscle function, heart valve function, and other possible heart problems. You do not need to do anything to prepare before this test. You may eat and drink normally. After the echocardiogram is completed, you may return to your normal, everyday life, unless your health care provider tells you not to do that. This information is not intended to replace advice given to you by your  health care provider. Make sure you discuss any questions you have with your health care provider. Document Revised: 04/21/2020 Document Reviewed: 04/21/2020 Elsevier Patient Education  2022 [employer].

## 2021-05-19 ENCOUNTER — Other Ambulatory Visit: Payer: Self-pay

## 2021-05-19 DIAGNOSIS — R002 Palpitations: Secondary | ICD-10-CM

## 2021-05-20 ENCOUNTER — Telehealth: Payer: Self-pay | Admitting: Internal Medicine

## 2021-05-20 NOTE — Telephone Encounter (Signed)
Can you please contact the pt and see if she figured out how to use her pens ?  I saw her this past Tuesday in [place] showed her insulin pen and Trulicity pen but she called yesterday asking for help.    Shan Levans spoke to her and I have asked to go to the pharmacy and see if could show her how to use it.     If she has not done so, please offer the pt a "Nurse visit " for training.   She can either go to [place] but ONLY on Tuesdays when I am there , or she could come to [place] other day     Thanks

## 2021-05-27 ENCOUNTER — Telehealth (HOSPITAL_COMMUNITY): Payer: Self-pay | Admitting: Emergency Medicine

## 2021-05-27 DIAGNOSIS — R9431 Abnormal electrocardiogram [ECG] [EKG]: Secondary | ICD-10-CM | POA: Diagnosis not present

## 2021-05-27 DIAGNOSIS — R0600 Dyspnea, unspecified: Secondary | ICD-10-CM | POA: Diagnosis not present

## 2021-05-27 NOTE — Telephone Encounter (Signed)
Phone call to remind patient to get labs prior to CCTA appt Monday.  Pt has CKD and last months GFR was 29. I informed the daughter that if her GFR remains less than 30, we will have to cancel her CCTA appt because we will not be able to give contrast without clearance from a nephrologist.   Pts daughter verbalized understanding. States she plans to get labs this afternoon  [facility] [phone] Office  [phone] Cell

## 2021-05-28 ENCOUNTER — Telehealth (HOSPITAL_COMMUNITY): Payer: Self-pay | Admitting: Emergency Medicine

## 2021-05-28 LAB — BASIC METABOLIC PANEL
BUN/Creatinine Ratio: 8 — ABNORMAL LOW (ref 12–28)
BUN: 13 mg/dL (ref 8–27)
CO2: 25 mmol/L (ref 20–29)
Calcium: 9 mg/dL (ref 8.7–10.3)
Chloride: 97 mmol/L (ref 96–106)
Creatinine, Ser: 1.69 mg/dL — ABNORMAL HIGH (ref 0.57–1.00)
Glucose: 151 mg/dL — ABNORMAL HIGH (ref 65–99)
Potassium: 4.1 mmol/L (ref 3.5–5.2)
Sodium: 137 mmol/L (ref 134–144)
eGFR: 33 mL/min/{1.73_m2} — ABNORMAL LOW (ref >59)

## 2021-05-28 LAB — MAGNESIUM: Magnesium: 1.8 mg/dL (ref 1.6–2.3)

## 2021-05-28 NOTE — Telephone Encounter (Signed)
error 

## 2021-05-28 NOTE — Telephone Encounter (Signed)
Reaching out to patient to offer assistance regarding upcoming cardiac imaging study; pt verbalizes understanding of appt date/time, parking situation and where to check in, pre-test NPO status and medications ordered, and verified current allergies; name and call back number provided for further questions should they arise Marchia Bond RN [facility] [phone] office [phone] cell  Spoke with pts daughter who assists with all medical appointments Denies iv issues Denies claustro On continuous O2  Not currently wearing ZIO-- waiting until CTA is complete to begin monitor

## 2021-05-31 ENCOUNTER — Ambulatory Visit (HOSPITAL_COMMUNITY)
Admission: RE | Admit: 2021-05-31 | Discharge: 2021-05-31 | Disposition: A | Discharge status: Home / Self Care | Payer: Medicare HMO | Source: Ambulatory Visit | Attending: Cardiology | Admitting: Cardiology

## 2021-05-31 ENCOUNTER — Other Ambulatory Visit: Payer: Self-pay

## 2021-05-31 ENCOUNTER — Encounter (HOSPITAL_COMMUNITY): Payer: Self-pay

## 2021-05-31 DIAGNOSIS — R0600 Dyspnea, unspecified: Secondary | ICD-10-CM

## 2021-05-31 DIAGNOSIS — R0609 Other forms of dyspnea: Secondary | ICD-10-CM

## 2021-05-31 DIAGNOSIS — R9431 Abnormal electrocardiogram [ECG] [EKG]: Secondary | ICD-10-CM

## 2021-06-01 ENCOUNTER — Telehealth: Payer: Self-pay

## 2021-06-01 DIAGNOSIS — N1832 Chronic kidney disease, stage 3b: Secondary | ICD-10-CM

## 2021-06-01 NOTE — Telephone Encounter (Signed)
----- Message from Berniece Salines, DO sent at 06/01/2021 12:03 PM EDT ----- Regarding: RE: ct heart Yes, please have her seen nephrology.  Please let her daughter know that the plan will be a left heart catheterization giving for heart rate control.  A traditional nuclear stress test will not be a good test for her due to her body mass index.  I would like to hear from nephrology before setting her up for a heart catheterization. ----- Message ----- From: Gita Kudo, RN Sent: 06/01/2021  11:57 AM EDT To: Berniece Salines, DO Subject: RE: ct heart                                   Do you want me to place a referral for nephrology?   Does the patient need to be scheduled to see you to set up the heart cath? Or did you want me to go ahead and schedule it?  You saw her last on 05/18/2021.  Lilia Pro, RN ----- Message ----- From: Berniece Salines, DO Sent: 06/01/2021  10:11 AM EDT To: Lorenza Evangelist, RN, Orvan July, RN Subject: RE: ct heart                                   Okay thank you for the updates.  I think I will refer her to nephrology and then get her to the Cath Lab. ----- Message ----- From: Lorenza Evangelist, RN Sent: 05/31/2021   3:07 PM EDT To: Berniece Salines, DO, Orvan July, RN Subject: RE: ct heart                                   Hey Dr. Harriet Masson,  Just tagging onto this chain of messages  This patient arrived to her scan today with HR 90s. She stated she did not take ANY meds today, including her BB. So I suggested that we r/s and reiterated the she take her meds and possibly something in addition to slow her HR. But with her BMI and renal function and O2 dependence, Im not sure a CCTA is the best test for her.   Let me know your thoughts.  Marchia Bond ----- Message ----- From: Berniece Salines, DO Sent: 05/27/2021  12:41 PM EDT To: Lorenza Evangelist, RN, Orvan July, RN Subject: RE: ct heart                                   Ok, sounds like a plan. Could you please let me  know so I can refer her to nephro because for her will be CCTA or LHC?    ----- Message ----- From: Lorenza Evangelist, RN Sent: 05/27/2021   8:55 AM EDT To: Berniece Salines, DO, Orvan July, RN Subject: FW: ct heart                                   Good morning ladies,  I was reviewing charts for this patients CCTA appt on Monday. Shes might not be a candidate for CCTA since her GFR was <30 a month ago. I will call  her to see if she can get labs today or tomorrow but will set the expectation with her that if her GFR remains less than 30 we will not be able to scan her until she sees the nephrologist who hopefully gives [place] clearance.   Her HR is also 93 on '100mg'$  metoprolol succinate. I dont see additional HR lowering meds ordered. Did you want her to take an additional dose of her '100mg'$  metoprolol succinate 2 hr prior to her scan?  Please feel free to give me a call if you want to discuss further.  Marchia Bond ----- Message ----- From: Melony Overly Sent: 05/24/2021  12:08 PM EDT To: Ciro Backer, [name], RN, # Subject: ct heart                                       Scheduled 05/31/21 at 2:30    Thanks, [place]

## 2021-06-01 NOTE — Telephone Encounter (Signed)
Left message on patients voicemail to please return our call.   

## 2021-06-02 ENCOUNTER — Telehealth: Payer: Self-pay | Admitting: Family Medicine

## 2021-06-02 ENCOUNTER — Telehealth: Payer: Self-pay

## 2021-06-02 NOTE — Telephone Encounter (Signed)
Patient called and wants to know if it is possible for her to be referred to a Nephrologist that is closer to the [facility] location.   Patient telephone number is [phone]. Please advise.

## 2021-06-02 NOTE — Telephone Encounter (Signed)
Spoke with pt about her taking Metoprolol. She states "I do not remember anybody talking to me about taking any medication. I was not aware I was going to have an echocardiogram, and they canceled the CT scan? Nobody told me that." Pt made aware her CT scan was canceled due to her kidney function, BMI and she is oxygen dependant. Pt is aware she needs to make an appointment with the Nephrologist to determine her kidney function before the decision is made about her having a heart cath. She verbalized understanding and stated she ill call them after getting off the phone with me. She was given the phone number to call and set up an appt.  She states "I only took the Hydralazine for 1 week and I had to stop because it just made my heart race." Will relay message to Dr. Harriet Masson for advice.  She is aware Dr. Harriet Masson is out of oftice today and we will reach out when we get an answer, She verbalized understanding.

## 2021-06-03 NOTE — Telephone Encounter (Signed)
I called patient back today 9/22 and got connected with her daughter. I did inform her daughter of the information. She stated she will call [facility] to sch her mom an appt.

## 2021-06-07 ENCOUNTER — Ambulatory Visit (HOSPITAL_BASED_OUTPATIENT_CLINIC_OR_DEPARTMENT_OTHER): Admission: RE | Admit: 2021-06-07 | Payer: Medicare HMO | Source: Ambulatory Visit

## 2021-06-07 DIAGNOSIS — I129 Hypertensive chronic kidney disease with stage 1 through stage 4 chronic kidney disease, or unspecified chronic kidney disease: Secondary | ICD-10-CM | POA: Diagnosis not present

## 2021-06-07 DIAGNOSIS — E785 Hyperlipidemia, unspecified: Secondary | ICD-10-CM | POA: Diagnosis not present

## 2021-06-07 DIAGNOSIS — E1122 Type 2 diabetes mellitus with diabetic chronic kidney disease: Secondary | ICD-10-CM | POA: Diagnosis not present

## 2021-06-07 DIAGNOSIS — N189 Chronic kidney disease, unspecified: Secondary | ICD-10-CM | POA: Diagnosis not present

## 2021-06-07 DIAGNOSIS — N183 Chronic kidney disease, stage 3 unspecified: Secondary | ICD-10-CM | POA: Diagnosis not present

## 2021-06-08 DIAGNOSIS — F319 Bipolar disorder, unspecified: Secondary | ICD-10-CM | POA: Diagnosis not present

## 2021-06-11 DIAGNOSIS — J9601 Acute respiratory failure with hypoxia: Secondary | ICD-10-CM | POA: Diagnosis not present

## 2021-06-11 DIAGNOSIS — J449 Chronic obstructive pulmonary disease, unspecified: Secondary | ICD-10-CM | POA: Diagnosis not present

## 2021-06-14 ENCOUNTER — Encounter (HOSPITAL_COMMUNITY): Payer: Self-pay

## 2021-06-14 ENCOUNTER — Ambulatory Visit (HOSPITAL_COMMUNITY): Admission: RE | Admit: 2021-06-14 | Payer: Medicare HMO | Source: Ambulatory Visit

## 2021-06-18 ENCOUNTER — Other Ambulatory Visit: Payer: Self-pay | Admitting: Family Medicine

## 2021-06-21 ENCOUNTER — Other Ambulatory Visit: Payer: Self-pay

## 2021-06-21 ENCOUNTER — Ambulatory Visit (HOSPITAL_BASED_OUTPATIENT_CLINIC_OR_DEPARTMENT_OTHER)
Admission: RE | Admit: 2021-06-21 | Discharge: 2021-06-21 | Disposition: A | Discharge status: Home / Self Care | Payer: Medicare HMO | Source: Ambulatory Visit | Attending: Cardiology | Admitting: Cardiology

## 2021-06-21 ENCOUNTER — Other Ambulatory Visit: Payer: Self-pay | Admitting: Family Medicine

## 2021-06-21 DIAGNOSIS — R0609 Other forms of dyspnea: Secondary | ICD-10-CM | POA: Diagnosis not present

## 2021-06-21 LAB — ECHOCARDIOGRAM COMPLETE
AR max vel: 2.85 cm2
AV Area VTI: 3.01 cm2
AV Area mean vel: 3.07 cm2
AV Mean grad: 5 mmHg
AV Peak grad: 8.5 mmHg
Ao pk vel: 1.46 m/s
Area-P 1/2: 5.2 cm2
Calc EF: 66.5 %
S' Lateral: 3.2 cm
Single Plane A2C EF: 67.6 %
Single Plane A4C EF: 66.1 %

## 2021-06-22 DIAGNOSIS — F319 Bipolar disorder, unspecified: Secondary | ICD-10-CM | POA: Diagnosis not present

## 2021-07-05 ENCOUNTER — Ambulatory Visit: Payer: Medicare HMO | Admitting: Family Medicine

## 2021-07-07 ENCOUNTER — Ambulatory Visit: Payer: Medicare HMO | Admitting: Medical

## 2021-07-09 ENCOUNTER — Ambulatory Visit (INDEPENDENT_AMBULATORY_CARE_PROVIDER_SITE_OTHER): Payer: Medicare HMO | Admitting: Family Medicine

## 2021-07-09 ENCOUNTER — Encounter: Payer: Self-pay | Admitting: Family Medicine

## 2021-07-09 ENCOUNTER — Other Ambulatory Visit: Payer: Self-pay

## 2021-07-09 VITALS — BP 128/80 | HR 89 | Temp 97.9°F | Ht 63.0 in | Wt 297.0 lb

## 2021-07-09 DIAGNOSIS — R600 Localized edema: Secondary | ICD-10-CM

## 2021-07-09 DIAGNOSIS — R0609 Other forms of dyspnea: Secondary | ICD-10-CM

## 2021-07-09 MED ORDER — TORSEMIDE 20 MG PO TABS: 60.0000 mg | ORAL_TABLET | Freq: Every day | ORAL | 2 refills | Status: DC

## 2021-07-09 NOTE — Progress Notes (Signed)
Chief Complaint  Patient presents with   Foot Swelling    [name] here for bilateral foot swelling.  Duration: 1  week Hx of prolonged bedrest, recent surgery, travel or injury? No Pain the calf? No SOB? Yes, getting more shortness of breath with exertion Personal or family history of clot or bleeding disorder? No Hx of heart failure, renal failure, hepatic failure? No No new meds or supplements. No change in diet.   Past Medical History:  Diagnosis Date   Allergic rhinitis    Depression    Diabetes mellitus type 2, uncontrolled    Emphysema of lung (HCC)    3L home O2   GERD (gastroesophageal reflux disease)    Hypertension    Hypothyroidism    Obesity, morbid, BMI 50 or higher ([place])    Stroke ([place]) 2016   TIA    Urine incontinence    Family History  Problem Relation Age of Onset   Heart disease Father        MVP and Pics Valve   Hypertension Father    Depression Father        Institutionalized x's 2 years   Bipolar disorder Father    Hypertension Sister    Diabetes Sister    Hyperlipidemia Sister    Heart disease Sister 41       MI   Heart disease Brother    Hypertension Brother    Heart disease Paternal Grandmother    Heart disease Paternal Aunt    Heart disease Paternal Uncle    Schizophrenia Paternal Aunt    Asthma Son    Asthma Son     BP 128/80   Pulse 89   Temp 97.9 F (36.6 C) (Oral)   Ht 5\' 3"  (1.6 m)   Wt 297 lb (134.7 kg)   SpO2 96%   BMI 52.61 kg/m  Gen- awake, alert, appears stated age Heart- RRR, 2+ LE edema b/l tapering at prox 1/3 of tibia Lungs- CTAB, normal effort w/o accessory muscle use MSK- no R, but + chronic L calf pain and tibia pain Psych: Age appropriate judgment and insight  Bilateral lower extremity edema - Plan: torsemide (DEMADEX) 20 MG tablet  DOE (dyspnea on exertion) - Plan: Basic metabolic panel, torsemide (DEMADEX) 20 MG tablet  New problem, uncertain prog. Increase torsemide form 40 mg/d to 60 mg/d.  She is not reaching threshold. Ck labs today, may need to adjust K supp. Elevate legs, stay active, compression stockings, mind salt intake. Chronic calf pain, no sig change to suggest DVT.  F/u prn. Pt voiced understanding and agreement to the plan.  [place], DO 07/09/21  2:40 PM

## 2021-07-09 NOTE — Patient Instructions (Addendum)
For the swelling in your lower extremities, be sure to elevate your legs when able, mind the salt intake, stay physically active and consider wearing compression stockings.  Give [place] 2-3 business days to get the results of your labs back.   Take 3 tabs of the torsemide.   I would be on the lookout for bedbugs based on your skin.   Keep the diet clean and stay active.

## 2021-07-10 LAB — BASIC METABOLIC PANEL
BUN/Creatinine Ratio: 8 (calc) (ref 6–22)
BUN: 11 mg/dL (ref 7–25)
CO2: 31 mmol/L (ref 20–32)
Calcium: 8.8 mg/dL (ref 8.6–10.4)
Chloride: 97 mmol/L — ABNORMAL LOW (ref 98–110)
Creat: 1.45 mg/dL — ABNORMAL HIGH (ref 0.50–1.05)
Glucose, Bld: 231 mg/dL — ABNORMAL HIGH (ref 65–99)
Potassium: 3.5 mmol/L (ref 3.5–5.3)
Sodium: 138 mmol/L (ref 135–146)

## 2021-07-12 DIAGNOSIS — J449 Chronic obstructive pulmonary disease, unspecified: Secondary | ICD-10-CM | POA: Diagnosis not present

## 2021-07-12 DIAGNOSIS — J9601 Acute respiratory failure with hypoxia: Secondary | ICD-10-CM | POA: Diagnosis not present

## 2021-07-16 ENCOUNTER — Ambulatory Visit (INDEPENDENT_AMBULATORY_CARE_PROVIDER_SITE_OTHER): Payer: Medicare HMO | Admitting: Family Medicine

## 2021-07-16 ENCOUNTER — Other Ambulatory Visit: Payer: Self-pay

## 2021-07-16 ENCOUNTER — Encounter: Payer: Self-pay | Admitting: Family Medicine

## 2021-07-16 VITALS — BP 128/88 | HR 93 | Temp 98.8°F | Resp 20 | Ht 63.0 in | Wt 294.6 lb

## 2021-07-16 DIAGNOSIS — R600 Localized edema: Secondary | ICD-10-CM

## 2021-07-16 DIAGNOSIS — E876 Hypokalemia: Secondary | ICD-10-CM | POA: Diagnosis not present

## 2021-07-16 DIAGNOSIS — Z23 Encounter for immunization: Secondary | ICD-10-CM

## 2021-07-16 DIAGNOSIS — E039 Hypothyroidism, unspecified: Secondary | ICD-10-CM | POA: Diagnosis not present

## 2021-07-16 LAB — BASIC METABOLIC PANEL
BUN: 18 mg/dL (ref 6–23)
CO2: 32 mEq/L (ref 19–32)
Calcium: 8.7 mg/dL (ref 8.4–10.5)
Chloride: 97 mEq/L (ref 96–112)
Creatinine, Ser: 1.56 mg/dL — ABNORMAL HIGH (ref 0.40–1.20)
GFR: 33.84 mL/min — ABNORMAL LOW (ref >60.00)
Glucose, Bld: 208 mg/dL — ABNORMAL HIGH (ref 70–99)
Potassium: 3.1 mEq/L — ABNORMAL LOW (ref 3.5–5.1)
Sodium: 139 mEq/L (ref 135–145)

## 2021-07-16 MED ORDER — LEVOTHYROXINE SODIUM 88 MCG PO TABS: 88.0000 ug | ORAL_TABLET | Freq: Every day | ORAL | 3 refills | Status: DC

## 2021-07-16 NOTE — Assessment & Plan Note (Signed)
Elevated legs Compression socks  con't torsemide F/u cardiology

## 2021-07-16 NOTE — Patient Instructions (Signed)
Edema °Edema is an abnormal buildup of fluids in the body tissues and under the skin. Swelling of the legs, feet, and ankles is a common symptom that becomes more likely as you get older. Swelling is also common in looser tissues, like around the eyes. When the affected area is squeezed, the fluid may move out of that spot and leave a dent for a few moments. This dent is called pitting edema. °There are many possible causes of edema. Eating too much salt (sodium) and being on your feet or sitting for a long time can cause edema in your legs, feet, and ankles. Hot weather may make edema worse. Common causes of edema include: °Heart failure. °Liver or kidney disease. °Weak leg blood vessels. °Cancer. °An injury. °Pregnancy. °Medicines. °Being obese. °Low protein levels in the blood. °Edema is usually painless. Your skin may look swollen or shiny. °Follow these instructions at home: °Keep the affected body part raised (elevated) above the level of your heart when you are sitting or lying down. °Do not sit still or stand for long periods of time. °Do not wear tight clothing. Do not wear garters on your upper legs. °Exercise your legs to get your circulation going. This helps to move the fluid back into your blood vessels, and it may help the swelling go down. °Wear elastic bandages or support stockings to reduce swelling as told by your health care provider. °Eat a low-salt (low-sodium) diet to reduce fluid as told by your health care provider. °Depending on the cause of your swelling, you may need to limit how much fluid you drink (fluid restriction). °Take over-the-counter and prescription medicines only as told by your health care provider. °Contact a health care provider if: °Your edema does not get better with treatment. °You have heart, liver, or kidney disease and have symptoms of edema. °You have sudden and unexplained weight gain. °Get help right away if: °You develop shortness of breath or chest pain. °You  cannot breathe when you lie down. °You develop pain, redness, or warmth in the swollen areas. °You have heart, liver, or kidney disease and suddenly get edema. °You have a fever and your symptoms suddenly get worse. °Summary °Edema is an abnormal buildup of fluids in the body tissues and under the skin. °Eating too much salt (sodium) and being on your feet or sitting for a long time can cause edema in your legs, feet, and ankles. °Keep the affected body part raised (elevated) above the level of your heart when you are sitting or lying down. °This information is not intended to replace advice given to you by your health care provider. Make sure you discuss any questions you have with your health care provider. °Document Revised: 01/28/2021 Document Reviewed: 06/23/2020 °Elsevier Patient Education © 2022 Elsevier Inc. ° °

## 2021-07-16 NOTE — Assessment & Plan Note (Signed)
Recheck bmp

## 2021-07-16 NOTE — Progress Notes (Signed)
Subjective:   By signing my name below, I, Zite Okoli, attest that this documentation has been prepared under the direction and in the presence of  Roma Schanz R DO. 07/16/2021     Patient ID: Sue [name], female    DOB: 1950-09-21, 70 y.o.   MRN: [id number]  Chief Complaint  Patient presents with   Edema    Pt states swelling has gone down but states not urinating as much.    Follow-up    HPI Patient is in today for an office visit and follow up on previous visit. She is accompanied by her daughter.  She came on 07/09/2021 complaining of bilateral LE. Since her last visit, her dosage of lasix and torsemide was increased. She reports that the swelling in the feet have reduced from her last visit. She wears compression stockings and tries to put her feet up.  Her blood pressure at this visit is stable. BP Readings from Last 3 Encounters:  07/16/21 128/88  07/09/21 128/80  05/31/21 (!) 145/82    She checks her weight at home and reports she has been losing about 1 pound a month  She will receive the flu vaccine today.  Past Medical History:  Diagnosis Date   Allergic rhinitis    Depression    Diabetes mellitus type 2, uncontrolled    Emphysema of lung (HCC)    3L home O2   GERD (gastroesophageal reflux disease)    Hypertension    Hypothyroidism    Obesity, morbid, BMI 50 or higher ([place])    Stroke ([place]) 2016   TIA    Urine incontinence     Past Surgical History:  Procedure Laterality Date   ABDOMINAL HYSTERECTOMY     CESAREAN SECTION     COLONOSCOPY N/A 08/19/2019   Procedure: COLONOSCOPY;  Surgeon: Clarene Essex, MD;  Location: [facility];  Service: Endoscopy;  Laterality: N/A;   COLONOSCOPY WITH PROPOFOL N/A 08/05/2019   Procedure: COLONOSCOPY WITH PROPOFOL;  Surgeon: Wilford Corner, MD;  Location: [facility];  Service: Gastroenterology;  Laterality: N/A;   COLONOSCOPY WITH PROPOFOL N/A 11/19/2019   Procedure: COLONOSCOPY WITH PROPOFOL;  Surgeon:  Ronald Lobo, MD;  [facility]: [facility];  Service: Endoscopy;  Laterality: N/A;  Unprepped   COLONOSCOPY WITH PROPOFOL N/A 11/22/2019   Procedure: COLONOSCOPY WITH PROPOFOL;  Surgeon: Ronald Lobo, MD;  [facility];  Service: Endoscopy;  Laterality: N/A;   COLONOSCOPY WITH PROPOFOL N/A 11/23/2019   Procedure: COLONOSCOPY WITH PROPOFOL;  Surgeon: [name], MD;  [facility];  Service: Gastroenterology;  Laterality: N/A;   COLONOSCOPY WITH PROPOFOL N/A 11/29/2019   Procedure: COLONOSCOPY WITH PROPOFOL;  Surgeon: Wilford Corner, MD;  [facility];  Service: Endoscopy;  Laterality: N/A;   ENTEROSCOPY N/A 11/24/2019   Procedure: ENTEROSCOPY;  Surgeon: [name], MD;  [facility];  Service: Gastroenterology;  Laterality: N/A;   ENTEROSCOPY N/A 11/27/2019   Procedure: ENTEROSCOPY;  Surgeon: Wilford Corner, MD;  [facility];  Service: Endoscopy;  Laterality: N/A;   ESOPHAGOGASTRODUODENOSCOPY N/A 11/27/2019   Procedure: ESOPHAGOGASTRODUODENOSCOPY (EGD);  Surgeon: Wilford Corner, MD;  Location: [facility];  Service: Endoscopy;  Laterality: N/A;   ESOPHAGOGASTRODUODENOSCOPY (EGD) WITH PROPOFOL N/A 11/24/2019   Procedure: ESOPHAGOGASTRODUODENOSCOPY (EGD) WITH PROPOFOL;  Surgeon: [name], MD;  [facility];  Service: Gastroenterology;  Laterality: N/A;  PUSH enteroscopy   GIVENS CAPSULE STUDY N/A 11/19/2019   Procedure: GIVENS CAPSULE STUDY;  Surgeon: Ronald Lobo, MD;  [facility];  Service: Endoscopy;  Laterality: N/A;  To be performed immediately following colonoscopy   GIVENS CAPSULE STUDY N/A 11/24/2019   Procedure: GIVENS CAPSULE STUDY;  Surgeon: [name], MD;  [facility];  Service: Gastroenterology;  Laterality: N/A;   GIVENS CAPSULE STUDY N/A 11/28/2019   Procedure: GIVENS CAPSULE STUDY;  Surgeon: Wilford Corner, MD;  [facility];  Service: Endoscopy;  Laterality: N/A;    HEMOSTASIS CLIP PLACEMENT  11/19/2019   Procedure: HEMOSTASIS CLIP PLACEMENT;  Surgeon: Ronald Lobo, MD;  [facility];  Service: Endoscopy;;   HEMOSTASIS CLIP PLACEMENT  11/22/2019   Procedure: HEMOSTASIS CLIP PLACEMENT;  Surgeon: Ronald Lobo, MD;  [facility];  Service: Endoscopy;;   HOT HEMOSTASIS N/A 11/24/2019   Procedure: HOT HEMOSTASIS (ARGON PLASMA COAGULATION/BICAP);  Surgeon: [name], MD;  Location: [facility];  Service: Gastroenterology;  Laterality: N/A;   HOT HEMOSTASIS N/A 11/27/2019   Procedure: HOT HEMOSTASIS (ARGON PLASMA COAGULATION/BICAP);  Surgeon: Wilford Corner, MD;  Location: [facility];  Service: Endoscopy;  Laterality: N/A;   SUBMUCOSAL TATTOO INJECTION  11/19/2019   Procedure: SUBMUCOSAL TATTOO INJECTION;  Surgeon: Ronald Lobo, MD;  [facility];  Service: Endoscopy;;    Family History  Problem Relation Age of Onset   Heart disease Father        MVP and Pics Valve   Hypertension Father    Depression Father        Institutionalized x's 2 years   Bipolar disorder Father    Hypertension Sister    Diabetes Sister    Hyperlipidemia Sister    Heart disease Sister 4       MI   Heart disease Brother    Hypertension Brother    Heart disease Paternal Grandmother    Heart disease Paternal Aunt    Heart disease Paternal Uncle    Schizophrenia Paternal Aunt    Asthma Son    Asthma Son     Social History   Socioeconomic History   Marital status: Divorced    Spouse name: Not on file   Number of children: Not on file   Years of education: Not on file   Highest education level: Not on file  Occupational History   Occupation: disabled- [employer]  Tobacco Use   Smoking status: Former    Packs/day: 1.00    Years: 40.00    Pack years: 40.00    Types: Cigarettes    Start date: 07/21/1972    Quit date: 10/16/2011    Years since quitting: 9.7   Smokeless tobacco: Never  Vaping Use   Vaping Use: Never used   Substance and Sexual Activity   Alcohol use: Not Currently    Comment: Occ-- Wine   Drug use: No   Sexual activity: Not on file  Other Topics Concern   Not on file  Social History Narrative   [facility] (04/06/17):   Originally from [place]. Has also lived in [place], [place]. She also previously lived in [place] for 20 years. No history of [place]. Moved to [place] in 1989. 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 No pets currently. No bird exposure. Questionable previous mold exposure in her daughter's home. 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    Social Determinants of Health  Financial Resource Strain: Not on file  Food Insecurity: Not on file  Transportation Needs: Not on file  Physical Activity: Not on file  Stress: Not on file  Social Connections: Not on file  Intimate Partner Violence: Not on file    Outpatient Medications Prior to Visit  Medication Sig Dispense Refill   Accu-Chek FastClix Lancets MISC USE TO CHECK BLOOD SUGAR UP TO FOUR TIMES DAILY AS DIRECTED (Patient taking differently: See admin instructions. Up to 4 times a day) 306 each 0   ACCU-CHEK GUIDE test strip USE 1 STRIP TO CHECK GLUCOSE UP TO 4 TIMES DAILY 100 each 0   albuterol (PROVENTIL HFA;VENTOLIN HFA) 108 (90 Base) MCG/ACT inhaler Inhale 1-2 puffs into the lungs every 6 (six) hours as needed for wheezing or shortness of breath.     albuterol (PROVENTIL) (2.5 MG/3ML) 0.083% nebulizer solution Take 3 mLs (2.5 mg total) by nebulization every 6 (six) hours as needed for wheezing or shortness of breath. 150 mL 1   blood glucose meter kit and supplies KIT Dispense based on patient and insurance preference. Use up to four times daily as directed. (FOR ICD-9 250.00, 250.01). 1 each 0   busPIRone (BUSPAR) 15  MG tablet Take 15 mg by mouth at bedtime.   4   Dulaglutide (TRULICITY) 8.27 MB/8.6LJ SOPN Inject 0.75 mg into the skin once a week. 2 mL 3   famotidine (PEPCID) 20 MG tablet Take 20 mg by mouth daily as needed for heartburn or indigestion.     hydrALAZINE (APRESOLINE) 25 MG tablet Take 0.5 tablets (12.5 mg total) by mouth 3 (three) times daily. 135 tablet 3   insulin glargine, 1 Unit Dial, (TOUJEO SOLOSTAR) 300 UNIT/ML Solostar Pen Inject 40 Units into the skin daily in the afternoon. 15 mL 3   Insulin Pen Needle 29G X 8MM MISC 1 Device by Does not apply route daily in the afternoon. 100 each 3   LORazepam (ATIVAN) 1 MG tablet Take 0.5-1.5 mg by mouth See admin instructions. Take 1-1.5 mg by mouth at bedtime and an additional 0.5 mg once a day as needed for anxiety     Magnesium 400 MG CAPS Take 1 capsule by mouth daily. 30 capsule 0   metoprolol succinate (TOPROL-XL) 50 MG 24 hr tablet TAKE 2 TABLETS BY MOUTH ONCE DAILY. TAKE IMMEDIATELY FOLLOWING A MEAL 180 tablet 0   NONFORMULARY OR COMPOUNDED ITEM Pt/ inr   Dx dvt   Tomorrow 04/05/2019  Please call office [phone] with results 1 each 0   NONFORMULARY OR COMPOUNDED ITEM PT/ inr    Dx dvt 1 each 0   NONFORMULARY OR COMPOUNDED ITEM Compression stockings  20-30 mm/hg  #1   Dx low ext edema 1 each 0   olmesartan (BENICAR) 20 MG tablet Take 1 tablet (20 mg total) by mouth daily. 30 tablet 2   OXYGEN Inhale 3-4 L/min into the lungs continuous.      potassium chloride SA (KLOR-CON) 20 MEQ tablet Take 1 tablet by mouth once daily 90 tablet 1   QUEtiapine (SEROQUEL) 100 MG tablet Take 150 mg by mouth at bedtime.     torsemide (DEMADEX) 20 MG tablet Take 3 tablets (60 mg total) by mouth daily. 90 tablet 2   traMADol (ULTRAM) 50 MG tablet Take 1 tablet (50 mg total) by mouth every 6 (six) hours as needed. 15 tablet 0   zolpidem (AMBIEN) 10 MG tablet Take 10 mg by mouth at bedtime.     levothyroxine (  SYNTHROID) 88 MCG tablet Take 1 tablet (88 mcg total)  by mouth daily. 90 tablet 3   No facility-administered medications prior to visit.    Allergies  Allergen Reactions   Hydrocodone Other (See Comments)    Constipation and hallucinations   Norvasc [Amlodipine Besylate] Swelling and Other (See Comments)    Marked swelling of the limbs   Tizanidine Other (See Comments)    After the 3rd dose, the patient's mouth began to feel numb and she felt like she was a having a "hot flash"    Review of Systems  Constitutional:  Negative for fever.  HENT:  Negative for congestion, ear pain, hearing loss, sinus pain and sore throat.   Eyes:  Negative for blurred vision and pain.  Respiratory:  Positive for shortness of breath. Negative for cough, sputum production and wheezing.   Cardiovascular:  Positive for leg swelling. Negative for chest pain and palpitations.  Gastrointestinal:  Negative for blood in stool, constipation, diarrhea, nausea and vomiting.  Genitourinary:  Negative for dysuria, frequency, hematuria and urgency.  Musculoskeletal:  Negative for back pain, falls and myalgias.  Neurological:  Negative for dizziness, sensory change, loss of consciousness, weakness and headaches.  Endo/Heme/Allergies:  Negative for environmental allergies. Does not bruise/bleed easily.  Psychiatric/Behavioral:  Negative for depression and suicidal ideas. The patient is not nervous/anxious and does not have insomnia.       Objective:    Physical Exam Constitutional:      General: She is not in acute distress.    Appearance: Normal appearance. She is not ill-appearing.  HENT:     Head: Normocephalic and atraumatic.     Right Ear: External ear normal.     Left Ear: External ear normal.  Eyes:     Extraocular Movements: Extraocular movements intact.     Pupils: Pupils are equal, round, and reactive to light.  Cardiovascular:     Rate and Rhythm: Normal rate and regular rhythm.     Pulses: Normal pulses.     Heart sounds: Normal heart sounds. No  murmur heard.   No gallop.     Comments: Trace LE bilaterally Pulmonary:     Effort: Pulmonary effort is normal. No respiratory distress.     Breath sounds: Normal breath sounds. No wheezing, rhonchi or rales.  Abdominal:     General: Bowel sounds are normal. There is no distension.     Palpations: Abdomen is soft. There is no mass.     Tenderness: There is no abdominal tenderness. There is no guarding or rebound.     Hernia: No hernia is present.  Musculoskeletal:     Cervical back: Normal range of motion and neck supple.     Right lower leg: Edema present.     Left lower leg: Edema present.  Lymphadenopathy:     Cervical: No cervical adenopathy.  Skin:    General: Skin is warm and dry.  Neurological:     Mental Status: She is alert and oriented to person, place, and time.  Psychiatric:        Behavior: Behavior normal.    BP 128/88 (BP Location: Right Arm, Patient Position: Sitting, Cuff Size: Large)   Pulse 93   Temp 98.8 F (37.1 C) (Oral)   Resp 20   Ht '5\' 3"'  (1.6 m)   Wt 294 lb 9.6 oz (133.6 kg)   SpO2 96%   BMI 52.19 kg/m  Wt Readings from Last 3 Encounters:  07/16/21 294 lb 9.6  oz (133.6 kg)  07/09/21 297 lb (134.7 kg)  05/18/21 292 lb 1.3 oz (132.5 kg)    Diabetic Foot Exam - Simple   No data filed    Lab Results  Component Value Date   WBC 7.2 01/07/2020   HGB 9.3 (L) 01/07/2020   HCT 28.9 (L) 01/07/2020   PLT 327.0 01/07/2020   GLUCOSE 231 (H) 07/09/2021   CHOL 147 04/05/2021   TRIG 150.0 (H) 04/05/2021   HDL 32.00 (L) 04/05/2021   LDLCALC 85 04/05/2021   ALT 13 04/05/2021   AST 20 04/05/2021   NA 138 07/09/2021   K 3.5 07/09/2021   CL 97 (L) 07/09/2021   CREATININE 1.45 (H) 07/09/2021   BUN 11 07/09/2021   CO2 31 07/09/2021   TSH 2.93 02/01/2021   INR 1.2 01/01/2020   HGBA1C 7.5 (H) 04/05/2021    Lab Results  Component Value Date   TSH 2.93 02/01/2021   Lab Results  Component Value Date   WBC 7.2 01/07/2020   HGB 9.3 (L)  01/07/2020   HCT 28.9 (L) 01/07/2020   MCV 82.8 01/07/2020   PLT 327.0 01/07/2020   Lab Results  Component Value Date   NA 138 07/09/2021   K 3.5 07/09/2021   CO2 31 07/09/2021   GLUCOSE 231 (H) 07/09/2021   BUN 11 07/09/2021   CREATININE 1.45 (H) 07/09/2021   BILITOT 0.3 04/05/2021   ALKPHOS 157 (H) 04/05/2021   AST 20 04/05/2021   ALT 13 04/05/2021   PROT 7.9 04/05/2021   ALBUMIN 3.6 04/05/2021   CALCIUM 8.8 07/09/2021   ANIONGAP 11 01/01/2020   EGFR 33 (L) 05/27/2021   GFR 29.53 (L) 04/05/2021   Lab Results  Component Value Date   CHOL 147 04/05/2021   Lab Results  Component Value Date   HDL 32.00 (L) 04/05/2021   Lab Results  Component Value Date   LDLCALC 85 04/05/2021   Lab Results  Component Value Date   TRIG 150.0 (H) 04/05/2021   Lab Results  Component Value Date   CHOLHDL 5 04/05/2021   Lab Results  Component Value Date   HGBA1C 7.5 (H) 04/05/2021       Assessment & Plan:   Problem List Items Addressed This Visit       Unprioritized   Hypothyroidism   Relevant Medications   levothyroxine (SYNTHROID) 88 MCG tablet   Hypokalemia    Recheck bmp      Lower extremity edema - Primary    Elevated legs Compression socks  con't torsemide F/u cardiology       Relevant Orders   Basic metabolic panel   Need for influenza vaccination   Relevant Orders   Flu Vaccine QUAD High Dose(Fluad)    Meds ordered this encounter  Medications   levothyroxine (SYNTHROID) 88 MCG tablet    Sig: Take 1 tablet (88 mcg total) by mouth daily.    Dispense:  90 tablet    Refill:  3    I,Zite Okoli,acting as a Education administrator for [employer], DO.,have documented all relevant documentation on the behalf of Ann Held, DO,as directed by  Ann Held, DO while in the presence of Ann Held, DO.   I,  Ann Held DO. , personally preformed the services described in this documentation.  All medical record entries made by  the scribe were at my direction and in my presence.  I have reviewed the chart and discharge  instructions (if applicable) and agree that the record reflects my personal performance and is accurate and complete. 07/16/2021

## 2021-07-20 ENCOUNTER — Telehealth: Payer: Self-pay | Admitting: Family Medicine

## 2021-07-20 NOTE — Telephone Encounter (Signed)
[facility]: [phone]  Wanted to see if it was okay to switch manufactures on the medication below:  levothyroxine (SYNTHROID) 88 MCG tablet

## 2021-07-21 NOTE — Telephone Encounter (Signed)
Returned call. Left message with pharmacy to confirm change

## 2021-07-26 ENCOUNTER — Encounter: Payer: Medicare HMO | Attending: Internal Medicine | Admitting: Dietician

## 2021-07-28 ENCOUNTER — Telehealth: Payer: Self-pay | Admitting: Family Medicine

## 2021-07-28 NOTE — Telephone Encounter (Signed)
Patient called asking for a physiatrist referral since her old one from [facility] no longer sees patients from [place]. Please advice.

## 2021-07-29 NOTE — Telephone Encounter (Signed)
Do you want me to send the list?

## 2021-07-30 NOTE — Telephone Encounter (Signed)
Pt states psychiatrist. She has been seeing him for 10 years and states no longer seeing patients in this area. Do you have a recommendation?

## 2021-08-02 ENCOUNTER — Telehealth: Payer: Self-pay | Admitting: Family Medicine

## 2021-08-02 NOTE — Telephone Encounter (Signed)
List placed in mail

## 2021-08-02 NOTE — Telephone Encounter (Signed)
Pt wanting to know if lowne suggests a new psychiatrist  The one she is wanting to see is not seeing new patients  Please advise

## 2021-08-02 NOTE — Telephone Encounter (Signed)
Spoke with pt. Pt given numbers to [facility] and [facility]. Pt also aware Behavioral Health list has been mailed.

## 2021-08-23 ENCOUNTER — Ambulatory Visit: Payer: Medicare HMO | Admitting: Behavioral Health

## 2021-08-23 ENCOUNTER — Encounter: Payer: Self-pay | Admitting: Behavioral Health

## 2021-08-23 ENCOUNTER — Other Ambulatory Visit: Payer: Self-pay

## 2021-08-23 VITALS — BP 176/107 | HR 94 | Ht 63.0 in | Wt 290.0 lb

## 2021-08-23 DIAGNOSIS — F5105 Insomnia due to other mental disorder: Secondary | ICD-10-CM

## 2021-08-23 DIAGNOSIS — F411 Generalized anxiety disorder: Secondary | ICD-10-CM

## 2021-08-23 DIAGNOSIS — F331 Major depressive disorder, recurrent, moderate: Secondary | ICD-10-CM

## 2021-08-23 DIAGNOSIS — F3131 Bipolar disorder, current episode depressed, mild: Secondary | ICD-10-CM

## 2021-08-23 DIAGNOSIS — F99 Mental disorder, not otherwise specified: Secondary | ICD-10-CM

## 2021-08-23 MED ORDER — QUETIAPINE FUMARATE 100 MG PO TABS: 150.0000 mg | ORAL_TABLET | Freq: Every day | ORAL | 3 refills | Status: DC

## 2021-08-23 MED ORDER — ZOLPIDEM TARTRATE ER 12.5 MG PO TBCR: 12.5000 mg | EXTENDED_RELEASE_TABLET | Freq: Every evening | ORAL | 0 refills | Status: DC | PRN

## 2021-08-23 MED ORDER — LORAZEPAM 1 MG PO TABS: 0.5000 mg | ORAL_TABLET | ORAL | 3 refills | Status: DC

## 2021-08-23 MED ORDER — BUSPIRONE HCL 15 MG PO TABS: 15.0000 mg | ORAL_TABLET | Freq: Two times a day (BID) | ORAL | 3 refills | Status: DC

## 2021-08-23 NOTE — Progress Notes (Signed)
[facility] MD/PA/NP Initial Note  08/23/2021 11:44 PM Sue [name]  MRN:  [id number]  Chief Complaint:  Chief Complaint   Depression; Anxiety; Establish Care; Medication Problem; Medication Refill     HPI: 70 year old female presents to this office for initial visit and to establish care. She is ambulating with rollator and currently on full time 02, 4-5 liters per min. She is very pleasant and candid. She says that she had been receiving care from Dr. Laverta Baltimore at [facility] for over 10 years. They recently told her that they would no longer be seeing patients form the [place] area in the [place] location. Said she was directed to the [place] location and said it was not a good fit for her. Says that her PCP  referred her to this office. She says that Dr. Laverta Baltimore had recently diagnosed her with Bipolar disorder before she stopped seeing him. Says that she suffered from anxiety and depression since her 80's. She is currently happy with her medications and does not want to make any changes at this time. She expresses importance of being able to get refills on time due to her physical health and not being able to get out often. She is currently living in assisted living facility.  Currently her anxiety level is 4/10 and depression is 3/10 but says that health issues keep her at this level. She does sleep 7-8 hours per night with aid of Ambien 12.5 mg CR.  She denies having any problems taking this medication. Denies any current mania which she said was dominate when she was younger. No psychosis, no SI/HI. She has strong family hx of bipolar and schizophrenia on fathers side.   No Prior medication trials noted this visit due to time. Obtain next visit.   Visit Diagnosis:    ICD-10-CM   1. Bipolar affective disorder, currently depressed, mild (HCC)  F31.31 QUEtiapine (SEROQUEL) 100 MG tablet    2. Major depressive disorder, recurrent episode, moderate (HCC)  F33.1 QUEtiapine (SEROQUEL) 100 MG tablet     3. Insomnia due to other mental disorder  F51.05 zolpidem (AMBIEN CR) 12.5 MG CR tablet   F99     4. Generalized anxiety disorder  F41.1 busPIRone (BUSPAR) 15 MG tablet    LORazepam (ATIVAN) 1 MG tablet      Past Psychiatric History: anxiety, depression, bipolar  Past Medical History:  Past Medical History:  Diagnosis Date   Allergic rhinitis    Depression    Diabetes mellitus type 2, uncontrolled    Emphysema of lung ([place])    3L home O2   GERD (gastroesophageal reflux disease)    Hypertension    Hypothyroidism    Obesity, morbid, BMI 50 or higher ([place])    Stroke ([place]) 2016   TIA    Urine incontinence     Past Surgical History:  Procedure Laterality Date   ABDOMINAL HYSTERECTOMY     CESAREAN SECTION     COLONOSCOPY N/A 08/19/2019   Procedure: COLONOSCOPY;  Surgeon: Clarene Essex, MD;  Location: [facility];  Service: Endoscopy;  Laterality: N/A;   COLONOSCOPY WITH PROPOFOL N/A 08/05/2019   Procedure: COLONOSCOPY WITH PROPOFOL;  Surgeon: Wilford Corner, MD;  [facility];  Service: Gastroenterology;  Laterality: N/A;   COLONOSCOPY WITH PROPOFOL N/A 11/19/2019   Procedure: COLONOSCOPY WITH PROPOFOL;  Surgeon: Ronald Lobo, MD;  [facility];  Service: Endoscopy;  Laterality: N/A;  Unprepped   COLONOSCOPY WITH PROPOFOL N/A 11/22/2019   Procedure: COLONOSCOPY WITH PROPOFOL;  Surgeon: Ronald Lobo, MD;  Location[facility];  Service: Endoscopy;  Laterality: N/A;   COLONOSCOPY WITH PROPOFOL N/A 11/23/2019   Procedure: COLONOSCOPY WITH PROPOFOL;  Surgeon: [name], MD;  [facility];  Service: Gastroenterology;  Laterality: N/A;   COLONOSCOPY WITH PROPOFOL N/A 11/29/2019   Procedure: COLONOSCOPY WITH PROPOFOL;  Surgeon: Wilford Corner, MD;  [facility];  Service: Endoscopy;  Laterality: N/A;   ENTEROSCOPY N/A 11/24/2019   Procedure: ENTEROSCOPY;  Surgeon: [name], MD;  [facility];  Service: Gastroenterology;   Laterality: N/A;   ENTEROSCOPY N/A 11/27/2019   Procedure: ENTEROSCOPY;  Surgeon: Wilford Corner, MD;  [facility];  Service: Endoscopy;  Laterality: N/A;   ESOPHAGOGASTRODUODENOSCOPY N/A 11/27/2019   Procedure: ESOPHAGOGASTRODUODENOSCOPY (EGD);  Surgeon: Wilford Corner, MD;  Location: [facility];  Service: Endoscopy;  Laterality: N/A;   ESOPHAGOGASTRODUODENOSCOPY (EGD) WITH PROPOFOL N/A 11/24/2019   Procedure: ESOPHAGOGASTRODUODENOSCOPY (EGD) WITH PROPOFOL;  Surgeon: [name], MD;  [facility];  Service: Gastroenterology;  Laterality: N/A;  PUSH enteroscopy   GIVENS CAPSULE STUDY N/A 11/19/2019   Procedure: GIVENS CAPSULE STUDY;  Surgeon: Ronald Lobo, MD;  [facility];  Service: Endoscopy;  Laterality: N/A;  To be performed immediately following colonoscopy   GIVENS CAPSULE STUDY N/A 11/24/2019   Procedure: GIVENS CAPSULE STUDY;  Surgeon: [name], MD;  [facility];  Service: Gastroenterology;  Laterality: N/A;   GIVENS CAPSULE STUDY N/A 11/28/2019   Procedure: GIVENS CAPSULE STUDY;  Surgeon: Wilford Corner, MD;  [facility];  Service: Endoscopy;  Laterality: N/A;   HEMOSTASIS CLIP PLACEMENT  11/19/2019   Procedure: HEMOSTASIS CLIP PLACEMENT;  Surgeon: Ronald Lobo, MD;  [facility];  Service: Endoscopy;;   HEMOSTASIS CLIP PLACEMENT  11/22/2019   Procedure: HEMOSTASIS CLIP PLACEMENT;  Surgeon: Ronald Lobo, MD;  [facility];  Service: Endoscopy;;   HOT HEMOSTASIS N/A 11/24/2019   Procedure: HOT HEMOSTASIS (ARGON PLASMA COAGULATION/BICAP);  Surgeon: [name], MD;  Location: [facility];  Service: Gastroenterology;  Laterality: N/A;   HOT HEMOSTASIS N/A 11/27/2019   Procedure: HOT HEMOSTASIS (ARGON PLASMA COAGULATION/BICAP);  Surgeon: Wilford Corner, MD;  Location: [facility];  Service: Endoscopy;  Laterality: N/A;   SUBMUCOSAL TATTOO INJECTION  11/19/2019   Procedure: SUBMUCOSAL TATTOO INJECTION;   Surgeon: Ronald Lobo, MD;  [facility];  Service: Endoscopy;;    Family Psychiatric History: bipolar, depression, ETOH, Schizophrenia  Family History:  Family History  Problem Relation Age of Onset   Heart disease Father        MVP and Pics Valve   Hypertension Father    Depression Father        Institutionalized x's 2 years   Bipolar disorder Father    Hypertension Sister    Diabetes Sister    Hyperlipidemia Sister    Heart disease Sister 41       MI   Heart disease Brother    Hypertension Brother    Schizophrenia Paternal Aunt    Depression Paternal Aunt    Anxiety disorder Paternal Aunt    Heart disease Paternal Aunt    Schizophrenia Paternal Aunt    Heart disease Paternal Uncle    Heart disease Paternal Grandmother    Asthma Son    Asthma Son     Social History:  Social History   Socioeconomic History   Marital status: Divorced    Spouse name: Not on file   Number of children: 4   Years of education: 77  Highest education level: Associate degree: academic program  Occupational History   Occupation: disabled- [employer]  Tobacco Use   Smoking status: Former    Packs/day: 1.00    Years: 40.00    Pack years: 40.00    Types: Cigarettes    Start date: 07/21/1972    Quit date: 10/16/2011    Years since quitting: 9.8   Smokeless tobacco: Never  Vaping Use   Vaping Use: Never used  Substance and Sexual Activity   Alcohol use: Not Currently    Comment: Occ-- Wine   Drug use: No   Sexual activity: Not Currently  Other Topics Concern   Not on file  Social History Narrative      Originally from [place]. Has also lived in [place], [place]. She also previously lived in [place] for 20 years. No history of [place] Fever. Moved to [place] in 1989. 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 No pets currently. No bird exposure. Questionable previous mold exposure in her daughter's home. 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       Lives in [place] alone in assisted living.  Has multiple chronic illness.   Social Determinants of Health   Financial Resource Strain: Not on file  Food Insecurity: Not on file  Transportation Needs: Not on file  Physical Activity: Not on file  Stress: Not on file  Social Connections: Not on file    Allergies:  Allergies  Allergen Reactions   Hydrocodone Other (See Comments)    Constipation and hallucinations   Norvasc [Amlodipine Besylate] Swelling and Other (See Comments)    Marked swelling of the limbs   Tizanidine Other (See Comments)    After the 3rd dose, the patient's mouth began to feel numb and she felt like she was a having a "hot flash"    Metabolic Disorder Labs: Lab Results  Component Value Date   HGBA1C 7.5 (H) 04/05/2021   MPG 200 05/28/2020   MPG 180 11/13/2019   No results found for: PROLACTIN Lab Results  Component Value Date   CHOL 147 04/05/2021   TRIG 150.0 (H) 04/05/2021   HDL 32.00 (L) 04/05/2021   CHOLHDL 5 04/05/2021   VLDL 30.0 04/05/2021   LDLCALC 85 04/05/2021   LDLCALC 74 02/01/2021   Lab Results  Component Value Date   TSH 2.93 02/01/2021   TSH 4.58 (H) 05/28/2020    Therapeutic Level Labs: No results found for: LITHIUM No results found for: VALPROATE No components found for:  CBMZ  Current Medications: Current Outpatient Medications  Medication Sig Dispense Refill   ACCU-CHEK GUIDE test strip USE 1 STRIP TO CHECK GLUCOSE UP TO 4 TIMES DAILY 100 each 0   albuterol (PROVENTIL HFA;VENTOLIN HFA) 108 (90 Base) MCG/ACT inhaler Inhale 1-2 puffs into the lungs every 6 (six) hours as needed for wheezing or shortness of breath.     albuterol (PROVENTIL) (2.5 MG/3ML) 0.083% nebulizer solution Take 3 mLs (2.5 mg total) by nebulization  every 6 (six) hours as needed for wheezing or shortness of breath. 150 mL 1   blood glucose meter kit and supplies KIT Dispense based on patient and insurance preference. Use up to four times daily as directed. (FOR ICD-9 250.00, 250.01). 1 each 0   Dulaglutide (  TRULICITY) 6.41 RA/3.0NM SOPN Inject 0.75 mg into the skin once a week. 2 mL 3   famotidine (PEPCID) 20 MG tablet Take 20 mg by mouth daily as needed for heartburn or indigestion.     insulin glargine, 1 Unit Dial, (TOUJEO SOLOSTAR) 300 UNIT/ML Solostar Pen Inject 40 Units into the skin daily in the afternoon. 15 mL 3   Insulin Pen Needle 29G X 8MM MISC 1 Device by Does not apply route daily in the afternoon. 100 each 3   levothyroxine (SYNTHROID) 88 MCG tablet Take 1 tablet (88 mcg total) by mouth daily. 90 tablet 3   Magnesium 400 MG CAPS Take 1 capsule by mouth daily. 30 capsule 0   metoprolol succinate (TOPROL-XL) 50 MG 24 hr tablet TAKE 2 TABLETS BY MOUTH ONCE DAILY. TAKE IMMEDIATELY FOLLOWING A MEAL 180 tablet 0   NONFORMULARY OR COMPOUNDED ITEM Pt/ inr   Dx dvt   Tomorrow 04/05/2019  Please call office [phone] with results 1 each 0   NONFORMULARY OR COMPOUNDED ITEM PT/ inr    Dx dvt 1 each 0   NONFORMULARY OR COMPOUNDED ITEM Compression stockings  20-30 mm/hg  #1   Dx low ext edema 1 each 0   olmesartan (BENICAR) 20 MG tablet Take 1 tablet (20 mg total) by mouth daily. 30 tablet 2   OXYGEN Inhale 3-4 L/min into the lungs continuous.      potassium chloride SA (KLOR-CON) 20 MEQ tablet Take 1 tablet by mouth once daily 90 tablet 1   torsemide (DEMADEX) 20 MG tablet Take 3 tablets (60 mg total) by mouth daily. 90 tablet 2   zolpidem (AMBIEN CR) 12.5 MG CR tablet Take 1 tablet (12.5 mg total) by mouth at bedtime as needed for sleep. 30 tablet 0   Accu-Chek FastClix Lancets MISC USE TO CHECK BLOOD SUGAR UP TO FOUR TIMES DAILY AS DIRECTED (Patient taking differently: See admin instructions. Up to 4 times a day) 306 each 0   allopurinol  (ZYLOPRIM) 100 MG tablet      busPIRone (BUSPAR) 15 MG tablet Take 1 tablet (15 mg total) by mouth 2 (two) times daily. 60 tablet 3   hydrALAZINE (APRESOLINE) 25 MG tablet Take 0.5 tablets (12.5 mg total) by mouth 3 (three) times daily. 135 tablet 3   LORazepam (ATIVAN) 1 MG tablet Take 0.5-1.5 tablets (0.5-1.5 mg total) by mouth See admin instructions. Take 1-1.5 mg by mouth at bedtime and an additional 0.5 mg once a day as needed for anxiety 30 tablet 3   QUEtiapine (SEROQUEL) 100 MG tablet Take 1.5 tablets (150 mg total) by mouth at bedtime. 45 tablet 3   traMADol (ULTRAM) 50 MG tablet Take 1 tablet (50 mg total) by mouth every 6 (six) hours as needed. (Patient not taking: Reported on 08/23/2021) 15 tablet 0   zolpidem (AMBIEN) 10 MG tablet Take 10 mg by mouth at bedtime.     No current facility-administered medications for this visit.    Medication Side Effects: anxiety  Orders placed this visit:  No orders of the defined types were placed in this encounter.   Psychiatric Specialty Exam:  Review of Systems  Constitutional:  Positive for diaphoresis and fatigue.  HENT:  Positive for sinus pain and sore throat.   Eyes:  Positive for visual disturbance.  Respiratory:  Positive for cough, shortness of breath and wheezing.   Cardiovascular:  Positive for palpitations.  Gastrointestinal:  Positive for abdominal pain, blood in stool, constipation, diarrhea and nausea.  Genitourinary:  Positive for frequency and urgency.  Musculoskeletal:  Positive for back pain, joint swelling and neck pain.  Allergic/Immunologic: Positive for environmental allergies.  Neurological:  Positive for dizziness, weakness and headaches.  Hematological:  Bruises/bleeds easily.  Psychiatric/Behavioral:  Positive for dysphoric mood. The patient is nervous/anxious.    Blood pressure (!) 176/107, pulse 94, height '5\' 3"'  (1.6 m), weight 290 lb (131.5 kg).Body mass index is 51.37 kg/m.  General Appearance: Casual,  Neat, Well Groomed, Obese, and in  Rollator,  on full-time 02  4-5 liters per min.  Eye Contact:  Good  Speech:  Clear and Coherent  Volume:  Normal  Mood:  NA  Affect:  Appropriate  Thought Process:  Coherent  Orientation:  Full (Time, Place, and Person)  Thought Content: Logical   Suicidal Thoughts:  No  Homicidal Thoughts:  No  Memory:  WNL  Judgement:  Good  Insight:  Good  Psychomotor Activity:  Normal  Concentration:  Concentration: Good  Recall:  Good  Fund of Knowledge: Good  Language: Good  Assets:  Desire for Improvement  ADL's:  Intact  Cognition: WNL  Prognosis:  Good   Screenings:  PHQ2-9    [facility] Office Visit from 02/01/2021 in [facility] [place] Visit from 08/01/2016 in [facility] Disease Patient Outreach from 11/23/2015 in [facility] Patient Outreach from 11/20/2015 in [facility] Patient Outreach Telephone from 11/19/2015 in [facility]  PHQ-2 Total Score 0 0 0 1 1       Receiving Psychotherapy: No   Treatment Plan/Recommendations:  Greater than 50% of  60 face to face time with patient was spent on counseling and coordination of care. We discussed extensive and long hx of mood disorder with anxiety and depression. Discussed her strong family history of mental health disorders to include schizophrenia. Discussed her past tx and care under Dr Laverta Baltimore and her current goals for tx here.  Today we agreed to: Continue Zolpidem 12.5 mg CR Continue Buspar 15 mg twice daily Continue Ativan 0.5-1.5 tablets  (1 mg 0.5-1.5 mg total) by mouth. Take 1-1.5 mg at bedtime and an additional 0.5 mg once a day needed for anxiety. Continue Seroquel 150 mg daily at bedtime To contact this office if worsening symptoms Will follow up in 6 weeks to reassess. Provided emergency contact information Reviewed PDMP     Elwanda Brooklyn, NP

## 2021-08-25 ENCOUNTER — Telehealth: Payer: Self-pay | Admitting: Family Medicine

## 2021-08-25 NOTE — Telephone Encounter (Signed)
Pharmacy notified pt they need approval for a new manufacturer of levothyroxine (SYNTHROID) 88 MCG tablet sent to     [facility]  [place], [place] [address]  Phone:  [phone]  Fax:  [phone]

## 2021-08-27 NOTE — Telephone Encounter (Signed)
Verbal given 

## 2021-08-31 ENCOUNTER — Telehealth: Payer: Self-pay | Admitting: Family Medicine

## 2021-08-31 NOTE — Telephone Encounter (Signed)
pt stated she lost instructions to a med called novolin and would like for someone to return her phone call to review instructions. please advise.

## 2021-09-01 NOTE — Telephone Encounter (Signed)
Pt called. LVM to return call 

## 2021-09-01 NOTE — Telephone Encounter (Signed)
It looks like the Novolin was discontinued on 05/18/2021 and patient was supposed to start Toujeo 40 units daily and Trulicity 4.08 mg ONCE weekly. Is this correct? Please advise

## 2021-09-17 ENCOUNTER — Other Ambulatory Visit: Payer: Self-pay | Admitting: Behavioral Health

## 2021-09-17 ENCOUNTER — Telehealth: Payer: Self-pay | Admitting: Behavioral Health

## 2021-09-17 DIAGNOSIS — F5105 Insomnia due to other mental disorder: Secondary | ICD-10-CM

## 2021-09-17 DIAGNOSIS — F411 Generalized anxiety disorder: Secondary | ICD-10-CM

## 2021-09-17 DIAGNOSIS — F99 Mental disorder, not otherwise specified: Secondary | ICD-10-CM

## 2021-09-17 MED ORDER — LORAZEPAM 1 MG PO TABS: 0.5000 mg | ORAL_TABLET | ORAL | 3 refills | Status: DC

## 2021-09-17 MED ORDER — ZOLPIDEM TARTRATE ER 12.5 MG PO TBCR: 12.5000 mg | EXTENDED_RELEASE_TABLET | Freq: Every evening | ORAL | 3 refills | Status: DC | PRN

## 2021-09-17 NOTE — Telephone Encounter (Signed)
Last filled 12/7 rx were sent but pharmacy stated they don't have them please resend

## 2021-09-17 NOTE — Telephone Encounter (Signed)
I just sent Scripts for Lorazepam and Zolpidem were [facility] to [facility] [address]

## 2021-09-17 NOTE — Telephone Encounter (Signed)
Pt stated that pharmacy does not have the scripts for Zolpidem nor Lorazepam.  But they have the other 2 scripts Aaron Edelman sent on the same day.  [facility] [address].  Next appt 1/23

## 2021-10-04 ENCOUNTER — Ambulatory Visit (INDEPENDENT_AMBULATORY_CARE_PROVIDER_SITE_OTHER): Payer: Medicare HMO | Admitting: Behavioral Health

## 2021-10-04 ENCOUNTER — Encounter: Payer: Self-pay | Admitting: Behavioral Health

## 2021-10-04 ENCOUNTER — Other Ambulatory Visit: Payer: Self-pay

## 2021-10-04 DIAGNOSIS — F331 Major depressive disorder, recurrent, moderate: Secondary | ICD-10-CM | POA: Diagnosis not present

## 2021-10-04 DIAGNOSIS — F99 Mental disorder, not otherwise specified: Secondary | ICD-10-CM

## 2021-10-04 DIAGNOSIS — F3131 Bipolar disorder, current episode depressed, mild: Secondary | ICD-10-CM

## 2021-10-04 DIAGNOSIS — F5105 Insomnia due to other mental disorder: Secondary | ICD-10-CM

## 2021-10-04 DIAGNOSIS — F411 Generalized anxiety disorder: Secondary | ICD-10-CM | POA: Diagnosis not present

## 2021-10-04 NOTE — Progress Notes (Signed)
[facility] Check  Patient ID: Sue [name],  MRN: [id number]  PCP: Ann Held, DO  Date of Evaluation: 10/04/2021 Time spent:30 minutes  Chief Complaint:  Chief Complaint   Depression; Anxiety; Medication Refill; Follow-up; Patient Education     HISTORY/CURRENT STATUS: HPI 71 year old female presents to this office for follow up and medication management.. She is ambulating with rollator and currently on full time 02, 4-5 liters per min. She is very pleasant and candid. She says that she had been receiving care from Dr. Laverta Baltimore at [facility] for over 10 years. She continues to live in assisted living facility. She says that she is having some occasional depressive episodes but not at the level she wants to add or change her medications. She agrees she is fairly stable but has more physical health concerns right now. She is requesting video visit next time because it is so hard on her to get here on the 02 and family has to take off work. Currently her anxiety level is 3/10 and depression is 3/10 but says that health issues keep her at this level. She does sleep 7-8 hours per night with aid of Ambien 12.5 mg CR.  She denies having any problems taking this medication. Denies any current mania which she said was dominate when she was younger. No psychosis, no SI/HI. She has strong family hx of bipolar and schizophrenia on fathers side.    No Prior medication trials noted this visit due to time. Obtain next visit.    Individual Medical History/ Review of Systems: Changes? :No   Allergies: Hydrocodone, Norvasc [amlodipine besylate], and Tizanidine  Current Medications:  Current Outpatient Medications:    Accu-Chek FastClix Lancets MISC, USE TO CHECK BLOOD SUGAR UP TO FOUR TIMES DAILY AS DIRECTED (Patient taking differently: See admin instructions. Up to 4 times a day), Disp: 306 each, Rfl: 0   ACCU-CHEK GUIDE test strip, USE 1 STRIP TO CHECK GLUCOSE UP TO 4 TIMES DAILY, Disp:  100 each, Rfl: 0   albuterol (PROVENTIL HFA;VENTOLIN HFA) 108 (90 Base) MCG/ACT inhaler, Inhale 1-2 puffs into the lungs every 6 (six) hours as needed for wheezing or shortness of breath., Disp: , Rfl:    albuterol (PROVENTIL) (2.5 MG/3ML) 0.083% nebulizer solution, Take 3 mLs (2.5 mg total) by nebulization every 6 (six) hours as needed for wheezing or shortness of breath., Disp: 150 mL, Rfl: 1   allopurinol (ZYLOPRIM) 100 MG tablet, , Disp: , Rfl:    blood glucose meter kit and supplies KIT, Dispense based on patient and insurance preference. Use up to four times daily as directed. (FOR ICD-9 250.00, 250.01)., Disp: 1 each, Rfl: 0   busPIRone (BUSPAR) 15 MG tablet, Take 1 tablet (15 mg total) by mouth 2 (two) times daily., Disp: 60 tablet, Rfl: 3   Dulaglutide (TRULICITY) 7.86 LJ/4.4BE SOPN, Inject 0.75 mg into the skin once a week., Disp: 2 mL, Rfl: 3   famotidine (PEPCID) 20 MG tablet, Take 20 mg by mouth daily as needed for heartburn or indigestion., Disp: , Rfl:    hydrALAZINE (APRESOLINE) 25 MG tablet, Take 0.5 tablets (12.5 mg total) by mouth 3 (three) times daily., Disp: 135 tablet, Rfl: 3   insulin glargine, 1 Unit Dial, (TOUJEO SOLOSTAR) 300 UNIT/ML Solostar Pen, Inject 40 Units into the skin daily in the afternoon., Disp: 15 mL, Rfl: 3   Insulin Pen Needle 29G X 8MM MISC, 1 Device by Does not apply route daily in the afternoon., Disp:  100 each, Rfl: 3   levothyroxine (SYNTHROID) 88 MCG tablet, Take 1 tablet (88 mcg total) by mouth daily., Disp: 90 tablet, Rfl: 3   LORazepam (ATIVAN) 1 MG tablet, Take 0.5-1.5 tablets (0.5-1.5 mg total) by mouth See admin instructions. Take 1-1.5 mg by mouth at bedtime and an additional 0.5 mg once a day as needed for anxiety, Disp: 30 tablet, Rfl: 3   Magnesium 400 MG CAPS, Take 1 capsule by mouth daily., Disp: 30 capsule, Rfl: 0   metoprolol succinate (TOPROL-XL) 50 MG 24 hr tablet, TAKE 2 TABLETS BY MOUTH ONCE DAILY. TAKE IMMEDIATELY FOLLOWING A MEAL, Disp:  180 tablet, Rfl: 0   NONFORMULARY OR COMPOUNDED ITEM, Pt/ inr   Dx dvt   Tomorrow 04/05/2019  Please call office [phone] with results, Disp: 1 each, Rfl: 0   NONFORMULARY OR COMPOUNDED ITEM, PT/ inr    Dx dvt, Disp: 1 each, Rfl: 0   NONFORMULARY OR COMPOUNDED ITEM, Compression stockings  20-30 mm/hg  #1   Dx low ext edema, Disp: 1 each, Rfl: 0   olmesartan (BENICAR) 20 MG tablet, Take 1 tablet (20 mg total) by mouth daily., Disp: 30 tablet, Rfl: 2   OXYGEN, Inhale 3-4 L/min into the lungs continuous. , Disp: , Rfl:    potassium chloride SA (KLOR-CON) 20 MEQ tablet, Take 1 tablet by mouth once daily, Disp: 90 tablet, Rfl: 1   QUEtiapine (SEROQUEL) 100 MG tablet, Take 1.5 tablets (150 mg total) by mouth at bedtime., Disp: 45 tablet, Rfl: 3   torsemide (DEMADEX) 20 MG tablet, Take 3 tablets (60 mg total) by mouth daily., Disp: 90 tablet, Rfl: 2   traMADol (ULTRAM) 50 MG tablet, Take 1 tablet (50 mg total) by mouth every 6 (six) hours as needed. (Patient not taking: Reported on 08/23/2021), Disp: 15 tablet, Rfl: 0   zolpidem (AMBIEN CR) 12.5 MG CR tablet, Take 1 tablet (12.5 mg total) by mouth at bedtime as needed for sleep., Disp: 30 tablet, Rfl: 3   zolpidem (AMBIEN) 10 MG tablet, Take 10 mg by mouth at bedtime., Disp: , Rfl:  Medication Side Effects: none  Family Medical/ Social History: Changes? No  MENTAL HEALTH EXAM:  There were no vitals taken for this visit.There is no height or weight on file to calculate BMI.  General Appearance: Casual, Neat, Well Groomed, and using rollator  Eye Contact:  Good  Speech:  Clear and Coherent  Volume:  Normal  Mood:  NA  Affect:  Appropriate and Congruent  Thought Process:  Coherent  Orientation:  Full (Time, Place, and Person)  Thought Content: Logical   Suicidal Thoughts:  No  Homicidal Thoughts:  No  Memory:  WNL  Judgement:  Good  Insight:  Good  Psychomotor Activity:  Normal  Concentration:  Concentration: Good  Recall:  Good  Fund of  Knowledge: Good  Language: Good  Assets:  Desire for Improvement  ADL's:  Intact  Cognition: WNL  Prognosis:  Good    DIAGNOSES:    ICD-10-CM   1. Insomnia due to other mental disorder  F51.05    F99     2. Bipolar affective disorder, currently depressed, mild ([place])  F31.31     3. Generalized anxiety disorder  F41.1     4. Major depressive disorder, recurrent episode, moderate (HCC)  F33.1       Receiving Psychotherapy: No    RECOMMENDATIONS:   Greater than 50% of  60 face to face time with patient was spent on  counseling and coordination of care. We reviewed her extensive and long hx of mood disorder with anxiety and depression. Discussed her current state of stability with occasional depressive episode. She does not want to to add or change medications at this time. Today we agreed to: Continue Zolpidem 12.5 mg CR Continue Buspar 15 mg twice daily Continue Ativan 0.5-1.5 tablets  (1 mg 0.5-1.5 mg total) by mouth. Take 1-1.5 mg at bedtime and an additional 0.5 mg once a day needed for anxiety. Continue Seroquel 150 mg daily at bedtime To contact this office if worsening symptoms Will follow up in 4 months to reassess. She is requesting video visit next time due to ambulation and transportation issues. Provided emergency contact information Reviewed PDMP      Elwanda [place], NP

## 2021-10-12 ENCOUNTER — Other Ambulatory Visit: Payer: Self-pay | Admitting: Behavioral Health

## 2021-10-12 DIAGNOSIS — F3131 Bipolar disorder, current episode depressed, mild: Secondary | ICD-10-CM

## 2021-10-12 DIAGNOSIS — F331 Major depressive disorder, recurrent, moderate: Secondary | ICD-10-CM

## 2021-10-15 ENCOUNTER — Other Ambulatory Visit: Payer: Self-pay | Admitting: Behavioral Health

## 2021-10-15 ENCOUNTER — Telehealth: Payer: Self-pay | Admitting: Physician Assistant

## 2021-10-15 ENCOUNTER — Other Ambulatory Visit: Payer: Self-pay | Admitting: Psychiatry

## 2021-10-15 ENCOUNTER — Other Ambulatory Visit: Payer: Self-pay | Admitting: Family Medicine

## 2021-10-15 DIAGNOSIS — F5105 Insomnia due to other mental disorder: Secondary | ICD-10-CM

## 2021-10-15 DIAGNOSIS — E876 Hypokalemia: Secondary | ICD-10-CM

## 2021-10-15 MED ORDER — ZOLPIDEM TARTRATE ER 12.5 MG PO TBCR: 12.5000 mg | EXTENDED_RELEASE_TABLET | Freq: Every evening | ORAL | 0 refills | Status: DC | PRN

## 2021-10-15 NOTE — Telephone Encounter (Signed)
Pt LVM asking for a call back. There is a discrepancy between when she says she picked up her med and when the pharmacy says she can get it refilled.    This is regarding the Lorazepam and Zolpidem.  Next appt 5/22

## 2021-10-15 NOTE — Progress Notes (Signed)
Patient requesting early refill of Ambien CR 12.5 mg tablets.  Sent in prescription for number 2 tablets to get her through the weekend.

## 2021-10-15 NOTE — Telephone Encounter (Signed)
Next appt is 01/31/22. Sue [name] called and said her Zolpidem was denied by [place]. Could someone please call her and let her know why? Her phone number is [phone]?

## 2021-10-15 NOTE — Telephone Encounter (Signed)
Patient requesting early refill of Ambien CR 12.5 mg tablets.  Sent in prescription for number 2 tablets to get her through the weekend.

## 2021-10-15 NOTE — Telephone Encounter (Signed)
Pharmacy said they filled Rx on 1/4, but it wasn't picked up until 1/9. Patient was not in agreement with this. She said her daughter picked up the medication and she said it was picked up on 1/4.

## 2021-10-18 DIAGNOSIS — E1122 Type 2 diabetes mellitus with diabetic chronic kidney disease: Secondary | ICD-10-CM | POA: Diagnosis not present

## 2021-10-18 DIAGNOSIS — E785 Hyperlipidemia, unspecified: Secondary | ICD-10-CM | POA: Diagnosis not present

## 2021-10-18 DIAGNOSIS — I129 Hypertensive chronic kidney disease with stage 1 through stage 4 chronic kidney disease, or unspecified chronic kidney disease: Secondary | ICD-10-CM | POA: Diagnosis not present

## 2021-10-18 DIAGNOSIS — N183 Chronic kidney disease, stage 3 unspecified: Secondary | ICD-10-CM | POA: Diagnosis not present

## 2021-10-20 ENCOUNTER — Other Ambulatory Visit: Payer: Self-pay | Admitting: Family Medicine

## 2021-10-28 ENCOUNTER — Telehealth: Payer: Self-pay | Admitting: Family Medicine

## 2021-10-28 NOTE — Telephone Encounter (Signed)
Left message for patient to call back and schedule Medicare Annual Wellness Visit (AWV) in office.  ° °If not able to come in office, please offer to do virtually or by telephone.  Left office number and my jabber #[phone]. ° °Due for AWVI ° °Please schedule at anytime with Nurse Health Advisor. °  °

## 2021-11-17 ENCOUNTER — Other Ambulatory Visit: Payer: Self-pay | Admitting: Behavioral Health

## 2021-11-17 DIAGNOSIS — F3131 Bipolar disorder, current episode depressed, mild: Secondary | ICD-10-CM

## 2021-11-17 DIAGNOSIS — F331 Major depressive disorder, recurrent, moderate: Secondary | ICD-10-CM

## 2021-12-04 ENCOUNTER — Other Ambulatory Visit: Payer: Self-pay | Admitting: Family Medicine

## 2021-12-04 DIAGNOSIS — R600 Localized edema: Secondary | ICD-10-CM

## 2021-12-04 DIAGNOSIS — R0609 Other forms of dyspnea: Secondary | ICD-10-CM

## 2021-12-09 ENCOUNTER — Ambulatory Visit: Payer: Medicare HMO | Admitting: Family Medicine

## 2021-12-14 ENCOUNTER — Other Ambulatory Visit: Payer: Self-pay | Admitting: Behavioral Health

## 2021-12-14 ENCOUNTER — Telehealth: Payer: Self-pay | Admitting: Behavioral Health

## 2021-12-14 DIAGNOSIS — F411 Generalized anxiety disorder: Secondary | ICD-10-CM

## 2021-12-14 MED ORDER — LORAZEPAM 1 MG PO TABS: 0.5000 mg | ORAL_TABLET | ORAL | 3 refills | Status: DC

## 2021-12-14 NOTE — Telephone Encounter (Signed)
See note from patient. She last filled a 15-day supply on 3/8. I don't know if you are limiting her supply or it didn't get picked up that she was on 2 a day and should have been #60.  Please advise.  ?

## 2021-12-14 NOTE — Telephone Encounter (Signed)
Patient notified

## 2021-12-14 NOTE — Telephone Encounter (Signed)
Next visit is 01/31/22. Sue [name] called and said she takes Lorazepam 1.5 mg by mouth at bedtime and an additional 0.5 mg during the day for anxiety. If she takes it this way it will be used in 15 days with 2/day. Can she get an RX for more pills to last longer? Pharmacy is: ? ?[id number] - [place], [place] ? ?Phone:  [phone]  ?Fax:  [phone]  ? ? ? ? ? ?

## 2021-12-14 NOTE — Telephone Encounter (Signed)
Rx corrected and sent to [facility].

## 2021-12-15 ENCOUNTER — Ambulatory Visit: Payer: Medicare HMO | Admitting: Internal Medicine

## 2021-12-15 NOTE — Progress Notes (Deleted)
?Name: Sue [name]  ?MRN/ DOB: [id number], 10/17/1950   ?Age/ Sex: 71 y.o., female   ? ?PCP: Ann Held, DO   ?Reason for Endocrinology Evaluation: Type 2 Diabetes Mellitus/ Hypothyroidism  ?   ?Date of Initial Endocrinology Visit: 05/18/2021  ? ? ?PATIENT IDENTIFIER: Sue [name] is a 71 y.o. female with a past medical history of T2DM, HTN, COPD and Hypothyroidism . The patient presented for initial endocrinology clinic visit on 05/18/2021 for consultative assistance with her diabetes management.  ? ? ?HPI: ?Sue [name] is accompanied by her daughter Sue [name]  ? ? ?Diagnosed with DM years ago  ?Hemoglobin A1c has ranged from 6.7% in 2020, peaking at 10.6% in 2019. ? ? ?On her initial visit to our clinic she had an A1c of 7.5% she was using insulin vials and it did not appear that she was drawing the right dose.  I will switch her insulin to pens and started her on Trulicity.  We stopped metformin due to low GFR, we stopped glipizide due to risk of hypoglycemia ? ? ? ?THYROID HISTORY: ?She was diagnosed with hypothyroidism for years. Has been on LT-4 replacement  ?No proir radiation exposure or sx  ? ? ? ?SUBJECTIVE:  ? ?During the last visit (05/18/2021): A1c was 7.5% ? ?Today (12/15/21): Sue [name] is here for follow-up on diabetes management and hypothyroid.  She checks her blood sugars *** times daily, preprandial to breakfast and ***. The patient has *** had hypoglycemic episodes since the last clinic visit, which typically occur *** x / - most often occuring ***. The patient is *** symptomatic with these episodes, with symptoms of {symptoms; hypoglycemia:9084048}.  ? ?She met with Dr. Pearson Grippe 10/2021 -nephrology ?She follows with behavioral health for bipolar affective disorder ? ?HOME DIABETES REGIMEN: ?Toujeo 40 units daily  ?Trulicity 1.30 mg once weekly ?levothyroxine 88 mcg daily  ? ? ? ? ?Statin: no  ?ACE-I/ARB: yes ?Prior Diabetic Education: no ? ? ?METER DOWNLOAD SUMMARY: Did not bring   ? ? ? ?DIABETIC COMPLICATIONS: ?Microvascular complications:  ?CKD III, neuropathy ?Denies: retinopathy  ?Last eye exam: Completed 2022 ? ?Macrovascular complications:  ? ?Denies: CAD, PVD, CVA ? ? ?PAST HISTORY: ?Past Medical History:  ?Past Medical History:  ?Diagnosis Date  ? Allergic rhinitis   ? Depression   ? Diabetes mellitus type 2, uncontrolled   ? Emphysema of lung ([place])   ? 3L home O2  ? GERD (gastroesophageal reflux disease)   ? Hypertension   ? Hypothyroidism   ? Obesity, morbid, BMI 50 or higher ([place])   ? Stroke [facility]) 2016  ? TIA   ? Urine incontinence   ? ?Past Surgical History:  ?Past Surgical History:  ?Procedure Laterality Date  ? ABDOMINAL HYSTERECTOMY    ? CESAREAN SECTION    ? COLONOSCOPY N/A 08/19/2019  ? Procedure: COLONOSCOPY;  Surgeon: Clarene Essex, MD;  Location: [facility];  Service: Endoscopy;  Laterality: N/A;  ? COLONOSCOPY WITH PROPOFOL N/A 08/05/2019  ? Procedure: COLONOSCOPY WITH PROPOFOL;  Surgeon: Wilford Corner, MD;  Location: [facility];  Service: Gastroenterology;  Laterality: N/A;  ? COLONOSCOPY WITH PROPOFOL N/A 11/19/2019  ? Procedure: COLONOSCOPY WITH PROPOFOL;  Surgeon: Ronald Lobo, MD;  Location: [facility];  Service: Endoscopy;  Laterality: N/A;  Unprepped  ? COLONOSCOPY WITH PROPOFOL N/A 11/22/2019  ? Procedure: COLONOSCOPY WITH PROPOFOL;  Surgeon: Ronald Lobo, MD;  Location: [facility];  Service: Endoscopy;  Laterality: N/A;  ? COLONOSCOPY WITH PROPOFOL N/A 11/23/2019  ?  Procedure: COLONOSCOPY WITH PROPOFOL;  Surgeon: [name], MD;  Location: [facility];  Service: Gastroenterology;  Laterality: N/A;  ? COLONOSCOPY WITH PROPOFOL N/A 11/29/2019  ? Procedure: COLONOSCOPY WITH PROPOFOL;  Surgeon: Wilford Corner, MD;  [facility]: [facility];  Service: Endoscopy;  Laterality: N/A;  ? ENTEROSCOPY N/A 11/24/2019  ? Procedure: ENTEROSCOPY;  Surgeon: [name], MD;  Location: [facility];  Service: Gastroenterology;  Laterality: N/A;  ? ENTEROSCOPY N/A  11/27/2019  ? Procedure: ENTEROSCOPY;  Surgeon: Wilford Corner, MD;  [facility]: [facility];  Service: Endoscopy;  Laterality: N/A;  ? ESOPHAGOGASTRODUODENOSCOPY N/A 11/27/2019  ? Procedure: ESOPHAGOGASTRODUODENOSCOPY (EGD);  Surgeon: Wilford Corner, MD;  Location: [facility];  Service: Endoscopy;  Laterality: N/A;  ? ESOPHAGOGASTRODUODENOSCOPY (EGD) WITH PROPOFOL N/A 11/24/2019  ? Procedure: ESOPHAGOGASTRODUODENOSCOPY (EGD) WITH PROPOFOL;  Surgeon: [name], MD;  [facility];  Service: Gastroenterology;  Laterality: N/A;  PUSH enteroscopy  ? GIVENS CAPSULE STUDY N/A 11/19/2019  ? Procedure: GIVENS CAPSULE STUDY;  Surgeon: Ronald Lobo, MD;  [facility]: [facility];  Service: Endoscopy;  Laterality: N/A;  To be performed immediately following colonoscopy  ? GIVENS CAPSULE STUDY N/A 11/24/2019  ? Procedure: GIVENS CAPSULE STUDY;  Surgeon: [name], MD;  [facility];  Service: Gastroenterology;  Laterality: N/A;  ? GIVENS CAPSULE STUDY N/A 11/28/2019  ? Procedure: GIVENS CAPSULE STUDY;  Surgeon: Wilford Corner, MD;  [facility];  Service: Endoscopy;  Laterality: N/A;  ? HEMOSTASIS CLIP PLACEMENT  11/19/2019  ? Procedure: HEMOSTASIS CLIP PLACEMENT;  Surgeon: Ronald Lobo, MD;  [facility]: [facility];  Service: Endoscopy;;  ? HEMOSTASIS CLIP PLACEMENT  11/22/2019  ? Procedure: HEMOSTASIS CLIP PLACEMENT;  Surgeon: Ronald Lobo, MD;  [facility]: [facility];  Service: Endoscopy;;  ? HOT HEMOSTASIS N/A 11/24/2019  ? Procedure: HOT HEMOSTASIS (ARGON PLASMA COAGULATION/BICAP);  Surgeon: [name], MD;  Location: [facility];  Service: Gastroenterology;  Laterality: N/A;  ? HOT HEMOSTASIS N/A 11/27/2019  ? Procedure: HOT HEMOSTASIS (ARGON PLASMA COAGULATION/BICAP);  Surgeon: Wilford Corner, MD;  Location: [facility];  Service: Endoscopy;  Laterality: N/A;  ? SUBMUCOSAL TATTOO INJECTION  11/19/2019  ? Procedure: SUBMUCOSAL TATTOO INJECTION;  Surgeon: Ronald Lobo, MD;   [facility]: [facility];  Service: Endoscopy;;  ?  ?Social History:  reports that she quit smoking about 10 years ago. Her smoking use included cigarettes. She started smoking about 49 years ago. She has a 40.00 pack-year smoking history. She has never used smokeless tobacco. She reports that she does not currently use alcohol. She reports that she does not use drugs. ?Family History:  ?Family History  ?Problem Relation Age of Onset  ? Heart disease Father   ?     MVP and Pics Valve  ? Hypertension Father   ? Depression Father   ?     Institutionalized x's 2 years  ? Bipolar disorder Father   ? Hypertension Sister   ? Diabetes Sister   ? Hyperlipidemia Sister   ? Heart disease Sister 80  ?     MI  ? Heart disease Brother   ? Hypertension Brother   ? Schizophrenia Paternal Aunt   ? Depression Paternal Aunt   ? Anxiety disorder Paternal Aunt   ? Heart disease Paternal Aunt   ? Schizophrenia Paternal Aunt   ? Heart disease Paternal Uncle   ? Heart disease Paternal Grandmother   ? Asthma Son   ? Asthma Son   ? ? ? ?HOME MEDICATIONS: ?Allergies as of 12/15/2021   ? ?  Reactions  ? Hydrocodone Other (See Comments)  ? Constipation and hallucinations  ? Norvasc [amlodipine Besylate] Swelling, Other (See Comments)  ? Marked swelling of the limbs  ? Tizanidine Other (See Comments)  ? After the 3rd dose, the patient's mouth began to feel numb and she felt like she was a having a "hot flash"  ? ?  ? ?  ?Medication List  ?  ? ?  ? Accurate as of December 15, 2021  7:34 AM. If you have any questions, ask your nurse or doctor.  ?  ?  ? ?  ? ?Accu-Chek FastClix Lancets Misc ?USE TO CHECK BLOOD SUGAR UP TO FOUR TIMES DAILY AS DIRECTED ?What changed: See the new instructions. ?  ?Accu-Chek Guide test strip ?Generic drug: glucose blood ?USE 1 STRIP TO CHECK GLUCOSE UP TO 4 TIMES DAILY ?  ?albuterol 108 (90 Base) MCG/ACT inhaler ?Commonly known as: VENTOLIN HFA ?Inhale 1-2 puffs into the lungs every 6 (six) hours as needed for wheezing or  shortness of breath. ?  ?albuterol (2.5 MG/3ML) 0.083% nebulizer solution ?Commonly known as: PROVENTIL ?Take 3 mLs (2.5 mg total) by nebulization every 6 (six) hours as needed for wheezing or shortness of breat

## 2022-01-07 ENCOUNTER — Other Ambulatory Visit: Payer: Self-pay | Admitting: Family Medicine

## 2022-01-07 DIAGNOSIS — E1165 Type 2 diabetes mellitus with hyperglycemia: Secondary | ICD-10-CM

## 2022-01-07 DIAGNOSIS — R0609 Other forms of dyspnea: Secondary | ICD-10-CM

## 2022-01-07 DIAGNOSIS — R600 Localized edema: Secondary | ICD-10-CM

## 2022-01-11 ENCOUNTER — Other Ambulatory Visit: Payer: Self-pay | Admitting: Behavioral Health

## 2022-01-11 DIAGNOSIS — F3131 Bipolar disorder, current episode depressed, mild: Secondary | ICD-10-CM

## 2022-01-11 DIAGNOSIS — F331 Major depressive disorder, recurrent, moderate: Secondary | ICD-10-CM

## 2022-01-12 ENCOUNTER — Other Ambulatory Visit: Payer: Self-pay | Admitting: Family Medicine

## 2022-01-12 DIAGNOSIS — E1165 Type 2 diabetes mellitus with hyperglycemia: Secondary | ICD-10-CM

## 2022-01-13 ENCOUNTER — Other Ambulatory Visit: Payer: Self-pay | Admitting: Family Medicine

## 2022-01-13 DIAGNOSIS — E1165 Type 2 diabetes mellitus with hyperglycemia: Secondary | ICD-10-CM

## 2022-01-24 ENCOUNTER — Other Ambulatory Visit: Payer: Self-pay | Admitting: Family Medicine

## 2022-01-24 ENCOUNTER — Ambulatory Visit (INDEPENDENT_AMBULATORY_CARE_PROVIDER_SITE_OTHER): Payer: Medicare HMO

## 2022-01-24 ENCOUNTER — Telehealth: Payer: Self-pay

## 2022-01-24 VITALS — Ht 63.0 in | Wt 290.0 lb

## 2022-01-24 DIAGNOSIS — Z0000 Encounter for general adult medical examination without abnormal findings: Secondary | ICD-10-CM

## 2022-01-24 DIAGNOSIS — Z122 Encounter for screening for malignant neoplasm of respiratory organs: Secondary | ICD-10-CM

## 2022-01-24 DIAGNOSIS — E876 Hypokalemia: Secondary | ICD-10-CM

## 2022-01-24 MED ORDER — POTASSIUM CHLORIDE CRYS ER 20 MEQ PO TBCR: 20.0000 meq | EXTENDED_RELEASE_TABLET | Freq: Every day | ORAL | 0 refills | Status: DC

## 2022-01-24 MED ORDER — METOPROLOL SUCCINATE ER 50 MG PO TB24
ORAL_TABLET | ORAL | 1 refills | Status: DC
Start: 2022-01-24 — End: 2022-08-30

## 2022-01-24 MED ORDER — ACCU-CHEK GUIDE VI STRP: ORAL_STRIP | 0 refills | Status: DC

## 2022-01-24 NOTE — Patient Instructions (Signed)
Sue [name] , ?Thank you for taking time to complete your Medicare Wellness Visit. I appreciate your ongoing commitment to your health goals. Please review the following plan we discussed and let me know if I can assist you in the future.  ? ?Screening recommendations/referrals: ?Colonoscopy: Completed 11/29/2019-Due 11/28/2024 ?Mammogram: Due-Per our conversation, you will call to schedule. ?Bone Density: Due-Per our conversation, you will call to schedule. ?Lung Cancer screening: Ordered today. Someone will call you to schedule. ?Recommended yearly ophthalmology/optometry visit for glaucoma screening and checkup ?Recommended yearly dental visit for hygiene and checkup ? ?Vaccinations: ?Influenza vaccine: Up to date ?Pneumococcal vaccine: Up to date ?Tdap vaccine: Due-May obtain vaccine at your local pharmacy. ?Shingles vaccine: Due-May obtain vaccine at your local pharmacy.   ?Covid-19:-May obtain booster at your local pharmacy. ? ?Advanced directives: Please bring a copy of Living Will and/or Healthcare Power of Attorney for your chart. ? ? ?Conditions/risks identified: See problem list ? ?Next appointment: Follow up in one year for your annual wellness visit  ? ? ?Preventive Care 70 Years and Older, Female ?Preventive care refers to lifestyle choices and visits with your health care provider that can promote health and wellness. ?What does preventive care include? ?A yearly physical exam. This is also called an annual well check. ?Dental exams once or twice a year. ?Routine eye exams. Ask your health care provider how often you should have your eyes checked. ?Personal lifestyle choices, including: ?Daily care of your teeth and gums. ?Regular physical activity. ?Eating a healthy diet. ?Avoiding tobacco and drug use. ?Limiting alcohol use. ?Practicing safe sex. ?Taking low-dose aspirin every day. ?Taking vitamin and mineral supplements as recommended by your health care provider. ?What happens during an annual well  check? ?The services and screenings done by your health care provider during your annual well check will depend on your age, overall health, lifestyle risk factors, and family history of disease. ?Counseling  ?Your health care provider may ask you questions about your: ?Alcohol use. ?Tobacco use. ?Drug use. ?Emotional well-being. ?Home and relationship well-being. ?Sexual activity. ?Eating habits. ?History of falls. ?Memory and ability to understand (cognition). ?Work and work Statistician. ?Reproductive health. ?Screening  ?You may have the following tests or measurements: ?Height, weight, and BMI. ?Blood pressure. ?Lipid and cholesterol levels. These may be checked every 5 years, or more frequently if you are over 48 years old. ?Skin check. ?Lung cancer screening. You may have this screening every year starting at age 28 if you have a 30-pack-year history of smoking and currently smoke or have quit within the past 15 years. ?Fecal occult blood test (FOBT) of the stool. You may have this test every year starting at age 61. ?Flexible sigmoidoscopy or colonoscopy. You may have a sigmoidoscopy every 5 years or a colonoscopy every 10 years starting at age 19. ?Hepatitis C blood test. ?Hepatitis B blood test. ?Sexually transmitted disease (STD) testing. ?Diabetes screening. This is done by checking your blood sugar (glucose) after you have not eaten for a while (fasting). You may have this done every 1-3 years. ?Bone density scan. This is done to screen for osteoporosis. You may have this done starting at age 87. ?Mammogram. This may be done every 1-2 years. Talk to your health care provider about how often you should have regular mammograms. ?Talk with your health care provider about your test results, treatment options, and if necessary, the need for more tests. ?Vaccines  ?Your health care provider may recommend certain vaccines, such as: ?Influenza vaccine. This  is recommended every year. ?Tetanus, diphtheria, and  acellular pertussis (Tdap, Td) vaccine. You may need a Td booster every 10 years. ?Zoster vaccine. You may need this after age 24. ?Pneumococcal 13-valent conjugate (PCV13) vaccine. One dose is recommended after age 71. ?Pneumococcal polysaccharide (PPSV23) vaccine. One dose is recommended after age 2. ?Talk to your health care provider about which screenings and vaccines you need and how often you need them. ?This information is not intended to replace advice given to you by your health care provider. Make sure you discuss any questions you have with your health care provider. ?Document Released: 09/25/2015 Document Revised: 05/18/2016 Document Reviewed: 06/30/2015 ?Elsevier Interactive Patient Education ? 2017 [place]. ? ?Fall Prevention in the Home ?Falls can cause injuries. They can happen to people of all ages. There are many things you can do to make your home safe and to help prevent falls. ?What can I do on the outside of my home? ?Regularly fix the edges of walkways and driveways and fix any cracks. ?Remove anything that might make you trip as you walk through a door, such as a raised step or threshold. ?Trim any bushes or trees on the path to your home. ?Use bright outdoor lighting. ?Clear any walking paths of anything that might make someone trip, such as rocks or tools. ?Regularly check to see if handrails are loose or broken. Make sure that both sides of any steps have handrails. ?Any raised decks and porches should have guardrails on the edges. ?Have any leaves, snow, or ice cleared regularly. ?Use sand or salt on walking paths during winter. ?Clean up any spills in your garage right away. This includes oil or grease spills. ?What can I do in the bathroom? ?Use night lights. ?Install grab bars by the toilet and in the tub and shower. Do not use towel bars as grab bars. ?Use non-skid mats or decals in the tub or shower. ?If you need to sit down in the shower, use a plastic, non-slip stool. ?Keep  the floor dry. Clean up any water that spills on the floor as soon as it happens. ?Remove soap buildup in the tub or shower regularly. ?Attach bath mats securely with double-sided non-slip rug tape. ?Do not have throw rugs and other things on the floor that can make you trip. ?What can I do in the bedroom? ?Use night lights. ?Make sure that you have a light by your bed that is easy to reach. ?Do not use any sheets or blankets that are too big for your bed. They should not hang down onto the floor. ?Have a firm chair that has side arms. You can use this for support while you get dressed. ?Do not have throw rugs and other things on the floor that can make you trip. ?What can I do in the kitchen? ?Clean up any spills right away. ?Avoid walking on wet floors. ?Keep items that you use a lot in easy-to-reach places. ?If you need to reach something above you, use a strong step stool that has a grab bar. ?Keep electrical cords out of the way. ?Do not use floor polish or wax that makes floors slippery. If you must use wax, use non-skid floor wax. ?Do not have throw rugs and other things on the floor that can make you trip. ?What can I do with my stairs? ?Do not leave any items on the stairs. ?Make sure that there are handrails on both sides of the stairs and use them. Fix handrails that  are broken or loose. Make sure that handrails are as long as the stairways. ?Check any carpeting to make sure that it is firmly attached to the stairs. Fix any carpet that is loose or worn. ?Avoid having throw rugs at the top or bottom of the stairs. If you do have throw rugs, attach them to the floor with carpet tape. ?Make sure that you have a light switch at the top of the stairs and the bottom of the stairs. If you do not have them, ask someone to add them for you. ?What else can I do to help prevent falls? ?Wear shoes that: ?Do not have high heels. ?Have rubber bottoms. ?Are comfortable and fit you well. ?Are closed at the toe. Do not  wear sandals. ?If you use a stepladder: ?Make sure that it is fully opened. Do not climb a closed stepladder. ?Make sure that both sides of the stepladder are locked into place. ?Ask someone to hold it for

## 2022-01-24 NOTE — Telephone Encounter (Signed)
Patient is requesting refills of Metoprolol, Potassium & glucose test strips. ?

## 2022-01-24 NOTE — Progress Notes (Signed)
Subjective:   Sue [name] is a 71 y.o. female who presents for an Initial Medicare Annual Wellness Visit.  I connected with Jensyn today by telephone and verified that I am speaking with the correct person using two identifiers. Location patient: home Location provider: work Persons participating in the virtual visit: patient, Marine scientist.    I discussed the limitations, risks, security and privacy concerns of performing an evaluation and management service by telephone and the availability of in person appointments. I also discussed with the patient that there may be a patient responsible charge related to this service. The patient expressed understanding and verbally consented to this telephonic visit.    Interactive audio and video telecommunications were attempted between this provider and patient, however failed, due to patient having technical difficulties OR patient did not have access to video capability.  We continued and completed visit with audio only.  Some vital signs may be absent or patient reported.   Time Spent with patient on telephone encounter: 30 minutes   Review of Systems     Cardiac Risk Factors include: advanced age (>19mn, >>62women);diabetes mellitus;dyslipidemia;hypertension;obesity (BMI >30kg/m2)     Objective:    Today's Vitals   01/24/22 1301  Weight: 290 lb (131.5 kg)  Height: '5\' 3"'  (1.6 m)  PainSc: 9    Body mass index is 51.37 kg/m.     01/24/2022    1:09 PM 11/13/2019    2:04 PM 08/16/2019    9:32 AM 08/02/2019    5:02 PM 04/02/2019   12:39 PM 03/09/2019    2:45 AM 02/24/2019    8:57 PM  Advanced Directives  Does Patient Have a Medical Advance Directive? Yes No No No No No No  Type of Advance Directive [place] Chart? No - copy requested        Would patient like information on creating a medical advance directive?  No - Patient declined No - Patient declined No - Patient  declined Yes (ED - Information included in AVS) No - Patient declined No - Patient declined    Current Medications (verified) Outpatient Encounter Medications as of 01/24/2022  Medication Sig   Accu-Chek FastClix Lancets MISC USE TO CHECK BLOOD SUGAR UP TO FOUR TIMES DAILY AS DIRECTED (Patient taking differently: See admin instructions. Up to 4 times a day)   ACCU-CHEK GUIDE test strip USE 1 STRIP TO CHECK GLUCOSE UP TO 4 TIMES DAILY   albuterol (PROVENTIL HFA;VENTOLIN HFA) 108 (90 Base) MCG/ACT inhaler Inhale 1-2 puffs into the lungs every 6 (six) hours as needed for wheezing or shortness of breath.   albuterol (PROVENTIL) (2.5 MG/3ML) 0.083% nebulizer solution Take 3 mLs (2.5 mg total) by nebulization every 6 (six) hours as needed for wheezing or shortness of breath.   allopurinol (ZYLOPRIM) 100 MG tablet    blood glucose meter kit and supplies KIT Dispense based on patient and insurance preference. Use up to four times daily as directed. (FOR ICD-9 250.00, 250.01).   busPIRone (BUSPAR) 15 MG tablet Take 1 tablet (15 mg total) by mouth 2 (two) times daily.   Dulaglutide (TRULICITY) 03.08MMV/7.8IOSOPN Inject 0.75 mg into the skin once a week.   famotidine (PEPCID) 20 MG tablet Take 20 mg by mouth daily as needed for heartburn or indigestion.   insulin glargine, 1 Unit Dial, (TOUJEO SOLOSTAR) 300 UNIT/ML Solostar Pen Inject 40 Units into the skin  daily in the afternoon.   Insulin Pen Needle 29G X 8MM MISC 1 Device by Does not apply route daily in the afternoon.   levothyroxine (SYNTHROID) 88 MCG tablet Take 1 tablet (88 mcg total) by mouth daily.   LORazepam (ATIVAN) 1 MG tablet Take 0.5-1.5 tablets (0.5-1.5 mg total) by mouth See admin instructions. Take 1-1.5 mg by mouth at bedtime and an additional 0.5 mg once a day as needed for anxiety   Magnesium 400 MG CAPS Take 1 capsule by mouth daily.   metoprolol succinate (TOPROL-XL) 50 MG 24 hr tablet TAKE 2 TABLETS BY MOUTH ONCE DAILY. TAKE  IMMEDIATELY FOLLOWING A MEAL   NONFORMULARY OR COMPOUNDED ITEM Pt/ inr   Dx dvt   Tomorrow 04/05/2019  Please call office [phone] with results   NONFORMULARY OR COMPOUNDED ITEM PT/ inr    Dx dvt   NONFORMULARY OR COMPOUNDED ITEM Compression stockings  20-30 mm/hg  #1   Dx low ext edema   olmesartan (BENICAR) 20 MG tablet Take 1 tablet (20 mg total) by mouth daily.   OXYGEN Inhale 3-4 L/min into the lungs continuous.    potassium chloride SA (KLOR-CON M) 20 MEQ tablet Take 1 tablet by mouth once daily   QUEtiapine (SEROQUEL) 100 MG tablet TAKE 1 AND 1/2 TABLETS BY MOUTH AT BEDTIME   torsemide (DEMADEX) 20 MG tablet Take 3 tablets by mouth once daily   zolpidem (AMBIEN CR) 12.5 MG CR tablet TAKE 1 TABLET BY MOUTH AT BEDTIME AS NEEDED FOR SLEEP.   zolpidem (AMBIEN CR) 12.5 MG CR tablet Take 1 tablet (12.5 mg total) by mouth at bedtime as needed for sleep.   zolpidem (AMBIEN) 10 MG tablet Take 10 mg by mouth at bedtime.   hydrALAZINE (APRESOLINE) 25 MG tablet Take 0.5 tablets (12.5 mg total) by mouth 3 (three) times daily.   traMADol (ULTRAM) 50 MG tablet Take 1 tablet (50 mg total) by mouth every 6 (six) hours as needed. (Patient not taking: Reported on 08/23/2021)   No facility-administered encounter medications on file as of 01/24/2022.    Allergies (verified) Hydrocodone, Norvasc [amlodipine besylate], and Tizanidine   History: Past Medical History:  Diagnosis Date   Allergic rhinitis    Depression    Diabetes mellitus type 2, uncontrolled    Emphysema of lung ([place])    3L home O2   GERD (gastroesophageal reflux disease)    Hypertension    Hypothyroidism    Obesity, morbid, BMI 50 or higher ([place])    Stroke ([place]) 2016   TIA    Urine incontinence    Past Surgical History:  Procedure Laterality Date   ABDOMINAL HYSTERECTOMY     CESAREAN SECTION     COLONOSCOPY N/A 08/19/2019   Procedure: COLONOSCOPY;  Surgeon: Clarene Essex, MD;  [facility]: [facility];  Service: Endoscopy;   Laterality: N/A;   COLONOSCOPY WITH PROPOFOL N/A 08/05/2019   Procedure: COLONOSCOPY WITH PROPOFOL;  Surgeon: Wilford Corner, MD;  [facility];  Service: Gastroenterology;  Laterality: N/A;   COLONOSCOPY WITH PROPOFOL N/A 11/19/2019   Procedure: COLONOSCOPY WITH PROPOFOL;  Surgeon: Ronald Lobo, MD;  [facility];  Service: Endoscopy;  Laterality: N/A;  Unprepped   COLONOSCOPY WITH PROPOFOL N/A 11/22/2019   Procedure: COLONOSCOPY WITH PROPOFOL;  Surgeon: Ronald Lobo, MD;  [facility];  Service: Endoscopy;  Laterality: N/A;   COLONOSCOPY WITH PROPOFOL N/A 11/23/2019   Procedure: COLONOSCOPY WITH PROPOFOL;  Surgeon: [name], MD;  [facility];  Service: Gastroenterology;  Laterality: N/A;   COLONOSCOPY WITH PROPOFOL N/A 11/29/2019   Procedure: COLONOSCOPY WITH PROPOFOL;  Surgeon: Wilford Corner, MD;  [facility];  Service: Endoscopy;  Laterality: N/A;   ENTEROSCOPY N/A 11/24/2019   Procedure: ENTEROSCOPY;  Surgeon: [name], MD;  [facility];  Service: Gastroenterology;  Laterality: N/A;   ENTEROSCOPY N/A 11/27/2019   Procedure: ENTEROSCOPY;  Surgeon: Wilford Corner, MD;  [facility];  Service: Endoscopy;  Laterality: N/A;   ESOPHAGOGASTRODUODENOSCOPY N/A 11/27/2019   Procedure: ESOPHAGOGASTRODUODENOSCOPY (EGD);  Surgeon: Wilford Corner, MD;  Location: [facility];  Service: Endoscopy;  Laterality: N/A;   ESOPHAGOGASTRODUODENOSCOPY (EGD) WITH PROPOFOL N/A 11/24/2019   Procedure: ESOPHAGOGASTRODUODENOSCOPY (EGD) WITH PROPOFOL;  Surgeon: [name], MD;  [facility];  Service: Gastroenterology;  Laterality: N/A;  PUSH enteroscopy   GIVENS CAPSULE STUDY N/A 11/19/2019   Procedure: GIVENS CAPSULE STUDY;  Surgeon: Ronald Lobo, MD;  [facility];  Service: Endoscopy;  Laterality: N/A;  To be performed immediately following colonoscopy   GIVENS CAPSULE STUDY N/A 11/24/2019   Procedure:  GIVENS CAPSULE STUDY;  Surgeon: [name], MD;  [facility];  Service: Gastroenterology;  Laterality: N/A;   GIVENS CAPSULE STUDY N/A 11/28/2019   Procedure: GIVENS CAPSULE STUDY;  Surgeon: Wilford Corner, MD;  [facility];  Service: Endoscopy;  Laterality: N/A;   HEMOSTASIS CLIP PLACEMENT  11/19/2019   Procedure: HEMOSTASIS CLIP PLACEMENT;  Surgeon: Ronald Lobo, MD;  [facility];  Service: Endoscopy;;   HEMOSTASIS CLIP PLACEMENT  11/22/2019   Procedure: HEMOSTASIS CLIP PLACEMENT;  Surgeon: Ronald Lobo, MD;  [facility];  Service: Endoscopy;;   HOT HEMOSTASIS N/A 11/24/2019   Procedure: HOT HEMOSTASIS (ARGON PLASMA COAGULATION/BICAP);  Surgeon: [name], MD;  Location: [facility];  Service: Gastroenterology;  Laterality: N/A;   HOT HEMOSTASIS N/A 11/27/2019   Procedure: HOT HEMOSTASIS (ARGON PLASMA COAGULATION/BICAP);  Surgeon: Wilford Corner, MD;  Location: [facility];  Service: Endoscopy;  Laterality: N/A;   SUBMUCOSAL TATTOO INJECTION  11/19/2019   Procedure: SUBMUCOSAL TATTOO INJECTION;  Surgeon: Ronald Lobo, MD;  [facility];  Service: Endoscopy;;   Family History  Problem Relation Age of Onset   Heart disease Father        MVP and Pics Valve   Hypertension Father    Depression Father        Institutionalized x's 2 years   Bipolar disorder Father    Hypertension Sister    Diabetes Sister    Hyperlipidemia Sister    Heart disease Sister 72       MI   Heart disease Brother    Hypertension Brother    Schizophrenia Paternal Aunt    Depression Paternal Aunt    Anxiety disorder Paternal Aunt    Heart disease Paternal Aunt    Schizophrenia Paternal Aunt    Heart disease Paternal Uncle    Heart disease Paternal Grandmother    Asthma Son    Asthma Son    Social History   Socioeconomic History   Marital status: Divorced    Spouse name: Not on file   Number of children: 4   Years of education: 30    Highest education level: Associate degree: academic program  Occupational History   Occupation: disabled- Occupational hygienist Med  Tobacco Use   Smoking status: Former    Packs/day: 1.00    Years: 40.00    Pack years: 40.00    Types: Cigarettes    Start date: 07/21/1972    Quit date:  10/16/2011    Years since quitting: 10.2   Smokeless tobacco: Never  Vaping Use   Vaping Use: Never used  Substance and Sexual Activity   Alcohol use: Not Currently    Comment: Occ-- Wine   Drug use: No   Sexual activity: Not Currently  Other Topics Concern   Not on file  Social History Narrative      Originally from [place]. Has also lived in [place], [place]. She also previously lived in [place] for 20 years. No history of [place] Fever. Moved to [place] in 1989. 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 No pets currently. No bird exposure. Questionable previous mold exposure in her daughter's home. 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       Lives in [place] alone in assisted living.  Has multiple chronic illness.   Social Determinants of Health   Financial Resource Strain: Low Risk    Difficulty of Paying Living Expenses: Not hard at all  Food Insecurity: No Food Insecurity   Worried About Charity fundraiser in the Last Year: Never true   [place] in the Last Year: Never true  Transportation Needs: No Transportation Needs   Lack of Transportation (Medical): No   Lack of Transportation (Non-Medical): No  Physical Activity: Inactive   Days of Exercise per Week: 0 days   Minutes of Exercise per Session: 0 min  Stress: No Stress Concern Present   Feeling of Stress : Not at all  Social Connections: Moderately Integrated   Frequency of Communication with Friends and Family:  More than three times a week   Frequency of Social Gatherings with Friends and Family: Three times a week   Attends Religious Services: 1 to 4 times per year   Active Member of Clubs or Organizations: Yes   Attends Music therapist: More than 4 times per year   Marital Status: Divorced    Tobacco Counseling Counseling given: Not Answered   Clinical Intake:  Pre-visit preparation completed: Yes  Pain : 0-10 Pain Score: 9  Pain Type: Chronic pain Pain Location: Knee Pain Orientation: Left Pain Onset: More than a month ago Pain Frequency: Constant     BMI - recorded: 51.37 Nutritional Status: BMI > 30  Obese Nutritional Risks: None Diabetes: Yes CBG done?: No Did pt. bring in CBG monitor from home?: No (phone visit)  How often do you need to have someone help you when you read instructions, pamphlets, or other written materials from your doctor or pharmacy?: 1 - Never Diabetes:  Is the patient diabetic?  Yes  If diabetic, was a CBG obtained today?  No  Did the patient bring in their glucometer from home?  No phone visit How often do you monitor your CBG's? daily.   Financial Strains and Diabetes Management:  Are you having any financial strains with the device, your supplies or your medication? No .  Does the patient want to be seen by Chronic Care Management for management of their diabetes?  No  Would the patient like to be referred to a Nutritionist or for Diabetic Management?  No   Diabetic Exams:  Diabetic Eye Exam: . Overdue for diabetic eye exam. Pt has been advised about the importance in completing  this exam.   Diabetic Foot Exam: Completed 05/18/2021.   Interpreter Needed?: No  Information entered by :: [name] LPN   Activities of Daily Living    01/24/2022    1:14 PM 02/01/2021    9:22 AM  In your present state of health, do you have any difficulty performing the following activities:  Hearing? 0 1  Vision? 0 0  Difficulty  concentrating or making decisions? 1 0  Comment occasionally   Walking or climbing stairs? 0 1  Dressing or bathing? 0 0  Doing errands, shopping? 1 1  Preparing Food and eating ? N   Using the Toilet? N   In the past six months, have you accidently leaked urine? N   Do you have problems with loss of bowel control? N   Managing your Medications? N   Managing your Finances? N   Housekeeping or managing your Housekeeping? Y     Patient Care Team: Carollee Herter, Alferd Apa, DO as PCP - General (Family Medicine) Berniece Salines, DO as PCP - Cardiology (Cardiology) Lelon Perla, MD as Referring Physician (Internal Medicine) Lelon Perla, MD as Referring Physician (Internal Medicine) Carollee Herter, Alferd Apa, DO (Family Medicine) Jerline Pain, MD as Consulting Physician (Cardiology)  Indicate any recent Medical Services you may have received from other than [facility] providers in the past year (date may be approximate).     Assessment:   This is a routine wellness examination for Benny.  Hearing/Vision screen Hearing Screening - Comments:: No issues Vision Screening - Comments:: Last eye exam-2022-[facility]  Dietary issues and exercise activities discussed: Current Exercise Habits: The patient does not participate in regular exercise at present, Exercise limited by: None identified   Goals Addressed             This Visit's Progress    Patient Stated       Drinking more water &eat more fruits & vegetables       Depression Screen    01/24/2022    1:14 PM 02/01/2021    9:22 AM 08/01/2016   10:40 AM 11/23/2015    9:49 AM 11/20/2015   11:02 AM 11/19/2015    2:11 PM  PHQ 2/9 Scores  PHQ - 2 Score 0 0 0 0 1 1    Fall Risk    01/24/2022    1:11 PM 07/16/2021   11:14 AM 05/28/2020   11:04 AM 08/01/2016   10:40 AM 12/30/2015    1:11 PM  Fall Risk   Falls in the past year? 0 1 1 No Yes  Number falls in past yr: 0 0 0  2 or more  Injury with Fall? 0 0 0  No   Risk Factor Category      High Fall Risk  Risk for fall due to :  Impaired balance/gait;Impaired mobility History of fall(s);Impaired balance/gait  History of fall(s);Impaired balance/gait  Follow up Falls prevention discussed Falls evaluation completed Falls evaluation completed  Falls prevention discussed    FALL RISK PREVENTION PERTAINING TO THE HOME:  Any stairs in or around the home? No  Home free of loose throw rugs in walkways, pet beds, electrical cords, etc? No oxygen tubing Adequate lighting in your home to reduce risk of falls? Yes   ASSISTIVE DEVICES UTILIZED TO PREVENT FALLS:  Life alert? No  Use of a cane, walker or w/c? Yes walker Grab bars in the bathroom? Yes  Shower chair or bench in shower? Yes  Elevated toilet seat or a handicapped toilet? No   TIMED UP AND GO:  Was the test performed? No . Phone visit   Cognitive Function:Normal cognitive status assessed by this Nurse Health Advisor. No abnormalities found.          Immunizations Immunization History  Administered Date(s) Administered   Fluad Quad(high Dose 65+) 05/28/2019, 05/28/2020, 07/16/2021   Influenza, High Dose Seasonal PF 08/18/2018   Influenza,inj,Quad PF,6+ Mos 08/21/2014, 10/27/2015, 08/23/2016, 05/11/2017   Influenza-Unspecified 09/19/2011, 06/12/2013   PFIZER(Purple Top)SARS-COV-2 Vaccination 01/11/2020, 03/17/2020   Pneumococcal Conjugate-13 04/06/2017   Pneumococcal Polysaccharide-23 02/17/2012, 09/12/2012, 05/28/2020   Tdap 09/12/2009, 02/17/2012    TDAP status: Up to date  Flu Vaccine status: Up to date  Pneumococcal vaccine status: Up to date  Covid-19 vaccine status: Information provided on how to obtain vaccines.   Qualifies for Shingles Vaccine? Yes   Zostavax completed No   Shingrix Completed?: No.    Education has been provided regarding the importance of this vaccine. Patient has been advised to call insurance company to determine out of pocket expense if they have  not yet received this vaccine. Advised may also receive vaccine at local pharmacy or Health Dept. Verbalized acceptance and understanding.  Screening Tests Health Maintenance  Topic Date Due   OPHTHALMOLOGY EXAM  Never done   Zoster Vaccines- Shingrix (1 of 2) Never done   MAMMOGRAM  01/01/2015   DEXA SCAN  Never done   COVID-19 Vaccine (3 - Booster for Pfizer series) 05/12/2020   HEMOGLOBIN A1C  10/06/2021   TETANUS/TDAP  02/16/2022   INFLUENZA VACCINE  04/12/2022   FOOT EXAM  05/18/2022   COLONOSCOPY (Pts 45-83yr Insurance coverage will need to be confirmed)  11/28/2029   Pneumonia Vaccine 70 Years old  Completed   Hepatitis C Screening  Completed   HPV VACCINES  Aged Out    Health Maintenance  Health Maintenance Due  Topic Date Due   OPHTHALMOLOGY EXAM  Never done   Zoster Vaccines- Shingrix (1 of 2) Never done   MAMMOGRAM  01/01/2015   DEXA SCAN  Never done   COVID-19 Vaccine (3 - Booster for Pfizer series) 05/12/2020   HEMOGLOBIN A1C  10/06/2021    Colorectal cancer screening: Type of screening: Colonoscopy. Completed 11/29/2019. Repeat every 5 years  Mammogram status: Ordered previously. Pt provided with contact info and advised to call to schedule appt.   Bone Density status: Ordered previously. Pt provided with contact info and advised to call to schedule appt.  Lung Cancer Screening: (Low Dose CT Chest recommended if Age 70-80years, 30 pack-year currently smoking OR have quit w/in 15years.) does qualify.   Lung Cancer Screening Referral: ordered today  Additional Screening:  Hepatitis C Screening: Completed 02/01/2021  Vision Screening: Recommended annual ophthalmology exams for early detection of glaucoma and other disorders of the eye. Is the patient up to date with their annual eye exam?  No  Who is the provider or what is the name of the office in which the patient attends annual eye exams? [facility]  Dental Screening: Recommended annual dental  exams for proper oral hygiene  Community Resource Referral / Chronic Care Management: CRR required this visit?  No   CCM required this visit?  No      Plan:     I have personally reviewed and noted the following in the patient's chart:   Medical and social history Use of alcohol, tobacco or illicit drugs  Current medications and supplements including  opioid prescriptions. Patient is not currently taking opioid prescriptions. Functional ability and status Nutritional status Physical activity Advanced directives List of other physicians Hospitalizations, surgeries, and ER visits in previous 12 months Vitals Screenings to include cognitive, depression, and falls Referrals and appointments  In addition, I have reviewed and discussed with patient certain preventive protocols, quality metrics, and best practice recommendations. A written personalized care plan for preventive services as well as general preventive health recommendations were provided to patient.   Due to this being a telephonic visit, the after visit summary with patients personalized plan was offered to patient via mail or my-chart. Patient would like to access on my-chart.    Marta Antu, LPN   03/29/5500  Nurse Health Advisor  Nurse Notes: None

## 2022-01-25 ENCOUNTER — Other Ambulatory Visit: Payer: Self-pay | Admitting: Family Medicine

## 2022-01-25 DIAGNOSIS — E1165 Type 2 diabetes mellitus with hyperglycemia: Secondary | ICD-10-CM

## 2022-01-27 ENCOUNTER — Telehealth: Payer: Self-pay

## 2022-01-27 NOTE — Telephone Encounter (Signed)
Patient states that she is calling regarding the Novolin N and her sliding scale. I don't see one from her last visit on 06/03/21.

## 2022-01-28 NOTE — Telephone Encounter (Signed)
Patient is not sure where she got that information from and states that she is taking her Toujeo and her Trulicity as prescribed.

## 2022-01-31 ENCOUNTER — Encounter: Payer: Self-pay | Admitting: Behavioral Health

## 2022-01-31 ENCOUNTER — Ambulatory Visit (INDEPENDENT_AMBULATORY_CARE_PROVIDER_SITE_OTHER): Payer: Medicare HMO | Admitting: Behavioral Health

## 2022-01-31 DIAGNOSIS — F411 Generalized anxiety disorder: Secondary | ICD-10-CM | POA: Diagnosis not present

## 2022-01-31 DIAGNOSIS — F331 Major depressive disorder, recurrent, moderate: Secondary | ICD-10-CM

## 2022-01-31 DIAGNOSIS — F5105 Insomnia due to other mental disorder: Secondary | ICD-10-CM

## 2022-01-31 DIAGNOSIS — F99 Mental disorder, not otherwise specified: Secondary | ICD-10-CM | POA: Diagnosis not present

## 2022-01-31 DIAGNOSIS — F3131 Bipolar disorder, current episode depressed, mild: Secondary | ICD-10-CM

## 2022-01-31 NOTE — Progress Notes (Signed)
Sue [name] [id number] [birth year] 71 y.o.  Virtual Visit via Telephone Note  I connected with pt on 01/31/22 at  9:30 AM EDT by telephone and verified that I am speaking with the correct person using two identifiers.   I discussed the limitations, risks, security and privacy concerns of performing an evaluation and management service by telephone and the availability of in person appointments. I also discussed with the patient that there may be a patient responsible charge related to this service. The patient expressed understanding and agreed to proceed.   I discussed the assessment and treatment plan with the patient. The patient was provided an opportunity to ask questions and all were answered. The patient agreed with the plan and demonstrated an understanding of the instructions.   The patient was advised to call back or seek an in-person evaluation if the symptoms worsen or if the condition fails to improve as anticipated.  I provided 20 minutes of non-face-to-face time during this encounter.  The patient was located at home.  The provider was located at [place].   Elwanda Brooklyn, NP   Subjective:   Patient ID:  Sue [name] is a 71 y.o. (DOB Aug 15, 1951) female.  Chief Complaint:  Chief Complaint  Patient presents with   Depression   Anxiety   Follow-up   Medication Refill    HPI 71 year old female presents to this office for follow up and medication management.  Says she is managing the best she can. Having some situational depression do to loss within family and friends. Says she is coping ok. She believes that prayer is only thing that can help the grief. Currently her anxiety level is 3/10 and depression is 4/10 but says that health issues keep her at this level. She does sleep 7-8 hours per night with aid of Ambien 12.5 mg CR.  She denies having any problems taking this medication. Denies any current mania which she said was dominate when she was younger.  No psychosis, no SI/HI. She has strong family hx of bipolar and schizophrenia on fathers side.    Unable to obtain prior medication list. Pt says she will obtain.    Review of Systems:  Review of Systems  Constitutional: Negative.   Allergic/Immunologic: Negative.   Neurological:  Positive for weakness and headaches.  Psychiatric/Behavioral:  Positive for dysphoric mood and sleep disturbance. The patient is nervous/anxious.    Medications: I have reviewed the patient's current medications.  Current Outpatient Medications  Medication Sig Dispense Refill   Accu-Chek FastClix Lancets MISC USE TO CHECK BLOOD SUGAR UP TO FOUR TIMES DAILY AS DIRECTED (Patient taking differently: See admin instructions. Up to 4 times a day) 306 each 0   albuterol (PROVENTIL HFA;VENTOLIN HFA) 108 (90 Base) MCG/ACT inhaler Inhale 1-2 puffs into the lungs every 6 (six) hours as needed for wheezing or shortness of breath.     albuterol (PROVENTIL) (2.5 MG/3ML) 0.083% nebulizer solution Take 3 mLs (2.5 mg total) by nebulization every 6 (six) hours as needed for wheezing or shortness of breath. 150 mL 1   allopurinol (ZYLOPRIM) 100 MG tablet      blood glucose meter kit and supplies KIT Dispense based on patient and insurance preference. Use up to four times daily as directed. (FOR ICD-9 250.00, 250.01). 1 each 0   busPIRone (BUSPAR) 15 MG tablet Take 1 tablet (15 mg total) by mouth 2 (two) times daily. 60 tablet 3   Dulaglutide (TRULICITY) 6.64 QI/3.4VQ SOPN Inject  0.75 mg into the skin once a week. 2 mL 3   famotidine (PEPCID) 20 MG tablet Take 20 mg by mouth daily as needed for heartburn or indigestion.     glucose blood (ACCU-CHEK GUIDE) test strip USE 1 STRIP TO CHECK GLUCOSE UP TO 4 TIMES DAILY 100 each 0   hydrALAZINE (APRESOLINE) 25 MG tablet Take 0.5 tablets (12.5 mg total) by mouth 3 (three) times daily. 135 tablet 3   insulin glargine, 1 Unit Dial, (TOUJEO SOLOSTAR) 300 UNIT/ML Solostar Pen Inject 40 Units  into the skin daily in the afternoon. 15 mL 3   Insulin Pen Needle 29G X 8MM MISC 1 Device by Does not apply route daily in the afternoon. 100 each 3   levothyroxine (SYNTHROID) 88 MCG tablet Take 1 tablet (88 mcg total) by mouth daily. 90 tablet 3   LORazepam (ATIVAN) 1 MG tablet Take 0.5-1.5 tablets (0.5-1.5 mg total) by mouth See admin instructions. Take 1-1.5 mg by mouth at bedtime and an additional 0.5 mg once a day as needed for anxiety 60 tablet 3   Magnesium 400 MG CAPS Take 1 capsule by mouth daily. 30 capsule 0   metoprolol succinate (TOPROL-XL) 50 MG 24 hr tablet TAKE 2 TABLETS BY MOUTH ONCE DAILY. TAKE IMMEDIATELY FOLLOWING A MEAL 180 tablet 1   NONFORMULARY OR COMPOUNDED ITEM Pt/ inr   Dx dvt   Tomorrow 04/05/2019  Please call office [phone] with results 1 each 0   NONFORMULARY OR COMPOUNDED ITEM PT/ inr    Dx dvt 1 each 0   NONFORMULARY OR COMPOUNDED ITEM Compression stockings  20-30 mm/hg  #1   Dx low ext edema 1 each 0   olmesartan (BENICAR) 20 MG tablet Take 1 tablet (20 mg total) by mouth daily. 30 tablet 2   OXYGEN Inhale 3-4 L/min into the lungs continuous.      potassium chloride SA (KLOR-CON M) 20 MEQ tablet Take 1 tablet (20 mEq total) by mouth daily. 90 tablet 0   QUEtiapine (SEROQUEL) 100 MG tablet TAKE 1 AND 1/2 TABLETS BY MOUTH AT BEDTIME 135 tablet 0   torsemide (DEMADEX) 20 MG tablet Take 3 tablets by mouth once daily 90 tablet 0   traMADol (ULTRAM) 50 MG tablet Take 1 tablet (50 mg total) by mouth every 6 (six) hours as needed. (Patient not taking: Reported on 08/23/2021) 15 tablet 0   zolpidem (AMBIEN CR) 12.5 MG CR tablet TAKE 1 TABLET BY MOUTH AT BEDTIME AS NEEDED FOR SLEEP. 30 tablet 0   No current facility-administered medications for this visit.    Medication Side Effects: None  Allergies:  Allergies  Allergen Reactions   Hydrocodone Other (See Comments)    Constipation and hallucinations   Norvasc [Amlodipine Besylate] Swelling and Other (See  Comments)    Marked swelling of the limbs   Tizanidine Other (See Comments)    After the 3rd dose, the patient's mouth began to feel numb and she felt like she was a having a "hot flash"    Past Medical History:  Diagnosis Date   Allergic rhinitis    Depression    Diabetes mellitus type 2, uncontrolled    Emphysema of lung ([place])    3L home O2   GERD (gastroesophageal reflux disease)    Hypertension    Hypothyroidism    Obesity, morbid, BMI 50 or higher ([place])    Stroke ([place]) 2016   TIA    Urine incontinence     Family History  Problem Relation Age of Onset   Heart disease Father        MVP and Pics Valve   Hypertension Father    Depression Father        Institutionalized x's 2 years   Bipolar disorder Father    Hypertension Sister    Diabetes Sister    Hyperlipidemia Sister    Heart disease Sister 42       MI   Heart disease Brother    Hypertension Brother    Schizophrenia Paternal Aunt    Depression Paternal Aunt    Anxiety disorder Paternal Aunt    Heart disease Paternal Aunt    Schizophrenia Paternal Aunt    Heart disease Paternal Uncle    Heart disease Paternal Grandmother    Asthma Son    Asthma Son     Social History   Socioeconomic History   Marital status: Divorced    Spouse name: Not on file   Number of children: 4   Years of education: 27   Highest education level: Associate degree: academic program  Occupational History   Occupation: disabled- Occupational hygienist Med  Tobacco Use   Smoking status: Former    Packs/day: 1.00    Years: 40.00    Pack years: 40.00    Types: Cigarettes    Start date: 07/21/1972    Quit date: 10/16/2011    Years since quitting: 10.3   Smokeless tobacco: Never  Vaping Use   Vaping Use: Never used  Substance and Sexual Activity   Alcohol use: Not Currently    Comment: Occ-- Wine   Drug use: No   Sexual activity: Not Currently  Other Topics Concern   Not on file  Social History Narrative      Originally from [place]. Has also lived in [place], [place]. She also previously lived in [place] for 20 years. No history of [place] Fever. Moved to [place] in 1989. 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 No pets currently. No bird exposure. Questionable previous mold exposure in her daughter's home. 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       Lives in [place] alone in assisted living.  Has multiple chronic illness.   Social Determinants of Health   Financial Resource Strain: Low Risk    Difficulty of Paying Living Expenses: Not hard at all  Food Insecurity: No Food Insecurity   Worried About Charity fundraiser in the Last Year: Never true   [place] in the Last Year: Never true  Transportation Needs: No Transportation Needs   Lack of Transportation (Medical): No   Lack of Transportation (Non-Medical): No  Physical Activity: Inactive   Days of Exercise per Week: 0 days   Minutes of Exercise per Session: 0 min  Stress: No Stress Concern Present   Feeling of Stress : Not at all  Social Connections: Moderately Integrated   Frequency of Communication with Friends and Family: More than three times a week   Frequency of Social Gatherings with Friends and Family: Three times a week   Attends Religious Services: 1 to 4 times per year   Active Member of Clubs or Organizations: Yes   Attends Archivist Meetings: More than 4 times per year  Marital Status: Divorced  Human resources officer Violence: Not At Risk   Fear of Current or Ex-Partner: No   Emotionally Abused: No   Physically Abused: No   Sexually Abused: No    Past Medical History, Surgical history, Social history, and Family history were reviewed and updated as appropriate.   Please see review of systems for further  details on the patient's review from today.   Objective:   Physical Exam:  There were no vitals taken for this visit.  Physical Exam Neurological:     Mental Status: She is alert and oriented to person, place, and time.  Psychiatric:        Attention and Perception: Attention and perception normal.        Mood and Affect: Mood normal.        Speech: Speech normal.        Behavior: Behavior normal. Behavior is cooperative.        Cognition and Memory: Cognition and memory normal.        Judgment: Judgment normal.     Comments: Insight intact    Lab Review:     Component Value Date/Time   NA 139 07/16/2021 1126   NA 137 05/27/2021 1511   K 3.1 (L) 07/16/2021 1126   CL 97 07/16/2021 1126   CO2 32 07/16/2021 1126   GLUCOSE 208 (H) 07/16/2021 1126   BUN 18 07/16/2021 1126   BUN 13 05/27/2021 1511   CREATININE 1.56 (H) 07/16/2021 1126   CREATININE 1.45 (H) 07/09/2021 1446   CALCIUM 8.7 07/16/2021 1126   PROT 7.9 04/05/2021 1146   ALBUMIN 3.6 04/05/2021 1146   AST 20 04/05/2021 1146   ALT 13 04/05/2021 1146   ALKPHOS 157 (H) 04/05/2021 1146   BILITOT 0.3 04/05/2021 1146   GFRNONAA 27 (L) 01/01/2020 0951   GFRAA 31 (L) 01/01/2020 0951       Component Value Date/Time   WBC 7.2 01/07/2020 1401   RBC 3.49 (L) 01/07/2020 1401   HGB 9.3 (L) 01/07/2020 1401   HGB 12.6 01/17/2018 1616   HCT 28.9 (L) 01/07/2020 1401   HCT 32.3 (L) 03/10/2019 0545   PLT 327.0 01/07/2020 1401   PLT 235 01/17/2018 1616   MCV 82.8 01/07/2020 1401   MCV 96 01/17/2018 1616   MCH 27.0 01/01/2020 1120   MCHC 32.1 01/07/2020 1401   RDW 17.3 (H) 01/07/2020 1401   RDW 14.0 01/17/2018 1616   LYMPHSABS 1.7 01/07/2020 1401   MONOABS 0.6 01/07/2020 1401   EOSABS 0.2 01/07/2020 1401   BASOSABS 0.0 01/07/2020 1401    No results found for: POCLITH, LITHIUM   No results found for: PHENYTOIN, PHENOBARB, VALPROATE, CBMZ   .res Assessment: Plan:     Recommendations/Plan   Greater than 50% of  30  min  telephonic interview with patient was spent on counseling and coordination of care.We reviewed her currently level of stability. She is having some situational depression which she believes will pass. She does not want to to add or change medications at this time.  Today we agreed to: Continue Zolpidem 12.5 mg CR Continue Buspar 15 mg twice daily Continue Ativan 0.5-1.5 tablets  (1 mg 0.5-1.5 mg total) by mouth. Take 1-1.5 mg at bedtime and an additional 0.5 mg once a day needed for anxiety. Continue Seroquel 150 mg daily at bedtime To contact this office if worsening symptoms Will follow up in 4 months to reassess. She is requesting video visit next time due to ambulation  and transportation issues. Provided emergency contact information Reviewed PDMP         There are no diagnoses linked to this encounter.  Please see After Visit Summary for patient specific instructions.      Future Appointments  Date Time Provider [place]  04/29/2022 12:30 PM Shamleffer, Melanie Crazier, MD [facility] None  06/03/2022  9:00 AM Lesle Chris A, NP [facility] None    No orders of the defined types were placed in this encounter.     -------------------------------

## 2022-02-10 ENCOUNTER — Other Ambulatory Visit: Payer: Self-pay | Admitting: Behavioral Health

## 2022-02-10 DIAGNOSIS — F411 Generalized anxiety disorder: Secondary | ICD-10-CM

## 2022-02-10 DIAGNOSIS — F3131 Bipolar disorder, current episode depressed, mild: Secondary | ICD-10-CM

## 2022-02-10 DIAGNOSIS — F331 Major depressive disorder, recurrent, moderate: Secondary | ICD-10-CM

## 2022-02-13 ENCOUNTER — Other Ambulatory Visit: Payer: Self-pay | Admitting: Behavioral Health

## 2022-02-13 DIAGNOSIS — F5105 Insomnia due to other mental disorder: Secondary | ICD-10-CM

## 2022-02-14 NOTE — Telephone Encounter (Signed)
Filled 01/14/22 appt on 9/22

## 2022-02-14 NOTE — Telephone Encounter (Signed)
Patient called in for refill on Zolpidem 12.5mg . Ph: [place] 9/22 Pharmacy [facility] [place]

## 2022-02-16 ENCOUNTER — Ambulatory Visit: Payer: Medicare HMO | Admitting: Internal Medicine

## 2022-02-23 ENCOUNTER — Other Ambulatory Visit: Payer: Self-pay | Admitting: Family Medicine

## 2022-02-23 DIAGNOSIS — R0609 Other forms of dyspnea: Secondary | ICD-10-CM

## 2022-02-23 DIAGNOSIS — R600 Localized edema: Secondary | ICD-10-CM

## 2022-02-24 ENCOUNTER — Other Ambulatory Visit: Payer: Self-pay | Admitting: Internal Medicine

## 2022-03-09 ENCOUNTER — Other Ambulatory Visit: Payer: Self-pay | Admitting: Family Medicine

## 2022-03-09 DIAGNOSIS — R600 Localized edema: Secondary | ICD-10-CM

## 2022-03-09 DIAGNOSIS — R0609 Other forms of dyspnea: Secondary | ICD-10-CM

## 2022-03-11 ENCOUNTER — Telehealth: Payer: Self-pay | Admitting: Family Medicine

## 2022-03-11 DIAGNOSIS — R0609 Other forms of dyspnea: Secondary | ICD-10-CM

## 2022-03-11 DIAGNOSIS — R600 Localized edema: Secondary | ICD-10-CM

## 2022-03-11 MED ORDER — TORSEMIDE 20 MG PO TABS: 60.0000 mg | ORAL_TABLET | Freq: Every day | ORAL | 0 refills | Status: DC

## 2022-03-11 NOTE — Telephone Encounter (Signed)
Rx sent 

## 2022-03-11 NOTE — Telephone Encounter (Signed)
Pt called stating that she has an appt on 7.3.23 to clear her for refills on her torsemide but she needs an emergency supply to get her to that appt.  Medication:   torsemide (DEMADEX) 20 MG tablet [[id number]]   Has the patient contacted their pharmacy? No. (If no, request that the patient contact the pharmacy for the refill.) (If yes, when and what did the pharmacy advise?)  Preferred Pharmacy (with phone number or street name):   [facility], [address]  [place], [place] [address]  Phone:  [phone]  Fax:  [phone]   Agent: Please be advised that RX refills may take up to 3 business days. We ask that you follow-up with your pharmacy.

## 2022-03-14 ENCOUNTER — Encounter: Payer: Self-pay | Admitting: Family Medicine

## 2022-03-14 ENCOUNTER — Ambulatory Visit (INDEPENDENT_AMBULATORY_CARE_PROVIDER_SITE_OTHER): Payer: Medicare HMO | Admitting: Family Medicine

## 2022-03-14 VITALS — BP 142/88 | HR 90 | Temp 98.5°F | Resp 22 | Ht 63.0 in | Wt 285.0 lb

## 2022-03-14 DIAGNOSIS — R0609 Other forms of dyspnea: Secondary | ICD-10-CM | POA: Diagnosis not present

## 2022-03-14 DIAGNOSIS — I5032 Chronic diastolic (congestive) heart failure: Secondary | ICD-10-CM | POA: Diagnosis not present

## 2022-03-14 DIAGNOSIS — R600 Localized edema: Secondary | ICD-10-CM

## 2022-03-14 DIAGNOSIS — B182 Chronic viral hepatitis C: Secondary | ICD-10-CM

## 2022-03-14 DIAGNOSIS — J439 Emphysema, unspecified: Secondary | ICD-10-CM | POA: Diagnosis not present

## 2022-03-14 DIAGNOSIS — E1122 Type 2 diabetes mellitus with diabetic chronic kidney disease: Secondary | ICD-10-CM

## 2022-03-14 DIAGNOSIS — E039 Hypothyroidism, unspecified: Secondary | ICD-10-CM | POA: Diagnosis not present

## 2022-03-14 DIAGNOSIS — G629 Polyneuropathy, unspecified: Secondary | ICD-10-CM | POA: Diagnosis not present

## 2022-03-14 DIAGNOSIS — J9611 Chronic respiratory failure with hypoxia: Secondary | ICD-10-CM

## 2022-03-14 DIAGNOSIS — I824Y2 Acute embolism and thrombosis of unspecified deep veins of left proximal lower extremity: Secondary | ICD-10-CM | POA: Diagnosis not present

## 2022-03-14 DIAGNOSIS — E1169 Type 2 diabetes mellitus with other specified complication: Secondary | ICD-10-CM

## 2022-03-14 DIAGNOSIS — E785 Hyperlipidemia, unspecified: Secondary | ICD-10-CM

## 2022-03-14 DIAGNOSIS — N1832 Chronic kidney disease, stage 3b: Secondary | ICD-10-CM

## 2022-03-14 DIAGNOSIS — E1165 Type 2 diabetes mellitus with hyperglycemia: Secondary | ICD-10-CM | POA: Diagnosis not present

## 2022-03-14 LAB — COMPREHENSIVE METABOLIC PANEL
ALT: 17 U/L (ref 0–35)
AST: 26 U/L (ref 0–37)
Albumin: 3.9 g/dL (ref 3.5–5.2)
Alkaline Phosphatase: 110 U/L (ref 39–117)
BUN: 17 mg/dL (ref 6–23)
CO2: 32 mEq/L (ref 19–32)
Calcium: 9.9 mg/dL (ref 8.4–10.5)
Chloride: 97 mEq/L (ref 96–112)
Creatinine, Ser: 1.38 mg/dL — ABNORMAL HIGH (ref 0.40–1.20)
GFR: 39.02 mL/min — ABNORMAL LOW (ref >60.00)
Glucose, Bld: 201 mg/dL — ABNORMAL HIGH (ref 70–99)
Potassium: 3.6 mEq/L (ref 3.5–5.1)
Sodium: 139 mEq/L (ref 135–145)
Total Bilirubin: 0.4 mg/dL (ref 0.2–1.2)
Total Protein: 8.4 g/dL — ABNORMAL HIGH (ref 6.0–8.3)

## 2022-03-14 LAB — MICROALBUMIN / CREATININE URINE RATIO
Creatinine,U: 145.8 mg/dL
Microalb Creat Ratio: 0.5 mg/g (ref 0.0–30.0)
Microalb, Ur: <0.7 mg/dL (ref 0.0–1.9)

## 2022-03-14 LAB — CBC WITH DIFFERENTIAL/PLATELET
Basophils Absolute: 0 10*3/uL (ref 0.0–0.1)
Basophils Relative: 0.4 % (ref 0.0–3.0)
Eosinophils Absolute: 0.2 10*3/uL (ref 0.0–0.7)
Eosinophils Relative: 2.6 % (ref 0.0–5.0)
HCT: 38.7 % (ref 36.0–46.0)
Hemoglobin: 12.5 g/dL (ref 12.0–15.0)
Lymphocytes Relative: 35.1 % (ref 12.0–46.0)
Lymphs Abs: 2.4 10*3/uL (ref 0.7–4.0)
MCHC: 32.4 g/dL (ref 30.0–36.0)
MCV: 106.3 fl — ABNORMAL HIGH (ref 78.0–100.0)
Monocytes Absolute: 0.6 10*3/uL (ref 0.1–1.0)
Monocytes Relative: 8.4 % (ref 3.0–12.0)
Neutro Abs: 3.7 10*3/uL (ref 1.4–7.7)
Neutrophils Relative %: 53.5 % (ref 43.0–77.0)
Platelets: 201 10*3/uL (ref 150.0–400.0)
RBC: 3.64 Mil/uL — ABNORMAL LOW (ref 3.87–5.11)
RDW: 14.9 % (ref 11.5–15.5)
WBC: 6.8 10*3/uL (ref 4.0–10.5)

## 2022-03-14 LAB — LIPID PANEL
Cholesterol: 148 mg/dL (ref 0–200)
HDL: 34 mg/dL — ABNORMAL LOW (ref >39.00)
LDL Cholesterol: 83 mg/dL (ref 0–99)
NonHDL: 114.03
Total CHOL/HDL Ratio: 4
Triglycerides: 156 mg/dL — ABNORMAL HIGH (ref 0.0–149.0)
VLDL: 31.2 mg/dL (ref 0.0–40.0)

## 2022-03-14 LAB — TSH: TSH: 15.21 u[IU]/mL — ABNORMAL HIGH (ref 0.35–5.50)

## 2022-03-14 LAB — HEMOGLOBIN A1C: Hgb A1c MFr Bld: 7.7 % — ABNORMAL HIGH (ref 4.6–6.5)

## 2022-03-14 MED ORDER — TORSEMIDE 20 MG PO TABS: 60.0000 mg | ORAL_TABLET | Freq: Every day | ORAL | 4 refills | Status: DC

## 2022-03-14 MED ORDER — GABAPENTIN 100 MG PO CAPS: ORAL_CAPSULE | ORAL | 3 refills | Status: DC

## 2022-03-14 NOTE — Patient Instructions (Signed)

## 2022-03-14 NOTE — Assessment & Plan Note (Signed)
F/u endo  Check labs today

## 2022-03-14 NOTE — Assessment & Plan Note (Signed)
Per pulm On O2

## 2022-03-14 NOTE — Assessment & Plan Note (Signed)
Check labs today  con't synthroid

## 2022-03-14 NOTE — Assessment & Plan Note (Signed)
Tolerating statin, encouraged heart healthy diet, avoid trans fats, minimize simple carbs and saturated fats. Increase exercise as tolerated 

## 2022-03-14 NOTE — Assessment & Plan Note (Signed)
Encourage heart healthy diet such as MIND or DASH diet, increase exercise, avoid trans fats, simple carbohydrates and processed foods, consider a krill or fish or flaxseed oil cap daily.  °

## 2022-03-14 NOTE — Assessment & Plan Note (Signed)
Per cardiology 

## 2022-03-14 NOTE — Progress Notes (Signed)
Subjective:   By signing my name below, I, Carylon Perches, attest that this documentation has been prepared under the direction and in the presence of Ann Held DO 03/14/2022   Patient ID: Sue [name], female    DOB: May 23, 1951, 71 y.o.   MRN: [id number]  Chief Complaint  Patient presents with   Diabetes   Hypothyroidism   Follow-up    HPI Patient is in today for an office visit.  She states that for the past couple of weeks, her blood sugar levels were fluctuating from 100 - 250+.  She is currently taking 1.02 HE/5.2DP of Trulicity.  Lab Results  Component Value Date   HGBA1C 7.5 (H) 04/05/2021    She complains of stinging and burning in her legs that radiates to her toes. She states that pain appears mostly during the evening time but can happens at any time. She denies of any numbness, leg weakness or back issues. She occasionally gets shots for her knees but reports that she hasn't received one in a few years. She takes about four Ibuprofen's which she states helps alleviate her symptoms.   She has not had a vision exam in the past year   Past Medical History:  Diagnosis Date   Allergic rhinitis    Depression    Diabetes mellitus type 2, uncontrolled    Emphysema of lung ([place])    3L home O2   GERD (gastroesophageal reflux disease)    Hypertension    Hypothyroidism    Obesity, morbid, BMI 50 or higher ([place])    Stroke ([place]) 2016   TIA    Urine incontinence     Past Surgical History:  Procedure Laterality Date   ABDOMINAL HYSTERECTOMY     CESAREAN SECTION     COLONOSCOPY N/A 08/19/2019   Procedure: COLONOSCOPY;  Surgeon: Clarene Essex, MD;  Location: [facility];  Service: Endoscopy;  Laterality: N/A;   COLONOSCOPY WITH PROPOFOL N/A 08/05/2019   Procedure: COLONOSCOPY WITH PROPOFOL;  Surgeon: Wilford Corner, MD;  [facility];  Service: Gastroenterology;  Laterality: N/A;   COLONOSCOPY WITH PROPOFOL N/A 11/19/2019   Procedure: COLONOSCOPY  WITH PROPOFOL;  Surgeon: Ronald Lobo, MD;  [facility];  Service: Endoscopy;  Laterality: N/A;  Unprepped   COLONOSCOPY WITH PROPOFOL N/A 11/22/2019   Procedure: COLONOSCOPY WITH PROPOFOL;  Surgeon: Ronald Lobo, MD;  [facility];  Service: Endoscopy;  Laterality: N/A;   COLONOSCOPY WITH PROPOFOL N/A 11/23/2019   Procedure: COLONOSCOPY WITH PROPOFOL;  Surgeon: [name], MD;  [facility];  Service: Gastroenterology;  Laterality: N/A;   COLONOSCOPY WITH PROPOFOL N/A 11/29/2019   Procedure: COLONOSCOPY WITH PROPOFOL;  Surgeon: Wilford Corner, MD;  [facility];  Service: Endoscopy;  Laterality: N/A;   ENTEROSCOPY N/A 11/24/2019   Procedure: ENTEROSCOPY;  Surgeon: [name], MD;  [facility];  Service: Gastroenterology;  Laterality: N/A;   ENTEROSCOPY N/A 11/27/2019   Procedure: ENTEROSCOPY;  Surgeon: Wilford Corner, MD;  [facility];  Service: Endoscopy;  Laterality: N/A;   ESOPHAGOGASTRODUODENOSCOPY N/A 11/27/2019   Procedure: ESOPHAGOGASTRODUODENOSCOPY (EGD);  Surgeon: Wilford Corner, MD;  Location: [facility];  Service: Endoscopy;  Laterality: N/A;   ESOPHAGOGASTRODUODENOSCOPY (EGD) WITH PROPOFOL N/A 11/24/2019   Procedure: ESOPHAGOGASTRODUODENOSCOPY (EGD) WITH PROPOFOL;  Surgeon: [name], MD;  [facility];  Service: Gastroenterology;  Laterality: N/A;  PUSH enteroscopy   GIVENS CAPSULE STUDY N/A 11/19/2019   Procedure: GIVENS CAPSULE STUDY;  Surgeon: Ronald Lobo, MD;  Location: [facility];  Service: Endoscopy;  Laterality: N/A;  To be performed immediately following colonoscopy   GIVENS CAPSULE STUDY N/A 11/24/2019   Procedure: GIVENS CAPSULE STUDY;  Surgeon: [name], MD;  [facility];  Service: Gastroenterology;  Laterality: N/A;   GIVENS CAPSULE STUDY N/A 11/28/2019   Procedure: GIVENS CAPSULE STUDY;  Surgeon: Wilford Corner, MD;  [facility];  Service: Endoscopy;   Laterality: N/A;   HEMOSTASIS CLIP PLACEMENT  11/19/2019   Procedure: HEMOSTASIS CLIP PLACEMENT;  Surgeon: Ronald Lobo, MD;  [facility];  Service: Endoscopy;;   HEMOSTASIS CLIP PLACEMENT  11/22/2019   Procedure: HEMOSTASIS CLIP PLACEMENT;  Surgeon: Ronald Lobo, MD;  [facility];  Service: Endoscopy;;   HOT HEMOSTASIS N/A 11/24/2019   Procedure: HOT HEMOSTASIS (ARGON PLASMA COAGULATION/BICAP);  Surgeon: [name], MD;  Location: [facility];  Service: Gastroenterology;  Laterality: N/A;   HOT HEMOSTASIS N/A 11/27/2019   Procedure: HOT HEMOSTASIS (ARGON PLASMA COAGULATION/BICAP);  Surgeon: Wilford Corner, MD;  Location: [facility];  Service: Endoscopy;  Laterality: N/A;   SUBMUCOSAL TATTOO INJECTION  11/19/2019   Procedure: SUBMUCOSAL TATTOO INJECTION;  Surgeon: Ronald Lobo, MD;  [facility];  Service: Endoscopy;;    Family History  Problem Relation Age of Onset   Heart disease Father        MVP and Pics Valve   Hypertension Father    Depression Father        Institutionalized x's 2 years   Bipolar disorder Father    Hypertension Sister    Diabetes Sister    Hyperlipidemia Sister    Heart disease Sister 32       MI   Heart disease Brother    Hypertension Brother    Schizophrenia Paternal Aunt    Depression Paternal Aunt    Anxiety disorder Paternal Aunt    Heart disease Paternal Aunt    Schizophrenia Paternal Aunt    Heart disease Paternal Uncle    Heart disease Paternal Grandmother    Asthma Son    Asthma Son     Social History   Socioeconomic History   Marital status: Divorced    Spouse name: Not on file   Number of children: 4   Years of education: 75   Highest education level: Associate degree: academic program  Occupational History   Occupation: disabled- Occupational hygienist Med  Tobacco Use   Smoking status: Former    Packs/day: 1.00    Years: 40.00    Total pack years: 40.00    Types: Cigarettes    Start date:  07/21/1972    Quit date: 10/16/2011    Years since quitting: 10.4   Smokeless tobacco: Never  Vaping Use   Vaping Use: Never used  Substance and Sexual Activity   Alcohol use: Not Currently    Comment: Occ-- Wine   Drug use: No   Sexual activity: Not Currently  Other Topics Concern   Not on file  Social History Narrative      Originally from [place]. Has also lived in [place], [place]. She also previously lived in [place] for 20 years. No history of [place] Fever. Moved to [place] in 1989. 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 No pets currently. No bird exposure. Questionable previous mold exposure in her daughter's  home. 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       Lives in [place] alone in assisted living.  Has multiple chronic illness.   Social Determinants of Health   Financial Resource Strain: Low Risk  (01/24/2022)   Overall Financial Resource Strain (CARDIA)    Difficulty of Paying Living Expenses: Not hard at all  Food Insecurity: No Food Insecurity (01/24/2022)   Hunger Vital Sign    Worried About Running Out of Food in the Last Year: Never true    Ran Out of Food in the Last Year: Never true  Transportation Needs: No Transportation Needs (01/24/2022)   PRAPARE - Hydrologist (Medical): No    Lack of Transportation (Non-Medical): No  Physical Activity: Inactive (01/24/2022)   Exercise Vital Sign    Days of Exercise per Week: 0 days    Minutes of Exercise per Session: 0 min  Stress: No Stress Concern Present (01/24/2022)   [place]    Feeling of Stress : Not at all  Social Connections: Moderately Integrated (01/24/2022)   Social Connection and Isolation Panel  [NHANES]    Frequency of Communication with Friends and Family: More than three times a week    Frequency of Social Gatherings with Friends and Family: Three times a week    Attends Religious Services: 1 to 4 times per year    Active Member of Clubs or Organizations: Yes    Attends Archivist Meetings: More than 4 times per year    Marital Status: Divorced  Intimate Partner Violence: Not At Risk (01/24/2022)   Humiliation, Afraid, Rape, and Kick questionnaire    Fear of Current or Ex-Partner: No    Emotionally Abused: No    Physically Abused: No    Sexually Abused: No    Outpatient Medications Prior to Visit  Medication Sig Dispense Refill   Accu-Chek FastClix Lancets MISC USE TO CHECK BLOOD SUGAR UP TO FOUR TIMES DAILY AS DIRECTED (Patient taking differently: See admin instructions. Up to 4 times a day) 306 each 0   albuterol (PROVENTIL HFA;VENTOLIN HFA) 108 (90 Base) MCG/ACT inhaler Inhale 1-2 puffs into the lungs every 6 (six) hours as needed for wheezing or shortness of breath.     albuterol (PROVENTIL) (2.5 MG/3ML) 0.083% nebulizer solution Take 3 mLs (2.5 mg total) by nebulization every 6 (six) hours as needed for wheezing or shortness of breath. 150 mL 1   allopurinol (ZYLOPRIM) 100 MG tablet      blood glucose meter kit and supplies KIT Dispense based on patient and insurance preference. Use up to four times daily as directed. (FOR ICD-9 250.00, 250.01). 1 each 0   busPIRone (BUSPAR) 15 MG tablet TAKE 1 TABLET BY MOUTH 2 TIMES DAILY. 60 tablet 2   famotidine (PEPCID) 20 MG tablet Take 20 mg by mouth daily as needed for heartburn or indigestion.     glucose blood (ACCU-CHEK GUIDE) test strip USE 1 STRIP TO CHECK GLUCOSE UP TO 4 TIMES DAILY 100 each 0   insulin glargine, 1 Unit Dial, (TOUJEO SOLOSTAR) 300 UNIT/ML Solostar Pen Inject 40 Units into the skin daily in the afternoon. 15 mL 3   Insulin Pen Needle 29G X 8MM MISC 1 Device by Does not apply route daily in the  afternoon. 100 each 3   levothyroxine (SYNTHROID) 88 MCG tablet Take 1 tablet (88 mcg total) by mouth daily. 90 tablet 3  LORazepam (ATIVAN) 1 MG tablet Take 0.5-1.5 tablets (0.5-1.5 mg total) by mouth See admin instructions. Take 1-1.5 mg by mouth at bedtime and an additional 0.5 mg once a day as needed for anxiety 60 tablet 3   Magnesium 400 MG CAPS Take 1 capsule by mouth daily. 30 capsule 0   metoprolol succinate (TOPROL-XL) 50 MG 24 hr tablet TAKE 2 TABLETS BY MOUTH ONCE DAILY. TAKE IMMEDIATELY FOLLOWING A MEAL 180 tablet 1   NONFORMULARY OR COMPOUNDED ITEM Pt/ inr   Dx dvt   Tomorrow 04/05/2019  Please call office [phone] with results 1 each 0   NONFORMULARY OR COMPOUNDED ITEM PT/ inr    Dx dvt 1 each 0   NONFORMULARY OR COMPOUNDED ITEM Compression stockings  20-30 mm/hg  #1   Dx low ext edema 1 each 0   olmesartan (BENICAR) 20 MG tablet Take 1 tablet (20 mg total) by mouth daily. 30 tablet 2   OXYGEN Inhale 3-4 L/min into the lungs continuous.      potassium chloride SA (KLOR-CON M) 20 MEQ tablet Take 1 tablet (20 mEq total) by mouth daily. 90 tablet 0   QUEtiapine (SEROQUEL) 100 MG tablet TAKE 1 AND 1/2 TABLETS BY MOUTH AT BEDTIME 462 tablet 0   TRULICITY 7.03 JK/0.9FG SOPN INJECT 0.75 MG INTO THE SKIN ONCE A WEEK 4 mL 0   zolpidem (AMBIEN CR) 12.5 MG CR tablet TAKE 1 TABLET BY MOUTH AT BEDTIME AS NEEDED FOR SLEEP. 30 tablet 3   torsemide (DEMADEX) 20 MG tablet Take 3 tablets (60 mg total) by mouth daily. 60 tablet 0   hydrALAZINE (APRESOLINE) 25 MG tablet Take 0.5 tablets (12.5 mg total) by mouth 3 (three) times daily. 135 tablet 3   traMADol (ULTRAM) 50 MG tablet Take 1 tablet (50 mg total) by mouth every 6 (six) hours as needed. (Patient not taking: Reported on 08/23/2021) 15 tablet 0   No facility-administered medications prior to visit.    Allergies  Allergen Reactions   Hydrocodone Other (See Comments)    Constipation and hallucinations   Norvasc [Amlodipine Besylate]  Swelling and Other (See Comments)    Marked swelling of the limbs   Tizanidine Other (See Comments)    After the 3rd dose, the patient's mouth began to feel numb and she felt like she was a having a "hot flash"    Review of Systems  Constitutional:  Negative for chills, fever and malaise/fatigue.  HENT:  Negative for congestion and hearing loss.   Eyes:  Negative for blurred vision and discharge.  Respiratory:  Negative for cough, sputum production and shortness of breath.   Cardiovascular:  Negative for chest pain, palpitations and leg swelling.  Gastrointestinal:  Negative for abdominal pain, blood in stool, constipation, diarrhea, heartburn, nausea and vomiting.  Genitourinary:  Negative for dysuria, frequency, hematuria and urgency.  Musculoskeletal:  Negative for back pain, falls and myalgias.  Skin:  Negative for rash.  Neurological:  Negative for dizziness, sensory change, loss of consciousness, weakness and headaches.       (+) Stinging Sensation in Legs (+) Burning Sensation in Legs  Endo/Heme/Allergies:  Negative for environmental allergies. Does not bruise/bleed easily.  Psychiatric/Behavioral:  Negative for depression and suicidal ideas. The patient is not nervous/anxious and does not have insomnia.        Objective:    Physical Exam Vitals and nursing note reviewed.  Constitutional:      General: She is not in acute distress.    Appearance:  Normal appearance. She is not ill-appearing.  HENT:     Head: Normocephalic and atraumatic.     Right Ear: External ear normal.     Left Ear: External ear normal.  Eyes:     Extraocular Movements: Extraocular movements intact.     Pupils: Pupils are equal, round, and reactive to light.  Skin:    General: Skin is warm and dry.  Neurological:     Mental Status: She is alert and oriented to person, place, and time.  Psychiatric:        Judgment: Judgment normal.     BP (!) 142/88 (BP Location: Left Arm, Patient Position:  Sitting, Cuff Size: Normal)   Pulse 90   Temp 98.5 F (36.9 C) (Oral)   Resp (!) 22   Ht _0  (1.6 m)   Wt 285 lb (129.3 kg)   SpO2 94%   BMI 50.49 kg/m  Wt Readings from Last 3 Encounters:  03/14/22 285 lb (129.3 kg)  01/24/22 290 lb (131.5 kg)  07/16/21 294 lb 9.6 oz (133.6 kg)    Diabetic Foot Exam - Simple   No data filed    Lab Results  Component Value Date   WBC 7.2 01/07/2020   HGB 9.3 (L) 01/07/2020   HCT 28.9 (L) 01/07/2020   PLT 327.0 01/07/2020   GLUCOSE 208 (H) 07/16/2021   CHOL 147 04/05/2021   TRIG 150.0 (H) 04/05/2021   HDL 32.00 (L) 04/05/2021   LDLCALC 85 04/05/2021   ALT 13 04/05/2021   AST 20 04/05/2021   NA 139 07/16/2021   K 3.1 (L) 07/16/2021   CL 97 07/16/2021   CREATININE 1.56 (H) 07/16/2021   BUN 18 07/16/2021   CO2 32 07/16/2021   TSH 2.93 02/01/2021   INR 1.2 01/01/2020   HGBA1C 7.5 (H) 04/05/2021    Lab Results  Component Value Date   TSH 2.93 02/01/2021   Lab Results  Component Value Date   WBC 7.2 01/07/2020   HGB 9.3 (L) 01/07/2020   HCT 28.9 (L) 01/07/2020   MCV 82.8 01/07/2020   PLT 327.0 01/07/2020   Lab Results  Component Value Date   NA 139 07/16/2021   K 3.1 (L) 07/16/2021   CO2 32 07/16/2021   GLUCOSE 208 (H) 07/16/2021   BUN 18 07/16/2021   CREATININE 1.56 (H) 07/16/2021   BILITOT 0.3 04/05/2021   ALKPHOS 157 (H) 04/05/2021   AST 20 04/05/2021   ALT 13 04/05/2021   PROT 7.9 04/05/2021   ALBUMIN 3.6 04/05/2021   CALCIUM 8.7 07/16/2021   ANIONGAP 11 01/01/2020   EGFR 33 (L) 05/27/2021   GFR 33.84 (L) 07/16/2021   Lab Results  Component Value Date   CHOL 147 04/05/2021   Lab Results  Component Value Date   HDL 32.00 (L) 04/05/2021   Lab Results  Component Value Date   LDLCALC 85 04/05/2021   Lab Results  Component Value Date   TRIG 150.0 (H) 04/05/2021   Lab Results  Component Value Date   CHOLHDL 5 04/05/2021   Lab Results  Component Value Date   HGBA1C 7.5 (H) 04/05/2021        Assessment & Plan:   Problem List Items Addressed This Visit       Unprioritized   Chronic respiratory failure ([place]) (Chronic)    Per pulm On O2       Pulmonary emphysema ([place])   Chronic hepatitis C without hepatic coma ([place])   DOE (dyspnea on  exertion)   Relevant Medications   torsemide (DEMADEX) 20 MG tablet   Neuropathy   Relevant Medications   gabapentin (NEURONTIN) 100 MG capsule   Hypothyroidism    Check labs today  con't synthroid       Relevant Orders   TSH   Hyperlipidemia associated with type 2 diabetes mellitus ([place])    Tolerating statin, encouraged heart healthy diet, avoid trans fats, minimize simple carbs and saturated fats. Increase exercise as tolerated      Relevant Medications   torsemide (DEMADEX) 20 MG tablet   Hyperlipidemia    Encourage heart healthy diet such as MIND or DASH diet, increase exercise, avoid trans fats, simple carbohydrates and processed foods, consider a krill or fish or flaxseed oil cap daily.       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[facility])    F/u endo  Check labs today      Chronic diastolic heart failure ([place])    Per cardiology      Relevant Medications   torsemide (DEMADEX) 20 MG tablet   Other Visit Diagnoses     Type 2 diabetes mellitus with hyperglycemia, unspecified whether long term insulin use ([facility])    -  Primary   Relevant Orders   CBC with Differential/Platelet   Comprehensive metabolic panel   Hemoglobin A1c   Lipid panel   TSH   Microalbumin / creatinine urine ratio   Bilateral lower extremity edema       Relevant Medications   torsemide (DEMADEX) 20 MG tablet   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    Dispense:  60 tablet    Refill:  4   gabapentin (NEURONTIN) 100 MG capsule    Sig: 1-2 po qhs    Dispense:  60 capsule    Refill:  3    I, Ann Held, DO, personally preformed the services described in this documentation.  All medical record entries made by the scribe were at my direction and in my presence.  I have reviewed the chart and discharge instructions (if applicable) and agree that the record reflects my personal performance and is accurate and complete. 03/14/2022   I,Amber Collins,acting as a scribe for Ann Held, DO.,have documented all relevant documentation on the behalf of Ann Held, DO,as directed by  Ann Held, DO while in the presence of Ann Held, DO.   Ann Held, DO

## 2022-03-18 ENCOUNTER — Other Ambulatory Visit: Payer: Self-pay | Admitting: Internal Medicine

## 2022-03-28 ENCOUNTER — Telehealth: Payer: Self-pay | Admitting: Family Medicine

## 2022-03-28 DIAGNOSIS — R202 Paresthesia of skin: Secondary | ICD-10-CM

## 2022-03-28 DIAGNOSIS — R200 Anesthesia of skin: Secondary | ICD-10-CM

## 2022-03-28 NOTE — Telephone Encounter (Signed)
Patient states the gabapentin is not helping at all, and feels like the pain has gotten worse and more consistent.

## 2022-03-29 NOTE — Telephone Encounter (Signed)
Left message on machine to call back  

## 2022-03-29 NOTE — Telephone Encounter (Signed)
Referral placed and patient notified.  

## 2022-03-29 NOTE — Telephone Encounter (Signed)
Patient stated that she is having shooting stabbing pain that goes from her knee down to her toes.  She has stopped the gabapentin.  Also she has some tingling in her right arm to her chest (she did not call 911, but was about to because how bad it was).   She feels like it is not her knee, her knee is not that bad.  With this refer to neuro?  What dx would you like to use?

## 2022-03-30 ENCOUNTER — Encounter: Payer: Self-pay | Admitting: Neurology

## 2022-04-12 ENCOUNTER — Other Ambulatory Visit: Payer: Self-pay | Admitting: Behavioral Health

## 2022-04-12 DIAGNOSIS — F411 Generalized anxiety disorder: Secondary | ICD-10-CM

## 2022-04-13 ENCOUNTER — Other Ambulatory Visit: Payer: Self-pay | Admitting: Family Medicine

## 2022-04-13 NOTE — Telephone Encounter (Signed)
Filled 7/1 appt 9/22

## 2022-04-13 NOTE — Telephone Encounter (Signed)
Pt called and said that she is out of her ativan and needs it to be filled ASAP.

## 2022-04-20 ENCOUNTER — Encounter (INDEPENDENT_AMBULATORY_CARE_PROVIDER_SITE_OTHER): Payer: Self-pay

## 2022-04-28 NOTE — Progress Notes (Signed)
Name: Sue [name]  MRN/ DOB: [id number], 1950/10/20   Age/ Sex: 71 y.o., female    PCP: Carollee Herter, Alferd Apa, DO   Reason for Endocrinology Evaluation: Type 2 Diabetes Mellitus/ Hypothyroidism     Date of Initial Endocrinology Visit: 05/18/2021    PATIENT IDENTIFIER: Sue [name] is a 71 y.o. female with a past medical history of T2DM, HTN, COPD and Hypothyroidism . The patient presented for initial endocrinology clinic visit on 05/18/2021 for consultative assistance with her diabetes management.    HPI: Sue [name] is accompanied by her daughter [name]    Diagnosed with DM years ago  Prior Medications tried/Intolerance: Restart metformin due to low GFR, started Trulicity 11/6627 Hemoglobin A1c has ranged from 6.7% in 2020, peaking at 10.6% in 2019.   On her initial visit to our clinic she had an A1c of 7.5%, she was on metformin, glipizide, and Lantus.  We stopped metformin due to low GFR, as well as glipizide.  We continued basal insulin and started Trulicity   THYROID HISTORY: She was diagnosed with hypothyroidism for years. Has been on LT-4 replacement  No proir radiation exposure or sx     SUBJECTIVE:   During the last visit (06/03/2021): A1c 7.5% we stopped metformin, glipizide and switch Lantus to Toujeo and started Trulicity  Today (47/65/46): Sue [name] is here for a follow up on diabetes and thyroid management.  She is accompanied by her granddaughter. She has NOT been to our clinic in 11 months. checks her blood sugars 1 times daily. The patient has not had hypoglycemic episodes since the last clinic visit   She is confused about her meds and has not been taking levothyroxine    Denies nausea but no vomiting  She has diarrhea alternating with constipation that she attributes to IBS/diverticulitis   She has  burning sensation in her feet and legs with tingling , OTC meds have not helped , schedule with neuro     HOME DIABETES REGIMEN: Toujeo 60 units  daily Trulicity 5.03 mg weekly Levothyroxine 88 mcg daily      Statin: no  ACE-I/ARB: yes Prior Diabetic Education: no   METER DOWNLOAD SUMMARY: Did not bring     DIABETIC COMPLICATIONS: Microvascular complications:  CKD III, neuropathy Denies: retinopathy  Last eye exam: Completed 2022  Macrovascular complications:   Denies: CAD, PVD, CVA   PAST HISTORY: Past Medical History:  Past Medical History:  Diagnosis Date   Allergic rhinitis    Depression    Diabetes mellitus type 2, uncontrolled    Emphysema of lung ([place])    3L home O2   GERD (gastroesophageal reflux disease)    Hypertension    Hypothyroidism    Obesity, morbid, BMI 50 or higher ([place])    Stroke ([place]) 2016   TIA    Urine incontinence    Past Surgical History:  Past Surgical History:  Procedure Laterality Date   ABDOMINAL HYSTERECTOMY     CESAREAN SECTION     COLONOSCOPY N/A 08/19/2019   Procedure: COLONOSCOPY;  Surgeon: Clarene Essex, MD;  Location: [facility];  Service: Endoscopy;  Laterality: N/A;   COLONOSCOPY WITH PROPOFOL N/A 08/05/2019   Procedure: COLONOSCOPY WITH PROPOFOL;  Surgeon: Wilford Corner, MD;  Location: [facility];  Service: Gastroenterology;  Laterality: N/A;   COLONOSCOPY WITH PROPOFOL N/A 11/19/2019   Procedure: COLONOSCOPY WITH PROPOFOL;  Surgeon: Ronald Lobo, MD;  Location: [facility];  Service: Endoscopy;  Laterality: N/A;  Unprepped   COLONOSCOPY WITH  PROPOFOL N/A 11/22/2019   Procedure: COLONOSCOPY WITH PROPOFOL;  Surgeon: Ronald Lobo, MD;  [facility]: [facility];  Service: Endoscopy;  Laterality: N/A;   COLONOSCOPY WITH PROPOFOL N/A 11/23/2019   Procedure: COLONOSCOPY WITH PROPOFOL;  Surgeon: [name], MD;  [facility];  Service: Gastroenterology;  Laterality: N/A;   COLONOSCOPY WITH PROPOFOL N/A 11/29/2019   Procedure: COLONOSCOPY WITH PROPOFOL;  Surgeon: Wilford Corner, MD;  [facility];  Service: Endoscopy;  Laterality: N/A;    ENTEROSCOPY N/A 11/24/2019   Procedure: ENTEROSCOPY;  Surgeon: [name], MD;  [facility];  Service: Gastroenterology;  Laterality: N/A;   ENTEROSCOPY N/A 11/27/2019   Procedure: ENTEROSCOPY;  Surgeon: Wilford Corner, MD;  [facility];  Service: Endoscopy;  Laterality: N/A;   ESOPHAGOGASTRODUODENOSCOPY N/A 11/27/2019   Procedure: ESOPHAGOGASTRODUODENOSCOPY (EGD);  Surgeon: Wilford Corner, MD;  Location: [facility];  Service: Endoscopy;  Laterality: N/A;   ESOPHAGOGASTRODUODENOSCOPY (EGD) WITH PROPOFOL N/A 11/24/2019   Procedure: ESOPHAGOGASTRODUODENOSCOPY (EGD) WITH PROPOFOL;  Surgeon: [name], MD;  [facility];  Service: Gastroenterology;  Laterality: N/A;  PUSH enteroscopy   GIVENS CAPSULE STUDY N/A 11/19/2019   Procedure: GIVENS CAPSULE STUDY;  Surgeon: Ronald Lobo, MD;  [facility];  Service: Endoscopy;  Laterality: N/A;  To be performed immediately following colonoscopy   GIVENS CAPSULE STUDY N/A 11/24/2019   Procedure: GIVENS CAPSULE STUDY;  Surgeon: [name], MD;  [facility];  Service: Gastroenterology;  Laterality: N/A;   GIVENS CAPSULE STUDY N/A 11/28/2019   Procedure: GIVENS CAPSULE STUDY;  Surgeon: Wilford Corner, MD;  [facility];  Service: Endoscopy;  Laterality: N/A;   HEMOSTASIS CLIP PLACEMENT  11/19/2019   Procedure: HEMOSTASIS CLIP PLACEMENT;  Surgeon: Ronald Lobo, MD;  [facility];  Service: Endoscopy;;   HEMOSTASIS CLIP PLACEMENT  11/22/2019   Procedure: HEMOSTASIS CLIP PLACEMENT;  Surgeon: Ronald Lobo, MD;  [facility];  Service: Endoscopy;;   HOT HEMOSTASIS N/A 11/24/2019   Procedure: HOT HEMOSTASIS (ARGON PLASMA COAGULATION/BICAP);  Surgeon: [name], MD;  Location: [facility];  Service: Gastroenterology;  Laterality: N/A;   HOT HEMOSTASIS N/A 11/27/2019   Procedure: HOT HEMOSTASIS (ARGON PLASMA COAGULATION/BICAP);  Surgeon: Wilford Corner, MD;  Location: [facility];  Service: Endoscopy;  Laterality: N/A;   SUBMUCOSAL TATTOO INJECTION  11/19/2019   Procedure: SUBMUCOSAL TATTOO INJECTION;  Surgeon: Ronald Lobo, MD;  [facility];  Service: Endoscopy;;    Social History:  reports that she quit smoking about 10 years ago. Her smoking use included cigarettes. She started smoking about 49 years ago. She has a 40.00 pack-year smoking history. She has never used smokeless tobacco. She reports that she does not currently use alcohol. She reports that she does not use drugs. Family History:  Family History  Problem Relation Age of Onset   Heart disease Father        MVP and Pics Valve   Hypertension Father    Depression Father        Institutionalized x's 2 years   Bipolar disorder Father    Hypertension Sister    Diabetes Sister    Hyperlipidemia Sister    Heart disease Sister 51       MI   Heart disease Brother    Hypertension Brother    Schizophrenia Paternal Aunt    Depression Paternal Aunt    Anxiety disorder Paternal Aunt    Heart disease Paternal Aunt    Schizophrenia Paternal Aunt    Heart disease Paternal Uncle  Heart disease Paternal Grandmother    Asthma Son    Asthma Son      HOME MEDICATIONS: Allergies as of 04/29/2022       Reactions   Hydrocodone Other (See Comments)   Constipation and hallucinations   Norvasc [amlodipine Besylate] Swelling, Other (See Comments)   Marked swelling of the limbs   Tizanidine Other (See Comments)   After the 3rd dose, the patient's mouth began to feel numb and she felt like she was a having a "hot flash"        Medication List        Accurate as of April 29, 2022 12:27 PM. If you have any questions, ask your nurse or doctor.          Accu-Chek Guide test strip Generic drug: glucose blood USE 1 STRIP TO CHECK GLUCOSE UP TO 4 TIMES DAILY   Accu-Chek Softclix Lancets lancets USE 1  TO CHECK GLUCOSE UP TO 4 TIMES DAILY   albuterol 108 (90 Base) MCG/ACT  inhaler Commonly known as: VENTOLIN HFA Inhale 1-2 puffs into the lungs every 6 (six) hours as needed for wheezing or shortness of breath.   albuterol (2.5 MG/3ML) 0.083% nebulizer solution Commonly known as: PROVENTIL Take 3 mLs (2.5 mg total) by nebulization every 6 (six) hours as needed for wheezing or shortness of breath.   allopurinol 100 MG tablet Commonly known as: ZYLOPRIM   blood glucose meter kit and supplies Kit Dispense based on patient and insurance preference. Use up to four times daily as directed. (FOR ICD-9 250.00, 250.01).   busPIRone 15 MG tablet Commonly known as: BUSPAR TAKE 1 TABLET BY MOUTH 2 TIMES DAILY.   famotidine 20 MG tablet Commonly known as: PEPCID Take 20 mg by mouth daily as needed for heartburn or indigestion.   gabapentin 100 MG capsule Commonly known as: NEURONTIN 1-2 po qhs   hydrALAZINE 25 MG tablet Commonly known as: APRESOLINE Take 0.5 tablets (12.5 mg total) by mouth 3 (three) times daily.   Insulin Pen Needle 29G X 8MM Misc 1 Device by Does not apply route daily in the afternoon.   levothyroxine 88 MCG tablet Commonly known as: SYNTHROID Take 1 tablet (88 mcg total) by mouth daily.   LORazepam 1 MG tablet Commonly known as: ATIVAN TAKE 1/2 TO 1 AND 1/2 TABLETS AT BEDTIME AND AN ADDITIONAL 1/2 TAB ONCE A DAY AS NEEDED FOR AXIETY   Magnesium 400 MG Caps Take 1 capsule by mouth daily.   metoprolol succinate 50 MG 24 hr tablet Commonly known as: TOPROL-XL TAKE 2 TABLETS BY MOUTH ONCE DAILY. TAKE IMMEDIATELY FOLLOWING A MEAL   NONFORMULARY OR COMPOUNDED ITEM Pt/ inr   Dx dvt   Tomorrow 04/05/2019  Please call office [phone] with results   NONFORMULARY OR COMPOUNDED ITEM PT/ inr    Dx dvt   NONFORMULARY OR COMPOUNDED ITEM Compression stockings  20-30 mm/hg  #1   Dx low ext edema   olmesartan 20 MG tablet Commonly known as: BENICAR Take 1 tablet (20 mg total) by mouth daily.   OXYGEN Inhale 3-4 L/min into the lungs  continuous.   potassium chloride SA 20 MEQ tablet Commonly known as: KLOR-CON M Take 1 tablet (20 mEq total) by mouth daily.   QUEtiapine 100 MG tablet Commonly known as: SEROQUEL TAKE 1 AND 1/2 TABLETS BY MOUTH AT BEDTIME   torsemide 20 MG tablet Commonly known as: DEMADEX Take 3 tablets (60 mg total) by mouth daily.   Toujeo  SoloStar 300 UNIT/ML Solostar Pen Generic drug: insulin glargine (1 Unit Dial) Inject 40 Units into the skin daily in the afternoon.   Trulicity 0.35 KK/9.3GH Sopn Generic drug: Dulaglutide INJECT 1 SYRINGE (0.75 MG) SUBCUTANEOUSLY ONCE A WEEK   zolpidem 12.5 MG CR tablet Commonly known as: AMBIEN CR TAKE 1 TABLET BY MOUTH AT BEDTIME AS NEEDED FOR SLEEP.         ALLERGIES: Allergies  Allergen Reactions   Hydrocodone Other (See Comments)    Constipation and hallucinations   Norvasc [Amlodipine Besylate] Swelling and Other (See Comments)    Marked swelling of the limbs   Tizanidine Other (See Comments)    After the 3rd dose, the patient's mouth began to feel numb and she felt like she was a having a "hot flash"         OBJECTIVE:   VITAL SIGNS: BP 128/72 (BP Location: Left Arm, Patient Position: Sitting, Cuff Size: Large)   Pulse 62   Ht _0  (1.6 m)   Wt 283 lb (128.4 kg)   SpO2 94%   BMI 50.13 kg/m    PHYSICAL EXAM:  General: Pt appears well and is in NAD  Lungs: Clear with good BS bilat with no rales, rhonchi, or wheezes  Heart: RRR  Extremities: Trace edema   Neuro: MS is good with appropriate affect, pt is alert and Ox3   DM Foot Exam 18/2023 The skin of the feet is intact without sores or ulcerations. The pedal pulses are undetectable due to edema  The sensation is intact to a screening 5.07, 10 gram monofilament bilaterally    DATA REVIEWED:  Lab Results  Component Value Date   HGBA1C 7.7 (H) 03/14/2022   HGBA1C 7.5 (H) 04/05/2021   HGBA1C 7.7 (H) 02/01/2021   Lab Results  Component Value Date   MICROALBUR  <0.7 03/14/2022   LDLCALC 83 03/14/2022   CREATININE 1.38 (H) 03/14/2022   Lab Results  Component Value Date   MICRALBCREAT 0.5 03/14/2022    Lab Results  Component Value Date   CHOL 148 03/14/2022   HDL 34.00 (L) 03/14/2022   LDLCALC 83 03/14/2022   TRIG 156.0 (H) 03/14/2022   CHOLHDL 4 03/14/2022       10/18/2021 Glucose 193 BUN 11 Creatinine 1.45 GFR 39 PTH 62 ASSESSMENT / PLAN / RECOMMENDATIONS:   1) Type 2 Diabetes Mellitus, Sub-Optimally controlled, With CKD III, and neuropathic  complications - Most recent A1c of 7.7 %. Goal A1c < 7.0 %.    - A1c is stable  - In office BG 171 mg/dL. Per pt home BG's trend to 200 mg/dL at times  - Will increase insulin and Trulicity as below  - Off Metformin due to low GFR   MEDICATIONS: Increase  Toujeo 64 units daily  Increase  Trulicity 1.5 mg ONCE weekly     EDUCATION / INSTRUCTIONS: BG monitoring instructions: Patient is instructed to check her blood sugars 1 times a day, fasting . Call [place] if: BG persistently < 70  I reviewed the Rule of 15 for the treatment of hypoglycemia in detail with the patient. Literature supplied.   2) Diabetic complications:  Eye: Does not have known diabetic retinopathy.  Neuro/ Feet: Does  have known diabetic peripheral neuropathy. Renal: Patient does  have known baseline CKD. She is  on an ACEI/ARB at present.Check urine albumin/creatinine ratio yearly starting at time of diagnosis.      3) Hypothyroidism :  - She has been  off LT-4 replacement. Pt tells me she has not taken it because she is confused about her meds. - I have advised her to take Levothyroxine daily and to use a pill box - I have asked her to get daughter's help to help with medications if possible  - I explained to the pt she is at risk for developing myxedema coma if she is not on sufficient LT-4 replacement  - A refill was sent today  Medication Restart Levothyroxine 88 mcg daily   F/U in 6  months   Signed electronically by: Mack Guise, MD  [facility]  [facility] [address] [place], [place] [address] Phone: [phone] FAX: [phone]   CC: Ann Held, DO [address] [place] [address] Phone: [phone]  Fax: [phone]    Return to Endocrinology clinic as below: Future Appointments  Date Time Provider [place]  04/29/2022 12:30 PM Lakita Sahlin, Melanie Crazier, MD [facility] None  06/03/2022  9:00 AM Elwanda Brooklyn, NP [facility] None  06/09/2022  1:00 PM Shellia Carwin, MD [facility] None

## 2022-04-29 ENCOUNTER — Ambulatory Visit: Payer: Medicare HMO | Admitting: Internal Medicine

## 2022-04-29 ENCOUNTER — Encounter: Payer: Self-pay | Admitting: Internal Medicine

## 2022-04-29 VITALS — BP 128/72 | HR 62 | Ht 63.0 in | Wt 283.0 lb

## 2022-04-29 DIAGNOSIS — E1142 Type 2 diabetes mellitus with diabetic polyneuropathy: Secondary | ICD-10-CM

## 2022-04-29 DIAGNOSIS — E1122 Type 2 diabetes mellitus with diabetic chronic kidney disease: Secondary | ICD-10-CM | POA: Diagnosis not present

## 2022-04-29 DIAGNOSIS — Z794 Long term (current) use of insulin: Secondary | ICD-10-CM

## 2022-04-29 DIAGNOSIS — E1165 Type 2 diabetes mellitus with hyperglycemia: Secondary | ICD-10-CM | POA: Diagnosis not present

## 2022-04-29 DIAGNOSIS — N1832 Chronic kidney disease, stage 3b: Secondary | ICD-10-CM | POA: Diagnosis not present

## 2022-04-29 DIAGNOSIS — E039 Hypothyroidism, unspecified: Secondary | ICD-10-CM | POA: Diagnosis not present

## 2022-04-29 LAB — POCT GLUCOSE (DEVICE FOR HOME USE): Glucose Fasting, POC: 171 mg/dL — AB (ref 70–99)

## 2022-04-29 MED ORDER — TOUJEO SOLOSTAR 300 UNIT/ML ~~LOC~~ SOPN: 64.0000 [IU] | PEN_INJECTOR | Freq: Every day | SUBCUTANEOUS | 6 refills | Status: DC

## 2022-04-29 MED ORDER — LEVOTHYROXINE SODIUM 88 MCG PO TABS: 88.0000 ug | ORAL_TABLET | Freq: Every day | ORAL | 3 refills | Status: DC

## 2022-04-29 MED ORDER — INSULIN PEN NEEDLE 29G X 8MM MISC: 1.0000 | Freq: Every day | 3 refills | Status: DC

## 2022-04-29 MED ORDER — TRULICITY 1.5 MG/0.5ML ~~LOC~~ SOAJ: 1.5000 mg | SUBCUTANEOUS | 3 refills | Status: DC

## 2022-04-29 NOTE — Patient Instructions (Addendum)
-   Increase Toujeo 64 units daily  - Increase Trulicity 1.5 mg ONCE weekly     HOW TO TREAT LOW BLOOD SUGARS (Blood sugar LESS THAN 70 MG/DL) Please follow the RULE OF 15 for the treatment of hypoglycemia treatment (when your (blood sugars are less than 70 mg/dL)   STEP 1: Take 15 grams of carbohydrates when your blood sugar is low, which includes:  3-4 GLUCOSE TABS  OR 3-4 OZ OF JUICE OR REGULAR SODA OR ONE TUBE OF GLUCOSE GEL    STEP 2: RECHECK blood sugar in 15 MINUTES STEP 3: If your blood sugar is still low at the 15 minute recheck --> then, go back to STEP 1 and treat AGAIN with another 15 grams of carbohydrates.

## 2022-05-11 ENCOUNTER — Other Ambulatory Visit: Payer: Self-pay | Admitting: Behavioral Health

## 2022-05-11 DIAGNOSIS — F411 Generalized anxiety disorder: Secondary | ICD-10-CM

## 2022-05-12 ENCOUNTER — Telehealth: Payer: Self-pay | Admitting: Behavioral Health

## 2022-05-12 ENCOUNTER — Other Ambulatory Visit: Payer: Self-pay | Admitting: Behavioral Health

## 2022-05-12 DIAGNOSIS — F3131 Bipolar disorder, current episode depressed, mild: Secondary | ICD-10-CM

## 2022-05-12 DIAGNOSIS — F331 Major depressive disorder, recurrent, moderate: Secondary | ICD-10-CM

## 2022-05-12 NOTE — Telephone Encounter (Signed)
Filled 8/2 appt 9/22

## 2022-05-12 NOTE — Telephone Encounter (Signed)
Pended.

## 2022-05-12 NOTE — Telephone Encounter (Signed)
Pt requesting RF Lorazepam [facility] [address]

## 2022-06-03 ENCOUNTER — Telehealth (INDEPENDENT_AMBULATORY_CARE_PROVIDER_SITE_OTHER): Payer: Medicare HMO | Admitting: Behavioral Health

## 2022-06-03 ENCOUNTER — Other Ambulatory Visit: Payer: Self-pay | Admitting: Behavioral Health

## 2022-06-03 ENCOUNTER — Encounter: Payer: Self-pay | Admitting: Behavioral Health

## 2022-06-03 DIAGNOSIS — F411 Generalized anxiety disorder: Secondary | ICD-10-CM

## 2022-06-03 DIAGNOSIS — F3131 Bipolar disorder, current episode depressed, mild: Secondary | ICD-10-CM

## 2022-06-03 DIAGNOSIS — F5105 Insomnia due to other mental disorder: Secondary | ICD-10-CM

## 2022-06-03 DIAGNOSIS — F99 Mental disorder, not otherwise specified: Secondary | ICD-10-CM

## 2022-06-03 DIAGNOSIS — F331 Major depressive disorder, recurrent, moderate: Secondary | ICD-10-CM | POA: Diagnosis not present

## 2022-06-03 MED ORDER — LORAZEPAM 1 MG PO TABS: ORAL_TABLET | ORAL | 2 refills | Status: DC

## 2022-06-03 MED ORDER — QUETIAPINE FUMARATE 200 MG PO TABS: 200.0000 mg | ORAL_TABLET | Freq: Every day | ORAL | 1 refills | Status: DC

## 2022-06-03 NOTE — Progress Notes (Signed)
Sue [name] [id number] 09-Oct-1950 71 y.o.  Virtual Visit via Video Note  I connected with pt @ on 06/03/22 at  9:00 AM EDT by a video enabled telemedicine application and verified that I am speaking with the correct person using two identifiers.   I discussed the limitations of evaluation and management by telemedicine and the availability of in person appointments. The patient expressed understanding and agreed to proceed.  I discussed the assessment and treatment plan with the patient. The patient was provided an opportunity to ask questions and all were answered. The patient agreed with the plan and demonstrated an understanding of the instructions.   The patient was advised to call back or seek an in-person evaluation if the symptoms worsen or if the condition fails to improve as anticipated.  I provided 30 minutes of non-face-to-face time during this encounter.  The patient was located at home.  The provider was located at [place].   Elwanda Brooklyn, NP   Subjective:   Patient ID:  Sue [name] is a 71 y.o. (DOB 1951-09-11) female.  Chief Complaint:  Chief Complaint  Patient presents with   Anxiety   Depression   Follow-up   Medication Refill   Patient Education   Insomnia    HPI 71 year old female presents to this office for follow up and medication management.  Says she is managing the best she can. Her complaint today is that sleeping problems have worsened. Says that she is waking up often during the night. She is requesting medication that may help.  Currently her anxiety level is 3/10 and depression is 3/10 but says that health issues keep her at this level.  She denies having any problems taking this medication. Denies any current mania which she said was dominate when she was younger. No psychosis, no SI/HI. She has strong family hx of bipolar and schizophrenia on fathers side.    Unable to obtain prior medication list.      Sue [name]  presents for follow-up of    Review of Systems:  Review of Systems  Constitutional: Negative.   Respiratory:  Positive for shortness of breath.   Allergic/Immunologic: Negative.   Neurological:  Positive for weakness and headaches.  Psychiatric/Behavioral:  Positive for sleep disturbance.     Medications: I have reviewed the patient's current medications.  Current Outpatient Medications  Medication Sig Dispense Refill   QUEtiapine (SEROQUEL) 200 MG tablet Take 1 tablet (200 mg total) by mouth at bedtime. 90 tablet 1   Accu-Chek Softclix Lancets lancets USE 1  TO CHECK GLUCOSE UP TO 4 TIMES DAILY 100 each 0   albuterol (PROVENTIL HFA;VENTOLIN HFA) 108 (90 Base) MCG/ACT inhaler Inhale 1-2 puffs into the lungs every 6 (six) hours as needed for wheezing or shortness of breath.     albuterol (PROVENTIL) (2.5 MG/3ML) 0.083% nebulizer solution Take 3 mLs (2.5 mg total) by nebulization every 6 (six) hours as needed for wheezing or shortness of breath. 150 mL 1   allopurinol (ZYLOPRIM) 100 MG tablet      blood glucose meter kit and supplies KIT Dispense based on patient and insurance preference. Use up to four times daily as directed. (FOR ICD-9 250.00, 250.01). 1 each 0   busPIRone (BUSPAR) 15 MG tablet TAKE 1 TABLET BY MOUTH 2 TIMES DAILY. 60 tablet 2   Dulaglutide (TRULICITY) 1.5 IR/6.7EL SOPN Inject 1.5 mg into the skin once a week. 6 mL 3   famotidine (PEPCID) 20 MG  tablet Take 20 mg by mouth daily as needed for heartburn or indigestion.     gabapentin (NEURONTIN) 100 MG capsule 1-2 po qhs 60 capsule 3   glucose blood (ACCU-CHEK GUIDE) test strip USE 1 STRIP TO CHECK GLUCOSE UP TO 4 TIMES DAILY 100 each 0   hydrALAZINE (APRESOLINE) 25 MG tablet Take 0.5 tablets (12.5 mg total) by mouth 3 (three) times daily. 135 tablet 3   insulin glargine, 1 Unit Dial, (TOUJEO SOLOSTAR) 300 UNIT/ML Solostar Pen Inject 64 Units into the skin daily in the afternoon. 15 mL 6   Insulin Pen Needle 29G X 8MM MISC  1 Device by Does not apply route daily in the afternoon. 100 each 3   levothyroxine (SYNTHROID) 88 MCG tablet Take 1 tablet (88 mcg total) by mouth daily. 90 tablet 3   LORazepam (ATIVAN) 1 MG tablet TAKE 1/2 TO 1 AND 1/2 TABLETS AT BEDTIME AND AN ADDITIONAL 1/2 TAB ONCE A DAY AS NEEDED FOR AXIETY 60 tablet 2   Magnesium 400 MG CAPS Take 1 capsule by mouth daily. 30 capsule 0   metoprolol succinate (TOPROL-XL) 50 MG 24 hr tablet TAKE 2 TABLETS BY MOUTH ONCE DAILY. TAKE IMMEDIATELY FOLLOWING A MEAL 180 tablet 1   NONFORMULARY OR COMPOUNDED ITEM Pt/ inr   Dx dvt   Tomorrow 04/05/2019  Please call office [phone] with results 1 each 0   NONFORMULARY OR COMPOUNDED ITEM PT/ inr    Dx dvt 1 each 0   NONFORMULARY OR COMPOUNDED ITEM Compression stockings  20-30 mm/hg  #1   Dx low ext edema 1 each 0   olmesartan (BENICAR) 20 MG tablet Take 1 tablet (20 mg total) by mouth daily. 30 tablet 2   OXYGEN Inhale 3-4 L/min into the lungs continuous.      potassium chloride SA (KLOR-CON M) 20 MEQ tablet Take 1 tablet (20 mEq total) by mouth daily. 90 tablet 0   torsemide (DEMADEX) 20 MG tablet Take 3 tablets (60 mg total) by mouth daily. 60 tablet 4   zolpidem (AMBIEN CR) 12.5 MG CR tablet TAKE 1 TABLET BY MOUTH AT BEDTIME AS NEEDED FOR SLEEP. 30 tablet 3   No current facility-administered medications for this visit.    Medication Side Effects: None  Allergies:  Allergies  Allergen Reactions   Hydrocodone Other (See Comments)    Constipation and hallucinations   Norvasc [Amlodipine Besylate] Swelling and Other (See Comments)    Marked swelling of the limbs   Tizanidine Other (See Comments)    After the 3rd dose, the patient's mouth began to feel numb and she felt like she was a having a "hot flash"    Past Medical History:  Diagnosis Date   Allergic rhinitis    Depression    Diabetes mellitus type 2, uncontrolled    Emphysema of lung (HCC)    3L home O2   GERD (gastroesophageal reflux disease)     Hypertension    Hypothyroidism    Obesity, morbid, BMI 50 or higher ([facility])    Stroke ([place]) 2016   TIA    Urine incontinence     Family History  Problem Relation Age of Onset   Heart disease Father        MVP and Pics Valve   Hypertension Father    Depression Father        Institutionalized x's 2 years   Bipolar disorder Father    Hypertension Sister    Diabetes Sister    Hyperlipidemia  Sister    Heart disease Sister 74       MI   Heart disease Brother    Hypertension Brother    Schizophrenia Paternal Aunt    Depression Paternal Aunt    Anxiety disorder Paternal Aunt    Heart disease Paternal Aunt    Schizophrenia Paternal Aunt    Heart disease Paternal Uncle    Heart disease Paternal Grandmother    Asthma Son    Asthma Son     Social History   Socioeconomic History   Marital status: Divorced    Spouse name: Not on file   Number of children: 4   Years of education: 14   Highest education level: Associate degree: academic program  Occupational History   Occupation: disabled- Occupational hygienist Med  Tobacco Use   Smoking status: Former    Packs/day: 1.00    Years: 40.00    Total pack years: 40.00    Types: Cigarettes    Start date: 07/21/1972    Quit date: 10/16/2011    Years since quitting: 10.6   Smokeless tobacco: Never  Vaping Use   Vaping Use: Never used  Substance and Sexual Activity   Alcohol use: Not Currently    Comment: Occ-- Wine   Drug use: No   Sexual activity: Not Currently  Other Topics Concern   Not on file  Social History Narrative      Originally from [place]. Has also lived in [place], [place]. She also previously lived in [place] for 20 years. No history of [place] Fever. Moved to [place] in 1989. 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 No pets currently. No bird exposure. Questionable previous mold exposure in her daughter's home. 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       Lives in [place] alone in assisted living.  Has multiple chronic illness.   Social Determinants of Health   Financial Resource Strain: Low Risk  (01/24/2022)   Overall Financial Resource Strain (CARDIA)    Difficulty of Paying Living Expenses: Not hard at all  Food Insecurity: No Food Insecurity (01/24/2022)   Hunger Vital Sign    Worried About Running Out of Food in the Last Year: Never true    Ran Out of Food in the Last Year: Never true  Transportation Needs: No Transportation Needs (01/24/2022)   PRAPARE - Hydrologist (Medical): No    Lack of Transportation (Non-Medical): No  Physical Activity: Inactive (01/24/2022)   Exercise Vital Sign    Days of Exercise per Week: 0 days    Minutes of Exercise per Session: 0 min  Stress: No Stress Concern Present (01/24/2022)   [place]    Feeling of Stress : Not at all  Social Connections: Moderately Integrated (01/24/2022)   Social Connection and Isolation Panel [NHANES]    Frequency of Communication with Friends and Family: More than three times a week    Frequency of Social Gatherings with Friends and Family: Three times a week    Attends Religious Services: 1 to 4 times per year    Active Member of Clubs or Organizations: Yes    Attends Archivist Meetings: More than 4 times per year  Marital Status: Divorced  Human resources officer Violence: Not At Risk (01/24/2022)   Humiliation, Afraid, Rape, and Kick questionnaire    Fear of Current or Ex-Partner: No    Emotionally Abused: No    Physically Abused: No    Sexually Abused: No    Past Medical History, Surgical history, Social history, and Family history were reviewed and updated as appropriate.   Please see review of systems for  further details on the patient's review from today.   Objective:   Physical Exam:  There were no vitals taken for this visit.  Physical Exam Neurological:     Mental Status: She is alert and oriented to person, place, and time.  Psychiatric:        Attention and Perception: Attention and perception normal.        Mood and Affect: Mood normal.        Speech: Speech normal.        Behavior: Behavior normal. Behavior is cooperative.        Cognition and Memory: Cognition and memory normal.        Judgment: Judgment normal.     Comments: Insight intact     Lab Review:     Component Value Date/Time   NA 139 03/14/2022 1156   NA 137 05/27/2021 1511   K 3.6 03/14/2022 1156   CL 97 03/14/2022 1156   CO2 32 03/14/2022 1156   GLUCOSE 201 (H) 03/14/2022 1156   BUN 17 03/14/2022 1156   BUN 13 05/27/2021 1511   CREATININE 1.38 (H) 03/14/2022 1156   CREATININE 1.45 (H) 07/09/2021 1446   CALCIUM 9.9 03/14/2022 1156   PROT 8.4 (H) 03/14/2022 1156   ALBUMIN 3.9 03/14/2022 1156   AST 26 03/14/2022 1156   ALT 17 03/14/2022 1156   ALKPHOS 110 03/14/2022 1156   BILITOT 0.4 03/14/2022 1156   GFRNONAA 27 (L) 01/01/2020 0951   GFRAA 31 (L) 01/01/2020 0951       Component Value Date/Time   WBC 6.8 03/14/2022 1156   RBC 3.64 (L) 03/14/2022 1156   HGB 12.5 03/14/2022 1156   HGB 12.6 01/17/2018 1616   HCT 38.7 03/14/2022 1156   HCT 32.3 (L) 03/10/2019 0545   PLT 201.0 03/14/2022 1156   PLT 235 01/17/2018 1616   MCV 106.3 (H) 03/14/2022 1156   MCV 96 01/17/2018 1616   MCH 27.0 01/01/2020 1120   MCHC 32.4 03/14/2022 1156   RDW 14.9 03/14/2022 1156   RDW 14.0 01/17/2018 1616   LYMPHSABS 2.4 03/14/2022 1156   MONOABS 0.6 03/14/2022 1156   EOSABS 0.2 03/14/2022 1156   BASOSABS 0.0 03/14/2022 1156    No results found for: "POCLITH", "LITHIUM"   No results found for: "PHENYTOIN", "PHENOBARB", "VALPROATE", "CBMZ"   .res Assessment: Plan:    Recommendations/Plan   Greater than  50% of  30 min  telephonic interview with patient was spent on counseling and coordination of care.We reviewed her currently level of stability. She is currently having problems sleeping again. I explained that due to her respiratory problems and other health issues that we had to be very careful to not over medicate with anything that could further depress CNS and her breathing. Mirtazapine was not an option due to hx of prolonged QT interval. She understood that we are limited at this point to safely add another medication. I recommended she try Melatonin 5 mg to her regimen or try natural teas before bedtime like Chamomile.  She also sleep with TV  on and further educated her on good sleep hygiene...tv off and dark room.  Today we agreed to: Continue Zolpidem 12.5 mg CR Continue Buspar 15 mg twice daily Continue Ativan 0.5-1.5 tablets  (1 mg 0.5-1.5 mg total) by mouth. Take 1-1.5 mg at bedtime and an additional 0.5 mg once a day needed for anxiety. Increased  Seroquel to 200 mg daily at bedtime To contact this office if worsening symptoms Will follow up in 3 months to reassess. She is requesting video visit next time due to ambulation and transportation issues. Provided emergency contact information Reviewed PDMP      Sue [name] was seen today for anxiety, depression, follow-up, medication refill, patient education and insomnia.  Diagnoses and all orders for this visit:  Generalized anxiety disorder -     QUEtiapine (SEROQUEL) 200 MG tablet; Take 1 tablet (200 mg total) by mouth at bedtime. -     LORazepam (ATIVAN) 1 MG tablet; TAKE 1/2 TO 1 AND 1/2 TABLETS AT BEDTIME AND AN ADDITIONAL 1/2 TAB ONCE A DAY AS NEEDED FOR AXIETY  Major depressive disorder, recurrent episode, moderate (HCC) -     QUEtiapine (SEROQUEL) 200 MG tablet; Take 1 tablet (200 mg total) by mouth at bedtime.  Bipolar affective disorder, currently depressed, mild (HCC) -     QUEtiapine (SEROQUEL) 200 MG tablet; Take 1 tablet  (200 mg total) by mouth at bedtime.  Insomnia due to other mental disorder -     QUEtiapine (SEROQUEL) 200 MG tablet; Take 1 tablet (200 mg total) by mouth at bedtime.     Please see After Visit Summary for patient specific instructions.  Future Appointments  Date Time Provider [place]  06/09/2022  1:00 PM Shellia Carwin, MD [facility] None  11/02/2022 11:10 AM Shamleffer, Melanie Crazier, MD [facility] None    No orders of the defined types were placed in this encounter.     -------------------------------

## 2022-06-07 ENCOUNTER — Other Ambulatory Visit: Payer: Self-pay | Admitting: Behavioral Health

## 2022-06-07 DIAGNOSIS — F411 Generalized anxiety disorder: Secondary | ICD-10-CM

## 2022-06-07 NOTE — Telephone Encounter (Signed)
Filled 8/30

## 2022-06-07 NOTE — Progress Notes (Unsigned)
Initial neurology clinic note  SERVICE DATE: 06/09/22  Reason for Evaluation: Consultation requested by Ann Held, * for an opinion regarding numbness and tingling. My final recommendations will be communicated back to the requesting physician by way of shared medical record or letter to requesting physician via [place] mail.  HPI: This is [name], a 71 y.o. right-handed female with a medical history of DM, HFpEF, hypothyroidism, HTN, COPD, depression, anxiety, possible TIA in 2016, and former smoker who presents to neurology clinic with the chief complaint of burning and stinging pain of legs and cramps. The patient is accompanied by daughter.  Symptoms started about 6 months ago. She has a burning and stinging pain in her feet and legs. She feels like something is squeezing her legs. She has the feeling of [place] in her legs. She feels that the left side is worse than the right. She reports that her left leg pain is near constant while the right seems more intermittent. The pain is mostly from the calf down, but occasional cramping in her thighs. She denies back pain, neck pain, and only rare headaches. She denies numbness or tingling in her hands or arms. Patient walks with a walker for the last 5 years. She has not had any recent falls.  Patient saw PCP (Dr. Carollee Herter) on 03/14/22 and mentioned stinging and burning in legs that was worse in the evening. She denied back pain. Patient was tried on gabapentin 100 mg that did not help. She did not feel good on the medication, so she stopped it. She took it about a week. She was referred to neurology for further evaluation.  Patient is on seroquel 200 mg at night to help sleep.  She takes vitamin D and vitamin B12 supplements. She is also taking a nerve supplement from [employer] [employer]) that she thinks is helping.  The patient has not had similar episodes of symptoms in the past.    The patient denies symptoms  suggestive of oculobulbar weakness including diplopia, ptosis, dysphagia, poor saliva control, dysarthria/dysphonia, impaired mastication, facial weakness/droop.  The patient does not report symptoms referable to autonomic dysfunction including impaired sweating, heat or cold intolerance, excessive mucosal dryness, gastroparetic early satiety, postprandial abdominal bloating, constipation, bowel or bladder dyscontrol, or syncope/presyncope/orthostatic intolerance.  She does not report any constitutional symptoms like fever, night sweats, anorexia or unintentional weight loss.  EtOH use: None  Restrictive diet? No Family history of neuropathy/myopathy/NM disease? None  Patient has never had an EMG. She has not had any physical therapy recently.   MEDICATIONS:  Outpatient Encounter Medications as of 06/09/2022  Medication Sig   Accu-Chek Softclix Lancets lancets USE 1  TO CHECK GLUCOSE UP TO 4 TIMES DAILY   albuterol (PROVENTIL HFA;VENTOLIN HFA) 108 (90 Base) MCG/ACT inhaler Inhale 1-2 puffs into the lungs every 6 (six) hours as needed for wheezing or shortness of breath.   albuterol (PROVENTIL) (2.5 MG/3ML) 0.083% nebulizer solution Take 3 mLs (2.5 mg total) by nebulization every 6 (six) hours as needed for wheezing or shortness of breath.   allopurinol (ZYLOPRIM) 100 MG tablet    blood glucose meter kit and supplies KIT Dispense based on patient and insurance preference. Use up to four times daily as directed. (FOR ICD-9 250.00, 250.01).   busPIRone (BUSPAR) 15 MG tablet TAKE 1 TABLET BY MOUTH TWICE A DAY   Dulaglutide (TRULICITY) 1.5 EV/0.3JK SOPN Inject 1.5 mg into the skin once a week.   famotidine (PEPCID)  20 MG tablet Take 20 mg by mouth daily as needed for heartburn or indigestion.   glucose blood (ACCU-CHEK GUIDE) test strip USE 1 STRIP TO CHECK GLUCOSE UP TO 4 TIMES DAILY   insulin glargine, 1 Unit Dial, (TOUJEO SOLOSTAR) 300 UNIT/ML Solostar Pen Inject 64 Units into the skin daily in  the afternoon.   Insulin Pen Needle 29G X 8MM MISC 1 Device by Does not apply route daily in the afternoon.   levothyroxine (SYNTHROID) 88 MCG tablet Take 1 tablet (88 mcg total) by mouth daily.   LORazepam (ATIVAN) 1 MG tablet TAKE 1/2 TO 1 AND 1/2 TABLETS AT BEDTIME AND AN ADDITIONAL 1/2 TAB ONCE A DAY AS NEEDED FOR AXIETY   Magnesium 400 MG CAPS Take 1 capsule by mouth daily.   metoprolol succinate (TOPROL-XL) 50 MG 24 hr tablet TAKE 2 TABLETS BY MOUTH ONCE DAILY. TAKE IMMEDIATELY FOLLOWING A MEAL   NONFORMULARY OR COMPOUNDED ITEM Pt/ inr   Dx dvt   Tomorrow 04/05/2019  Please call office [phone] with results   NONFORMULARY OR COMPOUNDED ITEM PT/ inr    Dx dvt   NONFORMULARY OR COMPOUNDED ITEM Compression stockings  20-30 mm/hg  #1   Dx low ext edema   OXYGEN Inhale 3-4 L/min into the lungs continuous.    potassium chloride SA (KLOR-CON M) 20 MEQ tablet Take 1 tablet (20 mEq total) by mouth daily.   QUEtiapine (SEROQUEL) 200 MG tablet Take 1 tablet (200 mg total) by mouth at bedtime.   torsemide (DEMADEX) 20 MG tablet Take 3 tablets (60 mg total) by mouth daily.   zolpidem (AMBIEN CR) 12.5 MG CR tablet TAKE 1 TABLET BY MOUTH EVERY DAY AT BEDTIME AS NEEDED FOR SLEEP   gabapentin (NEURONTIN) 100 MG capsule 1-2 po qhs (Patient not taking: Reported on 06/09/2022)   hydrALAZINE (APRESOLINE) 25 MG tablet Take 0.5 tablets (12.5 mg total) by mouth 3 (three) times daily. (Patient not taking: Reported on 06/09/2022)   olmesartan (BENICAR) 20 MG tablet Take 1 tablet (20 mg total) by mouth daily. (Patient not taking: Reported on 06/09/2022)   [DISCONTINUED] zolpidem (AMBIEN CR) 12.5 MG CR tablet TAKE 1 TABLET BY MOUTH AT BEDTIME AS NEEDED FOR SLEEP.   No facility-administered encounter medications on file as of 06/09/2022.    PAST MEDICAL HISTORY: Past Medical History:  Diagnosis Date   Allergic rhinitis    Depression    Diabetes mellitus type 2, uncontrolled    Emphysema of lung ([place])    3L home  O2   GERD (gastroesophageal reflux disease)    Hypertension    Hypothyroidism    Obesity, morbid, BMI 50 or higher ([place])    Stroke ([place]) 2016   TIA    Urine incontinence     PAST SURGICAL HISTORY: Past Surgical History:  Procedure Laterality Date   ABDOMINAL HYSTERECTOMY     CESAREAN SECTION     COLONOSCOPY N/A 08/19/2019   Procedure: COLONOSCOPY;  Surgeon: Clarene Essex, MD;  Location: [facility];  Service: Endoscopy;  Laterality: N/A;   COLONOSCOPY WITH PROPOFOL N/A 08/05/2019   Procedure: COLONOSCOPY WITH PROPOFOL;  Surgeon: Wilford Corner, MD;  Location: [facility];  Service: Gastroenterology;  Laterality: N/A;   COLONOSCOPY WITH PROPOFOL N/A 11/19/2019   Procedure: COLONOSCOPY WITH PROPOFOL;  Surgeon: Ronald Lobo, MD;  [facility]: [facility];  Service: Endoscopy;  Laterality: N/A;  Unprepped   COLONOSCOPY WITH PROPOFOL N/A 11/22/2019   Procedure: COLONOSCOPY WITH PROPOFOL;  Surgeon: Ronald Lobo, MD;  [facility];  Service: Endoscopy;  Laterality: N/A;   COLONOSCOPY WITH PROPOFOL N/A 11/23/2019   Procedure: COLONOSCOPY WITH PROPOFOL;  Surgeon: [name], MD;  [facility];  Service: Gastroenterology;  Laterality: N/A;   COLONOSCOPY WITH PROPOFOL N/A 11/29/2019   Procedure: COLONOSCOPY WITH PROPOFOL;  Surgeon: Wilford Corner, MD;  [facility];  Service: Endoscopy;  Laterality: N/A;   ENTEROSCOPY N/A 11/24/2019   Procedure: ENTEROSCOPY;  Surgeon: [name], MD;  [facility];  Service: Gastroenterology;  Laterality: N/A;   ENTEROSCOPY N/A 11/27/2019   Procedure: ENTEROSCOPY;  Surgeon: Wilford Corner, MD;  [facility];  Service: Endoscopy;  Laterality: N/A;   ESOPHAGOGASTRODUODENOSCOPY N/A 11/27/2019   Procedure: ESOPHAGOGASTRODUODENOSCOPY (EGD);  Surgeon: Wilford Corner, MD;  [facility];  Service: Endoscopy;  Laterality: N/A;   ESOPHAGOGASTRODUODENOSCOPY (EGD) WITH PROPOFOL N/A 11/24/2019   Procedure:  ESOPHAGOGASTRODUODENOSCOPY (EGD) WITH PROPOFOL;  Surgeon: [name], MD;  [facility];  Service: Gastroenterology;  Laterality: N/A;  PUSH enteroscopy   GIVENS CAPSULE STUDY N/A 11/19/2019   Procedure: GIVENS CAPSULE STUDY;  Surgeon: Ronald Lobo, MD;  [facility];  Service: Endoscopy;  Laterality: N/A;  To be performed immediately following colonoscopy   GIVENS CAPSULE STUDY N/A 11/24/2019   Procedure: GIVENS CAPSULE STUDY;  Surgeon: [name], MD;  [facility];  Service: Gastroenterology;  Laterality: N/A;   GIVENS CAPSULE STUDY N/A 11/28/2019   Procedure: GIVENS CAPSULE STUDY;  Surgeon: Wilford Corner, MD;  [facility];  Service: Endoscopy;  Laterality: N/A;   HEMOSTASIS CLIP PLACEMENT  11/19/2019   Procedure: HEMOSTASIS CLIP PLACEMENT;  Surgeon: Ronald Lobo, MD;  [facility];  Service: Endoscopy;;   HEMOSTASIS CLIP PLACEMENT  11/22/2019   Procedure: HEMOSTASIS CLIP PLACEMENT;  Surgeon: Ronald Lobo, MD;  [facility];  Service: Endoscopy;;   HOT HEMOSTASIS N/A 11/24/2019   Procedure: HOT HEMOSTASIS (ARGON PLASMA COAGULATION/BICAP);  Surgeon: [name], MD;  [facility];  Service: Gastroenterology;  Laterality: N/A;   HOT HEMOSTASIS N/A 11/27/2019   Procedure: HOT HEMOSTASIS (ARGON PLASMA COAGULATION/BICAP);  Surgeon: Wilford Corner, MD;  [facility];  Service: Endoscopy;  Laterality: N/A;   SUBMUCOSAL TATTOO INJECTION  11/19/2019   Procedure: SUBMUCOSAL TATTOO INJECTION;  Surgeon: Ronald Lobo, MD;  [facility];  Service: Endoscopy;;    ALLERGIES: Allergies  Allergen Reactions   Hydrocodone Other (See Comments)    Constipation and hallucinations   Norvasc [Amlodipine Besylate] Swelling and Other (See Comments)    Marked swelling of the limbs   Tizanidine Other (See Comments)    After the 3rd dose, the patient's mouth began to feel numb and she felt like she was a having a  "hot flash"    FAMILY HISTORY: Family History  Problem Relation Age of Onset   Heart disease Father        MVP and Pics Valve   Hypertension Father    Depression Father        Institutionalized x's 2 years   Bipolar disorder Father    Hypertension Sister    Diabetes Sister    Hyperlipidemia Sister    Heart disease Sister 83       MI   Heart disease Brother    Hypertension Brother    Schizophrenia Paternal Aunt    Depression Paternal Aunt    Anxiety disorder Paternal Aunt    Heart disease Paternal Aunt    Schizophrenia Paternal Aunt    Heart disease Paternal Uncle    Heart  disease Paternal Grandmother    Asthma Son    Asthma Son     SOCIAL HISTORY: Social History   Tobacco Use   Smoking status: Former    Packs/day: 1.00    Years: 40.00    Total pack years: 40.00    Types: Cigarettes    Start date: 07/21/1972    Quit date: 10/16/2011    Years since quitting: 10.6   Smokeless tobacco: Never  Vaping Use   Vaping Use: Never used  Substance Use Topics   Alcohol use: Not Currently    Comment: Occ-- Wine   Drug use: No   Social History   Social History Narrative      Originally from [place]. Has also lived in [place], [place]. She also previously lived in [place] for 20 years. No history of [place]. Moved to [place] in 1989. 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 No pets currently. No bird exposure. Questionable previous mold exposure in her daughter's home. 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       Lives in [place] alone in assisted living.  Has multiple chronic illness.      Right Handed    Lives in a one story home      OBJECTIVE: PHYSICAL EXAM: BP (!) 156/94   Pulse 85   Ht '5\' 3"'  (1.6 m)   Wt 284 lb (128.8 kg)   SpO2 (!)  86% Comment: on 4L oxygen  BMI 50.31 kg/m   General: General appearance: Awake and alert. No distress. Cooperative with exam.  Skin: No obvious rash or jaundice. HEENT: Atraumatic. Anicteric. Lungs: Dyspneic with minimal exertion. Wearing O2 ([place]) Extremities: Bilateral lower extremity peripheral edema. Psych: Affect appropriate.  Neurological: Mental Status: Alert. Speech fluent. No pseudobulbar affect Cranial Nerves: CNII: No RAPD. Visual fields grossly intact. CNIII, IV, VI: PERRL. No nystagmus. EOMI. CN V: Facial sensation intact bilaterally to fine touch. CN VII: Facial muscles symmetric and strong. No ptosis at rest. CN VIII: Hearing grossly intact bilaterally. CN IX: No hypophonia. CN X: Palate elevates symmetrically. CN XI: Full strength shoulder shrug bilaterally. CN XII: Tongue protrusion full and midline. No atrophy or fasciculations. No significant dysarthria Motor: Tone is normal. No fasciculations in extremities. No atrophy.  Individual muscle group testing (MRC grade out of 5):  Movement     Neck flexion 5    Neck extension 5     Right Left   Shoulder abduction 5 5   Elbow flexion 5 5   Elbow extension 5 5   Finger abduction - FDI 5 5   Finger abduction - ADM 5 5   Finger extension 5 5   Finger distal flexion - 2/'3 5 5   ' Finger distal flexion - 4/'5 5 5   ' Thumb flexion - FPL 5 5   Thumb abduction - APB 5 5    Hip flexion 5 5   Hip extension 5 5   Hip adduction 5 5   Hip abduction 5 5   Knee extension 5 5   Knee flexion 5 5   Dorsiflexion 5 5   Plantarflexion 5 5   Great toe extension 5 5   Great toe flexion 5 5     Reflexes:  Right Left  Bicep 1+ 1+   Tricep 1+ 1+   BrRad 1+ 1+   Knee Trace Trace   Ankle 0 0    Pathological Reflexes: Babinski: mute response bilaterally Hoffman: absent bilaterally Troemner: absent bilaterally Sensation: Pinprick: Intact in bilateral upper extremities. 50% in left lower extremity from knee to toes  and 50% of normal in right foot Vibration: Intact in upper extremities. 6 seconds on right great toe, 5 seconds on left great toe Proprioception: Intact in bilateral great toes Coordination: Intact finger-to- nose-finger bilaterally. Romberg negative. Gait: Unable to rise from chair with arms crossed unassisted. Wide-based gait.  Lab and Test Review: Internal labs: Normal or unremarkable:  03/14/22: TSH: 15.21 HbA1c: 7.7 CMP significant for elevated glucose (201) and Cr (1.38) CBC significant for elevated MCV (106.3) B12 (01/07/20): 448  MRI brain wo contrast (01/01/20): CLINICAL DATA:  TIA. Transient left facial numbness and slurred speech.   EXAM: MRI HEAD WITHOUT CONTRAST   TECHNIQUE: Multiplanar, multiecho pulse sequences of the brain and surrounding structures were obtained without intravenous contrast.   COMPARISON:  Head CT 01/01/2020 and MRI 07/30/2014   FINDINGS: Brain: There is no evidence of acute infarct, intracranial hemorrhage, mass, midline shift, or extra-axial fluid collection. The ventricles and sulci are normal. T2 hyperintensities in the cerebral white matter bilaterally are unchanged from the prior MRI and nonspecific but compatible with minimal chronic small vessel ischemic disease.   Vascular: Major intracranial vascular flow voids are preserved.   Skull and upper cervical spine: Unremarkable bone marrow signal.   Sinuses/Orbits: Remote medial left orbital fracture. Chronic right sphenoid sinusitis. Clear mastoid air cells.   Other: None.   IMPRESSION: 1. No acute intracranial abnormality. 2. Minimal chronic small vessel ischemic disease.  ASSESSMENT: JONAYA FRESHOUR is a 71 y.o. female who presents for evaluation of numbness, tingling, and burning in feet and legs bilaterally. She has a relevant medical history of DM, HFpEF, hypothyroidism, HTN, COPD. Her neurological examination is pertinent for distal, symmetric sensory loss in bilateral lower  extremities and gait instability. Available diagnostic data is significant for B12 of 448 and HbA1c of 7.7. A recent Tsh was 15.21.   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 There may also be some contributions from peripheral edema related pain. I will check an EMG for severity and labs to look for other treatable causes of neuropathy.  PLAN: -Blood work: MMA, IFE, B6 -EMG: LLE > RLE (PN protocol) -Alpha lipoic acid 600 mg once or twice a day -Lidocaine cream PRN -Cramp OTC recommendations given (see AVS)   -Return to clinic in 6 months  The impression above as well as the plan as outlined below were extensively discussed with the patient (in the company of daughter) who voiced understanding. All questions were answered to their satisfaction.  When available, results of the above investigations and possible further recommendations will be communicated to the patient via telephone/MyChart. Patient to call office if not contacted after expected testing turnaround time.   Total time spent reviewing records, interview, history/exam, documentation, and coordination of care on day of encounter:  65 min   Thank you for allowing me to participate in patient's care.  If I can answer any additional questions, I would be pleased to do so.  Kai Levins, MD   CC: Carollee Herter, Alferd Apa, DO [address] [place] [id number]  CC: Referring provider: Ann Held, [place] [address]  [address] [place],  [place] [address]

## 2022-06-09 ENCOUNTER — Other Ambulatory Visit (INDEPENDENT_AMBULATORY_CARE_PROVIDER_SITE_OTHER): Payer: Medicare HMO

## 2022-06-09 ENCOUNTER — Encounter: Payer: Self-pay | Admitting: Neurology

## 2022-06-09 ENCOUNTER — Ambulatory Visit: Payer: Medicare HMO | Admitting: Neurology

## 2022-06-09 VITALS — BP 156/94 | HR 85 | Ht 63.0 in | Wt 284.0 lb

## 2022-06-09 DIAGNOSIS — I5032 Chronic diastolic (congestive) heart failure: Secondary | ICD-10-CM | POA: Diagnosis not present

## 2022-06-09 DIAGNOSIS — E1142 Type 2 diabetes mellitus with diabetic polyneuropathy: Secondary | ICD-10-CM

## 2022-06-09 DIAGNOSIS — R269 Unspecified abnormalities of gait and mobility: Secondary | ICD-10-CM

## 2022-06-09 DIAGNOSIS — R209 Unspecified disturbances of skin sensation: Secondary | ICD-10-CM

## 2022-06-09 NOTE — Patient Instructions (Signed)
The most likely cause of your symptoms is neuropathy. The most common cause of neuropathy is diabetes, but I want to do some lab work today to look for other causes.  I also want to do a muscle and nerve test called an EMG to see the extent of the nerve damage. You can schedule this before leaving today.  I will be in touch when I have the results of your testing.  For your leg pain: I recommend a supplement called alpha lipoic acid 600 mg once or twice daily. I recommend lidocaine cream to rub on where you have pain/tingling/burning. Wear gloves to prevent your hands from becoming numb.  Recommend the following measures that may provide some symptomatic benefit for muscle cramps: - Adequate oral clear fluid intake to maintain optimal hydration (about 2.5 liters, or around 8-10 glasses per day) Avoidance of caffeine Trial of DIET tonic water: About 1 glass, up to 6 times daily Magnesium oxide up to 400 mg by mouth twice daily, as needed (over the counter) Gentle muscle stretching routine, especially before bedtime  I want to see you back in clinic in 6 months.  The physicians and staff at [facility] are committed to providing excellent care. You may receive a survey requesting feedback about your experience at our office. We strive to receive "very good" responses to the survey questions. If you feel that your experience would prevent you from giving the office a "very good " response, please contact our office to try to remedy the situation. We may be reached at [phone]. Thank you for taking the time out of your busy day to complete the survey.  Kai Levins, MD [facility]  ELECTROMYOGRAM AND NERVE CONDUCTION STUDIES (EMG/NCS) INSTRUCTIONS  How to Prepare The neurologist conducting the EMG will need to know if you have certain medical conditions. Tell the neurologist and other EMG lab personnel if you: Have a pacemaker or any other electrical medical device Take  blood-thinning medications Have hemophilia, a blood-clotting disorder that causes prolonged bleeding Bathing Take a shower or bath shortly before your exam in order to remove oils from your skin. Don't apply lotions or creams before the exam.  What to Expect You'll likely be asked to change into a hospital gown for the procedure and lie down on an examination table. The following explanations can help you understand what will happen during the exam.  Electrodes. The neurologist or a technician places surface electrodes at various locations on your skin depending on where you're experiencing symptoms. Or the neurologist may insert needle electrodes at different sites depending on your symptoms.  Sensations. The electrodes will at times transmit a tiny electrical current that you may feel as a twinge or spasm. The needle electrode may cause discomfort or pain that usually ends shortly after the needle is removed. If you are concerned about discomfort or pain, you may want to talk to the neurologist about taking a short break during the exam.  Instructions. During the needle EMG, the neurologist will assess whether there is any spontaneous electrical activity when the muscle is at rest - activity that isn't present in healthy muscle tissue - and the degree of activity when you slightly contract the muscle.  He or she will give you instructions on resting and contracting a muscle at appropriate times. Depending on what muscles and nerves the neurologist is examining, he or she may ask you to change positions during the exam.  After your EMG You may experience some  temporary, minor bruising where the needle electrode was inserted into your muscle. This bruising should fade within several days. If it persists, contact your primary care doctor.

## 2022-06-13 ENCOUNTER — Encounter: Payer: Self-pay | Admitting: Neurology

## 2022-06-13 LAB — IMMUNOFIXATION ELECTROPHORESIS
IgG (Immunoglobin G), Serum: 2407 mg/dL — ABNORMAL HIGH (ref 600–1540)
IgM, Serum: 278 mg/dL (ref 50–300)
Immunoglobulin A: 568 mg/dL — ABNORMAL HIGH (ref 70–320)

## 2022-06-13 LAB — VITAMIN B6: Vitamin B6: 10 ng/mL (ref 2.1–21.7)

## 2022-06-13 LAB — METHYLMALONIC ACID, SERUM: Methylmalonic Acid, Quant: 190 nmol/L (ref 87–318)

## 2022-06-25 ENCOUNTER — Other Ambulatory Visit: Payer: Self-pay | Admitting: Family Medicine

## 2022-06-25 DIAGNOSIS — E876 Hypokalemia: Secondary | ICD-10-CM

## 2022-06-29 ENCOUNTER — Telehealth: Payer: Self-pay

## 2022-06-29 NOTE — Telephone Encounter (Signed)
Pt called and informed of Labs and results per Dr. Berdine Addison. She understood.

## 2022-07-01 ENCOUNTER — Other Ambulatory Visit: Payer: Self-pay | Admitting: Behavioral Health

## 2022-07-01 DIAGNOSIS — F411 Generalized anxiety disorder: Secondary | ICD-10-CM

## 2022-07-12 ENCOUNTER — Other Ambulatory Visit: Payer: Self-pay | Admitting: Behavioral Health

## 2022-07-12 DIAGNOSIS — F3131 Bipolar disorder, current episode depressed, mild: Secondary | ICD-10-CM

## 2022-07-12 DIAGNOSIS — F331 Major depressive disorder, recurrent, moderate: Secondary | ICD-10-CM

## 2022-07-22 ENCOUNTER — Telehealth: Payer: Medicare HMO | Admitting: Behavioral Health

## 2022-08-08 ENCOUNTER — Encounter: Payer: Medicare HMO | Admitting: Neurology

## 2022-08-15 ENCOUNTER — Other Ambulatory Visit: Payer: Self-pay | Admitting: Family Medicine

## 2022-08-23 ENCOUNTER — Telehealth: Payer: Self-pay | Admitting: Pharmacist

## 2022-08-23 MED ORDER — ROSUVASTATIN CALCIUM 10 MG PO TABS: 10.0000 mg | ORAL_TABLET | Freq: Every day | ORAL | 3 refills | Status: DC

## 2022-08-23 NOTE — Telephone Encounter (Signed)
Spoke to patient. She did not recall taking a statin in the past. Discussed benefits of statin therapy for diabetics and she agreed to try rosuvastatin 10mg  daily.  Rx sent to [facility].  Also scheduled follow up visit for November 14, 2022 with PCP.

## 2022-08-23 NOTE — Telephone Encounter (Signed)
-----   Message from Ann Held, DO sent at 08/23/2022 10:10 AM EST ----- Regarding: RE: Stain in diabetic I thought she refused or stopped due to myalgias but we can try again if she is willing  ----- Message ----- From: Cherre Robins, RPH-CPP Sent: 08/23/2022  12:00 AM EST To: Ann Held, DO Subject: Stain in diabetic                              Sue [name] was included on a list from [facility] as having type 2 DM but not taking a statin. She also has history of stroke / TIA.  Her last LDL was 83. Given her history would recommend trying to get LDL  < 70.  I did not see that she had tried a statin in past from our office.  Would you be OK starting a statin? I can reach out to her to discuss if you approve.

## 2022-08-30 ENCOUNTER — Other Ambulatory Visit: Payer: Self-pay | Admitting: Family Medicine

## 2022-09-01 ENCOUNTER — Emergency Department (HOSPITAL_COMMUNITY): Payer: Medicare HMO

## 2022-09-01 ENCOUNTER — Inpatient Hospital Stay (HOSPITAL_COMMUNITY)
Admission: EM | Admit: 2022-09-01 | Discharge: 2022-09-02 | DRG: 918 | Disposition: A | Discharge status: Home / Self Care | Payer: Medicare HMO | Attending: Internal Medicine | Admitting: Internal Medicine

## 2022-09-01 DIAGNOSIS — R410 Disorientation, unspecified: Principal | ICD-10-CM

## 2022-09-01 DIAGNOSIS — T68XXXA Hypothermia, initial encounter: Secondary | ICD-10-CM | POA: Diagnosis not present

## 2022-09-01 DIAGNOSIS — F32A Depression, unspecified: Secondary | ICD-10-CM | POA: Diagnosis present

## 2022-09-01 DIAGNOSIS — Z87891 Personal history of nicotine dependence: Secondary | ICD-10-CM

## 2022-09-01 DIAGNOSIS — K219 Gastro-esophageal reflux disease without esophagitis: Secondary | ICD-10-CM | POA: Diagnosis present

## 2022-09-01 DIAGNOSIS — Z825 Family history of asthma and other chronic lower respiratory diseases: Secondary | ICD-10-CM

## 2022-09-01 DIAGNOSIS — I10 Essential (primary) hypertension: Secondary | ICD-10-CM | POA: Diagnosis not present

## 2022-09-01 DIAGNOSIS — Z885 Allergy status to narcotic agent status: Secondary | ICD-10-CM

## 2022-09-01 DIAGNOSIS — Z8673 Personal history of transient ischemic attack (TIA), and cerebral infarction without residual deficits: Secondary | ICD-10-CM

## 2022-09-01 DIAGNOSIS — R531 Weakness: Secondary | ICD-10-CM | POA: Diagnosis not present

## 2022-09-01 DIAGNOSIS — R81 Glycosuria: Secondary | ICD-10-CM | POA: Diagnosis present

## 2022-09-01 DIAGNOSIS — I5032 Chronic diastolic (congestive) heart failure: Secondary | ICD-10-CM | POA: Diagnosis present

## 2022-09-01 DIAGNOSIS — Z79890 Hormone replacement therapy (postmenopausal): Secondary | ICD-10-CM

## 2022-09-01 DIAGNOSIS — I959 Hypotension, unspecified: Secondary | ICD-10-CM | POA: Diagnosis present

## 2022-09-01 DIAGNOSIS — Z0389 Encounter for observation for other suspected diseases and conditions ruled out: Secondary | ICD-10-CM | POA: Diagnosis not present

## 2022-09-01 DIAGNOSIS — Z20822 Contact with and (suspected) exposure to covid-19: Secondary | ICD-10-CM | POA: Diagnosis not present

## 2022-09-01 DIAGNOSIS — N179 Acute kidney failure, unspecified: Secondary | ICD-10-CM | POA: Diagnosis present

## 2022-09-01 DIAGNOSIS — Z79899 Other long term (current) drug therapy: Secondary | ICD-10-CM

## 2022-09-01 DIAGNOSIS — F418 Other specified anxiety disorders: Secondary | ICD-10-CM | POA: Diagnosis not present

## 2022-09-01 DIAGNOSIS — E876 Hypokalemia: Secondary | ICD-10-CM | POA: Diagnosis present

## 2022-09-01 DIAGNOSIS — N183 Chronic kidney disease, stage 3 unspecified: Secondary | ICD-10-CM | POA: Diagnosis present

## 2022-09-01 DIAGNOSIS — Z9981 Dependence on supplemental oxygen: Secondary | ICD-10-CM

## 2022-09-01 DIAGNOSIS — R0609 Other forms of dyspnea: Secondary | ICD-10-CM | POA: Diagnosis not present

## 2022-09-01 DIAGNOSIS — R680 Hypothermia, not associated with low environmental temperature: Secondary | ICD-10-CM | POA: Diagnosis present

## 2022-09-01 DIAGNOSIS — G9340 Encephalopathy, unspecified: Secondary | ICD-10-CM | POA: Diagnosis present

## 2022-09-01 DIAGNOSIS — I130 Hypertensive heart and chronic kidney disease with heart failure and stage 1 through stage 4 chronic kidney disease, or unspecified chronic kidney disease: Secondary | ICD-10-CM | POA: Diagnosis present

## 2022-09-01 DIAGNOSIS — E1122 Type 2 diabetes mellitus with diabetic chronic kidney disease: Secondary | ICD-10-CM | POA: Diagnosis not present

## 2022-09-01 DIAGNOSIS — T50901A Poisoning by unspecified drugs, medicaments and biological substances, accidental (unintentional), initial encounter: Secondary | ICD-10-CM | POA: Diagnosis not present

## 2022-09-01 DIAGNOSIS — Z6841 Body Mass Index (BMI) 40.0 and over, adult: Secondary | ICD-10-CM | POA: Diagnosis not present

## 2022-09-01 DIAGNOSIS — Z888 Allergy status to other drugs, medicaments and biological substances status: Secondary | ICD-10-CM

## 2022-09-01 DIAGNOSIS — D649 Anemia, unspecified: Secondary | ICD-10-CM | POA: Diagnosis present

## 2022-09-01 DIAGNOSIS — R4182 Altered mental status, unspecified: Secondary | ICD-10-CM | POA: Diagnosis not present

## 2022-09-01 DIAGNOSIS — Z8249 Family history of ischemic heart disease and other diseases of the circulatory system: Secondary | ICD-10-CM | POA: Diagnosis not present

## 2022-09-01 DIAGNOSIS — J439 Emphysema, unspecified: Secondary | ICD-10-CM | POA: Diagnosis present

## 2022-09-01 DIAGNOSIS — Z794 Long term (current) use of insulin: Secondary | ICD-10-CM | POA: Diagnosis not present

## 2022-09-01 DIAGNOSIS — F419 Anxiety disorder, unspecified: Secondary | ICD-10-CM | POA: Diagnosis present

## 2022-09-01 DIAGNOSIS — E039 Hypothyroidism, unspecified: Secondary | ICD-10-CM | POA: Diagnosis present

## 2022-09-01 DIAGNOSIS — Z818 Family history of other mental and behavioral disorders: Secondary | ICD-10-CM

## 2022-09-01 DIAGNOSIS — Z90710 Acquired absence of both cervix and uterus: Secondary | ICD-10-CM

## 2022-09-01 DIAGNOSIS — Z833 Family history of diabetes mellitus: Secondary | ICD-10-CM

## 2022-09-01 LAB — URINALYSIS, ROUTINE W REFLEX MICROSCOPIC
Bacteria, UA: NONE SEEN
Bilirubin Urine: NEGATIVE
Glucose, UA: >=500 mg/dL — AB
Hgb urine dipstick: NEGATIVE
Ketones, ur: NEGATIVE mg/dL
Leukocytes,Ua: NEGATIVE
Nitrite: NEGATIVE
Protein, ur: NEGATIVE mg/dL
Specific Gravity, Urine: 1.016 (ref 1.005–1.030)
pH: 5 (ref 5.0–8.0)

## 2022-09-01 LAB — COMPREHENSIVE METABOLIC PANEL
ALT: 19 U/L (ref 0–44)
AST: 33 U/L (ref 15–41)
Albumin: 3 g/dL — ABNORMAL LOW (ref 3.5–5.0)
Alkaline Phosphatase: 114 U/L (ref 38–126)
Anion gap: 12 (ref 5–15)
BUN: 28 mg/dL — ABNORMAL HIGH (ref 8–23)
CO2: 30 mmol/L (ref 22–32)
Calcium: 9.3 mg/dL (ref 8.9–10.3)
Chloride: 94 mmol/L — ABNORMAL LOW (ref 98–111)
Creatinine, Ser: 2.44 mg/dL — ABNORMAL HIGH (ref 0.44–1.00)
GFR, Estimated: 21 mL/min — ABNORMAL LOW (ref >60)
Glucose, Bld: 288 mg/dL — ABNORMAL HIGH (ref 70–99)
Potassium: 2.9 mmol/L — ABNORMAL LOW (ref 3.5–5.1)
Sodium: 136 mmol/L (ref 135–145)
Total Bilirubin: 0.6 mg/dL (ref 0.3–1.2)
Total Protein: 7.7 g/dL (ref 6.5–8.1)

## 2022-09-01 LAB — I-STAT CHEM 8, ED
BUN: 35 mg/dL — ABNORMAL HIGH (ref 8–23)
Calcium, Ion: 1.12 mmol/L — ABNORMAL LOW (ref 1.15–1.40)
Chloride: 94 mmol/L — ABNORMAL LOW (ref 98–111)
Creatinine, Ser: 2.4 mg/dL — ABNORMAL HIGH (ref 0.44–1.00)
Glucose, Bld: 295 mg/dL — ABNORMAL HIGH (ref 70–99)
HCT: 37 % (ref 36.0–46.0)
Hemoglobin: 12.6 g/dL (ref 12.0–15.0)
Potassium: 2.9 mmol/L — ABNORMAL LOW (ref 3.5–5.1)
Sodium: 138 mmol/L (ref 135–145)
TCO2: 33 mmol/L — ABNORMAL HIGH (ref 22–32)

## 2022-09-01 LAB — BRAIN NATRIURETIC PEPTIDE: B Natriuretic Peptide: 5.8 pg/mL (ref 0.0–100.0)

## 2022-09-01 LAB — CBC WITH DIFFERENTIAL/PLATELET
Abs Immature Granulocytes: 0.02 10*3/uL (ref 0.00–0.07)
Basophils Absolute: 0 10*3/uL (ref 0.0–0.1)
Basophils Relative: 0 %
Eosinophils Absolute: 0.1 10*3/uL (ref 0.0–0.5)
Eosinophils Relative: 2 %
HCT: 35.4 % — ABNORMAL LOW (ref 36.0–46.0)
Hemoglobin: 11.7 g/dL — ABNORMAL LOW (ref 12.0–15.0)
Immature Granulocytes: 0 %
Lymphocytes Relative: 36 %
Lymphs Abs: 1.9 10*3/uL (ref 0.7–4.0)
MCH: 32.1 pg (ref 26.0–34.0)
MCHC: 33.1 g/dL (ref 30.0–36.0)
MCV: 97 fL (ref 80.0–100.0)
Monocytes Absolute: 0.4 10*3/uL (ref 0.1–1.0)
Monocytes Relative: 8 %
Neutro Abs: 2.8 10*3/uL (ref 1.7–7.7)
Neutrophils Relative %: 54 %
Platelets: 159 10*3/uL (ref 150–400)
RBC: 3.65 MIL/uL — ABNORMAL LOW (ref 3.87–5.11)
RDW: 13.2 % (ref 11.5–15.5)
WBC: 5.2 10*3/uL (ref 4.0–10.5)
nRBC: 0 % (ref 0.0–0.2)

## 2022-09-01 LAB — RESP PANEL BY RT-PCR (RSV, FLU A&B, COVID)  RVPGX2
Influenza A by PCR: NEGATIVE
Influenza B by PCR: NEGATIVE
Resp Syncytial Virus by PCR: NEGATIVE
SARS Coronavirus 2 by RT PCR: NEGATIVE

## 2022-09-01 LAB — AMMONIA: Ammonia: 32 umol/L (ref 9–35)

## 2022-09-01 LAB — RAPID URINE DRUG SCREEN, HOSP PERFORMED
Amphetamines: NOT DETECTED
Barbiturates: NOT DETECTED
Benzodiazepines: POSITIVE — AB
Cocaine: NOT DETECTED
Opiates: NOT DETECTED
Tetrahydrocannabinol: NOT DETECTED

## 2022-09-01 LAB — LACTIC ACID, PLASMA
Lactic Acid, Venous: 2.2 mmol/L (ref 0.5–1.9)
Lactic Acid, Venous: 2.4 mmol/L (ref 0.5–1.9)

## 2022-09-01 LAB — APTT: aPTT: 27 seconds (ref 24–36)

## 2022-09-01 LAB — PROTIME-INR
INR: 1.2 (ref 0.8–1.2)
Prothrombin Time: 14.8 seconds (ref 11.4–15.2)

## 2022-09-01 MED ORDER — SODIUM CHLORIDE 0.9 % IV SOLN: 2.0000 g | INTRAVENOUS | Status: DC

## 2022-09-01 MED ORDER — VANCOMYCIN HCL 10 G IV SOLR
2250.0000 mg | Freq: Once | INTRAVENOUS | Status: AC
  Administered 2022-09-01: 2250 mg via INTRAVENOUS
  Filled 2022-09-01: qty 2250

## 2022-09-01 MED ORDER — LACTATED RINGERS IV BOLUS (SEPSIS)
1000.0000 mL | Freq: Once | INTRAVENOUS | Status: AC
  Administered 2022-09-01: 1000 mL via INTRAVENOUS

## 2022-09-01 MED ORDER — VANCOMYCIN VARIABLE DOSE PER UNSTABLE RENAL FUNCTION (PHARMACIST DOSING): Status: DC

## 2022-09-01 MED ORDER — VANCOMYCIN HCL IN DEXTROSE 1-5 GM/200ML-% IV SOLN: 1000.0000 mg | Freq: Once | INTRAVENOUS | Status: DC

## 2022-09-01 MED ORDER — METRONIDAZOLE 500 MG/100ML IV SOLN
500.0000 mg | Freq: Once | INTRAVENOUS | Status: AC
  Administered 2022-09-01: 500 mg via INTRAVENOUS
  Filled 2022-09-01: qty 100

## 2022-09-01 MED ORDER — SODIUM CHLORIDE 0.9 % IV SOLN
2.0000 g | Freq: Once | INTRAVENOUS | Status: AC
  Administered 2022-09-01: 2 g via INTRAVENOUS
  Filled 2022-09-01: qty 12.5

## 2022-09-01 MED ORDER — POTASSIUM CHLORIDE 10 MEQ/100ML IV SOLN
10.0000 meq | INTRAVENOUS | Status: AC
  Administered 2022-09-01 – 2022-09-02 (×3): 10 meq via INTRAVENOUS
  Filled 2022-09-01 (×3): qty 100

## 2022-09-01 NOTE — ED Provider Notes (Signed)
[facility]ENC[facility] DEPARTMENT Provider Note   CSN: [id number] Arrival date & time: 09/01/22  1847     History  Chief Complaint  Patient presents with   Altered Mental Status    Per ems pt c/o malaise, bodyaches x 3 days to family. During well check today, family found pt unresponsive. BP 80/50 on arrival     Sue [name] is a 71 y.o. female.  The history is provided by a relative and the EMS personnel. The history is limited by the condition of the patient. No language interpreter was used.  Altered Mental Status 71 year old female with history of hypothyroidism, HFpEF, CKD stage III, COPD liters oxygen, CVA, T2DM, DVT presented with altered mental status.  Per IMS and daughter, patient has been feeling unwell for the past 2 to 3 days, more lethargic than usual and feeling general malaise, chills, and bodyaches.  Daughter went to check on her earlier today and found her to be unresponsive.  BP measured at 80/50 on arrival.  She was given 800 cc of fluids on route.  Denies urinary symptoms, cough.  Daughter notes that she is on multiple psychiatric medications and she tends to take more than prescribed, so she thought lethargy was possibly due to her medications.     Home Medications Prior to Admission medications   Medication Sig Start Date End Date Taking? Authorizing Provider  Accu-Chek Softclix Lancets lancets USE 1  TO CHECK GLUCOSE 4 TIMES DAILY 06/27/22   Carollee Herter, Alferd Apa, DO  albuterol (PROVENTIL HFA;VENTOLIN HFA) 108 (90 Base) MCG/ACT inhaler Inhale 1-2 puffs into the lungs every 6 (six) hours as needed for wheezing or shortness of breath.    [provider]  albuterol (PROVENTIL) (2.5 MG/3ML) 0.083% nebulizer solution Take 3 mLs (2.5 mg total) by nebulization every 6 (six) hours as needed for wheezing or shortness of breath. 03/31/17   Carollee Herter, Alferd Apa, DO  allopurinol (ZYLOPRIM) 100 MG tablet  06/10/21   [provider]  blood  glucose meter kit and supplies KIT Dispense based on patient and insurance preference. Use up to four times daily as directed. (FOR ICD-9 250.00, 250.01). 12/08/20   Carollee Herter, Alferd Apa, DO  busPIRone (BUSPAR) 15 MG tablet TAKE 1 TABLET BY MOUTH TWICE A DAY 07/01/22   White, Brian A, NP  Dulaglutide (TRULICITY) 1.5 YQ/0.3KV SOPN Inject 1.5 mg into the skin once a week. 04/29/22   Shamleffer, Melanie Crazier, MD  famotidine (PEPCID) 20 MG tablet Take 20 mg by mouth daily as needed for heartburn or indigestion.    [provider]  gabapentin (NEURONTIN) 100 MG capsule 1-2 po qhs Patient not taking: Reported on 06/09/2022 03/14/22   Roma Schanz R, DO  glucose blood (ACCU-CHEK GUIDE) test strip USE 1 STRIP TO CHECK 4 TIMES DAILY 08/15/22   Ann Held, DO  hydrALAZINE (APRESOLINE) 25 MG tablet Take 0.5 tablets (12.5 mg total) by mouth 3 (three) times daily. Patient not taking: Reported on 06/09/2022 05/18/21 08/16/21  Tobb, Godfrey Pick, DO  insulin glargine, 1 Unit Dial, (TOUJEO SOLOSTAR) 300 UNIT/ML Solostar Pen Inject 64 Units into the skin daily in the afternoon. 04/29/22   Shamleffer, Melanie Crazier, MD  Insulin Pen Needle 29G X 8MM MISC 1 Device by Does not apply route daily in the afternoon. 04/29/22   Shamleffer, Melanie Crazier, MD  levothyroxine (SYNTHROID) 88 MCG tablet Take 1 tablet (88 mcg total) by mouth daily. 04/29/22   Shamleffer, Melanie Crazier,  MD  LORazepam (ATIVAN) 1 MG tablet TAKE 1/2 TO 1 AND 1/2 TABLETS AT BEDTIME AND AN ADDITIONAL 1/2 TAB ONCE A DAY AS NEEDED FOR AXIETY 06/03/22   Elwanda Brooklyn, NP  Magnesium 400 MG CAPS Take 1 capsule by mouth daily. 01/14/20   Mosie Lukes, MD  metoprolol succinate (TOPROL-XL) 50 MG 24 hr tablet Take 2 tablets (100 mg total) by mouth daily. Take with or immediately following a meal 08/30/22   Carollee Herter, Alferd Apa, DO  NONFORMULARY OR COMPOUNDED ITEM Pt/ inr   Dx dvt   Tomorrow 04/05/2019  Please call office [phone] with results  04/04/19   Carollee Herter, Alferd Apa, DO  NONFORMULARY OR COMPOUNDED ITEM PT/ inr    Dx dvt 04/08/19   Carollee Herter, Alferd Apa, DO  NONFORMULARY OR COMPOUNDED ITEM Compression stockings  20-30 mm/hg  #1   Dx low ext edema 05/28/19   Carollee Herter, Alferd Apa, DO  olmesartan (BENICAR) 20 MG tablet Take 1 tablet (20 mg total) by mouth daily. Patient not taking: Reported on 06/09/2022 02/19/21   Tanda Rockers, MD  OXYGEN Inhale 3-4 L/min into the lungs continuous.     [provider]  potassium chloride SA (KLOR-CON M) 20 MEQ tablet Take 1 tablet by mouth once daily 06/27/22   Carollee Herter, Alferd Apa, DO  QUEtiapine (SEROQUEL) 200 MG tablet Take 1 tablet (200 mg total) by mouth at bedtime. 06/03/22   Elwanda Brooklyn, NP  rosuvastatin (CRESTOR) 10 MG tablet Take 1 tablet (10 mg total) by mouth daily. For heart health and to lower cholesterol. 08/23/22   Ann Held, DO  torsemide (DEMADEX) 20 MG tablet Take 3 tablets (60 mg total) by mouth daily. 03/14/22   Ann Held, DO  zolpidem (AMBIEN CR) 12.5 MG CR tablet TAKE 1 TABLET BY MOUTH EVERY DAY AT BEDTIME AS NEEDED FOR SLEEP 06/07/22   Elwanda Brooklyn, NP      Allergies    Hydrocodone, Norvasc [amlodipine besylate], and Tizanidine    Review of Systems   Review of Systems  Physical Exam Updated Vital Signs BP 124/73   Pulse 78   Temp (!) 96.1 F (35.6 C) (Axillary)   Resp 19   Wt 128 kg   SpO2 100%   BMI 49.99 kg/m  Physical Exam Constitutional:      Appearance: She is obese.  HENT:     Head: Normocephalic and atraumatic.  Eyes:     Extraocular Movements: Extraocular movements intact.     Pupils: Pupils are equal, round, and reactive to light.  Cardiovascular:     Rate and Rhythm: Normal rate and regular rhythm.  Pulmonary:     Effort: Pulmonary effort is normal. No respiratory distress.     Breath sounds: Normal breath sounds.  Abdominal:     General: Bowel sounds are normal.     Palpations: Abdomen is soft.      Tenderness: There is no abdominal tenderness.     Comments: Breathing comfortably on NRB  Musculoskeletal:     Cervical back: Neck supple.     Right lower leg: No edema.     Left lower leg: No edema.  Skin:    General: Skin is warm and dry.  Neurological:     Comments: Somnolent, awakens to verbal stimuli.  Oriented to self and location.  Stated year was "69".  Able to follow commands.     ED Results / Procedures / Treatments  Labs (all labs ordered are listed, but only abnormal results are displayed) Labs Reviewed  RESP PANEL BY RT-PCR (RSV, FLU A&B, COVID)  [id number]  CULTURE, BLOOD (ROUTINE X 2)  CULTURE, BLOOD (ROUTINE X 2)  URINE CULTURE  LACTIC ACID, PLASMA  LACTIC ACID, PLASMA  COMPREHENSIVE METABOLIC PANEL  CBC WITH DIFFERENTIAL/PLATELET  PROTIME-INR  APTT  URINALYSIS, ROUTINE W REFLEX MICROSCOPIC  BRAIN NATRIURETIC PEPTIDE  I-STAT CHEM 8, ED    EKG EKG Interpretation  Date/Time:  Thursday September 01 2022 19:05:28 EST Ventricular Rate:  79 PR Interval:  201 QRS Duration: 111 QT Interval:  439 QTC Calculation: 504 R Axis:   21 Text Interpretation: Sinus rhythm Ventricular premature complex Abnormal inferior Q waves Borderline T wave abnormalities No significant change since last tracing Confirmed by Wandra Arthurs [phone]) on 09/01/2022 7:19:47 PM  Radiology No results found.  Procedures Procedures    Medications Ordered in ED Medications  ceFEPIme (MAXIPIME) 2 g in sodium chloride 0.9 % 100 mL IVPB (has no administration in time range)  metroNIDAZOLE (FLAGYL) IVPB 500 mg (has no administration in time range)  lactated ringers bolus 1,000 mL (has no administration in time range)  vancomycin (VANCOCIN) 2,250 mg in sodium chloride 0.9 % 500 mL IVPB (has no administration in time range)    ED Course/ Medical Decision Making/ A&P                           Medical Decision Making Amount and/or Complexity of Data Reviewed Labs: ordered. Radiology:  ordered.  Risk Prescription drug management.  71 year old female with history outlined above presenting with altered mental status found unresponsive at home and hypotensive in the setting of 2 days of general malaise and lethargy.  On presentation, she is hypothermic to 96.1 F.  Very somnolent but responding to verbal stimuli and is able to follow commands.  Mental status could be related to medications but given hypothermia and hypotension concern for infectious source.  Sepsis workup initiated with labs and CXR.  Given mental status will check CT head.  Will give fluid bolus and initiate broad-spectrum antibiotics.  Labs notable for significant elevation in creatinine 2.4 (baseline around 1.4).  CXR without obvious evidence of pneumonia.  UA noninfectious.  CT head unremarkable.  At this point, there is no clear infectious source to explain her hypotension and hypothermia.  On reassessment she is more alert and interactive compared to prior.  There could be some element of overdose of her psychotropic medication, UDS has been ordered.  Also adding on thyroid studies given her history of hypothyroidism.  She will need admission for altered mental status and possible sepsis as well as AKI.  Patient signed out to attending Dr. Bayard Hugger.  Final Clinical Impression(s) / ED Diagnoses Final diagnoses:  None    Rx / DC Orders ED Discharge Orders     None         Zola Button, MD 09/01/22 2213    Drenda Freeze, MD 09/01/22 [phone]

## 2022-09-01 NOTE — Sepsis Progress Note (Signed)
Elink monitoring for the code sepsis protocol.  

## 2022-09-01 NOTE — Progress Notes (Signed)
Pharmacy Antibiotic Note  Sue [name] is a 71 y.o. female admitted on 09/01/2022 with sepsis.  Pharmacy has been consulted for cefepime and vancomycin dosing. Patient with significant AKI, Scr: 2.40, baseline appears to be ~1.3.   Plan: Start cefepime 2g Q24H.  Vancomycin 2250mg  load x 1 then dose based on AKI trend.  Follow culture data for de-escalation.  Monitor renal function for dose adjustments as indicated.   Weight: 128 kg (282 lb 3 oz)  Temp (24hrs), Avg:95.9 F (35.5 C), Min:95.6 F (35.3 C), Max:96.1 F (35.6 C)  Recent Labs  Lab 09/01/22 2008 09/01/22 2043  WBC 5.2  --   CREATININE  --  2.40*    Estimated Creatinine Clearance: 28.4 mL/min (A) (by C-G formula based on SCr of 2.4 mg/dL (H)).    Allergies  Allergen Reactions   Hydrocodone Other (See Comments)    Constipation and hallucinations   Norvasc [Amlodipine Besylate] Swelling and Other (See Comments)    Marked swelling of the limbs   Tizanidine Other (See Comments)    After the 3rd dose, the patient's mouth began to feel numb and she felt like she was a having a "hot flash"    Thank you for allowing pharmacy to be a part of this patient's care.  Esmeralda Arthur, PharmD, BCCCP  09/01/2022 8:48 PM

## 2022-09-01 NOTE — ED Notes (Signed)
Patient transported to CT 

## 2022-09-02 ENCOUNTER — Observation Stay (HOSPITAL_COMMUNITY): Payer: Medicare HMO

## 2022-09-02 DIAGNOSIS — I10 Essential (primary) hypertension: Secondary | ICD-10-CM

## 2022-09-02 DIAGNOSIS — Z87891 Personal history of nicotine dependence: Secondary | ICD-10-CM | POA: Diagnosis not present

## 2022-09-02 DIAGNOSIS — E876 Hypokalemia: Secondary | ICD-10-CM

## 2022-09-02 DIAGNOSIS — E1122 Type 2 diabetes mellitus with diabetic chronic kidney disease: Secondary | ICD-10-CM | POA: Diagnosis present

## 2022-09-02 DIAGNOSIS — E039 Hypothyroidism, unspecified: Secondary | ICD-10-CM | POA: Diagnosis present

## 2022-09-02 DIAGNOSIS — R680 Hypothermia, not associated with low environmental temperature: Secondary | ICD-10-CM | POA: Diagnosis present

## 2022-09-02 DIAGNOSIS — J439 Emphysema, unspecified: Secondary | ICD-10-CM | POA: Diagnosis present

## 2022-09-02 DIAGNOSIS — I5032 Chronic diastolic (congestive) heart failure: Secondary | ICD-10-CM | POA: Diagnosis present

## 2022-09-02 DIAGNOSIS — Z794 Long term (current) use of insulin: Secondary | ICD-10-CM | POA: Diagnosis not present

## 2022-09-02 DIAGNOSIS — F419 Anxiety disorder, unspecified: Secondary | ICD-10-CM | POA: Diagnosis present

## 2022-09-02 DIAGNOSIS — Z6841 Body Mass Index (BMI) 40.0 and over, adult: Secondary | ICD-10-CM | POA: Diagnosis not present

## 2022-09-02 DIAGNOSIS — Z825 Family history of asthma and other chronic lower respiratory diseases: Secondary | ICD-10-CM | POA: Diagnosis not present

## 2022-09-02 DIAGNOSIS — R0609 Other forms of dyspnea: Secondary | ICD-10-CM | POA: Diagnosis not present

## 2022-09-02 DIAGNOSIS — N183 Chronic kidney disease, stage 3 unspecified: Secondary | ICD-10-CM | POA: Diagnosis present

## 2022-09-02 DIAGNOSIS — F32A Depression, unspecified: Secondary | ICD-10-CM | POA: Diagnosis present

## 2022-09-02 DIAGNOSIS — I959 Hypotension, unspecified: Secondary | ICD-10-CM | POA: Diagnosis present

## 2022-09-02 DIAGNOSIS — Z20822 Contact with and (suspected) exposure to covid-19: Secondary | ICD-10-CM | POA: Diagnosis present

## 2022-09-02 DIAGNOSIS — K219 Gastro-esophageal reflux disease without esophagitis: Secondary | ICD-10-CM | POA: Diagnosis present

## 2022-09-02 DIAGNOSIS — Z8249 Family history of ischemic heart disease and other diseases of the circulatory system: Secondary | ICD-10-CM | POA: Diagnosis not present

## 2022-09-02 DIAGNOSIS — N179 Acute kidney failure, unspecified: Secondary | ICD-10-CM

## 2022-09-02 DIAGNOSIS — G9340 Encephalopathy, unspecified: Secondary | ICD-10-CM | POA: Diagnosis present

## 2022-09-02 DIAGNOSIS — T50901A Poisoning by unspecified drugs, medicaments and biological substances, accidental (unintentional), initial encounter: Secondary | ICD-10-CM | POA: Diagnosis present

## 2022-09-02 DIAGNOSIS — D649 Anemia, unspecified: Secondary | ICD-10-CM | POA: Diagnosis present

## 2022-09-02 DIAGNOSIS — R81 Glycosuria: Secondary | ICD-10-CM | POA: Diagnosis present

## 2022-09-02 DIAGNOSIS — F418 Other specified anxiety disorders: Secondary | ICD-10-CM | POA: Diagnosis not present

## 2022-09-02 DIAGNOSIS — I130 Hypertensive heart and chronic kidney disease with heart failure and stage 1 through stage 4 chronic kidney disease, or unspecified chronic kidney disease: Secondary | ICD-10-CM | POA: Diagnosis present

## 2022-09-02 LAB — CBC
HCT: 32.2 % — ABNORMAL LOW (ref 36.0–46.0)
Hemoglobin: 10.5 g/dL — ABNORMAL LOW (ref 12.0–15.0)
MCH: 31.7 pg (ref 26.0–34.0)
MCHC: 32.6 g/dL (ref 30.0–36.0)
MCV: 97.3 fL (ref 80.0–100.0)
Platelets: 143 10*3/uL — ABNORMAL LOW (ref 150–400)
RBC: 3.31 MIL/uL — ABNORMAL LOW (ref 3.87–5.11)
RDW: 13.2 % (ref 11.5–15.5)
WBC: 5.4 10*3/uL (ref 4.0–10.5)
nRBC: 0 % (ref 0.0–0.2)

## 2022-09-02 LAB — ECHOCARDIOGRAM COMPLETE
S' Lateral: 2.8 cm
Weight: 4515.02 oz

## 2022-09-02 LAB — BASIC METABOLIC PANEL
Anion gap: 11 (ref 5–15)
BUN: 24 mg/dL — ABNORMAL HIGH (ref 8–23)
CO2: 27 mmol/L (ref 22–32)
Calcium: 8.7 mg/dL — ABNORMAL LOW (ref 8.9–10.3)
Chloride: 98 mmol/L (ref 98–111)
Creatinine, Ser: 2.04 mg/dL — ABNORMAL HIGH (ref 0.44–1.00)
GFR, Estimated: 26 mL/min — ABNORMAL LOW (ref >60)
Glucose, Bld: 199 mg/dL — ABNORMAL HIGH (ref 70–99)
Potassium: 4.7 mmol/L (ref 3.5–5.1)
Sodium: 136 mmol/L (ref 135–145)

## 2022-09-02 LAB — I-STAT VENOUS BLOOD GAS, ED
Acid-Base Excess: 6 mmol/L — ABNORMAL HIGH (ref 0.0–2.0)
Bicarbonate: 26.5 mmol/L (ref 20.0–28.0)
Calcium, Ion: 0.9 mmol/L — ABNORMAL LOW (ref 1.15–1.40)
HCT: 31 % — ABNORMAL LOW (ref 36.0–46.0)
Hemoglobin: 10.5 g/dL — ABNORMAL LOW (ref 12.0–15.0)
O2 Saturation: 100 %
Potassium: 3 mmol/L — ABNORMAL LOW (ref 3.5–5.1)
Sodium: 137 mmol/L (ref 135–145)
TCO2: 27 mmol/L (ref 22–32)
pCO2, Ven: 25.7 mmHg — ABNORMAL LOW (ref 44–60)
pH, Ven: 7.621 (ref 7.25–7.43)
pO2, Ven: 177 mmHg — ABNORMAL HIGH (ref 32–45)

## 2022-09-02 LAB — T4, FREE: Free T4: 0.69 ng/dL (ref 0.61–1.12)

## 2022-09-02 LAB — TROPONIN I (HIGH SENSITIVITY)
Troponin I (High Sensitivity): 7 ng/L (ref <18)
Troponin I (High Sensitivity): 7 ng/L (ref <18)

## 2022-09-02 LAB — D-DIMER, QUANTITATIVE: D-Dimer, Quant: 0.65 ug/mL-FEU — ABNORMAL HIGH (ref 0.00–0.50)

## 2022-09-02 LAB — CBG MONITORING, ED
Glucose-Capillary: 192 mg/dL — ABNORMAL HIGH (ref 70–99)
Glucose-Capillary: 318 mg/dL — ABNORMAL HIGH (ref 70–99)
Glucose-Capillary: 302 mg/dL — ABNORMAL HIGH (ref 70–99)

## 2022-09-02 LAB — CREATININE, SERUM
Creatinine, Ser: 2.02 mg/dL — ABNORMAL HIGH (ref 0.44–1.00)
GFR, Estimated: 26 mL/min — ABNORMAL LOW (ref >60)

## 2022-09-02 LAB — HIV ANTIBODY (ROUTINE TESTING W REFLEX): HIV Screen 4th Generation wRfx: NONREACTIVE

## 2022-09-02 LAB — CORTISOL: Cortisol, Plasma: 6 ug/dL

## 2022-09-02 LAB — PROCALCITONIN: Procalcitonin: <0.1 ng/mL

## 2022-09-02 LAB — TSH: TSH: 5.455 u[IU]/mL — ABNORMAL HIGH (ref 0.350–4.500)

## 2022-09-02 MED ORDER — LACTATED RINGERS IV SOLN: INTRAVENOUS | Status: DC

## 2022-09-02 MED ORDER — ENOXAPARIN SODIUM 30 MG/0.3ML IJ SOSY
30.0000 mg | PREFILLED_SYRINGE | INTRAMUSCULAR | Status: DC
  Administered 2022-09-02: 30 mg via SUBCUTANEOUS
  Filled 2022-09-02: qty 0.3

## 2022-09-02 MED ORDER — IBUPROFEN 400 MG PO TABS: 600.0000 mg | ORAL_TABLET | Freq: Four times a day (QID) | ORAL | Status: DC | PRN

## 2022-09-02 MED ORDER — ONDANSETRON HCL 4 MG/2ML IJ SOLN: 4.0000 mg | Freq: Four times a day (QID) | INTRAMUSCULAR | Status: DC | PRN

## 2022-09-02 MED ORDER — INSULIN ASPART 100 UNIT/ML IJ SOLN
0.0000 [IU] | INTRAMUSCULAR | Status: DC
  Administered 2022-09-02: 11 [IU] via SUBCUTANEOUS
  Administered 2022-09-02: 3 [IU] via SUBCUTANEOUS
  Administered 2022-09-02: 11 [IU] via SUBCUTANEOUS

## 2022-09-02 MED ORDER — ACETAMINOPHEN 325 MG PO TABS: 650.0000 mg | ORAL_TABLET | Freq: Four times a day (QID) | ORAL | Status: DC | PRN

## 2022-09-02 MED ORDER — POTASSIUM CHLORIDE 10 MEQ/100ML IV SOLN
10.0000 meq | INTRAVENOUS | Status: AC
  Administered 2022-09-02: 10 meq via INTRAVENOUS
  Filled 2022-09-02 (×2): qty 100

## 2022-09-02 MED ORDER — ACETAMINOPHEN 650 MG RE SUPP: 650.0000 mg | Freq: Four times a day (QID) | RECTAL | Status: DC | PRN

## 2022-09-02 MED ORDER — ONDANSETRON HCL 4 MG PO TABS: 4.0000 mg | ORAL_TABLET | Freq: Four times a day (QID) | ORAL | Status: DC | PRN

## 2022-09-02 NOTE — ED Notes (Signed)
Pt taken to front via wheelchair and oxygen.  Pt states her daughter is on her way.

## 2022-09-02 NOTE — ED Notes (Signed)
Patient is more alert, talkative, able to answer LOC questions. Denies any pain or needs at this time.

## 2022-09-02 NOTE — ED Notes (Signed)
Pt provided with 3 warm blankets per request.

## 2022-09-02 NOTE — Assessment & Plan Note (Addendum)
Pt with AKI, creat of 2.4 today up from ~1.4-1.6 baseline. ? Pre-renal ATN given initial BP 80/50 with EMS. Got IVF bolus in ED with improvement in BP, continue IVF overnight Repeat BMP in AM Strict intake and output Hold diuretics and BP meds UA not very impressive (glucose in urine and not much else) Obtain renal US if not rapidly improving with supportive care

## 2022-09-02 NOTE — Assessment & Plan Note (Signed)
Pt on numerous psychotropic meds. Question if accidental OD may be to blame for tonight's presentation, especially given family statements above. Med rec still pending anyhow Holding psychotropic meds for tonight at least.

## 2022-09-02 NOTE — Sepsis Progress Note (Signed)
Notified bedside nurse of need to order repeat lactic acid due to 2nd trending up .

## 2022-09-02 NOTE — Assessment & Plan Note (Signed)
Resume home synthroid when med rec completed. Myxedema very unlikely with TSH only slightly elevated at 5.4 and T4 technically in normal range at 0.69.

## 2022-09-02 NOTE — H&P (Signed)
History and Physical    Patient: Sue [name] CBJ:[id number] DOB: [birth year] DOA: 09/01/2022 DOS: the patient was seen and examined on 09/02/2022 PCP: Ann Held, DO  Patient coming from: Home  Chief Complaint:  Chief Complaint  Patient presents with   Altered Mental Status    Per ems pt c/o malaise, bodyaches x 3 days to family. During well check today, family found pt unresponsive. BP 80/50 on arrival    HPI: Sue [name] is a 71 y.o. female with medical history significant of hypothyroidism, depression, DM2, emphysema with 3L O2 requirement.  Pt with increased lethargy for past 2-3 days per EDP discussion with daughter.  Generalized malaise, chills, and body aches.  Daughter went to check on her earlier today and found pt to be unresponsive.  EMS called.  EMS found BP 80/50 on arrival, got 800cc bolus en-route to ED followed by another 1L in ED.  Pt with improved mentation at this time, answering questions though still somnolent.  No urinary nor respiratory symptoms per patient.  Per EDP note: Daughter notes that she is on multiple psychiatric medications and she tends to take more than prescribed, so she thought lethargy was possibly due to her medications.  Patient states she only takes meds as prescribed.  Son who is at bedside at time of my exam clarifies that patient is 71 years old, and might sometimes forget she took her meds so accidentally take an extra dose perhaps.   Review of Systems: As mentioned in the history of present illness. All other systems reviewed and are negative. Past Medical History:  Diagnosis Date   Allergic rhinitis    Depression    Diabetes mellitus type 2, uncontrolled    Emphysema of lung (HCC)    3L home O2   GERD (gastroesophageal reflux disease)    Hypertension    Hypothyroidism    Obesity, morbid, BMI 50 or higher ([place])    Stroke ([place]) 2016   TIA    Urine incontinence    Past Surgical History:  Procedure Laterality  Date   ABDOMINAL HYSTERECTOMY     CESAREAN SECTION     COLONOSCOPY N/A 08/19/2019   Procedure: COLONOSCOPY;  Surgeon: Clarene Essex, MD;  Location: [facility];  Service: Endoscopy;  Laterality: N/A;   COLONOSCOPY WITH PROPOFOL N/A 08/05/2019   Procedure: COLONOSCOPY WITH PROPOFOL;  Surgeon: Wilford Corner, MD;  Location: [facility];  Service: Gastroenterology;  Laterality: N/A;   COLONOSCOPY WITH PROPOFOL N/A 11/19/2019   Procedure: COLONOSCOPY WITH PROPOFOL;  Surgeon: Ronald Lobo, MD;  Location: [facility];  Service: Endoscopy;  Laterality: N/A;  Unprepped   COLONOSCOPY WITH PROPOFOL N/A 11/22/2019   Procedure: COLONOSCOPY WITH PROPOFOL;  Surgeon: Ronald Lobo, MD;  Location: [facility];  Service: Endoscopy;  Laterality: N/A;   COLONOSCOPY WITH PROPOFOL N/A 11/23/2019   Procedure: COLONOSCOPY WITH PROPOFOL;  Surgeon: [name], MD;  Location: [facility];  Service: Gastroenterology;  Laterality: N/A;   COLONOSCOPY WITH PROPOFOL N/A 11/29/2019   Procedure: COLONOSCOPY WITH PROPOFOL;  Surgeon: Wilford Corner, MD;  Location: [facility];  Service: Endoscopy;  Laterality: N/A;   ENTEROSCOPY N/A 11/24/2019   Procedure: ENTEROSCOPY;  Surgeon: [name], MD;  Location: [facility];  Service: Gastroenterology;  Laterality: N/A;   ENTEROSCOPY N/A 11/27/2019   Procedure: ENTEROSCOPY;  Surgeon: Wilford Corner, MD;  Location: [facility];  Service: Endoscopy;  Laterality: N/A;   ESOPHAGOGASTRODUODENOSCOPY N/A 11/27/2019   Procedure: ESOPHAGOGASTRODUODENOSCOPY (EGD);  Surgeon: Wilford Corner, MD;  Location:  [facility];  Service: Endoscopy;  Laterality: N/A;   ESOPHAGOGASTRODUODENOSCOPY (EGD) WITH PROPOFOL N/A 11/24/2019   Procedure: ESOPHAGOGASTRODUODENOSCOPY (EGD) WITH PROPOFOL;  Surgeon: [name], MD;  [facility];  Service: Gastroenterology;  Laterality: N/A;  PUSH enteroscopy   GIVENS CAPSULE STUDY N/A 11/19/2019   Procedure: GIVENS CAPSULE STUDY;  Surgeon:  Ronald Lobo, MD;  Location: [facility];  Service: Endoscopy;  Laterality: N/A;  To be performed immediately following colonoscopy   GIVENS CAPSULE STUDY N/A 11/24/2019   Procedure: GIVENS CAPSULE STUDY;  Surgeon: [name], MD;  [facility]: [facility];  Service: Gastroenterology;  Laterality: N/A;   GIVENS CAPSULE STUDY N/A 11/28/2019   Procedure: GIVENS CAPSULE STUDY;  Surgeon: Wilford Corner, MD;  [facility];  Service: Endoscopy;  Laterality: N/A;   HEMOSTASIS CLIP PLACEMENT  11/19/2019   Procedure: HEMOSTASIS CLIP PLACEMENT;  Surgeon: Ronald Lobo, MD;  Location: [facility];  Service: Endoscopy;;   HEMOSTASIS CLIP PLACEMENT  11/22/2019   Procedure: HEMOSTASIS CLIP PLACEMENT;  Surgeon: Ronald Lobo, MD;  Location: [facility];  Service: Endoscopy;;   HOT HEMOSTASIS N/A 11/24/2019   Procedure: HOT HEMOSTASIS (ARGON PLASMA COAGULATION/BICAP);  Surgeon: [name], MD;  Location: [facility];  Service: Gastroenterology;  Laterality: N/A;   HOT HEMOSTASIS N/A 11/27/2019   Procedure: HOT HEMOSTASIS (ARGON PLASMA COAGULATION/BICAP);  Surgeon: Wilford Corner, MD;  Location: [facility];  Service: Endoscopy;  Laterality: N/A;   SUBMUCOSAL TATTOO INJECTION  11/19/2019   Procedure: SUBMUCOSAL TATTOO INJECTION;  Surgeon: Ronald Lobo, MD;  Location: [facility];  Service: Endoscopy;;   Social History:  reports that she quit smoking about 10 years ago. Her smoking use included cigarettes. She started smoking about 50 years ago. She has a 40.00 pack-year smoking history. She has never used smokeless tobacco. She reports that she does not currently use alcohol. She reports that she does not use drugs.  Allergies  Allergen Reactions   Hydrocodone Other (See Comments)    Constipation and hallucinations   Norvasc [Amlodipine Besylate] Swelling and Other (See Comments)    Marked swelling of the limbs   Tizanidine Other (See Comments)    After the 3rd dose, the patient's  mouth began to feel numb and she felt like she was a having a "hot flash"    Family History  Problem Relation Age of Onset   Heart disease Father        MVP and Pics Valve   Hypertension Father    Depression Father        Institutionalized x's 2 years   Bipolar disorder Father    Hypertension Sister    Diabetes Sister    Hyperlipidemia Sister    Heart disease Sister 19       MI   Heart disease Brother    Hypertension Brother    Schizophrenia Paternal Aunt    Depression Paternal Aunt    Anxiety disorder Paternal Aunt    Heart disease Paternal Aunt    Schizophrenia Paternal Aunt    Heart disease Paternal Uncle    Heart disease Paternal 56    Asthma Son    Asthma Son     Prior to Admission medications   Medication Sig Start Date End Date Taking? Authorizing Provider  allopurinol (ZYLOPRIM) 100 MG tablet Take 100 mg by mouth daily as needed (for gout flareups). 06/10/21  Yes [provider]  busPIRone (BUSPAR) 15 MG tablet TAKE 1 TABLET BY MOUTH TWICE A DAY 07/01/22  Yes Elwanda Brooklyn, NP  Dulaglutide (TRULICITY) 1.5 HY/0.7PX SOPN Inject 1.5 mg into the skin once a week. 04/29/22  Yes Shamleffer, Melanie Crazier, MD  famotidine (PEPCID) 20 MG tablet Take 20 mg by mouth daily as needed for heartburn or indigestion.   Yes [provider]  gabapentin (NEURONTIN) 100 MG capsule 1-2 po qhs Patient taking differently: Take 100 mg by mouth at bedtime. 03/14/22  Yes Roma Schanz R, DO  ibuprofen (ADVIL) 800 MG tablet Take 800 mg by mouth every 8 (eight) hours as needed for headache. 07/28/22  Yes [provider]  insulin glargine, 1 Unit Dial, (TOUJEO SOLOSTAR) 300 UNIT/ML Solostar Pen Inject 64 Units into the skin daily in the afternoon. 04/29/22  Yes Shamleffer, Melanie Crazier, MD  levothyroxine (SYNTHROID) 88 MCG tablet Take 1 tablet (88 mcg total) by mouth daily. 04/29/22  Yes Shamleffer, Melanie Crazier, MD  LORazepam (ATIVAN) 1 MG tablet TAKE  1/2 TO 1 AND 1/2 TABLETS AT BEDTIME AND AN ADDITIONAL 1/2 TAB ONCE A DAY AS NEEDED FOR AXIETY 06/03/22  Yes White, Brian A, NP  Magnesium 400 MG CAPS Take 1 capsule by mouth daily. 01/14/20  Yes Mosie Lukes, MD  metoprolol succinate (TOPROL-XL) 50 MG 24 hr tablet Take 2 tablets (100 mg total) by mouth daily. Take with or immediately following a meal 08/30/22  Yes Lowne Lyndal Pulley R, DO  OXYGEN Inhale 3-4 L/min into the lungs continuous.    Yes [provider]  potassium chloride SA (KLOR-CON M) 20 MEQ tablet Take 1 tablet by mouth once daily 06/27/22  Yes Lowne Chase, Yvonne R, DO  QUEtiapine (SEROQUEL) 200 MG tablet Take 1 tablet (200 mg total) by mouth at bedtime. 06/03/22  Yes Lesle Chris A, NP  rosuvastatin (CRESTOR) 10 MG tablet Take 1 tablet (10 mg total) by mouth daily. For heart health and to lower cholesterol. 08/23/22  Yes Roma Schanz R, DO  torsemide (DEMADEX) 20 MG tablet Take 3 tablets (60 mg total) by mouth daily. 03/14/22  Yes Roma Schanz R, DO  zolpidem (AMBIEN CR) 12.5 MG CR tablet TAKE 1 TABLET BY MOUTH EVERY DAY AT BEDTIME AS NEEDED FOR SLEEP Patient taking differently: Take 12.5 mg by mouth at bedtime as needed for sleep. 06/07/22  Yes White, Louanna Raw, NP  Accu-Chek Softclix Lancets lancets USE 1  TO CHECK GLUCOSE 4 TIMES DAILY 06/27/22   Carollee Herter, Kendrick Fries R, DO  blood glucose meter kit and supplies KIT Dispense based on patient and insurance preference. Use up to four times daily as directed. (FOR ICD-9 250.00, 250.01). 12/08/20   Carollee Herter, Kendrick Fries R, DO  glucose blood (ACCU-CHEK GUIDE) test strip USE 1 STRIP TO CHECK 4 TIMES DAILY 08/15/22   Carollee Herter, Alferd Apa, DO  Insulin Pen Needle 29G X 8MM MISC 1 Device by Does not apply route daily in the afternoon. 04/29/22   Shamleffer, Melanie Crazier, MD    Physical Exam: Vitals:   09/01/22 2300 09/01/22 2315 09/01/22 2330 09/02/22 0000  BP: 122/81 117/70 124/75 118/76  Pulse: 82 82 82 85  Resp: _0 Temp:      TempSrc:      SpO2: 95% 95% 96% 95%  Weight:       Constitutional: NAD, calm, comfortable Eyes: PERRL, lids and conjunctivae normal ENMT: Mucous membranes are moist. Posterior pharynx clear of any exudate or lesions.Normal dentition.  Neck: normal, supple, no masses, no thyromegaly Respiratory: clear to auscultation bilaterally, no wheezing, no crackles.  Normal respiratory effort. No accessory muscle use.  Cardiovascular: Regular rate and rhythm, no murmurs / rubs / gallops. No extremity edema. 2+ pedal pulses. No carotid bruits.  Abdomen: no tenderness, no masses palpated. No hepatosplenomegaly. Bowel sounds positive.  Musculoskeletal: no clubbing / cyanosis. No joint deformity upper and lower extremities. Good ROM, no contractures. Normal muscle tone.  Skin: no rashes, lesions, ulcers. No induration Neurologic: CN 2-12 grossly intact. Sensation intact, DTR normal. Strength 5/5 in all 4.  Psychiatric: Somnolent, but wakes up to verbal stimuli, answers questions appropriately, oriented to self, location.  Following commands  Data Reviewed:    CT head = no acute findings CXR neg  Urinalysis    Component Value Date/Time   COLORURINE YELLOW 09/01/2022 2121   APPEARANCEUR CLEAR 09/01/2022 2121   LABSPEC 1.016 09/01/2022 2121   PHURINE 5.0 09/01/2022 2121   GLUCOSEU >=500 (A) 09/01/2022 2121   GLUCOSEU NEGATIVE 11/11/2013 1353   HGBUR NEGATIVE 09/01/2022 2121   BILIRUBINUR NEGATIVE 09/01/2022 2121   BILIRUBINUR neg 10/27/2015 1355   KETONESUR NEGATIVE 09/01/2022 2121   PROTEINUR NEGATIVE 09/01/2022 2121   UROBILINOGEN 0.2 10/27/2015 1355   UROBILINOGEN 0.2 07/29/2014 2300   NITRITE NEGATIVE 09/01/2022 2121   LEUKOCYTESUR NEGATIVE 09/01/2022 2121       Latest Ref Rng & Units 09/01/2022    8:43 PM 09/01/2022    8:08 PM 03/14/2022   11:56 AM  CBC  WBC 4.0 - 10.5 K/uL  5.2  6.8   Hemoglobin 12.0 - 15.0 g/dL 12.6  11.7  12.5   Hematocrit 36.0 - 46.0 % 37.0   35.4  38.7   Platelets 150 - 400 K/uL  159  201.0       Latest Ref Rng & Units 09/01/2022    8:43 PM 09/01/2022    8:08 PM 03/14/2022   11:56 AM  CMP  Glucose 70 - 99 mg/dL 295  288  201   BUN 8 - 23 mg/dL 35  28  17   Creatinine 0.44 - 1.00 mg/dL 2.40  2.44  1.38   Sodium 135 - 145 mmol/L 138  136  139   Potassium 3.5 - 5.1 mmol/L 2.9  2.9  3.6   Chloride 98 - 111 mmol/L 94  94  97   CO2 22 - 32 mmol/L  30  32   Calcium 8.9 - 10.3 mg/dL  9.3  9.9   Total Protein 6.5 - 8.1 g/dL  7.7  8.4   Total Bilirubin 0.3 - 1.2 mg/dL  0.6  0.4   Alkaline Phos 38 - 126 U/L  114  110   AST 15 - 41 U/L  33  26   ALT 0 - 44 U/L  19  17    Drugs of Abuse     Component Value Date/Time   LABOPIA NONE DETECTED 09/01/2022 2121   COCAINSCRNUR NONE DETECTED 09/01/2022 2121   LABBENZ POSITIVE (A) 09/01/2022 2121   AMPHETMU NONE DETECTED 09/01/2022 2121   THCU NONE DETECTED 09/01/2022 2121   LABBARB NONE DETECTED 09/01/2022 2121      Assessment and Plan: * Acute encephalopathy Lethargy, initial hypothermia.  Now improving. DDx is broad and includes: 1) sepsis 2) polypharmacy / accidental OD  Normal or unimpressive labs on work up thus far: UA only shows glucosuria BGL normal on presentation (not hypoglycemia causing her presentation today) CBC shows mild anemia with HGB of 11, no WBC COVID, FLU, and RSV all negative. CT head neg LFTs and  ammonia normal TSH only slightly elevated with T4 in normal range CXR  Positive labs of possible significance thus far: UDS positive for benzos (is prescribed these) CMP shows AKI  Plan: Got sepsis ABx in ED But no suspected source of infection at this point, and only 1/4 SIRS criteria on presentation (hypothermia).  Sepsis seems a bit less likely. Check procalcitonin UCx and BCx pending Given family statements, accidental OD of psych meds seems like a strong possibility Would also explain why she is slowly improving with essentially just supportive  care thus far Will hold off on ordering any of her home psych meds for the moment.   AKI (acute kidney injury) ([place]) Pt with AKI, creat of 2.4 today up from ~1.4-1.6 baseline. ? Pre-renal ATN given initial BP 80/50 with EMS. Got IVF bolus in ED with improvement in BP, continue IVF overnight Repeat BMP in AM Strict intake and output Hold diuretics and BP meds UA not very impressive (glucose in urine and not much else) Obtain renal US if not rapidly improving with supportive care  Depression with anxiety Pt on numerous psychotropic meds. Question if accidental OD may be to blame for tonight's presentation, especially given family statements above. Med rec still pending anyhow Holding psychotropic meds for tonight at least.  Hypothyroidism Resume home synthroid when med rec completed. Myxedema very unlikely with TSH only slightly elevated at 5.4 and T4 technically in normal range at 0.69.  Hypokalemia Replace K Repeat BMP in AM  Essential hypertension Hold home BP meds due to initial hypotension with EMS + AKI.      Advance Care Planning:   Code Status: Full Code No advanced directive in chart, previously full code, pt still a bit too altered to have code status discussions I feel.  Consults: None  Family Communication: Family at bedside  Severity of Illness: The appropriate patient status for this patient is OBSERVATION. Observation status is judged to be reasonable and necessary in order to provide the required intensity of service to ensure the patient's safety. The patient's presenting symptoms, physical exam findings, and initial radiographic and laboratory data in the context of their medical condition is felt to place them at decreased risk for further clinical deterioration. Furthermore, it is anticipated that the patient will be medically stable for discharge from the hospital within 2 midnights of admission.   Author: Etta Quill., DO 09/02/2022 12:47  AM  For on call review [email or web address].

## 2022-09-02 NOTE — Assessment & Plan Note (Signed)
Hold home BP meds due to initial hypotension with EMS + AKI.

## 2022-09-02 NOTE — Progress Notes (Signed)
  Echocardiogram 2D Echocardiogram has been performed.  Sue [name] M 09/02/2022, 1:43 PM

## 2022-09-02 NOTE — Inpatient Diabetes Management (Signed)
Inpatient Diabetes Program Recommendations  AACE/ADA: New Consensus Statement on Inpatient Glycemic Control (2015)  Target Ranges:  Prepandial:   less than 140 mg/dL      Peak postprandial:   less than 180 mg/dL (1-2 hours)      Critically ill patients:  140 - 180 mg/dL   Lab Results  Component Value Date   GLUCAP 318 (H) 09/02/2022   HGBA1C 7.7 (H) 03/14/2022    Review of Glycemic Control  Diabetes history: DM 2 Outpatient Diabetes medications: Trulicity 1.5 mg weekly, Toujeo 64 units qhs Current orders for Inpatient glycemic control:  Novolog 0-15 units Q4 hours  Glucose 318 on presentation. Pt on basal insulin at home.  Inpatient Diabetes Program Recommendations:    -  Add Semglee 25 units (0.2 units/kg)  Thanks,  Tama Headings RN, MSN, BC-ADM Inpatient Diabetes Coordinator Team Pager [phone] (8a-5p)

## 2022-09-02 NOTE — Progress Notes (Signed)
No charge AM fu Note.  Patient admitted early this morning for lethargy, hypotension, AKI.  She is already doing better, does not really remember the events of last night.  She tells me that for the last 2 to 3 days, she has had intermittent chest tightness at rest.  Denies frank chest pain, no pleurisy.  No cough or increased shortness of breath. -Continue present care, IV fluids, avoid nephrotoxins and hold her psychotropic medications for now -Strongly suspect unintentional psychiatric medication overdose, discussed with patient she does not use a pillbox, etc.  Just has pills in bottles and refers to medication list from doctor's office. -Given complaints of dyspnea, will check a D-dimer, trend troponin, and obtain echo

## 2022-09-02 NOTE — Assessment & Plan Note (Signed)
Replace K Repeat BMP in AM 

## 2022-09-02 NOTE — ED Notes (Signed)
Pt states that she would like to leave.  She is upset that she does not have a private bathroom in her room.  I explained to her that she is in the emergency department and there aren't private bathrooms in any of the rooms here, I offered a bedside commode and she declined.  I offered more blankets and she declined. I explained to pt that she would be leaving AMA and the risks associated with that.  Pt verbalized understanding.  I asked pt how she would get home and she states she would call her daughter to come get her. MD paged at this time.

## 2022-09-02 NOTE — Assessment & Plan Note (Addendum)
Lethargy, initial hypothermia.  Now improving. DDx is broad and includes: 1) sepsis 2) polypharmacy / accidental OD  Normal or unimpressive labs on work up thus far: UA only shows glucosuria BGL normal on presentation (not hypoglycemia causing her presentation today) CBC shows mild anemia with HGB of 11, no WBC COVID, FLU, and RSV all negative. CT head neg LFTs and ammonia normal TSH only slightly elevated with T4 in normal range CXR  Positive labs of possible significance thus far: UDS positive for benzos (is prescribed these) CMP shows AKI  Plan: Got sepsis ABx in ED But no suspected source of infection at this point, and only 1/4 SIRS criteria on presentation (hypothermia).  Sepsis seems a bit less likely. Check procalcitonin UCx and BCx pending Given family statements, accidental OD of psych meds seems like a strong possibility Would also explain why she is slowly improving with essentially just supportive care thus far Will hold off on ordering any of her home psych meds for the moment. Check VBG to rule out acidosis from acute hypercapnic failure. Consider MRI brain if not improving by later this AM.

## 2022-09-02 NOTE — ED Notes (Signed)
Pt signed AMA form.  Informed of risks of leaving including delay in care, worsening medical condition and/or death.  Pt verbalized understanding.  Pt is AAOx4.  MD Mohammed informed.

## 2022-09-03 LAB — T3: T3, Total: 69 ng/dL — ABNORMAL LOW (ref 71–180)

## 2022-09-03 LAB — URINE CULTURE: Culture: NO GROWTH

## 2022-09-03 NOTE — Discharge Summary (Signed)
Physician Discharge Summary   Patient: Sue [name] MRN: [id number] DOB: 03/14/1951  Admit date:     09/01/2022  Discharge date: 09/02/2022  Discharge Physician: Torell Minder Marry Guan   PCP: Ann Held, DO   Recommendations at discharge:    Patient Left AMA.  Discharge Diagnoses: Principal Problem:   Acute encephalopathy Active Problems:   AKI (acute kidney injury) ([place])   Depression with anxiety   Essential hypertension   Hypokalemia   Hypothyroidism   Hypotension   Hospital Course: Patient admitted early this morning 12/22 for lethargy, hypotension, AKI.  She is already doing better, does not really remember the events of last night.  She tells me that for the last 2 to 3 days, she has had intermittent chest tightness at rest.  Denies frank chest pain, no pleurisy.  No cough or increased shortness of breath. She was admitted, got a lot better with normal mental status with supportive care only. She remained stable. I strongly suspect unintentional overdose of her sedating medications. She was encouraged to use a pill box rather than just the bottles. Family feels she likely forgets and takes her meds a second time. In any case, around 4pm was contacted by RN that patient is leaving AMA.  Disposition:  left AMA  DISCHARGE MEDICATION: Allergies as of 09/02/2022       Reactions   Hydrocodone Other (See Comments)   Constipation and hallucinations   Norvasc [amlodipine Besylate] Swelling, Other (See Comments)   Marked swelling of the limbs   Tizanidine Other (See Comments)   After the 3rd dose, the patient's mouth began to feel numb and she felt like she was a having a "hot flash"        Medication List     ASK your doctor about these medications    Accu-Chek Guide test strip Generic drug: glucose blood USE 1 STRIP TO CHECK 4 TIMES DAILY   Accu-Chek Softclix Lancets lancets USE 1  TO CHECK GLUCOSE 4 TIMES DAILY   allopurinol 100 MG tablet Commonly  known as: ZYLOPRIM Take 100 mg by mouth daily as needed (for gout flareups).   blood glucose meter kit and supplies Kit Dispense based on patient and insurance preference. Use up to four times daily as directed. (FOR ICD-9 250.00, 250.01).   busPIRone 15 MG tablet Commonly known as: BUSPAR TAKE 1 TABLET BY MOUTH TWICE A DAY   famotidine 20 MG tablet Commonly known as: PEPCID Take 20 mg by mouth daily as needed for heartburn or indigestion.   gabapentin 100 MG capsule Commonly known as: NEURONTIN 1-2 po qhs   ibuprofen 800 MG tablet Commonly known as: ADVIL Take 800 mg by mouth every 8 (eight) hours as needed for headache.   Insulin Pen Needle 29G X 8MM Misc 1 Device by Does not apply route daily in the afternoon.   levothyroxine 88 MCG tablet Commonly known as: SYNTHROID Take 1 tablet (88 mcg total) by mouth daily.   LORazepam 1 MG tablet Commonly known as: ATIVAN TAKE 1/2 TO 1 AND 1/2 TABLETS AT BEDTIME AND AN ADDITIONAL 1/2 TAB ONCE A DAY AS NEEDED FOR AXIETY   Magnesium 400 MG Caps Take 1 capsule by mouth daily.   metoprolol succinate 50 MG 24 hr tablet Commonly known as: TOPROL-XL Take 2 tablets (100 mg total) by mouth daily. Take with or immediately following a meal   OXYGEN Inhale 3-4 L/min into the lungs continuous.   potassium chloride SA 20 MEQ tablet  Commonly known as: KLOR-CON M Take 1 tablet by mouth once daily   QUEtiapine 200 MG tablet Commonly known as: SEROquel Take 1 tablet (200 mg total) by mouth at bedtime.   rosuvastatin 10 MG tablet Commonly known as: Crestor Take 1 tablet (10 mg total) by mouth daily. For heart health and to lower cholesterol.   torsemide 20 MG tablet Commonly known as: DEMADEX Take 3 tablets (60 mg total) by mouth daily.   Toujeo SoloStar 300 UNIT/ML Solostar Pen Generic drug: insulin glargine (1 Unit Dial) Inject 64 Units into the skin daily in the afternoon.   Trulicity 1.5 DJ/2.4QA Sopn Generic drug:  Dulaglutide Inject 1.5 mg into the skin once a week.   zolpidem 12.5 MG CR tablet Commonly known as: AMBIEN CR TAKE 1 TABLET BY MOUTH EVERY DAY AT BEDTIME AS NEEDED FOR SLEEP        Discharge Exam: Filed Weights   09/01/22 1915  Weight: 128 kg   None, left AMA.  Condition at discharge:  AMA  The results of significant diagnostics from this hospitalization (including imaging, microbiology, ancillary and laboratory) are listed below for reference.   Imaging Studies: ECHOCARDIOGRAM COMPLETE  Result Date: 09/02/2022    ECHOCARDIOGRAM REPORT   Patient Name:   Sue [name] Date of Exam: 09/02/2022 Medical Rec #:  [id number]      Height:       63.0 in Accession #:    [id number]     Weight:       282.2 lb Date of Birth:  03/09/1951      BSA:          2.238 m Patient Age:    25 years       BP:           149/67 mmHg Patient Gender: F              HR:           90 bpm. Exam Location:  Inpatient Procedure: 2D Echo, Cardiac Doppler and Color Doppler Indications:    Dyspnea R06.00  History:        Patient has prior history of Echocardiogram examinations, most                 recent 06/21/2021. Stroke and Emphysema; Risk                 Factors:Hypertension and Diabetes. GERD.  Sonographer:    Darlina Sicilian RDCS Referring Phys: [id number] Macaela Presas [facility]  Sonographer Comments: Suboptimal parasternal window, suboptimal apical window and suboptimal subcostal window. Suboptimal imaging patient scanned upright for comfort. IMPRESSIONS  1. Limited study. Cannot fully assess wall motion. Left ventricular ejection fraction, by estimation, is 55 to 60%. The left ventricle has normal function. There is mild asymmetric left ventricular hypertrophy of the basal-septal segment. Left ventricular diastolic parameters are indeterminate.  2. Right ventricular systolic function was not well visualized. The right ventricular size is not well visualized.  3. The mitral valve is grossly normal. No evidence of mitral  valve regurgitation. No evidence of mitral stenosis.  4. The aortic valve was not well visualized. Aortic valve regurgitation is not visualized. No aortic stenosis is present. Comparison(s): No significant change from prior study. FINDINGS  Left Ventricle: Limited study. Cannot fully assess wall motion. Left ventricular ejection fraction, by estimation, is 55 to 60%. The left ventricle has normal function. The left ventricular internal cavity size was normal in size. There is mild asymmetric left  ventricular hypertrophy of the basal-septal segment. Left ventricular diastolic parameters are indeterminate. Right Ventricle: The right ventricular size is not well visualized. No increase in right ventricular wall thickness. Right ventricular systolic function was not well visualized. Left Atrium: Left atrial size was not well visualized. Right Atrium: Right atrial size was not well visualized. Pericardium: Trivial pericardial effusion is present. Mitral Valve: The mitral valve is grossly normal. No evidence of mitral valve regurgitation. No evidence of mitral valve stenosis. Tricuspid Valve: The tricuspid valve is grossly normal. Tricuspid valve regurgitation is not demonstrated. Aortic Valve: The aortic valve was not well visualized. Aortic valve regurgitation is not visualized. No aortic stenosis is present. Pulmonic Valve: The pulmonic valve was normal in structure. Pulmonic valve regurgitation is not visualized. No evidence of pulmonic stenosis. Aorta: The aortic root and ascending aorta are structurally normal, with no evidence of dilitation. IAS/Shunts: The interatrial septum was not well visualized.  LEFT VENTRICLE PLAX 2D LVIDd:         4.10 cm LVIDs:         2.80 cm LV PW:         0.80 cm LV IVS:        1.30 cm LVOT diam:     2.30 cm LVOT Area:     4.15 cm   AORTA Ao Root diam: 2.60 cm Ao Asc diam:  3.20 cm  SHUNTS Systemic Diam: 2.30 cm Phineas Inches Electronically signed by Phineas Inches Signature Date/Time:  09/02/2022/1:50:46 PM    Final    CT HEAD WO CONTRAST (5MM)  Result Date: 09/01/2022 CLINICAL DATA:  Altered mental status EXAM: CT HEAD WITHOUT CONTRAST TECHNIQUE: Contiguous axial images were obtained from the base of the skull through the vertex without intravenous contrast. RADIATION DOSE REDUCTION: This exam was performed according to the departmental dose-optimization program which includes automated exposure control, adjustment of the mA and/or kV according to patient size and/or use of iterative reconstruction technique. COMPARISON:  MRI brain dated 01/01/2020 FINDINGS: Brain: No evidence of acute infarction, hemorrhage, hydrocephalus, extra-axial collection or mass lesion/mass effect. Mild subcortical white matter and periventricular small vessel ischemic changes. Vascular: Intracranial atherosclerosis. Skull: Normal. Negative for fracture or focal lesion. Sinuses/Orbits: Chronic opacity in the right maxillary sinus. Visualized paranasal sinuses and mastoid air cells are otherwise clear. Other: None. IMPRESSION: No acute intracranial abnormality. Mild small vessel ischemic changes. Electronically Signed   By: Julian Hy M.D.   On: 09/01/2022 21:25   DG Chest Port 1 View  Result Date: 09/01/2022 CLINICAL DATA:  Questionable sepsis EXAM: PORTABLE CHEST 1 VIEW COMPARISON:  Chest x-ray 05/02/2020 FINDINGS: The heart size and mediastinal contours are within normal limits. Both lungs are clear. The visualized skeletal structures are unremarkable. IMPRESSION: No active disease. Electronically Signed   By: Ronney Asters M.D.   On: 09/01/2022 19:53    Microbiology: Results for orders placed or performed during the hospital encounter of 09/01/22  Resp panel by RT-PCR (RSV, Flu A&B, Covid) Anterior Nasal Swab     Status: None   Collection Time: 09/01/22  7:19 PM   Specimen: Anterior Nasal Swab  Result Value Ref Range Status   SARS Coronavirus 2 by RT PCR NEGATIVE NEGATIVE Final    Comment:  (NOTE) SARS-CoV-2 target nucleic acids are NOT DETECTED.  The SARS-CoV-2 RNA is generally detectable in upper respiratory specimens during the acute phase of infection. The lowest concentration of SARS-CoV-2 viral copies this assay can detect is 138 copies/mL. A negative result does not  preclude SARS-Cov-2 infection and should not be used as the sole basis for treatment or other patient management decisions. A negative result may occur with  improper specimen collection/handling, submission of specimen other than nasopharyngeal swab, presence of viral mutation(s) within the areas targeted by this assay, and inadequate number of viral copies(<138 copies/mL). A negative result must be combined with clinical observations, patient history, and epidemiological information. The expected result is Negative.  Fact Sheet for Patients:  EntrepreneurPulse.com.au  Fact Sheet for Healthcare Providers:  IncredibleEmployment.be  This test is no t yet approved or cleared by the [place] FDA and  has been authorized for detection and/or diagnosis of SARS-CoV-2 by FDA under an Emergency Use Authorization (EUA). This EUA will remain  in effect (meaning this test can be used) for the duration of the COVID-19 declaration under Section 564(b)(1) of the Act, 21 U.S.C.section 360bbb-3(b)(1), unless the authorization is terminated  or revoked sooner.       Influenza A by PCR NEGATIVE NEGATIVE Final   Influenza B by PCR NEGATIVE NEGATIVE Final    Comment: (NOTE) The Xpert Xpress SARS-CoV-2/FLU/RSV plus assay is intended as an aid in the diagnosis of influenza from Nasopharyngeal swab specimens and should not be used as a sole basis for treatment. Nasal washings and aspirates are unacceptable for Xpert Xpress SARS-CoV-2/FLU/RSV testing.  Fact Sheet for Patients: EntrepreneurPulse.com.au  Fact Sheet for Healthcare  Providers: IncredibleEmployment.be  This test is not yet approved or cleared by the [place] FDA and has been authorized for detection and/or diagnosis of SARS-CoV-2 by FDA under an Emergency Use Authorization (EUA). This EUA will remain in effect (meaning this test can be used) for the duration of the COVID-19 declaration under Section 564(b)(1) of the Act, 21 U.S.C. section 360bbb-3(b)(1), unless the authorization is terminated or revoked.     Resp Syncytial Virus by PCR NEGATIVE NEGATIVE Final    Comment: (NOTE) Fact Sheet for Patients: EntrepreneurPulse.com.au  Fact Sheet for Healthcare Providers: IncredibleEmployment.be  This test is not yet approved or cleared by the [place] FDA and has been authorized for detection and/or diagnosis of SARS-CoV-2 by FDA under an Emergency Use Authorization (EUA). This EUA will remain in effect (meaning this test can be used) for the duration of the COVID-19 declaration under Section 564(b)(1) of the Act, 21 U.S.C. section 360bbb-3(b)(1), unless the authorization is terminated or revoked.  Performed at [facility], [place] [address] [place], [place] [address]   Blood Culture (routine x 2)     Status: None (Preliminary result)   Collection Time: 09/01/22  8:07 PM   Specimen: BLOOD  Result Value Ref Range Status   Specimen Description BLOOD SITE NOT SPECIFIED  Final   Special Requests   Final    BOTTLES DRAWN AEROBIC AND ANAEROBIC Blood Culture adequate volume   Culture   Final    NO GROWTH < 12 HOURS Performed at [facility], [place] [address] [place], [place] [address]    Report Status PENDING  Incomplete  Blood Culture (routine x 2)     Status: None (Preliminary result)   Collection Time: 09/01/22  8:07 PM   Specimen: BLOOD  Result Value Ref Range Status   Specimen Description BLOOD SITE NOT SPECIFIED  Final   Special Requests   Final    BOTTLES DRAWN  AEROBIC AND ANAEROBIC Blood Culture adequate volume   Culture   Final    NO GROWTH < 12 HOURS Performed at [facility], [address] [place], [place] [address]    Report Status PENDING  Incomplete    Labs: CBC: Recent Labs  Lab 09/01/22 2008 09/01/22 2043 09/02/22 0258 09/02/22 0313  WBC 5.2  --  5.4  --   NEUTROABS 2.8  --   --   --   HGB 11.7* 12.6 10.5* 10.5*  HCT 35.4* 37.0 32.2* 31.0*  MCV 97.0  --  97.3  --   PLT 159  --  143*  --    Basic Metabolic Panel: Recent Labs  Lab 09/01/22 2008 09/01/22 2043 09/02/22 0258 09/02/22 0313 09/02/22 1047  NA 136 138 136 137  --   K 2.9* 2.9* 4.7 3.0*  --   CL 94* 94* 98  --   --   CO2 30  --  27  --   --   GLUCOSE 288* 295* 199*  --   --   BUN 28* 35* 24*  --   --   CREATININE 2.44* 2.40* 2.04*  --  2.02*  CALCIUM 9.3  --  8.7*  --   --    Liver Function Tests: Recent Labs  Lab 09/01/22 2008  AST 33  ALT 19  ALKPHOS 114  BILITOT 0.6  PROT 7.7  ALBUMIN 3.0*   CBG: Recent Labs  Lab 09/02/22 0253 09/02/22 0837 09/02/22 1346  GLUCAP 192* 318* 302*    Discharge time spent: less than 30 minutes.  Signed: Genesis Novosad Marry Guan, MD [facility] 09/03/2022

## 2022-09-06 LAB — CULTURE, BLOOD (ROUTINE X 2)
Culture: NO GROWTH
Culture: NO GROWTH
Special Requests: ADEQUATE
Special Requests: ADEQUATE

## 2022-09-10 ENCOUNTER — Telehealth: Payer: Self-pay | Admitting: Behavioral Health

## 2022-09-10 DIAGNOSIS — F411 Generalized anxiety disorder: Secondary | ICD-10-CM

## 2022-09-13 NOTE — Telephone Encounter (Signed)
Please call to schedule an appt  

## 2022-09-19 ENCOUNTER — Other Ambulatory Visit: Payer: Self-pay | Admitting: Family Medicine

## 2022-09-19 DIAGNOSIS — R0609 Other forms of dyspnea: Secondary | ICD-10-CM

## 2022-09-19 DIAGNOSIS — R600 Localized edema: Secondary | ICD-10-CM

## 2022-09-23 NOTE — Telephone Encounter (Signed)
Pt has appt 2/12

## 2022-09-26 ENCOUNTER — Ambulatory Visit: Payer: Self-pay | Admitting: Licensed Clinical Social Worker

## 2022-09-26 NOTE — Patient Instructions (Signed)
Visit Information  Thank you for taking time to visit with me today. Please don't hesitate to contact me if I can be of assistance to you before our next scheduled telephone appointment.  Following are the goals we discussed today:   Our next appointment is by telephone on 10/24/22 at 10:30 AM   Please call the care guide team at [phone] if you need to cancel or reschedule your appointment.   If you are experiencing a Mental Health or [place] or need someone to talk to, please go to [facility] [place] [phone])   Following is a copy of your full plan of care:  Interventions Sue [name] and LCSW spoke via phone today about program support. She is interested in program support. She spoke of support with PCP Dr. Roma Schanz. Client has support with psychiatry She said next virtual appointment with psychiatry is on October 25, 2022. She is taking prescribed medications for her mood and feels that  these medications for her mood are working well.  Discussed transportation. She said her family help her with transport assistance Discussed food procurement Discussed medication procurement Discussed RN support with program.  Client also said she has several nurses in her family with whom she discusses her nursing needs. Discussed oxygen use.  Sue [name] said she has her oxygen at 4 liters per minute by canula. Discussed ambulation. She has difficulty standing . She uses walker to help her walk Discussed energy level. She said she is often fatigued. Discussed insurance coverage of client Discussed supplier of her oxygen equipment.  She said she has been using oxygen for about 10 years.  She spoke of COPD. She sees Pulmonologist about every 6 months.  Discussed edema issues of client Client agreed for LCSW to call her on 10/24/22 at 10:30 AM  Sue [name] was given information about Care Management services by the embedded  care coordination team including:  Care Management services include personalized support from designated clinical staff supervised by her physician, including individualized plan of care and coordination with other care providers 24/7 contact phone numbers for assistance for urgent and routine care needs. The patient may stop CCM services at any time (effective at the end of the month) by phone call to the office staff.  Patient agreed to services and verbal consent obtained.   Norva Riffle.[name] MSW, [place] Holiday representative [facility] Care Management [phone]

## 2022-09-26 NOTE — Patient Outreach (Signed)
  Care Coordination   Initial Visit Note   09/26/2022 Name: Sue [name] MRN: [id number] DOB: 09/30/1950  Sue [name] is a 72 y.o. year old female who sees Carollee Herter, Alferd Apa, DO for primary care. I spoke with  [name] by phone today.  What matters to the patients health and wellness today? Client is interested in program support. She has fall risk, difficulty in standing, uses walker to [employer]    Goals Addressed             This Visit's Progress    Patient is interested in program support. Would like to learn more about nursing support and SW support       Interventions Vaishali and LCSW spoke via phone today about program support. She is interested in program support. She spoke of support with PCP Dr. Roma Schanz. Client has support with psychiatry She said next virtual appointment with psychiatry is on October 25, 2022. She is taking prescribed medications for her mood and feels that  these medications for her mood are working well.  Discussed transportation. She said her family help her with transport assistance Discussed food procurement Discussed medication procurement Discussed RN support with program.  Client also said she has several nurses in her family with whom she discusses her nursing needs. Discussed oxygen use.  Alizea said she has her oxygen at 4 liters per minute by canula. Discussed ambulation. She has difficulty standing . She uses walker to help her walk Discussed energy level. She said she is often fatigued. Discussed insurance coverage of client Discussed supplier of her oxygen equipment.  She said she has been using oxygen for about 10 years.  She spoke of COPD. She sees Pulmonologist about every 6 months.  Discussed edema issues of client Client agreed for LCSW to call her on 10/24/22 at 10:30 AM      SDOH assessments and interventions completed:  Yes  SDOH Interventions Today    Flowsheet Row Most Recent Value  SDOH Interventions    Depression Interventions/Treatment  Medication, Counseling  Physical Activity Interventions Other (Comments)  [difficulty in standing . Uses a walker to help her walk]  Stress Interventions --  [client has stress in managing medical needs. she uses oxygen as needed]        Care Coordination Interventions:  Yes, provided   Follow up plan: Follow up call scheduled for 10/24/22 at 10:30 AM    Encounter Outcome:  Pt. Visit Completed   Norva Riffle.Ronnisha Felber MSW, [place] Holiday representative [facility] Care Management [phone]

## 2022-10-03 ENCOUNTER — Encounter: Payer: Medicare HMO | Admitting: Neurology

## 2022-10-07 ENCOUNTER — Other Ambulatory Visit: Payer: Self-pay | Admitting: Behavioral Health

## 2022-10-07 DIAGNOSIS — F411 Generalized anxiety disorder: Secondary | ICD-10-CM

## 2022-10-10 ENCOUNTER — Other Ambulatory Visit: Payer: Self-pay | Admitting: Behavioral Health

## 2022-10-10 DIAGNOSIS — F411 Generalized anxiety disorder: Secondary | ICD-10-CM

## 2022-10-10 DIAGNOSIS — F3131 Bipolar disorder, current episode depressed, mild: Secondary | ICD-10-CM

## 2022-10-10 DIAGNOSIS — F331 Major depressive disorder, recurrent, moderate: Secondary | ICD-10-CM

## 2022-10-10 DIAGNOSIS — F5105 Insomnia due to other mental disorder: Secondary | ICD-10-CM

## 2022-10-11 NOTE — Telephone Encounter (Signed)
Last filled 12/30 appt 2/12

## 2022-10-12 DIAGNOSIS — I129 Hypertensive chronic kidney disease with stage 1 through stage 4 chronic kidney disease, or unspecified chronic kidney disease: Secondary | ICD-10-CM | POA: Diagnosis not present

## 2022-10-12 DIAGNOSIS — E1122 Type 2 diabetes mellitus with diabetic chronic kidney disease: Secondary | ICD-10-CM | POA: Diagnosis not present

## 2022-10-12 DIAGNOSIS — E785 Hyperlipidemia, unspecified: Secondary | ICD-10-CM | POA: Diagnosis not present

## 2022-10-12 DIAGNOSIS — N183 Chronic kidney disease, stage 3 unspecified: Secondary | ICD-10-CM | POA: Diagnosis not present

## 2022-10-13 DIAGNOSIS — E119 Type 2 diabetes mellitus without complications: Secondary | ICD-10-CM | POA: Diagnosis not present

## 2022-10-13 DIAGNOSIS — H2513 Age-related nuclear cataract, bilateral: Secondary | ICD-10-CM | POA: Diagnosis not present

## 2022-10-13 DIAGNOSIS — I10 Essential (primary) hypertension: Secondary | ICD-10-CM | POA: Diagnosis not present

## 2022-10-13 DIAGNOSIS — H35033 Hypertensive retinopathy, bilateral: Secondary | ICD-10-CM | POA: Diagnosis not present

## 2022-10-13 LAB — HM DIABETES EYE EXAM

## 2022-10-14 LAB — LAB REPORT - SCANNED
Creatinine, POC: 154.6 mg/dL
EGFR: 28
Protein/Creatinine Ratio: 86

## 2022-10-24 ENCOUNTER — Telehealth (INDEPENDENT_AMBULATORY_CARE_PROVIDER_SITE_OTHER): Payer: Medicare HMO | Admitting: Behavioral Health

## 2022-10-24 ENCOUNTER — Ambulatory Visit: Payer: Self-pay | Admitting: Licensed Clinical Social Worker

## 2022-10-24 ENCOUNTER — Encounter: Payer: Self-pay | Admitting: Behavioral Health

## 2022-10-24 DIAGNOSIS — F5105 Insomnia due to other mental disorder: Secondary | ICD-10-CM

## 2022-10-24 DIAGNOSIS — F99 Mental disorder, not otherwise specified: Secondary | ICD-10-CM

## 2022-10-24 DIAGNOSIS — F331 Major depressive disorder, recurrent, moderate: Secondary | ICD-10-CM

## 2022-10-24 DIAGNOSIS — F3131 Bipolar disorder, current episode depressed, mild: Secondary | ICD-10-CM | POA: Diagnosis not present

## 2022-10-24 DIAGNOSIS — F411 Generalized anxiety disorder: Secondary | ICD-10-CM | POA: Diagnosis not present

## 2022-10-24 MED ORDER — ZOLPIDEM TARTRATE ER 12.5 MG PO TBCR: 12.5000 mg | EXTENDED_RELEASE_TABLET | Freq: Every evening | ORAL | 2 refills | Status: DC | PRN

## 2022-10-24 MED ORDER — BUSPIRONE HCL 15 MG PO TABS: 15.0000 mg | ORAL_TABLET | Freq: Two times a day (BID) | ORAL | 3 refills | Status: DC

## 2022-10-24 MED ORDER — LORAZEPAM 1 MG PO TABS: ORAL_TABLET | ORAL | 2 refills | Status: DC

## 2022-10-24 MED ORDER — QUETIAPINE FUMARATE 200 MG PO TABS: 200.0000 mg | ORAL_TABLET | Freq: Every day | ORAL | 1 refills | Status: DC

## 2022-10-24 NOTE — Patient Instructions (Signed)
Visit Information  Thank you for taking time to visit with me today. Please don't hesitate to contact me if I can be of assistance to you before our next scheduled telephone appointment.  Following are the goals we discussed today:    Our next appointment is by telephone on 11/29/22 at 3:00 PM  Please call the care guide team at [phone] if you need to cancel or reschedule your appointment.   If you are experiencing a Mental Health or [place] or need someone to talk to, please go to [facility] [place] [phone])   Following is a copy of your full plan of care:   Interventions Letta Median and LCSW spoke via phone today about program support. She is interested in program support. She spoke of support with PCP Dr. Roma Schanz. Client has support with psychiatry She has  virtual appointment with psychiatry on October 25, 2022. She is taking prescribed medications for her mood and feels that  these medications for her mood are working well.  Discussed transportation. She said her family help her with transport assistance Discussed medication procurement Discussed oxygen use.  Adamarys said she has her oxygen at 4 liters per minute by canula. Discussed ambulation. She has difficulty standing . She uses walker to help her walk Discussed energy level. She said she is often fatigued. Discussed edema issues Client and LCSW spoke of eye needs of client. She said she thinks she has cataracts She has eye appointment this week with ophthalmologist to evaluate needs of her eyes Discussed pain issues. Client has pains in her legs. Discussed insomnia. She said she has difficulty sleeping Provided counseling support for client  Ms. Herzberger was given information about Care Management services by the embedded care coordination team including:  Care Management services include personalized support from designated clinical staff  supervised by her physician, including individualized plan of care and coordination with other care providers 24/7 contact phone numbers for assistance for urgent and routine care needs. The patient may stop CCM services at any time (effective at the end of the month) by phone call to the office staff.  Patient agreed to services and verbal consent obtained.   Norva Riffle.Ayuub Penley MSW, [place] Holiday representative [facility] Care Management [phone]

## 2022-10-24 NOTE — Patient Outreach (Signed)
  Care Coordination   Follow Up Visit Note   10/24/2022 Name: Sue [name] MRN: [id number] DOB: 03-31-1951  Sue [name] is a 72 y.o. year old female who sees Carollee Herter, Alferd Apa, DO for primary care. I spoke with  [name] by phone today.  What matters to the patients health and wellness today? Patient interested in program support. She would like to learn more about nursing support and SW support    Goals Addressed             This Visit's Progress    Patient is interested in program support. Would like to learn more about nursing support and SW support       Interventions Jemma and LCSW spoke via phone today about program support. She is interested in program support. She spoke of support with PCP Dr. Roma Schanz. Client has support with psychiatry She has  virtual appointment with psychiatry on October 25, 2022. She is taking prescribed medications for her mood and feels that  these medications for her mood are working well.  Discussed transportation. She said her family help her with transport assistance Discussed medication procurement Discussed oxygen use.  [place] said she has her oxygen at 4 liters per minute by [place]ula. Discussed ambulation. She has difficulty standing . She uses walker to help her walk Discussed energy level. She said she is often fatigued. Discussed edema issues Client and LCSW spoke of eye needs of client. She said she thinks she has cataracts She has eye appointment this week with ophthalmologist to evaluate needs of her eyes Discussed pain issues. Client has pains in her legs. Discussed insomnia. She said she has difficulty sleeping Provided counseling support for client        SDOH assessments and interventions completed:  Yes  SDOH Interventions Today    Flowsheet Row Most Recent Value  SDOH Interventions   Depression Interventions/Treatment  Counseling, Medication  Stress Interventions Provide Counseling  [client has stress  related to managing medical needs]        Care Coordination Interventions:  Yes, provided   Interventions Today    Flowsheet Row Most Recent Value  Chronic Disease Discussed/Reviewed   Chronic disease discussed/reviewed during today's visit Other  [Depression and anxiety]  General Interventions   General Interventions Discussed/Reviewed General Interventions Discussed  Education Interventions   Education Provided Provided Verbal Education  Provided Verbal Education On When to see the doctor  Mental Health Interventions   Mental Health Discussed/Reviewed Coping Strategies        Follow up plan: Follow up call scheduled for 11/29/22 at 3:00 PM     Encounter Outcome:  Pt. Visit Completed   Norva Riffle.Daksha Koone MSW, [place] representative [facility] Care Management [phone]

## 2022-10-24 NOTE — Progress Notes (Signed)
Sue [name] [id number] 10-Jul-1951 72 y.o.  Virtual Visit via Telephone Note  I connected with pt on 10/24/22 at  3:30 PM EST by telephone and verified that I am speaking with the correct person using two identifiers.   I discussed the limitations, risks, security and privacy concerns of performing an evaluation and management service by telephone and the availability of in person appointments. I also discussed with the patient that there may be a patient responsible charge related to this service. The patient expressed understanding and agreed to proceed.   I discussed the assessment and treatment plan with the patient. The patient was provided an opportunity to ask questions and all were answered. The patient agreed with the plan and demonstrated an understanding of the instructions.   The patient was advised to call back or seek an in-person evaluation if the symptoms worsen or if the condition fails to improve as anticipated.  I provided 20 minutes of non-face-to-face time during this encounter.  The patient was located at home.  The provider was located at [place].   Elwanda [place], NP   Subjective:   Patient ID:  Sue [name] is a 72 y.o. (DOB 07-25-1951) female.  Chief Complaint:  Chief Complaint  Patient presents with   Depression   Anxiety   Follow-up   Patient Education   Medication Refill    HPI  72  year old female presents to this office for follow up and medication management.  Says she is managing the best she can. Says that she has no complaint today.  Requesting no medication changes this visit. Says she is doing ok.  Currently her anxiety level is 3/10 and depression is 3/10 but says that health issues keep her at this level.  She denies having any problems taking this medication. Denies any current mania which she said was dominate when she was younger. No psychosis, no SI/HI. She has strong family hx of bipolar and schizophrenia on fathers side.     Unable to obtain prior medication list.    Review of Systems:  Review of Systems  Constitutional: Negative.   Allergic/Immunologic: Negative.   Neurological: Negative.   Psychiatric/Behavioral:  Positive for dysphoric mood.     Medications: I have reviewed the patient's current medications.  Current Outpatient Medications  Medication Sig Dispense Refill   Accu-Chek Softclix Lancets lancets USE 1  TO CHECK GLUCOSE 4 TIMES DAILY 100 each 0   allopurinol (ZYLOPRIM) 100 MG tablet Take 100 mg by mouth daily as needed (for gout flareups).     blood glucose meter kit and supplies KIT Dispense based on patient and insurance preference. Use up to four times daily as directed. (FOR ICD-9 250.00, 250.01). 1 each 0   busPIRone (BUSPAR) 15 MG tablet Take 1 tablet (15 mg total) by mouth 2 (two) times daily. 60 tablet 3   Dulaglutide (TRULICITY) 1.5 0000000 SOPN Inject 1.5 mg into the skin once a week. 6 mL 3   famotidine (PEPCID) 20 MG tablet Take 20 mg by mouth daily as needed for heartburn or indigestion.     gabapentin (NEURONTIN) 100 MG capsule 1-2 po qhs (Patient taking differently: Take 100 mg by mouth at bedtime.) 60 capsule 3   glucose blood (ACCU-CHEK GUIDE) test strip USE 1 STRIP TO CHECK 4 TIMES DAILY 100 each 0   ibuprofen (ADVIL) 800 MG tablet Take 800 mg by mouth every 8 (eight) hours as needed for headache.     insulin  glargine, 1 Unit Dial, (TOUJEO SOLOSTAR) 300 UNIT/ML Solostar Pen Inject 64 Units into the skin daily in the afternoon. 15 mL 6   Insulin Pen Needle 29G X 8MM MISC 1 Device by Does not apply route daily in the afternoon. 100 each 3   levothyroxine (SYNTHROID) 88 MCG tablet Take 1 tablet (88 mcg total) by mouth daily. 90 tablet 3   LORazepam (ATIVAN) 1 MG tablet TAKE 1/2 TO 1 AND 1/2 TABLETS AT BEDTIME AND AN ADDITIONAL 1/2 TAB ONCE A DAY AS NEEDED FOR AXIETY 60 tablet 2   Magnesium 400 MG CAPS Take 1 capsule by mouth daily. 30 capsule 0   metoprolol succinate  (TOPROL-XL) 50 MG 24 hr tablet Take 2 tablets (100 mg total) by mouth daily. Take with or immediately following a meal 180 tablet 1   OXYGEN Inhale 3-4 L/min into the lungs continuous.      potassium chloride SA (KLOR-CON M) 20 MEQ tablet Take 1 tablet by mouth once daily 90 tablet 0   QUEtiapine (SEROQUEL) 200 MG tablet Take 1 tablet (200 mg total) by mouth at bedtime. 90 tablet 1   rosuvastatin (CRESTOR) 10 MG tablet Take 1 tablet (10 mg total) by mouth daily. For heart health and to lower cholesterol. 90 tablet 3   torsemide (DEMADEX) 20 MG tablet Take 3 tablets by mouth once daily 270 tablet 1   zolpidem (AMBIEN CR) 12.5 MG CR tablet Take 1 tablet (12.5 mg total) by mouth at bedtime as needed. for sleep 30 tablet 2   No current facility-administered medications for this visit.    Medication Side Effects: None  Allergies:  Allergies  Allergen Reactions   Hydrocodone Other (See Comments)    Constipation and hallucinations   Norvasc [Amlodipine Besylate] Swelling and Other (See Comments)    Marked swelling of the limbs   Tizanidine Other (See Comments)    After the 3rd dose, the patient's mouth began to feel numb and she felt like she was a having a "hot flash"    Past Medical History:  Diagnosis Date   Allergic rhinitis    Depression    Diabetes mellitus type 2, uncontrolled    Emphysema of lung (HCC)    3L home O2   GERD (gastroesophageal reflux disease)    Hypertension    Hypothyroidism    Obesity, morbid, BMI 50 or higher ([place])    Stroke ([place]) 2016   TIA    Urine incontinence     Family History  Problem Relation Age of Onset   Heart disease Father        MVP and Pics Valve   Hypertension Father    Depression Father        Institutionalized x's 2 years   Bipolar disorder Father    Hypertension Sister    Diabetes Sister    Hyperlipidemia Sister    Heart disease Sister 82       MI   Heart disease Brother    Hypertension Brother    Schizophrenia Paternal Aunt     Depression Paternal Aunt    Anxiety disorder Paternal Aunt    Heart disease Paternal Aunt    Schizophrenia Paternal Aunt    Heart disease Paternal Uncle    Heart disease Paternal Grandmother    Asthma Son    Asthma Son     Social History   Socioeconomic History   Marital status: Divorced    Spouse name: Not on file   Number of children:  4   Years of education: 14   Highest education level: Associate degree: academic program  Occupational History   Occupation: disabled- Occupational hygienist Med  Tobacco Use   Smoking status: Former    Packs/day: 1.00    Years: 40.00    Total pack years: 40.00    Types: Cigarettes    Start date: 07/21/1972    Quit date: 10/16/2011    Years since quitting: 11.0   Smokeless tobacco: Never  Vaping Use   Vaping Use: Never used  Substance and Sexual Activity   Alcohol use: Not Currently    Comment: Occ-- Wine   Drug use: No   Sexual activity: Not Currently  Other Topics Concern   Not on file  Social History Narrative      Originally from [place]. Has also lived in [place], [place]. She also previously lived in [place] for 20 years. No history of [place] Fever. Moved to [place] in 1989. 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 No pets currently. No bird exposure. Questionable previous mold exposure in her daughter's home. 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       Lives in [place] alone in assisted living.  Has multiple chronic illness.      Right Handed    Lives in a one story home    Social Determinants of Health   Financial Resource Strain: Low Risk  (01/24/2022)   Overall Financial Resource Strain (CARDIA)    Difficulty of Paying Living Expenses: Not hard at all  Food Insecurity: No Food Insecurity (01/24/2022)   Hunger  Vital Sign    Worried About Running Out of Food in the Last Year: Never true    Ran Out of Food in the Last Year: Never true  Transportation Needs: No Transportation Needs (01/24/2022)   PRAPARE - Hydrologist (Medical): No    Lack of Transportation (Non-Medical): No  Physical Activity: Inactive (10/24/2022)   Exercise Vital Sign    Days of Exercise per Week: 0 days    Minutes of Exercise per Session: 0 min  Stress: Stress Concern Present (10/24/2022)   [place]    Feeling of Stress : To some extent  Social Connections: Moderately Integrated (01/24/2022)   Social Connection and Isolation Panel [NHANES]    Frequency of Communication with Friends and Family: More than three times a week    Frequency of Social Gatherings with Friends and Family: Three times a week    Attends Religious Services: 1 to 4 times per year    Active Member of Clubs or Organizations: Yes    Attends Archivist Meetings: More than 4 times per year    Marital Status: Divorced  Intimate Partner Violence: Not At Risk (01/24/2022)   Humiliation, Afraid, Rape, and Kick questionnaire    Fear of Current or Ex-Partner: No    Emotionally Abused: No    Physically Abused: No    Sexually Abused: No    Past Medical History, Surgical history, Social history, and Family history were reviewed and updated as appropriate.   Please see review of systems for further details on the patient's review from today.   Objective:   Physical Exam:  There were  no vitals taken for this visit.  Physical Exam Neurological:     Mental Status: She is alert and oriented to person, place, and time.  Psychiatric:        Attention and Perception: Attention and perception normal.        Mood and Affect: Mood normal.        Speech: Speech normal.        Behavior: Behavior normal. Behavior is cooperative.        Cognition and Memory:  Cognition and memory normal.        Judgment: Judgment normal.     Comments: Insight intact     Lab Review:     Component Value Date/Time   NA 137 09/02/2022 0313   NA 137 05/27/2021 1511   K 3.0 (L) 09/02/2022 0313   CL 98 09/02/2022 0258   CO2 27 09/02/2022 0258   GLUCOSE 199 (H) 09/02/2022 0258   BUN 24 (H) 09/02/2022 0258   BUN 13 05/27/2021 1511   CREATININE 2.02 (H) 09/02/2022 1047   CREATININE 1.45 (H) 07/09/2021 1446   CALCIUM 8.7 (L) 09/02/2022 0258   PROT 7.7 09/01/2022 2008   ALBUMIN 3.0 (L) 09/01/2022 2008   AST 33 09/01/2022 2008   ALT 19 09/01/2022 2008   ALKPHOS 114 09/01/2022 2008   BILITOT 0.6 09/01/2022 2008   GFRNONAA 26 (L) 09/02/2022 1047   GFRAA 31 (L) 01/01/2020 0951       Component Value Date/Time   WBC 5.4 09/02/2022 0258   RBC 3.31 (L) 09/02/2022 0258   HGB 10.5 (L) 09/02/2022 0313   HGB 12.6 01/17/2018 1616   HCT 31.0 (L) 09/02/2022 0313   HCT 32.3 (L) 03/10/2019 0545   PLT 143 (L) 09/02/2022 0258   PLT 235 01/17/2018 1616   MCV 97.3 09/02/2022 0258   MCV 96 01/17/2018 1616   MCH 31.7 09/02/2022 0258   MCHC 32.6 09/02/2022 0258   RDW 13.2 09/02/2022 0258   RDW 14.0 01/17/2018 1616   LYMPHSABS 1.9 09/01/2022 2008   MONOABS 0.4 09/01/2022 2008   EOSABS 0.1 09/01/2022 2008   BASOSABS 0.0 09/01/2022 2008    No results found for: "POCLITH", "LITHIUM"   No results found for: "PHENYTOIN", "PHENOBARB", "VALPROATE", "CBMZ"   .res Assessment: Plan:    Recommendations/Plan   Greater than 50% of  20  min  telephonic interview with patient was spent on counseling and coordination of care.We reviewed her currently level of stability.  She is not requesting any changes today but needs refills.  Today we agreed to: Continue Zolpidem 12.5 mg CR Continue Buspar 15 mg twice daily Continue Ativan 0.5-1.5 tablets  (1 mg 0.5-1.5 mg total) by mouth. Take 1-1.5 mg at bedtime and an additional 0.5 mg once a day needed for anxiety. Increased   Seroquel to 200 mg daily at bedtime To contact this office if worsening symptoms Will follow up in 6 months to reassess. She is requesting video visit next time due to ambulation and transportation issues. Provided emergency contact information Reviewed PDMP  Aaron Edelman A. Tirso Laws, NP     Sue [name] was seen today for depression, anxiety, follow-up, patient education and medication refill.  Diagnoses and all orders for this visit:  Bipolar affective disorder, currently depressed, mild (HCC) -     QUEtiapine (SEROQUEL) 200 MG tablet; Take 1 tablet (200 mg total) by mouth at bedtime.  Generalized anxiety disorder -     QUEtiapine (SEROQUEL) 200 MG tablet; Take 1 tablet (200 mg  total) by mouth at bedtime. -     busPIRone (BUSPAR) 15 MG tablet; Take 1 tablet (15 mg total) by mouth 2 (two) times daily. -     LORazepam (ATIVAN) 1 MG tablet; TAKE 1/2 TO 1 AND 1/2 TABLETS AT BEDTIME AND AN ADDITIONAL 1/2 TAB ONCE A DAY AS NEEDED FOR AXIETY  Insomnia due to other mental disorder -     zolpidem (AMBIEN CR) 12.5 MG CR tablet; Take 1 tablet (12.5 mg total) by mouth at bedtime as needed. for sleep -     QUEtiapine (SEROQUEL) 200 MG tablet; Take 1 tablet (200 mg total) by mouth at bedtime.  Major depressive disorder, recurrent episode, moderate (HCC) -     QUEtiapine (SEROQUEL) 200 MG tablet; Take 1 tablet (200 mg total) by mouth at bedtime.    Please see After Visit Summary for patient specific instructions.  Future Appointments  Date Time Provider [place]  11/02/2022 10:50 AM Shamleffer, Melanie Crazier, MD [facility] None  11/14/2022 10:00 AM Ann Held, DO [facility]  11/28/2022  2:00 PM Shellia Carwin, MD [facility] None  11/29/2022  3:00 PM Forrest, Norva Riffle, LCSW [facility] None  12/14/2022 10:30 AM Shellia Carwin, MD [facility] None    No orders of the defined types were placed in this encounter.     -------------------------------

## 2022-10-28 ENCOUNTER — Emergency Department (HOSPITAL_BASED_OUTPATIENT_CLINIC_OR_DEPARTMENT_OTHER): Payer: Medicare HMO

## 2022-10-28 ENCOUNTER — Other Ambulatory Visit: Payer: Self-pay

## 2022-10-28 ENCOUNTER — Emergency Department (HOSPITAL_BASED_OUTPATIENT_CLINIC_OR_DEPARTMENT_OTHER)
Admission: EM | Admit: 2022-10-28 | Discharge: 2022-10-28 | Disposition: A | Discharge status: Home / Self Care | Payer: Medicare HMO | Attending: Emergency Medicine | Admitting: Emergency Medicine

## 2022-10-28 ENCOUNTER — Telehealth: Payer: Self-pay | Admitting: Family Medicine

## 2022-10-28 ENCOUNTER — Encounter (HOSPITAL_BASED_OUTPATIENT_CLINIC_OR_DEPARTMENT_OTHER): Payer: Self-pay | Admitting: Emergency Medicine

## 2022-10-28 DIAGNOSIS — E119 Type 2 diabetes mellitus without complications: Secondary | ICD-10-CM | POA: Diagnosis not present

## 2022-10-28 DIAGNOSIS — H2513 Age-related nuclear cataract, bilateral: Secondary | ICD-10-CM | POA: Diagnosis not present

## 2022-10-28 DIAGNOSIS — E876 Hypokalemia: Secondary | ICD-10-CM | POA: Diagnosis not present

## 2022-10-28 DIAGNOSIS — Z794 Long term (current) use of insulin: Secondary | ICD-10-CM | POA: Insufficient documentation

## 2022-10-28 DIAGNOSIS — Z79899 Other long term (current) drug therapy: Secondary | ICD-10-CM | POA: Insufficient documentation

## 2022-10-28 DIAGNOSIS — R350 Frequency of micturition: Secondary | ICD-10-CM | POA: Diagnosis not present

## 2022-10-28 DIAGNOSIS — Z7984 Long term (current) use of oral hypoglycemic drugs: Secondary | ICD-10-CM | POA: Insufficient documentation

## 2022-10-28 DIAGNOSIS — Z8673 Personal history of transient ischemic attack (TIA), and cerebral infarction without residual deficits: Secondary | ICD-10-CM | POA: Insufficient documentation

## 2022-10-28 DIAGNOSIS — E669 Obesity, unspecified: Secondary | ICD-10-CM | POA: Diagnosis not present

## 2022-10-28 DIAGNOSIS — I10 Essential (primary) hypertension: Secondary | ICD-10-CM | POA: Insufficient documentation

## 2022-10-28 DIAGNOSIS — R4182 Altered mental status, unspecified: Secondary | ICD-10-CM | POA: Diagnosis not present

## 2022-10-28 DIAGNOSIS — H40013 Open angle with borderline findings, low risk, bilateral: Secondary | ICD-10-CM | POA: Diagnosis not present

## 2022-10-28 DIAGNOSIS — E1165 Type 2 diabetes mellitus with hyperglycemia: Secondary | ICD-10-CM | POA: Insufficient documentation

## 2022-10-28 DIAGNOSIS — R739 Hyperglycemia, unspecified: Secondary | ICD-10-CM | POA: Diagnosis not present

## 2022-10-28 DIAGNOSIS — H2512 Age-related nuclear cataract, left eye: Secondary | ICD-10-CM | POA: Diagnosis not present

## 2022-10-28 LAB — CBC
HCT: 38.3 % (ref 36.0–46.0)
Hemoglobin: 12.6 g/dL (ref 12.0–15.0)
MCH: 31.8 pg (ref 26.0–34.0)
MCHC: 32.9 g/dL (ref 30.0–36.0)
MCV: 96.7 fL (ref 80.0–100.0)
Platelets: 165 10*3/uL (ref 150–400)
RBC: 3.96 MIL/uL (ref 3.87–5.11)
RDW: 13.8 % (ref 11.5–15.5)
WBC: 7.2 10*3/uL (ref 4.0–10.5)
nRBC: 0 % (ref 0.0–0.2)

## 2022-10-28 LAB — I-STAT VENOUS BLOOD GAS, ED
Acid-Base Excess: 5 mmol/L — ABNORMAL HIGH (ref 0.0–2.0)
Bicarbonate: 31.9 mmol/L — ABNORMAL HIGH (ref 20.0–28.0)
Calcium, Ion: 1.14 mmol/L — ABNORMAL LOW (ref 1.15–1.40)
HCT: 38 % (ref 36.0–46.0)
Hemoglobin: 12.9 g/dL (ref 12.0–15.0)
O2 Saturation: 36 %
Potassium: 2.6 mmol/L — CL (ref 3.5–5.1)
Sodium: 138 mmol/L (ref 135–145)
TCO2: 34 mmol/L — ABNORMAL HIGH (ref 22–32)
pCO2, Ven: 56.1 mmHg (ref 44–60)
pH, Ven: 7.363 (ref 7.25–7.43)
pO2, Ven: 23 mmHg — CL (ref 32–45)

## 2022-10-28 LAB — BASIC METABOLIC PANEL
Anion gap: 10 (ref 5–15)
Anion gap: 9 (ref 5–15)
BUN: 15 mg/dL (ref 8–23)
BUN: 14 mg/dL (ref 8–23)
CO2: 32 mmol/L (ref 22–32)
CO2: 32 mmol/L (ref 22–32)
Calcium: 9.7 mg/dL (ref 8.9–10.3)
Calcium: 9.4 mg/dL (ref 8.9–10.3)
Chloride: 92 mmol/L — ABNORMAL LOW (ref 98–111)
Chloride: 94 mmol/L — ABNORMAL LOW (ref 98–111)
Creatinine, Ser: 2.11 mg/dL — ABNORMAL HIGH (ref 0.44–1.00)
Creatinine, Ser: 1.95 mg/dL — ABNORMAL HIGH (ref 0.44–1.00)
GFR, Estimated: 25 mL/min — ABNORMAL LOW (ref >60)
GFR, Estimated: 27 mL/min — ABNORMAL LOW (ref >60)
Glucose, Bld: 450 mg/dL — ABNORMAL HIGH (ref 70–99)
Glucose, Bld: 382 mg/dL — ABNORMAL HIGH (ref 70–99)
Potassium: 2.7 mmol/L — CL (ref 3.5–5.1)
Potassium: 3 mmol/L — ABNORMAL LOW (ref 3.5–5.1)
Sodium: 134 mmol/L — ABNORMAL LOW (ref 135–145)
Sodium: 135 mmol/L (ref 135–145)

## 2022-10-28 LAB — CBG MONITORING, ED
Glucose-Capillary: 410 mg/dL — ABNORMAL HIGH (ref 70–99)
Glucose-Capillary: 429 mg/dL — ABNORMAL HIGH (ref 70–99)

## 2022-10-28 LAB — URINALYSIS, ROUTINE W REFLEX MICROSCOPIC
Bilirubin Urine: NEGATIVE
Glucose, UA: >1000 mg/dL — AB
Hgb urine dipstick: NEGATIVE
Ketones, ur: NEGATIVE mg/dL
Leukocytes,Ua: NEGATIVE
Nitrite: NEGATIVE
Protein, ur: NEGATIVE mg/dL
Specific Gravity, Urine: 1.021 (ref 1.005–1.030)
pH: 5.5 (ref 5.0–8.0)

## 2022-10-28 MED ORDER — POTASSIUM CHLORIDE 10 MEQ/100ML IV SOLN
10.0000 meq | Freq: Once | INTRAVENOUS | Status: AC
  Administered 2022-10-28: 10 meq via INTRAVENOUS
  Filled 2022-10-28: qty 100

## 2022-10-28 MED ORDER — LACTATED RINGERS IV BOLUS
1000.0000 mL | INTRAVENOUS | Status: AC
  Administered 2022-10-28: 1000 mL via INTRAVENOUS

## 2022-10-28 MED ORDER — INSULIN ASPART 100 UNIT/ML IJ SOLN
8.0000 [IU] | Freq: Once | INTRAMUSCULAR | Status: AC
  Administered 2022-10-28: 8 [IU] via SUBCUTANEOUS

## 2022-10-28 MED ORDER — POTASSIUM CHLORIDE CRYS ER 20 MEQ PO TBCR: 20.0000 meq | EXTENDED_RELEASE_TABLET | Freq: Two times a day (BID) | ORAL | 0 refills | Status: DC

## 2022-10-28 MED ORDER — POTASSIUM CHLORIDE CRYS ER 20 MEQ PO TBCR
40.0000 meq | EXTENDED_RELEASE_TABLET | Freq: Once | ORAL | Status: AC
  Administered 2022-10-28: 40 meq via ORAL
  Filled 2022-10-28: qty 2

## 2022-10-28 NOTE — Telephone Encounter (Signed)
Pt's daughter called to confirm whether or not her mom actually spoke to a triage nurse today. Blood sugar is in the 400-500's and it is not under control. The pt is very lethargic and not able to hold a conversation so she isn't sure if she did speak to anyone. Advised daughter no record of a call with triage. Daughter is concerned so transferred her to triage to try and describe what she has noticed about her mom.

## 2022-10-28 NOTE — ED Provider Notes (Addendum)
[facility] Provider Note   CSN: [id number] Arrival date & time: 10/28/22  1649     History  Chief Complaint  Patient presents with   Hyperglycemia    Sue [name] is a 72 y.o. female.   Hyperglycemia    Patient has a history of hypertension depression reflux stroke emphysema, diabetes, obesity.  Patient presents to the ED for having issues with her blood sugar for the past week.  Blood sugar has been labile going up and down.  Patient did not complain of any other issues but family feels like it is hard for her to form complete sentences and she seems listless.  They called the doctor's office and they suggested she come to the ED.  Symptoms have been ongoing for couple of days.  Patient denies any headache.  She denies any focal numbness or weakness.  No abdominal pain.  No dysuria although she is having urinary frequency.  She denies any alcohol or drug use.  No change in her medications  Home Medications Prior to Admission medications   Medication Sig Start Date End Date Taking? Authorizing Provider  potassium chloride SA (KLOR-CON M) 20 MEQ tablet Take 1 tablet (20 mEq total) by mouth 2 (two) times daily. 10/28/22  Yes Dorie Rank, MD  Accu-Chek Softclix Lancets lancets USE 1  TO CHECK GLUCOSE 4 TIMES DAILY 06/27/22   Carollee Herter, Alferd Apa, DO  allopurinol (ZYLOPRIM) 100 MG tablet Take 100 mg by mouth daily as needed (for gout flareups). 06/10/21   [provider]  blood glucose meter kit and supplies KIT Dispense based on patient and insurance preference. Use up to four times daily as directed. (FOR ICD-9 250.00, 250.01). 12/08/20   Carollee Herter, Alferd Apa, DO  busPIRone (BUSPAR) 15 MG tablet Take 1 tablet (15 mg total) by mouth 2 (two) times daily. 10/24/22   Elwanda Brooklyn, NP  Dulaglutide (TRULICITY) 1.5 0000000 SOPN Inject 1.5 mg into the skin once a week. 04/29/22   Shamleffer, Melanie Crazier, MD  famotidine (PEPCID) 20 MG  tablet Take 20 mg by mouth daily as needed for heartburn or indigestion.    [provider]  gabapentin (NEURONTIN) 100 MG capsule 1-2 po qhs Patient taking differently: Take 100 mg by mouth at bedtime. 03/14/22   Roma Schanz R, DO  glucose blood (ACCU-CHEK GUIDE) test strip USE 1 STRIP TO CHECK 4 TIMES DAILY 08/15/22   Carollee Herter, Alferd Apa, DO  ibuprofen (ADVIL) 800 MG tablet Take 800 mg by mouth every 8 (eight) hours as needed for headache. 07/28/22   [provider]  insulin glargine, 1 Unit Dial, (TOUJEO SOLOSTAR) 300 UNIT/ML Solostar Pen Inject 64 Units into the skin daily in the afternoon. 04/29/22   Shamleffer, Melanie Crazier, MD  Insulin Pen Needle 29G X 8MM MISC 1 Device by Does not apply route daily in the afternoon. 04/29/22   Shamleffer, Melanie Crazier, MD  levothyroxine (SYNTHROID) 88 MCG tablet Take 1 tablet (88 mcg total) by mouth daily. 04/29/22   Shamleffer, Melanie Crazier, MD  LORazepam (ATIVAN) 1 MG tablet TAKE 1/2 TO 1 AND 1/2 TABLETS AT BEDTIME AND AN ADDITIONAL 1/2 TAB ONCE A DAY AS NEEDED FOR AXIETY 10/24/22   Elwanda Brooklyn, NP  Magnesium 400 MG CAPS Take 1 capsule by mouth daily. 01/14/20   Mosie Lukes, MD  metoprolol succinate (TOPROL-XL) 50 MG 24 hr tablet Take 2 tablets (100 mg total) by mouth daily. Take  with or immediately following a meal 08/30/22   Carollee Herter, Alferd Apa, DO  OXYGEN Inhale 3-4 L/min into the lungs continuous.     [provider]  QUEtiapine (SEROQUEL) 200 MG tablet Take 1 tablet (200 mg total) by mouth at bedtime. 10/24/22   Elwanda Brooklyn, NP  rosuvastatin (CRESTOR) 10 MG tablet Take 1 tablet (10 mg total) by mouth daily. For heart health and to lower cholesterol. 08/23/22   Ann Held, DO  torsemide (DEMADEX) 20 MG tablet Take 3 tablets by mouth once daily 09/20/22   Carollee Herter, Alferd Apa, DO  zolpidem (AMBIEN CR) 12.5 MG CR tablet Take 1 tablet (12.5 mg total) by mouth at bedtime as needed. for sleep 10/24/22    Elwanda Brooklyn, NP      Allergies    Hydrocodone, Norvasc [amlodipine besylate], and Tizanidine    Review of Systems   Review of Systems  Physical Exam Updated Vital Signs BP 137/82   Pulse 86   Temp 97.9 F (36.6 C) (Oral)   Resp (!) 21   SpO2 97%  Physical Exam Vitals and nursing note reviewed.  Constitutional:      Appearance: She is well-developed. She is not diaphoretic.  HENT:     Head: Normocephalic and atraumatic.     Right Ear: External ear normal.     Left Ear: External ear normal.  Eyes:     General: No visual field deficit or scleral icterus.       Right eye: No discharge.        Left eye: No discharge.     Conjunctiva/sclera: Conjunctivae normal.  Neck:     Trachea: No tracheal deviation.  Cardiovascular:     Rate and Rhythm: Normal rate and regular rhythm.  Pulmonary:     Effort: Pulmonary effort is normal. No respiratory distress.     Breath sounds: Normal breath sounds. No stridor. No wheezing or rales.  Abdominal:     General: Bowel sounds are normal. There is no distension.     Palpations: Abdomen is soft.     Tenderness: There is no abdominal tenderness. There is no guarding or rebound.  Musculoskeletal:        General: No tenderness.     Cervical back: Neck supple.  Skin:    General: Skin is warm and dry.     Findings: No rash.  Neurological:     Mental Status: She is alert and oriented to person, place, and time.     GCS: GCS eye subscore is 4. GCS verbal subscore is 5. GCS motor subscore is 6.     Cranial Nerves: No cranial nerve deficit, dysarthria or facial asymmetry.     Sensory: No sensory deficit.     Motor: No abnormal muscle tone, seizure activity or pronator drift.     Coordination: Coordination normal.     Comments:  able to hold both legs off bed for 5 seconds, sensation intact in all extremities,  no left or right sided neglect, normal finger-nose exam bilaterally, no nystagmus noted   Psychiatric:        Mood and Affect: Mood  normal.     ED Results / Procedures / Treatments   Labs (all labs ordered are listed, but only abnormal results are displayed) Labs Reviewed  BASIC METABOLIC PANEL - Abnormal; Notable for the following components:      Result Value   Sodium 134 (*)    Potassium 2.7 (*)  Chloride 92 (*)    Glucose, Bld 450 (*)    Creatinine, Ser 2.11 (*)    GFR, Estimated 25 (*)    All other components within normal limits  URINALYSIS, ROUTINE W REFLEX MICROSCOPIC - Abnormal; Notable for the following components:   Glucose, UA >1,000 (*)    Bacteria, UA RARE (*)    All other components within normal limits  BASIC METABOLIC PANEL - Abnormal; Notable for the following components:   Potassium 3.0 (*)    Chloride 94 (*)    Glucose, Bld 382 (*)    Creatinine, Ser 1.95 (*)    GFR, Estimated 27 (*)    All other components within normal limits  CBG MONITORING, ED - Abnormal; Notable for the following components:   Glucose-Capillary 410 (*)    All other components within normal limits  CBG MONITORING, ED - Abnormal; Notable for the following components:   Glucose-Capillary 429 (*)    All other components within normal limits  I-STAT VENOUS BLOOD GAS, ED - Abnormal; Notable for the following components:   pO2, Ven 23 (*)    Bicarbonate 31.9 (*)    TCO2 34 (*)    Acid-Base Excess 5.0 (*)    Potassium 2.6 (*)    Calcium, Ion 1.14 (*)    All other components within normal limits  CBC  OSMOLALITY  CBG MONITORING, ED  CBG MONITORING, ED    EKG None  Radiology CT Head Wo Contrast  Result Date: 10/28/2022 CLINICAL DATA:  Mental status change EXAM: CT HEAD WITHOUT CONTRAST TECHNIQUE: Contiguous axial images were obtained from the base of the skull through the vertex without intravenous contrast. RADIATION DOSE REDUCTION: This exam was performed according to the departmental dose-optimization program which includes automated exposure control, adjustment of the mA and/or kV according to patient  size and/or use of iterative reconstruction technique. COMPARISON:  CT brain 09/01/2022, MRI 01/01/2020 FINDINGS: Brain: No acute territorial infarction, hemorrhage or intracranial mass. Mild atrophy. Nonenlarged ventricles. Vascular: No hyperdense vessels.  Carotid vascular calcification. Skull: Normal. Negative for fracture or focal lesion. Sinuses/Orbits: Old fracture deformity medial wall left orbit. Moderate mucosal thickening in the right maxillary sinus with scattered calcific deposits. Other: None. IMPRESSION: No CT evidence for acute intracranial abnormality. Mild atrophy. Electronically Signed   By: Donavan Foil M.D.   On: 10/28/2022 18:27   DG Chest Portable 1 View  Result Date: 10/28/2022 CLINICAL DATA:  Elevated blood sugar. EXAM: PORTABLE CHEST 1 VIEW COMPARISON:  09/01/2022 FINDINGS: The heart size and mediastinal contours are within normal limits. Both lungs are clear. The visualized skeletal structures are unremarkable. IMPRESSION: No acute cardiopulmonary disease. Electronically Signed   By: Jill Side M.D.   On: 10/28/2022 18:01    Procedures .Critical Care  Performed by: Dorie Rank, MD Authorized by: Dorie Rank, MD   Critical care provider statement:    Critical care time (minutes):  30   Critical care was time spent personally by me on the following activities:  Development of treatment plan with patient or surrogate, discussions with consultants, evaluation of patient's response to treatment, examination of patient, ordering and review of laboratory studies, ordering and review of radiographic studies, ordering and performing treatments and interventions, pulse oximetry, re-evaluation of patient's condition and review of old charts     Medications Ordered in ED Medications  lactated ringers bolus 1,000 mL (0 mLs Intravenous Stopped 10/28/22 2010)  potassium chloride SA (KLOR-CON M) CR tablet 40 mEq (40 mEq Oral Given  10/28/22 1906)  potassium chloride 10 mEq in 100 mL IVPB  (0 mEq Intravenous Stopped 10/28/22 2010)  insulin aspart (novoLOG) injection 8 Units (8 Units Subcutaneous Given 10/28/22 1956)    ED Course/ Medical Decision Making/ A&P Clinical Course as of 10/28/22 2239  Fri Oct 28, 2022  1859 Labs reviewed.  Metabolic panel shows hyperglycemia, stable renal insufficiency and hypokalemia [JK]  1900 Venous blood gas without any acidosis.  CBC normal.  Urinalysis does not suggest infection. [JK]  1900 CT without acute findings.  Chest x-ray without acute findings [JK]  1942 Serum replacement initiated.  Will give a dose of insulin considering her persistent hyperglycemia [JK]  99991111 Basic metabolic panel(!) Metabolic panel shows glucose is decreasing to 382.  Potassium decreased to 3.0 [JK]    Clinical Course User Index [JK] Dorie Rank, MD                             Medical Decision Making Frontal diagnosis includes but not limited to metabolic derangement, hypokalemia hypoglycemia, stroke, infection  Problems Addressed: Hyperglycemia: acute illness or injury that poses a threat to life or bodily functions Hypokalemia: acute illness or injury that poses a threat to life or bodily functions  Amount and/or Complexity of Data Reviewed Labs: ordered. Decision-making details documented in ED Course. Radiology: ordered and independent interpretation performed.  Risk Prescription drug management.   Presented to the ED for evaluation of generalized weakness.  Patient's laboratory tests were notable for significant hypokalemia and hyperglycemia.  Patient was treated with IV fluids and potassium replacement.  Patient noted improvement in her symptoms.  CT scan does not show any acute abnormalities.  Patient has no focal deficits.  I doubt stroke or TIA.  Patient's blood sugar is decreasing although still hyperglycemic.  No evidence to suggest DKA or hyperosmolar syndrome.  Patient states she has only been taking her sliding scale insulin once a day.  Will  have her increase that to 3 times daily.  She will continue her other home diabetes medications.  I will also have her take potassium supplementation.  Discussed close outpatient follow-up with primary care doctor.  Warning signs precautions discussed.        Final Clinical Impression(s) / ED Diagnoses Final diagnoses:  Hyperglycemia  Hypokalemia    Rx / DC Orders ED Discharge Orders          Ordered    potassium chloride SA (KLOR-CON M) 20 MEQ tablet  2 times daily        10/28/22 2237              Dorie Rank, MD 10/28/22 2239    Dorie Rank, MD 10/28/22 2239

## 2022-10-28 NOTE — ED Triage Notes (Signed)
Pt reports hyperglycemia x 1 week. PT also reports she is having trouble with her speech. PT stating she feels that her words are slurred. Pt reports that has been going on for 2 days.

## 2022-10-28 NOTE — ED Notes (Signed)
VBG completed and handed to Dr. Tomi Bamberger.

## 2022-10-28 NOTE — Telephone Encounter (Signed)
FYI. Pt's daughter will take patient to the ED

## 2022-10-28 NOTE — ED Notes (Signed)
Date and time results received: 10/28/22 1815 (use smartphrase ".now" to insert current time)  Test: K+ Critical Value: 2.7  Name of Provider Notified: Dr. Tomi Bamberger  Orders Received? Or Actions Taken?: see chart

## 2022-10-28 NOTE — Telephone Encounter (Signed)
Initial Comment Caller states she is calling for her mother, she states her blood sugar is in the 400 and 500 and she is lethargic ( not able to have a conversation). Translation No Nurse Assessment Nurse: Astrid Divine, RN, Varney Biles Date/Time Eilene Ghazi Time): 10/28/2022 2:16:05 PM Confirm and document reason for call. If symptomatic, describe symptoms. ---Caller states her mothers blood sugars have been running high for over a week. Pt is lethargic, not able to make sentences. Pt reports she had spoke to PCP office staff, but she did not per daughter. Last bs 450 in AM. Dtr unsure if pt has taken medications. Does the patient have any new or worsening symptoms? ---Yes Will a triage be completed? ---Yes Related visit to physician within the last 2 weeks? ---No Does the PT have any chronic conditions? (i.e. diabetes, asthma, this includes High risk factors for pregnancy, etc.) ---Yes List chronic conditions. ---Diabetes, COPD, CHF Is this a behavioral health or substance abuse call? ---No Guidelines Guideline Title Affirmed Question Affirmed Notes Nurse Date/Time (Eastern Time) Diabetes - High Blood Sugar Acting confused (e.g., disoriented, slurred speech) Astrid Divine, RN, Varney Biles 10/28/2022 2:19:05 PM Disp. Time Eilene Ghazi Time) Disposition Final User 10/28/2022 2:14:01 PM Send to Urgent Casey Burkitt, Dewaine Oats PLEASE NOTE: All timestamps contained within this report are represented as [place] Standard Time. CONFIDENTIALTY NOTICE: This fax transmission is intended only for the addressee. It contains information that is legally privileged, confidential or otherwise protected from use or disclosure. If you are not the intended recipient, you are strictly prohibited from reviewing, disclosing, copying using or disseminating any of this information or taking any action in reliance on or regarding this information. If you have received this fax in error, please notify us immediately by telephone so  that we can arrange for its return to [place]. Phone: [phone], Toll-Free: [phone], Fax: [phone] Page: 2 of 2 Call Id: [id number] [employer]. Time Eilene Ghazi Time) Disposition Final User 10/28/2022 2:20:50 PM Call EMS 911 Now Yes Astrid Divine, RN, Varney Biles Final Disposition 10/28/2022 2:20:50 PM Call EMS 911 Now Yes Astrid Divine, RN, Varney Biles Caller Disagree/Comply Comply Caller Understands Yes PreDisposition InappropriateToAsk Care Advice Given Per Guideline CALL EMS 911 NOW: * Immediate medical attention is needed. You need to hang up and call 911 (or an ambulance). CARE ADVICE given per Diabetes - High Blood Sugar (Adult) guideline. * Hyperglycemia is not as serious a condition. Comments User: Lorraine Lax, RN Date/Time Eilene Ghazi Time): 10/28/2022 2:22:44 PM Spoke with dtr due to pt unable to have conversation at this time. Dtr reports pt is really confused due to high bs. Instructed caller that pt needs to call 911 now. Dtr reports pt is currently at the eye dr and she will take her to ED Referrals [facility] - ED

## 2022-10-28 NOTE — Discharge Instructions (Addendum)
Increase your sliding scale insulin from 1 times a day to 3 times daily.  Take the potassium medication as prescribed.  Follow-up with your primary care doctor to have your potassium and blood sugar rechecked.  Return as needed for worsening symptoms

## 2022-10-29 LAB — OSMOLALITY: Osmolality: 316 mOsm/kg — ABNORMAL HIGH (ref 275–295)

## 2022-11-01 ENCOUNTER — Telehealth: Payer: Self-pay

## 2022-11-01 NOTE — Telephone Encounter (Signed)
     Patient  visit on 2/16  at [facility]    Patient has a follow up visit this week The patient was or was not able to obtain any needed medicine or equipment.yes   Are there diet recommendations that you are having difficulty following?na   Patient expresses understanding of discharge instructions and education provided has no other needs at this time.  Yes      [facility] [phone] [address], [place], [place] [address] Phone: [phone] Email: [email or web address]

## 2022-11-02 ENCOUNTER — Telehealth: Payer: Self-pay | Admitting: Family Medicine

## 2022-11-02 ENCOUNTER — Ambulatory Visit: Payer: Medicare HMO | Admitting: Internal Medicine

## 2022-11-02 NOTE — Telephone Encounter (Signed)
Initial Comment Caller states her mom has low potassium and keeps declining. Her sugar was high yesterday but it hasn't been taken yet today. She has been laying around and seems depressed. When someone talks to her she responds with a different answer and isn't responding like she normal would. Like she is confused, and she turns her head away. Translation No Nurse Assessment Nurse: D'Heur Lucia Gaskins, RN, Adrienne Date/Time (Eastern Time): 11/02/2022 1:28:12 PM Confirm and document reason for call. If symptomatic, describe symptoms. ---Caller states her mom has low potassium and keeps declining. Her sugar was high yesterday. She has been laying around and seems depressed. When someone talks to her she responds with a different answer and isn't responding like she normal would. She is confused, and she turns her head away. Symptoms have been going on for a week. She was seen two days ago. Current O2SAT is 93%, pulse is 84. BP is 117/65. Does the patient have any new or worsening symptoms? ---Yes Will a triage be completed? ---Yes Related visit to physician within the last 2 weeks? ---Yes Does the PT have any chronic conditions? (i.e. diabetes, asthma, this includes High risk factors for pregnancy, etc.) ---Yes List chronic conditions. ---diabetes, anxiety, depression, COPD, CHF, HTN Is this a behavioral health or substance abuse call? ---No PLEASE NOTE: All timestamps contained within this report are represented as [place] Standard Time. CONFIDENTIALTY NOTICE: This fax transmission is intended only for the addressee. It contains information that is legally privileged, confidential or otherwise protected from use or disclosure. If you are not the intended recipient, you are strictly prohibited from reviewing, disclosing, copying using or disseminating any of this information or taking any action in reliance on or regarding this information. If you have received this fax in error,  please notify us immediately by telephone so that we can arrange for its return to [place]. Phone: [phone], Toll-Free: [phone], Fax: [phone] Page: 2 of 3 Call Id: [id number] Guidelines Guideline Title Affirmed Question Affirmed Notes Nurse Date/Time Eilene Ghazi Time) Confusion - Delirium [1] Acting confused (e.g., disoriented, slurred speech) AND [2] brief (now gone) D'Heur Lucia Gaskins, RN, [place] 11/02/2022 1:40:23 PM Diabetes - High Blood Sugar Blood glucose > 400 mg/dL (22.2 mmol/L) D'Heur Lucia Gaskins, RN, [place]rien[place] 11/02/2022 1:45:33 PM Disp. Time Eilene Ghazi Time) Disposition Final User 11/02/2022 1:25:13 PM Send to Urgent Queue Jaynie Crumble 11/02/2022 1:45:01 PM See HCP within 4 Hours (or PCP triage) D'Heur Lucia Gaskins, RN, [place] 11/02/2022 1:47:33 PM Call PCP Now Yes D'Heur Lucia Gaskins, RN, [place] Final Disposition 11/02/2022 1:47:33 PM Call PCP Now Yes D'Heur Lucia Gaskins, RN, Ebbie Latus Disagree/Comply Comply Caller Understands Yes PreDisposition Call Doctor Care Advice Given Per Guideline SEE HCP (OR PCP TRIAGE) WITHIN 4 HOURS: * IF OFFICE WILL BE OPEN: You need to be seen within the next 3 or 4 hours. Call your doctor (or NP/PA) now or as soon as the office opens. CALL BACK IF: * You become worse CARE ADVICE given per Confusion-Delirium (Adult) guideline. CALL PCP NOW: CALL BACK IF: * You become worse CARE ADVICE given per Diabetes - High Blood Sugar (Adult) guideline. Comments User: Vincente [facility], D'Heur Lucia Gaskins, RN Date/Time Eilene Ghazi Time): 11/02/2022 1:43:16 PM Current BG is 440. User: Vincente [facility], D'Heur Lucia Gaskins, RN Date/Time Eilene Ghazi Time): 11/02/2022 1:50:21 PM As per Tonette Bihari in office, patient should go back to ED. Advised family of office recommendation. They will take her back to ED. Referrals REFERRED TO PCP OFFICE

## 2022-11-02 NOTE — Telephone Encounter (Signed)
Pt's daughter states her blood sigar was running around 400-500 and went to the ED. After leaving the ED her blood sugar was better but stated her mom seems "off", not communicating or connecting like normal. She asked to speak to a triage nurse and was transferred.

## 2022-11-02 NOTE — Telephone Encounter (Signed)
Spoke w/ Triage nurse- Pt having confusion and blood sugars of 440. Informed RN to instruct Pt to go back to ED for evaluation.

## 2022-11-02 NOTE — Telephone Encounter (Signed)
FYI. Instructed Pt to go back to ED.

## 2022-11-02 NOTE — Progress Notes (Deleted)
Name: Sue [name]  MRN/ DOB: [id number], 1951-06-09   Age/ Sex: 72 y.o., female    PCP: Carollee Herter, Alferd Apa, DO   Reason for Endocrinology Evaluation: Type 2 Diabetes Mellitus/ Hypothyroidism     Date of Initial Endocrinology Visit: 05/18/2021    PATIENT IDENTIFIER: Sue [name] is a 72 y.o. female with a past medical history of T2DM, HTN, COPD and Hypothyroidism . The patient presented for initial endocrinology clinic visit on 05/18/2021 for consultative assistance with her diabetes management.    HPI: Sue [name] is accompanied by her daughter [name]    Diagnosed with DM years ago  Prior Medications tried/Intolerance: Restart metformin due to low GFR, started Trulicity 123456 Hemoglobin A1c has ranged from 6.7% in 2020, peaking at 10.6% in 2019.   On her initial visit to our clinic she had an A1c of 7.5%, she was on metformin, glipizide, and Lantus.  We stopped metformin due to low GFR, as well as glipizide.  We continued basal insulin and started Trulicity   THYROID HISTORY: She was diagnosed with hypothyroidism for years. Has been on LT-4 replacement  No proir radiation exposure or sx     SUBJECTIVE:   During the last visit (04/29/2022): A1c 7.7%   Today (11/02/22): Sue [name] is here for a follow up on diabetes and thyroid management.  She is accompanied by her granddaughter. checks her blood sugars 1 times daily. The patient has not had hypoglycemic episodes since the last clinic visit  She follows with neurology for peripheral neuropathy She follows with behavioral health for bipolar affective disorder Patient presented to the ED on 10/28/2022 for hyperglycemia, serum glucose 382 mg/DL, was noted with hypokalemia.  She was put on KCl, patient is on torsemide   Denies nausea , vomiting  She has diarrhea alternating with constipation that she attributes to IBS/diverticulitis    HOME DIABETES REGIMEN: Toujeo 64 units daily Trulicity 1.5 mg  weekly Levothyroxine 88 mcg daily      Statin: no  ACE-I/ARB: yes Prior Diabetic Education: no   METER DOWNLOAD SUMMARY: Did not bring     DIABETIC COMPLICATIONS: Microvascular complications:  CKD III, neuropathy Denies: retinopathy  Last eye exam: Completed 2022  Macrovascular complications:   Denies: CAD, PVD, CVA   PAST HISTORY: Past Medical History:  Past Medical History:  Diagnosis Date   Allergic rhinitis    Depression    Diabetes mellitus type 2, uncontrolled    Emphysema of lung ([place])    3L home O2   GERD (gastroesophageal reflux disease)    Hypertension    Hypothyroidism    Obesity, morbid, BMI 50 or higher ([place])    Stroke ([place]) 2016   TIA    Urine incontinence    Past Surgical History:  Past Surgical History:  Procedure Laterality Date   ABDOMINAL HYSTERECTOMY     CESAREAN SECTION     COLONOSCOPY N/A 08/19/2019   Procedure: COLONOSCOPY;  Surgeon: Clarene Essex, MD;  [facility]: [facility];  Service: Endoscopy;  Laterality: N/A;   COLONOSCOPY WITH PROPOFOL N/A 08/05/2019   Procedure: COLONOSCOPY WITH PROPOFOL;  Surgeon: Wilford Corner, MD;  [facility];  Service: Gastroenterology;  Laterality: N/A;   COLONOSCOPY WITH PROPOFOL N/A 11/19/2019   Procedure: COLONOSCOPY WITH PROPOFOL;  Surgeon: Ronald Lobo, MD;  [facility];  Service: Endoscopy;  Laterality: N/A;  Unprepped   COLONOSCOPY WITH PROPOFOL N/A 11/22/2019   Procedure: COLONOSCOPY WITH PROPOFOL;  Surgeon: Ronald Lobo, MD;  [facility];  Service: Endoscopy;  Laterality: N/A;   COLONOSCOPY WITH PROPOFOL N/A 11/23/2019   Procedure: COLONOSCOPY WITH PROPOFOL;  Surgeon: [name], MD;  [facility];  Service: Gastroenterology;  Laterality: N/A;   COLONOSCOPY WITH PROPOFOL N/A 11/29/2019   Procedure: COLONOSCOPY WITH PROPOFOL;  Surgeon: Wilford Corner, MD;  [facility];  Service: Endoscopy;  Laterality: N/A;   ENTEROSCOPY N/A 11/24/2019    Procedure: ENTEROSCOPY;  Surgeon: [name], MD;  [facility];  Service: Gastroenterology;  Laterality: N/A;   ENTEROSCOPY N/A 11/27/2019   Procedure: ENTEROSCOPY;  Surgeon: Wilford Corner, MD;  [facility];  Service: Endoscopy;  Laterality: N/A;   ESOPHAGOGASTRODUODENOSCOPY N/A 11/27/2019   Procedure: ESOPHAGOGASTRODUODENOSCOPY (EGD);  Surgeon: Wilford Corner, MD;  [facility];  Service: Endoscopy;  Laterality: N/A;   ESOPHAGOGASTRODUODENOSCOPY (EGD) WITH PROPOFOL N/A 11/24/2019   Procedure: ESOPHAGOGASTRODUODENOSCOPY (EGD) WITH PROPOFOL;  Surgeon: [name], MD;  [facility];  Service: Gastroenterology;  Laterality: N/A;  PUSH enteroscopy   GIVENS CAPSULE STUDY N/A 11/19/2019   Procedure: GIVENS CAPSULE STUDY;  Surgeon: Ronald Lobo, MD;  [facility];  Service: Endoscopy;  Laterality: N/A;  To be performed immediately following colonoscopy   GIVENS CAPSULE STUDY N/A 11/24/2019   Procedure: GIVENS CAPSULE STUDY;  Surgeon: [name], MD;  [facility];  Service: Gastroenterology;  Laterality: N/A;   GIVENS CAPSULE STUDY N/A 11/28/2019   Procedure: GIVENS CAPSULE STUDY;  Surgeon: Wilford Corner, MD;  [facility];  Service: Endoscopy;  Laterality: N/A;   HEMOSTASIS CLIP PLACEMENT  11/19/2019   Procedure: HEMOSTASIS CLIP PLACEMENT;  Surgeon: Ronald Lobo, MD;  [facility];  Service: Endoscopy;;   HEMOSTASIS CLIP PLACEMENT  11/22/2019   Procedure: HEMOSTASIS CLIP PLACEMENT;  Surgeon: Ronald Lobo, MD;  [facility];  Service: Endoscopy;;   HOT HEMOSTASIS N/A 11/24/2019   Procedure: HOT HEMOSTASIS (ARGON PLASMA COAGULATION/BICAP);  Surgeon: [name], MD;  [facility];  Service: Gastroenterology;  Laterality: N/A;   HOT HEMOSTASIS N/A 11/27/2019   Procedure: HOT HEMOSTASIS (ARGON PLASMA COAGULATION/BICAP);  Surgeon: Wilford Corner, MD;  [facility];  Service:  Endoscopy;  Laterality: N/A;   SUBMUCOSAL TATTOO INJECTION  11/19/2019   Procedure: SUBMUCOSAL TATTOO INJECTION;  Surgeon: Ronald Lobo, MD;  [facility];  Service: Endoscopy;;    Social History:  reports that she quit smoking about 11 years ago. Her smoking use included cigarettes. She started smoking about 50 years ago. She has a 40.00 pack-year smoking history. She has never used smokeless tobacco. She reports that she does not currently use alcohol. She reports that she does not use drugs. Family History:  Family History  Problem Relation Age of Onset   Heart disease Father        MVP and Pics Valve   Hypertension Father    Depression Father        Institutionalized x's 2 years   Bipolar disorder Father    Hypertension Sister    Diabetes Sister    Hyperlipidemia Sister    Heart disease Sister 71       MI   Heart disease Brother    Hypertension Brother    Schizophrenia Paternal Aunt    Depression Paternal Aunt    Anxiety disorder Paternal Aunt    Heart disease Paternal Aunt    Schizophrenia Paternal Aunt    Heart disease Paternal Uncle    Heart disease Paternal Grandmother    Asthma Son    Asthma Son  HOME MEDICATIONS: Allergies as of 11/02/2022       Reactions   Hydrocodone Other (See Comments)   Constipation and hallucinations   Norvasc [amlodipine Besylate] Swelling, Other (See Comments)   Marked swelling of the limbs   Tizanidine Other (See Comments)   After the 3rd dose, the patient's mouth began to feel numb and she felt like she was a having a "hot flash"        Medication List        Accurate as of November 02, 2022  6:51 AM. If you have any questions, ask your nurse or doctor.          Accu-Chek Guide test strip Generic drug: glucose blood USE 1 STRIP TO CHECK 4 TIMES DAILY   Accu-Chek Softclix Lancets lancets USE 1  TO CHECK GLUCOSE 4 TIMES DAILY   allopurinol 100 MG tablet Commonly known as: ZYLOPRIM Take 100 mg by mouth  daily as needed (for gout flareups).   blood glucose meter kit and supplies Kit Dispense based on patient and insurance preference. Use up to four times daily as directed. (FOR ICD-9 250.00, 250.01).   busPIRone 15 MG tablet Commonly known as: BUSPAR Take 1 tablet (15 mg total) by mouth 2 (two) times daily.   famotidine 20 MG tablet Commonly known as: PEPCID Take 20 mg by mouth daily as needed for heartburn or indigestion.   gabapentin 100 MG capsule Commonly known as: NEURONTIN 1-2 po qhs What changed:  how much to take how to take this when to take this additional instructions   ibuprofen 800 MG tablet Commonly known as: ADVIL Take 800 mg by mouth every 8 (eight) hours as needed for headache.   Insulin Pen Needle 29G X 8MM Misc 1 Device by Does not apply route daily in the afternoon.   levothyroxine 88 MCG tablet Commonly known as: SYNTHROID Take 1 tablet (88 mcg total) by mouth daily.   LORazepam 1 MG tablet Commonly known as: ATIVAN TAKE 1/2 TO 1 AND 1/2 TABLETS AT BEDTIME AND AN ADDITIONAL 1/2 TAB ONCE A DAY AS NEEDED FOR AXIETY   Magnesium 400 MG Caps Take 1 capsule by mouth daily.   metoprolol succinate 50 MG 24 hr tablet Commonly known as: TOPROL-XL Take 2 tablets (100 mg total) by mouth daily. Take with or immediately following a meal   OXYGEN Inhale 3-4 L/min into the lungs continuous.   potassium chloride SA 20 MEQ tablet Commonly known as: KLOR-CON M Take 1 tablet (20 mEq total) by mouth 2 (two) times daily.   QUEtiapine 200 MG tablet Commonly known as: SEROQUEL Take 1 tablet (200 mg total) by mouth at bedtime.   rosuvastatin 10 MG tablet Commonly known as: Crestor Take 1 tablet (10 mg total) by mouth daily. For heart health and to lower cholesterol.   torsemide 20 MG tablet Commonly known as: DEMADEX Take 3 tablets by mouth once daily   Toujeo SoloStar 300 UNIT/ML Solostar Pen Generic drug: insulin glargine (1 Unit Dial) Inject 64 Units  into the skin daily in the afternoon.   Trulicity 1.5 0000000 Sopn Generic drug: Dulaglutide Inject 1.5 mg into the skin once a week.   zolpidem 12.5 MG CR tablet Commonly known as: AMBIEN CR Take 1 tablet (12.5 mg total) by mouth at bedtime as needed. for sleep         ALLERGIES: Allergies  Allergen Reactions   Hydrocodone Other (See Comments)    Constipation and hallucinations   Norvasc [Amlodipine  Besylate] Swelling and Other (See Comments)    Marked swelling of the limbs   Tizanidine Other (See Comments)    After the 3rd dose, the patient's mouth began to feel numb and she felt like she was a having a "hot flash"         OBJECTIVE:   VITAL SIGNS: There were no vitals taken for this visit.   PHYSICAL EXAM:  General: Pt appears well and is in NAD  Lungs: Clear with good BS bilat with no rales, rhonchi, or wheezes  Heart: RRR  Extremities: Trace edema   Neuro: MS is good with appropriate affect, pt is alert and Ox3   DM Foot Exam 18/2023 The skin of the feet is intact without sores or ulcerations. The pedal pulses are undetectable due to edema  The sensation is intact to a screening 5.07, 10 gram monofilament bilaterally    DATA REVIEWED:  Lab Results  Component Value Date   HGBA1C 7.7 (H) 03/14/2022   HGBA1C 7.5 (H) 04/05/2021   HGBA1C 7.7 (H) 02/01/2021    Latest Reference Range & Units 10/28/22 20:57  Sodium 135 - 145 mmol/L 135  Potassium 3.5 - 5.1 mmol/L 3.0 (L)  Chloride 98 - 111 mmol/L 94 (L)  CO2 22 - 32 mmol/L 32  Glucose 70 - 99 mg/dL 382 (H)  BUN 8 - 23 mg/dL 14  Creatinine 0.44 - 1.00 mg/dL 1.95 (H)  Calcium 8.9 - 10.3 mg/dL 9.4  Anion gap 5 - 15  9  GFR, Estimated >60 mL/min 27 (L)  (L): Data is abnormally low (H): Data is abnormally high ASSESSMENT / PLAN / RECOMMENDATIONS:   1) Type 2 Diabetes Mellitus, Sub-Optimally controlled, With CKD III, and neuropathic  complications - Most recent A1c of 7.7 %. Goal A1c < 7.0 %.    - A1c  is stable  - In office BG 171 mg/dL. Per pt home BG's trend to 200 mg/dL at times  - Will increase insulin and Trulicity as below  - Off Metformin due to low GFR   MEDICATIONS: Increase  Toujeo 64 units daily  Increase  Trulicity 1.5 mg ONCE weekly     EDUCATION / INSTRUCTIONS: BG monitoring instructions: Patient is instructed to check her blood sugars 1 times a day, fasting . Call [facility] if: BG persistently < 70  I reviewed the Rule of 15 for the treatment of hypoglycemia in detail with the patient. Literature supplied.   2) Diabetic complications:  Eye: Does not have known diabetic retinopathy.  Neuro/ Feet: Does  have known diabetic peripheral neuropathy. Renal: Patient does  have known baseline CKD. She is  on an ACEI/ARB at present.Check urine albumin/creatinine ratio yearly starting at time of diagnosis.      3) Hypothyroidism :  - She has been off LT-4 replacement. Pt tells me she has not taken it because she is confused about her meds. - I have advised her to take Levothyroxine daily and to use a pill box - I have asked her to get daughter's help to help with medications if possible  - I explained to the pt she is at risk for developing myxedema coma if she is not on sufficient LT-4 replacement  - A refill was sent today  Medication Restart Levothyroxine 88 mcg daily   F/U in 6 months   Signed electronically by: Mack Guise, MD  [facility]  [facility] [address], [place], [place] [address] Phone:  [phone] FAX: [phone]   CC: Ann Held, DO [address] [place] [address] Phone: [phone]  Fax: [phone]    Return to Endocrinology clinic as below: Future Appointments  Date Time Provider [place]  11/02/2022 10:50 AM Ladonya Jerkins, Melanie Crazier, MD [facility] None  11/14/2022 10:00 AM Ann Held, DO [facility]   11/28/2022  2:00 PM Shellia Carwin, MD [facility] None  11/29/2022  3:00 PM Katha Cabal, LCSW [facility] None  12/14/2022 10:30 AM Shellia Carwin, MD [facility] None  04/24/2023  3:30 PM Elwanda Brooklyn, NP CP[facility]CP None

## 2022-11-03 ENCOUNTER — Other Ambulatory Visit: Payer: Self-pay | Admitting: Family Medicine

## 2022-11-03 DIAGNOSIS — E1165 Type 2 diabetes mellitus with hyperglycemia: Secondary | ICD-10-CM

## 2022-11-03 MED ORDER — TRULICITY 3 MG/0.5ML ~~LOC~~ SOAJ: 3.0000 mg | SUBCUTANEOUS | 3 refills | Status: DC

## 2022-11-03 NOTE — Telephone Encounter (Signed)
Called home number and no answer. Called patient's daughter, Quay Burow. She advised that mom did not go back to the hospital and didn't want to too. She states they've been trying to pt's sugar levels down and she has started to come around. Daughter states patient has been crying and talking about death. We discussed reaching out to patient's psych doctor. She advised that there is family member with her currently. Daughter was unable to give me recent glucose levels.

## 2022-11-03 NOTE — Telephone Encounter (Signed)
Daughter called back after speaking with patient. She states her sugars are still in the 500s and feels that when she takes the [employer] her blood sugars are increasing.

## 2022-11-06 ENCOUNTER — Other Ambulatory Visit: Payer: Self-pay | Admitting: Behavioral Health

## 2022-11-06 DIAGNOSIS — F411 Generalized anxiety disorder: Secondary | ICD-10-CM

## 2022-11-09 NOTE — Telephone Encounter (Signed)
Called patient. Unable to to leave VM. Called daughter and left detailed vm regarding increasing medication and Endo's phone number, and new Rx sent

## 2022-11-11 ENCOUNTER — Other Ambulatory Visit: Payer: Self-pay | Admitting: Family Medicine

## 2022-11-13 ENCOUNTER — Other Ambulatory Visit: Payer: Self-pay | Admitting: Behavioral Health

## 2022-11-13 DIAGNOSIS — F5105 Insomnia due to other mental disorder: Secondary | ICD-10-CM

## 2022-11-14 ENCOUNTER — Ambulatory Visit: Payer: Medicare HMO | Admitting: Family Medicine

## 2022-11-28 ENCOUNTER — Telehealth: Payer: Self-pay | Admitting: Neurology

## 2022-11-28 ENCOUNTER — Ambulatory Visit: Payer: Medicare HMO | Admitting: Neurology

## 2022-11-28 DIAGNOSIS — I5032 Chronic diastolic (congestive) heart failure: Secondary | ICD-10-CM

## 2022-11-28 DIAGNOSIS — E1142 Type 2 diabetes mellitus with diabetic polyneuropathy: Secondary | ICD-10-CM

## 2022-11-28 DIAGNOSIS — R269 Unspecified abnormalities of gait and mobility: Secondary | ICD-10-CM

## 2022-11-28 DIAGNOSIS — R209 Unspecified disturbances of skin sensation: Secondary | ICD-10-CM

## 2022-11-28 NOTE — Telephone Encounter (Signed)
Discussed the results of patient's EMG after the procedure today. 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  All questions were answered.  Sue Levins, MD [facility] Neurology

## 2022-11-28 NOTE — Procedures (Signed)
[facility]  [address]  [place], [place] [address] Tel: [phone] Fax: [phone] Test Date:  11/28/2022  Patient: Sue [name] DOB: 12/14/1950 Physician: Kai Levins, MD  Sex: Female Height: 5\' 3"  Ref Phys: Kai Levins, MD  ID#: [id number]   Technician:    History: This is a 72 year old female with numbness and burning in legs and feet.  NCV & EMG Findings: Extensive electrodiagnostic evaluation of the left lower limb with additional nerve conduction studies of the left upper limb shows: Left sural and superficial peroneal/fibular sensory responses are absent. Left median, ulnar, and radial sensory responses are within normal limits. Left peroneal/fibular (EDB), tibial (AH), median (APB), and ulnar (ADM) motor responses are within normal limits. Left H reflex is absent. There is no evidence of active or chronic motor axon loss changes affecting any of the tested muscles on needle examination. Motor unit configuration and recruitment pattern is within normal limits.  Impression: The findings above are most consistent with the following: Absent lower limb sensory responses and H reflex may be normal at this age or represent evidence of a large fiber sensory predominant neuropathy. No electrodiagnostic evidence of a left lumbosacral (L3-S1) motor radiculopathy. No electrodiagnostic evidence of a left median mononeuropathy at or distal to the wrist (ie: carpal tunnel syndrome). Screening studies for left ulnar and radial mononeuropathies are normal.    ___________________________ Kai Levins, MD    Nerve Conduction Studies Motor Nerve Results    Latency Amplitude F-Lat Segment Distance CV Comment  Site (ms) Norm (mV) Norm (ms)  (cm) (m/s) Norm   Left Fibular (EDB) Motor  Ankle 3.4  < 6.0 4.2  > 2.5        Bel fib head 11.3 - 2.9 -  Bel fib head-Ankle 33 42  > 40   Pop fossa 13.3 - 2.8 -  Pop fossa-Bel fib head 8 40 -   Left Median (APB) Motor   Wrist 3.3  < 4.0 7.7  > 5.0        Elbow 8.4 - 7.5 -  Elbow-Wrist 27 53  > 50   Left Tibial (AH) Motor  Ankle 4.3  < 6.0 6.1  > 4.0        Knee 13.9 - 4.4 -  Knee-Ankle 42 44  > 40   Left Ulnar (ADM) Motor  Wrist 2.7  < 3.1 7.5  > 7.0        Bel elbow 7.0 - 6.9 -  Bel elbow-Wrist 23.5 55  > 50   Ab elbow 9.0 - 6.7 -  Ab elbow-Bel elbow 10 50 -    Sensory Sites    Neg Peak Lat Amplitude (O-P) Segment Distance Velocity Comment  Site (ms) Norm (V) Norm  (cm) (ms)   Left Median Sensory  Wrist-Dig II 3.4  < 3.8 19  > 10 Wrist-Dig II 13    Left Radial Sensory  Forearm-Wrist 2.1  < 2.8 10  > 10 Forearm-Wrist 10    Left Superficial Fibular Sensory  14 cm-Ankle *NR  < 4.6 *NR  > 3 14 cm-Ankle 14    Left Sural Sensory  Calf-Lat mall *NR  < 4.6 *NR  > 3 Calf-Lat mall 14    Left Ulnar Sensory  Wrist-Dig V 3.0  < 3.2 10  > 5 Wrist-Dig V 11     H-Reflex Results    M-Lat H Lat H Neg Amp H-M Lat  Site (ms) (ms) Norm (mV) (  ms)  Left Tibial H-Reflex  Pop fossa 6.4 -  < 35.0 - -   Electromyography   Side Muscle Ins.Act Fibs Fasc Recrt Amp Dur Poly Activation Comment  Left Tib ant Nml Nml Nml Nml Nml Nml Nml Nml N/A  Left Gastroc MH Nml Nml Nml Nml Nml Nml Nml Nml N/A  Left FDL Nml Nml Nml Nml Nml Nml Nml Nml N/A  Left Vastus lat Nml Nml Nml Nml Nml Nml Nml Nml N/A  Left Biceps fem SH Nml Nml Nml Nml Nml Nml Nml Nml N/A      Waveforms:  Motor           Sensory             H-Reflex

## 2022-11-29 ENCOUNTER — Ambulatory Visit: Payer: Self-pay | Admitting: Licensed Clinical Social Worker

## 2022-11-29 DIAGNOSIS — Z885 Allergy status to narcotic agent status: Secondary | ICD-10-CM | POA: Diagnosis not present

## 2022-11-29 DIAGNOSIS — E1136 Type 2 diabetes mellitus with diabetic cataract: Secondary | ICD-10-CM | POA: Diagnosis not present

## 2022-11-29 DIAGNOSIS — J449 Chronic obstructive pulmonary disease, unspecified: Secondary | ICD-10-CM | POA: Diagnosis not present

## 2022-11-29 DIAGNOSIS — I509 Heart failure, unspecified: Secondary | ICD-10-CM | POA: Diagnosis not present

## 2022-11-29 DIAGNOSIS — Z7985 Long-term (current) use of injectable non-insulin antidiabetic drugs: Secondary | ICD-10-CM | POA: Diagnosis not present

## 2022-11-29 DIAGNOSIS — H2512 Age-related nuclear cataract, left eye: Secondary | ICD-10-CM | POA: Diagnosis not present

## 2022-11-29 DIAGNOSIS — M109 Gout, unspecified: Secondary | ICD-10-CM | POA: Diagnosis not present

## 2022-11-29 DIAGNOSIS — K219 Gastro-esophageal reflux disease without esophagitis: Secondary | ICD-10-CM | POA: Diagnosis not present

## 2022-11-29 DIAGNOSIS — Z888 Allergy status to other drugs, medicaments and biological substances status: Secondary | ICD-10-CM | POA: Diagnosis not present

## 2022-11-29 DIAGNOSIS — I110 Hypertensive heart disease with heart failure: Secondary | ICD-10-CM | POA: Diagnosis not present

## 2022-11-29 NOTE — Patient Outreach (Signed)
  Care Coordination   Follow Up Visit Note   11/29/2022 Name: Sue [name] MRN: [id number] DOB: 26-Nov-1950  Sue [name] is a 72 y.o. year old female who sees Sue [name], Sue Apa, Sue [name] for primary care. I spoke with  Sue [name] / Sue [name], daughter and contact for client, by phone today.  What matters to the patients health and wellness today?  Patient interested in program support. Would like  to learn more about nursing support and SW support   Goals Addressed             This Visit's Progress    Patient is interested in program support. Would like to learn more about nursing support and SW support       Interventions  LCSW spoke via phone today with Sue [name], daughter and contact for client. Sue [name] said client had cataract procedure today and that client is home resting after cataract procedure Discussed in home care for client. Sue [name] said client has home health aide who helps client weekly in the home as scheduled Sue [name] and LCSW spoke about program support for clinet Discussed medication procurement for client.  Discussed oxygen use of Sue [name]. Sue [name] said client uses oxygen continuously at 4 liters via nasal canula Discussed walking of client. Client uses a walker to help her walk. Talked with Sue [name] about edema issues of client Provided Sue [name] with LCSW name and phone number, [phone]. Encouraged client or Sue [name] to call LCSW as needed for SW support for client. Sue [name] was appreciative of call from LCSW today    SDOH assessments and interventions completed:  Yes  SDOH Interventions Today    Flowsheet Row Most Recent Value  SDOH Interventions   Depression Interventions/Treatment  Medication  Physical Activity Interventions Other (Comments)  [uses a walker to help her walk]  Stress Interventions Other (Comment)  [client has stress related to managing medical needs and daily care needs]       Care Coordination  Interventions:  Yes, provided    Interventions Today    Flowsheet Row Most Recent Value  Chronic Disease   Chronic disease during today's visit Other  [LCSW spoke with Sue [name], daughter of client, about client needs]  General Interventions   General Interventions Discussed/Reviewed General Interventions Discussed, [employer]  [discussed program support]  Exercise Interventions   Exercise Discussed/Reviewed Physical Activity  [uses walker to help her walk]  Physical Activity Discussed/Reviewed Physical Activity Discussed  Education Interventions   Education Provided Provided Education  Provided Verbal Education [place] Discussed/Reviewed Depression  [discussed client use of prescribed medications. client is taking medications as prescribed]  Safety Interventions   Safety Discussed/Reviewed Fall Risk       Follow up plan: Follow up call scheduled for 01/09/23 at 10:30 AM    Encounter Outcome:  Pt. Visit Completed   Norva Riffle.Drayk Humbarger MSW, [place] representative [facility] Care Management [phone]

## 2022-11-29 NOTE — Patient Instructions (Signed)
Visit Information  Thank you for taking time to visit with me today. Please don't hesitate to contact me if I can be of assistance to you.   Following are the goals we discussed today:   Goals Addressed             This Visit's Progress    Patient is interested in program support. Would like to learn more about nursing support and SW support       Interventions  LCSW spoke via phone today with Wallie Renshaw, daughter and contact for client. [name] said client had cataract procedure today and that client is home resting after cataract procedure Discussed in home care for client. [name] said client has home health aide who helps client weekly in the home as scheduled [name] and LCSW spoke about program support for clinet Discussed medication procurement for client.  Discussed oxygen use of [employer] Fant[place]. [name] said client uses oxygen continuously at 4 liters via nasal canula Discussed walking of client. Client uses a walker to help her walk. Talked with [name] about edema issues of client Provided Wallie Renshaw with LCSW name and phone number, [phone]. Encouraged client or Wallie Renshaw to call LCSW as needed for SW support for client. [name] was appreciative of call from LCSW today     Our next appointment is by telephone on 01/09/23 at 10:30 AM   Please call the care guide team at [phone] if you need to cancel or reschedule your appointment.   If you are experiencing a Mental Health or [place] or need someone to talk to, please go to [facility] [place] [phone])   The patient/ Wallie Renshaw, daughter, verbalized understanding of instructions, educational materials, and care plan provided today and DECLINED offer to receive copy of patient instructions, educational materials, and care plan.   The patient / Wallie Renshaw, daughter, has been provided with contact information for the care  management team and has been advised to call with any health related questions or concerns.   Norva Riffle.Abrham Maslowski MSW, [place] representative [facility] Care Management [phone]

## 2022-11-30 ENCOUNTER — Telehealth: Payer: Self-pay | Admitting: Neurology

## 2022-11-30 DIAGNOSIS — H2511 Age-related nuclear cataract, right eye: Secondary | ICD-10-CM | POA: Diagnosis not present

## 2022-11-30 DIAGNOSIS — R209 Unspecified disturbances of skin sensation: Secondary | ICD-10-CM

## 2022-11-30 DIAGNOSIS — E1142 Type 2 diabetes mellitus with diabetic polyneuropathy: Secondary | ICD-10-CM

## 2022-11-30 MED ORDER — PREGABALIN 25 MG PO CAPS: 25.0000 mg | ORAL_CAPSULE | Freq: Two times a day (BID) | ORAL | 5 refills | Status: DC

## 2022-11-30 NOTE — Telephone Encounter (Signed)
Called patient to discuss symptom management. She did not feel gabapentin helped. Patient is on other serotonergic medications, so options like Cymbalta are not available. I will switch patient to Lyrica 25 mg BID.   All questions were answered.  Kai Levins, MD [facility] Neurology

## 2022-12-03 ENCOUNTER — Other Ambulatory Visit: Payer: Self-pay | Admitting: Neurology

## 2022-12-03 DIAGNOSIS — R209 Unspecified disturbances of skin sensation: Secondary | ICD-10-CM

## 2022-12-03 DIAGNOSIS — E1142 Type 2 diabetes mellitus with diabetic polyneuropathy: Secondary | ICD-10-CM

## 2022-12-06 NOTE — Progress Notes (Unsigned)
Virtual Visit Via Video    Consent was obtained for video visit:  Yes.   Answered questions that patient had about telehealth interaction:  Yes.   I discussed the limitations, risks, security and privacy concerns of performing an evaluation and management service by telemedicine. I also discussed with the patient that there may be a patient responsible charge related to this service. The patient expressed understanding and agreed to proceed.  Pt location: Home Physician Location: office Name of referring provider:  Ann Held, * I connected with Sue [name] at patients initiation/request on 12/14/2022 at 10:30 AM EDT by video enabled telemedicine application and verified that I am speaking with the correct person using two identifiers. Pt MRN:  [id number] Pt DOB:  1951/04/14 Video Participants:  Sue [name];  ***  Assessment/Plan:   This is Sue [name], a 72 y.o. female with:  ***  Plan: ***Repeat IFE -Continue Lyrica 25 mg BID*** -Lidocaine cream PRN***  Return to clinic in ***  Subjective   Sue [name] is a 72 y.o. year old female with a history of DM, HFpEF, hypothyroidism, HTN, COPD, depression, anxiety, possible TIA in 2016, and former smoker who we last saw on 06/09/22.  To briefly review: Symptoms started about 11/2021. She has a burning and stinging pain in her feet and legs. She feels like something is squeezing her legs. She has the feeling of [place] in her legs. She feels that the left side is worse than the right. She reports that her left leg pain is near constant while the right seems more intermittent. The pain is mostly from the calf down, but occasional cramping in her thighs. She denies back pain, neck pain, and only rare headaches. She denies numbness or tingling in her hands or arms. Patient walks with a walker for the last 5 years (~2018). She has not had any recent falls.   Patient saw PCP (Dr. Carollee Herter) on 03/14/22 and  mentioned stinging and burning in legs that was worse in the evening. She denied back pain. Patient was tried on gabapentin 100 mg that did not help. She did not feel good on the medication, so she stopped it. She took it about a week. She was referred to neurology for further evaluation.   Patient is on seroquel 200 mg at night to help sleep.   She takes vitamin D and vitamin B12 supplements. She is also taking a nerve supplement from [employer]) that she thinks is helping.    The patient denies symptoms suggestive of oculobulbar weakness including diplopia, ptosis, dysphagia, poor saliva control, dysarthria/dysphonia, impaired mastication, facial weakness/droop.   The patient does not report symptoms referable to autonomic dysfunction including impaired sweating, heat or cold intolerance, excessive mucosal dryness, gastroparetic early satiety, postprandial abdominal bloating, constipation, bowel or bladder dyscontrol, or syncope/presyncope/orthostatic intolerance.   She does not report any constitutional symptoms like fever, night sweats, anorexia or unintentional weight loss.   EtOH use: None  Restrictive diet? No Family history of neuropathy/myopathy/NM disease? None   She had not had any physical therapy recently (as of 06/09/22).  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 There may also be some contributions from peripheral edema related pain. I will check an EMG for severity and labs to look for other treatable causes of neuropathy.   PLAN: -Blood work: MMA, IFE, B6 -EMG: LLE > RLE (PN  protocol) -Alpha lipoic acid 600 mg once or twice a day -Lidocaine cream PRN -Cramp OTC recommendations given (see AVS)  Since their last visit: MMA and B6 were normal. IFE was indeterminate. EMG on 11/27/21 showed a possible mild sensory predominant neuropathy (vs age  related changes). Patient was not getting adequate relief from gabapentin, so was switched to Lyrica 25 mg BID on 11/30/22. ***  Eating okay? Losing weight? (Need to check B1)?***  ROS: Pertinent positive and negative systems reviewed in HPI. ***   Current Outpatient Medications on File Prior to Visit  Medication Sig Dispense Refill   Accu-Chek Softclix Lancets lancets USE 1  TO CHECK GLUCOSE 4 TIMES DAILY 100 each 0   allopurinol (ZYLOPRIM) 100 MG tablet Take 100 mg by mouth daily as needed (for gout flareups).     Blood Glucose Monitoring Suppl (ACCU-CHEK GUIDE) w/Device KIT USE   TO CHECK GLUCOSE UP TO 4 TIMES DAILY AS DIRECTED 1 kit 0   busPIRone (BUSPAR) 15 MG tablet Take 1 tablet (15 mg total) by mouth 2 (two) times daily. 60 tablet 3   Dulaglutide (TRULICITY) 3 0000000 SOPN Inject 3 mg as directed once a week. 0.5 mL 3   famotidine (PEPCID) 20 MG tablet Take 20 mg by mouth daily as needed for heartburn or indigestion.     glucose blood (ACCU-CHEK GUIDE) test strip USE 1 STRIP TO CHECK GLUCOSE 4 TIMES DAILY 200 each 12   ibuprofen (ADVIL) 800 MG tablet Take 800 mg by mouth every 8 (eight) hours as needed for headache.     insulin glargine, 1 Unit Dial, (TOUJEO SOLOSTAR) 300 UNIT/ML Solostar Pen Inject 64 Units into the skin daily in the afternoon. 15 mL 6   Insulin Pen Needle 29G X 8MM MISC 1 Device by Does not apply route daily in the afternoon. 100 each 3   levothyroxine (SYNTHROID) 88 MCG tablet Take 1 tablet (88 mcg total) by mouth daily. 90 tablet 3   LORazepam (ATIVAN) 1 MG tablet TAKE 1/2 TO 1 AND 1/2 TABLETS AT BEDTIME AND AN ADDITIONAL 1/2 TAB ONCE A DAY AS NEEDED FOR AXIETY 60 tablet 2   Magnesium 400 MG CAPS Take 1 capsule by mouth daily. 30 capsule 0   metoprolol succinate (TOPROL-XL) 50 MG 24 hr tablet Take 2 tablets (100 mg total) by mouth daily. Take with or immediately following a meal 180 tablet 1   OXYGEN Inhale 3-4 L/min into the lungs continuous.      potassium  chloride SA (KLOR-CON M) 20 MEQ tablet Take 1 tablet (20 mEq total) by mouth 2 (two) times daily. 14 tablet 0   pregabalin (LYRICA) 25 MG capsule Take 1 capsule (25 mg total) by mouth 2 (two) times daily. Take 1 capsule (25 mg) daily for 1 week, if no side effects, then increase to 1 capsule (25 mg) twice daily thereafter. 60 capsule 5   QUEtiapine (SEROQUEL) 200 MG tablet Take 1 tablet (200 mg total) by mouth at bedtime. 90 tablet 1   rosuvastatin (CRESTOR) 10 MG tablet Take 1 tablet (10 mg total) by mouth daily. For heart health and to lower cholesterol. 90 tablet 3   torsemide (DEMADEX) 20 MG tablet Take 3 tablets by mouth once daily 270 tablet 1   zolpidem (AMBIEN CR) 12.5 MG CR tablet TAKE 1 TABLET BY MOUTH EVERY DAY AT BEDTIME AS NEEDED FOR SLEEP 30 tablet 0   No current facility-administered medications on file prior to visit.     Objective  There were no vitals filed for this visit. *** GEN:  The patient appears stated age and is in NAD.  Neurological examination: Orientation: The patient is alert and oriented. Cranial nerves: There is good facial symmetry. There is ***facial hypomimia.  The speech is fluent and clear. Soft palate rises symmetrically and there is no tongue deviation. Hearing is intact to conversational tone. Motor: Strength is at least antigravity x 4.   Shoulder shrug is equal and symmetric.  There is no pronator drift. Gait and Station: The patient has *** difficulty arising out of a deep-seated chair without the use of the hands. The patient's stride length is ***.     Lab and Test Review: New results: 06/09/22: IFE: poorly defined area of restricted protein mobility (reactive with IgG and kappa) B6 wnl MMA wnl  TSH (09/01/22): 5.455 BMP (10/28/22): significant for K 3.0, glucose 382, Cr 1.95 CBC (10/28/22): unremarkable  Previously reviewed results: Normal or unremarkable:  03/14/22: TSH: 15.21 HbA1c: 7.7 CMP significant for elevated glucose (201) and  Cr (1.38) CBC significant for elevated MCV (106.3) B12 (01/07/20): 448   MRI brain wo contrast (01/01/20): CLINICAL DATA:  TIA. Transient left facial numbness and slurred speech.   EXAM: MRI HEAD WITHOUT CONTRAST   TECHNIQUE: Multiplanar, multiecho pulse sequences of the brain and surrounding structures were obtained without intravenous contrast.   COMPARISON:  Head CT 01/01/2020 and MRI 07/30/2014   FINDINGS: Brain: There is no evidence of acute infarct, intracranial hemorrhage, mass, midline shift, or extra-axial fluid collection. The ventricles and sulci are normal. T2 hyperintensities in the cerebral white matter bilaterally are unchanged from the prior MRI and nonspecific but compatible with minimal chronic small vessel ischemic disease.   Vascular: Major intracranial vascular flow voids are preserved.   Skull and upper cervical spine: Unremarkable bone marrow signal.   Sinuses/Orbits: Remote medial left orbital fracture. Chronic right sphenoid sinusitis. Clear mastoid air cells.   Other: None.   IMPRESSION: 1. No acute intracranial abnormality. 2. Minimal chronic small vessel ischemic disease.    Follow up Instructions      -I discussed the assessment and treatment plan with the patient. The patient was provided an opportunity to ask questions and all were answered. The patient agreed with the plan and demonstrated an understanding of the instructions.   The patient was advised to call back or seek an in-person evaluation if the symptoms worsen or if the condition fails to improve as anticipated.    Total time spent on today's visit was ***minutes, including both face-to-face time and nonface-to-face time.  Time included that spent on review of records (prior notes available to me/labs/imaging if pertinent), discussing treatment and goals, answering patient's questions and coordinating care.   Shellia Carwin, MD

## 2022-12-12 ENCOUNTER — Telehealth: Payer: Self-pay

## 2022-12-13 ENCOUNTER — Telehealth: Payer: Self-pay | Admitting: Neurology

## 2022-12-13 NOTE — Telephone Encounter (Signed)
Patient states that she is in a lot of pain, it is like a stabbing pain. She wants to speak to someone about this. She does have a appt with Dr Berdine Addison tomorrow morning that is a VV due to her not having a ride. Please call patient

## 2022-12-13 NOTE — Telephone Encounter (Signed)
Voicemail not set up at  12/13/2022

## 2022-12-13 NOTE — Telephone Encounter (Signed)
Unable to leave message x2 at  [facility] 12/13/2022

## 2022-12-14 ENCOUNTER — Other Ambulatory Visit: Payer: Self-pay | Admitting: Behavioral Health

## 2022-12-14 ENCOUNTER — Telehealth (INDEPENDENT_AMBULATORY_CARE_PROVIDER_SITE_OTHER): Payer: Medicare HMO | Admitting: Neurology

## 2022-12-14 ENCOUNTER — Telehealth: Payer: Self-pay

## 2022-12-14 DIAGNOSIS — F5105 Insomnia due to other mental disorder: Secondary | ICD-10-CM

## 2022-12-14 DIAGNOSIS — F411 Generalized anxiety disorder: Secondary | ICD-10-CM

## 2022-12-14 DIAGNOSIS — E1142 Type 2 diabetes mellitus with diabetic polyneuropathy: Secondary | ICD-10-CM

## 2022-12-14 MED ORDER — LORAZEPAM 1 MG PO TABS: ORAL_TABLET | ORAL | 2 refills | Status: DC

## 2022-12-14 MED ORDER — ZOLPIDEM TARTRATE ER 12.5 MG PO TBCR: EXTENDED_RELEASE_TABLET | ORAL | 2 refills | Status: DC

## 2022-12-14 NOTE — Telephone Encounter (Signed)
Called pt and left a voice mail on her cell phone, that we were calling to set up video appointment and called home phone and mail box has not been set up.

## 2022-12-14 NOTE — Telephone Encounter (Signed)
Patient called in for refill Lorazepam 1mg  and Zolpidem 12.5mg . Ph: [place] [facility] [place], [place]

## 2022-12-15 ENCOUNTER — Other Ambulatory Visit: Payer: Self-pay | Admitting: Behavioral Health

## 2022-12-15 ENCOUNTER — Encounter: Payer: Self-pay | Admitting: Neurology

## 2022-12-15 DIAGNOSIS — F411 Generalized anxiety disorder: Secondary | ICD-10-CM

## 2022-12-15 DIAGNOSIS — F5105 Insomnia due to other mental disorder: Secondary | ICD-10-CM

## 2022-12-15 MED ORDER — ZOLPIDEM TARTRATE ER 12.5 MG PO TBCR: EXTENDED_RELEASE_TABLET | ORAL | 2 refills | Status: DC

## 2022-12-15 MED ORDER — LORAZEPAM 1 MG PO TABS: ORAL_TABLET | ORAL | 2 refills | Status: DC

## 2022-12-15 NOTE — Telephone Encounter (Signed)
Zolpidem and Lorazepam scripts were sent to [facility] on [address]; should have been sent to [facility] [address].  Next appt 8/12

## 2022-12-15 NOTE — Telephone Encounter (Signed)
Pt was unable to connect with [place], She is going to reschedule.

## 2022-12-20 DIAGNOSIS — K219 Gastro-esophageal reflux disease without esophagitis: Secondary | ICD-10-CM | POA: Diagnosis not present

## 2022-12-20 DIAGNOSIS — I10 Essential (primary) hypertension: Secondary | ICD-10-CM | POA: Diagnosis not present

## 2022-12-20 DIAGNOSIS — E1136 Type 2 diabetes mellitus with diabetic cataract: Secondary | ICD-10-CM | POA: Diagnosis not present

## 2022-12-20 DIAGNOSIS — I509 Heart failure, unspecified: Secondary | ICD-10-CM | POA: Diagnosis not present

## 2022-12-20 DIAGNOSIS — J449 Chronic obstructive pulmonary disease, unspecified: Secondary | ICD-10-CM | POA: Diagnosis not present

## 2022-12-20 DIAGNOSIS — H2511 Age-related nuclear cataract, right eye: Secondary | ICD-10-CM | POA: Diagnosis not present

## 2022-12-23 ENCOUNTER — Emergency Department (HOSPITAL_COMMUNITY): Payer: Medicare HMO

## 2022-12-23 ENCOUNTER — Encounter (HOSPITAL_COMMUNITY): Payer: Self-pay

## 2022-12-23 ENCOUNTER — Other Ambulatory Visit: Payer: Self-pay

## 2022-12-23 ENCOUNTER — Emergency Department (HOSPITAL_COMMUNITY)
Admission: EM | Admit: 2022-12-23 | Discharge: 2022-12-23 | Disposition: A | Discharge status: Home / Self Care | Payer: Medicare HMO | Attending: Emergency Medicine | Admitting: Emergency Medicine

## 2022-12-23 DIAGNOSIS — J441 Chronic obstructive pulmonary disease with (acute) exacerbation: Secondary | ICD-10-CM

## 2022-12-23 DIAGNOSIS — I10 Essential (primary) hypertension: Secondary | ICD-10-CM | POA: Insufficient documentation

## 2022-12-23 DIAGNOSIS — M25562 Pain in left knee: Secondary | ICD-10-CM | POA: Insufficient documentation

## 2022-12-23 DIAGNOSIS — R0602 Shortness of breath: Secondary | ICD-10-CM | POA: Diagnosis not present

## 2022-12-23 DIAGNOSIS — E876 Hypokalemia: Secondary | ICD-10-CM

## 2022-12-23 DIAGNOSIS — G8929 Other chronic pain: Secondary | ICD-10-CM

## 2022-12-23 DIAGNOSIS — Z79899 Other long term (current) drug therapy: Secondary | ICD-10-CM | POA: Diagnosis not present

## 2022-12-23 DIAGNOSIS — E119 Type 2 diabetes mellitus without complications: Secondary | ICD-10-CM | POA: Insufficient documentation

## 2022-12-23 DIAGNOSIS — E039 Hypothyroidism, unspecified: Secondary | ICD-10-CM | POA: Diagnosis not present

## 2022-12-23 DIAGNOSIS — R739 Hyperglycemia, unspecified: Secondary | ICD-10-CM | POA: Diagnosis not present

## 2022-12-23 DIAGNOSIS — Z794 Long term (current) use of insulin: Secondary | ICD-10-CM | POA: Diagnosis not present

## 2022-12-23 LAB — CBC WITH DIFFERENTIAL/PLATELET
Abs Immature Granulocytes: 0.02 10*3/uL (ref 0.00–0.07)
Basophils Absolute: 0 10*3/uL (ref 0.0–0.1)
Basophils Relative: 1 %
Eosinophils Absolute: 0.2 10*3/uL (ref 0.0–0.5)
Eosinophils Relative: 3 %
HCT: 35.5 % — ABNORMAL LOW (ref 36.0–46.0)
Hemoglobin: 11.4 g/dL — ABNORMAL LOW (ref 12.0–15.0)
Immature Granulocytes: 0 %
Lymphocytes Relative: 39 %
Lymphs Abs: 2.4 10*3/uL (ref 0.7–4.0)
MCH: 32.3 pg (ref 26.0–34.0)
MCHC: 32.1 g/dL (ref 30.0–36.0)
MCV: 100.6 fL — ABNORMAL HIGH (ref 80.0–100.0)
Monocytes Absolute: 0.5 10*3/uL (ref 0.1–1.0)
Monocytes Relative: 8 %
Neutro Abs: 2.9 10*3/uL (ref 1.7–7.7)
Neutrophils Relative %: 49 %
Platelets: 189 10*3/uL (ref 150–400)
RBC: 3.53 MIL/uL — ABNORMAL LOW (ref 3.87–5.11)
RDW: 13.5 % (ref 11.5–15.5)
WBC: 6 10*3/uL (ref 4.0–10.5)
nRBC: 0 % (ref 0.0–0.2)

## 2022-12-23 LAB — COMPREHENSIVE METABOLIC PANEL
ALT: 23 U/L (ref 0–44)
AST: 47 U/L — ABNORMAL HIGH (ref 15–41)
Albumin: 3.2 g/dL — ABNORMAL LOW (ref 3.5–5.0)
Alkaline Phosphatase: 125 U/L (ref 38–126)
Anion gap: 8 (ref 5–15)
BUN: 21 mg/dL (ref 8–23)
CO2: 30 mmol/L (ref 22–32)
Calcium: 9.2 mg/dL (ref 8.9–10.3)
Chloride: 100 mmol/L (ref 98–111)
Creatinine, Ser: 1.76 mg/dL — ABNORMAL HIGH (ref 0.44–1.00)
GFR, Estimated: 31 mL/min — ABNORMAL LOW (ref >60)
Glucose, Bld: 431 mg/dL — ABNORMAL HIGH (ref 70–99)
Potassium: 2.9 mmol/L — ABNORMAL LOW (ref 3.5–5.1)
Sodium: 138 mmol/L (ref 135–145)
Total Bilirubin: 0.5 mg/dL (ref 0.3–1.2)
Total Protein: 8.4 g/dL — ABNORMAL HIGH (ref 6.5–8.1)

## 2022-12-23 LAB — BRAIN NATRIURETIC PEPTIDE: B Natriuretic Peptide: 17.5 pg/mL (ref 0.0–100.0)

## 2022-12-23 LAB — BLOOD GAS, VENOUS
Acid-Base Excess: 4.5 mmol/L — ABNORMAL HIGH (ref 0.0–2.0)
Bicarbonate: 32.5 mmol/L — ABNORMAL HIGH (ref 20.0–28.0)
O2 Saturation: 67.8 %
Patient temperature: 37
pCO2, Ven: 63 mmHg — ABNORMAL HIGH (ref 44–60)
pH, Ven: 7.32 (ref 7.25–7.43)
pO2, Ven: 40 mmHg (ref 32–45)

## 2022-12-23 LAB — TROPONIN I (HIGH SENSITIVITY)
Troponin I (High Sensitivity): 9 ng/L (ref <18)
Troponin I (High Sensitivity): 8 ng/L (ref <18)

## 2022-12-23 LAB — CBG MONITORING, ED: Glucose-Capillary: 403 mg/dL — ABNORMAL HIGH (ref 70–99)

## 2022-12-23 MED ORDER — POTASSIUM CHLORIDE CRYS ER 20 MEQ PO TBCR
40.0000 meq | EXTENDED_RELEASE_TABLET | Freq: Once | ORAL | Status: AC
  Administered 2022-12-23: 40 meq via ORAL
  Filled 2022-12-23: qty 2

## 2022-12-23 MED ORDER — ALBUTEROL SULFATE HFA 108 (90 BASE) MCG/ACT IN AERS: 1.0000 | INHALATION_SPRAY | Freq: Four times a day (QID) | RESPIRATORY_TRACT | 0 refills | Status: DC | PRN

## 2022-12-23 MED ORDER — PREDNISONE 50 MG PO TABS: 50.0000 mg | ORAL_TABLET | Freq: Every day | ORAL | 0 refills | Status: AC

## 2022-12-23 MED ORDER — DOXYCYCLINE HYCLATE 100 MG PO CAPS: 100.0000 mg | ORAL_CAPSULE | Freq: Two times a day (BID) | ORAL | 0 refills | Status: AC

## 2022-12-23 NOTE — ED Provider Notes (Signed)
Sue [name]  CSN: [id number] Arrival date & time: 12/23/22 1047  Chief Complaint(s) Shortness of Breath  HPI Sue [name] is a 72 y.o. female with past medical history as below, significant for DM2, emphysema on 3 L nasal cannula, hypertension, obesity, TIA who presents to the ED with complaint of difficulty breathing.  Leg pain.  She reports worsening difficulty breathing over the past 2 weeks, noted at rest, worse with exertion.  Unable to lie flat.  Variable compliance with her Lasix.  Does feel she is gained weight over the past week but unsure how much.  Difficulty ambulating secondary to difficulty breathing and worsening chronic leg pain.  Reports pain to her legs over the past 3-6 months, not acutely worsened today.  No fevers, chills, nausea or vomiting, no significant chest pain, no cough.  Past Medical History Past Medical History:  Diagnosis Date   Allergic rhinitis    Depression    Diabetes mellitus type 2, uncontrolled    Emphysema of lung    3L home O2   GERD (gastroesophageal reflux disease)    Hypertension    Hypothyroidism    Obesity, morbid, BMI 50 or higher    Stroke 2016   TIA    Urine incontinence    Patient Active Problem List   Diagnosis Date Noted   Acute encephalopathy 09/02/2022   Hypotension 09/02/2022   Type 2 diabetes mellitus with hyperglycemia, with long-term current use of insulin 04/29/2022   Neuropathy 03/14/2022   Lower extremity edema 07/16/2021   Need for influenza vaccination 07/16/2021   Type 2 diabetes mellitus with stage 3b chronic kidney disease, with long-term current use of insulin 05/18/2021   Type 2 diabetes mellitus with diabetic polyneuropathy, with long-term current use of insulin 05/18/2021   Urine incontinence 05/10/2021   Rash 04/05/2021   Palpitation 04/05/2021   Acute bronchitis due to COVID-19 virus 03/09/2021   Hyperlipidemia 02/01/2021   Left knee pain  02/01/2021   Palpitations 02/01/2021   Chronic pain of both shoulders 05/29/2020   Chronic diastolic heart failure 01/08/2020   CKD (chronic kidney disease) stage 3, GFR 30-59 ml/min 11/13/2019   Acute blood loss anemia 11/13/2019   Lower GI bleed 08/16/2019   Symptomatic anemia 08/03/2019   QT prolongation 08/03/2019   GI bleed 08/02/2019   Diabetes mellitus with renal complications 05/29/2019   Hyperlipidemia associated with type 2 diabetes mellitus 05/29/2019   DVT (deep venous thrombosis) 03/17/2019   Acute on chronic renal insufficiency 03/17/2019   Macrocytic anemia 03/17/2019   Small bowel obstruction 03/08/2019   Fracture of left tibia 02/25/2019   Left tibial fracture 02/25/2019   Hyperglycemia 08/16/2018   AKI (acute kidney injury) 08/15/2018   Morbid obesity due to excess calories 05/09/2018   Acute bronchitis with COPD 10/19/2017   Atypical chest pain 05/01/2017   Pulmonary emphysema 04/06/2017   Chronic seasonal allergic rhinitis 04/06/2017   GERD (gastroesophageal reflux disease) 04/06/2017   Snoring 04/06/2017   Myalgia 02/01/2017   Arthralgia 02/01/2017   Chronic pain of toe of left foot 11/14/2016   Chronic hepatitis C without hepatic coma 08/01/2016   Hypothyroidism 10/29/2015   Mild diastolic dysfunction 01/01/2015   Edema 01/01/2015   Edema of both ankles 12/24/2014   Stroke 2016   TIA (transient ischemic attack) 07/30/2014   Weakness 07/29/2014   Flank pain 11/12/2013   Chronic respiratory failure 10/25/2013   DOE (dyspnea on exertion) 10/25/2013   Depression  with anxiety 10/15/2013   Insomnia 10/15/2013   Essential hypertension 10/09/2013   Hypokalemia 10/09/2013   Home Medication(s) Prior to Admission medications   Medication Sig Start Date End Date Taking? Authorizing Provider  albuterol (VENTOLIN HFA) 108 (90 Base) MCG/ACT inhaler Inhale 1-2 puffs into the lungs every 6 (six) hours as needed for wheezing or shortness of breath. 12/23/22  Yes  Tanda Rockers A, DO  doxycycline (VIBRAMYCIN) 100 MG capsule Take 1 capsule (100 mg total) by mouth 2 (two) times daily for 7 days. 12/23/22 12/30/22 Yes Sloan Leiter, DO  predniSONE (DELTASONE) 50 MG tablet Take 1 tablet (50 mg total) by mouth daily for 5 days. 12/23/22 12/28/22 Yes Tanda Rockers A, DO  Accu-Chek Softclix Lancets lancets USE 1  TO CHECK GLUCOSE 4 TIMES DAILY 06/27/22   Zola Button, Grayling Congress, DO  allopurinol (ZYLOPRIM) 100 MG tablet Take 100 mg by mouth daily as needed (for gout flareups). 06/10/21   [provider]  Blood Glucose Monitoring Suppl (ACCU-CHEK GUIDE) w/Device KIT USE   TO CHECK GLUCOSE UP TO 4 TIMES DAILY AS DIRECTED 11/11/22   Zola Button, Grayling Congress, DO  busPIRone (BUSPAR) 15 MG tablet Take 1 tablet (15 mg total) by mouth 2 (two) times daily. 10/24/22   Joan Flores, NP  Dulaglutide (TRULICITY) 3 MG/0.5ML SOPN Inject 3 mg as directed once a week. 11/03/22   Donato Schultz, DO  famotidine (PEPCID) 20 MG tablet Take 20 mg by mouth daily as needed for heartburn or indigestion.    [provider]  glucose blood (ACCU-CHEK GUIDE) test strip USE 1 STRIP TO CHECK GLUCOSE 4 TIMES DAILY 11/04/22   Zola Button, Grayling Congress, DO  ibuprofen (ADVIL) 800 MG tablet Take 800 mg by mouth every 8 (eight) hours as needed for headache. 07/28/22   [provider]  insulin glargine, 1 Unit Dial, (TOUJEO SOLOSTAR) 300 UNIT/ML Solostar Pen Inject 64 Units into the skin daily in the afternoon. 04/29/22   Shamleffer, Konrad Dolores, MD  Insulin Pen Needle 29G X MISC 1 Device by Does not apply route daily in the afternoon. 04/29/22   Shamleffer, Konrad Dolores, MD  levothyroxine (SYNTHROID) 88 MCG tablet Take 1 tablet (88 mcg total) by mouth daily. 04/29/22   Shamleffer, Konrad Dolores, MD  LORazepam (ATIVAN) 1 MG tablet TAKE 1/2 TO 1 AND 1/2 TABLETS AT BEDTIME AND AN ADDITIONAL 1/2 TAB ONCE A DAY AS NEEDED FOR AXIETY 12/15/22   Avelina Laine A, NP  LORazepam (ATIVAN) 1 MG  tablet TAKE 1/2 TO 1 AND 1/2 TABLETS AT BEDTIME AND AN ADDITIONAL 1/2 TAB ONCE A DAY AS NEEDED FOR AXIETY 12/15/22   Joan Flores, NP  Magnesium 400 MG CAPS Take 1 capsule by mouth daily. 01/14/20   Bradd Canary, MD  metoprolol succinate (TOPROL-XL) 50 MG 24 hr tablet Take 2 tablets (100 mg total) by mouth daily. Take with or immediately following a meal 08/30/22   Zola Button, Grayling Congress, DO  OXYGEN Inhale 3-4 L/min into the lungs continuous.     [provider]  potassium chloride SA (KLOR-CON M) 20 MEQ tablet Take 1 tablet (20 mEq total) by mouth 2 (two) times daily. 10/28/22   Linwood Dibbles, MD  pregabalin (LYRICA) 25 MG capsule Take 1 capsule (25 mg total) by mouth 2 (two) times daily. Take 1 capsule (25 mg) daily for 1 week, if no side effects, then increase to 1 capsule (25 mg) twice daily thereafter. 11/30/22  Antony Madura, MD  QUEtiapine (SEROQUEL) 200 MG tablet Take 1 tablet (200 mg total) by mouth at bedtime. 10/24/22   Joan Flores, NP  rosuvastatin (CRESTOR) 10 MG tablet Take 1 tablet (10 mg total) by mouth daily. For heart health and to lower cholesterol. 08/23/22   Donato Schultz, DO  torsemide (DEMADEX) 20 MG tablet Take 3 tablets by mouth once daily 09/20/22   Zola Button, Grayling Congress, DO  zolpidem (AMBIEN CR) 12.5 MG CR tablet TAKE 1 TABLET BY MOUTH AT BEDTIME AS NEEDED FOR SLEEP 12/15/22   Joan Flores, NP  zolpidem (AMBIEN CR) 12.5 MG CR tablet TAKE 1 TABLET BY MOUTH EVERY DAY AT BEDTIME AS NEEDED FOR SLEEP 12/15/22   Joan Flores, NP                                                                                                                                    Past Surgical History Past Surgical History:  Procedure Laterality Date   ABDOMINAL HYSTERECTOMY     CESAREAN SECTION     COLONOSCOPY N/A 08/19/2019   Procedure: COLONOSCOPY;  Surgeon: Vida Rigger, MD;  Location: [facility];  Service: Endoscopy;  Laterality: N/A;   COLONOSCOPY WITH PROPOFOL N/A 08/05/2019    Procedure: COLONOSCOPY WITH PROPOFOL;  Surgeon: Charlott Rakes, MD;  Location: [facility];  Service: Gastroenterology;  Laterality: N/A;   COLONOSCOPY WITH PROPOFOL N/A 11/19/2019   Procedure: COLONOSCOPY WITH PROPOFOL;  Surgeon: Bernette Redbird, MD;  Location: [facility];  Service: Endoscopy;  Laterality: N/A;  Unprepped   COLONOSCOPY WITH PROPOFOL N/A 11/22/2019   Procedure: COLONOSCOPY WITH PROPOFOL;  Surgeon: Bernette Redbird, MD;  Location: [facility];  Service: Endoscopy;  Laterality: N/A;   COLONOSCOPY WITH PROPOFOL N/A 11/23/2019   Procedure: COLONOSCOPY WITH PROPOFOL;  Surgeon: Kerin Salen, MD;  Location: [facility];  Service: Gastroenterology;  Laterality: N/A;   COLONOSCOPY WITH PROPOFOL N/A 11/29/2019   Procedure: COLONOSCOPY WITH PROPOFOL;  Surgeon: Charlott Rakes, MD;  Location: [facility];  Service: Endoscopy;  Laterality: N/A;   ENTEROSCOPY N/A 11/24/2019   Procedure: ENTEROSCOPY;  Surgeon: Kerin Salen, MD;  Location: [facility];  Service: Gastroenterology;  Laterality: N/A;   ENTEROSCOPY N/A 11/27/2019   Procedure: ENTEROSCOPY;  Surgeon: Charlott Rakes, MD;  Location: [facility];  Service: Endoscopy;  Laterality: N/A;   ESOPHAGOGASTRODUODENOSCOPY N/A 11/27/2019   Procedure: ESOPHAGOGASTRODUODENOSCOPY (EGD);  Surgeon: Charlott Rakes, MD;  Location: [facility];  Service: Endoscopy;  Laterality: N/A;   ESOPHAGOGASTRODUODENOSCOPY (EGD) WITH PROPOFOL N/A 11/24/2019   Procedure: ESOPHAGOGASTRODUODENOSCOPY (EGD) WITH PROPOFOL;  Surgeon: Kerin Salen, MD;  Location: [facility];  Service: Gastroenterology;  Laterality: N/A;  PUSH enteroscopy   GIVENS CAPSULE STUDY N/A 11/19/2019   Procedure: GIVENS CAPSULE STUDY;  Surgeon: Bernette Redbird, MD;  Location: [facility];  Service: Endoscopy;  Laterality: N/A;  To be performed immediately following colonoscopy   GIVENS CAPSULE STUDY N/A 11/24/2019  Procedure: GIVENS CAPSULE STUDY;  Surgeon: Kerin Salen, MD;  Location: [facility];  Service: Gastroenterology;  Laterality: N/A;   GIVENS CAPSULE STUDY N/A 11/28/2019   Procedure: GIVENS CAPSULE STUDY;  Surgeon: Charlott Rakes, MD;  Location: [facility];  Service: Endoscopy;  Laterality: N/A;   HEMOSTASIS CLIP PLACEMENT  11/19/2019   Procedure: HEMOSTASIS CLIP PLACEMENT;  Surgeon: Bernette Redbird, MD;  Location: [facility];  Service: Endoscopy;;   HEMOSTASIS CLIP PLACEMENT  11/22/2019   Procedure: HEMOSTASIS CLIP PLACEMENT;  Surgeon: Bernette Redbird, MD;  Location: [facility];  Service: Endoscopy;;   HOT HEMOSTASIS N/A 11/24/2019   Procedure: HOT HEMOSTASIS (ARGON PLASMA COAGULATION/BICAP);  Surgeon: Kerin Salen, MD;  Location: [facility];  Service: Gastroenterology;  Laterality: N/A;   HOT HEMOSTASIS N/A 11/27/2019   Procedure: HOT HEMOSTASIS (ARGON PLASMA COAGULATION/BICAP);  Surgeon: Charlott Rakes, MD;  Location: [facility];  Service: Endoscopy;  Laterality: N/A;   SUBMUCOSAL TATTOO INJECTION  11/19/2019   Procedure: SUBMUCOSAL TATTOO INJECTION;  Surgeon: Bernette Redbird, MD;  Location: [facility];  Service: Endoscopy;;   Family History Family History  Problem Relation Age of Onset   Heart disease Father        MVP and Pics Valve   Hypertension Father    Depression Father        Institutionalized x's 2 years   Bipolar disorder Father    Hypertension Sister    Diabetes Sister    Hyperlipidemia Sister    Heart disease Sister 5       MI   Heart disease Brother    Hypertension Brother    Schizophrenia Paternal Aunt    Depression Paternal Aunt    Anxiety disorder Paternal Aunt    Heart disease Paternal Aunt    Schizophrenia Paternal Aunt    Heart disease Paternal Uncle    Heart disease Paternal Grandmother    Asthma Son    Asthma Son     Social History Social History   Tobacco Use   Smoking status: Former    Packs/day: 1.00    Years: 40.00    Additional pack years: 0.00    Total pack years: 40.00    Types: Cigarettes    Start  date: 07/21/1972    Quit date: 10/16/2011    Years since quitting: 11.2   Smokeless tobacco: Never  Vaping Use   Vaping Use: Never used  Substance Use Topics   Alcohol use: Not Currently    Comment: Occ-- Wine   Drug use: No   Allergies Hydrocodone, Norvasc [amlodipine besylate], and Tizanidine  Review of Systems Review of Systems  Constitutional:  Negative for chills and fever.  HENT:  Negative for facial swelling and trouble swallowing.   Eyes:  Negative for photophobia and visual disturbance.  Respiratory:  Positive for shortness of breath. Negative for cough.   Cardiovascular:  Positive for leg swelling. Negative for chest pain and palpitations.  Gastrointestinal:  Negative for abdominal pain, nausea and vomiting.  Endocrine: Negative for polydipsia and polyuria.  Genitourinary:  Negative for difficulty urinating and hematuria.  Musculoskeletal:  Positive for arthralgias. Negative for gait problem and joint swelling.  Skin:  Negative for pallor and rash.  Neurological:  Negative for syncope and headaches.  Psychiatric/Behavioral:  Negative for agitation and confusion.     Physical Exam Vital Signs  I have reviewed the triage vital signs BP (!) 150/89 (BP Location: Right Arm)   Pulse 91   Temp (!) 97.5 F (36.4 C) (Oral)  Resp 15   Ht 5\' 3"  (1.6 m)   Wt 126.1 kg   SpO2 99%   BMI 49.25 kg/m  Physical Exam Vitals and nursing [name] reviewed.  Constitutional:      General: She is not in acute distress.    Appearance: Normal appearance.  HENT:     Head: Normocephalic and atraumatic.     Right Ear: External ear normal.     Left Ear: External ear normal.     Nose: Nose normal.     Mouth/Throat:     Mouth: Mucous membranes are moist.  Eyes:     General: No scleral icterus.       Right eye: No discharge.        Left eye: No discharge.     Extraocular Movements: Extraocular movements intact.     Pupils: Pupils are equal, round, and reactive to light.     Comments:  Conjunctival hemorrhage bilateral, recent cataract surgery  Cardiovascular:     Rate and Rhythm: Normal rate and regular rhythm.     Pulses: Normal pulses.     Heart sounds: Normal heart sounds.  Pulmonary:     Effort: Pulmonary effort is normal. No respiratory distress.     Breath sounds: Decreased breath sounds present.  Abdominal:     General: Abdomen is flat.     Tenderness: There is no abdominal tenderness.  Musculoskeletal:     Right lower leg: Edema present.     Left lower leg: Edema present.  Skin:    General: Skin is warm and dry.     Capillary Refill: Capillary refill takes less than 2 seconds.  Neurological:     Mental Status: She is alert and oriented to person, place, and time.     GCS: GCS eye subscore is 4. GCS verbal subscore is 5. GCS motor subscore is 6.     Cranial Nerves: Cranial nerves 2-12 are intact.     Sensory: Sensation is intact.     Motor: Motor function is intact. No tremor.     Coordination: Coordination is intact.     Comments: Gait not tested 2/2 pt safety   Psychiatric:        Mood and Affect: Mood normal.        Behavior: Behavior normal.     ED Results and Treatments Labs (all labs ordered are listed, but only abnormal results are displayed) Labs Reviewed  CBC WITH DIFFERENTIAL/PLATELET - Abnormal; Notable for the following components:      Result Value   RBC 3.53 (*)    Hemoglobin 11.4 (*)    HCT 35.5 (*)    MCV 100.6 (*)    All other components within normal limits  COMPREHENSIVE METABOLIC PANEL - Abnormal; Notable for the following components:   Potassium 2.9 (*)    Glucose, Bld 431 (*)    Creatinine, Ser 1.76 (*)    Total Protein 8.4 (*)    Albumin 3.2 (*)    AST 47 (*)    GFR, Estimated 31 (*)    All other components within normal limits  BLOOD GAS, VENOUS - Abnormal; Notable for the following components:   pCO2, Ven 63 (*)    Bicarbonate 32.5 (*)    Acid-Base Excess 4.5 (*)    All other components within normal limits   CBG MONITORING, ED - Abnormal; Notable for the following components:   Glucose-Capillary 403 (*)    All other components within normal limits  BRAIN NATRIURETIC  PEPTIDE  TROPONIN I (HIGH SENSITIVITY)  TROPONIN I (HIGH SENSITIVITY)                                                                                                                          Radiology No results found.  Pertinent labs & imaging results that were available during my care of the patient were reviewed by me and considered in my medical decision making (see MDM for details).  Medications Ordered in ED Medications  potassium chloride SA (KLOR-CON M) CR tablet 40 mEq (40 mEq Oral Given 12/23/22 1345)                                                                                                                                     Procedures Procedures  (including critical care time)  Medical Decision Making / ED Course    Medical Decision Making:    Sue [name] is a 72 y.o. female  with past medical history as below, significant for DM2, emphysema on 3 L nasal cannula, hypertension, obesity, TIA who presents to the ED with complaint of difficulty breathing.  Leg pain.. The complaint involves an extensive differential diagnosis and also carries with it a high risk of complications and morbidity.  Serious etiology was considered. Ddx includes but is not limited to: In my evaluation of this patient's dyspnea my DDx includes, but is not limited to, pneumonia, pulmonary embolism, pneumothorax, pulmonary edema, metabolic acidosis, asthma, COPD, cardiac cause, anemia, anxiety, etc.    Complete initial physical exam performed, notably the patient  was she is breathing comfortably on nasal cannula, no acute distress.  HDS.Marland Kitchen    Reviewed and confirmed nursing documentation for past medical history, family history, social history.  Vital signs reviewed.    Clinical Course as of 12/25/22 2113  Fri Dec 23, 2022  1306  Creatinine(!): 1.76 Similar to prior  [SG]  1306 Hemoglobin(!): 11.4 Similar to prior  [SG]  1704 pH, Ven: 7.32 [SG]  1704 pCO2, Ven(!): 63 Compensated  [SG]    Clinical Course User Index [SG] Tanda Rockers A, DO   Feeling better, breathing comfortably, workup today re-assuring, feeling better, advised close pcp f/u. Does not appear to be CHF exacerbation, no hypoxia or respiratory distress. She is on her typical home O2. Acting at baseline. Feels back to normal, d/w family at bedside. Acting at baseline currently.  Glucose elevated,  no DKA or HHS. K is low, was replaced orally   The patient improved significantly and was discharged in stable condition. Detailed discussions were had with the patient regarding current findings, and need for close f/u with PCP or on call doctor. The patient has been instructed to return immediately if the symptoms worsen in any way for re-evaluation. Patient verbalized understanding and is in agreement with current care plan. All questions answered prior to discharge.    Additional history obtained: -Additional history obtained from family -External records from outside source obtained and reviewed including: Chart review including previous notes, labs, imaging, consultation notes including home medications, primary care recommendation, prior labs and imaging   Lab Tests: -I ordered, reviewed, and interpreted labs.   The pertinent results include:   Labs Reviewed  CBC WITH DIFFERENTIAL/PLATELET - Abnormal; Notable for the following components:      Result Value   RBC 3.53 (*)    Hemoglobin 11.4 (*)    HCT 35.5 (*)    MCV 100.6 (*)    All other components within normal limits  COMPREHENSIVE METABOLIC PANEL - Abnormal; Notable for the following components:   Potassium 2.9 (*)    Glucose, Bld 431 (*)    Creatinine, Ser 1.76 (*)    Total Protein 8.4 (*)    Albumin 3.2 (*)    AST 47 (*)    GFR, Estimated 31 (*)    All other components within normal  limits  BLOOD GAS, VENOUS - Abnormal; Notable for the following components:   pCO2, Ven 63 (*)    Bicarbonate 32.5 (*)    Acid-Base Excess 4.5 (*)    All other components within normal limits  CBG MONITORING, ED - Abnormal; Notable for the following components:   Glucose-Capillary 403 (*)    All other components within normal limits  BRAIN NATRIURETIC PEPTIDE  TROPONIN I (HIGH SENSITIVITY)  TROPONIN I (HIGH SENSITIVITY)    Notable for stable  EKG   EKG Interpretation  Date/Time:  Friday December 23 2022 11:14:23 EDT Ventricular Rate:  88 PR Interval:  194 QRS Duration: 108 QT Interval:  442 QTC Calculation: 535 R Axis:   56 Text Interpretation: Sinus rhythm Borderline T abnormalities, diffuse leads Prolonged QT interval Confirmed by Zadie Rhine ([id number]) on 12/24/2022 11:25:49 AM         Imaging Studies ordered: I ordered imaging studies including CXR I independently visualized the following imaging with scope of interpretation limited to determining acute life threatening conditions related to emergency care; findings noted above, significant for wnl I independently visualized and interpreted imaging. I agree with the radiologist interpretation   Medicines ordered and prescription drug management: Meds ordered this encounter  Medications   potassium chloride SA (KLOR-CON M) CR tablet 40 mEq   predniSONE (DELTASONE) 50 MG tablet    Sig: Take 1 tablet (50 mg total) by mouth daily for 5 days.    Dispense:  5 tablet    Refill:  0   doxycycline (VIBRAMYCIN) 100 MG capsule    Sig: Take 1 capsule (100 mg total) by mouth 2 (two) times daily for 7 days.    Dispense:  14 capsule    Refill:  0   albuterol (VENTOLIN HFA) 108 (90 Base) MCG/ACT inhaler    Sig: Inhale 1-2 puffs into the lungs every 6 (six) hours as needed for wheezing or shortness of breath.    Dispense:  1 each    Refill:  0    -I  have reviewed the patients home medicines and have made adjustments as  needed   Consultations Obtained: na   Cardiac Monitoring: The patient was maintained on a cardiac monitor.  I personally viewed and interpreted the cardiac monitored which showed an underlying rhythm of: SR  Social Determinants of Health:  Diagnosis or treatment significantly limited by social determinants of health: obesity   Reevaluation: After the interventions noted above, I reevaluated the patient and found that they have resolved  Co morbidities that complicate the patient evaluation  Past Medical History:  Diagnosis Date   Allergic rhinitis    Depression    Diabetes mellitus type 2, uncontrolled    Emphysema of lung    3L home O2   GERD (gastroesophageal reflux disease)    Hypertension    Hypothyroidism    Obesity, morbid, BMI 50 or higher    Stroke 2016   TIA    Urine incontinence       Dispostion: Disposition decision including need for hospitalization was considered, and patient discharged from emergency department.    Final Clinical Impression(s) / ED Diagnoses Final diagnoses:  COPD exacerbation  Hypokalemia  Chronic pain of left knee     This chart was dictated using voice recognition software.  Despite best efforts to proofread,  errors can occur which can change the documentation meaning.    Sloan Leiter, DO 12/25/22 2113

## 2022-12-23 NOTE — Discharge Instructions (Addendum)
It was a pleasure caring for you today in the emergency department.  Please return to the emergency department for any worsening or worrisome symptoms.   Please follow-up with your PCP in the next few days for recheck of your potassium and rechecking your symptoms.

## 2022-12-23 NOTE — ED Triage Notes (Addendum)
Patient has been feeling short of breath for 2 weeks even at rest. 4L [place] baseline. Has diabetes. Increased swelling in both legs. Takes lasix. Patients daughter said she is acting different.

## 2022-12-23 NOTE — ED Notes (Signed)
Patient informed nurse at DC that she did not bring home oxygen. RN informed nurse that transportation can be set up with [facility] due to patient needing oxygen. Patient son is at bedside and stated they live 10 minutes a way and patient would be ok until they got home, RN reeducated patient and family on safety and possible issues that could occur due to patient not wearing her oxygen, clear understanding voiced by patient and her son and they decided to travel in personal vehicle.

## 2023-01-03 ENCOUNTER — Other Ambulatory Visit (HOSPITAL_COMMUNITY): Payer: Self-pay

## 2023-01-03 NOTE — Telephone Encounter (Signed)
Talked to [facility] they filled the pregabalin 25 mg on 12/13/2022 (does not need a prior authorization).  What was needing a Prior Authorization was the Gabapentin they still had on patient's profile but they have cancelled all refills since it has been discontinued and changed to pregabalin

## 2023-01-04 ENCOUNTER — Other Ambulatory Visit: Payer: Self-pay | Admitting: Family Medicine

## 2023-01-04 ENCOUNTER — Telehealth: Payer: Self-pay | Admitting: Family Medicine

## 2023-01-04 DIAGNOSIS — G629 Polyneuropathy, unspecified: Secondary | ICD-10-CM

## 2023-01-04 NOTE — Telephone Encounter (Signed)
Contacted [name] to schedule their annual wellness visit. Appointment made for 01/26/2022.  Verlee Rossetti; Care Guide Ambulatory Clinical Support [facility] Direct Dial: [phone]

## 2023-01-06 ENCOUNTER — Other Ambulatory Visit: Payer: Self-pay

## 2023-01-06 ENCOUNTER — Telehealth: Payer: Self-pay | Admitting: Anesthesiology

## 2023-01-06 DIAGNOSIS — E1142 Type 2 diabetes mellitus with diabetic polyneuropathy: Secondary | ICD-10-CM

## 2023-01-06 DIAGNOSIS — R209 Unspecified disturbances of skin sensation: Secondary | ICD-10-CM

## 2023-01-06 MED ORDER — PREGABALIN 50 MG PO CAPS
ORAL_CAPSULE | ORAL | 4 refills | Status: DC
Start: 2023-01-06 — End: 2023-01-10

## 2023-01-06 MED ORDER — PREGABALIN 50 MG PO CAPS
ORAL_CAPSULE | ORAL | 4 refills | Status: DC
Start: 2023-01-06 — End: 2023-01-06

## 2023-01-06 NOTE — Telephone Encounter (Signed)
Pt called stating she is in a lot of pain, burning pain. States needs refills on this medications. Pregabalin 25 mg, Gabapentin 100 mg. States has appt in May, can't wait that long to get Rx.  1. Which medications need refilled? Pregabalin 25 mg  2. Which pharmacy/location is medication to be sent to? [facility] on [address]  3. Do they need a 30 day or 90 day supply? 90 day supply   1. Which medications need refilled? Gabapentin 100 mg  2. Which pharmacy/location is medication to be sent to? [facility] on [address]  3. Do they need a 30 day or 90 day supply? 90 day supply

## 2023-01-06 NOTE — Telephone Encounter (Signed)
Called and per Dr. Loleta Chance will change meds to 50mg  2xdaily .

## 2023-01-09 ENCOUNTER — Ambulatory Visit: Payer: Self-pay | Admitting: Licensed Clinical Social Worker

## 2023-01-09 ENCOUNTER — Telehealth: Payer: Self-pay | Admitting: Neurology

## 2023-01-09 NOTE — Patient Instructions (Signed)
Visit Information  Thank you for taking time to visit with me today. Please don't hesitate to contact me if I can be of assistance to you.   Following are the goals we discussed today:   Goals Addressed             This Visit's Progress    Patient is interested in program support. Would like to learn more about nursing support and SW support       Interventions  LCSW spoke via phone today with Sue [name], daughter  of client. LCSW and Sue [name] spoke about client needs.  [name] said client had recent eye procedure and was recuperating from eye procedure. [name] and LCSW spoke of program support for client with LCSW, RN and Pharmacist [name] and LCSW spoke of client completion of ADLs. [name] said previously that  client has home health aide who helps client weekly in the home as scheduled Encouraged Sue [name] or client to call LCSW as needed for SW support for client at [phone]          Our next appointment is by telephone on 01/30/23 at 9:30 AM   Please call the care guide team at [phone] if you need to cancel or reschedule your appointment.   If you are experiencing a Mental Health or Behavioral Health Crisis or need someone to talk to, please go to [facility] [address] [place] [phone])   The patient Sue [name], daughter, verbalized understanding of instructions, educational materials, and care plan provided today and DECLINED offer to receive copy of patient instructions, educational materials, and care plan.   The patient / Sue [name], daughter, has been provided with contact information for the care management team and has been advised to call with any health related questions or concerns.   Kelton Pillar.Mora Pedraza MSW, LCSW Licensed Visual merchandiser [facility] Care Management [phone]

## 2023-01-09 NOTE — Patient Outreach (Signed)
  Care Coordination   Follow Up Visit Note   01/09/2023 Name: Sue [name] MRN: [id number] DOB: [birth year]  Sue [name] is a 72 y.o. year old female who sees Sue [name], Sue Congress, DO for primary care. I spoke with  Sue [name] / Sue [name], daughter of patient, via phone today. .  What matters to the patients health and wellness today?  Client had recent eye procedure and is recuperating from recent eye procedure     Goals Addressed             This Visit's Progress    Patient is interested in program support. Would like to learn more about nursing support and SW support       Interventions  LCSW spoke via phone today with Sue [name], daughter  of client. LCSW and Sue [name] spoke about client needs.  Sue [name] said client had recent eye procedure and was recuperating from eye procedure. Sue [name] and LCSW spoke of program support for client with LCSW, RN and Pharmacist Sue [name] and LCSW spoke of client completion of ADLs. Sue [name] said previously that  client has home health aide who helps client weekly in the home as scheduled Encouraged Sue [name] or client to call LCSW as needed for SW support for client at [phone]          SDOH assessments and interventions completed:  Yes  SDOH Interventions Today    Flowsheet Row Most Recent Value  SDOH Interventions   Depression Interventions/Treatment  Medication  Physical Activity Interventions Other (Comments)  [walking challenges]  Stress Interventions Other (Comment)  [stress in managing medical needs]        Care Coordination Interventions:  Yes, provided    Interventions Today    Flowsheet Row Most Recent Value  Chronic Disease   Chronic disease during today's visit Other  [spoke with Sue [name], daughter of client, about client needs]  General Interventions   General Interventions Discussed/Reviewed General Interventions Discussed, Communication with  [spoke with daughter, Sue [name], about  program support for client]  Education Interventions   Education Provided Provided Education  Provided Sport and exercise psychologist Interventions   Safety Discussed/Reviewed Home Safety       Follow up plan: Follow up call scheduled for 01/30/23 at 9:30 AM    Encounter Outcome:  Pt. Visit Completed   Sue [name] MSW, LCSW Licensed Visual merchandiser [facility] Care Management [phone]

## 2023-01-09 NOTE — Telephone Encounter (Signed)
Pt called in and left a message with the access nurse. She is calling to get refills on her pregabalin 50mg . She thought it was sent on Friday. She is also having pain in her calf muscles on both legs and pain in her toes

## 2023-01-10 ENCOUNTER — Other Ambulatory Visit: Payer: Self-pay | Admitting: Neurology

## 2023-01-10 ENCOUNTER — Other Ambulatory Visit: Payer: Self-pay

## 2023-01-10 DIAGNOSIS — R209 Unspecified disturbances of skin sensation: Secondary | ICD-10-CM

## 2023-01-10 DIAGNOSIS — E1142 Type 2 diabetes mellitus with diabetic polyneuropathy: Secondary | ICD-10-CM

## 2023-01-10 MED ORDER — PREGABALIN 50 MG PO CAPS
ORAL_CAPSULE | ORAL | 4 refills | Status: DC
Start: 2023-01-10 — End: 2023-01-10

## 2023-01-10 MED ORDER — PREGABALIN 50 MG PO CAPS
50.0000 mg | ORAL_CAPSULE | Freq: Two times a day (BID) | ORAL | 5 refills | Status: DC
Start: 2023-01-10 — End: 2023-02-03

## 2023-01-10 MED ORDER — PREGABALIN 50 MG PO CAPS
50.0000 mg | ORAL_CAPSULE | Freq: Two times a day (BID) | ORAL | 5 refills | Status: DC
Start: 2023-01-10 — End: 2023-01-10

## 2023-01-10 NOTE — Telephone Encounter (Signed)
Called pharmacy and prescription did not go through so Dr. Loleta Chance resent it, Miss [place] called before I could call her and I reported that Dr. Loleta Chance resent it. She understood and will happy.

## 2023-01-13 ENCOUNTER — Other Ambulatory Visit: Payer: Self-pay | Admitting: Behavioral Health

## 2023-01-13 DIAGNOSIS — F3131 Bipolar disorder, current episode depressed, mild: Secondary | ICD-10-CM

## 2023-01-13 DIAGNOSIS — F331 Major depressive disorder, recurrent, moderate: Secondary | ICD-10-CM

## 2023-01-13 DIAGNOSIS — F411 Generalized anxiety disorder: Secondary | ICD-10-CM

## 2023-01-16 ENCOUNTER — Other Ambulatory Visit: Payer: Self-pay | Admitting: Family Medicine

## 2023-01-16 ENCOUNTER — Telehealth: Payer: Self-pay | Admitting: Behavioral Health

## 2023-01-16 NOTE — Telephone Encounter (Signed)
[facility] sent PA Request/Form for Zolpidem ER 12.5mg  , see form

## 2023-01-17 ENCOUNTER — Other Ambulatory Visit: Payer: Self-pay | Admitting: Family Medicine

## 2023-01-17 ENCOUNTER — Telehealth: Payer: Self-pay | Admitting: Family Medicine

## 2023-01-17 DIAGNOSIS — E876 Hypokalemia: Secondary | ICD-10-CM

## 2023-01-17 MED ORDER — POTASSIUM CHLORIDE CRYS ER 20 MEQ PO TBCR: 20.0000 meq | EXTENDED_RELEASE_TABLET | Freq: Two times a day (BID) | ORAL | 5 refills | Status: DC

## 2023-01-17 NOTE — Telephone Encounter (Signed)
Okay to fill? Last filled at hospital stay

## 2023-01-17 NOTE — Telephone Encounter (Signed)
Patient called regarding her med refills for Potassium Chloride, only has a couple left.  Thank you

## 2023-01-18 NOTE — Progress Notes (Deleted)
I saw Sue [name] in neurology clinic on 01/25/23 in follow up for neuropathy.  HPI: Sue [name] is a 72 y.o. year old female with a history of DM, HFpEF, hypothyroidism, HTN, COPD, depression, anxiety, possible TIA in 2016, and former smoker who we last saw on 06/09/22.  To briefly review: Symptoms started about 11/2021. She has a burning and stinging pain in her feet and legs. She feels like something is squeezing her legs. She has the feeling of [place]orses in her legs. She feels that the left side is worse than the right. She reports that her left leg pain is near constant while the right seems more intermittent. The pain is mostly from the calf down, but occasional cramping in her thighs. She denies back pain, neck pain, and only rare headaches. She denies numbness or tingling in her hands or arms. Patient walks with a walker for the last 5 years (~2018). She has not had any recent falls.   Patient saw PCP (Dr. Zola Button) on 03/14/22 and mentioned stinging and burning in legs that was worse in the evening. She denied back pain. Patient was tried on gabapentin 100 mg that did not help. She did not feel good on the medication, so she stopped it. She took it about a week. She was referred to neurology for further evaluation.   Patient is on seroquel 200 mg at night to help sleep.   She takes vitamin D and vitamin B12 supplements. She is also taking a nerve supplement from [employer] Isla Pence) that she thinks is helping.    The patient denies symptoms suggestive of oculobulbar weakness including diplopia, ptosis, dysphagia, poor saliva control, dysarthria/dysphonia, impaired mastication, facial weakness/droop.   The patient does not report symptoms referable to autonomic dysfunction including impaired sweating, heat or cold intolerance, excessive mucosal dryness, gastroparetic early satiety, postprandial abdominal bloating, constipation, bowel or bladder dyscontrol, or  syncope/presyncope/orthostatic intolerance.   She does not report any constitutional symptoms like fever, night sweats, anorexia or unintentional weight loss.   EtOH use: None  Restrictive diet? No Family history of neuropathy/myopathy/NM disease? None   She had not had any physical therapy recently (as of 06/09/22).  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 There may also be some contributions from peripheral edema related pain. I will check an EMG for severity and labs to look for other treatable causes of neuropathy.   PLAN: -Blood work: MMA, IFE, B6 -EMG: LLE > RLE (PN protocol) -Alpha lipoic acid 600 mg once or twice a day -Lidocaine cream PRN -Cramp OTC recommendations given (see AVS)  Since their last visit: MMA and B6 were normal. IFE was indeterminate. EMG on 11/27/21 showed a possible mild sensory predominant neuropathy (vs age related changes). Patient was not getting adequate relief from gabapentin, so was switched to Lyrica 25 mg BID on 11/30/22.   Patient was to have a virtual visit on 12/14/22, but technical problems did not allow [place] to connect. Per phone conversation, patient was doing okay without acute issues, so follow up was rescheduled.  Patient called on 01/06/23 still having leg pain. Lyrica was increased to 50 mg BID on 01/06/23. ***  Eating okay? Losing weight? (Need to check B1)?***     MEDICATIONS:  Outpatient Encounter Medications as of 01/25/2023  Medication Sig   Accu-Chek Softclix Lancets lancets USE 1  TO CHECK GLUCOSE 4 TIMES DAILY  albuterol (VENTOLIN HFA) 108 (90 Base) MCG/ACT inhaler Inhale 1-2 puffs into the lungs every 6 (six) hours as needed for wheezing or shortness of breath.   allopurinol (ZYLOPRIM) 100 MG tablet Take 100 mg by mouth daily as needed (for gout flareups).   Blood Glucose Monitoring Suppl (ACCU-CHEK GUIDE)  w/Device KIT USE   TO CHECK GLUCOSE UP TO 4 TIMES DAILY AS DIRECTED   busPIRone (BUSPAR) 15 MG tablet TAKE 1 TABLET BY MOUTH TWICE A DAY   Dulaglutide (TRULICITY) 3 MG/0.5ML SOPN Inject 3 mg as directed once a week.   famotidine (PEPCID) 20 MG tablet Take 20 mg by mouth daily as needed for heartburn or indigestion.   glucose blood (ACCU-CHEK GUIDE) test strip USE 1 STRIP TO CHECK GLUCOSE 4 TIMES DAILY   ibuprofen (ADVIL) 800 MG tablet Take 800 mg by mouth every 8 (eight) hours as needed for headache.   insulin glargine, 1 Unit Dial, (TOUJEO SOLOSTAR) 300 UNIT/ML Solostar Pen Inject 64 Units into the skin daily in the afternoon.   Insulin Pen Needle 29G X MISC 1 Device by Does not apply route daily in the afternoon.   levothyroxine (SYNTHROID) 88 MCG tablet Take 1 tablet (88 mcg total) by mouth daily.   LORazepam (ATIVAN) 1 MG tablet TAKE 1/2 TO 1 AND 1/2 TABLETS AT BEDTIME AND AN ADDITIONAL 1/2 TAB ONCE A DAY AS NEEDED FOR AXIETY   LORazepam (ATIVAN) 1 MG tablet TAKE 1/2 TO 1 AND 1/2 TABLETS AT BEDTIME AND AN ADDITIONAL 1/2 TAB ONCE A DAY AS NEEDED FOR AXIETY   Magnesium 400 MG CAPS Take 1 capsule by mouth daily.   metoprolol succinate (TOPROL-XL) 50 MG 24 hr tablet Take 2 tablets (100 mg total) by mouth daily. Take with or immediately following a meal   OXYGEN Inhale 3-4 L/min into the lungs continuous.    potassium chloride SA (KLOR-CON M) 20 MEQ tablet Take 1 tablet (20 mEq total) by mouth 2 (two) times daily.   pregabalin (LYRICA) 50 MG capsule Take 1 capsule (50 mg total) by mouth 2 (two) times daily.   QUEtiapine (SEROQUEL) 200 MG tablet Take 1 tablet (200 mg total) by mouth at bedtime.   rosuvastatin (CRESTOR) 10 MG tablet Take 1 tablet (10 mg total) by mouth daily. For heart health and to lower cholesterol.   torsemide (DEMADEX) 20 MG tablet Take 3 tablets by mouth once daily   zolpidem (AMBIEN CR) 12.5 MG CR tablet TAKE 1 TABLET BY MOUTH AT BEDTIME AS NEEDED FOR SLEEP   zolpidem  (AMBIEN CR) 12.5 MG CR tablet TAKE 1 TABLET BY MOUTH EVERY DAY AT BEDTIME AS NEEDED FOR SLEEP   No facility-administered encounter medications on file as of 01/25/2023.    PAST MEDICAL HISTORY: Past Medical History:  Diagnosis Date   Allergic rhinitis    Depression    Diabetes mellitus type 2, uncontrolled    Emphysema of lung (HCC)    3L home O2   GERD (gastroesophageal reflux disease)    Hypertension    Hypothyroidism    Obesity, morbid, BMI 50 or higher (HCC)    Stroke (HCC) 2016   TIA    Urine incontinence     PAST SURGICAL HISTORY: Past Surgical History:  Procedure Laterality Date   ABDOMINAL HYSTERECTOMY     CESAREAN SECTION     COLONOSCOPY N/A 08/19/2019   Procedure: COLONOSCOPY;  Surgeon: Vida Rigger, MD;  Location: [facility];  Service: Endoscopy;  Laterality: N/A;   COLONOSCOPY WITH  PROPOFOL N/A 08/05/2019   Procedure: COLONOSCOPY WITH PROPOFOL;  Surgeon: Charlott Rakes, MD;  Location: [facility];  Service: Gastroenterology;  Laterality: N/A;   COLONOSCOPY WITH PROPOFOL N/A 11/19/2019   Procedure: COLONOSCOPY WITH PROPOFOL;  Surgeon: Bernette Redbird, MD;  Location: [facility];  Service: Endoscopy;  Laterality: N/A;  Unprepped   COLONOSCOPY WITH PROPOFOL N/A 11/22/2019   Procedure: COLONOSCOPY WITH PROPOFOL;  Surgeon: Bernette Redbird, MD;  Location: [facility];  Service: Endoscopy;  Laterality: N/A;   COLONOSCOPY WITH PROPOFOL N/A 11/23/2019   Procedure: COLONOSCOPY WITH PROPOFOL;  Surgeon: Kerin Salen, MD;  Location: [facility];  Service: Gastroenterology;  Laterality: N/A;   COLONOSCOPY WITH PROPOFOL N/A 11/29/2019   Procedure: COLONOSCOPY WITH PROPOFOL;  Surgeon: Charlott Rakes, MD;  [facility]: [facility];  Service: Endoscopy;  Laterality: N/A;   ENTEROSCOPY N/A 11/24/2019   Procedure: ENTEROSCOPY;  Surgeon: Kerin Salen, MD;  Location: [facility];  Service: Gastroenterology;  Laterality: N/A;   ENTEROSCOPY N/A 11/27/2019   Procedure: ENTEROSCOPY;   Surgeon: Charlott Rakes, MD;  Location: [facility];  Service: Endoscopy;  Laterality: N/A;   ESOPHAGOGASTRODUODENOSCOPY N/A 11/27/2019   Procedure: ESOPHAGOGASTRODUODENOSCOPY (EGD);  Surgeon: Charlott Rakes, MD;  Location: [facility];  Service: Endoscopy;  Laterality: N/A;   ESOPHAGOGASTRODUODENOSCOPY (EGD) WITH PROPOFOL N/A 11/24/2019   Procedure: ESOPHAGOGASTRODUODENOSCOPY (EGD) WITH PROPOFOL;  Surgeon: Kerin Salen, MD;  [facility];  Service: Gastroenterology;  Laterality: N/A;  PUSH enteroscopy   GIVENS CAPSULE STUDY N/A 11/19/2019   Procedure: GIVENS CAPSULE STUDY;  Surgeon: Bernette Redbird, MD;  [facility];  Service: Endoscopy;  Laterality: N/A;  To be performed immediately following colonoscopy   GIVENS CAPSULE STUDY N/A 11/24/2019   Procedure: GIVENS CAPSULE STUDY;  Surgeon: Kerin Salen, MD;  [facility];  Service: Gastroenterology;  Laterality: N/A;   GIVENS CAPSULE STUDY N/A 11/28/2019   Procedure: GIVENS CAPSULE STUDY;  Surgeon: Charlott Rakes, MD;  [facility];  Service: Endoscopy;  Laterality: N/A;   HEMOSTASIS CLIP PLACEMENT  11/19/2019   Procedure: HEMOSTASIS CLIP PLACEMENT;  Surgeon: Bernette Redbird, MD;  [facility];  Service: Endoscopy;;   HEMOSTASIS CLIP PLACEMENT  11/22/2019   Procedure: HEMOSTASIS CLIP PLACEMENT;  Surgeon: Bernette Redbird, MD;  [facility]: [facility];  Service: Endoscopy;;   HOT HEMOSTASIS N/A 11/24/2019   Procedure: HOT HEMOSTASIS (ARGON PLASMA COAGULATION/BICAP);  Surgeon: Kerin Salen, MD;  Location: [facility];  Service: Gastroenterology;  Laterality: N/A;   HOT HEMOSTASIS N/A 11/27/2019   Procedure: HOT HEMOSTASIS (ARGON PLASMA COAGULATION/BICAP);  Surgeon: Charlott Rakes, MD;  Location: [facility];  Service: Endoscopy;  Laterality: N/A;   SUBMUCOSAL TATTOO INJECTION  11/19/2019   Procedure: SUBMUCOSAL TATTOO INJECTION;  Surgeon: Bernette Redbird, MD;  Location: [facility];  Service:  Endoscopy;;    ALLERGIES: Allergies  Allergen Reactions   Hydrocodone Other (See Comments)    Constipation and hallucinations   Norvasc [Amlodipine Besylate] Swelling and Other (See Comments)    Marked swelling of the limbs   Tizanidine Other (See Comments)    After the 3rd dose, the patient's mouth began to feel numb and she felt like she was a having a "hot flash"    FAMILY HISTORY: Family History  Problem Relation Age of Onset   Heart disease Father        MVP and Pics Valve   Hypertension Father    Depression Father        Institutionalized x's 2 years   Bipolar disorder Father    Hypertension Sister  Diabetes Sister    Hyperlipidemia Sister    Heart disease Sister 41       MI   Heart disease Brother    Hypertension Brother    Schizophrenia Paternal Aunt    Depression Paternal Aunt    Anxiety disorder Paternal Aunt    Heart disease Paternal Aunt    Schizophrenia Paternal Aunt    Heart disease Paternal Uncle    Heart disease Paternal Grandmother    Asthma Son    Asthma Son     SOCIAL HISTORY: Social History   Tobacco Use   Smoking status: Former    Packs/day: 1.00    Years: 40.00    Additional pack years: 0.00    Total pack years: 40.00    Types: Cigarettes    Start date: 07/21/1972    Quit date: 10/16/2011    Years since quitting: 11.2   Smokeless tobacco: Never  Vaping Use   Vaping Use: Never used  Substance Use Topics   Alcohol use: Not Currently    Comment: Occ-- Wine   Drug use: No   Social History   Social History Narrative      Originally from [place]. Has also lived in [place], [place]. She also previously lived in [place] for 20 years. No history of [place]ever. Moved to [place] in 1989. 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  No pets currently. No bird exposure. Questionable previous mold exposure in her daughter's home. 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       Lives in [place] alone in assisted living.  Has multiple chronic illness.      Right Handed    Lives in a one story home     Objective:  Vital Signs:  There were no vitals taken for this visit.  General:*** General appearance: Awake and alert. No distress. Cooperative with exam.  Skin: No obvious rash or jaundice. HEENT: Atraumatic. Anicteric. Lungs: Non-labored breathing on room air  Heart: Regular Abdomen: Soft, non tender. Extremities: No edema. No obvious deformity.  Musculoskeletal: No obvious joint swelling.  Neurological: Mental Status: Alert. Speech fluent. No pseudobulbar affect Cranial Nerves: CNII: No RAPD. Visual fields intact. CNIII, IV, VI: PERRL. No nystagmus. EOMI. CN V: Facial sensation intact bilaterally to fine touch. [place] clench strong. Jaw jerk***. CN VII: Facial muscles symmetric and strong. No ptosis at rest or after sustained upgaze***. CN VIII: Hears finger rub well bilaterally. CN IX: No hypophonia. CN X: Palate elevates symmetrically. CN XI: Full strength shoulder shrug bilaterally. CN XII: Tongue protrusion full and midline. No atrophy or fasciculations. No significant dysarthria*** Motor: Tone is ***. *** fasciculations in *** extremities. *** atrophy. No grip or percussive myotonia.  Individual muscle group testing (MRC grade out of 5):  Movement     Neck flexion ***    Neck extension ***     Right Left   Shoulder abduction *** ***   Shoulder adduction *** ***   Shoulder ext rotation *** ***   Shoulder int rotation *** ***   Elbow flexion *** ***   Elbow extension *** ***   Wrist extension *** ***   Wrist flexion *** ***   Finger abduction - FDI *** ***   Finger abduction - ADM *** ***  Finger extension *** ***   Finger distal flexion - 2/3 *** ***   Finger distal  flexion - 4/5 *** ***   Thumb flexion - FPL *** ***   Thumb abduction - APB *** ***    Hip flexion *** ***   Hip extension *** ***   Hip adduction *** ***   Hip abduction *** ***   Knee extension *** ***   Knee flexion *** ***   Dorsiflexion *** ***   Plantarflexion *** ***   Inversion *** ***   Eversion *** ***   Great toe extension *** ***   Great toe flexion *** ***     Reflexes:  Right Left  Bicep *** ***  Tricep *** ***  BrRad *** ***  Knee *** ***  Ankle *** ***   Pathological Reflexes: Babinski: *** response bilaterally*** Hoffman: *** Troemner: *** Pectoral: *** Palmomental: *** Facial: *** Midline tap: *** Sensation: Pinprick: *** Vibration: *** Temperature: *** Proprioception: *** Coordination: Intact finger-to- nose-finger and heel-to-shin bilaterally. Romberg negative.*** Gait: Able to rise from chair with arms crossed unassisted. Normal, narrow-based gait. Able to tandem walk. Able to walk on toes and heels.***   Lab and Test Review: New results: 06/09/22: IFE: poorly defined area of restricted protein mobility (reactive with IgG and kappa) B6 wnl MMA wnl  TSH (09/01/22): 5.455 BMP (10/28/22): significant for K 3.0, glucose 382, Cr 1.95 CMP (12/23/22): K of 2.9, glucose 431, Cr 1.76, AST 47, ALT 23 CBC (10/28/22): unremarkable  EMG (11/28/22): NCV & EMG Findings: Extensive electrodiagnostic evaluation of the left lower limb with additional nerve conduction studies of the left upper limb shows: Left sural and superficial peroneal/fibular sensory responses are absent. Left median, ulnar, and radial sensory responses are within normal limits. Left peroneal/fibular (EDB), tibial (AH), median (APB), and ulnar (ADM) motor responses are within normal limits. Left H reflex is absent. There is no evidence of active or chronic motor axon loss changes affecting any of the tested muscles on needle examination. Motor unit configuration and recruitment pattern  is within normal limits.   Impression: The findings above are most consistent with the following: Absent lower limb sensory responses and H reflex may be normal at this age or represent evidence of a large fiber sensory predominant neuropathy. No electrodiagnostic evidence of a left lumbosacral (L3-S1) motor radiculopathy. No electrodiagnostic evidence of a left median mononeuropathy at or distal to the wrist (ie: carpal tunnel syndrome). Screening studies for left ulnar and radial mononeuropathies are normal.  CT head wo contrast (for mental status change in setting of hyperglycemia) (10/28/22): FINDINGS: Brain: No acute territorial infarction, hemorrhage or intracranial mass. Mild atrophy. Nonenlarged ventricles.   Vascular: No hyperdense vessels.  Carotid vascular calcification.   Skull: Normal. Negative for fracture or focal lesion.   Sinuses/Orbits: Old fracture deformity medial wall left orbit. Moderate mucosal thickening in the right maxillary sinus with scattered calcific deposits.   Other: None.   IMPRESSION: No CT evidence for acute intracranial abnormality. Mild atrophy.  Previously reviewed results: Normal or unremarkable:  03/14/22: TSH: 15.21 HbA1c: 7.7 CMP significant for elevated glucose (201) and Cr (1.38) CBC significant for elevated MCV (106.3) B12 (01/07/20): 448   MRI brain wo contrast (01/01/20): CLINICAL DATA:  TIA. Transient left facial numbness and slurred speech.   EXAM: MRI HEAD WITHOUT CONTRAST   TECHNIQUE: Multiplanar, multiecho pulse sequences of the brain and surrounding structures were obtained without intravenous contrast.   COMPARISON:  Head CT 01/01/2020 and MRI 07/30/2014  FINDINGS: Brain: There is no evidence of acute infarct, intracranial hemorrhage, mass, midline shift, or extra-axial fluid collection. The ventricles and sulci are normal. T2 hyperintensities in the cerebral white matter bilaterally are unchanged from the prior  MRI and nonspecific but compatible with minimal chronic small vessel ischemic disease.   Vascular: Major intracranial vascular flow voids are preserved.   Skull and upper cervical spine: Unremarkable bone marrow signal.   Sinuses/Orbits: Remote medial left orbital fracture. Chronic right sphenoid sinusitis. Clear mastoid air cells.   Other: None.   IMPRESSION: 1. No acute intracranial abnormality. 2. Minimal chronic small vessel ischemic disease.  ASSESSMENT: This is KYLAYA VARIO, a 72 y.o. female with:  ***  Plan: ***Repeat IFE -Continue Lyrica 25 mg BID*** -Lidocaine cream PRN***  Return to clinic in ***  Total time spent reviewing records, interview, history/exam, documentation, and coordination of care on day of encounter:  *** min  Jacquelyne Balint, MD

## 2023-01-18 NOTE — Telephone Encounter (Signed)
Noted  

## 2023-01-19 ENCOUNTER — Other Ambulatory Visit: Payer: Self-pay | Admitting: Family Medicine

## 2023-01-19 ENCOUNTER — Other Ambulatory Visit: Payer: Self-pay

## 2023-01-19 DIAGNOSIS — E876 Hypokalemia: Secondary | ICD-10-CM

## 2023-01-20 NOTE — Telephone Encounter (Signed)
Form completed and faxed back

## 2023-01-25 ENCOUNTER — Ambulatory Visit: Payer: Medicare HMO | Admitting: Neurology

## 2023-01-25 ENCOUNTER — Encounter: Payer: Self-pay | Admitting: Neurology

## 2023-01-25 DIAGNOSIS — Z029 Encounter for administrative examinations, unspecified: Secondary | ICD-10-CM

## 2023-01-27 ENCOUNTER — Ambulatory Visit (INDEPENDENT_AMBULATORY_CARE_PROVIDER_SITE_OTHER): Payer: Medicare HMO | Admitting: *Deleted

## 2023-01-27 DIAGNOSIS — Z0000 Encounter for general adult medical examination without abnormal findings: Secondary | ICD-10-CM

## 2023-01-27 NOTE — Progress Notes (Signed)
Subjective:   Sue [name] is a 72 y.o. female who presents for Medicare Annual (Subsequent) preventive examination.  I connected with  [name] on 01/27/23 by a audio enabled telemedicine application and verified that I am speaking with the correct person using two identifiers.  Patient Location: Home  Provider Location: Office/Clinic  I discussed the limitations of evaluation and management by telemedicine. The patient expressed understanding and agreed to proceed.   Review of Systems     Cardiac Risk Factors include: advanced age (>59men, >19 women);diabetes mellitus;dyslipidemia;hypertension;obesity (BMI >30kg/m2);smoking/ tobacco exposure     Objective:    There were no vitals filed for this visit. There is no height or weight on file to calculate BMI.     01/27/2023    9:02 AM 12/23/2022   10:56 AM 10/28/2022    5:20 PM 06/09/2022    1:14 PM 01/24/2022    1:09 PM 11/13/2019    2:04 PM 08/16/2019    9:32 AM  Advanced Directives  Does Patient Have a Medical Advance Directive? No No No No Yes No No  Type of Psychiatric nurse of Healthcare Power of Attorney in Chart?     No - copy requested    Would patient like information on creating a medical advance directive? No - Patient declined No - Patient declined    No - Patient declined No - Patient declined    Current Medications (verified) Outpatient Encounter Medications as of 01/27/2023  Medication Sig   Accu-Chek Softclix Lancets lancets USE 1  TO CHECK GLUCOSE 4 TIMES DAILY   albuterol (VENTOLIN HFA) 108 (90 Base) MCG/ACT inhaler Inhale 1-2 puffs into the lungs every 6 (six) hours as needed for wheezing or shortness of breath.   allopurinol (ZYLOPRIM) 100 MG tablet Take 100 mg by mouth daily as needed (for gout flareups).   Blood Glucose Monitoring Suppl (ACCU-CHEK GUIDE) w/Device KIT USE   TO CHECK GLUCOSE UP TO 4 TIMES DAILY AS DIRECTED   busPIRone (BUSPAR) 15 MG tablet TAKE  1 TABLET BY MOUTH TWICE A DAY   Dulaglutide (TRULICITY) 3 MG/0.5ML SOPN Inject 3 mg as directed once a week.   famotidine (PEPCID) 20 MG tablet Take 20 mg by mouth daily as needed for heartburn or indigestion.   glucose blood (ACCU-CHEK GUIDE) test strip USE 1 STRIP TO CHECK GLUCOSE 4 TIMES DAILY   ibuprofen (ADVIL) 800 MG tablet Take 800 mg by mouth every 8 (eight) hours as needed for headache.   insulin glargine, 1 Unit Dial, (TOUJEO SOLOSTAR) 300 UNIT/ML Solostar Pen Inject 64 Units into the skin daily in the afternoon.   Insulin Pen Needle 29G X MISC 1 Device by Does not apply route daily in the afternoon.   levothyroxine (SYNTHROID) 88 MCG tablet Take 1 tablet (88 mcg total) by mouth daily.   LORazepam (ATIVAN) 1 MG tablet TAKE 1/2 TO 1 AND 1/2 TABLETS AT BEDTIME AND AN ADDITIONAL 1/2 TAB ONCE A DAY AS NEEDED FOR AXIETY   Magnesium 400 MG CAPS Take 1 capsule by mouth daily.   metoprolol succinate (TOPROL-XL) 50 MG 24 hr tablet Take 2 tablets (100 mg total) by mouth daily. Take with or immediately following a meal   OXYGEN Inhale 3-4 L/min into the lungs continuous.    potassium chloride SA (KLOR-CON M) 20 MEQ tablet Take 1 tablet by mouth once daily   pregabalin (LYRICA) 50 MG capsule Take  1 capsule (50 mg total) by mouth 2 (two) times daily.   QUEtiapine (SEROQUEL) 200 MG tablet Take 1 tablet (200 mg total) by mouth at bedtime.   rosuvastatin (CRESTOR) 10 MG tablet Take 1 tablet (10 mg total) by mouth daily. For heart health and to lower cholesterol.   torsemide (DEMADEX) 20 MG tablet Take 3 tablets by mouth once daily   zolpidem (AMBIEN CR) 12.5 MG CR tablet TAKE 1 TABLET BY MOUTH AT BEDTIME AS NEEDED FOR SLEEP   [DISCONTINUED] LORazepam (ATIVAN) 1 MG tablet TAKE 1/2 TO 1 AND 1/2 TABLETS AT BEDTIME AND AN ADDITIONAL 1/2 TAB ONCE A DAY AS NEEDED FOR AXIETY   [DISCONTINUED] zolpidem (AMBIEN CR) 12.5 MG CR tablet TAKE 1 TABLET BY MOUTH EVERY DAY AT BEDTIME AS NEEDED FOR SLEEP   No  facility-administered encounter medications on file as of 01/27/2023.    Allergies (verified) Hydrocodone, Norvasc [amlodipine besylate], and Tizanidine   History: Past Medical History:  Diagnosis Date   Allergic rhinitis    Depression    Diabetes mellitus type 2, uncontrolled    Emphysema of lung (HCC)    3L home O2   GERD (gastroesophageal reflux disease)    Hypertension    Hypothyroidism    Obesity, morbid, BMI 50 or higher (HCC)    Stroke (HCC) 2016   TIA    Urine incontinence    Past Surgical History:  Procedure Laterality Date   ABDOMINAL HYSTERECTOMY     CESAREAN SECTION     COLONOSCOPY N/A 08/19/2019   Procedure: COLONOSCOPY;  Surgeon: Vida Rigger, MD;  Location: [facility];  Service: Endoscopy;  Laterality: N/A;   COLONOSCOPY WITH PROPOFOL N/A 08/05/2019   Procedure: COLONOSCOPY WITH PROPOFOL;  Surgeon: Charlott Rakes, MD;  Location: [facility];  Service: Gastroenterology;  Laterality: N/A;   COLONOSCOPY WITH PROPOFOL N/A 11/19/2019   Procedure: COLONOSCOPY WITH PROPOFOL;  Surgeon: Bernette Redbird, MD;  Location: [facility];  Service: Endoscopy;  Laterality: N/A;  Unprepped   COLONOSCOPY WITH PROPOFOL N/A 11/22/2019   Procedure: COLONOSCOPY WITH PROPOFOL;  Surgeon: Bernette Redbird, MD;  Location: [facility];  Service: Endoscopy;  Laterality: N/A;   COLONOSCOPY WITH PROPOFOL N/A 11/23/2019   Procedure: COLONOSCOPY WITH PROPOFOL;  Surgeon: Kerin Salen, MD;  [facility]: [facility];  Service: Gastroenterology;  Laterality: N/A;   COLONOSCOPY WITH PROPOFOL N/A 11/29/2019   Procedure: COLONOSCOPY WITH PROPOFOL;  Surgeon: Charlott Rakes, MD;  [facility];  Service: Endoscopy;  Laterality: N/A;   ENTEROSCOPY N/A 11/24/2019   Procedure: ENTEROSCOPY;  Surgeon: Kerin Salen, MD;  [facility]: [facility];  Service: Gastroenterology;  Laterality: N/A;   ENTEROSCOPY N/A 11/27/2019   Procedure: ENTEROSCOPY;  Surgeon: Charlott Rakes, MD;  [facility];   Service: Endoscopy;  Laterality: N/A;   ESOPHAGOGASTRODUODENOSCOPY N/A 11/27/2019   Procedure: ESOPHAGOGASTRODUODENOSCOPY (EGD);  Surgeon: Charlott Rakes, MD;  Location: [facility];  Service: Endoscopy;  Laterality: N/A;   ESOPHAGOGASTRODUODENOSCOPY (EGD) WITH PROPOFOL N/A 11/24/2019   Procedure: ESOPHAGOGASTRODUODENOSCOPY (EGD) WITH PROPOFOL;  Surgeon: Kerin Salen, MD;  [facility];  Service: Gastroenterology;  Laterality: N/A;  PUSH enteroscopy   GIVENS CAPSULE STUDY N/A 11/19/2019   Procedure: GIVENS CAPSULE STUDY;  Surgeon: Bernette Redbird, MD;  [facility];  Service: Endoscopy;  Laterality: N/A;  To be performed immediately following colonoscopy   GIVENS CAPSULE STUDY N/A 11/24/2019   Procedure: GIVENS CAPSULE STUDY;  Surgeon: Kerin Salen, MD;  [facility];  Service: Gastroenterology;  Laterality: N/A;   GIVENS CAPSULE STUDY N/A  11/28/2019   Procedure: GIVENS CAPSULE STUDY;  Surgeon: Charlott Rakes, MD;  [facility];  Service: Endoscopy;  Laterality: N/A;   HEMOSTASIS CLIP PLACEMENT  11/19/2019   Procedure: HEMOSTASIS CLIP PLACEMENT;  Surgeon: Bernette Redbird, MD;  [facility];  Service: Endoscopy;;   HEMOSTASIS CLIP PLACEMENT  11/22/2019   Procedure: HEMOSTASIS CLIP PLACEMENT;  Surgeon: Bernette Redbird, MD;  [facility];  Service: Endoscopy;;   HOT HEMOSTASIS N/A 11/24/2019   Procedure: HOT HEMOSTASIS (ARGON PLASMA COAGULATION/BICAP);  Surgeon: Kerin Salen, MD;  Location: [facility];  Service: Gastroenterology;  Laterality: N/A;   HOT HEMOSTASIS N/A 11/27/2019   Procedure: HOT HEMOSTASIS (ARGON PLASMA COAGULATION/BICAP);  Surgeon: Charlott Rakes, MD;  Location: [facility];  Service: Endoscopy;  Laterality: N/A;   SUBMUCOSAL TATTOO INJECTION  11/19/2019   Procedure: SUBMUCOSAL TATTOO INJECTION;  Surgeon: Bernette Redbird, MD;  [facility];  Service: Endoscopy;;   Family History  Problem Relation Age of Onset    Heart disease Father        MVP and Pics Valve   Hypertension Father    Depression Father        Institutionalized x's 2 years   Bipolar disorder Father    Hypertension Sister    Diabetes Sister    Hyperlipidemia Sister    Heart disease Sister 70       MI   Heart disease Brother    Hypertension Brother    Schizophrenia Paternal Aunt    Depression Paternal Aunt    Anxiety disorder Paternal Aunt    Heart disease Paternal Aunt    Schizophrenia Paternal Aunt    Heart disease Paternal Uncle    Heart disease Paternal Grandmother    Asthma Son    Asthma Son    Social History   Socioeconomic History   Marital status: Divorced    Spouse name: Not on file   Number of children: 4   Years of education: 14   Highest education level: Associate degree: academic program  Occupational History   Occupation: disabled- Biochemist, clinical Med  Tobacco Use   Smoking status: Former    Packs/day: 1.00    Years: 40.00    Additional pack years: 0.00    Total pack years: 40.00    Types: Cigarettes    Start date: 07/21/1972    Quit date: 10/16/2011    Years since quitting: 11.2   Smokeless tobacco: Never  Vaping Use   Vaping Use: Never used  Substance and Sexual Activity   Alcohol use: Not Currently    Comment: Occ-- Wine   Drug use: No   Sexual activity: Not Currently  Other Topics Concern   Not on file  Social History Narrative      Originally from [place]. Has also lived in [place], [place]. She also previously lived in [place] for 20 years. No history of Valley Fever. Moved to [place] in 1989. 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 No pets currently. No bird exposure. Questionable previous mold exposure in her daughter's home. 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        Lives in [place] alone in assisted living.  Has multiple chronic illness.      Right Handed    Lives in a one story  home    Social Determinants of Health   Financial Resource Strain: Low Risk  (01/24/2022)   Overall Financial Resource Strain (CARDIA)    Difficulty of Paying Living Expenses: Not hard at all  Food Insecurity: No Food Insecurity (01/27/2023)   Hunger Vital Sign    Worried About Running Out of Food in the Last Year: Never true    Ran Out of Food in the Last Year: Never true  Transportation Needs: No Transportation Needs (01/27/2023)   PRAPARE - Administrator, Civil Service (Medical): No    Lack of Transportation (Non-Medical): No  Physical Activity: Inactive (01/09/2023)   Exercise Vital Sign    Days of Exercise per Week: 0 days    Minutes of Exercise per Session: 0 min  Stress: Stress Concern Present (01/09/2023)   [employer] of Occupational Health - Occupational Stress Questionnaire    Feeling of Stress : To some extent  Social Connections: Moderately Integrated (01/24/2022)   Social Connection and Isolation Panel [NHANES]    Frequency of Communication with Friends and Family: More than three times a week    Frequency of Social Gatherings with Friends and Family: Three times a week    Attends Religious Services: 1 to 4 times per year    Active Member of Clubs or Organizations: Yes    Attends Engineer, structural: More than 4 times per year    Marital Status: Divorced    Tobacco Counseling Counseling given: Not Answered   Clinical Intake:  Pre-visit preparation completed: Yes  Pain : No/denies pain  Nutritional Risks: None Diabetes: Yes CBG done?: No Did pt. bring in CBG monitor from home?: No  How often do you need to have someone help you when you read instructions, pamphlets, or other written materials from your doctor or pharmacy?: 1 - Never   Activities of Daily Living    01/27/2023    9:08 AM  In your present state  of health, do you have any difficulty performing the following activities:  Hearing? 0  Vision? 0  Difficulty concentrating or making decisions? 1  Walking or climbing stairs? 1  Dressing or bathing? 0  Doing errands, shopping? 1  Comment pt does not drive  Preparing Food and eating ? N  Using the Toilet? N  In the past six months, have you accidently leaked urine? Y  Do you have problems with loss of bowel control? N  Managing your Medications? N  Managing your Finances? N  Housekeeping or managing your Housekeeping? Y    Patient Care Team: Zola Button, Grayling Congress, DO as PCP - General (Family Medicine) Thomasene Ripple, DO as PCP - Cardiology (Cardiology) Barbette Hair, MD as Referring Physician (Internal Medicine) Barbette Hair, MD as Referring Physician (Internal Medicine) Zola Button, Grayling Congress, DO (Family Medicine) Jake Bathe, MD as Consulting Physician (Cardiology) Antony Madura, MD as Consulting Physician (Neurology)  Indicate any recent Medical Services you may have received from other than [facility] providers in the past year (date may be approximate).     Assessment:   This is a routine wellness examination for Sue [name].  Hearing/Vision screen No results found.  Dietary issues and exercise activities discussed: Current Exercise Habits: The patient does not participate in regular exercise at present, Exercise limited by: orthopedic condition(s);respiratory conditions(s)   Goals Addressed   None    Depression Screen    01/27/2023    9:08 AM 01/09/2023   10:57 AM  11/29/2022    3:03 PM 10/24/2022    1:52 PM 09/26/2022    4:57 PM 01/24/2022    1:14 PM 02/01/2021    9:22 AM  PHQ 2/9 Scores  PHQ - 2 Score 0 2 2 2 2  0 0  PHQ- 9 Score  7 7 8 8       Fall Risk    01/27/2023    9:05 AM 06/09/2022    1:14 PM 01/24/2022    1:11 PM 07/16/2021   11:14 AM 05/28/2020   11:04 AM  Fall Risk   Falls in the past year? 0 0 0 1 1  Number falls in past yr: 0 0 0 0 0   Injury with Fall? 0 0 0 0 0  Risk for fall due to : Impaired balance/gait   Impaired balance/gait;Impaired mobility History of fall(s);Impaired balance/gait  Follow up Falls evaluation completed  Falls prevention discussed Falls evaluation completed Falls evaluation completed    FALL RISK PREVENTION PERTAINING TO THE HOME:  Any stairs in or around the home? No  Home free of loose throw rugs in walkways, pet beds, electrical cords, etc? Yes  Adequate lighting in your home to reduce risk of falls? Yes   ASSISTIVE DEVICES UTILIZED TO PREVENT FALLS:  Life alert? No  Use of a cane, walker or w/c? Yes  Grab bars in the bathroom? Yes  Shower chair or bench in shower? Yes  Elevated toilet seat or a handicapped toilet? No   TIMED UP AND GO:  Was the test performed?  No, audio visit .    Cognitive Function:        01/27/2023    9:14 AM  6CIT Screen  What time? 0 points  Count back from 20 0 points  Months in reverse 4 points  Repeat phrase 2 points    Immunizations Immunization History  Administered Date(s) Administered   Fluad Quad(high Dose 65+) 05/28/2019, 05/28/2020, 07/16/2021   Influenza, High Dose Seasonal PF 08/18/2018   Influenza,inj,Quad PF,6+ Mos 08/21/2014, 10/27/2015, 08/23/2016, 05/11/2017   Influenza-Unspecified 09/19/2011, 06/12/2013   PFIZER(Purple Top)SARS-COV-2 Vaccination 01/11/2020, 03/17/2020   Pneumococcal Conjugate-13 04/06/2017   Pneumococcal Polysaccharide-23 02/17/2012, 09/12/2012, 05/28/2020   Tdap 09/12/2009, 02/17/2012    TDAP status: Due, Education has been provided regarding the importance of this vaccine. Advised may receive this vaccine at local pharmacy or Health Dept. Aware to provide a copy of the vaccination record if obtained from local pharmacy or Health Dept. Verbalized acceptance and understanding.  Flu Vaccine status: Up to date  Pneumococcal vaccine status: Up to date  Covid-19 vaccine status: Information provided on how to  obtain vaccines.   Qualifies for Shingles Vaccine? Yes   Zostavax completed No   Shingrix Completed?: No.    Education has been provided regarding the importance of this vaccine. Patient has been advised to call insurance company to determine out of pocket expense if they have not yet received this vaccine. Advised may also receive vaccine at local pharmacy or Health Dept. Verbalized acceptance and understanding.  Screening Tests Health Maintenance  Topic Date Due   OPHTHALMOLOGY EXAM  Never done   Zoster Vaccines- Shingrix (1 of 2) Never done   MAMMOGRAM  01/01/2015   DEXA SCAN  Never done   Lung Cancer Screening  11/12/2020   DTaP/Tdap/Td (3 - Td or Tdap) 02/16/2022   HEMOGLOBIN A1C  09/14/2022   Medicare Annual Wellness (AWV)  01/25/2023   Diabetic kidney evaluation - Urine ACR  03/15/2023  INFLUENZA VACCINE  04/13/2023   FOOT EXAM  04/30/2023   Diabetic kidney evaluation - eGFR measurement  12/23/2023   COLONOSCOPY (Pts 45-25yrs Insurance coverage will need to be confirmed)  11/28/2029   Pneumonia Vaccine 54+ Years old  Completed   Hepatitis C Screening  Completed   HPV VACCINES  Aged Out   COVID-19 Vaccine  Discontinued    Health Maintenance  Health Maintenance Due  Topic Date Due   OPHTHALMOLOGY EXAM  Never done   Zoster Vaccines- Shingrix (1 of 2) Never done   MAMMOGRAM  01/01/2015   DEXA SCAN  Never done   Lung Cancer Screening  11/12/2020   DTaP/Tdap/Td (3 - Td or Tdap) 02/16/2022   HEMOGLOBIN A1C  09/14/2022   Medicare Annual Wellness (AWV)  01/25/2023    Colorectal cancer screening: Type of screening: Colonoscopy. Completed 11/29/19. Repeat every 10 years  Mammogram status: Ordered previously. Pt provided with contact info and advised to call to schedule appt.   Bone Density status: Ordered previously. Pt provided with contact info and advised to call to schedule appt.  Lung Cancer Screening: (Low Dose CT Chest recommended if Age 79-80 years, 30 pack-year  currently smoking OR have quit w/in 15years.) does qualify.   Lung Cancer Screening Referral: has pulmonologist  Additional Screening:  Hepatitis C Screening: does qualify; Completed 03/14/22  Vision Screening: Recommended annual ophthalmology exams for early detection of glaucoma and other disorders of the eye. Is the patient up to date with their annual eye exam?  Yes  Who is the provider or what is the name of the office in which the patient attends annual eye exams? Doesn't remember name at this time If pt is not established with a provider, would they like to be referred to a provider to establish care? No .   Dental Screening: Recommended annual dental exams for proper oral hygiene  Community Resource Referral / Chronic Care Management: CRR required this visit?  No   CCM required this visit?  No      Plan:     I have personally reviewed and noted the following in the patient's chart:   Medical and social history Use of alcohol, tobacco or illicit drugs  Current medications and supplements including opioid prescriptions. Patient is not currently taking opioid prescriptions. Functional ability and status Nutritional status Physical activity Advanced directives List of other physicians Hospitalizations, surgeries, and ER visits in previous 12 months Vitals Screenings to include cognitive, depression, and falls Referrals and appointments  In addition, I have reviewed and discussed with patient certain preventive protocols, quality metrics, and best practice recommendations. A written personalized care plan for preventive services as well as general preventive health recommendations were provided to patient.   Due to this being a telephonic visit, the after visit summary with patients personalized plan was offered to patient via mail or my-chart. Patient would like to access on my-chart.  Donne Anon, [place]   01/27/2023   Nurse Notes: None

## 2023-01-27 NOTE — Patient Instructions (Signed)
Sue [name] , Thank you for taking time to come for your Medicare Wellness Visit. I appreciate your ongoing commitment to your health goals. Please review the following plan we discussed and let me know if I can assist you in the future.      This is a list of the screening recommended for you and due dates:  Health Maintenance  Topic Date Due   Eye exam for diabetics  Never done   Zoster (Shingles) Vaccine (1 of 2) Never done   Mammogram  01/01/2015   DEXA scan (bone density measurement)  Never done   Screening for Lung Cancer  11/12/2020   DTaP/Tdap/Td vaccine (3 - Td or Tdap) 02/16/2022   Hemoglobin A1C  09/14/2022   Yearly kidney health urinalysis for diabetes  03/15/2023   Flu Shot  04/13/2023   Complete foot exam   04/30/2023   Yearly kidney function blood test for diabetes  12/23/2023   Medicare Annual Wellness Visit  01/27/2024   Colon Cancer Screening  11/28/2029   Pneumonia Vaccine  Completed   Hepatitis C Screening: USPSTF Recommendation to screen - Ages 18-79 yo.  Completed   HPV Vaccine  Aged Out   COVID-19 Vaccine  Discontinued    Next appointment: Follow up in one year for your annual wellness visit.   Preventive Care 71 Years and Older, Female Preventive care refers to lifestyle choices and visits with your health care provider that can promote health and wellness. What does preventive care include? A yearly physical exam. This is also called an annual well check. Dental exams once or twice a year. Routine eye exams. Ask your health care provider how often you should have your eyes checked. Personal lifestyle choices, including: Daily care of your teeth and gums. Regular physical activity. Eating a healthy diet. Avoiding tobacco and drug use. Limiting alcohol use. Practicing safe sex. Taking low-dose aspirin every day. Taking vitamin and mineral supplements as recommended by your health care provider. What happens during an annual well check? The  services and screenings done by your health care provider during your annual well check will depend on your age, overall health, lifestyle risk factors, and family history of disease. Counseling  Your health care provider may ask you questions about your: Alcohol use. Tobacco use. Drug use. Emotional well-being. Home and relationship well-being. Sexual activity. Eating habits. History of falls. Memory and ability to understand (cognition). Work and work Astronomer. Reproductive health. Screening  You may have the following tests or measurements: Height, weight, and BMI. Blood pressure. Lipid and cholesterol levels. These may be checked every 5 years, or more frequently if you are over 31 years old. Skin check. Lung cancer screening. You may have this screening every year starting at age 71 if you have a 30-pack-year history of smoking and currently smoke or have quit within the past 15 years. Fecal occult blood test (FOBT) of the stool. You may have this test every year starting at age 71. Flexible sigmoidoscopy or colonoscopy. You may have a sigmoidoscopy every 5 years or a colonoscopy every 10 years starting at age 71. Hepatitis C blood test. Hepatitis B blood test. Sexually transmitted disease (STD) testing. Diabetes screening. This is done by checking your blood sugar (glucose) after you have not eaten for a while (fasting). You may have this done every 71-3 years. Bone density scan. This is done to screen for osteoporosis. You may have this done starting at age 71. Mammogram. This may be done every 1-2  years. Talk to your health care provider about how often you should have regular mammograms. Talk with your health care provider about your test results, treatment options, and if necessary, the need for more tests. Vaccines  Your health care provider may recommend certain vaccines, such as: Influenza vaccine. This is recommended every year. Tetanus, diphtheria, and acellular  pertussis (Tdap, Td) vaccine. You may need a Td booster every 10 years. Zoster vaccine. You may need this after age 54. Pneumococcal 13-valent conjugate (PCV13) vaccine. One dose is recommended after age 30. Pneumococcal polysaccharide (PPSV23) vaccine. One dose is recommended after age 31. Talk to your health care provider about which screenings and vaccines you need and how often you need them. This information is not intended to replace advice given to you by your health care provider. Make sure you discuss any questions you have with your health care provider. Document Released: 09/25/2015 Document Revised: 05/18/2016 Document Reviewed: 06/30/2015 Elsevier Interactive Patient Education  2017 [employer].  Fall Prevention in the Home Falls can cause injuries. They can happen to people of all ages. There are many things you can do to make your home safe and to help prevent falls. What can I do on the outside of my home? Regularly fix the edges of walkways and driveways and fix any cracks. Remove anything that might make you trip as you walk through a door, such as a raised step or threshold. Trim any bushes or trees on the path to your home. Use bright outdoor lighting. Clear any walking paths of anything that might make someone trip, such as rocks or tools. Regularly check to see if handrails are loose or broken. Make sure that both sides of any steps have handrails. Any raised decks and porches should have guardrails on the edges. Have any leaves, snow, or ice cleared regularly. Use sand or salt on walking paths during winter. Clean up any spills in your garage right away. This includes oil or grease spills. What can I do in the bathroom? Use night lights. Install grab bars by the toilet and in the tub and shower. Do not use towel bars as grab bars. Use non-skid mats or decals in the tub or shower. If you need to sit down in the shower, use a plastic, non-slip stool. Keep the floor  dry. Clean up any water that spills on the floor as soon as it happens. Remove soap buildup in the tub or shower regularly. Attach bath mats securely with double-sided non-slip rug tape. Do not have throw rugs and other things on the floor that can make you trip. What can I do in the bedroom? Use night lights. Make sure that you have a light by your bed that is easy to reach. Do not use any sheets or blankets that are too big for your bed. They should not hang down onto the floor. Have a firm chair that has side arms. You can use this for support while you get dressed. Do not have throw rugs and other things on the floor that can make you trip. What can I do in the kitchen? Clean up any spills right away. Avoid walking on wet floors. Keep items that you use a lot in easy-to-reach places. If you need to reach something above you, use a strong step stool that has a grab bar. Keep electrical cords out of the way. Do not use floor polish or wax that makes floors slippery. If you must use wax, use non-skid floor  wax. Do not have throw rugs and other things on the floor that can make you trip. What can I do with my stairs? Do not leave any items on the stairs. Make sure that there are handrails on both sides of the stairs and use them. Fix handrails that are broken or loose. Make sure that handrails are as long as the stairways. Check any carpeting to make sure that it is firmly attached to the stairs. Fix any carpet that is loose or worn. Avoid having throw rugs at the top or bottom of the stairs. If you do have throw rugs, attach them to the floor with carpet tape. Make sure that you have a light switch at the top of the stairs and the bottom of the stairs. If you do not have them, ask someone to add them for you. What else can I do to help prevent falls? Wear shoes that: Do not have high heels. Have rubber bottoms. Are comfortable and fit you well. Are closed at the toe. Do not wear  sandals. If you use a stepladder: Make sure that it is fully opened. Do not climb a closed stepladder. Make sure that both sides of the stepladder are locked into place. Ask someone to hold it for you, if possible. Clearly mark and make sure that you can see: Any grab bars or handrails. First and last steps. Where the edge of each step is. Use tools that help you move around (mobility aids) if they are needed. These include: Canes. Walkers. Scooters. Crutches. Turn on the lights when you go into a dark area. Replace any light bulbs as soon as they burn out. Set up your furniture so you have a clear path. Avoid moving your furniture around. If any of your floors are uneven, fix them. If there are any pets around you, be aware of where they are. Review your medicines with your doctor. Some medicines can make you feel dizzy. This can increase your chance of falling. Ask your doctor what other things that you can do to help prevent falls. This information is not intended to replace advice given to you by your health care provider. Make sure you discuss any questions you have with your health care provider. Document Released: 06/25/2009 Document Revised: 02/04/2016 Document Reviewed: 10/03/2014 Elsevier Interactive Patient Education  2017 [employer].

## 2023-01-30 ENCOUNTER — Telehealth: Payer: Self-pay | Admitting: Neurology

## 2023-01-30 ENCOUNTER — Ambulatory Visit: Payer: Self-pay | Admitting: Licensed Clinical Social Worker

## 2023-01-30 NOTE — Telephone Encounter (Signed)
Patient would like to know if her pregablin 50mg  can be increased due to stabbing pain in her legs/feet. She doesn't think the medicine is working

## 2023-01-30 NOTE — Progress Notes (Signed)
I saw Sue [name] in neurology clinic on 02/03/23 in follow up for neuropathy.  HPI: Sue [name] is a 72 y.o. year old female with a history of DM, HFpEF, hypothyroidism, HTN, COPD, depression, anxiety, possible TIA in 2016, and former smoker who we last saw on 06/09/22.  To briefly review: Symptoms started about 11/2021. She has a burning and stinging pain in her feet and legs. She feels like something is squeezing her legs. She has the feeling of [place] in her legs. She feels that the left side is worse than the right. She reports that her left leg pain is near constant while the right seems more intermittent. The pain is mostly from the calf down, but occasional cramping in her thighs. She denies back pain, neck pain, and only rare headaches. She denies numbness or tingling in her hands or arms. Patient walks with a walker for the last 5 years (~2018). She has not had any recent falls.   Patient saw PCP (Dr. Zola Button) on 03/14/22 and mentioned stinging and burning in legs that was worse in the evening. She denied back pain. Patient was tried on gabapentin 100 mg that did not help. She did not feel good on the medication, so she stopped it. She took it about a week. She was referred to neurology for further evaluation.   Patient is on seroquel 200 mg at night to help sleep.   She takes vitamin D and vitamin B12 supplements. She is also taking a nerve supplement from [employer] Isla Pence) that she thinks is helping.    The patient denies symptoms suggestive of oculobulbar weakness including diplopia, ptosis, dysphagia, poor saliva control, dysarthria/dysphonia, impaired mastication, facial weakness/droop.   The patient does not report symptoms referable to autonomic dysfunction including impaired sweating, heat or cold intolerance, excessive mucosal dryness, gastroparetic early satiety, postprandial abdominal bloating, constipation, bowel or bladder dyscontrol, or  syncope/presyncope/orthostatic intolerance.   She does not report any constitutional symptoms like fever, night sweats, anorexia or unintentional weight loss.   EtOH use: None  Restrictive diet? No Family history of neuropathy/myopathy/NM disease? None   She had not had any physical therapy recently (as of 06/09/22).  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 There may also be some contributions from peripheral edema related pain. I will check an EMG for severity and labs to look for other treatable causes of neuropathy.   PLAN: -Blood work: MMA, IFE, B6 -EMG: LLE > RLE (PN protocol) -Alpha lipoic acid 600 mg once or twice a day -Lidocaine cream PRN -Cramp OTC recommendations given (see AVS)  Since their last visit: MMA and B6 were normal. IFE was indeterminate. EMG on 11/27/21 showed a possible mild sensory predominant neuropathy (vs age related changes). Patient was not getting adequate relief from gabapentin, so was switched to Lyrica 25 mg BID on 11/30/22.   Patient was to have a virtual visit on 12/14/22, but technical problems did not allow [place] to connect. Per phone conversation, patient was doing okay without acute issues, so follow up was rescheduled.  Patient called on 01/06/23 still having leg pain. Lyrica was increased to 50 mg BID on 01/06/23.   Patient is still having a lot of pain. She has pain in feet all the way into the chest. It is a stabbing pain or a bad itch. It can go from feet to thighs. It can wake her up  from her sleep. Most of the pain is in her feet and can go up to knee. She is having a lot of knee pain and has had to have injections previously. She has not had the injections in over a year. She has not talked to anyone about this. She is not moving around well currently. She does not feel she is eating well. She has lost about 50 pounds in the last  month or so, which she attributes to not eating. She denies fever or night sweats.  Patient usually has edema in her legs, but now does not have edema and has a lot of pain.    MEDICATIONS:  Outpatient Encounter Medications as of 02/03/2023  Medication Sig   Accu-Chek Softclix Lancets lancets USE 1  TO CHECK GLUCOSE 4 TIMES DAILY   albuterol (VENTOLIN HFA) 108 (90 Base) MCG/ACT inhaler Inhale 1-2 puffs into the lungs every 6 (six) hours as needed for wheezing or shortness of breath.   allopurinol (ZYLOPRIM) 100 MG tablet Take 100 mg by mouth daily as needed (for gout flareups).   Blood Glucose Monitoring Suppl (ACCU-CHEK GUIDE) w/Device KIT USE   TO CHECK GLUCOSE UP TO 4 TIMES DAILY AS DIRECTED   busPIRone (BUSPAR) 15 MG tablet TAKE 1 TABLET BY MOUTH TWICE A DAY   Dulaglutide (TRULICITY) 3 MG/0.5ML SOPN Inject 3 mg as directed once a week.   famotidine (PEPCID) 20 MG tablet Take 20 mg by mouth daily as needed for heartburn or indigestion.   glucose blood (ACCU-CHEK GUIDE) test strip USE 1 STRIP TO CHECK GLUCOSE 4 TIMES DAILY   ibuprofen (ADVIL) 800 MG tablet Take 800 mg by mouth every 8 (eight) hours as needed for headache.   insulin glargine, 1 Unit Dial, (TOUJEO SOLOSTAR) 300 UNIT/ML Solostar Pen Inject 64 Units into the skin daily in the afternoon.   Insulin Pen Needle 29G X MISC 1 Device by Does not apply route daily in the afternoon.   levothyroxine (SYNTHROID) 88 MCG tablet Take 1 tablet (88 mcg total) by mouth daily.   LORazepam (ATIVAN) 1 MG tablet TAKE 1/2 TO 1 AND 1/2 TABLETS AT BEDTIME AND AN ADDITIONAL 1/2 TAB ONCE A DAY AS NEEDED FOR AXIETY   Magnesium 400 MG CAPS Take 1 capsule by mouth daily.   metoprolol succinate (TOPROL-XL) 50 MG 24 hr tablet Take 2 tablets (100 mg total) by mouth daily. Take with or immediately following a meal   OXYGEN Inhale 3-4 L/min into the lungs continuous.    potassium chloride SA (KLOR-CON M) 20 MEQ tablet Take 1 tablet by mouth once daily    pregabalin (LYRICA) 50 MG capsule Take 1 capsule (50 mg total) by mouth 2 (two) times daily. (Patient taking differently: Take 50 mg by mouth 3 (three) times daily.)   QUEtiapine (SEROQUEL) 200 MG tablet Take 1 tablet (200 mg total) by mouth at bedtime.   rosuvastatin (CRESTOR) 10 MG tablet Take 1 tablet (10 mg total) by mouth daily. For heart health and to lower cholesterol.   torsemide (DEMADEX) 20 MG tablet Take 3 tablets by mouth once daily   zolpidem (AMBIEN CR) 12.5 MG CR tablet TAKE 1 TABLET BY MOUTH AT BEDTIME AS NEEDED FOR SLEEP   No facility-administered encounter medications on file as of 02/03/2023.    PAST MEDICAL HISTORY: Past Medical History:  Diagnosis Date   Allergic rhinitis    Depression    Diabetes mellitus type 2, uncontrolled    Emphysema of lung (HCC)  3L home O2   GERD (gastroesophageal reflux disease)    Hypertension    Hypothyroidism    Obesity, morbid, BMI 50 or higher (HCC)    Stroke (HCC) 2016   TIA    Urine incontinence     PAST SURGICAL HISTORY: Past Surgical History:  Procedure Laterality Date   ABDOMINAL HYSTERECTOMY     CESAREAN SECTION     COLONOSCOPY N/A 08/19/2019   Procedure: COLONOSCOPY;  Surgeon: Vida Rigger, MD;  Location: [facility];  Service: Endoscopy;  Laterality: N/A;   COLONOSCOPY WITH PROPOFOL N/A 08/05/2019   Procedure: COLONOSCOPY WITH PROPOFOL;  Surgeon: Charlott Rakes, MD;  Location: [facility];  Service: Gastroenterology;  Laterality: N/A;   COLONOSCOPY WITH PROPOFOL N/A 11/19/2019   Procedure: COLONOSCOPY WITH PROPOFOL;  Surgeon: Bernette Redbird, MD;  Location: [facility];  Service: Endoscopy;  Laterality: N/A;  Unprepped   COLONOSCOPY WITH PROPOFOL N/A 11/22/2019   Procedure: COLONOSCOPY WITH PROPOFOL;  Surgeon: Bernette Redbird, MD;  [facility]: [facility];  Service: Endoscopy;  Laterality: N/A;   COLONOSCOPY WITH PROPOFOL N/A 11/23/2019   Procedure: COLONOSCOPY WITH PROPOFOL;  Surgeon: Kerin Salen, MD;  [facility];  Service: Gastroenterology;  Laterality: N/A;   COLONOSCOPY WITH PROPOFOL N/A 11/29/2019   Procedure: COLONOSCOPY WITH PROPOFOL;  Surgeon: Charlott Rakes, MD;  [facility];  Service: Endoscopy;  Laterality: N/A;   ENTEROSCOPY N/A 11/24/2019   Procedure: ENTEROSCOPY;  Surgeon: Kerin Salen, MD;  [facility];  Service: Gastroenterology;  Laterality: N/A;   ENTEROSCOPY N/A 11/27/2019   Procedure: ENTEROSCOPY;  Surgeon: Charlott Rakes, MD;  [facility];  Service: Endoscopy;  Laterality: N/A;   ESOPHAGOGASTRODUODENOSCOPY N/A 11/27/2019   Procedure: ESOPHAGOGASTRODUODENOSCOPY (EGD);  Surgeon: Charlott Rakes, MD;  Location: [facility];  Service: Endoscopy;  Laterality: N/A;   ESOPHAGOGASTRODUODENOSCOPY (EGD) WITH PROPOFOL N/A 11/24/2019   Procedure: ESOPHAGOGASTRODUODENOSCOPY (EGD) WITH PROPOFOL;  Surgeon: Kerin Salen, MD;  [facility];  Service: Gastroenterology;  Laterality: N/A;  PUSH enteroscopy   GIVENS CAPSULE STUDY N/A 11/19/2019   Procedure: GIVENS CAPSULE STUDY;  Surgeon: Bernette Redbird, MD;  [facility];  Service: Endoscopy;  Laterality: N/A;  To be performed immediately following colonoscopy   GIVENS CAPSULE STUDY N/A 11/24/2019   Procedure: GIVENS CAPSULE STUDY;  Surgeon: Kerin Salen, MD;  [facility];  Service: Gastroenterology;  Laterality: N/A;   GIVENS CAPSULE STUDY N/A 11/28/2019   Procedure: GIVENS CAPSULE STUDY;  Surgeon: Charlott Rakes, MD;  [facility];  Service: Endoscopy;  Laterality: N/A;   HEMOSTASIS CLIP PLACEMENT  11/19/2019   Procedure: HEMOSTASIS CLIP PLACEMENT;  Surgeon: Bernette Redbird, MD;  [facility];  Service: Endoscopy;;   HEMOSTASIS CLIP PLACEMENT  11/22/2019   Procedure: HEMOSTASIS CLIP PLACEMENT;  Surgeon: Bernette Redbird, MD;  [facility];  Service: Endoscopy;;   HOT HEMOSTASIS N/A 11/24/2019   Procedure: HOT HEMOSTASIS (ARGON PLASMA COAGULATION/BICAP);   Surgeon: Kerin Salen, MD;  Location: [facility];  Service: Gastroenterology;  Laterality: N/A;   HOT HEMOSTASIS N/A 11/27/2019   Procedure: HOT HEMOSTASIS (ARGON PLASMA COAGULATION/BICAP);  Surgeon: Charlott Rakes, MD;  Location: [facility];  Service: Endoscopy;  Laterality: N/A;   SUBMUCOSAL TATTOO INJECTION  11/19/2019   Procedure: SUBMUCOSAL TATTOO INJECTION;  Surgeon: Bernette Redbird, MD;  [facility];  Service: Endoscopy;;    ALLERGIES: Allergies  Allergen Reactions   Hydrocodone Other (See Comments)    Constipation and hallucinations   Norvasc [Amlodipine Besylate] Swelling and Other (See Comments)    Marked swelling  of the limbs   Tizanidine Other (See Comments)    After the 3rd dose, the patient's mouth began to feel numb and she felt like she was a having a "hot flash"    FAMILY HISTORY: Family History  Problem Relation Age of Onset   Heart disease Father        MVP and Pics Valve   Hypertension Father    Depression Father        Institutionalized x's 2 years   Bipolar disorder Father    Hypertension Sister    Diabetes Sister    Hyperlipidemia Sister    Heart disease Sister 59       MI   Heart disease Brother    Hypertension Brother    Schizophrenia Paternal Aunt    Depression Paternal Aunt    Anxiety disorder Paternal Aunt    Heart disease Paternal Aunt    Schizophrenia Paternal Aunt    Heart disease Paternal Uncle    Heart disease Paternal Grandmother    Asthma Son    Asthma Son     SOCIAL HISTORY: Social History   Tobacco Use   Smoking status: Former    Packs/day: 1.00    Years: 40.00    Additional pack years: 0.00    Total pack years: 40.00    Types: Cigarettes    Start date: 07/21/1972    Quit date: 10/16/2011    Years since quitting: 11.3   Smokeless tobacco: Never  Vaping Use   Vaping Use: Never used  Substance Use Topics   Alcohol use: Not Currently    Comment: Occ-- Wine   Drug use: No   Social History   Social History  Narrative      Originally from [place]. Has also lived in [place], [place]. She also previously lived in [place] for 20 years. No history of [place] Fever. Moved to [place] in 1989. 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 No pets currently. No bird exposure. Questionable previous mold exposure in her daughter's home. 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       Lives in [place] alone in assisted living.  Has multiple chronic illness.      Are you right handed or left handed? Right handed   Are you currently employed ? no   What is your current occupation? retired   Do you live at home alone?no   Who lives with you? son   What type of home do you live in: 1 story or 2 story? Apartment first floor        Objective:  Vital Signs:  BP (!) 164/77   Pulse 91   Resp 18   Ht 5\' 3"  (1.6 m)   Wt 252 lb (114.3 kg)   SpO2 94%   BMI 44.64 kg/m   General: General appearance: Awake and alert. No distress. Cooperative with exam.  Skin: No obvious rash or jaundice. HEENT: Atraumatic. Anicteric. Lungs: Conversational dyspnea, on home O2 via [place] Extremities: Mild peripheral edema in bilateral lower extremities.  Neurological: Mental Status: Alert. Speech fluent. No pseudobulbar affect Cranial Nerves: CNII: No RAPD. Visual fields intact. CNIII, IV, VI: PERRL. No nystagmus. EOMI. CN V: Facial sensation intact bilaterally to fine touch. CN VII: Facial muscles symmetric and  strong. No ptosis at rest. CN VIII: Hears finger rub well bilaterally. CN IX: No hypophonia. CN X: Palate elevates symmetrically. CN XI: Full strength shoulder shrug bilaterally. CN XII: Tongue protrusion full and midline. No atrophy or fasciculations. No significant dysarthria Motor: Tone is  normal.  Individual muscle group testing (MRC grade out of 5):  Movement     Neck flexion 5    Neck extension 5     Right Left   Shoulder abduction 5 5   Elbow flexion 5 5   Elbow extension 5 5   Finger extension 5 5   Finger flexion 5 5    Hip flexion 5 5   Knee extension 5 5   Knee flexion 5 5   Dorsiflexion 5 5   Plantarflexion 5 5    Reflexes:  Right Left  Bicep 1+ 1+  Tricep 1+ 1+  BrRad 1+ 1+  Knee Trace Trace  Ankle 0 0   Sensation: Pinprick: Intact in bilateral upper extremities. Diminished in bilateral lower extremities to the thighs Vibration: Intact in bilateral upper extremities, ~5 seconds in bilateral great toes Temperature: Diminished in left foot and leg, otherwise intact Proprioception: Intact in bilateral great toes. Coordination: Intact finger-to- nose-finger bilaterally. Romberg with mild sway. Gait: Unable to rise from chair with arms crossed unassisted. Wide-based, unsteady gait.    Lab and Test Review: New results: 06/09/22: IFE: poorly defined area of restricted protein mobility (reactive with IgG and kappa) B6 wnl MMA wnl  TSH (09/01/22): 5.455 BMP (10/28/22): significant for K 3.0, glucose 382, Cr 1.95 CMP (12/23/22): K of 2.9, glucose 431, Cr 1.76, AST 47, ALT 23 CBC (10/28/22): unremarkable  EMG (11/28/22): NCV & EMG Findings: Extensive electrodiagnostic evaluation of the left lower limb with additional nerve conduction studies of the left upper limb shows: Left sural and superficial peroneal/fibular sensory responses are absent. Left median, ulnar, and radial sensory responses are within normal limits. Left peroneal/fibular (EDB), tibial (AH), median (APB), and ulnar (ADM) motor responses are within normal limits. Left H reflex is absent. There is no evidence of active or chronic motor axon loss changes affecting any of the tested muscles on needle examination. Motor unit configuration and recruitment pattern is within normal limits.    Impression: The findings above are most consistent with the following: Absent lower limb sensory responses and H reflex may be normal at this age or represent evidence of a large fiber sensory predominant neuropathy. No electrodiagnostic evidence of a left lumbosacral (L3-S1) motor radiculopathy. No electrodiagnostic evidence of a left median mononeuropathy at or distal to the wrist (ie: carpal tunnel syndrome). Screening studies for left ulnar and radial mononeuropathies are normal.  CT head wo contrast (for mental status change in setting of hyperglycemia) (10/28/22): FINDINGS: Brain: No acute territorial infarction, hemorrhage or intracranial mass. Mild atrophy. Nonenlarged ventricles.   Vascular: No hyperdense vessels.  Carotid vascular calcification.   Skull: Normal. Negative for fracture or focal lesion.   Sinuses/Orbits: Old fracture deformity medial wall left orbit. Moderate mucosal thickening in the right maxillary sinus with scattered calcific deposits.   Other: None.   IMPRESSION: No CT evidence for acute intracranial abnormality. Mild atrophy.  Previously reviewed results: Normal or unremarkable:  03/14/22: TSH: 15.21 HbA1c: 7.7 CMP significant for elevated glucose (201) and Cr (1.38) CBC significant for elevated MCV (106.3) B12 (01/07/20): 448   MRI brain wo contrast (01/01/20): CLINICAL DATA:  TIA. Transient left facial numbness and slurred speech.   EXAM: MRI HEAD  WITHOUT CONTRAST   TECHNIQUE: Multiplanar, multiecho pulse sequences of the brain and surrounding structures were obtained without intravenous contrast.   COMPARISON:  Head CT 01/01/2020 and MRI 07/30/2014   FINDINGS: Brain: There is no evidence of acute infarct, intracranial hemorrhage, mass, midline shift, or extra-axial fluid collection. The ventricles and sulci are normal. T2 hyperintensities in the cerebral white matter bilaterally are unchanged from the prior MRI and nonspecific but  compatible with minimal chronic small vessel ischemic disease.   Vascular: Major intracranial vascular flow voids are preserved.   Skull and upper cervical spine: Unremarkable bone marrow signal.   Sinuses/Orbits: Remote medial left orbital fracture. Chronic right sphenoid sinusitis. Clear mastoid air cells.   Other: None.   IMPRESSION: 1. No acute intracranial abnormality. 2. Minimal chronic small vessel ischemic disease.  ASSESSMENT: This is Sue [name], a 72 y.o. female with pain in bilateral lower extremities. 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 She has previously failed gabapentin and continues to have increase in pain despite increasing doses of Lyrica. She does get some relief, but it does not last long enough. I will increase the frequency to TID to see if this helps. Her increase in pain also raises concern for an etiology that has not been identified. She does have DM, but perhaps this has been recently worsening. Her last A1c was 03/2022, so I will repeat today. I will also get other labs to look for other treatable causes that we may be missing. Finally, she describes significant knee pain, so perhaps there is an overlapping MSK component as well. She will follow up with her orthopedist to evaluate this.  Plan: -Blood work: B1, Repeat IFE, HbA1c -Increase Lyrica to 50 mg TID -Lidocaine cream PRN -Patient to see orthopaedist for knee pain  Return to clinic in 3 months  Total time spent reviewing records, interview, history/exam, documentation, and coordination of care on day of encounter:  50 min  Jacquelyne Balint, MD

## 2023-01-30 NOTE — Telephone Encounter (Signed)
Unable to leave message. VM either full or unavailable. Will try again later.   

## 2023-01-30 NOTE — Telephone Encounter (Signed)
Pt called back in and left a message. Wanting to talk with someone about her pain.

## 2023-01-30 NOTE — Patient Outreach (Signed)
  Care Coordination   Follow Up Visit Note   01/30/2023 Name: CHAMIRA GOEGLEIN MRN: [id number] DOB: 1950/09/14  SAFIYA TALMADGE is a 72 y.o. year old female who sees Zola Button, [place], DO for primary care. I spoke with  [name] [name], son of client, by phone today.  What matters to the patients health and wellness today?  Patient is interested in program support. She would like to   learn more about nursing support and SW support    Goals Addressed             This Visit's Progress    Patient is interested in program support. Would like to learn more about nursing support and SW support       Interventions  Spoke via phone today with [name], son of client, about current client needs Discussed client ambulation. Client uses walker to help her walk Discussed client use of oxygen. [name] said client uses oxygen daily as prescribed Discussed medication procurement Discussed family support for client. Client has support of her daughter, [name]; client has support of her son, [name] Discussed program support for client with RN, LCSW and Pharmacist Discussed client recovery from eye procedure. Shawn said client is now not having to wear glasses Client has home health aide who assists her weekly in the home as scheduled Encouraged Shawn or [name] to call LCSW as needed for SW support for client at [phone]         SDOH assessments and interventions completed:  Yes  SDOH Interventions Today    Flowsheet Row Most Recent Value  SDOH Interventions   Depression Interventions/Treatment  Medication  Physical Activity Interventions Other (Comments)  [uses walker to help her walk]  Stress Interventions Other (Comment)  [stress in managing medical needs]        Care Coordination Interventions:  Yes, provided   Interventions Today    Flowsheet Row Most Recent Value  Chronic Disease   Chronic disease during today's  visit Other  [spoke with [name] , son of client, about client needs]  General Interventions   General Interventions Discussed/Reviewed General Interventions Discussed, [facility]  [discussed program support]  Exercise Interventions   Exercise Discussed/Reviewed Physical Activity  [uses walker to help her walk]  Physical Activity Discussed/Reviewed Physical Activity Discussed  Education Interventions   Education Provided Provided Education  Provided Verbal Education On Community Resources  Mental Health Interventions   Mental Health Discussed/Reviewed Coping Strategies  [client has support from her children. Client uses oxygen daily as prescrbed. she may need occasional help with ADLs]  Nutrition Interventions   Nutrition Discussed/Reviewed Nutrition Discussed  Pharmacy Interventions   Pharmacy Dicussed/Reviewed Pharmacy Topics Discussed        Follow up plan: Follow up call scheduled for 03/21/23 at 2:00 PM     Encounter Outcome:  Pt. Visit Completed   Kelton Pillar.Idrees Quam MSW, LCSW Licensed Visual merchandiser [facility] Management [phone]

## 2023-01-30 NOTE — Patient Instructions (Signed)
Visit Information  Thank you for taking time to visit with me today. Please don't hesitate to contact me if I can be of assistance to you.   Following are the goals we discussed today:   Goals Addressed             This Visit's Progress    Patient is interested in program support. Would like to learn more about nursing support and SW support       Interventions  Spoke via phone today with [name], son of client, about current client needs Discussed client ambulation. Client uses walker to help her walk Discussed client use of oxygen. [name] said client uses oxygen daily as prescribed Discussed medication procurement Discussed family support for client. Client has support of her daughter, [name]; client has support of her son, [name] Discussed program support for client with RN, LCSW and Pharmacist Discussed client recovery from eye procedure. Shawn said client is now not having to wear glasses Client has home health aide who assists her weekly in the home as scheduled Encouraged Shawn or [name] to call LCSW as needed for SW support for client at [phone]         Our next appointment is by telephone on 03/21/23 at 2:00 PM   Please call the care guide team at [phone] if you need to cancel or reschedule your appointment.   If you are experiencing a Mental Health or Behavioral Health Crisis or need someone to talk to, please go to [facility] [address] [place] [phone])   The patient / [name] , son, verbalized understanding of instructions, educational materials, and care plan provided today and DECLINED offer to receive copy of patient instructions, educational materials, and care plan.   The patient [name], son,  has been provided with contact information for the care management team and has been advised to call with any health related questions or concerns.    Kelton Pillar.Philemon Riedesel MSW, LCSW Licensed Visual merchandiser [facility] Care Management [phone]

## 2023-01-30 NOTE — Telephone Encounter (Signed)
Called and informed pt. Of Dr. Jorge Ny answers and she still wanted to see if there was anything else to take. I asked Dr Loleta Chance and he repeated the same answer that he would need to see her. She had an appointment last week and no showed.

## 2023-02-03 ENCOUNTER — Other Ambulatory Visit (INDEPENDENT_AMBULATORY_CARE_PROVIDER_SITE_OTHER): Payer: Medicare HMO

## 2023-02-03 ENCOUNTER — Encounter: Payer: Self-pay | Admitting: Neurology

## 2023-02-03 ENCOUNTER — Ambulatory Visit (INDEPENDENT_AMBULATORY_CARE_PROVIDER_SITE_OTHER): Payer: Medicare HMO | Admitting: Neurology

## 2023-02-03 VITALS — BP 130/64 | HR 91 | Resp 18 | Ht 63.0 in | Wt 252.0 lb

## 2023-02-03 DIAGNOSIS — E1142 Type 2 diabetes mellitus with diabetic polyneuropathy: Secondary | ICD-10-CM

## 2023-02-03 DIAGNOSIS — R209 Unspecified disturbances of skin sensation: Secondary | ICD-10-CM

## 2023-02-03 DIAGNOSIS — M25561 Pain in right knee: Secondary | ICD-10-CM

## 2023-02-03 DIAGNOSIS — M25562 Pain in left knee: Secondary | ICD-10-CM

## 2023-02-03 DIAGNOSIS — R269 Unspecified abnormalities of gait and mobility: Secondary | ICD-10-CM

## 2023-02-03 DIAGNOSIS — G8929 Other chronic pain: Secondary | ICD-10-CM | POA: Diagnosis not present

## 2023-02-03 LAB — HEMOGLOBIN A1C: Hgb A1c MFr Bld: 9.9 % — ABNORMAL HIGH (ref 4.6–6.5)

## 2023-02-03 MED ORDER — PREGABALIN 50 MG PO CAPS
50.0000 mg | ORAL_CAPSULE | Freq: Three times a day (TID) | ORAL | 5 refills | Status: DC
Start: 2023-02-03 — End: 2023-05-05

## 2023-02-03 NOTE — Telephone Encounter (Signed)
Appointment today with Dr Loleta Chance. Discussed at visit.

## 2023-02-03 NOTE — Patient Instructions (Addendum)
We will increase your Lyrica 50 mg three times per day. I sent a new prescription to your pharmacy.  You can also try Lidocaine cream as needed. Apply wear you have pain, tingling, or burning. Wear gloves to prevent your hands being numb. This can be bought over the counter at any drug store or online.  Alpha lipoic acid 600mg  daily has some research data suggesting it helps with nerve health. No major side effects other than <1% of people report upset stomach. This can be taken twice per day (1200mg  daily) if no relief obtained. You can buy this over the counter or online.  I want you to see orthopedic doctor to evaluate your knee pain.  The physicians and staff at [facility] are committed to providing excellent care. You may receive a survey requesting feedback about your experience at our office. We strive to receive "very good" responses to the survey questions. If you feel that your experience would prevent you from giving the office a "very good " response, please contact our office to try to remedy the situation. We may be reached at [phone]. Thank you for taking the time out of your busy day to complete the survey.  Jacquelyne Balint, MD [facility]

## 2023-02-07 ENCOUNTER — Other Ambulatory Visit: Payer: Self-pay | Admitting: Behavioral Health

## 2023-02-07 DIAGNOSIS — F3131 Bipolar disorder, current episode depressed, mild: Secondary | ICD-10-CM

## 2023-02-07 DIAGNOSIS — F331 Major depressive disorder, recurrent, moderate: Secondary | ICD-10-CM

## 2023-02-09 LAB — IMMUNOFIXATION ELECTROPHORESIS
IgG (Immunoglobin G), Serum: 2717 mg/dL — ABNORMAL HIGH (ref 600–1540)
IgM, Serum: 319 mg/dL — ABNORMAL HIGH (ref 50–300)
Immunoglobulin A: 688 mg/dL — ABNORMAL HIGH (ref 70–320)

## 2023-02-09 LAB — VITAMIN B1: Vitamin B1 (Thiamine): 14 nmol/L (ref 8–30)

## 2023-02-13 ENCOUNTER — Telehealth: Payer: Self-pay

## 2023-02-13 ENCOUNTER — Other Ambulatory Visit: Payer: Self-pay | Admitting: Behavioral Health

## 2023-02-13 DIAGNOSIS — F5105 Insomnia due to other mental disorder: Secondary | ICD-10-CM

## 2023-02-13 MED ORDER — BELSOMRA 15 MG PO TABS
15.0000 mg | ORAL_TABLET | Freq: Every day | ORAL | 1 refills | Status: DC
Start: 2023-02-13 — End: 2023-12-31

## 2023-02-13 NOTE — Telephone Encounter (Signed)
Update for PA: Resubmitted her prior authorization for ZOLPIDEM CR 12.5 MG with [facility] Medicare but it was denied within the last 60 days. [employer] medicare requires she try Belsomra before they will approve a non formulary. Unless a letter can be written for medical necessity on why she can not take Belsomra first.  Pt paid out of pocket and filled her Rx of Zolpidem CR 12.5 mg on 02/12/2023 #20. Next apt 04/24/23

## 2023-02-14 ENCOUNTER — Telehealth: Payer: Self-pay

## 2023-02-14 NOTE — Telephone Encounter (Signed)
Patient called.  Patient aware. Pt called an informed that Your labs showed that your sugar number (HbA1c) is much higher than previously (now 9.9, previously 7.7). This means your diabetes is not as well controlled as before. This is likely why your neuropathy has worsened and more difficulty to treat. I recommend you touch base with your primary care doctor as getting better control of your diabetes will also hopefully help with neuropathy not worsening. Pt is going to call her PCP she said with the increase of gabapentin she not having any pain

## 2023-02-16 NOTE — Telephone Encounter (Signed)
Tried reaching pt a couple of times so far but no answer and her VM is not set up.

## 2023-02-28 ENCOUNTER — Ambulatory Visit: Payer: Self-pay | Admitting: Licensed Clinical Social Worker

## 2023-02-28 NOTE — Patient Outreach (Signed)
Care Coordination   Follow Up Visit Note   02/28/2023 Name: Sue [name] MRN: [id number] DOB: [birth year]  Sue [name] is a 72 y.o. year old female who sees Zola Button, Grayling Congress, DO for primary care. I spoke with  Sue [name] / Sue [name], daughter of client via phone today.  What matters to the patients health and wellness today?    Patient has level of care issues. Sue [name], daughter of patient, plans to talk with PCP this week at appointment regarding level of care issues of client      Goals Addressed             This Visit's Progress    Patient has level of care issues. Sue [name], daughter of patient, plans to talk with PCP this week at appointment regarding level of care issues of client       Interventions Spoke with Sue [name], daughter of client, about client needs Discussed program support for client Discussed Advanced Care Documents for client. Discussed HCPOA document for client with Sue [name]. Discussed Durable Power of Attorney document completion for client with Sue [name] Discussed walking of client. Sue [name] said client is at risk for falls.  Client has appointment this week with PCP. Sue [name] said she plans to talk with PCP this week about level of care concerns for client. Client currently has care giver for 2 hours daily who assists client in the home Sue [name] said client is now needing more care than this daily. Discussed sleeping issues of client. Discussed medication procurement of client.  Discussed insurance coverage of client Discussed PCS application process  with Sue [name] Discussed memory challenges of client Encouraged Sue [name] to call LCSW as needed for SW support for client at [phone]        SDOH assessments and interventions completed:  Yes  SDOH Interventions Today    Flowsheet Row Most Recent Value  SDOH Interventions   Depression Interventions/Treatment  Counseling  [LCSW spoke with Sue [name], daughter of client , about client needs]  Physical Activity Interventions Other (Comments)  [challenges in walking]  Stress Interventions Other (Comment)  [fall risk,  stress related to managing medical needs]        Care Coordination Interventions:  Yes, provided   Interventions Today    Flowsheet Row Most Recent Value  Chronic Disease   Chronic disease during today's visit Other  [spoke with Sue [name], daughter of client, about client needs]  General Interventions   General Interventions Discussed/Reviewed General Interventions Discussed, Community Resources  [discussed program support]  Exercise Interventions   Exercise Discussed/Reviewed Physical Activity  [fall risk]  Physical Activity Discussed/Reviewed Physical Activity Discussed  Education Interventions   Education Provided Provided Education  Provided Verbal Education On [employer]  [discussed home health care assistance. Discussed PCS services possibly for client. discussed HCPOA form completion. Discussed Durable Power of Attorney document]  Mental Health Interventions   Mental Health Discussed/Reviewed Anxiety  [client has anxiety issues. client has memory challenges]  Nutrition Interventions   Nutrition Discussed/Reviewed Nutrition Discussed  Pharmacy Interventions   Pharmacy Dicussed/Reviewed Pharmacy Topics Discussed  Safety Interventions   Safety Discussed/Reviewed Fall Risk         Follow up plan: LCSW to call client or Sue [name] , daughter of client, as scheduled to assess client needs at that time   Encounter Outcome:  Pt. Visit Completed   Kelton Pillar.Varina Hulon MSW, LCSW Licensed Visual merchandiser [facility] Care Management [phone]

## 2023-02-28 NOTE — Patient Instructions (Signed)
Visit Information  Thank you for taking time to visit with me today. Please don't hesitate to contact me if I can be of assistance to you.   Following are the goals we discussed today:   Goals Addressed             This Visit's Progress    Patient has level of care issues. [name], daughter of patient, plans to talk with PCP this week at appointment regarding level of care issues of client       Interventions Spoke with [name], daughter of client, about client needs Discussed program support for client Discussed Advanced Care Documents for client. Discussed HCPOA document for client with [name]. Discussed Durable Power of Attorney document completion for client with [name] Discussed walking of client. [name] said client is at risk for falls.  Client has appointment this week with PCP. [name] said she plans to talk with PCP this week about level of care concerns for client. Client currently has care giver for 2 hours daily who assists client in the home [name] said client is now needing more care than this daily. Discussed sleeping issues of client. Discussed medication procurement of client.  Discussed insurance coverage of client Discussed PCS application process  with Reed Breech Discussed memory challenges of client Encouraged [name] to call LCSW as needed for SW support for client at [phone]       LCSW to call client or [name], daughter of client as scheduled to assess client needs at that time  Please call the care guide team at [phone] if you need to cancel or reschedule your appointment.   If you are experiencing a Mental Health or Behavioral Health Crisis or need someone to talk to, please go to [facility] [address] [place] [phone])   The patient/ [name], daughter,  verbalized understanding of instructions, educational materials, and care plan provided  today and DECLINED offer to receive copy of patient instructions, educational materials, and care plan.   The patient / [name], daughter, as been provided with contact information for the care management team and has been advised to call with any health related questions or concerns.   Kelton Pillar.Chrisie Jankovich MSW, LCSW Licensed Visual merchandiser [facility] Care Management [phone]

## 2023-03-03 ENCOUNTER — Ambulatory Visit (HOSPITAL_BASED_OUTPATIENT_CLINIC_OR_DEPARTMENT_OTHER)
Admission: RE | Admit: 2023-03-03 | Discharge: 2023-03-03 | Disposition: A | Discharge status: Home / Self Care | Payer: Medicare HMO | Source: Ambulatory Visit | Attending: Family Medicine | Admitting: Family Medicine

## 2023-03-03 ENCOUNTER — Ambulatory Visit (INDEPENDENT_AMBULATORY_CARE_PROVIDER_SITE_OTHER): Payer: Medicare HMO | Admitting: Family Medicine

## 2023-03-03 ENCOUNTER — Encounter: Payer: Self-pay | Admitting: Family Medicine

## 2023-03-03 VITALS — BP 140/90 | HR 82 | Temp 98.2°F | Resp 22 | Ht 63.0 in | Wt 277.6 lb

## 2023-03-03 DIAGNOSIS — E876 Hypokalemia: Secondary | ICD-10-CM | POA: Diagnosis not present

## 2023-03-03 DIAGNOSIS — M79605 Pain in left leg: Secondary | ICD-10-CM

## 2023-03-03 DIAGNOSIS — Z794 Long term (current) use of insulin: Secondary | ICD-10-CM

## 2023-03-03 DIAGNOSIS — M7989 Other specified soft tissue disorders: Secondary | ICD-10-CM | POA: Diagnosis not present

## 2023-03-03 DIAGNOSIS — E039 Hypothyroidism, unspecified: Secondary | ICD-10-CM

## 2023-03-03 DIAGNOSIS — R600 Localized edema: Secondary | ICD-10-CM

## 2023-03-03 DIAGNOSIS — N1832 Chronic kidney disease, stage 3b: Secondary | ICD-10-CM

## 2023-03-03 DIAGNOSIS — J9611 Chronic respiratory failure with hypoxia: Secondary | ICD-10-CM

## 2023-03-03 DIAGNOSIS — I5032 Chronic diastolic (congestive) heart failure: Secondary | ICD-10-CM

## 2023-03-03 DIAGNOSIS — E1169 Type 2 diabetes mellitus with other specified complication: Secondary | ICD-10-CM | POA: Diagnosis not present

## 2023-03-03 DIAGNOSIS — E1142 Type 2 diabetes mellitus with diabetic polyneuropathy: Secondary | ICD-10-CM

## 2023-03-03 DIAGNOSIS — M25562 Pain in left knee: Secondary | ICD-10-CM | POA: Diagnosis not present

## 2023-03-03 DIAGNOSIS — E1165 Type 2 diabetes mellitus with hyperglycemia: Secondary | ICD-10-CM | POA: Diagnosis not present

## 2023-03-03 DIAGNOSIS — E785 Hyperlipidemia, unspecified: Secondary | ICD-10-CM

## 2023-03-03 DIAGNOSIS — G629 Polyneuropathy, unspecified: Secondary | ICD-10-CM

## 2023-03-03 LAB — COMPREHENSIVE METABOLIC PANEL
ALT: 12 U/L (ref 0–35)
AST: 22 U/L (ref 0–37)
Albumin: 3.3 g/dL — ABNORMAL LOW (ref 3.5–5.2)
Alkaline Phosphatase: 111 U/L (ref 39–117)
BUN: 13 mg/dL (ref 6–23)
CO2: 31 mEq/L (ref 19–32)
Calcium: 8.3 mg/dL — ABNORMAL LOW (ref 8.4–10.5)
Chloride: 99 mEq/L (ref 96–112)
Creatinine, Ser: 1.63 mg/dL — ABNORMAL HIGH (ref 0.40–1.20)
GFR: 31.73 mL/min — ABNORMAL LOW (ref >60.00)
Glucose, Bld: 120 mg/dL — ABNORMAL HIGH (ref 70–99)
Potassium: 3.3 mEq/L — ABNORMAL LOW (ref 3.5–5.1)
Sodium: 139 mEq/L (ref 135–145)
Total Bilirubin: 0.6 mg/dL (ref 0.2–1.2)
Total Protein: 8 g/dL (ref 6.0–8.3)

## 2023-03-03 LAB — LIPID PANEL
Cholesterol: 130 mg/dL (ref 0–200)
HDL: 35 mg/dL — ABNORMAL LOW (ref >39.00)
LDL Cholesterol: 78 mg/dL (ref 0–99)
NonHDL: 95.18
Total CHOL/HDL Ratio: 4
Triglycerides: 84 mg/dL (ref 0.0–149.0)
VLDL: 16.8 mg/dL (ref 0.0–40.0)

## 2023-03-03 LAB — CBC WITH DIFFERENTIAL/PLATELET
Basophils Absolute: 0 10*3/uL (ref 0.0–0.1)
Basophils Relative: 0.5 % (ref 0.0–3.0)
Eosinophils Absolute: 0.2 10*3/uL (ref 0.0–0.7)
Eosinophils Relative: 3.4 % (ref 0.0–5.0)
HCT: 36.5 % (ref 36.0–46.0)
Hemoglobin: 11.9 g/dL — ABNORMAL LOW (ref 12.0–15.0)
Lymphocytes Relative: 40.1 % (ref 12.0–46.0)
Lymphs Abs: 2.5 10*3/uL (ref 0.7–4.0)
MCHC: 32.5 g/dL (ref 30.0–36.0)
MCV: 96.8 fl (ref 78.0–100.0)
Monocytes Absolute: 0.5 10*3/uL (ref 0.1–1.0)
Monocytes Relative: 8.2 % (ref 3.0–12.0)
Neutro Abs: 3 10*3/uL (ref 1.4–7.7)
Neutrophils Relative %: 47.8 % (ref 43.0–77.0)
Platelets: 178 10*3/uL (ref 150.0–400.0)
RBC: 3.77 Mil/uL — ABNORMAL LOW (ref 3.87–5.11)
RDW: 13.1 % (ref 11.5–15.5)
WBC: 6.2 10*3/uL (ref 4.0–10.5)

## 2023-03-03 LAB — TSH: TSH: 5.94 u[IU]/mL — ABNORMAL HIGH (ref 0.35–5.50)

## 2023-03-03 MED ORDER — DICLOFENAC SODIUM 1 % EX GEL
4.0000 g | Freq: Four times a day (QID) | CUTANEOUS | 2 refills | Status: DC
Start: 2023-03-03 — End: 2023-12-31

## 2023-03-03 NOTE — Assessment & Plan Note (Signed)
Encourage heart healthy diet such as MIND or DASH diet, increase exercise, avoid trans fats, simple carbohydrates and processed foods, consider a krill or fish or flaxseed oil cap daily.  °
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
## 2023-03-03 NOTE — Assessment & Plan Note (Signed)
Check xray 

## 2023-03-03 NOTE — Assessment & Plan Note (Signed)
Per pulmonary 

## 2023-03-03 NOTE — Assessment & Plan Note (Signed)
Per endo Not controlled--- pt is not taking meds correctly and not eating right  Had a long discussion with pt and her daughter about it but pt refuses to make changes to diet and how to take meds

## 2023-03-03 NOTE — Assessment & Plan Note (Signed)
On thyroid med

## 2023-03-03 NOTE — Assessment & Plan Note (Signed)
Per nephrology 

## 2023-03-03 NOTE — Assessment & Plan Note (Signed)
Per cardiology 

## 2023-03-06 ENCOUNTER — Ambulatory Visit: Payer: Self-pay | Admitting: Licensed Clinical Social Worker

## 2023-03-06 NOTE — Patient Outreach (Signed)
  Care Coordination   Follow Up Visit Note   03/06/2023 Name: Sue [name] MRN: [id number] DOB: Nov 17, 1950  Sue [name] is a 72 y.o. year old female who sees Sue [name], Sue Congress, DO for primary care. I spoke with  Sue [name] / Sue [name], daughter of client, via phone today.  What matters to the patients health and wellness today?  Patient needs more in home care support. Sue [name], daughter of patient, has talked with PCP about PCS application for client     Goals Addressed             This Visit's Progress    Patient needs more in home care support. Sue [name], daughter of patient, has talked with PCP about PCS application for client       Interventions:  Spoke via phone with Sue [name], daughter of client, about client needs. Spoke with Sue [name] about PCS application for client. Sue [name] has talked with PCP about PCS for client LCSW emailed PCP a copy of PCS instructions and PCS application for completion for client. PCP to review form sent Spoke with Sue [name] about client in home care needs. Sue [name] feels that client needs more in home care support for daily needs Informed Sue [name] to call LCSW as needed for SW support for client at [phone]        SDOH assessments and interventions completed:  Yes  SDOH Interventions Today    Flowsheet Row Most Recent Value  SDOH Interventions   Depression Interventions/Treatment  Counseling  Physical Activity Interventions Other (Comments)  [walking challenges]  Stress Interventions Other (Comment)  [stress in completing ADLs and daily activities]        Care Coordination Interventions:  Yes, provided   Interventions Today    Flowsheet Row Most Recent Value  Chronic Disease   Chronic disease during today's visit Other   [spoke with Sue [name], daughter of client, about client needs]  General Interventions   General Interventions Discussed/Reviewed General Interventions Discussed, Community Resources   [reviewed program support]  Exercise Interventions   Exercise Discussed/Reviewed Physical Activity  [walking challenges, difficulty in completing ADLs]  Education Interventions   Education Provided Provided Education  Provided Engineer, [employer] application for client. PCS blank applicatoin has been sent via email to Sue [name] for Dr. Zola [name] to review]  Safety Interventions   Safety Discussed/Reviewed Fall Risk  [walking challenges. Needs help with in home care]         Follow up plan: LCSW to call client or daughter, Sue [name], as scheduled to assess client needs   Encounter Outcome:  Pt. Visit Completed   Sue [name].Sue [name] MSW, LCSW Licensed Visual merchandiser [facility] Care Management [phone]

## 2023-03-06 NOTE — Patient Instructions (Signed)
Visit Information  Thank you for taking time to visit with me today. Please don't hesitate to contact me if I can be of assistance to you.   Following are the goals we discussed today:   Goals Addressed             This Visit's Progress    Patient needs more in home care support. [name], daughter of patient, has talked with PCP about PCS application for client       Interventions:  Spoke via phone with [name], daughter of client, about client needs. Spoke with [name] about PCS application for client. [name] has talked with PCP about PCS for client LCSW emailed PCP a copy of PCS instructions and PCS application for completion for client. PCP to review form sent Spoke with [name] about client in home care needs. [name] feels that client needs more in home care support for daily needs Informed [name] to call LCSW as needed for SW support for client at [phone]       LCSW to call client  or daughter, [name], as scheduled to assess client needs   Please call the care guide team at [phone] if you need to cancel or reschedule your appointment.   If you are experiencing a Mental Health or Behavioral Health Crisis or need someone to talk to, please go to [facility] [address], [place] [phone])   The patient [name], daughter, verbalized understanding of instructions, educational materials, and care plan provided today and DECLINED offer to receive copy of patient instructions, educational materials, and care plan.   The patient / [name], daughter,has been provided with contact information for the care management team and has been advised to call with any health related questions or concerns.   Kelton Pillar.Aleanna Menge MSW, LCSW Licensed Visual merchandiser [facility] Care Management [phone]

## 2023-03-07 ENCOUNTER — Telehealth: Payer: Medicare HMO

## 2023-03-07 ENCOUNTER — Telehealth: Payer: Self-pay

## 2023-03-07 NOTE — Telephone Encounter (Signed)
   CCM RN Visit Note   03/07/23 Name: [name] MRN: [id number]      DOB: 05-01-1951  Subjective: Sue [name] is a 72 y.o. year old female who is a primary care patient of Donato Schultz, DO. The patient was referred to the Chronic Care Management team for assistance with care management needs subsequent to provider initiation of CCM services and plan of care.      An unsuccessful outreach attempt was made today for a scheduled CCM visit.   PLAN Unable to leave a voice message. Patient's voice mailbox is not set up. A member of the care management team will make additional attempts.   [facility]/Chronic Care Management [phone]

## 2023-03-12 ENCOUNTER — Other Ambulatory Visit: Payer: Self-pay | Admitting: Family Medicine

## 2023-03-12 DIAGNOSIS — E039 Hypothyroidism, unspecified: Secondary | ICD-10-CM

## 2023-03-13 MED ORDER — LEVOTHYROXINE SODIUM 100 MCG PO TABS: 100.0000 ug | ORAL_TABLET | Freq: Every day | ORAL | 1 refills | Status: DC

## 2023-03-13 NOTE — Addendum Note (Signed)
Addended byConrad Maharishi [place] D on: 03/13/2023 01:44 PM   Modules accepted: Orders

## 2023-03-14 ENCOUNTER — Other Ambulatory Visit: Payer: Self-pay | Admitting: Behavioral Health

## 2023-03-14 DIAGNOSIS — F411 Generalized anxiety disorder: Secondary | ICD-10-CM

## 2023-03-14 DIAGNOSIS — F5105 Insomnia due to other mental disorder: Secondary | ICD-10-CM

## 2023-03-15 ENCOUNTER — Telehealth: Payer: Self-pay | Admitting: Pharmacist

## 2023-03-15 ENCOUNTER — Telehealth: Payer: Medicare HMO

## 2023-03-15 NOTE — Telephone Encounter (Signed)
Unsuccessful patient outreach for Clinical Pharmacist Practitioner phone visit to review meds and assist with Chronic Care Management / diabetes.

## 2023-03-15 NOTE — Telephone Encounter (Signed)
Unsuccessful 2nd attempt to reach patient for Chronic Care Management / Clinical Pharmacist Practitioner phone visit.   Also tried to contact cell number which I think is her daughter's number - no answer but LM on VM that I was contacting her about scheduled phone appointment at 9am today with Clinical Pharmacist Practitioner.  Provided my CB # [phone] or my schedulers number Penne Lash [phone].

## 2023-03-21 ENCOUNTER — Ambulatory Visit: Payer: Self-pay | Admitting: Licensed Clinical Social Worker

## 2023-03-21 NOTE — Patient Outreach (Signed)
  Care Coordination   Follow Up Visit Note   03/21/2023 Name: Sue [name] MRN: [id number] DOB: November 29, 1950  Sue [name] is a 72 y.o. year old female who sees Zola Button, Grayling Congress, DO for primary care. I spoke with  Sue [name] / Sue [name], daughter of client , via phone today.  What matters to the patients health and wellness today? Patient needs more in home care support. Sue [name], daughter of patient, has talked with PCP about PCS application for client    Goals Addressed             This Visit's Progress    Patient needs more in home care support. Sue [name], daughter of patient, has talked with PCP about PCS application for client       Interventions:  Spoke via phone with Sue [name], daughter of client, about client needs and current status. Sue [name] has spoken with PCP about PCS application for client.  LCSW has emailed PCP instructions for PCS completion and a blank PCS document for PCP to review. Spoke with Sue [name] about client in home care needs. Sue [name] feels that client needs more in home care support for daily needs. Client has home health aide who helps client in the home daily for 2 hours Discussed medication procurement of client.  Discussed edema issues of client Discussed mood of client. Client has family support. Sue [name], daughter of client , is very supportive of client Discussed Meals on Wheels program Discussed Mom's Meals. Sue [name] said she plans to call [facility] [employer] and talk with [facility] representative about Mom's Meals possibly for client Informed Sue [name] to call LCSW as needed for SW support for client at [phone]        SDOH assessments and interventions completed:  Yes  SDOH Interventions Today    Flowsheet Row Most Recent Value  SDOH Interventions   Depression Interventions/Treatment  Counseling  Physical Activity Interventions Other (Comments)  [some walking challenges]  Stress Interventions Other (Comment)   [stress in managing in home daily needs]        Care Coordination Interventions:  Yes, provided   Interventions Today    Flowsheet Row Most Recent Value  Chronic Disease   Chronic disease during today's visit Other  [spoke with Sue [name], daughter of client, about client needs]  General Interventions   General Interventions Discussed/Reviewed General Interventions Discussed, [employer]  [discussed program support]  Exercise Interventions   Exercise Discussed/Reviewed Physical Activity  [client has some walking challenges]  Physical Activity Discussed/Reviewed Physical Activity Discussed  Education Interventions   Education Provided Provided Education  Provided Verbal Education On Community Resources  Mental Health Interventions   Mental Health Discussed/Reviewed Anxiety, Coping Strategies  [discussed mood of client. Client has family support. She has aide who helps her 2 hours daily in the home.  PCS application has been discussed by daughter with PCP]  Pharmacy Interventions   Pharmacy Dicussed/Reviewed Pharmacy Topics Discussed  Safety Interventions   Safety Discussed/Reviewed Fall Risk       Follow up plan: Follow up call scheduled for 05/01/23 at 2:30 PM     Encounter Outcome:  Pt. Visit Completed   Kelton Pillar.Cachet Mccutchen MSW, LCSW Licensed Visual merchandiser [facility] Care Management [phone]

## 2023-03-21 NOTE — Patient Instructions (Signed)
Visit Information  Thank you for taking time to visit with me today. Please don't hesitate to contact me if I can be of assistance to you.   Following are the goals we discussed today:   Goals Addressed             This Visit's Progress    Patient needs more in home care support. [name], daughter of patient, has talked with PCP about PCS application for client       Interventions:  Spoke via phone with [name], daughter of client, about client needs and current status. [name] has spoken with PCP about PCS application for client.  LCSW has emailed PCP instructions for PCS completion and a blank PCS document for PCP to review. Spoke with [name] about client in home care needs. [name] feels that client needs more in home care support for daily needs. Client has home health aide who helps client in the home daily for 2 hours Discussed medication procurement of client.  Discussed edema issues of client Discussed mood of client. Client has family support. [name], daughter of client , is very supportive of client Discussed Meals on Wheels program Discussed Mom's Meals. [name] said she plans to call [facility] and talk with [facility] representative about Mom's Meals possibly for client Informed [name] to call LCSW as needed for SW support for client at [phone]        Our next appointment is by telephone on 05/01/23 at 2:30 PM   Please call the care guide team at [phone] if you need to cancel or reschedule your appointment.   If you are experiencing a Mental Health or Behavioral Health Crisis or need someone to talk to, please go to [facility] [address] [place] [phone])   The patient / [name], daughter, verbalized understanding of instructions, educational materials, and care plan provided today and DECLINED offer to receive copy of patient instructions, educational materials, and care  plan.   The patient / [name], daughter, has been provided with contact information for the care management team and has been advised to call with any health related questions or concerns.   Kelton Pillar.Arvid Marengo MSW, LCSW Licensed Visual merchandiser [facility] Care Management [phone]

## 2023-03-31 ENCOUNTER — Telehealth: Payer: Self-pay

## 2023-03-31 ENCOUNTER — Telehealth: Payer: Medicare HMO

## 2023-03-31 NOTE — Telephone Encounter (Signed)
   CCM RN Visit Note   03/31/23 Name: Sue [name] MRN: [id number]      DOB: 07/20/1951  Subjective: Sue [name] is a 72 y.o. year old female who is a primary care patient of Donato Schultz, DO. The patient was referred to the Chronic Care Management team for assistance with care management needs subsequent to provider initiation of CCM services and plan of care.      An unsuccessful outreach attempt was made today for a scheduled CCM visit.  Unable to leave a voice message due to patient's voice mailbox not being set up.  PLAN: A member of the care management team will make additional outreach attempts.  [facility]/Chronic Care Management [phone]

## 2023-04-19 ENCOUNTER — Ambulatory Visit: Payer: Medicare HMO | Admitting: Pharmacist

## 2023-04-19 DIAGNOSIS — Z794 Long term (current) use of insulin: Secondary | ICD-10-CM

## 2023-04-19 DIAGNOSIS — E1142 Type 2 diabetes mellitus with diabetic polyneuropathy: Secondary | ICD-10-CM

## 2023-04-19 DIAGNOSIS — N1832 Chronic kidney disease, stage 3b: Secondary | ICD-10-CM

## 2023-04-19 DIAGNOSIS — E876 Hypokalemia: Secondary | ICD-10-CM

## 2023-04-19 DIAGNOSIS — E1169 Type 2 diabetes mellitus with other specified complication: Secondary | ICD-10-CM

## 2023-04-19 DIAGNOSIS — E039 Hypothyroidism, unspecified: Secondary | ICD-10-CM

## 2023-04-19 MED ORDER — POTASSIUM CHLORIDE CRYS ER 20 MEQ PO TBCR
20.0000 meq | EXTENDED_RELEASE_TABLET | Freq: Every day | ORAL | 0 refills | Status: DC
Start: 2023-04-19 — End: 2023-07-17

## 2023-04-19 MED ORDER — METOPROLOL SUCCINATE ER 50 MG PO TB24: 100.0000 mg | ORAL_TABLET | Freq: Every day | ORAL | 1 refills | Status: DC

## 2023-04-19 NOTE — Progress Notes (Signed)
04/19/2023 Name: Sue [name] MRN: [id number] DOB: 1951-08-06  Chief Complaint  Patient presents with   Medication Management   Diabetes    KASY DOKE is a 72 y.o. year old female who presented for a telephone visit.   They were referred to the pharmacist by their PCP for assistance in managing diabetes, hypertension, hyperlipidemia, medication access, and complex medication management.    Subjective:  Care Team: Primary Care Provider: Zola Button, Grayling Congress, DO ; Next Scheduled Visit: not currently scheduled Cardiologist: Dr Servando Salina; Next Scheduled Visit: 05/03/2023 Endocrinologist Dr Lonzo Cloud; Next Scheduled Visit: not currently scheduled Neurologist: Dr Lovie Chol ; Next Scheduled Visit: 05/05/2023 Psychiatrist: Avelina Laine, NP; Next Scheduled Visit: not able to see and patient was not sure.   Medication Access/Adherence  Current Pharmacy:  [facility] [address] [place] - [address] [place] [address] Phone: [phone] Fax: [phone]  [id number] [place], [place] - [address] [place] [address] Phone: [phone] Fax: [phone]  [id number] [place], [place] - [address] [place] [address] Phone: [phone] Fax: [phone]   Patient reports affordability concerns with their medications: No  Patient reports access/transportation concerns to their pharmacy: No  - daughter picks up medications Patient reports adherence concerns with their medications:  No   but I noted that she is still taking levothyroxine even though dose was increased to 03/03/2023. She also has not been taking rosuvastatin - she is unsure why.    Diabetes: Managed by Dr Lonzo Cloud  Current medications: Trulicity 3mg  weekly and Toujeo 65 units once a day  Medications tried in the past: metformin - low GFR; glipizide -- stopped due to insulin therapy and  hypoglycemia risk  Current glucose readings: ranges from 100 to 150 Using ACCU-Chek Guide meter; testing 1 to 2 times per week.    Patient denies hypoglycemic s/sx including no dizziness, shakiness, sweating. Patient reports hyperglycemic symptoms including neuropathy but denies polyuria, polydipsia, polyphagia, nocturia, blurred vision.  Hypertension:  Current medications: metoprolol ER 50mg  - take 2 tabs = 100mg  daily and torsemide 20mg  - takes 3 tabs = 60mg  daily  Patient has a validated, automated, upper arm home BP cuff Current blood pressure readings readings: 120's/ 70's  Patient denies hypotensive s/sx including no dizziness, lightheadedness.  Patient denies hypertensive symptoms including no headache, chest pain, shortness of breath  Current meal patterns:  she reports trying to limit intake of salt.   Hyperlipidemia/ASCVD Risk Reduction  Current lipid lowering medications: rosuastatin 10mg  daily but patient states she has not been taking. Verified it is on her last med list she received from PCP.   ASCVD History: TIA Risk Factors: hypertension, Type 2 DM, Age, CHF, CKD  Hypothyroidism:  Current medication: levothyroxine was increased form daily to daily 03/03/2023 but patient is still taking dose of levothyroxine.   Medication Management / Adherence  Current adherence strategy: Daughter helps a little with medications  Per refill history review, adherence low for rosuvastatin and Trulicity. Patient is not currently taking new dose of levothyroxine of daily - still taking daily.   Multiple comorbidities Complex medication regimen Independent pausing, stopping or controlling of the medications  Objective:  Lab Results  Component Value Date   HGBA1C 9.9 (H) 02/03/2023    Lab Results  Component Value Date   CREATININE 1.63 (H) 03/03/2023   BUN 13 03/03/2023  NA 139 03/03/2023   K 3.3 (L) 03/03/2023   CL 99 03/03/2023   CO2  31 03/03/2023    Lab Results  Component Value Date   CHOL 130 03/03/2023   HDL 35.00 (L) 03/03/2023   LDLCALC 78 03/03/2023   TRIG 84.0 03/03/2023   CHOLHDL 4 03/03/2023    Medications Reviewed Today   Medications were not reviewed in this encounter       Assessment/Plan:   Diabetes:Last A1c was not at goal of < 7.5%; Home blood glucose per patient has been at goal.  - Reviewed goal A1c, goal fasting, and goal 2 hour post prandial glucose - Called [facility] to refill Trulicity. Continue to take Trulicity 3mg  SQ weekly and Toujeo 65 units once a day - Recommended she call for follow up with endocrinology   - Recommend to check glucose once daily    Hypertension: Home blood pressure has been at goal of < 130/85 but last office blood pressure was above goal. - Reviewed long term cardiovascular and renal outcomes of uncontrolled blood pressure - Reviewed appropriate blood pressure monitoring technique and reviewed goal blood pressure. Recommended to check home blood pressure and heart rate 2 to 3 times per week - Recommend to continue metoprolol (refill requested) and torsemide.  - Discussed limiting sodium in diet and adding fruits and vegetables.  CKD 3B:  - Reminded patient to avoid nephrotoxic medications list NSAIDs / ibuprofen - Continue to follow up with nephrologist  Hyperlipidemia/ASCVD Risk Reduction: LDL goal < 70 - last LDL was 71 - Restart rosuvastatin 10mg  daily - requested refill with patient's approval.   Hypothyroidism:  - patient reminded of dose change form 03/03/2023. Requested refill for levothyroxine daily with patient's permission from [facility].  - due to recheck TSH when on new dose for 6 to 8 weeks.   Medication Management: - Currently strategy insufficient to maintain appropriate adherence to prescribed medication regimen - Suggested use of weekly pill box to organize medications - Following med refills were requested- Trulicity 3mg ,  rosuvastatin 10mg , levothyroxine , potassium 20 mEq and metoprolol ER 50mg   Meds ordered this encounter  Medications   potassium chloride SA (KLOR-CON M) 20 MEQ tablet    Sig: Take 1 tablet (20 mEq total) by mouth daily.    Dispense:  90 tablet    Refill:  0   metoprolol succinate (TOPROL-XL) 50 MG 24 hr tablet    Sig: Take 2 tablets (100 mg total) by mouth daily. Take with or immediately following a meal    Dispense:  180 tablet    Refill:  1   - Patient not taking magnesium supplement currently. Will check magnesium level with next labs.   Health Maintenance - Discussed annual diabetic eye exam. Patient reports she had cataract surgery this year by Dr Wynelle Link but she was not sure who her optometrist / referring Dr was. Called Dr Chase Caller office and she was referred by Dr Velna Ochs. Called Dr Harriett Rush office to request copy of last eye exam. They are faxing over.   Follow Up Plan: 6 weeks.   Henrene Pastor, PharmD Clinical Pharmacist [facility]

## 2023-04-24 ENCOUNTER — Telehealth: Payer: Medicare PPO | Admitting: Behavioral Health

## 2023-04-24 ENCOUNTER — Encounter: Payer: Self-pay | Admitting: Behavioral Health

## 2023-04-24 DIAGNOSIS — F5105 Insomnia due to other mental disorder: Secondary | ICD-10-CM

## 2023-04-24 DIAGNOSIS — F99 Mental disorder, not otherwise specified: Secondary | ICD-10-CM | POA: Diagnosis not present

## 2023-04-24 DIAGNOSIS — F411 Generalized anxiety disorder: Secondary | ICD-10-CM

## 2023-04-24 DIAGNOSIS — F3131 Bipolar disorder, current episode depressed, mild: Secondary | ICD-10-CM

## 2023-04-24 MED ORDER — LORAZEPAM 1 MG PO TABS
ORAL_TABLET | ORAL | 3 refills | Status: DC
Start: 2023-04-24 — End: 2023-10-11

## 2023-04-24 MED ORDER — ZOLPIDEM TARTRATE ER 12.5 MG PO TBCR
12.5000 mg | EXTENDED_RELEASE_TABLET | Freq: Every evening | ORAL | 3 refills | Status: DC | PRN
Start: 2023-04-24 — End: 2023-05-17

## 2023-04-24 MED ORDER — QUETIAPINE FUMARATE 200 MG PO TABS
200.0000 mg | ORAL_TABLET | Freq: Every day | ORAL | 3 refills | Status: DC
Start: 2023-04-24 — End: 2024-01-08

## 2023-04-24 MED ORDER — BUSPIRONE HCL 15 MG PO TABS
15.0000 mg | ORAL_TABLET | Freq: Two times a day (BID) | ORAL | 3 refills | Status: DC
Start: 2023-04-24 — End: 2024-01-11

## 2023-04-24 NOTE — Progress Notes (Addendum)
Sue [name] [id number] 07/07/1951 72 y.o.  Virtual Visit via Video Note  I connected with pt @ on 04/24/23 at  3:30 PM EDT by a video enabled telemedicine application and verified that I am speaking with the correct person using two identifiers.   I discussed the limitations of evaluation and management by telemedicine and the availability of in person appointments. The patient expressed understanding and agreed to proceed.  I discussed the assessment and treatment plan with the patient. The patient was provided an opportunity to ask questions and all were answered. The patient agreed with the plan and demonstrated an understanding of the instructions.   The patient was advised to call back or seek an in-person evaluation if the symptoms worsen or if the condition fails to improve as anticipated.  I provided 20 minutes of non-face-to-face time during this encounter.  The patient was located at home.  The provider was located at [facility].   Joan Flores, NP   Subjective:   Patient ID:  Sue [name] is a 72 y.o. (DOB [birth year]) female.  Chief Complaint:  Chief Complaint  Patient presents with   Anxiety   Depression   Follow-up   Medication Refill   Patient Education   Stress    HPI  72  year old female presents to this office for follow up and medication management.  No psychosocial changes this visit. Says she is managing the best she can. Says that she has no complaint today.  Requesting no medication changes this visit. Says she is doing ok.  Currently her anxiety level is 3/10 and depression is 3/10 but says that health issues keep her at this level.  She denies having any problems taking this medication. Denies any current mania which she said was dominate when she was younger. No psychosis, no SI/HI. She has strong family hx of bipolar and schizophrenia on fathers side.    Unable to obtain prior medication list.      Review of Systems:  Review of  Systems  Constitutional: Negative.   Allergic/Immunologic: Negative.   Neurological: Negative.   Psychiatric/Behavioral:  Positive for dysphoric mood. The patient is nervous/anxious.     Medications: I have reviewed the patient's current medications.  Current Outpatient Medications  Medication Sig Dispense Refill   Accu-Chek Softclix Lancets lancets USE 1  TO CHECK GLUCOSE 4 TIMES DAILY 100 each 2   albuterol (VENTOLIN HFA) 108 (90 Base) MCG/ACT inhaler Inhale 1-2 puffs into the lungs every 6 (six) hours as needed for wheezing or shortness of breath. 1 each 0   allopurinol (ZYLOPRIM) 100 MG tablet Take 100 mg by mouth daily as needed (for gout flareups). (Patient not taking: Reported on 04/19/2023)     Blood Glucose Monitoring Suppl (ACCU-CHEK GUIDE) w/Device KIT USE   TO CHECK GLUCOSE UP TO 4 TIMES DAILY AS DIRECTED 1 kit 0   busPIRone (BUSPAR) 15 MG tablet Take 1 tablet (15 mg total) by mouth 2 (two) times daily. 60 tablet 3   diclofenac Sodium (VOLTAREN ARTHRITIS PAIN) 1 % GEL Apply 4 g topically 4 (four) times daily. 150 g 2   Dulaglutide (TRULICITY) 3 MG/0.5ML SOPN Inject 3 mg as directed once a week. 0.5 mL 3   famotidine (PEPCID) 20 MG tablet Take 20 mg by mouth daily as needed for heartburn or indigestion.     glucose blood (ACCU-CHEK GUIDE) test strip USE 1 STRIP TO CHECK GLUCOSE 4 TIMES DAILY 200 each 12   insulin  glargine, 1 Unit Dial, (TOUJEO SOLOSTAR) 300 UNIT/ML Solostar Pen Inject 64 Units into the skin daily in the afternoon. 15 mL 6   Insulin Pen Needle 29G X MISC 1 Device by Does not apply route daily in the afternoon. 100 each 3   levothyroxine (SYNTHROID) 100 MCG tablet Take 1 tablet (100 mcg total) by mouth daily before breakfast. (Patient not taking: Reported on 04/19/2023) 90 tablet 1   LORazepam (ATIVAN) 1 MG tablet TAKE 1/2 TO 1 AND 1/2 TABLETS AT BEDTIME AND AN ADDITIONAL 1/2 TAB ONCE A DAY AS NEEDED FOR AXIETY 60 tablet 3   Magnesium 400 MG CAPS Take 1 capsule by  mouth daily. (Patient not taking: Reported on 04/19/2023) 30 capsule 0   metoprolol succinate (TOPROL-XL) 50 MG 24 hr tablet Take 2 tablets (100 mg total) by mouth daily. Take with or immediately following a meal 180 tablet 1   OXYGEN Inhale 3-4 L/min into the lungs continuous.      potassium chloride SA (KLOR-CON M) 20 MEQ tablet Take 1 tablet (20 mEq total) by mouth daily. 90 tablet 0   pregabalin (LYRICA) 50 MG capsule Take 1 capsule (50 mg total) by mouth 3 (three) times daily. 90 capsule 5   QUEtiapine (SEROQUEL) 200 MG tablet Take 1 tablet (200 mg total) by mouth at bedtime. 90 tablet 3   rosuvastatin (CRESTOR) 10 MG tablet Take 1 tablet (10 mg total) by mouth daily. For heart health and to lower cholesterol. 90 tablet 3   Suvorexant (BELSOMRA) 15 MG TABS Take 1 tablet (15 mg total) by mouth at bedtime. (Patient not taking: Reported on 04/19/2023) 30 tablet 1   torsemide (DEMADEX) 20 MG tablet Take 3 tablets by mouth once daily 270 tablet 1   zolpidem (AMBIEN CR) 12.5 MG CR tablet Take 1 tablet (12.5 mg total) by mouth at bedtime as needed. for sleep 30 tablet 3   No current facility-administered medications for this visit.    Medication Side Effects: None  Allergies:  Allergies  Allergen Reactions   Hydrocodone Other (See Comments)    Constipation and hallucinations   Norvasc [Amlodipine Besylate] Swelling and Other (See Comments)    Marked swelling of the limbs   Tizanidine Other (See Comments)    After the 3rd dose, the patient's mouth began to feel numb and she felt like she was a having a "hot flash"    Past Medical History:  Diagnosis Date   Allergic rhinitis    Depression    Diabetes mellitus type 2, uncontrolled    Emphysema of lung (HCC)    3L home O2   GERD (gastroesophageal reflux disease)    Hypertension    Hypothyroidism    Obesity, morbid, BMI 50 or higher (HCC)    Stroke (HCC) 2016   TIA    Urine incontinence     Family History  Problem Relation Age of  Onset   Heart disease Father        MVP and Pics Valve   Hypertension Father    Depression Father        Institutionalized x's 2 years   Bipolar disorder Father    Hypertension Sister    Diabetes Sister    Hyperlipidemia Sister    Heart disease Sister 15       MI   Heart disease Brother    Hypertension Brother    Schizophrenia Paternal Aunt    Depression Paternal Aunt    Anxiety disorder Paternal Aunt  Heart disease Paternal Aunt    Schizophrenia Paternal Aunt    Heart disease Paternal Uncle    Heart disease Paternal Grandmother    Asthma Son    Asthma Son     Social History   Socioeconomic History   Marital status: Divorced    Spouse name: Not on file   Number of children: 4   Years of education: 14   Highest education level: Associate degree: academic program  Occupational History   Occupation: disabled- Biochemist, clinical Med  Tobacco Use   Smoking status: Former    Current packs/day: 0.00    Average packs/day: 1 pack/day for 40.0 years (40.0 ttl pk-yrs)    Types: Cigarettes    Start date: 07/21/1972    Quit date: 10/16/2011    Years since quitting: 11.5   Smokeless tobacco: Never  Vaping Use   Vaping status: Never Used  Substance and Sexual Activity   Alcohol use: Not Currently    Comment: Occ-- Wine   Drug use: No   Sexual activity: Not Currently  Other Topics Concern   Not on file  Social History Narrative      Originally from [place]. Has also lived in [place], [place]. She also previously lived in [place] for 20 years. No history of Valley Fever. Moved to [place] in 1989. 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 No pets currently. No bird exposure. Questionable previous mold exposure in her daughter's home. 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       Lives in [place] alone in assisted living.  Has multiple chronic illness.      Are you right handed or left handed? Right handed   Are you currently employed ? no   What is your current occupation? retired   Do you live at home alone?no   Who lives with you? son   What type of home do you live in: 1 story or 2 story? Apartment first floor       Social Determinants of Health   Financial Resource Strain: Low Risk  (01/24/2022)   Overall Financial Resource Strain (CARDIA)    Difficulty of Paying Living Expenses: Not hard at all  Food Insecurity: No Food Insecurity (01/27/2023)   Hunger Vital Sign    Worried About Running Out of Food in the Last Year: Never true    Ran Out of Food in the Last Year: Never true  Transportation Needs: No Transportation Needs (01/27/2023)   PRAPARE - Administrator, Civil Service (Medical): No    Lack of Transportation (Non-Medical): No  Physical Activity: Inactive (03/21/2023)   Exercise Vital Sign    Days of Exercise per Week: 0 days    Minutes of Exercise per Session: 0 min  Stress: Stress Concern Present (03/21/2023)   [employer] of Occupational Health - Occupational Stress Questionnaire    Feeling of Stress : To some extent  Social Connections: Unknown (11/03/2022)   [facility] from [facility]   Social Network    Social Network: Not on file  Intimate Partner Violence: Not At Risk (01/27/2023)   Humiliation, Afraid, Rape, and Kick questionnaire    Fear of Current or Ex-Partner: No    Emotionally Abused: No    Physically Abused:  No    Sexually Abused: No    Past Medical History, Surgical history, Social history, and Family history were reviewed and updated as appropriate.   Please see review of systems for further details on the patient's review from today.   Objective:   Physical Exam:  There were no vitals taken for this visit.  Physical Exam Neurological:     Mental Status: She is alert and  oriented to person, place, and time.  Psychiatric:        Attention and Perception: Attention and perception normal.        Mood and Affect: Mood normal.        Speech: Speech normal.        Behavior: Behavior normal. Behavior is cooperative.        Cognition and Memory: Cognition and memory normal.        Judgment: Judgment normal.     Comments: Insight intact     Lab Review:     Component Value Date/Time   NA 139 03/03/2023 0924   NA 137 05/27/2021 1511   K 3.3 (L) 03/03/2023 0924   CL 99 03/03/2023 0924   CO2 31 03/03/2023 0924   GLUCOSE 120 (H) 03/03/2023 0924   BUN 13 03/03/2023 0924   BUN 13 05/27/2021 1511   CREATININE 1.63 (H) 03/03/2023 0924   CREATININE 1.45 (H) 07/09/2021 1446   CALCIUM 8.3 (L) 03/03/2023 0924   PROT 8.0 03/03/2023 0924   ALBUMIN 3.3 (L) 03/03/2023 0924   AST 22 03/03/2023 0924   ALT 12 03/03/2023 0924   ALKPHOS 111 03/03/2023 0924   BILITOT 0.6 03/03/2023 0924   GFRNONAA 31 (L) 12/23/2022 1101   GFRAA 31 (L) 01/01/2020 0951       Component Value Date/Time   WBC 6.2 03/03/2023 0924   RBC 3.77 (L) 03/03/2023 0924   HGB 11.9 (L) 03/03/2023 0924   HGB 12.6 01/17/2018 1616   HCT 36.5 03/03/2023 0924   HCT 32.3 (L) 03/10/2019 0545   PLT 178.0 03/03/2023 0924   PLT 235 01/17/2018 1616   MCV 96.8 03/03/2023 0924   MCV 96 01/17/2018 1616   MCH 32.3 12/23/2022 1101   MCHC 32.5 03/03/2023 0924   RDW 13.1 03/03/2023 0924   RDW 14.0 01/17/2018 1616   LYMPHSABS 2.5 03/03/2023 0924   MONOABS 0.5 03/03/2023 0924   EOSABS 0.2 03/03/2023 0924   BASOSABS 0.0 03/03/2023 0924    No results found for: "POCLITH", "LITHIUM"   No results found for: "PHENYTOIN", "PHENOBARB", "VALPROATE", "CBMZ"   .res Assessment: Plan:     Recommendations/Plan   Greater than 50% of  20  min  telephonic interview with patient was spent on counseling and coordination of care.We reviewed her currently level of stability.  She is not requesting any changes today but  needs refills.  Today we agreed to: Continue Zolpidem 12.5 mg CR Continue Buspar 15 mg twice daily Continue Ativan 0.5-1.5 tablets  (1 mg 0.5-1.5 mg total) by mouth. Take 1-1.5 mg at bedtime and an additional 0.5 mg once a day needed for anxiety. Increased  Seroquel to 200 mg daily at bedtime To contact this office if worsening symptoms Will follow up in 6 months to reassess. She is requesting video visit next time due to ambulation and transportation issues. Provided emergency contact information Reviewed PDMP   Arlys John A. , NP        Sue [name] was seen today for anxiety, depression, follow-up, medication refill, patient education and stress.  Diagnoses and all orders for this visit:  Generalized anxiety disorder -     LORazepam (ATIVAN) 1 MG tablet; TAKE 1/2 TO 1 AND 1/2 TABLETS AT BEDTIME AND AN ADDITIONAL 1/2 TAB ONCE A DAY AS NEEDED FOR AXIETY -     busPIRone (BUSPAR) 15 MG tablet; Take 1 tablet (15 mg total) by mouth 2 (two) times daily. -     QUEtiapine (SEROQUEL) 200 MG tablet; Take 1 tablet (200 mg total) by mouth at bedtime.  Insomnia due to other mental disorder -     zolpidem (AMBIEN CR) 12.5 MG CR tablet; Take 1 tablet (12.5 mg total) by mouth at bedtime as needed. for sleep -     QUEtiapine (SEROQUEL) 200 MG tablet; Take 1 tablet (200 mg total) by mouth at bedtime.  Bipolar affective disorder, currently depressed, mild (HCC) -     QUEtiapine (SEROQUEL) 200 MG tablet; Take 1 tablet (200 mg total) by mouth at bedtime.     Please see After Visit Summary for patient specific instructions.  Future Appointments  Date Time Provider Department Center  05/01/2023  2:30 PM Isaiah Blakes, LCSW [facility] None  05/03/2023 10:00 AM Tobb, Kardie, DO [facility] None  05/05/2023  8:30 AM Antony Madura, MD [facility] None  05/25/2023  9:00 AM Eckard, Tammy, RPH-CPP [facility] None    No orders of the defined types were placed in this encounter.      -------------------------------

## 2023-04-27 NOTE — Progress Notes (Signed)
I saw GINEVRA PUGLISE in neurology clinic on 05/05/23 in follow up for neuropathy.  HPI: ANASTASSIA SOENS is a 72 y.o. year old female with a history of DM, HFpEF, hypothyroidism, HTN, COPD, depression, anxiety, possible TIA in 2016, and former smoker who we last saw on 02/03/23.  To briefly review: Initial consultation (06/09/22): Symptoms started about 11/2021. She has a burning and stinging pain in her feet and legs. She feels like something is squeezing her legs. She has the feeling of [place]orses in her legs. She feels that the left side is worse than the right. She reports that her left leg pain is near constant while the right seems more intermittent. The pain is mostly from the calf down, but occasional cramping in her thighs. She denies back pain, neck pain, and only rare headaches. She denies numbness or tingling in her hands or arms. Patient walks with a walker for the last 5 years (~2018). She has not had any recent falls.   Patient saw PCP (Dr. Zola Button) on 03/14/22 and mentioned stinging and burning in legs that was worse in the evening. She denied back pain. Patient was tried on gabapentin 100 mg that did not help. She did not feel good on the medication, so she stopped it. She took it about a week. She was referred to neurology for further evaluation.   Patient is on seroquel 200 mg at night to help sleep.   She takes vitamin D and vitamin B12 supplements. She is also taking a nerve supplement from [employer] Isla Pence) that she thinks is helping.    The patient denies symptoms suggestive of oculobulbar weakness including diplopia, ptosis, dysphagia, poor saliva control, dysarthria/dysphonia, impaired mastication, facial weakness/droop.   The patient does not report symptoms referable to autonomic dysfunction including impaired sweating, heat or cold intolerance, excessive mucosal dryness, gastroparetic early satiety, postprandial abdominal bloating, constipation, bowel or bladder  dyscontrol, or syncope/presyncope/orthostatic intolerance.   She does not report any constitutional symptoms like fever, night sweats, anorexia or unintentional weight loss.   EtOH use: None  Restrictive diet? No Family history of neuropathy/myopathy/NM disease? None   She had not had any physical therapy recently (as of 06/09/22).  02/03/23: MMA and B6 were normal. IFE was indeterminate. EMG on 11/27/21 showed a possible mild sensory predominant neuropathy (vs age related changes). Patient was not getting adequate relief from gabapentin, so was switched to Lyrica 25 mg BID on 11/30/22.    Patient was to have a virtual visit on 12/14/22, but technical problems did not allow [place] to connect. Per phone conversation, patient was doing okay without acute issues, so follow up was rescheduled.   Patient called on 01/06/23 still having leg pain. Lyrica was increased to 50 mg BID on 01/06/23.    Patient is still having a lot of pain. She has pain in feet all the way into the chest. It is a stabbing pain or a bad itch. It can go from feet to thighs. It can wake her up from her sleep. Most of the pain is in her feet and can go up to knee. She is having a lot of knee pain and has had to have injections previously. She has not had the injections in over a year. She has not talked to anyone about this. She is not moving around well currently. She does not feel she is eating well. She has lost about 50 pounds in the last month or so, which she attributes  to not eating. She denies fever or night sweats.   Patient usually has edema in her legs, but now does not have edema and has a lot of pain.   Most recent Assessment and Plan (02/03/23): This is RENEZMAE DENKER, a 72 y.o. female with pain in bilateral lower extremities. 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 She has previously failed gabapentin and continues to have increase in pain despite increasing doses of Lyrica. She does get some relief, but it  does not last long enough. I will increase the frequency to TID to see if this helps. Her increase in pain also raises concern for an etiology that has not been identified. She does have DM, but perhaps this has been recently worsening. Her last A1c was 03/2022, so I will repeat today. I will also get other labs to look for other treatable causes that we may be missing. Finally, she describes significant knee pain, so perhaps there is an overlapping MSK component as well. She will follow up with her orthopedist to evaluate this.   Plan: -Blood work: B1, Repeat IFE, HbA1c -Increase Lyrica to 50 mg TID -Lidocaine cream PRN -Patient to see orthopaedist for knee pain  Since their last visit: Labs showed HbA1c of 9.9 (up from 7.7 one year prior), IFE with no M protein, and normal B1. I recommended patient discuss glucose control with PCP to prevent worsening neuropathy among other complications. Patient has not yet seen her DM doctor.  Patient continues to have burning and stabbing pain in her feet. After increasing Lyrica to 50 mg TID, patient did have some improvement at first, but things got worse again. She is not having side effects to the medication.  Patient was bit by a spider a few months ago in the back of the neck. She is not sure it is healing well. She may have some numbness in her right arm since, more in the 4th and 5th digits.   MEDICATIONS:  Outpatient Encounter Medications as of 05/05/2023  Medication Sig Note   Accu-Chek Softclix Lancets lancets USE 1  TO CHECK GLUCOSE 4 TIMES DAILY    Blood Glucose Monitoring Suppl (ACCU-CHEK GUIDE) w/Device KIT USE   TO CHECK GLUCOSE UP TO 4 TIMES DAILY AS DIRECTED    busPIRone (BUSPAR) 15 MG tablet Take 1 tablet (15 mg total) by mouth 2 (two) times daily.    diclofenac Sodium (VOLTAREN ARTHRITIS PAIN) 1 % GEL Apply 4 g topically 4 (four) times daily.    Dulaglutide (TRULICITY) 3 MG/0.5ML SOPN Inject 3 mg as directed once a week.    famotidine  (PEPCID) 20 MG tablet Take 20 mg by mouth daily as needed for heartburn or indigestion.    glucose blood (ACCU-CHEK GUIDE) test strip USE 1 STRIP TO CHECK GLUCOSE 4 TIMES DAILY    insulin glargine, 1 Unit Dial, (TOUJEO SOLOSTAR) 300 UNIT/ML Solostar Pen Inject 64 Units into the skin daily in the afternoon.    Insulin Pen Needle 29G X MISC 1 Device by Does not apply route daily in the afternoon.    levothyroxine (SYNTHROID) 100 MCG tablet Take 1 tablet (100 mcg total) by mouth daily before breakfast. 04/19/2023: 04/19/2023 - still taking   LORazepam (ATIVAN) 1 MG tablet TAKE 1/2 TO 1 AND 1/2 TABLETS AT BEDTIME AND AN ADDITIONAL 1/2 TAB ONCE A DAY AS NEEDED FOR AXIETY    metoprolol succinate (TOPROL-XL) 50 MG 24 hr tablet Take 2 tablets (100 mg total) by  mouth daily. Take with or immediately following a meal    OXYGEN Inhale 3-4 L/min into the lungs continuous.     potassium chloride SA (KLOR-CON M) 20 MEQ tablet Take 1 tablet (20 mEq total) by mouth daily.    QUEtiapine (SEROQUEL) 200 MG tablet Take 1 tablet (200 mg total) by mouth at bedtime.    rosuvastatin (CRESTOR) 10 MG tablet Take 1 tablet (10 mg total) by mouth daily. For heart health and to lower cholesterol.    torsemide (DEMADEX) 20 MG tablet Take 3 tablets by mouth once daily    zolpidem (AMBIEN CR) 12.5 MG CR tablet Take 1 tablet (12.5 mg total) by mouth at bedtime as needed. for sleep    [DISCONTINUED] pregabalin (LYRICA) 50 MG capsule Take 1 capsule (50 mg total) by mouth 3 (three) times daily.    albuterol (VENTOLIN HFA) 108 (90 Base) MCG/ACT inhaler Inhale 1-2 puffs into the lungs every 6 (six) hours as needed for wheezing or shortness of breath. (Patient not taking: Reported on 05/05/2023)    allopurinol (ZYLOPRIM) 100 MG tablet Take 100 mg by mouth daily as needed (for gout flareups). (Patient not taking: Reported on 04/19/2023)    Magnesium 400 MG CAPS Take 1 capsule by mouth daily. (Patient not taking: Reported on 04/19/2023)     pregabalin (LYRICA) 50 MG capsule Take 50 mg in the morning (1 capsule), take 50 mg midday (1 capsule), and take 100 mg in the evening (2 capsules)    Suvorexant (BELSOMRA) 15 MG TABS Take 1 tablet (15 mg total) by mouth at bedtime. (Patient not taking: Reported on 04/19/2023)    No facility-administered encounter medications on file as of 05/05/2023.    PAST MEDICAL HISTORY: Past Medical History:  Diagnosis Date   Allergic rhinitis    Depression    Diabetes mellitus type 2, uncontrolled    Emphysema of lung (HCC)    3L home O2   GERD (gastroesophageal reflux disease)    Hypertension    Hypothyroidism    Obesity, morbid, BMI 50 or higher (HCC)    Stroke (HCC) 2016   TIA    Urine incontinence     PAST SURGICAL HISTORY: Past Surgical History:  Procedure Laterality Date   ABDOMINAL HYSTERECTOMY     CESAREAN SECTION     COLONOSCOPY N/A 08/19/2019   Procedure: COLONOSCOPY;  Surgeon: Vida Rigger, MD;  Location: [facility];  Service: Endoscopy;  Laterality: N/A;   COLONOSCOPY WITH PROPOFOL N/A 08/05/2019   Procedure: COLONOSCOPY WITH PROPOFOL;  Surgeon: Charlott Rakes, MD;  Location: [facility];  Service: Gastroenterology;  Laterality: N/A;   COLONOSCOPY WITH PROPOFOL N/A 11/19/2019   Procedure: COLONOSCOPY WITH PROPOFOL;  Surgeon: Bernette Redbird, MD;  Location: [facility];  Service: Endoscopy;  Laterality: N/A;  Unprepped   COLONOSCOPY WITH PROPOFOL N/A 11/22/2019   Procedure: COLONOSCOPY WITH PROPOFOL;  Surgeon: Bernette Redbird, MD;  Location: [facility];  Service: Endoscopy;  Laterality: N/A;   COLONOSCOPY WITH PROPOFOL N/A 11/23/2019   Procedure: COLONOSCOPY WITH PROPOFOL;  Surgeon: Kerin Salen, MD;  [facility]: [facility];  Service: Gastroenterology;  Laterality: N/A;   COLONOSCOPY WITH PROPOFOL N/A 11/29/2019   Procedure: COLONOSCOPY WITH PROPOFOL;  Surgeon: Charlott Rakes, MD;  [facility];  Service: Endoscopy;  Laterality: N/A;   ENTEROSCOPY N/A 11/24/2019    Procedure: ENTEROSCOPY;  Surgeon: Kerin Salen, MD;  [facility]: [facility];  Service: Gastroenterology;  Laterality: N/A;   ENTEROSCOPY N/A 11/27/2019   Procedure: ENTEROSCOPY;  Surgeon: Charlott Rakes, MD;  [facility]: [facility];  Service: Endoscopy;  Laterality: N/A;   ESOPHAGOGASTRODUODENOSCOPY N/A 11/27/2019   Procedure: ESOPHAGOGASTRODUODENOSCOPY (EGD);  Surgeon: Charlott Rakes, MD;  Location: [facility];  Service: Endoscopy;  Laterality: N/A;   ESOPHAGOGASTRODUODENOSCOPY (EGD) WITH PROPOFOL N/A 11/24/2019   Procedure: ESOPHAGOGASTRODUODENOSCOPY (EGD) WITH PROPOFOL;  Surgeon: Kerin Salen, MD;  [facility];  Service: Gastroenterology;  Laterality: N/A;  PUSH enteroscopy   GIVENS CAPSULE STUDY N/A 11/19/2019   Procedure: GIVENS CAPSULE STUDY;  Surgeon: Bernette Redbird, MD;  [facility];  Service: Endoscopy;  Laterality: N/A;  To be performed immediately following colonoscopy   GIVENS CAPSULE STUDY N/A 11/24/2019   Procedure: GIVENS CAPSULE STUDY;  Surgeon: Kerin Salen, MD;  [facility];  Service: Gastroenterology;  Laterality: N/A;   GIVENS CAPSULE STUDY N/A 11/28/2019   Procedure: GIVENS CAPSULE STUDY;  Surgeon: Charlott Rakes, MD;  [facility];  Service: Endoscopy;  Laterality: N/A;   HEMOSTASIS CLIP PLACEMENT  11/19/2019   Procedure: HEMOSTASIS CLIP PLACEMENT;  Surgeon: Bernette Redbird, MD;  [facility];  Service: Endoscopy;;   HEMOSTASIS CLIP PLACEMENT  11/22/2019   Procedure: HEMOSTASIS CLIP PLACEMENT;  Surgeon: Bernette Redbird, MD;  [facility];  Service: Endoscopy;;   HOT HEMOSTASIS N/A 11/24/2019   Procedure: HOT HEMOSTASIS (ARGON PLASMA COAGULATION/BICAP);  Surgeon: Kerin Salen, MD;  Location: [facility];  Service: Gastroenterology;  Laterality: N/A;   HOT HEMOSTASIS N/A 11/27/2019   Procedure: HOT HEMOSTASIS (ARGON PLASMA COAGULATION/BICAP);  Surgeon: Charlott Rakes, MD;  Location: [facility];  Service:  Endoscopy;  Laterality: N/A;   SUBMUCOSAL TATTOO INJECTION  11/19/2019   Procedure: SUBMUCOSAL TATTOO INJECTION;  Surgeon: Bernette Redbird, MD;  [facility]: [facility];  Service: Endoscopy;;    ALLERGIES: Allergies  Allergen Reactions   Hydrocodone Other (See Comments)    Constipation and hallucinations   Norvasc [Amlodipine Besylate] Swelling and Other (See Comments)    Marked swelling of the limbs   Tizanidine Other (See Comments)    After the 3rd dose, the patient's mouth began to feel numb and she felt like she was a having a "hot flash"    FAMILY HISTORY: Family History  Problem Relation Age of Onset   Heart disease Father        MVP and Pics Valve   Hypertension Father    Depression Father        Institutionalized x's 2 years   Bipolar disorder Father    Hypertension Sister    Diabetes Sister    Hyperlipidemia Sister    Heart disease Sister 19       MI   Heart disease Brother    Hypertension Brother    Schizophrenia Paternal Aunt    Depression Paternal Aunt    Anxiety disorder Paternal Aunt    Heart disease Paternal Aunt    Schizophrenia Paternal Aunt    Heart disease Paternal Uncle    Heart disease Paternal Grandmother    Asthma Son    Asthma Son     SOCIAL HISTORY: Social History   Tobacco Use   Smoking status: Former    Current packs/day: 0.00    Average packs/day: 1 pack/day for 40.0 years (40.0 ttl pk-yrs)    Types: Cigarettes    Start date: 07/21/1972    Quit date: 10/16/2011    Years since quitting: 11.5   Smokeless tobacco: Never  Vaping Use   Vaping status: Never Used  Substance Use Topics   Alcohol use: Not Currently    Comment: Occ-- Wine  Drug use: No   Social History   Social History Narrative      Originally from [place]. Has also lived in [place], [place]. She also previously lived in [place] for 20 years. No history of [place] Fever. Moved to [place] in 1989. 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 No pets currently. No bird exposure. Questionable previous mold exposure in her daughter's home. 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       Lives in [place] alone in assisted living.  Has multiple chronic illness.      Are you right handed or left handed? Right handed   Are you currently employed ? no   What is your current occupation? retired   Do you live at home alone?no   Who lives with you? son   What type of home do you live in: 1 story or 2 story? Apartment first floor        Objective:  Vital Signs:  BP (!) 155/75   Pulse 99   Ht 5\' 3"  (1.6 m)   Wt 269 lb (122 kg)   SpO2 94%   BMI 47.65 kg/m   General: General appearance: Awake and alert. No distress. Cooperative with exam.  HEENT: Anicteric. Defect of skin in mid neck, around C7 vertebra that patient describes as bug bite. No redness, discharge, or warmth appreciated. Lungs: Conversational dyspnea.   Neurological: Mental Status: Alert. Speech fluent. No pseudobulbar affect Cranial Nerves: CNII: No RAPD. Visual fields intact. CNIII, IV, VI: PERRL. No nystagmus. EOMI. CN V: Facial sensation intact bilaterally to fine touch. CN VII: Facial muscles symmetric and strong. No ptosis at rest. CN VIII: Hears finger rub well bilaterally. CN IX: No hypophonia. CN X: Palate elevates symmetrically. CN XI: Full strength shoulder shrug bilaterally. CN XII: Tongue protrusion full and midline. No atrophy or fasciculations. No significant dysarthria Motor: Tone is normal.  Individual muscle group testing (MRC grade out of 5):  Movement     Neck flexion 5    Neck extension 5     Right Left   Shoulder abduction 5 5   Shoulder adduction 5 5   Elbow flexion 5 5   Elbow extension 5 5   Finger abduction - FDI 4+ 4+   Finger abduction -  ADM 4+ 4+   Finger extension 5 5   Finger distal flexion - 2/3 5- 5-   Finger distal flexion - 4/5 5- 5-   Thumb flexion - FPL 5 5    Hip flexion 5 5   Hip extension 5 5   Hip adduction 5 5   Hip abduction 5 5   Knee extension 5 5   Knee flexion 5 5   Dorsiflexion 5 5   Plantarflexion 5 5    Reflexes:  Right Left  Bicep 1+ 1+  Tricep 1+ 1+  BrRad 1+ 1+  Knee Trace Trace  Ankle 0 0   Sensation: Pinprick: Diminished in bilateral lower extremities. Intact in upper extremities. Vibration: Diminished in bilateral lower extremities to patella. Intact in upper extremities. Coordination: Intact finger-to- nose-finger bilaterally. Gait: Wide-based, unsteady gait with rolator.   Lab and Test Review: New results: 02/03/23: IFE: no M protein HbA1c: 9.9 B1 wnl  03/03/23: CBC significant for  chronic anemia (Hb 11.9) CMP significant for K of 3.3 (chronic), Cr 1.63 (chronic) TSH mildly elevated at 5.94  Previously reviewed results: 06/09/22: IFE: poorly defined area of restricted protein mobility (reactive with IgG and kappa) B6 wnl MMA wnl   TSH (09/01/22): 5.455 BMP (10/28/22): significant for K 3.0, glucose 382, Cr 1.95 CMP (12/23/22): K of 2.9, glucose 431, Cr 1.76, AST 47, ALT 23 CBC (10/28/22): unremarkable  Normal or unremarkable:  03/14/22: TSH: 15.21 HbA1c: 7.7 CMP significant for elevated glucose (201) and Cr (1.38) CBC significant for elevated MCV (106.3) B12 (01/07/20): 448   EMG (11/28/22): NCV & EMG Findings: Extensive electrodiagnostic evaluation of the left lower limb with additional nerve conduction studies of the left upper limb shows: Left sural and superficial peroneal/fibular sensory responses are absent. Left median, ulnar, and radial sensory responses are within normal limits. Left peroneal/fibular (EDB), tibial (AH), median (APB), and ulnar (ADM) motor responses are within normal limits. Left H reflex is absent. There is no evidence of active or  chronic motor axon loss changes affecting any of the tested muscles on needle examination. Motor unit configuration and recruitment pattern is within normal limits.   Impression: The findings above are most consistent with the following: Absent lower limb sensory responses and H reflex may be normal at this age or represent evidence of a large fiber sensory predominant neuropathy. No electrodiagnostic evidence of a left lumbosacral (L3-S1) motor radiculopathy. No electrodiagnostic evidence of a left median mononeuropathy at or distal to the wrist (ie: carpal tunnel syndrome). Screening studies for left ulnar and radial mononeuropathies are normal.   CT head wo contrast (for mental status change in setting of hyperglycemia) (10/28/22): FINDINGS: Brain: No acute territorial infarction, hemorrhage or intracranial mass. Mild atrophy. Nonenlarged ventricles.   Vascular: No hyperdense vessels.  Carotid vascular calcification.   Skull: Normal. Negative for fracture or focal lesion.   Sinuses/Orbits: Old fracture deformity medial wall left orbit. Moderate mucosal thickening in the right maxillary sinus with scattered calcific deposits.   Other: None.   IMPRESSION: No CT evidence for acute intracranial abnormality. Mild atrophy.   MRI brain wo contrast (01/01/20): CLINICAL DATA:  TIA. Transient left facial numbness and slurred speech.   EXAM: MRI HEAD WITHOUT CONTRAST   TECHNIQUE: Multiplanar, multiecho pulse sequences of the brain and surrounding structures were obtained without intravenous contrast.   COMPARISON:  Head CT 01/01/2020 and MRI 07/30/2014   FINDINGS: Brain: There is no evidence of acute infarct, intracranial hemorrhage, mass, midline shift, or extra-axial fluid collection. The ventricles and sulci are normal. T2 hyperintensities in the cerebral white matter bilaterally are unchanged from the prior MRI and nonspecific but compatible with minimal chronic small  vessel ischemic disease.   Vascular: Major intracranial vascular flow voids are preserved.   Skull and upper cervical spine: Unremarkable bone marrow signal.   Sinuses/Orbits: Remote medial left orbital fracture. Chronic right sphenoid sinusitis. Clear mastoid air cells.   Other: None.   IMPRESSION: 1. No acute intracranial abnormality. 2. Minimal chronic small vessel ischemic disease.  ASSESSMENT: This is SHAKALA BASGALL, a 72 y.o. female with pain in bilateral lower extremities. 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 She has previously failed gabapentin and continues to have increase in pain despite increasing doses of Lyrica. She does get some relief, but it does not last long enough. We discussed her worsening HbA1c today (9.9 from 7.7 a year prior) that is likely driving the worsening neuropathy. Patient understands that Lyrica  may help with neuropathy, but glucose control is the key to preventing continued worsening. Finally, she describes significant knee pain, so perhaps there is an overlapping MSK component as well.  Plan: -Will increase Lyrica to 50mg /50mg /100mg  -Patient will discuss DM management with endocrinologist -Patient will discuss bite on neck with PCP, does not look infected to me though -Will monitor RUE symptoms closely  Return to clinic in 6 months  Total time spent reviewing records, interview, history/exam, documentation, and coordination of care on day of encounter:  30 min  Jacquelyne Balint, MD

## 2023-05-01 ENCOUNTER — Ambulatory Visit: Payer: Self-pay | Admitting: Licensed Clinical Social Worker

## 2023-05-01 NOTE — Patient Outreach (Signed)
  Care Coordination   Follow Up Visit Note   05/01/2023 Name: Sue [name] MRN: [id number] DOB: 01/28/1951  Sue [name] is a 72 y.o. year old female who sees Zola Button, Grayling Congress, DO for primary care. I spoke with  [name] by phone today.  What matters to the patients health and wellness today?  Patient needs more in home care support. [name], daughter of patient , has talked with PCP about PCS application for client    Goals Addressed             This Visit's Progress    Patient needs more in home care support. [name], daughter of patient, has talked with PCP about PCS application for client       Interventions:  Spoke via phone with Guerry Minors about her needs and stauts Client has support from her daughter, Ranelle Oyster about pain issues of client Spoke with client about edema issues of client Spoke of in home care support of client. Client has in home support with home health aide as scheduled weekly Spoke of ambulation of client. She said she is not stable when walking. She has walker to use as needed. She said she does not feel stable sometimes when she stands Discussed oxygen use of client.  Discussed sleeping issues of client . She has difficulty sleeping Discussed medication procurement Discussed mood of client. Client has family support. [name], daughter of client , is very supportive of client Encouraged client or daughter, [name] to call LCSW as needed for SW support for client at [phone].        SDOH assessments and interventions completed:  Yes  SDOH Interventions Today    Flowsheet Row Most Recent Value  SDOH Interventions   Depression Interventions/Treatment  Counseling  Physical Activity Interventions Other (Comments)  [difficulty with mobility]  Stress Interventions Provide Counseling  [has stress related to managing medical needs]        Care Coordination Interventions:  Yes, provided    Interventions Today    Flowsheet Row Most Recent Value  Chronic Disease   Chronic disease during today's visit Other  [discussed client needs with client]  General Interventions   General Interventions Discussed/Reviewed General Interventions Discussed, [facility]  [discussed program support]  Exercise Interventions   Exercise Discussed/Reviewed Physical Activity  [uses a walker to help her walk]  Physical Activity Discussed/Reviewed Physical Activity Reviewed  Education Interventions   Education Provided Provided Education  Provided Verbal Education On Community Resources  Mental Health Interventions   Mental Health Discussed/Reviewed Coping Strategies  Nutrition Interventions   Nutrition Discussed/Reviewed Nutrition Discussed  Pharmacy Interventions   Pharmacy Dicussed/Reviewed Pharmacy Topics Discussed       Follow up plan: Follow up call scheduled for 06/19/23 at 2:00 PM     Encounter Outcome:  Pt. Visit Completed   Kelton Pillar.Levon Penning MSW, LCSW Licensed Visual merchandiser [facility] Care Management [phone]

## 2023-05-01 NOTE — Patient Instructions (Signed)
Visit Information  Thank you for taking time to visit with me today. Please don't hesitate to contact me if I can be of assistance to you.   Following are the goals we discussed today:   Goals Addressed             This Visit's Progress    Patient needs more in home care support. [name], daughter of patient, has talked with PCP about PCS application for client       Interventions:  Spoke via phone with Guerry Minors about her needs and stauts Client has support from her daughter, Ranelle Oyster about pain issues of client Spoke with client about edema issues of client Spoke of in home care support of client. Client has in home support with home health aide as scheduled weekly Spoke of ambulation of client. She said she is not stable when walking. She has walker to use as needed. She said she does not feel stable sometimes when she stands Discussed oxygen use of client.  Discussed sleeping issues of client . She has difficulty sleeping Discussed medication procurement Discussed mood of client. Client has family support. [name], daughter of client , is very supportive of client Encouraged client or daughter, [name] to call LCSW as needed for SW support for client at [phone].        Our next appointment is by telephone on 06/19/23 at 2:00 PM   Please call the care guide team at [phone] if you need to cancel or reschedule your appointment.   If you are experiencing a Mental Health or Behavioral Health Crisis or need someone to talk to, please go to [facility] [address], [place] [phone])   The patient verbalized understanding of instructions, educational materials, and care plan provided today and DECLINED offer to receive copy of patient instructions, educational materials, and care plan.   The patient has been provided with contact information for the care management team and has been advised to  call with any health related questions or concerns.   Kelton Pillar.Vishwa Dais MSW, LCSW Licensed Visual merchandiser [facility] Care Management [phone]

## 2023-05-03 ENCOUNTER — Ambulatory Visit: Payer: Medicare PPO | Attending: Cardiology | Admitting: Cardiology

## 2023-05-05 ENCOUNTER — Encounter: Payer: Self-pay | Admitting: Neurology

## 2023-05-05 ENCOUNTER — Ambulatory Visit (INDEPENDENT_AMBULATORY_CARE_PROVIDER_SITE_OTHER): Payer: Medicare PPO | Admitting: Neurology

## 2023-05-05 VITALS — BP 155/75 | HR 99 | Ht 63.0 in | Wt 269.0 lb

## 2023-05-05 DIAGNOSIS — G8929 Other chronic pain: Secondary | ICD-10-CM

## 2023-05-05 DIAGNOSIS — E1142 Type 2 diabetes mellitus with diabetic polyneuropathy: Secondary | ICD-10-CM

## 2023-05-05 DIAGNOSIS — R269 Unspecified abnormalities of gait and mobility: Secondary | ICD-10-CM

## 2023-05-05 DIAGNOSIS — M25562 Pain in left knee: Secondary | ICD-10-CM | POA: Diagnosis not present

## 2023-05-05 DIAGNOSIS — R209 Unspecified disturbances of skin sensation: Secondary | ICD-10-CM

## 2023-05-05 DIAGNOSIS — M25561 Pain in right knee: Secondary | ICD-10-CM

## 2023-05-05 MED ORDER — PREGABALIN 50 MG PO CAPS
ORAL_CAPSULE | ORAL | 5 refills | Status: DC
Start: 2023-05-05 — End: 2023-11-13

## 2023-05-05 NOTE — Patient Instructions (Signed)
Plan for today: -Will increase Lyrica to 50mg /50mg /100mg  (now take 2 capsules in the evening) -Please discuss DM management with endocrinologist -Please discuss bite on neck with PCP, but it does not look infected to me though -Will monitor RUE symptoms closely  Return to clinic in 6 months or sooner if needed.  Please let me know if you have any questions or concerns in the meantime.  The physicians and staff at [facility] are committed to providing excellent care. You may receive a survey requesting feedback about your experience at our office. We strive to receive "very good" responses to the survey questions. If you feel that your experience would prevent you from giving the office a "very good " response, please contact our office to try to remedy the situation. We may be reached at [phone]. Thank you for taking the time out of your busy day to complete the survey.  Jacquelyne Balint, MD [facility]  Preventing Falls at [facility] are common, often dreaded events in the lives of older people. Aside from the obvious injuries and even death that may result, fall can cause wide-ranging consequences including loss of independence, mental decline, decreased activity and mobility. Younger people are also at risk of falling, especially those with chronic illnesses and fatigue.  Ways to reduce risk for falling Examine diet and medications. Warm foods and alcohol dilate blood vessels, which can lead to dizziness when standing. Sleep aids, antidepressants and pain medications can also increase the likelihood of a fall.  Get a vision exam. Poor vision, cataracts and glaucoma increase the chances of falling.  Check foot gear. Shoes should fit snugly and have a sturdy, nonskid sole and a broad, low heel  Participate in a physician-approved exercise program to build and maintain muscle strength and improve balance and coordination. Programs that use ankle weights or stretch bands are  excellent for muscle-strengthening. Water aerobics programs and low-impact Tai Chi programs have also been shown to improve balance and coordination.  Increase vitamin D intake. Vitamin D improves muscle strength and increases the amount of calcium the body is able to absorb and deposit in bones.  How to prevent falls from common hazards Floors - Remove all loose wires, cords, and throw rugs. Minimize clutter. Make sure rugs are anchored and smooth. Keep furniture in its usual place.  Chairs -- Use chairs with straight backs, armrests and firm seats. Add firm cushions to existing pieces to add height.  Bathroom - Install grab bars and non-skid tape in the tub or shower. Use a bathtub transfer bench or a shower chair with a back support Use an elevated toilet seat and/or safety rails to assist standing from a low surface. Do not use towel racks or bathroom tissue holders to help you stand.  Lighting - Make sure halls, stairways, and entrances are well-lit. Install a night light in your bathroom or hallway. Make sure there is a light switch at the top and bottom of the staircase. Turn lights on if you get up in the middle of the night. Make sure lamps or light switches are within reach of the bed if you have to get up during the night.  Kitchen - Install non-skid rubber mats near the sink and stove. Clean spills immediately. Store frequently used utensils, pots, pans between waist and eye level. This helps prevent reaching and bending. Sit when getting things out of lower cupboards.  Living room/ Bedrooms - Place furniture with wide spaces in between, giving enough room  to move around. Establish a route through the living room that gives you something to hold onto as you walk.  Stairs - Make sure treads, rails, and rugs are secure. Install a rail on both sides of the stairs. If stairs are a threat, it might be helpful to arrange most of your activities on the lower level to reduce the number of times  you must climb the stairs.  Entrances and doorways - Install metal handles on the walls adjacent to the doorknobs of all doors to make it more secure as you travel through the doorway.  Tips for maintaining balance Keep at least one hand free at all times. Try using a backpack or fanny pack to hold things rather than carrying them in your hands. Never carry objects in both hands when walking as this interferes with keeping your balance.  Attempt to swing both arms from front to back while walking. This might require a conscious effort if Parkinson's disease has diminished your movement. It will, however, help you to maintain balance and posture, and reduce fatigue.  Consciously lift your feet off of the ground when walking. Shuffling and dragging of the feet is a common culprit in losing your balance.  When trying to navigate turns, use a "U" technique of facing forward and making a wide turn, rather than pivoting sharply.  Try to stand with your feet shoulder-length apart. When your feet are close together for any length of time, you increase your risk of losing your balance and falling.  Do one thing at a time. Don't try to walk and accomplish another task, such as reading or looking around. The decrease in your automatic reflexes complicates motor function, so the less distraction, the better.  Do not wear rubber or gripping soled shoes, they might "catch" on the floor and cause tripping.  Move slowly when changing positions. Use deliberate, concentrated movements and, if needed, use a grab bar or walking aid. Count 15 seconds between each movement. For example, when rising from a seated position, wait 15 seconds after standing to begin walking.  If balance is a continuous problem, you might want to consider a walking aid such as a cane, walking stick, or walker. Once you've mastered walking with help, you might be ready to try it on your own again.

## 2023-05-09 ENCOUNTER — Other Ambulatory Visit: Payer: Self-pay | Admitting: Internal Medicine

## 2023-05-10 ENCOUNTER — Other Ambulatory Visit: Payer: Self-pay | Admitting: Behavioral Health

## 2023-05-10 DIAGNOSIS — F331 Major depressive disorder, recurrent, moderate: Secondary | ICD-10-CM

## 2023-05-10 DIAGNOSIS — F3131 Bipolar disorder, current episode depressed, mild: Secondary | ICD-10-CM

## 2023-05-15 ENCOUNTER — Other Ambulatory Visit: Payer: Self-pay | Admitting: Behavioral Health

## 2023-05-15 DIAGNOSIS — F99 Mental disorder, not otherwise specified: Secondary | ICD-10-CM

## 2023-05-16 ENCOUNTER — Other Ambulatory Visit (HOSPITAL_COMMUNITY): Payer: Self-pay

## 2023-05-16 ENCOUNTER — Telehealth: Payer: Self-pay | Admitting: Pharmacy Technician

## 2023-05-16 NOTE — Telephone Encounter (Signed)
Pharmacy Patient Advocate Encounter   Received notification from Fax that prior authorization for PREGABALIN 75MG  is required/requested.   Insurance verification completed.   The patient is insured through [employer] .   Per test claim: APPROVED from 9.3.24 to 12.31.24

## 2023-05-25 ENCOUNTER — Other Ambulatory Visit: Payer: Medicare PPO | Admitting: Pharmacist

## 2023-05-30 DIAGNOSIS — I129 Hypertensive chronic kidney disease with stage 1 through stage 4 chronic kidney disease, or unspecified chronic kidney disease: Secondary | ICD-10-CM | POA: Diagnosis not present

## 2023-05-30 DIAGNOSIS — N183 Chronic kidney disease, stage 3 unspecified: Secondary | ICD-10-CM | POA: Diagnosis not present

## 2023-05-30 DIAGNOSIS — E1122 Type 2 diabetes mellitus with diabetic chronic kidney disease: Secondary | ICD-10-CM | POA: Diagnosis not present

## 2023-05-30 DIAGNOSIS — E785 Hyperlipidemia, unspecified: Secondary | ICD-10-CM | POA: Diagnosis not present

## 2023-05-31 LAB — LAB REPORT - SCANNED: EGFR: 36

## 2023-06-19 ENCOUNTER — Encounter: Payer: Medicare PPO | Admitting: Licensed Clinical Social Worker

## 2023-06-30 ENCOUNTER — Ambulatory Visit: Payer: Medicare PPO | Admitting: Family Medicine

## 2023-07-04 ENCOUNTER — Ambulatory Visit: Payer: Self-pay | Admitting: Licensed Clinical Social Worker

## 2023-07-04 NOTE — Patient Outreach (Signed)
Care Coordination   Follow Up Visit Note   07/04/2023 Name: Sue [name] MRN: [id number] DOB: Jul 22, 1951  Sue [name] is a 72 y.o. year old female who sees Zola Button, Grayling Congress, DO for primary care. I spoke with  Sue [name], daughter of client via phone today.  What matters to the patients health and wellness today?  Patient needs more in home care support. Sue [name], daughter of patient, has talked with PCP about PCS application for client    Goals Addressed             This Visit's Progress    Patient needs more in home care support. Sue [name], daughter of patient, has talked with PCP about PCS application for client       Interventions:  Spoke via phone with Sue [name], daughter of client, about client needs Discussed upcoming medical appointments for client. Client is trying to set up appointment with endocrinologist to help her better manage her sugar levels Client has support from her daughter, Sue [name] about pain issues of client. Client has neuropathy She has pain in her lower legs and feet Spoke about edema issues of client Spoke of in home care support of client. Client has some in home support with home health aide as scheduled weekly Spoke of ambulation of client. Client uses a rollator walker to help her walk Discussed oxygen use of client. Client uses oxygen support 24/7. She has needed equipment Discussed sleeping issues of client . She has difficulty sleeping Discussed medication procurement Discussed mood of client. Client has family support. Sue [name], daughter of client , said she thinks client mood is stable at this time Discussed client relaxation techniques and hobbies Discussed transport support of client. Sue [name] transports client to and from client appointments Encouraged client or daughter, Sue [name] to call LCSW as needed for SW support for client at [phone].          SDOH  assessments and interventions completed:  Yes  SDOH Interventions Today    Flowsheet Row Most Recent Value  SDOH Interventions   Depression Interventions/Treatment  Counseling  Physical Activity Interventions Other (Comments)  [uses a rollator walker to help her walk]  Stress Interventions Other (Comment)  [has stress in managing pain issues. has mobility challenges]        Care Coordination Interventions:  Yes, provided   Interventions Today    Flowsheet Row Most Recent Value  Chronic Disease   Chronic disease during today's visit Other  [spoke with Sue [name], daughter of client, about client needs]  General Interventions   General Interventions Discussed/Reviewed General Interventions Discussed, Community Resources  Exercise Interventions   Exercise Discussed/Reviewed Physical Activity  Education Interventions   Education Provided Provided Education  Provided Verbal Education On Community Resources  Mental Health Interventions   Mental Health Discussed/Reviewed Coping Strategies  [no mood issues noted]  Nutrition Interventions   Nutrition Discussed/Reviewed Nutrition Discussed  Pharmacy Interventions   Pharmacy Dicussed/Reviewed Pharmacy Topics Discussed  Safety Interventions   Safety Discussed/Reviewed Fall Risk        Follow up plan: Follow up call scheduled for 08/14/23 at 3:00 PM     Encounter Outcome:  Patient Visit Completed   Kelton Pillar.Chere Babson MSW, LCSW Licensed Visual merchandiser [facility] Care Management [phone]

## 2023-07-04 NOTE — Patient Instructions (Signed)
Visit Information  Thank you for taking time to visit with me today. Please don't hesitate to contact me if I can be of assistance to you.   Following are the goals we discussed today:   Goals Addressed             This Visit's Progress    Patient needs more in home care support. [name], daughter of patient, has talked with PCP about PCS application for client       Interventions:  Spoke via phone with [name], daughter of client, about client needs Discussed upcoming medical appointments for client. Client is trying to set up appointment with endocrinologist to help her better manage her sugar levels Client has support from her daughter, Ranelle Oyster about pain issues of client. Client has neuropathy She has pain in her lower legs and feet Spoke about edema issues of client Spoke of in home care support of client. Client has some in home support with home health aide as scheduled weekly Spoke of ambulation of client. Client uses a rollator walker to help her walk Discussed oxygen use of client. Client uses oxygen support 24/7. She has needed equipment Discussed sleeping issues of client . She has difficulty sleeping Discussed medication procurement Discussed mood of client. Client has family support. [name], daughter of client , said she thinks client mood is stable at this time Discussed client relaxation techniques and hobbies Discussed transport support of client. [name] transports client to and from client appointments Encouraged client or daughter, [name] to call LCSW as needed for SW support for client at [phone].          Our next appointment is by telephone on 08/14/23 at 3:00 PM   Please call the care guide team at [phone] if you need to cancel or reschedule your appointment.   If you are experiencing a Mental Health or Behavioral Health Crisis or need someone to talk to, please go to [facility] [address] [place] [phone])   The patient / [name], daughter, verbalized understanding of instructions, educational materials, and care plan provided today and DECLINED offer to receive copy of patient instructions, educational materials, and care plan.   The patient / [name], daughter, has been provided with contact information for the care management team and has been advised to call with any health related questions or concerns.   Kelton Pillar.Joliyah Lippens MSW, LCSW Licensed Visual merchandiser [facility] Care Management [phone]

## 2023-07-10 ENCOUNTER — Other Ambulatory Visit: Payer: Self-pay | Admitting: Internal Medicine

## 2023-07-11 ENCOUNTER — Other Ambulatory Visit: Payer: Self-pay | Admitting: Family Medicine

## 2023-07-11 DIAGNOSIS — R0609 Other forms of dyspnea: Secondary | ICD-10-CM

## 2023-07-11 DIAGNOSIS — R600 Localized edema: Secondary | ICD-10-CM

## 2023-07-17 ENCOUNTER — Other Ambulatory Visit: Payer: Self-pay | Admitting: Internal Medicine

## 2023-07-17 ENCOUNTER — Other Ambulatory Visit: Payer: Self-pay | Admitting: Family Medicine

## 2023-07-17 DIAGNOSIS — E876 Hypokalemia: Secondary | ICD-10-CM

## 2023-07-19 ENCOUNTER — Other Ambulatory Visit: Payer: Self-pay | Admitting: Internal Medicine

## 2023-07-19 ENCOUNTER — Telehealth: Payer: Self-pay | Admitting: Family Medicine

## 2023-07-19 MED ORDER — TOUJEO SOLOSTAR 300 UNIT/ML ~~LOC~~ SOPN: 64.0000 [IU] | PEN_INJECTOR | Freq: Every day | SUBCUTANEOUS | 0 refills | Status: DC

## 2023-07-19 NOTE — Addendum Note (Signed)
Addended by: Scarlette Shorts on: 07/19/2023 11:29 AM   Modules accepted: Orders

## 2023-07-19 NOTE — Telephone Encounter (Signed)
Prescription Request  07/19/2023  Is this a "Controlled Substance" medicine? No  LOV: 03/03/2023  What is the name of the medication or equipment? insulin glargine, 1 Unit Dial, (TOUJEO SOLOSTAR) 300 UNIT/ML Solostar Pen  Have you contacted your pharmacy to request a refill? Yes   Which pharmacy would you like this sent to?  [facility] [address] [place], [place] - [address] [place] [address] Phone: [phone] Fax: [phone]   Patient notified that their request is being sent to the clinical staff for review and that they should receive a response within 2 business days.   Please advise at [facility] [phone]

## 2023-07-24 ENCOUNTER — Other Ambulatory Visit: Payer: Self-pay | Admitting: Family Medicine

## 2023-07-24 DIAGNOSIS — E1165 Type 2 diabetes mellitus with hyperglycemia: Secondary | ICD-10-CM

## 2023-07-27 ENCOUNTER — Other Ambulatory Visit: Payer: Self-pay | Admitting: Family Medicine

## 2023-07-27 DIAGNOSIS — R0609 Other forms of dyspnea: Secondary | ICD-10-CM

## 2023-07-27 DIAGNOSIS — R600 Localized edema: Secondary | ICD-10-CM

## 2023-08-11 ENCOUNTER — Other Ambulatory Visit: Payer: Self-pay | Admitting: Internal Medicine

## 2023-08-14 ENCOUNTER — Ambulatory Visit: Payer: Self-pay | Admitting: Licensed Clinical Social Worker

## 2023-08-14 NOTE — Patient Outreach (Signed)
  Care Coordination   08/14/2023 Name: Sue [name] MRN: [id number] DOB: Oct 30, 1950   Care Coordination Outreach Attempts:  An unsuccessful telephone outreach was attempted today to offer the patient information about available care coordination services.  Follow Up Plan:  Additional outreach attempts will be made to offer the patient care coordination information and services.   Encounter Outcome:  No Answer   Care Coordination Interventions:  No, not indicated    Kelton Pillar.Naydeline Morace MSW, LCSW Licensed Visual merchandiser [facility] Care Management [phone]

## 2023-08-17 ENCOUNTER — Other Ambulatory Visit: Payer: Self-pay | Admitting: Internal Medicine

## 2023-08-21 ENCOUNTER — Ambulatory Visit: Payer: Medicare PPO | Admitting: Internal Medicine

## 2023-08-21 ENCOUNTER — Encounter: Payer: Self-pay | Admitting: Internal Medicine

## 2023-08-21 VITALS — BP 120/76 | HR 95 | Ht 63.0 in | Wt 268.0 lb

## 2023-08-21 DIAGNOSIS — E1122 Type 2 diabetes mellitus with diabetic chronic kidney disease: Secondary | ICD-10-CM

## 2023-08-21 DIAGNOSIS — Z7985 Long-term (current) use of injectable non-insulin antidiabetic drugs: Secondary | ICD-10-CM

## 2023-08-21 DIAGNOSIS — Z7984 Long term (current) use of oral hypoglycemic drugs: Secondary | ICD-10-CM | POA: Diagnosis not present

## 2023-08-21 DIAGNOSIS — E1165 Type 2 diabetes mellitus with hyperglycemia: Secondary | ICD-10-CM | POA: Diagnosis not present

## 2023-08-21 DIAGNOSIS — E1142 Type 2 diabetes mellitus with diabetic polyneuropathy: Secondary | ICD-10-CM | POA: Diagnosis not present

## 2023-08-21 DIAGNOSIS — N1832 Chronic kidney disease, stage 3b: Secondary | ICD-10-CM | POA: Diagnosis not present

## 2023-08-21 DIAGNOSIS — Z794 Long term (current) use of insulin: Secondary | ICD-10-CM | POA: Diagnosis not present

## 2023-08-21 DIAGNOSIS — E039 Hypothyroidism, unspecified: Secondary | ICD-10-CM | POA: Diagnosis not present

## 2023-08-21 LAB — POCT GLYCOSYLATED HEMOGLOBIN (HGB A1C): Hemoglobin A1C: 13.5 % — AB (ref 4.0–5.6)

## 2023-08-21 LAB — POCT GLUCOSE (DEVICE FOR HOME USE): POC Glucose: 385 mg/dL — AB (ref 70–99)

## 2023-08-21 MED ORDER — TOUJEO SOLOSTAR 300 UNIT/ML ~~LOC~~ SOPN: 70.0000 [IU] | PEN_INJECTOR | Freq: Every day | SUBCUTANEOUS | 3 refills | Status: DC

## 2023-08-21 MED ORDER — NOVOLOG FLEXPEN 100 UNIT/ML ~~LOC~~ SOPN: PEN_INJECTOR | SUBCUTANEOUS | 3 refills | Status: DC

## 2023-08-21 MED ORDER — EMPAGLIFLOZIN 10 MG PO TABS: 10.0000 mg | ORAL_TABLET | Freq: Every day | ORAL | 3 refills | Status: DC

## 2023-08-21 MED ORDER — TRULICITY 3 MG/0.5ML ~~LOC~~ SOAJ: 3.0000 mg | SUBCUTANEOUS | 3 refills | Status: DC

## 2023-08-21 NOTE — Progress Notes (Unsigned)
Name: Sue [name]  MRN/ DOB: [id number], 12-29-1950   Age/ Sex: 72 y.o., female    PCP: Zola Button, Grayling Congress, DO   Reason for Endocrinology Evaluation: Type 2 Diabetes Mellitus/ Hypothyroidism     Date of Initial Endocrinology Visit: 05/18/2021    PATIENT IDENTIFIER: Sue [name] is a 72 y.o. female with a past medical history of T2DM, HTN, COPD and Hypothyroidism . The patient presented for initial endocrinology clinic visit on 05/18/2021 for consultative assistance with her diabetes management.    HPI: Sue [name] is accompanied by her daughter Sue [name]    Diagnosed with DM years ago  Prior Medications tried/Intolerance: Restart metformin due to low GFR, started Trulicity 05/2021 Hemoglobin A1c has ranged from 6.7% in 2020, peaking at 10.6% in 2019.   On her initial visit to our clinic she had an A1c of 7.5%, she was on metformin, glipizide, and Lantus.  We stopped metformin due to low GFR, as well as glipizide.  We continued basal insulin and started Trulicity   THYROID HISTORY: She was diagnosed with hypothyroidism for years. Has been on LT-4 replacement  No proir radiation exposure or sx     SUBJECTIVE:   During the last visit (04/29/2022): A1c 7.7%   Today (08/21/23): [name] is here for a follow up on diabetes and thyroid management.  She has NOT been to our clinic in 16 months. She checks her blood sugars 1 times daily. The patient has not had hypoglycemic episodes since the last clinic visit    Denies nausea or  vomiting  Denies constipation or diarrhea  She eats 2 meals a day lunch and dinner    HOME DIABETES REGIMEN: Toujeo 64 units daily Trulicity 3 mg weekly Levothyroxine 88 mcg daily      Statin: no  ACE-I/ARB: yes Prior Diabetic Education: no   METER DOWNLOAD SUMMARY: Did not bring     DIABETIC COMPLICATIONS: Microvascular complications:  CKD III, neuropathy Denies: retinopathy  Last eye exam: Completed 2024  Macrovascular  complications:   Denies: CAD, PVD, CVA   PAST HISTORY: Past Medical History:  Past Medical History:  Diagnosis Date   Allergic rhinitis    Depression    Diabetes mellitus type 2, uncontrolled    Emphysema of lung (HCC)    3L home O2   GERD (gastroesophageal reflux disease)    Hypertension    Hypothyroidism    Obesity, morbid, BMI 50 or higher (HCC)    Stroke (HCC) 2016   TIA    Urine incontinence    Past Surgical History:  Past Surgical History:  Procedure Laterality Date   ABDOMINAL HYSTERECTOMY     CESAREAN SECTION     COLONOSCOPY N/A 08/19/2019   Procedure: COLONOSCOPY;  Surgeon: Vida Rigger, MD;  Location: [facility];  Service: Endoscopy;  Laterality: N/A;   COLONOSCOPY WITH PROPOFOL N/A 08/05/2019   Procedure: COLONOSCOPY WITH PROPOFOL;  Surgeon: Charlott Rakes, MD;  Location: [facility];  Service: Gastroenterology;  Laterality: N/A;   COLONOSCOPY WITH PROPOFOL N/A 11/19/2019   Procedure: COLONOSCOPY WITH PROPOFOL;  Surgeon: Bernette Redbird, MD;  Location: [facility];  Service: Endoscopy;  Laterality: N/A;  Unprepped   COLONOSCOPY WITH PROPOFOL N/A 11/22/2019   Procedure: COLONOSCOPY WITH PROPOFOL;  Surgeon: Bernette Redbird, MD;  Location: [facility];  Service: Endoscopy;  Laterality: N/A;   COLONOSCOPY WITH PROPOFOL N/A 11/23/2019   Procedure: COLONOSCOPY WITH PROPOFOL;  Surgeon: Sue Salen, MD;  Location: [facility];  Service: Gastroenterology;  Laterality: N/A;  COLONOSCOPY WITH PROPOFOL N/A 11/29/2019   Procedure: COLONOSCOPY WITH PROPOFOL;  Surgeon: Charlott Rakes, MD;  Location: [facility];  Service: Endoscopy;  Laterality: N/A;   ENTEROSCOPY N/A 11/24/2019   Procedure: ENTEROSCOPY;  Surgeon: Sue Salen, MD;  Location: [facility];  Service: Gastroenterology;  Laterality: N/A;   ENTEROSCOPY N/A 11/27/2019   Procedure: ENTEROSCOPY;  Surgeon: Charlott Rakes, MD;  Location: [facility];  Service: Endoscopy;  Laterality: N/A;    ESOPHAGOGASTRODUODENOSCOPY N/A 11/27/2019   Procedure: ESOPHAGOGASTRODUODENOSCOPY (EGD);  Surgeon: Charlott Rakes, MD;  Location: [facility];  Service: Endoscopy;  Laterality: N/A;   ESOPHAGOGASTRODUODENOSCOPY (EGD) WITH PROPOFOL N/A 11/24/2019   Procedure: ESOPHAGOGASTRODUODENOSCOPY (EGD) WITH PROPOFOL;  Surgeon: Sue Salen, MD;  Location: [facility];  Service: Gastroenterology;  Laterality: N/A;  PUSH enteroscopy   GIVENS CAPSULE [name] N/A 11/19/2019   Procedure: GIVENS CAPSULE [name];  Surgeon: Bernette Redbird, MD;  Location: [facility];  Service: Endoscopy;  Laterality: N/A;  To be performed immediately following colonoscopy   GIVENS CAPSULE [name] N/A 11/24/2019   Procedure: GIVENS CAPSULE [name];  Surgeon: Sue Salen, MD;  Location: [facility];  Service: Gastroenterology;  Laterality: N/A;   GIVENS CAPSULE [name] N/A 11/28/2019   Procedure: GIVENS CAPSULE [name];  Surgeon: Charlott Rakes, MD;  Location: [facility];  Service: Endoscopy;  Laterality: N/A;   HEMOSTASIS CLIP PLACEMENT  11/19/2019   Procedure: HEMOSTASIS CLIP PLACEMENT;  Surgeon: Bernette Redbird, MD;  Location: [facility];  Service: Endoscopy;;   HEMOSTASIS CLIP PLACEMENT  11/22/2019   Procedure: HEMOSTASIS CLIP PLACEMENT;  Surgeon: Bernette Redbird, MD;  Location: [facility];  Service: Endoscopy;;   HOT HEMOSTASIS N/A 11/24/2019   Procedure: HOT HEMOSTASIS (ARGON PLASMA COAGULATION/BICAP);  Surgeon: Sue Salen, MD;  Location: [facility];  Service: Gastroenterology;  Laterality: N/A;   HOT HEMOSTASIS N/A 11/27/2019   Procedure: HOT HEMOSTASIS (ARGON PLASMA COAGULATION/BICAP);  Surgeon: Charlott Rakes, MD;  Location: [facility];  Service: Endoscopy;  Laterality: N/A;   SUBMUCOSAL TATTOO INJECTION  11/19/2019   Procedure: SUBMUCOSAL TATTOO INJECTION;  Surgeon: Bernette Redbird, MD;  Location: [facility];  Service: Endoscopy;;    Social History:  reports that she quit smoking about 11 years ago. Her smoking use  included cigarettes. She started smoking about 51 years ago. She has a 40 pack-year smoking history. She has never used smokeless tobacco. She reports that she does not currently use alcohol. She reports that she does not use drugs. Family History:  Family History  Problem Relation Age of Onset   Heart disease Father        MVP and Pics Valve   Hypertension Father    Depression Father        Institutionalized x's 2 years   Bipolar disorder Father    Hypertension Sister    Diabetes Sister    Hyperlipidemia Sister    Heart disease Sister 37       MI   Heart disease Brother    Hypertension Brother    Schizophrenia Paternal Aunt    Depression Paternal Aunt    Anxiety disorder Paternal Aunt    Heart disease Paternal Aunt    Schizophrenia Paternal Aunt    Heart disease Paternal Uncle    Heart disease Paternal Grandmother    Asthma Son    Asthma Son      HOME MEDICATIONS: Allergies as of 08/21/2023       Reactions   Hydrocodone Other (See Comments)   Constipation and hallucinations   Norvasc [amlodipine Besylate] Swelling, Other (See Comments)  Marked swelling of the limbs   Tizanidine Other (See Comments)   After the 3rd dose, the patient's mouth began to feel numb and she felt like she was a having a "hot flash"        Medication List        Accurate as of August 21, 2023  1:56 PM. If you have any questions, ask your nurse or doctor.          Accu-Chek Guide test strip Generic drug: glucose blood USE 1 STRIP TO CHECK GLUCOSE 4 TIMES DAILY   Accu-Chek Guide w/Device Kit USE   TO CHECK GLUCOSE UP TO 4 TIMES DAILY AS DIRECTED   Accu-Chek Softclix Lancets lancets USE 1  TO CHECK GLUCOSE 4 TIMES DAILY   albuterol 108 (90 Base) MCG/ACT inhaler Commonly known as: VENTOLIN HFA Inhale 1-2 puffs into the lungs every 6 (six) hours as needed for wheezing or shortness of breath.   allopurinol 100 MG tablet Commonly known as: ZYLOPRIM Take 100 mg by mouth daily as  needed (for gout flareups).   amLODipine 5 MG tablet Commonly known as: NORVASC Take by mouth.   aspirin EC 81 MG tablet Take by mouth.   Belsomra 15 MG Tabs Generic drug: Suvorexant Take 1 tablet (15 mg total) by mouth at bedtime.   busPIRone 15 MG tablet Commonly known as: BUSPAR Take 1 tablet (15 mg total) by mouth 2 (two) times daily.   diclofenac Sodium 1 % Gel Commonly known as: Voltaren Arthritis Pain Apply 4 g topically 4 (four) times daily.   famotidine 20 MG tablet Commonly known as: PEPCID Take 20 mg by mouth daily as needed for heartburn or indigestion.   fluticasone 50 MCG/ACT nasal spray Commonly known as: FLONASE 2 sprays.   gabapentin 100 MG capsule Commonly known as: NEURONTIN Take by mouth.   ibuprofen 800 MG tablet Commonly known as: ADVIL Take by mouth.   Insulin Pen Needle 29G X Misc 1 Device by Does not apply route daily in the afternoon.   ketorolac 0.5 % ophthalmic solution Commonly known as: ACULAR Place one drop into the left eye 4 (four) times daily.   levothyroxine 100 MCG tablet Commonly known as: SYNTHROID Take 1 tablet (100 mcg total) by mouth daily before breakfast.   LORazepam 2 MG tablet Commonly known as: ATIVAN Take by mouth.   LORazepam 1 MG tablet Commonly known as: ATIVAN TAKE 1/2 TO 1 AND 1/2 TABLETS AT BEDTIME AND AN ADDITIONAL 1/2 TAB ONCE A DAY AS NEEDED FOR AXIETY   Magnesium 400 MG Caps Take 1 capsule by mouth daily.   metoprolol succinate 50 MG 24 hr tablet Commonly known as: TOPROL-XL Take 2 tablets (100 mg total) by mouth daily. Take with or immediately following a meal   moxifloxacin 0.5 % ophthalmic solution Commonly known as: VIGAMOX Place one drop into the left eye 4 (four) times daily.   OXYGEN Inhale 3-4 L/min into the lungs continuous.   potassium chloride SA 20 MEQ tablet Commonly known as: KLOR-CON M Take 1 tablet (20 mEq total) by mouth daily.   prednisoLONE acetate 1 % ophthalmic  suspension Commonly known as: PRED FORTE Place one drop into the left eye 4 (four) times daily.   pregabalin 50 MG capsule Commonly known as: Lyrica Take 50 mg in the morning (1 capsule), take 50 mg midday (1 capsule), and take 100 mg in the evening (2 capsules)   QUEtiapine 200 MG tablet Commonly known as: SEROQUEL Take  1 tablet (200 mg total) by mouth at bedtime.   rosuvastatin 10 MG tablet Commonly known as: Crestor Take 1 tablet (10 mg total) by mouth daily. For heart health and to lower cholesterol.   torsemide 20 MG tablet Commonly known as: DEMADEX Take 3 tablets (60 mg total) by mouth daily.   Toujeo SoloStar 300 UNIT/ML Solostar Pen Generic drug: insulin glargine (1 Unit Dial) INJECT 64 UNITS INTO THE SKIN DAILY IN THE AFTERNOON**APPOINTMENT NEEDED FOR FURTHER REFILLS**   Trulicity 3 MG/0.5ML Soaj Generic drug: Dulaglutide INJECT  3 MG UNDER THE SKIN ONCE A WEEK AS DIRECTED   zolpidem 10 MG tablet Commonly known as: AMBIEN Take by mouth.   zolpidem 12.5 MG CR tablet Commonly known as: AMBIEN CR TAKE 1 TABLET BY MOUTH AT BEDTIME AS NEEDED FOR SLEEP         ALLERGIES: Allergies  Allergen Reactions   Hydrocodone Other (See Comments)    Constipation and hallucinations   Norvasc [Amlodipine Besylate] Swelling and Other (See Comments)    Marked swelling of the limbs   Tizanidine Other (See Comments)    After the 3rd dose, the patient's mouth began to feel numb and she felt like she was a having a "hot flash"         OBJECTIVE:   VITAL SIGNS: BP 120/76 (BP Location: Left Arm, Patient Position: Sitting, Cuff Size: Large)   Pulse 95   Ht 5\' 3"  (1.6 m)   Wt 268 lb (121.6 kg)   SpO2 93%   BMI 47.47 kg/m    PHYSICAL EXAM:  General: Pt appears well and is in NAD Patient with nasal cannula  Lungs: Clear with good BS bilat   Heart: RRR  Extremities: Trace edema   Neuro: MS is good with appropriate affect, pt is alert and Ox3   DM Foot Exam     DATA REVIEWED:  Lab Results  Component Value Date   HGBA1C 9.9 (H) 02/03/2023   HGBA1C 7.7 (H) 03/14/2022   HGBA1C 7.5 (H) 04/05/2021     Latest Reference Range & Units 03/03/23 09:24  Sodium 135 - 145 mEq/L 139  Potassium 3.5 - 5.1 mEq/L 3.3 (L)  Chloride 96 - 112 mEq/L 99  CO2 19 - 32 mEq/L 31  Glucose 70 - 99 mg/dL 347 (H)  BUN 6 - 23 mg/dL 13  Creatinine 4.25 - 9.56 mg/dL 3.87 (H)  Calcium 8.4 - 10.5 mg/dL 8.3 (L)  Alkaline Phosphatase 39 - 117 U/L 111  Albumin 3.5 - 5.2 g/dL 3.3 (L)  AST 0 - 37 U/L 22  ALT 0 - 35 U/L 12  Total Protein 6.0 - 8.3 g/dL 8.0  Total Bilirubin 0.2 - 1.2 mg/dL 0.6  GFR >56.43 mL/min 31.73 (L)    Latest Reference Range & Units 03/03/23 09:24  Total CHOL/HDL Ratio  4  Cholesterol 0 - 200 mg/dL 329  HDL Cholesterol >51.88 mg/dL 41.66 (L)  LDL (calc) 0 - 99 mg/dL 78  NonHDL  06.30  Triglycerides 0.0 - 149.0 mg/dL 16.0  VLDL 0.0 - 10.9 mg/dL 32.3     Latest Reference Range & Units 03/03/23 09:24  TSH 0.35 - 5.50 uIU/mL 5.94 (H)  (H): Data is abnormally high    ASSESSMENT / PLAN / RECOMMENDATIONS:   1) Type 2 Diabetes Mellitus, Poorly controlled, With CKD III, and neuropathic  complications - Most recent A1c of 13.5 %. Goal A1c < 7.0 %.    -Patient with severe hyperglycemia, but she assures me  with insulin intake?  Patient states that she takes Toujeo 64 units in the morning, and as the day goes on she will use it again based on a sliding scale? -I emphasized the importance of using sliding scale for prandial insulin rather than with Toujeo.  I did explain to the patient that Toujeo is a standing dose -I will restart her on NovoLog that she will take with brunch and supper -She was advised to check glucose twice daily before meals and use correction scale as needed - Off Metformin due to low GFR  - She is allergic to CGM adhesive I would like to avoid CGM technology -I have also recommended starting SGLT2 inhibitors, cautioned against  genital infections  MEDICATIONS: Start Jardiance 10 mg daily Increase  Toujeo 70 units daily  Continue Trulicity 3 mg ONCE weekly  NovoLog 10 units with each meal CF: NovoLog (BG -130/30) TIDQAC     EDUCATION / INSTRUCTIONS: BG monitoring instructions: Patient is instructed to check her blood sugars 1 times a day, fasting . Call [facility] if: BG persistently < 70  I reviewed the Rule of 15 for the treatment of hypoglycemia in detail with the patient. Literature supplied.   2) Diabetic complications:  Eye: Does not have known diabetic retinopathy.  Neuro/ Feet: Does  have known diabetic peripheral neuropathy. Renal: Patient does  have known baseline CKD. She is  on an ACEI/ARB at present.Check urine albumin/creatinine ratio yearly starting at time of diagnosis.      3) Hypothyroidism :  -TSH continues to be above goal -Unfortunately, she has not been using a pillbox even though she does have one.  I did encourage the patient to use a pillbox for levothyroxine   Medication  Levothyroxine 88 mcg daily   F/U in 3 months   Signed electronically by: Lyndle Herrlich, MD  [facility]  [facility] [address] [place] [place], [place] [address] Phone: [phone] FAX: [phone]   CC: Donato Schultz, DO [address] [place] [address] Phone: [phone]  Fax: [phone]    Return to Endocrinology clinic as below: Future Appointments  Date Time Provider Department Center  11/08/2023  2:00 PM Antony Madura, MD [id number] None

## 2023-08-21 NOTE — Patient Instructions (Addendum)
-   Start Jardiance 10 mg daily  - Increase Toujeo 70  units daily  - Continue Trulicity 3 mg ONCE weekly  - Novolog 10 units with each meal PLUS the scale if needed  -Novolog correctional insulin: ADD extra units on insulin to your meal-time Novolog dose if your blood sugars are higher than 160. Use the scale below to help guide you:   Blood sugar before meal Number of units to inject  Less than 160 0 unit  161 -  190 1 units  191 -  220 2 units  221 -  250 3 units  251 -  280 4 units  281 -  310 5 units  311 -  340 6 units  341 -  370 7 units  371 -  400 8 units       HOW TO TREAT LOW BLOOD SUGARS (Blood sugar LESS THAN 70 MG/DL) Please follow the RULE OF 15 for the treatment of hypoglycemia treatment (when your (blood sugars are less than 70 mg/dL)   STEP 1: Take 15 grams of carbohydrates when your blood sugar is low, which includes:  3-4 GLUCOSE TABS  OR 3-4 OZ OF JUICE OR REGULAR SODA OR ONE TUBE OF GLUCOSE GEL    STEP 2: RECHECK blood sugar in 15 MINUTES STEP 3: If your blood sugar is still low at the 15 minute recheck --> then, go back to STEP 1 and treat AGAIN with another 15 grams of carbohydrates.

## 2023-08-22 ENCOUNTER — Other Ambulatory Visit: Payer: Self-pay

## 2023-08-22 DIAGNOSIS — E1165 Type 2 diabetes mellitus with hyperglycemia: Secondary | ICD-10-CM

## 2023-08-22 LAB — T4, FREE: Free T4: 0.9 ng/dL (ref 0.8–1.8)

## 2023-08-22 LAB — TSH: TSH: 3.21 m[IU]/L (ref 0.40–4.50)

## 2023-08-22 MED ORDER — NOVOLOG FLEXPEN 100 UNIT/ML ~~LOC~~ SOPN: PEN_INJECTOR | SUBCUTANEOUS | 3 refills | Status: DC

## 2023-08-22 MED ORDER — LEVOTHYROXINE SODIUM 100 MCG PO TABS: 100.0000 ug | ORAL_TABLET | Freq: Every day | ORAL | 3 refills | Status: DC

## 2023-08-22 MED ORDER — TRULICITY 3 MG/0.5ML ~~LOC~~ SOAJ: 3.0000 mg | SUBCUTANEOUS | 3 refills | Status: DC

## 2023-08-22 MED ORDER — BD PEN NEEDLE NANO U/F 32G X 4 MM MISC: 3 refills | Status: AC

## 2023-08-23 ENCOUNTER — Telehealth: Payer: Self-pay

## 2023-08-23 NOTE — Telephone Encounter (Signed)
LVM with [facility] that manufacture change is okay on the Levothyroxine

## 2023-09-14 ENCOUNTER — Other Ambulatory Visit: Payer: Self-pay | Admitting: Internal Medicine

## 2023-09-16 ENCOUNTER — Other Ambulatory Visit: Payer: Self-pay | Admitting: Internal Medicine

## 2023-09-16 ENCOUNTER — Other Ambulatory Visit: Payer: Self-pay | Admitting: Family Medicine

## 2023-10-10 ENCOUNTER — Other Ambulatory Visit: Payer: Self-pay | Admitting: Behavioral Health

## 2023-10-10 DIAGNOSIS — F5105 Insomnia due to other mental disorder: Secondary | ICD-10-CM

## 2023-10-10 DIAGNOSIS — F411 Generalized anxiety disorder: Secondary | ICD-10-CM

## 2023-10-17 ENCOUNTER — Other Ambulatory Visit: Payer: Self-pay | Admitting: Family Medicine

## 2023-10-30 NOTE — Progress Notes (Deleted)
 I saw Sue [name] in neurology clinic on 11/08/23 in follow up for neuropathy.  HPI: Sue [name] is a 73 y.o. year old female with a history of DM, HFpEF, hypothyroidism, HTN, COPD, depression, anxiety, possible TIA in 2016, and former smoker who we last saw on 05/05/23.  To briefly review: Initial consultation (06/09/22): Symptoms started about 11/2021. She has a burning and stinging pain in her feet and legs. She feels like something is squeezing her legs. She has the feeling of [place]orses in her legs. She feels that the left side is worse than the right. She reports that her left leg pain is near constant while the right seems more intermittent. The pain is mostly from the calf down, but occasional cramping in her thighs. She denies back pain, neck pain, and only rare headaches. She denies numbness or tingling in her hands or arms. Patient walks with a walker for the last 5 years (~2018). She has not had any recent falls.   Patient saw PCP (Dr. Zola Button) on 03/14/22 and mentioned stinging and burning in legs that was worse in the evening. She denied back pain. Patient was tried on gabapentin 100 mg that did not help. She did not feel good on the medication, so she stopped it. She took it about a week. She was referred to neurology for further evaluation.   Patient is on seroquel 200 mg at night to help sleep.   She takes vitamin D and vitamin B12 supplements. She is also taking a nerve supplement from [employer] Isla [employer]) that she thinks is helping.    The patient denies symptoms suggestive of oculobulbar weakness including diplopia, ptosis, dysphagia, poor saliva control, dysarthria/dysphonia, impaired mastication, facial weakness/droop.   The patient does not report symptoms referable to autonomic dysfunction including impaired sweating, heat or cold intolerance, excessive mucosal dryness, gastroparetic early satiety, postprandial abdominal bloating, constipation, bowel or bladder  dyscontrol, or syncope/presyncope/orthostatic intolerance.   She does not report any constitutional symptoms like fever, night sweats, anorexia or unintentional weight loss.   EtOH use: None  Restrictive diet? No Family history of neuropathy/myopathy/NM disease? None   She had not had any physical therapy recently (as of 06/09/22).   02/03/23: MMA and B6 were normal. IFE was indeterminate. EMG on 11/27/21 showed a possible mild sensory predominant neuropathy (vs age related changes). Patient was not getting adequate relief from gabapentin, so was switched to Lyrica 25 mg BID on 11/30/22.    Patient was to have a virtual visit on 12/14/22, but technical problems did not allow [place] to connect. Per phone conversation, patient was doing okay without acute issues, so follow up was rescheduled.   Patient called on 01/06/23 still having leg pain. Lyrica was increased to 50 mg BID on 01/06/23.    Patient is still having a lot of pain. She has pain in feet all the way into the chest. It is a stabbing pain or a bad itch. It can go from feet to thighs. It can wake her up from her sleep. Most of the pain is in her feet and can go up to knee. She is having a lot of knee pain and has had to have injections previously. She has not had the injections in over a year. She has not talked to anyone about this. She is not moving around well currently. She does not feel she is eating well. She has lost about 50 pounds in the last month or so, which she  attributes to not eating. She denies fever or night sweats.   Patient usually has edema in her legs, but now does not have edema and has a lot of pain.   Lyrica was increased to 50 mg TID on 02/03/23.  05/05/23: Labs showed HbA1c of 9.9 (up from 7.7 one year prior), IFE with no M protein, and normal B1. I recommended patient discuss glucose control with PCP to prevent worsening neuropathy among other complications. Patient has not yet seen her DM doctor.   Patient continues  to have burning and stabbing pain in her feet. After increasing Lyrica to 50 mg TID, patient did have some improvement at first, but things got worse again. She is not having side effects to the medication.   Patient was bit by a spider a few months ago in the back of the neck. She is not sure it is healing well. She may have some numbness in her right arm since, more in the 4th and 5th digits.  Most recent Assessment and Plan (05/05/23): This is Sue [name], a 73 y.o. female with pain in bilateral lower extremities. 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 She has previously failed gabapentin and continues to have increase in pain despite increasing doses of Lyrica. She does get some relief, but it does not last long enough. We discussed her worsening HbA1c today (9.9 from 7.7 a year prior) that is likely driving the worsening neuropathy. Patient understands that Lyrica may help with neuropathy, but glucose control is the key to preventing continued worsening. Finally, she describes significant knee pain, so perhaps there is an overlapping MSK component as well.   Plan: -Will increase Lyrica to 50mg /50mg /100mg  -Patient will discuss DM management with endocrinologist -Patient will discuss bite on neck with PCP, does not look infected to me though -Will monitor RUE symptoms closely  Since their last visit: ***  HbA1c now 13.5.***  ROS: Pertinent positive and negative systems reviewed in HPI. ***   MEDICATIONS:  Outpatient Encounter Medications as of 11/08/2023  Medication Sig   Accu-Chek Softclix Lancets lancets USE 1  TO CHECK GLUCOSE 4 TIMES DAILY   albuterol (VENTOLIN HFA) 108 (90 Base) MCG/ACT inhaler Inhale 1-2 puffs into the lungs every 6 (six) hours as needed for wheezing or shortness of breath.   allopurinol (ZYLOPRIM) 100 MG tablet Take 100 mg by mouth daily as needed (for gout flareups).   amLODipine (NORVASC) 5 MG tablet Take by mouth.   aspirin EC 81 MG tablet  Take by mouth.   Blood Glucose Monitoring Suppl (ACCU-CHEK GUIDE) w/Device KIT USE   TO CHECK GLUCOSE UP TO 4 TIMES DAILY AS DIRECTED   busPIRone (BUSPAR) 15 MG tablet Take 1 tablet (15 mg total) by mouth 2 (two) times daily.   diclofenac Sodium (VOLTAREN ARTHRITIS PAIN) 1 % GEL Apply 4 g topically 4 (four) times daily.   Dulaglutide (TRULICITY) 3 MG/0.5ML SOAJ Inject 3 mg into the skin once a week.   empagliflozin (JARDIANCE) 10 MG TABS tablet Take 1 tablet (10 mg total) by mouth daily before breakfast.   famotidine (PEPCID) 20 MG tablet Take 20 mg by mouth daily as needed for heartburn or indigestion.   fluticasone (FLONASE) 50 MCG/ACT nasal spray 2 sprays.   gabapentin (NEURONTIN) 100 MG capsule Take by mouth.   glucose blood (ACCU-CHEK GUIDE) test strip USE 1 STRIP TO CHECK GLUCOSE 4 TIMES DAILY   ibuprofen (ADVIL) 800 MG tablet Take by mouth.   insulin aspart (  NOVOLOG FLEXPEN) 100 UNIT/ML FlexPen Novolog 10 units with each meal PLUS the scale if needed -Novolog correctional insulin: ADD extra units on insulin to your meal-time Novolog dose if your blood sugars are higher than 160. Use the scale below to help guide you:  Blood sugar before meal Number of units to inject Less than 160 0 unit 161 -  190 1 units 191 -  220 2 units 221 -  250 3 units 251 -  280 4 units 281 -  310 5 units 311 -  340 6 units 341 -  370 7 units 371 -  400 8 units Max daily 45 units   insulin glargine, 1 Unit Dial, (TOUJEO SOLOSTAR) 300 UNIT/ML Solostar Pen INJECT 64 UNITS SUBCUTANEOUSLY ONCE DAILY IN THE AFTERNOON.**APPOINTMENT NEEDED FOR FURTHER REFILLS**   Insulin Pen Needle (BD PEN NEEDLE NANO U/F) 32G X 4 MM MISC Use to inject insulin   ketorolac (ACULAR) 0.5 % ophthalmic solution Place one drop into the left eye 4 (four) times daily.   levothyroxine (SYNTHROID) 100 MCG tablet Take 1 tablet (100 mcg total) by mouth daily before breakfast.   LORazepam (ATIVAN) 1 MG tablet TAKE 1/2 TO 1 AND 1/2 TABLETS AT BEDTIME AND  AN ADDITIONAL 1/2 TAB ONCE A DAY AS NEEDED FOR ANXIETY   LORazepam (ATIVAN) 2 MG tablet Take by mouth.   Magnesium 400 MG CAPS Take 1 capsule by mouth daily.   metoprolol succinate (TOPROL-XL) 50 MG 24 hr tablet Take 2 tablets (100 mg total) by mouth daily. Take with or immediately following a meal   moxifloxacin (VIGAMOX) 0.5 % ophthalmic solution Place one drop into the left eye 4 (four) times daily.   OXYGEN Inhale 3-4 L/min into the lungs continuous.    potassium chloride SA (KLOR-CON M) 20 MEQ tablet Take 1 tablet (20 mEq total) by mouth daily.   prednisoLONE acetate (PRED FORTE) 1 % ophthalmic suspension Place one drop into the left eye 4 (four) times daily.   pregabalin (LYRICA) 50 MG capsule Take 50 mg in the morning (1 capsule), take 50 mg midday (1 capsule), and take 100 mg in the evening (2 capsules)   QUEtiapine (SEROQUEL) 200 MG tablet Take 1 tablet (200 mg total) by mouth at bedtime.   rosuvastatin (CRESTOR) 10 MG tablet TAKE 1 TABLET BY MOUTH ONCE DAILY FOR HEART HEALTH AND TO LOWER CHOLESTEROL   Suvorexant (BELSOMRA) 15 MG TABS Take 1 tablet (15 mg total) by mouth at bedtime.   torsemide (DEMADEX) 20 MG tablet Take 3 tablets (60 mg total) by mouth daily.   zolpidem (AMBIEN CR) 12.5 MG CR tablet TAKE 1 TABLET BY MOUTH AT BEDTIME AS NEEDED SLEEP   zolpidem (AMBIEN) 10 MG tablet Take by mouth.   No facility-administered encounter medications on file as of 11/08/2023.    PAST MEDICAL HISTORY: Past Medical History:  Diagnosis Date   Allergic rhinitis    Depression    Diabetes mellitus type 2, uncontrolled    Emphysema of lung (HCC)    3L home O2   GERD (gastroesophageal reflux disease)    Hypertension    Hypothyroidism    Obesity, morbid, BMI 50 or higher (HCC)    Stroke (HCC) 2016   TIA    Urine incontinence     PAST SURGICAL HISTORY: Past Surgical History:  Procedure Laterality Date   ABDOMINAL HYSTERECTOMY     CESAREAN SECTION     COLONOSCOPY N/A 08/19/2019    Procedure: COLONOSCOPY;  Surgeon: Ewing Schlein,  Vernia Buff, MD;  Location: [facility];  Service: Endoscopy;  Laterality: N/A;   COLONOSCOPY WITH PROPOFOL N/A 08/05/2019   Procedure: COLONOSCOPY WITH PROPOFOL;  Surgeon: Charlott Rakes, MD;  Location: [facility];  Service: Gastroenterology;  Laterality: N/A;   COLONOSCOPY WITH PROPOFOL N/A 11/19/2019   Procedure: COLONOSCOPY WITH PROPOFOL;  Surgeon: Bernette Redbird, MD;  Location: [facility];  Service: Endoscopy;  Laterality: N/A;  Unprepped   COLONOSCOPY WITH PROPOFOL N/A 11/22/2019   Procedure: COLONOSCOPY WITH PROPOFOL;  Surgeon: Bernette Redbird, MD;  Location: [facility];  Service: Endoscopy;  Laterality: N/A;   COLONOSCOPY WITH PROPOFOL N/A 11/23/2019   Procedure: COLONOSCOPY WITH PROPOFOL;  Surgeon: Kerin Salen, MD;  Location: [facility];  Service: Gastroenterology;  Laterality: N/A;   COLONOSCOPY WITH PROPOFOL N/A 11/29/2019   Procedure: COLONOSCOPY WITH PROPOFOL;  Surgeon: Charlott Rakes, MD;  Location: [facility];  Service: Endoscopy;  Laterality: N/A;   ENTEROSCOPY N/A 11/24/2019   Procedure: ENTEROSCOPY;  Surgeon: Kerin Salen, MD;  Location: [facility];  Service: Gastroenterology;  Laterality: N/A;   ENTEROSCOPY N/A 11/27/2019   Procedure: ENTEROSCOPY;  Surgeon: Charlott Rakes, MD;  Location: [facility];  Service: Endoscopy;  Laterality: N/A;   ESOPHAGOGASTRODUODENOSCOPY N/A 11/27/2019   Procedure: ESOPHAGOGASTRODUODENOSCOPY (EGD);  Surgeon: Charlott Rakes, MD;  Location: [facility];  Service: Endoscopy;  Laterality: N/A;   ESOPHAGOGASTRODUODENOSCOPY (EGD) WITH PROPOFOL N/A 11/24/2019   Procedure: ESOPHAGOGASTRODUODENOSCOPY (EGD) WITH PROPOFOL;  Surgeon: Kerin Salen, MD;  Location: [facility];  Service: Gastroenterology;  Laterality: N/A;  PUSH enteroscopy   GIVENS CAPSULE STUDY N/A 11/19/2019   Procedure: GIVENS CAPSULE STUDY;  Surgeon: Bernette Redbird, MD;  Location: [facility];  Service: Endoscopy;  Laterality: N/A;  To be  performed immediately following colonoscopy   GIVENS CAPSULE STUDY N/A 11/24/2019   Procedure: GIVENS CAPSULE STUDY;  Surgeon: Kerin Salen, MD;  Location: [facility];  Service: Gastroenterology;  Laterality: N/A;   GIVENS CAPSULE STUDY N/A 11/28/2019   Procedure: GIVENS CAPSULE STUDY;  Surgeon: Charlott Rakes, MD;  Location: [facility];  Service: Endoscopy;  Laterality: N/A;   HEMOSTASIS CLIP PLACEMENT  11/19/2019   Procedure: HEMOSTASIS CLIP PLACEMENT;  Surgeon: Bernette Redbird, MD;  Location: [facility];  Service: Endoscopy;;   HEMOSTASIS CLIP PLACEMENT  11/22/2019   Procedure: HEMOSTASIS CLIP PLACEMENT;  Surgeon: Bernette Redbird, MD;  Location: [facility];  Service: Endoscopy;;   HOT HEMOSTASIS N/A 11/24/2019   Procedure: HOT HEMOSTASIS (ARGON PLASMA COAGULATION/BICAP);  Surgeon: Kerin Salen, MD;  Location: [facility];  Service: Gastroenterology;  Laterality: N/A;   HOT HEMOSTASIS N/A 11/27/2019   Procedure: HOT HEMOSTASIS (ARGON PLASMA COAGULATION/BICAP);  Surgeon: Charlott Rakes, MD;  Location: [facility];  Service: Endoscopy;  Laterality: N/A;   SUBMUCOSAL TATTOO INJECTION  11/19/2019   Procedure: SUBMUCOSAL TATTOO INJECTION;  Surgeon: Bernette Redbird, MD;  Location: [facility];  Service: Endoscopy;;    ALLERGIES: Allergies  Allergen Reactions   Hydrocodone Other (See Comments)    Constipation and hallucinations   Norvasc [Amlodipine Besylate] Swelling and Other (See Comments)    Marked swelling of the limbs   Tizanidine Other (See Comments)    After the 3rd dose, the patient's mouth began to feel numb and she felt like she was a having a "hot flash"    FAMILY HISTORY: Family History  Problem Relation Age of Onset   Heart disease Father        MVP and Pics Valve   Hypertension Father    Depression Father        Institutionalized  x's 2 years   Bipolar disorder Father    Hypertension Sister    Diabetes Sister    Hyperlipidemia Sister    Heart disease Sister 72        MI   Heart disease Brother    Hypertension Brother    Schizophrenia Paternal Aunt    Depression Paternal Aunt    Anxiety disorder Paternal Aunt    Heart disease Paternal Aunt    Schizophrenia Paternal Aunt    Heart disease Paternal Uncle    Heart disease Paternal Grandmother    Asthma Son    Asthma Son     SOCIAL HISTORY: Social History   Tobacco Use   Smoking status: Former    Current packs/day: 0.00    Average packs/day: 1 pack/day for 40.0 years (40.0 ttl pk-yrs)    Types: Cigarettes    Start date: 07/21/1972    Quit date: 10/16/2011    Years since quitting: 12.0   Smokeless tobacco: Never  Vaping Use   Vaping status: Never Used  Substance Use Topics   Alcohol use: Not Currently    Comment: Occ-- Wine   Drug use: No   Social History   Social History Narrative      Originally from [place]. Has also lived in [place], [place]. She also previously lived in [place] for 20 years. No history of [place] Fever. Moved to [place] in 1989. 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 No pets currently. No bird exposure. Questionable previous mold exposure in her daughter's home. 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       Lives in [place] alone in assisted living.  Has multiple chronic illness.      Are you right handed or left handed? Right handed   Are you currently employed ? no   What is your current occupation? retired   Do you live at home alone?no   Who lives with you? son   What type of home do you live in: 1 story or 2 story? Apartment first floor        Objective:  Vital [name]:  There were no vitals taken for this visit.  General:*** General appearance: Awake and alert. No distress. Cooperative with exam.  Skin: No obvious rash or  jaundice. HEENT: Atraumatic. Anicteric. Lungs: Non-labored breathing on room air  Heart: Regular Abdomen: Soft, non tender. Extremities: No edema. No obvious deformity.  Musculoskeletal: No obvious joint swelling.  Neurological: Mental Status: Alert. Speech fluent. No pseudobulbar affect Cranial Nerves: CNII: No RAPD. Visual fields intact. CNIII, IV, VI: PERRL. No nystagmus. EOMI. CN V: Facial sensation intact bilaterally to fine touch. [place] clench strong. Jaw jerk***. CN VII: Facial muscles symmetric and strong. No ptosis at rest or after sustained upgaze***. CN VIII: Hears finger rub well bilaterally. CN IX: No hypophonia. CN X: Palate elevates symmetrically. CN XI: Full strength shoulder shrug bilaterally. CN XII: Tongue protrusion full and midline. No atrophy or fasciculations. No significant dysarthria*** Motor: Tone is ***. *** fasciculations in *** extremities. *** atrophy. No grip or percussive myotonia.  Individual muscle group testing (MRC grade out of 5):  Movement     Neck flexion ***    Neck extension ***     Right Left   Shoulder abduction *** ***  Shoulder adduction *** ***   Shoulder ext rotation *** ***   Shoulder int rotation *** ***   Elbow flexion *** ***   Elbow extension *** ***   Wrist extension *** ***   Wrist flexion *** ***   Finger abduction - FDI *** ***   Finger abduction - ADM *** ***   Finger extension *** ***   Finger distal flexion - 2/3 *** ***   Finger distal flexion - 4/5 *** ***   Thumb flexion - FPL *** ***   Thumb abduction - APB *** ***    Hip flexion *** ***   Hip extension *** ***   Hip adduction *** ***   Hip abduction *** ***   Knee extension *** ***   Knee flexion *** ***   Dorsiflexion *** ***   Plantarflexion *** ***   Inversion *** ***   Eversion *** ***   Great toe extension *** ***   Great toe flexion *** ***     Reflexes:  Right Left  Bicep *** ***  Tricep *** ***  BrRad *** ***  Knee *** ***   Ankle *** ***   Pathological Reflexes: Babinski: *** response bilaterally*** Hoffman: *** Troemner: *** Pectoral: *** Palmomental: *** Facial: *** Midline tap: *** Sensation: Pinprick: *** Vibration: *** Temperature: *** Proprioception: *** Coordination: Intact finger-to- nose-finger and heel-to-shin bilaterally. Romberg negative.*** Gait: Able to rise from chair with arms crossed unassisted. Normal, narrow-based gait. Able to tandem walk. Able to walk on toes and heels.***   Lab and Test Review: New results: 08/21/23: TSH wnl Free T4 wnl HbA1c: 13.5  Previously reviewed results: 02/03/23: IFE: no M protein HbA1c: 9.9 B1 wnl   03/03/23: CBC significant for chronic anemia (Hb 11.9) CMP significant for K of 3.3 (chronic), Cr 1.63 (chronic) TSH mildly elevated at 5.94   06/09/22: IFE: poorly defined area of restricted protein mobility (reactive with IgG and kappa) B6 wnl MMA wnl   TSH (09/01/22): 5.455 BMP (10/28/22): significant for K 3.0, glucose 382, Cr 1.95 CMP (12/23/22): K of 2.9, glucose 431, Cr 1.76, AST 47, ALT 23 CBC (10/28/22): unremarkable  Normal or unremarkable:  03/14/22: TSH: 15.21 HbA1c: 7.7 CMP significant for elevated glucose (201) and Cr (1.38) CBC significant for elevated MCV (106.3) B12 (01/07/20): 448   EMG (11/28/22): NCV & EMG Findings: Extensive electrodiagnostic evaluation of the left lower limb with additional nerve conduction studies of the left upper limb shows: Left sural and superficial peroneal/fibular sensory responses are absent. Left median, ulnar, and radial sensory responses are within normal limits. Left peroneal/fibular (EDB), tibial (AH), median (APB), and ulnar (ADM) motor responses are within normal limits. Left H reflex is absent. There is no evidence of active or chronic motor axon loss changes affecting any of the tested muscles on needle examination. Motor unit configuration and recruitment pattern is within normal  limits.   Impression: The findings above are most consistent with the following: Absent lower limb sensory responses and H reflex may be normal at this age or represent evidence of a large fiber sensory predominant neuropathy. No electrodiagnostic evidence of a left lumbosacral (L3-S1) motor radiculopathy. No electrodiagnostic evidence of a left median mononeuropathy at or distal to the wrist (ie: carpal tunnel syndrome). Screening studies for left ulnar and radial mononeuropathies are normal.   CT head wo contrast (for mental status change in setting of hyperglycemia) (10/28/22): FINDINGS: Brain: No acute territorial infarction, hemorrhage or intracranial mass. Mild atrophy. Nonenlarged ventricles.   Vascular:  No hyperdense vessels.  Carotid vascular calcification.   Skull: Normal. Negative for fracture or focal lesion.   Sinuses/Orbits: Old fracture deformity medial wall left orbit. Moderate mucosal thickening in the right maxillary sinus with scattered calcific deposits.   Other: None.   IMPRESSION: No CT evidence for acute intracranial abnormality. Mild atrophy.   MRI brain wo contrast (01/01/20): CLINICAL DATA:  TIA. Transient left facial numbness and slurred speech.   EXAM: MRI HEAD WITHOUT CONTRAST   TECHNIQUE: Multiplanar, multiecho pulse sequences of the brain and surrounding structures were obtained without intravenous contrast.   COMPARISON:  Head CT 01/01/2020 and MRI 07/30/2014   FINDINGS: Brain: There is no evidence of acute infarct, intracranial hemorrhage, mass, midline shift, or extra-axial fluid collection. The ventricles and sulci are normal. T2 hyperintensities in the cerebral white matter bilaterally are unchanged from the prior MRI and nonspecific but compatible with minimal chronic small vessel ischemic disease.   Vascular: Major intracranial vascular flow voids are preserved.   Skull and upper cervical spine: Unremarkable bone marrow signal.    Sinuses/Orbits: Remote medial left orbital fracture. Chronic right sphenoid sinusitis. Clear mastoid air cells.   Other: None.   IMPRESSION: 1. No acute intracranial abnormality. 2. Minimal chronic small vessel ischemic disease.  ASSESSMENT: This is Sue [name], a 73 y.o. female with:  ***  Plan: ***  Return to clinic in ***  Total time spent reviewing records, interview, history/exam, documentation, and coordination of care on day of encounter:  *** min  Jacquelyne Balint, MD

## 2023-10-31 ENCOUNTER — Other Ambulatory Visit: Payer: Self-pay | Admitting: Family Medicine

## 2023-10-31 ENCOUNTER — Telehealth: Payer: Medicare PPO | Admitting: Behavioral Health

## 2023-10-31 ENCOUNTER — Encounter: Payer: Self-pay | Admitting: Behavioral Health

## 2023-10-31 DIAGNOSIS — F5105 Insomnia due to other mental disorder: Secondary | ICD-10-CM | POA: Diagnosis not present

## 2023-10-31 DIAGNOSIS — F419 Anxiety disorder, unspecified: Secondary | ICD-10-CM | POA: Diagnosis not present

## 2023-10-31 DIAGNOSIS — F32A Depression, unspecified: Secondary | ICD-10-CM | POA: Diagnosis not present

## 2023-10-31 DIAGNOSIS — F99 Mental disorder, not otherwise specified: Secondary | ICD-10-CM | POA: Diagnosis not present

## 2023-10-31 DIAGNOSIS — F3131 Bipolar disorder, current episode depressed, mild: Secondary | ICD-10-CM

## 2023-10-31 DIAGNOSIS — E876 Hypokalemia: Secondary | ICD-10-CM

## 2023-10-31 DIAGNOSIS — F411 Generalized anxiety disorder: Secondary | ICD-10-CM

## 2023-10-31 MED ORDER — POTASSIUM CHLORIDE CRYS ER 20 MEQ PO TBCR: 20.0000 meq | EXTENDED_RELEASE_TABLET | Freq: Every day | ORAL | 0 refills | Status: DC

## 2023-10-31 NOTE — Progress Notes (Signed)
Sue [name] [id number] [birth year] 73 y.o.  Virtual Visit via Video Note  I connected with pt @ on 10/31/23 at 11:00 AM EST by a video enabled telemedicine application and verified that I am speaking with the correct person using two identifiers.   I discussed the limitations of evaluation and management by telemedicine and the availability of in person appointments. The patient expressed understanding and agreed to proceed.  I discussed the assessment and treatment plan with the patient. The patient was provided an opportunity to ask questions and all were answered. The patient agreed with the plan and demonstrated an understanding of the instructions.   The patient was advised to call back or seek an in-person evaluation if the symptoms worsen or if the condition fails to improve as anticipated.  I provided 20 minutes of non-face-to-face time during this encounter.  The patient was located at home.  The provider was located at [facility].   Sue Flores, NP   Subjective:   Patient ID:  Sue [name] is a 73 y.o. (DOB [birth year]) female.  Chief Complaint: No chief complaint on file.   HPI 73  year old female presents to this office for follow up and medication management.  No psychosocial changes this visit. Says she is managing the best she can. Says that she has no complaint today.  Requesting no medication changes this visit. Just needing refills. Says she is doing ok.  Currently her anxiety level is 3/10 and depression is 3/10 but says that health issues keep her at this level.  She denies having any problems taking this medication. Denies any current mania which she said was dominate when she was younger. No psychosis, no SI/HI. She has strong family hx of bipolar and schizophrenia on fathers side.    Unable to obtain prior medication list.      Review of Systems:  Review of Systems  Constitutional: Negative.   Allergic/Immunologic: Negative.    Psychiatric/Behavioral:  Positive for dysphoric mood. The patient is nervous/anxious.     Medications: I have reviewed the patient's current medications.  Current Outpatient Medications  Medication Sig Dispense Refill   Accu-Chek Softclix Lancets lancets USE 1  TO CHECK GLUCOSE 4 TIMES DAILY 100 each 2   albuterol (VENTOLIN HFA) 108 (90 Base) MCG/ACT inhaler Inhale 1-2 puffs into the lungs every 6 (six) hours as needed for wheezing or shortness of breath. 1 each 0   allopurinol (ZYLOPRIM) 100 MG tablet Take 100 mg by mouth daily as needed (for gout flareups).     amLODipine (NORVASC) 5 MG tablet Take by mouth.     aspirin EC 81 MG tablet Take by mouth.     Blood Glucose Monitoring Suppl (ACCU-CHEK GUIDE) w/Device KIT USE   TO CHECK GLUCOSE UP TO 4 TIMES DAILY AS DIRECTED 1 kit 0   busPIRone (BUSPAR) 15 MG tablet Take 1 tablet (15 mg total) by mouth 2 (two) times daily. 60 tablet 3   diclofenac Sodium (VOLTAREN ARTHRITIS PAIN) 1 % GEL Apply 4 g topically 4 (four) times daily. 150 g 2   Dulaglutide (TRULICITY) 3 MG/0.5ML SOAJ Inject 3 mg into the skin once a week. 6 mL 3   empagliflozin (JARDIANCE) 10 MG TABS tablet Take 1 tablet (10 mg total) by mouth daily before breakfast. 90 tablet 3   famotidine (PEPCID) 20 MG tablet Take 20 mg by mouth daily as needed for heartburn or indigestion.     fluticasone (FLONASE) 50 MCG/ACT nasal spray  2 sprays.     gabapentin (NEURONTIN) 100 MG capsule Take by mouth.     glucose blood (ACCU-CHEK GUIDE) test strip USE 1 STRIP TO CHECK GLUCOSE 4 TIMES DAILY 200 each 12   ibuprofen (ADVIL) 800 MG tablet Take by mouth.     insulin aspart (NOVOLOG FLEXPEN) 100 UNIT/ML FlexPen Novolog 10 units with each meal PLUS the scale if needed -Novolog correctional insulin: ADD extra units on insulin to your meal-time Novolog dose if your blood sugars are higher than 160. Use the scale below to help guide you:  Blood sugar before meal Number of units to inject Less than 160 0  unit 161 -  190 1 units 191 -  220 2 units 221 -  250 3 units 251 -  280 4 units 281 -  310 5 units 311 -  340 6 units 341 -  370 7 units 371 -  400 8 units Max daily 45 units 45 mL 3   insulin glargine, 1 Unit Dial, (TOUJEO SOLOSTAR) 300 UNIT/ML Solostar Pen INJECT 64 UNITS SUBCUTANEOUSLY ONCE DAILY IN THE AFTERNOON.**APPOINTMENT NEEDED FOR FURTHER REFILLS** 6 mL 1   Insulin Pen Needle (BD PEN NEEDLE NANO U/F) 32G X 4 MM MISC Use to inject insulin 200 each 3   ketorolac (ACULAR) 0.5 % ophthalmic solution Place one drop into the left eye 4 (four) times daily.     levothyroxine (SYNTHROID) 100 MCG tablet Take 1 tablet (100 mcg total) by mouth daily before breakfast. 90 tablet 3   LORazepam (ATIVAN) 1 MG tablet TAKE 1/2 TO 1 AND 1/2 TABLETS AT BEDTIME AND AN ADDITIONAL 1/2 TAB ONCE A DAY AS NEEDED FOR ANXIETY 60 tablet 0   LORazepam (ATIVAN) 2 MG tablet Take by mouth.     Magnesium 400 MG CAPS Take 1 capsule by mouth daily. 30 capsule 0   metoprolol succinate (TOPROL-XL) 50 MG 24 hr tablet Take 2 tablets (100 mg total) by mouth daily. Take with or immediately following a meal 60 tablet 0   moxifloxacin (VIGAMOX) 0.5 % ophthalmic solution Place one drop into the left eye 4 (four) times daily.     OXYGEN Inhale 3-4 L/min into the lungs continuous.      potassium chloride SA (KLOR-CON M) 20 MEQ tablet Take 1 tablet (20 mEq total) by mouth daily. 30 tablet 0   prednisoLONE acetate (PRED FORTE) 1 % ophthalmic suspension Place one drop into the left eye 4 (four) times daily.     pregabalin (LYRICA) 50 MG capsule Take 50 mg in the morning (1 capsule), take 50 mg midday (1 capsule), and take 100 mg in the evening (2 capsules) 120 capsule 5   QUEtiapine (SEROQUEL) 200 MG tablet Take 1 tablet (200 mg total) by mouth at bedtime. 90 tablet 3   rosuvastatin (CRESTOR) 10 MG tablet TAKE 1 TABLET BY MOUTH ONCE DAILY FOR HEART HEALTH AND TO LOWER CHOLESTEROL 90 tablet 0   Suvorexant (BELSOMRA) 15 MG TABS Take 1 tablet  (15 mg total) by mouth at bedtime. 30 tablet 1   torsemide (DEMADEX) 20 MG tablet Take 3 tablets (60 mg total) by mouth daily. 90 tablet 0   zolpidem (AMBIEN CR) 12.5 MG CR tablet TAKE 1 TABLET BY MOUTH AT BEDTIME AS NEEDED SLEEP 30 tablet 0   zolpidem (AMBIEN) 10 MG tablet Take by mouth.     No current facility-administered medications for this visit.    Medication Side Effects: None  Allergies:  Allergies  Allergen  Reactions   Hydrocodone Other (See Comments)    Constipation and hallucinations   Norvasc [Amlodipine Besylate] Swelling and Other (See Comments)    Marked swelling of the limbs   Tizanidine Other (See Comments)    After the 3rd dose, the patient's mouth began to feel numb and she felt like she was a having a "hot flash"    Past Medical History:  Diagnosis Date   Allergic rhinitis    Depression    Diabetes mellitus type 2, uncontrolled    Emphysema of lung (HCC)    3L home O2   GERD (gastroesophageal reflux disease)    Hypertension    Hypothyroidism    Obesity, morbid, BMI 50 or higher (HCC)    Stroke (HCC) 2016   TIA    Urine incontinence     Family History  Problem Relation Age of Onset   Heart disease Father        MVP and Pics Valve   Hypertension Father    Depression Father        Institutionalized x's 2 years   Bipolar disorder Father    Hypertension Sister    Diabetes Sister    Hyperlipidemia Sister    Heart disease Sister 9       MI   Heart disease Brother    Hypertension Brother    Schizophrenia Paternal Aunt    Depression Paternal Aunt    Anxiety disorder Paternal Aunt    Heart disease Paternal Aunt    Schizophrenia Paternal Aunt    Heart disease Paternal Uncle    Heart disease Paternal Grandmother    Asthma Son    Asthma Son     Social History   Socioeconomic History   Marital status: Divorced    Spouse name: Not on file   Number of children: 4   Years of education: 14   Highest education level: Associate degree: academic  program  Occupational History   Occupation: disabled- Biochemist, clinical Med  Tobacco Use   Smoking status: Former    Current packs/day: 0.00    Average packs/day: 1 pack/day for 40.0 years (40.0 ttl pk-yrs)    Types: Cigarettes    Start date: 07/21/1972    Quit date: 10/16/2011    Years since quitting: 12.0   Smokeless tobacco: Never  Vaping Use   Vaping status: Never Used  Substance and Sexual Activity   Alcohol use: Not Currently    Comment: Occ-- Wine   Drug use: No   Sexual activity: Not Currently  Other Topics Concern   Not on file  Social History Narrative      Originally from [place]. Has also lived in [place], [place]. She also previously lived in [place] for 20 years. No history of Valley Fever. Moved to [employer] [employer] in 1989. 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 No pets currently. No bird exposure. Questionable previous mold exposure in her daughter's home. 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       Lives in [place] alone in assisted living.  Has multiple chronic illness.      Are you right handed or left handed? Right handed   Are you currently employed ? no   What is your current occupation? retired   Do you live  at home alone?no   Who lives with you? son   What type of home do you live in: 1 story or 2 story? Apartment first floor       Social Drivers of Health   Financial Resource Strain: Low Risk  (01/24/2022)   Overall Financial Resource Strain (CARDIA)    Difficulty of Paying Living Expenses: Not hard at all  Food Insecurity: No Food Insecurity (01/27/2023)   Hunger Vital Sign    Worried About Running Out of Food in the Last Year: Never true    Ran Out of Food in the Last Year: Never true  Transportation Needs: No Transportation Needs  (01/27/2023)   PRAPARE - Administrator, Civil Service (Medical): No    Lack of Transportation (Non-Medical): No  Physical Activity: Inactive (07/04/2023)   Exercise Vital Sign    Days of Exercise per Week: 0 days    Minutes of Exercise per Session: 0 min  Stress: Stress Concern Present (07/04/2023)   [employer] of Occupational Health - Occupational Stress Questionnaire    Feeling of Stress : To some extent  Social Connections: Unknown (11/03/2022)   Received from [facility]   Social Network    Social Network: Not on file  Intimate Partner Violence: Not At Risk (01/27/2023)   Humiliation, Afraid, Rape, and Kick questionnaire    Fear of Current or Ex-Partner: No    Emotionally Abused: No    Physically Abused: No    Sexually Abused: No    Past Medical History, Surgical history, Social history, and Family history were reviewed and updated as appropriate.   Please see review of systems for further details on the patient's review from today.   Objective:   Physical Exam:  There were no vitals taken for this visit.  Physical Exam Neurological:     Mental Status: She is alert and oriented to person, place, and time.  Psychiatric:        Attention and Perception: Attention and perception normal.        Mood and Affect: Mood normal.        Speech: Speech normal.        Behavior: Behavior normal. Behavior is cooperative.        Cognition and Memory: Cognition and memory normal.        Judgment: Judgment normal.     Comments: Insight intact     Lab Review:     Component Value Date/Time   NA 139 03/03/2023 0924   NA 137 05/27/2021 1511   K 3.3 (L) 03/03/2023 0924   CL 99 03/03/2023 0924   CO2 31 03/03/2023 0924   GLUCOSE 120 (H) 03/03/2023 0924   BUN 13 03/03/2023 0924   BUN 13 05/27/2021 1511   CREATININE 1.63 (H) 03/03/2023 0924   CREATININE 1.45 (H) 07/09/2021 1446   CALCIUM 8.3 (L) 03/03/2023 0924   PROT 8.0 03/03/2023 0924    ALBUMIN 3.3 (L) 03/03/2023 0924   AST 22 03/03/2023 0924   ALT 12 03/03/2023 0924   ALKPHOS 111 03/03/2023 0924   BILITOT 0.6 03/03/2023 0924   GFRNONAA 31 (L) 12/23/2022 1101   GFRAA 31 (L) 01/01/2020 0951       Component Value Date/Time   WBC 6.2 03/03/2023 0924   RBC 3.77 (L) 03/03/2023 0924   HGB 11.9 (L) 03/03/2023 0924   HGB 12.6 01/17/2018 1616   HCT 36.5 03/03/2023 0924   HCT 32.3 (L) 03/10/2019 0545   PLT  178.0 03/03/2023 0924   PLT 235 01/17/2018 1616   MCV 96.8 03/03/2023 0924   MCV 96 01/17/2018 1616   MCH 32.3 12/23/2022 1101   MCHC 32.5 03/03/2023 0924   RDW 13.1 03/03/2023 0924   RDW 14.0 01/17/2018 1616   LYMPHSABS 2.5 03/03/2023 0924   MONOABS 0.5 03/03/2023 0924   EOSABS 0.2 03/03/2023 0924   BASOSABS 0.0 03/03/2023 0924    No results found for: "POCLITH", "LITHIUM"   No results found for: "PHENYTOIN", "PHENOBARB", "VALPROATE", "CBMZ"   .res Assessment: Plan:    Recommendations/Plan   Greater than 50% of  20  min  telephonic interview with patient was spent on counseling and coordination of care.We reviewed her currently level of stability.  She is not requesting any changes today but needs refills.  Today we agreed to: Continue Zolpidem 12.5 mg CR Continue Buspar 15 mg twice daily Continue Ativan 0.5-1.5 tablets  (1 mg 0.5-1.5 mg total) by mouth. Take 1-1.5 mg at bedtime and an additional 0.5 mg once a day needed for anxiety. Increased  Seroquel to 200 mg daily at bedtime To contact this office if worsening symptoms Will follow up in 6 months to reassess. She is requesting video visit next time due to ambulation and transportation issues. Provided emergency contact information Reviewed PDMP   Arlys John A. Jacqulin Brandenburger, NP     There are no diagnoses linked to this encounter.   Please see After Visit Summary for patient specific instructions.  Future Appointments  Date Time Provider Department Center  11/07/2023  1:15 PM [facility]     No orders of the defined types were placed in this encounter.     -------------------------------

## 2023-10-31 NOTE — Telephone Encounter (Signed)
Copied from CRM [id number]. Topic: Clinical - Medication Refill >> Oct 31, 2023 11:59 AM Claudine Mouton wrote: Most Recent Primary Care Visit:  Provider: Henrene Pastor  Department: [facility]  Visit Type: TELEPHONE OFFICE VISIT  Date: 04/19/2023  Medication: potassium chloride SA (KLOR-CON M) 20 MEQ tablet  Has the patient contacted their pharmacy? No (Agent: If no, request that the patient contact the pharmacy for the refill. If patient does not wish to contact the pharmacy document the reason why and proceed with request.) (Agent: If yes, when and what did the pharmacy advise?)  Is this the correct pharmacy for this prescription? No If no, delete pharmacy and type the correct one.  This is the patient's preferred pharmacy:   [facility] [address] [place] - [address] [place] [address] Phone: [phone] Fax: [phone]  Has the prescription been filled recently? No  Is the patient out of the medication? Yes  Has the patient been seen for an appointment in the last year OR does the patient have an upcoming appointment? Yes  Can we respond through MyChart? No  Agent: Please be advised that Rx refills may take up to 3 business days. We ask that you follow-up with your pharmacy.

## 2023-11-07 ENCOUNTER — Other Ambulatory Visit: Payer: Medicare PPO

## 2023-11-07 ENCOUNTER — Ambulatory Visit (HOSPITAL_BASED_OUTPATIENT_CLINIC_OR_DEPARTMENT_OTHER)
Admission: RE | Admit: 2023-11-07 | Discharge: 2023-11-07 | Disposition: A | Discharge status: Home / Self Care | Payer: Medicare PPO | Source: Ambulatory Visit | Attending: Family Medicine | Admitting: Family Medicine

## 2023-11-07 ENCOUNTER — Ambulatory Visit (INDEPENDENT_AMBULATORY_CARE_PROVIDER_SITE_OTHER): Payer: Medicare PPO | Admitting: Family Medicine

## 2023-11-07 ENCOUNTER — Ambulatory Visit: Payer: Medicare PPO | Admitting: Family Medicine

## 2023-11-07 ENCOUNTER — Encounter: Payer: Self-pay | Admitting: Family Medicine

## 2023-11-07 VITALS — BP 160/88 | HR 92 | Temp 98.7°F | Resp 22 | Ht 63.0 in | Wt 268.4 lb

## 2023-11-07 DIAGNOSIS — N1832 Chronic kidney disease, stage 3b: Secondary | ICD-10-CM

## 2023-11-07 DIAGNOSIS — Z23 Encounter for immunization: Secondary | ICD-10-CM | POA: Diagnosis not present

## 2023-11-07 DIAGNOSIS — W01190A Fall on same level from slipping, tripping and stumbling with subsequent striking against furniture, initial encounter: Secondary | ICD-10-CM | POA: Diagnosis not present

## 2023-11-07 DIAGNOSIS — E876 Hypokalemia: Secondary | ICD-10-CM

## 2023-11-07 DIAGNOSIS — Z043 Encounter for examination and observation following other accident: Secondary | ICD-10-CM | POA: Diagnosis not present

## 2023-11-07 DIAGNOSIS — W19XXXA Unspecified fall, initial encounter: Secondary | ICD-10-CM

## 2023-11-07 DIAGNOSIS — J9611 Chronic respiratory failure with hypoxia: Secondary | ICD-10-CM | POA: Diagnosis not present

## 2023-11-07 DIAGNOSIS — E785 Hyperlipidemia, unspecified: Secondary | ICD-10-CM | POA: Diagnosis not present

## 2023-11-07 DIAGNOSIS — I5032 Chronic diastolic (congestive) heart failure: Secondary | ICD-10-CM

## 2023-11-07 DIAGNOSIS — E1165 Type 2 diabetes mellitus with hyperglycemia: Secondary | ICD-10-CM

## 2023-11-07 DIAGNOSIS — M542 Cervicalgia: Secondary | ICD-10-CM | POA: Diagnosis present

## 2023-11-07 DIAGNOSIS — M50322 Other cervical disc degeneration at C5-C6 level: Secondary | ICD-10-CM | POA: Insufficient documentation

## 2023-11-07 DIAGNOSIS — E1169 Type 2 diabetes mellitus with other specified complication: Secondary | ICD-10-CM

## 2023-11-07 DIAGNOSIS — M4802 Spinal stenosis, cervical region: Secondary | ICD-10-CM | POA: Diagnosis not present

## 2023-11-07 DIAGNOSIS — S0990XA Unspecified injury of head, initial encounter: Secondary | ICD-10-CM | POA: Insufficient documentation

## 2023-11-07 DIAGNOSIS — J439 Emphysema, unspecified: Secondary | ICD-10-CM

## 2023-11-07 DIAGNOSIS — E039 Hypothyroidism, unspecified: Secondary | ICD-10-CM

## 2023-11-07 DIAGNOSIS — G629 Polyneuropathy, unspecified: Secondary | ICD-10-CM

## 2023-11-07 DIAGNOSIS — I10 Essential (primary) hypertension: Secondary | ICD-10-CM

## 2023-11-07 NOTE — Progress Notes (Unsigned)
 Established Patient Office Visit  Subjective   Patient ID: Sue [name], female    DOB: [birth year]  Age: 73 y.o. MRN: [id number]  Chief Complaint  Patient presents with   Diabetes   Hypothyroidism    HPI Discussed the use of AI scribe software for clinical note transcription with the patient, who gave verbal consent to proceed.  History of Present Illness   Sue [name] is a 73 year old female with diabetes who presents for follow-up on her blood sugar management and recent falls. She is accompanied by her daughter, who schedules her appointments.  Her blood sugar levels have been stable since her last visit, where her medications were adjusted. Her A1c was 13.5 in December, which was high. Since then, Trulicity and Jardiance were added to her regimen. She has not been in contact with the pharmacist for medication adjustments or refills.  She experienced swelling in her legs, which had improved over the last few weeks. However, she fell last Thursday, resulting in persistent pain in her neck and head after hitting her head on a coffee table. She describes ongoing pain in her neck, particularly on one side, and feels wobbly when walking, requiring support to maintain balance. She had a similar fall two weeks prior. No dizziness or loss of balance prior to her falls, but she reports pain in her neck and head following the fall.  Her blood pressure was slightly elevated today, although it usually runs low. She attributes the elevation to rushing before the appointment.  She takes torasemide after dinner.  She has not yet received her flu shot.      Patient Active Problem List   Diagnosis Date Noted   Acute encephalopathy 09/02/2022   Hypotension 09/02/2022   Type 2 diabetes mellitus with hyperglycemia, with long-term current use of insulin (HCC) 04/29/2022   Neuropathy 03/14/2022   Lower extremity edema 07/16/2021   Need for influenza vaccination 07/16/2021   Type 2 diabetes  mellitus with stage 3b chronic kidney disease, with long-term current use of insulin (HCC) 05/18/2021   Type 2 diabetes mellitus with diabetic polyneuropathy, with long-term current use of insulin (HCC) 05/18/2021   Urine incontinence 05/10/2021   Rash 04/05/2021   Palpitation 04/05/2021   Acute bronchitis due to COVID-19 virus 03/09/2021   Hyperlipidemia 02/01/2021   Left knee pain 02/01/2021   Palpitations 02/01/2021   Chronic pain of both shoulders 05/29/2020   Chronic diastolic heart failure (HCC) 01/08/2020   CKD (chronic kidney disease) stage 3, GFR 30-59 ml/min (HCC) 11/13/2019   Acute blood loss anemia 11/13/2019   Lower GI bleed 08/16/2019   Symptomatic anemia 08/03/2019   QT prolongation 08/03/2019   GI bleed 08/02/2019   Diabetes mellitus with renal complications (HCC) 05/29/2019   Hyperlipidemia associated with type 2 diabetes mellitus (HCC) 05/29/2019   DVT (deep venous thrombosis) (HCC) 03/17/2019   Acute on chronic renal insufficiency 03/17/2019   Macrocytic anemia 03/17/2019   Small bowel obstruction (HCC) 03/08/2019   Fracture of left tibia 02/25/2019   Left tibial fracture 02/25/2019   Hyperglycemia 08/16/2018   AKI (acute kidney injury) (HCC) 08/15/2018   Morbid obesity due to excess calories (HCC) 05/09/2018   Acute bronchitis with COPD (HCC) 10/19/2017   Atypical chest pain 05/01/2017   Pulmonary emphysema (HCC) 04/06/2017   Chronic seasonal allergic rhinitis 04/06/2017   GERD (gastroesophageal reflux disease) 04/06/2017   Snoring 04/06/2017   Myalgia 02/01/2017   Arthralgia 02/01/2017   Chronic pain of toe  of left foot 11/14/2016   Chronic hepatitis C without hepatic coma (HCC) 08/01/2016   Hypothyroidism 10/29/2015   Mild diastolic dysfunction 01/01/2015   Edema 01/01/2015   Edema of both ankles 12/24/2014   Stroke (HCC) 2016   TIA (transient ischemic attack) 07/30/2014   Weakness 07/29/2014   Flank pain 11/12/2013   Chronic respiratory failure  (HCC) 10/25/2013   DOE (dyspnea on exertion) 10/25/2013   Depression with anxiety 10/15/2013   Insomnia 10/15/2013   Essential hypertension 10/09/2013   Hypokalemia 10/09/2013   Past Medical History:  Diagnosis Date   Allergic rhinitis    Depression    Diabetes mellitus type 2, uncontrolled    Emphysema of lung (HCC)    3L home O2   GERD (gastroesophageal reflux disease)    Hypertension    Hypothyroidism    Obesity, morbid, BMI 50 or higher (HCC)    Stroke (HCC) 2016   TIA    Urine incontinence    Past Surgical History:  Procedure Laterality Date   ABDOMINAL HYSTERECTOMY     CESAREAN SECTION     COLONOSCOPY N/A 08/19/2019   Procedure: COLONOSCOPY;  Surgeon: Vida Rigger, MD;  Location: [facility];  Service: Endoscopy;  Laterality: N/A;   COLONOSCOPY WITH PROPOFOL N/A 08/05/2019   Procedure: COLONOSCOPY WITH PROPOFOL;  Surgeon: Charlott Rakes, MD;  [facility]: [facility];  Service: Gastroenterology;  Laterality: N/A;   COLONOSCOPY WITH PROPOFOL N/A 11/19/2019   Procedure: COLONOSCOPY WITH PROPOFOL;  Surgeon: Bernette Redbird, MD;  [facility];  Service: Endoscopy;  Laterality: N/A;  Unprepped   COLONOSCOPY WITH PROPOFOL N/A 11/22/2019   Procedure: COLONOSCOPY WITH PROPOFOL;  Surgeon: Bernette Redbird, MD;  [facility];  Service: Endoscopy;  Laterality: N/A;   COLONOSCOPY WITH PROPOFOL N/A 11/23/2019   Procedure: COLONOSCOPY WITH PROPOFOL;  Surgeon: Kerin Salen, MD;  [facility];  Service: Gastroenterology;  Laterality: N/A;   COLONOSCOPY WITH PROPOFOL N/A 11/29/2019   Procedure: COLONOSCOPY WITH PROPOFOL;  Surgeon: Charlott Rakes, MD;  [facility];  Service: Endoscopy;  Laterality: N/A;   ENTEROSCOPY N/A 11/24/2019   Procedure: ENTEROSCOPY;  Surgeon: Kerin Salen, MD;  [facility];  Service: Gastroenterology;  Laterality: N/A;   ENTEROSCOPY N/A 11/27/2019   Procedure: ENTEROSCOPY;  Surgeon: Charlott Rakes, MD;  [facility];  Service: Endoscopy;  Laterality: N/A;   ESOPHAGOGASTRODUODENOSCOPY N/A 11/27/2019   Procedure: ESOPHAGOGASTRODUODENOSCOPY (EGD);  Surgeon: Charlott Rakes, MD;  Location: [facility];  Service: Endoscopy;  Laterality: N/A;   ESOPHAGOGASTRODUODENOSCOPY (EGD) WITH PROPOFOL N/A 11/24/2019   Procedure: ESOPHAGOGASTRODUODENOSCOPY (EGD) WITH PROPOFOL;  Surgeon: Kerin Salen, MD;  [facility];  Service: Gastroenterology;  Laterality: N/A;  PUSH enteroscopy   GIVENS CAPSULE STUDY N/A 11/19/2019   Procedure: GIVENS CAPSULE STUDY;  Surgeon: Bernette Redbird, MD;  [facility];  Service: Endoscopy;  Laterality: N/A;  To be performed immediately following colonoscopy   GIVENS CAPSULE STUDY N/A 11/24/2019   Procedure: GIVENS CAPSULE STUDY;  Surgeon: Kerin Salen, MD;  [facility];  Service: Gastroenterology;  Laterality: N/A;   GIVENS CAPSULE STUDY N/A 11/28/2019   Procedure: GIVENS CAPSULE STUDY;  Surgeon: Charlott Rakes, MD;  [facility];  Service: Endoscopy;  Laterality: N/A;   HEMOSTASIS CLIP PLACEMENT  11/19/2019   Procedure: HEMOSTASIS CLIP PLACEMENT;  Surgeon: Bernette Redbird, MD;  [facility];  Service: Endoscopy;;   HEMOSTASIS CLIP PLACEMENT  11/22/2019   Procedure: HEMOSTASIS CLIP PLACEMENT;  Surgeon: Bernette Redbird, MD;  [facility];  Service:  Endoscopy;;   HOT HEMOSTASIS N/A 11/24/2019   Procedure: HOT HEMOSTASIS (ARGON PLASMA COAGULATION/BICAP);  Surgeon: Kerin Salen, MD;  Location: [facility];  Service: Gastroenterology;  Laterality: N/A;   HOT HEMOSTASIS N/A 11/27/2019   Procedure: HOT HEMOSTASIS (ARGON PLASMA COAGULATION/BICAP);  Surgeon: Charlott Rakes, MD;  Location: [facility];  Service: Endoscopy;  Laterality: N/A;   SUBMUCOSAL TATTOO INJECTION  11/19/2019   Procedure: SUBMUCOSAL TATTOO INJECTION;  Surgeon: Bernette Redbird, MD;  [facility];  Service: Endoscopy;;   Social History   Tobacco Use    Smoking status: Former    Current packs/day: 0.00    Average packs/day: 1 pack/day for 40.0 years (40.0 ttl pk-yrs)    Types: Cigarettes    Start date: 07/21/1972    Quit date: 10/16/2011    Years since quitting: 12.0   Smokeless tobacco: Never  Vaping Use   Vaping status: Never Used  Substance Use Topics   Alcohol use: Not Currently    Comment: Occ-- Wine   Drug use: No   Social History   Socioeconomic History   Marital status: Divorced    Spouse name: Not on file   Number of children: 4   Years of education: 14   Highest education level: Associate degree: academic program  Occupational History   Occupation: disabled- Biochemist, clinical Med  Tobacco Use   Smoking status: Former    Current packs/day: 0.00    Average packs/day: 1 pack/day for 40.0 years (40.0 ttl pk-yrs)    Types: Cigarettes    Start date: 07/21/1972    Quit date: 10/16/2011    Years since quitting: 12.0   Smokeless tobacco: Never  Vaping Use   Vaping status: Never Used  Substance and Sexual Activity   Alcohol use: Not Currently    Comment: Occ-- Wine   Drug use: No   Sexual activity: Not Currently  Other Topics Concern   Not on file  Social History Narrative      Originally from [place]. Has also lived in [place], [place]. She also previously lived in [place] for 20 years. No history of [place] Fever. Moved to [place] in 1989. 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 No pets currently. No bird exposure. Questionable previous mold exposure in her daughter's home. 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       Lives in [place] alone in assisted living.  Has multiple chronic illness.      Are you right handed or left handed? Right handed   Are you currently employed ? no   What is your current  occupation? retired   Do you live at home alone?no   Who lives with you? son   What type of home do you live in: 1 story or 2 story? Apartment first floor       Social Drivers of Health   Financial Resource Strain: Low Risk  (01/24/2022)   Overall Financial Resource Strain (CARDIA)    Difficulty of Paying Living Expenses: Not hard at all  Food Insecurity: No Food Insecurity (01/27/2023)   Hunger Vital Sign    Worried About Running Out of Food in the Last Year: Never true    Ran Out of Food in the Last Year: Never true  Transportation Needs: No Transportation Needs (  01/27/2023)   PRAPARE - Administrator, Civil Service (Medical): No    Lack of Transportation (Non-Medical): No  Physical Activity: Inactive (07/04/2023)   Exercise Vital Sign    Days of Exercise per Week: 0 days    Minutes of Exercise per Session: 0 min  Stress: Stress Concern Present (07/04/2023)   [employer] of Occupational Health - Occupational Stress Questionnaire    Feeling of Stress : To some extent  Social Connections: Unknown (11/03/2022)   [facility]   Social Network    Social Network: Not on file  Intimate Partner Violence: Not At Risk (01/27/2023)   Humiliation, Afraid, Rape, and Kick questionnaire    Fear of Current or Ex-Partner: No    Emotionally Abused: No    Physically Abused: No    Sexually Abused: No   Family Status  Relation Name Status   Mother  Deceased       in childbirth   Father  Deceased at age 12       MVP and disease acquired in [place] War   Sister  Alive   Brother  Alive   Brother  Alive   Oceanographer  (Not Specified)   Oceanographer  (Not Specified)   Oneal Grout  (Not Specified)   MGF  Deceased   MGM  Deceased   PGF  Deceased   PGM  Deceased   Son twins Alive   Son twins Alive  No partnership data on file   Family History  Problem Relation Age of Onset   Heart disease Father        MVP and Pics Valve   Hypertension Father     Depression Father        Institutionalized x's 2 years   Bipolar disorder Father    Hypertension Sister    Diabetes Sister    Hyperlipidemia Sister    Heart disease Sister 31       MI   Heart disease Brother    Hypertension Brother    Schizophrenia Paternal Aunt    Depression Paternal Aunt    Anxiety disorder Paternal Aunt    Heart disease Paternal Aunt    Schizophrenia Paternal Aunt    Heart disease Paternal Uncle    Heart disease Paternal Grandmother    Asthma Son    Asthma Son    Allergies  Allergen Reactions   Hydrocodone Other (See Comments)    Constipation and hallucinations   Norvasc [Amlodipine Besylate] Swelling and Other (See Comments)    Marked swelling of the limbs   Tizanidine Other (See Comments)    After the 3rd dose, the patient's mouth began to feel numb and she felt like she was a having a "hot flash"      Review of Systems  Constitutional:  Negative for fever and malaise/fatigue.  HENT:  Negative for congestion.   Eyes:  Negative for blurred vision.  Respiratory:  Negative for cough and shortness of breath.   Cardiovascular:  Negative for chest pain, palpitations and leg swelling.  Gastrointestinal:  Negative for abdominal pain, blood in stool, nausea and vomiting.  Genitourinary:  Negative for dysuria and frequency.  Musculoskeletal:  Negative for back pain and falls.  Skin:  Negative for rash.  Neurological:  Negative for dizziness, loss of consciousness and headaches.  Endo/Heme/Allergies:  Negative for environmental allergies.  Psychiatric/Behavioral:  Negative for depression. The patient is not nervous/anxious.       Objective:  BP (!) 160/88 (BP Location: Right Arm, Patient Position: Sitting, Cuff Size: Large)   Pulse 92   Temp 98.7 F (37.1 C) (Oral)   Resp (!) 22   Ht 5\' 3"  (1.6 m)   Wt 268 lb 6.4 oz (121.7 kg)   SpO2 95%   BMI 47.54 kg/m  BP Readings from Last 3 Encounters:  11/07/23 (!) 160/88  08/21/23 120/76  05/05/23  (!) 155/75   Wt Readings from Last 3 Encounters:  11/07/23 268 lb 6.4 oz (121.7 kg)  08/21/23 268 lb (121.6 kg)  05/05/23 269 lb (122 kg)   SpO2 Readings from Last 3 Encounters:  11/07/23 95%  08/21/23 93%  05/05/23 94%      Physical Exam Vitals and nursing note reviewed.  Constitutional:      General: She is not in acute distress.    Appearance: Normal appearance. She is well-developed.  HENT:     Head: Normocephalic and atraumatic.  Eyes:     General: No scleral icterus.       Right eye: No discharge.        Left eye: No discharge.  Cardiovascular:     Rate and Rhythm: Normal rate and regular rhythm.     Heart sounds: No murmur heard. Pulmonary:     Effort: Pulmonary effort is normal. No respiratory distress.     Breath sounds: Normal breath sounds.  Musculoskeletal:        General: Normal range of motion.     Cervical back: Normal range of motion and neck supple.     Right lower leg: No edema.     Left lower leg: No edema.  Skin:    General: Skin is warm and dry.  Neurological:     Mental Status: She is alert and oriented to person, place, and time.  Psychiatric:        Mood and Affect: Mood normal.        Behavior: Behavior normal.        Thought Content: Thought content normal.        Judgment: Judgment normal.      Results for orders placed or performed in visit on 11/07/23  Lipid panel  Result Value Ref Range   Cholesterol 86 0 - 200 mg/dL   Triglycerides 16.1 0.0 - 149.0 mg/dL   HDL 09.60 (L) >45.40 mg/dL   VLDL 98.1 0.0 - 19.1 mg/dL   LDL Cholesterol 33 0 - 99 mg/dL   Total CHOL/HDL Ratio 2    NonHDL 50.44   CBC with Differential/Platelet  Result Value Ref Range   WBC 6.5 4.0 - 10.5 K/uL   RBC 3.87 3.87 - 5.11 Mil/uL   Hemoglobin 12.1 12.0 - 15.0 g/dL   HCT 47.8 29.5 - 62.1 %   MCV 95.8 78.0 - 100.0 fl   MCHC 32.7 30.0 - 36.0 g/dL   RDW 30.8 65.7 - 84.6 %   Platelets 156.0 150.0 - 400.0 K/uL   Neutrophils Relative % 66.2 43.0 - 77.0 %    Lymphocytes Relative 22.7 12.0 - 46.0 %   Monocytes Relative 7.0 3.0 - 12.0 %   Eosinophils Relative 3.2 0.0 - 5.0 %   Basophils Relative 0.9 0.0 - 3.0 %   Neutro Abs 4.3 1.4 - 7.7 K/uL   Lymphs Abs 1.5 0.7 - 4.0 K/uL   Monocytes Absolute 0.5 0.1 - 1.0 K/uL   Eosinophils Absolute 0.2 0.0 - 0.7 K/uL   Basophils Absolute 0.1 0.0 - 0.1 K/uL  Comprehensive metabolic panel  Result Value Ref Range   Sodium 139 135 - 145 mEq/L   Potassium 2.8 (LL) 3.5 - 5.1 mEq/L   Chloride 92 (L) 96 - 112 mEq/L   CO2 37 (H) 19 - 32 mEq/L   Glucose, Bld 230 (H) 70 - 99 mg/dL   BUN 22 6 - 23 mg/dL   Creatinine, Ser 1.61 (H) 0.40 - 1.20 mg/dL   Total Bilirubin 0.6 0.2 - 1.2 mg/dL   Alkaline Phosphatase 97 39 - 117 U/L   AST 19 0 - 37 U/L   ALT 12 0 - 35 U/L   Total Protein 8.8 (H) 6.0 - 8.3 g/dL   Albumin 3.5 3.5 - 5.2 g/dL   GFR 09.60 (L) >45.40 mL/min   Calcium 9.2 8.4 - 10.5 mg/dL  Hemoglobin J8J  Result Value Ref Range   Hgb A1c MFr Bld 10.3 (H) 4.6 - 6.5 %  TSH  Result Value Ref Range   TSH 3.26 0.35 - 5.50 uIU/mL  Microalbumin / creatinine urine ratio  Result Value Ref Range   Microalb, Ur <0.7 0.0 - 1.9 mg/dL   Creatinine,U 19.1 mg/dL   Microalb Creat Ratio 8.1 0.0 - 30.0 mg/g    Last CBC Lab Results  Component Value Date   WBC 6.5 11/07/2023   HGB 12.1 11/07/2023   HCT 37.1 11/07/2023   MCV 95.8 11/07/2023   MCH 32.3 12/23/2022   RDW 14.6 11/07/2023   PLT 156.0 11/07/2023   Last metabolic panel Lab Results  Component Value Date   GLUCOSE 230 (H) 11/07/2023   NA 139 11/07/2023   K 2.8 (LL) 11/07/2023   CL 92 (L) 11/07/2023   CO2 37 (H) 11/07/2023   BUN 22 11/07/2023   CREATININE 1.91 (H) 11/07/2023   GFR 26.11 (L) 11/07/2023   CALCIUM 9.2 11/07/2023   PHOS 1.9 (L) 11/20/2019   PROT 8.8 (H) 11/07/2023   ALBUMIN 3.5 11/07/2023   BILITOT 0.6 11/07/2023   ALKPHOS 97 11/07/2023   AST 19 11/07/2023   ALT 12 11/07/2023   ANIONGAP 8 12/23/2022   Last lipids Lab Results   Component Value Date   CHOL 86 11/07/2023   HDL 35.50 (L) 11/07/2023   LDLCALC 33 11/07/2023   TRIG 88.0 11/07/2023   CHOLHDL 2 11/07/2023   Last hemoglobin A1c Lab Results  Component Value Date   HGBA1C 10.3 (H) 11/07/2023   Last thyroid functions Lab Results  Component Value Date   TSH 3.26 11/07/2023   T3TOTAL 69 (L) 09/01/2022   Last vitamin D No results found for: "25OHVITD2", "25OHVITD3", "VD25OH" Last vitamin B12 and Folate Lab Results  Component Value Date   VITAMINB12 448 01/07/2020    The ASCVD Risk score (Arnett DK, et al., 2019) failed to calculate for the following reasons:   Risk score cannot be calculated because patient has a medical history suggesting prior/existing ASCVD    Assessment & Plan:   Problem List Items Addressed This Visit       Unprioritized   Need for influenza vaccination   Relevant Orders   Flu Vaccine Trivalent High Dose (Fluad) (Completed)   Neuropathy   Relevant Orders   AMB Referral VBCI Care Management   Hypokalemia - Primary   Relevant Orders   AMB Referral VBCI Care Management   Comprehensive metabolic panel (Completed)   Chronic respiratory failure (HCC) (Chronic)   Relevant Orders   AMB Referral VBCI Care Management   Hypothyroidism (Chronic)   Relevant Orders  AMB Referral VBCI Care Management   TSH (Completed)   Chronic diastolic heart failure (HCC)   Stable Per cardiology      Relevant Orders   AMB Referral VBCI Care Management   CKD (chronic kidney disease) stage 3, GFR 30-59 ml/min (HCC) (Chronic)   Check labs today Per nephrology      Relevant Orders   AMB Referral VBCI Care Management   Lipid panel (Completed)   CBC with Differential/Platelet (Completed)   Comprehensive metabolic panel (Completed)   Essential hypertension (Chronic)   Elevated today Runs lower at home  BP Readings from Last 3 Encounters:  11/07/23 (!) 160/88  08/21/23 120/76  05/05/23 (!) 155/75         Hyperlipidemia  associated with type 2 diabetes mellitus (HCC)   Encourage heart healthy diet such as MIND or DASH diet, increase exercise, avoid trans fats, simple carbohydrates and processed foods, consider a krill or fish or flaxseed oil cap daily.        Relevant Orders   AMB Referral VBCI Care Management   Lipid panel (Completed)   CBC with Differential/Platelet (Completed)   Comprehensive metabolic panel (Completed)   Pulmonary emphysema (HCC)   On O2 Per pulmonary      Other Visit Diagnoses       Type 2 diabetes mellitus with hyperglycemia, unspecified whether long term insulin use (HCC)       Relevant Orders   AMB Referral VBCI Care Management   Lipid panel (Completed)   CBC with Differential/Platelet (Completed)   Comprehensive metabolic panel (Completed)   Hemoglobin A1c (Completed)   Microalbumin / creatinine urine ratio (Completed)     Fall, initial encounter       Relevant Orders   DG Cervical Spine Complete   Ambulatory referral to Home Health     Assessment and Plan    Fall with Neck Pain   Reports falling last Thursday, hitting her head on a coffee table, with persistent neck pain. Emphasized the importance of evaluation after head trauma and potential benefits of physical therapy for balance and pain management. Order neck X-ray and refer to physical therapy.  Type 2 Diabetes Mellitus   A1c was 13.5% in December. Currently on insulin, Trulicity, and Jardiance. Missed recent specialist appointments. Emphasized medication adherence and regular follow-ups. Informed about pharmacist Tammy for medication management. Check A1c today, reschedule appointment with diabetes specialist, and discuss medication management with pharmacist Tammy.  Hypertension   Elevated blood pressure today, usually runs low. Attributed to rushing. Advised relaxation techniques. Monitor blood pressure and continue relaxation techniques.  General Health Maintenance   Has not received flu vaccination.  Discussed its importance in preventing complications. Administer flu shot today.  Follow-up   Perform lab tests today, provide urine cup for sample collection, and ensure follow-up with physical therapy if not contacted.        Return in about 3 months (around 02/04/2024), or if symptoms worsen or fail to improve.    Donato Schultz, DO

## 2023-11-08 ENCOUNTER — Encounter: Payer: Self-pay | Admitting: Family Medicine

## 2023-11-08 ENCOUNTER — Telehealth: Payer: Self-pay

## 2023-11-08 ENCOUNTER — Ambulatory Visit: Payer: Medicare PPO | Admitting: Neurology

## 2023-11-08 LAB — CBC WITH DIFFERENTIAL/PLATELET
Basophils Absolute: 0.1 10*3/uL (ref 0.0–0.1)
Basophils Relative: 0.9 % (ref 0.0–3.0)
Eosinophils Absolute: 0.2 10*3/uL (ref 0.0–0.7)
Eosinophils Relative: 3.2 % (ref 0.0–5.0)
HCT: 37.1 % (ref 36.0–46.0)
Hemoglobin: 12.1 g/dL (ref 12.0–15.0)
Lymphocytes Relative: 22.7 % (ref 12.0–46.0)
Lymphs Abs: 1.5 10*3/uL (ref 0.7–4.0)
MCHC: 32.7 g/dL (ref 30.0–36.0)
MCV: 95.8 fL (ref 78.0–100.0)
Monocytes Absolute: 0.5 10*3/uL (ref 0.1–1.0)
Monocytes Relative: 7 % (ref 3.0–12.0)
Neutro Abs: 4.3 10*3/uL (ref 1.4–7.7)
Neutrophils Relative %: 66.2 % (ref 43.0–77.0)
Platelets: 156 10*3/uL (ref 150.0–400.0)
RBC: 3.87 Mil/uL (ref 3.87–5.11)
RDW: 14.6 % (ref 11.5–15.5)
WBC: 6.5 10*3/uL (ref 4.0–10.5)

## 2023-11-08 LAB — LIPID PANEL
Cholesterol: 86 mg/dL (ref 0–200)
HDL: 35.5 mg/dL — ABNORMAL LOW (ref >39.00)
LDL Cholesterol: 33 mg/dL (ref 0–99)
NonHDL: 50.44
Total CHOL/HDL Ratio: 2
Triglycerides: 88 mg/dL (ref 0.0–149.0)
VLDL: 17.6 mg/dL (ref 0.0–40.0)

## 2023-11-08 LAB — COMPREHENSIVE METABOLIC PANEL
ALT: 12 U/L (ref 0–35)
AST: 19 U/L (ref 0–37)
Albumin: 3.5 g/dL (ref 3.5–5.2)
Alkaline Phosphatase: 97 U/L (ref 39–117)
BUN: 22 mg/dL (ref 6–23)
CO2: 37 meq/L — ABNORMAL HIGH (ref 19–32)
Calcium: 9.2 mg/dL (ref 8.4–10.5)
Chloride: 92 meq/L — ABNORMAL LOW (ref 96–112)
Creatinine, Ser: 1.91 mg/dL — ABNORMAL HIGH (ref 0.40–1.20)
GFR: 26.11 mL/min — ABNORMAL LOW (ref >60.00)
Glucose, Bld: 230 mg/dL — ABNORMAL HIGH (ref 70–99)
Potassium: 2.8 meq/L — CL (ref 3.5–5.1)
Sodium: 139 meq/L (ref 135–145)
Total Bilirubin: 0.6 mg/dL (ref 0.2–1.2)
Total Protein: 8.8 g/dL — ABNORMAL HIGH (ref 6.0–8.3)

## 2023-11-08 LAB — HEMOGLOBIN A1C: Hgb A1c MFr Bld: 10.3 % — ABNORMAL HIGH (ref 4.6–6.5)

## 2023-11-08 LAB — MICROALBUMIN / CREATININE URINE RATIO
Creatinine,U: 86.1 mg/dL
Microalb Creat Ratio: 8.1 mg/g (ref 0.0–30.0)
Microalb, Ur: <0.7 mg/dL (ref 0.0–1.9)

## 2023-11-08 LAB — TSH: TSH: 3.26 u[IU]/mL (ref 0.35–5.50)

## 2023-11-08 NOTE — Assessment & Plan Note (Signed)
 Check labs today Per nephrology

## 2023-11-08 NOTE — Assessment & Plan Note (Signed)
 Encourage heart healthy diet such as MIND or DASH diet, increase exercise, avoid trans fats, simple carbohydrates and processed foods, consider a krill or fish or flaxseed oil cap daily.

## 2023-11-08 NOTE — Assessment & Plan Note (Signed)
 Elevated today Runs lower at home  BP Readings from Last 3 Encounters:  11/07/23 (!) 160/88  08/21/23 120/76  05/05/23 (!) 155/75

## 2023-11-08 NOTE — Telephone Encounter (Signed)
 Pt called. Phone rang multiple times and then went to dead air. Unable to leave VM

## 2023-11-08 NOTE — Assessment & Plan Note (Signed)
Stable Per cardiology 

## 2023-11-08 NOTE — Patient Instructions (Signed)

## 2023-11-08 NOTE — Telephone Encounter (Signed)
 CRITICAL VALUE STICKER  CRITICAL VALUE: Potassium 2.8

## 2023-11-08 NOTE — Telephone Encounter (Signed)
 Noted. Can we reach out and see how she is doing? Also can we verify she is taking potassium supplementation that Dr. Laury Axon has sent in? If she is, I want her taking 40 mEq (2 tabs) twice daily for the next 3 days and then go back to what Dr. Laury Axon has instructed. Let's recheck tomorrow or Friday. Add a magnesium as well please. Thx.

## 2023-11-08 NOTE — Assessment & Plan Note (Signed)
On O2 Per pulmonary 

## 2023-11-09 ENCOUNTER — Telehealth: Payer: Self-pay

## 2023-11-09 NOTE — Progress Notes (Signed)
 Complex Care Management Note Care Guide Note  11/09/2023 Name: Sue [name] MRN: [id number] DOB: Apr 06, 1951   Complex Care Management Outreach Attempts: An unsuccessful telephone outreach was attempted today to offer the patient information about available complex care management services.  Follow Up Plan:  Additional outreach attempts will be made to offer the patient complex care management information and services.   Encounter Outcome:  No Answer  Leaira Fullam [facility]  [facility] Guide Direct Dial: [phone]  Fax: [phone] Website: [email or web address]

## 2023-11-11 ENCOUNTER — Other Ambulatory Visit: Payer: Self-pay | Admitting: Behavioral Health

## 2023-11-11 DIAGNOSIS — F411 Generalized anxiety disorder: Secondary | ICD-10-CM

## 2023-11-11 DIAGNOSIS — F5105 Insomnia due to other mental disorder: Secondary | ICD-10-CM

## 2023-11-13 ENCOUNTER — Other Ambulatory Visit: Payer: Self-pay | Admitting: Neurology

## 2023-11-13 ENCOUNTER — Telehealth: Payer: Self-pay | Admitting: Neurology

## 2023-11-13 DIAGNOSIS — E1142 Type 2 diabetes mellitus with diabetic polyneuropathy: Secondary | ICD-10-CM

## 2023-11-13 DIAGNOSIS — R209 Unspecified disturbances of skin sensation: Secondary | ICD-10-CM

## 2023-11-13 NOTE — Telephone Encounter (Signed)
 Pt is out of medication and needs it asap. Needs be before 5 pm today

## 2023-11-13 NOTE — Telephone Encounter (Signed)
 Pt called at 11:30a requesting refill of Zolpidem and Lorazepam to   [id number] - [address], [place] [address] Phone: [phone]  Fax: [phone]   Pt said she has been without meds for 3 days.  She thought it had automatically been sent in.  No upcoming appt scheduled.

## 2023-11-13 NOTE — Telephone Encounter (Signed)
 1. Which medications need refilled? (List name and dosage, if known) pregabalin  2. Which pharmacy/location is medication to be sent to? (include street and city if local pharmacy) [facility] [address]

## 2023-11-14 ENCOUNTER — Telehealth: Payer: Self-pay

## 2023-11-14 NOTE — Progress Notes (Signed)
 Complex Care Management Note Care Guide Note  11/14/2023 Name: Sue [name] MRN: [id number] DOB: October 12, 1950   Complex Care Management Outreach Attempts: A second unsuccessful outreach was attempted today to offer the patient with information about available complex care management services.  Follow Up Plan:  Additional outreach attempts will be made to offer the patient complex care management information and services.   Encounter Outcome:  No Answer  Rosaisela Jamroz [facility]  [facility] Guide Direct Dial: [phone]  Fax: [phone] Website: [email or web address]

## 2023-11-14 NOTE — Telephone Encounter (Signed)
 Called pt and reported DR. Hill refilled [place]. She was thankful/.

## 2023-11-15 ENCOUNTER — Telehealth: Payer: Self-pay

## 2023-11-15 ENCOUNTER — Telehealth: Payer: Self-pay | Admitting: *Deleted

## 2023-11-15 NOTE — Telephone Encounter (Signed)
 Patient was identified as falling into the [place] Measure - Diabetes.   Patient was: Appointment scheduled for lab or office visit for A1c.   Most recent A1c completed on 11/07/23 with pcp.  Pt to follow up with Endocrinology.

## 2023-11-15 NOTE — Progress Notes (Signed)
 Complex Care Management Note Care Guide Note  11/15/2023 Name: Sue [name] MRN: [id number] DOB: 1950-10-17   Complex Care Management Outreach Attempts: A third unsuccessful outreach was attempted today to offer the patient with information about available complex care management services.  Follow Up Plan:  No further outreach attempts will be made at this time. We have been unable to contact the patient to offer or enroll patient in complex care management services.  Encounter Outcome:  No Answer  Vaden Becherer [facility]  [facility] Guide Direct Dial: [phone]  Fax: [phone] Website: [email or web address]

## 2023-11-16 ENCOUNTER — Telehealth: Payer: Self-pay

## 2023-11-16 ENCOUNTER — Telehealth: Payer: Self-pay | Admitting: *Deleted

## 2023-11-16 NOTE — Telephone Encounter (Signed)
 Copied from CRM [id number]. Topic: General - Other >> Nov 16, 2023 12:00 PM Gurney Maxin H wrote: Reason for CRM: Lorayne Marek is calling to update provider on start of care visit for patient, initially was supposed to start on Monday 3/3 , but patient requested to delay to Tuesday but had to have an emergency dental procedure and now start of care visit will be on Monday 3/10.

## 2023-11-16 NOTE — Progress Notes (Signed)
 Care Guide Pharmacy Note  11/16/2023 Name: Sue [name] MRN: [id number] DOB: March 02, 1951  Referred By: Donato Schultz, DO Reason for referral: Care Coordination (Outreach to schedule with Pharm d )   Sue [name] is a 73 y.o. year old female who is a primary care patient of Donato Schultz, DO.  Sue [name] was referred to the pharmacist for assistance related to: DMII  An unsuccessful telephone outreach was attempted today to contact the patient who was referred to the pharmacy team for assistance with medication management. Additional attempts will be made to contact the patient.  Penne Lash , RMA     [facility]  [facility], [facility] Guide  Direct Dial: [phone]  Website: [email or web address]

## 2023-11-16 NOTE — Telephone Encounter (Signed)
 FYI

## 2023-11-21 ENCOUNTER — Ambulatory Visit: Payer: Medicare PPO | Admitting: Internal Medicine

## 2023-11-23 ENCOUNTER — Telehealth: Payer: Self-pay | Admitting: Family Medicine

## 2023-11-23 NOTE — Telephone Encounter (Signed)
 Copied from CRM [id number]. Topic: Medicare AWV >> Nov 23, 2023  1:37 PM Payton Doughty wrote: Reason for CRM: Called LVM 11/23/2023 to schedule AWV. Please schedule office or virtual visits  Verlee Rossetti; Care Guide Ambulatory Clinical Support [facility] Direct Dial: [phone]

## 2023-11-25 ENCOUNTER — Encounter: Payer: Self-pay | Admitting: Family Medicine

## 2023-12-04 NOTE — Progress Notes (Unsigned)
 Care Guide Pharmacy Note  12/04/2023 Name: Sue [name] MRN: [id number] DOB: [birth year]  Referred By: Donato Schultz, DO Reason for referral: Care Coordination (Outreach to schedule with Pharm d )   Sue [name] is a 73 y.o. year old female who is a primary care patient of Donato Schultz, DO.  Sue [name] was referred to the pharmacist for assistance related to: DMII  A second unsuccessful telephone outreach was attempted today to contact the patient who was referred to the pharmacy team for assistance with medication management. Additional attempts will be made to contact the patient.  Penne Lash , RMA     [facility]  [facility], [facility] Guide  Direct Dial: [phone]  Website: [email or web address]

## 2023-12-05 NOTE — Progress Notes (Signed)
 Care Guide Pharmacy Note  12/05/2023 Name: [name] MRN: [id number] DOB: [birth year]  Referred By: Donato Schultz, DO Reason for referral: Care Coordination (Outreach to schedule with Pharm d )   Sue [name] is a 73 y.o. year old female who is a primary care patient of Donato Schultz, DO.  [name] was referred to the pharmacist for assistance related to: DMII  Successful contact was made with the patient to discuss pharmacy services including being ready for the pharmacist to call at least 5 minutes before the scheduled appointment time and to have medication bottles and any blood pressure readings ready for review. The patient agreed to meet with the pharmacist via telephone visit on (date/time).12/18/2023  Penne Lash , RMA     [place]  [facility], [facility] Guide  Direct Dial: [phone]  Website: [email or web address]

## 2023-12-18 ENCOUNTER — Telehealth

## 2023-12-18 ENCOUNTER — Telehealth: Payer: Self-pay | Admitting: Pharmacist

## 2023-12-18 NOTE — Telephone Encounter (Signed)
 Attempt was made to contact patient by phone today for follow up by Clinical Pharmacist regarding medication adherence hypertension and diabetes.  Unable to reach patient. LM on VM with my contact number [phone] or office number [phone]

## 2023-12-19 ENCOUNTER — Ambulatory Visit: Admitting: Family Medicine

## 2023-12-27 ENCOUNTER — Ambulatory Visit: Payer: Self-pay

## 2023-12-27 NOTE — Telephone Encounter (Signed)
  Chief Complaint: suspect oral thrush Symptoms: white patches in mouth, inside of mouth is peeling, loose stool, decreased appetite Frequency: x 1 day Pertinent Negatives: Patient denies difficulty breathing, bleeding from mouth,  Disposition: [] ED /[] Urgent Care (no appt availability in office) / [x] Appointment(In office/virtual)/ []  [place] [facility]/ [] Home Care/ [] Refused Recommended Disposition /[] [facility]/ []  Follow-up with PCP Additional Notes: Daughter, Justine Oms, on phone for triage. She states she normally gives the patient rides for appts but she works a full time job. She requested if patient can have a virtual appointment with her PCP. Virtual visit set up and explained log in with daughter. Advised to call back for any new or worsening symptoms.  Copied From CRM [id number]. Reason for Triage: Patient's daughter Ruthanne Covey called stating the patient is currently experiencing oral thrush. She is inquiring about scheduling a telehealth visit with the patient's PCP to address the issue.  Daughter Callback Number:  [phone]   Reason for Disposition  [1] White patches that stick to tongue or inner cheek AND [2] can be wiped off  Answer Assessment - Initial Assessment Questions 1. SYMPTOM: "What's the main symptom you're concerned about?" (e.g., chapped lips, dry mouth, lump, sores)     White patches inside of mouth and lining of mouth feels like it is peeling.  2. ONSET: "When did the  symptoms  start?"     Yesterday. Today patient told her daughter it has improved, she is trying to not drink anything acidic.  3. PAIN: "Is there any pain?" If Yes, ask: "How bad is it?" (Scale: 1-10; mild, moderate, severe)   - MILD (1-3):  doesn't interfere with eating or normal activities   - MODERATE (4-7): interferes with eating some solids and normal activities   - SEVERE (8-10):  excruciating pain, interferes with most normal activities   - SEVERE DYSPHAGIA: can't  swallow liquids, drooling     Yes, moderate.  4. CAUSE: "What do you think is causing the symptoms?"     Thought it may be oral thrush, as she has had this before and she get the magic mouthwash.  5. OTHER SYMPTOMS: "Do you have any other symptoms?" (e.g., fever, sore throat, toothache, swelling)     Loose stool that daughter states just started, poor appetite.  6. PREGNANCY: "Is there any chance you are pregnant?" "When was your last menstrual period?"     N/A.  Protocols used: Mouth Symptoms-A-AH

## 2023-12-28 ENCOUNTER — Telehealth (INDEPENDENT_AMBULATORY_CARE_PROVIDER_SITE_OTHER): Admitting: Family Medicine

## 2023-12-28 DIAGNOSIS — B370 Candidal stomatitis: Secondary | ICD-10-CM | POA: Diagnosis not present

## 2023-12-28 MED ORDER — MAGIC MOUTHWASH W/LIDOCAINE: 5.0000 mL | Freq: Four times a day (QID) | ORAL | 0 refills | Status: DC | PRN

## 2023-12-28 NOTE — Telephone Encounter (Signed)
 Copied from CRM [id number]. Topic: Clinical - Prescription Issue >> Dec 28, 2023 11:19 AM Howard Macho wrote: Reason for CRM: [facility] called stating they received a prescription for the magic mouthwash w/lidocaine SOLN for the patient but they do not do compunds there CB [phone]

## 2023-12-28 NOTE — Progress Notes (Signed)
 Virtual telephone visit    Virtual Visit via Telephone Note   This visit type was conducted due to national recommendations for restrictions regarding the COVID-19 Pandemic (e.g. social distancing) in an effort to limit this patient's exposure and mitigate transmission in our community. Due to her co-morbid illnesses, this patient is at least at moderate risk for complications without adequate follow up. This format is felt to be most appropriate for this patient at this time. The patient did not have access to video technology or had technical difficulties with video requiring transitioning to audio format only (telephone). Physical exam was limited to content and character of the telephone converstion. [name]  was able to get the patient set up on a telephone visit.  Video connection was lost at < 50% of the duration of the visit, at which time the remainder of the visit was completed via audio only.  Patient location: home  Patient and provider in visit Provider location: Office  I discussed the limitations of evaluation and management by telemedicine and the availability of in person appointments. The patient expressed understanding and agreed to proceed.   Visit Date: 12/28/2023  Today's healthcare provider: Donato Schultz, DO     Subjective:    Patient ID: Sue [name], female    DOB: October 08, 1950, 73 y.o.   MRN: [id number]  Chief Complaint  Patient presents with   Oral Pain    HPI Patient is in today for c/o of thrush.  Pt was unable to get video to work.  We had to transition to telephone.   Discussed the use of AI scribe software for clinical note transcription with the patient, who gave verbal consent to proceed.  History of Present Illness  Pt c/o red sore tongue with white film.  No changes in meds.  She is on no steroids.  Pt was unable to get a ride here and unable to get the video to work.   History of Present Illness        Past Medical  History:  Diagnosis Date   Allergic rhinitis    Depression    Diabetes mellitus type 2, uncontrolled    Emphysema of lung (HCC)    3L home O2   GERD (gastroesophageal reflux disease)    Hypertension    Hypothyroidism    Obesity, morbid, BMI 50 or higher (HCC)    Stroke (HCC) 2016   TIA    Urine incontinence     Past Surgical History:  Procedure Laterality Date   ABDOMINAL HYSTERECTOMY     CESAREAN SECTION     COLONOSCOPY N/A 08/19/2019   Procedure: COLONOSCOPY;  Surgeon: Vida Rigger, MD;  Location: [facility];  Service: Endoscopy;  Laterality: N/A;   COLONOSCOPY WITH PROPOFOL N/A 08/05/2019   Procedure: COLONOSCOPY WITH PROPOFOL;  Surgeon: Charlott Rakes, MD;  Location: [facility];  Service: Gastroenterology;  Laterality: N/A;   COLONOSCOPY WITH PROPOFOL N/A 11/19/2019   Procedure: COLONOSCOPY WITH PROPOFOL;  Surgeon: Bernette Redbird, MD;  Location: [facility];  Service: Endoscopy;  Laterality: N/A;  Unprepped   COLONOSCOPY WITH PROPOFOL N/A 11/22/2019   Procedure: COLONOSCOPY WITH PROPOFOL;  Surgeon: Bernette Redbird, MD;  [facility];  Service: Endoscopy;  Laterality: N/A;   COLONOSCOPY WITH PROPOFOL N/A 11/23/2019   Procedure: COLONOSCOPY WITH PROPOFOL;  Surgeon: Kerin Salen, MD;  [facility];  Service: Gastroenterology;  Laterality: N/A;   COLONOSCOPY WITH PROPOFOL N/A 11/29/2019   Procedure: COLONOSCOPY WITH PROPOFOL;  Surgeon: Bosie Clos,  Gayl Katos, MD;  Location: [facility];  Service: Endoscopy;  Laterality: N/A;   ENTEROSCOPY N/A 11/24/2019   Procedure: ENTEROSCOPY;  Surgeon: Genell Ken, MD;  [facility]: [facility];  Service: Gastroenterology;  Laterality: N/A;   ENTEROSCOPY N/A 11/27/2019   Procedure: ENTEROSCOPY;  Surgeon: Baldo Bonds, MD;  [facility]: [facility];  Service: Endoscopy;  Laterality: N/A;   ESOPHAGOGASTRODUODENOSCOPY N/A 11/27/2019   Procedure: ESOPHAGOGASTRODUODENOSCOPY (EGD);  Surgeon: Baldo Bonds, MD;  Location: [facility];  Service: Endoscopy;  Laterality: N/A;   ESOPHAGOGASTRODUODENOSCOPY (EGD) WITH PROPOFOL N/A 11/24/2019   Procedure: ESOPHAGOGASTRODUODENOSCOPY (EGD) WITH PROPOFOL;  Surgeon: Genell Ken, MD;  [facility];  Service: Gastroenterology;  Laterality: N/A;  PUSH enteroscopy   GIVENS CAPSULE STUDY N/A 11/19/2019   Procedure: GIVENS CAPSULE STUDY;  Surgeon: Lanita Pitman, MD;  [facility];  Service: Endoscopy;  Laterality: N/A;  To be performed immediately following colonoscopy   GIVENS CAPSULE STUDY N/A 11/24/2019   Procedure: GIVENS CAPSULE STUDY;  Surgeon: Genell Ken, MD;  [facility];  Service: Gastroenterology;  Laterality: N/A;   GIVENS CAPSULE STUDY N/A 11/28/2019   Procedure: GIVENS CAPSULE STUDY;  Surgeon: Baldo Bonds, MD;  [facility];  Service: Endoscopy;  Laterality: N/A;   HEMOSTASIS CLIP PLACEMENT  11/19/2019   Procedure: HEMOSTASIS CLIP PLACEMENT;  Surgeon: Lanita Pitman, MD;  [facility];  Service: Endoscopy;;   HEMOSTASIS CLIP PLACEMENT  11/22/2019   Procedure: HEMOSTASIS CLIP PLACEMENT;  Surgeon: Lanita Pitman, MD;  [facility];  Service: Endoscopy;;   HOT HEMOSTASIS N/A 11/24/2019   Procedure: HOT HEMOSTASIS (ARGON PLASMA COAGULATION/BICAP);  Surgeon: Genell Ken, MD;  Location: [facility];  Service: Gastroenterology;  Laterality: N/A;   HOT HEMOSTASIS N/A 11/27/2019   Procedure: HOT HEMOSTASIS (ARGON PLASMA COAGULATION/BICAP);  Surgeon: Baldo Bonds, MD;  Location: [facility];  Service: Endoscopy;  Laterality: N/A;   SUBMUCOSAL TATTOO INJECTION  11/19/2019   Procedure: SUBMUCOSAL TATTOO INJECTION;  Surgeon: Lanita Pitman, MD;  [facility]: [facility];  Service: Endoscopy;;    Family History  Problem Relation Age of Onset   Heart disease Father        MVP and Pics Valve   Hypertension Father    Depression Father        Institutionalized x's 2 years   Bipolar disorder Father    Hypertension  Sister    Diabetes Sister    Hyperlipidemia Sister    Heart disease Sister 97       MI   Heart disease Brother    Hypertension Brother    Schizophrenia Paternal Aunt    Depression Paternal Aunt    Anxiety disorder Paternal Aunt    Heart disease Paternal Aunt    Schizophrenia Paternal Aunt    Heart disease Paternal Uncle    Heart disease Paternal Grandmother    Asthma Son    Asthma Son     Social History   Socioeconomic History   Marital status: Divorced    Spouse name: Not on file   Number of children: 4   Years of education: 14   Highest education level: Associate degree: academic program  Occupational History   Occupation: disabled- Biochemist, clinical Med  Tobacco Use   Smoking status: Former    Current packs/day: 0.00    Average packs/day: 1 pack/day for 40.0 years (40.0 ttl pk-yrs)    Types: Cigarettes    Start date: 07/21/1972    Quit date: 10/16/2011    Years since quitting: 12.2   Smokeless tobacco: Never  Vaping Use   Vaping status: Never Used  Substance and Sexual Activity   Alcohol use: Not Currently    Comment: Occ-- Wine   Drug use: No   Sexual activity: Not Currently  Other Topics Concern   Not on file  Social History Narrative      Originally from [place]. Has also lived in [place], [place]. She also previously lived in [place] for 20 years. No history of Valley Fever. Moved to [place] in 1989. 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 No pets currently. No bird exposure. Questionable previous mold exposure in her daughter's home. 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       Lives in [place] alone in assisted living.  Has multiple chronic illness.      Are you right handed or left handed? Right handed   Are you currently employed ? no    What is your current occupation? retired   Do you live at home alone?no   Who lives with you? son   What type of home do you live in: 1 story or 2 story? Apartment first floor       Social Drivers of Health   Financial Resource Strain: Low Risk  (01/24/2022)   Overall Financial Resource Strain (CARDIA)    Difficulty of Paying Living Expenses: Not hard at all  Food Insecurity: No Food Insecurity (01/27/2023)   Hunger Vital Sign    Worried About Running Out of Food in the Last Year: Never true    Ran Out of Food in the Last Year: Never true  Transportation Needs: No Transportation Needs (01/27/2023)   PRAPARE - Administrator, Civil Service (Medical): No    Lack of Transportation (Non-Medical): No  Physical Activity: Inactive (07/04/2023)   Exercise Vital Sign    Days of Exercise per Week: 0 days    Minutes of Exercise per Session: 0 min  Stress: Stress Concern Present (07/04/2023)   [employer] of Occupational Health - Occupational Stress Questionnaire    Feeling of Stress : To some extent  Social Connections: Unknown (11/03/2022)   [facility]   Social Network    Social Network: Not on file  Intimate Partner Violence: Not At Risk (01/27/2023)   Humiliation, Afraid, Rape, and Kick questionnaire    Fear of Current or Ex-Partner: No    Emotionally Abused: No    Physically Abused: No    Sexually Abused: No    Outpatient Medications Prior to Visit  Medication Sig Dispense Refill   Accu-Chek Softclix Lancets lancets USE 1  TO CHECK GLUCOSE 4 TIMES DAILY 100 each 2   albuterol (VENTOLIN HFA) 108 (90 Base) MCG/ACT inhaler Inhale 1-2 puffs into the lungs every 6 (six) hours as needed for wheezing or shortness of breath. 1 each 0   allopurinol (ZYLOPRIM) 100 MG tablet Take 100 mg by mouth daily as needed (for gout flareups).     amLODipine (NORVASC) 5 MG tablet Take by mouth.     aspirin EC 81 MG tablet Take by mouth.     Blood Glucose  Monitoring Suppl (ACCU-CHEK GUIDE) w/Device KIT USE   TO CHECK GLUCOSE UP TO 4 TIMES DAILY AS DIRECTED 1 kit 0   busPIRone (BUSPAR)  15 MG tablet Take 1 tablet (15 mg total) by mouth 2 (two) times daily. 60 tablet 3   diclofenac Sodium (VOLTAREN ARTHRITIS PAIN) 1 % GEL Apply 4 g topically 4 (four) times daily. 150 g 2   Dulaglutide (TRULICITY) 3 MG/0.5ML SOAJ Inject 3 mg into the skin once a week. 6 mL 3   empagliflozin (JARDIANCE) 10 MG TABS tablet Take 1 tablet (10 mg total) by mouth daily before breakfast. 90 tablet 3   famotidine (PEPCID) 20 MG tablet Take 20 mg by mouth daily as needed for heartburn or indigestion.     fluticasone (FLONASE) 50 MCG/ACT nasal spray 2 sprays.     gabapentin (NEURONTIN) 100 MG capsule Take by mouth.     glucose blood (ACCU-CHEK GUIDE) test strip USE 1 STRIP TO CHECK GLUCOSE 4 TIMES DAILY 200 each 12   ibuprofen (ADVIL) 800 MG tablet Take by mouth.     insulin aspart (NOVOLOG FLEXPEN) 100 UNIT/ML FlexPen Novolog 10 units with each meal PLUS the scale if needed -Novolog correctional insulin: ADD extra units on insulin to your meal-time Novolog dose if your blood sugars are higher than 160. Use the scale below to help guide you:  Blood sugar before meal Number of units to inject Less than 160 0 unit 161 -  190 1 units 191 -  220 2 units 221 -  250 3 units 251 -  280 4 units 281 -  310 5 units 311 -  340 6 units 341 -  370 7 units 371 -  400 8 units Max daily 45 units 45 mL 3   insulin glargine, 1 Unit Dial, (TOUJEO SOLOSTAR) 300 UNIT/ML Solostar Pen INJECT 64 UNITS SUBCUTANEOUSLY ONCE DAILY IN THE AFTERNOON.**APPOINTMENT NEEDED FOR FURTHER REFILLS** 6 mL 1   Insulin Pen Needle (BD PEN NEEDLE NANO U/F) 32G X 4 MM MISC Use to inject insulin 200 each 3   ketorolac (ACULAR) 0.5 % ophthalmic solution Place one drop into the left eye 4 (four) times daily.     levothyroxine (SYNTHROID) 100 MCG tablet Take 1 tablet (100 mcg total) by mouth daily before breakfast. 90 tablet 3    LORazepam (ATIVAN) 1 MG tablet TAKE 1/2 TO 1 AND 1/2 TABLETS AT BEDTIME AND AN ADDITIONAL 1/2 TAB ONCE A DAY AS NEEDED FOR ANXIETY 60 tablet 3   LORazepam (ATIVAN) 2 MG tablet Take by mouth.     Magnesium 400 MG CAPS Take 1 capsule by mouth daily. 30 capsule 0   metoprolol succinate (TOPROL-XL) 50 MG 24 hr tablet Take 2 tablets (100 mg total) by mouth daily. Take with or immediately following a meal 60 tablet 0   moxifloxacin (VIGAMOX) 0.5 % ophthalmic solution Place one drop into the left eye 4 (four) times daily.     OXYGEN Inhale 3-4 L/min into the lungs continuous.      potassium chloride SA (KLOR-CON M) 20 MEQ tablet Take 1 tablet (20 mEq total) by mouth daily. 90 tablet 0   prednisoLONE acetate (PRED FORTE) 1 % ophthalmic suspension Place one drop into the left eye 4 (four) times daily.     pregabalin (LYRICA) 50 MG capsule TAKE 1 CAPSULE BY MOUTH EVERY MORNING PLUS 1 CAP MIDDAY, PLUS 2 CAPS IN THE EVENING 120 capsule 5   QUEtiapine (SEROQUEL) 200 MG tablet Take 1 tablet (200 mg total) by mouth at bedtime. 90 tablet 3   rosuvastatin (CRESTOR) 10 MG tablet TAKE 1 TABLET BY MOUTH ONCE DAILY FOR HEART HEALTH  AND TO LOWER CHOLESTEROL 90 tablet 0   Suvorexant (BELSOMRA) 15 MG TABS Take 1 tablet (15 mg total) by mouth at bedtime. 30 tablet 1   torsemide (DEMADEX) 20 MG tablet Take 3 tablets (60 mg total) by mouth daily. 90 tablet 0   zolpidem (AMBIEN CR) 12.5 MG CR tablet TAKE 1 TABLET BY MOUTH AT BEDTIME AS NEEDED FOR SLEEP 30 tablet 3   zolpidem (AMBIEN) 10 MG tablet Take by mouth.     No facility-administered medications prior to visit.    Allergies  Allergen Reactions   Hydrocodone Other (See Comments)    Constipation and hallucinations   Norvasc [Amlodipine Besylate] Swelling and Other (See Comments)    Marked swelling of the limbs   Tizanidine Other (See Comments)    After the 3rd dose, the patient's mouth began to feel numb and she felt like she was a having a "hot flash"     Review of Systems  Constitutional:  Negative for fever and malaise/fatigue.  HENT:  Negative for congestion.        Pt has difficulty talking due to pain in mouth Pt believes she has thrush  Eyes:  Negative for blurred vision.  Respiratory:  Negative for cough and shortness of breath.   Cardiovascular:  Negative for chest pain, palpitations and leg swelling.  Gastrointestinal:  Negative for abdominal pain, blood in stool, nausea and vomiting.  Genitourinary:  Negative for dysuria and frequency.  Musculoskeletal:  Negative for back pain and falls.  Skin:  Negative for rash.  Neurological:  Negative for dizziness, loss of consciousness and headaches.  Endo/Heme/Allergies:  Negative for environmental allergies.  Psychiatric/Behavioral:  Negative for depression. The patient is not nervous/anxious.        Objective:    Physical Exam Vitals and nursing note reviewed.  Psychiatric:        Mood and Affect: Mood normal.     There were no vitals taken for this visit. Wt Readings from Last 3 Encounters:  11/07/23 268 lb 6.4 oz (121.7 kg)  08/21/23 268 lb (121.6 kg)  05/05/23 269 lb (122 kg)    Diabetic Foot Exam - Simple   No data filed    Lab Results  Component Value Date   WBC 6.5 11/07/2023   HGB 12.1 11/07/2023   HCT 37.1 11/07/2023   PLT 156.0 11/07/2023   GLUCOSE 230 (H) 11/07/2023   CHOL 86 11/07/2023   TRIG 88.0 11/07/2023   HDL 35.50 (L) 11/07/2023   LDLCALC 33 11/07/2023   ALT 12 11/07/2023   AST 19 11/07/2023   NA 139 11/07/2023   K 2.8 (LL) 11/07/2023   CL 92 (L) 11/07/2023   CREATININE 1.91 (H) 11/07/2023   BUN 22 11/07/2023   CO2 37 (H) 11/07/2023   TSH 3.26 11/07/2023   INR 1.2 09/01/2022   HGBA1C 10.3 (H) 11/07/2023   MICROALBUR <0.7 11/07/2023    Lab Results  Component Value Date   TSH 3.26 11/07/2023   Lab Results  Component Value Date   WBC 6.5 11/07/2023   HGB 12.1 11/07/2023   HCT 37.1 11/07/2023   MCV 95.8 11/07/2023   PLT  156.0 11/07/2023   Lab Results  Component Value Date   NA 139 11/07/2023   K 2.8 (LL) 11/07/2023   CO2 37 (H) 11/07/2023   GLUCOSE 230 (H) 11/07/2023   BUN 22 11/07/2023   CREATININE 1.91 (H) 11/07/2023   BILITOT 0.6 11/07/2023   ALKPHOS 97 11/07/2023   AST  19 11/07/2023   ALT 12 11/07/2023   PROT 8.8 (H) 11/07/2023   ALBUMIN 3.5 11/07/2023   CALCIUM 9.2 11/07/2023   ANIONGAP 8 12/23/2022   EGFR 36.0 05/31/2023   GFR 26.11 (L) 11/07/2023   Lab Results  Component Value Date   CHOL 86 11/07/2023   Lab Results  Component Value Date   HDL 35.50 (L) 11/07/2023   Lab Results  Component Value Date   LDLCALC 33 11/07/2023   Lab Results  Component Value Date   TRIG 88.0 11/07/2023   Lab Results  Component Value Date   CHOLHDL 2 11/07/2023   Lab Results  Component Value Date   HGBA1C 10.3 (H) 11/07/2023       Assessment & Plan:  Thrush -     magic mouthwash w/lidocaine; Take 5 mLs by mouth 4 (four) times daily as needed for mouth pain. Swish and Spit  Dispense: 240 mL; Refill: 0  If symptoms do not improve or worsen pt would need to come in  Assessment & Plan     I discussed the assessment and treatment plan with the patient. The patient was provided an opportunity to ask questions and all were answered. The patient agreed with the plan and demonstrated an understanding of the instructions.   The patient was advised to call back or seek an in-person evaluation if the symptoms worsen or if the condition fails to improve as anticipated.  I provided 25 minutes of non-face-to-face time during this encounter.   Estill Hemming, DO  [facility] [phone] (phone) [phone] (fax)  [facility]

## 2023-12-29 ENCOUNTER — Emergency Department (HOSPITAL_COMMUNITY)

## 2023-12-29 ENCOUNTER — Encounter (HOSPITAL_COMMUNITY): Payer: Self-pay

## 2023-12-29 ENCOUNTER — Emergency Department (HOSPITAL_COMMUNITY)
Admission: EM | Admit: 2023-12-29 | Discharge: 2023-12-29 | Disposition: A | Discharge status: Home / Self Care | Attending: Emergency Medicine | Admitting: Emergency Medicine

## 2023-12-29 ENCOUNTER — Other Ambulatory Visit: Payer: Self-pay

## 2023-12-29 DIAGNOSIS — J9611 Chronic respiratory failure with hypoxia: Secondary | ICD-10-CM | POA: Diagnosis present

## 2023-12-29 DIAGNOSIS — Z7984 Long term (current) use of oral hypoglycemic drugs: Secondary | ICD-10-CM | POA: Diagnosis not present

## 2023-12-29 DIAGNOSIS — I5032 Chronic diastolic (congestive) heart failure: Secondary | ICD-10-CM | POA: Diagnosis present

## 2023-12-29 DIAGNOSIS — W06XXXA Fall from bed, initial encounter: Secondary | ICD-10-CM | POA: Diagnosis present

## 2023-12-29 DIAGNOSIS — I130 Hypertensive heart and chronic kidney disease with heart failure and stage 1 through stage 4 chronic kidney disease, or unspecified chronic kidney disease: Secondary | ICD-10-CM | POA: Diagnosis present

## 2023-12-29 DIAGNOSIS — M25551 Pain in right hip: Secondary | ICD-10-CM | POA: Insufficient documentation

## 2023-12-29 DIAGNOSIS — W19XXXA Unspecified fall, initial encounter: Secondary | ICD-10-CM | POA: Diagnosis not present

## 2023-12-29 DIAGNOSIS — M7732 Calcaneal spur, left foot: Secondary | ICD-10-CM | POA: Diagnosis not present

## 2023-12-29 DIAGNOSIS — R918 Other nonspecific abnormal finding of lung field: Secondary | ICD-10-CM | POA: Diagnosis not present

## 2023-12-29 DIAGNOSIS — E872 Acidosis, unspecified: Secondary | ICD-10-CM | POA: Diagnosis present

## 2023-12-29 DIAGNOSIS — R739 Hyperglycemia, unspecified: Secondary | ICD-10-CM | POA: Diagnosis not present

## 2023-12-29 DIAGNOSIS — W050XXA Fall from non-moving wheelchair, initial encounter: Secondary | ICD-10-CM | POA: Insufficient documentation

## 2023-12-29 DIAGNOSIS — A419 Sepsis, unspecified organism: Secondary | ICD-10-CM | POA: Diagnosis not present

## 2023-12-29 DIAGNOSIS — Z79899 Other long term (current) drug therapy: Secondary | ICD-10-CM | POA: Insufficient documentation

## 2023-12-29 DIAGNOSIS — R7881 Bacteremia: Secondary | ICD-10-CM | POA: Diagnosis not present

## 2023-12-29 DIAGNOSIS — R7989 Other specified abnormal findings of blood chemistry: Secondary | ICD-10-CM | POA: Diagnosis not present

## 2023-12-29 DIAGNOSIS — J439 Emphysema, unspecified: Secondary | ICD-10-CM | POA: Diagnosis present

## 2023-12-29 DIAGNOSIS — E119 Type 2 diabetes mellitus without complications: Secondary | ICD-10-CM | POA: Insufficient documentation

## 2023-12-29 DIAGNOSIS — R000 Tachycardia, unspecified: Secondary | ICD-10-CM | POA: Diagnosis not present

## 2023-12-29 DIAGNOSIS — E1122 Type 2 diabetes mellitus with diabetic chronic kidney disease: Secondary | ICD-10-CM | POA: Diagnosis present

## 2023-12-29 DIAGNOSIS — S92342A Displaced fracture of fourth metatarsal bone, left foot, initial encounter for closed fracture: Secondary | ICD-10-CM | POA: Diagnosis not present

## 2023-12-29 DIAGNOSIS — Z043 Encounter for examination and observation following other accident: Secondary | ICD-10-CM | POA: Diagnosis not present

## 2023-12-29 DIAGNOSIS — Z6841 Body Mass Index (BMI) 40.0 and over, adult: Secondary | ICD-10-CM | POA: Diagnosis not present

## 2023-12-29 DIAGNOSIS — N179 Acute kidney failure, unspecified: Secondary | ICD-10-CM | POA: Diagnosis present

## 2023-12-29 DIAGNOSIS — M25572 Pain in left ankle and joints of left foot: Secondary | ICD-10-CM | POA: Diagnosis not present

## 2023-12-29 DIAGNOSIS — R9431 Abnormal electrocardiogram [ECG] [EKG]: Secondary | ICD-10-CM | POA: Diagnosis not present

## 2023-12-29 DIAGNOSIS — I214 Non-ST elevation (NSTEMI) myocardial infarction: Secondary | ICD-10-CM | POA: Diagnosis not present

## 2023-12-29 DIAGNOSIS — R509 Fever, unspecified: Secondary | ICD-10-CM | POA: Diagnosis not present

## 2023-12-29 DIAGNOSIS — M160 Bilateral primary osteoarthritis of hip: Secondary | ICD-10-CM | POA: Diagnosis not present

## 2023-12-29 DIAGNOSIS — E785 Hyperlipidemia, unspecified: Secondary | ICD-10-CM | POA: Diagnosis present

## 2023-12-29 DIAGNOSIS — E860 Dehydration: Secondary | ICD-10-CM | POA: Diagnosis present

## 2023-12-29 DIAGNOSIS — E039 Hypothyroidism, unspecified: Secondary | ICD-10-CM | POA: Diagnosis present

## 2023-12-29 DIAGNOSIS — Z794 Long term (current) use of insulin: Secondary | ICD-10-CM | POA: Insufficient documentation

## 2023-12-29 DIAGNOSIS — M2012 Hallux valgus (acquired), left foot: Secondary | ICD-10-CM | POA: Diagnosis not present

## 2023-12-29 DIAGNOSIS — I10 Essential (primary) hypertension: Secondary | ICD-10-CM | POA: Insufficient documentation

## 2023-12-29 DIAGNOSIS — N1832 Chronic kidney disease, stage 3b: Secondary | ICD-10-CM | POA: Diagnosis present

## 2023-12-29 DIAGNOSIS — E876 Hypokalemia: Secondary | ICD-10-CM | POA: Diagnosis present

## 2023-12-29 DIAGNOSIS — J9811 Atelectasis: Secondary | ICD-10-CM | POA: Diagnosis not present

## 2023-12-29 DIAGNOSIS — R0600 Dyspnea, unspecified: Secondary | ICD-10-CM | POA: Diagnosis not present

## 2023-12-29 DIAGNOSIS — K219 Gastro-esophageal reflux disease without esophagitis: Secondary | ICD-10-CM | POA: Diagnosis present

## 2023-12-29 DIAGNOSIS — Z79890 Hormone replacement therapy (postmenopausal): Secondary | ICD-10-CM | POA: Diagnosis not present

## 2023-12-29 DIAGNOSIS — R4182 Altered mental status, unspecified: Secondary | ICD-10-CM | POA: Diagnosis not present

## 2023-12-29 DIAGNOSIS — E1165 Type 2 diabetes mellitus with hyperglycemia: Secondary | ICD-10-CM | POA: Diagnosis present

## 2023-12-29 DIAGNOSIS — G9341 Metabolic encephalopathy: Secondary | ICD-10-CM | POA: Diagnosis present

## 2023-12-29 DIAGNOSIS — M6282 Rhabdomyolysis: Secondary | ICD-10-CM | POA: Diagnosis present

## 2023-12-29 DIAGNOSIS — E782 Mixed hyperlipidemia: Secondary | ICD-10-CM | POA: Diagnosis not present

## 2023-12-29 DIAGNOSIS — Y92003 Bedroom of unspecified non-institutional (private) residence as the place of occurrence of the external cause: Secondary | ICD-10-CM | POA: Diagnosis not present

## 2023-12-29 DIAGNOSIS — R002 Palpitations: Secondary | ICD-10-CM | POA: Diagnosis not present

## 2023-12-29 MED ORDER — LIDOCAINE 5 % EX PTCH
1.0000 | MEDICATED_PATCH | Freq: Once | CUTANEOUS | Status: DC
  Administered 2023-12-29: 1 via TRANSDERMAL
  Filled 2023-12-29: qty 1

## 2023-12-29 MED ORDER — OXYCODONE-ACETAMINOPHEN 5-325 MG PO TABS
1.0000 | ORAL_TABLET | Freq: Once | ORAL | Status: AC
  Administered 2023-12-29: 1 via ORAL
  Filled 2023-12-29: qty 1

## 2023-12-29 MED ORDER — OXYCODONE HCL 5 MG PO TABS: 5.0000 mg | ORAL_TABLET | Freq: Four times a day (QID) | ORAL | 0 refills | Status: DC | PRN

## 2023-12-29 MED ORDER — SENNOSIDES-DOCUSATE SODIUM 8.6-50 MG PO TABS: 1.0000 | ORAL_TABLET | Freq: Every day | ORAL | 0 refills | Status: DC

## 2023-12-29 MED ORDER — LIDOCAINE 5 % EX PTCH: 1.0000 | MEDICATED_PATCH | CUTANEOUS | 0 refills | Status: DC

## 2023-12-29 NOTE — Discharge Instructions (Addendum)
 You were seen in the emergency department after your fall.  Your x-ray showed no broken bones and you likely bruised your hip.  You can ice your hip as well as use lidocaine  patches.  You can take Tylenol  and Motrin  every 6 hours as needed for pain and I have given you oxycodone  for breakthrough pain.  This can make you drowsy so do not take it while driving, working or operating heavy machinery.  Can also make you constipated so I have given you a stool softener to take while you are on this.  You can follow-up with your primary doctor in the next few days to have your symptoms rechecked.  You should return to the emergency department for significantly worsening pain and you are unable to walk, you have recurrent falls or injuries or any other new or concerning symptoms.

## 2023-12-29 NOTE — ED Triage Notes (Signed)
 Patient BIB [facility] from home. Tripped and fell onto her right hip. No LOC. No blood thinners. Patient feels like she broke her hip.

## 2023-12-29 NOTE — ED Provider Notes (Signed)
 [facility] AT [facility] Provider Note   CSN: [id number] Arrival date & time: 12/29/23  1848     History  Chief Complaint  Patient presents with   Sue [name]    Sue [name] is a 73 y.o. female.  Patient is a 73 year old female with a past medical history of emphysema on 4 L home O2, hypertension, diabetes presenting to the emergency department after a fall.  Patient states that she does not sleep well at night and was up last night getting something from the kitchen.  She states when she tried to stand up to her walker from the couch she slipped and fell onto her right side.  She states that she was able to get herself up after the fall but has been having a difficult time walking with her walker since then due to pain in her right hip.  She denies hitting her head or losing consciousness.  She states that her right leg feels weak from the pain.  She denies any other pain or injuries from the fall.  She denies any blood thinner use.  The history is provided by the patient and a relative.  Fall       Home Medications Prior to Admission medications   Medication Sig Start Date End Date Taking? Authorizing Provider  lidocaine  (LIDODERM ) 5 % Place 1 patch onto the skin daily. Remove & Discard patch within 12 hours or as directed by MD 12/29/23  Yes Nora Beal, Emmylou Bieker K, DO  oxyCODONE  (ROXICODONE ) 5 MG immediate release tablet Take 1 tablet (5 mg total) by mouth every 6 (six) hours as needed. 12/29/23  Yes Nora Beal, Emunah Texidor K, DO  senna-docusate (SENOKOT-S) 8.6-50 MG tablet Take 1 tablet by mouth daily. 12/29/23  Yes Nora Beal, Janayla Marik K, DO  Accu-Chek Softclix Lancets lancets USE 1  TO CHECK GLUCOSE 4 TIMES DAILY 01/16/23   Crecencio Dodge, Candida Chalk, DO  albuterol  (VENTOLIN  HFA) 108 (90 Base) MCG/ACT inhaler Inhale 1-2 puffs into the lungs every 6 (six) hours as needed for wheezing or shortness of breath. 12/23/22   Teddi Favors, DO  allopurinol  (ZYLOPRIM ) 100 MG  tablet Take 100 mg by mouth daily as needed (for gout flareups). 06/10/21   [provider]  amLODipine  (NORVASC ) 5 MG tablet Take by mouth. 11/14/17   [provider]  aspirin  EC 81 MG tablet Take by mouth. 05/03/17   [provider]  Blood Glucose Monitoring Suppl (ACCU-CHEK GUIDE) w/Device KIT USE   TO CHECK GLUCOSE UP TO 4 TIMES DAILY AS DIRECTED 11/11/22   Crecencio Dodge, Candida Chalk, DO  busPIRone  (BUSPAR ) 15 MG tablet Take 1 tablet (15 mg total) by mouth 2 (two) times daily. 04/24/23   Lincoln Renshaw, NP  diclofenac  Sodium (VOLTAREN  ARTHRITIS PAIN) 1 % GEL Apply 4 g topically 4 (four) times daily. 03/03/23   Roel Clarity R, DO  Dulaglutide  (TRULICITY ) 3 MG/0.5ML SOAJ Inject 3 mg into the skin once a week. 08/22/23   Shamleffer, Ibtehal Jaralla, MD  empagliflozin  (JARDIANCE ) 10 MG TABS tablet Take 1 tablet (10 mg total) by mouth daily before breakfast. 08/21/23   Shamleffer, Ibtehal Jaralla, MD  famotidine  (PEPCID ) 20 MG tablet Take 20 mg by mouth daily as needed for heartburn or indigestion.    [provider]  fluticasone  (FLONASE ) 50 MCG/ACT nasal spray 2 sprays. 05/02/17   [provider]  gabapentin  (NEURONTIN ) 100 MG capsule Take by mouth.    [provider]  glucose  blood (ACCU-CHEK GUIDE) test strip USE 1 STRIP TO CHECK GLUCOSE 4 TIMES DAILY 11/04/22   Crecencio Dodge, Candida Chalk, DO  ibuprofen  (ADVIL ) 800 MG tablet Take by mouth. 07/28/22   [provider]  insulin  aspart (NOVOLOG  FLEXPEN) 100 UNIT/ML FlexPen Novolog  10 units with each meal PLUS the scale if needed -Novolog  correctional insulin : ADD extra units on insulin  to your meal-time Novolog  dose if your blood sugars are higher than 160. Use the scale below to help guide you:  Blood sugar before meal Number of units to inject Less than 160 0 unit 161 -  190 1 units 191 -  220 2 units 221 -  250 3 units 251 -  280 4 units 281 -  310 5 units 311 -  340 6 units 341 -  370 7 units 371 -   400 8 units Max daily 45 units 08/22/23   Shamleffer, Ibtehal Jaralla, MD  insulin  glargine, 1 Unit Dial, (TOUJEO  SOLOSTAR) 300 UNIT/ML Solostar Pen INJECT 64 UNITS SUBCUTANEOUSLY ONCE DAILY IN THE AFTERNOON.**APPOINTMENT NEEDED FOR FURTHER REFILLS** 09/18/23   Shamleffer, Ibtehal Jaralla, MD  Insulin  Pen Needle (BD PEN NEEDLE NANO U/F) 32G X 4 MM MISC Use to inject insulin  08/22/23   Shamleffer, Ibtehal Jaralla, MD  ketorolac (ACULAR) 0.5 % ophthalmic solution Place one drop into the left eye 4 (four) times daily. 11/25/22   [provider]  levothyroxine  (SYNTHROID ) 100 MCG tablet Take 1 tablet (100 mcg total) by mouth daily before breakfast. 08/22/23   Shamleffer, Ibtehal Jaralla, MD  LORazepam  (ATIVAN ) 1 MG tablet TAKE 1/2 TO 1 AND 1/2 TABLETS AT BEDTIME AND AN ADDITIONAL 1/2 TAB ONCE A DAY AS NEEDED FOR ANXIETY 11/13/23   Mozingo, Regina Nattalie, NP  LORazepam  (ATIVAN ) 2 MG tablet Take by mouth. 09/24/10   [provider]  magic mouthwash w/lidocaine  SOLN Take 5 mLs by mouth 4 (four) times daily as needed for mouth pain. Swish and Spit 12/28/23   Roel Clarity R, DO  Magnesium  400 MG CAPS Take 1 capsule by mouth daily. 01/14/20   Neda Balk, MD  metoprolol  succinate (TOPROL -XL) 50 MG 24 hr tablet Take 2 tablets (100 mg total) by mouth daily. Take with or immediately following a meal 10/17/23   Crecencio Dodge, Yvonne R, DO  moxifloxacin (VIGAMOX) 0.5 % ophthalmic solution Place one drop into the left eye 4 (four) times daily. 11/25/22   [provider]  OXYGEN  Inhale 3-4 L/min into the lungs continuous.     [provider]  potassium chloride  SA (KLOR-CON  M) 20 MEQ tablet Take 1 tablet (20 mEq total) by mouth daily. 10/31/23   Lowne Chase, Yvonne R, DO  prednisoLONE acetate (PRED FORTE) 1 % ophthalmic suspension Place one drop into the left eye 4 (four) times daily. 11/25/22   [provider]  pregabalin  (LYRICA ) 50 MG capsule TAKE 1 CAPSULE BY MOUTH EVERY  MORNING PLUS 1 CAP MIDDAY, PLUS 2 CAPS IN THE EVENING 11/13/23   Ellene Gustin, MD  QUEtiapine  (SEROQUEL ) 200 MG tablet Take 1 tablet (200 mg total) by mouth at bedtime. 04/24/23   Lincoln Renshaw, NP  rosuvastatin  (CRESTOR ) 10 MG tablet TAKE 1 TABLET BY MOUTH ONCE DAILY FOR HEART HEALTH AND TO LOWER CHOLESTEROL 09/18/23   Crecencio Dodge, Candida Chalk, DO  Suvorexant  (BELSOMRA ) 15 MG TABS Take 1 tablet (15 mg total) by mouth at bedtime. 02/13/23   Lincoln Renshaw, NP  torsemide  (DEMADEX ) 20 MG tablet Take 3  tablets (60 mg total) by mouth daily. 07/28/23   Roel Clarity R, DO  zolpidem  (AMBIEN  CR) 12.5 MG CR tablet TAKE 1 TABLET BY MOUTH AT BEDTIME AS NEEDED FOR SLEEP 11/13/23   Mozingo, Regina Nattalie, NP  zolpidem  (AMBIEN ) 10 MG tablet Take by mouth. 09/24/10   [provider]      Allergies    Hydrocodone , Norvasc  [amlodipine  besylate], and Tizanidine     Review of Systems   Review of Systems  Physical Exam Updated Vital Signs BP (!) 115/59 (BP Location: Left Arm)   Pulse 72   Temp 98.5 F (36.9 C) (Oral)   Resp 18   Ht 5\' 3"  (1.6 m)   Wt 122.5 kg   SpO2 96%   BMI 47.83 kg/m  Physical Exam Vitals and nursing note reviewed.  Constitutional:      General: She is not in acute distress.    Appearance: Normal appearance. She is obese.  HENT:     Head: Normocephalic and atraumatic.     Nose: Nose normal.     Mouth/Throat:     Mouth: Mucous membranes are moist.     Pharynx: Oropharynx is clear.  Eyes:     Extraocular Movements: Extraocular movements intact.     Conjunctiva/sclera: Conjunctivae normal.  Cardiovascular:     Rate and Rhythm: Normal rate and regular rhythm.     Pulses: Normal pulses.     Heart sounds: Normal heart sounds.  Pulmonary:     Effort: Pulmonary effort is normal.     Breath sounds: Normal breath sounds.  Abdominal:     General: Abdomen is flat.     Palpations: Abdomen is soft.     Tenderness: There is no abdominal tenderness.  Musculoskeletal:         General: Normal range of motion.     Comments: No midline neck or back tenderness Tenderness to palpation of R hip and iliac crest, mild pain with internal/external rotation, tenderness to palpation of R knee No tenderness in bilateral UE or LLE  Skin:    General: Skin is warm and dry.  Neurological:     General: No focal deficit present.     Mental Status: She is alert and oriented to person, place, and time.  Psychiatric:        Mood and Affect: Mood normal.        Behavior: Behavior normal.     ED Results / Procedures / Treatments   Labs (all labs ordered are listed, but only abnormal results are displayed) Labs Reviewed - No data to display  EKG None  Radiology DG Hip Unilat With Pelvis 2-3 Views Right Result Date: 12/29/2023 CLINICAL DATA:  Status post fall. EXAM: DG HIP (WITH OR WITHOUT PELVIS) 2-3V RIGHT COMPARISON:  None Available. FINDINGS: There is no evidence of hip fracture or dislocation. There is no evidence of arthropathy or other focal bone abnormality. IMPRESSION: Negative. Electronically Signed   By: Virgle Grime M.D.   On: 12/29/2023 22:25   DG Knee Complete 4 Views Right Result Date: 12/29/2023 CLINICAL DATA:  Status post fall. EXAM: RIGHT KNEE - COMPLETE 4+ VIEW COMPARISON:  None Available. FINDINGS: No evidence of fracture, dislocation, or joint effusion. No evidence of arthropathy or other focal bone abnormality. Soft tissues are unremarkable. IMPRESSION: Negative. Electronically Signed   By: Virgle Grime M.D.   On: 12/29/2023 22:23    Procedures Procedures    Medications Ordered in ED Medications  lidocaine  (LIDODERM ) 5 %  1-3 patch (1 patch Transdermal Patch Applied 12/29/23 1949)  oxyCODONE -acetaminophen  (PERCOCET/ROXICET) 5-325 MG per tablet 1 tablet (1 tablet Oral Given 12/29/23 1949)    ED Course/ Medical Decision Making/ A&P Clinical Course as of 12/29/23 2239  Fri Dec 29, 2023  2232 No acute traumatic injury on XR imaging. [VK]     Clinical Course User Index [VK] Kingsley, Karnell Vanderloop K, DO                                 Medical Decision Making This patient presents to the ED with chief complaint(s) of fall, R hip pain with pertinent past medical history of emphysema on 4 L home O2, hypertension, diabetes which further complicates the presenting complaint. The complaint involves an extensive differential diagnosis and also carries with it a high risk of complications and morbidity.    The differential diagnosis includes hip fracture, pelvis fracture, sprain, knee fracture, elbow contusion, no other traumatic injury  Additional history obtained: Additional history obtained from family Records reviewed Primary Care Documents  ED Course and Reassessment: On patient's arrival she is hemodynamically stable in no acute distress.  Will have x-rays of her right hip and right knee, no other traumatic injury seen on exam.  Will be given pain control and will be closely reassessed.  Independent labs interpretation:  N/A  Independent visualization of imaging: - I independently visualized the following imaging with scope of interpretation limited to determining acute life threatening conditions related to emergency care: R hip/knee XR, which revealed no acute traumatic injury  Consultation: - Consulted or discussed management/test interpretation w/ external professional: N/A  Consideration for admission or further workup: Patient has no emergent conditions requiring admission or further work-up at this time and is stable for discharge home with primary care follow-up  Social Determinants of health: N/A    Amount and/or Complexity of Data Reviewed Radiology: ordered.  Risk OTC drugs. Prescription drug management.          Final Clinical Impression(s) / ED Diagnoses Final diagnoses:  Fall, initial encounter  Right hip pain    Rx / DC Orders ED Discharge Orders          Ordered    oxyCODONE  (ROXICODONE )  5 MG immediate release tablet  Every 6 hours PRN        12/29/23 2237    lidocaine  (LIDODERM ) 5 %  Every 24 hours        12/29/23 2238    senna-docusate (SENOKOT-S) 8.6-50 MG tablet  Daily        12/29/23 2238              Kingsley, Jaeden Messer K, DO 12/29/23 2239

## 2023-12-31 ENCOUNTER — Encounter (HOSPITAL_COMMUNITY): Payer: Self-pay

## 2023-12-31 ENCOUNTER — Inpatient Hospital Stay (HOSPITAL_COMMUNITY)
Admission: EM | Admit: 2023-12-31 | Discharge: 2024-01-08 | DRG: 682 | Disposition: A | Discharge status: Home Health | Attending: Internal Medicine | Admitting: Internal Medicine

## 2023-12-31 ENCOUNTER — Other Ambulatory Visit: Payer: Self-pay

## 2023-12-31 ENCOUNTER — Emergency Department (HOSPITAL_COMMUNITY)

## 2023-12-31 DIAGNOSIS — R7989 Other specified abnormal findings of blood chemistry: Secondary | ICD-10-CM | POA: Diagnosis not present

## 2023-12-31 DIAGNOSIS — E1122 Type 2 diabetes mellitus with diabetic chronic kidney disease: Secondary | ICD-10-CM | POA: Diagnosis present

## 2023-12-31 DIAGNOSIS — J439 Emphysema, unspecified: Secondary | ICD-10-CM | POA: Diagnosis present

## 2023-12-31 DIAGNOSIS — Z6841 Body Mass Index (BMI) 40.0 and over, adult: Secondary | ICD-10-CM | POA: Diagnosis not present

## 2023-12-31 DIAGNOSIS — E1165 Type 2 diabetes mellitus with hyperglycemia: Secondary | ICD-10-CM | POA: Diagnosis present

## 2023-12-31 DIAGNOSIS — J9611 Chronic respiratory failure with hypoxia: Secondary | ICD-10-CM | POA: Diagnosis present

## 2023-12-31 DIAGNOSIS — Z818 Family history of other mental and behavioral disorders: Secondary | ICD-10-CM

## 2023-12-31 DIAGNOSIS — Y92003 Bedroom of unspecified non-institutional (private) residence as the place of occurrence of the external cause: Secondary | ICD-10-CM | POA: Diagnosis not present

## 2023-12-31 DIAGNOSIS — Z87891 Personal history of nicotine dependence: Secondary | ICD-10-CM

## 2023-12-31 DIAGNOSIS — Z79890 Hormone replacement therapy (postmenopausal): Secondary | ICD-10-CM | POA: Diagnosis not present

## 2023-12-31 DIAGNOSIS — Z7984 Long term (current) use of oral hypoglycemic drugs: Secondary | ICD-10-CM | POA: Diagnosis not present

## 2023-12-31 DIAGNOSIS — G8929 Other chronic pain: Secondary | ICD-10-CM | POA: Diagnosis present

## 2023-12-31 DIAGNOSIS — Z79899 Other long term (current) drug therapy: Secondary | ICD-10-CM | POA: Diagnosis not present

## 2023-12-31 DIAGNOSIS — M6282 Rhabdomyolysis: Principal | ICD-10-CM | POA: Diagnosis present

## 2023-12-31 DIAGNOSIS — Z794 Long term (current) use of insulin: Secondary | ICD-10-CM | POA: Diagnosis not present

## 2023-12-31 DIAGNOSIS — I10 Essential (primary) hypertension: Secondary | ICD-10-CM

## 2023-12-31 DIAGNOSIS — Z83438 Family history of other disorder of lipoprotein metabolism and other lipidemia: Secondary | ICD-10-CM

## 2023-12-31 DIAGNOSIS — N1832 Chronic kidney disease, stage 3b: Secondary | ICD-10-CM | POA: Diagnosis present

## 2023-12-31 DIAGNOSIS — M160 Bilateral primary osteoarthritis of hip: Secondary | ICD-10-CM | POA: Diagnosis not present

## 2023-12-31 DIAGNOSIS — Z7982 Long term (current) use of aspirin: Secondary | ICD-10-CM

## 2023-12-31 DIAGNOSIS — Z8249 Family history of ischemic heart disease and other diseases of the circulatory system: Secondary | ICD-10-CM

## 2023-12-31 DIAGNOSIS — I5032 Chronic diastolic (congestive) heart failure: Secondary | ICD-10-CM | POA: Diagnosis present

## 2023-12-31 DIAGNOSIS — I130 Hypertensive heart and chronic kidney disease with heart failure and stage 1 through stage 4 chronic kidney disease, or unspecified chronic kidney disease: Secondary | ICD-10-CM | POA: Diagnosis present

## 2023-12-31 DIAGNOSIS — J9811 Atelectasis: Secondary | ICD-10-CM | POA: Diagnosis not present

## 2023-12-31 DIAGNOSIS — R0600 Dyspnea, unspecified: Secondary | ICD-10-CM | POA: Diagnosis not present

## 2023-12-31 DIAGNOSIS — R739 Hyperglycemia, unspecified: Secondary | ICD-10-CM | POA: Diagnosis not present

## 2023-12-31 DIAGNOSIS — W06XXXA Fall from bed, initial encounter: Secondary | ICD-10-CM | POA: Diagnosis present

## 2023-12-31 DIAGNOSIS — E876 Hypokalemia: Secondary | ICD-10-CM | POA: Diagnosis present

## 2023-12-31 DIAGNOSIS — E872 Acidosis, unspecified: Secondary | ICD-10-CM | POA: Diagnosis present

## 2023-12-31 DIAGNOSIS — E039 Hypothyroidism, unspecified: Secondary | ICD-10-CM | POA: Diagnosis present

## 2023-12-31 DIAGNOSIS — N179 Acute kidney failure, unspecified: Secondary | ICD-10-CM | POA: Diagnosis present

## 2023-12-31 DIAGNOSIS — A419 Sepsis, unspecified organism: Secondary | ICD-10-CM | POA: Diagnosis not present

## 2023-12-31 DIAGNOSIS — Z043 Encounter for examination and observation following other accident: Secondary | ICD-10-CM | POA: Diagnosis not present

## 2023-12-31 DIAGNOSIS — R9431 Abnormal electrocardiogram [ECG] [EKG]: Secondary | ICD-10-CM | POA: Diagnosis not present

## 2023-12-31 DIAGNOSIS — G4700 Insomnia, unspecified: Secondary | ICD-10-CM | POA: Diagnosis present

## 2023-12-31 DIAGNOSIS — G9341 Metabolic encephalopathy: Secondary | ICD-10-CM | POA: Diagnosis present

## 2023-12-31 DIAGNOSIS — R7881 Bacteremia: Secondary | ICD-10-CM | POA: Diagnosis not present

## 2023-12-31 DIAGNOSIS — R918 Other nonspecific abnormal finding of lung field: Secondary | ICD-10-CM | POA: Diagnosis not present

## 2023-12-31 DIAGNOSIS — Z8673 Personal history of transient ischemic attack (TIA), and cerebral infarction without residual deficits: Secondary | ICD-10-CM

## 2023-12-31 DIAGNOSIS — R000 Tachycardia, unspecified: Secondary | ICD-10-CM | POA: Diagnosis not present

## 2023-12-31 DIAGNOSIS — E860 Dehydration: Secondary | ICD-10-CM | POA: Diagnosis present

## 2023-12-31 DIAGNOSIS — E782 Mixed hyperlipidemia: Secondary | ICD-10-CM | POA: Diagnosis not present

## 2023-12-31 DIAGNOSIS — K219 Gastro-esophageal reflux disease without esophagitis: Secondary | ICD-10-CM | POA: Diagnosis present

## 2023-12-31 DIAGNOSIS — M25551 Pain in right hip: Secondary | ICD-10-CM | POA: Diagnosis not present

## 2023-12-31 DIAGNOSIS — Z90710 Acquired absence of both cervix and uterus: Secondary | ICD-10-CM

## 2023-12-31 DIAGNOSIS — Z888 Allergy status to other drugs, medicaments and biological substances status: Secondary | ICD-10-CM

## 2023-12-31 DIAGNOSIS — I214 Non-ST elevation (NSTEMI) myocardial infarction: Secondary | ICD-10-CM | POA: Diagnosis not present

## 2023-12-31 DIAGNOSIS — R509 Fever, unspecified: Secondary | ICD-10-CM | POA: Diagnosis not present

## 2023-12-31 DIAGNOSIS — E785 Hyperlipidemia, unspecified: Secondary | ICD-10-CM | POA: Diagnosis present

## 2023-12-31 DIAGNOSIS — Z885 Allergy status to narcotic agent status: Secondary | ICD-10-CM

## 2023-12-31 DIAGNOSIS — S92342A Displaced fracture of fourth metatarsal bone, left foot, initial encounter for closed fracture: Secondary | ICD-10-CM | POA: Diagnosis not present

## 2023-12-31 DIAGNOSIS — M2012 Hallux valgus (acquired), left foot: Secondary | ICD-10-CM | POA: Diagnosis not present

## 2023-12-31 DIAGNOSIS — M7732 Calcaneal spur, left foot: Secondary | ICD-10-CM | POA: Diagnosis not present

## 2023-12-31 DIAGNOSIS — R4182 Altered mental status, unspecified: Secondary | ICD-10-CM | POA: Diagnosis not present

## 2023-12-31 DIAGNOSIS — Z9981 Dependence on supplemental oxygen: Secondary | ICD-10-CM

## 2023-12-31 DIAGNOSIS — Z833 Family history of diabetes mellitus: Secondary | ICD-10-CM

## 2023-12-31 DIAGNOSIS — Z825 Family history of asthma and other chronic lower respiratory diseases: Secondary | ICD-10-CM

## 2023-12-31 DIAGNOSIS — Z7985 Long-term (current) use of injectable non-insulin antidiabetic drugs: Secondary | ICD-10-CM

## 2023-12-31 DIAGNOSIS — R002 Palpitations: Secondary | ICD-10-CM | POA: Diagnosis not present

## 2023-12-31 DIAGNOSIS — M25572 Pain in left ankle and joints of left foot: Secondary | ICD-10-CM | POA: Diagnosis not present

## 2023-12-31 LAB — URINALYSIS, ROUTINE W REFLEX MICROSCOPIC
Bacteria, UA: NONE SEEN
Bilirubin Urine: NEGATIVE
Glucose, UA: >=500 mg/dL — AB
Ketones, ur: NEGATIVE mg/dL
Leukocytes,Ua: NEGATIVE
Nitrite: NEGATIVE
Protein, ur: NEGATIVE mg/dL
Specific Gravity, Urine: 1.022 (ref 1.005–1.030)
pH: 5 (ref 5.0–8.0)

## 2023-12-31 LAB — CBC WITH DIFFERENTIAL/PLATELET
Abs Immature Granulocytes: 0.1 10*3/uL — ABNORMAL HIGH (ref 0.00–0.07)
Basophils Absolute: 0 10*3/uL (ref 0.0–0.1)
Basophils Relative: 0 %
Eosinophils Absolute: 0.1 10*3/uL (ref 0.0–0.5)
Eosinophils Relative: 1 %
HCT: 41.2 % (ref 36.0–46.0)
Hemoglobin: 13 g/dL (ref 12.0–15.0)
Immature Granulocytes: 1 %
Lymphocytes Relative: 16 %
Lymphs Abs: 1.7 10*3/uL (ref 0.7–4.0)
MCH: 30.4 pg (ref 26.0–34.0)
MCHC: 31.6 g/dL (ref 30.0–36.0)
MCV: 96.3 fL (ref 80.0–100.0)
Monocytes Absolute: 0.9 10*3/uL (ref 0.1–1.0)
Monocytes Relative: 8 %
Neutro Abs: 8.1 10*3/uL — ABNORMAL HIGH (ref 1.7–7.7)
Neutrophils Relative %: 74 %
Platelets: 184 10*3/uL (ref 150–400)
RBC: 4.28 MIL/uL (ref 3.87–5.11)
RDW: 14.5 % (ref 11.5–15.5)
WBC: 10.8 10*3/uL — ABNORMAL HIGH (ref 4.0–10.5)
nRBC: 0 % (ref 0.0–0.2)

## 2023-12-31 LAB — COMPREHENSIVE METABOLIC PANEL WITH GFR
ALT: 18 U/L (ref 0–44)
AST: 40 U/L (ref 15–41)
Albumin: 2.8 g/dL — ABNORMAL LOW (ref 3.5–5.0)
Alkaline Phosphatase: 107 U/L (ref 38–126)
Anion gap: 12 (ref 5–15)
BUN: 17 mg/dL (ref 8–23)
CO2: 29 mmol/L (ref 22–32)
Calcium: 9.3 mg/dL (ref 8.9–10.3)
Chloride: 98 mmol/L (ref 98–111)
Creatinine, Ser: 2.06 mg/dL — ABNORMAL HIGH (ref 0.44–1.00)
GFR, Estimated: 25 mL/min — ABNORMAL LOW
Glucose, Bld: 359 mg/dL — ABNORMAL HIGH (ref 70–99)
Potassium: 2.9 mmol/L — ABNORMAL LOW (ref 3.5–5.1)
Sodium: 139 mmol/L (ref 135–145)
Total Bilirubin: 1 mg/dL (ref 0.0–1.2)
Total Protein: 8.6 g/dL — ABNORMAL HIGH (ref 6.5–8.1)

## 2023-12-31 LAB — RAPID URINE DRUG SCREEN, HOSP PERFORMED
Amphetamines: NOT DETECTED
Barbiturates: NOT DETECTED
Benzodiazepines: POSITIVE — AB
Cocaine: NOT DETECTED
Opiates: NOT DETECTED
Tetrahydrocannabinol: NOT DETECTED

## 2023-12-31 LAB — I-STAT CG4 LACTIC ACID, ED
Lactic Acid, Venous: 4 mmol/L (ref 0.5–1.9)
Lactic Acid, Venous: 3.1 mmol/L (ref 0.5–1.9)

## 2023-12-31 LAB — GLUCOSE, CAPILLARY: Glucose-Capillary: 355 mg/dL — ABNORMAL HIGH (ref 70–99)

## 2023-12-31 LAB — TROPONIN I (HIGH SENSITIVITY)
Troponin I (High Sensitivity): 17 ng/L (ref <18)
Troponin I (High Sensitivity): 15 ng/L (ref <18)

## 2023-12-31 LAB — ETHANOL: Alcohol, Ethyl (B): <10 mg/dL (ref <10)

## 2023-12-31 LAB — CBG MONITORING, ED: Glucose-Capillary: 323 mg/dL — ABNORMAL HIGH (ref 70–99)

## 2023-12-31 LAB — MAGNESIUM: Magnesium: 1.9 mg/dL (ref 1.7–2.4)

## 2023-12-31 LAB — CK: Total CK: 1427 U/L — ABNORMAL HIGH (ref 38–234)

## 2023-12-31 MED ORDER — ACETAMINOPHEN 500 MG PO TABS
1000.0000 mg | ORAL_TABLET | Freq: Once | ORAL | Status: DC
  Filled 2023-12-31: qty 2

## 2023-12-31 MED ORDER — ACETAMINOPHEN 325 MG PO TABS
650.0000 mg | ORAL_TABLET | Freq: Four times a day (QID) | ORAL | Status: DC | PRN
  Administered 2024-01-01 – 2024-01-05 (×2): 650 mg via ORAL
  Filled 2023-12-31 (×3): qty 2

## 2023-12-31 MED ORDER — LEVOTHYROXINE SODIUM 100 MCG PO TABS
100.0000 ug | ORAL_TABLET | Freq: Every day | ORAL | Status: DC
  Administered 2024-01-01 – 2024-01-08 (×8): 100 ug via ORAL
  Filled 2023-12-31 (×8): qty 1

## 2023-12-31 MED ORDER — METOPROLOL SUCCINATE ER 100 MG PO TB24
100.0000 mg | ORAL_TABLET | Freq: Every day | ORAL | Status: DC
  Administered 2023-12-31 – 2024-01-08 (×8): 100 mg via ORAL
  Filled 2023-12-31: qty 2
  Filled 2023-12-31: qty 1
  Filled 2023-12-31: qty 2
  Filled 2023-12-31: qty 1
  Filled 2023-12-31 (×2): qty 2
  Filled 2023-12-31: qty 1
  Filled 2023-12-31: qty 2
  Filled 2023-12-31: qty 1

## 2023-12-31 MED ORDER — SUVOREXANT 15 MG PO TABS: 15.0000 mg | ORAL_TABLET | Freq: Every day | ORAL | Status: DC

## 2023-12-31 MED ORDER — ACETAMINOPHEN 650 MG RE SUPP: 650.0000 mg | Freq: Four times a day (QID) | RECTAL | Status: DC | PRN

## 2023-12-31 MED ORDER — AMLODIPINE BESYLATE 5 MG PO TABS: 5.0000 mg | ORAL_TABLET | Freq: Every day | ORAL | Status: DC

## 2023-12-31 MED ORDER — ALLOPURINOL 100 MG PO TABS: 100.0000 mg | ORAL_TABLET | Freq: Every day | ORAL | Status: DC | PRN

## 2023-12-31 MED ORDER — PREGABALIN 50 MG PO CAPS
100.0000 mg | ORAL_CAPSULE | Freq: Every day | ORAL | Status: DC
  Administered 2023-12-31 – 2024-01-07 (×8): 100 mg via ORAL
  Filled 2023-12-31 (×2): qty 2
  Filled 2023-12-31 (×2): qty 1
  Filled 2023-12-31: qty 2
  Filled 2023-12-31 (×2): qty 1
  Filled 2023-12-31: qty 2

## 2023-12-31 MED ORDER — SODIUM CHLORIDE 0.9 % IV SOLN: Freq: Once | INTRAVENOUS | Status: AC

## 2023-12-31 MED ORDER — PREGABALIN 50 MG PO CAPS
50.0000 mg | ORAL_CAPSULE | Freq: Two times a day (BID) | ORAL | Status: DC
  Administered 2023-12-31 – 2024-01-04 (×9): 50 mg via ORAL
  Filled 2023-12-31 (×9): qty 1

## 2023-12-31 MED ORDER — INSULIN ASPART 100 UNIT/ML IJ SOLN
0.0000 [IU] | Freq: Three times a day (TID) | INTRAMUSCULAR | Status: DC
  Filled 2023-12-31: qty 0.24

## 2023-12-31 MED ORDER — ASPIRIN 81 MG PO TBEC: 81.0000 mg | DELAYED_RELEASE_TABLET | Freq: Every day | ORAL | Status: DC

## 2023-12-31 MED ORDER — POTASSIUM CHLORIDE 20 MEQ PO PACK
40.0000 meq | PACK | Freq: Once | ORAL | Status: AC
  Administered 2023-12-31: 40 meq via ORAL
  Filled 2023-12-31: qty 2

## 2023-12-31 MED ORDER — QUETIAPINE FUMARATE 50 MG PO TABS
200.0000 mg | ORAL_TABLET | Freq: Every day | ORAL | Status: DC
  Administered 2023-12-31: 200 mg via ORAL
  Filled 2023-12-31: qty 4

## 2023-12-31 MED ORDER — IBUPROFEN 200 MG PO TABS
400.0000 mg | ORAL_TABLET | Freq: Four times a day (QID) | ORAL | Status: DC | PRN
  Administered 2024-01-01 – 2024-01-02 (×2): 400 mg via ORAL
  Filled 2023-12-31 (×2): qty 1

## 2023-12-31 MED ORDER — ONDANSETRON HCL 4 MG PO TABS: 4.0000 mg | ORAL_TABLET | Freq: Four times a day (QID) | ORAL | Status: DC | PRN

## 2023-12-31 MED ORDER — ROSUVASTATIN CALCIUM 10 MG PO TABS
10.0000 mg | ORAL_TABLET | Freq: Every day | ORAL | Status: DC
  Administered 2024-01-01: 10 mg via ORAL
  Filled 2023-12-31: qty 1

## 2023-12-31 MED ORDER — ONDANSETRON HCL 4 MG/2ML IJ SOLN
4.0000 mg | Freq: Four times a day (QID) | INTRAMUSCULAR | Status: DC | PRN
  Administered 2024-01-03 – 2024-01-04 (×2): 4 mg via INTRAVENOUS
  Filled 2023-12-31 (×2): qty 2

## 2023-12-31 MED ORDER — ZOLPIDEM TARTRATE 5 MG PO TABS
5.0000 mg | ORAL_TABLET | Freq: Every day | ORAL | Status: DC
  Administered 2023-12-31 – 2024-01-07 (×8): 5 mg via ORAL
  Filled 2023-12-31 (×8): qty 1

## 2023-12-31 MED ORDER — ENOXAPARIN SODIUM 40 MG/0.4ML IJ SOSY
40.0000 mg | PREFILLED_SYRINGE | Freq: Every day | INTRAMUSCULAR | Status: DC
  Administered 2023-12-31 – 2024-01-03 (×4): 40 mg via SUBCUTANEOUS
  Filled 2023-12-31 (×4): qty 0.4

## 2023-12-31 MED ORDER — SODIUM CHLORIDE 0.9 % IV BOLUS
500.0000 mL | Freq: Once | INTRAVENOUS | Status: AC
  Administered 2023-12-31: 500 mL via INTRAVENOUS

## 2023-12-31 MED ORDER — POTASSIUM CHLORIDE 10 MEQ/100ML IV SOLN
10.0000 meq | INTRAVENOUS | Status: AC
  Administered 2023-12-31 (×3): 10 meq via INTRAVENOUS
  Filled 2023-12-31 (×3): qty 100

## 2023-12-31 MED ORDER — LACTATED RINGERS IV SOLN: INTRAVENOUS | Status: DC

## 2023-12-31 MED ORDER — BUSPIRONE HCL 5 MG PO TABS
15.0000 mg | ORAL_TABLET | Freq: Two times a day (BID) | ORAL | Status: DC
  Administered 2023-12-31 – 2024-01-01 (×2): 15 mg via ORAL
  Filled 2023-12-31 (×2): qty 1

## 2023-12-31 MED ORDER — INSULIN GLARGINE-YFGN 100 UNIT/ML ~~LOC~~ SOLN
45.0000 [IU] | Freq: Every day | SUBCUTANEOUS | Status: DC
  Administered 2024-01-01 – 2024-01-07 (×8): 45 [IU] via SUBCUTANEOUS
  Filled 2023-12-31 (×10): qty 0.45

## 2023-12-31 NOTE — ED Provider Notes (Signed)
 [place] EMERGENCY DEPARTMENT AT [facility] Provider Note   CSN: [id number] Arrival date & time: 12/31/23  1535     History  Chief Complaint  Patient presents with   Sue [name] Friday, stay on the floor   Hyperglycemia    Sue [name] is a 73 y.o. female.  HPI Patient reports she was trying to get a glass of water by her bed and ended up losing her balance and falling between the nightstand table in her bed.  She reports she got stuck and could not get up.  Her daughter estimates that she fell approximately 24 hours ago.  Patient denies that she has a headache or struck her head.  Patient actually been seen just earlier for a fall with pain in her right hip.  Patient reports that her right hip still hurts although does not seem to be from this most recent falling out of bed.  She also reports she has some additional pain in the left foot.  Patient's daughter ultimately called and came to check on her and found her.  Patient daughter reports that the patient has had a propensity to overuse pain medications and comes unsteady and groggy or slightly confused.  This has been a recurrent problem.  Patient has gotten very physically deconditioned in the meantime and in that setting, has had increased difficulty with falls and mobility.  Patient daughter reports that at baseline if the patient is well, she functions and lives independently.  Patient denies that she had any vomiting or diarrhea.  She denies that she has chest pain or shortness of breath.    Home Medications Prior to Admission medications   Medication Sig Start Date End Date Taking? Authorizing Provider  albuterol  (VENTOLIN  HFA) 108 (90 Base) MCG/ACT inhaler Inhale 1-2 puffs into the lungs every 6 (six) hours as needed for wheezing or shortness of breath. 12/23/22  Yes Russella Courts A, DO  allopurinol  (ZYLOPRIM ) 100 MG tablet Take 100 mg by mouth daily. 06/10/21  Yes [provider]  busPIRone  (BUSPAR ) 15  MG tablet Take 1 tablet (15 mg total) by mouth 2 (two) times daily. 04/24/23  Yes White, Polly Brink A, NP  Dulaglutide  (TRULICITY ) 3 MG/0.5ML SOAJ Inject 3 mg into the skin once a week. 08/22/23  Yes Shamleffer, Ibtehal Jaralla, MD  empagliflozin  (JARDIANCE ) 10 MG TABS tablet Take 1 tablet (10 mg total) by mouth daily before breakfast. 08/21/23  Yes Shamleffer, Ibtehal Jaralla, MD  famotidine  (PEPCID ) 20 MG tablet Take 20 mg by mouth daily as needed for heartburn or indigestion.   Yes [provider]  ibuprofen  (ADVIL ) 200 MG tablet Take 400 mg by mouth every 6 (six) hours as needed for moderate pain (pain score 4-6).   Yes [provider]  insulin  aspart (NOVOLOG  FLEXPEN) 100 UNIT/ML FlexPen Novolog  10 units with each meal PLUS the scale if needed -Novolog  correctional insulin : ADD extra units on insulin  to your meal-time Novolog  dose if your blood sugars are higher than 160. Use the scale below to help guide you:  Blood sugar before meal Number of units to inject Less than 160 0 unit 161 -  190 1 units 191 -  220 2 units 221 -  250 3 units 251 -  280 4 units 281 -  310 5 units 311 -  340 6 units 341 -  370 7 units 371 -  400 8 units Max daily 45 units 08/22/23  Yes Shamleffer, Ibtehal Jaralla,  MD  insulin  glargine, 1 Unit Dial, (TOUJEO  SOLOSTAR) 300 UNIT/ML Solostar Pen INJECT 64 UNITS SUBCUTANEOUSLY ONCE DAILY IN THE AFTERNOON.**APPOINTMENT NEEDED FOR FURTHER REFILLS** Patient taking differently: 74 Units daily. 09/18/23  Yes Shamleffer, Ibtehal Jaralla, MD  levothyroxine  (SYNTHROID ) 100 MCG tablet Take 1 tablet (100 mcg total) by mouth daily before breakfast. 08/22/23  Yes Shamleffer, Ibtehal Jaralla, MD  LORazepam  (ATIVAN ) 1 MG tablet TAKE 1/2 TO 1 AND 1/2 TABLETS AT BEDTIME AND AN ADDITIONAL 1/2 TAB ONCE A DAY AS NEEDED FOR ANXIETY Patient taking differently: Take 0.5-1.5 mg by mouth See admin instructions. Take 0.5mg  (1/2 tablet) to 1.5mg  (1 and 1/2 tablet) by mouth at night as needed for  anxiety/sleep. May take an additional 0.5mg  (1/2 tablet) once a day as needed for anxiety. 11/13/23  Yes Mozingo, Regina Nattalie, NP  metoprolol  succinate (TOPROL -XL) 50 MG 24 hr tablet Take 2 tablets (100 mg total) by mouth daily. Take with or immediately following a meal Patient taking differently: Take 50 mg by mouth daily. Take with or immediately following a meal 10/17/23  Yes Lowne Chase, Candida Chalk, DO  potassium chloride  SA (KLOR-CON  M) 20 MEQ tablet Take 1 tablet (20 mEq total) by mouth daily. 10/31/23  Yes Roel Clarity R, DO  pregabalin  (LYRICA ) 50 MG capsule TAKE 1 CAPSULE BY MOUTH EVERY MORNING PLUS 1 CAP MIDDAY, PLUS 2 CAPS IN THE EVENING 11/13/23  Yes Hill, Marvina Slough, MD  QUEtiapine  (SEROQUEL ) 200 MG tablet Take 1 tablet (200 mg total) by mouth at bedtime. 04/24/23  Yes Marita Sidle A, NP  rosuvastatin  (CRESTOR ) 10 MG tablet TAKE 1 TABLET BY MOUTH ONCE DAILY FOR HEART HEALTH AND TO LOWER CHOLESTEROL 09/18/23  Yes Roel Clarity R, DO  torsemide  (DEMADEX ) 20 MG tablet Take 3 tablets (60 mg total) by mouth daily. 07/28/23  Yes Roel Clarity R, DO  zolpidem  (AMBIEN  CR) 12.5 MG CR tablet TAKE 1 TABLET BY MOUTH AT BEDTIME AS NEEDED FOR SLEEP Patient taking differently: Take 12.5 mg by mouth at bedtime. for sleep 11/13/23  Yes Mozingo, Regina Nattalie, NP  Accu-Chek Softclix Lancets lancets USE 1  TO CHECK GLUCOSE 4 TIMES DAILY 01/16/23   Crecencio Dodge, Yvonne R, DO  Blood Glucose Monitoring Suppl (ACCU-CHEK GUIDE) w/Device KIT USE   TO CHECK GLUCOSE UP TO 4 TIMES DAILY AS DIRECTED 11/11/22   Crecencio Dodge, Yvonne R, DO  glucose blood (ACCU-CHEK GUIDE) test strip USE 1 STRIP TO CHECK GLUCOSE 4 TIMES DAILY 11/04/22   Crecencio Dodge, Adel Holt R, DO  Insulin  Pen Needle (BD PEN NEEDLE NANO U/F) 32G X 4 MM MISC Use to inject insulin  08/22/23   Shamleffer, Julian Obey, MD  lidocaine  (LIDODERM ) 5 % Place 1 patch onto the skin daily. Remove & Discard patch within 12 hours or as directed by MD Patient not  taking: Reported on 12/31/2023 12/29/23   Kingsley, Victoria K, DO  magic mouthwash w/lidocaine  SOLN Take 5 mLs by mouth 4 (four) times daily as needed for mouth pain. Swish and Spit Patient not taking: Reported on 12/31/2023 12/28/23   Estill Hemming, DO  oxyCODONE  (ROXICODONE ) 5 MG immediate release tablet Take 1 tablet (5 mg total) by mouth every 6 (six) hours as needed. Patient not taking: Reported on 12/31/2023 12/29/23   Kingsley, Victoria K, DO  OXYGEN  Inhale 3-4 L/min into the lungs continuous.     [provider]  senna-docusate (SENOKOT-S) 8.6-50 MG tablet Take 1 tablet by mouth daily. Patient not taking: Reported  on 12/31/2023 12/29/23   Kingsley, Victoria K, DO      Allergies    Hydrocodone , Norvasc  [amlodipine  besylate], and Tizanidine     Review of Systems   Review of Systems  Physical Exam Updated Vital Signs BP 118/67 (BP Location: Right Arm)   Pulse 98   Temp 98 F (36.7 C)   Resp 15   Ht 5\' 3"  (1.6 m)   Wt 117.9 kg   SpO2 97%   BMI 46.06 kg/m  Physical Exam Constitutional:      Comments: Patient is alert with clear mental status GCS of 15.  Morbid obesity.  No respiratory distress at rest.  HENT:     Head: Normocephalic and atraumatic.     Mouth/Throat:     Mouth: Mucous membranes are dry.     Pharynx: Oropharynx is clear.  Eyes:     Extraocular Movements: Extraocular movements intact.     Pupils: Pupils are equal, round, and reactive to light.  Cardiovascular:     Rate and Rhythm: Normal rate and regular rhythm.  Pulmonary:     Effort: Pulmonary effort is normal.     Breath sounds: Normal breath sounds.  Abdominal:     General: There is no distension.     Palpations: Abdomen is soft.     Tenderness: There is no abdominal tenderness. There is no guarding.  Musculoskeletal:     Comments: Endorses a lot of pain in her right hip as we try to move and roll to examine her back and hips.  I am able to do some flexion extension of the right hip.  It  does not appear malaligned.  Does have some superficial abrasions to both knees.  No significant peripheral edema.  She has pain to the left foot with some mild lateral swelling.  No erythema or warmth.  Neurological:     Comments: Patient is alert and oriented x 3.  She seems to have a lot of mobility difficulty due to body habitus but no focal motor deficits.  Psychiatric:        Mood and Affect: Mood normal.     ED Results / Procedures / Treatments   Labs (all labs ordered are listed, but only abnormal results are displayed) Labs Reviewed  COMPREHENSIVE METABOLIC PANEL WITH GFR - Abnormal; Notable for the following components:      Result Value   Potassium 2.9 (*)    Glucose, Bld 359 (*)    Creatinine, Ser 2.06 (*)    Total Protein 8.6 (*)    Albumin  2.8 (*)    GFR, Estimated 25 (*)    All other components within normal limits  CBC WITH DIFFERENTIAL/PLATELET - Abnormal; Notable for the following components:   WBC 10.8 (*)    Neutro Abs 8.1 (*)    Abs Immature Granulocytes 0.10 (*)    All other components within normal limits  URINALYSIS, ROUTINE W REFLEX MICROSCOPIC - Abnormal; Notable for the following components:   Glucose, UA >=500 (*)    Hgb urine dipstick SMALL (*)    All other components within normal limits  RAPID URINE DRUG SCREEN, HOSP PERFORMED - Abnormal; Notable for the following components:   Benzodiazepines POSITIVE (*)    All other components within normal limits  CK - Abnormal; Notable for the following components:   Total CK 1,427 (*)    All other components within normal limits  GLUCOSE, CAPILLARY - Abnormal; Notable for the following components:   Glucose-Capillary 355 (*)  All other components within normal limits  CBG MONITORING, ED - Abnormal; Notable for the following components:   Glucose-Capillary 323 (*)    All other components within normal limits  I-STAT CG4 LACTIC ACID, ED - Abnormal; Notable for the following components:   Lactic Acid,  Venous 4.0 (*)    All other components within normal limits  I-STAT CG4 LACTIC ACID, ED - Abnormal; Notable for the following components:   Lactic Acid, Venous 3.1 (*)    All other components within normal limits  ETHANOL  MAGNESIUM   BASIC METABOLIC PANEL WITH GFR  CBC  CK  TROPONIN I (HIGH SENSITIVITY)  TROPONIN I (HIGH SENSITIVITY)    EKG None  Radiology DG Hip Unilat With Pelvis 2-3 Views Right Result Date: 12/31/2023 CLINICAL DATA:  Fall. Found down at home by family. On floor for 2 days. EXAM: DG HIP (WITH OR WITHOUT PELVIS) 2-3V RIGHT COMPARISON:  Pelvis and right hip radiographs 12/29/2023 FINDINGS: Moderate bilateral femoroacetabular joint space narrowing. No acute fracture. Mild bilateral sacroiliac subchondral sclerosis. The pubic symphysis joint space is maintained. 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 No acute fracture. Electronically Signed   By: Bertina Broccoli M.D.   On: 12/31/2023 18:19   DG Foot Complete Left Result Date: 12/31/2023 CLINICAL DATA:  Fall. Found at home by family in prone position. Down for 2 days. EXAM: LEFT FOOT - COMPLETE 3+ VIEW COMPARISON:  Left tibia and fibula radiographs 03/03/2023 FINDINGS: Mild hallux valgus. Old healed fracture of the distal shaft of the fourth metatarsal with mild medial displacement of the distal fracture component with respect to the proximal fracture component. Old healed fracture of the fifth metatarsal with approximately 4 mm dorsal displacement and minimal medial displacement of the distal fracture component with respect to the proximal fracture component. Tiny posterior calcaneal heel spur. No acute fracture or dislocation. IMPRESSION: 1. No acute fracture. 2. Old healed fractures of the fourth and fifth metatarsals. Electronically Signed   By: Bertina Broccoli M.D.   On: 12/31/2023 18:18   DG Chest Port 1 View Result Date: 12/31/2023 CLINICAL DATA:  Fall. Found at home by family in prone  position. Patient reports she has been on the floor for 2 days. EXAM: PORTABLE CHEST 1 VIEW COMPARISON:  AP chest 12/23/2022, 10/28/2022 FINDINGS: The heart size and mediastinal contours are within normal limits. Both lungs are clear. The visualized skeletal structures are unremarkable. IMPRESSION: No active disease. Electronically Signed   By: Bertina Broccoli M.D.   On: 12/31/2023 18:06    Procedures Procedures    Medications Ordered in ED Medications  lactated ringers  infusion ( Intravenous New Bag/Given 12/31/23 2254)  potassium chloride  10 mEq in 100 mL IVPB (10 mEq Intravenous New Bag/Given 12/31/23 2255)  acetaminophen  (TYLENOL ) tablet 1,000 mg (1,000 mg Oral Patient Refused/Not Given 12/31/23 2053)  allopurinol  (ZYLOPRIM ) tablet 100 mg (has no administration in time range)  metoprolol  succinate (TOPROL -XL) 24 hr tablet 100 mg (100 mg Oral Given 12/31/23 2137)  rosuvastatin  (CRESTOR ) tablet 10 mg (has no administration in time range)  busPIRone  (BUSPAR ) tablet 15 mg (15 mg Oral Given 12/31/23 2256)  QUEtiapine  (SEROQUEL ) tablet 200 mg (200 mg Oral Given 12/31/23 2256)  levothyroxine  (SYNTHROID ) tablet 100 mcg (has no administration in time range)  pregabalin  (LYRICA ) capsule 50 mg (50 mg Oral Given 12/31/23 2136)  enoxaparin  (LOVENOX ) injection 40 mg (40 mg Subcutaneous Given 12/31/23 2257)  acetaminophen  (TYLENOL ) tablet 650 mg (has no administration in time range)  Or  acetaminophen  (TYLENOL ) suppository 650 mg (has no administration in time range)  ondansetron  (ZOFRAN ) tablet 4 mg (has no administration in time range)    Or  ondansetron  (ZOFRAN ) injection 4 mg (has no administration in time range)  insulin  glargine-yfgn (SEMGLEE ) injection 45 Units (has no administration in time range)  insulin  aspart (novoLOG ) injection 0-24 Units (has no administration in time range)  ibuprofen  (ADVIL ) tablet 400 mg (has no administration in time range)  pregabalin  (LYRICA ) capsule 100 mg (100 mg Oral  Given 12/31/23 2256)  zolpidem  (AMBIEN ) tablet 5 mg (5 mg Oral Given 12/31/23 2256)  0.9 %  sodium chloride  infusion (0 mLs Intravenous Stopped 12/31/23 2200)  sodium chloride  0.9 % bolus 500 mL (0 mLs Intravenous Stopped 12/31/23 2247)  potassium chloride  (KLOR-CON ) packet 40 mEq (40 mEq Oral Given 12/31/23 2256)    ED Course/ Medical Decision Making/ A&P                                 Medical Decision Making Amount and/or Complexity of Data Reviewed Labs: ordered. Radiology: ordered.  Risk Prescription drug management. Decision regarding hospitalization.   Patient has had problems with weakness and deconditioning.  She has been having increasingly frequent falls.  She did have a fall with some prolonged time on the floor.  Will proceed with lab work and imaging of the hip and knees.  Patient did not strike her head and has baseline mental status.  At this time I do not think she needs CT head.  No neck pain or signs of focal neurologic deficit.  Total CK 1427.  Potassium 2.9 GFR 25 white count 10.8 H&H 13 and 41 magnesium  1.9 lactic 3.1.  Patient has generalized weakness and fall with hypokalemia, dehydration and mild rhabdomyolysis.  Will plan for admission for treatment and further evaluation for safety at home and need for possible rehab.  Patient's daughter advised that the patient overuses her pain and sedation medications which is a contributing factor.         Final Clinical Impression(s) / ED Diagnoses Final diagnoses:  Non-traumatic rhabdomyolysis  Dehydration  Hypokalemia    Rx / DC Orders ED Discharge Orders     None         Wynetta Heckle, MD 12/31/23 2312

## 2023-12-31 NOTE — H&P (Signed)
 History and Physical    Sue [name] WUJ:[id number] DOB: 10/16/1950 DOA: 12/31/2023  PCP: Estill Hemming, DO   Chief Complaint:  fall  HPI: Sue [name] is a 73 y.o. female with medical history significant of T2D, depression, hypothyroidism, prior stroke with dense emergency department for fall.  Patient had rolled out of bed and fallen between her nightstand and was unable to get up.  She is reporting on the ground for about 24 hours.  EMS was called and she was transported to the ER for further assessment.  On arrival she is hyperglycemic with glucose 328 3, potassium 2.9, creatinine 2.6, troponin 17, WBC 10.8, hemoglobin 13, CK 1427, magnesium  1.9, lactic acid 4.0.  Patient underwent hip x-ray which showed no acute fracture.  X-ray of foot showed no acute fracture.  Chest x-ray showed no acute findings.  Patient was started on IV fluids and admitted for further workup.  Upon discussion with patient's daughter they are reportedly been concern for excessive pain control usage.  This is subsequently resulted in intermittent episodes of confusion.   Review of Systems: Review of Systems  Constitutional:  Negative for chills and fever.  HENT: Negative.    Eyes: Negative.   Respiratory: Negative.    Cardiovascular: Negative.   Gastrointestinal: Negative.   Genitourinary: Negative.   Musculoskeletal: Negative.   Skin: Negative.   Neurological: Negative.   Endo/Heme/Allergies: Negative.      As per HPI otherwise 10 point review of systems negative.   Allergies  Allergen Reactions   Hydrocodone  Other (See Comments)    Constipation and hallucinations   Norvasc  [Amlodipine  Besylate] Swelling and Other (See Comments)    Marked swelling of the limbs   Tizanidine  Other (See Comments)    After the 3rd dose, the patient's mouth began to feel numb and she felt like she was a having a "hot flash"    Past Medical History:  Diagnosis Date   Allergic rhinitis    Depression     Diabetes mellitus type 2, uncontrolled    Emphysema of lung (HCC)    3L home O2   GERD (gastroesophageal reflux disease)    Hypertension    Hypothyroidism    Obesity, morbid, BMI 50 or higher (HCC)    Stroke (HCC) 2016   TIA    Urine incontinence     Past Surgical History:  Procedure Laterality Date   ABDOMINAL HYSTERECTOMY     CESAREAN SECTION     COLONOSCOPY N/A 08/19/2019   Procedure: COLONOSCOPY;  Surgeon: Ozell Blunt, MD;  Location: [facility];  Service: Endoscopy;  Laterality: N/A;   COLONOSCOPY WITH PROPOFOL  N/A 08/05/2019   Procedure: COLONOSCOPY WITH PROPOFOL ;  Surgeon: Baldo Bonds, MD;  Location: [facility];  Service: Gastroenterology;  Laterality: N/A;   COLONOSCOPY WITH PROPOFOL  N/A 11/19/2019   Procedure: COLONOSCOPY WITH PROPOFOL ;  Surgeon: Lanita Pitman, MD;  Location: [facility];  Service: Endoscopy;  Laterality: N/A;  Unprepped   COLONOSCOPY WITH PROPOFOL  N/A 11/22/2019   Procedure: COLONOSCOPY WITH PROPOFOL ;  Surgeon: Lanita Pitman, MD;  Location: [facility];  Service: Endoscopy;  Laterality: N/A;   COLONOSCOPY WITH PROPOFOL  N/A 11/23/2019   Procedure: COLONOSCOPY WITH PROPOFOL ;  Surgeon: Genell Ken, MD;  Location: [facility];  Service: Gastroenterology;  Laterality: N/A;   COLONOSCOPY WITH PROPOFOL  N/A 11/29/2019   Procedure: COLONOSCOPY WITH PROPOFOL ;  Surgeon: Baldo Bonds, MD;  Location: [facility];  Service: Endoscopy;  Laterality: N/A;   ENTEROSCOPY N/A 11/24/2019   Procedure: ENTEROSCOPY;  Surgeon: Genell Ken, MD;  Location: [facility];  Service: Gastroenterology;  Laterality: N/A;   ENTEROSCOPY N/A 11/27/2019   Procedure: ENTEROSCOPY;  Surgeon: Baldo Bonds, MD;  Location: [facility];  Service: Endoscopy;  Laterality: N/A;   ESOPHAGOGASTRODUODENOSCOPY N/A 11/27/2019   Procedure: ESOPHAGOGASTRODUODENOSCOPY (EGD);  Surgeon: Baldo Bonds, MD;  Location: [facility];  Service: Endoscopy;  Laterality: N/A;    ESOPHAGOGASTRODUODENOSCOPY (EGD) WITH PROPOFOL  N/A 11/24/2019   Procedure: ESOPHAGOGASTRODUODENOSCOPY (EGD) WITH PROPOFOL ;  Surgeon: Genell Ken, MD;  [facility];  Service: Gastroenterology;  Laterality: N/A;  PUSH enteroscopy   GIVENS CAPSULE STUDY N/A 11/19/2019   Procedure: GIVENS CAPSULE STUDY;  Surgeon: Lanita Pitman, MD;  [facility];  Service: Endoscopy;  Laterality: N/A;  To be performed immediately following colonoscopy   GIVENS CAPSULE STUDY N/A 11/24/2019   Procedure: GIVENS CAPSULE STUDY;  Surgeon: Genell Ken, MD;  [facility]: [facility];  Service: Gastroenterology;  Laterality: N/A;   GIVENS CAPSULE STUDY N/A 11/28/2019   Procedure: GIVENS CAPSULE STUDY;  Surgeon: Baldo Bonds, MD;  Location: [facility];  Service: Endoscopy;  Laterality: N/A;   HEMOSTASIS CLIP PLACEMENT  11/19/2019   Procedure: HEMOSTASIS CLIP PLACEMENT;  Surgeon: Lanita Pitman, MD;  [facility]: [facility];  Service: Endoscopy;;   HEMOSTASIS CLIP PLACEMENT  11/22/2019   Procedure: HEMOSTASIS CLIP PLACEMENT;  Surgeon: Lanita Pitman, MD;  [facility]: [facility];  Service: Endoscopy;;   HOT HEMOSTASIS N/A 11/24/2019   Procedure: HOT HEMOSTASIS (ARGON PLASMA COAGULATION/BICAP);  Surgeon: Genell Ken, MD;  Location: [facility];  Service: Gastroenterology;  Laterality: N/A;   HOT HEMOSTASIS N/A 11/27/2019   Procedure: HOT HEMOSTASIS (ARGON PLASMA COAGULATION/BICAP);  Surgeon: Baldo Bonds, MD;  Location: [facility];  Service: Endoscopy;  Laterality: N/A;   SUBMUCOSAL TATTOO INJECTION  11/19/2019   Procedure: SUBMUCOSAL TATTOO INJECTION;  Surgeon: Lanita Pitman, MD;  Location: [facility];  Service: Endoscopy;;     reports that she quit smoking about 12 years ago. Her smoking use included cigarettes. She started smoking about 51 years ago. She has a 40 pack-year smoking history. She has never used smokeless tobacco. She reports that she does not currently use alcohol. She reports that  she does not use drugs.  Family History  Problem Relation Age of Onset   Heart disease Father        MVP and Pics Valve   Hypertension Father    Depression Father        Institutionalized x's 2 years   Bipolar disorder Father    Hypertension Sister    Diabetes Sister    Hyperlipidemia Sister    Heart disease Sister 67       MI   Heart disease Brother    Hypertension Brother    Schizophrenia Paternal Aunt    Depression Paternal Aunt    Anxiety disorder Paternal Aunt    Heart disease Paternal Aunt    Schizophrenia Paternal Aunt    Heart disease Paternal Uncle    Heart disease Paternal Grandmother    Asthma Son    Asthma Son     Prior to Admission medications   Medication Sig Start Date End Date Taking? Authorizing Provider  Accu-Chek Softclix Lancets lancets USE 1  TO CHECK GLUCOSE 4 TIMES DAILY 01/16/23   Crecencio Dodge, Candida Chalk, DO  albuterol  (VENTOLIN  HFA) 108 (90 Base) MCG/ACT inhaler Inhale 1-2 puffs into the lungs every 6 (six) hours as needed for wheezing or shortness of breath. 12/23/22   Teddi Favors, DO  allopurinol  (ZYLOPRIM )  100 MG tablet Take 100 mg by mouth daily as needed (for gout flareups). 06/10/21   [provider]  amLODipine  (NORVASC ) 5 MG tablet Take by mouth. 11/14/17   [provider]  aspirin  EC 81 MG tablet Take by mouth. 05/03/17   [provider]  Blood Glucose Monitoring Suppl (ACCU-CHEK GUIDE) w/Device KIT USE   TO CHECK GLUCOSE UP TO 4 TIMES DAILY AS DIRECTED 11/11/22   Crecencio Dodge, Candida Chalk, DO  busPIRone  (BUSPAR ) 15 MG tablet Take 1 tablet (15 mg total) by mouth 2 (two) times daily. 04/24/23   Lincoln Renshaw, NP  diclofenac  Sodium (VOLTAREN  ARTHRITIS PAIN) 1 % GEL Apply 4 g topically 4 (four) times daily. 03/03/23   Estill Hemming, DO  Dulaglutide  (TRULICITY ) 3 MG/0.5ML SOAJ Inject 3 mg into the skin once a week. 08/22/23   Shamleffer, Ibtehal Jaralla, MD  empagliflozin  (JARDIANCE ) 10 MG TABS tablet Take 1 tablet (10 mg  total) by mouth daily before breakfast. 08/21/23   Shamleffer, Ibtehal Jaralla, MD  famotidine  (PEPCID ) 20 MG tablet Take 20 mg by mouth daily as needed for heartburn or indigestion.    [provider]  fluticasone  (FLONASE ) 50 MCG/ACT nasal spray 2 sprays. 05/02/17   [provider]  gabapentin  (NEURONTIN ) 100 MG capsule Take by mouth.    [provider]  glucose blood (ACCU-CHEK GUIDE) test strip USE 1 STRIP TO CHECK GLUCOSE 4 TIMES DAILY 11/04/22   Crecencio Dodge, Candida Chalk, DO  ibuprofen  (ADVIL ) 800 MG tablet Take by mouth. 07/28/22   [provider]  insulin  aspart (NOVOLOG  FLEXPEN) 100 UNIT/ML FlexPen Novolog  10 units with each meal PLUS the scale if needed -Novolog  correctional insulin : ADD extra units on insulin  to your meal-time Novolog  dose if your blood sugars are higher than 160. Use the scale below to help guide you:  Blood sugar before meal Number of units to inject Less than 160 0 unit 161 -  190 1 units 191 -  220 2 units 221 -  250 3 units 251 -  280 4 units 281 -  310 5 units 311 -  340 6 units 341 -  370 7 units 371 -  400 8 units Max daily 45 units 08/22/23   Shamleffer, Ibtehal Jaralla, MD  insulin  glargine, 1 Unit Dial, (TOUJEO  SOLOSTAR) 300 UNIT/ML Solostar Pen INJECT 64 UNITS SUBCUTANEOUSLY ONCE DAILY IN THE AFTERNOON.**APPOINTMENT NEEDED FOR FURTHER REFILLS** 09/18/23   Shamleffer, Ibtehal Jaralla, MD  Insulin  Pen Needle (BD PEN NEEDLE NANO U/F) 32G X 4 MM MISC Use to inject insulin  08/22/23   Shamleffer, Ibtehal Jaralla, MD  ketorolac (ACULAR) 0.5 % ophthalmic solution Place one drop into the left eye 4 (four) times daily. 11/25/22   [provider]  levothyroxine  (SYNTHROID ) 100 MCG tablet Take 1 tablet (100 mcg total) by mouth daily before breakfast. 08/22/23   Shamleffer, Ibtehal Jaralla, MD  lidocaine  (LIDODERM ) 5 % Place 1 patch onto the skin daily. Remove & Discard patch within 12 hours or as directed by MD 12/29/23   Kingsley, Victoria K,  DO  LORazepam  (ATIVAN ) 1 MG tablet TAKE 1/2 TO 1 AND 1/2 TABLETS AT BEDTIME AND AN ADDITIONAL 1/2 TAB ONCE A DAY AS NEEDED FOR ANXIETY 11/13/23   Mozingo, Regina Nattalie, NP  LORazepam  (ATIVAN ) 2 MG tablet Take by mouth. 09/24/10   [provider]  magic mouthwash w/lidocaine  SOLN Take 5 mLs by mouth 4 (four) times daily as needed for mouth pain. Swish and  Spit 12/28/23   Lowne Chase, Yvonne R, DO  Magnesium  400 MG CAPS Take 1 capsule by mouth daily. 01/14/20   Neda Balk, MD  metoprolol  succinate (TOPROL -XL) 50 MG 24 hr tablet Take 2 tablets (100 mg total) by mouth daily. Take with or immediately following a meal 10/17/23   Crecencio Dodge, Yvonne R, DO  moxifloxacin (VIGAMOX) 0.5 % ophthalmic solution Place one drop into the left eye 4 (four) times daily. 11/25/22   [provider]  oxyCODONE  (ROXICODONE ) 5 MG immediate release tablet Take 1 tablet (5 mg total) by mouth every 6 (six) hours as needed. 12/29/23   Kingsley, Victoria K, DO  OXYGEN  Inhale 3-4 L/min into the lungs continuous.     [provider]  potassium chloride  SA (KLOR-CON  M) 20 MEQ tablet Take 1 tablet (20 mEq total) by mouth daily. 10/31/23   Lowne Chase, Yvonne R, DO  prednisoLONE acetate (PRED FORTE) 1 % ophthalmic suspension Place one drop into the left eye 4 (four) times daily. 11/25/22   [provider]  pregabalin  (LYRICA ) 50 MG capsule TAKE 1 CAPSULE BY MOUTH EVERY MORNING PLUS 1 CAP MIDDAY, PLUS 2 CAPS IN THE EVENING 11/13/23   Ellene Gustin, MD  QUEtiapine  (SEROQUEL ) 200 MG tablet Take 1 tablet (200 mg total) by mouth at bedtime. 04/24/23   Lincoln Renshaw, NP  rosuvastatin  (CRESTOR ) 10 MG tablet TAKE 1 TABLET BY MOUTH ONCE DAILY FOR HEART HEALTH AND TO LOWER CHOLESTEROL 09/18/23   Crecencio Dodge, Yvonne R, DO  senna-docusate (SENOKOT-S) 8.6-50 MG tablet Take 1 tablet by mouth daily. 12/29/23   Kingsley, Victoria K, DO  Suvorexant  (BELSOMRA ) 15 MG TABS Take 1 tablet (15 mg total) by mouth at bedtime.  02/13/23   Lincoln Renshaw, NP  torsemide  (DEMADEX ) 20 MG tablet Take 3 tablets (60 mg total) by mouth daily. 07/28/23   Roel Clarity R, DO  zolpidem  (AMBIEN  CR) 12.5 MG CR tablet TAKE 1 TABLET BY MOUTH AT BEDTIME AS NEEDED FOR SLEEP 11/13/23   Mozingo, Regina Nattalie, NP  zolpidem  (AMBIEN ) 10 MG tablet Take by mouth. 09/24/10   [provider]    Physical Exam: Vitals:   12/31/23 1550 12/31/23 1615  BP:  (!) 155/76  Pulse:  (!) 101  Resp:  14  SpO2:  96%  Weight: 117.9 kg   Height: 5\' 3"  (1.6 m)    Physical Exam Constitutional:      Appearance: She is normal weight.  HENT:     Head: Normocephalic.     Nose: Nose normal.     Mouth/Throat:     Mouth: Mucous membranes are moist.     Pharynx: Oropharynx is clear.  Eyes:     Conjunctiva/sclera: Conjunctivae normal.     Pupils: Pupils are equal, round, and reactive to light.  Cardiovascular:     Rate and Rhythm: Normal rate and regular rhythm.  Pulmonary:     Effort: Pulmonary effort is normal.     Breath sounds: Normal breath sounds.  Abdominal:     General: Abdomen is flat. Bowel sounds are normal.  Musculoskeletal:        General: Normal range of motion.     Cervical back: Normal range of motion.  Skin:    Capillary Refill: Capillary refill takes less than 2 seconds.  Neurological:     General: No focal deficit present.     Mental Status: She is alert.  Psychiatric:  Mood and Affect: Mood normal.        Labs on Admission: I have personally reviewed the patients's labs and imaging studies.  Assessment/Plan Principal Problem:   Rhabdomyolysis   # Rhabdomyolysis - Patient fell who was reportedly down for 24 hours - CK greater than 1000 - Immobile at baseline -Creatinine at baseline  Plan: Continue IV fluids Recheck ck in morning  #T2D- placed on basal bolu sinsulin  #hypothyroidism- continue levothyroxine   #Htn- continue metoprolol   #Chronic pain- continue lyrica   #Insomnia-  continue seroquel , suvorexant   #HLD- cotninue statin.   #Prior CVA continue asa  $Hypokalemia- continue potassium   Admission status: Inpatient Telemetry  Certification: The appropriate patient status for this patient is INPATIENT. Inpatient status is judged to be reasonable and necessary in order to provide the required intensity of service to ensure the patient's safety. The patient's presenting symptoms, physical exam findings, and initial radiographic and laboratory data in the context of their chronic comorbidities is felt to place them at high risk for further clinical deterioration. Furthermore, it is not anticipated that the patient will be medically stable for discharge from the hospital within 2 midnights of admission.   * I certify that at the point of admission it is my clinical judgment that the patient will require inpatient hospital care spanning beyond 2 midnights from the point of admission due to high intensity of service, high risk for further deterioration and high frequency of surveillance required.Myrl Askew MD [facility] If 7PM-7AM, please contact night-coverage [email or web address]  12/31/2023, 8:03 PM

## 2023-12-31 NOTE — ED Notes (Signed)
 Pt. I-stat CG4 Lactic Acid results 3.1. EDP, Pfeiffer made aware.

## 2023-12-31 NOTE — ED Triage Notes (Signed)
 Pt arrives via EMS from home, was found at home on floor by family in prone position. Per pt she has been on the floor since Friday. Hx increasing number of falls, chronic hip pain  CBG 499, pt hasn't had home meds since Friday. EMS hung NS en route

## 2023-12-31 NOTE — ED Notes (Signed)
 Pt. I-stat CG4 Lactic Acid 4.0, EDP, Pfeiffer made aware.

## 2024-01-01 ENCOUNTER — Telehealth: Payer: Self-pay

## 2024-01-01 DIAGNOSIS — M6282 Rhabdomyolysis: Secondary | ICD-10-CM | POA: Diagnosis not present

## 2024-01-01 LAB — CBC
HCT: 37.1 % (ref 36.0–46.0)
Hemoglobin: 11.5 g/dL — ABNORMAL LOW (ref 12.0–15.0)
MCH: 30.3 pg (ref 26.0–34.0)
MCHC: 31 g/dL (ref 30.0–36.0)
MCV: 97.6 fL (ref 80.0–100.0)
Platelets: 156 10*3/uL (ref 150–400)
RBC: 3.8 MIL/uL — ABNORMAL LOW (ref 3.87–5.11)
RDW: 14.5 % (ref 11.5–15.5)
WBC: 10.2 10*3/uL (ref 4.0–10.5)
nRBC: 0 % (ref 0.0–0.2)

## 2024-01-01 LAB — CK: Total CK: 1021 U/L — ABNORMAL HIGH (ref 38–234)

## 2024-01-01 LAB — BASIC METABOLIC PANEL WITH GFR
Anion gap: 9 (ref 5–15)
BUN: 17 mg/dL (ref 8–23)
CO2: 27 mmol/L (ref 22–32)
Calcium: 8.6 mg/dL — ABNORMAL LOW (ref 8.9–10.3)
Chloride: 100 mmol/L (ref 98–111)
Creatinine, Ser: 1.78 mg/dL — ABNORMAL HIGH (ref 0.44–1.00)
GFR, Estimated: 30 mL/min — ABNORMAL LOW (ref >60)
Glucose, Bld: 269 mg/dL — ABNORMAL HIGH (ref 70–99)
Potassium: 3.3 mmol/L — ABNORMAL LOW (ref 3.5–5.1)
Sodium: 136 mmol/L (ref 135–145)

## 2024-01-01 LAB — GLUCOSE, CAPILLARY
Glucose-Capillary: 203 mg/dL — ABNORMAL HIGH (ref 70–99)
Glucose-Capillary: 161 mg/dL — ABNORMAL HIGH (ref 70–99)
Glucose-Capillary: 247 mg/dL — ABNORMAL HIGH (ref 70–99)
Glucose-Capillary: 197 mg/dL — ABNORMAL HIGH (ref 70–99)

## 2024-01-01 MED ORDER — POTASSIUM CHLORIDE CRYS ER 20 MEQ PO TBCR
20.0000 meq | EXTENDED_RELEASE_TABLET | Freq: Once | ORAL | Status: AC
  Administered 2024-01-01: 20 meq via ORAL
  Filled 2024-01-01: qty 1

## 2024-01-01 MED ORDER — OXYCODONE HCL 5 MG PO TABS
5.0000 mg | ORAL_TABLET | ORAL | Status: DC | PRN
  Administered 2024-01-06 (×2): 5 mg via ORAL
  Filled 2024-01-01 (×2): qty 1

## 2024-01-01 MED ORDER — INSULIN ASPART 100 UNIT/ML IJ SOLN: 0.0000 [IU] | Freq: Three times a day (TID) | INTRAMUSCULAR | Status: DC

## 2024-01-01 MED ORDER — INSULIN ASPART 100 UNIT/ML IJ SOLN: 0.0000 [IU] | Freq: Every day | INTRAMUSCULAR | Status: DC

## 2024-01-01 MED ORDER — POTASSIUM CHLORIDE 20 MEQ PO PACK
40.0000 meq | PACK | Freq: Once | ORAL | Status: AC
  Administered 2024-01-01: 40 meq via ORAL
  Filled 2024-01-01: qty 2

## 2024-01-01 MED ORDER — POTASSIUM CHLORIDE 10 MEQ/100ML IV SOLN
10.0000 meq | INTRAVENOUS | Status: DC
  Administered 2024-01-01: 10 meq via INTRAVENOUS
  Filled 2024-01-01 (×2): qty 100

## 2024-01-01 MED ORDER — INSULIN ASPART 100 UNIT/ML IJ SOLN
0.0000 [IU] | Freq: Three times a day (TID) | INTRAMUSCULAR | Status: DC
  Administered 2024-01-01: 5 [IU] via SUBCUTANEOUS
  Administered 2024-01-01: 3 [IU] via SUBCUTANEOUS
  Administered 2024-01-01: 5 [IU] via SUBCUTANEOUS
  Administered 2024-01-02: 2 [IU] via SUBCUTANEOUS
  Administered 2024-01-02 – 2024-01-03 (×3): 3 [IU] via SUBCUTANEOUS
  Administered 2024-01-04 – 2024-01-05 (×2): 2 [IU] via SUBCUTANEOUS
  Administered 2024-01-06: 3 [IU] via SUBCUTANEOUS
  Administered 2024-01-07: 2 [IU] via SUBCUTANEOUS

## 2024-01-01 MED ORDER — LACTATED RINGERS IV SOLN: INTRAVENOUS | Status: DC

## 2024-01-01 MED ORDER — METHOCARBAMOL 1000 MG/10ML IJ SOLN
500.0000 mg | Freq: Three times a day (TID) | INTRAMUSCULAR | Status: DC | PRN
  Administered 2024-01-08: 500 mg via INTRAVENOUS
  Filled 2024-01-01 (×2): qty 10

## 2024-01-01 NOTE — Progress Notes (Signed)
   01/01/24 1143  Spiritual Encounters  Type of Visit Initial  Care provided to: Family  Referral source Clinical staff   Per consult request by clinical team, I offered spiritual care support to the family (son, Sue [name]) of [name]. At time of visit [name] was sleeping.  Sue [name] debriefed with me around his mother's recurring symptoms and hopes for resolving those.  I provided support through active listening. I reviewed the HCPOA document and invited some discussion around views on care decisions and having those conversations with mom and family. I encouraged self-care as an adult child taking on some caregiver responsibilities. I shared how to contact spiritual care support through the care team as needed.  Sue [name] L. Minetta Aly, M.Div [phone]

## 2024-01-01 NOTE — Telephone Encounter (Signed)
 Copied from CRM [id number]. Topic: General - Other >> Jan 01, 2024  9:43 AM Sue [name] wrote: Reason for CRM: Patient was found face down at her home, she had been laying there for 24 hours. She urinated on herself and not in a good mental state. Her daughter Glady Laming is wanting Dr Jalene Mayor to be involved in her care and states they are also getting a POA arranged as her mother is not able to make sound decisions any longer. [phone] Please call back to daughters phone. She also wanted to note that her mother is inpatient right now as [place]. She is wanting to get some sort of occupational/physical therapy set up for her mother.

## 2024-01-01 NOTE — Progress Notes (Signed)
 OT Cancellation Note  Patient Details Name: Sue [name] MRN: [id number] DOB: 1950-10-18   Cancelled Treatment:    Reason Eval/Treat Not Completed: Pain limiting ability to participate OT attempted initial OT evaluation this pm. Patient declining eval d/t reporting L UE pain/burning. Nursing and son present bedside. OT will re-attempt eval when able to participate and when OT schedule allows.   Shakenya Stoneberg OT/L Acute Rehabilitation Department  [phone]  01/01/2024, 3:06 PM

## 2024-01-01 NOTE — Evaluation (Signed)
 Physical Therapy Evaluation Patient Details Name: Sue [name] MRN: [id number] DOB: 02/17/1951 Today's Date: 01/01/2024  History of Present Illness  73 y.o. female  Patient had rolled out of bed and fallen between her nightstand and was unable to get up. She reported being on the ground for about 24 hours. admitted with  Rhabdomyolysis. PMH: T2D, depression, hypothyroidism, prior stroke, morbid obesity, HTN, COPD  Clinical Impression  Pt admitted with above diagnosis.  Pt agreeable to PT, asking to sit up; pt reports she lives alone and is independent/mod I  at baseline; assisted pt to EOB with mod assist, STS and step pivot  transfer with min-mod assist. Significantly incr time needed for all mobility. Pt son present for PT session, very supportive and helpful. Will continue to follow in acute setting.  Patient will likely benefit from continued follow up therapy, <3 hours/day at d/c    Pt currently with functional limitations due to the deficits listed below (see PT Problem List). Pt will benefit from acute skilled PT to increase their independence and safety with mobility to allow discharge.           If plan is discharge home, recommend the following: A lot of help with walking and/or transfers;A lot of help with bathing/dressing/bathroom;Assistance with cooking/housework;Assist for transportation;Help with stairs or ramp for entrance   Can travel by private vehicle   No    Equipment Recommendations None recommended by PT  Recommendations for Other Services       Functional Status Assessment Patient has had a recent decline in their functional status and demonstrates the ability to make significant improvements in function in a reasonable and predictable amount of time.     Precautions / Restrictions Precautions Precautions: Fall Restrictions Weight Bearing Restrictions Per Provider Order: No      Mobility  Bed Mobility Overal bed mobility: Needs Assistance Bed Mobility:  Supine to Sit     Supine to sit: Mod assist, +2 for physical assistance, +2 for safety/equipment, Used rails, HOB elevated     General bed mobility comments: incr time and effort; assist to progress bil LEs off bed and to elevate trunk; heavy reliance on rails. bed pad used to rotate hips and  complete scooting to EOB    Transfers Overall transfer level: Needs assistance Equipment used: Rolling walker (2 wheels) Transfers: Sit to/from Stand, Bed to chair/wheelchair/BSC Sit to Stand: Mod assist, Min assist, +2 safety/equipment   Step pivot transfers: Min assist, +2 safety/equipment       General transfer comment: multi-modal cues for hand placement and to power up with LEs, incr time needed; cues to sequence and progress pivotal steps for  bed to chair transfer    Ambulation/Gait                  Stairs            Wheelchair Mobility     Tilt Bed    Modified Rankin (Stroke Patients Only)       Balance Overall balance assessment: Needs assistance Sitting-balance support: No upper extremity supported, Feet supported Sitting balance-Leahy Scale: Fair     Standing balance support: Reliant on assistive device for balance, During functional activity Standing balance-Leahy Scale: Poor                               Pertinent Vitals/Pain Pain Assessment Pain Assessment: Faces Faces Pain Scale: Hurts little more Pain Location:  denies pain at rest, c/o diffuse pain with movement, R hip pain Pain Intervention(s): Monitored during session, Repositioned    Home Living Family/patient expects to be discharged to:: Unsure Living Arrangements: Alone   Type of Home: Apartment Home Access: Level entry         Home Equipment: Rollator (4 wheels) Additional Comments: O2 dependent at baseline    Prior Function Prior Level of Function : History of Falls (last six months);Independent/Modified Independent             Mobility Comments: reports 2  recent falls;       Extremity/Trunk Assessment   Upper Extremity Assessment Upper Extremity Assessment: Overall WFL for tasks assessed    Lower Extremity Assessment Lower Extremity Assessment: Generalized weakness (AAROM limited d/t body habitus; strength grossly 2+ to 3/5 bil)       Communication   Communication Communication: No apparent difficulties    Cognition Arousal: Alert Behavior During Therapy: WFL for tasks assessed/performed   PT - Cognitive impairments: Sequencing, Initiation, Problem solving                       PT - Cognition Comments: processing delayed Following commands: (P) Impaired Following commands impaired: Follows one step commands with increased time     Cueing Cueing Techniques: Verbal cues, Tactile cues     General Comments      Exercises     Assessment/Plan    PT Assessment Patient needs continued PT services  PT Problem List Decreased strength;Decreased range of motion;Decreased activity tolerance;Decreased balance;Decreased mobility;Pain       PT Treatment Interventions DME instruction;Gait training;Functional mobility training;Therapeutic activities;Therapeutic exercise;Patient/family education    PT Goals (Current goals can be found in the Care Plan section)  Acute Rehab PT Goals PT Goal Formulation: With patient Time For Goal Achievement: 01/15/24 Potential to Achieve Goals: Good    Frequency Min 3X/week     Co-evaluation               AM-PAC PT "6 Clicks" Mobility  Outcome Measure Help needed turning from your back to your side while in a flat bed without using bedrails?: A Lot Help needed moving from lying on your back to sitting on the side of a flat bed without using bedrails?: A Lot Help needed moving to and from a bed to a chair (including a wheelchair)?: A Lot Help needed standing up from a chair using your arms (e.g., wheelchair or bedside chair)?: A Lot Help needed to walk in hospital room?: A  Lot Help needed climbing 3-5 steps with a railing? : Total 6 Click Score: 11    End of Session Equipment Utilized During Treatment: Gait belt Activity Tolerance: Patient limited by fatigue;Patient limited by pain Patient left: in chair;with call bell/phone within reach;with family/visitor present;with chair alarm set   PT Visit Diagnosis: Other abnormalities of gait and mobility (R26.89)    Time: 4098-1191 PT Time Calculation (min) (ACUTE ONLY): 25 min   Charges:   PT Evaluation $PT Eval Low Complexity: 1 Low PT Treatments $Therapeutic Activity: 8-22 mins PT General Charges $$ ACUTE PT VISIT: 1 Visit         Alexandria Ida, PT  Acute Rehab Dept [facility]) [phone]  01/01/2024   [facility] 01/01/2024, 4:57 PM

## 2024-01-01 NOTE — TOC Initial Note (Signed)
 Transition of Care [facility]) - Initial/Assessment Note    Patient Details  Name: Sue [name] MRN: [id number] Date of Birth: [birth year]  Transition of [facility]) CM/SW Contact:    Bari Leys, RN Phone Number: 01/01/2024, 9:29 AM  Clinical Narrative:  Met with patient at bedside to introduce role of TOC/NCM and review for dc planning, pt confirmed she has a nurse that comes in 4 hrs week to  check her vital signs/assessment, reports she has RW and shower chair at home, reports she resides alone with assistance from her daughter. PT eval pending, await recommendation. TOC will continue to follow.                  Expected Discharge Plan: Home w Home Health Services Barriers to Discharge: Continued Medical Work up   Patient Goals and CMS Choice Patient states their goals for this hospitalization and ongoing recovery are:: return home          Expected Discharge Plan and Services       Living arrangements for the past 2 months: Apartment                                      Prior Living Arrangements/Services Living arrangements for the past 2 months: Apartment Lives with:: Self Patient language and need for interpreter reviewed:: Yes Do you feel safe going back to the place where you live?: Yes      Need for Family Participation in Patient Care: Yes (Comment) Care giver support system in place?: Yes (comment) Current home services: DME (walker, shower chair) Criminal Activity/Legal Involvement Pertinent to Current Situation/Hospitalization: No - Comment as needed  Activities of Daily Living   ADL Screening (condition at time of admission) Independently performs ADLs?: Yes (appropriate for developmental age) Is the patient deaf or have difficulty hearing?: Yes Does the patient have difficulty seeing, even when wearing glasses/contacts?: No Does the patient have difficulty concentrating, remembering, or making decisions?: No  Permission Sought/Granted                   Emotional Assessment Appearance:: Appears stated age Attitude/Demeanor/Rapport: Engaged Affect (typically observed): Accepting Orientation: : Oriented to Self, Oriented to Place, Oriented to  Time, Oriented to Situation Alcohol  / Substance Use: Not Applicable Psych Involvement: No (comment)  Admission diagnosis:  Rhabdomyolysis [M62.82] Dehydration [E86.0] Hypokalemia [E87.6] Non-traumatic rhabdomyolysis [M62.82] Patient Active Problem List   Diagnosis Date Noted   Rhabdomyolysis 12/31/2023   Acute encephalopathy 09/02/2022   Hypotension 09/02/2022   Type 2 diabetes mellitus with hyperglycemia, with long-term current use of insulin  (HCC) 04/29/2022   Neuropathy 03/14/2022   Lower extremity edema 07/16/2021   Need for influenza vaccination 07/16/2021   Type 2 diabetes mellitus with stage 3b chronic kidney disease, with long-term current use of insulin  (HCC) 05/18/2021   Type 2 diabetes mellitus with diabetic polyneuropathy, with long-term current use of insulin  (HCC) 05/18/2021   Urine incontinence 05/10/2021   Rash 04/05/2021   Palpitation 04/05/2021   Acute bronchitis due to COVID-19 virus 03/09/2021   Hyperlipidemia 02/01/2021   Left knee pain 02/01/2021   Palpitations 02/01/2021   Chronic pain of both shoulders 05/29/2020   Chronic diastolic heart failure (HCC) 01/08/2020   CKD (chronic kidney disease) stage 3, GFR 30-59 ml/min (HCC) 11/13/2019   Acute blood loss anemia 11/13/2019   Lower GI bleed 08/16/2019   Symptomatic anemia 08/03/2019  QT prolongation 08/03/2019   GI bleed 08/02/2019   Diabetes mellitus with renal complications (HCC) 05/29/2019   Hyperlipidemia associated with type 2 diabetes mellitus (HCC) 05/29/2019   DVT (deep venous thrombosis) (HCC) 03/17/2019   Acute on chronic renal insufficiency 03/17/2019   Macrocytic anemia 03/17/2019   Small bowel obstruction (HCC) 03/08/2019   Fracture of left tibia 02/25/2019   Left tibial fracture  02/25/2019   Hyperglycemia 08/16/2018   AKI (acute kidney injury) (HCC) 08/15/2018   Morbid obesity due to excess calories (HCC) 05/09/2018   Acute bronchitis with COPD (HCC) 10/19/2017   Atypical chest pain 05/01/2017   Pulmonary emphysema (HCC) 04/06/2017   Chronic seasonal allergic rhinitis 04/06/2017   GERD (gastroesophageal reflux disease) 04/06/2017   Snoring 04/06/2017   Myalgia 02/01/2017   Arthralgia 02/01/2017   Chronic pain of toe of left foot 11/14/2016   Chronic hepatitis C without hepatic coma (HCC) 08/01/2016   Hypothyroidism 10/29/2015   Mild diastolic dysfunction 01/01/2015   Edema 01/01/2015   Edema of both ankles 12/24/2014   Stroke (HCC) 2016   TIA (transient ischemic attack) 07/30/2014   Weakness 07/29/2014   Flank pain 11/12/2013   Chronic respiratory failure (HCC) 10/25/2013   DOE (dyspnea on exertion) 10/25/2013   Depression with anxiety 10/15/2013   Insomnia 10/15/2013   Essential hypertension 10/09/2013   Hypokalemia 10/09/2013   PCP:  Estill Hemming, DO Pharmacy:  No Pharmacies Listed    Social Drivers of Health (SDOH) Social History: SDOH Screenings   Food Insecurity: No Food Insecurity (12/31/2023)  Housing: Low Risk  (01/01/2024)  Transportation Needs: No Transportation Needs (12/31/2023)  Utilities: Not At Risk (12/31/2023)  Alcohol Screen: Low Risk  (01/27/2023)  Depression (PHQ2-9): Medium Risk (07/04/2023)  Financial Resource Strain: Low Risk  (01/24/2022)  Physical Activity: Inactive (07/04/2023)  Social Connections: Unknown (01/01/2024)  Stress: Stress Concern Present (07/04/2023)  Tobacco Use: Medium Risk (12/31/2023)   SDOH Interventions:     Readmission Risk Interventions    01/01/2024    9:25 AM  Readmission Risk Prevention Plan  Transportation Screening Complete  PCP or Specialist Appt within 5-7 Days Complete  Home Care Screening Complete  Medication Review (RN CM) Complete

## 2024-01-01 NOTE — Inpatient Diabetes Management (Addendum)
 Inpatient Diabetes Program Recommendations  AACE/ADA: New Consensus Statement on Inpatient Glycemic Control (2015)  Target Ranges:  Prepandial:   less than 140 mg/dL      Peak postprandial:   less than 180 mg/dL (1-2 hours)      Critically ill patients:  140 - 180 mg/dL   Lab Results  Component Value Date   GLUCAP 203 (H) 01/01/2024   HGBA1C 10.3 (H) 11/07/2023    Review of Glycemic Control  Latest Reference Range & Units 12/31/23 15:55 12/31/23 22:06 01/01/24 07:51  Glucose-Capillary 70 - 99 mg/dL 161 (H) 096 (H) 045 (H)  (H): Data is abnormally high  Diabetes history: DM2 Outpatient Diabetes medications:  Toujeo  74 units every day Novolog  10 units TID + 1-8 units TID correction Jardiance  10 mg every day Trulicity  3 mg weekly Current orders for Inpatient glycemic control:  Semglee  45 units every day Novolog  0-24 units TID  Inpatient Diabetes Program Recommendations:    Please consider decreasing correction insulin  scale:  Novolog  0-15 units TID and 0-5 units at bedtime. Change diet to carb modified.  Will continue to follow while inpatient.  Thank you, Hays Lipschutz, MSN, CDCES Diabetes Coordinator Inpatient Diabetes Program [phone] (team pager from 8a-5p)

## 2024-01-01 NOTE — Progress Notes (Signed)
 PROGRESS NOTE  Sue [name]  DOB: 05-14-1951  PCP: Estill Hemming, DO WUJ:[id number]  DOA: 12/31/2023  LOS: 1 day  Hospital Day: 2  Brief narrative: Sue [name] is a 73 y.o. female with PMH significant for morbid obesity, DM2, HTN, stroke/TIA, COPD on 3 L oxygen , GERD, hypothyroidism. 4/20, patient was brought to the ED for a fall. Patient had rolled out of bed and fallen between her nightstand and was unable to get up.  She was reportedly on the ground for more than 24 hours until her family found her.    On initial workup, she was noted to have hyperglycemia, hypokalemia, elevated creatinine, elevated lactic acid and elevated CK Skeletal survey was unremarkable for any fracture. Patient started on IV fluid Per patient's daughter, there was concern of excessive pain control usage resulting to intermittent episodes of confusion.   Subjective: Patient was seen and examined this morning.  Pleasant elderly African-American female.  Propped up in bed.  Somnolent, says she is trying to catch up lost sleep from last 3 days. Her son was at bedside. Chart reviewed. Blood pressure 90s Labs from this morning with potassium low at 3.3, creatinine improving at 1.78, CK improving up to 1021  Assessment and plan: Rhabdomyolysis Because of being on the floor for more than 24-hour duration CK level gradually downtrending on IV fluid.  Continue same Recent Labs  Lab 12/31/23 1706 01/01/24 0347  CKTOTAL 1,427* 1,021*    Hypokalemia Potassium level this morning low at 3.3.  Replacement given. Recent Labs  Lab 12/31/23 1706 01/01/24 0347  K 2.9* 3.3*  MG 1.9  --    Acute metabolic encephalopathy  chronic pain Insomnia Remains somnolent this morning.   PTA meds-  Lyrica  3 times daily, Seroquel  nightly, Ambien  nightly  Ativan  scheduled and as needed, oxycodone  as needed, For now we will continue Ambien  nightly.  Keep others on hold  Type 2 diabetes mellitus A1c 10.3 on  11/07/2023 PTA meds-Trulicity , Jardiance , Lantus  74 units daily, sliding scale insulin  Currently on Semglee  45 units nightly and SSI/Accu-Cheks Recent Labs  Lab 12/31/23 1555 12/31/23 2206 01/01/24 0751 01/01/24 1158  GLUCAP 323* 355* 203* 161*   Hypertension Continue metoprolol  On hold torsemide   Prior CVA  HLD Continue aspirin , statin  COPD On 3 L oxygen  at home. Continue bronchodilators   Hypothyroidism Continue Synthroid    Mobility: Encourage ambulation.  PT eval ordered  Goals of care   Code Status: Full Code     DVT prophylaxis:  enoxaparin  (LOVENOX ) injection 40 mg Start: 12/31/23 2200 SCDs Start: 12/31/23 1955   Antimicrobials: None Fluid: LR at 125 mL/h Consultants: None Family Communication: Son at bedside  Status: Inpatient Level of care:  Telemetry   Patient is from: Home Needs to continue in-hospital care: Needs IV hydration, lab monitoring Anticipated d/c to: Hopefully home with home health per family's wish     Diet:  Diet Order             Diet Carb Modified Fluid consistency: Thin; Room service appropriate? Yes  Diet effective now                   Scheduled Meds:  acetaminophen   1,000 mg Oral Once   enoxaparin  (LOVENOX ) injection  40 mg Subcutaneous QHS   insulin  aspart  0-15 Units Subcutaneous TID WC   insulin  aspart  0-5 Units Subcutaneous QHS   insulin  glargine-yfgn  45 Units Subcutaneous QHS   levothyroxine   100 mcg  Oral QAC breakfast   metoprolol  succinate  100 mg Oral Daily   pregabalin   100 mg Oral QHS   pregabalin   50 mg Oral BID   zolpidem   5 mg Oral QHS    PRN meds: acetaminophen  **OR** acetaminophen , allopurinol , ibuprofen , methocarbamol  (ROBAXIN ) injection, ondansetron  **OR** ondansetron  (ZOFRAN ) IV, oxyCODONE    Infusions:   lactated ringers  125 mL/hr at 01/01/24 1116   potassium chloride  10 mEq (01/01/24 1409)    Antimicrobials: Anti-infectives (From admission, onward)    None        Objective: Vitals:   01/01/24 0652 01/01/24 1357  BP: (!) 97/58 (!) 111/50  Pulse: 86 72  Resp: 17 20  Temp: 98.8 F (37.1 C) 98 F (36.7 C)  SpO2: 99% 95%    Intake/Output Summary (Last 24 hours) at 01/01/2024 1444 Last data filed at 01/01/2024 1100 Gross per 24 hour  Intake 1080 ml  Output 500 ml  Net 580 ml   Filed Weights   12/31/23 1550  Weight: 117.9 kg   Weight change:  Body mass index is 46.06 kg/m.   Physical Exam: General exam: Pleasant, elderly African-American female Skin: No rashes, lesions or ulcers. HEENT: Atraumatic, normocephalic, no obvious bleeding Lungs: Clear to auscultation bilaterally,  CVS: S1, S2, no murmur,   GI/Abd: Soft, nontender, nondistended, bowel sound present,   CNS: Somnolent, opens eyes on command.  Not in distress Psychiatry: Mood appropriate,  Extremities: No pedal edema, no calf tenderness,   Data Review: I have personally reviewed the laboratory data and studies available.  F/u labs ordered Unresulted Labs (From admission, onward)     Start     Ordered   01/02/24 0500  Basic metabolic panel with GFR  Tomorrow morning,   R        01/01/24 0840   01/02/24 0500  CBC with Differential/Platelet  Tomorrow morning,   R        01/01/24 0840   01/02/24 0500  CK  Tomorrow morning,   R        01/01/24 0840   01/02/24 0500  Lactic acid, plasma  (Lactic Acid)  Tomorrow morning,   R        01/01/24 0840            Total time spent in review of labs and imaging, patient evaluation, formulation of plan, documentation and communication with family: 55 minutes  Signed, Hoyt Macleod, MD [facility] 01/01/2024

## 2024-01-01 NOTE — Plan of Care (Signed)
   Problem: Education: Goal: Knowledge of General Education information will improve Description Including pain rating scale, medication(s)/side effects and non-pharmacologic comfort measures Outcome: Progressing   Problem: Health Behavior/Discharge Planning: Goal: Ability to manage health-related needs will improve Outcome: Progressing

## 2024-01-01 NOTE — Plan of Care (Signed)
   Problem: Activity: Goal: Risk for activity intolerance will decrease Outcome: Progressing   Problem: Pain Managment: Goal: General experience of comfort will improve and/or be controlled Outcome: Progressing   Problem: Safety: Goal: Ability to remain free from injury will improve Outcome: Progressing

## 2024-01-02 DIAGNOSIS — M6282 Rhabdomyolysis: Secondary | ICD-10-CM | POA: Diagnosis not present

## 2024-01-02 LAB — BASIC METABOLIC PANEL WITH GFR
Anion gap: 8 (ref 5–15)
BUN: 17 mg/dL (ref 8–23)
CO2: 30 mmol/L (ref 22–32)
Calcium: 8.8 mg/dL — ABNORMAL LOW (ref 8.9–10.3)
Chloride: 95 mmol/L — ABNORMAL LOW (ref 98–111)
Creatinine, Ser: 1.62 mg/dL — ABNORMAL HIGH (ref 0.44–1.00)
GFR, Estimated: 34 mL/min — ABNORMAL LOW (ref >60)
Glucose, Bld: 171 mg/dL — ABNORMAL HIGH (ref 70–99)
Potassium: 3.3 mmol/L — ABNORMAL LOW (ref 3.5–5.1)
Sodium: 133 mmol/L — ABNORMAL LOW (ref 135–145)

## 2024-01-02 LAB — CBC WITH DIFFERENTIAL/PLATELET
Abs Immature Granulocytes: 0.04 10*3/uL (ref 0.00–0.07)
Basophils Absolute: 0 10*3/uL (ref 0.0–0.1)
Basophils Relative: 0 %
Eosinophils Absolute: 0.3 10*3/uL (ref 0.0–0.5)
Eosinophils Relative: 4 %
HCT: 38.5 % (ref 36.0–46.0)
Hemoglobin: 11.8 g/dL — ABNORMAL LOW (ref 12.0–15.0)
Immature Granulocytes: 1 %
Lymphocytes Relative: 23 %
Lymphs Abs: 1.6 10*3/uL (ref 0.7–4.0)
MCH: 30.3 pg (ref 26.0–34.0)
MCHC: 30.6 g/dL (ref 30.0–36.0)
MCV: 98.7 fL (ref 80.0–100.0)
Monocytes Absolute: 0.6 10*3/uL (ref 0.1–1.0)
Monocytes Relative: 9 %
Neutro Abs: 4.5 10*3/uL (ref 1.7–7.7)
Neutrophils Relative %: 63 %
Platelets: 175 10*3/uL (ref 150–400)
RBC: 3.9 MIL/uL (ref 3.87–5.11)
RDW: 14.6 % (ref 11.5–15.5)
WBC: 7.1 10*3/uL (ref 4.0–10.5)
nRBC: 0.3 % — ABNORMAL HIGH (ref 0.0–0.2)

## 2024-01-02 LAB — GLUCOSE, CAPILLARY
Glucose-Capillary: 161 mg/dL — ABNORMAL HIGH (ref 70–99)
Glucose-Capillary: 147 mg/dL — ABNORMAL HIGH (ref 70–99)
Glucose-Capillary: 177 mg/dL — ABNORMAL HIGH (ref 70–99)
Glucose-Capillary: 175 mg/dL — ABNORMAL HIGH (ref 70–99)

## 2024-01-02 LAB — CK: Total CK: 606 U/L — ABNORMAL HIGH (ref 38–234)

## 2024-01-02 LAB — LACTIC ACID, PLASMA: Lactic Acid, Venous: 1.3 mmol/L (ref 0.5–1.9)

## 2024-01-02 MED ORDER — ALUM & MAG HYDROXIDE-SIMETH 200-200-20 MG/5ML PO SUSP
30.0000 mL | Freq: Four times a day (QID) | ORAL | Status: DC | PRN
  Administered 2024-01-02 – 2024-01-03 (×2): 30 mL via ORAL
  Filled 2024-01-02 (×3): qty 30

## 2024-01-02 NOTE — TOC PASRR Note (Signed)
 30 Day PASRR Note   Patient Details  Name: Sue [name] Date of Birth: Jan 14, 1951   Transition of Care [facility]) CM/SW Contact:    Bari Leys, RN Phone Number: 01/02/2024, 1:01 PM  To Whom It May Concern:  Please be advised that this patient will require a short-term nursing home stay - anticipated 30 days or less for rehabilitation and strengthening.   The plan is for return home.

## 2024-01-02 NOTE — Plan of Care (Signed)
  Problem: Education: Goal: Knowledge of General Education information will improve Description: Including pain rating scale, medication(s)/side effects and non-pharmacologic comfort measures Outcome: Progressing   Problem: Health Behavior/Discharge Planning: Goal: Ability to manage health-related needs will improve Outcome: Progressing   Problem: Clinical Measurements: Goal: Ability to maintain clinical measurements within normal limits will improve Outcome: Progressing Goal: Will remain free from infection Outcome: Progressing Goal: Diagnostic test results will improve Outcome: Progressing Goal: Respiratory complications will improve Outcome: Progressing Goal: Cardiovascular complication will be avoided Outcome: Progressing   Problem: Activity: Goal: Risk for activity intolerance will decrease Outcome: Progressing   Problem: Nutrition: Goal: Adequate nutrition will be maintained Outcome: Adequate for Discharge   Problem: Coping: Goal: Level of anxiety will decrease Outcome: Progressing   Problem: Elimination: Goal: Will not experience complications related to bowel motility Outcome: Completed/Met Goal: Will not experience complications related to urinary retention Outcome: Completed/Met   Problem: Pain Managment: Goal: General experience of comfort will improve and/or be controlled Outcome: Progressing   Problem: Safety: Goal: Ability to remain free from injury will improve Outcome: Progressing   Problem: Skin Integrity: Goal: Risk for impaired skin integrity will decrease Outcome: Progressing

## 2024-01-02 NOTE — [place] FL2 (Addendum)
 [place]  MEDICAID FL2 LEVEL OF CARE FORM     IDENTIFICATION  Patient Name: Sue [name] Birthdate: February 18, 1951 Sex: female Admission Date (Current Location): 12/31/2023  [facility] and [place] Number:  Producer, television/film/video and Address:  [facility],  [address], [place] [address]      Provider Number: [id number]  Attending Physician Name and Address:  Hoyt Macleod, MD  Relative Name and Phone Number:  Ruthanne Covey (Daughter)  [phone] (Home Phone)    Current Level of Care: Hospital Recommended Level of Care: Skilled Nursing Facility Prior Approval Number:    Date Approved/Denied:   PASRR Number: [id number]    Discharge Plan: SNF    Current Diagnoses: Patient Active Problem List   Diagnosis Date Noted   Rhabdomyolysis 12/31/2023   Acute encephalopathy 09/02/2022   Hypotension 09/02/2022   Type 2 diabetes mellitus with hyperglycemia, with long-term current use of insulin  (HCC) 04/29/2022   Neuropathy 03/14/2022   Lower extremity edema 07/16/2021   Need for influenza vaccination 07/16/2021   Type 2 diabetes mellitus with stage 3b chronic kidney disease, with long-term current use of insulin  (HCC) 05/18/2021   Type 2 diabetes mellitus with diabetic polyneuropathy, with long-term current use of insulin  (HCC) 05/18/2021   Urine incontinence 05/10/2021   Rash 04/05/2021   Palpitation 04/05/2021   Acute bronchitis due to COVID-19 virus 03/09/2021   Hyperlipidemia 02/01/2021   Left knee pain 02/01/2021   Palpitations 02/01/2021   Chronic pain of both shoulders 05/29/2020   Chronic diastolic heart failure (HCC) 01/08/2020   CKD (chronic kidney disease) stage 3, GFR 30-59 ml/min (HCC) 11/13/2019   Acute blood loss anemia 11/13/2019   Lower GI bleed 08/16/2019   Symptomatic anemia 08/03/2019   QT prolongation 08/03/2019   GI bleed 08/02/2019   Diabetes mellitus with renal complications (HCC) 05/29/2019   Hyperlipidemia associated with type 2  diabetes mellitus (HCC) 05/29/2019   DVT (deep venous thrombosis) (HCC) 03/17/2019   Acute on chronic renal insufficiency 03/17/2019   Macrocytic anemia 03/17/2019   Small bowel obstruction (HCC) 03/08/2019   Fracture of left tibia 02/25/2019   Left tibial fracture 02/25/2019   Hyperglycemia 08/16/2018   AKI (acute kidney injury) (HCC) 08/15/2018   Morbid obesity due to excess calories (HCC) 05/09/2018   Acute bronchitis with COPD (HCC) 10/19/2017   Atypical chest pain 05/01/2017   Pulmonary emphysema (HCC) 04/06/2017   Chronic seasonal allergic rhinitis 04/06/2017   GERD (gastroesophageal reflux disease) 04/06/2017   Snoring 04/06/2017   Myalgia 02/01/2017   Arthralgia 02/01/2017   Chronic pain of toe of left foot 11/14/2016   Chronic hepatitis C without hepatic coma (HCC) 08/01/2016   Hypothyroidism 10/29/2015   Mild diastolic dysfunction 01/01/2015   Edema 01/01/2015   Edema of both ankles 12/24/2014   Stroke (HCC) 2016   TIA (transient ischemic attack) 07/30/2014   Weakness 07/29/2014   Flank pain 11/12/2013   Chronic respiratory failure (HCC) 10/25/2013   DOE (dyspnea on exertion) 10/25/2013   Depression with anxiety 10/15/2013   Insomnia 10/15/2013   Essential hypertension 10/09/2013   Hypokalemia 10/09/2013    Orientation RESPIRATION BLADDER Height & Weight     Self, Time, Situation, Place  O2 Continent, External catheter Weight: 117.9 kg Height:  5\' 3"  (160 cm)  BEHAVIORAL SYMPTOMS/MOOD NEUROLOGICAL BOWEL NUTRITION STATUS      Continent Diet (carb modified)  AMBULATORY STATUS COMMUNICATION OF NEEDS Skin   Extensive Assist Verbally Skin abrasions (bilateral knees with foam dressing)  Personal Care Assistance Level of Assistance  Bathing, Feeding, Dressing Bathing Assistance: Limited assistance Feeding assistance: Limited assistance Dressing Assistance: Limited assistance     Functional Limitations Info  Sight, Hearing, Speech  Sight Info: Adequate Hearing Info: Adequate Speech Info: Adequate    SPECIAL CARE FACTORS FREQUENCY  PT (By licensed PT), OT (By licensed OT)     PT Frequency: 5x/wk OT Frequency: 5x/wk            Contractures Contractures Info: Not present    Additional Factors Info  Code Status, Allergies, Psychotropic Code Status Info: Full Code Allergies Info: Hydrocodone , Norvasc  (Amlodipine  Besylate), Tizanidine  Psychotropic Info: Buspar  15mg  po BID, Seroquel  200mg  po daily         Current Medications (01/02/2024):  This is the current hospital active medication list Current Facility-Administered Medications  Medication Dose Route Frequency Provider Last Rate Last Admin   acetaminophen  (TYLENOL ) tablet 650 mg  650 mg Oral Q6H PRN Myrl Askew, MD   650 mg at 01/01/24 1655   Or   acetaminophen  (TYLENOL ) suppository 650 mg  650 mg Rectal Q6H PRN Myrl Askew, MD       acetaminophen  (TYLENOL ) tablet 1,000 mg  1,000 mg Oral Once Dorrell, Robert, MD       allopurinol  (ZYLOPRIM ) tablet 100 mg  100 mg Oral Daily PRN Myrl Askew, MD       alum & mag hydroxide-simeth (MAALOX/MYLANTA) 200-200-20 MG/5ML suspension 30 mL  30 mL Oral Q6H PRN Dahal, Aminta Baldy, MD   30 mL at 01/02/24 0135   enoxaparin  (LOVENOX ) injection 40 mg  40 mg Subcutaneous QHS Myrl Askew, MD   40 mg at 01/01/24 2148   ibuprofen  (ADVIL ) tablet 400 mg  400 mg Oral Q6H PRN Myrl Askew, MD   400 mg at 01/01/24 0516   insulin  aspart (novoLOG ) injection 0-15 Units  0-15 Units Subcutaneous TID WC Smiley Dung, RPH   3 Units at 01/02/24 0848   insulin  aspart (novoLOG ) injection 0-5 Units  0-5 Units Subcutaneous QHS Dahal, Aminta Baldy, MD       insulin  glargine-yfgn (SEMGLEE ) injection 45 Units  45 Units Subcutaneous QHS Myrl Askew, MD   45 Units at 01/01/24 2147   levothyroxine  (SYNTHROID ) tablet 100 mcg  100 mcg Oral QAC breakfast Myrl Askew, MD   100 mcg at 01/02/24 1610   methocarbamol  (ROBAXIN ) injection 500  mg  500 mg Intravenous Q8H PRN Myrl Askew, MD       metoprolol  succinate (TOPROL -XL) 24 hr tablet 100 mg  100 mg Oral Daily Dorrell, Robert, MD   100 mg at 01/02/24 0848   ondansetron  (ZOFRAN ) tablet 4 mg  4 mg Oral Q6H PRN Myrl Askew, MD       Or   ondansetron  (ZOFRAN ) injection 4 mg  4 mg Intravenous Q6H PRN Dorrell, Robert, MD       oxyCODONE  (Oxy IR/ROXICODONE ) immediate release tablet 5 mg  5 mg Oral Q4H PRN Dorrell, Robert, MD       pregabalin  (LYRICA ) capsule 100 mg  100 mg Oral QHS Myrl Askew, MD   100 mg at 01/01/24 2147   pregabalin  (LYRICA ) capsule 50 mg  50 mg Oral BID Dorrell, Robert, MD   50 mg at 01/02/24 0848   zolpidem  (AMBIEN ) tablet 5 mg  5 mg Oral QHS Dorrell, Robert, MD   5 mg at 01/01/24 2147     Discharge Medications: Please see discharge summary for a list of discharge medications.  Relevant Imaging Results:  Relevant  Lab Results:   Additional Information SSN: [id number]  Bari Leys, RN

## 2024-01-02 NOTE — Plan of Care (Signed)
  Problem: Activity: Goal: Risk for activity intolerance will decrease Outcome: Progressing   Problem: Coping: Goal: Level of anxiety will decrease Outcome: Progressing   Problem: Elimination: Goal: Will not experience complications related to bowel motility Outcome: Progressing

## 2024-01-02 NOTE — Evaluation (Signed)
 Occupational Therapy Evaluation Patient Details Name: Sue [name] MRN: [id number] DOB: 1950-10-03 Today's Date: 01/02/2024   History of Present Illness   73 y.o. female  Patient had rolled out of bed and fallen between her nightstand and was unable to get up. She reported being on the ground for about 24 hours. admitted with  Rhabdomyolysis. PMH: T2D, depression, hypothyroidism, prior stroke, morbid obesity, HTN, COPD     Clinical Impressions PTA, patient lives alone but has son and daughter as well as a PCA 2x per week to provide intermittent assistance. Patient baseline on 4 ltrs of O2 via [place]. Patient presents with deficits outlined below (see OT problem list) including now on 6 ltrs of O2, has pain, increased body habitus and significant generalized weakness, balance and activity tolerance deficits impacting BADL's and transfers/functional mobility. Patient requires continued Acute level skilled OT to progress function. Patient will benefit from continued inpatient follow up therapy, <3 hours/day      If plan is discharge home, recommend the following:   Two people to help with walking and/or transfers;A lot of help with bathing/dressing/bathroom;Assistance with cooking/housework;Direct supervision/assist for medications management;Direct supervision/assist for financial management;Assist for transportation;Help with stairs or ramp for entrance;Supervision due to cognitive status     Functional Status Assessment   Patient has had a recent decline in their functional status and demonstrates the ability to make significant improvements in function in a reasonable and predictable amount of time.     Equipment Recommendations   None recommended by OT (to be determined after next venue)      Precautions/Restrictions   Precautions Precautions: Fall Restrictions Weight Bearing Restrictions Per Provider Order: No     Mobility Bed Mobility Overal bed mobility: Needs  Assistance Bed Mobility: Supine to Sit, Rolling Rolling: Max assist, Used rails   Supine to sit: Mod assist, HOB elevated, Used rails     General bed mobility comments: increased time and assist for LB management    Transfers Overall transfer level: Needs assistance Equipment used: Rolling walker (2 wheels) Transfers: Sit to/from Stand, Bed to chair/wheelchair/BSC Sit to Stand: Mod assist, +2 physical assistance, +2 safety/equipment, From elevated surface     Step pivot transfers: Mod assist, +2 physical assistance, +2 safety/equipment     General transfer comment: extremly slow during transfers and reliant on RW heavily      Balance Overall balance assessment: Needs assistance Sitting-balance support: No upper extremity supported, Feet supported Sitting balance-Leahy Scale: Fair     Standing balance support: Bilateral upper extremity supported, During functional activity, Reliant on assistive device for balance Standing balance-Leahy Scale: Poor                             ADL either performed or assessed with clinical judgement   ADL Overall ADL's : Needs assistance/impaired Eating/Feeding: Set up;Bed level   Grooming: Wash/dry hands;Wash/dry face;Oral care;Contact guard assist;Sitting   Upper Body Bathing: Moderate assistance;Sitting   Lower Body Bathing: Total assistance;+2 for physical assistance;+2 for safety/equipment;Sit to/from stand;Sitting/lateral leans;Bed level   Upper Body Dressing : Moderate assistance;Sitting   Lower Body Dressing: Sitting/lateral leans;Sit to/from stand;Bed level;Total assistance;+2 for physical assistance;+2 for safety/equipment   Toilet Transfer: Moderate assistance;+2 for physical assistance;+2 for safety/equipment;Requires wide/bariatric;Rolling walker (2 wheels)   Toileting- Clothing Manipulation and Hygiene: +2 for physical assistance;Total assistance;+2 for safety/equipment;Sitting/lateral lean;Bed level        Functional mobility during ADLs: Moderate assistance;+2 for physical assistance;+2 for  safety/equipment;Rolling walker (2 wheels) General ADL Comments: poor ability to reach for LB     Vision Baseline Vision/History: 0 No visual deficits;1 Wears glasses Ability to See in Adequate Light: 0 Adequate Patient Visual Report: No change from baseline Vision Assessment?: No apparent visual deficits;Wears glasses for reading     Perception Perception: Within Functional Limits       Praxis Praxis: [facility]       Pertinent Vitals/Pain Pain Assessment Pain Assessment: Faces Faces Pain Scale: Hurts little more Pain Location: R hip and B feet Pain Descriptors / Indicators: Burning, Aching Pain Intervention(s): Monitored during session, Premedicated before session, Repositioned, Relaxation     Extremity/Trunk Assessment Upper Extremity Assessment Upper Extremity Assessment: Right hand dominant;Generalized weakness   Lower Extremity Assessment Lower Extremity Assessment: Generalized weakness;Defer to PT evaluation   Cervical / Trunk Assessment Cervical / Trunk Assessment:  (increased body habitus)   Communication Communication Communication: No apparent difficulties   Cognition Arousal: Alert Behavior During Therapy: WFL for tasks assessed/performed Cognition: No apparent impairments             OT - Cognition Comments: mildly slower processing                 Following commands: Impaired Following commands impaired: Follows one step commands with increased time     Cueing  General Comments   Cueing Techniques: Verbal cues;Tactile cues   (increased body habitus, O2 via  with 94% SpO2)           Home Living Family/patient expects to be discharged to:: Unsure Living Arrangements: Alone   Type of Home: Apartment Home Access: Level entry                     Home Equipment: Rollator (4 wheels);Grab bars - tub/shower;Hand held shower head;Shower  seat;Other (comment) (O2)   Additional Comments: O2 dependent at baseline      Prior Functioning/Environment Prior Level of Function : History of Falls (last six months);Independent/Modified Independent             Mobility Comments: reports 2 recent falls; ADLs Comments: independent with meals, dtr does groceries, hired PCA 2x per week for cleaning and laundry    OT Problem List: Decreased strength;Decreased range of motion;Decreased activity tolerance;Impaired balance (sitting and/or standing);Decreased cognition;Decreased safety awareness;Decreased knowledge of use of DME or AE;Decreased knowledge of precautions;Cardiopulmonary status limiting activity;Obesity;Impaired UE functional use;Pain;Increased edema   OT Treatment/Interventions: Self-care/ADL training;Therapeutic exercise;Neuromuscular education;Energy conservation;DME and/or AE instruction;Therapeutic activities;Cognitive remediation/compensation;Patient/family education;Balance training      OT Goals(Current goals can be found in the care plan section)   Acute Rehab OT Goals Patient Stated Goal: to get up better OT Goal Formulation: With patient Time For Goal Achievement: 01/16/24 Potential to Achieve Goals: Good ADL Goals Pt Will Perform Upper Body Bathing: with set-up;sitting Pt Will Perform Lower Body Bathing: with mod assist;sitting/lateral leans;sit to/from stand;with adaptive equipment Pt Will Perform Upper Body Dressing: with set-up;sitting Pt Will Perform Lower Body Dressing: with mod assist;with adaptive equipment;sitting/lateral leans;sit to/from stand Pt Will Transfer to Toilet: with mod assist;with +2 assist;bedside commode   OT Frequency:  Min 2X/week       AM-PAC OT "6 Clicks" Daily Activity     Outcome Measure Help from another person eating meals?: A Little Help from another person taking care of personal grooming?: A Little Help from another person toileting, which includes using toliet,  bedpan, or urinal?: Total Help from another person bathing (including washing, rinsing,  drying)?: A Lot Help from another person to put on and taking off regular upper body clothing?: A Lot Help from another person to put on and taking off regular lower body clothing?: Total 6 Click Score: 12   End of Session Equipment Utilized During Treatment: Gait belt;Oxygen  Nurse Communication: Mobility status  Activity Tolerance: Patient limited by fatigue;Patient limited by pain Patient left: in chair;with call bell/phone within reach;with chair alarm set  OT Visit Diagnosis: Unsteadiness on feet (R26.81);History of falling (Z91.81);Muscle weakness (generalized) (M62.81);Cognitive communication deficit (R41.841);Pain                Time: 1000-1042 OT Time Calculation (min): 42 min Charges:  OT General Charges $OT Visit: 1 Visit OT Evaluation $OT Eval Moderate Complexity: 1 Mod OT Treatments $Self Care/Home Management : 23-37 mins  Safire Gordin OT/L Acute Rehabilitation Department  [phone]  01/02/2024, 1:08 PM

## 2024-01-02 NOTE — Progress Notes (Signed)
 PROGRESS NOTE  Sue [name]  DOB: [birth year]  PCP: Estill Hemming, DO WUJ:[id number]  DOA: 12/31/2023  LOS: 2 days  Hospital Day: 3  Brief narrative: Sue [name] is a 73 y.o. female with PMH significant for morbid obesity, DM2, HTN, stroke/TIA, COPD on 3 L oxygen , GERD, hypothyroidism. 4/20, patient was brought to the ED for a fall. Patient had rolled out of bed and fallen between her nightstand and was unable to get up.  She was reportedly on the ground for more than 24 hours until her family found her.    On initial workup, she was noted to have hyperglycemia, hypokalemia, elevated creatinine, elevated lactic acid and elevated CK Skeletal survey was unremarkable for any fracture. Patient started on IV fluid Per patient's daughter, there was concern of excessive pain control usage resulting to intermittent episodes of confusion.   Subjective: Patient was seen and examined this morning.  Comfortable in bed.  Not in distress.  More awake today.  States he did not sleep well last night. Hemodynamically stable, on 6 L oxygen  this morning Labs from this morning with CK down to 626, potassium low at 3.3, creatinine improving to 1.62, glucose level better at 160s Seen by PT yesterday.  SNF recommended  Assessment and plan: Rhabdomyolysis Because of being on the floor for more than 24-hour duration CK level gradually downtrending on IV fluid.  Okay to stop IV fluid today Recent Labs  Lab 12/31/23 1706 01/01/24 0347 01/02/24 0642  CKTOTAL 1,427* 1,021* 606*    Hypokalemia Potassium level this morning low at 3.3.  Replacement given. Recent Labs  Lab 12/31/23 1706 01/01/24 0347 01/02/24 0642  K 2.9* 3.3* 3.3*  MG 1.9  --   --    Acute metabolic encephalopathy  chronic pain Insomnia Mental status better today. PTA meds-  Lyrica  3 times daily, Seroquel  nightly, Ambien  nightly  Ativan  scheduled and as needed, oxycodone  as needed, Currently continued on Ambien  and  Lyrica  nightly.  Continue to hold orders.   Type 2 diabetes mellitus A1c 10.3 on 11/07/2023 PTA meds-Trulicity , Jardiance , Lantus  74 units daily, sliding scale insulin  Currently on Semglee  45 units nightly and SSI/Accu-Cheks Recent Labs  Lab 01/01/24 1158 01/01/24 1640 01/01/24 2137 01/02/24 0745 01/02/24 1132  GLUCAP 161* 247* 197* 161* 147*   Hypertension Continue metoprolol  On hold torsemide   Prior CVA  HLD Continue aspirin , statin  COPD On 3 L oxygen  at home. Continue bronchodilators   Hypothyroidism Continue Synthroid    Mobility: Encourage ambulation.  PT eval obtained.  SNF recommended  Goals of care   Code Status: Full Code     DVT prophylaxis:  enoxaparin  (LOVENOX ) injection 40 mg Start: 12/31/23 2200 SCDs Start: 12/31/23 1955   Antimicrobials: None Fluid: Can stop IV fluid today Consultants: None Family Communication: Family not at bedside today  Status: Inpatient Level of care:  Med-Surg   Patient is from: Home Needs to continue in-hospital care: Needs IV hydration, lab monitoring Anticipated d/c to: Hopefully home with home health per family's wish    Diet:  Diet Order             Diet Carb Modified Fluid consistency: Thin; Room service appropriate? Yes  Diet effective now                   Scheduled Meds:  acetaminophen   1,000 mg Oral Once   enoxaparin  (LOVENOX ) injection  40 mg Subcutaneous QHS   insulin  aspart  0-15 Units Subcutaneous TID  WC   insulin  aspart  0-5 Units Subcutaneous QHS   insulin  glargine-yfgn  45 Units Subcutaneous QHS   levothyroxine   100 mcg Oral QAC breakfast   metoprolol  succinate  100 mg Oral Daily   pregabalin   100 mg Oral QHS   pregabalin   50 mg Oral BID   zolpidem   5 mg Oral QHS    PRN meds: acetaminophen  **OR** acetaminophen , allopurinol , alum & mag hydroxide-simeth, ibuprofen , methocarbamol  (ROBAXIN ) injection, ondansetron  **OR** ondansetron  (ZOFRAN ) IV, oxyCODONE    Infusions:      Antimicrobials: Anti-infectives (From admission, onward)    None       Objective: Vitals:   01/02/24 0516 01/02/24 1351  BP: 116/62 129/63  Pulse: 92 82  Resp: 17 15  Temp: 98.3 F (36.8 C) 97.6 F (36.4 C)  SpO2: 94% 98%    Intake/Output Summary (Last 24 hours) at 01/02/2024 1405 Last data filed at 01/02/2024 0900 Gross per 24 hour  Intake 2378.57 ml  Output 600 ml  Net 1778.57 ml   Filed Weights   12/31/23 1550  Weight: 117.9 kg   Weight change:  Body mass index is 46.06 kg/m.   Physical Exam: General exam: Pleasant, elderly African-American female Skin: No rashes, lesions or ulcers. HEENT: Atraumatic, normocephalic, no obvious bleeding Lungs: Clear to auscultation bilaterally,  CVS: S1, S2, no murmur,   GI/Abd: Soft, nontender, nondistended, bowel sound present,   CNS: Somnolent, opens eyes on command.  Not in distress Psychiatry: Mood appropriate,  Extremities: No pedal edema, no calf tenderness,   Data Review: I have personally reviewed the laboratory data and studies available.  F/u labs ordered Unresulted Labs (From admission, onward)    None       Total time spent in review of labs and imaging, patient evaluation, formulation of plan, documentation and communication with family: 45 minutes  Signed, Hoyt Macleod, MD [facility] 01/02/2024

## 2024-01-02 NOTE — Plan of Care (Signed)
  Problem: Clinical Measurements: Goal: Diagnostic test results will improve Outcome: Progressing   Problem: Nutrition: Goal: Adequate nutrition will be maintained Outcome: Progressing   Problem: Coping: Goal: Level of anxiety will decrease Outcome: Progressing

## 2024-01-02 NOTE — TOC Progression Note (Addendum)
 Transition of Care [facility]) - Progression Note    Patient Details  Name: Sue [name] MRN: [id number] Date of Birth: 02-26-1951  [facility] CM/SW Contact  Bari Leys, RN Phone Number: 01/02/2024, 2:11 PM  Clinical Narrative:   Met with patient at bedside to review dc planning, PT eval completed , recommendation for short term rehab/SNF, pt agreeable, no preference. FL2 updated, Level 2 PASRR pending, faxed out for bed offers. Pt reports on home 02 through [facility].     Expected Discharge Plan: Home w Home Health Services Barriers to Discharge: Continued Medical Work up  Expected Discharge Plan and Services       Living arrangements for the past 2 months: Apartment                                       Social Determinants of Health (SDOH) Interventions SDOH Screenings   Food Insecurity: No Food Insecurity (12/31/2023)  Housing: Low Risk  (01/01/2024)  Transportation Needs: No Transportation Needs (12/31/2023)  Utilities: Not At Risk (12/31/2023)  Alcohol Screen: Low Risk  (01/27/2023)  Depression (PHQ2-9): Medium Risk (07/04/2023)  Financial Resource Strain: Low Risk  (01/24/2022)  Physical Activity: Inactive (07/04/2023)  Social Connections: Unknown (01/01/2024)  Stress: Stress Concern Present (07/04/2023)  Tobacco Use: Medium Risk (12/31/2023)    Readmission Risk Interventions    01/01/2024    9:25 AM  Readmission Risk Prevention Plan  Transportation Screening Complete  PCP or Specialist Appt within 5-7 Days Complete  Home Care Screening Complete  Medication Review (RN CM) Complete

## 2024-01-03 DIAGNOSIS — M6282 Rhabdomyolysis: Secondary | ICD-10-CM | POA: Diagnosis not present

## 2024-01-03 LAB — BASIC METABOLIC PANEL WITH GFR
Anion gap: 9 (ref 5–15)
BUN: 15 mg/dL (ref 8–23)
CO2: 28 mmol/L (ref 22–32)
Calcium: 8.7 mg/dL — ABNORMAL LOW (ref 8.9–10.3)
Chloride: 100 mmol/L (ref 98–111)
Creatinine, Ser: 1.41 mg/dL — ABNORMAL HIGH (ref 0.44–1.00)
GFR, Estimated: 40 mL/min — ABNORMAL LOW (ref >60)
Glucose, Bld: 146 mg/dL — ABNORMAL HIGH (ref 70–99)
Potassium: 3 mmol/L — ABNORMAL LOW (ref 3.5–5.1)
Sodium: 137 mmol/L (ref 135–145)

## 2024-01-03 LAB — GLUCOSE, CAPILLARY
Glucose-Capillary: 121 mg/dL — ABNORMAL HIGH (ref 70–99)
Glucose-Capillary: 105 mg/dL — ABNORMAL HIGH (ref 70–99)
Glucose-Capillary: 168 mg/dL — ABNORMAL HIGH (ref 70–99)
Glucose-Capillary: 155 mg/dL — ABNORMAL HIGH (ref 70–99)

## 2024-01-03 MED ORDER — HYDROXYZINE HCL 25 MG PO TABS
25.0000 mg | ORAL_TABLET | Freq: Once | ORAL | Status: AC
  Administered 2024-01-03: 25 mg via ORAL
  Filled 2024-01-03: qty 1

## 2024-01-03 MED ORDER — POTASSIUM CHLORIDE CRYS ER 20 MEQ PO TBCR
40.0000 meq | EXTENDED_RELEASE_TABLET | Freq: Once | ORAL | Status: AC
  Administered 2024-01-03: 40 meq via ORAL
  Filled 2024-01-03: qty 2

## 2024-01-03 NOTE — Progress Notes (Signed)
 PROGRESS NOTE  Sue [name]  DOB: [birth year]  PCP: Estill Hemming, DO ZOX:[id number]  DOA: 12/31/2023  LOS: 3 days  Hospital Day: 4  Brief narrative: Sue [name] is a 73 y.o. female with PMH significant for morbid obesity, DM2, HTN, stroke/TIA, COPD on 3 L oxygen , GERD, hypothyroidism. 4/20, patient was brought to the ED for a fall. Patient had rolled out of bed and fallen between her nightstand and was unable to get up.  She was reportedly on the ground for more than 24 hours until her family found her.    On initial workup, she was noted to have hyperglycemia, hypokalemia, elevated creatinine, elevated lactic acid and elevated CK Skeletal survey was unremarkable for any fracture. Patient started on IV fluid Per patient's daughter, there was concern of excessive pain control usage resulting to intermittent episodes of confusion.  See below for details  Subjective: Patient was seen and examined this morning.  Lying on bed.  Not in distress.  No new complaint.  On 3 L oxygen  by nasal [place] morning.  Family not at bedside.  SNF recommended by PT.   Assessment and plan: Rhabdomyolysis Because of being on the floor for more than 24-hour duration CK level gradually improved with IV fluid.  Currently not on IV hydration.  Oral hydration encouraged. Recent Labs  Lab 12/31/23 1706 01/01/24 0347 01/02/24 0642  CKTOTAL 1,427* 1,021* 606*    Hypokalemia Potassium level low at 3 this morning.  Oral replacement given. Recent Labs  Lab 12/31/23 1706 01/01/24 0347 01/02/24 0642 01/03/24 0338  K 2.9* 3.3* 3.3* 3.0*  MG 1.9  --   --   --    Acute metabolic encephalopathy  chronic pain Insomnia Mental status better today. PTA meds-  Lyrica  3 times daily, Seroquel  nightly, Ambien  nightly  Ativan  scheduled and as needed, oxycodone  as needed, Currently continued on Ambien  and Lyrica  nightly.  Continue to hold orders.   Type 2 diabetes mellitus A1c 10.3 on  11/07/2023 PTA meds-Trulicity , Jardiance , Lantus  74 units daily, sliding scale insulin  Currently blood sugar level is adequately controlled on Semglee  45 units nightly and SSI/Accu-Cheks Recent Labs  Lab 01/02/24 0745 01/02/24 1132 01/02/24 1712 01/02/24 2208 01/03/24 0735  GLUCAP 161* 147* 177* 175* 121*   Hypertension Continue metoprolol  On hold torsemide   Prior CVA  HLD Continue aspirin , statin  COPD On 3 L oxygen  at home. Continue bronchodilators   Hypothyroidism Continue Synthroid    Mobility: Encourage ambulation.  PT eval obtained.  SNF recommended  Goals of care   Code Status: Full Code     DVT prophylaxis:  enoxaparin  (LOVENOX ) injection 40 mg Start: 12/31/23 2200 SCDs Start: 12/31/23 1955   Antimicrobials: None Fluid: Not on IV fluid Consultants: None Family Communication: Family not at bedside today  Status: Inpatient Level of care:  Med-Surg   Patient is from: Home Needs to continue in-hospital care: Medically stable for discharge. Anticipated d/c to: PT recommended SNF.  Case management following    Diet:  Diet Order             Diet Carb Modified Fluid consistency: Thin; Room service appropriate? Yes  Diet effective now                   Scheduled Meds:  acetaminophen   1,000 mg Oral Once   enoxaparin  (LOVENOX ) injection  40 mg Subcutaneous QHS   insulin  aspart  0-15 Units Subcutaneous TID WC   insulin  aspart  0-5 Units Subcutaneous  QHS   insulin  glargine-yfgn  45 Units Subcutaneous QHS   levothyroxine   100 mcg Oral QAC breakfast   metoprolol  succinate  100 mg Oral Daily   pregabalin   100 mg Oral QHS   pregabalin   50 mg Oral BID   zolpidem   5 mg Oral QHS    PRN meds: acetaminophen  **OR** acetaminophen , allopurinol , alum & mag hydroxide-simeth, ibuprofen , methocarbamol  (ROBAXIN ) injection, ondansetron  **OR** ondansetron  (ZOFRAN ) IV, oxyCODONE    Infusions:     Antimicrobials: Anti-infectives (From admission, onward)     None       Objective: Vitals:   01/02/24 2206 01/03/24 0843  BP: (!) 144/66   Pulse: 76   Resp: 19   Temp: 97.6 F (36.4 C)   SpO2: 100% 98%    Intake/Output Summary (Last 24 hours) at 01/03/2024 1112 Last data filed at 01/03/2024 0900 Gross per 24 hour  Intake 120 ml  Output --  Net 120 ml   Filed Weights   12/31/23 1550  Weight: 117.9 kg   Weight change:  Body mass index is 46.06 kg/m.   Physical Exam: General exam: Pleasant, elderly African-American female Skin: No rashes, lesions or ulcers. HEENT: Atraumatic, normocephalic, no obvious bleeding Lungs: Clear to auscultation bilaterally,  CVS: S1, S2, no murmur,   GI/Abd: Soft, nontender, nondistended, bowel sound present,   CNS: Alert, awake, speech clear.  Slow to respond but oriented x 3  psychiatry: Mood appropriate,  Extremities: No pedal edema, no calf tenderness,   Data Review: I have personally reviewed the laboratory data and studies available.  F/u labs ordered Unresulted Labs (From admission, onward)    None       Total time spent in review of labs and imaging, patient evaluation, formulation of plan, documentation and communication with family: 45 minutes  Signed, Hoyt Macleod, MD [facility] 01/03/2024

## 2024-01-03 NOTE — Discharge Summary (Incomplete)
 Physician Discharge Summary  Sue [name] WUJ:[id number] DOB: [birth year] DOA: 12/31/2023  PCP: Estill Hemming, DO  Admit date: 12/31/2023 Discharge date: 01/03/2024  Admitted From: Home Discharge disposition: Home with home health  Recommendations at discharge:  ***    Brief narrative: Sue [name] is a 73 y.o. female with PMH significant for morbid obesity, DM2, HTN, stroke/TIA, COPD on 3 L oxygen , GERD, hypothyroidism. 4/20, patient was brought to the ED for a fall. Patient had rolled out of bed and fallen between her nightstand and was unable to get up.  She was reportedly on the ground for more than 24 hours until her family found her.    On initial workup, she was noted to have hyperglycemia, hypokalemia, elevated creatinine, elevated lactic acid and elevated CK Skeletal survey was unremarkable for any fracture. Patient started on IV fluid Per patient's daughter, there was concern of excessive pain control usage resulting to intermittent episodes of confusion.  See below for details  Subjective: Patient was seen and examined this morning.  Lying on bed.  Not in distress.  No new complaint.  On 3 L oxygen  by nasal Callas morning.  Family not at bedside.  SNF recommended by PT but family wants to take her home.  Assessment and plan: Rhabdomyolysis Because of being on the floor for more than 24-hour duration CK level gradually improved with IV fluid.  Currently not on IV hydration.  Oral hydration encouraged. Recent Labs  Lab 12/31/23 1706 01/01/24 0347 01/02/24 0642  CKTOTAL 1,427* 1,021* 606*    Hypokalemia Potassium level low at 3 this morning.  Oral replacement given. Recent Labs  Lab 12/31/23 1706 01/01/24 0347 01/02/24 0642 01/03/24 0338  K 2.9* 3.3* 3.3* 3.0*  MG 1.9  --   --   --    Acute metabolic encephalopathy  chronic pain Insomnia Mental status better today. PTA meds-  Lyrica  3 times daily, Seroquel  nightly, Ambien  nightly  Ativan   scheduled and as needed, oxycodone  as needed, Currently continued on Ambien  and Lyrica  nightly.  Continue to hold orders.   Type 2 diabetes mellitus A1c 10.3 on 11/07/2023 PTA meds-Trulicity , Jardiance , Lantus  74 units daily, sliding scale insulin  Can continue the same regimen at home but would recommend to improve compliance.  Hypertension Continue metoprolol  On hold torsemide   Prior CVA  HLD Continue aspirin , statin  COPD On 3 L oxygen  at home. Continue bronchodilators   Hypothyroidism Continue Synthroid    Mobility: Encourage ambulation.  PT eval obtained.  SNF recommended.  However family wanted home health PT, OT, RN, aide.  Goals of care   Code Status: Full Code   Diet:  Diet Order             Diet Carb Modified Fluid consistency: Thin; Room service appropriate? Yes  Diet effective now                   Nutritional status:  Body mass index is 46.06 kg/m.       Wounds:  - Wound / Incision (Open or Dehisced) 11/23/19 Buttocks Right partial thickness to right buttock (Active)  Date First Assessed/Time First Assessed: 11/23/19 0824   Location: Buttocks  Location Orientation: Right  Wound Description (Comments): partial thickness to right buttock    Assessments 11/23/2019  8:14 AM 12/02/2019  2:33 PM  Dressing Type -- Foam - Lift dressing to assess site every shift  Dressing Status -- Clean;Dry;Intact  Site / Wound Assessment Pink --  % Wound  base Red or Granulating 100% --  % Wound base Yellow/Fibrinous Exudate 0% --  % Wound base Black/Eschar 0% --  % Wound base Other/Granulation Tissue (Comment) 0% --  Peri-wound Assessment Intact --  Wound Length (cm) 0 cm --  Wound Width (cm) 0 cm --  Wound Depth (cm) 0 cm --  Wound Volume (cm^3) 0 cm^3 --  Wound Surface Area (cm^2) 0 cm^2 --  Tunneling (cm) 0 --  Undermining (cm) 0 --  Drainage Amount None None     No associated orders.    Discharge Exam:   Vitals:   01/02/24 1351 01/02/24 2206 01/03/24  0843 01/03/24 1328  BP: 129/63 (!) 144/66  129/83  Pulse: 82 76  70  Resp: 15 19  18   Temp: 97.6 F (36.4 C) 97.6 F (36.4 C)  97.8 F (36.6 C)  TempSrc: Oral     SpO2: 98% 100% 98% 99%  Weight:      Height:        Body mass index is 46.06 kg/m.  General exam: Pleasant, elderly African-American female Skin: No rashes, lesions or ulcers. HEENT: Atraumatic, normocephalic, no obvious bleeding Lungs: Clear to auscultation bilaterally,  CVS: S1, S2, no murmur,   GI/Abd: Soft, nontender, nondistended, bowel sound present.  CNS: Alert, awake, speech clear.  Slow to respond but oriented x 3  psychiatry: Mood appropriate,  Extremities: No pedal edema, no calf tenderness,   Follow ups:    Discharge Instructions:    Discharge Medications:   Allergies as of 01/03/2024       Reactions   Hydrocodone  Other (See Comments)   Constipation and hallucinations   Norvasc  [amlodipine  Besylate] Swelling, Other (See Comments)   Marked swelling of the limbs   Tizanidine  Other (See Comments)   After the 3rd dose, the patient's mouth began to feel numb and she felt like she was a having a "hot flash"     Med Rec must be completed prior to using this [facility]***        The results of significant diagnostics from this hospitalization (including imaging, microbiology, ancillary and laboratory) are listed below for reference.    Procedures and Diagnostic Studies:   DG Hip Unilat With Pelvis 2-3 Views Right Result Date: 12/31/2023 CLINICAL DATA:  Fall. Found down at home by family. On floor for 2 days. EXAM: DG HIP (WITH OR WITHOUT PELVIS) 2-3V RIGHT COMPARISON:  Pelvis and right hip radiographs 12/29/2023 FINDINGS: Moderate bilateral femoroacetabular joint space narrowing. No acute fracture. Mild bilateral sacroiliac subchondral sclerosis. The pubic symphysis joint space is maintained. 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 No acute fracture. Electronically  Signed   By: Bertina Broccoli M.D.   On: 12/31/2023 18:19   DG Foot Complete Left Result Date: 12/31/2023 CLINICAL DATA:  Fall. Found at home by family in prone position. Down for 2 days. EXAM: LEFT FOOT - COMPLETE 3+ VIEW COMPARISON:  Left tibia and fibula radiographs 03/03/2023 FINDINGS: Mild hallux valgus. Old healed fracture of the distal shaft of the fourth metatarsal with mild medial displacement of the distal fracture component with respect to the proximal fracture component. Old healed fracture of the fifth metatarsal with approximately 4 mm dorsal displacement and minimal medial displacement of the distal fracture component with respect to the proximal fracture component. Tiny posterior calcaneal heel spur. No acute fracture or dislocation. IMPRESSION: 1. No acute fracture. 2. Old healed fractures of the fourth and fifth metatarsals. Electronically Signed   By: Bertina Broccoli  M.D.   On: 12/31/2023 18:18   DG Chest Port 1 View Result Date: 12/31/2023 CLINICAL DATA:  Fall. Found at home by family in prone position. Patient reports she has been on the floor for 2 days. EXAM: PORTABLE CHEST 1 VIEW COMPARISON:  AP chest 12/23/2022, 10/28/2022 FINDINGS: The heart size and mediastinal contours are within normal limits. Both lungs are clear. The visualized skeletal structures are unremarkable. IMPRESSION: No active disease. Electronically Signed   By: Bertina Broccoli M.D.   On: 12/31/2023 18:06     Labs:   Basic Metabolic Panel: Recent Labs  Lab 12/31/23 1706 01/01/24 0347 01/02/24 0642 01/03/24 0338  NA 139 136 133* 137  K 2.9* 3.3* 3.3* 3.0*  CL 98 100 95* 100  CO2 29 27 30 28   GLUCOSE 359* 269* 171* 146*  BUN 17 17 17 15   CREATININE 2.06* 1.78* 1.62* 1.41*  CALCIUM  9.3 8.6* 8.8* 8.7*  MG 1.9  --   --   --    GFR Estimated Creatinine Clearance: 45.4 mL/min (A) (by C-G formula based on SCr of 1.41 mg/dL (H)). Liver Function Tests: Recent Labs  Lab 12/31/23 1706  AST 40  ALT 18   ALKPHOS 107  BILITOT 1.0  PROT 8.6*  ALBUMIN  2.8*   No results for input(s): "LIPASE", "AMYLASE" in the last 168 hours. No results for input(s): "AMMONIA" in the last 168 hours. Coagulation profile No results for input(s): "INR", "PROTIME" in the last 168 hours.  CBC: Recent Labs  Lab 12/31/23 1706 01/01/24 0347 01/02/24 0642  WBC 10.8* 10.2 7.1  NEUTROABS 8.1*  --  4.5  HGB 13.0 11.5* 11.8*  HCT 41.2 37.1 38.5  MCV 96.3 97.6 98.7  PLT 184 156 175   Cardiac Enzymes: Recent Labs  Lab 12/31/23 1706 01/01/24 0347 01/02/24 0642  CKTOTAL 1,427* 1,021* 606*   BNP: Invalid input(s): "POCBNP" CBG: Recent Labs  Lab 01/02/24 1132 01/02/24 1712 01/02/24 2208 01/03/24 0735 01/03/24 1146  GLUCAP 147* 177* 175* 121* 105*   D-Dimer No results for input(s): "DDIMER" in the last 72 hours. Hgb A1c No results for input(s): "HGBA1C" in the last 72 hours. Lipid Profile No results for input(s): "CHOL", "HDL", "LDLCALC", "TRIG", "CHOLHDL", "LDLDIRECT" in the last 72 hours. Thyroid  function studies No results for input(s): "TSH", "T4TOTAL", "T3FREE", "THYROIDAB" in the last 72 hours.  Invalid input(s): "FREET3" Anemia work up No results for input(s): "VITAMINB12", "FOLATE", "FERRITIN", "TIBC", "IRON", "RETICCTPCT" in the last 72 hours. Microbiology No results found for this or any previous visit (from the past 240 hours).  Time coordinating discharge: 45 minutes  Signed: Adrielle Polakowski  [facility] 01/03/2024, 1:46 PM

## 2024-01-03 NOTE — Plan of Care (Signed)

## 2024-01-03 NOTE — Progress Notes (Signed)
 Physical Therapy Treatment Patient Details Name: Sue [name] MRN: [id number] DOB: [birth year] Today's Date: 01/03/2024   History of Present Illness 73 y.o. female  Patient had rolled out of bed and fallen between her nightstand and was unable to get up. She reported being on the ground for about 24 hours. admitted with  Rhabdomyolysis. PMH: T2D, depression, hypothyroidism, prior stroke, morbid obesity, HTN, COPD    PT Comments  AxO x 3 slighly slow to respond.  Lives home alone when she fell/rolled OOB reaching for "something" and stayed there "for three days" stated Pt.  Pt amb with a Rollator and has been on oxygen  "for years".  Her Sister assists with groceries/MD appts. Assisted OOB was difficult.  General bed mobility comments: increased time, use of rail and assist for LB management.  General transfer comment: Required + 2 assist with increased time and VC's on proper hand placement and safety with turns.  Pt had great difficulty rising and weighshifting to pivot/advance either LE.  Max c/o R hip/back pain. General Gait Details: unable to functionally weightshift enough to advance either LE due to effort/weakness and increased R hip pain.  Recliner pulled to Pt from behind. Pt lives home alone.  LPT rec Pt will need ST Rehab at SNF to address mobility and functional decline prior to safely returning home.    If plan is discharge home, recommend the following:     Can travel by private vehicle     No  Equipment Recommendations  None recommended by PT    Recommendations for Other Services       Precautions / Restrictions Precautions Precautions: Fall Precaution/Restrictions Comments: Oxygen  dep 3 lts Restrictions Weight Bearing Restrictions Per Provider Order: No     Mobility  Bed Mobility Overal bed mobility: Needs Assistance Bed Mobility: Supine to Sit     Supine to sit: Mod assist, HOB elevated, Used rails     General bed mobility comments: increased time, use of  rail and assist for LB management    Transfers Overall transfer level: Needs assistance Equipment used: Rolling walker (2 wheels) Transfers: Sit to/from Stand Sit to Stand: Mod assist, +2 physical assistance, +2 safety/equipment, From elevated surface           General transfer comment: Required + 2 assist with increased time and VC's on proper hand placement and safety with turns.  Pt had great difficulty rising and weighshifting to pivot/advance either LE.  Max c/o R hip/back pain.    Ambulation/Gait               General Gait Details: unable to functionally weightshift enough to advance either LE due to effort/weakness and increased R hip pain.  Recliner pulled to Pt from behind.   Stairs             Wheelchair Mobility     Tilt Bed    Modified Rankin (Stroke Patients Only)       Balance                                            Communication    Cognition Arousal: Alert Behavior During Therapy: WFL for tasks assessed/performed   PT - Cognitive impairments: No apparent impairments                       PT - Cognition Comments:  AxO x 3 slighly slow to respond.  Lives home alone when she fell/rolled OOB reaching for "something" and stayed there "for three days" stated Pt.  Pt amb with a Rollator and has been on oxygen  "for years".  Her Sister assists with groceries/MD appts. Following commands: Intact      Cueing    Exercises      General Comments        Pertinent Vitals/Pain Pain Assessment Pain Assessment: Faces Faces Pain Scale: Hurts little more Pain Location: R hip and B feet Pain Descriptors / Indicators: Burning, Aching, Tender Pain Intervention(s): Monitored during session, Repositioned    Home Living                          Prior Function            PT Goals (current goals can now be found in the care plan section) Progress towards PT goals: Progressing toward goals     Frequency    Min 3X/week      PT Plan      Co-evaluation              AM-PAC PT "6 Clicks" Mobility   Outcome Measure  Help needed turning from your back to your side while in a flat bed without using bedrails?: A Lot Help needed moving from lying on your back to sitting on the side of a flat bed without using bedrails?: A Lot Help needed moving to and from a bed to a chair (including a wheelchair)?: A Lot Help needed standing up from a chair using your arms (e.g., wheelchair or bedside chair)?: A Lot Help needed to walk in hospital room?: A Lot Help needed climbing 3-5 steps with a railing? : Total 6 Click Score: 11    End of Session Equipment Utilized During Treatment: Gait belt Activity Tolerance: Patient limited by fatigue;Patient limited by pain Patient left: in chair;with call bell/phone within reach;with family/visitor present;with chair alarm set Nurse Communication: Mobility status PT Visit Diagnosis: Other abnormalities of gait and mobility (R26.89)     Time: 5784-6962 PT Time Calculation (min) (ACUTE ONLY): 25 min  Charges:    $Therapeutic Activity: 23-37 mins PT General Charges $$ ACUTE PT VISIT: 1 Visit                    Bess Broody  PTA Acute  Rehabilitation Services Office M-F          [phone]

## 2024-01-03 NOTE — TOC Progression Note (Addendum)
 Transition of Care [facility]) - Progression Note    Patient Details  Name: Sue [name] MRN: [id number] Date of Birth: 02-07-1951  Transition of [facility]) CM/SW Contact  Bari Leys, RN Phone Number: 01/03/2024, 12:38 PM  Clinical Narrative:   Met with patient and her son, [name], at bedside to present short term rehab/SNF bed offers [facility], [place], [facility], [place], [place], [place], [place], [place], [place]), pt/son declined SNF at this time, prefers [facility], attending notified.   -1:27pm Level 2 PASRR [id number]  -2:38pm HH orders placed for [facility] PT/OT/RN/HHA. Gasper Karst, rep-Cory, accepted for all services, added to AVS.     Expected Discharge Plan: Home w Home Health Services Barriers to Discharge: Continued Medical Work up  Expected Discharge Plan and Services       Living arrangements for the past 2 months: Apartment                                       Social Determinants of Health (SDOH) Interventions SDOH Screenings   Food Insecurity: No Food Insecurity (12/31/2023)  Housing: Low Risk  (01/01/2024)  Transportation Needs: No Transportation Needs (12/31/2023)  Utilities: Not At Risk (12/31/2023)  Alcohol Screen: Low Risk  (01/27/2023)  Depression (PHQ2-9): Medium Risk (07/04/2023)  Financial Resource Strain: Low Risk  (01/24/2022)  Physical Activity: Inactive (07/04/2023)  Social Connections: Unknown (01/02/2024)  Stress: Stress Concern Present (07/04/2023)  Tobacco Use: Medium Risk (12/31/2023)    Readmission Risk Interventions    01/01/2024    9:25 AM  Readmission Risk Prevention Plan  Transportation Screening Complete  PCP or Specialist Appt within 5-7 Days Complete  Home Care Screening Complete  Medication Review (RN CM) Complete

## 2024-01-04 ENCOUNTER — Inpatient Hospital Stay (HOSPITAL_COMMUNITY)

## 2024-01-04 DIAGNOSIS — N1832 Chronic kidney disease, stage 3b: Secondary | ICD-10-CM

## 2024-01-04 DIAGNOSIS — M6282 Rhabdomyolysis: Secondary | ICD-10-CM | POA: Diagnosis not present

## 2024-01-04 DIAGNOSIS — R9431 Abnormal electrocardiogram [ECG] [EKG]: Secondary | ICD-10-CM | POA: Diagnosis not present

## 2024-01-04 DIAGNOSIS — R002 Palpitations: Secondary | ICD-10-CM

## 2024-01-04 DIAGNOSIS — N179 Acute kidney failure, unspecified: Secondary | ICD-10-CM

## 2024-01-04 DIAGNOSIS — R7989 Other specified abnormal findings of blood chemistry: Secondary | ICD-10-CM | POA: Diagnosis not present

## 2024-01-04 DIAGNOSIS — I214 Non-ST elevation (NSTEMI) myocardial infarction: Secondary | ICD-10-CM | POA: Diagnosis not present

## 2024-01-04 DIAGNOSIS — E785 Hyperlipidemia, unspecified: Secondary | ICD-10-CM

## 2024-01-04 DIAGNOSIS — I5032 Chronic diastolic (congestive) heart failure: Secondary | ICD-10-CM | POA: Diagnosis not present

## 2024-01-04 LAB — BASIC METABOLIC PANEL WITH GFR
Anion gap: 9 (ref 5–15)
BUN: 15 mg/dL (ref 8–23)
CO2: 30 mmol/L (ref 22–32)
Calcium: 9.2 mg/dL (ref 8.9–10.3)
Chloride: 100 mmol/L (ref 98–111)
Creatinine, Ser: 1.53 mg/dL — ABNORMAL HIGH (ref 0.44–1.00)
GFR, Estimated: 36 mL/min — ABNORMAL LOW (ref >60)
Glucose, Bld: 130 mg/dL — ABNORMAL HIGH (ref 70–99)
Potassium: 3.8 mmol/L (ref 3.5–5.1)
Sodium: 139 mmol/L (ref 135–145)

## 2024-01-04 LAB — ECHOCARDIOGRAM COMPLETE
AR max vel: 2.19 cm2
AV Peak grad: 7.2 mmHg
Ao pk vel: 1.34 m/s
Area-P 1/2: 4.17 cm2
Height: 63 in
MV VTI: 2.53 cm2
S' Lateral: 3.8 cm
Weight: 4160 [oz_av]

## 2024-01-04 LAB — TROPONIN I (HIGH SENSITIVITY)
Troponin I (High Sensitivity): 236 ng/L (ref <18)
Troponin I (High Sensitivity): 235 ng/L (ref <18)

## 2024-01-04 LAB — CK: Total CK: 325 U/L — ABNORMAL HIGH (ref 38–234)

## 2024-01-04 LAB — CBC
HCT: 39.1 % (ref 36.0–46.0)
Hemoglobin: 12.2 g/dL (ref 12.0–15.0)
MCH: 30.3 pg (ref 26.0–34.0)
MCHC: 31.2 g/dL (ref 30.0–36.0)
MCV: 97.3 fL (ref 80.0–100.0)
Platelets: 193 10*3/uL (ref 150–400)
RBC: 4.02 MIL/uL (ref 3.87–5.11)
RDW: 14.4 % (ref 11.5–15.5)
WBC: 13 10*3/uL — ABNORMAL HIGH (ref 4.0–10.5)
nRBC: 0 % (ref 0.0–0.2)

## 2024-01-04 LAB — GLUCOSE, CAPILLARY
Glucose-Capillary: 148 mg/dL — ABNORMAL HIGH (ref 70–99)
Glucose-Capillary: 113 mg/dL — ABNORMAL HIGH (ref 70–99)
Glucose-Capillary: 99 mg/dL (ref 70–99)
Glucose-Capillary: 173 mg/dL — ABNORMAL HIGH (ref 70–99)

## 2024-01-04 MED ORDER — ASPIRIN 81 MG PO TBEC
81.0000 mg | DELAYED_RELEASE_TABLET | Freq: Every day | ORAL | Status: DC
  Administered 2024-01-05 – 2024-01-08 (×4): 81 mg via ORAL
  Filled 2024-01-04 (×4): qty 1

## 2024-01-04 MED ORDER — ASPIRIN 325 MG PO TABS
325.0000 mg | ORAL_TABLET | Freq: Once | ORAL | Status: AC
  Administered 2024-01-04: 325 mg via ORAL
  Filled 2024-01-04: qty 1

## 2024-01-04 MED ORDER — CALCIUM CARBONATE ANTACID 500 MG PO CHEW
400.0000 mg | CHEWABLE_TABLET | Freq: Once | ORAL | Status: AC
  Administered 2024-01-04: 400 mg via ORAL
  Filled 2024-01-04: qty 2

## 2024-01-04 MED ORDER — PERFLUTREN LIPID MICROSPHERE
1.0000 mL | INTRAVENOUS | Status: AC | PRN
  Administered 2024-01-04: 2 mL via INTRAVENOUS

## 2024-01-04 MED ORDER — HEPARIN (PORCINE) 25000 UT/250ML-% IV SOLN
1450.0000 [IU]/h | INTRAVENOUS | Status: DC
  Administered 2024-01-04: 1000 [IU]/h via INTRAVENOUS
  Administered 2024-01-05 – 2024-01-06 (×2): 1450 [IU]/h via INTRAVENOUS
  Filled 2024-01-04 (×3): qty 250

## 2024-01-04 MED ORDER — HEPARIN BOLUS VIA INFUSION
4000.0000 [IU] | Freq: Once | INTRAVENOUS | Status: AC
  Administered 2024-01-04: 4000 [IU] via INTRAVENOUS
  Filled 2024-01-04: qty 4000

## 2024-01-04 NOTE — Progress Notes (Signed)
 CRITICAL VALUE STICKER  CRITICAL VALUE: 236 troponin  RECEIVER (on-site recipient of call): Bettylou Brunner.  DATE & TIME NOTIFIED:  1052 01/04/2024 MESSENGER (representative from lab): unknwn MD NOTIFIED:  Dahal  TIME OF NOTIFICATION: 1053 RESPONSE:  Waiting

## 2024-01-04 NOTE — Progress Notes (Signed)
 Patient c/o chest pain "5/10" and inability to get warm. Patient assessed and pointing to epigastric area. Patient offered replacement dose of Mylanta and refused medication. Vital signs obtained (WNL) and EKG performed (NSR). On-Call NP made aware of the patient status and order given to administer Mylanta. NP made aware of the patient refusing Mylanta and order given for Tums, which the patient tolerated well.

## 2024-01-04 NOTE — Progress Notes (Signed)
 PT Cancellation Note  Patient Details Name: Sue [name] MRN: [id number] DOB: 17-Dec-1950   Cancelled Treatment:    Reason Eval/Treat Not Completed: Medical issues which prohibited therapy Chest pain with elevated troponin today.  Cardiology consulted.  Will check back as schedule permits.   Myna Asal Payson 01/04/2024, 3:35 PM Blanch Bunde, DPT Physical Therapist Acute Rehabilitation Services Office: [phone]

## 2024-01-04 NOTE — Progress Notes (Signed)
 PHARMACY - ANTICOAGULATION CONSULT NOTE  Pharmacy Consult for Heparin  Indication: chest pain/ACS  Allergies  Allergen Reactions   Hydrocodone  Other (See Comments)    Constipation and hallucinations   Norvasc  [Amlodipine  Besylate] Swelling and Other (See Comments)    Marked swelling of the limbs   Tizanidine  Other (See Comments)    After the 3rd dose, the patient's mouth began to feel numb and she felt like she was a having a "hot flash"    Patient Measurements: Height: 5\' 3"  (160 cm) Weight: 117.9 kg (260 lb) IBW/kg (Calculated) : 52.4 HEPARIN  DW (KG): 81.2  Vital Signs: Temp: 98.8 F (37.1 C) (04/24 0529) Temp Source: Oral (04/24 0529) BP: 109/84 (04/24 0841) Pulse Rate: 86 (04/24 0841)  Labs: Recent Labs    01/02/24 0642 01/03/24 0338 01/04/24 0927 01/04/24 1103  HGB 11.8*  --  12.2  --   HCT 38.5  --  39.1  --   PLT 175  --  193  --   CREATININE 1.62* 1.41* 1.53*  --   CKTOTAL 606*  --   --   --   TROPONINIHS  --   --  236* 235*    Estimated Creatinine Clearance: 41.8 mL/min (A) (by C-G formula based on SCr of 1.53 mg/dL (H)).   Medical History: Past Medical History:  Diagnosis Date   Allergic rhinitis    Depression    Diabetes mellitus type 2, uncontrolled    Emphysema of lung (HCC)    3L home O2   GERD (gastroesophageal reflux disease)    Hypertension    Hypothyroidism    Obesity, morbid, BMI 50 or higher (HCC)    Stroke (HCC) 2016   TIA    Urine incontinence     Medications:  Scheduled:   acetaminophen   1,000 mg Oral Once   [START ON 01/05/2024] aspirin  EC  81 mg Oral Daily   aspirin   325 mg Oral Once   enoxaparin  (LOVENOX ) injection  40 mg Subcutaneous QHS   insulin  aspart  0-15 Units Subcutaneous TID WC   insulin  aspart  0-5 Units Subcutaneous QHS   insulin  glargine-yfgn  45 Units Subcutaneous QHS   levothyroxine   100 mcg Oral QAC breakfast   metoprolol  succinate  100 mg Oral Daily   pregabalin   100 mg Oral QHS   pregabalin   50 mg Oral  BID   zolpidem   5 mg Oral QHS   Infusions:   Assessment: 37 yoF admitted on 4/20 with rhabdomyolysis after falling and was on the ground for more than 24 hours.  On 4/23 PM, she complained of chest pain, on 4/24 Pharmacy is consulted to dose Heparin  IV for ACS.  No PTA anticoagulation.  She has been on DVT prophylaxis Lovenox  40mg  - last given on 4/23 at 22:00 SCr 1.53 Trop: 236, 235 CBC: Hgb and Plt WNL   Goal of Therapy:  Heparin  level 0.3-0.7 units/ml Monitor platelets by anticoagulation protocol: Yes   Plan:  Give heparin  4000 units bolus IV x 1 Start heparin  IV infusion at 1000 units/hr Heparin  level 8 hours after starting Daily heparin  level and CBC   Kendall Pauls PharmD, BCPS [facility] main pharmacy [phone] 01/04/2024 1:17 PM

## 2024-01-04 NOTE — Consult Note (Addendum)
 Cardiology Consultation   Patient ID: KEILYNN MARANO MRN: [id number]; DOB: [birth year]  Admit date: 12/31/2023 Date of Consult: 01/04/2024  PCP:  Estill Hemming, DO   [facility] Providers Cardiologist:  Jerryl Morin, DO        Patient Profile:   GREGORIA SELVY is a 73 y.o. female with a history of chronic diastolic CHF,  hypertension, type 2 diabetes mellitus, TIA in 2016, emphysema with chronic hypoxic respiratory failure on 4 L of O2 at home, hypothyroidism, GERD, and obesity  who is being seen 01/04/2024 for the evaluation of chest pain at the request of Dr. Gwynneth Lessen.  History of Present Illness:   Ms. Lubinski is a 73 year old female with the above history. She has been seen by Cardiology intermittent in the past for dyspnea on exertion which has been felt to be multifactorial in setting of COPD, diastolic CHF, and obesity. She was referred back to Dr. Emmette Harms in 05/2021 after not being seen since 2019 for further evaluation of shortness of breath and palpitations. Coronary CTA, Echo, and Zio monitor were ordered for further evaluation. Echo showed LVEF of 55-60% with normal wall motion and grade 1 diastolic dysfunction, normal RV, and no significant valvular disease. However, it does not look like coronary CTA or monitor were ever completed. She was lost to follow-up after this. Last Echo in 08/2022 showed LVEF of 55-60% with mild asymmetric LVH of the basal-septal segment.  Patient presented to the [facility] ED on 12/31/2023 after a fall. She rolled out of bed and fell between her bed and nightstand and was unable to get up. She was on the ground for more than 24 hours before her family found her. Initial work-up was significant for hyperglycemia, hypokalemia, AKI, elevated CKD, and elevated lactic acid. She was admitted for rhabdomyolysis and acute metabolic encephalopathy and was started on IV fluids.   Patient started complaining of epigastric/ chest pain last night that has  persistent into this morning. EKG this morning showed normal sinus rhythm with no acute ischemic changes. High-sensitivity troponin was negative on admission but repeat troponin this morning was elevated at 236. Cardiology consulted for further evaluation.   Patient has some underlying confusion and has difficulty elaboration on symptoms. She reports she started having chest pain that she described as a pressure last night with associated dyspnea, nausea, and vomiting but no diaphoresis. Patient given Maalox but continued to have intermittent pain. She continue to have pain this morning which is when EKG and troponin were checked. She currently reports a very mild chest tightness. She states she has had intermittent chest pain with exertion at home in the past. She states this will happen a couple of times a month but does not remember how long it last for. However, she states the chest pain here last night felt different. She has chronic dyspnea on exertion but denies any significant shortness of breath as long as she is wearing her O2. She is on 4L of O2 at home. She has chronic stable orthopnea but no PND. She reports chronic lower extremity edema but states it has been better recently. She also reports intermittent palpitations that can last a couple of hours at a time. She states she has diagnosed herself with atrial fibrillation but has never been diagnosed with this from a medical provider. No lightheadedness, dizziness, or syncope.   She does have a strong family history of CAD on her father's side. Her father required CABG  and multiple paternal uncles died in their 70s from MIs. She has a history of tobacco use but quit 15+ years ago.   Past Medical History:  Diagnosis Date   Allergic rhinitis    Depression    Diabetes mellitus type 2, uncontrolled    Emphysema of lung (HCC)    3L home O2   GERD (gastroesophageal reflux disease)    Hypertension    Hypothyroidism    Obesity, morbid, BMI 50  or higher (HCC)    Stroke (HCC) 2016   TIA    Urine incontinence     Past Surgical History:  Procedure Laterality Date   ABDOMINAL HYSTERECTOMY     CESAREAN SECTION     COLONOSCOPY N/A 08/19/2019   Procedure: COLONOSCOPY;  Surgeon: Ozell Blunt, MD;  Location: [facility];  Service: Endoscopy;  Laterality: N/A;   COLONOSCOPY WITH PROPOFOL  N/A 08/05/2019   Procedure: COLONOSCOPY WITH PROPOFOL ;  Surgeon: Baldo Bonds, MD;  Location: [facility];  Service: Gastroenterology;  Laterality: N/A;   COLONOSCOPY WITH PROPOFOL  N/A 11/19/2019   Procedure: COLONOSCOPY WITH PROPOFOL ;  Surgeon: Lanita Pitman, MD;  Location: [facility];  Service: Endoscopy;  Laterality: N/A;  Unprepped   COLONOSCOPY WITH PROPOFOL  N/A 11/22/2019   Procedure: COLONOSCOPY WITH PROPOFOL ;  Surgeon: Lanita Pitman, MD;  Location: [facility];  Service: Endoscopy;  Laterality: N/A;   COLONOSCOPY WITH PROPOFOL  N/A 11/23/2019   Procedure: COLONOSCOPY WITH PROPOFOL ;  Surgeon: Genell Ken, MD;  Location: [facility];  Service: Gastroenterology;  Laterality: N/A;   COLONOSCOPY WITH PROPOFOL  N/A 11/29/2019   Procedure: COLONOSCOPY WITH PROPOFOL ;  Surgeon: Baldo Bonds, MD;  Location: [facility];  Service: Endoscopy;  Laterality: N/A;   ENTEROSCOPY N/A 11/24/2019   Procedure: ENTEROSCOPY;  Surgeon: Genell Ken, MD;  Location: [facility];  Service: Gastroenterology;  Laterality: N/A;   ENTEROSCOPY N/A 11/27/2019   Procedure: ENTEROSCOPY;  Surgeon: Baldo Bonds, MD;  Location: [facility];  Service: Endoscopy;  Laterality: N/A;   ESOPHAGOGASTRODUODENOSCOPY N/A 11/27/2019   Procedure: ESOPHAGOGASTRODUODENOSCOPY (EGD);  Surgeon: Baldo Bonds, MD;  Location: [facility];  Service: Endoscopy;  Laterality: N/A;   ESOPHAGOGASTRODUODENOSCOPY (EGD) WITH PROPOFOL  N/A 11/24/2019   Procedure: ESOPHAGOGASTRODUODENOSCOPY (EGD) WITH PROPOFOL ;  Surgeon: Genell Ken, MD;  [facility]: [facility];  Service: Gastroenterology;   Laterality: N/A;  PUSH enteroscopy   GIVENS CAPSULE STUDY N/A 11/19/2019   Procedure: GIVENS CAPSULE STUDY;  Surgeon: Lanita Pitman, MD;  [facility]: [facility];  Service: Endoscopy;  Laterality: N/A;  To be performed immediately following colonoscopy   GIVENS CAPSULE STUDY N/A 11/24/2019   Procedure: GIVENS CAPSULE STUDY;  Surgeon: Genell Ken, MD;  Location: [facility];  Service: Gastroenterology;  Laterality: N/A;   GIVENS CAPSULE STUDY N/A 11/28/2019   Procedure: GIVENS CAPSULE STUDY;  Surgeon: Baldo Bonds, MD;  Location: [facility];  Service: Endoscopy;  Laterality: N/A;   HEMOSTASIS CLIP PLACEMENT  11/19/2019   Procedure: HEMOSTASIS CLIP PLACEMENT;  Surgeon: Lanita Pitman, MD;  Location: [facility];  Service: Endoscopy;;   HEMOSTASIS CLIP PLACEMENT  11/22/2019   Procedure: HEMOSTASIS CLIP PLACEMENT;  Surgeon: Lanita Pitman, MD;  Location: [facility];  Service: Endoscopy;;   HOT HEMOSTASIS N/A 11/24/2019   Procedure: HOT HEMOSTASIS (ARGON PLASMA COAGULATION/BICAP);  Surgeon: Genell Ken, MD;  Location: [facility];  Service: Gastroenterology;  Laterality: N/A;   HOT HEMOSTASIS N/A 11/27/2019   Procedure: HOT HEMOSTASIS (ARGON PLASMA COAGULATION/BICAP);  Surgeon: Baldo Bonds, MD;  Location: [facility];  Service: Endoscopy;  Laterality: N/A;   SUBMUCOSAL TATTOO INJECTION  11/19/2019  Procedure: SUBMUCOSAL TATTOO INJECTION;  Surgeon: Lanita Pitman, MD;  Location: [facility];  Service: Endoscopy;;     Home Medications:  Prior to Admission medications   Medication Sig Start Date End Date Taking? Authorizing Provider  albuterol  (VENTOLIN  HFA) 108 (90 Base) MCG/ACT inhaler Inhale 1-2 puffs into the lungs every 6 (six) hours as needed for wheezing or shortness of breath. 12/23/22  Yes Teddi Favors, DO  allopurinol  (ZYLOPRIM ) 100 MG tablet Take 100 mg by mouth daily. 06/10/21  Yes [provider]  busPIRone  (BUSPAR ) 15 MG tablet Take 1 tablet (15 mg total) by mouth  2 (two) times daily. 04/24/23  Yes White, Polly Brink A, NP  Dulaglutide  (TRULICITY ) 3 MG/0.5ML SOAJ Inject 3 mg into the skin once a week. 08/22/23  Yes Shamleffer, Ibtehal Jaralla, MD  empagliflozin  (JARDIANCE ) 10 MG TABS tablet Take 1 tablet (10 mg total) by mouth daily before breakfast. 08/21/23  Yes Shamleffer, Ibtehal Jaralla, MD  famotidine  (PEPCID ) 20 MG tablet Take 20 mg by mouth daily as needed for heartburn or indigestion.   Yes [provider]  ibuprofen  (ADVIL ) 200 MG tablet Take 400 mg by mouth every 6 (six) hours as needed for moderate pain (pain score 4-6).   Yes [provider]  insulin  aspart (NOVOLOG  FLEXPEN) 100 UNIT/ML FlexPen Novolog  10 units with each meal PLUS the scale if needed -Novolog  correctional insulin : ADD extra units on insulin  to your meal-time Novolog  dose if your blood sugars are higher than 160. Use the scale below to help guide you:  Blood sugar before meal Number of units to inject Less than 160 0 unit 161 -  190 1 units 191 -  220 2 units 221 -  250 3 units 251 -  280 4 units 281 -  310 5 units 311 -  340 6 units 341 -  370 7 units 371 -  400 8 units Max daily 45 units 08/22/23  Yes Shamleffer, Ibtehal Jaralla, MD  insulin  glargine, 1 Unit Dial, (TOUJEO  SOLOSTAR) 300 UNIT/ML Solostar Pen INJECT 64 UNITS SUBCUTANEOUSLY ONCE DAILY IN THE AFTERNOON.**APPOINTMENT NEEDED FOR FURTHER REFILLS** Patient taking differently: 74 Units daily. 09/18/23  Yes Shamleffer, Ibtehal Jaralla, MD  levothyroxine  (SYNTHROID ) 100 MCG tablet Take 1 tablet (100 mcg total) by mouth daily before breakfast. 08/22/23  Yes Shamleffer, Ibtehal Jaralla, MD  LORazepam  (ATIVAN ) 1 MG tablet TAKE 1/2 TO 1 AND 1/2 TABLETS AT BEDTIME AND AN ADDITIONAL 1/2 TAB ONCE A DAY AS NEEDED FOR ANXIETY Patient taking differently: Take 0.5-1.5 mg by mouth See admin instructions. Take 0.5mg  (1/2 tablet) to 1.5mg  (1 and 1/2 tablet) by mouth at night as needed for anxiety/sleep. May take an additional 0.5mg  (1/2  tablet) once a day as needed for anxiety. 11/13/23  Yes Mozingo, Regina Nattalie, NP  metoprolol  succinate (TOPROL -XL) 50 MG 24 hr tablet Take 2 tablets (100 mg total) by mouth daily. Take with or immediately following a meal Patient taking differently: Take 50 mg by mouth daily. Take with or immediately following a meal 10/17/23  Yes Lowne Chase, Adel Holt R, DO  potassium chloride  SA (KLOR-CON  M) 20 MEQ tablet Take 1 tablet (20 mEq total) by mouth daily. 10/31/23  Yes Roel Clarity R, DO  pregabalin  (LYRICA ) 50 MG capsule TAKE 1 CAPSULE BY MOUTH EVERY MORNING PLUS 1 CAP MIDDAY, PLUS 2 CAPS IN THE EVENING 11/13/23  Yes Hill, Marvina Slough, MD  QUEtiapine  (SEROQUEL ) 200 MG tablet Take 1 tablet (200 mg total) by mouth at bedtime. 04/24/23  Yes Marita Sidle A, NP  rosuvastatin  (CRESTOR ) 10 MG tablet TAKE 1 TABLET BY MOUTH ONCE DAILY FOR HEART HEALTH AND TO LOWER CHOLESTEROL 09/18/23  Yes Roel Clarity R, DO  torsemide  (DEMADEX ) 20 MG tablet Take 3 tablets (60 mg total) by mouth daily. 07/28/23  Yes Roel Clarity R, DO  zolpidem  (AMBIEN  CR) 12.5 MG CR tablet TAKE 1 TABLET BY MOUTH AT BEDTIME AS NEEDED FOR SLEEP Patient taking differently: Take 12.5 mg by mouth at bedtime. for sleep 11/13/23  Yes Mozingo, Regina Nattalie, NP  Accu-Chek Softclix Lancets lancets USE 1  TO CHECK GLUCOSE 4 TIMES DAILY 01/16/23   Crecencio Dodge, Yvonne R, DO  Blood Glucose Monitoring Suppl (ACCU-CHEK GUIDE) w/Device KIT USE   TO CHECK GLUCOSE UP TO 4 TIMES DAILY AS DIRECTED 11/11/22   Crecencio Dodge, Yvonne R, DO  glucose blood (ACCU-CHEK GUIDE) test strip USE 1 STRIP TO CHECK GLUCOSE 4 TIMES DAILY 11/04/22   Roel Clarity R, DO  Insulin  Pen Needle (BD PEN NEEDLE NANO U/F) 32G X 4 MM MISC Use to inject insulin  08/22/23   Shamleffer, Julian Obey, MD  lidocaine  (LIDODERM ) 5 % Place 1 patch onto the skin daily. Remove & Discard patch within 12 hours or as directed by MD Patient not taking: Reported on 12/31/2023 12/29/23   Kingsley,  Victoria K, DO  magic mouthwash w/lidocaine  SOLN Take 5 mLs by mouth 4 (four) times daily as needed for mouth pain. Swish and Spit Patient not taking: Reported on 12/31/2023 12/28/23   Crecencio Dodge, Candida Chalk, DO  oxyCODONE  (ROXICODONE ) 5 MG immediate release tablet Take 1 tablet (5 mg total) by mouth every 6 (six) hours as needed. Patient not taking: Reported on 12/31/2023 12/29/23   Kingsley, Victoria K, DO  OXYGEN  Inhale 3-4 L/min into the lungs continuous.     [provider]  senna-docusate (SENOKOT-S) 8.6-50 MG tablet Take 1 tablet by mouth daily. Patient not taking: Reported on 12/31/2023 12/29/23   Kingsley, Victoria K, DO    Inpatient Medications: Scheduled Meds:  acetaminophen   1,000 mg Oral Once   [START ON 01/05/2024] aspirin  EC  81 mg Oral Daily   aspirin   325 mg Oral Daily   enoxaparin  (LOVENOX ) injection  40 mg Subcutaneous QHS   insulin  aspart  0-15 Units Subcutaneous TID WC   insulin  aspart  0-5 Units Subcutaneous QHS   insulin  glargine-yfgn  45 Units Subcutaneous QHS   levothyroxine   100 mcg Oral QAC breakfast   metoprolol  succinate  100 mg Oral Daily   pregabalin   100 mg Oral QHS   pregabalin   50 mg Oral BID   zolpidem   5 mg Oral QHS   Continuous Infusions:  PRN Meds: acetaminophen  **OR** acetaminophen , allopurinol , alum & mag hydroxide-simeth, ibuprofen , methocarbamol  (ROBAXIN ) injection, ondansetron  **OR** ondansetron  (ZOFRAN ) IV, oxyCODONE   Allergies:    Allergies  Allergen Reactions   Hydrocodone  Other (See Comments)    Constipation and hallucinations   Norvasc  [Amlodipine  Besylate] Swelling and Other (See Comments)    Marked swelling of the limbs   Tizanidine  Other (See Comments)    After the 3rd dose, the patient's mouth began to feel numb and she felt like she was a having a "hot flash"    Social History:   Social History   Socioeconomic History   Marital status: Divorced    Spouse name: Not on file   Number of children: 4   Years of  education: 14   Highest education level: Associate degree:  academic program  Occupational History   Occupation: disabled- [employer]  Tobacco Use   Smoking status: Former    Current packs/day: 0.00    Average packs/day: 1 pack/day for 40.0 years (40.0 ttl pk-yrs)    Types: Cigarettes    Start date: 07/21/1972    Quit date: 10/16/2011    Years since quitting: 12.2   Smokeless tobacco: Never  Vaping Use   Vaping status: Never Used  Substance and Sexual Activity   Alcohol use: Not Currently    Comment: Occ-- Wine   Drug use: No   Sexual activity: Not Currently  Other Topics Concern   Not on file  Social History Narrative      Originally from [place]. Has also lived in [place], [place]. She also previously lived in [place] for 20 years. No history of Valley Fever. Moved to [place] in 1989. 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 No pets currently. No bird exposure. Questionable previous mold exposure in her daughter's home. 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       Lives in [place] alone in assisted living.  Has multiple chronic illness.      Are you right handed or left handed? Right handed   Are you currently employed ? no   What is your current occupation? retired   Do you live at home alone?no   Who lives with you? son   What type of home do you live in: 1 story or 2 story? Apartment first floor       Social Drivers of Health   Financial Resource Strain: Low Risk  (01/24/2022)   Overall Financial Resource Strain (CARDIA)    Difficulty of Paying Living Expenses: Not hard at all  Food Insecurity: No Food Insecurity (12/31/2023)   Hunger Vital Sign    Worried About Running Out of Food in the Last Year: Never true    Ran Out of Food in the Last Year:  Never true  Transportation Needs: No Transportation Needs (12/31/2023)   PRAPARE - Administrator, Civil Service (Medical): No    Lack of Transportation (Non-Medical): No  Physical Activity: Inactive (07/04/2023)   Exercise Vital Sign    Days of Exercise per Week: 0 days    Minutes of Exercise per Session: 0 min  Stress: Stress Concern Present (07/04/2023)   [employer] of Occupational Health - Occupational Stress Questionnaire    Feeling of Stress : To some extent  Social Connections: Unknown (01/02/2024)   Social Connection and Isolation Panel [NHANES]    Frequency of Communication with Friends and Family: Three times a week    Frequency of Social Gatherings with Friends and Family: Three times a week    Attends Religious Services: Patient declined    Active Member of Clubs or Organizations: Yes    Attends Banker Meetings: More than 4 times per year    Marital Status: Divorced  Intimate Partner Violence: Not At Risk (12/31/2023)   Humiliation, Afraid, Rape, and Kick questionnaire    Fear of Current or Ex-Partner: No    Emotionally Abused: No    Physically Abused: No    Sexually Abused: No    Family History:   Family History  Problem Relation Age of  Onset   Heart disease Father        MVP and Pics Valve   Hypertension Father    Depression Father        Institutionalized x's 2 years   Bipolar disorder Father    Hypertension Sister    Diabetes Sister    Hyperlipidemia Sister    Heart disease Sister 53       MI   Heart disease Brother    Hypertension Brother    Schizophrenia Paternal Aunt    Depression Paternal Aunt    Anxiety disorder Paternal Aunt    Heart disease Paternal Aunt    Schizophrenia Paternal Aunt    Heart disease Paternal Uncle    Heart disease Paternal Grandmother    Asthma Son    Asthma Son      ROS:  Please see the history of present illness.   Physical Exam/Data:   Vitals:   01/03/24 2144 01/04/24 0011 01/04/24  0529 01/04/24 0841  BP: 126/69 115/82 115/72 109/84  Pulse: 77 (!) 105 92 86  Resp: 17 19 18 17   Temp: 98 F (36.7 C) 98.5 F (36.9 C) 98.8 F (37.1 C)   TempSrc: Oral Oral Oral   SpO2: 96% 96% 97% 99%  Weight:      Height:        Intake/Output Summary (Last 24 hours) at 01/04/2024 1303 Last data filed at 01/04/2024 0600 Gross per 24 hour  Intake 360 ml  Output --  Net 360 ml      12/31/2023    3:50 PM 12/29/2023    7:01 PM 11/07/2023    3:20 PM  Last 3 Weights  Weight (lbs) 260 lb 270 lb 268 lb 6.4 oz  Weight (kg) 117.935 kg 122.471 kg 121.745 kg     Body mass index is 46.06 kg/m.  General: 73 y.o. obese African-American female resting comfortably in no acute distress. HEENT: Normocephalic and atraumatic. Sclera clear.  Neck: Supple. JVD difficult to assess due to body habitus. Heart: RRR. Aaron Aas No murmurs, gallops, or rubs. Lungs: No increased work of breathing. Clear to ausculation bilaterally. No wheezes, rhonchi, or rales.  Abdomen: Soft, non-distended, and non-tender to palpation.  Extremities: No significant lower extremity edema.  Skin: Warm and dry. Neuro: Alert and oriented x3 but mild confusion. No focal deficits. Psych: Normal affect.   EKG:  The EKG was personally reviewed and demonstrates:  Normal sinus rhythm, rate 100 bpm, with no acute ischemic changes.  Telemetry:  Not currently on telemetry.  Relevant CV Studies:  Echocardiogram 09/02/2022: Impressions: 1. Limited study. Cannot fully assess wall motion. Left ventricular  ejection fraction, by estimation, is 55 to 60%. The left ventricle has  normal function. There is mild asymmetric left ventricular hypertrophy of  the basal-septal segment. Left  ventricular diastolic parameters are indeterminate.   2. Right ventricular systolic function was not well visualized. The right  ventricular size is not well visualized.   3. The mitral valve is grossly normal. No evidence of mitral valve  regurgitation.  No evidence of mitral stenosis.   4. The aortic valve was not well visualized. Aortic valve regurgitation  is not visualized. No aortic stenosis is present.   Comparison(s): No significant change from prior study.    Laboratory Data:  High Sensitivity Troponin:   Recent Labs  Lab 12/31/23 1706 12/31/23 2108 01/04/24 0927 01/04/24 1103  TROPONINIHS 17 15 236* 235*     Chemistry Recent Labs  Lab 12/31/23 1706 01/01/24  1610 01/02/24 9604 01/03/24 0338 01/04/24 0927  NA 139   < > 133* 137 139  K 2.9*   < > 3.3* 3.0* 3.8  CL 98   < > 95* 100 100  CO2 29   < > 30 28 30   GLUCOSE 359*   < > 171* 146* 130*  BUN 17   < > 17 15 15   CREATININE 2.06*   < > 1.62* 1.41* 1.53*  CALCIUM  9.3   < > 8.8* 8.7* 9.2  MG 1.9  --   --   --   --   GFRNONAA 25*   < > 34* 40* 36*  ANIONGAP 12   < > 8 9 9    < > = values in this interval not displayed.    Recent Labs  Lab 12/31/23 1706  PROT 8.6*  ALBUMIN  2.8*  AST 40  ALT 18  ALKPHOS 107  BILITOT 1.0   Lipids No results for input(s): "CHOL", "TRIG", "HDL", "LABVLDL", "LDLCALC", "CHOLHDL" in the last 168 hours.  Hematology Recent Labs  Lab 01/01/24 0347 01/02/24 0642 01/04/24 0927  WBC 10.2 7.1 13.0*  RBC 3.80* 3.90 4.02  HGB 11.5* 11.8* 12.2  HCT 37.1 38.5 39.1  MCV 97.6 98.7 97.3  MCH 30.3 30.3 30.3  MCHC 31.0 30.6 31.2  RDW 14.5 14.6 14.4  PLT 156 175 193   Thyroid  No results for input(s): "TSH", "FREET4" in the last 168 hours.  BNPNo results for input(s): "BNP", "PROBNP" in the last 168 hours.  DDimer No results for input(s): "DDIMER" in the last 168 hours.   Radiology/Studies:  DG Chest Port 1 View Result Date: 01/04/2024 CLINICAL DATA:  Dyspnea EXAM: PORTABLE CHEST 1 VIEW COMPARISON:  December 31, 2023 FINDINGS: Mild bilateral reticular interstitial prominence correlate with chronic interstitial lung disease perhaps superimposed mild bilateral congestive changes with basilar hypoventilatory atelectasis without  significant pleural effusions No consolidation to indicate pneumonia Heart and mediastinum within normal limits. IMPRESSION: Mild bilateral reticular interstitial prominence correlate with chronic interstitial lung disease perhaps superimposed mild bilateral congestive changes with basilar hypoventilatory atelectasis without significant pleural effusions. Electronically Signed   By: Fredrich Jefferson M.D.   On: 01/04/2024 10:13   DG Hip Unilat With Pelvis 2-3 Views Right Result Date: 12/31/2023 CLINICAL DATA:  Fall. Found down at home by family. On floor for 2 days. EXAM: DG HIP (WITH OR WITHOUT PELVIS) 2-3V RIGHT COMPARISON:  Pelvis and right hip radiographs 12/29/2023 FINDINGS: Moderate bilateral femoroacetabular joint space narrowing. No acute fracture. Mild bilateral sacroiliac subchondral sclerosis. The pubic symphysis joint space is maintained. 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 No acute fracture. Electronically Signed   By: Bertina Broccoli M.D.   On: 12/31/2023 18:19   DG Foot Complete Left Result Date: 12/31/2023 CLINICAL DATA:  Fall. Found at home by family in prone position. Down for 2 days. EXAM: LEFT FOOT - COMPLETE 3+ VIEW COMPARISON:  Left tibia and fibula radiographs 03/03/2023 FINDINGS: Mild hallux valgus. Old healed fracture of the distal shaft of the fourth metatarsal with mild medial displacement of the distal fracture component with respect to the proximal fracture component. Old healed fracture of the fifth metatarsal with approximately 4 mm dorsal displacement and minimal medial displacement of the distal fracture component with respect to the proximal fracture component. Tiny posterior calcaneal heel spur. No acute fracture or dislocation. IMPRESSION: 1. No acute fracture. 2. Old healed fractures of the fourth and fifth metatarsals. Electronically Signed   By: Currie Douse  Viola M.D.   On: 12/31/2023 18:18   DG Chest Port 1 View Result Date:  12/31/2023 CLINICAL DATA:  Fall. Found at home by family in prone position. Patient reports she has been on the floor for 2 days. EXAM: PORTABLE CHEST 1 VIEW COMPARISON:  AP chest 12/23/2022, 10/28/2022 FINDINGS: The heart size and mediastinal contours are within normal limits. Both lungs are clear. The visualized skeletal structures are unremarkable. IMPRESSION: No active disease. Electronically Signed   By: Bertina Broccoli M.D.   On: 12/31/2023 18:06     Assessment and Plan:   NSTEMI Patient was admitted for rhabdomyolysis after rolling off her bed and getting stuck in between her bed and her night stand. She was on the ground for about 36 hours before her family found her. She started having some epigastric/ chest pain yesterday evening. Initial high-sensitivity troponin on admission was negative x2; however, repeat troponin this morning elevated at 236 >> 235. EKG shows no acute ischemic changes. - Patient reports chest pressure with associated dyspnea, nausea, and vomiting last night. Pain has since improved but she is still currently having some very mild chest tightness. She also reports intermittent exertional chest pain at home that occurs a couple of times a month. - Will start IV Heparin .  - Will given Aspirin  325mg  now and then start 81mg  daily tomorrow. - Continue Toprol -XL 100mg  daily.  - Will hold off on statin for now given rhabdomyolysis and recheck CKD. - Troponin is flat. Mild troponin elevation can be seen in rhabdomyolysis but interestingly her initial troponin was negative. She also has multiple CV risk factors as well as a strong family history of CAD. Will go ahead and start IV Heparin  and treat as ACS. However, will wait on Echo results to decide on proceeding with cath given underlying CKD stage IIIb and recent rhabdomyolysis.   Chronic Diastolic CHF Last Echo in 08/2022 showed LVEF of 55-60% with mild asymmetric LVH of the basal-septal segment. - Euvolemic on exam.  - Home  Torsemide  and Jardiance  held on admission in setting of AKI and rhabdomyolysis.  Continue to hold for now.  Palpitations Patient has a history of intermittent palpitations lasting a couple of hours at a time. Prior monitor was ordered in 2022 but it does not look like this was completed.  - Potassium initially 2.9. Repleted and 3.8 today. - Magnesium  1.9 on admission. - TSH normal in 10/2023.  - Continue Toprol -XL 100mg  daily.  - Will place on telemetry. Consider outpatient monitor if telemetry does not show anything.   Hypertension BP well controlled. - Continue Toprol -XL 100mg  daily.   Hyperlipidemia LDL 33 in 10/2023.  - Crestor  held in setting of rhabdomyolysis. Will repeat CK. If this has normalized, consider restarting Crestor  in setting of NSTEMI.   AKI vs CKD Stage IIIb Creatinine 2.06 on admission in setting of rhabdomyolysis. Last know creatinine 1.91 in 10/2023 but previously in 1.6 to 1.7 range in 2024.  - Renal function has improved with IV fluids. Creatinine 1.53 today.  - Continue to monitor.  Otherwise, per primary team: - Rhabdomyolysis (CK initially 1,427) - Acute metabolic encephalopathy - Elevated lactic acid  - COPD  - Hypothyroidism - Type 2 diabetes mellitus - GERD - History of CVA   TIMI Risk Score for Unstable Angina or Non-ST Elevation MI:   The patient's TIMI risk score is 4, which indicates a 20% risk of all cause mortality, new or recurrent myocardial infarction or need for urgent revascularization in the  next 14 days.{   New [place]  Heart Association (NYHA) Functional Class NYHA Class II-III  For questions or updates, please contact [facility] Please consult [email or web address] for contact info under    Signed, Callie E Goodrich, PA-C  01/04/2024 1:03 PM   Patient seen and examined.  Agree with below documentation.  Ms. Mehlman is a 73 year old female with history of chronic diastolic heart failure, T2DM, hypertension, TIA, COPD on 4 L O2  at home, morbid obesity who we are consulted by Dr. Gwynneth Lessen for evaluation of chest pain and troponin elevation.  She underwent an echocardiogram in 2022 which showed EF 55 to 60%.  Coronary CTA was also ordered but appears was not done.  Most recent echocardiogram 08/2022 showed EF 55 to 60%.  She was admitted to [facility] on 12/31/2023.  She rolled out of her bed and fell between her bed and nightstand and could not get up.  She was down for over 24 hours before she was found by her family.  Initial labs notable for potassium 2.9, creatinine 2.06, CK 1427, troponin 17 > 15, lactate 4.0, hemoglobin 13, platelets 184, WBC 10.8.  Chest x-ray showed no active disease.  Overnight she reported chest pain.  EKG shows normal sinus rhythm, rate 100, nonspecific T wave flattening, inferior Q waves (not present on prior EKG but are seen on previous EKGs).  Troponin today 236 > 235. On exam, patient is somnolent but arousable, oriented to person and place, regular rate and rhythm, no murmurs, lungs CTAB, no LE edema.  She is reporting chest pain but difficult historian; she is confused, oriented x2.  Troponins could be demand ischemia in setting of her acute illness with rhabdo, but interestingly were negative on admission.  She is not a good candidate for cath currently with her confusion and AKI.  Recommend echo and will manage medically for now.  Will start on ASA and heparin  gtt.  Hold off on statin given rhabdo.   Wendie Hamburg, MD

## 2024-01-04 NOTE — Progress Notes (Signed)
 Patient c/o indigestion and Mylanta given. Patient had emesis episode x 1 (undigested food) within 30 minutes of taking the medication. Patient assessed with continued c/o nausea and given Zofran  4 mg IV. Will reassess the patient.

## 2024-01-04 NOTE — Progress Notes (Signed)
 PROGRESS NOTE  Sue [name]  DOB: 1950/09/20  PCP: Sue Hemming, DO EXB:[id number]  DOA: 12/31/2023  LOS: 4 days  Hospital Day: 5  Brief narrative: Sue [name] is a 73 y.o. female with PMH significant for morbid obesity, DM2, HTN, stroke/TIA, COPD on 3 L oxygen , GERD, hypothyroidism. 4/20, patient was brought to the ED for a fall. Patient had rolled out of bed and fallen between her nightstand and was unable to get up.  She was reportedly on the ground for more than 24 hours until her family found her.    On initial workup, she was noted to have hyperglycemia, hypokalemia, elevated creatinine, elevated lactic acid and elevated CK Skeletal survey was unremarkable for any fracture. Patient started on IV fluid Per patient's daughter, there was concern of excessive pain control usage resulting to intermittent episodes of confusion.  See below for details  Subjective: Patient was seen and examined this morning.  Events of last night noted.  Patient complained of chest pain.  EKG was unremarkable. At the time of my evaluation this morning, patient was sitting up in bed.  He still complained of chest pain.  Denied any history of CAD or stent in the past. Troponin stat was obtained which came elevated to 236.  Cardiology was consulted.  Assessment and plan: NSTEMI Complain of chest pain since last night.  Troponin elevated this morning. See primary presented with rhabdomyolysis and elevated CK.  Troponin was apparently never done so no baseline to compare with.  Repeat troponin and echocardiogram ordered. Cardiology consulted.  Defer to cardiology for further workup Recent Labs    01/02/24 0642 01/04/24 0927  CKTOTAL 606*  --   TROPONINIHS  --  236*    Rhabdomyolysis Because of being on the floor for more than 24-hour duration CK level gradually improved with IV fluid.  Currently not on IV hydration.  Oral hydration encouraged. Recent Labs  Lab 12/31/23 1706  01/01/24 0347 01/02/24 0642  CKTOTAL 1,427* 1,021* 606*    Hypokalemia Potassium level low at 3 this morning.  Oral replacement given. Recent Labs  Lab 12/31/23 1706 01/01/24 0347 01/02/24 0642 01/03/24 0338 01/04/24 0927  K 2.9* 3.3* 3.3* 3.0* 3.8  MG 1.9  --   --   --   --    Acute metabolic encephalopathy  chronic pain Insomnia Mental status better today. PTA meds-  Lyrica  3 times daily, Seroquel  nightly, Ambien  nightly  Ativan  scheduled and as needed, oxycodone  as needed, Currently continued on Ambien  and Lyrica  nightly.  Continue to hold orders.   Type 2 diabetes mellitus A1c 10.3 on 11/07/2023 PTA meds-Trulicity , Jardiance , Lantus  74 units daily, sliding scale insulin  Currently blood sugar level is adequately controlled on Semglee  45 units nightly and SSI/Accu-Cheks Recent Labs  Lab 01/03/24 0735 01/03/24 1146 01/03/24 1628 01/03/24 2143 01/04/24 0730  GLUCAP 121* 105* 168* 155* 148*   Hypertension Continue metoprolol  On hold torsemide   Prior CVA  HLD Statin on hold because of rhabdomyolysis  COPD Chronic hypoxic respiratory failure On 3 L oxygen  at home. Continue bronchodilators   Hypothyroidism Continue Synthroid    Mobility: Encourage ambulation.  PT eval obtained.  SNF recommended  Goals of care   Code Status: Full Code     DVT prophylaxis:  enoxaparin  (LOVENOX ) injection 40 mg Start: 12/31/23 2200 SCDs Start: 12/31/23 1955   Antimicrobials: None Fluid: Not on IV fluid Consultants: None Family Communication: Family not at bedside today  Status: Inpatient Level of care:  Med-Surg   Patient is from: Home Needs to continue in-hospital care: Unstable discharge because of new chest pain and elevated troponin Anticipated d/c to: Family is planning for home with home health eventually.    Diet:  Diet Order             Diet Carb Modified Fluid consistency: Thin; Room service appropriate? Yes  Diet effective now                    Scheduled Meds:  acetaminophen   1,000 mg Oral Once   enoxaparin  (LOVENOX ) injection  40 mg Subcutaneous QHS   insulin  aspart  0-15 Units Subcutaneous TID WC   insulin  aspart  0-5 Units Subcutaneous QHS   insulin  glargine-yfgn  45 Units Subcutaneous QHS   levothyroxine   100 mcg Oral QAC breakfast   metoprolol  succinate  100 mg Oral Daily   pregabalin   100 mg Oral QHS   pregabalin   50 mg Oral BID   zolpidem   5 mg Oral QHS    PRN meds: acetaminophen  **OR** acetaminophen , allopurinol , alum & mag hydroxide-simeth, ibuprofen , methocarbamol  (ROBAXIN ) injection, ondansetron  **OR** ondansetron  (ZOFRAN ) IV, oxyCODONE    Infusions:     Antimicrobials: Anti-infectives (From admission, onward)    None       Objective: Vitals:   01/04/24 0529 01/04/24 0841  BP: 115/72 109/84  Pulse: 92 86  Resp: 18 17  Temp: 98.8 F (37.1 C)   SpO2: 97% 99%    Intake/Output Summary (Last 24 hours) at 01/04/2024 1135 Last data filed at 01/04/2024 0600 Gross per 24 hour  Intake 600 ml  Output --  Net 600 ml   Filed Weights   12/31/23 1550  Weight: 117.9 kg   Weight change:  Body mass index is 46.06 kg/m.   Physical Exam: General exam: Pleasant, elderly African-American female.  Distress.  Chest pain today Skin: No rashes, lesions or ulcers. HEENT: Atraumatic, normocephalic, no obvious bleeding Lungs: Clear to auscultation bilaterally,  CVS: S1, S2, no murmur,   GI/Abd: Soft, nontender, nondistended, bowel sound present,   CNS: Alert, awake, speech clear.  Slow to respond but oriented x 3  psychiatry: Sad affect Extremities: No pedal edema, no calf tenderness,   Data Review: I have personally reviewed the laboratory data and studies available.  F/u labs ordered Unresulted Labs (From admission, onward)    None       Total time spent in review of labs and imaging, patient evaluation, formulation of plan, documentation and communication with family: 45  minutes  Signed, Hoyt Macleod, MD [facility] 01/04/2024

## 2024-01-05 DIAGNOSIS — I214 Non-ST elevation (NSTEMI) myocardial infarction: Secondary | ICD-10-CM | POA: Diagnosis not present

## 2024-01-05 DIAGNOSIS — M6282 Rhabdomyolysis: Secondary | ICD-10-CM | POA: Diagnosis not present

## 2024-01-05 DIAGNOSIS — N179 Acute kidney failure, unspecified: Secondary | ICD-10-CM | POA: Diagnosis not present

## 2024-01-05 LAB — GLUCOSE, CAPILLARY
Glucose-Capillary: 98 mg/dL (ref 70–99)
Glucose-Capillary: 99 mg/dL (ref 70–99)
Glucose-Capillary: 124 mg/dL — ABNORMAL HIGH (ref 70–99)
Glucose-Capillary: 101 mg/dL — ABNORMAL HIGH (ref 70–99)

## 2024-01-05 LAB — BASIC METABOLIC PANEL WITH GFR
Anion gap: 10 (ref 5–15)
BUN: 17 mg/dL (ref 8–23)
CO2: 31 mmol/L (ref 22–32)
Calcium: 9 mg/dL (ref 8.9–10.3)
Chloride: 95 mmol/L — ABNORMAL LOW (ref 98–111)
Creatinine, Ser: 2.03 mg/dL — ABNORMAL HIGH (ref 0.44–1.00)
GFR, Estimated: 26 mL/min — ABNORMAL LOW (ref >60)
Glucose, Bld: 106 mg/dL — ABNORMAL HIGH (ref 70–99)
Potassium: 3.3 mmol/L — ABNORMAL LOW (ref 3.5–5.1)
Sodium: 136 mmol/L (ref 135–145)

## 2024-01-05 LAB — HEPARIN LEVEL (UNFRACTIONATED)
Heparin Unfractionated: 0.19 [IU]/mL — ABNORMAL LOW (ref 0.30–0.70)
Heparin Unfractionated: 0.27 [IU]/mL — ABNORMAL LOW (ref 0.30–0.70)
Heparin Unfractionated: 0.42 [IU]/mL (ref 0.30–0.70)

## 2024-01-05 LAB — CBC
HCT: 34.8 % — ABNORMAL LOW (ref 36.0–46.0)
Hemoglobin: 11.1 g/dL — ABNORMAL LOW (ref 12.0–15.0)
MCH: 30.5 pg (ref 26.0–34.0)
MCHC: 31.9 g/dL (ref 30.0–36.0)
MCV: 95.6 fL (ref 80.0–100.0)
Platelets: 173 10*3/uL (ref 150–400)
RBC: 3.64 MIL/uL — ABNORMAL LOW (ref 3.87–5.11)
RDW: 14.4 % (ref 11.5–15.5)
WBC: 18 10*3/uL — ABNORMAL HIGH (ref 4.0–10.5)
nRBC: 0 % (ref 0.0–0.2)

## 2024-01-05 MED ORDER — POTASSIUM CHLORIDE CRYS ER 20 MEQ PO TBCR
40.0000 meq | EXTENDED_RELEASE_TABLET | Freq: Once | ORAL | Status: AC
  Administered 2024-01-05: 40 meq via ORAL
  Filled 2024-01-05: qty 2

## 2024-01-05 MED ORDER — PANTOPRAZOLE SODIUM 40 MG IV SOLR
40.0000 mg | Freq: Two times a day (BID) | INTRAVENOUS | Status: DC
  Administered 2024-01-06: 40 mg via INTRAVENOUS

## 2024-01-05 MED ORDER — HEPARIN BOLUS VIA INFUSION
2000.0000 [IU] | Freq: Once | INTRAVENOUS | Status: AC
Start: 2024-01-05 — End: 2024-01-05
  Administered 2024-01-05: 2000 [IU] via INTRAVENOUS
  Filled 2024-01-05: qty 2000

## 2024-01-05 MED ORDER — SODIUM CHLORIDE 0.9 % IV SOLN: INTRAVENOUS | Status: DC

## 2024-01-05 MED ORDER — ALBUTEROL SULFATE (2.5 MG/3ML) 0.083% IN NEBU
2.5000 mg | INHALATION_SOLUTION | RESPIRATORY_TRACT | Status: DC | PRN
  Administered 2024-01-05: 2.5 mg via RESPIRATORY_TRACT
  Filled 2024-01-05: qty 3

## 2024-01-05 MED ORDER — PANTOPRAZOLE SODIUM 40 MG IV SOLR
40.0000 mg | Freq: Two times a day (BID) | INTRAVENOUS | Status: DC
  Administered 2024-01-05: 40 mg via INTRAVENOUS
  Filled 2024-01-05 (×2): qty 10

## 2024-01-05 MED ORDER — HEPARIN BOLUS VIA INFUSION
1500.0000 [IU] | Freq: Once | INTRAVENOUS | Status: AC
  Administered 2024-01-05: 1500 [IU] via INTRAVENOUS
  Filled 2024-01-05: qty 1500

## 2024-01-05 NOTE — Progress Notes (Signed)
 PHARMACY - ANTICOAGULATION CONSULT NOTE  Pharmacy Consult for Heparin  Indication: chest pain/ACS  Allergies  Allergen Reactions   Hydrocodone  Other (See Comments)    Constipation and hallucinations   Norvasc  [Amlodipine  Besylate] Swelling and Other (See Comments)    Marked swelling of the limbs   Tizanidine  Other (See Comments)    After the 3rd dose, the patient's mouth began to feel numb and she felt like she was a having a "hot flash"    Patient Measurements: Height: 5\' 3"  (160 cm) Weight: 117.9 kg (260 lb) IBW/kg (Calculated) : 52.4 HEPARIN  DW (KG): 81.2  Vital Signs: Temp: 98.4 F (36.9 C) (04/25 0428) Temp Source: Oral (04/25 0428) BP: 117/67 (04/25 0428) Pulse Rate: 77 (04/25 0428)  Labs: Recent Labs    01/03/24 0338 01/04/24 0927 01/04/24 1103 01/04/24 1320 01/04/24 2317 01/05/24 0344  HGB  --  12.2  --   --   --  11.1*  HCT  --  39.1  --   --   --  34.8*  PLT  --  193  --   --   --  173  HEPARINUNFRC  --   --   --   --  0.19*  --   CREATININE 1.41* 1.53*  --   --   --  2.03*  CKTOTAL  --   --   --  325*  --   --   TROPONINIHS  --  236* 235*  --   --   --     Estimated Creatinine Clearance: 31.5 mL/min (A) (by C-G formula based on SCr of 2.03 mg/dL (H)).   Medical History: Past Medical History:  Diagnosis Date   Allergic rhinitis    Depression    Diabetes mellitus type 2, uncontrolled    Emphysema of lung (HCC)    3L home O2   GERD (gastroesophageal reflux disease)    Hypertension    Hypothyroidism    Obesity, morbid, BMI 50 or higher (HCC)    Stroke (HCC) 2016   TIA    Urine incontinence     Medications:  Scheduled:   acetaminophen   1,000 mg Oral Once   aspirin  EC  81 mg Oral Daily   insulin  aspart  0-15 Units Subcutaneous TID WC   insulin  aspart  0-5 Units Subcutaneous QHS   insulin  glargine-yfgn  45 Units Subcutaneous QHS   levothyroxine   100 mcg Oral QAC breakfast   metoprolol  succinate  100 mg Oral Daily   pregabalin   100 mg Oral  QHS   pregabalin   50 mg Oral BID   zolpidem   5 mg Oral QHS   Infusions:   heparin  1,300 Units/hr (01/05/24 0119)    Assessment: 87 yoF admitted on 4/20 with rhabdomyolysis after falling and was on the ground for more than 24 hours.  On 4/23 PM, she complained of chest pain, on 4/24 Pharmacy is consulted to dose Heparin  IV for ACS.  No PTA anticoagulation.  She has been on DVT prophylaxis Lovenox  40mg  - last given on 4/23 at 22:00  Today, 01/05/2024: Heparin  level 0.27, subtherapeutic on heparin  1300 units/hr CBC: Hgb down to 11.1, Plt WNL No bleeding or complications reported.  No interruptions or pauses per RN.   SCr increased 1.5 > 2  Goal of Therapy:  Heparin  level 0.3-0.7 units/ml Monitor platelets by anticoagulation protocol: Yes   Plan:  Give heparin  1500 units bolus IV x 1 Increase to heparin  IV infusion at 1450 units/hr Heparin  level 8 hours after  rate change Daily heparin  level and CBC  Kendall Pauls PharmD, BCPS [facility] main pharmacy [phone] 01/05/2024 10:01 AM

## 2024-01-05 NOTE — Progress Notes (Signed)
 Rounding Note    Patient Name: Sue [name] Date of Encounter: 01/05/2024  [facility] Cardiologist: Kardie Tobb, DO   Subjective   BP 133/74.  Worsening renal function, creatinine 2.0 today.  Mental status has improved, she is now alert and oriented.  Denies any chest pain or dyspnea currently  Inpatient Medications    Scheduled Meds:  acetaminophen   1,000 mg Oral Once   aspirin  EC  81 mg Oral Daily   insulin  aspart  0-15 Units Subcutaneous TID WC   insulin  aspart  0-5 Units Subcutaneous QHS   insulin  glargine-yfgn  45 Units Subcutaneous QHS   levothyroxine   100 mcg Oral QAC breakfast   metoprolol  succinate  100 mg Oral Daily   pregabalin   100 mg Oral QHS   zolpidem   5 mg Oral QHS   Continuous Infusions:  sodium chloride  75 mL/hr at 01/05/24 1225   heparin  1,450 Units/hr (01/05/24 1006)   PRN Meds: acetaminophen  **OR** acetaminophen , albuterol , allopurinol , alum & mag hydroxide-simeth, methocarbamol  (ROBAXIN ) injection, ondansetron  **OR** ondansetron  (ZOFRAN ) IV, oxyCODONE    Vital Signs    Vitals:   01/04/24 2336 01/05/24 0428 01/05/24 1339 01/05/24 1405  BP: (!) 97/57 117/67  133/74  Pulse: 88 77 82 83  Resp: 18 19 18 18   Temp: 99.6 F (37.6 C) 98.4 F (36.9 C)  98.2 F (36.8 C)  TempSrc: Oral Oral  Oral  SpO2: 95% 97% 94% 99%  Weight:      Height:        Intake/Output Summary (Last 24 hours) at 01/05/2024 1410 Last data filed at 01/05/2024 0553 Gross per 24 hour  Intake 469.27 ml  Output --  Net 469.27 ml      12/31/2023    3:50 PM 12/29/2023    7:01 PM 11/07/2023    3:20 PM  Last 3 Weights  Weight (lbs) 260 lb 270 lb 268 lb 6.4 oz  Weight (kg) 117.935 kg 122.471 kg 121.745 kg      Telemetry    Sinus rhythm- Personally Reviewed  ECG    No new ECG- Personally Reviewed  Physical Exam   GEN: No acute distress.   Neck: No JVD appreciated Cardiac: RRR, no murmurs, rubs, or gallops.  Respiratory: Diminished breath sounds GI:  Soft, nontender, non-distended  MS: No edema; No deformity. Neuro:  Nonfocal  Psych: Normal affect   Labs    High Sensitivity Troponin:   Recent Labs  Lab 12/31/23 1706 12/31/23 2108 01/04/24 0927 01/04/24 1103  TROPONINIHS 17 15 236* 235*     Chemistry Recent Labs  Lab 12/31/23 1706 01/01/24 0347 01/03/24 0338 01/04/24 0927 01/05/24 0344  NA 139   < > 137 139 136  K 2.9*   < > 3.0* 3.8 3.3*  CL 98   < > 100 100 95*  CO2 29   < > 28 30 31   GLUCOSE 359*   < > 146* 130* 106*  BUN 17   < > 15 15 17   CREATININE 2.06*   < > 1.41* 1.53* 2.03*  CALCIUM  9.3   < > 8.7* 9.2 9.0  MG 1.9  --   --   --   --   PROT 8.6*  --   --   --   --   ALBUMIN  2.8*  --   --   --   --   AST 40  --   --   --   --   ALT 18  --   --   --   --  ALKPHOS 107  --   --   --   --   BILITOT 1.0  --   --   --   --   GFRNONAA 25*   < > 40* 36* 26*  ANIONGAP 12   < > 9 9 10    < > = values in this interval not displayed.    Lipids No results for input(s): "CHOL", "TRIG", "HDL", "LABVLDL", "LDLCALC", "CHOLHDL" in the last 168 hours.  Hematology Recent Labs  Lab 01/02/24 1914 01/04/24 0927 01/05/24 0344  WBC 7.1 13.0* 18.0*  RBC 3.90 4.02 3.64*  HGB 11.8* 12.2 11.1*  HCT 38.5 39.1 34.8*  MCV 98.7 97.3 95.6  MCH 30.3 30.3 30.5  MCHC 30.6 31.2 31.9  RDW 14.6 14.4 14.4  PLT 175 193 173   Thyroid  No results for input(s): "TSH", "FREET4" in the last 168 hours.  BNPNo results for input(s): "BNP", "PROBNP" in the last 168 hours.  DDimer No results for input(s): "DDIMER" in the last 168 hours.   Radiology    CT HEAD WO CONTRAST ( ) Result Date: 01/05/2024 CLINICAL DATA:  Altered mental status EXAM: CT HEAD WITHOUT CONTRAST TECHNIQUE: Contiguous axial images were obtained from the base of the skull through the vertex without intravenous contrast. RADIATION DOSE REDUCTION: This exam was performed according to the departmental dose-optimization program which includes automated exposure control,  adjustment of the mA and/or kV according to patient size and/or use of iterative reconstruction technique. COMPARISON:  10/28/2022 FINDINGS: Brain: No mass,hemorrhage or extra-axial collection. Normal appearance of the parenchyma and CSF spaces. Vascular: No hyperdense vessel or unexpected vascular calcification. Skull: The visualized skull base, calvarium and extracranial soft tissues are normal. Sinuses/Orbits: Right maxillary chronic sinusitis.  Normal orbits. Other: None. IMPRESSION: 1. No acute intracranial abnormality. 2. Chronic right maxillary sinusitis. Electronically Signed   By: Juanetta Nordmann M.D.   On: 01/05/2024 01:02   ECHOCARDIOGRAM COMPLETE Result Date: 01/04/2024    ECHOCARDIOGRAM REPORT   Patient Name:   Sue [name] Date of Exam: 01/04/2024 Medical Rec #:  [id number]      Height:       63.0 in Accession #:    [id number]     Weight:       260.0 lb Date of Birth:  [birth year]      BSA:          2.162 m Patient Age:    72 years       BP:           124/80 mmHg Patient Gender: F              HR:           96 bpm. Exam Location:  Inpatient Procedure: 2D Echo, Color Doppler, Cardiac Doppler and Intracardiac            Opacification Agent (Both Spectral and Color Flow Doppler were            utilized during procedure). Indications:    Abnormal ECG, Elevated Troponin  History:        Patient has prior history of Echocardiogram examinations.                 Stroke, Signs/Symptoms:Dyspnea and Chest Pain; Risk                 Factors:Hypertension, Former Smoker and Diabetes.  Sonographer:    Willey Harrier Referring Phys: [id number] [facility]  Sonographer Comments: Patient is obese and Technically difficult  study due to poor echo windows. IMPRESSIONS  1. Left ventricular ejection fraction, by estimation, is 55 to 60%. The left ventricle has normal function. The left ventricle demonstrates regional wall motion abnormalities (see scoring diagram/findings for description). There is mild asymmetric left  ventricular hypertrophy of the basal-septal segment. Left ventricular diastolic parameters are consistent with Grade I diastolic dysfunction (impaired relaxation).  2. Right ventricular systolic function is mildly reduced. The right ventricular size is mildly enlarged.  3. The mitral valve is normal in structure. Trivial mitral valve regurgitation. No evidence of mitral stenosis.  4. The aortic valve is tricuspid. There is mild calcification of the aortic valve. Aortic valve regurgitation is not visualized. No aortic stenosis is present.  5. The inferior vena cava is normal in size with greater than 50% respiratory variability, suggesting right atrial pressure of 3 mmHg. FINDINGS  Left Ventricle: Left ventricular ejection fraction, by estimation, is 55 to 60%. The left ventricle has normal function. The left ventricle demonstrates regional wall motion abnormalities. Definity  contrast agent was given IV to delineate the left ventricular endocardial borders. The left ventricular internal cavity size was normal in size. There is mild asymmetric left ventricular hypertrophy of the basal-septal segment. Left ventricular diastolic parameters are consistent with Grade I diastolic dysfunction (impaired relaxation).  LV Wall Scoring: The mid inferoseptal segment and mid inferior segment are hypokinetic. Right Ventricle: The right ventricular size is mildly enlarged. No increase in right ventricular wall thickness. Right ventricular systolic function is mildly reduced. Left Atrium: Left atrial size was normal in size. Right Atrium: Right atrial size was normal in size. Pericardium: There is no evidence of pericardial effusion. Mitral Valve: The mitral valve is normal in structure. Trivial mitral valve regurgitation. No evidence of mitral valve stenosis. MV peak gradient, 6.9 mmHg. The mean mitral valve gradient is 3.0 mmHg. Tricuspid Valve: The tricuspid valve is not well visualized. Tricuspid valve regurgitation is trivial.  No evidence of tricuspid stenosis. Aortic Valve: The aortic valve is tricuspid. There is mild calcification of the aortic valve. Aortic valve regurgitation is not visualized. No aortic stenosis is present. Aortic valve peak gradient measures 7.2 mmHg. Pulmonic Valve: The pulmonic valve was not well visualized. Pulmonic valve regurgitation is not visualized. No evidence of pulmonic stenosis. Aorta: The aortic root is normal in size and structure. Venous: The inferior vena cava is normal in size with greater than 50% respiratory variability, suggesting right atrial pressure of 3 mmHg. IAS/Shunts: No atrial level shunt detected by color flow Doppler.  LEFT VENTRICLE PLAX 2D LVIDd:         4.70 cm   Diastology LVIDs:         3.80 cm   LV e' medial:    10.00 cm/s LV PW:         0.90 cm   LV E/e' medial:  5.8 LV IVS:        0.90 cm   LV e' lateral:   9.36 cm/s LVOT diam:     2.10 cm   LV E/e' lateral: 6.2 LV SV:         56 LV SV Index:   26 LVOT Area:     3.46 cm  IVC IVC diam: 2.00 cm LEFT ATRIUM             Index        RIGHT ATRIUM          Index LA Vol (A2C):   31.9 ml 14.76 ml/m  RA  Area:     9.59 cm LA Vol (A4C):   23.8 ml 11.01 ml/m  RA Volume:   16.80 ml 7.77 ml/m LA Biplane Vol: 27.9 ml 12.91 ml/m  AORTIC VALVE AV Area (Vmax): 2.19 cm AV Vmax:        134.00 cm/s AV Peak Grad:   7.2 mmHg LVOT Vmax:      84.90 cm/s LVOT Vmean:     61.300 cm/s LVOT VTI:       0.161 m  AORTA Ao Root diam: 3.30 cm Ao Asc diam:  3.30 cm MITRAL VALVE MV Area (PHT): 4.17 cm     SHUNTS MV Area VTI:   2.53 cm     Systemic VTI:  0.16 m MV Peak grad:  6.9 mmHg     Systemic Diam: 2.10 cm MV Mean grad:  3.0 mmHg MV Vmax:       1.31 m/s MV Vmean:      73.8 cm/s MV Decel Time: 182 msec MV E velocity: 58.10 cm/s MV A velocity: 119.00 cm/s MV E/A ratio:  0.49 Jules Oar MD Electronically signed by Jules Oar MD Signature Date/Time: 01/04/2024/2:39:57 PM    Final    DG Chest Port 1 View Result Date: 01/04/2024 CLINICAL DATA:   Dyspnea EXAM: PORTABLE CHEST 1 VIEW COMPARISON:  December 31, 2023 FINDINGS: Mild bilateral reticular interstitial prominence correlate with chronic interstitial lung disease perhaps superimposed mild bilateral congestive changes with basilar hypoventilatory atelectasis without significant pleural effusions No consolidation to indicate pneumonia Heart and mediastinum within normal limits. IMPRESSION: Mild bilateral reticular interstitial prominence correlate with chronic interstitial lung disease perhaps superimposed mild bilateral congestive changes with basilar hypoventilatory atelectasis without significant pleural effusions. Electronically Signed   By: Fredrich Jefferson M.D.   On: 01/04/2024 10:13    Cardiac Studies     Patient Profile     73 y.o. female with a history of chronic diastolic CHF,  hypertension, type 2 diabetes mellitus, TIA in 2016, emphysema with chronic hypoxic respiratory failure on 4 L of O2 at home, hypothyroidism, GERD, and obesity  who is being seen 01/04/2024 for the evaluation of chest pain at the request of Dr. Gwynneth Lessen.    Assessment & Plan    NSTEMI Patient was admitted for rhabdomyolysis after rolling off her bed and getting stuck in between her bed and her night stand. She was on the ground for about 36 hours before her family found her. She started having some epigastric/ chest pain yesterday evening. Initial high-sensitivity troponin on admission on 4/20 was negative x2; however, repeat troponin 4/24 elevated at 236 >> 235. EKG shows no acute ischemic changes. - Patient reported chest pressure with associated dyspnea, nausea, and vomiting (though was also confused yesterday so difficult historian).  Currently denies any chest pain.  She is not a good candidate for any intervention at this time given her AKI.  Echocardiogram shows EF 55 to 60%.  Will plan medical management and can consider outpatient stress PET to evaluate for ischemia - Started on IV Heparin  on 4/24, can  continue for 48 hours  - Continue aspirin  81 mg daily - Continue Toprol -XL 100mg  daily.  - Will hold off on statin for now given rhabdomyolysis    Chronic Diastolic CHF Last Echo in 08/2022 showed LVEF of 55-60% with mild asymmetric LVH of the basal-septal segment. - Euvolemic on exam.  - Home Torsemide  and Jardiance  held on admission in setting of AKI and rhabdomyolysis.  Continue to hold for now.  Palpitations Patient has a history of intermittent palpitations lasting a couple of hours at a time. Prior monitor was ordered in 2022 but it does not look like this was completed.  - Maintain K greater than 4, mag greater than 2 - Continue Toprol -XL 100mg  daily.  - Will place on telemetry. Consider outpatient monitor if telemetry does not show anything.    Hypertension BP well controlled. - Continue Toprol -XL 100mg  daily.    Hyperlipidemia LDL 33 in 10/2023.  - Crestor  held in setting of rhabdomyolysis.    AKI vs CKD Stage IIIb Creatinine 2.06 on admission in setting of rhabdomyolysis. Last know creatinine 1.91 in 10/2023 but previously in 1.6 to 1.7 range in 2024.  Creatinine 2.0 today, and has been restarted on IV fluids   Otherwise, per primary team: - Rhabdomyolysis (CK initially 1,427) - Acute metabolic encephalopathy - Elevated lactic acid  - COPD  - Hypothyroidism - Type 2 diabetes mellitus - GERD - History of CVA  For questions or updates, please contact [facility] Please consult [email or web address] for contact info under        Signed, Wendie Hamburg, MD  01/05/2024, 2:10 PM

## 2024-01-05 NOTE — Progress Notes (Signed)
 PHARMACY - ANTICOAGULATION CONSULT NOTE  Pharmacy Consult for Heparin  Indication: chest pain/ACS  Allergies  Allergen Reactions   Hydrocodone  Other (See Comments)    Constipation and hallucinations   Norvasc  [Amlodipine  Besylate] Swelling and Other (See Comments)    Marked swelling of the limbs   Tizanidine  Other (See Comments)    After the 3rd dose, the patient's mouth began to feel numb and she felt like she was a having a "hot flash"    Patient Measurements: Height: 5\' 3"  (160 cm) Weight: 117.9 kg (260 lb) IBW/kg (Calculated) : 52.4 HEPARIN  DW (KG): 81.2  Vital Signs: Temp: 98.2 F (36.8 C) (04/25 1405) Temp Source: Oral (04/25 1405) BP: 133/74 (04/25 1405) Pulse Rate: 83 (04/25 1405)  Labs: Recent Labs    01/03/24 0338 01/04/24 0927 01/04/24 1103 01/04/24 1320 01/04/24 2317 01/05/24 0344 01/05/24 0917 01/05/24 1812  HGB  --  12.2  --   --   --  11.1*  --   --   HCT  --  39.1  --   --   --  34.8*  --   --   PLT  --  193  --   --   --  173  --   --   HEPARINUNFRC  --   --   --   --  0.19*  --  0.27* 0.42  CREATININE 1.41* 1.53*  --   --   --  2.03*  --   --   CKTOTAL  --   --   --  325*  --   --   --   --   TROPONINIHS  --  236* 235*  --   --   --   --   --     Estimated Creatinine Clearance: 31.5 mL/min (A) (by C-G formula based on SCr of 2.03 mg/dL (H)).   Medical History: Past Medical History:  Diagnosis Date   Allergic rhinitis    Depression    Diabetes mellitus type 2, uncontrolled    Emphysema of lung (HCC)    3L home O2   GERD (gastroesophageal reflux disease)    Hypertension    Hypothyroidism    Obesity, morbid, BMI 50 or higher (HCC)    Stroke (HCC) 2016   TIA    Urine incontinence     Medications:  Scheduled:   acetaminophen   1,000 mg Oral Once   aspirin  EC  81 mg Oral Daily   insulin  aspart  0-15 Units Subcutaneous TID WC   insulin  aspart  0-5 Units Subcutaneous QHS   insulin  glargine-yfgn  45 Units Subcutaneous QHS    levothyroxine   100 mcg Oral QAC breakfast   metoprolol  succinate  100 mg Oral Daily   pantoprazole  (PROTONIX ) IV  40 mg Intravenous Q12H   pregabalin   100 mg Oral QHS   zolpidem   5 mg Oral QHS   Infusions:   sodium chloride  75 mL/hr at 01/05/24 1225   heparin  1,450 Units/hr (01/05/24 1006)    Assessment: 72 yoF admitted on 4/20 with rhabdomyolysis after falling and was on the ground for more than 24 hours.  On 4/23 PM, she complained of chest pain, on 4/24 Pharmacy is consulted to dose Heparin  IV for ACS.  No PTA anticoagulation.  She has been on DVT prophylaxis Lovenox  40mg  - last given on 4/23 at 22:00  Today, 01/05/2024: 09:17 Heparin  level 0.27, subtherapeutic on heparin  1300 units/hr 18:12 Heparin  level 0.42, therapeutic, on heparin  1450 units/hr CBC: Hgb down  to 11.1, Plt WNL No bleeding or complications reported. SCr increased 1.53 > 2.03  Goal of Therapy:  Heparin  level 0.3-0.7 units/ml Monitor platelets by anticoagulation protocol: Yes   Plan:  Continue heparin  IV infusion at 1450 units/hr Confirmatory 8 hr Heparin  level Monitor daily heparin  level, CBC, signs/symptoms of bleeding   Thank you for allowing pharmacy to be a part of this patient's care.  Alfredo Inch, PharmD, BCPS Clinical Pharmacist [facility] 01/05/2024 7:16 PM

## 2024-01-05 NOTE — Progress Notes (Addendum)
 PROGRESS NOTE  Sue [name]  DOB: 03-09-1951  PCP: Estill Hemming, DO WUJ:[id number]  DOA: 12/31/2023  LOS: 5 days  Hospital Day: 6  Brief narrative: Sue [name] is a 73 y.o. female with PMH significant for morbid obesity, DM2, HTN, stroke/TIA, COPD on 3 L oxygen , GERD, hypothyroidism. 4/20, patient was brought to the ED for a fall. Patient had rolled out of bed and fallen between her nightstand and was unable to get up.  She was reportedly on the ground for more than 24 hours until her family found her.    On initial workup, she was noted to have hyperglycemia, hypokalemia, elevated creatinine, elevated lactic acid and elevated CK Skeletal survey was unremarkable for any fracture. Patient started on IV fluid Per patient's daughter, there was concern of excessive pain control usage resulting to intermittent episodes of confusion.  See below for details  Subjective: Patient was seen and examined this morning.  Lying on bed.  On low-flow oxygen .  Daughter at bedside. Continues to complain of intermittent chest pain. Reports diarrhea all night long.  Reports 1 episode of vomiting just few minutes ago.  Daughter at bedside is a Psychologist, sport and exercise and helped clean up.  Labs from this morning with creatinine rising up, WBC count rising up  Assessment and plan: NSTEMI Complain of chest pain since 4/23.   Troponin was elevated and hence cardiology was consulted. Medical management planned with heparin  drip currently at 48 hours Echocardiogram without wall motion abnormality. Recent Labs    01/04/24 0927 01/04/24 1103 01/04/24 1320  CKTOTAL  --   --  325*  TROPONINIHS 236* 235*  --     Acute diarrhea, vomiting Reports diarrhea all night long.  Reports 1 episode of vomiting just few minutes ago.  Daughter at bedside is a Psychologist, sport and exercise and helped clean up.  No fever but WBC count uptrending.  Repeat labs tomorrow.  Also obtain lactic acid again Will avoid Imodium for now. May need  follow-up with WBC count continues to rise up. Recent Labs  Lab 12/31/23 1706 12/31/23 1717 12/31/23 2112 01/01/24 0347 01/02/24 0642 01/04/24 0927 01/05/24 0344  WBC 10.8*  --   --  10.2 7.1 13.0* 18.0*  LATICACIDVEN  --  4.0* 3.1*  --  1.3  --   --    Black vomitus Daughter mentions vomitus was black today.  Hemoglobin slightly dropped from yesterday.  On heparin  drip. Continue to monitor. Obtain Hemoccult test of the emesis. Switch to clear liquid diet Start Protonix  IV twice daily Recent Labs    12/31/23 1706 01/01/24 0347 01/02/24 0642 01/04/24 0927 01/05/24 0344  HGB 13.0 11.5* 11.8* 12.2 11.1*  MCV 96.3 97.6 98.7 97.3 95.6   AKI Creatinine steadily rising.  Likely because of diarrhea loss.  Started on IV fluid this morning. Continue to monitor. Recent Labs    03/03/23 0924 11/07/23 1601 12/31/23 1706 01/01/24 0347 01/02/24 9562 01/03/24 0338 01/04/24 0927 01/05/24 0344  BUN 13 22 17 17 17 15 15 17   CREATININE 1.63* 1.91* 2.06* 1.78* 1.62* 1.41* 1.53* 2.03*   Rhabdomyolysis Because of being on the floor for more than 24-hour duration CK level gradually improved with IV fluid.  Currently not on IV hydration.  Oral hydration encouraged. Recent Labs  Lab 12/31/23 1706 01/01/24 0347 01/02/24 0642 01/04/24 1320  CKTOTAL 1,427* 1,021* 606* 325*    Hypokalemia Potassium level low at 3.3 this morning.  Replacement ordered. Recent Labs  Lab 12/31/23 1706 01/01/24  1610 01/02/24 9604 01/03/24 0338 01/04/24 0927 01/05/24 0344  K 2.9* 3.3* 3.3* 3.0* 3.8 3.3*  MG 1.9  --   --   --   --   --    Acute metabolic encephalopathy  chronic pain Insomnia Mental status better today. PTA meds-  Lyrica  3 times daily, Seroquel  nightly, Ambien  nightly  Ativan  scheduled and as needed, oxycodone  as needed, Currently continued on Ambien  and Lyrica  nightly.  Continue to hold orders.   Type 2 diabetes mellitus A1c 10.3 on 11/07/2023 PTA meds-Trulicity , Jardiance ,  Lantus  74 units daily, sliding scale insulin  Currently blood sugar level is adequately controlled on Semglee  45 units nightly and SSI/Accu-Cheks Recent Labs  Lab 01/04/24 1149 01/04/24 1624 01/04/24 2115 01/05/24 0729 01/05/24 1144  GLUCAP 113* 99 173* 98 99   Hypertension Continue metoprolol  On hold torsemide   Prior CVA  HLD Statin on hold because of rhabdomyolysis  COPD Chronic hypoxic respiratory failure On 3 L oxygen  at home. Continue bronchodilators   Hypothyroidism Continue Synthroid    Mobility: Encourage ambulation.  PT eval obtained.  SNF recommended  Goals of care   Code Status: Full Code     DVT prophylaxis:  SCDs Start: 12/31/23 1955   Antimicrobials: None Fluid: Not on IV fluid Consultants: None Family Communication: Daughter at bedside.  Status: Inpatient Level of care:  Progressive   Patient is from: Home Needs to continue in-hospital care: Has new diarrhea, vomiting, AKI, leukocytosis  Anticipated d/c to: Not stable for discharge    Diet:  Diet Order             Diet clear liquid Room service appropriate? Yes; Fluid consistency: Thin  Diet effective now                   Scheduled Meds:  acetaminophen   1,000 mg Oral Once   aspirin  EC  81 mg Oral Daily   insulin  aspart  0-15 Units Subcutaneous TID WC   insulin  aspart  0-5 Units Subcutaneous QHS   insulin  glargine-yfgn  45 Units Subcutaneous QHS   levothyroxine   100 mcg Oral QAC breakfast   metoprolol  succinate  100 mg Oral Daily   pantoprazole  (PROTONIX ) IV  40 mg Intravenous Q12H   pregabalin   100 mg Oral QHS   zolpidem   5 mg Oral QHS    PRN meds: acetaminophen  **OR** acetaminophen , albuterol , allopurinol , alum & mag hydroxide-simeth, methocarbamol  (ROBAXIN ) injection, ondansetron  **OR** ondansetron  (ZOFRAN ) IV, oxyCODONE    Infusions:   sodium chloride  75 mL/hr at 01/05/24 1225   heparin  1,450 Units/hr (01/05/24 1006)     Antimicrobials: Anti-infectives (From  admission, onward)    None       Objective: Vitals:   01/05/24 1339 01/05/24 1405  BP:  133/74  Pulse: 82 83  Resp: 18 18  Temp:  98.2 F (36.8 C)  SpO2: 94% 99%    Intake/Output Summary (Last 24 hours) at 01/05/2024 1431 Last data filed at 01/05/2024 0553 Gross per 24 hour  Intake 469.27 ml  Output --  Net 469.27 ml   Filed Weights   12/31/23 1550  Weight: 117.9 kg   Weight change:  Body mass index is 46.06 kg/m.   Physical Exam: General exam: Pleasant, elderly African-American female.  Not in distress Skin: No rashes, lesions or ulcers. HEENT: Atraumatic, normocephalic, no obvious bleeding Lungs: Clear to auscultation bilaterally,  CVS: S1, S2, no murmur,   GI/Abd: Soft, nontender, nondistended, bowel sound present,   CNS: Alert, awake, speech clear.  Slow  to respond but oriented x 3  psychiatry: Sad affect Extremities: No pedal edema, no calf tenderness,   Data Review: I have personally reviewed the laboratory data and studies available.  F/u labs ordered Unresulted Labs (From admission, onward)     Start     Ordered   01/06/24 0500  Basic metabolic panel with GFR  Daily,   R      01/05/24 1429   01/06/24 0500  Lactic acid, plasma  (Lactic Acid)  Tomorrow morning,   R        01/05/24 1429   01/05/24 1830  Heparin  level (unfractionated)  Once-Timed,   TIMED        01/05/24 1005   01/05/24 0500  CBC  Daily,   R      01/04/24 1519   Unscheduled  Occult blood card to lab, stool  As needed,   R     Comments: FOBT of the vomitus    01/05/24 1429            Total time spent in review of labs and imaging, patient evaluation, formulation of plan, documentation and communication with family: 55 minutes  Signed, Hoyt Macleod, MD [facility] 01/05/2024

## 2024-01-05 NOTE — Progress Notes (Signed)
 PHARMACY - ANTICOAGULATION CONSULT NOTE  Pharmacy Consult for Heparin  Indication: chest pain/ACS  Allergies  Allergen Reactions   Hydrocodone  Other (See Comments)    Constipation and hallucinations   Norvasc  [Amlodipine  Besylate] Swelling and Other (See Comments)    Marked swelling of the limbs   Tizanidine  Other (See Comments)    After the 3rd dose, the patient's mouth began to feel numb and she felt like she was a having a "hot flash"    Patient Measurements: Height: 5\' 3"  (160 cm) Weight: 117.9 kg (260 lb) IBW/kg (Calculated) : 52.4 HEPARIN  DW (KG): 81.2  Vital Signs: Temp: 99.6 F (37.6 C) (04/24 2336) Temp Source: Oral (04/24 2336) BP: 97/57 (04/24 2336) Pulse Rate: 88 (04/24 2336)  Labs: Recent Labs    01/02/24 1610 01/03/24 0338 01/04/24 0927 01/04/24 1103 01/04/24 1320 01/04/24 2317  HGB 11.8*  --  12.2  --   --   --   HCT 38.5  --  39.1  --   --   --   PLT 175  --  193  --   --   --   HEPARINUNFRC  --   --   --   --   --  0.19*  CREATININE 1.62* 1.41* 1.53*  --   --   --   CKTOTAL 606*  --   --   --  325*  --   TROPONINIHS  --   --  236* 235*  --   --     Estimated Creatinine Clearance: 41.8 mL/min (A) (by C-G formula based on SCr of 1.53 mg/dL (H)).   Medical History: Past Medical History:  Diagnosis Date   Allergic rhinitis    Depression    Diabetes mellitus type 2, uncontrolled    Emphysema of lung (HCC)    3L home O2   GERD (gastroesophageal reflux disease)    Hypertension    Hypothyroidism    Obesity, morbid, BMI 50 or higher (HCC)    Stroke (HCC) 2016   TIA    Urine incontinence     Medications:  Scheduled:   acetaminophen   1,000 mg Oral Once   aspirin  EC  81 mg Oral Daily   insulin  aspart  0-15 Units Subcutaneous TID WC   insulin  aspart  0-5 Units Subcutaneous QHS   insulin  glargine-yfgn  45 Units Subcutaneous QHS   levothyroxine   100 mcg Oral QAC breakfast   metoprolol  succinate  100 mg Oral Daily   pregabalin   100 mg Oral  QHS   pregabalin   50 mg Oral BID   zolpidem   5 mg Oral QHS   Infusions:   heparin  1,000 Units/hr (01/04/24 1512)    Assessment: 4 yoF admitted on 4/20 with rhabdomyolysis after falling and was on the ground for more than 24 hours.  On 4/23 PM, she complained of chest pain, on 4/24 Pharmacy is consulted to dose Heparin  IV for ACS.  No PTA anticoagulation.  She has been on DVT prophylaxis Lovenox  40mg  - last given on 4/23 at 22:00 SCr 1.53 Trop: 236, 235 CBC: Hgb and Plt WNL  01/05/2024: Initial heparin  level 0.19- sub-therapeutic on IV heparin  1000 units/hr No bleeding or infusion related concerns reported by RN  Goal of Therapy:  Heparin  level 0.3-0.7 units/ml Monitor platelets by anticoagulation protocol: Yes   Plan:  Re-bolus heparin  2000 units bolus IV x 1 Increase heparin  IV infusion to 1300 units/hr Recheck heparin  level 8 hours after rate increase Daily heparin  level and CBC while  on heparin  F/U plans for cardiac cath  Arie Kurtz, PharmD, BCPS 01/05/2024 1:10 AM

## 2024-01-06 ENCOUNTER — Inpatient Hospital Stay (HOSPITAL_COMMUNITY)

## 2024-01-06 DIAGNOSIS — M6282 Rhabdomyolysis: Secondary | ICD-10-CM | POA: Diagnosis not present

## 2024-01-06 DIAGNOSIS — I10 Essential (primary) hypertension: Secondary | ICD-10-CM

## 2024-01-06 DIAGNOSIS — I214 Non-ST elevation (NSTEMI) myocardial infarction: Secondary | ICD-10-CM | POA: Diagnosis not present

## 2024-01-06 DIAGNOSIS — R002 Palpitations: Secondary | ICD-10-CM | POA: Diagnosis not present

## 2024-01-06 DIAGNOSIS — E782 Mixed hyperlipidemia: Secondary | ICD-10-CM

## 2024-01-06 DIAGNOSIS — I5032 Chronic diastolic (congestive) heart failure: Secondary | ICD-10-CM | POA: Diagnosis not present

## 2024-01-06 LAB — URINALYSIS, ROUTINE W REFLEX MICROSCOPIC
Bilirubin Urine: NEGATIVE
Glucose, UA: NEGATIVE mg/dL
Ketones, ur: 5 mg/dL — AB
Nitrite: NEGATIVE
Protein, ur: 30 mg/dL — AB
Specific Gravity, Urine: 1.019 (ref 1.005–1.030)
WBC, UA: >50 WBC/hpf (ref 0–5)
pH: 5 (ref 5.0–8.0)

## 2024-01-06 LAB — CBC
HCT: 35.7 % — ABNORMAL LOW (ref 36.0–46.0)
Hemoglobin: 11.1 g/dL — ABNORMAL LOW (ref 12.0–15.0)
MCH: 30.3 pg (ref 26.0–34.0)
MCHC: 31.1 g/dL (ref 30.0–36.0)
MCV: 97.5 fL (ref 80.0–100.0)
Platelets: 156 10*3/uL (ref 150–400)
RBC: 3.66 MIL/uL — ABNORMAL LOW (ref 3.87–5.11)
RDW: 14.4 % (ref 11.5–15.5)
WBC: 13.2 10*3/uL — ABNORMAL HIGH (ref 4.0–10.5)
nRBC: 0.2 % (ref 0.0–0.2)

## 2024-01-06 LAB — BLOOD CULTURE ID PANEL (REFLEXED) - BCID2

## 2024-01-06 LAB — BASIC METABOLIC PANEL WITH GFR
Anion gap: 9 (ref 5–15)
BUN: 19 mg/dL (ref 8–23)
CO2: 27 mmol/L (ref 22–32)
Calcium: 8.4 mg/dL — ABNORMAL LOW (ref 8.9–10.3)
Chloride: 101 mmol/L (ref 98–111)
Creatinine, Ser: 1.68 mg/dL — ABNORMAL HIGH (ref 0.44–1.00)
GFR, Estimated: 32 mL/min — ABNORMAL LOW (ref >60)
Glucose, Bld: 84 mg/dL (ref 70–99)
Potassium: 3.4 mmol/L — ABNORMAL LOW (ref 3.5–5.1)
Sodium: 137 mmol/L (ref 135–145)

## 2024-01-06 LAB — LACTIC ACID, PLASMA
Lactic Acid, Venous: 1.2 mmol/L (ref 0.5–1.9)
Lactic Acid, Venous: 0.9 mmol/L (ref 0.5–1.9)

## 2024-01-06 LAB — GLUCOSE, CAPILLARY
Glucose-Capillary: 82 mg/dL (ref 70–99)
Glucose-Capillary: 105 mg/dL — ABNORMAL HIGH (ref 70–99)
Glucose-Capillary: 152 mg/dL — ABNORMAL HIGH (ref 70–99)
Glucose-Capillary: 139 mg/dL — ABNORMAL HIGH (ref 70–99)

## 2024-01-06 LAB — HEPARIN LEVEL (UNFRACTIONATED): Heparin Unfractionated: 0.38 [IU]/mL (ref 0.30–0.70)

## 2024-01-06 MED ORDER — SODIUM CHLORIDE 0.9 % IV SOLN: 2.0000 g | Freq: Once | INTRAVENOUS | Status: DC

## 2024-01-06 MED ORDER — SODIUM CHLORIDE 0.9 % IV SOLN
2.0000 g | INTRAVENOUS | Status: DC
  Administered 2024-01-06: 2 g via INTRAVENOUS
  Filled 2024-01-06: qty 20

## 2024-01-06 MED ORDER — SODIUM CHLORIDE 0.9 % IV SOLN
2.0000 g | Freq: Two times a day (BID) | INTRAVENOUS | Status: DC
  Administered 2024-01-06 – 2024-01-07 (×3): 2 g via INTRAVENOUS
  Filled 2024-01-06 (×3): qty 12.5

## 2024-01-06 MED ORDER — SODIUM CHLORIDE 0.9 % IV SOLN
2.0000 g | Freq: Once | INTRAVENOUS | Status: AC
  Administered 2024-01-06: 2 g via INTRAVENOUS
  Filled 2024-01-06: qty 12.5

## 2024-01-06 MED ORDER — PANTOPRAZOLE SODIUM 40 MG PO TBEC
40.0000 mg | DELAYED_RELEASE_TABLET | Freq: Two times a day (BID) | ORAL | Status: DC
  Administered 2024-01-06 – 2024-01-08 (×4): 40 mg via ORAL
  Filled 2024-01-06 (×4): qty 1

## 2024-01-06 MED ORDER — ENOXAPARIN SODIUM 60 MG/0.6ML IJ SOSY
60.0000 mg | PREFILLED_SYRINGE | Freq: Every day | INTRAMUSCULAR | Status: DC
  Administered 2024-01-07 – 2024-01-08 (×2): 60 mg via SUBCUTANEOUS
  Filled 2024-01-06 (×2): qty 0.6

## 2024-01-06 NOTE — Progress Notes (Signed)
 PROGRESS NOTE  Sue [name]  DOB: 1951-02-09  PCP: Estill Hemming, DO ZOX:[id number]  DOA: 12/31/2023  LOS: 6 days  Hospital Day: 7  Brief narrative: Sue [name] is a 73 y.o. female with PMH significant for morbid obesity, DM2, HTN, stroke/TIA, COPD on 3 L oxygen , GERD, hypothyroidism. 4/20, patient was brought to the ED for a fall. Patient had rolled out of bed and fallen between her nightstand and was unable to get up.  She was reportedly on the ground for more than 24 hours until her family found her.    On initial workup, she was noted to have hyperglycemia, hypokalemia, elevated creatinine, elevated lactic acid and elevated CK Skeletal survey was unremarkable for any fracture. Patient started on IV fluid Per patient's daughter, there was concern of excessive pain control usage resulting to intermittent episodes of confusion.  See below for details  Subjective: Patient was seen and examined this morning.  Lying down.Aaron Aas  No chest pain. Reports 1 more episode of vomiting yesterday but no diarrhea. No family at bedside. Had a fever of 101.2 last night.  Blood culture sent.  Chest x-ray without infiltrates. Repeat labs from this morning with improving creatinine to 1.68, improving WBC to 13.2  Assessment and plan: NSTEMI Complain of chest pain since 4/23.   Troponin was elevated and hence cardiology was consulted. Medical management done with heparin  drip for 48 hours Echocardiogram without wall motion abnormality. No intervention planned per cardiology.  No chest pain currently.   Sepsis - not POA Patient had a fever of 101.2 last night.  In the preceding 24 hours, patient had 2 episode of vomiting and diarrhea.  Blood culture sent.  Urinalysis sent.  Chest x-ray without infiltrates.   Given 1 dose of IV cefepime  last night.  Will continue empiric antibiotic with IV Rocephin . WBC count improving today.  Lactic acid normal  Continue monitor abdominal symptoms and  temperature  Recent Labs  Lab 12/31/23 1717 12/31/23 2112 01/01/24 0347 01/02/24 0642 01/04/24 0927 01/05/24 0344 01/06/24 0046 01/06/24 0412  WBC  --   --  10.2 7.1 13.0* 18.0*  --  13.2*  LATICACIDVEN 4.0* 3.1*  --  1.3  --   --  1.2 0.9   Black vomitus Daughter mentioned vomitus was black yesterday.  Patient was on heparin  drip but she did not have any gross bleeding.  Diet was switched to clear liquid. Heparin  drip stopped today Hemoglobin remained stable. Will resume regular consistency diet. Recent Labs    01/01/24 0347 01/02/24 0642 01/04/24 0927 01/05/24 0344 01/06/24 0412  HGB 11.5* 11.8* 12.2 11.1* 11.1*  MCV 97.6 98.7 97.3 95.6 97.5   AKI Creatinine improving with IV fluid.  Continue IV hydration for next 24 hours. Recent Labs    03/03/23 0924 11/07/23 1601 12/31/23 1706 01/01/24 0347 01/02/24 8119 01/03/24 0338 01/04/24 0927 01/05/24 0344 01/06/24 0412  BUN 13 22 17 17 17 15 15 17 19   CREATININE 1.63* 1.91* 2.06* 1.78* 1.62* 1.41* 1.53* 2.03* 1.68*   Rhabdomyolysis Because of being on the floor for more than 24-hour duration CK level gradually improved with IV fluid.  Recent Labs  Lab 12/31/23 1706 01/01/24 0347 01/02/24 0642 01/04/24 1320  CKTOTAL 1,427* 1,021* 606* 325*    Hypokalemia Potassium level low at 3.4 this morning.  Replacement ordered. Recent Labs  Lab 12/31/23 1706 01/01/24 0347 01/02/24 1478 01/03/24 0338 01/04/24 0927 01/05/24 0344 01/06/24 0412  K 2.9*   < > 3.3* 3.0*  3.8 3.3* 3.4*  MG 1.9  --   --   --   --   --   --    < > = values in this interval not displayed.   Acute metabolic encephalopathy  chronic pain Insomnia Mental status better today. PTA meds-  Lyrica  3 times daily, Seroquel  nightly, Ambien  nightly  Ativan  scheduled and as needed, oxycodone  as needed, Currently continued on Ambien  and Lyrica  nightly.  Continue to hold orders.   Type 2 diabetes mellitus A1c 10.3 on 11/07/2023 PTA meds-Trulicity ,  Jardiance , Lantus  74 units daily, sliding scale insulin  Currently blood sugar level is adequately controlled on Semglee  45 units nightly and SSI/Accu-Cheks Recent Labs  Lab 01/05/24 0729 01/05/24 1144 01/05/24 1716 01/05/24 2111 01/06/24 0757  GLUCAP 98 99 124* 101* 82   Hypertension Continue metoprolol  On hold torsemide   Prior CVA  HLD Statin on hold because of rhabdomyolysis  COPD Chronic hypoxic respiratory failure On 3 L oxygen  at home. Continue bronchodilators   Hypothyroidism Continue Synthroid    Mobility: Encourage ambulation.  PT eval obtained.  SNF recommended  Goals of care   Code Status: Full Code     DVT prophylaxis:  SCDs Start: 12/31/23 1955   Antimicrobials: None Fluid: NS at 75 mL/h Consultants: None Family Communication: Daughter not at bedside today  Status: Inpatient Level of care:  Progressive   Patient is from: Home Needs to continue in-hospital care: Monitor abdominal symptoms, new fever  Anticipated d/c to: Not stable for discharge.  Hopefully stable in next 1 to 2 days    Diet:  Diet Order             Diet Carb Modified Fluid consistency: Thin; Room service appropriate? Yes  Diet effective now                   Scheduled Meds:  acetaminophen   1,000 mg Oral Once   aspirin  EC  81 mg Oral Daily   insulin  aspart  0-15 Units Subcutaneous TID WC   insulin  aspart  0-5 Units Subcutaneous QHS   insulin  glargine-yfgn  45 Units Subcutaneous QHS   levothyroxine   100 mcg Oral QAC breakfast   metoprolol  succinate  100 mg Oral Daily   pantoprazole  (PROTONIX ) IV  40 mg Intravenous Q12H   pregabalin   100 mg Oral QHS   zolpidem   5 mg Oral QHS    PRN meds: acetaminophen  **OR** acetaminophen , albuterol , allopurinol , alum & mag hydroxide-simeth, methocarbamol  (ROBAXIN ) injection, ondansetron  **OR** ondansetron  (ZOFRAN ) IV, oxyCODONE    Infusions:   sodium chloride  75 mL/hr at 01/06/24 1101   cefTRIAXone  (ROCEPHIN )  IV        Antimicrobials: Anti-infectives (From admission, onward)    Start     Dose/Rate Route Frequency Ordered Stop   01/06/24 1215  cefTRIAXone  (ROCEPHIN ) 1 g in sodium chloride  0.9 % 100 mL IVPB        1 g 200 mL/hr over 30 Minutes Intravenous Every 24 hours 01/06/24 1119     01/06/24 0345  ceFEPIme  (MAXIPIME ) 2 g in sodium chloride  0.9 % 100 mL IVPB        2 g 200 mL/hr over 30 Minutes Intravenous  Once 01/06/24 0254 01/06/24 0700       Objective: Vitals:   01/05/24 2341 01/06/24 0511  BP:  (!) 140/71  Pulse:  82  Resp:  20  Temp: 99.1 F (37.3 C) 98.7 F (37.1 C)  SpO2:  100%    Intake/Output Summary (Last 24 hours) at  01/06/2024 1121 Last data filed at 01/06/2024 1101 Gross per 24 hour  Intake 2171.57 ml  Output --  Net 2171.57 ml   Filed Weights   12/31/23 1550  Weight: 117.9 kg   Weight change:  Body mass index is 46.06 kg/m.   Physical Exam: General exam: Pleasant, elderly African-American female.  Not in distress.  Weak Skin: No rashes, lesions or ulcers. HEENT: Atraumatic, normocephalic, no obvious bleeding Lungs: Clear to auscultation bilaterally,  CVS: S1, S2, no murmur,   GI/Abd: Soft, no tenderness, nondistended, bowel sound present,   CNS: Alert, awake, speech clear.  Slow to respond but oriented x 3  psychiatry: Sad affect Extremities: No pedal edema, no calf tenderness,   Data Review: I have personally reviewed the laboratory data and studies available.  F/u labs ordered Unresulted Labs (From admission, onward)     Start     Ordered   01/07/24 0500  Heparin  level (unfractionated)  Daily,   R      01/05/24 1942   01/06/24 0500  Basic metabolic panel with GFR  Daily,   R      01/05/24 1429   01/06/24 0009  Urinalysis, Routine w reflex microscopic -Urine, Clean Catch  Once,   R       Question:  Specimen Source  Answer:  Urine, Clean Catch   01/06/24 0009   01/05/24 0500  CBC  Daily,   R      01/04/24 1519   Unscheduled  Occult blood card  to lab, stool  As needed,   R     Comments: FOBT of the vomitus    01/05/24 1429            Total time spent in review of labs and imaging, patient evaluation, formulation of plan, documentation and communication with family: 45 minutes  Signed, Hoyt Macleod, MD [facility] 01/06/2024

## 2024-01-06 NOTE — Progress Notes (Signed)
 PHARMACY - PHYSICIAN COMMUNICATION CRITICAL VALUE ALERT - BLOOD CULTURE IDENTIFICATION (BCID)  Sue [name] is an 73 y.o. female who presented to [facility] on 12/31/2023 with a chief complaint of fall with prolonged time on floor  Assessment:  2/2 bottles (only aerobic bottle drawn for each "set" ) = GNR + Enterobacter clocae complex, no resistance detected  Name of physician (or Provider) Contacted: Dr Gwynneth Lessen sent secure chat  Current antibiotics: ceftriaxone  2 gm q24  Changes to prescribed antibiotics recommended:  Change to cefepime  2 gm IV q12 hrs  Results for orders placed or performed during the hospital encounter of 12/31/23  Blood Culture ID Panel (Reflexed) (Collected: 01/06/2024 12:45 AM)  Result Value Ref Range   Enterococcus faecalis NOT DETECTED NOT DETECTED   Enterococcus Faecium NOT DETECTED NOT DETECTED   Listeria monocytogenes NOT DETECTED NOT DETECTED   Staphylococcus species NOT DETECTED NOT DETECTED   Staphylococcus aureus (BCID) NOT DETECTED NOT DETECTED   Staphylococcus epidermidis NOT DETECTED NOT DETECTED   Staphylococcus lugdunensis NOT DETECTED NOT DETECTED   Streptococcus species NOT DETECTED NOT DETECTED   Streptococcus agalactiae NOT DETECTED NOT DETECTED   Streptococcus pneumoniae NOT DETECTED NOT DETECTED   Streptococcus pyogenes NOT DETECTED NOT DETECTED   A.calcoaceticus-baumannii NOT DETECTED NOT DETECTED   Bacteroides fragilis NOT DETECTED NOT DETECTED   Enterobacterales DETECTED (A) NOT DETECTED   Enterobacter cloacae complex DETECTED (A) NOT DETECTED   Escherichia coli NOT DETECTED NOT DETECTED   Klebsiella aerogenes NOT DETECTED NOT DETECTED   Klebsiella oxytoca NOT DETECTED NOT DETECTED   Klebsiella pneumoniae NOT DETECTED NOT DETECTED   Proteus species NOT DETECTED NOT DETECTED   Salmonella species NOT DETECTED NOT DETECTED   Serratia marcescens NOT DETECTED NOT DETECTED   Haemophilus influenzae NOT DETECTED NOT DETECTED   Neisseria  meningitidis NOT DETECTED NOT DETECTED   Pseudomonas aeruginosa NOT DETECTED NOT DETECTED   Stenotrophomonas maltophilia NOT DETECTED NOT DETECTED   Candida albicans NOT DETECTED NOT DETECTED   Candida auris NOT DETECTED NOT DETECTED   Candida glabrata NOT DETECTED NOT DETECTED   Candida krusei NOT DETECTED NOT DETECTED   Candida parapsilosis NOT DETECTED NOT DETECTED   Candida tropicalis NOT DETECTED NOT DETECTED   Cryptococcus neoformans/gattii NOT DETECTED NOT DETECTED   CTX-M ESBL NOT DETECTED NOT DETECTED   Carbapenem resistance IMP NOT DETECTED NOT DETECTED   Carbapenem resistance KPC NOT DETECTED NOT DETECTED   Carbapenem resistance NDM NOT DETECTED NOT DETECTED   Carbapenem resist OXA 48 LIKE NOT DETECTED NOT DETECTED   Carbapenem resistance VIM NOT DETECTED NOT DETECTED    Rubie Corona, Pharm.D Use secure chat for questions 01/06/2024 7:04 PM

## 2024-01-06 NOTE — Progress Notes (Signed)
     Patient Name: Sue [name]           DOB: 1950-11-20  MRN: [id number]      Admission Date: 12/31/2023  Attending Provider: Hoyt Macleod, MD  Primary Diagnosis: Rhabdomyolysis   Level of care: Progressive   OVERNIGHT PROGRESS REPORT  New onset fever, 101.2 F. All other vital stable.  No acute changes at this time.  WBC uptrending, 18. Lactic acid negative. Patient reported new diarrhea (last night ) and vomiting x1 (day time).  No further episodes reported.  Endorsed chills while having fever. No other complaints.  Patient denies cough, shortness of breath, sneezing or congestion. Denies urinary retention, dysuria, frequency. Denies abdominal discomfort, or further episodes of diarrhea, emesis.   Plan: Blood cultures, UA, chest x-ray  LA, CBC Tylenol  Rocephin  x1    Margarethe Virgen, DNP, ACNPC- AG [facility] [place]

## 2024-01-06 NOTE — Progress Notes (Signed)
 Physical Therapy Treatment Patient Details Name: Sue [name] MRN: [id number] DOB: 08/18/1951 Today's Date: 01/06/2024   History of Present Illness 73 y.o. female  Patient had rolled out of bed and fallen between her nightstand and was unable to get up. She reported being on the ground for about 24 hours. admitted with  Rhabdomyolysis. PMH: T2D, depression, hypothyroidism, prior stroke, morbid obesity, HTN, COPD    PT Comments  Pt on phone when PT arrives, agreeable to session and would like to get to the chair and attempt amb if she feels able. Pt able to come to sit EOB with HOB at 60degrees, uses HR at sup A to short sit. STS at [facility] from bed and sup A at recliner STS. Pt CGA to side step towards recliner, appears fatigued and I feel that progressing mobility away from chair/bed unsafe this day, deferred. Pt able to repeat STS and weight shift/march in place, again fatiguing. Pt elects to sit up for a bit, has callbell and tray table with personal items.     If plan is discharge home, recommend the following: A lot of help with walking and/or transfers;A lot of help with bathing/dressing/bathroom;Assistance with cooking/housework;Assist for transportation;Help with stairs or ramp for entrance   Can travel by private vehicle     No  Equipment Recommendations  Rolling walker (2 wheels)    Recommendations for Other Services       Precautions / Restrictions Precautions Precautions: Fall Recall of Precautions/Restrictions: Intact Precaution/Restrictions Comments: Oxygen  dep 3 lts Restrictions Weight Bearing Restrictions Per Provider Order: No     Mobility  Bed Mobility Overal bed mobility: Needs Assistance Bed Mobility: Supine to Sit     Supine to sit: HOB elevated, Used rails, Supervision     General bed mobility comments: HOB 60 degrees, uses HR able to complete without physical assist    Transfers Overall transfer level: Needs assistance Equipment used: Rolling walker (2  wheels) Transfers: Sit to/from Stand Sit to Stand: Contact guard assist   Step pivot transfers: Contact guard assist       General transfer comment: appears fatigued following transfer, defer mobility away from chair/bed until tolerance improves    Ambulation/Gait                   Stairs             Wheelchair Mobility     Tilt Bed    Modified Rankin (Stroke Patients Only)       Balance Overall balance assessment: Needs assistance Sitting-balance support: No upper extremity supported, Feet supported Sitting balance-Leahy Scale: Fair     Standing balance support: During functional activity, Bilateral upper extremity supported Standing balance-Leahy Scale: Fair                              Communication    Cognition Arousal: Alert Behavior During Therapy: WFL for tasks assessed/performed   PT - Cognitive impairments: No apparent impairments                                Cueing    Exercises      General Comments        Pertinent Vitals/Pain Pain Assessment Pain Assessment: No/denies pain Pain Intervention(s): Monitored during session    Home Living Family/patient expects to be discharged to:: Unsure Living Arrangements: Alone Available Help at Discharge:  Family;Available PRN/intermittently Type of Home: Apartment Home Access: Level entry       Home Layout: One level Home Equipment: Rollator (4 wheels);Grab bars - tub/shower;Hand held shower head;Shower seat;Other (comment) Additional Comments: O2 dependent at baseline    Prior Function            PT Goals (current goals can now be found in the care plan section) Acute Rehab PT Goals Patient Stated Goal: improve activity tolerance PT Goal Formulation: With patient Time For Goal Achievement: 01/15/24 Potential to Achieve Goals: Good    Frequency    Min 3X/week      PT Plan      Co-evaluation              AM-PAC PT "6 Clicks"  Mobility   Outcome Measure  Help needed turning from your back to your side while in a flat bed without using bedrails?: A Little Help needed moving from lying on your back to sitting on the side of a flat bed without using bedrails?: A Lot Help needed moving to and from a bed to a chair (including a wheelchair)?: A Little Help needed standing up from a chair using your arms (e.g., wheelchair or bedside chair)?: A Little Help needed to walk in hospital room?: A Lot Help needed climbing 3-5 steps with a railing? : Total 6 Click Score: 14    End of Session Equipment Utilized During Treatment: Gait belt;Oxygen  Activity Tolerance: Patient limited by fatigue Patient left: in chair;with call bell/phone within reach;with chair alarm set Nurse Communication: Mobility status PT Visit Diagnosis: Other abnormalities of gait and mobility (R26.89)     Time: 1201-1229 PT Time Calculation (min) (ACUTE ONLY): 28 min  Charges:    $Therapeutic Activity: 23-37 mins PT General Charges $$ ACUTE PT VISIT: 1 Visit                     Darien Eden PT Acute Rehabilitation Services Office: [phone] 01/06/2024    Serafin Dames 01/06/2024, 12:45 PM

## 2024-01-06 NOTE — Progress Notes (Signed)
 PHARMACY - ANTICOAGULATION CONSULT NOTE  Pharmacy Consult for Heparin  Indication: chest pain/ACS  Allergies  Allergen Reactions   Hydrocodone  Other (See Comments)    Constipation and hallucinations   Norvasc  [Amlodipine  Besylate] Swelling and Other (See Comments)    Marked swelling of the limbs   Tizanidine  Other (See Comments)    After the 3rd dose, the patient's mouth began to feel numb and she felt like she was a having a "hot flash"    Patient Measurements: Height: 5\' 3"  (160 cm) Weight: 117.9 kg (260 lb) IBW/kg (Calculated) : 52.4 HEPARIN  DW (KG): 81.2  Vital Signs: Temp: 99.1 F (37.3 C) (04/25 2341) Temp Source: Oral (04/25 2341) BP: 119/77 (04/25 2107) Pulse Rate: 100 (04/25 2107)  Labs: Recent Labs    01/04/24 0927 01/04/24 1103 01/04/24 1320 01/04/24 2317 01/05/24 0344 01/05/24 0917 01/05/24 1812 01/06/24 0412  HGB 12.2  --   --   --  11.1*  --   --  11.1*  HCT 39.1  --   --   --  34.8*  --   --  35.7*  PLT 193  --   --   --  173  --   --  156  HEPARINUNFRC  --   --   --    < >  --  0.27* 0.42 0.38  CREATININE 1.53*  --   --   --  2.03*  --   --  1.68*  CKTOTAL  --   --  325*  --   --   --   --   --   TROPONINIHS 236* 235*  --   --   --   --   --   --    < > = values in this interval not displayed.    Estimated Creatinine Clearance: 38.1 mL/min (A) (by C-G formula based on SCr of 1.68 mg/dL (H)).   Medical History: Past Medical History:  Diagnosis Date   Allergic rhinitis    Depression    Diabetes mellitus type 2, uncontrolled    Emphysema of lung (HCC)    3L home O2   GERD (gastroesophageal reflux disease)    Hypertension    Hypothyroidism    Obesity, morbid, BMI 50 or higher (HCC)    Stroke (HCC) 2016   TIA    Urine incontinence     Medications:  Scheduled:   acetaminophen   1,000 mg Oral Once   aspirin  EC  81 mg Oral Daily   insulin  aspart  0-15 Units Subcutaneous TID WC   insulin  aspart  0-5 Units Subcutaneous QHS   insulin   glargine-yfgn  45 Units Subcutaneous QHS   levothyroxine   100 mcg Oral QAC breakfast   metoprolol  succinate  100 mg Oral Daily   pantoprazole  (PROTONIX ) IV  40 mg Intravenous Q12H   pregabalin   100 mg Oral QHS   zolpidem   5 mg Oral QHS   Infusions:   sodium chloride  75 mL/hr at 01/05/24 2353   ceFEPime  (MAXIPIME ) IV     heparin  1,450 Units/hr (01/06/24 0309)    Assessment: 74 yoF admitted on 4/20 with rhabdomyolysis after falling and was on the ground for more than 24 hours.  On 4/23 PM, she complained of chest pain, on 4/24 Pharmacy is consulted to dose Heparin  IV for ACS.  No PTA anticoagulation.  She has been on DVT prophylaxis Lovenox  40mg  - last given on 4/23 at 22:00  Today, 01/06/2024: Heparin  level 0.38, remains therapeutic on heparin  1450  units/hr CBC: Hgb slightly low but stable at 11.1, Plt WNL No bleeding or infusion related complications reported by RN SCr trending back down  Goal of Therapy:  Heparin  level 0.3-0.7 units/ml Monitor platelets by anticoagulation protocol: Yes   Plan:  Continue heparin  IV infusion at 1450 units/hr Monitor daily heparin  level, CBC, signs/symptoms of bleeding   Thank you for allowing pharmacy to be a part of this patient's care.  Arie Kurtz, PharmD, BCPS 01/06/2024 4:52 AM

## 2024-01-06 NOTE — Plan of Care (Signed)
  Problem: Education: Goal: Knowledge of General Education information will improve Description: Including pain rating scale, medication(s)/side effects and non-pharmacologic comfort measures Outcome: Progressing   Problem: Health Behavior/Discharge Planning: Goal: Ability to manage health-related needs will improve Outcome: Progressing   Problem: Clinical Measurements: Goal: Ability to maintain clinical measurements within normal limits will improve Outcome: Progressing Goal: Will remain free from infection Outcome: Progressing Goal: Diagnostic test results will improve Outcome: Progressing Goal: Respiratory complications will improve Outcome: Progressing Goal: Cardiovascular complication will be avoided Outcome: Progressing   Problem: Activity: Goal: Risk for activity intolerance will decrease Outcome: Progressing   Problem: Nutrition: Goal: Adequate nutrition will be maintained Outcome: Progressing   Problem: Coping: Goal: Level of anxiety will decrease Outcome: Progressing   Problem: Pain Managment: Goal: General experience of comfort will improve and/or be controlled Outcome: Progressing   Problem: Safety: Goal: Ability to remain free from injury will improve Outcome: Progressing   Problem: Skin Integrity: Goal: Risk for impaired skin integrity will decrease Outcome: Progressing   Problem: Education: Goal: Ability to describe self-care measures that may prevent or decrease complications (Diabetes Survival Skills Education) will improve Outcome: Progressing Goal: Individualized Educational Video(s) Outcome: Progressing   Problem: Coping: Goal: Ability to adjust to condition or change in health will improve Outcome: Progressing   Problem: Fluid Volume: Goal: Ability to maintain a balanced intake and output will improve Outcome: Progressing   Problem: Health Behavior/Discharge Planning: Goal: Ability to identify and utilize available resources and  services will improve Outcome: Progressing Goal: Ability to manage health-related needs will improve Outcome: Progressing   Problem: Metabolic: Goal: Ability to maintain appropriate glucose levels will improve Outcome: Progressing   Problem: Nutritional: Goal: Maintenance of adequate nutrition will improve Outcome: Progressing Goal: Progress toward achieving an optimal weight will improve Outcome: Progressing   Problem: Skin Integrity: Goal: Risk for impaired skin integrity will decrease Outcome: Progressing   Problem: Tissue Perfusion: Goal: Adequacy of tissue perfusion will improve Outcome: Progressing

## 2024-01-06 NOTE — Progress Notes (Signed)
 Rounding Note    Patient Name: Sue [name] Date of Encounter: 01/06/2024  [facility] Cardiologist: Kardie Tobb, DO.  Subjective   Resting in bed comfortably. Denies anginal chest pain  Inpatient Medications    Scheduled Meds:  acetaminophen   1,000 mg Oral Once   aspirin  EC  81 mg Oral Daily   insulin  aspart  0-15 Units Subcutaneous TID WC   insulin  aspart  0-5 Units Subcutaneous QHS   insulin  glargine-yfgn  45 Units Subcutaneous QHS   levothyroxine   100 mcg Oral QAC breakfast   metoprolol  succinate  100 mg Oral Daily   pantoprazole  (PROTONIX ) IV  40 mg Intravenous Q12H   pregabalin   100 mg Oral QHS   zolpidem   5 mg Oral QHS   Continuous Infusions:  sodium chloride  75 mL/hr at 01/06/24 0833   PRN Meds: acetaminophen  **OR** acetaminophen , albuterol , allopurinol , alum & mag hydroxide-simeth, methocarbamol  (ROBAXIN ) injection, ondansetron  **OR** ondansetron  (ZOFRAN ) IV, oxyCODONE    Vital Signs    Vitals:   01/05/24 1405 01/05/24 2107 01/05/24 2341 01/06/24 0511  BP: 133/74 119/77  (!) 140/71  Pulse: 83 100  82  Resp: 18 20  20   Temp: 98.2 F (36.8 C) (!) 101.2 F (38.4 C) 99.1 F (37.3 C) 98.7 F (37.1 C)  TempSrc: Oral Oral Oral Oral  SpO2: 99% 97%  100%  Weight:      Height:        Intake/Output Summary (Last 24 hours) at 01/06/2024 1049 Last data filed at 01/06/2024 1610 Gross per 24 hour  Intake 1951.99 ml  Output --  Net 1951.99 ml      12/31/2023    3:50 PM 12/29/2023    7:01 PM 11/07/2023    3:20 PM  Last 3 Weights  Weight (lbs) 260 lb 270 lb 268 lb 6.4 oz  Weight (kg) 117.935 kg 122.471 kg 121.745 kg      Telemetry    Normal sinus rhythm.- Personally Reviewed  ECG    No new ECG- Personally Reviewed  Physical Exam   GEN: No acute distress.   Neck: No JVD appreciated Cardiac: RRR, no murmurs, rubs, or gallops.  Respiratory: Diminished breath sounds, nasal cannula oxygen  GI: Soft, nontender, non-distended  MS: No edema;  No deformity. Neuro:  Nonfocal  Psych: Normal affect   Labs    High Sensitivity Troponin:   Recent Labs  Lab 12/31/23 1706 12/31/23 2108 01/04/24 0927 01/04/24 1103  TROPONINIHS 17 15 236* 235*     Chemistry Recent Labs  Lab 12/31/23 1706 01/01/24 0347 01/04/24 0927 01/05/24 0344 01/06/24 0412  NA 139   < > 139 136 137  K 2.9*   < > 3.8 3.3* 3.4*  CL 98   < > 100 95* 101  CO2 29   < > 30 31 27   GLUCOSE 359*   < > 130* 106* 84  BUN 17   < > 15 17 19   CREATININE 2.06*   < > 1.53* 2.03* 1.68*  CALCIUM  9.3   < > 9.2 9.0 8.4*  MG 1.9  --   --   --   --   PROT 8.6*  --   --   --   --   ALBUMIN  2.8*  --   --   --   --   AST 40  --   --   --   --   ALT 18  --   --   --   --  ALKPHOS 107  --   --   --   --   BILITOT 1.0  --   --   --   --   GFRNONAA 25*   < > 36* 26* 32*  ANIONGAP 12   < > 9 10 9    < > = values in this interval not displayed.    Lipids No results for input(s): "CHOL", "TRIG", "HDL", "LABVLDL", "LDLCALC", "CHOLHDL" in the last 168 hours.  Hematology Recent Labs  Lab 01/04/24 0927 01/05/24 0344 01/06/24 0412  WBC 13.0* 18.0* 13.2*  RBC 4.02 3.64* 3.66*  HGB 12.2 11.1* 11.1*  HCT 39.1 34.8* 35.7*  MCV 97.3 95.6 97.5  MCH 30.3 30.5 30.3  MCHC 31.2 31.9 31.1  RDW 14.4 14.4 14.4  PLT 193 173 156   Thyroid  No results for input(s): "TSH", "FREET4" in the last 168 hours.  BNPNo results for input(s): "BNP", "PROBNP" in the last 168 hours.  DDimer No results for input(s): "DDIMER" in the last 168 hours.   Radiology    DG Chest Port 1 View Result Date: 01/06/2024 CLINICAL DATA:  73 year old female with fever, hyperglycemia. EXAM: PORTABLE CHEST 1 VIEW COMPARISON:  Portable chest 01/04/2024 and earlier. FINDINGS: Portable AP semi upright view at 0503 hours. Improved lung volumes. Normal cardiac size and mediastinal contours. Stable trachea. Compared to 12/31/2023 there is now patchy, symmetric bibasilar lung opacity. Pulmonary vascularity appears  increased but no overt edema. No pneumothorax or air bronchograms. No acute osseous abnormality identified. IMPRESSION: Improved lung volumes since 01/04/2024.  Mild basilar atelectasis. Electronically Signed   By: Marlise Simpers M.D.   On: 01/06/2024 08:19   CT HEAD WO CONTRAST ( ) Result Date: 01/05/2024 CLINICAL DATA:  Altered mental status EXAM: CT HEAD WITHOUT CONTRAST TECHNIQUE: Contiguous axial images were obtained from the base of the skull through the vertex without intravenous contrast. RADIATION DOSE REDUCTION: This exam was performed according to the departmental dose-optimization program which includes automated exposure control, adjustment of the mA and/or kV according to patient size and/or use of iterative reconstruction technique. COMPARISON:  10/28/2022 FINDINGS: Brain: No mass,hemorrhage or extra-axial collection. Normal appearance of the parenchyma and CSF spaces. Vascular: No hyperdense vessel or unexpected vascular calcification. Skull: The visualized skull base, calvarium and extracranial soft tissues are normal. Sinuses/Orbits: Right maxillary chronic sinusitis.  Normal orbits. Other: None. IMPRESSION: 1. No acute intracranial abnormality. 2. Chronic right maxillary sinusitis. Electronically Signed   By: Juanetta Nordmann M.D.   On: 01/05/2024 01:02   ECHOCARDIOGRAM COMPLETE Result Date: 01/04/2024    ECHOCARDIOGRAM REPORT   Patient Name:   TANGELA UCCELLO Date of Exam: 01/04/2024 Medical Rec #:  [id number]      Height:       63.0 in Accession #:    [id number]     Weight:       260.0 lb Date of Birth:  1951/04/22      BSA:          2.162 m Patient Age:    72 years       BP:           124/80 mmHg Patient Gender: F              HR:           96 bpm. Exam Location:  Inpatient Procedure: 2D Echo, Color Doppler, Cardiac Doppler and Intracardiac            Opacification Agent (Both Spectral and Color Flow Doppler  were            utilized during procedure). Indications:    Abnormal ECG, Elevated Troponin   History:        Patient has prior history of Echocardiogram examinations.                 Stroke, Signs/Symptoms:Dyspnea and Chest Pain; Risk                 Factors:Hypertension, Former Smoker and Diabetes.  Sonographer:    Willey Harrier Referring Phys: [id number] [facility]  Sonographer Comments: Patient is obese and Technically difficult study due to poor echo windows. IMPRESSIONS  1. Left ventricular ejection fraction, by estimation, is 55 to 60%. The left ventricle has normal function. The left ventricle demonstrates regional wall motion abnormalities (see scoring diagram/findings for description). There is mild asymmetric left ventricular hypertrophy of the basal-septal segment. Left ventricular diastolic parameters are consistent with Grade I diastolic dysfunction (impaired relaxation).  2. Right ventricular systolic function is mildly reduced. The right ventricular size is mildly enlarged.  3. The mitral valve is normal in structure. Trivial mitral valve regurgitation. No evidence of mitral stenosis.  4. The aortic valve is tricuspid. There is mild calcification of the aortic valve. Aortic valve regurgitation is not visualized. No aortic stenosis is present.  5. The inferior vena cava is normal in size with greater than 50% respiratory variability, suggesting right atrial pressure of 3 mmHg. FINDINGS  Left Ventricle: Left ventricular ejection fraction, by estimation, is 55 to 60%. The left ventricle has normal function. The left ventricle demonstrates regional wall motion abnormalities. Definity  contrast agent was given IV to delineate the left ventricular endocardial borders. The left ventricular internal cavity size was normal in size. There is mild asymmetric left ventricular hypertrophy of the basal-septal segment. Left ventricular diastolic parameters are consistent with Grade I diastolic dysfunction (impaired relaxation).  LV Wall Scoring: The mid inferoseptal segment and mid inferior segment are  hypokinetic. Right Ventricle: The right ventricular size is mildly enlarged. No increase in right ventricular wall thickness. Right ventricular systolic function is mildly reduced. Left Atrium: Left atrial size was normal in size. Right Atrium: Right atrial size was normal in size. Pericardium: There is no evidence of pericardial effusion. Mitral Valve: The mitral valve is normal in structure. Trivial mitral valve regurgitation. No evidence of mitral valve stenosis. MV peak gradient, 6.9 mmHg. The mean mitral valve gradient is 3.0 mmHg. Tricuspid Valve: The tricuspid valve is not well visualized. Tricuspid valve regurgitation is trivial. No evidence of tricuspid stenosis. Aortic Valve: The aortic valve is tricuspid. There is mild calcification of the aortic valve. Aortic valve regurgitation is not visualized. No aortic stenosis is present. Aortic valve peak gradient measures 7.2 mmHg. Pulmonic Valve: The pulmonic valve was not well visualized. Pulmonic valve regurgitation is not visualized. No evidence of pulmonic stenosis. Aorta: The aortic root is normal in size and structure. Venous: The inferior vena cava is normal in size with greater than 50% respiratory variability, suggesting right atrial pressure of 3 mmHg. IAS/Shunts: No atrial level shunt detected by color flow Doppler.  LEFT VENTRICLE PLAX 2D LVIDd:         4.70 cm   Diastology LVIDs:         3.80 cm   LV e' medial:    10.00 cm/s LV PW:         0.90 cm   LV E/e' medial:  5.8 LV IVS:  0.90 cm   LV e' lateral:   9.36 cm/s LVOT diam:     2.10 cm   LV E/e' lateral: 6.2 LV SV:         56 LV SV Index:   26 LVOT Area:     3.46 cm  IVC IVC diam: 2.00 cm LEFT ATRIUM             Index        RIGHT ATRIUM          Index LA Vol (A2C):   31.9 ml 14.76 ml/m  RA Area:     9.59 cm LA Vol (A4C):   23.8 ml 11.01 ml/m  RA Volume:   16.80 ml 7.77 ml/m LA Biplane Vol: 27.9 ml 12.91 ml/m  AORTIC VALVE AV Area (Vmax): 2.19 cm AV Vmax:        134.00 cm/s AV Peak  Grad:   7.2 mmHg LVOT Vmax:      84.90 cm/s LVOT Vmean:     61.300 cm/s LVOT VTI:       0.161 m  AORTA Ao Root diam: 3.30 cm Ao Asc diam:  3.30 cm MITRAL VALVE MV Area (PHT): 4.17 cm     SHUNTS MV Area VTI:   2.53 cm     Systemic VTI:  0.16 m MV Peak grad:  6.9 mmHg     Systemic Diam: 2.10 cm MV Mean grad:  3.0 mmHg MV Vmax:       1.31 m/s MV Vmean:      73.8 cm/s MV Decel Time: 182 msec MV E velocity: 58.10 cm/s MV A velocity: 119.00 cm/s MV E/A ratio:  0.49 Jules Oar MD Electronically signed by Jules Oar MD Signature Date/Time: 01/04/2024/2:39:57 PM    Final     Cardiac Studies     Patient Profile     74 y.o. female with a history of chronic diastolic CHF,  hypertension, type 2 diabetes mellitus, TIA in 2016, emphysema with chronic hypoxic respiratory failure on 4 L of O2 at home, hypothyroidism, GERD, and obesity  who is being seen 01/04/2024 for the evaluation of chest pain at the request of Dr. Gwynneth Lessen.    Assessment & Plan    NSTEMI Patient was admitted for rhabdomyolysis after rolling off her bed and getting stuck in between her bed and her night stand. She was on the ground for about 36 hours before her family found her. She started having some epigastric/ chest pain yesterday evening. Initial high-sensitivity troponin on admission on 4/20 was negative x2; however, repeat troponin 4/24 elevated at 236 >> 235. EKG shows no acute ischemic changes. Patient reported chest pressure with associated dyspnea, nausea, and vomiting (though was also confused yesterday so difficult historian).  Currently denies any chest pain.  She is not a good candidate for any intervention at this time given her AKI.  Echocardiogram shows EF 55 to 60%.  Will plan medical management and can consider outpatient stress PET to evaluate for ischemia Has completed more than 48 hours of IV heparin .  Will discontinue IV heparin .   Continue aspirin  81 mg daily Continue Toprol -XL 100mg  daily.  Will hold off on  statin for now given rhabdomyolysis  No arrhythmia on telemetry. Echocardiogram also illustrates preserved LVEF Recommend outpatient ischemic workup as long as she remains asymptomatic clinically.   Chronic Diastolic CHF Last Echo in 08/2022 showed LVEF of 55-60% with mild asymmetric LVH of the basal-septal segment. Euvolemic on exam.  Home Torsemide  and  Jardiance  held on admission in setting of AKI and rhabdomyolysis. May restart closer to discharge once renal function is back to baseline.   Palpitations Patient has a history of intermittent palpitations lasting a couple of hours at a time. Prior monitor was ordered in 2022 but it does not look like this was completed.  Maintain K greater than 4, mag greater than 2 Continue Toprol -XL 100mg  daily.  Consider outpatient monitor if telemetry does not show anything.    Hypertension BP well controlled. Continue Toprol -XL 100mg  daily.  Blood pressure acceptable.   Hyperlipidemia LDL 33 in 10/2023.  Crestor  held in setting of rhabdomyolysis.  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  Last know creatinine 1.91 in 10/2023 but previously in 1.6 to 1.7 range in 2024.   Creatinine 1.68 today   Otherwise, per primary team: - Rhabdomyolysis (CK initially 1,427) - Acute metabolic encephalopathy - Elevated lactic acid  - COPD  - Hypothyroidism - Type 2 diabetes mellitus - GERD - History of CVA  Cardiology will sign off for now.  Please reach out if any questions or concerns arise in the interim.  Will arrange outpatient follow-up as well.  For questions or updates, please contact [facility] Please consult [email or web address] for contact info under      Signed, Judi Jaffe, DO  01/06/2024, 10:49 AM

## 2024-01-07 DIAGNOSIS — I10 Essential (primary) hypertension: Secondary | ICD-10-CM

## 2024-01-07 DIAGNOSIS — I214 Non-ST elevation (NSTEMI) myocardial infarction: Secondary | ICD-10-CM

## 2024-01-07 DIAGNOSIS — I5032 Chronic diastolic (congestive) heart failure: Secondary | ICD-10-CM | POA: Diagnosis not present

## 2024-01-07 DIAGNOSIS — M6282 Rhabdomyolysis: Secondary | ICD-10-CM | POA: Diagnosis not present

## 2024-01-07 LAB — BASIC METABOLIC PANEL WITH GFR
Anion gap: 10 (ref 5–15)
BUN: 17 mg/dL (ref 8–23)
CO2: 25 mmol/L (ref 22–32)
Calcium: 8.2 mg/dL — ABNORMAL LOW (ref 8.9–10.3)
Chloride: 101 mmol/L (ref 98–111)
Creatinine, Ser: 1.43 mg/dL — ABNORMAL HIGH (ref 0.44–1.00)
GFR, Estimated: 39 mL/min — ABNORMAL LOW (ref >60)
Glucose, Bld: 98 mg/dL (ref 70–99)
Potassium: 3.4 mmol/L — ABNORMAL LOW (ref 3.5–5.1)
Sodium: 136 mmol/L (ref 135–145)

## 2024-01-07 LAB — GLUCOSE, CAPILLARY
Glucose-Capillary: 77 mg/dL (ref 70–99)
Glucose-Capillary: 116 mg/dL — ABNORMAL HIGH (ref 70–99)
Glucose-Capillary: 122 mg/dL — ABNORMAL HIGH (ref 70–99)
Glucose-Capillary: 115 mg/dL — ABNORMAL HIGH (ref 70–99)

## 2024-01-07 LAB — CK: Total CK: 94 U/L (ref 38–234)

## 2024-01-07 LAB — MAGNESIUM: Magnesium: 2 mg/dL (ref 1.7–2.4)

## 2024-01-07 MED ORDER — POTASSIUM CHLORIDE CRYS ER 20 MEQ PO TBCR
30.0000 meq | EXTENDED_RELEASE_TABLET | Freq: Once | ORAL | Status: AC
  Administered 2024-01-07: 30 meq via ORAL
  Filled 2024-01-07: qty 1

## 2024-01-07 MED ORDER — POLYVINYL ALCOHOL 1.4 % OP SOLN
1.0000 [drp] | OPHTHALMIC | Status: DC | PRN
  Administered 2024-01-07: 1 [drp] via OPHTHALMIC
  Filled 2024-01-07: qty 15

## 2024-01-07 NOTE — Plan of Care (Signed)
  Problem: Education: Goal: Knowledge of General Education information will improve Description: Including pain rating scale, medication(s)/side effects and non-pharmacologic comfort measures Outcome: Progressing   Problem: Clinical Measurements: Goal: Diagnostic test results will improve Outcome: Progressing Goal: Respiratory complications will improve Outcome: Progressing   Problem: Pain Managment: Goal: General experience of comfort will improve and/or be controlled Outcome: Progressing   Problem: Safety: Goal: Ability to remain free from injury will improve Outcome: Progressing   Problem: Nutrition: Goal: Adequate nutrition will be maintained Outcome: Not Progressing

## 2024-01-07 NOTE — Plan of Care (Signed)
  Problem: Education: Goal: Knowledge of General Education information will improve Description: Including pain rating scale, medication(s)/side effects and non-pharmacologic comfort measures 01/07/2024 0456 by Sumner Ends, RN Outcome: Progressing 01/07/2024 0456 by Sumner Ends, RN Outcome: Progressing   Problem: Health Behavior/Discharge Planning: Goal: Ability to manage health-related needs will improve 01/07/2024 0456 by Sumner Ends, RN Outcome: Progressing 01/07/2024 0456 by Sumner Ends, RN Outcome: Progressing   Problem: Clinical Measurements: Goal: Ability to maintain clinical measurements within normal limits will improve 01/07/2024 0456 by Sumner Ends, RN Outcome: Progressing 01/07/2024 0456 by Sumner Ends, RN Outcome: Progressing Goal: Will remain free from infection 01/07/2024 0456 by Sumner Ends, RN Outcome: Progressing 01/07/2024 0456 by Sumner Ends, RN Outcome: Progressing Goal: Diagnostic test results will improve 01/07/2024 0456 by Sumner Ends, RN Outcome: Progressing 01/07/2024 0456 by Sumner Ends, RN Outcome: Progressing Goal: Respiratory complications will improve 01/07/2024 0456 by Sumner Ends, RN Outcome: Progressing 01/07/2024 0456 by Sumner Ends, RN Outcome: Progressing Goal: Cardiovascular complication will be avoided 01/07/2024 0456 by Sumner Ends, RN Outcome: Progressing 01/07/2024 0456 by Sumner Ends, RN Outcome: Progressing   Problem: Activity: Goal: Risk for activity intolerance will decrease 01/07/2024 0456 by Sumner Ends, RN Outcome: Progressing 01/07/2024 0456 by Sumner Ends, RN Outcome: Progressing   Problem: Nutrition: Goal: Adequate nutrition will be maintained 01/07/2024 0456 by Sumner Ends, RN Outcome: Progressing 01/07/2024 0456 by Sumner Ends, RN Outcome: Progressing   Problem: Coping: Goal: Level of anxiety will decrease 01/07/2024 0456 by Sumner Ends, RN Outcome: Progressing 01/07/2024 0456 by  Sumner Ends, RN Outcome: Progressing   Problem: Pain Managment: Goal: General experience of comfort will improve and/or be controlled 01/07/2024 0456 by Sumner Ends, RN Outcome: Progressing 01/07/2024 0456 by Sumner Ends, RN Outcome: Progressing   Problem: Safety: Goal: Ability to remain free from injury will improve 01/07/2024 0456 by Sumner Ends, RN Outcome: Progressing 01/07/2024 0456 by Sumner Ends, RN Outcome: Progressing   Problem: Skin Integrity: Goal: Risk for impaired skin integrity will decrease 01/07/2024 0456 by Sumner Ends, RN Outcome: Progressing 01/07/2024 0456 by Sumner Ends, RN Outcome: Progressing   Problem: Education: Goal: Ability to describe self-care measures that may prevent or decrease complications (Diabetes Survival Skills Education) will improve 01/07/2024 0456 by Sumner Ends, RN Outcome: Progressing 01/07/2024 0456 by Sumner Ends, RN Outcome: Progressing Goal: Individualized Educational Video(s) 01/07/2024 0456 by Sumner Ends, RN Outcome: Progressing 01/07/2024 0456 by Sumner Ends, RN Outcome: Progressing   Problem: Coping: Goal: Ability to adjust to condition or change in health will improve Outcome: Progressing   Problem: Fluid Volume: Goal: Ability to maintain a balanced intake and output will improve Outcome: Progressing   Problem: Health Behavior/Discharge Planning: Goal: Ability to identify and utilize available resources and services will improve Outcome: Progressing Goal: Ability to manage health-related needs will improve Outcome: Progressing   Problem: Metabolic: Goal: Ability to maintain appropriate glucose levels will improve Outcome: Progressing   Problem: Nutritional: Goal: Maintenance of adequate nutrition will improve Outcome: Progressing Goal: Progress toward achieving an optimal weight will improve Outcome: Progressing   Problem: Skin Integrity: Goal: Risk for impaired skin integrity will  decrease Outcome: Progressing   Problem: Tissue Perfusion: Goal: Adequacy of tissue perfusion will improve Outcome: Progressing

## 2024-01-07 NOTE — Consult Note (Signed)
 WOC Nurse Consult Note: Reason for Consult: right great toe wound, patient fell at home, sustained some skin tears to the bilateral LEs, however great toe appears intact; red only Wound type: none Pressure Injury POA: NA Measurement: Wound bed: none Reviewed images, verified with bedside nursing. No open wound, no topical care needed.    Re consult if needed, will not follow at this time. Thanks  Zyara Riling [employer], RN,CWOCN, CNS, CWON-AP [phone])

## 2024-01-07 NOTE — Progress Notes (Addendum)
 PROGRESS NOTE  Sue [name]  DOB: 1951-02-25  PCP: Estill Hemming, DO ZOX:[id number]  DOA: 12/31/2023  LOS: 7 days  Hospital Day: 8  Brief narrative: Sue [name] is a 73 y.o. female with PMH significant for morbid obesity, DM2, HTN, stroke/TIA, COPD on 3 L oxygen , GERD, hypothyroidism. 4/20, patient was brought to the ED for a fall. Patient had rolled out of bed and fallen between her nightstand and was unable to get up.  She was reportedly on the ground for more than 24 hours until her family found her.    On initial workup, she was noted to have hyperglycemia, hypokalemia, elevated creatinine, elevated lactic acid and elevated CK Skeletal survey was unremarkable for any fracture. Patient started on IV fluid Per patient's daughter, there was concern of excessive pain control usage resulting to intermittent episodes of confusion.   4/27: Hemodynamically stable.  CK normalized so discontinuing IV fluid.  Preliminary blood cultures Enterobacter Cloasea-pending susceptibility.  Urine cultures were not sent.  Ceftriaxone  was switched with cefepime  yesterday. Mild hypokalemia with normal magnesium   Subjective: Patient continued to feel very weak.  Initial PT recommendations were SNF but patient wants to go home with home health which was ordered.  Assessment and plan: NSTEMI Complain of chest pain since 4/23.   Troponin was elevated and hence cardiology was consulted. Medical management done with heparin  drip for 48 hours Echocardiogram without wall motion abnormality. No intervention planned per cardiology.  No chest pain currently.   Sepsis - not POA Patient became febrile during current hospitalization, having some nausea, vomiting and diarrhea.  Chest x-ray without any acute abnormality, UA with large leukocytes but cultures were not sent.  Blood cultures with Enterobacter cloacae-pending susceptibility. - Ceftriaxone  switched to cefepime  on 4/26 - Follow-up final  culture results  Black vomitus Daughter mentioned vomitus was black 2 days ago, no more vomiting.  Hemoglobin stable.  AKI Creatinine improved to 1.43 today.  Seems like having underlying CKD stage IIIb and creatinine now at baseline. - Monitor renal function - Avoid nephrotoxins  Rhabdomyolysis Because of being on the floor for more than 24-hour duration CK has been normalized now -Stop IV fluid   Hypokalemia Potassium at 3.4 with normal magnesium  - Replete potassium and monitor  Acute metabolic encephalopathy  chronic pain Insomnia Mental status now at baseline PTA meds-  Lyrica  3 times daily, Seroquel  nightly, Ambien  nightly  Ativan  scheduled and as needed, oxycodone  as needed, Currently continued on Ambien  and Lyrica  nightly.  Continue to hold orders.   Type 2 diabetes mellitus A1c 10.3 on 11/07/2023 PTA meds-Trulicity , Jardiance , Lantus  74 units daily, sliding scale insulin  Currently blood sugar level is adequately controlled on Semglee  45 units nightly and SSI/Accu-Cheks  Hypertension Continue metoprolol  On hold torsemide   Prior CVA  HLD -Resume statin as CK has been normalized  COPD Chronic hypoxic respiratory failure On 3 L oxygen  at home. Continue bronchodilators   Hypothyroidism Continue Synthroid    Mobility: Encourage ambulation.  PT eval obtained.  SNF recommended-patient wants to go home with home health  Goals of care   Code Status: Full Code     DVT prophylaxis:  SCDs Start: 12/31/23 1955   Antimicrobials: None Fluid: Discontinued today Consultants: None Family Communication: Discussed with daughter on phone.  Status: Inpatient Level of care:  Progressive   Patient is from: Home Needs to continue in-hospital care: 1 to 2 days Anticipated d/c to: Not stable for discharge.  Hopefully stable in next 1 to  2 days, awaiting final blood culture results    Diet:  Diet Order             Diet Carb Modified Fluid consistency: Thin; Room  service appropriate? Yes  Diet effective now                   Scheduled Meds:  aspirin  EC  81 mg Oral Daily   enoxaparin  (LOVENOX ) injection  60 mg Subcutaneous Daily   insulin  aspart  0-15 Units Subcutaneous TID WC   insulin  aspart  0-5 Units Subcutaneous QHS   insulin  glargine-yfgn  45 Units Subcutaneous QHS   levothyroxine   100 mcg Oral QAC breakfast   metoprolol  succinate  100 mg Oral Daily   pantoprazole   40 mg Oral BID   pregabalin   100 mg Oral QHS   zolpidem   5 mg Oral QHS    PRN meds: acetaminophen  **OR** acetaminophen , albuterol , allopurinol , alum & mag hydroxide-simeth, methocarbamol  (ROBAXIN ) injection, ondansetron  **OR** ondansetron  (ZOFRAN ) IV, oxyCODONE    Infusions:   ceFEPime  (MAXIPIME ) IV Stopped (01/07/24 1610)     Antimicrobials: Anti-infectives (From admission, onward)    Start     Dose/Rate Route Frequency Ordered Stop   01/06/24 2000  ceFEPIme  (MAXIPIME ) 2 g in sodium chloride  0.9 % 100 mL IVPB  Status:  Discontinued        2 g 200 mL/hr over 30 Minutes Intravenous  Once 01/06/24 1849 01/06/24 1904   01/06/24 2000  ceFEPIme  (MAXIPIME ) 2 g in sodium chloride  0.9 % 100 mL IVPB        2 g 200 mL/hr over 30 Minutes Intravenous Every 12 hours 01/06/24 1904     01/06/24 1700  cefTRIAXone  (ROCEPHIN ) 2 g in sodium chloride  0.9 % 100 mL IVPB  Status:  Discontinued        2 g 200 mL/hr over 30 Minutes Intravenous Every 24 hours 01/06/24 1119 01/06/24 1849   01/06/24 0345  ceFEPIme  (MAXIPIME ) 2 g in sodium chloride  0.9 % 100 mL IVPB        2 g 200 mL/hr over 30 Minutes Intravenous  Once 01/06/24 0254 01/06/24 0700       Objective: Vitals:   01/07/24 1100 01/07/24 1200  BP:    Pulse:    Resp: 17 13  Temp:    SpO2:      Intake/Output Summary (Last 24 hours) at 01/07/2024 1314 Last data filed at 01/07/2024 1256 Gross per 24 hour  Intake 1447.57 ml  Output 1000 ml  Net 447.57 ml   Filed Weights   12/31/23 1550  Weight: 117.9 kg   Weight  change:  Body mass index is 46.06 kg/m.   Physical Exam: General.  Morbidly obese elderly lady, in no acute distress. Pulmonary.  Lungs clear bilaterally, normal respiratory effort. CV.  Regular rate and rhythm, no JVD, rub or murmur. Abdomen.  Soft, nontender, nondistended, BS positive. CNS.  Alert and oriented .  No focal neurologic deficit. Extremities.  No edema, no cyanosis, pulses intact and symmetrical.  Bilateral knee abrasions.  Data Review: I have personally reviewed the laboratory data and studies available.  F/u labs ordered Unresulted Labs (From admission, onward)     Start     Ordered   01/06/24 0500  Basic metabolic panel with GFR  Daily,   R      01/05/24 1429   Unscheduled  Occult blood card to lab, stool  As needed,   R     Comments: FOBT of  the vomitus    01/05/24 1429            Total time spent in review of labs and imaging, patient evaluation, formulation of plan, documentation and communication with family: 45 minutes  Signed, Luna Salinas, MD [facility] 01/07/2024

## 2024-01-07 NOTE — Plan of Care (Signed)
  Problem: Education: Goal: Knowledge of General Education information will improve Description: Including pain rating scale, medication(s)/side effects and non-pharmacologic comfort measures Outcome: Progressing   Problem: Health Behavior/Discharge Planning: Goal: Ability to manage health-related needs will improve Outcome: Progressing   Problem: Clinical Measurements: Goal: Ability to maintain clinical measurements within normal limits will improve Outcome: Progressing Goal: Will remain free from infection Outcome: Progressing Goal: Diagnostic test results will improve Outcome: Progressing Goal: Respiratory complications will improve Outcome: Progressing Goal: Cardiovascular complication will be avoided Outcome: Progressing   Problem: Activity: Goal: Risk for activity intolerance will decrease Outcome: Progressing   Problem: Nutrition: Goal: Adequate nutrition will be maintained Outcome: Progressing   Problem: Coping: Goal: Level of anxiety will decrease Outcome: Progressing   Problem: Pain Managment: Goal: General experience of comfort will improve and/or be controlled Outcome: Progressing   Problem: Safety: Goal: Ability to remain free from injury will improve Outcome: Progressing   Problem: Skin Integrity: Goal: Risk for impaired skin integrity will decrease Outcome: Progressing   Problem: Education: Goal: Ability to describe self-care measures that may prevent or decrease complications (Diabetes Survival Skills Education) will improve Outcome: Progressing Goal: Individualized Educational Video(s) Outcome: Progressing   Problem: Coping: Goal: Ability to adjust to condition or change in health will improve Outcome: Progressing   Problem: Fluid Volume: Goal: Ability to maintain a balanced intake and output will improve Outcome: Progressing   Problem: Health Behavior/Discharge Planning: Goal: Ability to identify and utilize available resources and  services will improve Outcome: Progressing Goal: Ability to manage health-related needs will improve Outcome: Progressing   Problem: Metabolic: Goal: Ability to maintain appropriate glucose levels will improve Outcome: Progressing   Problem: Nutritional: Goal: Maintenance of adequate nutrition will improve Outcome: Progressing Goal: Progress toward achieving an optimal weight will improve Outcome: Progressing   Problem: Skin Integrity: Goal: Risk for impaired skin integrity will decrease Outcome: Progressing   Problem: Tissue Perfusion: Goal: Adequacy of tissue perfusion will improve Outcome: Progressing

## 2024-01-08 DIAGNOSIS — E876 Hypokalemia: Secondary | ICD-10-CM

## 2024-01-08 DIAGNOSIS — R7881 Bacteremia: Secondary | ICD-10-CM

## 2024-01-08 DIAGNOSIS — I10 Essential (primary) hypertension: Secondary | ICD-10-CM | POA: Diagnosis not present

## 2024-01-08 DIAGNOSIS — M6282 Rhabdomyolysis: Secondary | ICD-10-CM | POA: Diagnosis not present

## 2024-01-08 DIAGNOSIS — I5032 Chronic diastolic (congestive) heart failure: Secondary | ICD-10-CM | POA: Diagnosis not present

## 2024-01-08 LAB — BASIC METABOLIC PANEL WITH GFR
Anion gap: 7 (ref 5–15)
BUN: 16 mg/dL (ref 8–23)
CO2: 25 mmol/L (ref 22–32)
Calcium: 8 mg/dL — ABNORMAL LOW (ref 8.9–10.3)
Chloride: 104 mmol/L (ref 98–111)
Creatinine, Ser: 1.35 mg/dL — ABNORMAL HIGH (ref 0.44–1.00)
GFR, Estimated: 42 mL/min — ABNORMAL LOW (ref >60)
Glucose, Bld: 65 mg/dL — ABNORMAL LOW (ref 70–99)
Potassium: 3.7 mmol/L (ref 3.5–5.1)
Sodium: 136 mmol/L (ref 135–145)

## 2024-01-08 LAB — CULTURE, BLOOD (ROUTINE X 2)

## 2024-01-08 LAB — CBC
HCT: 32.5 % — ABNORMAL LOW (ref 36.0–46.0)
Hemoglobin: 10 g/dL — ABNORMAL LOW (ref 12.0–15.0)
MCH: 29.9 pg (ref 26.0–34.0)
MCHC: 30.8 g/dL (ref 30.0–36.0)
MCV: 97.3 fL (ref 80.0–100.0)
Platelets: 169 10*3/uL (ref 150–400)
RBC: 3.34 MIL/uL — ABNORMAL LOW (ref 3.87–5.11)
RDW: 14.6 % (ref 11.5–15.5)
WBC: 8.4 10*3/uL (ref 4.0–10.5)
nRBC: 0 % (ref 0.0–0.2)

## 2024-01-08 LAB — GLUCOSE, CAPILLARY
Glucose-Capillary: 83 mg/dL (ref 70–99)
Glucose-Capillary: 77 mg/dL (ref 70–99)

## 2024-01-08 MED ORDER — CIPROFLOXACIN HCL 500 MG PO TABS: 500.0000 mg | ORAL_TABLET | Freq: Two times a day (BID) | ORAL | 0 refills | Status: DC

## 2024-01-08 MED ORDER — CIPROFLOXACIN HCL 500 MG PO TABS
500.0000 mg | ORAL_TABLET | Freq: Two times a day (BID) | ORAL | Status: DC
Start: 2024-01-08 — End: 2024-01-08
  Administered 2024-01-08: 500 mg via ORAL
  Filled 2024-01-08: qty 1

## 2024-01-08 MED ORDER — ASPIRIN 81 MG PO TBEC: 81.0000 mg | DELAYED_RELEASE_TABLET | Freq: Every day | ORAL | 12 refills | Status: DC

## 2024-01-08 MED ORDER — PANTOPRAZOLE SODIUM 40 MG PO TBEC: 40.0000 mg | DELAYED_RELEASE_TABLET | Freq: Two times a day (BID) | ORAL | 1 refills | Status: DC

## 2024-01-08 MED ORDER — PREGABALIN 100 MG PO CAPS: 100.0000 mg | ORAL_CAPSULE | Freq: Every day | ORAL | 0 refills | Status: DC

## 2024-01-08 NOTE — Plan of Care (Signed)
  Problem: Education: Goal: Knowledge of General Education information will improve Description: Including pain rating scale, medication(s)/side effects and non-pharmacologic comfort measures Outcome: Progressing   Problem: Health Behavior/Discharge Planning: Goal: Ability to manage health-related needs will improve Outcome: Progressing   Problem: Clinical Measurements: Goal: Ability to maintain clinical measurements within normal limits will improve Outcome: Progressing Goal: Will remain free from infection Outcome: Progressing Goal: Diagnostic test results will improve Outcome: Progressing Goal: Respiratory complications will improve Outcome: Progressing Goal: Cardiovascular complication will be avoided Outcome: Progressing   Problem: Activity: Goal: Risk for activity intolerance will decrease Outcome: Progressing   Problem: Nutrition: Goal: Adequate nutrition will be maintained Outcome: Progressing   Problem: Coping: Goal: Level of anxiety will decrease Outcome: Progressing   Problem: Pain Managment: Goal: General experience of comfort will improve and/or be controlled Outcome: Progressing   Problem: Safety: Goal: Ability to remain free from injury will improve Outcome: Progressing   Problem: Skin Integrity: Goal: Risk for impaired skin integrity will decrease Outcome: Progressing   Problem: Education: Goal: Ability to describe self-care measures that may prevent or decrease complications (Diabetes Survival Skills Education) will improve Outcome: Progressing Goal: Individualized Educational Video(s) Outcome: Progressing   Problem: Coping: Goal: Ability to adjust to condition or change in health will improve Outcome: Progressing   Problem: Fluid Volume: Goal: Ability to maintain a balanced intake and output will improve Outcome: Progressing   Problem: Health Behavior/Discharge Planning: Goal: Ability to identify and utilize available resources and  services will improve Outcome: Progressing Goal: Ability to manage health-related needs will improve Outcome: Progressing   Problem: Metabolic: Goal: Ability to maintain appropriate glucose levels will improve Outcome: Progressing   Problem: Nutritional: Goal: Maintenance of adequate nutrition will improve Outcome: Progressing Goal: Progress toward achieving an optimal weight will improve Outcome: Progressing   Problem: Skin Integrity: Goal: Risk for impaired skin integrity will decrease Outcome: Progressing   Problem: Tissue Perfusion: Goal: Adequacy of tissue perfusion will improve Outcome: Progressing

## 2024-01-08 NOTE — Plan of Care (Signed)
 ID/IP brief note   Spoke with primary team Enterobacter cloacae in blood on 4/26 (adm 4/20). ?present uti in the beginning. Technically would meet nosocomial bsi but again unclear   No sign of deep seated source or piv's related   -tx reviewed with primary team

## 2024-01-08 NOTE — Progress Notes (Signed)
 Physical Therapy Treatment Patient Details Name: Sue [name] MRN: [id number] DOB: 1951/08/28 Today's Date: 01/08/2024   History of Present Illness 73 y.o. female  Patient had rolled out of bed and fallen between her nightstand and was unable to get up. She reported being on the ground for about 24 hours. admitted with  Rhabdomyolysis. PMH: T2D, depression, hypothyroidism, prior stroke, morbid obesity, HTN, COPD    PT Comments  PT - Cognition Comments: AxO x 3 slighly slow to respond.  Lives home alone when she fell/rolled OOB reaching for "something" and stayed there "for three days" stated Pt.  Pt amb with a Rollator and has been on oxygen  "for years".  Her Sister assists with groceries/MD appts. Her Daughter plans to take time off work.  Pt declines SNF rec. Pt was OOB in recliner.  Assisted with amb in hallway with improvement.  General transfer comment: Pt self anle to rise from recliner with < 25% VC's on proper hand placement and increased time to rise. General Gait Details: Pt was able to amb 22 feet with walker on 3 lts oxygen  sats 97%.  Slow but steady gait.  Mild c/o R hip pain.  MaX c/o stiffness. Tolerated a household distance. Pt declined SNF rec and plans to return home with family support.    If plan is discharge home, recommend the following: A lot of help with walking and/or transfers;A lot of help with bathing/dressing/bathroom;Assistance with cooking/housework;Assist for transportation;Help with stairs or ramp for entrance   Can travel by private vehicle     Yes  Equipment Recommendations       Recommendations for Other Services       Precautions / Restrictions Precautions Precautions: Fall Precaution/Restrictions Comments: Oxygen  dep 3 lts Restrictions Weight Bearing Restrictions Per Provider Order: No     Mobility  Bed Mobility               General bed mobility comments: OOB in recliner    Transfers Overall transfer level: Needs  assistance Equipment used: Rolling walker (2 wheels) Transfers: Sit to/from Stand Sit to Stand: Contact guard assist, Supervision           General transfer comment: Pt self anle to rise from recliner with < 25% VC's on proper hand placement and increased time to rise.    Ambulation/Gait Ambulation/Gait assistance: Supervision, Contact guard assist Gait Distance (Feet): 22 Feet Assistive device: Rolling walker (2 wheels) Gait Pattern/deviations: Step-to pattern Gait velocity: decreased     General Gait Details: Pt was able to amb 22 feet with walker on 3 lts oxygen  sats 97%.  Slow but steady gait.  Mild c/o R hip pain.  MaX c/o stiffness. Tolerated a household distance.   Stairs             Wheelchair Mobility     Tilt Bed    Modified Rankin (Stroke Patients Only)       Balance                                            Communication    Cognition Arousal: Alert Behavior During Therapy: WFL for tasks assessed/performed   PT - Cognitive impairments: No apparent impairments                       PT - Cognition Comments: AxO x 3 slighly slow  to respond.  Lives home alone when she fell/rolled OOB reaching for "something" and stayed there "for three days" stated Pt.  Pt amb with a Rollator and has been on oxygen  "for years".  Her Sister assists with groceries/MD appts. Her Daughter plans to take time off work.  Pt declines SNF rec. Following commands: Intact Following commands impaired: Follows one step commands with increased time    Cueing Cueing Techniques: Verbal cues  Exercises      General Comments        Pertinent Vitals/Pain Pain Assessment Pain Assessment: Faces Faces Pain Scale: Hurts little more Pain Location: R hip Pain Descriptors / Indicators: Tender, Grimacing, Constant Pain Intervention(s): Monitored during session, Premedicated before session, Repositioned    Home Living                           Prior Function            PT Goals (current goals can now be found in the care plan section) Progress towards PT goals: Progressing toward goals    Frequency    Min 3X/week      PT Plan      Co-evaluation              AM-PAC PT "6 Clicks" Mobility   Outcome Measure  Help needed turning from your back to your side while in a flat bed without using bedrails?: A Little Help needed moving from lying on your back to sitting on the side of a flat bed without using bedrails?: A Little Help needed moving to and from a bed to a chair (including a wheelchair)?: A Little Help needed standing up from a chair using your arms (e.g., wheelchair or bedside chair)?: A Little Help needed to walk in hospital room?: A Little Help needed climbing 3-5 steps with a railing? : A Lot 6 Click Score: 17    End of Session Equipment Utilized During Treatment: Gait belt;Oxygen  Activity Tolerance: Patient limited by fatigue Patient left: in chair;with call bell/phone within reach Nurse Communication: Mobility status PT Visit Diagnosis: Other abnormalities of gait and mobility (R26.89)     Time: 1225-1240 PT Time Calculation (min) (ACUTE ONLY): 15 min  Charges:    $Gait Training: 8-22 mins PT General Charges $$ ACUTE PT VISIT: 1 Visit                     Bess Broody  PTA Acute  Rehabilitation Services Office M-F          [phone]

## 2024-01-08 NOTE — Discharge Summary (Signed)
 Physician Discharge Summary   Patient: Sue [name] MRN: [id number] DOB: 04/28/1951  Admit date:     12/31/2023  Discharge date: 01/08/24  Discharge Physician: Luna Salinas   PCP: Estill Hemming, DO   Recommendations at discharge:  Please obtain CBC and BMP on follow-up Please ensure completion of antibiotics for gram-negative bacteremia Follow-up with primary care provider  Discharge Diagnoses: Principal Problem:   Rhabdomyolysis Active Problems:   Non-ST elevation (NSTEMI) myocardial infarction [facility])   Benign hypertension   Chronic heart failure with preserved ejection fraction (HFpEF) (HCC)   Bacteremia   Hospital Course: Sue [name] is a 73 y.o. female with PMH significant for morbid obesity, DM2, HTN, stroke/TIA, COPD on 3 L oxygen , GERD, hypothyroidism. 4/20, patient was brought to the ED for a fall. Patient had rolled out of bed and fallen between her nightstand and was unable to get up.  She was reportedly on the ground for more than 24 hours until her family found her.     On initial workup, she was noted to have hyperglycemia, hypokalemia, elevated creatinine, elevated lactic acid and elevated CK Skeletal survey was unremarkable for any fracture. Patient started on IV fluid Per patient's daughter, there was concern of excessive pain control usage resulting to intermittent episodes of confusion.   Patient was also complaining of chest pain with elevated troponin, concern of NSTEMI so cardiology was consulted.  Patient received 48 hours of heparin  infusion.  Echocardiogram was without any wall motion abnormalities.  No further intervention was planned by cardiology as chest pain resolved.  Patient met sepsis criteria during admission when she developed fever and leukocytosis.  Chest x-ray was without any acute abnormality.  UA with large leukocytes but cultures were not sent. Blood cultures with pansensitive Enterobacter cloacae , patient received cefepime  while  in the hospital and is discharged on 1 week of ciprofloxacin  based on susceptibility result.  Fever and leukocytosis resolved.  Patient also has AKI with history of CKD stage IIIb, renal functions continue to improved with IV fluid and now at baseline.  Patient was also found to have rhabdomyolysis with elevated CK level, likely due to being on floor for more than 24 hours.  CK has been normalized with IV fluid.  She was also treated for hypokalemia and resolved before discharge.  There was concern of acute metabolic encephalopathy on admission.  Patient was on multiple sedating medications at home.  We decreased the dose of Lyrica , she should not be taking much sedating medications and need to have a close follow-up with PCP for further recommendation.  We held her home statin initially due to rhabdomyolysis which she can resume now.  History of COPD and chronic hypoxic respiratory failure requiring 3 to 4 L of oxygen , she remained at baseline.  PT and OT evaluation was obtained-they recommended short-term rehab but patient declined and wants to go home with home health which was ordered.  Patient will continue with rest of her home medications and need to have a close follow-up with her providers for further assistance.   Consultants: None Procedures performed: None Disposition: Home health Diet recommendation:  Discharge Diet Orders (From admission, onward)     Start     Ordered   01/08/24 0000  Diet - low sodium heart healthy        01/08/24 1029           Cardiac and Carb modified diet DISCHARGE MEDICATION: Allergies as of 01/08/2024  Reactions   Hydrocodone  Other (See Comments)   Constipation and hallucinations   Norvasc  [amlodipine  Besylate] Swelling, Other (See Comments)   Marked swelling of the limbs   Tizanidine  Other (See Comments)   After the 3rd dose, the patient's mouth began to feel numb and she felt like she was a having a "hot flash"         Medication List     STOP taking these medications    QUEtiapine  200 MG tablet Commonly known as: SEROQUEL        TAKE these medications    Accu-Chek Guide test strip Generic drug: glucose blood USE 1 STRIP TO CHECK GLUCOSE 4 TIMES DAILY   Accu-Chek Guide w/Device Kit USE   TO CHECK GLUCOSE UP TO 4 TIMES DAILY AS DIRECTED   Accu-Chek Softclix Lancets lancets USE 1  TO CHECK GLUCOSE 4 TIMES DAILY   albuterol  108 (90 Base) MCG/ACT inhaler Commonly known as: VENTOLIN  HFA Inhale 1-2 puffs into the lungs every 6 (six) hours as needed for wheezing or shortness of breath.   allopurinol  100 MG tablet Commonly known as: ZYLOPRIM  Take 100 mg by mouth daily.   aspirin  EC 81 MG tablet Take 1 tablet (81 mg total) by mouth daily. Swallow whole.   BD Pen Needle Nano U/F 32G X 4 MM Misc Generic drug: Insulin  Pen Needle Use to inject insulin    busPIRone  15 MG tablet Commonly known as: BUSPAR  Take 1 tablet (15 mg total) by mouth 2 (two) times daily.   ciprofloxacin  500 MG tablet Commonly known as: CIPRO  Take 1 tablet (500 mg total) by mouth 2 (two) times daily for 7 days.   empagliflozin  10 MG Tabs tablet Commonly known as: Jardiance  Take 1 tablet (10 mg total) by mouth daily before breakfast.   famotidine  20 MG tablet Commonly known as: PEPCID  Take 20 mg by mouth daily as needed for heartburn or indigestion.   ibuprofen  200 MG tablet Commonly known as: ADVIL  Take 400 mg by mouth every 6 (six) hours as needed for moderate pain (pain score 4-6).   levothyroxine  100 MCG tablet Commonly known as: SYNTHROID  Take 1 tablet (100 mcg total) by mouth daily before breakfast.   lidocaine  5 % Commonly known as: Lidoderm  Place 1 patch onto the skin daily. Remove & Discard patch within 12 hours or as directed by MD   LORazepam  1 MG tablet Commonly known as: ATIVAN  TAKE 1/2 TO 1 AND 1/2 TABLETS AT BEDTIME AND AN ADDITIONAL 1/2 TAB ONCE A DAY AS NEEDED FOR ANXIETY What changed: See  the new instructions.   magic mouthwash w/lidocaine  Soln Take 5 mLs by mouth 4 (four) times daily as needed for mouth pain. Swish and Spit   metoprolol  succinate 50 MG 24 hr tablet Commonly known as: TOPROL -XL Take 2 tablets (100 mg total) by mouth daily. Take with or immediately following a meal What changed: how much to take   NovoLOG  FlexPen 100 UNIT/ML FlexPen Generic drug: insulin  aspart Novolog  10 units with each meal PLUS the scale if needed -Novolog  correctional insulin : ADD extra units on insulin  to your meal-time Novolog  dose if your blood sugars are higher than 160. Use the scale below to help guide you:  Blood sugar before meal Number of units to inject Less than 160 0 unit 161 -  190 1 units 191 -  220 2 units 221 -  250 3 units 251 -  280 4 units 281 -  310 5 units 311 -  340 6  units 341 -  370 7 units 371 -  400 8 units Max daily 45 units   oxyCODONE  5 MG immediate release tablet Commonly known as: Roxicodone  Take 1 tablet (5 mg total) by mouth every 6 (six) hours as needed.   OXYGEN  Inhale 3-4 L/min into the lungs continuous.   pantoprazole  40 MG tablet Commonly known as: PROTONIX  Take 1 tablet (40 mg total) by mouth 2 (two) times daily.   potassium chloride  SA 20 MEQ tablet Commonly known as: KLOR-CON  M Take 1 tablet (20 mEq total) by mouth daily.   pregabalin  100 MG capsule Commonly known as: LYRICA  Take 1 capsule (100 mg total) by mouth at bedtime. What changed:  medication strength how much to take how to take this when to take this additional instructions   rosuvastatin  10 MG tablet Commonly known as: CRESTOR  TAKE 1 TABLET BY MOUTH ONCE DAILY FOR HEART HEALTH AND TO LOWER CHOLESTEROL   senna-docusate 8.6-50 MG tablet Commonly known as: Senokot-S Take 1 tablet by mouth daily.   torsemide  20 MG tablet Commonly known as: DEMADEX  Take 3 tablets (60 mg total) by mouth daily.   Toujeo  SoloStar 300 UNIT/ML Solostar Pen Generic drug: insulin  glargine  (1 Unit Dial) INJECT 64 UNITS SUBCUTANEOUSLY ONCE DAILY IN THE AFTERNOON.**APPOINTMENT NEEDED FOR FURTHER REFILLS** What changed: See the new instructions.   Trulicity  3 MG/0.5ML Soaj Generic drug: Dulaglutide  Inject 3 mg into the skin once a week.   zolpidem  12.5 MG CR tablet Commonly known as: AMBIEN  CR TAKE 1 TABLET BY MOUTH AT BEDTIME AS NEEDED FOR SLEEP What changed: when to take this        Follow-up Information     Roel Clarity R, DO Follow up.   Specialty: Family Medicine Contact information: [address] [place] [address] [phone]         [facility] Follow up.   Specialty: Home Health Services Why: Home Health Physical Therapy/Occupational Therapy/Home Health Aide/Skilled Nursing (RN) Contact information: [address] [place] [address] [phone]         [facility] Follow up on 02/06/2024.   Specialty: Cardiology Why: Marlyse Single, PA-C for Dr. Emmette Harms at 8:25 am. please arrive 15 mins early and bring all of your medications. Contact information: [address], [address] [place]  [address] [phone]               Discharge Exam: Cleavon Curls Weights   12/31/23 1550  Weight: 117.9 kg   General.  Morbidly obese lady, in no acute distress. Pulmonary.  Lungs clear bilaterally, normal respiratory effort. CV.  Regular rate and rhythm, no JVD, rub or murmur. Abdomen.  Soft, nontender, nondistended, BS positive. CNS.  Alert and oriented .  No focal neurologic deficit. Extremities.  No edema, no cyanosis, pulses intact and symmetrical.  Condition at discharge: stable  The results of significant diagnostics from this hospitalization (including imaging, microbiology, ancillary and laboratory) are listed below for reference.   Imaging Studies: DG Chest Port 1 View Result Date: 01/06/2024 CLINICAL DATA:  73 year old female with fever, hyperglycemia. EXAM: PORTABLE  CHEST 1 VIEW COMPARISON:  Portable chest 01/04/2024 and earlier. FINDINGS: Portable AP semi upright view at 0503 hours. Improved lung volumes. Normal cardiac size and mediastinal contours. Stable trachea. Compared to 12/31/2023 there is now patchy, symmetric bibasilar lung opacity. Pulmonary vascularity appears increased but no overt edema. No pneumothorax or air bronchograms. No acute osseous abnormality identified.  IMPRESSION: Improved lung volumes since 01/04/2024.  Mild basilar atelectasis. Electronically Signed   By: Marlise Simpers M.D.   On: 01/06/2024 08:19   CT HEAD WO CONTRAST ( ) Result Date: 01/05/2024 CLINICAL DATA:  Altered mental status EXAM: CT HEAD WITHOUT CONTRAST TECHNIQUE: Contiguous axial images were obtained from the base of the skull through the vertex without intravenous contrast. RADIATION DOSE REDUCTION: This exam was performed according to the departmental dose-optimization program which includes automated exposure control, adjustment of the mA and/or kV according to patient size and/or use of iterative reconstruction technique. COMPARISON:  10/28/2022 FINDINGS: Brain: No mass,hemorrhage or extra-axial collection. Normal appearance of the parenchyma and CSF spaces. Vascular: No hyperdense vessel or unexpected vascular calcification. Skull: The visualized skull base, calvarium and extracranial soft tissues are normal. Sinuses/Orbits: Right maxillary chronic sinusitis.  Normal orbits. Other: None. IMPRESSION: 1. No acute intracranial abnormality. 2. Chronic right maxillary sinusitis. Electronically Signed   By: Juanetta Nordmann M.D.   On: 01/05/2024 01:02   ECHOCARDIOGRAM COMPLETE Result Date: 01/04/2024    ECHOCARDIOGRAM REPORT   Patient Name:   YOONA BALLOWE Date of Exam: 01/04/2024 Medical Rec #:  [id number]      Height:       63.0 in Accession #:    [id number]     Weight:       260.0 lb Date of Birth:  May 16, 1951      BSA:          2.162 m Patient Age:    72 years       BP:           124/80  mmHg Patient Gender: F              HR:           96 bpm. Exam Location:  Inpatient Procedure: 2D Echo, Color Doppler, Cardiac Doppler and Intracardiac            Opacification Agent (Both Spectral and Color Flow Doppler were            utilized during procedure). Indications:    Abnormal ECG, Elevated Troponin  History:        Patient has prior history of Echocardiogram examinations.                 Stroke, Signs/Symptoms:Dyspnea and Chest Pain; Risk                 Factors:Hypertension, Former Smoker and Diabetes.  Sonographer:    Willey Harrier Referring Phys: [id number] [facility]  Sonographer Comments: Patient is obese and Technically difficult study due to poor echo windows. IMPRESSIONS  1. Left ventricular ejection fraction, by estimation, is 55 to 60%. The left ventricle has normal function. The left ventricle demonstrates regional wall motion abnormalities (see scoring diagram/findings for description). There is mild asymmetric left ventricular hypertrophy of the basal-septal segment. Left ventricular diastolic parameters are consistent with Grade I diastolic dysfunction (impaired relaxation).  2. Right ventricular systolic function is mildly reduced. The right ventricular size is mildly enlarged.  3. The mitral valve is normal in structure. Trivial mitral valve regurgitation. No evidence of mitral stenosis.  4. The aortic valve is tricuspid. There is mild calcification of the aortic valve. Aortic valve regurgitation is not visualized. No aortic stenosis is present.  5. The inferior vena cava is normal in size with greater than 50% respiratory variability, suggesting right atrial pressure of 3 mmHg. FINDINGS  Left Ventricle: Left ventricular ejection fraction, by  estimation, is 55 to 60%. The left ventricle has normal function. The left ventricle demonstrates regional wall motion abnormalities. Definity  contrast agent was given IV to delineate the left ventricular endocardial borders. The left ventricular  internal cavity size was normal in size. There is mild asymmetric left ventricular hypertrophy of the basal-septal segment. Left ventricular diastolic parameters are consistent with Grade I diastolic dysfunction (impaired relaxation).  LV Wall Scoring: The mid inferoseptal segment and mid inferior segment are hypokinetic. Right Ventricle: The right ventricular size is mildly enlarged. No increase in right ventricular wall thickness. Right ventricular systolic function is mildly reduced. Left Atrium: Left atrial size was normal in size. Right Atrium: Right atrial size was normal in size. Pericardium: There is no evidence of pericardial effusion. Mitral Valve: The mitral valve is normal in structure. Trivial mitral valve regurgitation. No evidence of mitral valve stenosis. MV peak gradient, 6.9 mmHg. The mean mitral valve gradient is 3.0 mmHg. Tricuspid Valve: The tricuspid valve is not well visualized. Tricuspid valve regurgitation is trivial. No evidence of tricuspid stenosis. Aortic Valve: The aortic valve is tricuspid. There is mild calcification of the aortic valve. Aortic valve regurgitation is not visualized. No aortic stenosis is present. Aortic valve peak gradient measures 7.2 mmHg. Pulmonic Valve: The pulmonic valve was not well visualized. Pulmonic valve regurgitation is not visualized. No evidence of pulmonic stenosis. Aorta: The aortic root is normal in size and structure. Venous: The inferior vena cava is normal in size with greater than 50% respiratory variability, suggesting right atrial pressure of 3 mmHg. IAS/Shunts: No atrial level shunt detected by color flow Doppler.  LEFT VENTRICLE PLAX 2D LVIDd:         4.70 cm   Diastology LVIDs:         3.80 cm   LV e' medial:    10.00 cm/s LV PW:         0.90 cm   LV E/e' medial:  5.8 LV IVS:        0.90 cm   LV e' lateral:   9.36 cm/s LVOT diam:     2.10 cm   LV E/e' lateral: 6.2 LV SV:         56 LV SV Index:   26 LVOT Area:     3.46 cm  IVC IVC diam: 2.00  cm LEFT ATRIUM             Index        RIGHT ATRIUM          Index LA Vol (A2C):   31.9 ml 14.76 ml/m  RA Area:     9.59 cm LA Vol (A4C):   23.8 ml 11.01 ml/m  RA Volume:   16.80 ml 7.77 ml/m LA Biplane Vol: 27.9 ml 12.91 ml/m  AORTIC VALVE AV Area (Vmax): 2.19 cm AV Vmax:        134.00 cm/s AV Peak Grad:   7.2 mmHg LVOT Vmax:      84.90 cm/s LVOT Vmean:     61.300 cm/s LVOT VTI:       0.161 m  AORTA Ao Root diam: 3.30 cm Ao Asc diam:  3.30 cm MITRAL VALVE MV Area (PHT): 4.17 cm     SHUNTS MV Area VTI:   2.53 cm     Systemic VTI:  0.16 m MV Peak grad:  6.9 mmHg     Systemic Diam: 2.10 cm MV Mean grad:  3.0 mmHg MV Vmax:  1.31 m/s MV Vmean:      73.8 cm/s MV Decel Time: 182 msec MV E velocity: 58.10 cm/s MV A velocity: 119.00 cm/s MV E/A ratio:  0.49 Jules Oar MD Electronically signed by Jules Oar MD Signature Date/Time: 01/04/2024/2:39:57 PM    Final    DG Chest Port 1 View Result Date: 01/04/2024 CLINICAL DATA:  Dyspnea EXAM: PORTABLE CHEST 1 VIEW COMPARISON:  December 31, 2023 FINDINGS: Mild bilateral reticular interstitial prominence correlate with chronic interstitial lung disease perhaps superimposed mild bilateral congestive changes with basilar hypoventilatory atelectasis without significant pleural effusions No consolidation to indicate pneumonia Heart and mediastinum within normal limits. IMPRESSION: Mild bilateral reticular interstitial prominence correlate with chronic interstitial lung disease perhaps superimposed mild bilateral congestive changes with basilar hypoventilatory atelectasis without significant pleural effusions. Electronically Signed   By: Fredrich Jefferson M.D.   On: 01/04/2024 10:13   DG Hip Unilat With Pelvis 2-3 Views Right Result Date: 12/31/2023 CLINICAL DATA:  Fall. Found down at home by family. On floor for 2 days. EXAM: DG HIP (WITH OR WITHOUT PELVIS) 2-3V RIGHT COMPARISON:  Pelvis and right hip radiographs 12/29/2023 FINDINGS: Moderate bilateral  femoroacetabular joint space narrowing. No acute fracture. Mild bilateral sacroiliac subchondral sclerosis. The pubic symphysis joint space is maintained. 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 No acute fracture. Electronically Signed   By: Bertina Broccoli M.D.   On: 12/31/2023 18:19   DG Foot Complete Left Result Date: 12/31/2023 CLINICAL DATA:  Fall. Found at home by family in prone position. Down for 2 days. EXAM: LEFT FOOT - COMPLETE 3+ VIEW COMPARISON:  Left tibia and fibula radiographs 03/03/2023 FINDINGS: Mild hallux valgus. Old healed fracture of the distal shaft of the fourth metatarsal with mild medial displacement of the distal fracture component with respect to the proximal fracture component. Old healed fracture of the fifth metatarsal with approximately 4 mm dorsal displacement and minimal medial displacement of the distal fracture component with respect to the proximal fracture component. Tiny posterior calcaneal heel spur. No acute fracture or dislocation. IMPRESSION: 1. No acute fracture. 2. Old healed fractures of the fourth and fifth metatarsals. Electronically Signed   By: Bertina Broccoli M.D.   On: 12/31/2023 18:18   DG Chest Port 1 View Result Date: 12/31/2023 CLINICAL DATA:  Fall. Found at home by family in prone position. Patient reports she has been on the floor for 2 days. EXAM: PORTABLE CHEST 1 VIEW COMPARISON:  AP chest 12/23/2022, 10/28/2022 FINDINGS: The heart size and mediastinal contours are within normal limits. Both lungs are clear. The visualized skeletal structures are unremarkable. IMPRESSION: No active disease. Electronically Signed   By: Bertina Broccoli M.D.   On: 12/31/2023 18:06   DG Hip Unilat With Pelvis 2-3 Views Right Result Date: 12/29/2023 CLINICAL DATA:  Status post fall. EXAM: DG HIP (WITH OR WITHOUT PELVIS) 2-3V RIGHT COMPARISON:  None Available. FINDINGS: There is no evidence of hip fracture or dislocation. There is no evidence of  arthropathy or other focal bone abnormality. IMPRESSION: Negative. Electronically Signed   By: Virgle Grime M.D.   On: 12/29/2023 22:25   DG Knee Complete 4 Views Right Result Date: 12/29/2023 CLINICAL DATA:  Status post fall. EXAM: RIGHT KNEE - COMPLETE 4+ VIEW COMPARISON:  None Available. FINDINGS: No evidence of fracture, dislocation, or joint effusion. No evidence of arthropathy or other focal bone abnormality. Soft tissues are unremarkable. IMPRESSION: Negative. Electronically Signed   By: Virgle Grime M.D.   On: 12/29/2023 22:23  Microbiology: Results for orders placed or performed during the hospital encounter of 12/31/23  Culture, blood (Routine X 2) w Reflex to ID Panel     Status: Abnormal   Collection Time: 01/06/24 12:45 AM   Specimen: BLOOD RIGHT ARM  Result Value Ref Range Status   Specimen Description   Final    BLOOD RIGHT ARM Performed at [facility], [address] [place], [place] [address]    Special Requests   Final    BOTTLES DRAWN AEROBIC ONLY Blood Culture results may not be optimal due to an inadequate volume of blood received in culture bottles Performed at [facility], [address] [place], [place] [address]    Culture  Setup Time   Final    GRAM NEGATIVE RODS AEROBIC BOTTLE ONLY CRITICAL RESULT CALLED TO, READ BACK BY AND VERIFIED WITH: PHARMD MICHELLE BELL [id number] AT 1846 BY EC Performed at [facility], [address] [place], [place] [address]    Culture ENTEROBACTER CLOACAE (A)  Final   Report Status 01/08/2024 FINAL  Final   Organism ID, Bacteria ENTEROBACTER CLOACAE  Final      Susceptibility   Enterobacter cloacae - MIC*    CEFEPIME  <=0.12 SENSITIVE Sensitive     CEFTAZIDIME <=1 SENSITIVE Sensitive     CIPROFLOXACIN  <=0.25 SENSITIVE Sensitive     GENTAMICIN <=1 SENSITIVE Sensitive     IMIPENEM <=0.25 SENSITIVE Sensitive     TRIMETH/SULFA <=20 SENSITIVE Sensitive     PIP/TAZO <=4 SENSITIVE  Sensitive ug/mL    * ENTEROBACTER CLOACAE  Culture, blood (Routine X 2) w Reflex to ID Panel     Status: Abnormal   Collection Time: 01/06/24 12:45 AM   Specimen: BLOOD RIGHT ARM  Result Value Ref Range Status   Specimen Description   Final    BLOOD RIGHT ARM Performed at [facility], [address], [place], [place] [address]    Special Requests   Final    BOTTLES DRAWN AEROBIC ONLY Blood Culture results may not be optimal due to an inadequate volume of blood received in culture bottles Performed at [facility], [address], [place], [place] [address]    Culture  Setup Time   Final    GRAM NEGATIVE RODS AEROBIC BOTTLE ONLY CRITICAL VALUE NOTED.  VALUE IS CONSISTENT WITH PREVIOUSLY REPORTED AND CALLED VALUE.    Culture (A)  Final    ENTEROBACTER CLOACAE SUSCEPTIBILITIES PERFORMED ON PREVIOUS CULTURE WITHIN THE LAST 5 DAYS. Performed at [facility], [address], [place], [place] [address]    Report Status 01/08/2024 FINAL  Final  Blood Culture ID Panel (Reflexed)     Status: Abnormal   Collection Time: 01/06/24 12:45 AM  Result Value Ref Range Status   Enterococcus faecalis NOT DETECTED NOT DETECTED Final   Enterococcus Faecium NOT DETECTED NOT DETECTED Final   Listeria monocytogenes NOT DETECTED NOT DETECTED Final   Staphylococcus species NOT DETECTED NOT DETECTED Final   Staphylococcus aureus (BCID) NOT DETECTED NOT DETECTED Final   Staphylococcus epidermidis NOT DETECTED NOT DETECTED Final   Staphylococcus lugdunensis NOT DETECTED NOT DETECTED Final   Streptococcus species NOT DETECTED NOT DETECTED Final   Streptococcus agalactiae NOT DETECTED NOT DETECTED Final   Streptococcus pneumoniae NOT DETECTED NOT DETECTED Final   Streptococcus pyogenes NOT DETECTED NOT DETECTED Final   A.calcoaceticus-baumannii NOT DETECTED NOT DETECTED Final   Bacteroides fragilis NOT DETECTED NOT DETECTED Final  Enterobacterales DETECTED (A) NOT DETECTED  Final    Comment: Enterobacterales represent a large order of gram negative bacteria, not a single organism. CRITICAL RESULT CALLED TO, READ BACK BY AND VERIFIED WITH: PHARMD MICHELLE BELL [id number] AT 1846 BY EC    Enterobacter cloacae complex DETECTED (A) NOT DETECTED Final    Comment: CRITICAL RESULT CALLED TO, READ BACK BY AND VERIFIED WITH: PHARMD MICHELLE BELL [id number] AT 1846 B Y EC    Escherichia coli NOT DETECTED NOT DETECTED Final   Klebsiella aerogenes NOT DETECTED NOT DETECTED Final   Klebsiella oxytoca NOT DETECTED NOT DETECTED Final   Klebsiella pneumoniae NOT DETECTED NOT DETECTED Final   Proteus species NOT DETECTED NOT DETECTED Final   Salmonella species NOT DETECTED NOT DETECTED Final   Serratia marcescens NOT DETECTED NOT DETECTED Final   Haemophilus influenzae NOT DETECTED NOT DETECTED Final   Neisseria meningitidis NOT DETECTED NOT DETECTED Final   Pseudomonas aeruginosa NOT DETECTED NOT DETECTED Final   Stenotrophomonas maltophilia NOT DETECTED NOT DETECTED Final   Candida albicans NOT DETECTED NOT DETECTED Final   Candida auris NOT DETECTED NOT DETECTED Final   Candida glabrata NOT DETECTED NOT DETECTED Final   Candida krusei NOT DETECTED NOT DETECTED Final   Candida parapsilosis NOT DETECTED NOT DETECTED Final   Candida tropicalis NOT DETECTED NOT DETECTED Final   Cryptococcus neoformans/gattii NOT DETECTED NOT DETECTED Final   CTX-M ESBL NOT DETECTED NOT DETECTED Final   Carbapenem resistance IMP NOT DETECTED NOT DETECTED Final   Carbapenem resistance KPC NOT DETECTED NOT DETECTED Final   Carbapenem resistance NDM NOT DETECTED NOT DETECTED Final   Carbapenem resist OXA 48 LIKE NOT DETECTED NOT DETECTED Final   Carbapenem resistance VIM NOT DETECTED NOT DETECTED Final    Comment: Performed at [facility], [address] [place], [place] [address]    Labs: CBC: Recent Labs  Lab 01/02/24 [phone] 01/04/24 [id number] 01/05/24 0344 01/06/24 0412  01/08/24 0328  WBC 7.1 13.0* 18.0* 13.2* 8.4  NEUTROABS 4.5  --   --   --   --   HGB 11.8* 12.2 11.1* 11.1* 10.0*  HCT 38.5 39.1 34.8* 35.7* 32.5*  MCV 98.7 97.3 95.6 97.5 97.3  PLT 175 193 173 156 169   Basic Metabolic Panel: Recent Labs  Lab 01/04/24 0927 01/05/24 0344 01/06/24 0412 01/07/24 0402 01/08/24 0328  NA 139 136 137 136 136  K 3.8 3.3* 3.4* 3.4* 3.7  CL 100 95* 101 101 104  CO2 30 31 27 25 25   GLUCOSE 130* 106* 84 98 65*  BUN 15 17 19 17 16   CREATININE 1.53* 2.03* 1.68* 1.43* 1.35*  CALCIUM  9.2 9.0 8.4* 8.2* 8.0*  MG  --   --   --  2.0  --    Liver Function Tests: No results for input(s): "AST", "ALT", "ALKPHOS", "BILITOT", "PROT", "ALBUMIN " in the last 168 hours. CBG: Recent Labs  Lab 01/07/24 0746 01/07/24 1213 01/07/24 1634 01/07/24 2022 01/08/24 0749  GLUCAP 77 116* 122* 115* 83    Discharge time spent: greater than 30 minutes.  This record has been created using Conservation officer, historic buildings. Errors have been sought and corrected,but may not always be located. Such creation errors do not reflect on the standard of care.   Signed: Luna Salinas, MD [facility] 01/08/2024

## 2024-01-09 ENCOUNTER — Emergency Department (HOSPITAL_COMMUNITY)
Admission: EM | Admit: 2024-01-09 | Discharge: 2024-01-11 | Disposition: A | Discharge status: Skilled Nursing Facility | Attending: Emergency Medicine | Admitting: Emergency Medicine

## 2024-01-09 ENCOUNTER — Other Ambulatory Visit: Payer: Self-pay

## 2024-01-09 ENCOUNTER — Encounter (HOSPITAL_COMMUNITY): Payer: Self-pay | Admitting: Emergency Medicine

## 2024-01-09 ENCOUNTER — Emergency Department (HOSPITAL_COMMUNITY)

## 2024-01-09 DIAGNOSIS — I509 Heart failure, unspecified: Secondary | ICD-10-CM | POA: Diagnosis not present

## 2024-01-09 DIAGNOSIS — Z794 Long term (current) use of insulin: Secondary | ICD-10-CM | POA: Diagnosis not present

## 2024-01-09 DIAGNOSIS — E1165 Type 2 diabetes mellitus with hyperglycemia: Secondary | ICD-10-CM

## 2024-01-09 DIAGNOSIS — I2510 Atherosclerotic heart disease of native coronary artery without angina pectoris: Secondary | ICD-10-CM | POA: Insufficient documentation

## 2024-01-09 DIAGNOSIS — M25551 Pain in right hip: Secondary | ICD-10-CM | POA: Diagnosis not present

## 2024-01-09 DIAGNOSIS — F5105 Insomnia due to other mental disorder: Secondary | ICD-10-CM

## 2024-01-09 DIAGNOSIS — S3993XA Unspecified injury of pelvis, initial encounter: Secondary | ICD-10-CM | POA: Diagnosis not present

## 2024-01-09 DIAGNOSIS — Z7982 Long term (current) use of aspirin: Secondary | ICD-10-CM | POA: Insufficient documentation

## 2024-01-09 DIAGNOSIS — J440 Chronic obstructive pulmonary disease with acute lower respiratory infection: Secondary | ICD-10-CM | POA: Diagnosis not present

## 2024-01-09 DIAGNOSIS — F99 Mental disorder, not otherwise specified: Secondary | ICD-10-CM

## 2024-01-09 DIAGNOSIS — M25559 Pain in unspecified hip: Secondary | ICD-10-CM | POA: Diagnosis not present

## 2024-01-09 DIAGNOSIS — F411 Generalized anxiety disorder: Secondary | ICD-10-CM

## 2024-01-09 LAB — CBG MONITORING, ED: Glucose-Capillary: 69 mg/dL — ABNORMAL LOW (ref 70–99)

## 2024-01-09 MED ORDER — ROSUVASTATIN CALCIUM 20 MG PO TABS
10.0000 mg | ORAL_TABLET | Freq: Every day | ORAL | Status: DC
  Administered 2024-01-09 – 2024-01-10 (×2): 10 mg via ORAL
  Filled 2024-01-09 (×2): qty 1

## 2024-01-09 MED ORDER — ALBUTEROL SULFATE HFA 108 (90 BASE) MCG/ACT IN AERS: 1.0000 | INHALATION_SPRAY | Freq: Four times a day (QID) | RESPIRATORY_TRACT | Status: DC | PRN

## 2024-01-09 MED ORDER — INSULIN GLARGINE-YFGN 100 UNIT/ML ~~LOC~~ SOLN
40.0000 [IU] | Freq: Every day | SUBCUTANEOUS | Status: DC
  Filled 2024-01-09: qty 0.4

## 2024-01-09 MED ORDER — OXYCODONE-ACETAMINOPHEN 5-325 MG PO TABS
1.0000 | ORAL_TABLET | Freq: Once | ORAL | Status: AC
  Administered 2024-01-09: 1 via ORAL
  Filled 2024-01-09: qty 1

## 2024-01-09 MED ORDER — LEVOTHYROXINE SODIUM 100 MCG PO TABS
100.0000 ug | ORAL_TABLET | Freq: Every day | ORAL | Status: DC
  Administered 2024-01-10 – 2024-01-11 (×2): 100 ug via ORAL
  Filled 2024-01-09 (×2): qty 1

## 2024-01-09 MED ORDER — INSULIN ASPART 100 UNIT/ML IJ SOLN
0.0000 [IU] | Freq: Three times a day (TID) | INTRAMUSCULAR | Status: DC
  Administered 2024-01-10: 2 [IU] via SUBCUTANEOUS
  Filled 2024-01-09: qty 0.15

## 2024-01-09 MED ORDER — ZOLPIDEM TARTRATE 5 MG PO TABS
5.0000 mg | ORAL_TABLET | Freq: Every evening | ORAL | Status: DC | PRN
  Administered 2024-01-10: 5 mg via ORAL
  Filled 2024-01-09: qty 1

## 2024-01-09 MED ORDER — ALBUTEROL SULFATE (2.5 MG/3ML) 0.083% IN NEBU: 2.5000 mg | INHALATION_SOLUTION | Freq: Four times a day (QID) | RESPIRATORY_TRACT | Status: DC | PRN

## 2024-01-09 MED ORDER — PANTOPRAZOLE SODIUM 40 MG PO TBEC
40.0000 mg | DELAYED_RELEASE_TABLET | Freq: Two times a day (BID) | ORAL | Status: DC
  Administered 2024-01-09 – 2024-01-10 (×3): 40 mg via ORAL
  Filled 2024-01-09 (×3): qty 1

## 2024-01-09 MED ORDER — PREGABALIN 50 MG PO CAPS
100.0000 mg | ORAL_CAPSULE | Freq: Every day | ORAL | Status: DC
  Administered 2024-01-09 – 2024-01-10 (×2): 100 mg via ORAL
  Filled 2024-01-09 (×2): qty 2

## 2024-01-09 MED ORDER — OXYCODONE HCL 5 MG PO TABS: 5.0000 mg | ORAL_TABLET | Freq: Four times a day (QID) | ORAL | Status: DC | PRN

## 2024-01-09 MED ORDER — CIPROFLOXACIN HCL 500 MG PO TABS
500.0000 mg | ORAL_TABLET | Freq: Two times a day (BID) | ORAL | Status: DC
  Administered 2024-01-09 – 2024-01-11 (×4): 500 mg via ORAL
  Filled 2024-01-09 (×4): qty 1

## 2024-01-09 MED ORDER — METOPROLOL SUCCINATE ER 50 MG PO TB24
50.0000 mg | ORAL_TABLET | Freq: Every day | ORAL | Status: DC
  Administered 2024-01-09 – 2024-01-10 (×2): 50 mg via ORAL
  Filled 2024-01-09 (×2): qty 1

## 2024-01-09 MED ORDER — INSULIN ASPART 100 UNIT/ML IJ SOLN
0.0000 [IU] | Freq: Every day | INTRAMUSCULAR | Status: DC
  Filled 2024-01-09: qty 0.05

## 2024-01-09 NOTE — Progress Notes (Signed)
 CSW spoke with patient and the patients daughter Ruthanne Covey. CSW was told that patient did not decline SNF and that patient did not understand the situation. The patient is now agreeing to go to SNF and knows she needs extra assistance the family cannot provide.

## 2024-01-09 NOTE — ED Notes (Signed)
Pt given sandwich and orange juice

## 2024-01-09 NOTE — ED Provider Notes (Signed)
 [facility] AT [facility] Provider Note   CSN: [id number] Arrival date & time: 01/09/24  1645     History  Chief Complaint  Patient presents with   Hip Pain    Sue [name] is a 73 y.o. female.   Hip Pain  Patient presents for hip pain.  Medical history includes neuropathy, CAD, CHF.  She was admitted 9 days ago after a fall with prolonged downtime.  She met sepsis criteria on arrival and was started on antibiotics.  Her blood cultures grew Enterobacter cloacae.  She received cefepime  while in the hospital and was discharged on 1 week of ciprofloxacin .  While in hospital, she was also treated for AKI, hypokalemia, and rhabdomyolysis.  She is on 3 to 4 L of oxygen  at baseline.  Prior to discharge yesterday, she underwent PT and OT evaluation.  They recommended short-term rehab but patient declined, preferring to undergo home.  Since she has been home, she has been unable to perform ADLs.  She states that she has had ongoing right hip pain since her fall 9 days ago.  She denies any new falls or any changes in her pain.  She is agreeable to rehab facility placement at this time.     Home Medications Prior to Admission medications   Medication Sig Start Date End Date Taking? Authorizing Provider  Accu-Chek Softclix Lancets lancets USE 1  TO CHECK GLUCOSE 4 TIMES DAILY 01/16/23   Crecencio Dodge, Candida Chalk, DO  albuterol  (VENTOLIN  HFA) 108 (90 Base) MCG/ACT inhaler Inhale 1-2 puffs into the lungs every 6 (six) hours as needed for wheezing or shortness of breath. 12/23/22   Teddi Favors, DO  allopurinol  (ZYLOPRIM ) 100 MG tablet Take 100 mg by mouth daily. 06/10/21   [provider]  aspirin  EC 81 MG tablet Take 1 tablet (81 mg total) by mouth daily. Swallow whole. 01/08/24   Luna Salinas, MD  Blood Glucose Monitoring Suppl (ACCU-CHEK GUIDE) w/Device KIT USE   TO CHECK GLUCOSE UP TO 4 TIMES DAILY AS DIRECTED 11/11/22   Crecencio Dodge, Candida Chalk, DO  busPIRone   (BUSPAR ) 15 MG tablet Take 1 tablet (15 mg total) by mouth 2 (two) times daily. 04/24/23   Lincoln Renshaw, NP  ciprofloxacin  (CIPRO ) 500 MG tablet Take 1 tablet (500 mg total) by mouth 2 (two) times daily for 7 days. 01/08/24 01/15/24  Amin, Sumayya, MD  Dulaglutide  (TRULICITY ) 3 MG/0.5ML SOAJ Inject 3 mg into the skin once a week. 08/22/23   Shamleffer, Ibtehal Jaralla, MD  empagliflozin  (JARDIANCE ) 10 MG TABS tablet Take 1 tablet (10 mg total) by mouth daily before breakfast. 08/21/23   Shamleffer, Ibtehal Jaralla, MD  famotidine  (PEPCID ) 20 MG tablet Take 20 mg by mouth daily as needed for heartburn or indigestion.    [provider]  glucose blood (ACCU-CHEK GUIDE) test strip USE 1 STRIP TO CHECK GLUCOSE 4 TIMES DAILY 11/04/22   Crecencio Dodge, Candida Chalk, DO  ibuprofen  (ADVIL ) 200 MG tablet Take 400 mg by mouth every 6 (six) hours as needed for moderate pain (pain score 4-6).    [provider]  insulin  aspart (NOVOLOG  FLEXPEN) 100 UNIT/ML FlexPen Novolog  10 units with each meal PLUS the scale if needed -Novolog  correctional insulin : ADD extra units on insulin  to your meal-time Novolog  dose if your blood sugars are higher than 160. Use the scale below to help guide you:  Blood sugar before meal Number of units to inject Less than 160  0 unit 161 -  190 1 units 191 -  220 2 units 221 -  250 3 units 251 -  280 4 units 281 -  310 5 units 311 -  340 6 units 341 -  370 7 units 371 -  400 8 units Max daily 45 units 08/22/23   Shamleffer, Ibtehal Jaralla, MD  insulin  glargine, 1 Unit Dial, (TOUJEO  SOLOSTAR) 300 UNIT/ML Solostar Pen INJECT 64 UNITS SUBCUTANEOUSLY ONCE DAILY IN THE AFTERNOON.**APPOINTMENT NEEDED FOR FURTHER REFILLS** Patient taking differently: 74 Units daily. 09/18/23   Shamleffer, Ibtehal Jaralla, MD  Insulin  Pen Needle (BD PEN NEEDLE NANO U/F) 32G X 4 MM MISC Use to inject insulin  08/22/23   Shamleffer, Ibtehal Jaralla, MD  levothyroxine  (SYNTHROID ) 100 MCG tablet Take 1 tablet (100  mcg total) by mouth daily before breakfast. 08/22/23   Shamleffer, Ibtehal Jaralla, MD  lidocaine  (LIDODERM ) 5 % Place 1 patch onto the skin daily. Remove & Discard patch within 12 hours or as directed by MD Patient not taking: Reported on 12/31/2023 12/29/23   Kingsley, Victoria K, DO  LORazepam  (ATIVAN ) 1 MG tablet TAKE 1/2 TO 1 AND 1/2 TABLETS AT BEDTIME AND AN ADDITIONAL 1/2 TAB ONCE A DAY AS NEEDED FOR ANXIETY Patient taking differently: Take 0.5-1.5 mg by mouth See admin instructions. Take 0.5mg  (1/2 tablet) to 1.5mg  (1 and 1/2 tablet) by mouth at night as needed for anxiety/sleep. May take an additional 0.5mg  (1/2 tablet) once a day as needed for anxiety. 11/13/23   Mozingo, Regina Nattalie, NP  magic mouthwash w/lidocaine  SOLN Take 5 mLs by mouth 4 (four) times daily as needed for mouth pain. Swish and Spit Patient not taking: Reported on 12/31/2023 12/28/23   Crecencio Dodge, Candida Chalk, DO  metoprolol  succinate (TOPROL -XL) 50 MG 24 hr tablet Take 2 tablets (100 mg total) by mouth daily. Take with or immediately following a meal Patient taking differently: Take 50 mg by mouth daily. Take with or immediately following a meal 10/17/23   Crecencio Dodge, Candida Chalk, DO  oxyCODONE  (ROXICODONE ) 5 MG immediate release tablet Take 1 tablet (5 mg total) by mouth every 6 (six) hours as needed. Patient not taking: Reported on 12/31/2023 12/29/23   Kingsley, Victoria K, DO  OXYGEN  Inhale 3-4 L/min into the lungs continuous.     [provider]  pantoprazole  (PROTONIX ) 40 MG tablet Take 1 tablet (40 mg total) by mouth 2 (two) times daily. 01/08/24   Amin, Sumayya, MD  potassium chloride  SA (KLOR-CON  M) 20 MEQ tablet Take 1 tablet (20 mEq total) by mouth daily. 10/31/23   Estill Hemming, DO  pregabalin  (LYRICA ) 100 MG capsule Take 1 capsule (100 mg total) by mouth at bedtime. 01/08/24   Luna Salinas, MD  rosuvastatin  (CRESTOR ) 10 MG tablet TAKE 1 TABLET BY MOUTH ONCE DAILY FOR HEART HEALTH AND TO LOWER  CHOLESTEROL 09/18/23   Crecencio Dodge, Yvonne R, DO  senna-docusate (SENOKOT-S) 8.6-50 MG tablet Take 1 tablet by mouth daily. Patient not taking: Reported on 12/31/2023 12/29/23   Kingsley, Victoria K, DO  torsemide  (DEMADEX ) 20 MG tablet Take 3 tablets (60 mg total) by mouth daily. 07/28/23   Estill Hemming, DO  zolpidem  (AMBIEN  CR) 12.5 MG CR tablet TAKE 1 TABLET BY MOUTH AT BEDTIME AS NEEDED FOR SLEEP Patient taking differently: Take 12.5 mg by mouth at bedtime. for sleep 11/13/23   Mozingo, Regina Nattalie, NP      Allergies    Hydrocodone , Norvasc  [amlodipine  besylate], and  Tizanidine     Review of Systems   Review of Systems  Musculoskeletal:  Positive for arthralgias.  All other systems reviewed and are negative.   Physical Exam Updated Vital Signs BP 137/71 (BP Location: Right Arm)   Pulse 71   Temp 97.9 F (36.6 C) (Oral)   Resp 18   SpO2 100%  Physical Exam Vitals and nursing note reviewed.  Constitutional:      General: She is not in acute distress.    Appearance: Normal appearance. She is well-developed. She is not ill-appearing, toxic-appearing or diaphoretic.  HENT:     Head: Normocephalic and atraumatic.     Right Ear: External ear normal.     Left Ear: External ear normal.     Nose: Nose normal.     Mouth/Throat:     Mouth: Mucous membranes are moist.  Eyes:     Extraocular Movements: Extraocular movements intact.     Conjunctiva/sclera: Conjunctivae normal.  Cardiovascular:     Rate and Rhythm: Normal rate and regular rhythm.  Pulmonary:     Effort: Pulmonary effort is normal. No respiratory distress.  Abdominal:     General: There is no distension.     Palpations: Abdomen is soft.  Musculoskeletal:        General: No swelling.     Cervical back: Normal range of motion and neck supple.  Skin:    General: Skin is warm and dry.     Coloration: Skin is not jaundiced or pale.  Neurological:     General: No focal deficit present.     Mental Status: She  is alert and oriented to person, place, and time.  Psychiatric:        Mood and Affect: Mood normal.        Behavior: Behavior normal.     ED Results / Procedures / Treatments   Labs (all labs ordered are listed, but only abnormal results are displayed) Labs Reviewed - No data to display  EKG None  Radiology CT PELVIS WO CONTRAST Result Date: 01/09/2024 CLINICAL DATA:  Hip trauma with fracture suspected.  X-ray done. EXAM: CT PELVIS WITHOUT CONTRAST TECHNIQUE: Multidetector CT imaging of the pelvis was performed following the standard protocol without intravenous contrast. RADIATION DOSE REDUCTION: This exam was performed according to the departmental dose-optimization program which includes automated exposure control, adjustment of the mA and/or kV according to patient size and/or use of iterative reconstruction technique. COMPARISON:  Pelvis and hip radiographs 12/31/2023 FINDINGS: Urinary Tract:  No abnormality visualized. Bowel:  Unremarkable visualized pelvic bowel loops. Vascular/Lymphatic: Vascular calcifications. No aneurysms. No significant lymphadenopathy. Reproductive:  No mass or other significant abnormality Other:  No free air or free fluid demonstrated in the pelvis. Musculoskeletal: Degenerative changes in the lower lumbar spine. Degenerative changes in the SI joints. No evidence of acute fracture or dislocation. No focal bone lesion or bone destruction. Bone cortex appears intact. IMPRESSION: 1. No acute bony abnormalities. No evidence of fracture or dislocation of the hips. 2. No acute process demonstrated in the soft tissue pelvis. Electronically Signed   By: Boyce Byes M.D.   On: 01/09/2024 19:00    Procedures Procedures    Medications Ordered in ED Medications  albuterol  (VENTOLIN  HFA) 108 (90 Base) MCG/ACT inhaler 1-2 puff (has no administration in time range)  ciprofloxacin  (CIPRO ) tablet 500 mg (has no administration in time range)  levothyroxine  (SYNTHROID )  tablet 100 mcg (has no administration in time range)  metoprolol  succinate (TOPROL -XL) 24 hr  tablet 50 mg (50 mg Oral Given 01/09/24 1746)  oxyCODONE  (Oxy IR/ROXICODONE ) immediate release tablet 5 mg (has no administration in time range)  pantoprazole  (PROTONIX ) EC tablet 40 mg (has no administration in time range)  pregabalin  (LYRICA ) capsule 100 mg (has no administration in time range)  rosuvastatin  (CRESTOR ) tablet 10 mg (10 mg Oral Given 01/09/24 1746)  insulin  glargine-yfgn (SEMGLEE ) injection 40 Units (has no administration in time range)  insulin  aspart (novoLOG ) injection 0-15 Units (has no administration in time range)  insulin  aspart (novoLOG ) injection 0-5 Units (has no administration in time range)  oxyCODONE -acetaminophen  (PERCOCET/ROXICET) 5-325 MG per tablet 1 tablet (1 tablet Oral Given 01/09/24 1746)    ED Course/ Medical Decision Making/ A&P                                 Medical Decision Making Amount and/or Complexity of Data Reviewed Radiology: ordered.  Risk Prescription drug management.   Patient presenting for right hip pain, ongoing since a fall 9 days ago.  This does severely limit her function at home.  She was hospitalized up until yesterday.  She declined rehab placement at time of discharge, but has reconsidered and does request rehab placement at this time.  Patient's only complaint is her ongoing right hip pain.  Roxicodone  ordered.  Given her recent discharge with no new complaints since that time, she is medically cleared.  TOC consult placed.  Home medications ordered.  Per chart review, she has undergone right hip x-rays that were negative.  Given her ongoing pain, will obtain CT of pelvis to assess for occult fracture.  CT did not show any occult injuries.  I spoke with social work who request repeat physical therapy eval and treat.  This was ordered.  Diet and insulin  regimen ordered.        Final Clinical Impression(s) / ED Diagnoses Final  diagnoses:  Right hip pain    Rx / DC Orders ED Discharge Orders     None         Iva Mariner, MD 01/09/24 [phone]

## 2024-01-09 NOTE — ED Triage Notes (Signed)
 Patient BIB EMS from home c/o hip pain.  Per report patient lives by herself at home and unable to do ADL's after discharge yesteday in the hospital. Per report family wants patient to be in a facility.  BP 118/70 HR 75 RR 20 O2sat 98% On 4L  CBG 157

## 2024-01-09 NOTE — [place] FL2 (Signed)
 [place]  MEDICAID FL2 LEVEL OF CARE FORM     IDENTIFICATION  Patient Name: Sue [name] Birthdate: [birth year] Sex: female Admission Date (Current Location): 01/09/2024  [facility] and [place] Number:  Producer, television/film/video and Address:  [facility],  [address], [place] [address]      Provider Number: [id number]  Attending Physician Name and Address:  System, Provider Not In  Relative Name and Phone Number:  Ruthanne Covey (Daughter) [phone]    Current Level of Care: Hospital Recommended Level of Care: Skilled Nursing Facility Prior Approval Number:    Date Approved/Denied:   PASRR Number: [id number]  Discharge Plan: SNF    Current Diagnoses: Patient Active Problem List   Diagnosis Date Noted   Bacteremia 01/08/2024   Benign hypertension 01/06/2024   Chronic heart failure with preserved ejection fraction (HFpEF) (HCC) 01/06/2024   Non-ST elevation (NSTEMI) myocardial infarction (HCC) 01/04/2024   Rhabdomyolysis 12/31/2023   Acute encephalopathy 09/02/2022   Hypotension 09/02/2022   Type 2 diabetes mellitus with hyperglycemia, with long-term current use of insulin  (HCC) 04/29/2022   Neuropathy 03/14/2022   Lower extremity edema 07/16/2021   Need for influenza vaccination 07/16/2021   Type 2 diabetes mellitus with stage 3b chronic kidney disease, with long-term current use of insulin  (HCC) 05/18/2021   Type 2 diabetes mellitus with diabetic polyneuropathy, with long-term current use of insulin  (HCC) 05/18/2021   Urine incontinence 05/10/2021   Rash 04/05/2021   Palpitation 04/05/2021   Acute bronchitis due to COVID-19 virus 03/09/2021   Hyperlipidemia 02/01/2021   Left knee pain 02/01/2021   Palpitations 02/01/2021   Chronic pain of both shoulders 05/29/2020   Chronic diastolic heart failure (HCC) 01/08/2020   CKD (chronic kidney disease) stage 3, GFR 30-59 ml/min (HCC) 11/13/2019   Acute blood loss anemia 11/13/2019   Lower GI bleed  08/16/2019   Symptomatic anemia 08/03/2019   QT prolongation 08/03/2019   GI bleed 08/02/2019   Diabetes mellitus with renal complications (HCC) 05/29/2019   Hyperlipidemia associated with type 2 diabetes mellitus (HCC) 05/29/2019   DVT (deep venous thrombosis) (HCC) 03/17/2019   Acute on chronic renal insufficiency 03/17/2019   Macrocytic anemia 03/17/2019   Small bowel obstruction (HCC) 03/08/2019   Fracture of left tibia 02/25/2019   Left tibial fracture 02/25/2019   Hyperglycemia 08/16/2018   AKI (acute kidney injury) (HCC) 08/15/2018   Morbid obesity due to excess calories (HCC) 05/09/2018   Acute bronchitis with COPD (HCC) 10/19/2017   Atypical chest pain 05/01/2017   Pulmonary emphysema (HCC) 04/06/2017   Chronic seasonal allergic rhinitis 04/06/2017   GERD (gastroesophageal reflux disease) 04/06/2017   Snoring 04/06/2017   Myalgia 02/01/2017   Arthralgia 02/01/2017   Chronic pain of toe of left foot 11/14/2016   Chronic hepatitis C without hepatic coma (HCC) 08/01/2016   Hypothyroidism 10/29/2015   Mild diastolic dysfunction 01/01/2015   Edema 01/01/2015   Edema of both ankles 12/24/2014   Stroke (HCC) 2016   TIA (transient ischemic attack) 07/30/2014   Weakness 07/29/2014   Flank pain 11/12/2013   Chronic respiratory failure (HCC) 10/25/2013   DOE (dyspnea on exertion) 10/25/2013   Depression with anxiety 10/15/2013   Insomnia 10/15/2013   Essential hypertension 10/09/2013   Hypokalemia 10/09/2013    Orientation RESPIRATION BLADDER Height & Weight     Self, Time, Place  O2 (4L) Continent Weight:  260 lbs Height:  5'3   BEHAVIORAL SYMPTOMS/MOOD NEUROLOGICAL BOWEL NUTRITION STATUS      Continent Diet (Carb  Modified)  AMBULATORY STATUS COMMUNICATION OF NEEDS Skin   Extensive Assist Verbally Normal                       Personal Care Assistance Level of Assistance    Bathing Assistance: Limited assistance Feeding assistance: Independent Dressing  Assistance: Limited assistance     Functional Limitations Info    Sight Info: Adequate Hearing Info: Adequate Speech Info: Adequate    SPECIAL CARE FACTORS FREQUENCY                       Contractures Contractures Info: Not present    Additional Factors Info  Code Status, Allergies Code Status Info: Full Allergies Info: Hydrocodone , Norvasc  (Amlodipine  Besylate), Tizanidine            Current Medications (01/09/2024):  This is the current hospital active medication list Current Facility-Administered Medications  Medication Dose Route Frequency Provider Last Rate Last Admin   albuterol  (VENTOLIN  HFA) 108 (90 Base) MCG/ACT inhaler 1-2 puff  1-2 puff Inhalation Q6H PRN Iva Mariner, MD       ciprofloxacin  (CIPRO ) tablet 500 mg  500 mg Oral BID Iva Mariner, MD   500 mg at 01/09/24 2002   [START ON 01/10/2024] insulin  aspart (novoLOG ) injection 0-15 Units  0-15 Units Subcutaneous TID WC Iva Mariner, MD       insulin  aspart (novoLOG ) injection 0-5 Units  0-5 Units Subcutaneous QHS Iva Mariner, MD       insulin  glargine-yfgn (SEMGLEE ) injection 40 Units  40 Units Subcutaneous QHS Iva Mariner, MD       Cecily Cohen ON 01/10/2024] levothyroxine  (SYNTHROID ) tablet 100 mcg  100 mcg Oral QAC breakfast Iva Mariner, MD       metoprolol  succinate (TOPROL -XL) 24 hr tablet 50 mg  50 mg Oral Daily Iva Mariner, MD   50 mg at 01/09/24 1746   oxyCODONE  (Oxy IR/ROXICODONE ) immediate release tablet 5 mg  5 mg Oral Q6H PRN Iva Mariner, MD       pantoprazole  (PROTONIX ) EC tablet 40 mg  40 mg Oral BID Iva Mariner, MD       pregabalin  (LYRICA ) capsule 100 mg  100 mg Oral QHS Iva Mariner, MD       rosuvastatin  (CRESTOR ) tablet 10 mg  10 mg Oral Daily Iva Mariner, MD   10 mg at 01/09/24 1746   Current Outpatient Medications  Medication Sig Dispense Refill   Accu-Chek Softclix Lancets lancets USE 1  TO CHECK GLUCOSE 4 TIMES DAILY 100 each 2   albuterol  (VENTOLIN  HFA) 108 (90 Base) MCG/ACT inhaler Inhale 1-2  puffs into the lungs every 6 (six) hours as needed for wheezing or shortness of breath. 1 each 0   allopurinol  (ZYLOPRIM ) 100 MG tablet Take 100 mg by mouth daily.     aspirin  EC 81 MG tablet Take 1 tablet (81 mg total) by mouth daily. Swallow whole. 30 tablet 12   Blood Glucose Monitoring Suppl (ACCU-CHEK GUIDE) w/Device KIT USE   TO CHECK GLUCOSE UP TO 4 TIMES DAILY AS DIRECTED 1 kit 0   busPIRone  (BUSPAR ) 15 MG tablet Take 1 tablet (15 mg total) by mouth 2 (two) times daily. 60 tablet 3   ciprofloxacin  (CIPRO ) 500 MG tablet Take 1 tablet (500 mg total) by mouth 2 (two) times daily for 7 days. 14 tablet 0   Dulaglutide  (TRULICITY ) 3 MG/0.5ML SOAJ Inject 3 mg into the skin once a week. 6 mL 3   empagliflozin  (  JARDIANCE ) 10 MG TABS tablet Take 1 tablet (10 mg total) by mouth daily before breakfast. 90 tablet 3   famotidine  (PEPCID ) 20 MG tablet Take 20 mg by mouth daily as needed for heartburn or indigestion.     glucose blood (ACCU-CHEK GUIDE) test strip USE 1 STRIP TO CHECK GLUCOSE 4 TIMES DAILY 200 each 12   ibuprofen  (ADVIL ) 200 MG tablet Take 400 mg by mouth every 6 (six) hours as needed for moderate pain (pain score 4-6).     insulin  aspart (NOVOLOG  FLEXPEN) 100 UNIT/ML FlexPen Novolog  10 units with each meal PLUS the scale if needed -Novolog  correctional insulin : ADD extra units on insulin  to your meal-time Novolog  dose if your blood sugars are higher than 160. Use the scale below to help guide you:  Blood sugar before meal Number of units to inject Less than 160 0 unit 161 -  190 1 units 191 -  220 2 units 221 -  250 3 units 251 -  280 4 units 281 -  310 5 units 311 -  340 6 units 341 -  370 7 units 371 -  400 8 units Max daily 45 units 45 mL 3   insulin  glargine, 1 Unit Dial, (TOUJEO  SOLOSTAR) 300 UNIT/ML Solostar Pen INJECT 64 UNITS SUBCUTANEOUSLY ONCE DAILY IN THE AFTERNOON.**APPOINTMENT NEEDED FOR FURTHER REFILLS** (Patient taking differently: 74 Units daily.) 6 mL 1   Insulin  Pen Needle (BD  PEN NEEDLE NANO U/F) 32G X 4 MM MISC Use to inject insulin  200 each 3   levothyroxine  (SYNTHROID ) 100 MCG tablet Take 1 tablet (100 mcg total) by mouth daily before breakfast. 90 tablet 3   lidocaine  (LIDODERM ) 5 % Place 1 patch onto the skin daily. Remove & Discard patch within 12 hours or as directed by MD (Patient not taking: Reported on 12/31/2023) 30 patch 0   LORazepam  (ATIVAN ) 1 MG tablet TAKE 1/2 TO 1 AND 1/2 TABLETS AT BEDTIME AND AN ADDITIONAL 1/2 TAB ONCE A DAY AS NEEDED FOR ANXIETY (Patient taking differently: Take 0.5-1.5 mg by mouth See admin instructions. Take 0.5mg  (1/2 tablet) to 1.5mg  (1 and 1/2 tablet) by mouth at night as needed for anxiety/sleep. May take an additional 0.5mg  (1/2 tablet) once a day as needed for anxiety.) 60 tablet 3   magic mouthwash w/lidocaine  SOLN Take 5 mLs by mouth 4 (four) times daily as needed for mouth pain. Swish and Spit (Patient not taking: Reported on 12/31/2023) 240 mL 0   metoprolol  succinate (TOPROL -XL) 50 MG 24 hr tablet Take 2 tablets (100 mg total) by mouth daily. Take with or immediately following a meal (Patient taking differently: Take 50 mg by mouth daily. Take with or immediately following a meal) 60 tablet 0   oxyCODONE  (ROXICODONE ) 5 MG immediate release tablet Take 1 tablet (5 mg total) by mouth every 6 (six) hours as needed. (Patient not taking: Reported on 12/31/2023) 9 tablet 0   OXYGEN  Inhale 3-4 L/min into the lungs continuous.      pantoprazole  (PROTONIX ) 40 MG tablet Take 1 tablet (40 mg total) by mouth 2 (two) times daily. 60 tablet 1   potassium chloride  SA (KLOR-CON  M) 20 MEQ tablet Take 1 tablet (20 mEq total) by mouth daily. 90 tablet 0   pregabalin  (LYRICA ) 100 MG capsule Take 1 capsule (100 mg total) by mouth at bedtime. 30 capsule 0   rosuvastatin  (CRESTOR ) 10 MG tablet TAKE 1 TABLET BY MOUTH ONCE DAILY FOR HEART HEALTH AND TO LOWER CHOLESTEROL 90  tablet 0   senna-docusate (SENOKOT-S) 8.6-50 MG tablet Take 1 tablet by mouth  daily. (Patient not taking: Reported on 12/31/2023) 30 tablet 0   torsemide  (DEMADEX ) 20 MG tablet Take 3 tablets (60 mg total) by mouth daily. 90 tablet 0   zolpidem  (AMBIEN  CR) 12.5 MG CR tablet TAKE 1 TABLET BY MOUTH AT BEDTIME AS NEEDED FOR SLEEP (Patient taking differently: Take 12.5 mg by mouth at bedtime. for sleep) 30 tablet 3     Discharge Medications: Please see discharge summary for a list of discharge medications.  Relevant Imaging Results:  Relevant Lab Results:   Additional Information SSN: [id number]  Ane Keener, LCSWA

## 2024-01-10 LAB — CBG MONITORING, ED
Glucose-Capillary: 107 mg/dL — ABNORMAL HIGH (ref 70–99)
Glucose-Capillary: 137 mg/dL — ABNORMAL HIGH (ref 70–99)
Glucose-Capillary: 108 mg/dL — ABNORMAL HIGH (ref 70–99)
Glucose-Capillary: 178 mg/dL — ABNORMAL HIGH (ref 70–99)

## 2024-01-10 MED ORDER — BUSPIRONE HCL 10 MG PO TABS
15.0000 mg | ORAL_TABLET | Freq: Two times a day (BID) | ORAL | Status: DC
  Administered 2024-01-10 (×2): 15 mg via ORAL
  Filled 2024-01-10 (×2): qty 2

## 2024-01-10 MED ORDER — QUETIAPINE FUMARATE 100 MG PO TABS
200.0000 mg | ORAL_TABLET | Freq: Every day | ORAL | Status: DC
  Administered 2024-01-10: 200 mg via ORAL
  Filled 2024-01-10: qty 2

## 2024-01-10 MED ORDER — INSULIN GLARGINE-YFGN 100 UNIT/ML ~~LOC~~ SOLN: 40.0000 [IU] | Freq: Every day | SUBCUTANEOUS | Status: DC

## 2024-01-10 MED ORDER — LORAZEPAM 1 MG PO TABS
1.0000 mg | ORAL_TABLET | Freq: Every day | ORAL | Status: DC
  Administered 2024-01-10: 1 mg via ORAL
  Filled 2024-01-10: qty 1

## 2024-01-10 MED ORDER — INSULIN GLARGINE-YFGN 100 UNIT/ML ~~LOC~~ SOLN
74.0000 [IU] | Freq: Every day | SUBCUTANEOUS | Status: DC
  Filled 2024-01-10: qty 0.74

## 2024-01-10 MED ORDER — INSULIN ASPART 100 UNIT/ML IJ SOLN
0.0000 [IU] | Freq: Three times a day (TID) | INTRAMUSCULAR | Status: DC
  Filled 2024-01-10: qty 0.15

## 2024-01-10 MED ORDER — INSULIN ASPART 100 UNIT/ML IJ SOLN
6.0000 [IU] | Freq: Three times a day (TID) | INTRAMUSCULAR | Status: DC
  Filled 2024-01-10: qty 0.06

## 2024-01-10 MED ORDER — ASPIRIN 81 MG PO TBEC
81.0000 mg | DELAYED_RELEASE_TABLET | Freq: Every day | ORAL | Status: DC
  Administered 2024-01-10: 81 mg via ORAL
  Filled 2024-01-10: qty 1

## 2024-01-10 NOTE — Evaluation (Signed)
 Physical Therapy Evaluation Patient Details Name: Sue [name] MRN: [id number] DOB: 1951-01-30 Today's Date: 01/10/2024  History of Present Illness  73 y.o. female  DC from [facility] 01/08/24 with diagnosis of rhabdomyolysis, returns to ED 01/09/24 due to inability to take care of self. . PMH: T2D, depression, hypothyroidism, prior stroke, morbid obesity, HTN, COPD  Clinical Impression  Pt admitted with above diagnosis.  Pt currently with functional limitations due to the deficits listed below (see PT Problem List). Pt will benefit from acute skilled PT to increase their independence and safety with mobility to allow discharge.       The patient is very pleasant. Daughter present and very helpful with patient's care.  Patient able to   move to sitting and standing, able to take a few steps along the   stretcher. Patient should be able to improve in mobility and self care with continued therapy. Patient agreeable to post acute rehab. Patient will benefit from continued inpatient follow up therapy, <3 hours/day     If plan is discharge home, recommend the following: A lot of help with walking and/or transfers;A lot of help with bathing/dressing/bathroom;Assistance with cooking/housework;Assist for transportation;Help with stairs or ramp for entrance   Can travel by private vehicle   No    Equipment Recommendations None recommended by PT  Recommendations for Other Services       Functional Status Assessment Patient has had a recent decline in their functional status and demonstrates the ability to make significant improvements in function in a reasonable and predictable amount of time.     Precautions / Restrictions Precautions Precautions: Fall Recall of Precautions/Restrictions: Intact Precaution/Restrictions Comments: Oxygen  dep 3 L Restrictions Weight Bearing Restrictions Per Provider Order: No      Mobility  Bed Mobility   Bed Mobility: Supine to Sit, Sit to Supine Rolling: Min  assist   Supine to sit: Min assist, HOB elevated, Used rails Sit to supine: Mod assist   General bed mobility comments: Patient able to move to sitting  with no assistance, assist legs back onto stretcher    Transfers Overall transfer level: Needs assistance Equipment used: Rolling walker (2 wheels) Transfers: Sit to/from Stand Sit to Stand: Min assist   Step pivot transfers: Min assist       General transfer comment: stands at Rw, side steps to [facility] using RW, noted antalgic on the  right  leg    Ambulation/Gait                  Stairs            Wheelchair Mobility     Tilt Bed    Modified Rankin (Stroke Patients Only)       Balance Overall balance assessment: Needs assistance Sitting-balance support: No upper extremity supported, Feet supported Sitting balance-Leahy Scale: Fair     Standing balance support: During functional activity, Bilateral upper extremity supported, Reliant on assistive device for balance Standing balance-Leahy Scale: Fair                               Pertinent Vitals/Pain Pain Assessment Faces Pain Scale: Hurts whole lot Pain Location: right hip and back Pain Descriptors / Indicators: Tender, Grimacing, Constant Pain Intervention(s): Monitored during session    Home Living Family/patient expects to be discharged to:: Skilled nursing facility Living Arrangements: Alone Available Help at Discharge: Family;Available PRN/intermittently Type of Home: Apartment Home Access: Level entry  Home Layout: One level Home Equipment: Rollator (4 wheels);Grab bars - tub/shower;Hand held shower head;Shower seat;Other (comment) Additional Comments: O2 dependent at baseline    Prior Function Prior Level of Function : History of Falls (last six months);Independent/Modified Independent             Mobility Comments: reports 2 recent falls; ADLs Comments: independent with meals, dtr does groceries, hired PCA 2x  per week for cleaning and laundry     Extremity/Trunk Assessment   Upper Extremity Assessment Upper Extremity Assessment: Overall WFL for tasks assessed    Lower Extremity Assessment Lower Extremity Assessment: Generalized weakness    Cervical / Trunk Assessment Cervical / Trunk Assessment: Other exceptions Cervical / Trunk Exceptions: body habitus  Communication   Communication Communication: No apparent difficulties    Cognition Arousal: Alert Behavior During Therapy: WFL for tasks assessed/performed   PT - Cognitive impairments: No apparent impairments                         Following commands: Intact Following commands impaired: Follows one step commands with increased time     Cueing Cueing Techniques: Verbal cues     General Comments      Exercises     Assessment/Plan    PT Assessment Patient needs continued PT services  PT Problem List Decreased strength;Decreased range of motion;Decreased activity tolerance;Decreased balance;Decreased mobility;Pain       PT Treatment Interventions DME instruction;Gait training;Functional mobility training;Therapeutic activities;Therapeutic exercise;Patient/family education;Balance training    PT Goals (Current goals can be found in the Care Plan section)  Acute Rehab PT Goals Patient Stated Goal: go to rehab PT Goal Formulation: With patient/family Time For Goal Achievement: 01/24/24 Potential to Achieve Goals: Good    Frequency Min 2X/week     Co-evaluation               AM-PAC PT "6 Clicks" Mobility  Outcome Measure Help needed turning from your back to your side while in a flat bed without using bedrails?: A Little Help needed moving from lying on your back to sitting on the side of a flat bed without using bedrails?: A Little Help needed moving to and from a bed to a chair (including a wheelchair)?: A Lot Help needed standing up from a chair using your arms (e.g., wheelchair or bedside  chair)?: A Lot Help needed to walk in hospital room?: Total Help needed climbing 3-5 steps with a railing? : Total 6 Click Score: 12    End of Session Equipment Utilized During Treatment: Oxygen  Activity Tolerance: Patient limited by fatigue Patient left: in bed;with family/visitor present Nurse Communication: Mobility status PT Visit Diagnosis: Other abnormalities of gait and mobility (R26.89)    Time: 3086-5784 PT Time Calculation (min) (ACUTE ONLY): 24 min   Charges:   PT Evaluation $PT Eval Low Complexity: 1 Low PT Treatments $Therapeutic Activity: 8-22 mins PT General Charges $$ ACUTE PT VISIT: 1 Visit         Abelina Hoes PT Acute Rehabilitation Services Office [phone] Weekend [phone]   Dareen Ebbing 01/10/2024, 9:05 AM

## 2024-01-10 NOTE — ED Provider Notes (Signed)
 Patient here pending TOC/SNF placement and PT eval  Discharged from hospital yesterday and had reportedly refused SNF at the time (although this appears to be disputed in more recent notes) and returned needing placement and help  Home meds ordered including cipro    Sue Latus Janalyn Me, MD 01/10/24 [phone]

## 2024-01-10 NOTE — ED Notes (Signed)
 Patient able to use the bedpan

## 2024-01-10 NOTE — Progress Notes (Addendum)
 CSW spoke with [facility] home aide with DSS who reported family wishes for patient to now go to [facility] and Rehab for STR at [facility]. CSW confirmed with pt's daughter, Justine Oms. CSW notified rep at [facility] and updated insurance auth to reflect desired SNF. [facility] is pending for [employer].   Notified Rhonda at [place] of change.  Addend @ 4:08PM Ricky with [facility] approved pt's auth while this Clinical research associate updated facility. Notified [facility] an Rehab - awaiting response regarding admit.

## 2024-01-10 NOTE — ED Provider Notes (Signed)
 Diabetes coordinator RN requesting Semglee  dosing adjustment to 40 mg at bedtime and I have amended duplicated insulin  TID orders with meals   Arvilla Birmingham, MD 01/10/24 1119

## 2024-01-10 NOTE — Progress Notes (Addendum)
 CSW presented bed offers to patient's daughter, Justine Oms. Justine Oms will review facilities and let this writer know. TOC following.   Addend @ 10:48AM Daughter and patient chose [facility]. [facility] started.

## 2024-01-10 NOTE — Inpatient Diabetes Management (Signed)
 Inpatient Diabetes Program Recommendations  AACE/ADA: New Consensus Statement on Inpatient Glycemic Control   Target Ranges:  Prepandial:   less than 140 mg/dL      Peak postprandial:   less than 180 mg/dL (1-2 hours)      Critically ill patients:  140 - 180 mg/dL    Latest Reference Range & Units 01/09/24 21:40 01/10/24 08:58  Glucose-Capillary 70 - 99 mg/dL 69 (L) 981 (H)    Review of Glycemic Control  Diabetes history: DM2 Outpatient Diabetes medications: Trulicity  3 mg Qweek, Novolog  10 units TID with meals plus SSI, Toujeo  74 units daily Current orders for Inpatient glycemic control: Semglee  74 units daily, Novolog  0-15 units TID, Novolog  0-15 units TID, Novolog  0-5 units at bedtime, Novolog  6 units TID with meals  Inpatient Diabetes Program Recommendations:    Insulin : Please decrease Semglee  to 40 units and change frequency to QHS, discontinue one of the Novolog  0-15 units TID with meals, and Novolog  6 units TID with meals.  NOTE: Patient was recently inpatient 4/20-4/28/25 and was last ordered Semglee  45 units daily and Novolog  0-15 units TID with meals and Novolog  0-5 units for DM control. Patient presented back to ED on 01/09/24 and now holding in ED for placement.    Thanks, Beacher Limerick, RN, MSN, CDCES Diabetes Coordinator Inpatient Diabetes Program [phone] (Team Pager from 8am to 5pm)

## 2024-01-11 DIAGNOSIS — Z743 Need for continuous supervision: Secondary | ICD-10-CM | POA: Diagnosis not present

## 2024-01-11 DIAGNOSIS — M25551 Pain in right hip: Secondary | ICD-10-CM | POA: Diagnosis not present

## 2024-01-11 DIAGNOSIS — M25572 Pain in left ankle and joints of left foot: Secondary | ICD-10-CM | POA: Diagnosis not present

## 2024-01-11 LAB — CBG MONITORING, ED: Glucose-Capillary: 110 mg/dL — ABNORMAL HIGH (ref 70–99)

## 2024-01-11 MED ORDER — QUETIAPINE FUMARATE 200 MG PO TABS
200.0000 mg | ORAL_TABLET | Freq: Every day | ORAL | 0 refills | Status: DC
Start: 2024-01-11 — End: 2024-02-12

## 2024-01-11 MED ORDER — ZOLPIDEM TARTRATE ER 12.5 MG PO TBCR: 12.5000 mg | EXTENDED_RELEASE_TABLET | Freq: Every evening | ORAL | 3 refills | Status: DC | PRN

## 2024-01-11 MED ORDER — SENNOSIDES-DOCUSATE SODIUM 8.6-50 MG PO TABS: 1.0000 | ORAL_TABLET | Freq: Every day | ORAL | 0 refills | Status: AC

## 2024-01-11 MED ORDER — ROSUVASTATIN CALCIUM 10 MG PO TABS: 10.0000 mg | ORAL_TABLET | Freq: Every day | ORAL | 0 refills | Status: DC

## 2024-01-11 MED ORDER — PREGABALIN 100 MG PO CAPS: 100.0000 mg | ORAL_CAPSULE | Freq: Every day | ORAL | 0 refills | Status: DC

## 2024-01-11 MED ORDER — LEVOTHYROXINE SODIUM 100 MCG PO TABS: 100.0000 ug | ORAL_TABLET | Freq: Every day | ORAL | 3 refills | Status: AC

## 2024-01-11 MED ORDER — CIPROFLOXACIN HCL 500 MG PO TABS: 500.0000 mg | ORAL_TABLET | Freq: Two times a day (BID) | ORAL | 0 refills | Status: AC

## 2024-01-11 MED ORDER — PANTOPRAZOLE SODIUM 40 MG PO TBEC: 40.0000 mg | DELAYED_RELEASE_TABLET | Freq: Two times a day (BID) | ORAL | 1 refills | Status: AC

## 2024-01-11 MED ORDER — ASPIRIN 81 MG PO TBEC: 81.0000 mg | DELAYED_RELEASE_TABLET | Freq: Every day | ORAL | 12 refills | Status: AC

## 2024-01-11 MED ORDER — TRULICITY 3 MG/0.5ML ~~LOC~~ SOAJ: 3.0000 mg | SUBCUTANEOUS | 3 refills | Status: AC

## 2024-01-11 MED ORDER — METOPROLOL SUCCINATE ER 50 MG PO TB24: 100.0000 mg | ORAL_TABLET | Freq: Every day | ORAL | 0 refills | Status: DC

## 2024-01-11 MED ORDER — TOUJEO SOLOSTAR 300 UNIT/ML ~~LOC~~ SOPN: 40.0000 [IU] | PEN_INJECTOR | Freq: Every day | SUBCUTANEOUS | 1 refills | Status: DC

## 2024-01-11 MED ORDER — NOVOLOG FLEXPEN 100 UNIT/ML ~~LOC~~ SOPN: PEN_INJECTOR | SUBCUTANEOUS | 3 refills | Status: AC

## 2024-01-11 MED ORDER — LORAZEPAM 1 MG PO TABS: ORAL_TABLET | ORAL | 3 refills | Status: DC

## 2024-01-11 MED ORDER — ALBUTEROL SULFATE HFA 108 (90 BASE) MCG/ACT IN AERS: 1.0000 | INHALATION_SPRAY | Freq: Four times a day (QID) | RESPIRATORY_TRACT | 0 refills | Status: DC | PRN

## 2024-01-11 MED ORDER — BUSPIRONE HCL 15 MG PO TABS: 15.0000 mg | ORAL_TABLET | Freq: Two times a day (BID) | ORAL | 3 refills | Status: DC

## 2024-01-11 MED ORDER — LIDOCAINE 5 % EX PTCH: 1.0000 | MEDICATED_PATCH | CUTANEOUS | 0 refills | Status: DC

## 2024-01-11 NOTE — Progress Notes (Signed)
 Patient will discharge to [facility] this morning. Daughter notified. EDP and RN made aware via secure chat. [facility] to transport. Report info given to RN. No additional TOC needs.

## 2024-01-11 NOTE — ED Notes (Signed)
 Report given to Polly Brink at [facility].

## 2024-01-11 NOTE — ED Provider Notes (Addendum)
 Emergency Medicine Observation Re-evaluation Note  Sue [name] is a 73 y.o. female, seen on rounds today.  Pt initially presented to the ED for complaints of Hip Pain Currently, the patient is resting comfortably.  Physical Exam  BP (!) 144/71   Pulse 72   Temp 98.3 F (36.8 C) (Oral)   Resp 18   SpO2 95%  Physical Exam General: NAD Cardiac: good peripheral perfusion Lungs: bilateral chest rise Psych: resting comfortably  ED Course / MDM  EKG:   I have reviewed the labs performed to date as well as medications administered while in observation.  Recent changes in the last 24 hours include n/a.  Plan  Current plan is for social work placement. Patient has been accepted into a facility.  She remained stable for transfer.     Albertus Hughs, DO 01/11/24 [phone]

## 2024-01-12 ENCOUNTER — Telehealth: Payer: Self-pay

## 2024-01-12 DIAGNOSIS — I5032 Chronic diastolic (congestive) heart failure: Secondary | ICD-10-CM

## 2024-01-12 DIAGNOSIS — W19XXXA Unspecified fall, initial encounter: Secondary | ICD-10-CM

## 2024-01-12 DIAGNOSIS — R200 Anesthesia of skin: Secondary | ICD-10-CM

## 2024-01-12 DIAGNOSIS — R0902 Hypoxemia: Secondary | ICD-10-CM

## 2024-01-12 NOTE — Telephone Encounter (Signed)
 Copied from CRM [id number]. Topic: Clinical - Medical Advice >> Jan 12, 2024  1:51 PM Magdalene School wrote: Reason for CRM: patient stated that she got a physical therapy order from the hospital when she was there and they called her yesterday 01/11/24 but patient is unsure where she got the call from. She is ready to start the physical therapy and would like a call back regarding that.

## 2024-01-12 NOTE — Telephone Encounter (Signed)
 I see in the hospital note that patient denied PT and short term rehab and don't see that referral was placed. Okay to place?? And I need the correct dx please

## 2024-01-15 NOTE — Addendum Note (Signed)
 Addended by: Ysidro Her on: 01/15/2024 01:17 PM   Modules accepted: Orders

## 2024-01-15 NOTE — Telephone Encounter (Signed)
 Referral placed.

## 2024-01-17 ENCOUNTER — Other Ambulatory Visit: Payer: Self-pay | Admitting: Family Medicine

## 2024-01-17 ENCOUNTER — Telehealth: Payer: Self-pay

## 2024-01-17 DIAGNOSIS — R0609 Other forms of dyspnea: Secondary | ICD-10-CM

## 2024-01-17 DIAGNOSIS — R600 Localized edema: Secondary | ICD-10-CM

## 2024-01-17 MED ORDER — TORSEMIDE 20 MG PO TABS: 40.0000 mg | ORAL_TABLET | Freq: Every evening | ORAL | 0 refills | Status: DC

## 2024-01-17 NOTE — Telephone Encounter (Signed)
 Copied from CRM [id number]. Topic: General - Other >> Jan 17, 2024 11:18 AM Howard Macho wrote: Reason for CRM: Jenna from [facility] called stating there will be a delay in care. The daughter would like home health to start on 5/10  Jenna CB [phone] Jenna stated she does not need call back unless the doctor has questions

## 2024-01-17 NOTE — Telephone Encounter (Signed)
Medication no longer on med list. Please advise 

## 2024-01-17 NOTE — Telephone Encounter (Signed)
 Copied from CRM [id number]. Topic: Clinical - Medication Question >> Jan 17, 2024 11:36 AM Bambi Bonine D wrote: Reason for CRM: Patient's daughter Glady Laming is calling to check the status of the medication refill request for the Torsemide . Glady Laming stated that the patient took her last tablet last night and is currently out of medication. Glady Laming stated that the patient's feet and ankles are swollen and has fluids.

## 2024-01-17 NOTE — Telephone Encounter (Signed)
 fyi

## 2024-01-21 DIAGNOSIS — N1832 Chronic kidney disease, stage 3b: Secondary | ICD-10-CM | POA: Diagnosis not present

## 2024-01-21 DIAGNOSIS — I130 Hypertensive heart and chronic kidney disease with heart failure and stage 1 through stage 4 chronic kidney disease, or unspecified chronic kidney disease: Secondary | ICD-10-CM | POA: Diagnosis not present

## 2024-01-21 DIAGNOSIS — I214 Non-ST elevation (NSTEMI) myocardial infarction: Secondary | ICD-10-CM | POA: Diagnosis not present

## 2024-01-21 DIAGNOSIS — N179 Acute kidney failure, unspecified: Secondary | ICD-10-CM | POA: Diagnosis not present

## 2024-01-21 DIAGNOSIS — E1122 Type 2 diabetes mellitus with diabetic chronic kidney disease: Secondary | ICD-10-CM | POA: Diagnosis not present

## 2024-01-21 DIAGNOSIS — I5032 Chronic diastolic (congestive) heart failure: Secondary | ICD-10-CM | POA: Diagnosis not present

## 2024-01-21 DIAGNOSIS — G9341 Metabolic encephalopathy: Secondary | ICD-10-CM | POA: Diagnosis not present

## 2024-01-21 DIAGNOSIS — T796XXD Traumatic ischemia of muscle, subsequent encounter: Secondary | ICD-10-CM | POA: Diagnosis not present

## 2024-01-21 DIAGNOSIS — J439 Emphysema, unspecified: Secondary | ICD-10-CM | POA: Diagnosis not present

## 2024-01-22 ENCOUNTER — Encounter: Payer: Self-pay | Admitting: Family Medicine

## 2024-01-22 ENCOUNTER — Telehealth: Payer: Self-pay

## 2024-01-22 ENCOUNTER — Ambulatory Visit: Admitting: Family Medicine

## 2024-01-22 ENCOUNTER — Telehealth: Admitting: Family Medicine

## 2024-01-22 DIAGNOSIS — W19XXXA Unspecified fall, initial encounter: Secondary | ICD-10-CM | POA: Diagnosis not present

## 2024-01-22 DIAGNOSIS — R600 Localized edema: Secondary | ICD-10-CM | POA: Diagnosis not present

## 2024-01-22 DIAGNOSIS — G629 Polyneuropathy, unspecified: Secondary | ICD-10-CM | POA: Diagnosis not present

## 2024-01-22 DIAGNOSIS — J9611 Chronic respiratory failure with hypoxia: Secondary | ICD-10-CM

## 2024-01-22 MED ORDER — NONFORMULARY OR COMPOUNDED ITEM: 0 refills | Status: AC

## 2024-01-22 NOTE — Progress Notes (Deleted)
   Established Patient Office Visit  Subjective   Patient ID: Sue [name], female    DOB: 10/09/1950  Age: 73 y.o. MRN: [id number]  Chief Complaint  Patient presents with   Hospitalization Follow-up    HPI  {History (Optional):23778}  ROS    Objective:     There were no vitals taken for this visit. {Vitals History (Optional):23777}  Physical Exam   No results found for any visits on 01/22/24.  {Labs (Optional):23779}  The ASCVD Risk score (Arnett DK, et al., 2019) failed to calculate for the following reasons:   Risk score cannot be calculated because patient has a medical history suggesting prior/existing ASCVD    Assessment & Plan:   Problem List Items Addressed This Visit       Unprioritized   Neuropathy   Relevant Medications   NONFORMULARY OR COMPOUNDED ITEM   Chronic respiratory failure (HCC) (Chronic)   Relevant Medications   NONFORMULARY OR COMPOUNDED ITEM   Other Visit Diagnoses       Fall, initial encounter    -  Primary   Relevant Medications   NONFORMULARY OR COMPOUNDED ITEM       No follow-ups on file.    Nala Kachel R Lowne Chase, DO

## 2024-01-22 NOTE — Progress Notes (Signed)
 MyChart Video Visit    Virtual Visit via Video Note   This patient is at least at moderate risk for complications without adequate follow up. This format is felt to be most appropriate for this patient at this time. Physical exam was limited by quality of the video and audio technology used for the visit. [name] was able to get the patient set up on a video visit.  Patient location: home with daughter .    Patient and provider in visit Provider location: Office  I discussed the limitations of evaluation and management by telemedicine and the availability of in person appointments. The patient expressed understanding and agreed to proceed.  Visit Date: 01/22/2024  Today's healthcare provider: Estill Hemming, DO     Subjective:    Patient ID: Sue [name], female    DOB: 11-23-1950, 73 y.o.   MRN: [id number]  Chief Complaint  Patient presents with  . Hospitalization Follow-up    HPI Patient is in today for f/u mult hosp visits ------ home health requesting skilled nursing    .Discussed the use of AI scribe software for clinical note transcription with the patient, who gave verbal consent to proceed.  History of Present Illness [name] is a 73 year old female who presents with frequent falls and swelling in her legs. She is accompanied by her daughter, who is her primary caregiver.  She has experienced three falls recently, occurring in quick succession, leading to significant pain in her hip and thighs. The pain is described as very sore and aching, and she has been unable to get up after falling due to its severity. She attributes the thigh pain to the falls and has been using a walker with assistance but remains very weak and unable to walk unassisted.  She was recently hospitalized for about a week, during which she received fluids. Upon returning home, she noticed swelling in her ankles and feet. The swelling persists, particularly in one knee and the bottom  of her feet. She has been taking torasemide, initially at a dose of three tablets, which was reduced to two tablets upon discharge from the hospital. Her daughter reports that she has been taking two tablets, but she mentions using three tablets when she gets them herself.  She has been set up with home physical therapy and possibly other home health services through [facility]. A nurse from [place] has visited and suggested the need for a bedside commode, which her daughter is responsible for obtaining. She expresses concerns about the rehabilitation facility she was offered, describing it as having 'bunk beds' and a 'wood floor,' which she felt was unsafe, . She prefers to be at home despite the challenges.  She has been provided with an in-home life alert system, but there is uncertainty about whether she would use it in an emergency. Her daughter is concerned about her safety, especially given her lethargic state and the risk of falling when alone. Her daughter reports that she has been lethargic and not answering her phone, raising concerns about her ability to manage independently.    Past Medical History:  Diagnosis Date  . Allergic rhinitis   . Depression   . Diabetes mellitus type 2, uncontrolled   . Emphysema of lung (HCC)    3L home O2  . GERD (gastroesophageal reflux disease)   . Hypertension   . Hypothyroidism   . Obesity, morbid, BMI 50 or higher (HCC)   . Stroke [facility]) 2016  TIA   . Urine incontinence     Past Surgical History:  Procedure Laterality Date  . ABDOMINAL HYSTERECTOMY    . CESAREAN SECTION    . COLONOSCOPY N/A 08/19/2019   Procedure: COLONOSCOPY;  Surgeon: Ozell Blunt, MD;  [facility]: [facility];  Service: Endoscopy;  Laterality: N/A;  . COLONOSCOPY WITH PROPOFOL  N/A 08/05/2019   Procedure: COLONOSCOPY WITH PROPOFOL ;  Surgeon: Baldo Bonds, MD;  [facility];  Service: Gastroenterology;  Laterality: N/A;  . COLONOSCOPY WITH PROPOFOL  N/A 11/19/2019    Procedure: COLONOSCOPY WITH PROPOFOL ;  Surgeon: Lanita Pitman, MD;  [facility];  Service: Endoscopy;  Laterality: N/A;  Unprepped  . COLONOSCOPY WITH PROPOFOL  N/A 11/22/2019   Procedure: COLONOSCOPY WITH PROPOFOL ;  Surgeon: Lanita Pitman, MD;  [facility];  Service: Endoscopy;  Laterality: N/A;  . COLONOSCOPY WITH PROPOFOL  N/A 11/23/2019   Procedure: COLONOSCOPY WITH PROPOFOL ;  Surgeon: Genell Ken, MD;  [facility];  Service: Gastroenterology;  Laterality: N/A;  . COLONOSCOPY WITH PROPOFOL  N/A 11/29/2019   Procedure: COLONOSCOPY WITH PROPOFOL ;  Surgeon: Baldo Bonds, MD;  [facility];  Service: Endoscopy;  Laterality: N/A;  . ENTEROSCOPY N/A 11/24/2019   Procedure: ENTEROSCOPY;  Surgeon: Genell Ken, MD;  [facility];  Service: Gastroenterology;  Laterality: N/A;  . ENTEROSCOPY N/A 11/27/2019   Procedure: ENTEROSCOPY;  Surgeon: Baldo Bonds, MD;  [facility];  Service: Endoscopy;  Laterality: N/A;  . ESOPHAGOGASTRODUODENOSCOPY N/A 11/27/2019   Procedure: ESOPHAGOGASTRODUODENOSCOPY (EGD);  Surgeon: Baldo Bonds, MD;  Location: [facility];  Service: Endoscopy;  Laterality: N/A;  . ESOPHAGOGASTRODUODENOSCOPY (EGD) WITH PROPOFOL  N/A 11/24/2019   Procedure: ESOPHAGOGASTRODUODENOSCOPY (EGD) WITH PROPOFOL ;  Surgeon: Genell Ken, MD;  [facility];  Service: Gastroenterology;  Laterality: N/A;  PUSH enteroscopy  . GIVENS CAPSULE STUDY N/A 11/19/2019   Procedure: GIVENS CAPSULE STUDY;  Surgeon: Lanita Pitman, MD;  [facility];  Service: Endoscopy;  Laterality: N/A;  To be performed immediately following colonoscopy  . GIVENS CAPSULE STUDY N/A 11/24/2019   Procedure: GIVENS CAPSULE STUDY;  Surgeon: Genell Ken, MD;  [facility];  Service: Gastroenterology;  Laterality: N/A;  . GIVENS CAPSULE STUDY N/A 11/28/2019   Procedure: GIVENS CAPSULE STUDY;  Surgeon: Baldo Bonds, MD;  [facility];  Service: Endoscopy;  Laterality: N/A;  . HEMOSTASIS CLIP PLACEMENT  11/19/2019   Procedure: HEMOSTASIS CLIP PLACEMENT;  Surgeon: Lanita Pitman, MD;  [facility];  Service: Endoscopy;;  . HEMOSTASIS CLIP PLACEMENT  11/22/2019   Procedure: HEMOSTASIS CLIP PLACEMENT;  Surgeon: Lanita Pitman, MD;  [facility];  Service: Endoscopy;;  . HOT HEMOSTASIS N/A 11/24/2019   Procedure: HOT HEMOSTASIS (ARGON PLASMA COAGULATION/BICAP);  Surgeon: Genell Ken, MD;  Location: [facility];  Service: Gastroenterology;  Laterality: N/A;  . HOT HEMOSTASIS N/A 11/27/2019   Procedure: HOT HEMOSTASIS (ARGON PLASMA COAGULATION/BICAP);  Surgeon: Baldo Bonds, MD;  Location: [facility];  Service: Endoscopy;  Laterality: N/A;  . SUBMUCOSAL TATTOO INJECTION  11/19/2019   Procedure: SUBMUCOSAL TATTOO INJECTION;  Surgeon: Lanita Pitman, MD;  [facility];  Service: Endoscopy;;    Family History  Problem Relation Age of Onset  . Heart disease Father        MVP and Pics Valve  . Hypertension Father   . Depression Father        Institutionalized x's 2 years  . Bipolar disorder Father   . Hypertension Sister   . Diabetes Sister   . Hyperlipidemia Sister   . Heart disease Sister  52       MI  . Heart disease Brother   . Hypertension Brother   . Schizophrenia Paternal Aunt   . Depression Paternal Aunt   . Anxiety disorder Paternal Aunt   . Heart disease Paternal Aunt   . Schizophrenia Paternal Aunt   . Heart disease Paternal Uncle   . Heart disease Paternal Grandmother   . Asthma Son   . Asthma Son     Social History   Socioeconomic History  . Marital status: Divorced    Spouse name: Not on file  . Number of children: 4  . Years of education: 73  . Highest education level: Associate degree: academic program  Occupational History  . Occupation: disabled- [employer]  Tobacco Use  . Smoking status: Former    Current packs/day: 0.00    Average  packs/day: 1 pack/day for 40.0 years (40.0 ttl pk-yrs)    Types: Cigarettes    Start date: 07/21/1972    Quit date: 10/16/2011    Years since quitting: 12.2  . Smokeless tobacco: Never  Vaping Use  . Vaping status: Never Used  Substance and Sexual Activity  . Alcohol  use: Not Currently    Comment: Occ-- Wine  . Drug use: No  . Sexual activity: Not Currently  Other Topics Concern  . Not on file  Social History Narrative      Originally from [place]. Has also lived in [place], [place]. She also previously lived in [place] for 20 years. No history of Valley Fever. Moved to [place] in 1989. 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 No pets currently. No bird exposure. Questionable previous mold exposure in her daughter's home. 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       Lives in [place] alone in assisted living.  Has multiple chronic illness.      Are you right handed or left handed? Right handed   Are you currently employed ? no   What is your current occupation? retired   Do you live at home alone?no   Who lives with you? son   What type of home do you live in: 1 story or 2 story? Apartment first floor       Social Drivers of Health   Financial Resource Strain: Low Risk  (01/24/2022)   Overall Financial Resource Strain (CARDIA)   . Difficulty of Paying Living Expenses: Not hard at all  Food Insecurity: No Food Insecurity (12/31/2023)   Hunger Vital Sign   . Worried About Programme researcher, broadcasting/film/video in the Last Year: Never true   . Ran Out of Food in the Last Year: Never true  Transportation Needs: No Transportation Needs (12/31/2023)   PRAPARE - Transportation   . Lack of Transportation (Medical): No   . Lack of Transportation (Non-Medical): No  Physical Activity:  Inactive (07/04/2023)   Exercise Vital Sign   . Days of Exercise per Week: 0 days   . Minutes of Exercise per Session: 0 min  Stress: Stress Concern Present (07/04/2023)   [employer] of Occupational Health - Occupational Stress Questionnaire   . Feeling of Stress : To some extent  Social Connections: Unknown (01/02/2024)   Social Connection and Isolation Panel [NHANES]   . Frequency of Communication with  Friends and Family: Three times a week   . Frequency of Social Gatherings with Friends and Family: Three times a week   . Attends Religious Services: Patient declined   . Active Member of Clubs or Organizations: Yes   . Attends Banker Meetings: More than 4 times per year   . Marital Status: Divorced  Catering manager Violence: Not At Risk (12/31/2023)   Humiliation, Afraid, Rape, and Kick questionnaire   . Fear of Current or Ex-Partner: No   . Emotionally Abused: No   . Physically Abused: No   . Sexually Abused: No    Outpatient Medications Prior to Visit  Medication Sig Dispense Refill  . Accu-Chek Softclix Lancets lancets USE 1  TO CHECK GLUCOSE 4 TIMES DAILY 100 each 2  . albuterol  (VENTOLIN  HFA) 108 (90 Base) MCG/ACT inhaler Inhale 1-2 puffs into the lungs every 6 (six) hours as needed for wheezing or shortness of breath. 1 each 0  . aspirin  EC 81 MG tablet Take 1 tablet (81 mg total) by mouth daily. Swallow whole. 30 tablet 12  . Blood Glucose Monitoring Suppl (ACCU-CHEK GUIDE) w/Device KIT USE   TO CHECK GLUCOSE UP TO 4 TIMES DAILY AS DIRECTED 1 kit 0  . busPIRone  (BUSPAR ) 15 MG tablet Take 1 tablet (15 mg total) by mouth 2 (two) times daily. 60 tablet 3  . Dulaglutide  (TRULICITY ) 3 MG/0.5ML SOAJ Inject 3 mg into the skin once a week. 6 mL 3  . glucose blood (ACCU-CHEK GUIDE) test strip USE 1 STRIP TO CHECK GLUCOSE 4 TIMES DAILY 200 each 12  . insulin  aspart (NOVOLOG  FLEXPEN) 100 UNIT/ML FlexPen Novolog  10 units with each meal PLUS the scale if needed -Novolog   correctional insulin : ADD extra units on insulin  to your meal-time Novolog  dose if your blood sugars are higher than 160. Use the scale below to help guide you:  Blood sugar before meal Number of units to inject Less than 160 0 unit 161 -  190 1 units 191 -  220 2 units 221 -  250 3 units 251 -  280 4 units 281 -  310 5 units 311 -  340 6 units 341 -  370 7 units 371 -  400 8 units Max daily 45 units 45 mL 3  . insulin  glargine, 1 Unit Dial, (TOUJEO  SOLOSTAR) 300 UNIT/ML Solostar Pen Inject 40 Units into the skin at bedtime. 6 mL 1  . Insulin  Pen Needle (BD PEN NEEDLE NANO U/F) 32G X 4 MM MISC Use to inject insulin  200 each 3  . levothyroxine  (SYNTHROID ) 100 MCG tablet Take 1 tablet (100 mcg total) by mouth daily before breakfast. 90 tablet 3  . lidocaine  (LIDODERM ) 5 % Place 1 patch onto the skin daily. Remove & Discard patch within 12 hours or as directed by MD 30 patch 0  . LORazepam  (ATIVAN ) 1 MG tablet TAKE 1/2 TO 1 AND 1/2 TABLETS AT BEDTIME AND AN ADDITIONAL 1/2 TAB ONCE A DAY AS NEEDED FOR ANXIETY 60 tablet 3  . metoprolol  succinate (TOPROL -XL) 50 MG 24 hr tablet Take 2 tablets (100 mg total) by mouth daily. Take with or immediately following a meal 60 tablet 0  . OXYGEN  Inhale 3-4 L/min into the lungs continuous.     . pantoprazole  (PROTONIX ) 40 MG tablet Take 1 tablet (40 mg total) by mouth 2 (two) times daily. 60 tablet 1  . potassium chloride  SA (KLOR-CON  M) 20 MEQ tablet Take 1 tablet (20 mEq total) by  mouth daily. 90 tablet 0  . pregabalin  (LYRICA ) 100 MG capsule Take 1 capsule (100 mg total) by mouth at bedtime. 30 capsule 0  . QUEtiapine  (SEROQUEL ) 200 MG tablet Take 1 tablet (200 mg total) by mouth at bedtime. 30 tablet 0  . rosuvastatin  (CRESTOR ) 10 MG tablet Take 1 tablet (10 mg total) by mouth daily. 90 tablet 0  . senna-docusate (SENOKOT-S) 8.6-50 MG tablet Take 1 tablet by mouth daily. 30 tablet 0  . torsemide  (DEMADEX ) 20 MG tablet Take 2 tablets (40 mg total) by mouth every  evening. 60 tablet 0  . zolpidem  (AMBIEN  CR) 12.5 MG CR tablet Take 1 tablet (12.5 mg total) by mouth at bedtime as needed. for sleep 30 tablet 3  . magic mouthwash w/lidocaine  SOLN Take 5 mLs by mouth 4 (four) times daily as needed for mouth pain. Swish and Spit (Patient not taking: Reported on 12/31/2023) 240 mL 0  . oxyCODONE  (ROXICODONE ) 5 MG immediate release tablet Take 1 tablet (5 mg total) by mouth every 6 (six) hours as needed. (Patient not taking: Reported on 12/31/2023) 9 tablet 0   No facility-administered medications prior to visit.    Allergies  Allergen Reactions  . Hydrocodone  Other (See Comments)    Constipation and hallucinations  . Norvasc  [Amlodipine  Besylate] Swelling and Other (See Comments)    Marked swelling of the limbs  . Tizanidine  Other (See Comments)    After the 3rd dose, the patient's mouth began to feel numb and she felt like she was a having a "hot flash"    Review of Systems  Constitutional:  Positive for malaise/fatigue. Negative for fever.  HENT:  Negative for congestion.   Eyes:  Negative for blurred vision.  Respiratory:  Negative for shortness of breath.   Cardiovascular:  Negative for chest pain, palpitations and leg swelling.  Gastrointestinal:  Negative for abdominal pain, blood in stool and nausea.  Genitourinary:  Negative for dysuria and frequency.  Musculoskeletal:  Positive for falls and joint pain.  Skin:  Negative for rash.  Neurological:  Positive for weakness. Negative for dizziness, loss of consciousness and headaches.  Endo/Heme/Allergies:  Negative for environmental allergies.  Psychiatric/Behavioral:  Negative for depression. The patient is not nervous/anxious.        Objective:     Physical Exam Vitals and nursing note reviewed.  Constitutional:      Appearance: She is morbidly obese.  Neurological:     Mental Status: She is lethargic.    There were no vitals taken for this visit. Wt Readings from Last 3 Encounters:   12/31/23 260 lb (117.9 kg)  12/29/23 270 lb (122.5 kg)  11/07/23 268 lb 6.4 oz (121.7 kg)       Assessment & Plan:  Fall, initial encounter -     NONFORMULARY OR COMPOUNDED ITEM; Beside commode #1  frequent falls, weakness  Dispense: 1 each; Refill: 0  Neuropathy -     NONFORMULARY OR COMPOUNDED ITEM; Beside commode #1  frequent falls, weakness  Dispense: 1 each; Refill: 0  Chronic respiratory failure with hypoxia (HCC) -     NONFORMULARY OR COMPOUNDED ITEM; Beside commode #1  frequent falls, weakness  Dispense: 1 each; Refill: 0  Lower extremity edema -     Ambulatory referral to Home Health   Assessment and Plan Assessment & Plan Frequent falls   She experiences multiple falls with associated lethargy and weakness, posing a high risk for further falls due to her physical condition and home  environment. She is not in a skilled nursing facility due to dissatisfaction with previous rehab experiences, citing safety and care quality concerns. Refer her to [facility] for a skilled nursing evaluation and potential in-home support. Encourage her family to visit potential skilled nursing facilities to find a suitable option. Discuss with the family the importance of constant supervision to prevent falls.  Lethargy   Her lethargy contributes to fall risk and decreases her ability to perform daily activities. It may be related to her overall health status and recent hospitalization.  Hip pain   She has persistent soreness and aching following a fall, affecting mobility and contributing to fall risk.  Edema   Swelling in her ankles and feet is likely due to fluid retention post-hospitalization. She was previously on three torasemide tablets in the hospital, reduced to two post-discharge, contributing to discomfort and mobility issues. Increase torasemide dosage to three tablets per day and order blood work to monitor kidney function.  General Health Maintenance   She requires additional support  for mobility and daily activities at home. Provide an order for a bedside commode to assist with mobility and safety at home.    I discussed the assessment and treatment plan with the patient. The patient was provided an opportunity to ask questions and all were answered. The patient agreed with the plan and demonstrated an understanding of the instructions.   The patient was advised to call back or seek an in-person evaluation if the symptoms worsen or if the condition fails to improve as anticipated.  Estill Hemming, DO [facility] [phone] (phone) [phone] (fax)  [facility]

## 2024-01-22 NOTE — Telephone Encounter (Signed)
 Copied from CRM [id number]. Topic: Clinical - Home Health Verbal Orders >> Jan 22, 2024  8:33 AM Varney Gentleman wrote: Caller/Agency: [facility] Number: [phone] Confidential Voicemail Service Requested: Skilled Nursing Frequency: 2 week 1, 1 week 3 Any new concerns about the patient? Yes, during assessment on 5/11 patient kept falling asleep, mild confusion, was answering wrong and daughter has to correct and really weak, might be related to the diagnosis she received in hospital.

## 2024-01-23 DIAGNOSIS — N1832 Chronic kidney disease, stage 3b: Secondary | ICD-10-CM | POA: Diagnosis not present

## 2024-01-23 DIAGNOSIS — T796XXD Traumatic ischemia of muscle, subsequent encounter: Secondary | ICD-10-CM | POA: Diagnosis not present

## 2024-01-23 DIAGNOSIS — E1122 Type 2 diabetes mellitus with diabetic chronic kidney disease: Secondary | ICD-10-CM | POA: Diagnosis not present

## 2024-01-23 DIAGNOSIS — I130 Hypertensive heart and chronic kidney disease with heart failure and stage 1 through stage 4 chronic kidney disease, or unspecified chronic kidney disease: Secondary | ICD-10-CM | POA: Diagnosis not present

## 2024-01-23 DIAGNOSIS — G9341 Metabolic encephalopathy: Secondary | ICD-10-CM | POA: Diagnosis not present

## 2024-01-23 DIAGNOSIS — I5032 Chronic diastolic (congestive) heart failure: Secondary | ICD-10-CM | POA: Diagnosis not present

## 2024-01-23 DIAGNOSIS — N179 Acute kidney failure, unspecified: Secondary | ICD-10-CM | POA: Diagnosis not present

## 2024-01-23 DIAGNOSIS — J439 Emphysema, unspecified: Secondary | ICD-10-CM | POA: Diagnosis not present

## 2024-01-23 DIAGNOSIS — I214 Non-ST elevation (NSTEMI) myocardial infarction: Secondary | ICD-10-CM | POA: Diagnosis not present

## 2024-01-23 NOTE — Telephone Encounter (Signed)
 Pt had vv yesterday

## 2024-01-24 ENCOUNTER — Telehealth: Payer: Self-pay

## 2024-01-24 NOTE — Telephone Encounter (Signed)
 Copied from CRM [id number]. Topic: Clinical - Home Health Verbal Orders >> Jan 24, 2024  9:50 AM Freya Jesus wrote: Caller/Agency: [facility] Callback Number: [phone] (Confidential Voicemail) Service Requested: Skilled Nursing, Social Worker Frequency: 2 week 1, 1 week 3 --Evaluate and treat (related to APS referral)  Any new concerns about the patient? No, same as Monday - during assessment on 5/11 - patient is in really bad shape.

## 2024-01-26 ENCOUNTER — Telehealth: Payer: Self-pay

## 2024-01-26 DIAGNOSIS — G9341 Metabolic encephalopathy: Secondary | ICD-10-CM | POA: Diagnosis not present

## 2024-01-26 DIAGNOSIS — E1122 Type 2 diabetes mellitus with diabetic chronic kidney disease: Secondary | ICD-10-CM | POA: Diagnosis not present

## 2024-01-26 DIAGNOSIS — I5032 Chronic diastolic (congestive) heart failure: Secondary | ICD-10-CM | POA: Diagnosis not present

## 2024-01-26 DIAGNOSIS — J439 Emphysema, unspecified: Secondary | ICD-10-CM | POA: Diagnosis not present

## 2024-01-26 DIAGNOSIS — I214 Non-ST elevation (NSTEMI) myocardial infarction: Secondary | ICD-10-CM | POA: Diagnosis not present

## 2024-01-26 DIAGNOSIS — I130 Hypertensive heart and chronic kidney disease with heart failure and stage 1 through stage 4 chronic kidney disease, or unspecified chronic kidney disease: Secondary | ICD-10-CM | POA: Diagnosis not present

## 2024-01-26 DIAGNOSIS — T796XXD Traumatic ischemia of muscle, subsequent encounter: Secondary | ICD-10-CM | POA: Diagnosis not present

## 2024-01-26 DIAGNOSIS — N179 Acute kidney failure, unspecified: Secondary | ICD-10-CM | POA: Diagnosis not present

## 2024-01-26 DIAGNOSIS — N1832 Chronic kidney disease, stage 3b: Secondary | ICD-10-CM | POA: Diagnosis not present

## 2024-01-26 NOTE — Telephone Encounter (Signed)
 Copied from CRM [id number]. Topic: Clinical - Home Health Verbal Orders >> Jan 26, 2024  1:07 PM Allyne Areola wrote: Caller/Agency: [facility] Callback Number: [phone] Service Requested: Occupational Therapy Frequency: 1 time a week for 3 weeks and then twice a week for 1 week Any new concerns about the patient? No

## 2024-01-26 NOTE — Telephone Encounter (Signed)
 Verbal given

## 2024-01-29 ENCOUNTER — Telehealth: Payer: Self-pay | Admitting: Neurology

## 2024-01-29 DIAGNOSIS — E1122 Type 2 diabetes mellitus with diabetic chronic kidney disease: Secondary | ICD-10-CM | POA: Diagnosis not present

## 2024-01-29 DIAGNOSIS — N179 Acute kidney failure, unspecified: Secondary | ICD-10-CM | POA: Diagnosis not present

## 2024-01-29 DIAGNOSIS — I130 Hypertensive heart and chronic kidney disease with heart failure and stage 1 through stage 4 chronic kidney disease, or unspecified chronic kidney disease: Secondary | ICD-10-CM | POA: Diagnosis not present

## 2024-01-29 DIAGNOSIS — T796XXD Traumatic ischemia of muscle, subsequent encounter: Secondary | ICD-10-CM | POA: Diagnosis not present

## 2024-01-29 DIAGNOSIS — J439 Emphysema, unspecified: Secondary | ICD-10-CM | POA: Diagnosis not present

## 2024-01-29 DIAGNOSIS — G9341 Metabolic encephalopathy: Secondary | ICD-10-CM | POA: Diagnosis not present

## 2024-01-29 DIAGNOSIS — I214 Non-ST elevation (NSTEMI) myocardial infarction: Secondary | ICD-10-CM | POA: Diagnosis not present

## 2024-01-29 DIAGNOSIS — N1832 Chronic kidney disease, stage 3b: Secondary | ICD-10-CM | POA: Diagnosis not present

## 2024-01-29 DIAGNOSIS — I5032 Chronic diastolic (congestive) heart failure: Secondary | ICD-10-CM | POA: Diagnosis not present

## 2024-01-29 NOTE — Telephone Encounter (Signed)
 Copied from CRM [id number]. Topic: Clinical - Medical Advice >> Jan 29, 2024 11:34 AM Dimple Francis wrote: Reason for CRM: [facility]- # [phone]   Patients weight is  257 as of 5/19 11:25am >> Jan 29, 2024 11:37 AM Albertha Alosa wrote: 249 on 05/11

## 2024-01-30 ENCOUNTER — Telehealth: Payer: Self-pay

## 2024-01-30 ENCOUNTER — Telehealth: Payer: Self-pay | Admitting: Cardiology

## 2024-01-30 DIAGNOSIS — G9341 Metabolic encephalopathy: Secondary | ICD-10-CM | POA: Diagnosis not present

## 2024-01-30 DIAGNOSIS — T796XXD Traumatic ischemia of muscle, subsequent encounter: Secondary | ICD-10-CM | POA: Diagnosis not present

## 2024-01-30 DIAGNOSIS — I130 Hypertensive heart and chronic kidney disease with heart failure and stage 1 through stage 4 chronic kidney disease, or unspecified chronic kidney disease: Secondary | ICD-10-CM | POA: Diagnosis not present

## 2024-01-30 DIAGNOSIS — E1122 Type 2 diabetes mellitus with diabetic chronic kidney disease: Secondary | ICD-10-CM | POA: Diagnosis not present

## 2024-01-30 DIAGNOSIS — I214 Non-ST elevation (NSTEMI) myocardial infarction: Secondary | ICD-10-CM | POA: Diagnosis not present

## 2024-01-30 DIAGNOSIS — N1832 Chronic kidney disease, stage 3b: Secondary | ICD-10-CM | POA: Diagnosis not present

## 2024-01-30 DIAGNOSIS — J439 Emphysema, unspecified: Secondary | ICD-10-CM | POA: Diagnosis not present

## 2024-01-30 DIAGNOSIS — N179 Acute kidney failure, unspecified: Secondary | ICD-10-CM | POA: Diagnosis not present

## 2024-01-30 DIAGNOSIS — I5032 Chronic diastolic (congestive) heart failure: Secondary | ICD-10-CM | POA: Diagnosis not present

## 2024-01-30 NOTE — Telephone Encounter (Signed)
 Copied from CRM [id number]. Topic: Clinical - Medical Advice >> Jan 30, 2024  3:37 PM Turkey A wrote: Patient weight is 259.6 today;  Denise with [facility] would like for the nurse to call her [phone]

## 2024-01-30 NOTE — Telephone Encounter (Signed)
 Spoke with [facility]. Tyra Galley states pt is not talking to daughter currently who normally handles appointments. Jannifer Mention of appointment and time for hospital follow up and the last visit being in 2022. Tyra Galley states she is going to contact PCP about weight gain and medications as they are currently following her medications.  -Tyra Galley stated to not call daughter and that the pt sleeps during the day and is up at night for future reference.

## 2024-01-30 NOTE — Telephone Encounter (Signed)
 Pt c/o swelling: STAT is pt has developed SOB within 24 hours  How much weight have you gained and in what time span?  2.6 lbs in 1 day  If swelling, where is the swelling located?  Legs, hard to distinguish swelling anywhere else due to patient's weight. Per denise, +3 on feet, +2 on legs. Tyra Galley says patient's eating habits are contributing. She eats [employer], [employer], and has a frozen pepperoni pizza in her freezer. Patient and daughter are not speaking and no longer getting along right now. Please do not call daughter  Are you currently taking a fluid pill?  Torsemide  20 MG 3x daily, but patient takes medication around 10:00-11:00 PM, per nurse  Are you currently SOB?   Do you have a log of your daily weights (if so, list)?  Yesterday 257 lbs Today 259.6 lbs  Have you gained 3 pounds in a day or 5 pounds in a week?   Have you traveled recently?

## 2024-02-02 NOTE — Progress Notes (Unsigned)
 {This patient may be at risk for Amyloid. She has one or more dx on the prob list or PMH from the following list -  Abnormal EKG, HFpEF/Diastolic CHF, Aortic Stenosis, LVH, Bilateral Carpal Tunnel Syndrome, Biceps Tendon Rupture, Spinal Stenosis, Pericardial Effusion, Left Atrial Enlargement, Conduction System Disorder. See list below or review PMH.  Diagnoses From Problem List           Noted     Chronic diastolic heart failure (HCC) 01/08/2020     Chronic heart failure with preserved ejection fraction (HFpEF) (HCC) 01/06/2024     QT prolongation 08/03/2019    Click HERE to open Cardiac Amyloid Screening SmartSet to order screening OR Click HERE to defer testing for 1 year or permanently :1}    Cardiology Office Note:    Date:  02/02/2024  ID:  Sue [name], DOB 04/04/1951, MRN [id number] PCP: Sue [name], Sue Chalk, DO  [facility] Providers Cardiologist:  Sue Morin, DO { Click to update primary MD,subspecialty MD or APP then REFRESH:1}    Patient Profile:     (HFpEF) heart failure with preserved ejection fraction  Hx of eval for dyspnea - felt to be multifactorial (COPD, CHF, obesity) TTE 01/04/2024: EF 55-60, mild asymmetric LVH, GR 1 DD, mildly reduced RVSF, trivial MR, mid inferoseptal and mid inferior HK NSTEMI Elev hsT in setting of rhabdomyolysis 12/2023 - no cardiac catheterization due to confusion, AKI >> consider OP stress test  Palpitations  Hypertension  Diabetes mellitus  Chronic kidney disease  Hx of TIA in 2016 Emphysema w chronic hypoxic resp failure on O2 Hx of DVT  Hypothyroidism  GERD Fhx of CAD (F s/p CABG, multiple paternal uncles ? from MI)       Discussed the use of AI scribe software for clinical note transcription with the patient, who gave verbal consent to proceed. History of Present Illness Sue [name] is a 73 y.o. female who returns for posthospitalization follow-up.  She was last seen by Dr. Emmette Harms 05/2021. She was admitted 12/2023 for  rhabdomyolysis, AKI (SCr peak 2.06), metabolic encephalopathy after a fall (down for 24 hours) and was seen by Cardiology for NSTEMI. She developed epigastric/chest pain after admission and follow up hsT was elevated. Initial hsT was normal. Repeat was mildly elevated (236-235), but flat. TTE showed normal EF with inf and inf-sept HK. Pt was not a candidate for cardiac catheterization at that time due to ongoing confusion, AKI. She was continued on IV heparin  for 48 hours. It was suggested she follow up as an OP and consider PET MPI to assess for ischemia after recovery from her illness. GDMT: ASA 81, Metoprolol  succinate 100. No statin was started due to presentation with rhabdomyolysis. She noted a hx of palpitations. No arrhythmias were noted on tele. OP monitor can be considered. Of note, creatinine improved (1.35 on 01/08/24). Torsemide  was resumed. She returned to the ED 1 day after DC for placement. She was DC to SNF.     ROS-See HPI***    Studies Reviewed:      Results  Risk Assessment/Calculations: {Does this patient have ATRIAL FIBRILLATION?:[phone]} No BP recorded.  {Refresh Note OR Click here to enter BP  :1}***      Physical Exam:  VS:  There were no vitals taken for this visit.   Wt Readings from Last 3 Encounters:  12/31/23 260 lb (117.9 kg)  12/29/23 270 lb (122.5 kg)  11/07/23 268 lb 6.4 oz (121.7 kg)    Physical  Exam***     Assessment and Plan: Assessment & Plan History of non-ST elevation myocardial infarction (NSTEMI)  Chronic heart failure with preserved ejection fraction (HCC)  Palpitations  Assessment and Plan Assessment & Plan    {      :1}    {Are you ordering a CV Procedure (e.g. stress test, cath, DCCV, TEE, etc)?   Press F2        :[id number]}  Dispo:  No follow-ups on file. Signed, Marlyse Single, PA-C

## 2024-02-06 ENCOUNTER — Ambulatory Visit: Attending: Physician Assistant | Admitting: Cardiology

## 2024-02-06 VITALS — BP 122/84 | HR 82 | Ht 63.0 in | Wt 258.2 lb

## 2024-02-06 DIAGNOSIS — N179 Acute kidney failure, unspecified: Secondary | ICD-10-CM | POA: Diagnosis not present

## 2024-02-06 DIAGNOSIS — T796XXD Traumatic ischemia of muscle, subsequent encounter: Secondary | ICD-10-CM | POA: Diagnosis not present

## 2024-02-06 DIAGNOSIS — R079 Chest pain, unspecified: Secondary | ICD-10-CM | POA: Diagnosis not present

## 2024-02-06 DIAGNOSIS — I5032 Chronic diastolic (congestive) heart failure: Secondary | ICD-10-CM | POA: Diagnosis not present

## 2024-02-06 DIAGNOSIS — I10 Essential (primary) hypertension: Secondary | ICD-10-CM

## 2024-02-06 DIAGNOSIS — I214 Non-ST elevation (NSTEMI) myocardial infarction: Secondary | ICD-10-CM | POA: Diagnosis not present

## 2024-02-06 DIAGNOSIS — J439 Emphysema, unspecified: Secondary | ICD-10-CM | POA: Diagnosis not present

## 2024-02-06 DIAGNOSIS — R002 Palpitations: Secondary | ICD-10-CM | POA: Diagnosis not present

## 2024-02-06 DIAGNOSIS — E1122 Type 2 diabetes mellitus with diabetic chronic kidney disease: Secondary | ICD-10-CM | POA: Diagnosis not present

## 2024-02-06 DIAGNOSIS — N1832 Chronic kidney disease, stage 3b: Secondary | ICD-10-CM | POA: Diagnosis not present

## 2024-02-06 DIAGNOSIS — I252 Old myocardial infarction: Secondary | ICD-10-CM | POA: Diagnosis not present

## 2024-02-06 DIAGNOSIS — G9341 Metabolic encephalopathy: Secondary | ICD-10-CM | POA: Diagnosis not present

## 2024-02-06 DIAGNOSIS — I130 Hypertensive heart and chronic kidney disease with heart failure and stage 1 through stage 4 chronic kidney disease, or unspecified chronic kidney disease: Secondary | ICD-10-CM | POA: Diagnosis not present

## 2024-02-06 LAB — BASIC METABOLIC PANEL WITH GFR
BUN/Creatinine Ratio: 7 — ABNORMAL LOW (ref 12–28)
BUN: 12 mg/dL (ref 8–27)
CO2: 28 mmol/L (ref 20–29)
Calcium: 8.6 mg/dL — ABNORMAL LOW (ref 8.7–10.3)
Chloride: 97 mmol/L (ref 96–106)
Creatinine, Ser: 1.7 mg/dL — ABNORMAL HIGH (ref 0.57–1.00)
Glucose: 116 mg/dL — ABNORMAL HIGH (ref 70–99)
Potassium: 3.3 mmol/L — ABNORMAL LOW (ref 3.5–5.2)
Sodium: 143 mmol/L (ref 134–144)
eGFR: 32 mL/min/{1.73_m2} — ABNORMAL LOW (ref >59)

## 2024-02-06 LAB — CBC
Hematocrit: 39 % (ref 34.0–46.6)
Hemoglobin: 12.1 g/dL (ref 11.1–15.9)
MCH: 30.3 pg (ref 26.6–33.0)
MCHC: 31 g/dL — ABNORMAL LOW (ref 31.5–35.7)
MCV: 98 fL — ABNORMAL HIGH (ref 79–97)
Platelets: 176 10*3/uL (ref 150–450)
RBC: 4 x10E6/uL (ref 3.77–5.28)
RDW: 14.5 % (ref 11.7–15.4)
WBC: 5.4 10*3/uL (ref 3.4–10.8)

## 2024-02-06 MED ORDER — METOPROLOL SUCCINATE ER 100 MG PO TB24: 100.0000 mg | ORAL_TABLET | Freq: Every day | ORAL | 3 refills | Status: AC

## 2024-02-06 NOTE — Progress Notes (Signed)
 Cardiology Office Note:  .   Date:  02/06/2024  ID:  Sue [name], DOB June 07, 1951, MRN [id number] PCP: Crecencio Dodge, Candida Chalk, DO  [facility] Providers Cardiologist:  Jerryl Morin, DO {  History of Present Illness: .   Sue [name] is a 73 y.o. female with history of chronic HFpEF, hypertension, falls, family history of CAD (father CABG), uncontrolled diabetes followed by endocrinology, CKD, TIA 2016, emphysema with chronic respiratory failure on supplemental oxygen , DVT, hypothyroidism, obesity.  She was last seen by Dr. Emmette Harms 05/2021 with complaints of DOE and palpitations.  Coronary CT and heart monitor recommended, never completed.  Most recently admitted 12/2023 for rhabdomyolysis, metabolic encephalopathy after a fall (was stuck and down for >24 hours), elevated troponins.  She had developed epigastric/chest pain after admission, initial troponin negative. Repeat was mildly elevated (236-235), but flat. TTE showed normal EF with inf and inf-sept HK. Pt was not a candidate for cardiac catheterization at that time due to ongoing confusion, AKI.  PET stress was recommended. No statin with rhabdomyolysis.  Recently contacted PCP and cardiology office with complaints of weight gain and peripheral edema.   Today patient presents for hospital follow-up.  Reports overall doing well, has had 2 more instances of chest tightness that only occurs at rest, some radiation to her neck.  Only lasts for about 5 to 10 minutes.  She ambulates with a walker, reports no exertional symptoms though.  She reports some weight loss, 258 today, feels comfortable on her diuretic dose with no significant edema or shortness of breath.  Chronically is on supplemental oxygen .  Reports palpitations that occur randomly though not significant.   ROS: Denies:  shortness of breath, orthopnea, peripheral edema, decreased exercise intolerance, fatigue, lightheadedness.   Studies Reviewed: .       Echocardiogram  01/04/2024 1. Left ventricular ejection fraction, by estimation, is 55 to 60%. The  left ventricle has normal function. The left ventricle demonstrates  regional wall motion abnormalities (see scoring diagram/findings for  description). There is mild asymmetric left  ventricular hypertrophy of the basal-septal segment. Left ventricular  diastolic parameters are consistent with Grade I diastolic dysfunction  (impaired relaxation).   2. Right ventricular systolic function is mildly reduced. The right  ventricular size is mildly enlarged.   3. The mitral valve is normal in structure. Trivial mitral valve  regurgitation. No evidence of mitral stenosis.   4. The aortic valve is tricuspid. There is mild calcification of the  aortic valve. Aortic valve regurgitation is not visualized. No aortic  stenosis is present.   5. The inferior vena cava is normal in size with greater than 50%  respiratory variability, suggesting right atrial pressure of 3 mmHg.     Risk Assessment/Calculations:             Physical Exam:   VS:  BP 122/84   Pulse 82   Ht 5\' 3"  (1.6 m)   Wt 258 lb 3.2 oz (117.1 kg)   SpO2 98%   BMI 45.74 kg/m    Wt Readings from Last 3 Encounters:  02/06/24 258 lb 3.2 oz (117.1 kg)  12/31/23 260 lb (117.9 kg)  12/29/23 270 lb (122.5 kg)    GEN: Well nourished, well developed in no acute distress NECK: No JVD; No carotid bruits CARDIAC: RRR, no murmurs, rubs, gallops RESPIRATORY:  Clear to auscultation without rales, wheezing or rhonchi  ABDOMEN: Soft, non-tender, non-distended EXTREMITIES:  mild edema; No deformity   ASSESSMENT AND  PLAN: .    Chest pain Recently hospitalized and had elevated troponins that were flat in the 200s in the setting of rhabdo with for complaints of epigastric/chest pain.  EKG with no acute ST-T wave changes.  Echo with preserved EF but with mid inferoseptal/mid inferior hypokinesis.  Reports 2 more episodes that are nonexertional but with  radiating pain.  Symptoms overall sound atypical however does have significant family history and other risk factors. Plan for PET/CT stress test, obtain BMP/CBC (renal function has been improving) Continue with aspirin , Toprol -XL 100 mg, rosuvastatin  10 mg.  LDL is well-controlled, 33 in February 2025.  Chronic HFpEF Multifactorial due to obesity, COPD.  Overall with stable symptoms does not report any significant peripheral edema/orthopnea. Continue with torsemide  60 mg daily, potassium supplementation.  Hypertension Well-controlled, 122/84 today.  Palpitations No arrhythmias noted on telemetry inpatient.  Discussed heart monitor, already on beta-blocker therapy, not having significant symptoms so we will defer for the time being as this is likely not going to change management. Continue with Toprol -XL 100 mg.  Uncontrolled diabetes Followed by endocrinology, A1c 10.3% 3 months ago. Will defer to endocrinologist if candidate for SGLT2 inhibitor/GLP-1.  CKD  Recent admission for rhabdo 12/2023, renal function appears to be improving.  BMP as above.  Emphysema Chronic respiratory failure on supplemental oxygen   BMI 45  Highly encourage lifestyle modifications, increasing step count and avoiding sodas and fast food.  Weight today is 258.     Informed Consent   Shared Decision Making/Informed Consent{  The risks [chest pain, shortness of breath, cardiac arrhythmias, dizziness, blood pressure fluctuations, myocardial infarction, stroke/transient ischemic attack, nausea, vomiting, allergic reaction, radiation exposure, metallic taste sensation and life-threatening complications (estimated to be 1 in 10,000)], benefits (risk stratification, diagnosing coronary artery disease, treatment guidance) and alternatives of a cardiac PET stress test were discussed in detail with Ms. Desmith and she agrees to proceed.     Dispo: 3 months, follow-up on PET stress.  Signed, Burnetta Cart, PA-C

## 2024-02-06 NOTE — Patient Instructions (Addendum)
 Medication Instructions:  Your physician recommends that you continue on your current medications as directed. Please refer to the Current Medication list given to you today.  *If you need a refill on your cardiac medications before your next appointment, please call your pharmacy*  Lab Work: TODAY:  BMET & CBC  If you have labs (blood work) drawn today and your tests are completely normal, you will receive your results only by: MyChart Message (if you have MyChart) OR A paper copy in the mail If you have any lab test that is abnormal or we need to change your treatment, we will call you to review the results.  Testing/Procedures:    Please report to Radiology at the [facility] Main Entrance 30 minutes early for your test.  [address] [place], [place] [address]                         OR   Please report to Radiology at [facility] Main Entrance, medical mall, 30 mins prior to your test.  [address]  [place], [place]  How to Prepare for Your Cardiac PET/CT Stress Test:  Nothing to eat or drink, except water, 3 hours prior to arrival time.  NO caffeine/decaffeinated products, or chocolate 12 hours prior to arrival. (Please note decaffeinated beverages (teas/coffees) still contain caffeine).  If you have caffeine within 12 hours prior, the test will need to be rescheduled.  Medication instructions: Do not take nitrates (isosorbide mononitrate, Ranexa) the day before or day of test Do not take tamsulosin the day before or morning of test Hold theophylline containing medications for 12 hours. Hold Dipyridamole 48 hours prior to the test.  Diabetic Preparation: If able to eat breakfast prior to 3 hour fasting, you may take all medications, including your insulin . Do not worry if you miss your breakfast dose of insulin  - start at your next meal. If you do not eat prior to 3 hour fast-Hold all diabetes (oral and insulin )  medications. Patients who wear a continuous glucose monitor MUST remove the device prior to scanning.  You may take your remaining medications with water.  NO perfume, cologne or lotion on chest or abdomen area. FEMALES - Please avoid wearing dresses to this appointment.  Total time is 1 to 2 hours; you may want to bring reading material for the waiting time.  IF YOU THINK YOU MAY BE PREGNANT, OR ARE NURSING PLEASE INFORM THE TECHNOLOGIST.  In preparation for your appointment, medication and supplies will be purchased.  Appointment availability is limited, so if you need to cancel or reschedule, please call the Radiology Department Scheduler at [phone] 24 hours in advance to avoid a cancellation fee of $100.00  What to Expect When you Arrive:  Once you arrive and check in for your appointment, you will be taken to a preparation room within the Radiology Department.  A technologist or Nurse will obtain your medical history, verify that you are correctly prepped for the exam, and explain the procedure.  Afterwards, an IV will be started in your arm and electrodes will be placed on your skin for EKG monitoring during the stress portion of the exam. Then you will be escorted to the PET/CT scanner.  There, staff will get you positioned on the scanner and obtain a blood pressure and EKG.  During the exam, you will continue to be connected to the EKG and blood pressure machines.  A small,  safe amount of a radioactive tracer will be injected in your IV to obtain a series of pictures of your heart along with an injection of a stress agent.    After your Exam:  It is recommended that you eat a meal and drink a caffeinated beverage to counter act any effects of the stress agent.  Drink plenty of fluids for the remainder of the day and urinate frequently for the first couple of hours after the exam.  Your doctor will inform you of your test results within 7-10 business days.  For more information and  frequently asked questions, please visit our website: [email or web address]  For questions about your test or how to prepare for your test, please call: Cardiac Imaging Nurse Navigators Office: [phone]   Follow-Up: At [name], you and your health needs are our priority.  As part of our continuing mission to provide you with exceptional heart care, our providers are all part of one team.  This team includes your primary Cardiologist (physician) and Advanced Practice Providers or APPs (Physician Assistants and Nurse Practitioners) who all work together to provide you with the care you need, when you need it.  Your next appointment:   6 week(s)  Provider:   Kardie Tobb, DO or Marlyse Single, PA-C          We recommend signing up for the patient portal called "MyChart".  Sign up information is provided on this After Visit Summary.  MyChart is used to connect with patients for Virtual Visits (Telemedicine).  Patients are able to view lab/test results, encounter notes, upcoming appointments, etc.  Non-urgent messages can be sent to your provider as well.   To learn more about what you can do with MyChart, go to [email or web address].   Other Instructions

## 2024-02-08 DIAGNOSIS — N179 Acute kidney failure, unspecified: Secondary | ICD-10-CM | POA: Diagnosis not present

## 2024-02-08 DIAGNOSIS — G9341 Metabolic encephalopathy: Secondary | ICD-10-CM | POA: Diagnosis not present

## 2024-02-08 DIAGNOSIS — I130 Hypertensive heart and chronic kidney disease with heart failure and stage 1 through stage 4 chronic kidney disease, or unspecified chronic kidney disease: Secondary | ICD-10-CM | POA: Diagnosis not present

## 2024-02-08 DIAGNOSIS — T796XXD Traumatic ischemia of muscle, subsequent encounter: Secondary | ICD-10-CM | POA: Diagnosis not present

## 2024-02-08 DIAGNOSIS — J439 Emphysema, unspecified: Secondary | ICD-10-CM | POA: Diagnosis not present

## 2024-02-08 DIAGNOSIS — I214 Non-ST elevation (NSTEMI) myocardial infarction: Secondary | ICD-10-CM | POA: Diagnosis not present

## 2024-02-08 DIAGNOSIS — I5032 Chronic diastolic (congestive) heart failure: Secondary | ICD-10-CM | POA: Diagnosis not present

## 2024-02-08 DIAGNOSIS — E1122 Type 2 diabetes mellitus with diabetic chronic kidney disease: Secondary | ICD-10-CM | POA: Diagnosis not present

## 2024-02-08 DIAGNOSIS — N1832 Chronic kidney disease, stage 3b: Secondary | ICD-10-CM | POA: Diagnosis not present

## 2024-02-12 ENCOUNTER — Other Ambulatory Visit: Payer: Self-pay | Admitting: Family Medicine

## 2024-02-12 ENCOUNTER — Telehealth: Payer: Self-pay | Admitting: Family Medicine

## 2024-02-12 NOTE — Telephone Encounter (Signed)
 Copied from CRM [id number]. Topic: Clinical - Medication Refill >> Feb 12, 2024  4:21 PM Odilia Bennett D wrote: Medication: QUEtiapine  (SEROQUEL ) 200 MG tablet Patient just got out of the hospital and they told her to contact her primary for a refill   Has the patient contacted their pharmacy? Yes (Agent: If no, request that the patient contact the pharmacy for the refill. If patient does not wish to contact the pharmacy document the reason why and proceed with request.) (Agent: If yes, when and what did the pharmacy advise?)  This is the patient's preferred pharmacy:  [facility] [address] [place] - [address] [place] [address] Phone: [phone] Fax: [phone]  Is this the correct pharmacy for this prescription? Yes If no, delete pharmacy and type the correct one.   Has the prescription been filled recently? No  Is the patient out of the medication? Yes  Has the patient been seen for an appointment in the last year OR does the patient have an upcoming appointment? Yes  Can we respond through MyChart? Yes  Agent: Please be advised that Rx refills may take up to 3 business days. We ask that you follow-up with your pharmacy.

## 2024-02-12 NOTE — Telephone Encounter (Signed)
 Copied from CRM [id number]. Topic: Medicare AWV >> Feb 12, 2024  2:38 PM Juliana Ocean wrote: Reason for CRM: LVM 02/13/2024 to schedule AWV. Please schedule Virtual or Telehealth visits ONLY.   Rosalee Collins; Care Guide Ambulatory Clinical Support [facility] Direct Dial: [phone]

## 2024-02-13 ENCOUNTER — Ambulatory Visit: Payer: Self-pay | Admitting: Cardiology

## 2024-02-13 DIAGNOSIS — E876 Hypokalemia: Secondary | ICD-10-CM

## 2024-02-13 DIAGNOSIS — N1832 Chronic kidney disease, stage 3b: Secondary | ICD-10-CM

## 2024-02-13 DIAGNOSIS — Z79899 Other long term (current) drug therapy: Secondary | ICD-10-CM

## 2024-02-13 MED ORDER — QUETIAPINE FUMARATE 200 MG PO TABS: 200.0000 mg | ORAL_TABLET | Freq: Every day | ORAL | 1 refills | Status: DC

## 2024-02-13 MED ORDER — POTASSIUM CHLORIDE CRYS ER 20 MEQ PO TBCR
40.0000 meq | EXTENDED_RELEASE_TABLET | Freq: Every day | ORAL | 0 refills | Status: DC
Start: 2024-02-13 — End: 2024-03-04

## 2024-02-13 NOTE — Telephone Encounter (Signed)
 The patient has been notified of the result and verbalized understanding.  All questions (if any) were answered.  Pt aware to increase her KDUR to 40 mEq po daily and come in for repeat BMET to reassess her K level in one week.  Confirmed the pharmacy of choice with the pt.  Pt states she has a Cardiac PET Scan with our office next Wed 6/11, and will stop by our lab at this time.  Future BMET in one week placed in the system and released.   Pt verbalized understanding and agrees with this plan.

## 2024-02-13 NOTE — Telephone Encounter (Signed)
-----   Message from Burnetta Cart sent at 02/13/2024  7:35 AM EDT ----- Labs look within normal baseline. K has been persistently low, she can increase to 40 meq daily from 20. Lets check labs again in a week to make sure we find steady state of K. Reluctant to use spiro with current renal function though.

## 2024-02-14 ENCOUNTER — Telehealth: Payer: Self-pay

## 2024-02-14 DIAGNOSIS — N179 Acute kidney failure, unspecified: Secondary | ICD-10-CM | POA: Diagnosis not present

## 2024-02-14 DIAGNOSIS — J439 Emphysema, unspecified: Secondary | ICD-10-CM | POA: Diagnosis not present

## 2024-02-14 DIAGNOSIS — E1122 Type 2 diabetes mellitus with diabetic chronic kidney disease: Secondary | ICD-10-CM | POA: Diagnosis not present

## 2024-02-14 DIAGNOSIS — G9341 Metabolic encephalopathy: Secondary | ICD-10-CM | POA: Diagnosis not present

## 2024-02-14 DIAGNOSIS — I214 Non-ST elevation (NSTEMI) myocardial infarction: Secondary | ICD-10-CM | POA: Diagnosis not present

## 2024-02-14 DIAGNOSIS — I130 Hypertensive heart and chronic kidney disease with heart failure and stage 1 through stage 4 chronic kidney disease, or unspecified chronic kidney disease: Secondary | ICD-10-CM | POA: Diagnosis not present

## 2024-02-14 DIAGNOSIS — N1832 Chronic kidney disease, stage 3b: Secondary | ICD-10-CM | POA: Diagnosis not present

## 2024-02-14 DIAGNOSIS — I5032 Chronic diastolic (congestive) heart failure: Secondary | ICD-10-CM | POA: Diagnosis not present

## 2024-02-14 DIAGNOSIS — T796XXD Traumatic ischemia of muscle, subsequent encounter: Secondary | ICD-10-CM | POA: Diagnosis not present

## 2024-02-14 NOTE — Telephone Encounter (Addendum)
 Patient was seen by Joeseph Muss PA 5/27 Called and spoke with patient Denies any shortness of breath or swelling  Scheduled follow up 7/22 Per patient she weighs herself every morning Advised to call back if weight goes up or any shortness of breath   Will forward to Dr Emmette Harms for review

## 2024-02-14 NOTE — Telephone Encounter (Signed)
 Copied from CRM [id number]. Topic: General - Other >> Feb 14, 2024 12:44 PM Magdalene School wrote: Reason for CRM: Rexine Cater, occupational therapist calling from [facility] to notifiy of weight gain above paramiter which is set at 252lb. current weight is 258. last few weights are:  258lb 02/14/2024 253lb 5/29/ 258lb 02/06/2024 259.6lb 01/30/2024 257lb 01/29/2024 247lb 01/21/2024 reports no shortness of breath   call back number: [phone]

## 2024-02-15 ENCOUNTER — Telehealth: Payer: Self-pay | Admitting: Family Medicine

## 2024-02-15 ENCOUNTER — Telehealth: Payer: Self-pay

## 2024-02-15 DIAGNOSIS — T796XXD Traumatic ischemia of muscle, subsequent encounter: Secondary | ICD-10-CM | POA: Diagnosis not present

## 2024-02-15 DIAGNOSIS — J439 Emphysema, unspecified: Secondary | ICD-10-CM | POA: Diagnosis not present

## 2024-02-15 DIAGNOSIS — N1832 Chronic kidney disease, stage 3b: Secondary | ICD-10-CM | POA: Diagnosis not present

## 2024-02-15 DIAGNOSIS — I130 Hypertensive heart and chronic kidney disease with heart failure and stage 1 through stage 4 chronic kidney disease, or unspecified chronic kidney disease: Secondary | ICD-10-CM | POA: Diagnosis not present

## 2024-02-15 DIAGNOSIS — I5032 Chronic diastolic (congestive) heart failure: Secondary | ICD-10-CM | POA: Diagnosis not present

## 2024-02-15 DIAGNOSIS — E1122 Type 2 diabetes mellitus with diabetic chronic kidney disease: Secondary | ICD-10-CM | POA: Diagnosis not present

## 2024-02-15 DIAGNOSIS — I214 Non-ST elevation (NSTEMI) myocardial infarction: Secondary | ICD-10-CM | POA: Diagnosis not present

## 2024-02-15 DIAGNOSIS — G9341 Metabolic encephalopathy: Secondary | ICD-10-CM | POA: Diagnosis not present

## 2024-02-15 DIAGNOSIS — N179 Acute kidney failure, unspecified: Secondary | ICD-10-CM | POA: Diagnosis not present

## 2024-02-15 NOTE — Telephone Encounter (Signed)
 LMOM for [employer] w/ verbal orders

## 2024-02-15 NOTE — Telephone Encounter (Signed)
 FYI

## 2024-02-15 NOTE — Telephone Encounter (Signed)
 Copied from CRM [id number]. Topic: Clinical - Home Health Verbal Orders >> Feb 15, 2024 10:28 AM Elita Guitar wrote: Caller/Agency: Landry Pinks [facility] Callback Number: [phone] Service Requested: Physical Therapy Frequency: 1x week for 9 weeks  Any new concerns about the patient? Yes Recently discharged from Hospital for SOB + heart failure

## 2024-02-15 NOTE — Telephone Encounter (Signed)
 Copied from CRM [id number]. Topic: General - Other >> Feb 15, 2024 10:00 AM Clyde Darling P wrote: Reason for CRM: PT from [facility] calling to report weight for pt : 257.6

## 2024-02-16 DIAGNOSIS — T796XXD Traumatic ischemia of muscle, subsequent encounter: Secondary | ICD-10-CM | POA: Diagnosis not present

## 2024-02-16 DIAGNOSIS — J439 Emphysema, unspecified: Secondary | ICD-10-CM | POA: Diagnosis not present

## 2024-02-16 DIAGNOSIS — I5032 Chronic diastolic (congestive) heart failure: Secondary | ICD-10-CM | POA: Diagnosis not present

## 2024-02-16 DIAGNOSIS — N179 Acute kidney failure, unspecified: Secondary | ICD-10-CM | POA: Diagnosis not present

## 2024-02-16 DIAGNOSIS — E1122 Type 2 diabetes mellitus with diabetic chronic kidney disease: Secondary | ICD-10-CM | POA: Diagnosis not present

## 2024-02-16 DIAGNOSIS — G9341 Metabolic encephalopathy: Secondary | ICD-10-CM | POA: Diagnosis not present

## 2024-02-16 DIAGNOSIS — I130 Hypertensive heart and chronic kidney disease with heart failure and stage 1 through stage 4 chronic kidney disease, or unspecified chronic kidney disease: Secondary | ICD-10-CM | POA: Diagnosis not present

## 2024-02-16 DIAGNOSIS — N1832 Chronic kidney disease, stage 3b: Secondary | ICD-10-CM | POA: Diagnosis not present

## 2024-02-16 DIAGNOSIS — I214 Non-ST elevation (NSTEMI) myocardial infarction: Secondary | ICD-10-CM | POA: Diagnosis not present

## 2024-02-18 ENCOUNTER — Other Ambulatory Visit: Payer: Self-pay | Admitting: Family Medicine

## 2024-02-18 DIAGNOSIS — R600 Localized edema: Secondary | ICD-10-CM

## 2024-02-19 ENCOUNTER — Encounter (HOSPITAL_COMMUNITY): Payer: Self-pay

## 2024-02-19 ENCOUNTER — Telehealth (INDEPENDENT_AMBULATORY_CARE_PROVIDER_SITE_OTHER): Admitting: Family Medicine

## 2024-02-19 ENCOUNTER — Ambulatory Visit: Admitting: Family Medicine

## 2024-02-19 DIAGNOSIS — N760 Acute vaginitis: Secondary | ICD-10-CM | POA: Insufficient documentation

## 2024-02-19 DIAGNOSIS — L0292 Furuncle, unspecified: Secondary | ICD-10-CM | POA: Diagnosis not present

## 2024-02-19 MED ORDER — FLUCONAZOLE 150 MG PO TABS: 150.0000 mg | ORAL_TABLET | Freq: Every day | ORAL | 0 refills | Status: DC

## 2024-02-19 MED ORDER — DOXYCYCLINE HYCLATE 100 MG PO TABS: 100.0000 mg | ORAL_TABLET | Freq: Two times a day (BID) | ORAL | 0 refills | Status: DC

## 2024-02-19 NOTE — Telephone Encounter (Signed)
 Home health information/ phone number not on this message.  Will try to confirm information with patient tomorrow

## 2024-02-19 NOTE — Assessment & Plan Note (Signed)
 Diflucan  150 mg 1 po every day x1, may repeat in 3 days as needed  Pt will need in person visit if symptoms do not improve

## 2024-02-19 NOTE — Progress Notes (Signed)
 MyChart Video Visit    Virtual Visit via Video Note   This patient is at least at moderate risk for complications without adequate follow up. This format is felt to be most appropriate for this patient at this time. Physical exam was limited by quality of the video and audio technology used for the visit. Sue [name] was able to get the patient set up on a video visit.  Patient location: home with daughtr .   Patient and provider in visit Provider location: Office  I discussed the limitations of evaluation and management by telemedicine and the availability of in person appointments. The patient expressed understanding and agreed to proceed.  Visit Date: 02/19/2024  Today's healthcare provider: Estill Hemming, DO     Subjective:    Patient ID: Sue [name], female    DOB: Nov 17, 1950, 73 y.o.   MRN: [id number]  Chief Complaint  Patient presents with   Fall    Patient reports having a fall at end of April     Leg Pain    Complains of left and right leg pain since fall   Vaginal Pain    Patient reports having vaginal irritation and pain     HPI Patient is in today for c/o vaginal yeast and boils.   She was d/c from hospital in April s/p fall and rhabdomyolysis.  She still c/o R leg pain but it has improved.  She now c/o vaginal irritation from antibiotic-- cipro  that she received at the hospital and some boils have developed in her groin .   No other complaints   History of Present Illness     Past Medical History:  Diagnosis Date   Allergic rhinitis    Depression    Diabetes mellitus type 2, uncontrolled    Emphysema of lung (HCC)    3L home O2   GERD (gastroesophageal reflux disease)    Hypertension    Hypothyroidism    Obesity, morbid, BMI 50 or higher (HCC)    Stroke (HCC) 2016   TIA    Urine incontinence     Past Surgical History:  Procedure Laterality Date   ABDOMINAL HYSTERECTOMY     CESAREAN SECTION     COLONOSCOPY N/A 08/19/2019   Procedure:  COLONOSCOPY;  Surgeon: Ozell Blunt, MD;  Location: [facility];  Service: Endoscopy;  Laterality: N/A;   COLONOSCOPY WITH PROPOFOL  N/A 08/05/2019   Procedure: COLONOSCOPY WITH PROPOFOL ;  Surgeon: Baldo Bonds, MD;  Location: [facility];  Service: Gastroenterology;  Laterality: N/A;   COLONOSCOPY WITH PROPOFOL  N/A 11/19/2019   Procedure: COLONOSCOPY WITH PROPOFOL ;  Surgeon: Lanita Pitman, MD;  Location: [facility];  Service: Endoscopy;  Laterality: N/A;  Unprepped   COLONOSCOPY WITH PROPOFOL  N/A 11/22/2019   Procedure: COLONOSCOPY WITH PROPOFOL ;  Surgeon: Lanita Pitman, MD;  Location: [facility];  Service: Endoscopy;  Laterality: N/A;   COLONOSCOPY WITH PROPOFOL  N/A 11/23/2019   Procedure: COLONOSCOPY WITH PROPOFOL ;  Surgeon: Genell Ken, MD;  Location: [facility];  Service: Gastroenterology;  Laterality: N/A;   COLONOSCOPY WITH PROPOFOL  N/A 11/29/2019   Procedure: COLONOSCOPY WITH PROPOFOL ;  Surgeon: Baldo Bonds, MD;  [facility];  Service: Endoscopy;  Laterality: N/A;   ENTEROSCOPY N/A 11/24/2019   Procedure: ENTEROSCOPY;  Surgeon: Genell Ken, MD;  [facility];  Service: Gastroenterology;  Laterality: N/A;   ENTEROSCOPY N/A 11/27/2019   Procedure: ENTEROSCOPY;  Surgeon: Baldo Bonds, MD;  [facility]: [facility];  Service: Endoscopy;  Laterality: N/A;   ESOPHAGOGASTRODUODENOSCOPY N/A  11/27/2019   Procedure: ESOPHAGOGASTRODUODENOSCOPY (EGD);  Surgeon: Baldo Bonds, MD;  Location: [facility];  Service: Endoscopy;  Laterality: N/A;   ESOPHAGOGASTRODUODENOSCOPY (EGD) WITH PROPOFOL  N/A 11/24/2019   Procedure: ESOPHAGOGASTRODUODENOSCOPY (EGD) WITH PROPOFOL ;  Surgeon: Genell Ken, MD;  [facility];  Service: Gastroenterology;  Laterality: N/A;  PUSH enteroscopy   GIVENS CAPSULE STUDY N/A 11/19/2019   Procedure: GIVENS CAPSULE STUDY;  Surgeon: Lanita Pitman, MD;  [facility];  Service: Endoscopy;  Laterality: N/A;  To be performed  immediately following colonoscopy   GIVENS CAPSULE STUDY N/A 11/24/2019   Procedure: GIVENS CAPSULE STUDY;  Surgeon: Genell Ken, MD;  [facility];  Service: Gastroenterology;  Laterality: N/A;   GIVENS CAPSULE STUDY N/A 11/28/2019   Procedure: GIVENS CAPSULE STUDY;  Surgeon: Baldo Bonds, MD;  [facility];  Service: Endoscopy;  Laterality: N/A;   HEMOSTASIS CLIP PLACEMENT  11/19/2019   Procedure: HEMOSTASIS CLIP PLACEMENT;  Surgeon: Lanita Pitman, MD;  [facility];  Service: Endoscopy;;   HEMOSTASIS CLIP PLACEMENT  11/22/2019   Procedure: HEMOSTASIS CLIP PLACEMENT;  Surgeon: Lanita Pitman, MD;  [facility];  Service: Endoscopy;;   HOT HEMOSTASIS N/A 11/24/2019   Procedure: HOT HEMOSTASIS (ARGON PLASMA COAGULATION/BICAP);  Surgeon: Genell Ken, MD;  Location: [facility];  Service: Gastroenterology;  Laterality: N/A;   HOT HEMOSTASIS N/A 11/27/2019   Procedure: HOT HEMOSTASIS (ARGON PLASMA COAGULATION/BICAP);  Surgeon: Baldo Bonds, MD;  Location: [facility];  Service: Endoscopy;  Laterality: N/A;   SUBMUCOSAL TATTOO INJECTION  11/19/2019   Procedure: SUBMUCOSAL TATTOO INJECTION;  Surgeon: Lanita Pitman, MD;  [facility];  Service: Endoscopy;;    Family History  Problem Relation Age of Onset   Heart disease Father        MVP and Pics Valve   Hypertension Father    Depression Father        Institutionalized x's 2 years   Bipolar disorder Father    Hypertension Sister    Diabetes Sister    Hyperlipidemia Sister    Heart disease Sister 68       MI   Heart disease Brother    Hypertension Brother    Schizophrenia Paternal Aunt    Depression Paternal Aunt    Anxiety disorder Paternal Aunt    Heart disease Paternal Aunt    Schizophrenia Paternal Aunt    Heart disease Paternal Uncle    Heart disease Paternal Grandmother    Asthma Son    Asthma Son     Social History   Socioeconomic History   Marital status:  Divorced    Spouse name: Not on file   Number of children: 4   Years of education: 14   Highest education level: 12th grade  Occupational History   Occupation: disabled- Biochemist, clinical Med  Tobacco Use   Smoking status: Former    Current packs/day: 0.00    Average packs/day: 1 pack/day for 40.0 years (40.0 ttl pk-yrs)    Types: Cigarettes    Start date: 07/21/1972    Quit date: 10/16/2011    Years since quitting: 12.3   Smokeless tobacco: Never  Vaping Use   Vaping status: Never Used  Substance and Sexual Activity   Alcohol  use: Not Currently    Comment: Occ-- Wine   Drug use: No   Sexual activity: Not Currently  Other Topics Concern   Not on file  Social History Narrative      Originally from [place]. Has also lived in [place], [place]. She  also previously lived in [place] for 20 years. No history of Valley Fever. Moved to [place] in 1989. 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 No pets currently. No bird exposure. Questionable previous mold exposure in her daughter's home. 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       Lives in [place] alone in assisted living.  Has multiple chronic illness.      Are you right handed or left handed? Right handed   Are you currently employed ? no   What is your current occupation? retired   Do you live at home alone?no   Who lives with you? son   What type of home do you live in: 1 story or 2 story? Apartment first floor       Social Drivers of Health   Financial Resource Strain: Low Risk  (02/19/2024)   Overall Financial Resource Strain (CARDIA)    Difficulty of Paying Living Expenses: Not hard at all  Food Insecurity: No Food Insecurity (02/19/2024)   Hunger Vital Sign    Worried About Running Out of Food in the Last Year:  Never true    Ran Out of Food in the Last Year: Never true  Transportation Needs: No Transportation Needs (02/19/2024)   PRAPARE - Administrator, Civil Service (Medical): No    Lack of Transportation (Non-Medical): No  Physical Activity: Insufficiently Active (02/19/2024)   Exercise Vital Sign    Days of Exercise per Week: 2 days    Minutes of Exercise per Session: 20 min  Stress: No Stress Concern Present (02/19/2024)   [employer] of Occupational Health - Occupational Stress Questionnaire    Feeling of Stress : Only a little  Social Connections: Unknown (01/02/2024)   Social Connection and Isolation Panel [NHANES]    Frequency of Communication with Friends and Family: Three times a week    Frequency of Social Gatherings with Friends and Family: Three times a week    Attends Religious Services: Patient declined    Active Member of Clubs or Organizations: Yes    Attends Banker Meetings: More than 4 times per year    Marital Status: Divorced  Intimate Partner Violence: Not At Risk (12/31/2023)   Humiliation, Afraid, Rape, and Kick questionnaire    Fear of Current or Ex-Partner: No    Emotionally Abused: No    Physically Abused: No    Sexually Abused: No    Outpatient Medications Prior to Visit  Medication Sig Dispense Refill   Accu-Chek Softclix Lancets lancets USE 1  TO CHECK GLUCOSE 4 TIMES DAILY 100 each 2   albuterol  (VENTOLIN  HFA) 108 (90 Base) MCG/ACT inhaler Inhale 1-2 puffs into the lungs every 6 (six) hours as needed for wheezing or shortness of breath. (Patient not taking: Reported on 02/06/2024) 1 each 0   aspirin  EC 81 MG tablet Take 1 tablet (81 mg total) by mouth daily. Swallow whole. 30 tablet 12   Blood Glucose Monitoring Suppl (ACCU-CHEK GUIDE) w/Device KIT USE   TO CHECK GLUCOSE UP TO 4 TIMES DAILY AS DIRECTED 1 kit 0   busPIRone  (BUSPAR ) 15 MG tablet Take 1 tablet (15 mg total) by mouth 2 (two) times daily. 60 tablet 3   Dulaglutide   (TRULICITY ) 3 MG/0.5ML SOAJ  Inject 3 mg into the skin once a week. 6 mL 3   glucose blood (ACCU-CHEK GUIDE) test strip USE 1 STRIP TO CHECK GLUCOSE 4 TIMES DAILY 200 each 12   insulin  aspart (NOVOLOG  FLEXPEN) 100 UNIT/ML FlexPen Novolog  10 units with each meal PLUS the scale if needed -Novolog  correctional insulin : ADD extra units on insulin  to your meal-time Novolog  dose if your blood sugars are higher than 160. Use the scale below to help guide you:  Blood sugar before meal Number of units to inject Less than 160 0 unit 161 -  190 1 units 191 -  220 2 units 221 -  250 3 units 251 -  280 4 units 281 -  310 5 units 311 -  340 6 units 341 -  370 7 units 371 -  400 8 units Max daily 45 units 45 mL 3   insulin  glargine, 1 Unit Dial, (TOUJEO  SOLOSTAR) 300 UNIT/ML Solostar Pen Inject 40 Units into the skin at bedtime. 6 mL 1   Insulin  Pen Needle (BD PEN NEEDLE NANO U/F) 32G X 4 MM MISC Use to inject insulin  200 each 3   levothyroxine  (SYNTHROID ) 100 MCG tablet Take 1 tablet (100 mcg total) by mouth daily before breakfast. 90 tablet 3   lidocaine  (LIDODERM ) 5 % Place 1 patch onto the skin daily. Remove & Discard patch within 12 hours or as directed by MD 30 patch 0   LORazepam  (ATIVAN ) 1 MG tablet TAKE 1/2 TO 1 AND 1/2 TABLETS AT BEDTIME AND AN ADDITIONAL 1/2 TAB ONCE A DAY AS NEEDED FOR ANXIETY 60 tablet 3   magic mouthwash w/lidocaine  SOLN Take 5 mLs by mouth 4 (four) times daily as needed for mouth pain. Swish and Spit 240 mL 0   metoprolol  succinate (TOPROL -XL) 100 MG 24 hr tablet Take 1 tablet (100 mg total) by mouth daily. Take with or immediately following a meal. 90 tablet 3   NONFORMULARY OR COMPOUNDED ITEM Beside commode #1  frequent falls, weakness 1 each 0   oxyCODONE  (ROXICODONE ) 5 MG immediate release tablet Take 1 tablet (5 mg total) by mouth every 6 (six) hours as needed. 9 tablet 0   OXYGEN  Inhale 3-4 L/min into the lungs continuous.      pantoprazole  (PROTONIX ) 40 MG tablet Take 1 tablet (40  mg total) by mouth 2 (two) times daily. 60 tablet 1   potassium chloride  SA (KLOR-CON  M) 20 MEQ tablet Take 2 tablets (40 mEq total) by mouth daily. 60 tablet 0   pregabalin  (LYRICA ) 100 MG capsule Take 1 capsule (100 mg total) by mouth at bedtime. 30 capsule 0   QUEtiapine  (SEROQUEL ) 200 MG tablet Take 1 tablet (200 mg total) by mouth at bedtime. 30 tablet 1   rosuvastatin  (CRESTOR ) 10 MG tablet Take 1 tablet (10 mg total) by mouth daily. 90 tablet 0   senna-docusate (SENOKOT-S) 8.6-50 MG tablet Take 1 tablet by mouth daily. 30 tablet 0   zolpidem  (AMBIEN  CR) 12.5 MG CR tablet Take 1 tablet (12.5 mg total) by mouth at bedtime as needed. for sleep 30 tablet 3   torsemide  (DEMADEX ) 20 MG tablet Take 60 mg by mouth daily.     No facility-administered medications prior to visit.    Allergies  Allergen Reactions   Hydrocodone  Other (See Comments)    Constipation and hallucinations   Norvasc  [Amlodipine  Besylate] Swelling and Other (See Comments)    Marked swelling of the limbs   Tizanidine  Other (See Comments)  After the 3rd dose, the patient's mouth began to feel numb and she felt like she was a having a "hot flash"    Review of Systems  Constitutional:  Negative for fever and malaise/fatigue.  HENT:  Negative for congestion.   Eyes:  Negative for blurred vision.  Respiratory:  Negative for shortness of breath.   Cardiovascular:  Negative for chest pain, palpitations and leg swelling.  Gastrointestinal:  Negative for abdominal pain, blood in stool and nausea.  Genitourinary:  Negative for dysuria and frequency.       Vaginal itching / d/c  Musculoskeletal:  Negative for falls.  Skin:  Negative for rash.       Boils in groin   Neurological:  Negative for dizziness, loss of consciousness and headaches.  Endo/Heme/Allergies:  Negative for environmental allergies.  Psychiatric/Behavioral:  Negative for depression. The patient is not nervous/anxious.        Objective:      Physical Exam Vitals and nursing note reviewed.  Pulmonary:     Effort: Pulmonary effort is normal.     Comments: On O2 Neurological:     Mental Status: She is alert.    There were no vitals taken for this visit. Wt Readings from Last 3 Encounters:  02/06/24 258 lb 3.2 oz (117.1 kg)  12/31/23 260 lb (117.9 kg)  12/29/23 270 lb (122.5 kg)       Assessment & Plan:  Boil Assessment & Plan: Doxycycline  100 mg bid x 10 days Warm compresses  In person visit if no improvement   Orders: -     Doxycycline  Hyclate; Take 1 tablet (100 mg total) by mouth 2 (two) times daily.  Dispense: 20 tablet; Refill: 0  Acute vaginitis Assessment & Plan: Diflucan  150 mg 1 po every day x1, may repeat in 3 days as needed  Pt will need in person visit if symptoms do not improve    Other orders -     Fluconazole ; Take 1 tablet (150 mg total) by mouth daily. May repeat in 3 days if needed.  Dispense: 2 tablet; Refill: 0     I discussed the assessment and treatment plan with the patient. The patient was provided an opportunity to ask questions and all were answered. The patient agreed with the plan and demonstrated an understanding of the instructions.   The patient was advised to call back or seek an in-person evaluation if the symptoms worsen or if the condition fails to improve as anticipated.  Estill Hemming, DO [facility] at [facility] [phone] (phone) [phone] (fax)  [facility]

## 2024-02-19 NOTE — Assessment & Plan Note (Signed)
 Doxycycline  100 mg bid x 10 days Warm compresses  In person visit if no improvement

## 2024-02-21 ENCOUNTER — Ambulatory Visit (HOSPITAL_COMMUNITY)

## 2024-02-21 NOTE — Telephone Encounter (Signed)
 Spoke to patient's daughter Justine Oms, she will contact cardiology to get clear instructions from them.

## 2024-02-21 NOTE — Telephone Encounter (Signed)
 Per patient HH will be at her home later today and to call later about noon

## 2024-02-22 DIAGNOSIS — N179 Acute kidney failure, unspecified: Secondary | ICD-10-CM | POA: Diagnosis not present

## 2024-02-22 DIAGNOSIS — I5032 Chronic diastolic (congestive) heart failure: Secondary | ICD-10-CM | POA: Diagnosis not present

## 2024-02-22 DIAGNOSIS — G9341 Metabolic encephalopathy: Secondary | ICD-10-CM | POA: Diagnosis not present

## 2024-02-22 DIAGNOSIS — T796XXD Traumatic ischemia of muscle, subsequent encounter: Secondary | ICD-10-CM | POA: Diagnosis not present

## 2024-02-22 DIAGNOSIS — I214 Non-ST elevation (NSTEMI) myocardial infarction: Secondary | ICD-10-CM | POA: Diagnosis not present

## 2024-02-22 DIAGNOSIS — J439 Emphysema, unspecified: Secondary | ICD-10-CM | POA: Diagnosis not present

## 2024-02-22 DIAGNOSIS — N1832 Chronic kidney disease, stage 3b: Secondary | ICD-10-CM | POA: Diagnosis not present

## 2024-02-22 DIAGNOSIS — I130 Hypertensive heart and chronic kidney disease with heart failure and stage 1 through stage 4 chronic kidney disease, or unspecified chronic kidney disease: Secondary | ICD-10-CM | POA: Diagnosis not present

## 2024-02-22 DIAGNOSIS — E1122 Type 2 diabetes mellitus with diabetic chronic kidney disease: Secondary | ICD-10-CM | POA: Diagnosis not present

## 2024-02-23 ENCOUNTER — Telehealth: Payer: Self-pay

## 2024-02-23 NOTE — Telephone Encounter (Signed)
 Copied from CRM [id number]. Topic: Clinical - Home Health Verbal Orders >> Feb 22, 2024  3:35 PM Mesmerise C wrote: Caller/Agency: [facility] Callback Number: [phone] Service Requested: Skilled Nursing Frequency: Discontinued Any new concerns about the patient? Yes Denise from [facility] discontinued nursing stated patient is putting on weight and  non compliant with diet not weighing everyday, not demonstrating any improvement, her weight for today, she's able to verbalize everything she should be doing but chooses not to is 259.6lbs after breakfast

## 2024-02-26 NOTE — Progress Notes (Deleted)
 I saw Sue [name] in neurology clinic on 03/07/24 in follow up for neuropathy.  HPI: Sue [name] is a 73 y.o. year old female with a history of DM, HFpEF, hypothyroidism, HTN, COPD, depression, anxiety, possible TIA in 2016, and former smoker who we last saw on 05/05/23.  To briefly review: Initial consultation (06/09/22): Symptoms started about 11/2021. She has a burning and stinging pain in her feet and legs. She feels like something is squeezing her legs. She has the feeling of [place]orses in her legs. She feels that the left side is worse than the right. She reports that her left leg pain is near constant while the right seems more intermittent. The pain is mostly from the calf down, but occasional cramping in her thighs. She denies back pain, neck pain, and only rare headaches. She denies numbness or tingling in her hands or arms. Patient walks with a walker for the last 5 years (~2018). She has not had any recent falls.   Patient saw PCP (Dr. Crecencio Dodge) on 03/14/22 and mentioned stinging and burning in legs that was worse in the evening. She denied back pain. Patient was tried on gabapentin  100 mg that did not help. She did not feel good on the medication, so she stopped it. She took it about a week. She was referred to neurology for further evaluation.   Patient is on seroquel  200 mg at night to help sleep.   She takes vitamin D  and vitamin B12 supplements. She is also taking a nerve supplement from [employer] Lorelei Rogers) that she thinks is helping.    The patient denies symptoms suggestive of oculobulbar weakness including diplopia, ptosis, dysphagia, poor saliva control, dysarthria/dysphonia, impaired mastication, facial weakness/droop.   The patient does not report symptoms referable to autonomic dysfunction including impaired sweating, heat or cold intolerance, excessive mucosal dryness, gastroparetic early satiety, postprandial abdominal bloating, constipation, bowel or bladder  dyscontrol, or syncope/presyncope/orthostatic intolerance.   She does not report any constitutional symptoms like fever, night sweats, anorexia or unintentional weight loss.   EtOH use: None  Restrictive diet? No Family history of neuropathy/myopathy/NM disease? None   She had not had any physical therapy recently (as of 06/09/22).   02/03/23: MMA and B6 were normal. IFE was indeterminate. EMG on 11/27/21 showed a possible mild sensory predominant neuropathy (vs age related changes). Patient was not getting adequate relief from gabapentin , so was switched to Lyrica  25 mg BID on 11/30/22.    Patient was to have a virtual visit on 12/14/22, but technical problems did not allow us  to connect. Per phone conversation, patient was doing okay without acute issues, so follow up was rescheduled.   Patient called on 01/06/23 still having leg pain. Lyrica  was increased to 50 mg BID on 01/06/23.    Patient is still having a lot of pain. She has pain in feet all the way into the chest. It is a stabbing pain or a bad itch. It can go from feet to thighs. It can wake her up from her sleep. Most of the pain is in her feet and can go up to knee. She is having a lot of knee pain and has had to have injections previously. She has not had the injections in over a year. She has not talked to anyone about this. She is not moving around well currently. She does not feel she is eating well. She has lost about 50 pounds in the last month or so, which she  attributes to not eating. She denies fever or night sweats.   Patient usually has edema in her legs, but now does not have edema and has a lot of pain.    Lyrica  was increased to 50 mg TID on 02/03/23.   05/05/23: Labs showed HbA1c of 9.9 (up from 7.7 one year prior), IFE with no M protein, and normal B1. I recommended patient discuss glucose control with PCP to prevent worsening neuropathy among other complications. Patient has not yet seen her DM doctor.   Patient continues  to have burning and stabbing pain in her feet. After increasing Lyrica  to 50 mg TID, patient did have some improvement at first, but things got worse again. She is not having side effects to the medication.   Patient was bit by a spider a few months ago in the back of the neck. She is not sure it is healing well. She may have some numbness in her right arm since, more in the 4th and 5th digits.  Most recent Assessment and Plan (05/05/23): This is NYAZIA [name], a 73 y.o. female with pain in bilateral lower extremities. 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 She has previously failed gabapentin  and continues to have increase in pain despite increasing doses of Lyrica . She does get some relief, but it does not last long enough. We discussed her worsening HbA1c today (9.9 from 7.7 a year prior) that is likely driving the worsening neuropathy. Patient understands that Lyrica  may help with neuropathy, but glucose control is the key to preventing continued worsening. Finally, she describes significant knee pain, so perhaps there is an overlapping MSK component as well.   Plan: -Will increase Lyrica  to 50mg /50mg /100mg  -Patient will discuss DM management with endocrinologist -Patient will discuss bite on neck with PCP, does not look infected to me though -Will monitor RUE symptoms closely  Since their last visit: ***  Patient had a fall and on the ground for > 24 hours. She was hospitalized and found to have elevated CK that improved with hydration. She also had fever and leukocytosis and received antibiotics. Short term inpatient rehab was recommended but patient declined and was discharged home with home health.  ***  ROS: Pertinent positive and negative systems reviewed in HPI. ***   MEDICATIONS:  Outpatient Encounter Medications as of 03/07/2024  Medication Sig   Accu-Chek Softclix Lancets lancets USE 1  TO CHECK GLUCOSE 4 TIMES DAILY   albuterol  (VENTOLIN  HFA) 108 (90 Base)  MCG/ACT inhaler Inhale 1-2 puffs into the lungs every 6 (six) hours as needed for wheezing or shortness of breath. (Patient not taking: Reported on 02/06/2024)   aspirin  EC 81 MG tablet Take 1 tablet (81 mg total) by mouth daily. Swallow whole.   Blood Glucose Monitoring Suppl (ACCU-CHEK GUIDE) w/Device KIT USE   TO CHECK GLUCOSE UP TO 4 TIMES DAILY AS DIRECTED   busPIRone  (BUSPAR ) 15 MG tablet Take 1 tablet (15 mg total) by mouth 2 (two) times daily.   doxycycline  (VIBRA -TABS) 100 MG tablet Take 1 tablet (100 mg total) by mouth 2 (two) times daily.   Dulaglutide  (TRULICITY ) 3 MG/0.5ML SOAJ Inject 3 mg into the skin once a week.   fluconazole  (DIFLUCAN ) 150 MG tablet Take 1 tablet (150 mg total) by mouth daily. May repeat in 3 days if needed.   glucose blood (ACCU-CHEK GUIDE) test strip USE 1 STRIP TO CHECK GLUCOSE 4 TIMES DAILY   insulin  aspart (NOVOLOG  FLEXPEN) 100 UNIT/ML FlexPen Novolog  10  units with each meal PLUS the scale if needed -Novolog  correctional insulin : ADD extra units on insulin  to your meal-time Novolog  dose if your blood sugars are higher than 160. Use the scale below to help guide you:  Blood sugar before meal Number of units to inject Less than 160 0 unit 161 -  190 1 units 191 -  220 2 units 221 -  250 3 units 251 -  280 4 units 281 -  310 5 units 311 -  340 6 units 341 -  370 7 units 371 -  400 8 units Max daily 45 units   insulin  glargine, 1 Unit Dial, (TOUJEO  SOLOSTAR) 300 UNIT/ML Solostar Pen Inject 40 Units into the skin at bedtime.   Insulin  Pen Needle (BD PEN NEEDLE NANO U/F) 32G X 4 MM MISC Use to inject insulin    levothyroxine  (SYNTHROID ) 100 MCG tablet Take 1 tablet (100 mcg total) by mouth daily before breakfast.   lidocaine  (LIDODERM ) 5 % Place 1 patch onto the skin daily. Remove & Discard patch within 12 hours or as directed by MD   LORazepam  (ATIVAN ) 1 MG tablet TAKE 1/2 TO 1 AND 1/2 TABLETS AT BEDTIME AND AN ADDITIONAL 1/2 TAB ONCE A DAY AS NEEDED FOR ANXIETY   magic  mouthwash w/lidocaine  SOLN Take 5 mLs by mouth 4 (four) times daily as needed for mouth pain. Swish and Spit   metoprolol  succinate (TOPROL -XL) 100 MG 24 hr tablet Take 1 tablet (100 mg total) by mouth daily. Take with or immediately following a meal.   NONFORMULARY OR COMPOUNDED ITEM Beside commode #1  frequent falls, weakness   oxyCODONE  (ROXICODONE ) 5 MG immediate release tablet Take 1 tablet (5 mg total) by mouth every 6 (six) hours as needed.   OXYGEN  Inhale 3-4 L/min into the lungs continuous.    pantoprazole  (PROTONIX ) 40 MG tablet Take 1 tablet (40 mg total) by mouth 2 (two) times daily.   potassium chloride  SA (KLOR-CON  M) 20 MEQ tablet Take 2 tablets (40 mEq total) by mouth daily.   pregabalin  (LYRICA ) 100 MG capsule Take 1 capsule (100 mg total) by mouth at bedtime.   QUEtiapine  (SEROQUEL ) 200 MG tablet Take 1 tablet (200 mg total) by mouth at bedtime.   rosuvastatin  (CRESTOR ) 10 MG tablet Take 1 tablet (10 mg total) by mouth daily.   senna-docusate (SENOKOT-S) 8.6-50 MG tablet Take 1 tablet by mouth daily.   torsemide  (DEMADEX ) 20 MG tablet Take 2 tablets (40 mg total) by mouth every evening.   zolpidem  (AMBIEN  CR) 12.5 MG CR tablet Take 1 tablet (12.5 mg total) by mouth at bedtime as needed. for sleep   No facility-administered encounter medications on file as of 03/07/2024.    PAST MEDICAL HISTORY: Past Medical History:  Diagnosis Date   Allergic rhinitis    Depression    Diabetes mellitus type 2, uncontrolled    Emphysema of lung (HCC)    3L home O2   GERD (gastroesophageal reflux disease)    Hypertension    Hypothyroidism    Obesity, morbid, BMI 50 or higher (HCC)    Stroke (HCC) 2016   TIA    Urine incontinence     PAST SURGICAL HISTORY: Past Surgical History:  Procedure Laterality Date   ABDOMINAL HYSTERECTOMY     CESAREAN SECTION     COLONOSCOPY N/A 08/19/2019   Procedure: COLONOSCOPY;  Surgeon: Ozell Blunt, MD;  Location: [facility];  Service: Endoscopy;   Laterality: N/A;   COLONOSCOPY WITH PROPOFOL   N/A 08/05/2019   Procedure: COLONOSCOPY WITH PROPOFOL ;  Surgeon: Baldo Bonds, MD;  Location: [facility];  Service: Gastroenterology;  Laterality: N/A;   COLONOSCOPY WITH PROPOFOL  N/A 11/19/2019   Procedure: COLONOSCOPY WITH PROPOFOL ;  Surgeon: Lanita Pitman, MD;  Location: [facility];  Service: Endoscopy;  Laterality: N/A;  Unprepped   COLONOSCOPY WITH PROPOFOL  N/A 11/22/2019   Procedure: COLONOSCOPY WITH PROPOFOL ;  Surgeon: Lanita Pitman, MD;  Location: [facility];  Service: Endoscopy;  Laterality: N/A;   COLONOSCOPY WITH PROPOFOL  N/A 11/23/2019   Procedure: COLONOSCOPY WITH PROPOFOL ;  Surgeon: Genell Ken, MD;  Location: [facility];  Service: Gastroenterology;  Laterality: N/A;   COLONOSCOPY WITH PROPOFOL  N/A 11/29/2019   Procedure: COLONOSCOPY WITH PROPOFOL ;  Surgeon: Baldo Bonds, MD;  Location: [facility];  Service: Endoscopy;  Laterality: N/A;   ENTEROSCOPY N/A 11/24/2019   Procedure: ENTEROSCOPY;  Surgeon: Genell Ken, MD;  Location: [facility];  Service: Gastroenterology;  Laterality: N/A;   ENTEROSCOPY N/A 11/27/2019   Procedure: ENTEROSCOPY;  Surgeon: Baldo Bonds, MD;  Location: [facility];  Service: Endoscopy;  Laterality: N/A;   ESOPHAGOGASTRODUODENOSCOPY N/A 11/27/2019   Procedure: ESOPHAGOGASTRODUODENOSCOPY (EGD);  Surgeon: Baldo Bonds, MD;  Location: [facility];  Service: Endoscopy;  Laterality: N/A;   ESOPHAGOGASTRODUODENOSCOPY (EGD) WITH PROPOFOL  N/A 11/24/2019   Procedure: ESOPHAGOGASTRODUODENOSCOPY (EGD) WITH PROPOFOL ;  Surgeon: Genell Ken, MD;  Location: [facility];  Service: Gastroenterology;  Laterality: N/A;  PUSH enteroscopy   GIVENS CAPSULE STUDY N/A 11/19/2019   Procedure: GIVENS CAPSULE STUDY;  Surgeon: Lanita Pitman, MD;  [facility]: [facility];  Service: Endoscopy;  Laterality: N/A;  To be performed immediately following colonoscopy   GIVENS CAPSULE STUDY N/A 11/24/2019   Procedure:  GIVENS CAPSULE STUDY;  Surgeon: Genell Ken, MD;  Location: [facility];  Service: Gastroenterology;  Laterality: N/A;   GIVENS CAPSULE STUDY N/A 11/28/2019   Procedure: GIVENS CAPSULE STUDY;  Surgeon: Baldo Bonds, MD;  Location: [facility];  Service: Endoscopy;  Laterality: N/A;   HEMOSTASIS CLIP PLACEMENT  11/19/2019   Procedure: HEMOSTASIS CLIP PLACEMENT;  Surgeon: Lanita Pitman, MD;  Location: [facility];  Service: Endoscopy;;   HEMOSTASIS CLIP PLACEMENT  11/22/2019   Procedure: HEMOSTASIS CLIP PLACEMENT;  Surgeon: Lanita Pitman, MD;  Location: [facility];  Service: Endoscopy;;   HOT HEMOSTASIS N/A 11/24/2019   Procedure: HOT HEMOSTASIS (ARGON PLASMA COAGULATION/BICAP);  Surgeon: Genell Ken, MD;  Location: [facility];  Service: Gastroenterology;  Laterality: N/A;   HOT HEMOSTASIS N/A 11/27/2019   Procedure: HOT HEMOSTASIS (ARGON PLASMA COAGULATION/BICAP);  Surgeon: Baldo Bonds, MD;  Location: [facility];  Service: Endoscopy;  Laterality: N/A;   SUBMUCOSAL TATTOO INJECTION  11/19/2019   Procedure: SUBMUCOSAL TATTOO INJECTION;  Surgeon: Lanita Pitman, MD;  Location: [facility];  Service: Endoscopy;;    ALLERGIES: Allergies  Allergen Reactions   Hydrocodone  Other (See Comments)    Constipation and hallucinations   Norvasc  [Amlodipine  Besylate] Swelling and Other (See Comments)    Marked swelling of the limbs   Tizanidine  Other (See Comments)    After the 3rd dose, the patient's mouth began to feel numb and she felt like she was a having a hot flash    FAMILY HISTORY: Family History  Problem Relation Age of Onset   Heart disease Father        MVP and Pics Valve   Hypertension Father    Depression Father        Institutionalized x's 2 years   Bipolar disorder Father    Hypertension Sister    Diabetes  Sister    Hyperlipidemia Sister    Heart disease Sister 77       MI   Heart disease Brother    Hypertension Brother    Schizophrenia Paternal Aunt     Depression Paternal Aunt    Anxiety disorder Paternal Aunt    Heart disease Paternal Aunt    Schizophrenia Paternal Aunt    Heart disease Paternal Uncle    Heart disease Paternal Grandmother    Asthma Son    Asthma Son     SOCIAL HISTORY: Social History   Tobacco Use   Smoking status: Former    Current packs/day: 0.00    Average packs/day: 1 pack/day for 40.0 years (40.0 ttl pk-yrs)    Types: Cigarettes    Start date: 07/21/1972    Quit date: 10/16/2011    Years since quitting: 12.3   Smokeless tobacco: Never  Vaping Use   Vaping status: Never Used  Substance Use Topics   Alcohol  use: Not Currently    Comment: Occ-- Wine   Drug use: No   Social History   Social History Narrative      Originally from [place]. Has also lived in [place], [place]. She also previously lived in [place] for 20 years. No history of Valley Fever. Moved to [place] in 1989. 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 No pets currently. No bird exposure. Questionable previous mold exposure in her daughter's home. 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       Lives in [place] alone in assisted living.  Has multiple chronic illness.      Are you right handed or left handed? Right handed   Are you currently employed ? no   What is your current occupation? retired   Do you live at home alone?no   Who lives with you? son   What type of home do you live in: 1 story or 2 story? Apartment first floor        Objective:  Vital Signs:  There were no vitals taken for this visit.  General:*** General appearance: Awake and alert. No distress. Cooperative with exam.  Skin: No obvious rash or jaundice. HEENT: Atraumatic. Anicteric. Lungs: Non-labored breathing on room air  Heart:  Regular Abdomen: Soft, non tender. Extremities: No edema. No obvious deformity.  Musculoskeletal: No obvious joint swelling.  Neurological: Mental Status: Alert. Speech fluent. No pseudobulbar affect Cranial Nerves: CNII: No RAPD. Visual fields intact. CNIII, IV, VI: PERRL. No nystagmus. EOMI. CN V: Facial sensation intact bilaterally to fine touch. [place] clench strong. Jaw jerk***. CN VII: Facial muscles symmetric and strong. No ptosis at rest or after sustained upgaze***. CN VIII: Hears finger rub well bilaterally. CN IX: No hypophonia. CN X: Palate elevates symmetrically. CN XI: Full strength shoulder shrug bilaterally. CN XII: Tongue protrusion full and midline. No atrophy or fasciculations. No significant dysarthria*** Motor: Tone is ***. *** fasciculations in *** extremities. *** atrophy. No grip or percussive myotonia.  Individual muscle group testing (MRC grade out of 5):  Movement     Neck flexion ***    Neck extension ***     Right Left   Shoulder abduction *** ***   Shoulder adduction *** ***   Shoulder ext rotation *** ***  Shoulder int rotation *** ***   Elbow flexion *** ***   Elbow extension *** ***   Wrist extension *** ***   Wrist flexion *** ***   Finger abduction - FDI *** ***   Finger abduction - ADM *** ***   Finger extension *** ***   Finger distal flexion - 2/3 *** ***   Finger distal flexion - 4/5 *** ***   Thumb flexion - FPL *** ***   Thumb abduction - APB *** ***    Hip flexion *** ***   Hip extension *** ***   Hip adduction *** ***   Hip abduction *** ***   Knee extension *** ***   Knee flexion *** ***   Dorsiflexion *** ***   Plantarflexion *** ***   Inversion *** ***   Eversion *** ***   Great toe extension *** ***   Great toe flexion *** ***     Reflexes:  Right Left  Bicep *** ***  Tricep *** ***  BrRad *** ***  Knee *** ***  Ankle *** ***   Pathological Reflexes: Babinski: *** response bilaterally*** Hoffman:  *** Troemner: *** Pectoral: *** Palmomental: *** Facial: *** Midline tap: *** Sensation: Pinprick: *** Vibration: *** Temperature: *** Proprioception: *** Coordination: Intact finger-to- nose-finger and heel-to-shin bilaterally. Romberg negative.*** Gait: Able to rise from chair with arms crossed unassisted. Normal, narrow-based gait. Able to tandem walk. Able to walk on toes and heels.***   Lab and Test Review: New results: 08/21/23: TSH wnl Free T4 wnl HbA1c: 13.5  HbA1c (11/07/23): 10.3 CK (12/31/23): 1427 CK (01/01/24): 1021 CK (01/02/24): 606 CK (01/04/24): 325 CK (01/07/24): 94  02/06/24: CBC significant for MCV 98 BMP significant for Cr 1.70  CT head wo contrast (01/04/24): IMPRESSION: 1. No acute intracranial abnormality. 2. Chronic right maxillary sinusitis.  Cervical spine xray (11/07/23): FINDINGS: Broad-based reversal of normal lordosis. No listhesis. No evidence of acute fracture. The posterior elements are well aligned. Mild disc space narrowing and spurring C5-C6 and C6-C7. Bilateral C4-C5 facet hypertrophy, left greater than right. No prevertebral soft tissue thickening.   IMPRESSION: 1. No radiographic evidence of fracture or subluxation of the cervical spine. 2. Mild degenerative disc disease at C5-C6 and C6-C7. 3. Bilateral C4-C5 facet hypertrophy, left greater than right.   Previously reviewed results: 02/03/23: IFE: no M protein HbA1c: 9.9 B1 wnl   03/03/23: CBC significant for chronic anemia (Hb 11.9) CMP significant for K of 3.3 (chronic), Cr 1.63 (chronic) TSH mildly elevated at 5.94   06/09/22: IFE: poorly defined area of restricted protein mobility (reactive with IgG and kappa) B6 wnl MMA wnl   TSH (09/01/22): 5.455 BMP (10/28/22): significant for K 3.0, glucose 382, Cr 1.95 CMP (12/23/22): K of 2.9, glucose 431, Cr 1.76, AST 47, ALT 23 CBC (10/28/22): unremarkable  Normal or unremarkable:  03/14/22: TSH: 15.21 HbA1c: 7.7 CMP  significant for elevated glucose (201) and Cr (1.38) CBC significant for elevated MCV (106.3) B12 (01/07/20): 448   EMG (11/28/22): NCV & EMG Findings: Extensive electrodiagnostic evaluation of the left lower limb with additional nerve conduction studies of the left upper limb shows: Left sural and superficial peroneal/fibular sensory responses are absent. Left median, ulnar, and radial sensory responses are within normal limits. Left peroneal/fibular (EDB), tibial (AH), median (APB), and ulnar (ADM) motor responses are within normal limits. Left H reflex is absent. There is no evidence of active or chronic motor axon loss changes affecting any of the tested muscles on needle examination. Motor  unit configuration and recruitment pattern is within normal limits.   Impression: The findings above are most consistent with the following: Absent lower limb sensory responses and H reflex may be normal at this age or represent evidence of a large fiber sensory predominant neuropathy. No electrodiagnostic evidence of a left lumbosacral (L3-S1) motor radiculopathy. No electrodiagnostic evidence of a left median mononeuropathy at or distal to the wrist (ie: carpal tunnel syndrome). Screening studies for left ulnar and radial mononeuropathies are normal.   CT head wo contrast (for mental status change in setting of hyperglycemia) (10/28/22): FINDINGS: Brain: No acute territorial infarction, hemorrhage or intracranial mass. Mild atrophy. Nonenlarged ventricles.   Vascular: No hyperdense vessels.  Carotid vascular calcification.   Skull: Normal. Negative for fracture or focal lesion.   Sinuses/Orbits: Old fracture deformity medial wall left orbit. Moderate mucosal thickening in the right maxillary sinus with scattered calcific deposits.   Other: None.   IMPRESSION: No CT evidence for acute intracranial abnormality. Mild atrophy.   MRI brain wo contrast (01/01/20): CLINICAL DATA:  TIA. Transient  left facial numbness and slurred speech.   EXAM: MRI HEAD WITHOUT CONTRAST   TECHNIQUE: Multiplanar, multiecho pulse sequences of the brain and surrounding structures were obtained without intravenous contrast.   COMPARISON:  Head CT 01/01/2020 and MRI 07/30/2014   FINDINGS: Brain: There is no evidence of acute infarct, intracranial hemorrhage, mass, midline shift, or extra-axial fluid collection. The ventricles and sulci are normal. T2 hyperintensities in the cerebral white matter bilaterally are unchanged from the prior MRI and nonspecific but compatible with minimal chronic small vessel ischemic disease.   Vascular: Major intracranial vascular flow voids are preserved.   Skull and upper cervical spine: Unremarkable bone marrow signal.   Sinuses/Orbits: Remote medial left orbital fracture. Chronic right sphenoid sinusitis. Clear mastoid air cells.   Other: None.   IMPRESSION: 1. No acute intracranial abnormality. 2. Minimal chronic small vessel ischemic disease.  ASSESSMENT: This is [name], a 73 y.o. female with:  ***  Plan: ***  Return to clinic in ***  Total time spent reviewing records, interview, history/exam, documentation, and coordination of care on day of encounter:  *** min  Rommie Coats, MD

## 2024-03-04 ENCOUNTER — Other Ambulatory Visit: Payer: Self-pay | Admitting: Family Medicine

## 2024-03-04 DIAGNOSIS — N1832 Chronic kidney disease, stage 3b: Secondary | ICD-10-CM

## 2024-03-04 DIAGNOSIS — Z79899 Other long term (current) drug therapy: Secondary | ICD-10-CM

## 2024-03-04 DIAGNOSIS — E876 Hypokalemia: Secondary | ICD-10-CM

## 2024-03-07 ENCOUNTER — Ambulatory Visit: Admitting: Neurology

## 2024-03-22 ENCOUNTER — Encounter: Payer: Self-pay | Admitting: Behavioral Health

## 2024-03-22 ENCOUNTER — Telehealth: Admitting: Behavioral Health

## 2024-03-22 DIAGNOSIS — F5105 Insomnia due to other mental disorder: Secondary | ICD-10-CM

## 2024-03-22 DIAGNOSIS — F3131 Bipolar disorder, current episode depressed, mild: Secondary | ICD-10-CM

## 2024-03-22 DIAGNOSIS — F99 Mental disorder, not otherwise specified: Secondary | ICD-10-CM | POA: Diagnosis not present

## 2024-03-22 DIAGNOSIS — F411 Generalized anxiety disorder: Secondary | ICD-10-CM

## 2024-03-22 MED ORDER — LORAZEPAM 1 MG PO TABS: ORAL_TABLET | ORAL | 3 refills | Status: DC

## 2024-03-22 MED ORDER — ZOLPIDEM TARTRATE ER 12.5 MG PO TBCR: 12.5000 mg | EXTENDED_RELEASE_TABLET | Freq: Every evening | ORAL | 3 refills | Status: AC | PRN

## 2024-03-22 MED ORDER — BUSPIRONE HCL 15 MG PO TABS: 15.0000 mg | ORAL_TABLET | Freq: Two times a day (BID) | ORAL | 3 refills | Status: DC

## 2024-03-22 MED ORDER — QUETIAPINE FUMARATE 200 MG PO TABS: 200.0000 mg | ORAL_TABLET | Freq: Every day | ORAL | 1 refills | Status: DC

## 2024-03-22 NOTE — Progress Notes (Signed)
 Sue [name] [id number] 1950/09/29 73 y.o.  Virtual Visit via Video Note  I connected with pt @ on 03/22/24 at 10:30 AM EDT by a video enabled telemedicine application and verified that I am speaking with the correct person using two identifiers.   I discussed the limitations of evaluation and management by telemedicine and the availability of in person appointments. The patient expressed understanding and agreed to proceed.  I discussed the assessment and treatment plan with the patient. The patient was provided an opportunity to ask questions and all were answered. The patient agreed with the plan and demonstrated an understanding of the instructions.   The patient was advised to call back or seek an in-person evaluation if the symptoms worsen or if the condition fails to improve as anticipated.  I provided 30 minutes of non-face-to-face time during this encounter.  The patient was located at home.  The provider was located at [facility].   Sue DELENA Pizza, NP   Subjective:   Patient ID:  Sue [name] is a 73 y.o. (DOB 06-11-1951) female.  Chief Complaint:  Chief Complaint  Patient presents with   Anxiety   Follow-up   Patient Education   Depression   Medication Refill    HPI 73  year old female presents to this office via video visit for follow up and medication management.  No psychosocial changes this visit. Says that she was in hospital for 8 days in April. Says she rolled out of bed and laid in floor for 3 days before someone found her. She is now sleeping in chair. Following up with PCP and specialist regularly. She does not want to change or adjust medications today.  Just needing refills. Says she is doing ok.  Currently her anxiety level is 4/10 and depression is 3/10 but says that health issues keep her at this level.  She denies having any problems taking this medication. Denies any current mania which she said was dominate when she was younger. No  psychosis, no SI/HI. She has strong family hx of bipolar and schizophrenia on fathers side.    Unable to obtain prior medication list.       Review of Systems:  Review of Systems  Constitutional: Negative.   Allergic/Immunologic: Negative.   Neurological: Negative.   Psychiatric/Behavioral:  Positive for dysphoric mood. The patient is nervous/anxious.     Medications: I have reviewed the patient's current medications.  Current Outpatient Medications  Medication Sig Dispense Refill   Accu-Chek Softclix Lancets lancets USE 1  TO CHECK GLUCOSE 4 TIMES DAILY 100 each 2   albuterol  (VENTOLIN  HFA) 108 (90 Base) MCG/ACT inhaler Inhale 1-2 puffs into the lungs every 6 (six) hours as needed for wheezing or shortness of breath. (Patient not taking: Reported on 02/06/2024) 1 each 0   aspirin  EC 81 MG tablet Take 1 tablet (81 mg total) by mouth daily. Swallow whole. 30 tablet 12   Blood Glucose Monitoring Suppl (ACCU-CHEK GUIDE) w/Device KIT USE   TO CHECK GLUCOSE UP TO 4 TIMES DAILY AS DIRECTED 1 kit 0   busPIRone  (BUSPAR ) 15 MG tablet Take 1 tablet (15 mg total) by mouth 2 (two) times daily. 60 tablet 3   doxycycline  (VIBRA -TABS) 100 MG tablet Take 1 tablet (100 mg total) by mouth 2 (two) times daily. 20 tablet 0   Dulaglutide  (TRULICITY ) 3 MG/0.5ML SOAJ Inject 3 mg into the skin once a week. 6 mL 3   fluconazole  (DIFLUCAN ) 150 MG tablet Take 1  tablet (150 mg total) by mouth daily. May repeat in 3 days if needed. 2 tablet 0   glucose blood (ACCU-CHEK GUIDE) test strip USE 1 STRIP TO CHECK GLUCOSE 4 TIMES DAILY 200 each 12   insulin  aspart (NOVOLOG  FLEXPEN) 100 UNIT/ML FlexPen Novolog  10 units with each meal PLUS the scale if needed -Novolog  correctional insulin : ADD extra units on insulin  to your meal-time Novolog  dose if your blood sugars are higher than 160. Use the scale below to help guide you:  Blood sugar before meal Number of units to inject Less than 160 0 unit 161 -  190 1 units 191 -  220  2 units 221 -  250 3 units 251 -  280 4 units 281 -  310 5 units 311 -  340 6 units 341 -  370 7 units 371 -  400 8 units Max daily 45 units 45 mL 3   insulin  glargine, 1 Unit Dial, (TOUJEO  SOLOSTAR) 300 UNIT/ML Solostar Pen Inject 40 Units into the skin at bedtime. 6 mL 1   Insulin  Pen Needle (BD PEN NEEDLE NANO U/F) 32G X 4 MM MISC Use to inject insulin  200 each 3   levothyroxine  (SYNTHROID ) 100 MCG tablet Take 1 tablet (100 mcg total) by mouth daily before breakfast. 90 tablet 3   lidocaine  (LIDODERM ) 5 % Place 1 patch onto the skin daily. Remove & Discard patch within 12 hours or as directed by MD 30 patch 0   LORazepam  (ATIVAN ) 1 MG tablet TAKE 1/2 TO 1 AND 1/2 TABLETS AT BEDTIME AND AN ADDITIONAL 1/2 TAB ONCE A DAY AS NEEDED FOR ANXIETY 60 tablet 3   magic mouthwash w/lidocaine  SOLN Take 5 mLs by mouth 4 (four) times daily as needed for mouth pain. Swish and Spit 240 mL 0   metoprolol  succinate (TOPROL -XL) 100 MG 24 hr tablet Take 1 tablet (100 mg total) by mouth daily. Take with or immediately following a meal. 90 tablet 3   NONFORMULARY OR COMPOUNDED ITEM Beside commode #1  frequent falls, weakness 1 each 0   oxyCODONE  (ROXICODONE ) 5 MG immediate release tablet Take 1 tablet (5 mg total) by mouth every 6 (six) hours as needed. 9 tablet 0   OXYGEN  Inhale 3-4 L/min into the lungs continuous.      pantoprazole  (PROTONIX ) 40 MG tablet Take 1 tablet (40 mg total) by mouth 2 (two) times daily. 60 tablet 1   potassium chloride  SA (KLOR-CON  M) 20 MEQ tablet Take 2 tablets (40 mEq total) by mouth daily. 180 tablet 0   pregabalin  (LYRICA ) 100 MG capsule Take 1 capsule (100 mg total) by mouth at bedtime. 30 capsule 0   QUEtiapine  (SEROQUEL ) 200 MG tablet Take 1 tablet (200 mg total) by mouth at bedtime. 90 tablet 1   rosuvastatin  (CRESTOR ) 10 MG tablet Take 1 tablet (10 mg total) by mouth daily. 90 tablet 0   senna-docusate (SENOKOT-S) 8.6-50 MG tablet Take 1 tablet by mouth daily. 30 tablet 0    torsemide  (DEMADEX ) 20 MG tablet Take 2 tablets (40 mg total) by mouth every evening. 180 tablet 0   zolpidem  (AMBIEN  CR) 12.5 MG CR tablet Take 1 tablet (12.5 mg total) by mouth at bedtime as needed. for sleep 30 tablet 3   No current facility-administered medications for this visit.    Medication Side Effects: None  Allergies:  Allergies  Allergen Reactions   Hydrocodone  Other (See Comments)    Constipation and hallucinations   Norvasc  [Amlodipine  Besylate] Swelling  and Other (See Comments)    Marked swelling of the limbs   Tizanidine  Other (See Comments)    After the 3rd dose, the patient's mouth began to feel numb and she felt like she was a having a hot flash    Past Medical History:  Diagnosis Date   Allergic rhinitis    Depression    Diabetes mellitus type 2, uncontrolled    Emphysema of lung (HCC)    3L home O2   GERD (gastroesophageal reflux disease)    Hypertension    Hypothyroidism    Obesity, morbid, BMI 50 or higher (HCC)    Stroke (HCC) 2016   TIA    Urine incontinence     Family History  Problem Relation Age of Onset   Heart disease Father        MVP and Pics Valve   Hypertension Father    Depression Father        Institutionalized x's 2 years   Bipolar disorder Father    Hypertension Sister    Diabetes Sister    Hyperlipidemia Sister    Heart disease Sister 68       MI   Heart disease Brother    Hypertension Brother    Schizophrenia Paternal Aunt    Depression Paternal Aunt    Anxiety disorder Paternal Aunt    Heart disease Paternal Aunt    Schizophrenia Paternal Aunt    Heart disease Paternal Uncle    Heart disease Paternal Grandmother    Asthma Son    Asthma Son     Social History   Socioeconomic History   Marital status: Divorced    Spouse name: Not on file   Number of children: 4   Years of education: 14   Highest education level: 12th grade  Occupational History   Occupation: disabled- Biochemist, clinical Med  Tobacco Use    Smoking status: Former    Current packs/day: 0.00    Average packs/day: 1 pack/day for 40.0 years (40.0 ttl pk-yrs)    Types: Cigarettes    Start date: 07/21/1972    Quit date: 10/16/2011    Years since quitting: 12.4   Smokeless tobacco: Never  Vaping Use   Vaping status: Never Used  Substance and Sexual Activity   Alcohol  use: Not Currently    Comment: Occ-- Wine   Drug use: No   Sexual activity: Not Currently  Other Topics Concern   Not on file  Social History Narrative      Originally from [place]. Has also lived in [place], [place]. She also previously lived in [place] for 20 years. No history of Valley Fever. Moved to [place] in 1989. 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 No pets currently. No bird exposure. Questionable previous mold exposure in her daughter's home. 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       Lives in [place] alone in assisted living.  Has multiple chronic illness.      Are you right handed or left handed? Right handed   Are you currently employed ? no   What is your current occupation? retired   Do you live at home alone?no   Who lives with you? son   What type of home do you live in: 1  story or 2 story? Apartment first floor       Social Drivers of Health   Financial Resource Strain: Low Risk  (02/19/2024)   Overall Financial Resource Strain (CARDIA)    Difficulty of Paying Living Expenses: Not hard at all  Food Insecurity: No Food Insecurity (02/19/2024)   Hunger Vital Sign    Worried About Running Out of Food in the Last Year: Never true    Ran Out of Food in the Last Year: Never true  Transportation Needs: No Transportation Needs (02/19/2024)   PRAPARE - Administrator, Civil Service (Medical): No    Lack of  Transportation (Non-Medical): No  Physical Activity: Insufficiently Active (02/19/2024)   Exercise Vital Sign    Days of Exercise per Week: 2 days    Minutes of Exercise per Session: 20 min  Stress: No Stress Concern Present (02/19/2024)   [employer] of Occupational Health - Occupational Stress Questionnaire    Feeling of Stress : Only a little  Social Connections: Unknown (01/02/2024)   Social Connection and Isolation Panel    Frequency of Communication with Friends and Family: Three times a week    Frequency of Social Gatherings with Friends and Family: Three times a week    Attends Religious Services: Patient declined    Active Member of Clubs or Organizations: Yes    Attends Banker Meetings: More than 4 times per year    Marital Status: Divorced  Intimate Partner Violence: Not At Risk (12/31/2023)   Humiliation, Afraid, Rape, and Kick questionnaire    Fear of Current or Ex-Partner: No    Emotionally Abused: No    Physically Abused: No    Sexually Abused: No    Past Medical History, Surgical history, Social history, and Family history were reviewed and updated as appropriate.   Please see review of systems for further details on the patient's review from today.   Objective:   Physical Exam:  There were no vitals taken for this visit.  Physical Exam Pulmonary:     Effort: No respiratory distress.  Neurological:     Mental Status: She is alert and oriented to person, place, and time.  Psychiatric:        Attention and Perception: Attention and perception normal.        Mood and Affect: Mood normal.        Speech: Speech normal.        Behavior: Behavior normal. Behavior is cooperative.        Cognition and Memory: Cognition and memory normal.        Judgment: Judgment normal.     Comments: Insight intact     Lab Review:     Component Value Date/Time   NA 143 02/06/2024 0944   K 3.3 (L) 02/06/2024 0944   CL 97 02/06/2024 0944   CO2 28 02/06/2024  0944   GLUCOSE 116 (H) 02/06/2024 0944   GLUCOSE 65 (L) 01/08/2024 0328   BUN 12 02/06/2024 0944   CREATININE 1.70 (H) 02/06/2024 0944   CREATININE 1.45 (H) 07/09/2021 1446   CALCIUM  8.6 (L) 02/06/2024 0944   PROT 8.6 (H) 12/31/2023 1706   ALBUMIN  2.8 (L) 12/31/2023 1706   AST 40 12/31/2023 1706   ALT 18 12/31/2023 1706   ALKPHOS 107 12/31/2023 1706   BILITOT 1.0 12/31/2023 1706   GFRNONAA 42 (L) 01/08/2024 0328   GFRAA 31 (L) 01/01/2020 0951       Component Value Date/Time  WBC 5.4 02/06/2024 0944   WBC 8.4 01/08/2024 0328   RBC 4.00 02/06/2024 0944   RBC 3.34 (L) 01/08/2024 0328   HGB 12.1 02/06/2024 0944   HCT 39.0 02/06/2024 0944   PLT 176 02/06/2024 0944   MCV 98 (H) 02/06/2024 0944   MCH 30.3 02/06/2024 0944   MCH 29.9 01/08/2024 0328   MCHC 31.0 (L) 02/06/2024 0944   MCHC 30.8 01/08/2024 0328   RDW 14.5 02/06/2024 0944   LYMPHSABS 1.6 01/02/2024 0642   MONOABS 0.6 01/02/2024 0642   EOSABS 0.3 01/02/2024 0642   BASOSABS 0.0 01/02/2024 0642    No results found for: POCLITH, LITHIUM   No results found for: PHENYTOIN, PHENOBARB, VALPROATE, CBMZ   .res Assessment: Plan:    Recommendations/Plan   Greater than 50% of  20  min  video visit  with patient was spent on counseling and coordination of care.We reviewed her currently level of stability. Has experience fall. I reviewed medications today and I feel that the benefit outweighs risk when considering her chronic  health concerns. I feel that she would progressively decline if her goals for anxiety and sleep are not met.  She expresses understanding of this risk primarily for falls.    Today we agreed to: Continue Zolpidem  12.5 mg CR Continue Buspar  15 mg twice daily Continue Ativan  0.5-1.5 tablets  (1 mg 0.5-1.5 mg total) by mouth. Take 1-1.5 mg at bedtime and an additional 0.5 mg once a day needed for anxiety. Increased  Seroquel  to 200 mg daily at bedtime To contact this office if worsening  symptoms Will follow up in 6 months to reassess. She is requesting video visit next time due to ambulation and transportation issues. Provided emergency contact information Reviewed PDMP   Sue A. Fenix Ruppe, NP        Sue [name] was seen today for anxiety, follow-up, patient education, depression and medication refill.  Diagnoses and all orders for this visit:  Bipolar affective disorder, currently depressed, mild (HCC)  Generalized anxiety disorder -     busPIRone  (BUSPAR ) 15 MG tablet; Take 1 tablet (15 mg total) by mouth 2 (two) times daily. -     LORazepam  (ATIVAN ) 1 MG tablet; TAKE 1/2 TO 1 AND 1/2 TABLETS AT BEDTIME AND AN ADDITIONAL 1/2 TAB ONCE A DAY AS NEEDED FOR ANXIETY -     QUEtiapine  (SEROQUEL ) 200 MG tablet; Take 1 tablet (200 mg total) by mouth at bedtime.  Insomnia due to other mental disorder -     QUEtiapine  (SEROQUEL ) 200 MG tablet; Take 1 tablet (200 mg total) by mouth at bedtime. -     zolpidem  (AMBIEN  CR) 12.5 MG CR tablet; Take 1 tablet (12.5 mg total) by mouth at bedtime as needed. for sleep     Please see After Visit Summary for patient specific instructions.  Future Appointments  Date Time Provider Department Center  04/02/2024  8:00 AM Lucien Orren SAILOR, [place] [facility]  04/23/2024  8:10 AM [facility] ANNUAL WELLNESS VISIT 2 [facility]  04/24/2024 10:05 AM Daneen Damien BROCKS, NP [facility]  07/17/2024  9:00 AM Leigh Venetia CROME, MD [facility] None    No orders of the defined types were placed in this encounter.     -------------------------------

## 2024-03-25 ENCOUNTER — Ambulatory Visit: Payer: Self-pay

## 2024-03-25 NOTE — Telephone Encounter (Signed)
 Returned call. Pt refusing to go to the ER. Spoke with nurse and advised EMS was called and patient was evaluated and blood pressure went up to 101/58 and after some deep breathes O2 went up to 95%. Nurse advised that pt's daughter has been called to come stay with her

## 2024-03-25 NOTE — Telephone Encounter (Addendum)
 FYI Only or Action Required?: Action required by provider: refusing hospital.  Patient was last seen in primary care on 02/19/2024 by Sue [name], Sue SAUNDERS, DO.  Called Sue Triage reporting low blood pressure, Fatigue, Weakness, Aphasia, lethargic but arouseable per home health Sue, Altered Mental Status, and low oxygen  level.  Symptoms began today.  Interventions attempted: Prescription medications: ambien  and buspar  at same time at 6:30 am for lack of sleep.  Symptoms are: rapidly worsening.  Triage Disposition: Call EMS 911 Now  Patient/caregiver understands and will follow disposition?: No, refuses disposition    Alerted CAL to refusal.  Copied from CRM #[id number]. Topic: Clinical - Red Word Triage >> Mar 25, 2024 10:57 AM Sue [name] wrote: [facility] that prompted transfer to Sue Triage: Low blood pressure. Patient's blood pressure from today was 88/60 and Sue is stating that patient is lethargic. O2 is 95% on 4 liters. Sue [name] on the line. Reason for Disposition  Sounds like a life-threatening emergency to the triager  Answer Assessment - Initial Assessment Questions Sue [name] Sue consultant  1. BLOOD PRESSURE: What is your blood pressure? Did you take at least two measurements 5 minutes apart?     88/60 2. ONSET: When did you take your blood pressure?     Just took 10 min ago 3. HOW: How did you take your blood pressure? ([name].g., visiting Sue, automatic home BP monitor)     Visiting Sue 4. HISTORY: Do you have a history of low blood pressure? What is your blood pressure normally?     Hx of HTN and hypotension 5. MEDICINES: Are you taking any medicines for blood pressure? If Yes, ask: Have they been changed recently?     She was still in bed when got here, so not taken anything since 9:30 am, last time took meds 6:30 am for sleep 6. PULSE RATE: Do you know what your pulse rate is?      84 bpm  Took ambien  and buspar  together at 6:30  am lethargic but arouseable now Vital signs, 90% on 4L then deep breaths up to 95%, normally not like that, if sleepy usually can answer appropriately, today what she's saying doesn't quite make sense, mixing up her words, speech is slurred a little bit, seems weak overall today, even moving around in apartment, slower than normal 98.1 F, HR 84 bpm  Nobody here with her right now Refusing hospital Been taking this combo of meds for 10 years, typically goes to sleep, will sleep 8 hours and feels fine when wake up, could be just that just never seen her this lethargic before   Advised hospital for symptoms, pt refusing stating this is usually how she feels with med combo. Advised pt have someone come stay with her to ensure she is okay in meantime, sending message to PCP for them to check in.  Protocols used: Blood Pressure - Low-A-AH

## 2024-03-30 NOTE — Progress Notes (Deleted)
 Cardiology Office Note   Date:  03/30/2024  ID:  Sue [name], DOB [birth year], MRN [id number] PCP: Antonio Meth, Jamee SAUNDERS, DO  [facility] Providers Cardiologist:  Dub Huntsman, DO {  History of Present Illness Sue [name] is a 73 y.o. female who has a past medical history of chronic HFpEF, hypertension, falls, family history of CAD (father CABG), uncontrolled diabetes mellitus followed by endocrinology, CKD, TIA 2016, emphysema with chronic respiratory failure on supplemental oxygen , DVT, hypothyroidism, and obesity here for follow-up appointment.  Was seen by Dr. Huntsman 05/2021 with complaint of palpitation.  Coronary CT and heart monitor recommended but never completed.  Most recently admitted 12/2018 for rhabdo mitis, metabolic cephalopathy after fall (was down greater than 24 hours)elevated troponin.  She had developed epigastric/chest pain after admission, initial troponin was negative.  Repeat was mildly elevated but flat ([id number]).  TTE showed normal EF with inferior and inferior septal HK.  Patient was not a candidate for cardiac catheterization at that time due to ongoing confusion, AKI.  PET stress was recommended.  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  Recently contacted PCP in cardiology office with complaints of weight gain and peripheral edema.  Was seen 02/06/2024 for follow-up.  Overall was doing well at that time but had 2 more instances of chest tightness that only occurred at rest with some radiation to her neck.  5 to 10 minutes.  Ambulates with a walker, reports no exertional symptoms.  Reports some weight loss, to 58 pounds today, feels comfortable on her diuretic dose with no significant edema or shortness of breath.  Chronically is on supplemental oxygen .  Reports palpitations that occur randomly though not significant.  A PET/CT stress was ordered  Today, she***   ROS: ***  Studies Reviewed      Echocardiogram 01/04/2024 1. Left ventricular ejection fraction, by  estimation, is 55 to 60%. The  left ventricle has normal function. The left ventricle demonstrates  regional wall motion abnormalities (see scoring diagram/findings for  description). There is mild asymmetric left  ventricular hypertrophy of the basal-septal segment. Left ventricular  diastolic parameters are consistent with Grade I diastolic dysfunction  (impaired relaxation).   2. Right ventricular systolic function is mildly reduced. The right  ventricular size is mildly enlarged.   3. The mitral valve is normal in structure. Trivial mitral valve  regurgitation. No evidence of mitral stenosis.   4. The aortic valve is tricuspid. There is mild calcification of the  aortic valve. Aortic valve regurgitation is not visualized. No aortic  stenosis is present.   5. The inferior vena cava is normal in size with greater than 50%  respiratory variability, suggesting right atrial pressure of 3 mmHg.    Risk Assessment/Calculations {Does this patient have ATRIAL FIBRILLATION?:[phone]} No BP recorded.  {Refresh Note OR Click here to enter BP  :1}***       Physical Exam VS:  There were no vitals taken for this visit.       Wt Readings from Last 3 Encounters:  02/06/24 258 lb 3.2 oz (117.1 kg)  12/31/23 260 lb (117.9 kg)  12/29/23 270 lb (122.5 kg)    GEN: Well nourished, well developed in no acute distress NECK: No JVD; No carotid bruits CARDIAC: ***RRR, no murmurs, rubs, gallops RESPIRATORY:  Clear to auscultation without rales, wheezing or rhonchi  ABDOMEN: Soft, non-tender, non-distended EXTREMITIES:  No edema; No deformity   ASSESSMENT AND PLAN Chest pain Chronic HFpEF Hypertension Palpitations  Uncontrolled diabetes CKD  Emphysema BMI 45   {Are you ordering a CV Procedure (e.g. stress test, cath, DCCV, TEE, etc)?   Press F2        :[id number]}  Dispo: ***  Signed, Orren LOISE Fabry, PA-C

## 2024-04-02 ENCOUNTER — Ambulatory Visit: Admitting: Physician Assistant

## 2024-04-02 DIAGNOSIS — I10 Essential (primary) hypertension: Secondary | ICD-10-CM

## 2024-04-02 DIAGNOSIS — I5032 Chronic diastolic (congestive) heart failure: Secondary | ICD-10-CM

## 2024-04-02 DIAGNOSIS — Z79899 Other long term (current) drug therapy: Secondary | ICD-10-CM

## 2024-04-02 DIAGNOSIS — R079 Chest pain, unspecified: Secondary | ICD-10-CM

## 2024-04-02 DIAGNOSIS — R002 Palpitations: Secondary | ICD-10-CM

## 2024-04-02 DIAGNOSIS — N1832 Chronic kidney disease, stage 3b: Secondary | ICD-10-CM

## 2024-04-05 ENCOUNTER — Other Ambulatory Visit: Payer: Self-pay | Admitting: Family Medicine

## 2024-04-05 MED ORDER — TOUJEO SOLOSTAR 300 UNIT/ML ~~LOC~~ SOPN: 40.0000 [IU] | PEN_INJECTOR | Freq: Every day | SUBCUTANEOUS | 1 refills | Status: DC

## 2024-04-05 NOTE — Telephone Encounter (Signed)
 Copied from CRM [id number]. Topic: Clinical - Medication Refill >> Apr 05, 2024  1:08 PM Jayma L wrote: Medication: insulin  glargine, 1 Unit Dial, (TOUJEO  SOLOSTAR) 300 UNIT/ML Solostar Pen  Has the patient contacted their pharmacy? Yes (Agent: If no, request that the patient contact the pharmacy for the refill. If patient does not wish to contact the pharmacy document the reason why and proceed with request.) (Agent: If yes, when and what did the pharmacy advise?)  This is the patient's preferred pharmacy:  [facility] [address] [place] - [address] [place] [address] Phone: [phone] Fax: [phone]  Is this the correct pharmacy for this prescription? Yes If no, delete pharmacy and type the correct one.   Has the prescription been filled recently? No  Is the patient out of the medication? yes  Has the patient been seen for an appointment in the last year OR does the patient have an upcoming appointment? Yes  Can we respond through MyChart? No  Agent: Please be advised that Rx refills may take up to 3 business days. We ask that you follow-up with your pharmacy.

## 2024-04-23 ENCOUNTER — Ambulatory Visit

## 2024-04-23 VITALS — Ht 63.0 in | Wt 253.0 lb

## 2024-04-23 DIAGNOSIS — Z0000 Encounter for general adult medical examination without abnormal findings: Secondary | ICD-10-CM | POA: Diagnosis not present

## 2024-04-23 DIAGNOSIS — Z122 Encounter for screening for malignant neoplasm of respiratory organs: Secondary | ICD-10-CM

## 2024-04-23 DIAGNOSIS — Z1231 Encounter for screening mammogram for malignant neoplasm of breast: Secondary | ICD-10-CM

## 2024-04-23 DIAGNOSIS — Z13820 Encounter for screening for osteoporosis: Secondary | ICD-10-CM

## 2024-04-23 NOTE — Progress Notes (Addendum)
 Subjective:   Sue [name] is a 73 y.o. who presents for a Medicare Wellness preventive visit.  As a reminder, Annual Wellness Visits don't include a physical exam, and some assessments may be limited, especially if this visit is performed virtually. We may recommend an in-person follow-up visit with your provider if needed.  Visit Complete: Virtual I connected with  Sue [name] on 04/23/24 by a audio enabled telemedicine application and verified that I am speaking with the correct person using two identifiers.  Patient Location: Home  Provider Location: Home Office  I discussed the limitations of evaluation and management by telemedicine. The patient expressed understanding and agreed to proceed.  Vital Signs: Because this visit was a virtual/telehealth visit, some criteria may be missing or patient reported. Any vitals not documented were not able to be obtained and vitals that have been documented are patient reported.    Persons Participating in Visit: Patient.  AWV Questionnaire: No: Patient Medicare AWV questionnaire was not completed prior to this visit.  Cardiac Risk Factors include: advanced age (>49men, >22 women)     Objective:    Today's Vitals   04/23/24 0816  Weight: 253 lb (114.8 kg)  Height: 5' 3 (1.6 m)   Body mass index is 44.82 kg/m.     04/23/2024    8:31 AM 12/31/2023   10:18 PM 12/31/2023    3:52 PM 12/29/2023    7:02 PM 05/05/2023    8:26 AM 02/03/2023    8:57 AM 01/27/2023    9:02 AM  Advanced Directives  Does Patient Have a Medical Advance Directive? No No No No No No No  Would patient like information on creating a medical advance directive? No - Patient declined Yes (Inpatient - patient requests chaplain consult to create a medical advance directive) No - Patient declined No - Patient declined  No - Patient declined No - Patient declined    Current Medications (verified) Outpatient Encounter Medications as of 04/23/2024  Medication Sig    Accu-Chek Softclix Lancets lancets USE 1  TO CHECK GLUCOSE 4 TIMES DAILY   albuterol  (VENTOLIN  HFA) 108 (90 Base) MCG/ACT inhaler Inhale 1-2 puffs into the lungs every 6 (six) hours as needed for wheezing or shortness of breath. (Patient not taking: Reported on 02/06/2024)   aspirin  EC 81 MG tablet Take 1 tablet (81 mg total) by mouth daily. Swallow whole.   Blood Glucose Monitoring Suppl (ACCU-CHEK GUIDE) w/Device KIT USE   TO CHECK GLUCOSE UP TO 4 TIMES DAILY AS DIRECTED   busPIRone  (BUSPAR ) 15 MG tablet Take 1 tablet (15 mg total) by mouth 2 (two) times daily.   doxycycline  (VIBRA -TABS) 100 MG tablet Take 1 tablet (100 mg total) by mouth 2 (two) times daily. (Patient not taking: Reported on 04/23/2024)   Dulaglutide  (TRULICITY ) 3 MG/0.5ML SOAJ Inject 3 mg into the skin once a week.   fluconazole  (DIFLUCAN ) 150 MG tablet Take 1 tablet (150 mg total) by mouth daily. May repeat in 3 days if needed. (Patient not taking: Reported on 04/23/2024)   glucose blood (ACCU-CHEK GUIDE) test strip USE 1 STRIP TO CHECK GLUCOSE 4 TIMES DAILY   insulin  aspart (NOVOLOG  FLEXPEN) 100 UNIT/ML FlexPen Novolog  10 units with each meal PLUS the scale if needed -Novolog  correctional insulin : ADD extra units on insulin  to your meal-time Novolog  dose if your blood sugars are higher than 160. Use the scale below to help guide you:  Blood sugar before meal Number of units to inject Less  than 160 0 unit 161 -  190 1 units 191 -  220 2 units 221 -  250 3 units 251 -  280 4 units 281 -  310 5 units 311 -  340 6 units 341 -  370 7 units 371 -  400 8 units Max daily 45 units   insulin  glargine, 1 Unit Dial, (TOUJEO  SOLOSTAR) 300 UNIT/ML Solostar Pen Inject 40 Units into the skin at bedtime.   Insulin  Pen Needle (BD PEN NEEDLE NANO U/F) 32G X 4 MM MISC Use to inject insulin    levothyroxine  (SYNTHROID ) 100 MCG tablet Take 1 tablet (100 mcg total) by mouth daily before breakfast.   lidocaine  (LIDODERM ) 5 % Place 1 patch onto the skin daily.  Remove & Discard patch within 12 hours or as directed by MD   LORazepam  (ATIVAN ) 1 MG tablet TAKE 1/2 TO 1 AND 1/2 TABLETS AT BEDTIME AND AN ADDITIONAL 1/2 TAB ONCE A DAY AS NEEDED FOR ANXIETY   magic mouthwash w/lidocaine  SOLN Take 5 mLs by mouth 4 (four) times daily as needed for mouth pain. Swish and Spit   metoprolol  succinate (TOPROL -XL) 100 MG 24 hr tablet Take 1 tablet (100 mg total) by mouth daily. Take with or immediately following a meal.   NONFORMULARY OR COMPOUNDED ITEM Beside commode #1  frequent falls, weakness   oxyCODONE  (ROXICODONE ) 5 MG immediate release tablet Take 1 tablet (5 mg total) by mouth every 6 (six) hours as needed. (Patient not taking: Reported on 04/23/2024)   OXYGEN  Inhale 3-4 L/min into the lungs continuous.    pantoprazole  (PROTONIX ) 40 MG tablet Take 1 tablet (40 mg total) by mouth 2 (two) times daily.   potassium chloride  SA (KLOR-CON  M) 20 MEQ tablet Take 2 tablets (40 mEq total) by mouth daily.   pregabalin  (LYRICA ) 100 MG capsule Take 1 capsule (100 mg total) by mouth at bedtime.   QUEtiapine  (SEROQUEL ) 200 MG tablet Take 1 tablet (200 mg total) by mouth at bedtime.   rosuvastatin  (CRESTOR ) 10 MG tablet Take 1 tablet (10 mg total) by mouth daily.   senna-docusate (SENOKOT-S) 8.6-50 MG tablet Take 1 tablet by mouth daily.   torsemide  (DEMADEX ) 20 MG tablet Take 2 tablets (40 mg total) by mouth every evening.   zolpidem  (AMBIEN  CR) 12.5 MG CR tablet Take 1 tablet (12.5 mg total) by mouth at bedtime as needed. for sleep   No facility-administered encounter medications on file as of 04/23/2024.    Allergies (verified) Hydrocodone , Norvasc  [amlodipine  besylate], and Tizanidine    History: Past Medical History:  Diagnosis Date   Allergic rhinitis    Depression    Diabetes mellitus type 2, uncontrolled    Emphysema of lung (HCC)    3L home O2   GERD (gastroesophageal reflux disease)    Hypertension    Hypothyroidism    Obesity, morbid, BMI 50 or higher  (HCC)    Stroke (HCC) 2016   TIA    Urine incontinence    Past Surgical History:  Procedure Laterality Date   ABDOMINAL HYSTERECTOMY     CESAREAN SECTION     COLONOSCOPY N/A 08/19/2019   Procedure: COLONOSCOPY;  Surgeon: Rosalie Kitchens, MD;  Location: [facility];  Service: Endoscopy;  Laterality: N/A;   COLONOSCOPY WITH PROPOFOL  N/A 08/05/2019   Procedure: COLONOSCOPY WITH PROPOFOL ;  Surgeon: Dianna Specking, MD;  Location: [facility];  Service: Gastroenterology;  Laterality: N/A;   COLONOSCOPY WITH PROPOFOL  N/A 11/19/2019   Procedure: COLONOSCOPY WITH PROPOFOL ;  Surgeon: Buccini,  Lamar, MD;  Location: [facility];  Service: Endoscopy;  Laterality: N/A;  Unprepped   COLONOSCOPY WITH PROPOFOL  N/A 11/22/2019   Procedure: COLONOSCOPY WITH PROPOFOL ;  Surgeon: Donnald Lamar, MD;  Location: [facility];  Service: Endoscopy;  Laterality: N/A;   COLONOSCOPY WITH PROPOFOL  N/A 11/23/2019   Procedure: COLONOSCOPY WITH PROPOFOL ;  Surgeon: Saintclair Jasper, MD;  Location: [facility];  Service: Gastroenterology;  Laterality: N/A;   COLONOSCOPY WITH PROPOFOL  N/A 11/29/2019   Procedure: COLONOSCOPY WITH PROPOFOL ;  Surgeon: Dianna Specking, MD;  [facility]: [facility];  Service: Endoscopy;  Laterality: N/A;   ENTEROSCOPY N/A 11/24/2019   Procedure: ENTEROSCOPY;  Surgeon: Saintclair Jasper, MD;  Location: [facility];  Service: Gastroenterology;  Laterality: N/A;   ENTEROSCOPY N/A 11/27/2019   Procedure: ENTEROSCOPY;  Surgeon: Dianna Specking, MD;  [facility];  Service: Endoscopy;  Laterality: N/A;   ESOPHAGOGASTRODUODENOSCOPY N/A 11/27/2019   Procedure: ESOPHAGOGASTRODUODENOSCOPY (EGD);  Surgeon: Dianna Specking, MD;  Location: [facility];  Service: Endoscopy;  Laterality: N/A;   ESOPHAGOGASTRODUODENOSCOPY (EGD) WITH PROPOFOL  N/A 11/24/2019   Procedure: ESOPHAGOGASTRODUODENOSCOPY (EGD) WITH PROPOFOL ;  Surgeon: Saintclair Jasper, MD;  [facility];  Service: Gastroenterology;  Laterality:  N/A;  PUSH enteroscopy   GIVENS CAPSULE STUDY N/A 11/19/2019   Procedure: GIVENS CAPSULE STUDY;  Surgeon: Donnald Lamar, MD;  [facility];  Service: Endoscopy;  Laterality: N/A;  To be performed immediately following colonoscopy   GIVENS CAPSULE STUDY N/A 11/24/2019   Procedure: GIVENS CAPSULE STUDY;  Surgeon: Saintclair Jasper, MD;  [facility];  Service: Gastroenterology;  Laterality: N/A;   GIVENS CAPSULE STUDY N/A 11/28/2019   Procedure: GIVENS CAPSULE STUDY;  Surgeon: Dianna Specking, MD;  [facility];  Service: Endoscopy;  Laterality: N/A;   HEMOSTASIS CLIP PLACEMENT  11/19/2019   Procedure: HEMOSTASIS CLIP PLACEMENT;  Surgeon: Donnald Lamar, MD;  [facility];  Service: Endoscopy;;   HEMOSTASIS CLIP PLACEMENT  11/22/2019   Procedure: HEMOSTASIS CLIP PLACEMENT;  Surgeon: Donnald Lamar, MD;  Location: [facility];  Service: Endoscopy;;   HOT HEMOSTASIS N/A 11/24/2019   Procedure: HOT HEMOSTASIS (ARGON PLASMA COAGULATION/BICAP);  Surgeon: Saintclair Jasper, MD;  Location: [facility];  Service: Gastroenterology;  Laterality: N/A;   HOT HEMOSTASIS N/A 11/27/2019   Procedure: HOT HEMOSTASIS (ARGON PLASMA COAGULATION/BICAP);  Surgeon: Dianna Specking, MD;  Location: [facility];  Service: Endoscopy;  Laterality: N/A;   SUBMUCOSAL TATTOO INJECTION  11/19/2019   Procedure: SUBMUCOSAL TATTOO INJECTION;  Surgeon: Donnald Lamar, MD;  [facility];  Service: Endoscopy;;   Family History  Problem Relation Age of Onset   Heart disease Father        MVP and Pics Valve   Hypertension Father    Depression Father        Institutionalized x's 2 years   Bipolar disorder Father    Hypertension Sister    Diabetes Sister    Hyperlipidemia Sister    Heart disease Sister 5       MI   Heart disease Brother    Hypertension Brother    Schizophrenia Paternal Aunt    Depression Paternal Aunt    Anxiety disorder Paternal Aunt    Heart disease Paternal Aunt     Schizophrenia Paternal Aunt    Heart disease Paternal Uncle    Heart disease Paternal Grandmother    Asthma Son    Asthma Son    Social History   Socioeconomic History   Marital status: Divorced    Spouse name: Not on file  Number of children: 4   Years of education: 14   Highest education level: 12th grade  Occupational History   Occupation: disabled- [employer]  Tobacco Use   Smoking status: Former    Current packs/day: 0.00    Average packs/day: 1 pack/day for 40.0 years (40.0 ttl pk-yrs)    Types: Cigarettes    Start date: 07/21/1972    Quit date: 10/16/2011    Years since quitting: 12.5   Smokeless tobacco: Never  Vaping Use   Vaping status: Never Used  Substance and Sexual Activity   Alcohol  use: Not Currently    Comment: Occ-- Wine   Drug use: No   Sexual activity: Not Currently  Other Topics Concern   Not on file  Social History Narrative      Originally from [place]. Has also lived in [place], [place]. She also previously lived in [place] for 20 years. No history of Valley Fever. Moved to [place] in 1989. 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 No pets currently. No bird exposure. Questionable previous mold exposure in her daughter's home. 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       Lives in [place] alone in assisted living.  Has multiple chronic illness.      Are you right handed or left handed? Right handed   Are you currently employed ? no   What is your current occupation? retired   Do you live at home alone?no   Who lives with you? son   What type of home do you live in: 1 story or 2 story? Apartment first floor       Social Drivers of Health   Financial Resource Strain: Low Risk  (04/23/2024)   Overall Financial Resource  Strain (CARDIA)    Difficulty of Paying Living Expenses: Not hard at all  Food Insecurity: No Food Insecurity (04/23/2024)   Hunger Vital Sign    Worried About Running Out of Food in the Last Year: Never true    Ran Out of Food in the Last Year: Never true  Transportation Needs: No Transportation Needs (04/23/2024)   PRAPARE - Administrator, Civil Service (Medical): No    Lack of Transportation (Non-Medical): No  Physical Activity: Insufficiently Active (04/23/2024)   Exercise Vital Sign    Days of Exercise per Week: 1 day    Minutes of Exercise per Session: 10 min  Stress: No Stress Concern Present (04/23/2024)   [employer] of Occupational Health - Occupational Stress Questionnaire    Feeling of Stress: Not at all  Social Connections: Socially Isolated (04/23/2024)   Social Connection and Isolation Panel    Frequency of Communication with Friends and Family: More than three times a week    Frequency of Social Gatherings with Friends and Family: More than three times a week    Attends Religious Services: Never    Database administrator or Organizations: No    Attends Engineer, structural: Never    Marital Status: Divorced    Tobacco Counseling Counseling given: Not Answered    Clinical Intake:  Pre-visit preparation completed: Yes  Pain : No/denies pain     BMI - recorded: 44.82 Nutritional Status: BMI > 30  Obese Nutritional Risks: None Diabetes:  Yes CBG done?: No Did pt. bring in CBG monitor from home?: No  Lab Results  Component Value Date   HGBA1C 10.3 (H) 11/07/2023   HGBA1C 13.5 (A) 08/21/2023   HGBA1C 9.9 (H) 02/03/2023     How often do you need to have someone help you when you read instructions, pamphlets, or other written materials from your doctor or pharmacy?: 4 - Often (Daughter assist)  Interpreter Needed?: No  Information entered by :: Rojelio Blush LPN   Activities of Daily Living      04/23/2024    8:29 AM  12/31/2023   10:22 PM  In your present state of health, do you have any difficulty performing the following activities:  Hearing? 0   Vision? 0   Difficulty concentrating or making decisions? 0   Walking or climbing stairs? 1   Comment Uses a Walker   Dressing or bathing? 0   Doing errands, shopping? 0 0  Preparing Food and eating ? N   Using the Toilet? N   In the past six months, have you accidently leaked urine? N   Do you have problems with loss of bowel control? N   Managing your Medications? N   Managing your Finances? N   Housekeeping or managing your Housekeeping? N     Patient Care Team: Antonio Meth, Jamee SAUNDERS, DO as PCP - General (Family Medicine) Tobb, Kardie, DO as PCP - Cardiology (Cardiology) Charlott Elsie LELON MADISON, MD as Referring Physician (Internal Medicine) Charlott Elsie LELON MADISON, MD as Referring Physician (Internal Medicine) Antonio Meth, Jamee SAUNDERS, DO (Family Medicine) Jeffrie Oneil BROCKS, MD as Consulting Physician (Cardiology) Leigh Venetia CROME, MD as Consulting Physician (Neurology) Carla Milling, RPH-CPP (Pharmacist)   I have updated your Care Teams any recent Medical Services you may have received from other providers in the past year.     Assessment:   This is a routine wellness examination for Audi.  Hearing/Vision screen Hearing Screening - Comments:: Denies hearing difficulties   Vision Screening - Comments:: Wears reading glasses - up to date with routine eye exams with  [facility]   Goals Addressed               This Visit's Progress     Increase physical activity (pt-stated)        Get more active.       Depression Screen      04/23/2024    8:28 AM 07/04/2023    1:07 PM 05/01/2023    2:42 PM 03/21/2023    2:02 PM 03/06/2023   12:08 PM 02/28/2023    4:45 PM 01/30/2023   10:14 AM  PHQ 2/9 Scores  PHQ - 2 Score 0 2 2 2 2 2 2   PHQ- 9 Score  7 7 7 7 7      Fall Risk      04/23/2024    8:29 AM 05/05/2023    8:26 AM 02/03/2023     8:56 AM 01/27/2023    9:05 AM 06/09/2022    1:14 PM  Fall Risk   Falls in the past year? 1 0 0 0 0  Number falls in past yr: 0 0 0 0 0  Injury with Fall? 1 0 0 0 0  Comment Followed by medical attention      Risk for fall due to : No Fall Risks  Impaired balance/gait Impaired balance/gait   Follow up Falls evaluation completed Falls evaluation completed Falls evaluation completed;Education provided Falls evaluation completed  MEDICARE RISK AT HOME:   Medicare Risk at Home Any stairs in or around the home?: No If so, are there any without handrails?: No Home free of loose throw rugs in walkways, pet beds, electrical cords, etc?: Yes Adequate lighting in your home to reduce risk of falls?: Yes Life alert?: Yes Use of a cane, walker or w/c?: Yes Grab bars in the bathroom?: Yes Shower chair or bench in shower?: Yes Elevated toilet seat or a handicapped toilet?: Yes  TIMED UP AND GO:  Was the test performed?  No  Cognitive Function: 6CIT completed        04/23/2024    8:31 AM 01/27/2023    9:14 AM  6CIT Screen  What Year? 0 points   What month? 0 points   What time? 0 points 0 points  Count back from 20 0 points 0 points  Months in reverse 0 points 4 points  Repeat phrase 0 points 2 points  Total Score 0 points     Immunizations Immunization History  Administered Date(s) Administered   Fluad Quad(high Dose 65+) 05/28/2019, 05/28/2020, 07/16/2021   Fluad Trivalent(High Dose 65+) 11/07/2023   Influenza, High Dose Seasonal PF 08/18/2018   Influenza,inj,Quad PF,6+ Mos 08/21/2014, 10/27/2015, 08/23/2016, 05/11/2017   Influenza-Unspecified 09/19/2011, 06/12/2013   PFIZER(Purple Top)SARS-COV-2 Vaccination 01/11/2020, 03/17/2020   Pneumococcal Conjugate-13 04/06/2017   Pneumococcal Polysaccharide-23 02/17/2012, 09/12/2012, 05/28/2020   Tdap 09/12/2009, 02/17/2012    Screening Tests Health Maintenance  Topic Date Due   Zoster Vaccines- Shingrix (1 of 2) Never done    Hepatitis B Vaccines (1 of 3 - Risk 3-dose series) Never done   MAMMOGRAM  01/01/2015   DEXA SCAN  Never done   Lung Cancer Screening  11/12/2020   DTaP/Tdap/Td (3 - Td or Tdap) 02/16/2022   FOOT EXAM  04/30/2023   OPHTHALMOLOGY EXAM  10/14/2023   INFLUENZA VACCINE  04/12/2024   HEMOGLOBIN A1C  05/06/2024   Diabetic kidney evaluation - Urine ACR  11/06/2024   Diabetic kidney evaluation - eGFR measurement  02/05/2025   Medicare Annual Wellness (AWV)  04/23/2025   Colonoscopy  11/28/2029   Pneumococcal Vaccine: 50+ Years  Completed   Hepatitis C Screening  Completed   HPV VACCINES  Aged Out   Meningococcal B Vaccine  Aged Out   COVID-19 Vaccine  Discontinued    Health Maintenance  Health Maintenance Due  Topic Date Due   Zoster Vaccines- Shingrix (1 of 2) Never done   Hepatitis B Vaccines (1 of 3 - Risk 3-dose series) Never done   MAMMOGRAM  01/01/2015   DEXA SCAN  Never done   Lung Cancer Screening  11/12/2020   DTaP/Tdap/Td (3 - Td or Tdap) 02/16/2022   FOOT EXAM  04/30/2023   OPHTHALMOLOGY EXAM  10/14/2023   INFLUENZA VACCINE  04/12/2024   Health Maintenance Items Addressed: Mammogram ordered, DEXA ordered, Lung Cancer Screening  Additional Screening:  Vision Screening: Recommended annual ophthalmology exams for early detection of glaucoma and other disorders of the eye. Would you like a referral to an eye doctor? No    Dental Screening: Recommended annual dental exams for proper oral hygiene  Community Resource Referral / Chronic Care Management: CRR required this visit?  No   CCM required this visit?  No   Plan:    I have personally reviewed and noted the following in the patient's chart:   Medical and social history Use of alcohol , tobacco or illicit drugs  Current medications and supplements including opioid  prescriptions. Patient is not currently taking opioid prescriptions. Functional ability and status Nutritional status Physical activity Advanced  directives List of other physicians Hospitalizations, surgeries, and ER visits in previous 12 months Vitals Screenings to include cognitive, depression, and falls Referrals and appointments  In addition, I have reviewed and discussed with patient certain preventive protocols, quality metrics, and best practice recommendations. A written personalized care plan for preventive services as well as general preventive health recommendations were provided to patient.   Rojelio LELON Blush, LPN   [id number]   After Visit Summary: (MyChart) Due to this being a telephonic visit, the after visit summary with patients personalized plan was offered to patient via MyChart   Notes: Nothing significant to report at this time.

## 2024-04-23 NOTE — Addendum Note (Signed)
 Addended by: TANDA ROJELIO ORN on: 04/23/2024 09:01 AM   Modules accepted: Orders, Level of Service

## 2024-04-23 NOTE — Patient Instructions (Addendum)
 Sue [name] , Thank you for taking time out of your busy schedule to complete your Annual Wellness Visit with me. I enjoyed our conversation and look forward to speaking with you again next year. I, as well as your care team,  appreciate your ongoing commitment to your health goals. Please review the following plan we discussed and let me know if I can assist you in the future. Your Game plan/ To Do List    Referrals: If you haven't heard from the office you've been referred to, please reach out to them at the phone provided.   Follow up Visits: We will see or speak with you next year for your Next Medicare AWV with our clinical staff 04/29/25 @ 8:10a Have you seen your provider in the last 6 months (3 months if uncontrolled diabetes)?   Clinician Recommendations:  Aim for 30 minutes of exercise or brisk walking, 6-8 glasses of water, and 5 servings of fruits and vegetables each day.       This is a list of the screenings recommended for you:  Health Maintenance  Topic Date Due   Zoster (Shingles) Vaccine (1 of 2) Never done   Hepatitis B Vaccine (1 of 3 - Risk 3-dose series) Never done   Mammogram  01/01/2015   DEXA scan (bone density measurement)  Never done   Screening for Lung Cancer  11/12/2020   DTaP/Tdap/Td vaccine (3 - Td or Tdap) 02/16/2022   Complete foot exam   04/30/2023   Eye exam for diabetics  10/14/2023   Flu Shot  04/12/2024   Hemoglobin A1C  05/06/2024   Yearly kidney health urinalysis for diabetes  11/06/2024   Yearly kidney function blood test for diabetes  02/05/2025   Medicare Annual Wellness Visit  04/23/2025   Colon Cancer Screening  11/28/2029   Pneumococcal Vaccine for age over 34  Completed   Hepatitis C Screening  Completed   HPV Vaccine  Aged Out   Meningitis B Vaccine  Aged Out   COVID-19 Vaccine  Discontinued    Advanced directives: (Declined) Advance directive discussed with you today. Even though you declined this today, please call our office  should you change your mind, and we can give you the proper paperwork for you to fill out. Advance Care Planning is important because it:  [x]  Makes sure you receive the medical care that is consistent with your values, goals, and preferences  [x]  It provides guidance to your family and loved ones and reduces their decisional burden about whether or not they are making the right decisions based on your wishes.  Follow the link provided in your after visit summary or read over the paperwork we have mailed to you to help you started getting your Advance Directives in place. If you need assistance in completing these, please reach out to us  so that we can help you!  See attachments for Preventive Care and Fall Prevention Tips.

## 2024-04-23 NOTE — Progress Notes (Deleted)
 Cardiology Office Note:  .   Date:  04/23/2024  ID:  LEELAH HANNA, DOB 1951-09-05, MRN [id number] PCP: Antonio Meth, Jamee SAUNDERS, DO  [facility] Providers Cardiologist:  Dub Huntsman, DO {  History of Present Illness: .   JENALYN GIRDNER is a 73 y.o. female with history of  chronic HFpEF, hypertension, falls, family history of CAD (father CABG), uncontrolled diabetes followed by endocrinology, CKD, TIA 2016, emphysema with chronic respiratory failure on supplemental oxygen , DVT, hypothyroidism, obesity.      Chest pain Seen 05/2021 with complaints of DOE and palpitations.  Coronary CTA ordered but never completed. Admitted 12/2023 for rhabdomyolysis, metabolic encephalopathy after a fall (was stuck and down for >24 hours), elevated troponins.  She had developed epigastric/chest pain after admission, initial troponin negative. Repeat was mildly elevated (236-235), but flat. TTE showed normal EF with inf and inf-sept HK. Pt was not a candidate for cardiac catheterization at that time due to ongoing confusion, AKI.  Seen outpatient 01/2024 PET stress test ordered and not completed.    Social history      Patient with no significant cardiac history.  She has had intermittent complaints of chest pain over the years but has never completed any ischemic testing ordered for this.  She has recent admission for/2025 for rhabdomyolysis and had elevated troponins in the 200s with inferior wall motion abnormalities.  Outpatient PET/CT was ordered when we saw her posthospitalization but she never completed this.  Chest pain Overall atypical symptoms although does have family history of a father with CABG and inferior wall motion abnormalities although with preserved EF.  PET/CT was ordered which was not completed. Continue with aspirin , Toprol -XL 100 mg, rosuvastatin  10 mg.  LDL is well-controlled, 33 in February 2025.   Chronic HFpEF Multifactorial due to obesity, COPD.  Overall with stable  symptoms does not report any significant peripheral edema/orthopnea. Continue with torsemide  60 mg daily, potassium supplementation.   Hypertension   Palpitations No arrhythmias noted on telemetry inpatient.  Discussed heart monitor, already on beta-blocker therapy, not having significant symptoms so we will defer for the time being as this is likely not going to change management. Continue with Toprol -XL 100 mg.   Uncontrolled diabetes Followed by endocrinology, A1c 10.3%  months ago. Will defer to endocrinologist if candidate for SGLT2 inhibitor/GLP-1.   CKD  Recent admission for rhabdo 12/2023, renal function appears to be improving.  BMP as above.   Emphysema Chronic respiratory failure on supplemental oxygen    BMI 45  Highly encourage lifestyle modifications, increasing step count and avoiding sodas and fast food.  Weight today is 258.   ROS: Denies: Chest pain, shortness of breath, orthopnea, peripheral edema, palpitations, decreased exercise intolerance, fatigue, lightheadedness.   Studies Reviewed: .         Risk Assessment/Calculations:   {Does this patient have ATRIAL FIBRILLATION?:[phone]} No BP recorded.  {Refresh Note OR Click here to enter BP  :1}***       Physical Exam:   VS:  There were no vitals taken for this visit.   Wt Readings from Last 3 Encounters:  04/23/24 253 lb (114.8 kg)  02/06/24 258 lb 3.2 oz (117.1 kg)  12/31/23 260 lb (117.9 kg)    GEN: Well nourished, well developed in no acute distress NECK: No JVD; No carotid bruits CARDIAC: ***RRR, no murmurs, rubs, gallops RESPIRATORY:  Clear to auscultation without rales, wheezing or rhonchi  ABDOMEN: Soft, non-tender, non-distended EXTREMITIES:  No edema; No deformity  ASSESSMENT AND PLAN: .         {Are you ordering a CV Procedure (e.g. stress test, cath, DCCV, TEE, etc)?   Press F2        :[id number]}  Dispo: ***  Signed, Thom LITTIE Sluder, PA-C

## 2024-04-24 ENCOUNTER — Ambulatory Visit: Attending: Nurse Practitioner | Admitting: Nurse Practitioner

## 2024-04-29 ENCOUNTER — Ambulatory Visit: Payer: Self-pay

## 2024-04-29 NOTE — Telephone Encounter (Signed)
 Appointment made for 04/30/2024 at 11:20 AM with Leita Eliza Elbe   FYI Only or Action Required?: FYI only for provider.  Patient was last seen in primary care on 02/19/2024 by Antonio Meth, Jamee SAUNDERS, DO.  Called Nurse Triage reporting Breast Problem.  Symptoms began yesterday.  Interventions attempted: Other: warm washcloth.  Symptoms are: gradually worsening.  Triage Disposition: See PCP Within 2 Weeks  Patient/caregiver understands and will follow disposition?: Yes                 Copied from CRM #[id number]. Topic: Clinical - Red Word Triage >> Apr 29, 2024 11:26 AM Rea BROCKS wrote: Red Word that prompted transfer to Nurse Triage: Nodule on side of left breast and there's puss/blood coming out of it, painful, no swelling. Just started last night. Reason for Disposition  [1] Breast pain AND [2] cause is not known  Answer Assessment - Initial Assessment Questions Sore last night and patient found a nodule or something that she thinks might have been a cyst on the outside of her left breast about 6 inches from her nipple.  She put a warm cloth on it and blood/pus started draining from it She denies any other changes to her breast She states she may also be overdue for a mammogram Patient also mentioned that on the left side of her vagina she feels like there may be a cyst there as well Appointment is made for tomorrow and she is advised to call us  if anything changes or if she has any questions Patient is advised that if anything worsens to go to the Emergency Room. Patient verbalized understanding.    1. SYMPTOM: What's the main symptom you're concerned about?  (e.g., lump, nipple discharge, pain, rash)     Quarter sized blister on the left breast 2. LOCATION: Where is the blister located?     Left breast 3. ONSET: When did blister/drainage  start?     Noticed it felt sore last night 4. PRIOR HISTORY: Do you have any history of prior problems with your  breasts? (e.g., breast cancer, breast implant, fibrocystic breast disease)     No 5. CAUSE: What do you think is causing this symptom?     unsure 6. OTHER SYMPTOMS: Do you have any other symptoms? (e.g., breast pain, fever, nipple discharge, redness or rash)     Patient states a possible other area on the outside of her vagina---  Protocols used: Breast Symptoms-A-AH

## 2024-04-29 NOTE — Telephone Encounter (Signed)
 Appt scheduled

## 2024-04-30 ENCOUNTER — Ambulatory Visit (INDEPENDENT_AMBULATORY_CARE_PROVIDER_SITE_OTHER): Admitting: Family

## 2024-04-30 ENCOUNTER — Encounter: Payer: Self-pay | Admitting: Family

## 2024-04-30 VITALS — BP 118/74 | HR 87 | Ht 63.0 in | Wt 248.8 lb

## 2024-04-30 DIAGNOSIS — L0292 Furuncle, unspecified: Secondary | ICD-10-CM

## 2024-04-30 DIAGNOSIS — Z1231 Encounter for screening mammogram for malignant neoplasm of breast: Secondary | ICD-10-CM

## 2024-04-30 MED ORDER — DOXYCYCLINE HYCLATE 100 MG PO TABS: 100.0000 mg | ORAL_TABLET | Freq: Two times a day (BID) | ORAL | 0 refills | Status: DC

## 2024-04-30 NOTE — Progress Notes (Signed)
 Sue [name] is a 73 y.o. female with the following history as recorded in [employer]:  Patient Active Problem List   Diagnosis Date Noted   Boil 02/19/2024   Acute vaginitis 02/19/2024   Bacteremia 01/08/2024   Benign hypertension 01/06/2024   Chronic heart failure with preserved ejection fraction (HFpEF) (HCC) 01/06/2024   Non-ST elevation (NSTEMI) myocardial infarction (HCC) 01/04/2024   Rhabdomyolysis 12/31/2023   Acute encephalopathy 09/02/2022   Hypotension 09/02/2022   Type 2 diabetes mellitus with hyperglycemia, with long-term current use of insulin  (HCC) 04/29/2022   Neuropathy 03/14/2022   Lower extremity edema 07/16/2021   Need for influenza vaccination 07/16/2021   Type 2 diabetes mellitus with stage 3b chronic kidney disease, with long-term current use of insulin  (HCC) 05/18/2021   Type 2 diabetes mellitus with diabetic polyneuropathy, with long-term current use of insulin  (HCC) 05/18/2021   Urine incontinence 05/10/2021   Rash 04/05/2021   Palpitation 04/05/2021   Acute bronchitis due to COVID-19 virus 03/09/2021   Hyperlipidemia 02/01/2021   Left knee pain 02/01/2021   Palpitations 02/01/2021   Chronic pain of both shoulders 05/29/2020   Chronic diastolic heart failure (HCC) 01/08/2020   CKD (chronic kidney disease) stage 3, GFR 30-59 ml/min (HCC) 11/13/2019   Acute blood loss anemia 11/13/2019   Lower GI bleed 08/16/2019   Symptomatic anemia 08/03/2019   QT prolongation 08/03/2019   GI bleed 08/02/2019   Diabetes mellitus with renal complications (HCC) 05/29/2019   Hyperlipidemia associated with type 2 diabetes mellitus (HCC) 05/29/2019   DVT (deep venous thrombosis) (HCC) 03/17/2019   Acute on chronic renal insufficiency 03/17/2019   Macrocytic anemia 03/17/2019   Small bowel obstruction (HCC) 03/08/2019   Fracture of left tibia 02/25/2019   Left tibial fracture 02/25/2019   Hyperglycemia 08/16/2018   AKI (acute kidney injury) (HCC) 08/15/2018   Morbid  obesity due to excess calories (HCC) 05/09/2018   Acute bronchitis with COPD (HCC) 10/19/2017   Atypical chest pain 05/01/2017   Pulmonary emphysema (HCC) 04/06/2017   Chronic seasonal allergic rhinitis 04/06/2017   GERD (gastroesophageal reflux disease) 04/06/2017   Snoring 04/06/2017   Myalgia 02/01/2017   Arthralgia 02/01/2017   Chronic pain of toe of left foot 11/14/2016   Chronic hepatitis C without hepatic coma (HCC) 08/01/2016   Hypothyroidism 10/29/2015   Mild diastolic dysfunction 01/01/2015   Edema 01/01/2015   Edema of both ankles 12/24/2014   Stroke (HCC) 2016   TIA (transient ischemic attack) 07/30/2014   Weakness 07/29/2014   Flank pain 11/12/2013   Chronic respiratory failure (HCC) 10/25/2013   DOE (dyspnea on exertion) 10/25/2013   Depression with anxiety 10/15/2013   Insomnia 10/15/2013   Essential hypertension 10/09/2013   Hypokalemia 10/09/2013    Current Outpatient Medications  Medication Sig Dispense Refill   Accu-Chek Softclix Lancets lancets USE 1  TO CHECK GLUCOSE 4 TIMES DAILY 100 each 2   aspirin  EC 81 MG tablet Take 1 tablet (81 mg total) by mouth daily. Swallow whole. 30 tablet 12   Blood Glucose Monitoring Suppl (ACCU-CHEK GUIDE) w/Device KIT USE   TO CHECK GLUCOSE UP TO 4 TIMES DAILY AS DIRECTED 1 kit 0   busPIRone  (BUSPAR ) 15 MG tablet Take 1 tablet (15 mg total) by mouth 2 (two) times daily. 60 tablet 3   doxycycline  (VIBRA -TABS) 100 MG tablet Take 1 tablet (100 mg total) by mouth 2 (two) times daily. 14 tablet 0   Dulaglutide  (TRULICITY ) 3 MG/0.5ML SOAJ Inject 3 mg into the skin once  a week. 6 mL 3   glucose blood (ACCU-CHEK GUIDE) test strip USE 1 STRIP TO CHECK GLUCOSE 4 TIMES DAILY 200 each 12   insulin  aspart (NOVOLOG  FLEXPEN) 100 UNIT/ML FlexPen Novolog  10 units with each meal PLUS the scale if needed -Novolog  correctional insulin : ADD extra units on insulin  to your meal-time Novolog  dose if your blood sugars are higher than 160. Use the scale  below to help guide you:  Blood sugar before meal Number of units to inject Less than 160 0 unit 161 -  190 1 units 191 -  220 2 units 221 -  250 3 units 251 -  280 4 units 281 -  310 5 units 311 -  340 6 units 341 -  370 7 units 371 -  400 8 units Max daily 45 units 45 mL 3   insulin  glargine, 1 Unit Dial, (TOUJEO  SOLOSTAR) 300 UNIT/ML Solostar Pen Inject 40 Units into the skin at bedtime. 6 mL 1   Insulin  Pen Needle (BD PEN NEEDLE NANO U/F) 32G X 4 MM MISC Use to inject insulin  200 each 3   levothyroxine  (SYNTHROID ) 100 MCG tablet Take 1 tablet (100 mcg total) by mouth daily before breakfast. 90 tablet 3   lidocaine  (LIDODERM ) 5 % Place 1 patch onto the skin daily. Remove & Discard patch within 12 hours or as directed by MD 30 patch 0   LORazepam  (ATIVAN ) 1 MG tablet TAKE 1/2 TO 1 AND 1/2 TABLETS AT BEDTIME AND AN ADDITIONAL 1/2 TAB ONCE A DAY AS NEEDED FOR ANXIETY 60 tablet 3   magic mouthwash w/lidocaine  SOLN Take 5 mLs by mouth 4 (four) times daily as needed for mouth pain. Swish and Spit 240 mL 0   metoprolol  succinate (TOPROL -XL) 100 MG 24 hr tablet Take 1 tablet (100 mg total) by mouth daily. Take with or immediately following a meal. 90 tablet 3   NONFORMULARY OR COMPOUNDED ITEM Beside commode #1  frequent falls, weakness 1 each 0   OXYGEN  Inhale 3-4 L/min into the lungs continuous.      pantoprazole  (PROTONIX ) 40 MG tablet Take 1 tablet (40 mg total) by mouth 2 (two) times daily. 60 tablet 1   potassium chloride  SA (KLOR-CON  M) 20 MEQ tablet Take 2 tablets (40 mEq total) by mouth daily. 180 tablet 0   pregabalin  (LYRICA ) 100 MG capsule Take 1 capsule (100 mg total) by mouth at bedtime. 30 capsule 0   QUEtiapine  (SEROQUEL ) 200 MG tablet Take 1 tablet (200 mg total) by mouth at bedtime. 90 tablet 1   rosuvastatin  (CRESTOR ) 10 MG tablet Take 1 tablet (10 mg total) by mouth daily. 90 tablet 0   senna-docusate (SENOKOT-S) 8.6-50 MG tablet Take 1 tablet by mouth daily. 30 tablet 0   torsemide   (DEMADEX ) 20 MG tablet Take 2 tablets (40 mg total) by mouth every evening. 180 tablet 0   zolpidem  (AMBIEN  CR) 12.5 MG CR tablet Take 1 tablet (12.5 mg total) by mouth at bedtime as needed. for sleep 30 tablet 3   No current facility-administered medications for this visit.    Allergies: Hydrocodone , Norvasc  [amlodipine  besylate], and Tizanidine   Past Medical History:  Diagnosis Date   Allergic rhinitis    Depression    Diabetes mellitus type 2, uncontrolled    Emphysema of lung (HCC)    3L home O2   GERD (gastroesophageal reflux disease)    Hypertension    Hypothyroidism    Obesity, morbid, BMI 50 or higher (HCC)  Stroke [facility]) 2016   TIA    Urine incontinence     Past Surgical History:  Procedure Laterality Date   ABDOMINAL HYSTERECTOMY     CESAREAN SECTION     COLONOSCOPY N/A 08/19/2019   Procedure: COLONOSCOPY;  Surgeon: Rosalie Kitchens, MD;  Location: [facility];  Service: Endoscopy;  Laterality: N/A;   COLONOSCOPY WITH PROPOFOL  N/A 08/05/2019   Procedure: COLONOSCOPY WITH PROPOFOL ;  Surgeon: Dianna Specking, MD;  Location: [facility];  Service: Gastroenterology;  Laterality: N/A;   COLONOSCOPY WITH PROPOFOL  N/A 11/19/2019   Procedure: COLONOSCOPY WITH PROPOFOL ;  Surgeon: Donnald Charleston, MD;  Location: [facility];  Service: Endoscopy;  Laterality: N/A;  Unprepped   COLONOSCOPY WITH PROPOFOL  N/A 11/22/2019   Procedure: COLONOSCOPY WITH PROPOFOL ;  Surgeon: Donnald Charleston, MD;  Location: [facility];  Service: Endoscopy;  Laterality: N/A;   COLONOSCOPY WITH PROPOFOL  N/A 11/23/2019   Procedure: COLONOSCOPY WITH PROPOFOL ;  Surgeon: Saintclair Jasper, MD;  Location: [facility];  Service: Gastroenterology;  Laterality: N/A;   COLONOSCOPY WITH PROPOFOL  N/A 11/29/2019   Procedure: COLONOSCOPY WITH PROPOFOL ;  Surgeon: Dianna Specking, MD;  Location: [facility];  Service: Endoscopy;  Laterality: N/A;   ENTEROSCOPY N/A 11/24/2019   Procedure: ENTEROSCOPY;  Surgeon: Saintclair Jasper, MD;   Location: [facility];  Service: Gastroenterology;  Laterality: N/A;   ENTEROSCOPY N/A 11/27/2019   Procedure: ENTEROSCOPY;  Surgeon: Dianna Specking, MD;  Location: [facility];  Service: Endoscopy;  Laterality: N/A;   ESOPHAGOGASTRODUODENOSCOPY N/A 11/27/2019   Procedure: ESOPHAGOGASTRODUODENOSCOPY (EGD);  Surgeon: Dianna Specking, MD;  Location: [facility];  Service: Endoscopy;  Laterality: N/A;   ESOPHAGOGASTRODUODENOSCOPY (EGD) WITH PROPOFOL  N/A 11/24/2019   Procedure: ESOPHAGOGASTRODUODENOSCOPY (EGD) WITH PROPOFOL ;  Surgeon: Saintclair Jasper, MD;  Location: [facility];  Service: Gastroenterology;  Laterality: N/A;  PUSH enteroscopy   GIVENS CAPSULE STUDY N/A 11/19/2019   Procedure: GIVENS CAPSULE STUDY;  Surgeon: Donnald Charleston, MD;  Location: [facility];  Service: Endoscopy;  Laterality: N/A;  To be performed immediately following colonoscopy   GIVENS CAPSULE STUDY N/A 11/24/2019   Procedure: GIVENS CAPSULE STUDY;  Surgeon: Saintclair Jasper, MD;  Location: [facility];  Service: Gastroenterology;  Laterality: N/A;   GIVENS CAPSULE STUDY N/A 11/28/2019   Procedure: GIVENS CAPSULE STUDY;  Surgeon: Dianna Specking, MD;  Location: [facility];  Service: Endoscopy;  Laterality: N/A;   HEMOSTASIS CLIP PLACEMENT  11/19/2019   Procedure: HEMOSTASIS CLIP PLACEMENT;  Surgeon: Donnald Charleston, MD;  Location: [facility];  Service: Endoscopy;;   HEMOSTASIS CLIP PLACEMENT  11/22/2019   Procedure: HEMOSTASIS CLIP PLACEMENT;  Surgeon: Donnald Charleston, MD;  Location: [facility];  Service: Endoscopy;;   HOT HEMOSTASIS N/A 11/24/2019   Procedure: HOT HEMOSTASIS (ARGON PLASMA COAGULATION/BICAP);  Surgeon: Saintclair Jasper, MD;  Location: [facility];  Service: Gastroenterology;  Laterality: N/A;   HOT HEMOSTASIS N/A 11/27/2019   Procedure: HOT HEMOSTASIS (ARGON PLASMA COAGULATION/BICAP);  Surgeon: Dianna Specking, MD;  Location: [facility];  Service: Endoscopy;  Laterality: N/A;   SUBMUCOSAL TATTOO INJECTION   11/19/2019   Procedure: SUBMUCOSAL TATTOO INJECTION;  Surgeon: Donnald Charleston, MD;  Location: [facility];  Service: Endoscopy;;    Family History  Problem Relation Age of Onset   Heart disease Father        MVP and Pics Valve   Hypertension Father    Depression Father        Institutionalized x's 2 years   Bipolar disorder Father    Hypertension Sister    Diabetes Sister    Hyperlipidemia Sister  Heart disease Sister 68       MI   Heart disease Brother    Hypertension Brother    Schizophrenia Paternal Aunt    Depression Paternal Aunt    Anxiety disorder Paternal Aunt    Heart disease Paternal Aunt    Schizophrenia Paternal Aunt    Heart disease Paternal Uncle    Heart disease Paternal Grandmother    Asthma Son    Asthma Son     Social History   Tobacco Use   Smoking status: Former    Current packs/day: 0.00    Average packs/day: 1 pack/day for 40.0 years (40.0 ttl pk-yrs)    Types: Cigarettes    Start date: 07/21/1972    Quit date: 10/16/2011    Years since quitting: 12.5   Smokeless tobacco: Never  Substance Use Topics   Alcohol  use: Not Currently    Comment: Occ-- Wine    Subjective:   Patient is concerned about sore on her left breast that has been draining for the past 2 days; has been using warm compresses with relief; notes she has not had a mammogram in over 10 years;    Objective:  Vitals:   04/30/24 1131  BP: 118/74  Pulse: 87  SpO2: 95%  Weight: 248 lb 12.8 oz (112.9 kg)  Height: 5' 3 (1.6 m)    General: Well developed, well nourished, in no acute distress  Skin : Warm and dry. Small scabbed lesion noted on outer left breast Head: Normocephalic and atraumatic  Eyes: Sclera and conjunctiva clear; pupils round and reactive to light; extraocular movements intact  Ears: External normal; canals clear; tympanic membranes normal  Oropharynx: Pink, supple. No suspicious lesions  Neck: Supple without thyromegaly, adenopathy  Lungs: Respirations  unlabored; wearing oxygen ;  Neurologic: Alert and oriented; speech intact; face symmetrical; uses rolling walker;  Assessment:  1. Boil   2. Screening mammogram for breast cancer     Plan:  Area has already opened and is draining on its own; she will continue with warm compresses and will treat with Doxycycline  x 7 days; stressed need to get her mammogram done- order updated again.   No follow-ups on file.  Orders Placed This Encounter  Procedures   MM Digital Screening    Standing Status:   Future    Expiration Date:   04/30/2025    Reason for Exam (SYMPTOM  OR DIAGNOSIS REQUIRED):   screening mammogram    Preferred imaging location?:   [facility]    Requested Prescriptions   Signed Prescriptions Disp Refills   doxycycline  (VIBRA -TABS) 100 MG tablet 14 tablet 0    Sig: Take 1 tablet (100 mg total) by mouth 2 (two) times daily.

## 2024-05-01 ENCOUNTER — Other Ambulatory Visit (INDEPENDENT_AMBULATORY_CARE_PROVIDER_SITE_OTHER): Admitting: Pharmacist

## 2024-05-01 DIAGNOSIS — E1165 Type 2 diabetes mellitus with hyperglycemia: Secondary | ICD-10-CM

## 2024-05-01 DIAGNOSIS — E1169 Type 2 diabetes mellitus with other specified complication: Secondary | ICD-10-CM

## 2024-05-01 DIAGNOSIS — E1122 Type 2 diabetes mellitus with diabetic chronic kidney disease: Secondary | ICD-10-CM

## 2024-05-01 DIAGNOSIS — I10 Essential (primary) hypertension: Secondary | ICD-10-CM

## 2024-05-01 MED ORDER — ROSUVASTATIN CALCIUM 10 MG PO TABS: 10.0000 mg | ORAL_TABLET | Freq: Every day | ORAL | 0 refills | Status: DC

## 2024-05-01 NOTE — Progress Notes (Signed)
 05/01/2024 Name: CONSUELLO LASSALLE MRN: [id number] DOB: June 05, 1951  Chief Complaint  Patient presents with   Medication Management   Diabetes   Hyperlipidemia    Sue [name] is a 73 y.o. year old female who presented for a telephone visit.   They were referred to the pharmacist by their PCP for assistance in managing diabetes, hypertension, hyperlipidemia, medication access, and complex medication management.    Subjective:  Care Team: Primary Care Provider: Antonio Meth, Jamee SAUNDERS, DO ; Next Scheduled Visit: not currently scheduled Cardiologist: Dr Sheena; Next Scheduled Visit: not currently scheduled Endocrinologist Dr Sam; Next Scheduled Visit: not currently scheduled Neurologist: Dr DOROTHA Potters ; Next Scheduled Visit: 07/17/2024 Psychiatrist: Redell Pizza, NP; Next Scheduled Visit: not able to see and patient was not sure.   Medication Access/Adherence  Current Pharmacy:  [facility] [address] [place] - [address] [place] [address] Phone: [phone] Fax: [phone]   Patient reports affordability concerns with their medications: No  Patient reports access/transportation concerns to their pharmacy: No  - daughter picks up medications Patient reports adherence concerns with their medications:  No    She also has not been taking rosuvastatin  - she is unsure why.    Diabetes: Managed by Dr Sam in the past - has not seen her since 08/2023  Current medications: Trulicity  3mg  weekly and Toujeo  40 units once a day at bedtime Novolog  10 units with each meal PLUS the scale if needed - per patient she is only taking per sliding scale if blood glucose is > 160. She states she rarely needs to give correctional dose.  Novolog  correctional insulin : ADD extra units on insulin  to your meal-time Novolog  dose if your blood sugars are higher than 160.  Less than 160 0 unit  161 - 190 1 units  191 - 220 2 units  221 -  250 3 units  251 - 280 4 units  281 - 310 5 units  311 - 340 6 units  341 - 370 7 units  371 - 400 8 units   Medications tried in the past: metformin  - low GFR; glipizide  -- stopped due to insulin  therapy and hypoglycemia risk  Current glucose readings: per patient - mostly 100 to 150 but on rare occasions has had blood glucose around 200.  Using ACCU-Chek Guide meter; testing 1 to 2 times per week.   Diet:  Eats usually 2 meals per day Limits pasta and potatoes.  Drinks - tea, water, sometimes regular Sprite and Coke.   Patient denies hypoglycemic s/sx including no dizziness, shakiness, sweating. Patient reports hyperglycemic symptoms including neuropathy but denies polyuria, polydipsia, polyphagia, nocturia, blurred vision.  Wt Readings from Last 3 Encounters:  04/30/24 248 lb 12.8 oz (112.9 kg)  04/23/24 253 lb (114.8 kg)  02/06/24 258 lb 3.2 oz (117.1 kg)     Hypertension:  Current medications: metoprolol  ER 100mg  daily and torsemide  20mg  - takes 2 tabs = 40mg  daily  Patient has a validated, automated, upper arm home BP cuff Current blood pressure readings readings: no recent blood pressure readings to report  Patient denies hypotensive s/sx including no dizziness, lightheadedness.  Patient denies hypertensive symptoms including no headache, chest pain, shortness of breath  BP Readings from Last 3 Encounters:  04/30/24 118/74  02/06/24 122/84  01/11/24 (!) 137/91     Hyperlipidemia/ASCVD Risk Reduction  Current lipid lowering medications: rosuastatin 10mg  daily - past due and updated refill needed.  ASCVD History: TIA Risk Factors: hypertension, Type 2 DM, Age, CHF, CKD   Medication Management / Adherence  Current adherence strategy: Daughter helps a little with medications  Per refill history review, adherence low for rosuvastatin  and potassium   Multiple comorbidities Complex medication regimen Independent pausing, stopping or controlling of the  medications  Objective:  Lab Results  Component Value Date   HGBA1C 10.3 (H) 11/07/2023    Lab Results  Component Value Date   CREATININE 1.70 (H) 02/06/2024   BUN 12 02/06/2024   NA 143 02/06/2024   K 3.3 (L) 02/06/2024   CL 97 02/06/2024   CO2 28 02/06/2024    Lab Results  Component Value Date   CHOL 86 11/07/2023   HDL 35.50 (L) 11/07/2023   LDLCALC 33 11/07/2023   TRIG 88.0 11/07/2023   CHOLHDL 2 11/07/2023    Medications Reviewed Today     Reviewed by Carla Milling, RPH-CPP (Pharmacist) on 05/01/24 at 1443  Med List Status: <None>   Medication Order Taking? Sig Documenting Provider Last Dose Status Informant  Accu-Chek Softclix Lancets lancets [id number] Yes USE 1  TO CHECK GLUCOSE 4 TIMES DAILY Antonio Cyndee Jamee JONELLE, DO  Active Child  aspirin  EC 81 MG tablet [id number] Yes Take 1 tablet (81 mg total) by mouth daily. Swallow whole. Emil Share, DO  Active   Blood Glucose Monitoring Suppl (ACCU-CHEK GUIDE) w/Device KIT [id number] Yes USE   TO CHECK GLUCOSE UP TO 4 TIMES DAILY AS DIRECTED Antonio Cyndee, Jamee JONELLE, DO  Active Child  busPIRone  (BUSPAR ) 15 MG tablet [id number] Yes Take 1 tablet (15 mg total) by mouth 2 (two) times daily. Teresa Redell LABOR, NP  Active   doxycycline  (VIBRA -TABS) 100 MG tablet [id number] Yes Take 1 tablet (100 mg total) by mouth 2 (two) times daily. Jason Leita Repine, FNP  Active   Dulaglutide  (TRULICITY ) 3 MG/0.5ML EMMANUEL [id number] Yes Inject 3 mg into the skin once a week. Emil Share, DO  Active   glucose blood (ACCU-CHEK GUIDE) test strip [id number] Yes USE 1 STRIP TO CHECK GLUCOSE 4 TIMES DAILY Antonio Cyndee, Yvonne R, DO  Active Child  insulin  aspart (NOVOLOG  FLEXPEN) 100 UNIT/ML FlexPen [id number] Yes Novolog  10 units with each meal PLUS the scale if needed -Novolog  correctional insulin : ADD extra units on insulin  to your meal-time Novolog  dose if your blood sugars are higher than 160. Use the scale below to help guide you:  Blood sugar before meal  Number of units to inject Less than 160 0 unit 161 -  190 1 units 191 -  220 2 units 221 -  250 3 units 251 -  280 4 units 281 -  310 5 units 311 -  340 6 units 341 -  370 7 units 371 -  400 8 units Max daily 45 units Emil Share, DO  Active   insulin  glargine, 1 Unit Dial, (TOUJEO  SOLOSTAR) 300 UNIT/ML Solostar Pen [id number] Yes Inject 40 Units into the skin at bedtime. Lowne Chase, Yvonne R, DO  Active   Insulin  Pen Needle (BD PEN NEEDLE NANO U/F) 32G X 4 MM MISC [id number] Yes Use to inject insulin  Shamleffer, Ibtehal Jaralla, MD  Active Child  levothyroxine  (SYNTHROID ) 100 MCG tablet [id number] Yes Take 1 tablet (100 mcg total) by mouth daily before breakfast. Emil Share, DO  Active   lidocaine  (LIDODERM ) 5 % [id number] Yes Place 1 patch onto the skin daily. Remove & Discard patch within 12 hours or as directed by MD  Patient taking differently: Place 1 patch onto the skin as needed. Remove & Discard patch within 12 hours or as directed by MD   Emil Share, DO  Active   LORazepam  (ATIVAN ) 1 MG tablet [id number] Yes TAKE 1/2 TO 1 AND 1/2 TABLETS AT BEDTIME AND AN ADDITIONAL 1/2 TAB ONCE A DAY AS NEEDED FOR ANXIETY Teresa Redell LABOR, NP  Active    Patient not taking:   Discontinued 05/01/24 1434 (Completed Course) metoprolol  succinate (TOPROL -XL) 100 MG 24 hr tablet [id number] Yes Take 1 tablet (100 mg total) by mouth daily. Take with or immediately following a meal. Haley, Sheng L, PA-C  Active   NONFORMULARY OR COMPOUNDED ITEM [id number]  Beside commode #1  frequent falls, weakness Antonio Cyndee Jamee JONELLE, [place]  Active   OXYGEN  [id number] Yes Inhale 3-4 L/min into the lungs continuous.  [provider]  Active Child  pantoprazole  (PROTONIX ) 40 MG tablet [id number]  Take 1 tablet (40 mg total) by mouth 2 (two) times daily.  Patient not taking: Reported on 05/01/2024   Emil Share, DO  Active   potassium chloride  SA (KLOR-CON  M) 20 MEQ tablet [id number] Yes Take 2 tablets (40 mEq total) by mouth daily. Lowne  Chase, Yvonne R, DO  Active   pregabalin  (LYRICA ) 100 MG capsule [id number] Yes Take 1 capsule (100 mg total) by mouth at bedtime. Emil Share, DO  Active   QUEtiapine  (SEROQUEL ) 200 MG tablet [id number] Yes Take 1 tablet (200 mg total) by mouth at bedtime. Teresa Redell LABOR, NP  Active   rosuvastatin  (CRESTOR ) 10 MG tablet [id number]  Take 1 tablet (10 mg total) by mouth daily. Antonio Cyndee, Yvonne R, DO  Active   senna-docusate (SENOKOT-S) 8.6-50 MG tablet [id number]  Take 1 tablet by mouth daily. Emil Share, DO  Active   torsemide  (DEMADEX ) 20 MG tablet [id number] Yes Take 2 tablets (40 mg total) by mouth every evening. Lowne Chase, Yvonne R, DO  Active   zolpidem  (AMBIEN  CR) 12.5 MG CR tablet [id number] Yes Take 1 tablet (12.5 mg total) by mouth at bedtime as needed. for sleep Teresa Redell LABOR, NP  Active   Med List Note Isabel Doneta RAMAN, CPhT 09/01/22 2255):                 Assessment/Plan:   Diabetes:Last A1c was not at goal of < 7.5%; Home blood glucose per patient has been at goal.  - Reviewed goal A1c, goal fasting, and goal 2 hour post prandial glucose. Patient is past due to have A1c rechecked.  - Continue to Trulicity  3mg  SQ weekly and Toujeo  40 units once a day. Recommended she use Novolog  with meals and with additional correction dose per Dr Kris last instructions.  - Recommended she call for follow up with endocrinology   - Recommend to check glucose 3 times a day / prior to meals.   Hypertension: blood pressure has been at goal of < 130/80 - Recommend to continue metoprolol  (refill requested) and torsemide .  - Discussed limiting sodium in diet and adding fruits and vegetables.  CKD 3B:  - Reminded patient to avoid nephrotoxic medications list NSAIDs / ibuprofen  - Continue to follow up with nephrologist  Hyperlipidemia/ASCVD Risk Reduction: LDL goal < 70 - last LDL was 71 - updated Rx for rosuvastatin  10mg  daily   Medication Management: - Currently strategy  insufficient to maintain appropriate adherence to prescribed medication regimen - Suggested use of weekly pill box to organize medications - Following med refills were requested-  rosuvastatin  10mg , potassium 20 mEq and metoprolol  ER 50mg   Meds ordered this encounter  Medications   rosuvastatin  (CRESTOR ) 10 MG tablet    Sig: Take 1 tablet (10 mg total) by mouth daily.    Dispense:  90 tablet    Refill:  0    Follow Up Plan: Made appointment with PCP for follow up on September 2025.  I will follow up in 4 to 6 weeks.   Madelin Ray, PharmD Clinical Pharmacist [facility]

## 2024-05-09 ENCOUNTER — Other Ambulatory Visit: Payer: Self-pay | Admitting: Behavioral Health

## 2024-05-09 DIAGNOSIS — F411 Generalized anxiety disorder: Secondary | ICD-10-CM

## 2024-05-09 DIAGNOSIS — F5105 Insomnia due to other mental disorder: Secondary | ICD-10-CM

## 2024-05-17 ENCOUNTER — Ambulatory Visit: Admitting: Family Medicine

## 2024-05-20 ENCOUNTER — Telehealth: Admitting: Family Medicine

## 2024-05-20 DIAGNOSIS — Z91199 Patient's noncompliance with other medical treatment and regimen due to unspecified reason: Secondary | ICD-10-CM

## 2024-05-20 NOTE — Progress Notes (Deleted)
 MyChart Video Visit    Virtual Visit via Video Note   This patient is at least at moderate risk for complications without adequate follow up. This format is felt to be most appropriate for this patient at this time. Physical exam was limited by quality of the video and audio technology used for the visit. *** was able to get the patient set up on a video visit.  Patient location: *** Patient and provider in visit Provider location: Office  I discussed the limitations of evaluation and management by telemedicine and the availability of in person appointments. The patient expressed understanding and agreed to proceed.  Visit Date: 05/20/2024  Today's healthcare provider: Jamee JONELLE Antonio Cyndee, DO     Subjective:    Patient ID: Sue [name], female    DOB: 1950-12-01, 73 y.o.   MRN: [id number]  No chief complaint on file.   HPI Patient is in today for ***  Past Medical History:  Diagnosis Date   Allergic rhinitis    Depression    Diabetes mellitus type 2, uncontrolled    Emphysema of lung (HCC)    3L home O2   GERD (gastroesophageal reflux disease)    Hypertension    Hypothyroidism    Obesity, morbid, BMI 50 or higher (HCC)    Stroke (HCC) 2016   TIA    Urine incontinence     Past Surgical History:  Procedure Laterality Date   ABDOMINAL HYSTERECTOMY     CESAREAN SECTION     COLONOSCOPY N/A 08/19/2019   Procedure: COLONOSCOPY;  Surgeon: Rosalie Kitchens, MD;  Location: [facility];  Service: Endoscopy;  Laterality: N/A;   COLONOSCOPY WITH PROPOFOL  N/A 08/05/2019   Procedure: COLONOSCOPY WITH PROPOFOL ;  Surgeon: Dianna Specking, MD;  Location: [facility];  Service: Gastroenterology;  Laterality: N/A;   COLONOSCOPY WITH PROPOFOL  N/A 11/19/2019   Procedure: COLONOSCOPY WITH PROPOFOL ;  Surgeon: Donnald Charleston, MD;  Location: [facility];  Service: Endoscopy;  Laterality: N/A;  Unprepped   COLONOSCOPY WITH PROPOFOL  N/A 11/22/2019   Procedure: COLONOSCOPY WITH PROPOFOL ;   Surgeon: Donnald Charleston, MD;  Location: [facility];  Service: Endoscopy;  Laterality: N/A;   COLONOSCOPY WITH PROPOFOL  N/A 11/23/2019   Procedure: COLONOSCOPY WITH PROPOFOL ;  Surgeon: Saintclair Jasper, MD;  Location: [facility];  Service: Gastroenterology;  Laterality: N/A;   COLONOSCOPY WITH PROPOFOL  N/A 11/29/2019   Procedure: COLONOSCOPY WITH PROPOFOL ;  Surgeon: Dianna Specking, MD;  [facility]: [facility];  Service: Endoscopy;  Laterality: N/A;   ENTEROSCOPY N/A 11/24/2019   Procedure: ENTEROSCOPY;  Surgeon: Saintclair Jasper, MD;  Location: [facility];  Service: Gastroenterology;  Laterality: N/A;   ENTEROSCOPY N/A 11/27/2019   Procedure: ENTEROSCOPY;  Surgeon: Dianna Specking, MD;  Location: [facility];  Service: Endoscopy;  Laterality: N/A;   ESOPHAGOGASTRODUODENOSCOPY N/A 11/27/2019   Procedure: ESOPHAGOGASTRODUODENOSCOPY (EGD);  Surgeon: Dianna Specking, MD;  Location: [facility];  Service: Endoscopy;  Laterality: N/A;   ESOPHAGOGASTRODUODENOSCOPY (EGD) WITH PROPOFOL  N/A 11/24/2019   Procedure: ESOPHAGOGASTRODUODENOSCOPY (EGD) WITH PROPOFOL ;  Surgeon: Saintclair Jasper, MD;  [facility]: [facility];  Service: Gastroenterology;  Laterality: N/A;  PUSH enteroscopy   GIVENS CAPSULE STUDY N/A 11/19/2019   Procedure: GIVENS CAPSULE STUDY;  Surgeon: Donnald Charleston, MD;  [facility]: [facility];  Service: Endoscopy;  Laterality: N/A;  To be performed immediately following colonoscopy   GIVENS CAPSULE STUDY N/A 11/24/2019   Procedure: GIVENS CAPSULE STUDY;  Surgeon: Saintclair Jasper, MD;  [facility]: [facility];  Service: Gastroenterology;  Laterality: N/A;   GIVENS CAPSULE  STUDY N/A 11/28/2019   Procedure: GIVENS CAPSULE STUDY;  Surgeon: Dianna Specking, MD;  [facility]: [facility];  Service: Endoscopy;  Laterality: N/A;   HEMOSTASIS CLIP PLACEMENT  11/19/2019   Procedure: HEMOSTASIS CLIP PLACEMENT;  Surgeon: Donnald Charleston, MD;  [facility]: [facility];  Service: Endoscopy;;   HEMOSTASIS CLIP PLACEMENT   11/22/2019   Procedure: HEMOSTASIS CLIP PLACEMENT;  Surgeon: Donnald Charleston, MD;  Location: [facility];  Service: Endoscopy;;   HOT HEMOSTASIS N/A 11/24/2019   Procedure: HOT HEMOSTASIS (ARGON PLASMA COAGULATION/BICAP);  Surgeon: Saintclair Jasper, MD;  Location: [facility];  Service: Gastroenterology;  Laterality: N/A;   HOT HEMOSTASIS N/A 11/27/2019   Procedure: HOT HEMOSTASIS (ARGON PLASMA COAGULATION/BICAP);  Surgeon: Dianna Specking, MD;  Location: [facility];  Service: Endoscopy;  Laterality: N/A;   SUBMUCOSAL TATTOO INJECTION  11/19/2019   Procedure: SUBMUCOSAL TATTOO INJECTION;  Surgeon: Donnald Charleston, MD;  Location: [facility];  Service: Endoscopy;;    Family History  Problem Relation Age of Onset   Heart disease Father        MVP and Pics Valve   Hypertension Father    Depression Father        Institutionalized x's 2 years   Bipolar disorder Father    Hypertension Sister    Diabetes Sister    Hyperlipidemia Sister    Heart disease Sister 11       MI   Heart disease Brother    Hypertension Brother    Schizophrenia Paternal Aunt    Depression Paternal Aunt    Anxiety disorder Paternal Aunt    Heart disease Paternal Aunt    Schizophrenia Paternal Aunt    Heart disease Paternal Uncle    Heart disease Paternal Grandmother    Asthma Son    Asthma Son     Social History   Socioeconomic History   Marital status: Divorced    Spouse name: Not on file   Number of children: 4   Years of education: 14   Highest education level: 12th grade  Occupational History   Occupation: disabled- Biochemist, clinical Med  Tobacco Use   Smoking status: Former    Current packs/day: 0.00    Average packs/day: 1 pack/day for 40.0 years (40.0 ttl pk-yrs)    Types: Cigarettes    Start date: 07/21/1972    Quit date: 10/16/2011    Years since quitting: 12.6   Smokeless tobacco: Never  Vaping Use   Vaping status: Never Used  Substance and Sexual Activity   Alcohol  use: Not Currently     Comment: Occ-- Wine   Drug use: No   Sexual activity: Not Currently  Other Topics Concern   Not on file  Social History Narrative      Originally from [place]. Has also lived in [place], [place]. She also previously lived in [place] for 20 years. No history of Valley Fever. Moved to [place] in 1989. 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 No pets currently. No bird exposure. Questionable previous mold exposure in her daughter's home. 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       Lives in [place] alone in assisted living.  Has multiple chronic illness.      Are you right handed or left handed? Right handed   Are you  currently employed ? no   What is your current occupation? retired   Do you live at home alone?no   Who lives with you? son   What type of home do you live in: 1 story or 2 story? Apartment first floor       Social Drivers of Health   Financial Resource Strain: Low Risk  (04/23/2024)   Overall Financial Resource Strain (CARDIA)    Difficulty of Paying Living Expenses: Not hard at all  Food Insecurity: No Food Insecurity (04/23/2024)   Hunger Vital Sign    Worried About Running Out of Food in the Last Year: Never true    Ran Out of Food in the Last Year: Never true  Transportation Needs: No Transportation Needs (04/23/2024)   PRAPARE - Administrator, Civil Service (Medical): No    Lack of Transportation (Non-Medical): No  Physical Activity: Insufficiently Active (04/23/2024)   Exercise Vital Sign    Days of Exercise per Week: 1 day    Minutes of Exercise per Session: 10 min  Stress: No Stress Concern Present (04/23/2024)   [employer] of Occupational Health - Occupational Stress Questionnaire    Feeling of Stress: Not at all  Social  Connections: Socially Isolated (04/23/2024)   Social Connection and Isolation Panel    Frequency of Communication with Friends and Family: More than three times a week    Frequency of Social Gatherings with Friends and Family: More than three times a week    Attends Religious Services: Never    Database administrator or Organizations: No    Attends Banker Meetings: Never    Marital Status: Divorced  Catering manager Violence: Not At Risk (04/23/2024)   Humiliation, Afraid, Rape, and Kick questionnaire    Fear of Current or Ex-Partner: No    Emotionally Abused: No    Physically Abused: No    Sexually Abused: No    Outpatient Medications Prior to Visit  Medication Sig Dispense Refill   Accu-Chek Softclix Lancets lancets USE 1  TO CHECK GLUCOSE 4 TIMES DAILY 100 each 2   aspirin  EC 81 MG tablet Take 1 tablet (81 mg total) by mouth daily. Swallow whole. 30 tablet 12   Blood Glucose Monitoring Suppl (ACCU-CHEK GUIDE) w/Device KIT USE   TO CHECK GLUCOSE UP TO 4 TIMES DAILY AS DIRECTED 1 kit 0   busPIRone  (BUSPAR ) 15 MG tablet Take 1 tablet (15 mg total) by mouth 2 (two) times daily. 60 tablet 3   doxycycline  (VIBRA -TABS) 100 MG tablet Take 1 tablet (100 mg total) by mouth 2 (two) times daily. 14 tablet 0   Dulaglutide  (TRULICITY ) 3 MG/0.5ML SOAJ Inject 3 mg into the skin once a week. 6 mL 3   glucose blood (ACCU-CHEK GUIDE) test strip USE 1 STRIP TO CHECK GLUCOSE 4 TIMES DAILY 200 each 12   insulin  aspart (NOVOLOG  FLEXPEN) 100 UNIT/ML FlexPen Novolog  10 units with each meal PLUS the scale if needed -Novolog  correctional insulin : ADD extra units on insulin  to your meal-time Novolog  dose if your blood sugars are higher than 160. Use the scale below to help guide you:  Blood sugar before meal Number of units to inject Less than 160 0 unit 161 -  190 1 units 191 -  220 2 units 221 -  250 3 units 251 -  280 4 units 281 -  310 5 units 311 -  340 6 units 341 -  370 7 units 371 -  400 8 units  Max daily 45 units 45 mL 3   insulin  glargine, 1 Unit Dial, (TOUJEO  SOLOSTAR) 300 UNIT/ML Solostar Pen Inject 40 Units into the skin at bedtime. 6 mL 1   Insulin  Pen Needle (BD PEN NEEDLE NANO U/F) 32G X 4 MM MISC Use to inject insulin  200 each 3   levothyroxine  (SYNTHROID ) 100 MCG tablet Take 1 tablet (100 mcg total) by mouth daily before breakfast. 90 tablet 3   lidocaine  (LIDODERM ) 5 % Place 1 patch onto the skin daily. Remove & Discard patch within 12 hours or as directed by MD (Patient taking differently: Place 1 patch onto the skin as needed. Remove & Discard patch within 12 hours or as directed by MD) 30 patch 0   LORazepam  (ATIVAN ) 1 MG tablet TAKE 1/2 TO 1 AND 1/2 TABLETS AT BEDTIME AND AN ADDITIONAL 1/2 TAB ONCE A DAY AS NEEDED FOR ANXIETY 60 tablet 3   metoprolol  succinate (TOPROL -XL) 100 MG 24 hr tablet Take 1 tablet (100 mg total) by mouth daily. Take with or immediately following a meal. 90 tablet 3   NONFORMULARY OR COMPOUNDED ITEM Beside commode #1  frequent falls, weakness 1 each 0   OXYGEN  Inhale 3-4 L/min into the lungs continuous.      pantoprazole  (PROTONIX ) 40 MG tablet Take 1 tablet (40 mg total) by mouth 2 (two) times daily. (Patient not taking: Reported on 05/01/2024) 60 tablet 1   potassium chloride  SA (KLOR-CON  M) 20 MEQ tablet Take 2 tablets (40 mEq total) by mouth daily. 180 tablet 0   pregabalin  (LYRICA ) 100 MG capsule Take 1 capsule (100 mg total) by mouth at bedtime. 30 capsule 0   QUEtiapine  (SEROQUEL ) 200 MG tablet TAKE 1 TABLET BY MOUTH AT BEDTIME. 90 tablet 1   rosuvastatin  (CRESTOR ) 10 MG tablet Take 1 tablet (10 mg total) by mouth daily. 90 tablet 0   senna-docusate (SENOKOT-S) 8.6-50 MG tablet Take 1 tablet by mouth daily. 30 tablet 0   torsemide  (DEMADEX ) 20 MG tablet Take 2 tablets (40 mg total) by mouth every evening. 180 tablet 0   zolpidem  (AMBIEN  CR) 12.5 MG CR tablet Take 1 tablet (12.5 mg total) by mouth at bedtime as needed. for sleep 30 tablet 3   No  facility-administered medications prior to visit.    Allergies  Allergen Reactions   Hydrocodone  Other (See Comments)    Constipation and hallucinations   Norvasc  [Amlodipine  Besylate] Swelling and Other (See Comments)    Marked swelling of the limbs   Tizanidine  Other (See Comments)    After the 3rd dose, the patient's mouth began to feel numb and she felt like she was a having a hot flash    ROS     Objective:    Physical Exam  There were no vitals taken for this visit. Wt Readings from Last 3 Encounters:  04/30/24 248 lb 12.8 oz (112.9 kg)  04/23/24 253 lb (114.8 kg)  02/06/24 258 lb 3.2 oz (117.1 kg)       Assessment & Plan:  There are no diagnoses linked to this encounter.   I discussed the assessment and treatment plan with the patient. The patient was provided an opportunity to ask questions and all were answered. The patient agreed with the plan and demonstrated an understanding of the instructions.   The patient was advised to call back or seek an in-person evaluation if the symptoms worsen or if the condition fails to improve as anticipated.  Cierra Rothgeb R  Antonio Meth, DO [facility] at [facility] [phone] (phone) [phone] (fax)  [facility]

## 2024-05-20 NOTE — Progress Notes (Signed)
 New Patient Office Visit  Subjective    Patient ID: Sue [name], female    DOB: [birth year]  Age: 73 y.o. MRN: [id number]  CC: No chief complaint on file.   HPI Sue [name] presents to establish care -- visit cancelled   Outpatient Encounter Medications as of 05/20/2024  Medication Sig   Accu-Chek Softclix Lancets lancets USE 1  TO CHECK GLUCOSE 4 TIMES DAILY   aspirin  EC 81 MG tablet Take 1 tablet (81 mg total) by mouth daily. Swallow whole.   Blood Glucose Monitoring Suppl (ACCU-CHEK GUIDE) w/Device KIT USE   TO CHECK GLUCOSE UP TO 4 TIMES DAILY AS DIRECTED   busPIRone  (BUSPAR ) 15 MG tablet Take 1 tablet (15 mg total) by mouth 2 (two) times daily.   doxycycline  (VIBRA -TABS) 100 MG tablet Take 1 tablet (100 mg total) by mouth 2 (two) times daily.   Dulaglutide  (TRULICITY ) 3 MG/0.5ML SOAJ Inject 3 mg into the skin once a week.   glucose blood (ACCU-CHEK GUIDE) test strip USE 1 STRIP TO CHECK GLUCOSE 4 TIMES DAILY   insulin  aspart (NOVOLOG  FLEXPEN) 100 UNIT/ML FlexPen Novolog  10 units with each meal PLUS the scale if needed -Novolog  correctional insulin : ADD extra units on insulin  to your meal-time Novolog  dose if your blood sugars are higher than 160. Use the scale below to help guide you:  Blood sugar before meal Number of units to inject Less than 160 0 unit 161 -  190 1 units 191 -  220 2 units 221 -  250 3 units 251 -  280 4 units 281 -  310 5 units 311 -  340 6 units 341 -  370 7 units 371 -  400 8 units Max daily 45 units   insulin  glargine, 1 Unit Dial, (TOUJEO  SOLOSTAR) 300 UNIT/ML Solostar Pen Inject 40 Units into the skin at bedtime.   Insulin  Pen Needle (BD PEN NEEDLE NANO U/F) 32G X 4 MM MISC Use to inject insulin    levothyroxine  (SYNTHROID ) 100 MCG tablet Take 1 tablet (100 mcg total) by mouth daily before breakfast.   lidocaine  (LIDODERM ) 5 % Place 1 patch onto the skin daily. Remove & Discard patch within 12 hours or as directed by MD (Patient taking differently: Place 1  patch onto the skin as needed. Remove & Discard patch within 12 hours or as directed by MD)   LORazepam  (ATIVAN ) 1 MG tablet TAKE 1/2 TO 1 AND 1/2 TABLETS AT BEDTIME AND AN ADDITIONAL 1/2 TAB ONCE A DAY AS NEEDED FOR ANXIETY   metoprolol  succinate (TOPROL -XL) 100 MG 24 hr tablet Take 1 tablet (100 mg total) by mouth daily. Take with or immediately following a meal.   NONFORMULARY OR COMPOUNDED ITEM Beside commode #1  frequent falls, weakness   OXYGEN  Inhale 3-4 L/min into the lungs continuous.    pantoprazole  (PROTONIX ) 40 MG tablet Take 1 tablet (40 mg total) by mouth 2 (two) times daily. (Patient not taking: Reported on 05/01/2024)   potassium chloride  SA (KLOR-CON  M) 20 MEQ tablet Take 2 tablets (40 mEq total) by mouth daily.   pregabalin  (LYRICA ) 100 MG capsule Take 1 capsule (100 mg total) by mouth at bedtime.   QUEtiapine  (SEROQUEL ) 200 MG tablet TAKE 1 TABLET BY MOUTH AT BEDTIME.   rosuvastatin  (CRESTOR ) 10 MG tablet Take 1 tablet (10 mg total) by mouth daily.   senna-docusate (SENOKOT-S) 8.6-50 MG tablet Take 1 tablet by mouth daily.   torsemide  (DEMADEX ) 20 MG tablet Take 2 tablets (  40 mg total) by mouth every evening.   zolpidem  (AMBIEN  CR) 12.5 MG CR tablet Take 1 tablet (12.5 mg total) by mouth at bedtime as needed. for sleep   No facility-administered encounter medications on file as of 05/20/2024.    Past Medical History:  Diagnosis Date   Allergic rhinitis    Depression    Diabetes mellitus type 2, uncontrolled    Emphysema of lung (HCC)    3L home O2   GERD (gastroesophageal reflux disease)    Hypertension    Hypothyroidism    Obesity, morbid, BMI 50 or higher (HCC)    Stroke (HCC) 2016   TIA    Urine incontinence     Past Surgical History:  Procedure Laterality Date   ABDOMINAL HYSTERECTOMY     CESAREAN SECTION     COLONOSCOPY N/A 08/19/2019   Procedure: COLONOSCOPY;  Surgeon: Rosalie Kitchens, MD;  Location: [facility];  Service: Endoscopy;  Laterality: N/A;    COLONOSCOPY WITH PROPOFOL  N/A 08/05/2019   Procedure: COLONOSCOPY WITH PROPOFOL ;  Surgeon: Dianna Specking, MD;  Location: [facility];  Service: Gastroenterology;  Laterality: N/A;   COLONOSCOPY WITH PROPOFOL  N/A 11/19/2019   Procedure: COLONOSCOPY WITH PROPOFOL ;  Surgeon: Donnald Charleston, MD;  Location: [facility];  Service: Endoscopy;  Laterality: N/A;  Unprepped   COLONOSCOPY WITH PROPOFOL  N/A 11/22/2019   Procedure: COLONOSCOPY WITH PROPOFOL ;  Surgeon: Donnald Charleston, MD;  Location: [facility];  Service: Endoscopy;  Laterality: N/A;   COLONOSCOPY WITH PROPOFOL  N/A 11/23/2019   Procedure: COLONOSCOPY WITH PROPOFOL ;  Surgeon: Saintclair Jasper, MD;  Location: [facility];  Service: Gastroenterology;  Laterality: N/A;   COLONOSCOPY WITH PROPOFOL  N/A 11/29/2019   Procedure: COLONOSCOPY WITH PROPOFOL ;  Surgeon: Dianna Specking, MD;  Location: [facility];  Service: Endoscopy;  Laterality: N/A;   ENTEROSCOPY N/A 11/24/2019   Procedure: ENTEROSCOPY;  Surgeon: Saintclair Jasper, MD;  Location: [facility];  Service: Gastroenterology;  Laterality: N/A;   ENTEROSCOPY N/A 11/27/2019   Procedure: ENTEROSCOPY;  Surgeon: Dianna Specking, MD;  Location: [facility];  Service: Endoscopy;  Laterality: N/A;   ESOPHAGOGASTRODUODENOSCOPY N/A 11/27/2019   Procedure: ESOPHAGOGASTRODUODENOSCOPY (EGD);  Surgeon: Dianna Specking, MD;  Location: [facility];  Service: Endoscopy;  Laterality: N/A;   ESOPHAGOGASTRODUODENOSCOPY (EGD) WITH PROPOFOL  N/A 11/24/2019   Procedure: ESOPHAGOGASTRODUODENOSCOPY (EGD) WITH PROPOFOL ;  Surgeon: Saintclair Jasper, MD;  Location: [facility];  Service: Gastroenterology;  Laterality: N/A;  PUSH enteroscopy   GIVENS CAPSULE STUDY N/A 11/19/2019   Procedure: GIVENS CAPSULE STUDY;  Surgeon: Donnald Charleston, MD;  [facility]: [facility];  Service: Endoscopy;  Laterality: N/A;  To be performed immediately following colonoscopy   GIVENS CAPSULE STUDY N/A 11/24/2019   Procedure: GIVENS CAPSULE STUDY;   Surgeon: Saintclair Jasper, MD;  [facility];  Service: Gastroenterology;  Laterality: N/A;   GIVENS CAPSULE STUDY N/A 11/28/2019   Procedure: GIVENS CAPSULE STUDY;  Surgeon: Dianna Specking, MD;  Location: [facility];  Service: Endoscopy;  Laterality: N/A;   HEMOSTASIS CLIP PLACEMENT  11/19/2019   Procedure: HEMOSTASIS CLIP PLACEMENT;  Surgeon: Donnald Charleston, MD;  Location: [facility];  Service: Endoscopy;;   HEMOSTASIS CLIP PLACEMENT  11/22/2019   Procedure: HEMOSTASIS CLIP PLACEMENT;  Surgeon: Donnald Charleston, MD;  Location: [facility];  Service: Endoscopy;;   HOT HEMOSTASIS N/A 11/24/2019   Procedure: HOT HEMOSTASIS (ARGON PLASMA COAGULATION/BICAP);  Surgeon: Saintclair Jasper, MD;  Location: [facility];  Service: Gastroenterology;  Laterality: N/A;   HOT HEMOSTASIS N/A 11/27/2019   Procedure: HOT HEMOSTASIS (ARGON PLASMA COAGULATION/BICAP);  Surgeon:  Dianna Specking, MD;  Location: [facility];  Service: Endoscopy;  Laterality: N/A;   SUBMUCOSAL TATTOO INJECTION  11/19/2019   Procedure: SUBMUCOSAL TATTOO INJECTION;  Surgeon: Donnald Charleston, MD;  Location: [facility];  Service: Endoscopy;;    Family History  Problem Relation Age of Onset   Heart disease Father        MVP and Pics Valve   Hypertension Father    Depression Father        Institutionalized x's 2 years   Bipolar disorder Father    Hypertension Sister    Diabetes Sister    Hyperlipidemia Sister    Heart disease Sister 47       MI   Heart disease Brother    Hypertension Brother    Schizophrenia Paternal Aunt    Depression Paternal Aunt    Anxiety disorder Paternal Aunt    Heart disease Paternal Aunt    Schizophrenia Paternal Aunt    Heart disease Paternal Uncle    Heart disease Paternal Grandmother    Asthma Son    Asthma Son     Social History   Socioeconomic History   Marital status: Divorced    Spouse name: Not on file   Number of children: 4   Years of education: 14   Highest education level:  12th grade  Occupational History   Occupation: disabled- Biochemist, clinical Med  Tobacco Use   Smoking status: Former    Current packs/day: 0.00    Average packs/day: 1 pack/day for 40.0 years (40.0 ttl pk-yrs)    Types: Cigarettes    Start date: 07/21/1972    Quit date: 10/16/2011    Years since quitting: 12.6   Smokeless tobacco: Never  Vaping Use   Vaping status: Never Used  Substance and Sexual Activity   Alcohol  use: Not Currently    Comment: Occ-- Wine   Drug use: No   Sexual activity: Not Currently  Other Topics Concern   Not on file  Social History Narrative      Originally from [place]. Has also lived in [place], [place]. She also previously lived in [place] for 20 years. No history of [place] Fever. Moved to [place] in 1989. 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 No pets currently. No bird exposure. Questionable previous mold exposure in her daughter's home. 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       Lives in [place] alone in assisted living.  Has multiple chronic illness.      Are you right handed or left handed? Right handed   Are you currently employed ? no   What is your current occupation? retired   Do you live at home alone?no   Who lives with you? son   What type of home do you live in: 1 story or 2 story? Apartment first floor       Social Drivers of Health   Financial Resource Strain: Low Risk  (04/23/2024)   Overall Financial Resource Strain (CARDIA)    Difficulty of Paying Living Expenses: Not hard at all  Food Insecurity: No Food Insecurity (04/23/2024)   Hunger Vital Sign    Worried About Running Out of Food in the Last Year: Never true    Ran Out of Food in the Last  Year: Never true  Transportation Needs: No Transportation Needs  (04/23/2024)   PRAPARE - Administrator, Civil Service (Medical): No    Lack of Transportation (Non-Medical): No  Physical Activity: Insufficiently Active (04/23/2024)   Exercise Vital Sign    Days of Exercise per Week: 1 day    Minutes of Exercise per Session: 10 min  Stress: No Stress Concern Present (04/23/2024)   [employer] of Occupational Health - Occupational Stress Questionnaire    Feeling of Stress: Not at all  Social Connections: Socially Isolated (04/23/2024)   Social Connection and Isolation Panel    Frequency of Communication with Friends and Family: More than three times a week    Frequency of Social Gatherings with Friends and Family: More than three times a week    Attends Religious Services: Never    Database administrator or Organizations: No    Attends Banker Meetings: Never    Marital Status: Divorced  Catering manager Violence: Not At Risk (04/23/2024)   Humiliation, Afraid, Rape, and Kick questionnaire    Fear of Current or Ex-Partner: No    Emotionally Abused: No    Physically Abused: No    Sexually Abused: No    ROS      Objective    There were no vitals taken for this visit.  Physical Exam Constitutional:      Comments: No show         Assessment & Plan:   Problem List Items Addressed This Visit   None   No follow-ups on file.   Coleta Grosshans R Lowne Chase, DO

## 2024-05-29 ENCOUNTER — Telehealth

## 2024-05-29 ENCOUNTER — Telehealth: Payer: Self-pay | Admitting: Pharmacist

## 2024-05-29 NOTE — Telephone Encounter (Signed)
 Attempt was made to contact patient by phone today for follow up by Clinical Pharmacist regarding medication adherence.  Unable to reach patient. LM on VM with my contact number [phone].

## 2024-05-30 ENCOUNTER — Other Ambulatory Visit: Payer: Self-pay | Admitting: Family Medicine

## 2024-05-30 ENCOUNTER — Telehealth: Payer: Self-pay

## 2024-05-30 DIAGNOSIS — R600 Localized edema: Secondary | ICD-10-CM

## 2024-05-30 NOTE — Addendum Note (Signed)
 Addended by: Hillary Struss D on: 05/30/2024 10:43 AM   Modules accepted: Orders

## 2024-05-30 NOTE — Telephone Encounter (Signed)
 Copied from CRM [id number]. Topic: Referral - Request for Referral >> May 30, 2024  8:48 AM Anairis L wrote: Did the patient discuss referral with their provider in the last year? Yes (If No - schedule appointment) (If Yes - send message)  Appointment offered? No  Type of order/referral and detailed reason for visit: [facility]   Preference of office, provider, location: [facility]  If referral order, have you been seen by this specialty before? No (If Yes, this issue or another issue? When? Where?  Can we respond through MyChart? Yes

## 2024-05-30 NOTE — Telephone Encounter (Signed)
 LMOM informing Pt  that screening mammogram is already ordered, she can reach [facility] at [phone] to schedule.

## 2024-06-03 ENCOUNTER — Ambulatory Visit: Payer: Self-pay

## 2024-06-03 ENCOUNTER — Other Ambulatory Visit: Payer: Self-pay

## 2024-06-03 DIAGNOSIS — I5032 Chronic diastolic (congestive) heart failure: Secondary | ICD-10-CM

## 2024-06-03 DIAGNOSIS — R0902 Hypoxemia: Secondary | ICD-10-CM

## 2024-06-03 DIAGNOSIS — R0609 Other forms of dyspnea: Secondary | ICD-10-CM

## 2024-06-03 MED ORDER — NONFORMULARY OR COMPOUNDED ITEM
0 refills | Status: AC
Start: 2024-06-03 — End: ?

## 2024-06-03 NOTE — Telephone Encounter (Signed)
 Order faxed.

## 2024-06-03 NOTE — Telephone Encounter (Signed)
 FYI Only or Action Required?: Action required by provider: needs new rx for oxygen  concentrator, states it went out.SABRA Please fax to [phone]  Patient was last seen in primary care on 05/20/2024 by Antonio Meth, Jamee SAUNDERS, DO.  Called Nurse Triage reporting oxygen  concentrator is not working.  Symptoms began today.  Interventions attempted: Nothing.  Symptoms are: stable.  Triage Disposition: Home Care  Patient/caregiver understands and will follow disposition?: Yes    Copied from CRM [id number]. Topic: Clinical - Red Word Triage >> Jun 03, 2024 10:58 AM Dedra NOVAK wrote: [facility] that prompted transfer to Nurse Triage: Pt is on oxygen  and her  home concentrator has gone out. She has a back up tank that will last about 3 hours. She needs a new prescription for a new concentrator in order to get a new one delivered today. Warm transfer to triage. Reason for Disposition  Pulse oximetry, questions about  Answer Assessment - Initial Assessment Questions 1. MAIN CONCERN OR SYMPTOM: What's your main concern? (e.g., low oxygen  level, breathing difficulty) What question do you have?     Concentrator broke 2. ONSET: When did the  this  start?      This am 3. OXYGEN  THERAPY:      States has called DME company and was told she owned the equipment and they do not service.  States needs new rx for new concentrator 4.  OXYGEN  EQUIPMENT:  Are you having any trouble with your oxygen  equipment?  (e.g., cannula, mask, tubing, tank, concentrator)     See above 5. OXYGEN  SATURATION MONITOR:       Has one tank as back up 6. OXYGEN  LEVEL: What is your reading (oxygen  level) today? What is your usual oxygen  saturation reading? (e.g., 95%)     na 7. VSS MONITORING: Do you monitor/measure your oxygen  level or vital signs? (e.g., yes, no, measurements are automatically sent to provider/call center). Document CURRENT and NORMAL BASELINE values if available.       na 8. BREATHING DIFFICULTY: Are  you having any difficulty breathing? If Yes, ask: How bad is it?  (e.g., none, mild, moderate, severe)      no 9. OTHER SYMPTOMS: Do you have any other symptoms? (e.g., fever, change in sputum)     na 10. SMOKING: Do you smoke currently? Is there anyone that smokes around you?  (Note: smoking around oxygen  is dangerous!)       na  Protocols used: Oxygen  Monitoring and Hypoxia-A-AH

## 2024-06-04 ENCOUNTER — Telehealth: Payer: Self-pay | Admitting: Family Medicine

## 2024-06-04 ENCOUNTER — Other Ambulatory Visit: Payer: Self-pay | Admitting: Family Medicine

## 2024-06-04 DIAGNOSIS — Z79899 Other long term (current) drug therapy: Secondary | ICD-10-CM

## 2024-06-04 DIAGNOSIS — E876 Hypokalemia: Secondary | ICD-10-CM

## 2024-06-04 DIAGNOSIS — N1832 Chronic kidney disease, stage 3b: Secondary | ICD-10-CM

## 2024-06-04 NOTE — Telephone Encounter (Unsigned)
 Copied from CRM #[id number]. Topic: Clinical - Medical Advice >> Jun 04, 2024  1:02 PM Donee H wrote: Reason for CRM: Patient called to ask the status of oxygen  concentrator. She states Rx was suppose to be sent to [facility] yesterday but they are saying never received. She wanted to make sure correct fax number was given. Fax number for [facility] [phone]. Patient stating really need oxygen  concentrator as soon as possible. Would like a call once Rx has been sent if possible. Follow up on either [phone] or [phone]

## 2024-06-04 NOTE — Telephone Encounter (Signed)
 Pt needs appointment for this please

## 2024-06-04 NOTE — Telephone Encounter (Signed)
 Copied from CRM #[id number]. Topic: General - Other >> Jun 04, 2024  1:12 PM Berneda F wrote: Reason for CRM: Melody from [facility] is calling to check on the order for the oxygen . Stated this is important as she needs oxygen  ASAP please. It was faxed over yesterday, and she is going to refax it now.  Melody from [facility] [phone]

## 2024-06-04 NOTE — Telephone Encounter (Signed)
I left a voicemail to schedule.

## 2024-06-10 ENCOUNTER — Other Ambulatory Visit: Admitting: Pharmacist

## 2024-06-10 DIAGNOSIS — E1122 Type 2 diabetes mellitus with diabetic chronic kidney disease: Secondary | ICD-10-CM

## 2024-06-10 DIAGNOSIS — Z1231 Encounter for screening mammogram for malignant neoplasm of breast: Secondary | ICD-10-CM

## 2024-06-10 DIAGNOSIS — I10 Essential (primary) hypertension: Secondary | ICD-10-CM

## 2024-06-10 DIAGNOSIS — E1169 Type 2 diabetes mellitus with other specified complication: Secondary | ICD-10-CM

## 2024-06-10 DIAGNOSIS — N1832 Chronic kidney disease, stage 3b: Secondary | ICD-10-CM

## 2024-06-10 DIAGNOSIS — E785 Hyperlipidemia, unspecified: Secondary | ICD-10-CM

## 2024-06-10 DIAGNOSIS — Z79899 Other long term (current) drug therapy: Secondary | ICD-10-CM

## 2024-06-10 MED ORDER — LIDOCAINE 5 % EX PTCH: MEDICATED_PATCH | CUTANEOUS | 0 refills | Status: AC

## 2024-06-10 NOTE — Progress Notes (Signed)
 06/10/2024 Name: Sue [name] MRN: [id number] DOB: [birth year]  Chief Complaint  Patient presents with   Medication Adherence   Medication Management    Sue [name] is a 73 y.o. year old female who presented for a telephone visit.   They were referred to the pharmacist by their PCP for assistance in managing diabetes, hypertension, hyperlipidemia, medication access, and complex medication management.    Subjective:  Care Team: Primary Care Provider: Antonio Meth, Jamee SAUNDERS, DO ; Next Scheduled Visit: not currently scheduled Cardiologist: Dr Sheena; Next Scheduled Visit: not currently scheduled Endocrinologist Dr Sam; Next Scheduled Visit: not currently scheduled Neurologist: Dr DOROTHA Potters ; Next Scheduled Visit: 07/17/2024 Psychiatrist: Redell Pizza, NP; Next Scheduled Visit: not able to see and patient was not sure.   Medication Access/Adherence  Current Pharmacy:  [facility] [address], [place] - [address] [place] [address] Phone: [phone] Fax: [phone]  [id number] - [place], [place] - [phone] [place] [address] Phone: [phone] Fax: [phone]   Patient reports affordability concerns with their medications: No  Patient reports access/transportation concerns to their pharmacy: No  - daughter picks up medications Patient reports adherence concerns with their medications:  No     Diabetes: Managed by Dr Sam in the past - has not seen her since 08/2023  Current medications: Trulicity  3mg  weekly and Toujeo  40 units once a day at bedtime Novolog  10 units with each meal PLUS the scale if needed - per patient she is only taking per sliding scale if blood glucose is > 160. She states she rarely needs to give correctional dose.  Novolog  correctional insulin : ADD extra units on insulin  to your meal-time Novolog  dose if your blood sugars are higher than 160.  Less than  160 0 unit  161 - 190 1 units  191 - 220 2 units  221 - 250 3 units  251 - 280 4 units  281 - 310 5 units  311 - 340 6 units  341 - 370 7 units  371 - 400 8 units   Medications tried in the past: metformin  - low GFR; glipizide  -- stopped due to insulin  therapy and hypoglycemia risk  Current glucose readings: per patient - mostly 100 to 150  Lowest - 63 - patient drank Orange juice to increase - rechecked about 20 minutes later and was 100.  Highest - 220 -after a meal yesterday.  Patient reports occastional hypoglycemic s/sx including dizziness, shakiness, sweating.  Patient reports hyperglycemic symptoms including neuropathy but denies polyuria, polydipsia, polyphagia, nocturia, blurred vision.  Wt Readings from Last 3 Encounters:  04/30/24 248 lb 12.8 oz (112.9 kg)  04/23/24 253 lb (114.8 kg)  02/06/24 258 lb 3.2 oz (117.1 kg)     Hypertension:  Current medications: metoprolol  ER 100mg  daily and torsemide  20mg  - takes 2 tabs = 40mg  daily  Patient has a validated, automated, upper arm home BP cuff Current blood pressure readings readings: no recent blood pressure readings to report  Patient denies hypotensive s/sx including no dizziness, lightheadedness.  Patient denies hypertensive symptoms including no headache, chest pain, shortness of breath  BP Readings from Last 3 Encounters:  04/30/24 118/74  02/06/24 122/84  01/11/24 (!) 137/91     Hyperlipidemia/ASCVD Risk Reduction  Current lipid lowering medications: rosuastatin 10mg  daily - has filled on time recently  ASCVD History: TIA Risk Factors: hypertension, Type 2 DM, Age, CHF, CKD   Medication  Management / Adherence  Current adherence strategy: Daughter helps a little with medications  Per refill history review, adherence low for rosuvastatin  and potassium   Multiple comorbidities Complex medication regimen Independent pausing, stopping or controlling of the medications   Patient asks about refill  for lidocaine  patches - originally prescribed by ED doctor in May 2025 - patient has been using as needed. Feels like lidocaine  patches does help with pain but doesn't use often.   Patient also asked about mammogram. Reports she was seen for left breast pain and boil in August and mammogram was ordered but has not been contacted to schedule yet.   Objective:  Lab Results  Component Value Date   HGBA1C 10.3 (H) 11/07/2023    Lab Results  Component Value Date   CREATININE 1.70 (H) 02/06/2024   BUN 12 02/06/2024   NA 143 02/06/2024   K 3.3 (L) 02/06/2024   CL 97 02/06/2024   CO2 28 02/06/2024    Lab Results  Component Value Date   CHOL 86 11/07/2023   HDL 35.50 (L) 11/07/2023   LDLCALC 33 11/07/2023   TRIG 88.0 11/07/2023   CHOLHDL 2 11/07/2023    Medications Reviewed Today     Reviewed by Carla Milling, RPH-CPP (Pharmacist) on 06/10/24 at 1531  Med List Status: <None>   Medication Order Taking? Sig Documenting Provider Last Dose Status Informant  Accu-Chek Softclix Lancets lancets [id number]  USE 1  TO CHECK GLUCOSE 4 TIMES DAILY Antonio Cyndee Jamee JONELLE, DO  Active Child  aspirin  EC 81 MG tablet [id number] Yes Take 1 tablet (81 mg total) by mouth daily. Swallow whole. Emil Share, DO  Active   Blood Glucose Monitoring Suppl (ACCU-CHEK GUIDE) w/Device KIT [id number]  USE   TO CHECK GLUCOSE UP TO 4 TIMES DAILY AS DIRECTED Antonio Cyndee, Yvonne R, DO  Active Child  busPIRone  (BUSPAR ) 15 MG tablet [id number] Yes Take 1 tablet (15 mg total) by mouth 2 (two) times daily. Teresa Redell LABOR, NP  Active    Patient not taking:   Discontinued 06/10/24 1518 (Completed Course)   Dulaglutide  (TRULICITY ) 3 MG/0.5ML SOAJ [id number] Yes Inject 3 mg into the skin once a week. Emil Share, DO  Active   glucose blood (ACCU-CHEK GUIDE) test strip [id number]  USE 1 STRIP TO CHECK GLUCOSE 4 TIMES DAILY Antonio Cyndee, Jamee JONELLE, DO  Active Child  insulin  aspart (NOVOLOG  FLEXPEN) 100 UNIT/ML FlexPen [id number] Yes  Novolog  10 units with each meal PLUS the scale if needed -Novolog  correctional insulin : ADD extra units on insulin  to your meal-time Novolog  dose if your blood sugars are higher than 160. Use the scale below to help guide you:  Blood sugar before meal Number of units to inject Less than 160 0 unit 161 -  190 1 units 191 -  220 2 units 221 -  250 3 units 251 -  280 4 units 281 -  310 5 units 311 -  340 6 units 341 -  370 7 units 371 -  400 8 units Max daily 45 units Emil Share, DO  Active   insulin  glargine, 1 Unit Dial, (TOUJEO  SOLOSTAR) 300 UNIT/ML Solostar Pen [id number] Yes Inject 40 Units into the skin at bedtime. Antonio Cyndee, Yvonne R, DO  Active   Insulin  Pen Needle (BD PEN NEEDLE NANO U/F) 32G X 4 MM MISC [id number] Yes Use to inject insulin  Shamleffer, Ibtehal Jaralla, MD  Active Child  levothyroxine  (SYNTHROID ) 100 MCG tablet [id number] Yes Take 1 tablet (100  mcg total) by mouth daily before breakfast. Emil Share, DO  Active   lidocaine  (LIDODERM ) 5 % [id number]  Place 1 patch onto the skin daily. Remove & Discard patch within 12 hours or as directed by MD  Patient taking differently: Place 1 patch onto the skin as needed. Remove & Discard patch within 12 hours or as directed by MD   Emil Share, DO  Active   LORazepam  (ATIVAN ) 1 MG tablet [id number] Yes TAKE 1/2 TO 1 AND 1/2 TABLETS AT BEDTIME AND AN ADDITIONAL 1/2 TAB ONCE A DAY AS NEEDED FOR ANXIETY White, Redell LABOR, NP  Active   metoprolol  succinate (TOPROL -XL) 100 MG 24 hr tablet [id number] Yes Take 1 tablet (100 mg total) by mouth daily. Take with or immediately following a meal. Darryle Thom LITTIE DEVONNA  Active   NONFORMULARY OR COMPOUNDED ITEM [id number]  Beside commode #1  frequent falls, weakness Antonio Cyndee Jamee JONELLE, [place]  Active   NONFORMULARY OR COMPOUNDED ITEM [id number]  oxygen  concentrator for oxygen  Antonio Cyndee Jamee JONELLE, DO  Active   OXYGEN  [id number] Yes Inhale 3-4 L/min into the lungs continuous.  [provider]  Active Child   pantoprazole  (PROTONIX ) 40 MG tablet [id number]  Take 1 tablet (40 mg total) by mouth 2 (two) times daily.  Patient not taking: Reported on 06/10/2024   Emil Share, DO  Active   potassium chloride  SA (KLOR-CON  M) 20 MEQ tablet [id number] Yes Take 2 tablets (40 mEq total) by mouth daily. Antonio Cyndee, Yvonne R, DO  Active   pregabalin  (LYRICA ) 100 MG capsule [id number] Yes Take 1 capsule (100 mg total) by mouth at bedtime. Emil Share, DO  Active   QUEtiapine  (SEROQUEL ) 200 MG tablet [id number] Yes TAKE 1 TABLET BY MOUTH AT BEDTIME. Teresa Redell LABOR, NP  Active   rosuvastatin  (CRESTOR ) 10 MG tablet [id number] Yes Take 1 tablet (10 mg total) by mouth daily. Antonio Cyndee, Jamee JONELLE, DO  Active   senna-docusate (SENOKOT-S) 8.6-50 MG tablet [id number] Yes Take 1 tablet by mouth daily.  Patient taking differently: Take 1 tablet by mouth daily as needed.   Emil Share, DO  Active   torsemide  (DEMADEX ) 20 MG tablet [id number] Yes Take 2 tablets (40 mg total) by mouth every evening. Antonio Cyndee, Yvonne R, DO  Active   zolpidem  (AMBIEN  CR) 12.5 MG CR tablet [id number] Yes Take 1 tablet (12.5 mg total) by mouth at bedtime as needed. for sleep Teresa Redell LABOR, NP  Active   Med List Note Isabel Doneta RAMAN, CPhT 09/01/22 2255):                 Assessment/Plan:   Diabetes:Last A1c was not at goal of < 7.5%; Home blood glucose per patient has been at goal.  - Reviewed goal A1c, goal fasting, and goal 2 hour post prandial glucose. Patient is past due to have A1c rechecked.  - Continue to Trulicity  3mg  SQ weekly and Toujeo  40 units once a day. Recommended she use Novolog  with meals and with additional correction dose per Dr Kris last instructions.  - Recommended she call for follow up with endocrinology   - Recommend to check glucose 3 times a day / prior to meals.   Hypertension: blood pressure has been at goal of < 130/80 - Recommend to continue metoprolol  and torsemide .   CKD 3B:  - Reminded patient to  avoid nephrotoxic medications list NSAIDs / ibuprofen  - Continue to follow up with nephrologist  Hyperlipidemia/ASCVD Risk Reduction: LDL goal <  70 - last LDL was 71 - continue rosuvastatin  10mg  daily   Medication Management: - Currently strategy insufficient to maintain appropriate adherence to prescribed medication regimen - Suggested use of weekly pill box to organize medications - Forwarded request for lidocaine  patch refill to PCP  Left breast pain:  - Sent message to PCP for diagnostic mammogram - I think she will need diagnostic mammo instead of screening given her symptoms. Diagnostic mammogram will be done at [facility].     Follow Up Plan: 1 month  Madelin Ray, PharmD Clinical Pharmacist [facility]

## 2024-06-11 ENCOUNTER — Other Ambulatory Visit: Payer: Self-pay | Admitting: Family Medicine

## 2024-06-11 DIAGNOSIS — N644 Mastodynia: Secondary | ICD-10-CM

## 2024-06-19 ENCOUNTER — Other Ambulatory Visit

## 2024-06-19 ENCOUNTER — Encounter

## 2024-06-21 ENCOUNTER — Other Ambulatory Visit: Payer: Self-pay | Admitting: Neurology

## 2024-06-21 DIAGNOSIS — R209 Unspecified disturbances of skin sensation: Secondary | ICD-10-CM

## 2024-06-21 DIAGNOSIS — E1142 Type 2 diabetes mellitus with diabetic polyneuropathy: Secondary | ICD-10-CM

## 2024-06-24 ENCOUNTER — Other Ambulatory Visit: Payer: Self-pay | Admitting: Behavioral Health

## 2024-06-24 DIAGNOSIS — F411 Generalized anxiety disorder: Secondary | ICD-10-CM

## 2024-06-25 NOTE — Telephone Encounter (Signed)
 Refill documented that The original prescription was discontinued on 01/08/2024 by Caleen Qualia, MD for the following reason: Stop Taking at Discharge. Renewing this prescription may not be appropriate. Approval to send

## 2024-07-08 NOTE — Progress Notes (Deleted)
 I saw Sue [name] in neurology clinic on 07/17/24 in follow up for neuropathy.  HPI: Sue [name] is a 73 y.o. year old female with a history of DM, HFpEF, hypothyroidism, HTN, COPD, depression, anxiety, possible TIA in 2016, and former smoker who we last saw on 05/05/23.  To briefly review: Initial consultation (06/09/22): Symptoms started about 11/2021. She has a burning and stinging pain in her feet and legs. She feels like something is squeezing her legs. She has the feeling of [place] in her legs. She feels that the left side is worse than the right. She reports that her left leg pain is near constant while the right seems more intermittent. The pain is mostly from the calf down, but occasional cramping in her thighs. She denies back pain, neck pain, and only rare headaches. She denies numbness or tingling in her hands or arms. Patient walks with a walker for the last 5 years (~2018). She has not had any recent falls.   Patient saw PCP (Dr. Antonio Meth) on 03/14/22 and mentioned stinging and burning in legs that was worse in the evening. She denied back pain. Patient was tried on gabapentin  100 mg that did not help. She did not feel good on the medication, so she stopped it. She took it about a week. She was referred to neurology for further evaluation.   Patient is on seroquel  200 mg at night to help sleep.   She takes vitamin D  and vitamin B12 supplements. She is also taking a nerve supplement from [employer]) that she thinks is helping.    The patient denies symptoms suggestive of oculobulbar weakness including diplopia, ptosis, dysphagia, poor saliva control, dysarthria/dysphonia, impaired mastication, facial weakness/droop.   The patient does not report symptoms referable to autonomic dysfunction including impaired sweating, heat or cold intolerance, excessive mucosal dryness, gastroparetic early satiety, postprandial abdominal bloating, constipation, bowel or bladder  dyscontrol, or syncope/presyncope/orthostatic intolerance.   She does not report any constitutional symptoms like fever, night sweats, anorexia or unintentional weight loss.   EtOH use: None  Restrictive diet? No Family history of neuropathy/myopathy/NM disease? None   She had not had any physical therapy recently (as of 06/09/22).   02/03/23: MMA and B6 were normal. IFE was indeterminate. EMG on 11/27/21 showed a possible mild sensory predominant neuropathy (vs age related changes). Patient was not getting adequate relief from gabapentin , so was switched to Lyrica  25 mg BID on 11/30/22.    Patient was to have a virtual visit on 12/14/22, but technical problems did not allow us  to connect. Per phone conversation, patient was doing okay without acute issues, so follow up was rescheduled.   Patient called on 01/06/23 still having leg pain. Lyrica  was increased to 50 mg BID on 01/06/23.    Patient is still having a lot of pain. She has pain in feet all the way into the chest. It is a stabbing pain or a bad itch. It can go from feet to thighs. It can wake her up from her sleep. Most of the pain is in her feet and can go up to knee. She is having a lot of knee pain and has had to have injections previously. She has not had the injections in over a year. She has not talked to anyone about this. She is not moving around well currently. She does not feel she is eating well. She has lost about 50 pounds in the last month or so, which she  attributes to not eating. She denies fever or night sweats.   Patient usually has edema in her legs, but now does not have edema and has a lot of pain.    Lyrica  was increased to 50 mg TID on 02/03/23.   05/05/23: Labs showed HbA1c of 9.9 (up from 7.7 one year prior), IFE with no M protein, and normal B1. I recommended patient discuss glucose control with PCP to prevent worsening neuropathy among other complications. Patient has not yet seen her DM doctor.   Patient continues  to have burning and stabbing pain in her feet. After increasing Lyrica  to 50 mg TID, patient did have some improvement at first, but things got worse again. She is not having side effects to the medication.   Patient was bit by a spider a few months ago in the back of the neck. She is not sure it is healing well. She may have some numbness in her right arm since, more in the 4th and 5th digits.  Most recent Assessment and Plan (05/05/23): This is Sue [name], a 73 y.o. female with pain in bilateral lower extremities. 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 She has previously failed gabapentin  and continues to have increase in pain despite increasing doses of Lyrica . She does get some relief, but it does not last long enough. We discussed her worsening HbA1c today (9.9 from 7.7 a year prior) that is likely driving the worsening neuropathy. Patient understands that Lyrica  may help with neuropathy, but glucose control is the key to preventing continued worsening. Finally, she describes significant knee pain, so perhaps there is an overlapping MSK component as well.   Plan: -Will increase Lyrica  to 50mg /50mg /100mg  -Patient will discuss DM management with endocrinologist -Patient will discuss bite on neck with PCP, does not look infected to me though -Will monitor RUE symptoms closely  Since their last visit: ***  Patient had a fall and on the ground for > 24 hours. She was hospitalized and found to have elevated CK that improved with hydration. She also had fever and leukocytosis and received antibiotics. Short term inpatient rehab was recommended but patient declined and was discharged home with home health.  ***   MEDICATIONS:  Outpatient Encounter Medications as of 07/17/2024  Medication Sig   Accu-Chek Softclix Lancets lancets USE 1  TO CHECK GLUCOSE 4 TIMES DAILY   aspirin  EC 81 MG tablet Take 1 tablet (81 mg total) by mouth daily. Swallow whole.   Blood Glucose  Monitoring Suppl (ACCU-CHEK GUIDE) w/Device KIT USE   TO CHECK GLUCOSE UP TO 4 TIMES DAILY AS DIRECTED   busPIRone  (BUSPAR ) 15 MG tablet Take 1 tablet (15 mg total) by mouth 2 (two) times daily.   Dulaglutide  (TRULICITY ) 3 MG/0.5ML SOAJ Inject 3 mg into the skin once a week.   glucose blood (ACCU-CHEK GUIDE) test strip USE 1 STRIP TO CHECK GLUCOSE 4 TIMES DAILY   insulin  aspart (NOVOLOG  FLEXPEN) 100 UNIT/ML FlexPen Novolog  10 units with each meal PLUS the scale if needed -Novolog  correctional insulin : ADD extra units on insulin  to your meal-time Novolog  dose if your blood sugars are higher than 160. Use the scale below to help guide you:  Blood sugar before meal Number of units to inject Less than 160 0 unit 161 -  190 1 units 191 -  220 2 units 221 -  250 3 units 251 -  280 4 units 281 -  310 5 units 311 -  340 6 units 341 -  370 7 units 371 -  400 8 units Max daily 45 units   insulin  glargine, 1 Unit Dial, (TOUJEO  SOLOSTAR) 300 UNIT/ML Solostar Pen Inject 40 Units into the skin at bedtime.   Insulin  Pen Needle (BD PEN NEEDLE NANO U/F) 32G X 4 MM MISC Use to inject insulin    levothyroxine  (SYNTHROID ) 100 MCG tablet Take 1 tablet (100 mcg total) by mouth daily before breakfast.   lidocaine  (LIDODERM ) 5 % Apply 1 patch to area of pain for up to 12 hours. Remove & Discard patch within 12 hours. May repeat once daily.   LORazepam  (ATIVAN ) 1 MG tablet TAKE 1/2 TO 1 AND 1/2 TABLETS AT BEDTIME AND AN ADDITIONAL 1/2 TAB ONCE A DAY AS NEEDED FOR ANXIETY   metoprolol  succinate (TOPROL -XL) 100 MG 24 hr tablet Take 1 tablet (100 mg total) by mouth daily. Take with or immediately following a meal.   NONFORMULARY OR COMPOUNDED ITEM Beside commode #1  frequent falls, weakness   NONFORMULARY OR COMPOUNDED ITEM oxygen  concentrator for oxygen    OXYGEN  Inhale 3-4 L/min into the lungs continuous.    pantoprazole  (PROTONIX ) 40 MG tablet Take 1 tablet (40 mg total) by mouth 2 (two) times daily. (Patient not taking:  Reported on 06/10/2024)   potassium chloride  SA (KLOR-CON  M) 20 MEQ tablet Take 2 tablets (40 mEq total) by mouth daily.   pregabalin  (LYRICA ) 100 MG capsule Take 1 capsule (100 mg total) by mouth at bedtime.   pregabalin  (LYRICA ) 50 MG capsule TAKE 1 CAPSULE BY MOUTH EVERY MORNING PLUS 1 CAP MIDDAY, PLUS 2 CAPS IN THE EVENING   QUEtiapine  (SEROQUEL ) 200 MG tablet TAKE 1 TABLET BY MOUTH AT BEDTIME.   rosuvastatin  (CRESTOR ) 10 MG tablet Take 1 tablet (10 mg total) by mouth daily.   senna-docusate (SENOKOT-S) 8.6-50 MG tablet Take 1 tablet by mouth daily. (Patient taking differently: Take 1 tablet by mouth daily as needed.)   torsemide  (DEMADEX ) 20 MG tablet Take 2 tablets (40 mg total) by mouth every evening.   zolpidem  (AMBIEN  CR) 12.5 MG CR tablet Take 1 tablet (12.5 mg total) by mouth at bedtime as needed. for sleep   No facility-administered encounter medications on file as of 07/17/2024.    PAST MEDICAL HISTORY: Past Medical History:  Diagnosis Date   Allergic rhinitis    Depression    Diabetes mellitus type 2, uncontrolled    Emphysema of lung (HCC)    3L home O2   GERD (gastroesophageal reflux disease)    Hypertension    Hypothyroidism    Obesity, morbid, BMI 50 or higher (HCC)    Stroke (HCC) 2016   TIA    Urine incontinence     PAST SURGICAL HISTORY: Past Surgical History:  Procedure Laterality Date   ABDOMINAL HYSTERECTOMY     CESAREAN SECTION     COLONOSCOPY N/A 08/19/2019   Procedure: COLONOSCOPY;  Surgeon: Rosalie Kitchens, MD;  Location: [facility];  Service: Endoscopy;  Laterality: N/A;   COLONOSCOPY WITH PROPOFOL  N/A 08/05/2019   Procedure: COLONOSCOPY WITH PROPOFOL ;  Surgeon: Dianna Specking, MD;  Location: [facility];  Service: Gastroenterology;  Laterality: N/A;   COLONOSCOPY WITH PROPOFOL  N/A 11/19/2019   Procedure: COLONOSCOPY WITH PROPOFOL ;  Surgeon: Donnald Charleston, MD;  Location: [facility];  Service: Endoscopy;  Laterality: N/A;  Unprepped   COLONOSCOPY  WITH PROPOFOL  N/A 11/22/2019   Procedure: COLONOSCOPY WITH PROPOFOL ;  Surgeon: Donnald Charleston, MD;  Location: [facility];  Service: Endoscopy;  Laterality:  N/A;   COLONOSCOPY WITH PROPOFOL  N/A 11/23/2019   Procedure: COLONOSCOPY WITH PROPOFOL ;  Surgeon: Saintclair Jasper, MD;  Location: [facility];  Service: Gastroenterology;  Laterality: N/A;   COLONOSCOPY WITH PROPOFOL  N/A 11/29/2019   Procedure: COLONOSCOPY WITH PROPOFOL ;  Surgeon: Dianna Specking, MD;  Location: [facility];  Service: Endoscopy;  Laterality: N/A;   ENTEROSCOPY N/A 11/24/2019   Procedure: ENTEROSCOPY;  Surgeon: Saintclair Jasper, MD;  Location: [facility];  Service: Gastroenterology;  Laterality: N/A;   ENTEROSCOPY N/A 11/27/2019   Procedure: ENTEROSCOPY;  Surgeon: Dianna Specking, MD;  Location: [facility];  Service: Endoscopy;  Laterality: N/A;   ESOPHAGOGASTRODUODENOSCOPY N/A 11/27/2019   Procedure: ESOPHAGOGASTRODUODENOSCOPY (EGD);  Surgeon: Dianna Specking, MD;  Location: [facility];  Service: Endoscopy;  Laterality: N/A;   ESOPHAGOGASTRODUODENOSCOPY (EGD) WITH PROPOFOL  N/A 11/24/2019   Procedure: ESOPHAGOGASTRODUODENOSCOPY (EGD) WITH PROPOFOL ;  Surgeon: Saintclair Jasper, MD;  Location: [facility];  Service: Gastroenterology;  Laterality: N/A;  PUSH enteroscopy   GIVENS CAPSULE STUDY N/A 11/19/2019   Procedure: GIVENS CAPSULE STUDY;  Surgeon: Donnald Charleston, MD;  [facility]: [facility];  Service: Endoscopy;  Laterality: N/A;  To be performed immediately following colonoscopy   GIVENS CAPSULE STUDY N/A 11/24/2019   Procedure: GIVENS CAPSULE STUDY;  Surgeon: Saintclair Jasper, MD;  Location: [facility];  Service: Gastroenterology;  Laterality: N/A;   GIVENS CAPSULE STUDY N/A 11/28/2019   Procedure: GIVENS CAPSULE STUDY;  Surgeon: Dianna Specking, MD;  Location: [facility];  Service: Endoscopy;  Laterality: N/A;   HEMOSTASIS CLIP PLACEMENT  11/19/2019   Procedure: HEMOSTASIS CLIP PLACEMENT;  Surgeon: Donnald Charleston, MD;  Location:  [facility];  Service: Endoscopy;;   HEMOSTASIS CLIP PLACEMENT  11/22/2019   Procedure: HEMOSTASIS CLIP PLACEMENT;  Surgeon: Donnald Charleston, MD;  Location: [facility];  Service: Endoscopy;;   HOT HEMOSTASIS N/A 11/24/2019   Procedure: HOT HEMOSTASIS (ARGON PLASMA COAGULATION/BICAP);  Surgeon: Saintclair Jasper, MD;  Location: [facility];  Service: Gastroenterology;  Laterality: N/A;   HOT HEMOSTASIS N/A 11/27/2019   Procedure: HOT HEMOSTASIS (ARGON PLASMA COAGULATION/BICAP);  Surgeon: Dianna Specking, MD;  Location: [facility];  Service: Endoscopy;  Laterality: N/A;   SUBMUCOSAL TATTOO INJECTION  11/19/2019   Procedure: SUBMUCOSAL TATTOO INJECTION;  Surgeon: Donnald Charleston, MD;  Location: [facility];  Service: Endoscopy;;    ALLERGIES: Allergies  Allergen Reactions   Hydrocodone  Other (See Comments)    Constipation and hallucinations   Norvasc  [Amlodipine  Besylate] Swelling and Other (See Comments)    Marked swelling of the limbs   Tizanidine  Other (See Comments)    After the 3rd dose, the patient's mouth began to feel numb and she felt like she was a having a hot flash    FAMILY HISTORY: Family History  Problem Relation Age of Onset   Heart disease Father        MVP and Pics Valve   Hypertension Father    Depression Father        Institutionalized x's 2 years   Bipolar disorder Father    Hypertension Sister    Diabetes Sister    Hyperlipidemia Sister    Heart disease Sister 89       MI   Heart disease Brother    Hypertension Brother    Schizophrenia Paternal Aunt    Depression Paternal Aunt    Anxiety disorder Paternal Aunt    Heart disease Paternal Aunt    Schizophrenia Paternal Aunt    Heart disease Paternal Uncle    Heart disease Paternal Grandmother    Asthma  Son    Asthma Son     SOCIAL HISTORY: Social History   Tobacco Use   Smoking status: Former    Current packs/day: 0.00    Average packs/day: 1 pack/day for 40.0 years (40.0 ttl pk-yrs)    Types:  Cigarettes    Start date: 07/21/1972    Quit date: 10/16/2011    Years since quitting: 12.7   Smokeless tobacco: Never  Vaping Use   Vaping status: Never Used  Substance Use Topics   Alcohol  use: Not Currently    Comment: Occ-- Wine   Drug use: No   Social History   Social History Narrative      Originally from [place]. Has also lived in [place], [place]. She also previously lived in [place] for 20 years. No history of [place] Fever. Moved to  in 1989. 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 No pets currently. No bird exposure. Questionable previous mold exposure in her daughter's home. 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       Lives in [place] alone in assisted living.  Has multiple chronic illness.      Are you right handed or left handed? Right handed   Are you currently employed ? no   What is your current occupation? retired   Do you live at home alone?no   Who lives with you? son   What type of home do you live in: 1 story or 2 story? Apartment first floor        Objective:  Vital Signs:  There were no vitals taken for this visit.  General:*** General appearance: Awake and alert. No distress. Cooperative with exam.  Skin: No obvious rash or jaundice. HEENT: Atraumatic. Anicteric. Lungs: Non-labored breathing on room air  Heart: Regular Abdomen: Soft, non tender. Extremities: No edema. No obvious deformity.  Musculoskeletal: No obvious joint swelling.  Neurological: Mental Status: Alert. Speech fluent. No pseudobulbar affect Cranial Nerves: CNII: No RAPD. Visual fields intact. CNIII, IV, VI: PERRL. No nystagmus. EOMI. CN V: Facial sensation intact bilaterally to fine touch. [place] clench strong. Jaw jerk***. CN VII: Facial  muscles symmetric and strong. No ptosis at rest or after sustained upgaze***. CN VIII: Hears finger rub well bilaterally. CN IX: No hypophonia. CN X: Palate elevates symmetrically. CN XI: Full strength shoulder shrug bilaterally. CN XII: Tongue protrusion full and midline. No atrophy or fasciculations. No significant dysarthria*** Motor: Tone is ***. *** fasciculations in *** extremities. *** atrophy. No grip or percussive myotonia.  Individual muscle group testing (MRC grade out of 5):  Movement     Neck flexion ***    Neck extension ***     Right Left   Shoulder abduction *** ***   Shoulder adduction *** ***   Shoulder ext rotation *** ***   Shoulder int rotation *** ***   Elbow flexion *** ***   Elbow extension *** ***   Wrist extension *** ***   Wrist flexion *** ***   Finger abduction - FDI *** ***   Finger abduction - ADM *** ***   Finger extension *** ***   Finger distal flexion - 2/3 *** ***   Finger distal flexion - 4/5 *** ***   Thumb flexion - FPL *** ***   Thumb  abduction - APB *** ***    Hip flexion *** ***   Hip extension *** ***   Hip adduction *** ***   Hip abduction *** ***   Knee extension *** ***   Knee flexion *** ***   Dorsiflexion *** ***   Plantarflexion *** ***   Inversion *** ***   Eversion *** ***   Great toe extension *** ***   Great toe flexion *** ***     Reflexes:  Right Left  Bicep *** ***  Tricep *** ***  BrRad *** ***  Knee *** ***  Ankle *** ***   Pathological Reflexes: Babinski: *** response bilaterally*** Hoffman: *** Troemner: *** Pectoral: *** Palmomental: *** Facial: *** Midline tap: *** Sensation: Pinprick: *** Vibration: *** Temperature: *** Proprioception: *** Coordination: Intact finger-to- nose-finger and heel-to-shin bilaterally. Romberg negative.*** Gait: Able to Sue from chair with arms crossed unassisted. Normal, narrow-based gait. Able to tandem walk. Able to walk on toes and heels.***   Lab and  Test Review: New results: 08/21/23: TSH wnl Free T4 wnl HbA1c: 13.5   HbA1c (11/07/23): 10.3 CK (12/31/23): 1427 CK (01/01/24): 1021 CK (01/02/24): 606 CK (01/04/24): 325 CK (01/07/24): 94   02/06/24: CBC significant for MCV 98 BMP significant for Cr 1.70   CT head wo contrast (01/04/24): IMPRESSION: 1. No acute intracranial abnormality. 2. Chronic right maxillary sinusitis.   Cervical spine xray (11/07/23): FINDINGS: Broad-based reversal of normal lordosis. No listhesis. No evidence of acute fracture. The posterior elements are well aligned. Mild disc space narrowing and spurring C5-C6 and C6-C7. Bilateral C4-C5 facet hypertrophy, left greater than right. No prevertebral soft tissue thickening.   IMPRESSION: 1. No radiographic evidence of fracture or subluxation of the cervical spine. 2. Mild degenerative disc disease at C5-C6 and C6-C7. 3. Bilateral C4-C5 facet hypertrophy, left greater than right.   Previously reviewed results: 02/03/23: IFE: no M protein HbA1c: 9.9 B1 wnl   03/03/23: CBC significant for chronic anemia (Hb 11.9) CMP significant for K of 3.3 (chronic), Cr 1.63 (chronic) TSH mildly elevated at 5.94   06/09/22: IFE: poorly defined area of restricted protein mobility (reactive with IgG and kappa) B6 wnl MMA wnl   TSH (09/01/22): 5.455 BMP (10/28/22): significant for K 3.0, glucose 382, Cr 1.95 CMP (12/23/22): K of 2.9, glucose 431, Cr 1.76, AST 47, ALT 23 CBC (10/28/22): unremarkable  Normal or unremarkable:  03/14/22: TSH: 15.21 HbA1c: 7.7 CMP significant for elevated glucose (201) and Cr (1.38) CBC significant for elevated MCV (106.3) B12 (01/07/20): 448   EMG (11/28/22): NCV & EMG Findings: Extensive electrodiagnostic evaluation of the left lower limb with additional nerve conduction studies of the left upper limb shows: Left sural and superficial peroneal/fibular sensory responses are absent. Left median, ulnar, and radial sensory responses are  within normal limits. Left peroneal/fibular (EDB), tibial (AH), median (APB), and ulnar (ADM) motor responses are within normal limits. Left H reflex is absent. There is no evidence of active or chronic motor axon loss changes affecting any of the tested muscles on needle examination. Motor unit configuration and recruitment pattern is within normal limits.   Impression: The findings above are most consistent with the following: Absent lower limb sensory responses and H reflex may be normal at this age or represent evidence of a large fiber sensory predominant neuropathy. No electrodiagnostic evidence of a left lumbosacral (L3-S1) motor radiculopathy. No electrodiagnostic evidence of a left median mononeuropathy at or distal to the wrist (ie: carpal tunnel syndrome). Screening studies for  left ulnar and radial mononeuropathies are normal.   CT head wo contrast (for mental status change in setting of hyperglycemia) (10/28/22): FINDINGS: Brain: No acute territorial infarction, hemorrhage or intracranial mass. Mild atrophy. Nonenlarged ventricles.   Vascular: No hyperdense vessels.  Carotid vascular calcification.   Skull: Normal. Negative for fracture or focal lesion.   Sinuses/Orbits: Old fracture deformity medial wall left orbit. Moderate mucosal thickening in the right maxillary sinus with scattered calcific deposits.   Other: None.   IMPRESSION: No CT evidence for acute intracranial abnormality. Mild atrophy.   MRI brain wo contrast (01/01/20): CLINICAL DATA:  TIA. Transient left facial numbness and slurred speech.   EXAM: MRI HEAD WITHOUT CONTRAST   TECHNIQUE: Multiplanar, multiecho pulse sequences of the brain and surrounding structures were obtained without intravenous contrast.   COMPARISON:  Head CT 01/01/2020 and MRI 07/30/2014   FINDINGS: Brain: There is no evidence of acute infarct, intracranial hemorrhage, mass, midline shift, or extra-axial fluid  collection. The ventricles and sulci are normal. T2 hyperintensities in the cerebral white matter bilaterally are unchanged from the prior MRI and nonspecific but compatible with minimal chronic small vessel ischemic disease.   Vascular: Major intracranial vascular flow voids are preserved.   Skull and upper cervical spine: Unremarkable bone marrow signal.   Sinuses/Orbits: Remote medial left orbital fracture. Chronic right sphenoid sinusitis. Clear mastoid air cells.   Other: None.   IMPRESSION: 1. No acute intracranial abnormality. 2. Minimal chronic small vessel ischemic disease.  ASSESSMENT: This is Sue [name], a 73 y.o. female with:  ***  Plan: ***  Return to clinic in ***  Total time spent reviewing records, interview, history/exam, documentation, and coordination of care on day of encounter:  *** min  Venetia Potters, MD

## 2024-07-16 ENCOUNTER — Other Ambulatory Visit

## 2024-07-16 ENCOUNTER — Encounter

## 2024-07-17 ENCOUNTER — Ambulatory Visit: Admitting: Neurology

## 2024-07-17 ENCOUNTER — Encounter: Payer: Self-pay | Admitting: Neurology

## 2024-07-17 DIAGNOSIS — Z029 Encounter for administrative examinations, unspecified: Secondary | ICD-10-CM

## 2024-07-20 ENCOUNTER — Other Ambulatory Visit: Payer: Self-pay | Admitting: Family Medicine

## 2024-07-31 ENCOUNTER — Other Ambulatory Visit: Payer: Self-pay | Admitting: Family Medicine

## 2024-08-02 ENCOUNTER — Other Ambulatory Visit: Payer: Self-pay | Admitting: Family Medicine

## 2024-08-16 ENCOUNTER — Other Ambulatory Visit: Payer: Self-pay | Admitting: Neurology

## 2024-08-16 ENCOUNTER — Telehealth: Payer: Self-pay | Admitting: Neurology

## 2024-08-16 DIAGNOSIS — E1142 Type 2 diabetes mellitus with diabetic polyneuropathy: Secondary | ICD-10-CM

## 2024-08-16 DIAGNOSIS — R209 Unspecified disturbances of skin sensation: Secondary | ICD-10-CM

## 2024-08-16 NOTE — Telephone Encounter (Signed)
 Called pt and reported Dr. Leigh answer she will call back for scheduling.

## 2024-08-16 NOTE — Telephone Encounter (Signed)
 Pt called in this morning and she will like a refill on her prescription called:  Pregabalin . Thanks

## 2024-08-20 ENCOUNTER — Ambulatory Visit: Admitting: Family Medicine

## 2024-08-22 ENCOUNTER — Telehealth: Payer: Self-pay | Admitting: Neurology

## 2024-08-22 NOTE — Telephone Encounter (Signed)
 Sue [name](Self)  PH: [phone]  Kairah called in wanting to speak with [facility] regarding Rx refill. She stated that she has been having chest pain, shortness of Breathe, anxiety, and jittery off and on. She wanted to know if she could get enough meds to last until next scheduled appt in jan.

## 2024-08-23 ENCOUNTER — Telehealth: Payer: Self-pay | Admitting: Neurology

## 2024-08-23 ENCOUNTER — Other Ambulatory Visit: Payer: Self-pay | Admitting: Neurology

## 2024-08-23 DIAGNOSIS — R209 Unspecified disturbances of skin sensation: Secondary | ICD-10-CM

## 2024-08-23 DIAGNOSIS — E1142 Type 2 diabetes mellitus with diabetic polyneuropathy: Secondary | ICD-10-CM

## 2024-08-23 NOTE — Telephone Encounter (Signed)
 LMOVM to call the office back. And has she reached out to her PCP in regards to the symptoms as well.

## 2024-08-23 NOTE — Telephone Encounter (Signed)
 Pt called in this afternoon and she is returning call. Thanks

## 2024-08-23 NOTE — Telephone Encounter (Signed)
 FayeBETHA List  PH: [phone]  LVM stating she missed call and would like a call back.

## 2024-08-23 NOTE — Telephone Encounter (Signed)
 Patient advised to go to the ED if her Symptoms worse. This sounds like she needs to be evaluated.    Patient state if she gets worse she will go.

## 2024-08-23 NOTE — Telephone Encounter (Signed)
 Pt cld Rx pregabalin  (LYRICA ) 100 MG capsule need ASAP refill to [facility] [address] and would like a call as she as been sick

## 2024-08-26 ENCOUNTER — Ambulatory Visit: Payer: Self-pay

## 2024-08-26 ENCOUNTER — Telehealth: Payer: Self-pay | Admitting: Pharmacist

## 2024-08-26 ENCOUNTER — Other Ambulatory Visit: Payer: Self-pay | Admitting: Family Medicine

## 2024-08-26 MED ORDER — PREGABALIN 100 MG PO CAPS: 100.0000 mg | ORAL_CAPSULE | Freq: Every day | ORAL | 0 refills | Status: DC

## 2024-08-26 NOTE — Progress Notes (Unsigned)
 I saw Sue DEVARGAS in neurology clinic on 08/30/24 in follow up for neuropathy.  HPI: Sue [name] is a 73 y.o. year old female with a history of DM, HFpEF, hypothyroidism, HTN, COPD, depression, anxiety, possible TIA in 2016, and former smoker who we last saw on 05/05/23.  To briefly review: Initial consultation (06/09/22): Symptoms started about 11/2021. She has a burning and stinging pain in her feet and legs. She feels like something is squeezing her legs. She has the feeling of [place]orses in her legs. She feels that the left side is worse than the right. She reports that her left leg pain is near constant while the right seems more intermittent. The pain is mostly from the calf down, but occasional cramping in her thighs. She denies back pain, neck pain, and only rare headaches. She denies numbness or tingling in her hands or arms. Patient walks with a walker for the last 5 years (~2018). She has not had any recent falls.   Patient saw PCP (Dr. Antonio Meth) on 03/14/22 and mentioned stinging and burning in legs that was worse in the evening. She denied back pain. Patient was tried on gabapentin  100 mg that did not help. She did not feel good on the medication, so she stopped it. She took it about a week. She was referred to neurology for further evaluation.   Patient is on seroquel  200 mg at night to help sleep.   She takes vitamin D  and vitamin B12 supplements. She is also taking a nerve supplement from [employer]) that she thinks is helping.    The patient denies symptoms suggestive of oculobulbar weakness including diplopia, ptosis, dysphagia, poor saliva control, dysarthria/dysphonia, impaired mastication, facial weakness/droop.   The patient does not report symptoms referable to autonomic dysfunction including impaired sweating, heat or cold intolerance, excessive mucosal dryness, gastroparetic early satiety, postprandial abdominal bloating, constipation, bowel or bladder  dyscontrol, or syncope/presyncope/orthostatic intolerance.   She does not report any constitutional symptoms like fever, night sweats, anorexia or unintentional weight loss.   EtOH use: None  Restrictive diet? No Family history of neuropathy/myopathy/NM disease? None   She had not had any physical therapy recently (as of 06/09/22).   02/03/23: MMA and B6 were normal. IFE was indeterminate. EMG on 11/27/21 showed a possible mild sensory predominant neuropathy (vs age related changes). Patient was not getting adequate relief from gabapentin , so was switched to Lyrica  25 mg BID on 11/30/22.    Patient was to have a virtual visit on 12/14/22, but technical problems did not allow us  to connect. Per phone conversation, patient was doing okay without acute issues, so follow up was rescheduled.   Patient called on 01/06/23 still having leg pain. Lyrica  was increased to 50 mg BID on 01/06/23.    Patient is still having a lot of pain. She has pain in feet all the way into the chest. It is a stabbing pain or a bad itch. It can go from feet to thighs. It can wake her up from her sleep. Most of the pain is in her feet and can go up to knee. She is having a lot of knee pain and has had to have injections previously. She has not had the injections in over a year. She has not talked to anyone about this. She is not moving around well currently. She does not feel she is eating well. She has lost about 50 pounds in the last month or so, which she  attributes to not eating. She denies fever or night sweats.   Patient usually has edema in her legs, but now does not have edema and has a lot of pain.    Lyrica  was increased to 50 mg TID on 02/03/23.   05/05/23: Labs showed HbA1c of 9.9 (up from 7.7 one year prior), IFE with no M protein, and normal B1. I recommended patient discuss glucose control with PCP to prevent worsening neuropathy among other complications. Patient has not yet seen her DM doctor.   Patient continues  to have burning and stabbing pain in her feet. After increasing Lyrica  to 50 mg TID, patient did have some improvement at first, but things got worse again. She is not having side effects to the medication.   Patient was bit by a spider a few months ago in the back of the neck. She is not sure it is healing well. She may have some numbness in her right arm since, more in the 4th and 5th digits.  Most recent Assessment and Plan (05/05/23): This is Sue [name], a 73 y.o. female with pain in bilateral lower extremities. 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 She has previously failed gabapentin  and continues to have increase in pain despite increasing doses of Lyrica . She does get some relief, but it does not last long enough. We discussed her worsening HbA1c today (9.9 from 7.7 a year prior) that is likely driving the worsening neuropathy. Patient understands that Lyrica  may help with neuropathy, but glucose control is the key to preventing continued worsening. Finally, she describes significant knee pain, so perhaps there is an overlapping MSK component as well.   Plan: -Will increase Lyrica  to 50mg /50mg /100mg  -Patient will discuss DM management with endocrinologist -Patient will discuss bite on neck with PCP, does not look infected to me though -Will monitor RUE symptoms closely -Follow up in 6 months  Since their last visit: ***  Patient had a fall and on the ground for > 24 hours. She was hospitalized and found to have elevated CK that improved with hydration. She also had fever and leukocytosis and received antibiotics. Short term inpatient rehab was recommended but patient declined and was discharged home with home health.  ***  No show on 07/17/24***  Was out of Lyrica  and having anxiety, chest pain, and shortness of breath. I recommended she go to ED.  ROS: Pertinent positive and negative systems reviewed in HPI. ***   MEDICATIONS:  Outpatient Encounter  Medications as of 08/30/2024  Medication Sig   Accu-Chek Softclix Lancets lancets USE 1  TO CHECK GLUCOSE 4 TIMES DAILY   aspirin  EC 81 MG tablet Take 1 tablet (81 mg total) by mouth daily. Swallow whole.   Blood Glucose Monitoring Suppl (ACCU-CHEK GUIDE) w/Device KIT USE   TO CHECK GLUCOSE UP TO 4 TIMES DAILY AS DIRECTED   busPIRone  (BUSPAR ) 15 MG tablet Take 1 tablet (15 mg total) by mouth 2 (two) times daily.   Dulaglutide  (TRULICITY ) 3 MG/0.5ML SOAJ Inject 3 mg into the skin once a week.   glucose blood (ACCU-CHEK GUIDE TEST) test strip USE 1  4 TIMES DAILY   insulin  aspart (NOVOLOG  FLEXPEN) 100 UNIT/ML FlexPen Novolog  10 units with each meal PLUS the scale if needed -Novolog  correctional insulin : ADD extra units on insulin  to your meal-time Novolog  dose if your blood sugars are higher than 160. Use the scale below to help guide you:  Blood sugar before meal Number of units  to inject Less than 160 0 unit 161 -  190 1 units 191 -  220 2 units 221 -  250 3 units 251 -  280 4 units 281 -  310 5 units 311 -  340 6 units 341 -  370 7 units 371 -  400 8 units Max daily 45 units   insulin  glargine, 1 Unit Dial, (TOUJEO  SOLOSTAR) 300 UNIT/ML Solostar Pen Inject 40 Units into the skin at bedtime.   Insulin  Pen Needle (BD PEN NEEDLE NANO U/F) 32G X 4 MM MISC Use to inject insulin    levothyroxine  (SYNTHROID ) 100 MCG tablet Take 1 tablet (100 mcg total) by mouth daily before breakfast.   lidocaine  (LIDODERM ) 5 % Apply 1 patch to area of pain for up to 12 hours. Remove & Discard patch within 12 hours. May repeat once daily.   LORazepam  (ATIVAN ) 1 MG tablet TAKE 1/2 TO 1 AND 1/2 TABLETS AT BEDTIME AND AN ADDITIONAL 1/2 TAB ONCE A DAY AS NEEDED FOR ANXIETY   metoprolol  succinate (TOPROL -XL) 100 MG 24 hr tablet Take 1 tablet (100 mg total) by mouth daily. Take with or immediately following a meal.   NONFORMULARY OR COMPOUNDED ITEM Beside commode #1  frequent falls, weakness   NONFORMULARY OR COMPOUNDED ITEM  oxygen  concentrator for oxygen    OXYGEN  Inhale 3-4 L/min into the lungs continuous.    pantoprazole  (PROTONIX ) 40 MG tablet Take 1 tablet (40 mg total) by mouth 2 (two) times daily. (Patient not taking: Reported on 06/10/2024)   potassium chloride  SA (KLOR-CON  M) 20 MEQ tablet Take 2 tablets (40 mEq total) by mouth daily.   pregabalin  (LYRICA ) 100 MG capsule Take 1 capsule (100 mg total) by mouth at bedtime.   QUEtiapine  (SEROQUEL ) 200 MG tablet TAKE 1 TABLET BY MOUTH AT BEDTIME.   rosuvastatin  (CRESTOR ) 10 MG tablet Take 1 tablet by mouth once daily   senna-docusate (SENOKOT-S) 8.6-50 MG tablet Take 1 tablet by mouth daily. (Patient taking differently: Take 1 tablet by mouth daily as needed.)   torsemide  (DEMADEX ) 20 MG tablet Take 2 tablets (40 mg total) by mouth every evening.   zolpidem  (AMBIEN  CR) 12.5 MG CR tablet Take 1 tablet (12.5 mg total) by mouth at bedtime as needed. for sleep   No facility-administered encounter medications on file as of 08/30/2024.    PAST MEDICAL HISTORY: Past Medical History:  Diagnosis Date   Allergic rhinitis    Depression    Diabetes mellitus type 2, uncontrolled    Emphysema of lung (HCC)    3L home O2   GERD (gastroesophageal reflux disease)    Hypertension    Hypothyroidism    Obesity, morbid, BMI 50 or higher (HCC)    Stroke (HCC) 2016   TIA    Urine incontinence     PAST SURGICAL HISTORY: Past Surgical History:  Procedure Laterality Date   ABDOMINAL HYSTERECTOMY     CESAREAN SECTION     COLONOSCOPY N/A 08/19/2019   Procedure: COLONOSCOPY;  Surgeon: Rosalie Kitchens, MD;  Location: [facility];  Service: Endoscopy;  Laterality: N/A;   COLONOSCOPY WITH PROPOFOL  N/A 08/05/2019   Procedure: COLONOSCOPY WITH PROPOFOL ;  Surgeon: Dianna Specking, MD;  Location: [facility];  Service: Gastroenterology;  Laterality: N/A;   COLONOSCOPY WITH PROPOFOL  N/A 11/19/2019   Procedure: COLONOSCOPY WITH PROPOFOL ;  Surgeon: Donnald Charleston, MD;  Location: [facility];  Service: Endoscopy;  Laterality: N/A;  Unprepped   COLONOSCOPY WITH PROPOFOL  N/A 11/22/2019   Procedure: COLONOSCOPY WITH PROPOFOL ;  Surgeon: Donnald Charleston, MD;  Location: [facility];  Service: Endoscopy;  Laterality: N/A;   COLONOSCOPY WITH PROPOFOL  N/A 11/23/2019   Procedure: COLONOSCOPY WITH PROPOFOL ;  Surgeon: Saintclair Jasper, MD;  Location: [facility];  Service: Gastroenterology;  Laterality: N/A;   COLONOSCOPY WITH PROPOFOL  N/A 11/29/2019   Procedure: COLONOSCOPY WITH PROPOFOL ;  Surgeon: Dianna Specking, MD;  Location: [facility];  Service: Endoscopy;  Laterality: N/A;   ENTEROSCOPY N/A 11/24/2019   Procedure: ENTEROSCOPY;  Surgeon: Saintclair Jasper, MD;  Location: [facility];  Service: Gastroenterology;  Laterality: N/A;   ENTEROSCOPY N/A 11/27/2019   Procedure: ENTEROSCOPY;  Surgeon: Dianna Specking, MD;  Location: [facility];  Service: Endoscopy;  Laterality: N/A;   ESOPHAGOGASTRODUODENOSCOPY N/A 11/27/2019   Procedure: ESOPHAGOGASTRODUODENOSCOPY (EGD);  Surgeon: Dianna Specking, MD;  Location: [facility];  Service: Endoscopy;  Laterality: N/A;   ESOPHAGOGASTRODUODENOSCOPY (EGD) WITH PROPOFOL  N/A 11/24/2019   Procedure: ESOPHAGOGASTRODUODENOSCOPY (EGD) WITH PROPOFOL ;  Surgeon: Saintclair Jasper, MD;  Location: [facility];  Service: Gastroenterology;  Laterality: N/A;  PUSH enteroscopy   GIVENS CAPSULE STUDY N/A 11/19/2019   Procedure: GIVENS CAPSULE STUDY;  Surgeon: Donnald Charleston, MD;  [facility]: [facility];  Service: Endoscopy;  Laterality: N/A;  To be performed immediately following colonoscopy   GIVENS CAPSULE STUDY N/A 11/24/2019   Procedure: GIVENS CAPSULE STUDY;  Surgeon: Saintclair Jasper, MD;  [facility]: [facility];  Service: Gastroenterology;  Laterality: N/A;   GIVENS CAPSULE STUDY N/A 11/28/2019   Procedure: GIVENS CAPSULE STUDY;  Surgeon: Dianna Specking, MD;  Location: [facility];  Service: Endoscopy;  Laterality: N/A;   HEMOSTASIS CLIP PLACEMENT  11/19/2019    Procedure: HEMOSTASIS CLIP PLACEMENT;  Surgeon: Donnald Charleston, MD;  Location: [facility];  Service: Endoscopy;;   HEMOSTASIS CLIP PLACEMENT  11/22/2019   Procedure: HEMOSTASIS CLIP PLACEMENT;  Surgeon: Donnald Charleston, MD;  Location: [facility];  Service: Endoscopy;;   HOT HEMOSTASIS N/A 11/24/2019   Procedure: HOT HEMOSTASIS (ARGON PLASMA COAGULATION/BICAP);  Surgeon: Saintclair Jasper, MD;  Location: [facility];  Service: Gastroenterology;  Laterality: N/A;   HOT HEMOSTASIS N/A 11/27/2019   Procedure: HOT HEMOSTASIS (ARGON PLASMA COAGULATION/BICAP);  Surgeon: Dianna Specking, MD;  Location: [facility];  Service: Endoscopy;  Laterality: N/A;   SUBMUCOSAL TATTOO INJECTION  11/19/2019   Procedure: SUBMUCOSAL TATTOO INJECTION;  Surgeon: Donnald Charleston, MD;  Location: [facility];  Service: Endoscopy;;    ALLERGIES: Allergies[1]  FAMILY HISTORY: Family History  Problem Relation Age of Onset   Heart disease Father        MVP and Pics Valve   Hypertension Father    Depression Father        Institutionalized x's 2 years   Bipolar disorder Father    Hypertension Sister    Diabetes Sister    Hyperlipidemia Sister    Heart disease Sister 53       MI   Heart disease Brother    Hypertension Brother    Schizophrenia Paternal Aunt    Depression Paternal Aunt    Anxiety disorder Paternal Aunt    Heart disease Paternal Aunt    Schizophrenia Paternal Aunt    Heart disease Paternal Uncle    Heart disease Paternal Grandmother    Asthma Son    Asthma Son     SOCIAL HISTORY: Social History[2] Social History   Social History Narrative      Originally from [place]. Has also lived in [place], [place]. She also previously lived in [place] for 20 years. No history of [place] Fever. Moved to  [place] in 1989. 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 No pets currently. No bird exposure. Questionable previous mold exposure in her daughter's home. 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       Lives in [place] alone in assisted living.  Has multiple chronic illness.      Are you right handed or left handed? Right handed   Are you currently employed ? no   What is your current occupation? retired   Do you live at home alone?no   Who lives with you? son   What type of home do you live in: 1 story or 2 story? Apartment first floor        Objective:  Vital Signs:  There were no vitals taken for this visit.  General:*** General appearance: Awake and alert. No distress. Cooperative with exam.  Skin: No obvious rash or jaundice. HEENT: Atraumatic. Anicteric. Lungs: Non-labored breathing on room air  Heart: Regular Abdomen: Soft, non tender. Extremities: No edema. No obvious deformity.  Musculoskeletal: No obvious joint swelling.  Neurological: Mental Status: Alert. Speech fluent. No pseudobulbar affect Cranial Nerves: CNII: No RAPD. Visual fields intact. CNIII, IV, VI: PERRL. No nystagmus. EOMI. CN V: Facial sensation intact bilaterally to fine touch. [place] clench strong. Jaw jerk***. CN VII: Facial muscles symmetric and strong. No ptosis at rest or after sustained upgaze***. CN VIII: Hears finger rub well bilaterally. CN IX: No hypophonia. CN X: Palate elevates symmetrically. CN XI: Full strength shoulder shrug bilaterally. CN XII: Tongue protrusion full and midline. No atrophy or fasciculations. No significant dysarthria*** Motor: Tone is ***. *** fasciculations in *** extremities. *** atrophy. No grip or percussive myotonia.  Individual muscle group testing (MRC grade out of 5):  Movement     Neck flexion ***    Neck extension ***     Right Left   Shoulder abduction *** ***   Shoulder adduction *** ***   Shoulder ext rotation *** ***    Shoulder int rotation *** ***   Elbow flexion *** ***   Elbow extension *** ***   Wrist extension *** ***   Wrist flexion *** ***   Finger abduction - FDI *** ***   Finger abduction - ADM *** ***   Finger extension *** ***   Finger distal flexion - 2/3 *** ***   Finger distal flexion - 4/5 *** ***   Thumb flexion - FPL *** ***   Thumb abduction - APB *** ***    Hip flexion *** ***   Hip extension *** ***   Hip adduction *** ***   Hip abduction *** ***   Knee extension *** ***   Knee flexion *** ***   Dorsiflexion *** ***   Plantarflexion *** ***   Inversion *** ***   Eversion *** ***   Great toe extension *** ***   Great toe flexion *** ***     Reflexes:  Right Left  Bicep *** ***  Tricep *** ***  BrRad *** ***  Knee *** ***  Ankle *** ***   Pathological Reflexes: Babinski: *** response bilaterally*** Hoffman: *** Troemner: *** Pectoral: *** Palmomental: *** Facial: *** Midline tap: *** Sensation: Pinprick: *** Vibration: *** Temperature: *** Proprioception: *** Coordination: Intact finger-to- nose-finger and heel-to-shin bilaterally. Romberg  negative.*** Gait: Able to rise from chair with arms crossed unassisted. Normal, narrow-based gait. Able to tandem walk. Able to walk on toes and heels.***   Lab and Test Review: New results: 08/21/23: TSH wnl Free T4 wnl HbA1c: 13.5   HbA1c (11/07/23): 10.3 CK (12/31/23): 1427 CK (01/01/24): 1021 CK (01/02/24): 606 CK (01/04/24): 325 CK (01/07/24): 94   02/06/24: CBC significant for MCV 98 BMP significant for Cr 1.70   CT head wo contrast (01/04/24): IMPRESSION: 1. No acute intracranial abnormality. 2. Chronic right maxillary sinusitis.   Cervical spine xray (11/07/23): FINDINGS: Broad-based reversal of normal lordosis. No listhesis. No evidence of acute fracture. The posterior elements are well aligned. Mild disc space narrowing and spurring C5-C6 and C6-C7. Bilateral C4-C5 facet hypertrophy, left greater  than right. No prevertebral soft tissue thickening.   IMPRESSION: 1. No radiographic evidence of fracture or subluxation of the cervical spine. 2. Mild degenerative disc disease at C5-C6 and C6-C7. 3. Bilateral C4-C5 facet hypertrophy, left greater than right.   Previously reviewed results: 02/03/23: IFE: no M protein HbA1c: 9.9 B1 wnl   03/03/23: CBC significant for chronic anemia (Hb 11.9) CMP significant for K of 3.3 (chronic), Cr 1.63 (chronic) TSH mildly elevated at 5.94   06/09/22: IFE: poorly defined area of restricted protein mobility (reactive with IgG and kappa) B6 wnl MMA wnl   TSH (09/01/22): 5.455 BMP (10/28/22): significant for K 3.0, glucose 382, Cr 1.95 CMP (12/23/22): K of 2.9, glucose 431, Cr 1.76, AST 47, ALT 23 CBC (10/28/22): unremarkable  Normal or unremarkable:  03/14/22: TSH: 15.21 HbA1c: 7.7 CMP significant for elevated glucose (201) and Cr (1.38) CBC significant for elevated MCV (106.3) B12 (01/07/20): 448   EMG (11/28/22): NCV & EMG Findings: Extensive electrodiagnostic evaluation of the left lower limb with additional nerve conduction studies of the left upper limb shows: Left sural and superficial peroneal/fibular sensory responses are absent. Left median, ulnar, and radial sensory responses are within normal limits. Left peroneal/fibular (EDB), tibial (AH), median (APB), and ulnar (ADM) motor responses are within normal limits. Left H reflex is absent. There is no evidence of active or chronic motor axon loss changes affecting any of the tested muscles on needle examination. Motor unit configuration and recruitment pattern is within normal limits.   Impression: The findings above are most consistent with the following: Absent lower limb sensory responses and H reflex may be normal at this age or represent evidence of a large fiber sensory predominant neuropathy. No electrodiagnostic evidence of a left lumbosacral (L3-S1) motor radiculopathy. No  electrodiagnostic evidence of a left median mononeuropathy at or distal to the wrist (ie: carpal tunnel syndrome). Screening studies for left ulnar and radial mononeuropathies are normal.   CT head wo contrast (for mental status change in setting of hyperglycemia) (10/28/22): FINDINGS: Brain: No acute territorial infarction, hemorrhage or intracranial mass. Mild atrophy. Nonenlarged ventricles.   Vascular: No hyperdense vessels.  Carotid vascular calcification.   Skull: Normal. Negative for fracture or focal lesion.   Sinuses/Orbits: Old fracture deformity medial wall left orbit. Moderate mucosal thickening in the right maxillary sinus with scattered calcific deposits.   Other: None.   IMPRESSION: No CT evidence for acute intracranial abnormality. Mild atrophy.   MRI brain wo contrast (01/01/20): CLINICAL DATA:  TIA. Transient left facial numbness and slurred speech.   EXAM: MRI HEAD WITHOUT CONTRAST   TECHNIQUE: Multiplanar, multiecho pulse sequences of the brain and surrounding structures were obtained without intravenous contrast.   COMPARISON:  Head  CT 01/01/2020 and MRI 07/30/2014   FINDINGS: Brain: There is no evidence of acute infarct, intracranial hemorrhage, mass, midline shift, or extra-axial fluid collection. The ventricles and sulci are normal. T2 hyperintensities in the cerebral white matter bilaterally are unchanged from the prior MRI and nonspecific but compatible with minimal chronic small vessel ischemic disease.   Vascular: Major intracranial vascular flow voids are preserved.   Skull and upper cervical spine: Unremarkable bone marrow signal.   Sinuses/Orbits: Remote medial left orbital fracture. Chronic right sphenoid sinusitis. Clear mastoid air cells.   Other: None.   IMPRESSION: 1. No acute intracranial abnormality. 2. Minimal chronic small vessel ischemic disease.  ASSESSMENT: This is Nichole FORBES Goodell, a 73 y.o. female  with:  ***  Plan: ***  Return to clinic in ***  Total time spent reviewing records, interview, history/exam, documentation, and coordination of care on day of encounter:  *** min  Venetia Potters, MD    [1]  Allergies Allergen Reactions   Hydrocodone  Other (See Comments)    Constipation and hallucinations   Norvasc  [Amlodipine  Besylate] Swelling and Other (See Comments)    Marked swelling of the limbs   Tizanidine  Other (See Comments)    After the 3rd dose, the patient's mouth began to feel numb and she felt like she was a having a hot flash  [2]  Social History Tobacco Use   Smoking status: Former    Current packs/day: 0.00    Average packs/day: 1 pack/day for 40.0 years (40.0 ttl pk-yrs)    Types: Cigarettes    Start date: 07/21/1972    Quit date: 10/16/2011    Years since quitting: 12.8   Smokeless tobacco: Never  Vaping Use   Vaping status: Never Used  Substance Use Topics   Alcohol  use: Not Currently    Comment: Occ-- Wine   Drug use: No

## 2024-08-26 NOTE — Telephone Encounter (Signed)
°  FYI Only or Action Required?: Action required by provider: clinical question for provider, update on patient condition, and medication request and ER refusal.  Patient was last seen in primary care on 05/20/2024 by Antonio Meth, Jamee SAUNDERS, DO.  Called Nurse Triage reporting Palpitations.  Symptoms began several days ago.  Interventions attempted: Rest, hydration, or home remedies.  Symptoms are: gradually worsening.  Triage Disposition: Go to ED Now (Notify PCP)  Patient/caregiver understands and will follow disposition?: No, wishes to speak with PCP            Copied from CRM #[id number]. Topic: Clinical - Red Word Triage >> Aug 26, 2024  1:28 PM Mesmerise C wrote: [facility] that prompted transfer to Nurse Triage: Patient has been having anxiety and headaches due to not having pregabalin  (LYRICA ) 100 MG capsule her neurogolist won't fill the medication until she has an appt but not availabile until January and was told to contact her pcp Reason for Disposition  Difficulty breathing  Answer Assessment - Initial Assessment Questions Patient called for a refill of a medication usually filled by her neurologist  pregabalin  (LYRICA ) 100 MG capsule  --advised they couldn't refill it until she was seen again and they're next appt is in January 21st with Dr Leigh --patient states she is completely out of the medication  Anxiety and bad headaches --having chest palpitations off and on for two days --heart rate running close to 100 intermittently Patient states some dizziness when she stands up sometimes Patient always on 4 liters via nasal cannula  Patient states that her chest feels tight She states there is a discomfort Patient also states that she has been having off and on shortness of breath that has worsened---for the past week  Patient is advised that the recommendation is that she goes to the ER for further evaluation Patient states that she has been to the hospital multiple  times in the past six months and doesn't want to go at this time This RN offered to call an ambulance for her since transportation was an issue but patient declined at this time   Patient states that she believes her symptoms are related to anxiety She states that she started having these symptoms when she ran out of pregabalin  She states that is the only thing that is different  Patient is advised to call us  back if anything changes or with any further questions/concerns. Patient is advised that if anything worsens to call 911. Patient verbalized understanding.  Protocols used: Chest Pain-A-AH

## 2024-08-26 NOTE — Progress Notes (Signed)
 Pharmacy Quality Measure Review  This patient is appearing on a report for being at risk of failing the adherence measure for cholesterol (statin) and diabetes medications this calendar year.   Medication: rosuvastatin   Last fill date: 05/01/2024 for 90 day supply per adherence report  Reviewed recent refill history in Dr Annemarie database. Actual last refill date was 08/01/2024 for 90 day supply. Patient has no refills remaining. Next appointment with PCP is not currently scheduled. Patient is past due to have labs rechecked and follow up with Dr Cruz.    Medication: Trulicity  Last fill date: 01/11/2024 for 84 day supply  Left voicemail for patient to return my call at their convenience. Reminded patient that she is due follow up with Dr Cruz and left office number to call [phone]  Madelin Ray, PharmD Clinical Pharmacist [facility]

## 2024-08-26 NOTE — Telephone Encounter (Signed)
 Pt cld for refill request, per notes I explained she must be seen in office to have controlled Rx released and if she does not fell good or that she can wait, to call ED or PCP for help. She is already sched for appt with Hill in January and is on the [facility]

## 2024-08-26 NOTE — Telephone Encounter (Signed)
 Called CAL and advised Kirke of ER Refusal

## 2024-08-26 NOTE — Telephone Encounter (Signed)
 Tried calling patient back,  Per Dr.Hill he highly suggest patient to go to the ED for the chest pain , Shortness of breathe and the anxiety or to call her PCP office to discuss. Patient sees him for Neuropathy and Raymund will not help with the symptoms she is having.    Patient no showed her last visit she will need to be seen face to face visit before he will fill a control medication.

## 2024-08-27 NOTE — Telephone Encounter (Signed)
 Spoke w/ Pt- informed that PCP did send a prescription for Lyrica .

## 2024-08-28 ENCOUNTER — Other Ambulatory Visit: Payer: Self-pay | Admitting: Adult Health

## 2024-08-28 DIAGNOSIS — F411 Generalized anxiety disorder: Secondary | ICD-10-CM

## 2024-08-28 DIAGNOSIS — F5105 Insomnia due to other mental disorder: Secondary | ICD-10-CM

## 2024-08-30 ENCOUNTER — Other Ambulatory Visit: Payer: Self-pay | Admitting: Family Medicine

## 2024-08-30 ENCOUNTER — Encounter: Payer: Self-pay | Admitting: Neurology

## 2024-08-30 ENCOUNTER — Ambulatory Visit: Admitting: Neurology

## 2024-08-30 VITALS — BP 121/80 | HR 93 | Ht 63.0 in | Wt 244.0 lb

## 2024-08-30 DIAGNOSIS — R269 Unspecified abnormalities of gait and mobility: Secondary | ICD-10-CM | POA: Diagnosis not present

## 2024-08-30 DIAGNOSIS — E1142 Type 2 diabetes mellitus with diabetic polyneuropathy: Secondary | ICD-10-CM | POA: Diagnosis not present

## 2024-08-30 MED ORDER — PREGABALIN 100 MG PO CAPS: 100.0000 mg | ORAL_CAPSULE | Freq: Two times a day (BID) | ORAL | 5 refills | Status: AC

## 2024-08-30 NOTE — Patient Instructions (Signed)
 I have refilled your Lyrica . You will continue to take 100 mg twice daily. I can continue to refill as long as I see you at least once per year.  Good control of diabetes will help reduce the risk of neuropathy worsening.  It is important to take good care of your feet and inspect them daily. If you see anything concerning, you should get it checked out as you may not feel injuries to your foot normally.  I will see you again in 1 year or sooner if needed.  Please let me know if you have any questions or concerns in the meantime.  The physicians and staff at [facility] are committed to providing excellent care. You may receive a survey requesting feedback about your experience at our office. We strive to receive very good responses to the survey questions. If you feel that your experience would prevent you from giving the office a very good  response, please contact our office to try to remedy the situation. We may be reached at [phone]. Thank you for taking the time out of your busy day to complete the survey.  Venetia Potters, MD [facility]  Preventing Falls at [facility] are common, often dreaded events in the lives of older people. Aside from the obvious injuries and even death that may result, fall can cause wide-ranging consequences including loss of independence, mental decline, decreased activity and mobility. Younger people are also at risk of falling, especially those with chronic illnesses and fatigue.  Ways to reduce risk for falling Examine diet and medications. Warm foods and alcohol  dilate blood vessels, which can lead to dizziness when standing. Sleep aids, antidepressants and pain medications can also increase the likelihood of a fall.  Get a vision exam. Poor vision, cataracts and glaucoma increase the chances of falling.  Check foot gear. Shoes should fit snugly and have a sturdy, nonskid sole and a broad, low heel  Participate in a physician-approved  exercise program to build and maintain muscle strength and improve balance and coordination. Programs that use ankle weights or stretch bands are excellent for muscle-strengthening. Water aerobics programs and low-impact Tai Chi programs have also been shown to improve balance and coordination.  Increase vitamin D  intake. Vitamin D  improves muscle strength and increases the amount of calcium  the body is able to absorb and deposit in bones.  How to prevent falls from common hazards Floors - Remove all loose wires, cords, and throw rugs. Minimize clutter. Make sure rugs are anchored and smooth. Keep furniture in its usual place.  Chairs -- Use chairs with straight backs, armrests and firm seats. Add firm cushions to existing pieces to add height.  Bathroom - Install grab bars and non-skid tape in the tub or shower. Use a bathtub transfer bench or a shower chair with a back support Use an elevated toilet seat and/or safety rails to assist standing from a low surface. Do not use towel racks or bathroom tissue holders to help you stand.  Lighting - Make sure halls, stairways, and entrances are well-lit. Install a night light in your bathroom or hallway. Make sure there is a light switch at the top and bottom of the staircase. Turn lights on if you get up in the middle of the night. Make sure lamps or light switches are within reach of the bed if you have to get up during the night.  Kitchen - Install non-skid rubber mats near the sink and stove. Clean spills immediately. Store  frequently used utensils, pots, pans between waist and eye level. This helps prevent reaching and bending. Sit when getting things out of lower cupboards.  Living room/ Bedrooms - Place furniture with wide spaces in between, giving enough room to move around. Establish a route through the living room that gives you something to hold onto as you walk.  Stairs - Make sure treads, rails, and rugs are secure. Install a rail on both  sides of the stairs. If stairs are a threat, it might be helpful to arrange most of your activities on the lower level to reduce the number of times you must climb the stairs.  Entrances and doorways - Install metal handles on the walls adjacent to the doorknobs of all doors to make it more secure as you travel through the doorway.  Tips for maintaining balance Keep at least one hand free at all times. Try using a backpack or fanny pack to hold things rather than carrying them in your hands. Never carry objects in both hands when walking as this interferes with keeping your balance.  Attempt to swing both arms from front to back while walking. This might require a conscious effort if Parkinson's disease has diminished your movement. It will, however, help you to maintain balance and posture, and reduce fatigue.  Consciously lift your feet off of the ground when walking. Shuffling and dragging of the feet is a common culprit in losing your balance.  When trying to navigate turns, use a U technique of facing forward and making a wide turn, rather than pivoting sharply.  Try to stand with your feet shoulder-length apart. When your feet are close together for any length of time, you increase your risk of losing your balance and falling.  Do one thing at a time. Don't try to walk and accomplish another task, such as reading or looking around. The decrease in your automatic reflexes complicates motor function, so the less distraction, the better.  Do not wear rubber or gripping soled shoes, they might catch on the floor and cause tripping.  Move slowly when changing positions. Use deliberate, concentrated movements and, if needed, use a grab bar or walking aid. Count 15 seconds between each movement. For example, when rising from a seated position, wait 15 seconds after standing to begin walking.  If balance is a continuous problem, you might want to consider a walking aid such as a cane, walking  stick, or walker. Once you've mastered walking with help, you might be ready to try it on your own again.

## 2024-09-10 ENCOUNTER — Ambulatory Visit: Admitting: Family Medicine

## 2024-09-20 ENCOUNTER — Ambulatory Visit: Admitting: Family Medicine

## 2024-09-20 ENCOUNTER — Encounter: Payer: Self-pay | Admitting: Family Medicine

## 2024-09-20 VITALS — BP 134/86 | HR 78 | Temp 98.3°F | Resp 16 | Ht 63.0 in | Wt 252.2 lb

## 2024-09-20 DIAGNOSIS — Z20828 Contact with and (suspected) exposure to other viral communicable diseases: Secondary | ICD-10-CM | POA: Insufficient documentation

## 2024-09-20 DIAGNOSIS — N1832 Chronic kidney disease, stage 3b: Secondary | ICD-10-CM

## 2024-09-20 DIAGNOSIS — E1122 Type 2 diabetes mellitus with diabetic chronic kidney disease: Secondary | ICD-10-CM

## 2024-09-20 DIAGNOSIS — E1169 Type 2 diabetes mellitus with other specified complication: Secondary | ICD-10-CM

## 2024-09-20 DIAGNOSIS — J9611 Chronic respiratory failure with hypoxia: Secondary | ICD-10-CM | POA: Diagnosis not present

## 2024-09-20 DIAGNOSIS — R21 Rash and other nonspecific skin eruption: Secondary | ICD-10-CM

## 2024-09-20 DIAGNOSIS — I10 Essential (primary) hypertension: Secondary | ICD-10-CM

## 2024-09-20 DIAGNOSIS — Z794 Long term (current) use of insulin: Secondary | ICD-10-CM

## 2024-09-20 DIAGNOSIS — E785 Hyperlipidemia, unspecified: Secondary | ICD-10-CM

## 2024-09-20 DIAGNOSIS — J439 Emphysema, unspecified: Secondary | ICD-10-CM

## 2024-09-20 MED ORDER — ACCU-CHEK SOFTCLIX LANCETS MISC: 2 refills | Status: DC

## 2024-09-20 MED ORDER — CLOTRIMAZOLE-BETAMETHASONE 1-0.05 % EX CREA: 1.0000 | TOPICAL_CREAM | Freq: Every day | CUTANEOUS | 3 refills | Status: AC

## 2024-09-20 MED ORDER — TOUJEO MAX SOLOSTAR 300 UNIT/ML ~~LOC~~ SOPN: 40.0000 [IU] | PEN_INJECTOR | Freq: Every day | SUBCUTANEOUS | 3 refills | Status: AC

## 2024-09-20 MED ORDER — OSELTAMIVIR PHOSPHATE 75 MG PO CAPS: 75.0000 mg | ORAL_CAPSULE | Freq: Two times a day (BID) | ORAL | 0 refills | Status: AC

## 2024-09-20 NOTE — Assessment & Plan Note (Signed)
 Her son was dx with flu Pt started symptoms yesterday Tamiflu  sent in  Return to office or call if symptoms do not improve-- or go to Er if symptoms worsen

## 2024-09-20 NOTE — Assessment & Plan Note (Signed)
 Check labs

## 2024-09-20 NOTE — Progress Notes (Signed)
 "  Subjective:    Patient ID: Sue [name], female    DOB: 1950-12-02, 74 y.o.   MRN: [id number]  Chief Complaint  Patient presents with   Medical Management of Chronic Issues    Patient presents today for a follow-up.    HPI Patient is in today for f/u but was also exposed to the flu.  Her son tested positive and she started feeling bad yesterday.   Discussed the use of AI scribe software for clinical note transcription with the patient, who gave verbal consent to proceed.  History of Present Illness Sue [name] is a 73 year old female who presents with joint and muscle aches, dizziness, and painful skin lesions.  She began experiencing aching in all her joints and muscles yesterday, accompanied by dizziness. She has been using Tylenol  for relief. No chest pain, trouble breathing, or fever.  She has painful skin lesions that started about a week ago on one side of her body, described as painful with a burning sensation. Some lesions have scabbed over and are healing. She has been using A and G ointment for the lesions.  Her son recently had the flu, and she suspects she might have contracted it from him. She feels 'rickety' and notes knee pain. No congestion or cough.  She uses oxygen  and has an Accu-Check at home. She takes Toujeo  40 units daily and uses NovoLog . She also needs lancets for glucose monitoring.    Past Medical History:  Diagnosis Date   Allergic rhinitis    Depression    Diabetes mellitus type 2, uncontrolled    Emphysema of lung (HCC)    3L home O2   GERD (gastroesophageal reflux disease)    Hypertension    Hypothyroidism    Obesity, morbid, BMI 50 or higher (HCC)    Stroke (HCC) 2016   TIA    Urine incontinence     Past Surgical History:  Procedure Laterality Date   ABDOMINAL HYSTERECTOMY     CESAREAN SECTION     COLONOSCOPY N/A 08/19/2019   Procedure: COLONOSCOPY;  Surgeon: Rosalie Kitchens, MD;  Location: [facility];  Service: Endoscopy;   Laterality: N/A;   COLONOSCOPY WITH PROPOFOL  N/A 08/05/2019   Procedure: COLONOSCOPY WITH PROPOFOL ;  Surgeon: Dianna Specking, MD;  Location: [facility];  Service: Gastroenterology;  Laterality: N/A;   COLONOSCOPY WITH PROPOFOL  N/A 11/19/2019   Procedure: COLONOSCOPY WITH PROPOFOL ;  Surgeon: Donnald Charleston, MD;  Location: [facility];  Service: Endoscopy;  Laterality: N/A;  Unprepped   COLONOSCOPY WITH PROPOFOL  N/A 11/22/2019   Procedure: COLONOSCOPY WITH PROPOFOL ;  Surgeon: Donnald Charleston, MD;  [facility]: [facility];  Service: Endoscopy;  Laterality: N/A;   COLONOSCOPY WITH PROPOFOL  N/A 11/23/2019   Procedure: COLONOSCOPY WITH PROPOFOL ;  Surgeon: Saintclair Jasper, MD;  [facility];  Service: Gastroenterology;  Laterality: N/A;   COLONOSCOPY WITH PROPOFOL  N/A 11/29/2019   Procedure: COLONOSCOPY WITH PROPOFOL ;  Surgeon: Dianna Specking, MD;  [facility];  Service: Endoscopy;  Laterality: N/A;   ENTEROSCOPY N/A 11/24/2019   Procedure: ENTEROSCOPY;  Surgeon: Saintclair Jasper, MD;  [facility];  Service: Gastroenterology;  Laterality: N/A;   ENTEROSCOPY N/A 11/27/2019   Procedure: ENTEROSCOPY;  Surgeon: Dianna Specking, MD;  [facility];  Service: Endoscopy;  Laterality: N/A;   ESOPHAGOGASTRODUODENOSCOPY N/A 11/27/2019   Procedure: ESOPHAGOGASTRODUODENOSCOPY (EGD);  Surgeon: Dianna Specking, MD;  Location: [facility];  Service: Endoscopy;  Laterality: N/A;   ESOPHAGOGASTRODUODENOSCOPY (EGD) WITH PROPOFOL  N/A 11/24/2019   Procedure: ESOPHAGOGASTRODUODENOSCOPY (  EGD) WITH PROPOFOL ;  Surgeon: Saintclair Jasper, MD;  [facility];  Service: Gastroenterology;  Laterality: N/A;  PUSH enteroscopy   GIVENS CAPSULE STUDY N/A 11/19/2019   Procedure: GIVENS CAPSULE STUDY;  Surgeon: Donnald Charleston, MD;  [facility];  Service: Endoscopy;  Laterality: N/A;  To be performed immediately following colonoscopy   GIVENS CAPSULE STUDY N/A 11/24/2019   Procedure:  GIVENS CAPSULE STUDY;  Surgeon: Saintclair Jasper, MD;  [facility];  Service: Gastroenterology;  Laterality: N/A;   GIVENS CAPSULE STUDY N/A 11/28/2019   Procedure: GIVENS CAPSULE STUDY;  Surgeon: Dianna Specking, MD;  [facility];  Service: Endoscopy;  Laterality: N/A;   HEMOSTASIS CLIP PLACEMENT  11/19/2019   Procedure: HEMOSTASIS CLIP PLACEMENT;  Surgeon: Donnald Charleston, MD;  [facility];  Service: Endoscopy;;   HEMOSTASIS CLIP PLACEMENT  11/22/2019   Procedure: HEMOSTASIS CLIP PLACEMENT;  Surgeon: Donnald Charleston, MD;  [facility];  Service: Endoscopy;;   HOT HEMOSTASIS N/A 11/24/2019   Procedure: HOT HEMOSTASIS (ARGON PLASMA COAGULATION/BICAP);  Surgeon: Saintclair Jasper, MD;  Location: [facility];  Service: Gastroenterology;  Laterality: N/A;   HOT HEMOSTASIS N/A 11/27/2019   Procedure: HOT HEMOSTASIS (ARGON PLASMA COAGULATION/BICAP);  Surgeon: Dianna Specking, MD;  Location: [facility];  Service: Endoscopy;  Laterality: N/A;   SUBMUCOSAL TATTOO INJECTION  11/19/2019   Procedure: SUBMUCOSAL TATTOO INJECTION;  Surgeon: Donnald Charleston, MD;  [facility];  Service: Endoscopy;;    Family History  Problem Relation Age of Onset   Heart disease Father        MVP and Pics Valve   Hypertension Father    Depression Father        Institutionalized x's 2 years   Bipolar disorder Father    Hypertension Sister    Diabetes Sister    Hyperlipidemia Sister    Heart disease Sister 53       MI   Heart disease Brother    Hypertension Brother    Schizophrenia Paternal Aunt    Depression Paternal Aunt    Anxiety disorder Paternal Aunt    Heart disease Paternal Aunt    Schizophrenia Paternal Aunt    Heart disease Paternal Uncle    Heart disease Paternal Grandmother    Asthma Son    Asthma Son     Social History   Socioeconomic History   Marital status: Divorced    Spouse name: Not on file   Number of children: 4   Years of education: 14    Highest education level: Some college, no degree  Occupational History   Occupation: disabled- Biochemist, Clinical Med  Tobacco Use   Smoking status: Former    Current packs/day: 0.00    Average packs/day: 1 pack/day for 40.0 years (40.0 ttl pk-yrs)    Types: Cigarettes    Start date: 07/21/1972    Quit date: 10/16/2011    Years since quitting: 12.9   Smokeless tobacco: Never  Vaping Use   Vaping status: Never Used  Substance and Sexual Activity   Alcohol  use: Not Currently    Comment: Occ-- Wine   Drug use: No   Sexual activity: Not Currently  Other Topics Concern   Not on file  Social History Narrative      Originally from [place]. Has also lived in [place], [place]. She also previously lived in [place] for 20 years. No history of Valley Fever. Moved to [place] in 1989. 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 No pets currently. No bird exposure. Questionable previous mold exposure in her daughter's home. 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       Lives in [place] alone in assisted living.  Has multiple chronic illness.      Are you right handed or left handed? Right handed   Are you currently employed ? no   What is your current occupation? retired   Do you live at home alone?no   Who lives with you? son   What type of home do you live in: 1 story or 2 story? Apartment first floor       Social Drivers of Health   Tobacco Use: Medium Risk (09/20/2024)   Patient History    Smoking Tobacco Use: Former    Smokeless Tobacco Use: Never    Passive Exposure: Not on file  Financial Resource Strain: Low Risk (08/19/2024)   Overall Financial Resource Strain (CARDIA)    Difficulty of Paying Living Expenses: Not very hard  Food Insecurity: No Food Insecurity (08/19/2024)    Epic    Worried About Programme Researcher, Broadcasting/film/video in the Last Year: Never true    Ran Out of Food in the Last Year: Never true  Transportation Needs: No Transportation Needs (08/19/2024)   Epic    Lack of Transportation (Medical): No    Lack of Transportation (Non-Medical): No  Physical Activity: Inactive (08/19/2024)   Exercise Vital Sign    Days of Exercise per Week: 0 days    Minutes of Exercise per Session: Not on file  Stress: No Stress Concern Present (08/19/2024)   [employer] of Occupational Health - Occupational Stress Questionnaire    Feeling of Stress: Only a little  Social Connections: Socially Isolated (08/19/2024)   Social Connection and Isolation Panel    Frequency of Communication with Friends and Family: More than three times a week    Frequency of Social Gatherings with Friends and Family: Once a week    Attends Religious Services: Never    Database Administrator or Organizations: No    Attends Engineer, Structural: Not on file    Marital Status: Divorced  Intimate Partner Violence: Not At Risk (04/23/2024)   Epic    Fear of Current or Ex-Partner: No    Emotionally Abused: No    Physically Abused: No    Sexually Abused: No  Depression (PHQ2-9): High Risk (09/20/2024)   Depression (PHQ2-9)    PHQ-2 Score: 14  Alcohol  Screen: Low Risk (04/23/2024)   Alcohol  Screen    Last Alcohol  Screening Score (AUDIT): 0  Housing: Low Risk (08/19/2024)   Epic    Unable to Pay for Housing in the Last Year: No    Number of Times Moved in the Last Year: 0    Homeless in the Last Year: No  Utilities: Not At Risk (04/23/2024)   Epic    Threatened with loss of utilities: No  Health Literacy: Adequate Health Literacy (04/23/2024)   B1300 Health Literacy    Frequency of need for help with medical instructions: Never    Outpatient Medications Prior to Visit  Medication Sig Dispense Refill   aspirin  EC 81 MG tablet Take 1 tablet (81 mg total) by mouth daily. Swallow whole. 30  tablet 12   Blood Glucose Monitoring  Suppl (ACCU-CHEK GUIDE) w/Device KIT USE   TO CHECK GLUCOSE UP TO 4 TIMES DAILY AS DIRECTED 1 kit 0   busPIRone  (BUSPAR ) 15 MG tablet Take 1 tablet (15 mg total) by mouth 2 (two) times daily. 60 tablet 3   Dulaglutide  (TRULICITY ) 3 MG/0.5ML SOAJ Inject 3 mg into the skin once a week. 6 mL 3   glucose blood (ACCU-CHEK GUIDE TEST) test strip USE 1  4 TIMES DAILY 200 each 12   insulin  aspart (NOVOLOG  FLEXPEN) 100 UNIT/ML FlexPen Novolog  10 units with each meal PLUS the scale if needed -Novolog  correctional insulin : ADD extra units on insulin  to your meal-time Novolog  dose if your blood sugars are higher than 160. Use the scale below to help guide you:  Blood sugar before meal Number of units to inject Less than 160 0 unit 161 -  190 1 units 191 -  220 2 units 221 -  250 3 units 251 -  280 4 units 281 -  310 5 units 311 -  340 6 units 341 -  370 7 units 371 -  400 8 units Max daily 45 units 45 mL 3   Insulin  Pen Needle (BD PEN NEEDLE NANO U/F) 32G X 4 MM MISC Use to inject insulin  200 each 3   levothyroxine  (SYNTHROID ) 100 MCG tablet Take 1 tablet (100 mcg total) by mouth daily before breakfast. 90 tablet 3   lidocaine  (LIDODERM ) 5 % Apply 1 patch to area of pain for up to 12 hours. Remove & Discard patch within 12 hours. May repeat once daily. 30 patch 0   LORazepam  (ATIVAN ) 1 MG tablet TAKE 1/2 TO 1 AND 1/2 TABLETS AT BEDTIME AND AN ADDITIONAL 1/2 TAB ONCE A DAY AS NEEDED FOR ANXIETY 60 tablet 0   metoprolol  succinate (TOPROL -XL) 100 MG 24 hr tablet Take 1 tablet (100 mg total) by mouth daily. Take with or immediately following a meal. 90 tablet 3   NONFORMULARY OR COMPOUNDED ITEM Beside commode #1  frequent falls, weakness 1 each 0   NONFORMULARY OR COMPOUNDED ITEM oxygen  concentrator for oxygen  1 each 0   OXYGEN  Inhale 3-4 L/min into the lungs continuous.      pantoprazole  (PROTONIX ) 40 MG tablet Take 1 tablet (40 mg total) by mouth 2 (two) times daily. 60 tablet 1    potassium chloride  SA (KLOR-CON  M) 20 MEQ tablet Take 2 tablets (40 mEq total) by mouth daily. 180 tablet 0   pregabalin  (LYRICA ) 100 MG capsule Take 1 capsule (100 mg total) by mouth 2 (two) times daily. 60 capsule 5   QUEtiapine  (SEROQUEL ) 200 MG tablet TAKE 1 TABLET BY MOUTH AT BEDTIME. 90 tablet 1   rosuvastatin  (CRESTOR ) 10 MG tablet Take 1 tablet by mouth once daily 90 tablet 0   senna-docusate (SENOKOT-S) 8.6-50 MG tablet Take 1 tablet by mouth daily. 30 tablet 0   torsemide  (DEMADEX ) 20 MG tablet Take 2 tablets (40 mg total) by mouth every evening. 180 tablet 0   zolpidem  (AMBIEN  CR) 12.5 MG CR tablet TAKE 1 TABLET BY MOUTH EVERY DAY AT BEDTIME AS NEEDED FOR SLEEP 30 tablet 0   Accu-Chek Softclix Lancets lancets USE 1  TO CHECK GLUCOSE 4 TIMES DAILY 100 each 2   TOUJEO  MAX SOLOSTAR 300 UNIT/ML Solostar Pen INJECT 40 UNITS SUBCUTANEOUSLY AT BEDTIME 6 mL 0   No facility-administered medications prior to visit.    Allergies  Allergen Reactions   Hydrocodone  Other (See Comments)    Constipation and  hallucinations   Norvasc  [Amlodipine  Besylate] Swelling and Other (See Comments)    Marked swelling of the limbs   Tizanidine  Other (See Comments)    After the 3rd dose, the patient's mouth began to feel numb and she felt like she was a having a hot flash    Review of Systems  Constitutional:  Positive for chills and malaise/fatigue. Negative for fever.  HENT:  Negative for congestion and hearing loss.   Eyes:  Negative for discharge.  Respiratory:  Negative for cough, sputum production and shortness of breath.   Cardiovascular:  Negative for chest pain, palpitations and leg swelling.  Gastrointestinal:  Negative for abdominal pain, blood in stool, constipation, diarrhea, heartburn, nausea and vomiting.  Genitourinary:  Negative for dysuria, frequency, hematuria and urgency.  Musculoskeletal:  Negative for back pain, falls and myalgias.  Skin:  Negative for rash.  Neurological:   Negative for dizziness, sensory change, loss of consciousness, weakness and headaches.  Endo/Heme/Allergies:  Negative for environmental allergies. Does not bruise/bleed easily.  Psychiatric/Behavioral:  Negative for depression and suicidal ideas. The patient is not nervous/anxious and does not have insomnia.        Objective:    Physical Exam Vitals and nursing note reviewed.  Constitutional:      General: She is not in acute distress.    Appearance: Normal appearance. She is well-developed. She is ill-appearing.  HENT:     Head: Normocephalic and atraumatic.     Right Ear: Tympanic membrane, ear canal and external ear normal. There is no impacted cerumen.     Left Ear: Tympanic membrane, ear canal and external ear normal. There is no impacted cerumen.     Nose: Nose normal.     Mouth/Throat:     Mouth: Mucous membranes are moist.     Pharynx: Oropharynx is clear. No oropharyngeal exudate or posterior oropharyngeal erythema.  Eyes:     General: No scleral icterus.       Right eye: No discharge.        Left eye: No discharge.     Conjunctiva/sclera: Conjunctivae normal.     Pupils: Pupils are equal, round, and reactive to light.  Neck:     Thyroid : No thyromegaly or thyroid  tenderness.     Vascular: No JVD.  Cardiovascular:     Rate and Rhythm: Normal rate and regular rhythm.     Heart sounds: Normal heart sounds. No murmur heard. Pulmonary:     Effort: Pulmonary effort is normal. No respiratory distress.     Breath sounds: Normal breath sounds.  Abdominal:     General: Bowel sounds are normal. There is no distension.     Palpations: Abdomen is soft. There is no mass.     Tenderness: There is no abdominal tenderness. There is no guarding or rebound.  Genitourinary:    Vagina: Normal.  Musculoskeletal:        General: Normal range of motion.     Cervical back: Normal range of motion and neck supple.     Right lower leg: No edema.     Left lower leg: No edema.   Lymphadenopathy:     Cervical: No cervical adenopathy.  Skin:    General: Skin is warm and dry.     Findings: No erythema or rash.  Neurological:     Mental Status: She is alert and oriented to person, place, and time.     Cranial Nerves: No cranial nerve deficit.     Deep Tendon  Reflexes: Reflexes are normal and symmetric.  Psychiatric:        Mood and Affect: Mood normal.        Behavior: Behavior normal.        Thought Content: Thought content normal.        Judgment: Judgment normal.     BP 134/86   Pulse 78   Temp 98.3 F (36.8 C)   Resp 16   Ht 5' 3 (1.6 m)   Wt 252 lb 3.2 oz (114.4 kg)   SpO2 98% Comment: w/ 4 liters of o2  BMI 44.68 kg/m  Wt Readings from Last 3 Encounters:  09/20/24 252 lb 3.2 oz (114.4 kg)  08/30/24 244 lb (110.7 kg)  04/30/24 248 lb 12.8 oz (112.9 kg)    Diabetic Foot Exam - Simple   No data filed    Lab Results  Component Value Date   WBC 5.4 02/06/2024   HGB 12.1 02/06/2024   HCT 39.0 02/06/2024   PLT 176 02/06/2024   GLUCOSE 116 (H) 02/06/2024   CHOL 86 11/07/2023   TRIG 88.0 11/07/2023   HDL 35.50 (L) 11/07/2023   LDLCALC 33 11/07/2023   ALT 18 12/31/2023   AST 40 12/31/2023   NA 143 02/06/2024   K 3.3 (L) 02/06/2024   CL 97 02/06/2024   CREATININE 1.70 (H) 02/06/2024   BUN 12 02/06/2024   CO2 28 02/06/2024   TSH 3.26 11/07/2023   INR 1.2 09/01/2022   HGBA1C 10.3 (H) 11/07/2023   MICROALBUR <0.7 11/07/2023    Lab Results  Component Value Date   TSH 3.26 11/07/2023   Lab Results  Component Value Date   WBC 5.4 02/06/2024   HGB 12.1 02/06/2024   HCT 39.0 02/06/2024   MCV 98 (H) 02/06/2024   PLT 176 02/06/2024   Lab Results  Component Value Date   NA 143 02/06/2024   K 3.3 (L) 02/06/2024   CO2 28 02/06/2024   GLUCOSE 116 (H) 02/06/2024   BUN 12 02/06/2024   CREATININE 1.70 (H) 02/06/2024   BILITOT 1.0 12/31/2023   ALKPHOS 107 12/31/2023   AST 40 12/31/2023   ALT 18 12/31/2023   PROT 8.6 (H)  12/31/2023   ALBUMIN  2.8 (L) 12/31/2023   CALCIUM  8.6 (L) 02/06/2024   ANIONGAP 7 01/08/2024   EGFR 32 (L) 02/06/2024   GFR 26.11 (L) 11/07/2023   Lab Results  Component Value Date   CHOL 86 11/07/2023   Lab Results  Component Value Date   HDL 35.50 (L) 11/07/2023   Lab Results  Component Value Date   LDLCALC 33 11/07/2023   Lab Results  Component Value Date   TRIG 88.0 11/07/2023   Lab Results  Component Value Date   CHOLHDL 2 11/07/2023   Lab Results  Component Value Date   HGBA1C 10.3 (H) 11/07/2023       Assessment & Plan:  Exposure to the flu Assessment & Plan: Her son was dx with flu Pt started symptoms yesterday Tamiflu  sent in  Return to office or call if symptoms do not improve-- or go to Er if symptoms worsen  Orders: -     Oseltamivir  Phosphate; Take 1 capsule (75 mg total) by mouth 2 (two) times daily.  Dispense: 10 capsule; Refill: 0 -     CBC with Differential/Platelet -     Comprehensive metabolic panel with GFR  Rash -     Clotrimazole -Betamethasone ; Apply 1 Application topically daily.  Dispense: 30 g; Refill:  3  Type 2 diabetes mellitus with stage 3b chronic kidney disease, unspecified whether long term insulin  use (HCC) -     Accu-Chek Softclix Lancets; Use as instructed  Dispense: 100 each; Refill: 2 -     Toujeo  Max SoloStar; Inject 40 Units into the skin daily.  Dispense: 6 mL; Refill: 3 -     CBC with Differential/Platelet -     Comprehensive metabolic panel with GFR -     Lipid panel -     TSH  Hyperlipidemia associated with type 2 diabetes mellitus (HCC) Assessment & Plan: Encourage heart healthy diet such as MIND or DASH diet, increase exercise, avoid trans fats, simple carbohydrates and processed foods, consider a krill or fish or flaxseed oil cap daily.    Orders: -     Comprehensive metabolic panel with GFR -     Lipid panel  Essential hypertension -     CBC with Differential/Platelet -     TSH  Type 2 diabetes mellitus  with stage 3b chronic kidney disease, with long-term current use of insulin  (HCC) Assessment & Plan: Check labs    Pulmonary emphysema [facility] Assessment & Plan: Per pulm   Chronic respiratory failure with hypoxia (HCC) Assessment & Plan: On O2 Per pulm   Assessment and Plan Assessment & Plan Suspected influenza infection   Symptoms consistent with influenza, including myalgia, arthralgia, and dizziness, began yesterday. No fever, chest pain, or dyspnea. Her son was recently diagnosed with influenza. Due to high demand, no influenza test is available. She will be treated empirically for influenza based on clinical presentation and exposure history. Prescribed Tamiflu  to start as soon as possible. Advised to use acetaminophen  for myalgia and arthralgia. Instructed to monitor oxygen  saturation; if it drops below 95% and remains low, go to the emergency room. Advised to seek medical attention if symptoms worsen, such as coughing or congestion, which may indicate the need for antibiotics.  Painful cutaneous eruption   A painful cutaneous eruption with burning pain was initially suspected to be shingles. Lesions are present but not typical for shingles as they are not unilateral. Lesions are healing with scabs present. She will be treated with a topical cream for symptomatic relief. Advised to report if the condition does not improve or worsens.  General health maintenance   Routine health maintenance was discussed, including medication refills and lab work. Blood work was ordered, and medication refills were sent to the pharmacy.    Makenley Shimp R Lowne Chase, DO  "

## 2024-09-20 NOTE — Assessment & Plan Note (Signed)
 Encourage heart healthy diet such as MIND or DASH diet, increase exercise, avoid trans fats, simple carbohydrates and processed foods, consider a krill or fish or flaxseed oil cap daily.

## 2024-09-20 NOTE — Assessment & Plan Note (Signed)
Per pulm 

## 2024-09-20 NOTE — Assessment & Plan Note (Signed)
On O2   Per pulm 

## 2024-09-21 LAB — CBC WITH DIFFERENTIAL/PLATELET
Absolute Lymphocytes: 1986 {cells}/uL (ref 850–3900)
Absolute Monocytes: 402 {cells}/uL (ref 200–950)
Basophils Absolute: 28 {cells}/uL (ref 0–200)
Basophils Relative: 0.5 %
Eosinophils Absolute: 242 {cells}/uL (ref 15–500)
Eosinophils Relative: 4.4 %
HCT: 35.1 % — ABNORMAL LOW (ref 35.9–46.0)
Hemoglobin: 11 g/dL — ABNORMAL LOW (ref 11.7–15.5)
MCH: 30.8 pg (ref 27.0–33.0)
MCHC: 31.3 g/dL — ABNORMAL LOW (ref 31.6–35.4)
MCV: 98.3 fL (ref 81.4–101.7)
MPV: 10.9 fL (ref 7.5–12.5)
Monocytes Relative: 7.3 %
Neutro Abs: 2844 {cells}/uL (ref 1500–7800)
Neutrophils Relative %: 51.7 %
Platelets: 183 Thousand/uL (ref 140–400)
RBC: 3.57 Million/uL — ABNORMAL LOW (ref 3.80–5.10)
RDW: 15 % (ref 11.0–15.0)
Total Lymphocyte: 36.1 %
WBC: 5.5 Thousand/uL (ref 3.8–10.8)

## 2024-09-21 LAB — COMPREHENSIVE METABOLIC PANEL WITH GFR
AG Ratio: 0.8 (calc) — ABNORMAL LOW (ref 1.0–2.5)
ALT: 12 U/L (ref 6–29)
AST: 24 U/L (ref 10–35)
Albumin: 3.4 g/dL — ABNORMAL LOW (ref 3.6–5.1)
Alkaline phosphatase (APISO): 120 U/L (ref 37–153)
BUN/Creatinine Ratio: 12 (calc) (ref 6–22)
BUN: 19 mg/dL (ref 7–25)
CO2: 31 mmol/L (ref 20–32)
Calcium: 8.6 mg/dL (ref 8.6–10.4)
Chloride: 100 mmol/L (ref 98–110)
Creat: 1.65 mg/dL — ABNORMAL HIGH (ref 0.60–1.00)
Globulin: 4.3 g/dL — ABNORMAL HIGH (ref 1.9–3.7)
Glucose, Bld: 187 mg/dL — ABNORMAL HIGH (ref 65–99)
Potassium: 3.4 mmol/L — ABNORMAL LOW (ref 3.5–5.3)
Sodium: 139 mmol/L (ref 135–146)
Total Bilirubin: 0.4 mg/dL (ref 0.2–1.2)
Total Protein: 7.7 g/dL (ref 6.1–8.1)
eGFR: 33 mL/min/1.73m2 — ABNORMAL LOW

## 2024-09-21 LAB — LIPID PANEL
Cholesterol: 135 mg/dL
HDL: 38 mg/dL — ABNORMAL LOW
LDL Cholesterol (Calc): 70 mg/dL
Non-HDL Cholesterol (Calc): 97 mg/dL
Total CHOL/HDL Ratio: 3.6 (calc)
Triglycerides: 197 mg/dL — ABNORMAL HIGH

## 2024-09-21 LAB — TSH: TSH: 3.73 m[IU]/L (ref 0.40–4.50)

## 2024-09-22 ENCOUNTER — Other Ambulatory Visit: Payer: Self-pay | Admitting: Family Medicine

## 2024-09-22 DIAGNOSIS — N1832 Chronic kidney disease, stage 3b: Secondary | ICD-10-CM

## 2024-09-23 ENCOUNTER — Telehealth: Admitting: Behavioral Health

## 2024-09-23 ENCOUNTER — Other Ambulatory Visit: Payer: Self-pay | Admitting: Family Medicine

## 2024-09-23 DIAGNOSIS — N1832 Chronic kidney disease, stage 3b: Secondary | ICD-10-CM

## 2024-09-23 MED ORDER — ACCU-CHEK SOFTCLIX LANCETS MISC: 2 refills | Status: AC

## 2024-09-30 ENCOUNTER — Telehealth: Payer: Self-pay | Admitting: Behavioral Health

## 2024-09-30 ENCOUNTER — Other Ambulatory Visit: Payer: Self-pay | Admitting: Behavioral Health

## 2024-09-30 DIAGNOSIS — F411 Generalized anxiety disorder: Secondary | ICD-10-CM

## 2024-09-30 DIAGNOSIS — F5105 Insomnia due to other mental disorder: Secondary | ICD-10-CM

## 2024-09-30 NOTE — Telephone Encounter (Signed)
 Appt 1/22    Pt needs rf of Ambien  and Ativan     [facility] on [address]

## 2024-09-30 NOTE — Telephone Encounter (Signed)
 Pended both Ambien  and Ativan .

## 2024-10-02 ENCOUNTER — Ambulatory Visit: Admitting: Neurology

## 2024-10-03 ENCOUNTER — Telehealth (INDEPENDENT_AMBULATORY_CARE_PROVIDER_SITE_OTHER): Admitting: Behavioral Health

## 2024-10-03 ENCOUNTER — Encounter: Payer: Self-pay | Admitting: Behavioral Health

## 2024-10-03 DIAGNOSIS — F3131 Bipolar disorder, current episode depressed, mild: Secondary | ICD-10-CM

## 2024-10-03 DIAGNOSIS — F5105 Insomnia due to other mental disorder: Secondary | ICD-10-CM

## 2024-10-03 DIAGNOSIS — F99 Mental disorder, not otherwise specified: Secondary | ICD-10-CM | POA: Diagnosis not present

## 2024-10-03 DIAGNOSIS — F411 Generalized anxiety disorder: Secondary | ICD-10-CM | POA: Diagnosis not present

## 2024-10-03 MED ORDER — BUSPIRONE HCL 15 MG PO TABS: 15.0000 mg | ORAL_TABLET | Freq: Two times a day (BID) | ORAL | 5 refills | Status: AC

## 2024-10-03 MED ORDER — QUETIAPINE FUMARATE 200 MG PO TABS: 200.0000 mg | ORAL_TABLET | Freq: Every day | ORAL | 1 refills | Status: AC

## 2024-10-03 MED ORDER — ZOLPIDEM TARTRATE ER 12.5 MG PO TBCR: 12.5000 mg | EXTENDED_RELEASE_TABLET | Freq: Every evening | ORAL | 5 refills | Status: AC | PRN

## 2024-10-03 MED ORDER — LORAZEPAM 1 MG PO TABS: ORAL_TABLET | ORAL | 5 refills | Status: AC

## 2024-10-03 NOTE — Progress Notes (Signed)
 Sue [name] [id number] Jan 14, 1951 74 y.o.  Virtual Visit via Video Note  I connected with pt @ on 10/03/24 at  2:30 PM EST by a video enabled telemedicine application and verified that I am speaking with the correct person using two identifiers.   I discussed the limitations of evaluation and management by telemedicine and the availability of in person appointments. The patient expressed understanding and agreed to proceed.  I discussed the assessment and treatment plan with the patient. The patient was provided an opportunity to ask questions and all were answered. The patient agreed with the plan and demonstrated an understanding of the instructions.   The patient was advised to call back or seek an in-person evaluation if the symptoms worsen or if the condition fails to improve as anticipated.  I provided 30 minutes of non-face-to-face time during this encounter.  The patient was located at home.  The provider was located at [facility].   Sue DELENA Pizza, NP   Subjective:   Patient ID:  Sue [name] is a 74 y.o. (DOB [birth year]) female.  Chief Complaint:  Chief Complaint  Patient presents with   Depression   Anxiety   Follow-up   Medication Refill   Patient Education   Fatigue   Stress   Family Problem    HPI  Sue [name], 74 year old female presents to this office via video visit for follow up and medication management.  No psychosocial changes this visit.  Says that she is doing okay but experiencing some stressful family dynamics.  Poor relationships with some family members.  Following up with PCP and specialist regularly.  Reports medications are working well.  She does not want to change or adjust medications today.  Just needing refills.  Currently her anxiety level is 4/10 and depression is 3/10 but says that health issues keep her at this level.  She denies having any problems taking this medication. Denies any current mania which she said was dominate when  she was younger. No psychosis, no SI/HI. She has strong family hx of bipolar and schizophrenia on fathers side.    Review of Systems:  Review of Systems  Constitutional:  Positive for fatigue.  Respiratory:  Positive for shortness of breath.   Genitourinary:  Positive for urgency.  Neurological:  Positive for dizziness.  Psychiatric/Behavioral:  Positive for dysphoric mood and sleep disturbance. The patient is nervous/anxious.     Medications: I have reviewed the patient's current medications.  Current Outpatient Medications  Medication Sig Dispense Refill   Accu-Chek Softclix Lancets lancets Check glucose bid for diabetes 100 each 2   aspirin  EC 81 MG tablet Take 1 tablet (81 mg total) by mouth daily. Swallow whole. 30 tablet 12   Blood Glucose Monitoring Suppl (ACCU-CHEK GUIDE) w/Device KIT USE   TO CHECK GLUCOSE UP TO 4 TIMES DAILY AS DIRECTED 1 kit 0   busPIRone  (BUSPAR ) 15 MG tablet Take 1 tablet (15 mg total) by mouth 2 (two) times daily. 60 tablet 5   clotrimazole -betamethasone  (LOTRISONE ) cream Apply 1 Application topically daily. 30 g 3   Dulaglutide  (TRULICITY ) 3 MG/0.5ML SOAJ Inject 3 mg into the skin once a week. 6 mL 3   glucose blood (ACCU-CHEK GUIDE TEST) test strip USE 1  4 TIMES DAILY 200 each 12   insulin  aspart (NOVOLOG  FLEXPEN) 100 UNIT/ML FlexPen Novolog  10 units with each meal PLUS the scale if needed -Novolog  correctional insulin : ADD extra units on insulin  to your meal-time Novolog  dose if  your blood sugars are higher than 160. Use the scale below to help guide you:  Blood sugar before meal Number of units to inject Less than 160 0 unit 161 -  190 1 units 191 -  220 2 units 221 -  250 3 units 251 -  280 4 units 281 -  310 5 units 311 -  340 6 units 341 -  370 7 units 371 -  400 8 units Max daily 45 units 45 mL 3   insulin  glargine, 2 Unit Dial, (TOUJEO  MAX SOLOSTAR) 300 UNIT/ML Solostar Pen Inject 40 Units into the skin daily. 6 mL 3   Insulin  Pen Needle (BD PEN NEEDLE  NANO U/F) 32G X 4 MM MISC Use to inject insulin  200 each 3   levothyroxine  (SYNTHROID ) 100 MCG tablet Take 1 tablet (100 mcg total) by mouth daily before breakfast. 90 tablet 3   lidocaine  (LIDODERM ) 5 % Apply 1 patch to area of pain for up to 12 hours. Remove & Discard patch within 12 hours. May repeat once daily. 30 patch 0   LORazepam  (ATIVAN ) 1 MG tablet TAKE 0.5 TO 1.5 TABLETS AT BEDTIME AND AN ADDITIONAL 0.5 TAB ONCE A DAY AS NEEDED FOR ANXIETY 60 tablet 5   metoprolol  succinate (TOPROL -XL) 100 MG 24 hr tablet Take 1 tablet (100 mg total) by mouth daily. Take with or immediately following a meal. 90 tablet 3   NONFORMULARY OR COMPOUNDED ITEM Beside commode #1  frequent falls, weakness 1 each 0   NONFORMULARY OR COMPOUNDED ITEM oxygen  concentrator for oxygen  1 each 0   oseltamivir  (TAMIFLU ) 75 MG capsule Take 1 capsule (75 mg total) by mouth 2 (two) times daily. 10 capsule 0   OXYGEN  Inhale 3-4 L/min into the lungs continuous.      pantoprazole  (PROTONIX ) 40 MG tablet Take 1 tablet (40 mg total) by mouth 2 (two) times daily. 60 tablet 1   potassium chloride  SA (KLOR-CON  M) 20 MEQ tablet Take 2 tablets (40 mEq total) by mouth daily. 180 tablet 0   pregabalin  (LYRICA ) 100 MG capsule Take 1 capsule (100 mg total) by mouth 2 (two) times daily. 60 capsule 5   QUEtiapine  (SEROQUEL ) 200 MG tablet Take 1 tablet (200 mg total) by mouth at bedtime. 90 tablet 1   rosuvastatin  (CRESTOR ) 10 MG tablet Take 1 tablet by mouth once daily 90 tablet 0   senna-docusate (SENOKOT-S) 8.6-50 MG tablet Take 1 tablet by mouth daily. 30 tablet 0   torsemide  (DEMADEX ) 20 MG tablet Take 2 tablets (40 mg total) by mouth every evening. 180 tablet 0   zolpidem  (AMBIEN  CR) 12.5 MG CR tablet Take 1 tablet (12.5 mg total) by mouth at bedtime as needed. for sleep 30 tablet 5   No current facility-administered medications for this visit.    Medication Side Effects: None  Allergies: Allergies[1]  Past Medical History:   Diagnosis Date   Allergic rhinitis    Depression    Diabetes mellitus type 2, uncontrolled    Emphysema of lung (HCC)    3L home O2   GERD (gastroesophageal reflux disease)    Hypertension    Hypothyroidism    Obesity, morbid, BMI 50 or higher (HCC)    Stroke (HCC) 2016   TIA    Urine incontinence     Family History  Problem Relation Age of Onset   Heart disease Father        MVP and Pics Valve   Hypertension Father  Depression Father        Institutionalized x's 2 years   Bipolar disorder Father    Hypertension Sister    Diabetes Sister    Hyperlipidemia Sister    Heart disease Sister 59       MI   Heart disease Brother    Hypertension Brother    Schizophrenia Paternal Aunt    Depression Paternal Aunt    Anxiety disorder Paternal Aunt    Heart disease Paternal Aunt    Schizophrenia Paternal Aunt    Heart disease Paternal Uncle    Heart disease Paternal Grandmother    Asthma Son    Asthma Son     Social History   Socioeconomic History   Marital status: Divorced    Spouse name: Not on file   Number of children: 4   Years of education: 14   Highest education level: Some college, no degree  Occupational History   Occupation: disabled- Biochemist, Clinical Med  Tobacco Use   Smoking status: Former    Current packs/day: 0.00    Average packs/day: 1 pack/day for 40.0 years (40.0 ttl pk-yrs)    Types: Cigarettes    Start date: 07/21/1972    Quit date: 10/16/2011    Years since quitting: 12.9   Smokeless tobacco: Never  Vaping Use   Vaping status: Never Used  Substance and Sexual Activity   Alcohol  use: Not Currently    Comment: Occ-- Wine   Drug use: No   Sexual activity: Not Currently  Other Topics Concern   Not on file  Social History Narrative      Originally from [place]. Has also lived in [place], [place]. She also previously lived in [place] for 20 years. No history of Valley Fever. Moved to [place] in 1989. 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 No pets currently. No bird exposure. Questionable previous mold exposure in her daughter's home. 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       Lives in [place] alone in assisted living.  Has multiple chronic illness.      Are you right handed or left handed? Right handed   Are you currently employed ? no   What is your current occupation? retired   Do you live at home alone?no   Who lives with you? son   What type of home do you live in: 1 story or 2 story? Apartment first floor       Social Drivers of Health   Tobacco Use: Medium Risk (09/20/2024)   Patient History    Smoking Tobacco Use: Former    Smokeless Tobacco Use: Never    Passive Exposure: Not on file  Financial Resource Strain: Low Risk (08/19/2024)   Overall Financial Resource Strain (CARDIA)    Difficulty of Paying Living Expenses: Not very hard  Food Insecurity: No Food Insecurity (08/19/2024)   Epic    Worried About Programme Researcher, Broadcasting/film/video in the Last Year: Never true    Ran Out of Food in the Last Year: Never true  Transportation Needs: No Transportation Needs (08/19/2024)   Epic    Lack of Transportation (Medical): No    Lack of Transportation (Non-Medical): No  Physical Activity: Inactive (08/19/2024)   Exercise Vital Sign  Days of Exercise per Week: 0 days    Minutes of Exercise per Session: Not on file  Stress: No Stress Concern Present (08/19/2024)   [employer] of Occupational Health - Occupational Stress Questionnaire    Feeling of Stress: Only a little  Social Connections: Socially Isolated (08/19/2024)   Social Connection and Isolation Panel    Frequency of Communication with Friends and Family: More than three times a week    Frequency of Social Gatherings with Friends and Family: Once a  week    Attends Religious Services: Never    Database Administrator or Organizations: No    Attends Engineer, Structural: Not on file    Marital Status: Divorced  Intimate Partner Violence: Not At Risk (04/23/2024)   Epic    Fear of Current or Ex-Partner: No    Emotionally Abused: No    Physically Abused: No    Sexually Abused: No  Depression (PHQ2-9): High Risk (09/20/2024)   Depression (PHQ2-9)    PHQ-2 Score: 14  Alcohol  Screen: Low Risk (04/23/2024)   Alcohol  Screen    Last Alcohol  Screening Score (AUDIT): 0  Housing: Low Risk (08/19/2024)   Epic    Unable to Pay for Housing in the Last Year: No    Number of Times Moved in the Last Year: 0    Homeless in the Last Year: No  Utilities: Not At Risk (04/23/2024)   Epic    Threatened with loss of utilities: No  Health Literacy: Adequate Health Literacy (04/23/2024)   B1300 Health Literacy    Frequency of need for help with medical instructions: Never    Past Medical History, Surgical history, Social history, and Family history were reviewed and updated as appropriate.   Please see review of systems for further details on the patient's review from today.   Objective:   Physical Exam:  There were no vitals taken for this visit.  Physical Exam Neurological:     Mental Status: She is alert and oriented to person, place, and time.  Psychiatric:        Attention and Perception: Attention and perception normal.        Mood and Affect: Mood normal.        Speech: Speech normal.        Behavior: Behavior normal. Behavior is cooperative.        Cognition and Memory: Cognition and memory normal.        Judgment: Judgment normal.     Comments: Insight intact     Lab Review:     Component Value Date/Time   NA 139 09/20/2024 1546   NA 143 02/06/2024 0944   K 3.4 (L) 09/20/2024 1546   CL 100 09/20/2024 1546   CO2 31 09/20/2024 1546   GLUCOSE 187 (H) 09/20/2024 1546   BUN 19 09/20/2024 1546   BUN 12 02/06/2024 0944    CREATININE 1.65 (H) 09/20/2024 1546   CALCIUM  8.6 09/20/2024 1546   PROT 7.7 09/20/2024 1546   ALBUMIN  2.8 (L) 12/31/2023 1706   AST 24 09/20/2024 1546   ALT 12 09/20/2024 1546   ALKPHOS 107 12/31/2023 1706   BILITOT 0.4 09/20/2024 1546   GFRNONAA 42 (L) 01/08/2024 0328   GFRAA 31 (L) 01/01/2020 0951       Component Value Date/Time   WBC 5.5 09/20/2024 1546   RBC 3.57 (L) 09/20/2024 1546   HGB 11.0 (L) 09/20/2024 1546   HGB 12.1 02/06/2024 0944   HCT 35.1 (  L) 09/20/2024 1546   HCT 39.0 02/06/2024 0944   PLT 183 09/20/2024 1546   PLT 176 02/06/2024 0944   MCV 98.3 09/20/2024 1546   MCV 98 (H) 02/06/2024 0944   MCH 30.8 09/20/2024 1546   MCHC 31.3 (L) 09/20/2024 1546   RDW 15.0 09/20/2024 1546   RDW 14.5 02/06/2024 0944   LYMPHSABS 1.6 01/02/2024 0642   MONOABS 0.6 01/02/2024 0642   EOSABS 242 09/20/2024 1546   BASOSABS 28 09/20/2024 1546    No results found for: POCLITH, LITHIUM   No results found for: PHENYTOIN, PHENOBARB, VALPROATE, CBMZ   .res Assessment: Plan:   Recommendations/Plan   Greater than 50% of  30  min  video visit  with patient was spent on counseling and coordination of care.We reviewed her currently level of stability.  Discussed her poor family dynamics with some family members.  She is disappointed in relationships.  Would like reconciliation.  I reviewed her medications today and I feel that the benefit outweighs risk when considering her chronic  health concerns. I feel that she would progressively decline if her goals for anxiety and sleep are not met.  She expresses understanding of this risk primarily for falls.     Today we agreed to: Continue Zolpidem  12.5 mg CR Continue Buspar  15 mg twice daily Continue Ativan  0.5-1.5 tablets  (1 mg 0.5-1.5 mg total) by mouth. Take 1-1.5 mg at bedtime and an additional 0.5 mg once a day needed for anxiety. Continue Seroquel  to 200 mg daily at bedtime To contact this office if worsening  symptoms Will follow up in 6 months to reassess. She is requesting video visit next time due to ambulation and transportation issues. Provided emergency contact information Reviewed PDMP        Darice was seen today for depression, anxiety, follow-up, medication refill, patient education, fatigue, stress and family problem.  Diagnoses and all orders for this visit:  Bipolar affective disorder, currently depressed, mild (HCC)  Generalized anxiety disorder -     LORazepam  (ATIVAN ) 1 MG tablet; TAKE 0.5 TO 1.5 TABLETS AT BEDTIME AND AN ADDITIONAL 0.5 TAB ONCE A DAY AS NEEDED FOR ANXIETY -     busPIRone  (BUSPAR ) 15 MG tablet; Take 1 tablet (15 mg total) by mouth 2 (two) times daily. -     QUEtiapine  (SEROQUEL ) 200 MG tablet; Take 1 tablet (200 mg total) by mouth at bedtime.  Insomnia due to other mental disorder -     QUEtiapine  (SEROQUEL ) 200 MG tablet; Take 1 tablet (200 mg total) by mouth at bedtime. -     zolpidem  (AMBIEN  CR) 12.5 MG CR tablet; Take 1 tablet (12.5 mg total) by mouth at bedtime as needed. for sleep     Please see After Visit Summary for patient specific instructions.  Future Appointments  Date Time Provider Department Center  04/29/2025  8:10 AM [facility] VISIT 2 [facility] [address]  09/10/2025  2:30 PM Leigh Venetia CROME, MD [facility] None    No orders of the defined types were placed in this encounter.     -------------------------------     [1]  Allergies Allergen Reactions   Hydrocodone  Other (See Comments)    Constipation and hallucinations   Norvasc  [Amlodipine  Besylate] Swelling and Other (See Comments)    Marked swelling of the limbs   Tizanidine  Other (See Comments)    After the 3rd dose, the patient's mouth began to feel numb and she felt like she was a having a hot  flash

## 2024-10-15 ENCOUNTER — Other Ambulatory Visit: Payer: Self-pay | Admitting: Family Medicine

## 2024-10-15 DIAGNOSIS — R600 Localized edema: Secondary | ICD-10-CM

## 2024-10-17 ENCOUNTER — Other Ambulatory Visit: Payer: Self-pay | Admitting: Family Medicine

## 2024-10-17 DIAGNOSIS — R600 Localized edema: Secondary | ICD-10-CM

## 2025-04-29 ENCOUNTER — Ambulatory Visit

## 2025-09-10 ENCOUNTER — Ambulatory Visit: Payer: Self-pay | Admitting: Neurology
